# Patient Record
Sex: Male | Born: 1945 | Race: Black or African American | Hispanic: No | Marital: Married | Smoking: Never smoker
Health system: Southern US, Community
[De-identification: ages and names within clinical notes are randomized; demographics above are authoritative.]

## PROBLEM LIST (undated history)

## (undated) DIAGNOSIS — I10 Essential (primary) hypertension: Secondary | ICD-10-CM

## (undated) DIAGNOSIS — E785 Hyperlipidemia, unspecified: Secondary | ICD-10-CM

## (undated) DIAGNOSIS — K635 Polyp of colon: Secondary | ICD-10-CM

## (undated) DIAGNOSIS — D649 Anemia, unspecified: Secondary | ICD-10-CM

## (undated) DIAGNOSIS — K259 Gastric ulcer, unspecified as acute or chronic, without hemorrhage or perforation: Secondary | ICD-10-CM

## (undated) DIAGNOSIS — H269 Unspecified cataract: Secondary | ICD-10-CM

## (undated) DIAGNOSIS — Z5189 Encounter for other specified aftercare: Secondary | ICD-10-CM

## (undated) DIAGNOSIS — Z5111 Encounter for antineoplastic chemotherapy: Secondary | ICD-10-CM

## (undated) DIAGNOSIS — M5126 Other intervertebral disc displacement, lumbar region: Secondary | ICD-10-CM

## (undated) DIAGNOSIS — E119 Type 2 diabetes mellitus without complications: Secondary | ICD-10-CM

## (undated) DIAGNOSIS — M5136 Other intervertebral disc degeneration, lumbar region: Secondary | ICD-10-CM

## (undated) DIAGNOSIS — M51369 Other intervertebral disc degeneration, lumbar region without mention of lumbar back pain or lower extremity pain: Secondary | ICD-10-CM

## (undated) HISTORY — DX: Anemia, unspecified: D64.9

## (undated) HISTORY — DX: Other intervertebral disc displacement, lumbar region: M51.26

## (undated) HISTORY — PX: LIMBAL STEM CELL TRANSPLANT: SHX1969

## (undated) HISTORY — DX: Gastric ulcer, unspecified as acute or chronic, without hemorrhage or perforation: K25.9

## (undated) HISTORY — DX: Polyp of colon: K63.5

## (undated) HISTORY — DX: Encounter for other specified aftercare: Z51.89

## (undated) HISTORY — DX: Other intervertebral disc degeneration, lumbar region without mention of lumbar back pain or lower extremity pain: M51.369

## (undated) HISTORY — DX: Other intervertebral disc degeneration, lumbar region: M51.36

## (undated) HISTORY — DX: Hyperlipidemia, unspecified: E78.5

## (undated) HISTORY — DX: Encounter for antineoplastic chemotherapy: Z51.11

## (undated) HISTORY — DX: Unspecified cataract: H26.9

## (undated) HISTORY — DX: Essential (primary) hypertension: I10

## (undated) HISTORY — PX: CATARACT EXTRACTION W/ INTRAOCULAR LENS IMPLANT: SHX1309

## (undated) HISTORY — DX: Type 2 diabetes mellitus without complications: E11.9

---

## 2008-03-30 ENCOUNTER — Emergency Department (HOSPITAL_BASED_OUTPATIENT_CLINIC_OR_DEPARTMENT_OTHER): Admission: EM | Admit: 2008-03-30 | Discharge: 2008-03-30 | Payer: Self-pay | Admitting: Emergency Medicine

## 2009-01-20 HISTORY — PX: HUMERUS FRACTURE SURGERY: SHX670

## 2010-01-12 ENCOUNTER — Observation Stay (HOSPITAL_COMMUNITY)
Admission: EM | Admit: 2010-01-12 | Discharge: 2010-01-14 | Payer: Self-pay | Source: Home / Self Care | Attending: Internal Medicine | Admitting: Internal Medicine

## 2010-01-15 ENCOUNTER — Ambulatory Visit (HOSPITAL_BASED_OUTPATIENT_CLINIC_OR_DEPARTMENT_OTHER): Payer: Federal, State, Local not specified - PPO | Admitting: Internal Medicine

## 2010-01-17 ENCOUNTER — Inpatient Hospital Stay (HOSPITAL_COMMUNITY)
Admission: RE | Admit: 2010-01-17 | Discharge: 2010-01-19 | Payer: Self-pay | Source: Home / Self Care | Attending: Orthopedic Surgery | Admitting: Orthopedic Surgery

## 2010-01-18 ENCOUNTER — Encounter (INDEPENDENT_AMBULATORY_CARE_PROVIDER_SITE_OTHER): Payer: Self-pay | Admitting: Orthopedic Surgery

## 2010-01-20 DIAGNOSIS — K635 Polyp of colon: Secondary | ICD-10-CM

## 2010-01-20 HISTORY — DX: Polyp of colon: K63.5

## 2010-01-20 HISTORY — PX: LIMBAL STEM CELL TRANSPLANT: SHX1969

## 2010-01-22 LAB — CBC WITH DIFFERENTIAL/PLATELET
Basophils Absolute: 0 10*3/uL (ref 0.0–0.1)
EOS%: 1 % (ref 0.0–7.0)
Eosinophils Absolute: 0.1 10*3/uL (ref 0.0–0.5)
LYMPH%: 29.1 % (ref 14.0–49.0)
MCHC: 31.8 g/dL — ABNORMAL LOW (ref 32.0–36.0)
MCV: 76.6 fL — ABNORMAL LOW (ref 79.3–98.0)
MONO#: 0.6 10*3/uL (ref 0.1–0.9)
MONO%: 8.5 % (ref 0.0–14.0)
NEUT%: 61.3 % (ref 39.0–75.0)
Platelets: 216 10*3/uL (ref 140–400)

## 2010-01-23 LAB — COMPREHENSIVE METABOLIC PANEL
ALT: 18 U/L (ref 0–53)
AST: 20 U/L (ref 0–37)
Albumin: 2.9 g/dL — ABNORMAL LOW (ref 3.5–5.2)
Alkaline Phosphatase: 61 U/L (ref 39–117)
BUN: 14 mg/dL (ref 6–23)
CO2: 29 mEq/L (ref 19–32)
Calcium: 9.6 mg/dL (ref 8.4–10.5)
Chloride: 97 mEq/L (ref 96–112)
Creatinine, Ser: 1.06 mg/dL (ref 0.40–1.50)
Glucose, Bld: 168 mg/dL — ABNORMAL HIGH (ref 70–99)
Potassium: 4 mEq/L (ref 3.5–5.3)
Sodium: 138 mEq/L (ref 135–145)
Total Bilirubin: 0.7 mg/dL (ref 0.3–1.2)
Total Protein: 7.9 g/dL (ref 6.0–8.3)

## 2010-01-23 LAB — KAPPA/LAMBDA LIGHT CHAINS
Kappa free light chain: <0.66 mg/dL (ref 0.33–1.94)
Lambda Free Lght Chn: 27.8 mg/dL — ABNORMAL HIGH (ref 0.57–2.63)

## 2010-01-23 LAB — LACTATE DEHYDROGENASE: LDH: 151 U/L (ref 94–250)

## 2010-01-23 LAB — IGG, IGA, IGM
IgA: 3580 mg/dL — ABNORMAL HIGH (ref 68–378)
IgG (Immunoglobin G), Serum: 396 mg/dL — ABNORMAL LOW (ref 694–1618)
IgM, Serum: <4 mg/dL — ABNORMAL LOW (ref 60–263)

## 2010-01-23 LAB — BETA 2 MICROGLOBULIN, SERUM: Beta-2 Microglobulin: 5.23 mg/L — ABNORMAL HIGH (ref 1.01–1.73)

## 2010-01-25 ENCOUNTER — Other Ambulatory Visit
Admission: RE | Admit: 2010-01-25 | Discharge: 2010-01-25 | Payer: Self-pay | Source: Home / Self Care | Attending: Internal Medicine | Admitting: Internal Medicine

## 2010-01-25 LAB — CBC
HCT: 25 % — ABNORMAL LOW (ref 39.0–52.0)
Hemoglobin: 8 g/dL — ABNORMAL LOW (ref 13.0–17.0)
MCH: 25.2 pg — ABNORMAL LOW (ref 26.0–34.0)
MCHC: 32 g/dL (ref 30.0–36.0)
MCV: 78.9 fL (ref 78.0–100.0)
Platelets: 237 10*3/uL (ref 150–400)
RBC: 3.17 MIL/uL — ABNORMAL LOW (ref 4.22–5.81)
RDW: 17.9 % — ABNORMAL HIGH (ref 11.5–15.5)
WBC: 7.5 10*3/uL (ref 4.0–10.5)

## 2010-01-25 LAB — DIFFERENTIAL
Basophils Absolute: 0 10*3/uL (ref 0.0–0.1)
Basophils Relative: 0 % (ref 0–1)
Eosinophils Absolute: 0.1 10*3/uL (ref 0.0–0.7)
Eosinophils Relative: 1 % (ref 0–5)
Lymphocytes Relative: 34 % (ref 12–46)
Lymphs Abs: 2.5 10*3/uL (ref 0.7–4.0)
Monocytes Absolute: 0.8 10*3/uL (ref 0.1–1.0)
Monocytes Relative: 10 % (ref 3–12)
Neutro Abs: 4.1 10*3/uL (ref 1.7–7.7)
Neutrophils Relative %: 54 % (ref 43–77)

## 2010-01-29 LAB — UIFE/LIGHT CHAINS/TP QN, 24-HR UR
Albumin, U: DETECTED
Alpha 1, Urine: DETECTED — AB
Alpha 2, Urine: DETECTED — AB
Beta, Urine: DETECTED — AB
Free Kappa Lt Chains,Ur: 2.24 mg/dL — ABNORMAL HIGH (ref 0.04–1.51)
Free Kappa/Lambda Ratio: 0.02 ratio — ABNORMAL LOW (ref 0.46–4.00)
Free Lambda Excretion/Day: 1711.8 mg/d
Free Lambda Lt Chains,Ur: 95.1 mg/dL — ABNORMAL HIGH (ref 0.08–1.01)
Free Lt Chn Excr Rate: 40.32 mg/d
Gamma Globulin, Urine: DETECTED — AB
Time: 24 hours
Total Protein, Urine-Ur/day: 1762 mg/d — ABNORMAL HIGH (ref 10–140)
Total Protein, Urine: 97.9 mg/dL
Volume, Urine: 1800 mL

## 2010-01-31 LAB — CBC WITH DIFFERENTIAL/PLATELET
BASO%: 0.3 % (ref 0.0–2.0)
Basophils Absolute: 0 10*3/uL (ref 0.0–0.1)
EOS%: 0.3 % (ref 0.0–7.0)
Eosinophils Absolute: 0 10*3/uL (ref 0.0–0.5)
HCT: 26.9 % — ABNORMAL LOW (ref 38.4–49.9)
HGB: 8.5 g/dL — ABNORMAL LOW (ref 13.0–17.1)
LYMPH%: 25.4 % (ref 14.0–49.0)
MCH: 24.6 pg — ABNORMAL LOW (ref 27.2–33.4)
MCHC: 31.7 g/dL — ABNORMAL LOW (ref 32.0–36.0)
MCV: 77.8 fL — ABNORMAL LOW (ref 79.3–98.0)
MONO#: 0.4 10*3/uL (ref 0.1–0.9)
MONO%: 7.4 % (ref 0.0–14.0)
NEUT#: 4 10*3/uL (ref 1.5–6.5)
NEUT%: 66.6 % (ref 39.0–75.0)
Platelets: 305 10*3/uL (ref 140–400)
RBC: 3.46 10*6/uL — ABNORMAL LOW (ref 4.20–5.82)
RDW: 18.5 % — ABNORMAL HIGH (ref 11.0–14.6)
WBC: 6 10*3/uL (ref 4.0–10.3)
lymph#: 1.5 10*3/uL (ref 0.9–3.3)

## 2010-01-31 LAB — COMPREHENSIVE METABOLIC PANEL
ALT: 11 U/L (ref 0–53)
AST: 17 U/L (ref 0–37)
Albumin: 3.4 g/dL — ABNORMAL LOW (ref 3.5–5.2)
Alkaline Phosphatase: 82 U/L (ref 39–117)
BUN: 14 mg/dL (ref 6–23)
CO2: 29 mEq/L (ref 19–32)
Calcium: 12.9 mg/dL — ABNORMAL HIGH (ref 8.4–10.5)
Chloride: 99 mEq/L (ref 96–112)
Creatinine, Ser: 1.12 mg/dL (ref 0.40–1.50)
Glucose, Bld: 134 mg/dL — ABNORMAL HIGH (ref 70–99)
Potassium: 4.3 mEq/L (ref 3.5–5.3)
Sodium: 141 mEq/L (ref 135–145)
Total Bilirubin: 1 mg/dL (ref 0.3–1.2)
Total Protein: 9.5 g/dL — ABNORMAL HIGH (ref 6.0–8.3)

## 2010-01-31 LAB — LACTATE DEHYDROGENASE: LDH: 159 U/L (ref 94–250)

## 2010-02-04 ENCOUNTER — Encounter
Admission: AD | Admit: 2010-02-04 | Discharge: 2010-02-04 | Payer: Self-pay | Source: Home / Self Care | Attending: Dentistry | Admitting: Dentistry

## 2010-02-08 LAB — CBC WITH DIFFERENTIAL/PLATELET
BASO%: 0 % (ref 0.0–2.0)
Basophils Absolute: 0 10*3/uL (ref 0.0–0.1)
EOS%: 0.4 % (ref 0.0–7.0)
Eosinophils Absolute: 0 10*3/uL (ref 0.0–0.5)
HCT: 25.7 % — ABNORMAL LOW (ref 38.4–49.9)
HGB: 8.2 g/dL — ABNORMAL LOW (ref 13.0–17.1)
LYMPH%: 33.1 % (ref 14.0–49.0)
MCH: 24.2 pg — ABNORMAL LOW (ref 27.2–33.4)
MCHC: 31.9 g/dL — ABNORMAL LOW (ref 32.0–36.0)
MCV: 75.8 fL — ABNORMAL LOW (ref 79.3–98.0)
MONO#: 0.4 10*3/uL (ref 0.1–0.9)
MONO%: 7.3 % (ref 0.0–14.0)
NEUT#: 3.3 10*3/uL (ref 1.5–6.5)
NEUT%: 59.2 % (ref 39.0–75.0)
Platelets: 196 10*3/uL (ref 140–400)
RBC: 3.39 10*6/uL — ABNORMAL LOW (ref 4.20–5.82)
RDW: 18.1 % — ABNORMAL HIGH (ref 11.0–14.6)
WBC: 5.6 10*3/uL (ref 4.0–10.3)
lymph#: 1.9 10*3/uL (ref 0.9–3.3)

## 2010-02-15 NOTE — Op Note (Signed)
NAME:  Eddie Thomas, Eddie Thomas NO.:  [id number]  MEDICAL RECORD NO.:  [id number]          PATIENT TYPE:  INP  LOCATION:  5008                         FACILITY:  [facility]  PHYSICIAN:  Vania Rea. Alba Perillo, M.D.  DATE OF BIRTH:  [birth year]  DATE OF PROCEDURE:  01/17/2010 DATE OF DISCHARGE:                              OPERATIVE REPORT   PREOPERATIVE DIAGNOSIS:  Pathologic right proximal humerus fracture.  POSTOPERATIVE DIAGNOSIS:  Pathologic right proximal humerus fracture.  PROCEDURES: 1. Open reduction and internal fixation of pathological right proximal     humerus fracture. 2. Curettage and cement filling of bone defect, right proximal     humerus.  SURGEON:  Vania Rea. Raymonde Hamblin, MD  ASSISTANT:  Lucita Lora. Shuford, PA-C  ANESTHESIA:  General endotracheal as well as an interscalene block.  ESTIMATED BLOOD LOSS:  500 mL.  DRAINS:  Hemovac x1.  HISTORY:  Mr. Eddie Thomas is a 65 year old gentleman who sustained a pathologic fracture to the right proximal humerus this past Saturday. His injury occurred after trivial trauma to the shoulder and radiographs showed a large lytic lesion involving the proximal humeral metaphysis. Subsequent skeletal survey showed involvement of all long bones and changes consistent with multiple myeloma.  He is tentatively scheduled for evaluation and initial treatment with the Oncology Service early next week.  He is brought to the operating room at this time for planned surgical stabilization of his proximal humeral pathologic fracture.  Preoperatively, I counseled Mr. Eddie Thomas and his family on treatment options as well as risks versus benefits thereof.  Possible surgical complications were reviewed including potential for bleeding, infection, neurovascular injury, malunion, nonunion, failure of fixation, and possible need for additional surgery.  He understands and accepts and agrees with our planned procedure.  PROCEDURE IN DETAIL:  After  undergoing routine preop evaluation, the patient received prophylactic antibiotics.  An interscalene block was established in the holding area by the Anesthesia Department.  Placed supine on the operating table and underwent smooth induction of general endotracheal anesthesia.  The right shoulder girdle and right upper extremity was then sterilely prepped and draped in the standard fashion. Time-out was called.  An anterior and anterolateral approach to the shoulder and upper arm respectively was made beginning with an incision at the coracoid process and extending distally, total length approximately 20 cm.  Dissection was carried through skin and subcu tissue and electrocautery was used for hemostasis.  The deltopectoral interval was developed proximally with the cephalic vein retracted laterally with the deltoid.  Dissection was then carried distally.  The interval between the deltoid and pectoralis major developed distally and then onto the anterior aspect of the humerus where the biceps was retracted medially and the brachialis was identified and split in its midline along the anterior margin of the humeral shaft.  Subperiosteal dissection was then used to expose the humeral shaft for an appropriate distance distal to the pathologic fracture site.  The distal deltoid insertion was reflected posterolaterally.  The tumor was readily identified in the proximal humeral metaphyseal region with associated pathologic fracture.  Once we had complete exposure of the anterior and anterolateral aspects of  humerus both proximal and distal to the tumor and fracture, we obtained appropriate alignment and then selected the appropriate length Zimmer proximal humeral plate.  We confirmed this had appropriate length to span the bony defect and obtain appropriate purchase both proximally and distally.  We performed some initial transfixion with a single screw proximally and distally, confirmed overall  good alignment, then completed fixation with multiple locking screws into the humeral head proximally and then a combination of locking and regular lag screws distally obtaining a total of 12 cortices distally and 5 locking screws proximally into the humeral head.  We then created a bony window into the tumor anteriorly and this allowed [place] to threat out the tumor mass leaving a thin shell of residual bone.  Cement was then mixed and introduced into the bony void and hand packed it with fluoroscopic images used to confirm that the cement was maintained within the bony shell.  We then transfixed an additional 6 screws into the cement before it hardened and then obtained additional fixation with this technique.  Final fluoroscopic images showed the cement to be properly contained within the bony void of the tumor and all hardware was in good position and alignment.  The wound was then copiously irrigated.  Hemostasis was obtained.  A Hemovac drain was brought out distally and laterally.  The deltopectoral interval was then reapproximated with 0 Vicryl as was the distal portion of the incision. A 2-0 Vicryl was used for the subcu layer and intracuticular 3-0 Monocryl for the skin followed by Steri-Strips.  Dry dressing was applied over the right upper arm.  The right arm was placed in a sling. The patient was then awakened, extubated, and taken to recovery room in stable condition.     Vania Rea. Janica Eldred, M.D.     KMS/MEDQ  D:  01/17/2010  T:  01/18/2010  Job:  [id number]  Electronically Signed by Francena Hanly M.D. on 02/13/2010 07:43:48 AM

## 2010-02-20 ENCOUNTER — Ambulatory Visit (HOSPITAL_BASED_OUTPATIENT_CLINIC_OR_DEPARTMENT_OTHER): Payer: Federal, State, Local not specified - PPO | Admitting: Internal Medicine

## 2010-02-20 DIAGNOSIS — M949 Disorder of cartilage, unspecified: Secondary | ICD-10-CM

## 2010-02-20 DIAGNOSIS — C9000 Multiple myeloma not having achieved remission: Secondary | ICD-10-CM

## 2010-02-20 DIAGNOSIS — M899 Disorder of bone, unspecified: Secondary | ICD-10-CM

## 2010-02-20 DIAGNOSIS — D649 Anemia, unspecified: Secondary | ICD-10-CM

## 2010-02-20 LAB — CBC WITH DIFFERENTIAL/PLATELET
BASO%: 0 % (ref 0.0–2.0)
EOS%: 0.9 % (ref 0.0–7.0)
MCH: 24.5 pg — ABNORMAL LOW (ref 27.2–33.4)
MCHC: 32.6 g/dL (ref 32.0–36.0)
MCV: 75 fL — ABNORMAL LOW (ref 79.3–98.0)
NEUT#: 5.3 10*3/uL (ref 1.5–6.5)
RBC: 3.28 10*6/uL — ABNORMAL LOW (ref 4.20–5.82)
RDW: 19.4 % — ABNORMAL HIGH (ref 11.0–14.6)

## 2010-02-20 LAB — COMPREHENSIVE METABOLIC PANEL
Albumin: 3.7 g/dL (ref 3.5–5.2)
CO2: 24 mEq/L (ref 19–32)
Calcium: 7.8 mg/dL — ABNORMAL LOW (ref 8.4–10.5)
Chloride: 103 mEq/L (ref 96–112)
Glucose, Bld: 115 mg/dL — ABNORMAL HIGH (ref 70–99)
Potassium: 3.9 mEq/L (ref 3.5–5.3)
Sodium: 139 mEq/L (ref 135–145)

## 2010-03-06 ENCOUNTER — Other Ambulatory Visit: Payer: Self-pay | Admitting: Internal Medicine

## 2010-03-06 ENCOUNTER — Encounter (HOSPITAL_BASED_OUTPATIENT_CLINIC_OR_DEPARTMENT_OTHER): Payer: Federal, State, Local not specified - PPO | Admitting: Internal Medicine

## 2010-03-06 DIAGNOSIS — D649 Anemia, unspecified: Secondary | ICD-10-CM

## 2010-03-06 DIAGNOSIS — C9000 Multiple myeloma not having achieved remission: Secondary | ICD-10-CM

## 2010-03-06 DIAGNOSIS — M949 Disorder of cartilage, unspecified: Secondary | ICD-10-CM

## 2010-03-06 LAB — CBC WITH DIFFERENTIAL/PLATELET
Basophils Absolute: 0 10*3/uL (ref 0.0–0.1)
Eosinophils Absolute: 0.1 10*3/uL (ref 0.0–0.5)
HGB: 8.1 g/dL — ABNORMAL LOW (ref 13.0–17.1)
LYMPH%: 27.6 % (ref 14.0–49.0)
MCV: 72.8 fL — ABNORMAL LOW (ref 79.3–98.0)
MONO#: 0.6 10*3/uL (ref 0.1–0.9)
MONO%: 11.1 % (ref 0.0–14.0)
NEUT#: 3.4 10*3/uL (ref 1.5–6.5)
Platelets: 498 10*3/uL — ABNORMAL HIGH (ref 140–400)
RDW: 19.1 % — ABNORMAL HIGH (ref 11.0–14.6)
WBC: 5.6 10*3/uL (ref 4.0–10.3)

## 2010-03-06 LAB — COMPREHENSIVE METABOLIC PANEL
ALT: 13 U/L (ref 0–53)
BUN: 5 mg/dL — ABNORMAL LOW (ref 6–23)
CO2: 28 mEq/L (ref 19–32)
Creatinine, Ser: 0.7 mg/dL (ref 0.40–1.50)
Potassium: 3.8 mEq/L (ref 3.5–5.3)
Sodium: 142 mEq/L (ref 135–145)
Total Protein: 6.3 g/dL (ref 6.0–8.3)

## 2010-03-14 ENCOUNTER — Encounter (HOSPITAL_BASED_OUTPATIENT_CLINIC_OR_DEPARTMENT_OTHER): Payer: Federal, State, Local not specified - PPO | Admitting: Internal Medicine

## 2010-03-14 ENCOUNTER — Other Ambulatory Visit: Payer: Self-pay | Admitting: Internal Medicine

## 2010-03-14 DIAGNOSIS — C9000 Multiple myeloma not having achieved remission: Secondary | ICD-10-CM

## 2010-03-14 DIAGNOSIS — M949 Disorder of cartilage, unspecified: Secondary | ICD-10-CM

## 2010-03-14 DIAGNOSIS — M899 Disorder of bone, unspecified: Secondary | ICD-10-CM

## 2010-03-14 LAB — BASIC METABOLIC PANEL
BUN: 7 mg/dL (ref 6–23)
Calcium: 8.5 mg/dL (ref 8.4–10.5)
Chloride: 102 mEq/L (ref 96–112)
Creatinine, Ser: 0.78 mg/dL (ref 0.40–1.50)
Potassium: 4.2 mEq/L (ref 3.5–5.3)

## 2010-03-25 NOTE — Discharge Summary (Signed)
  NAME:  Eddie Thomas, Eddie Thomas NO.:  [id number]  MEDICAL RECORD NO.:  [id number]          PATIENT TYPE:  INP  LOCATION:  5008                         FACILITY:  [facility]  PHYSICIAN:  Vania Rea. Malina Geers, M.D.  DATE OF BIRTH:  11/30/1945  DATE OF ADMISSION:  01/17/2010 DATE OF DISCHARGE:  01/19/2010                              DISCHARGE SUMMARY   ADMISSION DIAGNOSES: 1. Pathologic fracture of right humerus. 2. Hypertension. 3. Non-insulin-dependant diabetes mellitus. 4. Hypercholesteremia.  DISCHARGE DIAGNOSES: 1. Status post open reduction and internal fixation of pathological     right proximal humerus fracture. 2. Probable multiple myeloma.  OPERATIONS:  Open reduction and internal fixation of right pathologic humerus fracture.  BRIEF HISTORY:  Eddie Thomas is a 64-year gentleman who sustained a pathologic fracture of right proximal humerus with minimal trauma. Radiograph showed lytic lesion of the right proximal humerus as well as subsequent skeletal survey shows involvement of all long bones consistent with multiple myeloma.  He was therefore scheduled for operative fixation of his right pathologic fracture as well as postoperative oncology referral.  HOSPITAL COURSE:  The patient was admitted and underwent the above-named procedure and tolerated this well.  All appropriate IV antibiotics and analgesia were utilized.  The patient did extremely well postoperatively, had initial postoperative pain as expected requiring additional stay in the hospital.  However, he remained neurovascularly intact.  By second day postoperatively, he was ambulating well.  He was tolerating oral pain meds and incision was clean and dry.  At this time, he was stable for discharge to home.  CONDITION ON DISCHARGE:  Stable, improved.  DISCHARGE MEDICATIONS AND PLANS:  The patient is being discharged to home.  He wears a sling at all times.  Follow up in our office in 2 weeks, call for  time.  Prescriptions for medications per med reconciliation are in the chart.  May allow arm to dangle.  Call for any other additional needs.  Resume other home meds and home diet.     Tracy A. Shuford, P.A.-C.   ______________________________ Vania Rea. Linzie Boursiquot, M.D.    TAS/MEDQ  D:  02/13/2010  T:  02/13/2010  Job:  [id number]  Electronically Signed by Ralene Bathe P.A.-C. on 03/06/2010 08:26:16 AM Electronically Signed by Francena Hanly M.D. on 03/25/2010 05:02:55 PM

## 2010-03-27 ENCOUNTER — Encounter (HOSPITAL_BASED_OUTPATIENT_CLINIC_OR_DEPARTMENT_OTHER): Payer: Federal, State, Local not specified - PPO | Admitting: Internal Medicine

## 2010-03-27 ENCOUNTER — Other Ambulatory Visit: Payer: Self-pay | Admitting: Internal Medicine

## 2010-03-27 DIAGNOSIS — C9000 Multiple myeloma not having achieved remission: Secondary | ICD-10-CM

## 2010-03-27 DIAGNOSIS — M899 Disorder of bone, unspecified: Secondary | ICD-10-CM

## 2010-03-27 LAB — CBC WITH DIFFERENTIAL/PLATELET
BASO%: 0.6 % (ref 0.0–2.0)
Basophils Absolute: 0 10*3/uL (ref 0.0–0.1)
EOS%: 5 % (ref 0.0–7.0)
Eosinophils Absolute: 0.4 10*3/uL (ref 0.0–0.5)
HGB: 9.7 g/dL — ABNORMAL LOW (ref 13.0–17.1)
MCHC: 32 g/dL (ref 32.0–36.0)
MCV: 73.1 fL — ABNORMAL LOW (ref 79.3–98.0)
MONO#: 0.9 10*3/uL (ref 0.1–0.9)
MONO%: 11.4 % (ref 0.0–14.0)
RDW: 19.1 % — ABNORMAL HIGH (ref 11.0–14.6)
lymph#: 1.2 10*3/uL (ref 0.9–3.3)

## 2010-03-28 LAB — COMPREHENSIVE METABOLIC PANEL
ALT: 12 U/L (ref 0–53)
AST: 11 U/L (ref 0–37)
Albumin: 4.2 g/dL (ref 3.5–5.2)
Alkaline Phosphatase: 138 U/L — ABNORMAL HIGH (ref 39–117)
Total Bilirubin: 1.8 mg/dL — ABNORMAL HIGH (ref 0.3–1.2)
Total Protein: 6.5 g/dL (ref 6.0–8.3)

## 2010-03-28 LAB — KAPPA/LAMBDA LIGHT CHAINS
Kappa free light chain: 0.61 mg/dL (ref 0.33–1.94)
Kappa:Lambda Ratio: 0.25 — ABNORMAL LOW (ref 0.26–1.65)
Lambda Free Lght Chn: 2.4 mg/dL (ref 0.57–2.63)

## 2010-03-28 LAB — IGG, IGA, IGM: IgG (Immunoglobin G), Serum: 635 mg/dL — ABNORMAL LOW (ref 694–1618)

## 2010-03-28 LAB — BETA 2 MICROGLOBULIN, SERUM: Beta-2 Microglobulin: 1.47 mg/L (ref 1.01–1.73)

## 2010-04-01 LAB — CROSSMATCH
ABO/RH(D): AB POS
ABO/RH(D): AB POS
Unit division: 0
Unit division: 0
Unit division: 0
Unit division: 0

## 2010-04-01 LAB — COMPREHENSIVE METABOLIC PANEL
ALT: 19 U/L (ref 0–53)
ALT: 23 U/L (ref 0–53)
AST: 31 U/L (ref 0–37)
AST: 28 U/L (ref 0–37)
Alkaline Phosphatase: 57 U/L (ref 39–117)
CO2: 28 mEq/L (ref 19–32)
Calcium: 8.8 mg/dL (ref 8.4–10.5)
Calcium: 9.9 mg/dL (ref 8.4–10.5)
Chloride: 102 mEq/L (ref 96–112)
Chloride: 99 mEq/L (ref 96–112)
Creatinine, Ser: 1 mg/dL (ref 0.4–1.5)
GFR calc Af Amer: >60 mL/min (ref >60)
GFR calc Af Amer: >60 mL/min (ref >60)
GFR calc non Af Amer: >60 mL/min (ref >60)
GFR calc non Af Amer: >60 mL/min (ref >60)
Glucose, Bld: 154 mg/dL — ABNORMAL HIGH (ref 70–99)
Glucose, Bld: 130 mg/dL — ABNORMAL HIGH (ref 70–99)
Sodium: 139 mEq/L (ref 135–145)
Total Bilirubin: 1.3 mg/dL — ABNORMAL HIGH (ref 0.3–1.2)

## 2010-04-01 LAB — GLUCOSE, CAPILLARY
Glucose-Capillary: 124 mg/dL — ABNORMAL HIGH (ref 70–99)
Glucose-Capillary: 133 mg/dL — ABNORMAL HIGH (ref 70–99)
Glucose-Capillary: 136 mg/dL — ABNORMAL HIGH (ref 70–99)
Glucose-Capillary: 175 mg/dL — ABNORMAL HIGH (ref 70–99)
Glucose-Capillary: 122 mg/dL — ABNORMAL HIGH (ref 70–99)
Glucose-Capillary: 140 mg/dL — ABNORMAL HIGH (ref 70–99)
Glucose-Capillary: 162 mg/dL — ABNORMAL HIGH (ref 70–99)
Glucose-Capillary: 98 mg/dL (ref 70–99)

## 2010-04-01 LAB — URINALYSIS, ROUTINE W REFLEX MICROSCOPIC
Bilirubin Urine: NEGATIVE
Glucose, UA: NEGATIVE mg/dL
Ketones, ur: NEGATIVE mg/dL
Leukocytes, UA: NEGATIVE
Protein, ur: 30 mg/dL — AB
pH: 6 (ref 5.0–8.0)

## 2010-04-01 LAB — CBC
HCT: 30.7 % — ABNORMAL LOW (ref 39.0–52.0)
HCT: 26.6 % — ABNORMAL LOW (ref 39.0–52.0)
HCT: 28.1 % — ABNORMAL LOW (ref 39.0–52.0)
HCT: 31.9 % — ABNORMAL LOW (ref 39.0–52.0)
Hemoglobin: 9.9 g/dL — ABNORMAL LOW (ref 13.0–17.0)
Hemoglobin: 10.5 g/dL — ABNORMAL LOW (ref 13.0–17.0)
Hemoglobin: 8.7 g/dL — ABNORMAL LOW (ref 13.0–17.0)
MCH: 23.1 pg — ABNORMAL LOW (ref 26.0–34.0)
MCH: 23.4 pg — ABNORMAL LOW (ref 26.0–34.0)
MCH: 24.3 pg — ABNORMAL LOW (ref 26.0–34.0)
MCH: 24.3 pg — ABNORMAL LOW (ref 26.0–34.0)
MCHC: 32.2 g/dL (ref 30.0–36.0)
MCHC: 32.4 g/dL (ref 30.0–36.0)
MCHC: 32.7 g/dL (ref 30.0–36.0)
MCHC: 32.9 g/dL (ref 30.0–36.0)
MCV: 75.2 fL — ABNORMAL LOW (ref 78.0–100.0)
MCV: 75.6 fL — ABNORMAL LOW (ref 78.0–100.0)
Platelets: 166 10*3/uL (ref 150–400)
Platelets: 182 10*3/uL (ref 150–400)
Platelets: 149 10*3/uL — ABNORMAL LOW (ref 150–400)
RBC: 2.99 MIL/uL — ABNORMAL LOW (ref 4.22–5.81)
RBC: 3.42 MIL/uL — ABNORMAL LOW (ref 4.22–5.81)
RBC: 4.32 MIL/uL (ref 4.22–5.81)
RDW: 15.4 % (ref 11.5–15.5)
RDW: 16.4 % — ABNORMAL HIGH (ref 11.5–15.5)
RDW: 17.2 % — ABNORMAL HIGH (ref 11.5–15.5)
WBC: 6.2 10*3/uL (ref 4.0–10.5)
WBC: 7.7 10*3/uL (ref 4.0–10.5)
WBC: 5.2 10*3/uL (ref 4.0–10.5)
WBC: 7.6 10*3/uL (ref 4.0–10.5)

## 2010-04-01 LAB — DIFFERENTIAL
Basophils Absolute: 0 10*3/uL (ref 0.0–0.1)
Eosinophils Absolute: 0 10*3/uL (ref 0.0–0.7)
Eosinophils Relative: 0 % (ref 0–5)
Lymphs Abs: 1.8 10*3/uL (ref 0.7–4.0)
Monocytes Absolute: 0.4 10*3/uL (ref 0.1–1.0)
Monocytes Relative: 5 % (ref 3–12)
Neutro Abs: 5.5 10*3/uL (ref 1.7–7.7)

## 2010-04-01 LAB — MULTIPLE MYELOMA PANEL, SERUM
Albumin ELP: 36 % — ABNORMAL LOW (ref 55.8–66.1)
Alpha-1-Globulin: 3.1 % (ref 2.9–4.9)
Alpha-2-Globulin: 8.1 % (ref 7.1–11.8)
Beta 2: 40 % — ABNORMAL HIGH (ref 3.2–6.5)
Beta Globulin: 4.3 % — ABNORMAL LOW (ref 4.7–7.2)
Gamma Globulin: 8.5 % — ABNORMAL LOW (ref 11.1–18.8)
IgA: 3680 mg/dL — ABNORMAL HIGH (ref 68–378)
IgG (Immunoglobin G), Serum: 415 mg/dL — ABNORMAL LOW (ref 694–1618)
IgM, Serum: 11 mg/dL — ABNORMAL LOW (ref 60–263)
M-Spike, %: 3.07 g/dL
Total Protein: 7.8 g/dL (ref 6.0–8.3)

## 2010-04-01 LAB — PROTIME-INR: Prothrombin Time: 14.1 seconds (ref 11.6–15.2)

## 2010-04-01 LAB — BASIC METABOLIC PANEL
BUN: 10 mg/dL (ref 6–23)
BUN: 9 mg/dL (ref 6–23)
CO2: 27 mEq/L (ref 19–32)
Calcium: 9 mg/dL (ref 8.4–10.5)
Calcium: 8.8 mg/dL (ref 8.4–10.5)
Creatinine, Ser: 0.93 mg/dL (ref 0.4–1.5)
Creatinine, Ser: 0.9 mg/dL (ref 0.4–1.5)
GFR calc Af Amer: >60 mL/min (ref >60)
GFR calc non Af Amer: >60 mL/min (ref >60)
Glucose, Bld: 152 mg/dL — ABNORMAL HIGH (ref 70–99)
Potassium: 4.4 mEq/L (ref 3.5–5.1)

## 2010-04-01 LAB — SURGICAL PCR SCREEN
MRSA, PCR: NEGATIVE
Staphylococcus aureus: NEGATIVE

## 2010-04-01 LAB — POCT I-STAT 4, (NA,K, GLUC, HGB,HCT)
HCT: 24 % — ABNORMAL LOW (ref 39.0–52.0)
Hemoglobin: 8.2 g/dL — ABNORMAL LOW (ref 13.0–17.0)
Sodium: 139 mEq/L (ref 135–145)

## 2010-04-01 LAB — URINE MICROSCOPIC-ADD ON

## 2010-04-01 LAB — ABO/RH: ABO/RH(D): AB POS

## 2010-04-01 LAB — APTT: aPTT: 29 seconds (ref 24–37)

## 2010-04-03 ENCOUNTER — Encounter (HOSPITAL_BASED_OUTPATIENT_CLINIC_OR_DEPARTMENT_OTHER): Payer: Federal, State, Local not specified - PPO | Admitting: Internal Medicine

## 2010-04-03 DIAGNOSIS — M899 Disorder of bone, unspecified: Secondary | ICD-10-CM

## 2010-04-03 DIAGNOSIS — C9000 Multiple myeloma not having achieved remission: Secondary | ICD-10-CM

## 2010-04-11 ENCOUNTER — Other Ambulatory Visit: Payer: Self-pay | Admitting: Internal Medicine

## 2010-04-11 ENCOUNTER — Encounter (HOSPITAL_BASED_OUTPATIENT_CLINIC_OR_DEPARTMENT_OTHER): Payer: Federal, State, Local not specified - PPO | Admitting: Internal Medicine

## 2010-04-11 DIAGNOSIS — C9000 Multiple myeloma not having achieved remission: Secondary | ICD-10-CM

## 2010-04-11 LAB — BASIC METABOLIC PANEL
Chloride: 103 mEq/L (ref 96–112)
Potassium: 3.6 mEq/L (ref 3.5–5.3)
Sodium: 138 mEq/L (ref 135–145)

## 2010-05-01 ENCOUNTER — Other Ambulatory Visit: Payer: Self-pay | Admitting: Internal Medicine

## 2010-05-01 ENCOUNTER — Encounter (HOSPITAL_BASED_OUTPATIENT_CLINIC_OR_DEPARTMENT_OTHER): Payer: Federal, State, Local not specified - PPO | Admitting: Internal Medicine

## 2010-05-01 DIAGNOSIS — R197 Diarrhea, unspecified: Secondary | ICD-10-CM

## 2010-05-01 DIAGNOSIS — C9000 Multiple myeloma not having achieved remission: Secondary | ICD-10-CM

## 2010-05-01 DIAGNOSIS — M899 Disorder of bone, unspecified: Secondary | ICD-10-CM

## 2010-05-01 LAB — CBC WITH DIFFERENTIAL/PLATELET
Basophils Absolute: 0 10*3/uL (ref 0.0–0.1)
Eosinophils Absolute: 0.2 10*3/uL (ref 0.0–0.5)
HCT: 30.1 % — ABNORMAL LOW (ref 38.4–49.9)
LYMPH%: 29.7 % (ref 14.0–49.0)
MCV: 69.8 fL — ABNORMAL LOW (ref 79.3–98.0)
MONO#: 1 10*3/uL — ABNORMAL HIGH (ref 0.1–0.9)
NEUT#: 3.2 10*3/uL (ref 1.5–6.5)
NEUT%: 50.6 % (ref 39.0–75.0)
Platelets: 217 10*3/uL (ref 140–400)
WBC: 6.2 10*3/uL (ref 4.0–10.3)

## 2010-05-01 LAB — COMPREHENSIVE METABOLIC PANEL
ALT: 14 U/L (ref 0–53)
BUN: 15 mg/dL (ref 6–23)
CO2: 26 mEq/L (ref 19–32)
Calcium: 9.4 mg/dL (ref 8.4–10.5)
Chloride: 105 mEq/L (ref 96–112)
Creatinine, Ser: 0.73 mg/dL (ref 0.40–1.50)
Glucose, Bld: 140 mg/dL — ABNORMAL HIGH (ref 70–99)

## 2010-05-01 LAB — LACTATE DEHYDROGENASE: LDH: 93 U/L — ABNORMAL LOW (ref 94–250)

## 2010-05-09 ENCOUNTER — Other Ambulatory Visit: Payer: Self-pay | Admitting: Internal Medicine

## 2010-05-09 ENCOUNTER — Encounter (HOSPITAL_BASED_OUTPATIENT_CLINIC_OR_DEPARTMENT_OTHER): Payer: Federal, State, Local not specified - PPO | Admitting: Internal Medicine

## 2010-05-09 DIAGNOSIS — C9000 Multiple myeloma not having achieved remission: Secondary | ICD-10-CM

## 2010-05-09 LAB — BASIC METABOLIC PANEL
Chloride: 103 mEq/L (ref 96–112)
Creatinine, Ser: 0.7 mg/dL (ref 0.40–1.50)
Potassium: 3.7 mEq/L (ref 3.5–5.3)
Sodium: 141 mEq/L (ref 135–145)

## 2010-05-22 ENCOUNTER — Other Ambulatory Visit: Payer: Self-pay | Admitting: Internal Medicine

## 2010-05-22 ENCOUNTER — Encounter (HOSPITAL_BASED_OUTPATIENT_CLINIC_OR_DEPARTMENT_OTHER): Payer: Federal, State, Local not specified - PPO | Admitting: Internal Medicine

## 2010-05-22 DIAGNOSIS — C9000 Multiple myeloma not having achieved remission: Secondary | ICD-10-CM

## 2010-05-22 DIAGNOSIS — M949 Disorder of cartilage, unspecified: Secondary | ICD-10-CM

## 2010-05-22 LAB — CBC WITH DIFFERENTIAL/PLATELET
Basophils Absolute: 0 10*3/uL (ref 0.0–0.1)
Eosinophils Absolute: 0.4 10*3/uL (ref 0.0–0.5)
HGB: 9.6 g/dL — ABNORMAL LOW (ref 13.0–17.1)
MCV: 71.8 fL — ABNORMAL LOW (ref 79.3–98.0)
MONO%: 15.8 % — ABNORMAL HIGH (ref 0.0–14.0)
NEUT#: 3.9 10*3/uL (ref 1.5–6.5)
RDW: 18.8 % — ABNORMAL HIGH (ref 11.0–14.6)

## 2010-05-23 LAB — COMPREHENSIVE METABOLIC PANEL
Albumin: 4.1 g/dL (ref 3.5–5.2)
BUN: 14 mg/dL (ref 6–23)
Calcium: 9 mg/dL (ref 8.4–10.5)
Chloride: 103 mEq/L (ref 96–112)
Glucose, Bld: 127 mg/dL — ABNORMAL HIGH (ref 70–99)
Potassium: 3.5 mEq/L (ref 3.5–5.3)

## 2010-05-23 LAB — IGG, IGA, IGM
IgA: 274 mg/dL (ref 68–378)
IgG (Immunoglobin G), Serum: 453 mg/dL — ABNORMAL LOW (ref 700–1600)

## 2010-05-23 LAB — LACTATE DEHYDROGENASE: LDH: 86 U/L — ABNORMAL LOW (ref 94–250)

## 2010-05-29 ENCOUNTER — Encounter (HOSPITAL_BASED_OUTPATIENT_CLINIC_OR_DEPARTMENT_OTHER): Payer: Federal, State, Local not specified - PPO | Admitting: Internal Medicine

## 2010-05-29 DIAGNOSIS — M899 Disorder of bone, unspecified: Secondary | ICD-10-CM

## 2010-05-29 DIAGNOSIS — R197 Diarrhea, unspecified: Secondary | ICD-10-CM

## 2010-05-29 DIAGNOSIS — C9000 Multiple myeloma not having achieved remission: Secondary | ICD-10-CM

## 2010-06-06 ENCOUNTER — Other Ambulatory Visit: Payer: Self-pay | Admitting: Internal Medicine

## 2010-06-06 ENCOUNTER — Encounter (HOSPITAL_BASED_OUTPATIENT_CLINIC_OR_DEPARTMENT_OTHER): Payer: Federal, State, Local not specified - PPO | Admitting: Internal Medicine

## 2010-06-06 DIAGNOSIS — C9000 Multiple myeloma not having achieved remission: Secondary | ICD-10-CM

## 2010-06-06 LAB — BASIC METABOLIC PANEL
BUN: 10 mg/dL (ref 6–23)
Chloride: 105 mEq/L (ref 96–112)
Glucose, Bld: 96 mg/dL (ref 70–99)
Potassium: 3.8 mEq/L (ref 3.5–5.3)

## 2010-06-14 ENCOUNTER — Ambulatory Visit (HOSPITAL_COMMUNITY): Payer: Federal, State, Local not specified - PPO | Attending: Internal Medicine

## 2010-06-14 ENCOUNTER — Other Ambulatory Visit: Payer: Self-pay | Admitting: Internal Medicine

## 2010-06-14 DIAGNOSIS — C9000 Multiple myeloma not having achieved remission: Secondary | ICD-10-CM

## 2010-06-14 LAB — GLUCOSE, CAPILLARY: Glucose-Capillary: 111 mg/dL — ABNORMAL HIGH (ref 70–99)

## 2010-06-14 LAB — BONE MARROW EXAM: Bone Marrow Exam: 366

## 2010-07-04 ENCOUNTER — Encounter (HOSPITAL_BASED_OUTPATIENT_CLINIC_OR_DEPARTMENT_OTHER): Payer: Federal, State, Local not specified - PPO | Admitting: Internal Medicine

## 2010-07-04 ENCOUNTER — Other Ambulatory Visit: Payer: Self-pay | Admitting: Internal Medicine

## 2010-07-04 DIAGNOSIS — M949 Disorder of cartilage, unspecified: Secondary | ICD-10-CM

## 2010-07-04 DIAGNOSIS — C9000 Multiple myeloma not having achieved remission: Secondary | ICD-10-CM

## 2010-07-04 DIAGNOSIS — R197 Diarrhea, unspecified: Secondary | ICD-10-CM

## 2010-07-04 LAB — COMPREHENSIVE METABOLIC PANEL
BUN: 9 mg/dL (ref 6–23)
CO2: 26 mEq/L (ref 19–32)
Creatinine, Ser: 0.71 mg/dL (ref 0.50–1.35)
Glucose, Bld: 123 mg/dL — ABNORMAL HIGH (ref 70–99)
Total Bilirubin: 2.1 mg/dL — ABNORMAL HIGH (ref 0.3–1.2)

## 2010-07-04 LAB — CBC WITH DIFFERENTIAL/PLATELET
Basophils Absolute: 0.1 10*3/uL (ref 0.0–0.1)
EOS%: 4.4 % (ref 0.0–7.0)
Eosinophils Absolute: 0.3 10*3/uL (ref 0.0–0.5)
HGB: 9.6 g/dL — ABNORMAL LOW (ref 13.0–17.1)
MCH: 22.3 pg — ABNORMAL LOW (ref 27.2–33.4)
MONO%: 23.5 % — ABNORMAL HIGH (ref 0.0–14.0)
NEUT#: 2.6 10*3/uL (ref 1.5–6.5)
RBC: 4.31 10*6/uL (ref 4.20–5.82)
RDW: 18.5 % — ABNORMAL HIGH (ref 11.0–14.6)
lymph#: 1.7 10*3/uL (ref 0.9–3.3)
nRBC: 0 % (ref 0–0)

## 2010-07-04 LAB — LACTATE DEHYDROGENASE: LDH: 163 U/L (ref 94–250)

## 2010-07-29 ENCOUNTER — Other Ambulatory Visit: Payer: Self-pay | Admitting: Internal Medicine

## 2010-07-29 DIAGNOSIS — Z8579 Personal history of other malignant neoplasms of lymphoid, hematopoietic and related tissues: Secondary | ICD-10-CM

## 2010-07-30 ENCOUNTER — Ambulatory Visit (HOSPITAL_COMMUNITY)
Admission: RE | Admit: 2010-07-30 | Discharge: 2010-07-30 | Disposition: A | Discharge status: Home / Self Care | Payer: Federal, State, Local not specified - PPO | Source: Ambulatory Visit | Attending: Internal Medicine | Admitting: Internal Medicine

## 2010-07-30 ENCOUNTER — Other Ambulatory Visit: Payer: Self-pay | Admitting: Internal Medicine

## 2010-07-30 ENCOUNTER — Encounter (HOSPITAL_COMMUNITY)
Admission: RE | Admit: 2010-07-30 | Discharge: 2010-07-30 | Disposition: A | Discharge status: Home / Self Care | Payer: Federal, State, Local not specified - PPO | Source: Ambulatory Visit | Attending: Internal Medicine | Admitting: Internal Medicine

## 2010-07-30 DIAGNOSIS — C9000 Multiple myeloma not having achieved remission: Secondary | ICD-10-CM | POA: Insufficient documentation

## 2010-07-30 DIAGNOSIS — Z7982 Long term (current) use of aspirin: Secondary | ICD-10-CM | POA: Insufficient documentation

## 2010-07-30 DIAGNOSIS — Z8579 Personal history of other malignant neoplasms of lymphoid, hematopoietic and related tissues: Secondary | ICD-10-CM

## 2010-07-30 DIAGNOSIS — I10 Essential (primary) hypertension: Secondary | ICD-10-CM | POA: Insufficient documentation

## 2010-07-30 DIAGNOSIS — E119 Type 2 diabetes mellitus without complications: Secondary | ICD-10-CM | POA: Insufficient documentation

## 2010-07-30 DIAGNOSIS — Z01811 Encounter for preprocedural respiratory examination: Secondary | ICD-10-CM | POA: Insufficient documentation

## 2010-07-30 DIAGNOSIS — Z79899 Other long term (current) drug therapy: Secondary | ICD-10-CM | POA: Insufficient documentation

## 2010-07-30 MED ORDER — SODIUM PERTECHNETATE TC 99M INJECTION
26.9000 | Freq: Once | INTRAVENOUS | Status: AC | PRN
  Administered 2010-07-30: 26.9 via INTRAVENOUS

## 2010-07-30 MED ORDER — TECHNETIUM TC 99M-LABELED RED BLOOD CELLS IV KIT
26.9000 | PACK | Freq: Once | INTRAVENOUS | Status: AC | PRN
  Administered 2010-07-30: 26.9 via INTRAVENOUS

## 2010-08-01 ENCOUNTER — Other Ambulatory Visit: Payer: Self-pay | Admitting: Internal Medicine

## 2010-08-01 ENCOUNTER — Encounter (HOSPITAL_BASED_OUTPATIENT_CLINIC_OR_DEPARTMENT_OTHER): Payer: Federal, State, Local not specified - PPO | Admitting: Internal Medicine

## 2010-08-01 DIAGNOSIS — C9000 Multiple myeloma not having achieved remission: Secondary | ICD-10-CM

## 2010-08-01 DIAGNOSIS — R197 Diarrhea, unspecified: Secondary | ICD-10-CM

## 2010-08-01 DIAGNOSIS — M949 Disorder of cartilage, unspecified: Secondary | ICD-10-CM

## 2010-08-01 LAB — CBC WITH DIFFERENTIAL/PLATELET
Eosinophils Absolute: 0.1 10*3/uL (ref 0.0–0.5)
HCT: 30.1 % — ABNORMAL LOW (ref 38.4–49.9)
HGB: 9.6 g/dL — ABNORMAL LOW (ref 13.0–17.1)
LYMPH%: 29 % (ref 14.0–49.0)
MONO#: 0.9 10*3/uL (ref 0.1–0.9)
NEUT#: 2.6 10*3/uL (ref 1.5–6.5)
NEUT%: 51 % (ref 39.0–75.0)
Platelets: 217 10*3/uL (ref 140–400)
WBC: 5.1 10*3/uL (ref 4.0–10.3)

## 2010-08-01 LAB — COMPREHENSIVE METABOLIC PANEL
CO2: 28 mEq/L (ref 19–32)
Glucose, Bld: 111 mg/dL — ABNORMAL HIGH (ref 70–99)
Sodium: 141 mEq/L (ref 135–145)
Total Bilirubin: 1.9 mg/dL — ABNORMAL HIGH (ref 0.3–1.2)
Total Protein: 6 g/dL (ref 6.0–8.3)

## 2010-08-01 LAB — LACTATE DEHYDROGENASE: LDH: 112 U/L (ref 94–250)

## 2010-08-28 ENCOUNTER — Emergency Department (INDEPENDENT_AMBULATORY_CARE_PROVIDER_SITE_OTHER): Payer: Medicare Other

## 2010-08-28 ENCOUNTER — Other Ambulatory Visit: Payer: Self-pay

## 2010-08-28 ENCOUNTER — Encounter: Payer: Self-pay | Admitting: *Deleted

## 2010-08-28 ENCOUNTER — Emergency Department (HOSPITAL_BASED_OUTPATIENT_CLINIC_OR_DEPARTMENT_OTHER)
Admission: EM | Admit: 2010-08-28 | Discharge: 2010-08-28 | Disposition: A | Discharge status: Home / Self Care | Payer: Medicare Other | Attending: Emergency Medicine | Admitting: Emergency Medicine

## 2010-08-28 DIAGNOSIS — R079 Chest pain, unspecified: Secondary | ICD-10-CM

## 2010-08-28 DIAGNOSIS — R42 Dizziness and giddiness: Secondary | ICD-10-CM

## 2010-08-28 DIAGNOSIS — R55 Syncope and collapse: Secondary | ICD-10-CM | POA: Insufficient documentation

## 2010-08-28 DIAGNOSIS — Z87898 Personal history of other specified conditions: Secondary | ICD-10-CM | POA: Insufficient documentation

## 2010-08-28 DIAGNOSIS — E119 Type 2 diabetes mellitus without complications: Secondary | ICD-10-CM | POA: Insufficient documentation

## 2010-08-28 LAB — BASIC METABOLIC PANEL
CO2: 26 mEq/L (ref 19–32)
Calcium: 8.7 mg/dL (ref 8.4–10.5)
GFR calc Af Amer: >60 mL/min (ref >60)
GFR calc non Af Amer: >60 mL/min (ref >60)
Sodium: 142 mEq/L (ref 135–145)

## 2010-08-28 LAB — GLUCOSE, CAPILLARY: Glucose-Capillary: 111 mg/dL — ABNORMAL HIGH (ref 70–99)

## 2010-08-28 LAB — TROPONIN I: Troponin I: <0.3 ng/mL (ref <0.30)

## 2010-08-28 NOTE — ED Provider Notes (Signed)
History     CSN: [id number] Arrival date & time: 08/28/2010 11:02 AM  Chief Complaint  Patient presents with  . Chest Pain   Patient is a 65 y.o. male presenting with syncope. The history is provided by the patient and the spouse. No language interpreter was used.  Loss of Consciousness This is a new problem. The current episode started 1 to 2 hours ago. The problem has not changed since onset.Pertinent negatives include no chest pain, no abdominal pain, no headaches and no shortness of breath. The symptoms are aggravated by nothing. The symptoms are relieved by lying down. He has tried nothing for the symptoms. The treatment provided significant relief.   Patient is a 65 year old male who presents today after having a presyncopal event. Patient has a right tunneled subclavian catheter in place for multiple myeloma therapies. Patient is pending a stem cell transplant. Today when his wife was flushing his port the patient suddenly became lightheaded and diaphoretic. Patient had not eaten anything today. By the time he arrived here patient had no symptoms. He denied any associated chest pain. He is no history of pulmonary emboli or DVT. Patient has no history of coronary artery disease. Patient has been feeling well. Patient has never had this occur when his wife is performed the same procedure in the past. He has had no episodes of vasovagal responses in the past. He denies any fevers and is hemodynamically stable on arrival. Past Medical History  Diagnosis Date  . Multiple myeloma   . Diabetes mellitus     History reviewed. No pertinent past surgical history.  History reviewed. No pertinent family history.  History  Substance Use Topics  . Smoking status: Never Smoker   . Smokeless tobacco: Not on file  . Alcohol Use: No      Review of Systems  Constitutional: Negative.   HENT: Negative.   Eyes: Negative.   Respiratory: Negative.  Negative for shortness of breath.     Cardiovascular: Positive for syncope. Negative for chest pain.  Gastrointestinal: Negative.  Negative for abdominal pain.  Genitourinary: Negative.   Musculoskeletal: Negative.   Skin: Negative.   Neurological: Positive for light-headedness. Negative for headaches.       Presyncope  Hematological: Negative.   Psychiatric/Behavioral: Negative.   All other systems reviewed and are negative.    Physical Exam  BP 113/59  Pulse 61  Temp(Src) 98.4 F (36.9 C) (Oral)  Resp 18  Ht 6\' 1"  (1.854 m)  Wt 155 lb (70.308 kg)  BMI 20.45 kg/m2  SpO2 100%  Physical Exam  Constitutional: He is oriented to person, place, and time. He appears well-developed. He appears cachectic. No distress.  HENT:  Head: Normocephalic and atraumatic.  Eyes: Conjunctivae and EOM are normal. Pupils are equal, round, and reactive to light.  Neck: Normal range of motion.  Cardiovascular: Normal rate and regular rhythm.  Exam reveals no gallop and no friction rub.   No murmur heard. Pulmonary/Chest: Effort normal and breath sounds normal. No respiratory distress. He has no wheezes. He has no rales. He exhibits no tenderness.       R tunneled SCV line is well maintained with no erythema surrounding it.  Abdominal: Soft. Bowel sounds are normal. He exhibits no distension. There is no tenderness. There is no rebound and no guarding.  Musculoskeletal: Normal range of motion. He exhibits no edema and no tenderness.  Neurological: He is alert and oriented to person, place, and time. No cranial nerve deficit. Coordination  normal.  Skin: Skin is warm. No rash noted. No erythema.  Psychiatric: He has a normal mood and affect.    Date: 08/28/2010  Rate: 64   Rhythm: normal sinus rhythm and premature atrial contractions (PAC)  QRS Axis: normal  Intervals: normal  ST/T Wave abnormalities: normal  Conduction Disutrbances:none  Narrative Interpretation:   Old EKG Reviewed: increased sinus arrythmia  ED Course   Procedures  MDM Patient was examined and was completely asymptomatic and hemodynamically stable on presentation. CBG was within normal limits. Patient did have a workup given his history of presyncope. Medical history was consistent with a vasovagal episode. Patient had CT head as well as chest x-ray that were negative. His labs were reviewed and though he did have neutropenia as well as anemia and thrombocytopenia these are known abnormalities for this patient. Patient's hemoglobin was 9.1. EKG was also unremarkable. Given the patient did not have any chest pain or shortness of breath associated with this and that he nothing has had low suspicion for pulmonary embolus. I discussed the findings with the patient as well as the likelihood of a vasovagal event. They were comfortable plan for discharge home. Patient will follow up with his primary care physician and return if he has any other changes.  Assessment: 65 year old male with history of multiple myeloma who presents today after a presyncopal event that occurred following flushing of his central catheter likely secondary to of vasovagal response.  Plan: As per above.      Cyndra Numbers, MD 08/28/10 1719

## 2010-08-28 NOTE — ED Notes (Signed)
Pt taken to room 10 from registration in w/c by rt, placed on neutropenic pcx, ekg performed at bedside by rt.  Pt reports to this rn that he had one episode of mid sternal chest " pressure " immediately after flushing his portacath, pressure "is still there but feels much better, almost back to normal..."  Pt states he also became diaphoretic and dizzy at that time, none now.

## 2010-08-29 ENCOUNTER — Telehealth (HOSPITAL_BASED_OUTPATIENT_CLINIC_OR_DEPARTMENT_OTHER): Payer: Self-pay

## 2010-08-29 LAB — CBC
MCV: 69.2 fL — ABNORMAL LOW (ref 78.0–100.0)
Platelets: 94 10*3/uL — ABNORMAL LOW (ref 150–400)
RBC: 3.99 MIL/uL — ABNORMAL LOW (ref 4.22–5.81)
WBC: 39.7 10*3/uL — ABNORMAL HIGH (ref 4.0–10.5)

## 2010-08-29 NOTE — ED Notes (Addendum)
After RN contacted patient, he called his oncologist in [facility] and he, in turn, called Dr Dierdre Highman back and told him that a WBC of 39,000 would be right where it needed to be following stem cell implant/ pt does not need to come back in and will follow up with PCP as scheduled

## 2010-11-06 ENCOUNTER — Encounter (HOSPITAL_BASED_OUTPATIENT_CLINIC_OR_DEPARTMENT_OTHER): Payer: Federal, State, Local not specified - PPO | Admitting: Internal Medicine

## 2010-11-06 ENCOUNTER — Other Ambulatory Visit: Payer: Self-pay | Admitting: Internal Medicine

## 2010-11-06 DIAGNOSIS — C9000 Multiple myeloma not having achieved remission: Secondary | ICD-10-CM

## 2010-11-06 DIAGNOSIS — D649 Anemia, unspecified: Secondary | ICD-10-CM

## 2010-11-06 LAB — CBC WITH DIFFERENTIAL/PLATELET
Eosinophils Absolute: 0.1 10*3/uL (ref 0.0–0.5)
MONO#: 0.9 10*3/uL (ref 0.1–0.9)
NEUT#: 2.2 10*3/uL (ref 1.5–6.5)
RBC: 4.63 10*6/uL (ref 4.20–5.82)
RDW: 20.7 % — ABNORMAL HIGH (ref 11.0–14.6)
WBC: 5.1 10*3/uL (ref 4.0–10.3)
lymph#: 1.9 10*3/uL (ref 0.9–3.3)

## 2010-11-06 LAB — COMPREHENSIVE METABOLIC PANEL
ALT: 9 U/L (ref 0–53)
Albumin: 3.9 g/dL (ref 3.5–5.2)
CO2: 27 mEq/L (ref 19–32)
Glucose, Bld: 102 mg/dL — ABNORMAL HIGH (ref 70–99)
Potassium: 4 mEq/L (ref 3.5–5.3)
Sodium: 141 mEq/L (ref 135–145)
Total Protein: 5.7 g/dL — ABNORMAL LOW (ref 6.0–8.3)

## 2010-11-06 LAB — LACTATE DEHYDROGENASE: LDH: 168 U/L (ref 94–250)

## 2010-11-30 ENCOUNTER — Encounter: Payer: Self-pay | Admitting: *Deleted

## 2010-12-02 ENCOUNTER — Other Ambulatory Visit: Payer: Self-pay | Admitting: Internal Medicine

## 2010-12-02 DIAGNOSIS — C9000 Multiple myeloma not having achieved remission: Secondary | ICD-10-CM

## 2010-12-04 ENCOUNTER — Ambulatory Visit: Payer: Medicare Other

## 2010-12-04 ENCOUNTER — Other Ambulatory Visit: Payer: Medicare Other | Admitting: Lab

## 2010-12-04 ENCOUNTER — Other Ambulatory Visit: Payer: Self-pay | Admitting: Internal Medicine

## 2010-12-04 ENCOUNTER — Ambulatory Visit (HOSPITAL_BASED_OUTPATIENT_CLINIC_OR_DEPARTMENT_OTHER): Payer: Medicare Other

## 2010-12-04 DIAGNOSIS — C9000 Multiple myeloma not having achieved remission: Secondary | ICD-10-CM

## 2010-12-04 LAB — BASIC METABOLIC PANEL
Calcium: 8.7 mg/dL (ref 8.4–10.5)
Glucose, Bld: 132 mg/dL — ABNORMAL HIGH (ref 70–99)
Potassium: 4.2 mEq/L (ref 3.5–5.3)
Sodium: 138 mEq/L (ref 135–145)

## 2010-12-04 MED ORDER — SODIUM CHLORIDE 0.9 % IV SOLN
Freq: Once | INTRAVENOUS | Status: AC
  Administered 2010-12-04: 10:00:00 via INTRAVENOUS

## 2010-12-04 MED ORDER — ZOLEDRONIC ACID 4 MG/100ML IV SOLN
4.0000 mg | Freq: Once | INTRAVENOUS | Status: AC
  Administered 2010-12-04: 4 mg via INTRAVENOUS
  Filled 2010-12-04: qty 100

## 2010-12-13 ENCOUNTER — Telehealth: Payer: Self-pay | Admitting: Internal Medicine

## 2010-12-13 NOTE — Telephone Encounter (Signed)
Moved 11/28 appt to 11/27 @ 9 am due to MM overbooked. S/w pt today re change w/new d/t for 11/27 @ 9 am.

## 2010-12-17 ENCOUNTER — Other Ambulatory Visit: Payer: Self-pay | Admitting: Internal Medicine

## 2010-12-17 ENCOUNTER — Ambulatory Visit (HOSPITAL_BASED_OUTPATIENT_CLINIC_OR_DEPARTMENT_OTHER): Payer: Medicare Other | Admitting: Internal Medicine

## 2010-12-17 ENCOUNTER — Other Ambulatory Visit (HOSPITAL_BASED_OUTPATIENT_CLINIC_OR_DEPARTMENT_OTHER): Payer: Medicare Other | Admitting: Lab

## 2010-12-17 ENCOUNTER — Telehealth: Payer: Self-pay | Admitting: Internal Medicine

## 2010-12-17 DIAGNOSIS — M25519 Pain in unspecified shoulder: Secondary | ICD-10-CM

## 2010-12-17 DIAGNOSIS — C9000 Multiple myeloma not having achieved remission: Secondary | ICD-10-CM

## 2010-12-17 LAB — COMPREHENSIVE METABOLIC PANEL
CO2: 25 mEq/L (ref 19–32)
Calcium: 9.3 mg/dL (ref 8.4–10.5)
Chloride: 104 mEq/L (ref 96–112)
Creatinine, Ser: 0.89 mg/dL (ref 0.50–1.35)
Glucose, Bld: 112 mg/dL — ABNORMAL HIGH (ref 70–99)
Total Bilirubin: 1 mg/dL (ref 0.3–1.2)
Total Protein: 6.2 g/dL (ref 6.0–8.3)

## 2010-12-17 LAB — CBC WITH DIFFERENTIAL/PLATELET
Basophils Absolute: 0.1 10*3/uL (ref 0.0–0.1)
Eosinophils Absolute: 0.1 10*3/uL (ref 0.0–0.5)
HCT: 33.9 % — ABNORMAL LOW (ref 38.4–49.9)
HGB: 10.8 g/dL — ABNORMAL LOW (ref 13.0–17.1)
LYMPH%: 35.6 % (ref 14.0–49.0)
MONO#: 0.4 10*3/uL (ref 0.1–0.9)
NEUT#: 2.5 10*3/uL (ref 1.5–6.5)
NEUT%: 51.1 % (ref 39.0–75.0)
Platelets: 202 10*3/uL (ref 140–400)
WBC: 4.8 10*3/uL (ref 4.0–10.3)
lymph#: 1.7 10*3/uL (ref 0.9–3.3)

## 2010-12-17 LAB — LACTATE DEHYDROGENASE: LDH: 139 U/L (ref 94–250)

## 2010-12-17 NOTE — Telephone Encounter (Signed)
gve the pt his dec 2012 appt calendar °

## 2010-12-17 NOTE — Progress Notes (Signed)
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  PRIOR THERAPY: :   1. Status post 6 cycles of systemic chemotherapy with Revlimid and Decadron, last dose was given 07/21/2010 with very good response. 2. 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 Lance Bosch.  CURRENT THERAPY: Observation.  INTERVAL HISTORY: Eddie Thomas. 64 y.o. male returns to the clinic today for followup visit. The patient is doing fine today he has no specific complaints except for pain on the left shoulder area. He has x-ray performed at [facility] but he doesn't know the results of this x-ray. He denied having any fever or chills, no nausea or vomiting, no chest pain or shortness breath. He is still on treatment with Valtrex for HSV/VZV prophylaxis. He is currently off Bactrim secondary to presence of 2+ schistocytes on his peripheral blood smear. He is here today for repeat blood work and evaluation. He is scheduled for his monthly Zometa next month.  MEDICAL HISTORY: Past Medical History  Diagnosis Date  . Multiple myeloma   . Diabetes mellitus     ALLERGIES:   has no known allergies.  MEDICATIONS:  Current Outpatient Prescriptions  Medication Sig Dispense Refill  . ezetimibe (ZETIA) 10 MG tablet Take 10 mg by mouth daily.        [name] lisinopril (PRINIVIL,ZESTRIL) 10 MG tablet Take 10 mg by mouth daily.        . metFORMIN (GLUCOPHAGE) 500 MG tablet Take 500 mg by mouth 2 (two) times daily with a meal.        . simvastatin (ZOCOR) 10 MG tablet Take 10 mg by mouth at bedtime.          REVIEW OF SYSTEMS:  A comprehensive review of systems was negative.   PHYSICAL EXAMINATION: General appearance: alert, cooperative and no distress Head: Normocephalic, without obvious abnormality, atraumatic Neck:  no adenopathy Lymph nodes: Cervical, supraclavicular, and axillary nodes normal. Resp: clear to auscultation bilaterally Cardio: regular rate and rhythm, S1, S2 normal, no murmur, click, rub or gallop GI: soft, non-tender; bowel sounds normal; no masses,  no organomegaly Extremities: extremities normal, atraumatic, no cyanosis or edema Neurologic: Alert and oriented X 3, normal strength and tone. Normal symmetric reflexes. Normal coordination and gait  ECOG PERFORMANCE STATUS: 1 - Symptomatic but completely ambulatory  Blood pressure 128/73, pulse 64, temperature 97.9 F (36.6 C), temperature source Oral, height 6\' 1"  (1.854 m), weight 159 lb 9.6 oz (72.394 kg).  LABORATORY DATA: Lab Results  Component Value Date   WBC 4.8 12/17/2010   HGB 10.8* 12/17/2010   HCT 33.9* 12/17/2010   MCV 74.7* 12/17/2010   PLT 202 12/17/2010      Chemistry      Component Value Date/Time   NA 138 12/04/2010 0939   NA 138 12/04/2010 0939   K 4.2 12/04/2010 0939   K 4.2 12/04/2010 0939   CL 103 12/04/2010 0939   CL 103 12/04/2010 0939   CO2 27 12/04/2010 0939   CO2 27 12/04/2010 0939   BUN 9 12/04/2010 0939   BUN 9 12/04/2010 0939   CREATININE 0.76 12/04/2010 0939   CREATININE 0.76 12/04/2010 0939      Component Value Date/Time   CALCIUM 8.7 12/04/2010 0939   CALCIUM 8.7 12/04/2010 0939   ALKPHOS 50 11/06/2010 0850  AST 14 11/06/2010 0850   ALT 9 11/06/2010 0850   BILITOT 1.1 11/06/2010 0850     ASSESSMENT: This is a very pleasant 65 years old [place] American man with multiple myeloma status post treatment with Revlimid and Decadron followed by peripheral blood autologous stem cell transplant. The patient is doing fine and he is currently on observation. He is scheduled to see Dr. Lance Bosch for evaluation next month. I discussed with the patient and consideration of maintenance treatment with Revlimid and the expected adverse effect including secondary malignancy. He would like to wait until  you talked to Dr. Lance Bosch and make a decision regarding maintenance therapy  PLAN: I would see him back for followup visit in one month with repeat CBC CMET and LDH and addition we'll continue on his monthly Zometa as scheduled.  All questions were answered. The patient knows to call the clinic with any problems, questions or concerns. We can certainly see the patient much sooner if necessary.

## 2010-12-18 ENCOUNTER — Other Ambulatory Visit: Payer: Medicare Other | Admitting: Lab

## 2010-12-18 ENCOUNTER — Ambulatory Visit: Payer: Medicare Other | Admitting: Internal Medicine

## 2011-01-01 ENCOUNTER — Ambulatory Visit (HOSPITAL_BASED_OUTPATIENT_CLINIC_OR_DEPARTMENT_OTHER): Payer: Medicare Other

## 2011-01-01 ENCOUNTER — Telehealth: Payer: Self-pay | Admitting: Internal Medicine

## 2011-01-01 ENCOUNTER — Other Ambulatory Visit: Payer: Medicare Other | Admitting: Lab

## 2011-01-01 DIAGNOSIS — C9000 Multiple myeloma not having achieved remission: Secondary | ICD-10-CM

## 2011-01-01 MED ORDER — SODIUM CHLORIDE 0.9 % IV SOLN
Freq: Once | INTRAVENOUS | Status: AC
  Administered 2011-01-01: 20 mL/h via INTRAVENOUS

## 2011-01-01 MED ORDER — ZOLEDRONIC ACID 4 MG/100ML IV SOLN
4.0000 mg | Freq: Once | INTRAVENOUS | Status: AC
  Administered 2011-01-01: 4 mg via INTRAVENOUS
  Filled 2011-01-01: qty 100

## 2011-01-01 NOTE — Telephone Encounter (Signed)
No

## 2011-01-01 NOTE — Telephone Encounter (Addendum)
bmet hemolyzed today . Is due for labs 12/26 and zometa 1/9. Do we need to bring him back in to redraw  BMET?

## 2011-01-02 NOTE — Telephone Encounter (Signed)
PEr Dr. Donnald Garre Pt does not need to have bmet redrawn-it hemolyzed yesterday

## 2011-01-04 ENCOUNTER — Emergency Department (HOSPITAL_BASED_OUTPATIENT_CLINIC_OR_DEPARTMENT_OTHER)
Admission: EM | Admit: 2011-01-04 | Discharge: 2011-01-04 | Disposition: A | Discharge status: Home / Self Care | Payer: Medicare Other | Attending: Emergency Medicine | Admitting: Emergency Medicine

## 2011-01-04 ENCOUNTER — Emergency Department (INDEPENDENT_AMBULATORY_CARE_PROVIDER_SITE_OTHER): Payer: Medicare Other

## 2011-01-04 ENCOUNTER — Encounter (HOSPITAL_BASED_OUTPATIENT_CLINIC_OR_DEPARTMENT_OTHER): Payer: Self-pay | Admitting: Emergency Medicine

## 2011-01-04 DIAGNOSIS — E119 Type 2 diabetes mellitus without complications: Secondary | ICD-10-CM | POA: Insufficient documentation

## 2011-01-04 DIAGNOSIS — R059 Cough, unspecified: Secondary | ICD-10-CM

## 2011-01-04 DIAGNOSIS — R05 Cough: Secondary | ICD-10-CM

## 2011-01-04 DIAGNOSIS — C9000 Multiple myeloma not having achieved remission: Secondary | ICD-10-CM | POA: Insufficient documentation

## 2011-01-04 DIAGNOSIS — E7800 Pure hypercholesterolemia, unspecified: Secondary | ICD-10-CM | POA: Insufficient documentation

## 2011-01-04 DIAGNOSIS — R509 Fever, unspecified: Secondary | ICD-10-CM

## 2011-01-04 LAB — BASIC METABOLIC PANEL
CO2: 25 mEq/L (ref 19–32)
Calcium: 8.9 mg/dL (ref 8.4–10.5)
Chloride: 103 mEq/L (ref 96–112)
Creatinine, Ser: 1 mg/dL (ref 0.50–1.35)
Glucose, Bld: 126 mg/dL — ABNORMAL HIGH (ref 70–99)
Sodium: 137 mEq/L (ref 135–145)

## 2011-01-04 LAB — DIFFERENTIAL
Eosinophils Relative: 0 % (ref 0–5)
Lymphocytes Relative: 21 % (ref 12–46)
Lymphs Abs: 1.1 10*3/uL (ref 0.7–4.0)
Monocytes Absolute: 1.2 10*3/uL — ABNORMAL HIGH (ref 0.1–1.0)

## 2011-01-04 LAB — CBC
HCT: 32.5 % — ABNORMAL LOW (ref 39.0–52.0)
MCV: 71.4 fL — ABNORMAL LOW (ref 78.0–100.0)
RBC: 4.55 MIL/uL (ref 4.22–5.81)
WBC: 5.3 10*3/uL (ref 4.0–10.5)

## 2011-01-04 MED ORDER — AZITHROMYCIN 250 MG PO TABS: ORAL_TABLET | ORAL | Status: AC

## 2011-01-04 NOTE — ED Notes (Signed)
Pt c/o fever, chills cough since yesterday; stem cell transplant 9/12; was referred here for "cultures and an XR"; denies pain.

## 2011-01-04 NOTE — ED Provider Notes (Signed)
History     CSN: [id number] Arrival date & time: 01/04/2011 11:26 AM   First MD Initiated Contact with Patient 01/04/11 1236      Chief Complaint  Patient presents with  . Fever    (Consider location/radiation/quality/duration/timing/severity/associated sxs/prior treatment) HPI This 65 year old male has a history of multiple myeloma status post chemotherapy in the past with an autologous stem cell transplant in September of this year who has done quite well now presents with 2 days of cough with fever without nasal congestion or pain. He has no chest pain no body aches no shortness of breath no headache no stiff neck no rash no confusion no abdominal pain no vomiting no diarrhea no difficulty urinating and no other concerns. He was told by his cancer clinic to come to the emergency room for cultures and chest x-ray. Past Medical History  Diagnosis Date  . Multiple myeloma   . Diabetes mellitus   . High cholesterol     Past Surgical History  Procedure Date  . Limbal stem cell transplant     No family history on file.  History  Substance Use Topics  . Smoking status: Never Smoker   . Smokeless tobacco: Not on file  . Alcohol Use: No      Review of Systems  Constitutional: Positive for fever.  Respiratory: Positive for cough.     Allergies  Review of patient's allergies indicates no known allergies.  Home Medications   Current Outpatient Rx  Name Route Sig Dispense Refill  . VALACYCLOVIR HCL 500 MG PO TABS Oral Take 500 mg by mouth daily.      . AZITHROMYCIN 250 MG PO TABS  2 po day one, then 1 daily x 4 days 5 tablet 0  . EZETIMIBE 10 MG PO TABS Oral Take 10 mg by mouth daily.      [name] LISINOPRIL 10 MG PO TABS Oral Take 10 mg by mouth daily.      [name] METFORMIN HCL 500 MG PO TABS Oral Take 500 mg by mouth daily with breakfast.     . SIMVASTATIN 10 MG PO TABS Oral Take 10 mg by mouth at bedtime.        BP 128/70  Pulse 100  Temp(Src) 100 F (37.8 C) (Oral)   Resp 18  Ht 6\' 1"  (1.854 m)  Wt 160 lb (72.576 kg)  BMI 21.11 kg/m2  SpO2 100%  Physical Exam  Nursing note and vitals reviewed. Constitutional:       Awake, alert, nontoxic appearance.  HENT:  Head: Atraumatic.  Mouth/Throat: Oropharynx is clear and moist.  Eyes: Right eye exhibits no discharge. Left eye exhibits no discharge.  Neck: Neck supple.  Cardiovascular: Normal rate and regular rhythm.   No murmur heard. Pulmonary/Chest: Effort normal and breath sounds normal. No respiratory distress. He has no wheezes. He has no rales. He exhibits no tenderness.  Abdominal: Soft. Bowel sounds are normal. There is no tenderness. There is no rebound.  Musculoskeletal: He exhibits no edema and no tenderness.       Baseline ROM, no obvious new focal weakness.  Neurological:       Mental status and motor strength appears baseline for patient and situation.  Skin: No rash noted.  Psychiatric: He has a normal mood and affect.    ED Course  Procedures (including critical care time) Discussed with oncology on call who recommends start the patient on Zithromax and he appears nontoxic in the ED and stable for outpatient followup  with blood cultures pnd.  Patient informed of clinical course, understand medical decision-making process, and agree with plan. Labs Reviewed  CBC - Abnormal; Notable for the following:    Hemoglobin 10.5 (*)    HCT 32.5 (*)    MCV 71.4 (*)    MCH 23.1 (*)    RDW 15.9 (*)    Platelets 148 (*)    All other components within normal limits  DIFFERENTIAL - Abnormal; Notable for the following:    Monocytes Relative 22 (*)    Monocytes Absolute 1.2 (*)    All other components within normal limits  BASIC METABOLIC PANEL - Abnormal; Notable for the following:    Glucose, Bld 126 (*)    GFR calc non Af Amer 77 (*)    GFR calc Af Amer 89 (*)    All other components within normal limits  CULTURE, BLOOD (ROUTINE X 2)  CULTURE, BLOOD (ROUTINE X 2)   Dg Chest 2  View  01/04/2011  *RADIOLOGY REPORT*  Clinical Data: Fever and cough.  CHEST - 2 VIEW  Comparison: PA and lateral chest 08/28/2010.Osseous survey 07/30/2010.  Findings: Dialysis catheter has been removed.  Lungs are clear. Heart size is normal.  No pneumothorax or effusion.  There is partial visualization of fracture fixation of the right humerus. Endosteal scalloping in the left humerus is unchanged. Subacute lower left rib fractures are unchanged.  IMPRESSION: No acute finding.  Original Report Authenticated By: Bernadene Bell. D'ALESSIO, M.D.     1. Fever   2. Cough       MDM          Hurman Horn, MD 01/04/11 2216

## 2011-01-10 LAB — CULTURE, BLOOD (ROUTINE X 2)
Culture: NO GROWTH
Culture: NO GROWTH

## 2011-01-15 ENCOUNTER — Other Ambulatory Visit (HOSPITAL_BASED_OUTPATIENT_CLINIC_OR_DEPARTMENT_OTHER): Payer: Medicare Other | Admitting: Lab

## 2011-01-15 ENCOUNTER — Telehealth: Payer: Self-pay | Admitting: Internal Medicine

## 2011-01-15 ENCOUNTER — Other Ambulatory Visit: Payer: Self-pay | Admitting: Internal Medicine

## 2011-01-15 ENCOUNTER — Ambulatory Visit (HOSPITAL_BASED_OUTPATIENT_CLINIC_OR_DEPARTMENT_OTHER): Payer: Medicare Other | Admitting: Internal Medicine

## 2011-01-15 DIAGNOSIS — C9000 Multiple myeloma not having achieved remission: Secondary | ICD-10-CM

## 2011-01-15 LAB — CBC WITH DIFFERENTIAL/PLATELET
Basophils Absolute: 0 10*3/uL (ref 0.0–0.1)
Eosinophils Absolute: 0 10*3/uL (ref 0.0–0.5)
HGB: 11 g/dL — ABNORMAL LOW (ref 13.0–17.1)
MONO#: 0.5 10*3/uL (ref 0.1–0.9)
NEUT#: 2 10*3/uL (ref 1.5–6.5)
RBC: 4.78 10*6/uL (ref 4.20–5.82)
RDW: 15.3 % — ABNORMAL HIGH (ref 11.0–14.6)
WBC: 3.6 10*3/uL — ABNORMAL LOW (ref 4.0–10.3)
lymph#: 1.1 10*3/uL (ref 0.9–3.3)
nRBC: 0 % (ref 0–0)

## 2011-01-15 LAB — COMPREHENSIVE METABOLIC PANEL
AST: 16 U/L (ref 0–37)
BUN: 11 mg/dL (ref 6–23)
Calcium: 9.2 mg/dL (ref 8.4–10.5)
Chloride: 103 mEq/L (ref 96–112)
Creatinine, Ser: 0.82 mg/dL (ref 0.50–1.35)
Glucose, Bld: 134 mg/dL — ABNORMAL HIGH (ref 70–99)

## 2011-01-15 LAB — LACTATE DEHYDROGENASE: LDH: 153 U/L (ref 94–250)

## 2011-01-15 MED ORDER — LENALIDOMIDE 10 MG PO CAPS: 10.0000 mg | ORAL_CAPSULE | Freq: Every day | ORAL | Status: DC

## 2011-01-15 NOTE — Telephone Encounter (Signed)
gve the pt his jan 2013 appt calendar °

## 2011-01-15 NOTE — Progress Notes (Signed)
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   PRIOR THERAPY: :  1. Status post 6 cycles of systemic chemotherapy with Revlimid and Decadron, last dose was given 07/21/2010 with very good response. 2. 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 under the care of Dr. Lance Bosch. 3.  CURRENT THERAPY: Zometa 4 mg IV every 4 weeks.   INTERVAL HISTORY: Eddie Thomas. 65 y.o. male returns to the clinic today for followup visit. He is feeling fine he denied having any specific complaints. He was seen recently by Dr. Lance Bosch at [facility]. Bone marrow biopsy was performed at that time and according to the patient it showed only 3% plasma cells. The patient denied having any chest pain or shortness of breath, nausea or vomiting, no weight loss or night sweats. He is tolerating his treatment with monthly Zometa fairly well.  MEDICAL HISTORY: Past Medical History  Diagnosis Date  . Multiple myeloma   . Diabetes mellitus   . High cholesterol     ALLERGIES:   has no known allergies.  MEDICATIONS:  Current Outpatient Prescriptions  Medication Sig Dispense Refill  . ezetimibe (ZETIA) 10 MG tablet Take 10 mg by mouth daily.        [name] lisinopril (PRINIVIL,ZESTRIL) 10 MG tablet Take 10 mg by mouth daily.        . metFORMIN (GLUCOPHAGE) 500 MG tablet Take 500 mg by mouth daily with breakfast.       . simvastatin (ZOCOR) 10 MG tablet Take 10 mg by mouth at bedtime.        . valACYclovir (VALTREX) 500 MG tablet Take 500 mg by mouth daily.          SURGICAL HISTORY:  Past Surgical History  Procedure Date  . Limbal stem cell transplant     REVIEW OF SYSTEMS:  A comprehensive review of systems was negative.   PHYSICAL EXAMINATION: General appearance: alert, cooperative and no distress Head: Normocephalic,  without obvious abnormality, atraumatic Neck: no adenopathy Lymph nodes: Cervical, supraclavicular, and axillary nodes normal. Resp: clear to auscultation bilaterally Cardio: regular rate and rhythm, S1, S2 normal, no murmur, click, rub or gallop GI: soft, non-tender; bowel sounds normal; no masses,  no organomegaly Extremities: extremities normal, atraumatic, no cyanosis or edema Neurologic: Alert and oriented X 3, normal strength and tone. Normal symmetric reflexes. Normal coordination and gait  ECOG PERFORMANCE STATUS: 0 - Asymptomatic  Blood pressure 108/64, pulse 56, temperature 97.1 F (36.2 C), temperature source Oral, height 6\' 1"  (1.854 m), weight 157 lb 6.4 oz (71.396 kg).  LABORATORY DATA: Lab Results  Component Value Date   WBC 5.3 01/04/2011   HGB 10.5* 01/04/2011   HCT 32.5* 01/04/2011   MCV 71.4* 01/04/2011   PLT 148* 01/04/2011      Chemistry      Component Value Date/Time   NA 137 01/04/2011 1215   K 4.1 01/04/2011 1215   CL 103 01/04/2011 1215   CO2 25 01/04/2011 1215   BUN 11 01/04/2011 1215   CREATININE 1.00 01/04/2011 1215      Component Value Date/Time   CALCIUM 8.9 01/04/2011 1215   ALKPHOS 51 12/17/2010 0847   AST 14 12/17/2010 0847   ALT 16 12/17/2010 0847   BILITOT 1.0 12/17/2010 0847       RADIOGRAPHIC STUDIES: Dg  Chest 2 View  01/04/2011  *RADIOLOGY REPORT*  Clinical Data: Fever and cough.  CHEST - 2 VIEW  Comparison: PA and lateral chest 08/28/2010.Osseous survey 07/30/2010.  Findings: Dialysis catheter has been removed.  Lungs are clear. Heart size is normal.  No pneumothorax or effusion.  There is partial visualization of fracture fixation of the right humerus. Endosteal scalloping in the left humerus is unchanged. Subacute lower left rib fractures are unchanged.  IMPRESSION: No acute finding.  Original Report Authenticated By: Bernadene Bell. D'ALESSIO, M.D.    ASSESSMENT: This is a very pleasant 65 years old African American male with  multiple myeloma status post treatment with Revlimid followed by peripheral blood autologous stem cell transplant. He is feeling fine and he has no evidence for disease progression based on a recent bone marrow biopsy performed at at [facility].  PLAN: I recommended for the patient to consider maintenance chemotherapy with Revlimid 10 mg by mouth daily. I give him prophylactic dose of Coumadin 2 mg by mouth daily. He would continue his monthly Zometa as scheduled. The patient come back for followup visit in one month's for reevaluation.   All questions were answered. The patient knows to call the clinic with any problems, questions or concerns. We can certainly see the patient much sooner if necessary.  I spent 20 minutes counseling the patient face to face. The total time spent in the appointment was 40 minutes.

## 2011-01-15 NOTE — Telephone Encounter (Signed)
Pt notified that he must take phone survey to activate medication release . RX to Dr. Donnald Garre for signature

## 2011-01-16 ENCOUNTER — Other Ambulatory Visit: Payer: Medicare Other | Admitting: Lab

## 2011-01-16 ENCOUNTER — Telehealth: Payer: Self-pay | Admitting: Internal Medicine

## 2011-01-16 ENCOUNTER — Ambulatory Visit: Payer: Medicare Other | Admitting: Internal Medicine

## 2011-01-16 NOTE — Telephone Encounter (Signed)
Rx for revlimid faxed to [facility] # [id number]

## 2011-01-16 NOTE — Telephone Encounter (Signed)
Returned call  ,but unable to leave message .Prescription faxed earlier

## 2011-01-28 ENCOUNTER — Other Ambulatory Visit: Payer: Self-pay | Admitting: Internal Medicine

## 2011-01-29 ENCOUNTER — Ambulatory Visit (HOSPITAL_BASED_OUTPATIENT_CLINIC_OR_DEPARTMENT_OTHER): Payer: Medicare Other

## 2011-01-29 DIAGNOSIS — C9000 Multiple myeloma not having achieved remission: Secondary | ICD-10-CM

## 2011-01-29 LAB — BASIC METABOLIC PANEL
CO2: 26 mEq/L (ref 19–32)
Glucose, Bld: 175 mg/dL — ABNORMAL HIGH (ref 70–99)
Potassium: 3.9 mEq/L (ref 3.5–5.3)
Sodium: 138 mEq/L (ref 135–145)

## 2011-01-29 MED ORDER — SODIUM CHLORIDE 0.9 % IV SOLN
Freq: Once | INTRAVENOUS | Status: AC
  Administered 2011-01-29: 12:00:00 via INTRAVENOUS

## 2011-01-29 MED ORDER — ZOLEDRONIC ACID 4 MG/100ML IV SOLN
4.0000 mg | Freq: Once | INTRAVENOUS | Status: AC
  Administered 2011-01-29: 4 mg via INTRAVENOUS
  Filled 2011-01-29: qty 100

## 2011-02-11 ENCOUNTER — Other Ambulatory Visit: Payer: Self-pay | Admitting: *Deleted

## 2011-02-11 ENCOUNTER — Other Ambulatory Visit: Payer: Self-pay | Admitting: Internal Medicine

## 2011-02-11 MED ORDER — LENALIDOMIDE 10 MG PO CAPS: 10.0000 mg | ORAL_CAPSULE | Freq: Every day | ORAL | Status: DC

## 2011-02-11 NOTE — Telephone Encounter (Signed)
rx sent to Dr Donnald Garre for signature then i will fax to [facility]

## 2011-02-11 NOTE — Telephone Encounter (Signed)
THIS REQUEST WAS GIVEN TO DR.MOHAMED'S NURSE, DIANE BELL,RN. 

## 2011-02-12 ENCOUNTER — Telehealth: Payer: Self-pay | Admitting: Internal Medicine

## 2011-02-12 ENCOUNTER — Encounter: Payer: Self-pay | Admitting: Internal Medicine

## 2011-02-12 ENCOUNTER — Other Ambulatory Visit: Payer: Self-pay | Admitting: Internal Medicine

## 2011-02-12 ENCOUNTER — Other Ambulatory Visit (HOSPITAL_BASED_OUTPATIENT_CLINIC_OR_DEPARTMENT_OTHER): Payer: Medicare Other | Admitting: Lab

## 2011-02-12 ENCOUNTER — Ambulatory Visit (HOSPITAL_BASED_OUTPATIENT_CLINIC_OR_DEPARTMENT_OTHER): Payer: Medicare Other | Admitting: Physician Assistant

## 2011-02-12 ENCOUNTER — Encounter: Payer: Self-pay | Admitting: Physician Assistant

## 2011-02-12 DIAGNOSIS — C9000 Multiple myeloma not having achieved remission: Secondary | ICD-10-CM

## 2011-02-12 DIAGNOSIS — M79609 Pain in unspecified limb: Secondary | ICD-10-CM

## 2011-02-12 LAB — CBC WITH DIFFERENTIAL/PLATELET
BASO%: 0.4 % (ref 0.0–2.0)
Basophils Absolute: 0 10*3/uL (ref 0.0–0.1)
EOS%: 9.1 % — ABNORMAL HIGH (ref 0.0–7.0)
HCT: 33 % — ABNORMAL LOW (ref 38.4–49.9)
HGB: 10.8 g/dL — ABNORMAL LOW (ref 13.0–17.1)
LYMPH%: 16.6 % (ref 14.0–49.0)
MCH: 24.2 pg — ABNORMAL LOW (ref 27.2–33.4)
MCHC: 32.8 g/dL (ref 32.0–36.0)
MONO#: 0.5 10*3/uL (ref 0.1–0.9)
NEUT%: 60.6 % (ref 39.0–75.0)
Platelets: 218 10*3/uL (ref 140–400)
lymph#: 0.7 10*3/uL — ABNORMAL LOW (ref 0.9–3.3)

## 2011-02-12 LAB — COMPREHENSIVE METABOLIC PANEL
Albumin: 4.4 g/dL (ref 3.5–5.2)
BUN: 7 mg/dL (ref 6–23)
CO2: 27 mEq/L (ref 19–32)
Chloride: 101 mEq/L (ref 96–112)
Creatinine, Ser: 0.83 mg/dL (ref 0.50–1.35)
Total Bilirubin: 1 mg/dL (ref 0.3–1.2)
Total Protein: 6.1 g/dL (ref 6.0–8.3)

## 2011-02-12 NOTE — Progress Notes (Signed)
[facility] OFFICE PROGRESS NOTE  [place], [place], [place] [address] 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   PRIOR THERAPY: :  1. Status post 6 cycles of systemic chemotherapy with Revlimid and Decadron, last dose was given 07/21/2010 with very good response. 2. 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 Lance Bosch     CURRENT THERAPY:  1. Zometa 4 mg IV every 4 weeks. 2. maintenance Revlimid at 10 mg by mouth daily 3. prophylactic dose Coumadin at 2 mg by mouth daily   INTERVAL HISTORY: Kristine Garbe. 66 y.o. male returns to the clinic today for followup visit. He is tolerating his treatment with monthly Zometa fairly well. He is also tolerating the maintenance dose Revlimid and prophylactic dose Coumadin without difficulty. He's had no problems with bleeding or bruising. He started the Revlimid and Coumadin on 01/18/2011. 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 He also notices more discomfort if he bears weight on his arm. He is concerned that he may be developing similar problems as he did in his right arm which required orthopedic surgical intervention. This was performed by Dr. Francena Hanly of [facility] He notes that his fingertips and toes feel cold.  MEDICAL HISTORY: Past Medical History  Diagnosis Date  . Multiple myeloma   . Diabetes mellitus   . High cholesterol     ALLERGIES:   has no known allergies.  MEDICATIONS:  Current Outpatient Prescriptions  Medication Sig Dispense Refill  . ezetimibe (ZETIA) 10 MG tablet Take 10 mg by mouth daily.        [name] lenalidomide (REVLIMID) 10 MG capsule Take 1 capsule (10 mg total) by mouth daily.  28 capsule  0  . lisinopril (PRINIVIL,ZESTRIL) 10 MG tablet Take 10 mg by mouth daily.         . metFORMIN (GLUCOPHAGE) 500 MG tablet Take 500 mg by mouth daily with breakfast.       . simvastatin (ZOCOR) 10 MG tablet Take 10 mg by mouth at bedtime.        . valACYclovir (VALTREX) 500 MG tablet Take 500 mg by mouth daily.        [name] warfarin (COUMADIN) 2 MG tablet Take 2 mg by mouth daily.          SURGICAL HISTORY:  Past Surgical History  Procedure Date  . Limbal stem cell transplant     REVIEW OF SYSTEMS:  A comprehensive review of systems was negative except for: Musculoskeletal: positive for bone pain Neurological: positive for Coolness. to his fingertips and toes   PHYSICAL EXAMINATION: General appearance: alert, cooperative and no distress Head: Normocephalic, without obvious abnormality, atraumatic Neck: no adenopathy Lymph nodes: Cervical, supraclavicular, and axillary nodes normal. Resp: clear to auscultation bilaterally Cardio: regular rate and rhythm, S1, S2 normal, no murmur, click, rub or gallop GI: soft, non-tender; bowel sounds normal; no masses,  no organomegaly Extremities: extremities normal, atraumatic, no cyanosis or edema and No point tenderness in the biceps triceps or to the humerus Neurologic: Alert and oriented X 3, normal strength and tone. Normal symmetric reflexes. Normal coordination and gait  ECOG PERFORMANCE STATUS: 0 - Asymptomatic  Blood pressure 125/67, pulse 64, temperature 97.8 F (36.6 C), temperature source Oral, height 6\' 1"  (1.854 m),  weight 162 lb 11.2 oz (73.8 kg).  LABORATORY DATA: Lab Results  Component Value Date   WBC 4.1 02/12/2011   HGB 10.8* 02/12/2011   HCT 33.0* 02/12/2011   MCV 73.9* 02/12/2011   PLT 218 02/12/2011      Chemistry      Component Value Date/Time   NA 138 01/29/2011 1111   K 3.9 01/29/2011 1111   CL 100 01/29/2011 1111   CO2 26 01/29/2011 1111   BUN 10 01/29/2011 1111   CREATININE 0.83 01/29/2011 1111      Component Value Date/Time   CALCIUM 9.6 01/29/2011 1111   ALKPHOS 48 01/15/2011 1031   AST 16 01/15/2011  1031   ALT 16 01/15/2011 1031   BILITOT 1.2 01/15/2011 1031       RADIOGRAPHIC STUDIES: Dg Chest 2 View  01/04/2011  *RADIOLOGY REPORT*  Clinical Data: Fever and cough.  CHEST - 2 VIEW  Comparison: PA and lateral chest 08/28/2010.Osseous survey 07/30/2010.  Findings: Dialysis catheter has been removed.  Lungs are clear. Heart size is normal.  No pneumothorax or effusion.  There is partial visualization of fracture fixation of the right humerus. Endosteal scalloping in the left humerus is unchanged. Subacute lower left rib fractures are unchanged.  IMPRESSION: No acute finding.  Original Report Authenticated By: Bernadene Bell. D'ALESSIO, M.D.    ASSESSMENT: This is a very pleasant 66 years old African American male with multiple myeloma status post treatment with Revlimid followed by peripheral blood autologous stem cell transplant. He is feeling fine and he has no evidence for disease progression based on a recent bone marrow biopsy performed at at [facility]. The patient was discussed with Dr. Arbutus Ped. He will continue on his maintenance dose Revlimid 10 mg by mouth daily and prophylactic dose Coumadin at 2 mg by mouth daily. He'll continue on his monthly Zometa at 4 mg IV. He'll return in one month with a repeat CBC differential C. met and LDH. Regarding his left arm pain the were for him back to Dr. supple a [facility] orthopedics for further evaluation and management. He may also need evaluation and intervention by radiation oncology.  Conni Slipper, PA-C   All questions were answered. The patient knows to call the clinic with any problems, questions or concerns. We can certainly see the patient much sooner if necessary.  I spent 20 minutes counseling the patient face to face. The total time spent in the appointment was 40 minutes.

## 2011-02-12 NOTE — Telephone Encounter (Signed)
[facility]

## 2011-02-12 NOTE — Telephone Encounter (Signed)
appt made and printed for 2/20 and pt to see [facility] Dr Rennis Chris Pa on 1/29 at 1:15/1:45,aware  aom

## 2011-02-12 NOTE — Telephone Encounter (Signed)
F[facility]ed signed rx [facility]. Pt notified to take survey-he voices understanding

## 2011-02-26 ENCOUNTER — Ambulatory Visit (HOSPITAL_BASED_OUTPATIENT_CLINIC_OR_DEPARTMENT_OTHER): Payer: Medicare Other

## 2011-02-26 DIAGNOSIS — C9000 Multiple myeloma not having achieved remission: Secondary | ICD-10-CM

## 2011-02-26 MED ORDER — SODIUM CHLORIDE 0.9 % IV SOLN
Freq: Once | INTRAVENOUS | Status: AC
  Administered 2011-02-26: 12:00:00 via INTRAVENOUS

## 2011-02-26 MED ORDER — ZOLEDRONIC ACID 4 MG/100ML IV SOLN
4.0000 mg | Freq: Once | INTRAVENOUS | Status: AC
  Administered 2011-02-26: 4 mg via INTRAVENOUS
  Filled 2011-02-26: qty 100

## 2011-03-10 ENCOUNTER — Encounter: Payer: Self-pay | Admitting: *Deleted

## 2011-03-10 ENCOUNTER — Other Ambulatory Visit: Payer: Self-pay | Admitting: *Deleted

## 2011-03-10 NOTE — Progress Notes (Signed)
Received call from [employer] stating that pt has answered "No" in the pt survey when asked if he is aware that drug can cause birth defects and now his account has been restricted.  Called pt and he stated he was aware of defects but he just accidentally answered it incorrectly on the phone survey.  Called [employer] to clairfy that pt is aware and they lifted the restriction on the rx.  SLJ

## 2011-03-10 NOTE — Telephone Encounter (Signed)
THIS REQUEST WAS GIVEN TO DR.MOHAMED'S NURSE, STEPHANIE JOHNSON,RN. 

## 2011-03-11 MED ORDER — LENALIDOMIDE 10 MG PO CAPS: 10.0000 mg | ORAL_CAPSULE | Freq: Every day | ORAL | Status: DC

## 2011-03-11 NOTE — Telephone Encounter (Signed)
Addended by: Arvilla Meres on: 03/11/2011 12:53 PM   Modules accepted: Orders

## 2011-03-12 ENCOUNTER — Encounter: Payer: Self-pay | Admitting: Physician Assistant

## 2011-03-12 ENCOUNTER — Other Ambulatory Visit: Payer: Self-pay | Admitting: *Deleted

## 2011-03-12 ENCOUNTER — Telehealth: Payer: Self-pay | Admitting: Internal Medicine

## 2011-03-12 ENCOUNTER — Ambulatory Visit (HOSPITAL_BASED_OUTPATIENT_CLINIC_OR_DEPARTMENT_OTHER): Payer: Medicare Other | Admitting: Physician Assistant

## 2011-03-12 ENCOUNTER — Other Ambulatory Visit: Payer: Self-pay | Admitting: Physician Assistant

## 2011-03-12 ENCOUNTER — Other Ambulatory Visit: Payer: Medicare Other | Admitting: Lab

## 2011-03-12 DIAGNOSIS — C9000 Multiple myeloma not having achieved remission: Secondary | ICD-10-CM

## 2011-03-12 LAB — CBC WITH DIFFERENTIAL/PLATELET
BASO%: 0.6 % (ref 0.0–2.0)
EOS%: 12 % — ABNORMAL HIGH (ref 0.0–7.0)
LYMPH%: 27 % (ref 14.0–49.0)
MCH: 23.9 pg — ABNORMAL LOW (ref 27.2–33.4)
MCHC: 32.4 g/dL (ref 32.0–36.0)
MCV: 73.7 fL — ABNORMAL LOW (ref 79.3–98.0)
MONO%: 12.9 % (ref 0.0–14.0)
Platelets: 191 10*3/uL (ref 140–400)
RBC: 4.64 10*6/uL (ref 4.20–5.82)
WBC: 4.2 10*3/uL (ref 4.0–10.3)

## 2011-03-12 LAB — COMPREHENSIVE METABOLIC PANEL
ALT: 21 U/L (ref 0–53)
Alkaline Phosphatase: 46 U/L (ref 39–117)
Sodium: 139 mEq/L (ref 135–145)
Total Bilirubin: 1.7 mg/dL — ABNORMAL HIGH (ref 0.3–1.2)
Total Protein: 6 g/dL (ref 6.0–8.3)

## 2011-03-12 MED ORDER — LENALIDOMIDE 15 MG PO CAPS: 15.0000 mg | ORAL_CAPSULE | Freq: Every day | ORAL | Status: DC

## 2011-03-12 NOTE — Progress Notes (Signed)
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   PRIOR THERAPY: :  1. Status post 6 cycles of systemic chemotherapy with Revlimid and Decadron, last dose was given 07/21/2010 with very good response. 2. 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 Lance Bosch     CURRENT THERAPY:  1. Zometa 4 mg IV every 4 weeks. 2. maintenance Revlimid at 10 mg by mouth daily 3. prophylactic dose Coumadin at 2 mg by mouth daily   INTERVAL HISTORY: Eddie Thomas. 66 y.o. male returns to the clinic today for followup visit. He is tolerating his treatment with monthly Zometa fairly well. He is also tolerating the maintenance dose Revlimid and prophylactic dose Coumadin without difficulty. He's had no problems with bleeding or bruising. He started the Revlimid and Coumadin on 01/18/2011. He continues to complain of left arm discomfort, more of a soreness in the shoulder, bicep/tricep area. It has improved over the past several days with Tylenol. He was recently evaluated by Dr. Francena Hanly of [facility] for this problem. He had several X-rays done with no evidence for fracture. He is to follow up in 6 months.  MEDICAL HISTORY: Past Medical History  Diagnosis Date  . Multiple myeloma   . Diabetes mellitus   . High cholesterol     ALLERGIES:   has no known allergies.  MEDICATIONS:  Current Outpatient Prescriptions  Medication Sig Dispense Refill  . ezetimibe (ZETIA) 10 MG tablet Take 10 mg by mouth daily.        [name] lenalidomide (REVLIMID) 10 MG capsule Take 1 capsule (10 mg total) by mouth daily. ADULT MALE/PT.'S SURVEY COMPLETED 03/10/11/AUTHORIZATION #[id number]  28 capsule  0  . lisinopril (PRINIVIL,ZESTRIL) 10 MG tablet Take 10 mg by mouth daily.        . metFORMIN (GLUCOPHAGE) 500 MG  tablet Take 500 mg by mouth daily with breakfast.       . simvastatin (ZOCOR) 10 MG tablet Take 10 mg by mouth at bedtime.        . valACYclovir (VALTREX) 500 MG tablet Take 500 mg by mouth daily.        [name] warfarin (COUMADIN) 2 MG tablet Take 2 mg by mouth daily.          SURGICAL HISTORY:  Past Surgical History  Procedure Date  . Limbal stem cell transplant     REVIEW OF SYSTEMS:  A comprehensive review of systems was negative except for: Musculoskeletal: positive for arthralgias and bone pain   PHYSICAL EXAMINATION: General appearance: alert, cooperative and no distress Head: Normocephalic, without obvious abnormality, atraumatic Neck: no adenopathy Lymph nodes: Cervical, supraclavicular, and axillary nodes normal. Resp: clear to auscultation bilaterally Cardio: regular rate and rhythm, S1, S2 normal, no murmur, click, rub or gallop GI: soft, non-tender; bowel sounds normal; no masses,  no organomegaly Extremities: extremities normal, atraumatic, no cyanosis or edema and No point tenderness in the biceps triceps or to the humerus Neurologic: Alert and oriented X 3, normal strength and tone. Normal symmetric reflexes. Normal coordination and gait  ECOG PERFORMANCE STATUS: 0 - Asymptomatic  Blood pressure 105/61, pulse 53, temperature 97.3 F (36.3 C), temperature source Oral, height 6\' 1"  (1.854 m), weight 164 lb 11.2 oz (74.707 kg).  LABORATORY DATA: Lab Results  Component Value Date   WBC  4.2 03/12/2011   HGB 11.1* 03/12/2011   HCT 34.2* 03/12/2011   MCV 73.7* 03/12/2011   PLT 191 03/12/2011      Chemistry      Component Value Date/Time   NA 137 02/12/2011 1203   K 4.3 02/12/2011 1203   CL 101 02/12/2011 1203   CO2 27 02/12/2011 1203   BUN 7 02/12/2011 1203   CREATININE 0.83 02/12/2011 1203      Component Value Date/Time   CALCIUM 9.4 02/12/2011 1203   ALKPHOS 60 02/12/2011 1203   AST 14 02/12/2011 1203   ALT 12 02/12/2011 1203   BILITOT 1.0 02/12/2011 1203        RADIOGRAPHIC STUDIES: Dg Chest 2 View  01/04/2011  *RADIOLOGY REPORT*  Clinical Data: Fever and cough.  CHEST - 2 VIEW  Comparison: PA and lateral chest 08/28/2010.Osseous survey 07/30/2010.  Findings: Dialysis catheter has been removed.  Lungs are clear. Heart size is normal.  No pneumothorax or effusion.  There is partial visualization of fracture fixation of the right humerus. Endosteal scalloping in the left humerus is unchanged. Subacute lower left rib fractures are unchanged.  IMPRESSION: No acute finding.  Original Report Authenticated By: Bernadene Bell. D'ALESSIO, M.D.    ASSESSMENT: This is a very pleasant 66 years old African American male with multiple myeloma status post treatment with Revlimid followed by peripheral blood autologous stem cell transplant. He is feeling fine and he has no evidence for disease progression based on a recent bone marrow biopsy performed at at [facility]. The patient was discussed with Dr. Arbutus Ped. We will increase his maintenance dose Revlimid to 15 mg by mouth daily and continue prophylactic dose Coumadin at 2 mg by mouth daily. He'll continue on his monthly Zometa at 4 mg IV. He'll return in one month with a repeat CBC differential C. met and LDH. He may also need evaluation and intervention by radiation oncology if his left arm pain persists.  Laural Benes, Rockford Leinen E, PA-C   All questions were answered. The patient knows to call the clinic with any problems, questions or concerns. We can certainly see the patient much sooner if necessary.

## 2011-03-12 NOTE — Telephone Encounter (Signed)
gv pt appt schedule for march/april. Per AJ added zometa for 3/6 and 4/3.

## 2011-03-12 NOTE — Telephone Encounter (Signed)
[employer] notified of dose change, updated records and the authorization number remains the same for this refill.  [facility] notified and awaiting new prescription to be faxed to [phone].

## 2011-03-26 ENCOUNTER — Ambulatory Visit (HOSPITAL_BASED_OUTPATIENT_CLINIC_OR_DEPARTMENT_OTHER): Payer: Medicare Other

## 2011-03-26 DIAGNOSIS — C9000 Multiple myeloma not having achieved remission: Secondary | ICD-10-CM

## 2011-03-26 MED ORDER — ZOLEDRONIC ACID 4 MG/100ML IV SOLN
4.0000 mg | Freq: Once | INTRAVENOUS | Status: AC
  Administered 2011-03-26: 4 mg via INTRAVENOUS
  Filled 2011-03-26: qty 100

## 2011-04-07 ENCOUNTER — Other Ambulatory Visit: Payer: Self-pay | Admitting: *Deleted

## 2011-04-07 DIAGNOSIS — C9000 Multiple myeloma not having achieved remission: Secondary | ICD-10-CM

## 2011-04-07 NOTE — Telephone Encounter (Signed)
THIS REQUEST FOR REVLIMID WAS GIVEN TO DR.MOHAMED'S NURSE, STEPHANIE JOHNSON,RN.

## 2011-04-08 MED ORDER — LENALIDOMIDE 15 MG PO CAPS: 15.0000 mg | ORAL_CAPSULE | Freq: Every day | ORAL | Status: DC

## 2011-04-08 NOTE — Telephone Encounter (Signed)
PRESCRIPTION REFILL REQUEST COMPLETED

## 2011-04-08 NOTE — Telephone Encounter (Signed)
Addended by: Arvilla Meres on: 04/08/2011 11:05 AM   Modules accepted: Orders

## 2011-04-09 ENCOUNTER — Telehealth: Payer: Self-pay | Admitting: Internal Medicine

## 2011-04-09 ENCOUNTER — Encounter: Payer: Self-pay | Admitting: Physician Assistant

## 2011-04-09 ENCOUNTER — Other Ambulatory Visit (HOSPITAL_BASED_OUTPATIENT_CLINIC_OR_DEPARTMENT_OTHER): Payer: Medicare Other | Admitting: Lab

## 2011-04-09 ENCOUNTER — Ambulatory Visit (HOSPITAL_BASED_OUTPATIENT_CLINIC_OR_DEPARTMENT_OTHER): Payer: Medicare Other | Admitting: Physician Assistant

## 2011-04-09 DIAGNOSIS — C9000 Multiple myeloma not having achieved remission: Secondary | ICD-10-CM

## 2011-04-09 LAB — CBC WITH DIFFERENTIAL/PLATELET
BASO%: 0.6 % (ref 0.0–2.0)
Basophils Absolute: 0 10*3/uL (ref 0.0–0.1)
HCT: 32.8 % — ABNORMAL LOW (ref 38.4–49.9)
HGB: 10.4 g/dL — ABNORMAL LOW (ref 13.0–17.1)
LYMPH%: 25.8 % (ref 14.0–49.0)
MCH: 23.6 pg — ABNORMAL LOW (ref 27.2–33.4)
MCHC: 31.7 g/dL — ABNORMAL LOW (ref 32.0–36.0)
MONO#: 0.4 10*3/uL (ref 0.1–0.9)
NEUT%: 56.7 % (ref 39.0–75.0)
Platelets: 189 10*3/uL (ref 140–400)
WBC: 4.1 10*3/uL (ref 4.0–10.3)

## 2011-04-09 LAB — COMPREHENSIVE METABOLIC PANEL
ALT: 19 U/L (ref 0–53)
BUN: 8 mg/dL (ref 6–23)
CO2: 22 mEq/L (ref 19–32)
Calcium: 9.3 mg/dL (ref 8.4–10.5)
Creatinine, Ser: 0.82 mg/dL (ref 0.50–1.35)
Total Bilirubin: 1.9 mg/dL — ABNORMAL HIGH (ref 0.3–1.2)

## 2011-04-09 LAB — LACTATE DEHYDROGENASE: LDH: 110 U/L (ref 94–250)

## 2011-04-09 NOTE — Telephone Encounter (Signed)
gv pt appts for april2013

## 2011-04-11 NOTE — Progress Notes (Signed)
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   PRIOR THERAPY: :  1. Status post 6 cycles of systemic chemotherapy with Revlimid and Decadron, last dose was given 07/21/2010 with very good response. 2. 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 Lance Bosch.  3. maintenance Revlimid at 10 mg by mouth daily status post 2 months. Therapy began 01/18/2011.  CURRENT THERAPY:  1. Zometa 4 mg IV every 4 weeks. 2. maintenance Revlimid at 15 mg by mouth daily  3. prophylactic dose Coumadin at 2 mg by mouth daily   INTERVAL HISTORY: Eddie Thomas. 66 y.o. male returns to the clinic today for followup visit. He is tolerating his treatment with monthly Zometa fairly well. His maintenance dose of Revlimid was increased from 10 mg daily to 15 mg daily last month He is  tolerating the increase in the maintenance dose Revlimid and prophylactic dose Coumadin without difficulty. He's had no problems with bleeding or bruising. He has noted some dry skin particularly on his chest since beginning the increased dose of Revlimid. He also reports that his bowels are looser but he is not having any frank diarrhea. He occasionally notices some bright red blood that he has a history of hemorrhoids. His primary care physician would like you to have a barium enema to evaluate for polyps since he is not come off the Coumadin in preparation for colonoscopy.  MEDICAL HISTORY: Past Medical History  Diagnosis Date  . Multiple myeloma   . Diabetes mellitus   . High cholesterol     ALLERGIES:   has no known allergies.  MEDICATIONS:  Current Outpatient Prescriptions  Medication Sig Dispense Refill  . ezetimibe (ZETIA) 10 MG tablet Take 10 mg by mouth daily.        [name] lenalidomide (REVLIMID) 15 MG capsule Take  1 capsule (15 mg total) by mouth daily. Do not break, chew or open capsules.  May cause dizziness.  Auth # [id number]  28 capsule  0  . lisinopril (PRINIVIL,ZESTRIL) 10 MG tablet Take 10 mg by mouth daily.        . metFORMIN (GLUCOPHAGE) 500 MG tablet Take 500 mg by mouth daily with breakfast.       . simvastatin (ZOCOR) 10 MG tablet Take 10 mg by mouth at bedtime.        . valACYclovir (VALTREX) 500 MG tablet Take 500 mg by mouth daily.        [name] warfarin (COUMADIN) 2 MG tablet Take 2 mg by mouth daily.          SURGICAL HISTORY:  Past Surgical History  Procedure Date  . Limbal stem cell transplant     REVIEW OF SYSTEMS:  Pertinent items are noted in HPI.   PHYSICAL EXAMINATION: General appearance: alert, cooperative and no distress Head: Normocephalic, without obvious abnormality, atraumatic Neck: no adenopathy Lymph nodes: Cervical, supraclavicular, and axillary nodes normal. Resp: clear to auscultation bilaterally Cardio: regular rate and rhythm, S1, S2 normal, no murmur, click, rub or gallop GI: soft, non-tender; bowel sounds normal; no masses,  no organomegaly Extremities: extremities normal, atraumatic, no cyanosis or edema and No point tenderness in the biceps triceps or to the humerus Neurologic: Alert and oriented X 3, normal strength and tone. Normal symmetric reflexes. Normal coordination and gait  ECOG PERFORMANCE  STATUS: 0 - Asymptomatic  Blood pressure 110/63, pulse 49, temperature 98.5 F (36.9 C), temperature source Oral, height 6\' 1"  (1.854 m), weight 167 lb 6.4 oz (75.932 kg).  LABORATORY DATA: Lab Results  Component Value Date   WBC 4.1 04/09/2011   HGB 10.4* 04/09/2011   HCT 32.8* 04/09/2011   MCV 74.3* 04/09/2011   PLT 189 04/09/2011      Chemistry      Component Value Date/Time   NA 138 04/09/2011 1114   K 3.7 04/09/2011 1114   CL 104 04/09/2011 1114   CO2 22 04/09/2011 1114   BUN 8 04/09/2011 1114   CREATININE 0.82 04/09/2011 1114      Component Value  Date/Time   CALCIUM 9.3 04/09/2011 1114   ALKPHOS 45 04/09/2011 1114   AST 15 04/09/2011 1114   ALT 19 04/09/2011 1114   BILITOT 1.9* 04/09/2011 1114       RADIOGRAPHIC STUDIES: Dg Chest 2 View  01/04/2011  *RADIOLOGY REPORT*  Clinical Data: Fever and cough.  CHEST - 2 VIEW  Comparison: PA and lateral chest 08/28/2010.Osseous survey 07/30/2010.  Findings: Dialysis catheter has been removed.  Lungs are clear. Heart size is normal.  No pneumothorax or effusion.  There is partial visualization of fracture fixation of the right humerus. Endosteal scalloping in the left humerus is unchanged. Subacute lower left rib fractures are unchanged.  IMPRESSION: No acute finding.  Original Report Authenticated By: Bernadene Bell. D'ALESSIO, M.D.    ASSESSMENT: This is a very pleasant 66 years old African American male with multiple myeloma status post treatment with Revlimid followed by peripheral blood autologous stem cell transplant. He is feeling fine and he has no evidence for disease progression based on a recent bone marrow biopsy performed at at [facility]. The patient was discussed with Dr. Arbutus Ped. He will continue his maintenance Revlimid at 15 mg by mouth daily and continue prophylactic dose Coumadin at 2 mg by mouth daily. He'll continue on his monthly Zometa at 4 mg IV. He'll return in one month with a repeat CBC differential C. met and LDH as well as a quantitative immunoglobin beta 2 microglobulin and serum light chains to reevaluate his disease.[name]  Laural Benes, Angelino Rumery E, PA-C   All questions were answered. The patient knows to call the clinic with any problems, questions or concerns. We can certainly see the patient much sooner if necessary.

## 2011-04-23 ENCOUNTER — Ambulatory Visit (HOSPITAL_BASED_OUTPATIENT_CLINIC_OR_DEPARTMENT_OTHER): Payer: Medicare Other

## 2011-04-23 DIAGNOSIS — C9000 Multiple myeloma not having achieved remission: Secondary | ICD-10-CM

## 2011-04-23 MED ORDER — ZOLEDRONIC ACID 4 MG/100ML IV SOLN
4.0000 mg | Freq: Once | INTRAVENOUS | Status: AC
  Administered 2011-04-23: 4 mg via INTRAVENOUS
  Filled 2011-04-23: qty 100

## 2011-04-29 ENCOUNTER — Other Ambulatory Visit (HOSPITAL_BASED_OUTPATIENT_CLINIC_OR_DEPARTMENT_OTHER): Payer: Medicare Other | Admitting: Lab

## 2011-04-29 DIAGNOSIS — C9000 Multiple myeloma not having achieved remission: Secondary | ICD-10-CM

## 2011-04-29 LAB — CBC WITH DIFFERENTIAL/PLATELET
Basophils Absolute: 0 10*3/uL (ref 0.0–0.1)
EOS%: 8 % — ABNORMAL HIGH (ref 0.0–7.0)
HCT: 33.8 % — ABNORMAL LOW (ref 38.4–49.9)
HGB: 10.7 g/dL — ABNORMAL LOW (ref 13.0–17.1)
MCH: 23.5 pg — ABNORMAL LOW (ref 27.2–33.4)
MCV: 74 fL — ABNORMAL LOW (ref 79.3–98.0)
MONO%: 16.6 % — ABNORMAL HIGH (ref 0.0–14.0)
NEUT%: 40.9 % (ref 39.0–75.0)

## 2011-05-02 LAB — SPEP & IFE WITH QIG
Albumin ELP: 63.2 % (ref 55.8–66.1)
Alpha-1-Globulin: 5.3 % — ABNORMAL HIGH (ref 2.9–4.9)
Alpha-2-Globulin: 10.6 % (ref 7.1–11.8)
Beta Globulin: 5.2 % (ref 4.7–7.2)
IgG (Immunoglobin G), Serum: 855 mg/dL (ref 650–1600)
M-Spike, %: 0.25 g/dL
Total Protein, Serum Electrophoresis: 6 g/dL (ref 6.0–8.3)

## 2011-05-02 LAB — COMPREHENSIVE METABOLIC PANEL
Albumin: 4.4 g/dL (ref 3.5–5.2)
Alkaline Phosphatase: 47 U/L (ref 39–117)
BUN: 8 mg/dL (ref 6–23)
Glucose, Bld: 126 mg/dL — ABNORMAL HIGH (ref 70–99)
Potassium: 3.7 mEq/L (ref 3.5–5.3)
Total Bilirubin: 2.1 mg/dL — ABNORMAL HIGH (ref 0.3–1.2)

## 2011-05-02 LAB — BETA 2 MICROGLOBULIN, SERUM: Beta-2 Microglobulin: 1.69 mg/L (ref 1.01–1.73)

## 2011-05-02 LAB — LACTATE DEHYDROGENASE: LDH: 115 U/L (ref 94–250)

## 2011-05-02 LAB — KAPPA/LAMBDA LIGHT CHAINS: Kappa free light chain: 1.13 mg/dL (ref 0.33–1.94)

## 2011-05-05 ENCOUNTER — Other Ambulatory Visit: Payer: Self-pay | Admitting: *Deleted

## 2011-05-05 NOTE — Telephone Encounter (Signed)
THIS REQUEST FOR REVLIMID WAS GIVEN TO DR.MOHAMED'S NURSE, DIANE BELL,RN.

## 2011-05-06 ENCOUNTER — Ambulatory Visit (HOSPITAL_BASED_OUTPATIENT_CLINIC_OR_DEPARTMENT_OTHER): Payer: Medicare Other | Admitting: Internal Medicine

## 2011-05-06 ENCOUNTER — Telehealth: Payer: Self-pay | Admitting: Medical Oncology

## 2011-05-06 ENCOUNTER — Telehealth: Payer: Self-pay | Admitting: Internal Medicine

## 2011-05-06 DIAGNOSIS — C9000 Multiple myeloma not having achieved remission: Secondary | ICD-10-CM

## 2011-05-06 NOTE — Progress Notes (Signed)
[facility] Telephone:([phone]   [phone]  OFFICE PROGRESS NOTE  [place], [place], [place] [address] 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   PRIOR THERAPY: :  1. Status post 6 cycles of systemic chemotherapy with Revlimid and Decadron, last dose was given 07/21/2010 with very good response. 2. 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 Lance Bosch.  3. maintenance Revlimid at 10 mg by mouth daily status post 2 months. Therapy began 01/18/2011.  CURRENT THERAPY:  1. Zometa 4 mg IV every 4 weeks.  2. maintenance Revlimid at 15 mg by mouth daily  3. prophylactic dose Coumadin at 2 mg by mouth daily   INTERVAL HISTORY: Eddie Thomas. 66 y.o. male returns to the clinic today for routine followup visit. The patient is tolerating his treatment with Revlimid fairly well. He denied having any significant weight loss or night sweats. No peripheral neuropathy. He has no nausea or vomiting, no constipation. He has no chest pain or shortness of breath. The patient has repeat CBC, ondansetron Ativan and, LDH and myeloma panel performed recently and he is here today for evaluation and discussion of his lab results.  MEDICAL HISTORY: Past Medical History  Diagnosis Date  . Multiple myeloma   . Diabetes mellitus   . High cholesterol     ALLERGIES:   has no known allergies.  MEDICATIONS:  Current Outpatient Prescriptions  Medication Sig Dispense Refill  . ezetimibe (ZETIA) 10 MG tablet Take 10 mg by mouth daily.        [name] lenalidomide (REVLIMID) 15 MG capsule Take 1 capsule (15 mg total) by mouth daily. Do not break, chew or open capsules.  May cause dizziness.  Auth # [id number]  28 capsule  0  . lisinopril (PRINIVIL,ZESTRIL) 10 MG tablet Take 10 mg by mouth daily.        . metFORMIN (GLUCOPHAGE) 500 MG tablet Take 500 mg by  mouth daily with breakfast.       . simvastatin (ZOCOR) 10 MG tablet Take 10 mg by mouth at bedtime.        . valACYclovir (VALTREX) 500 MG tablet Take 500 mg by mouth daily.        [name] warfarin (COUMADIN) 2 MG tablet Take 2 mg by mouth daily.          SURGICAL HISTORY:  Past Surgical History  Procedure Date  . Limbal stem cell transplant     REVIEW OF SYSTEMS:  A comprehensive review of systems was negative.   PHYSICAL EXAMINATION: General appearance: alert, cooperative and no distress Neck: no adenopathy Lymph nodes: Cervical, supraclavicular, and axillary nodes normal. Resp: clear to auscultation bilaterally Cardio: regular rate and rhythm, S1, S2 normal, no murmur, click, rub or gallop GI: soft, non-tender; bowel sounds normal; no masses,  no organomegaly Extremities: extremities normal, atraumatic, no cyanosis or edema Neurologic: Alert and oriented X 3, normal strength and tone. Normal symmetric reflexes. Normal coordination and gait  ECOG PERFORMANCE STATUS: 0 - Asymptomatic  Blood pressure 117/63, pulse 48, temperature 97.7 F (36.5 C), temperature source Oral, height 6\' 1"  (1.854 m), weight 166 lb 3.2 oz (75.388 kg).  LABORATORY DATA: Lab Results  Component Value Date   WBC 2.9* 04/29/2011   HGB 10.7* 04/29/2011   HCT 33.8* 04/29/2011   MCV 74.0* 04/29/2011   PLT 171 04/29/2011  Chemistry      Component Value Date/Time   NA 138 04/29/2011 1058   K 3.7 04/29/2011 1058   CL 104 04/29/2011 1058   CO2 26 04/29/2011 1058   BUN 8 04/29/2011 1058   CREATININE 0.77 04/29/2011 1058      Component Value Date/Time   CALCIUM 8.6 04/29/2011 1058   ALKPHOS 47 04/29/2011 1058   AST 15 04/29/2011 1058   ALT 18 04/29/2011 1058   BILITOT 2.1* 04/29/2011 1058       Lab STUDIES: Beta-2 microglobulin 1.69, Free kappa light chain 1.13, free lambda light chain 1.72 with a kappa/lambda ratio of 0.66   ASSESSMENT: [name] with multiple myeloma  currently on maintenance chemotherapy with Revlimid at 15 mg by mouth daily. The patient is tolerating his treatment fairly well with no significant complaints except for mild . He has no evidence for disease progression on the recent blood work.   PLAN: I discussed the lab result with the patient today. I recommended for him continuous treatment with maintenance Revlimid. I would check a CBC in 2 weeks to monitor his neutropenia. The patient would come back for followup visit in one month for reevaluation with repeat blood work. He was advised to call me immediately if he has any concerning symptoms in the interval.  All questions were answered. The patient knows to call the clinic with any problems, questions or concerns. We can certainly see the patient much sooner if necessary.  I spent 20 minutes counseling the patient face to face. The total time spent in the appointment was 30 minutes.

## 2011-05-06 NOTE — Telephone Encounter (Signed)
Gave pt calendar until August 2013 for lab,md and Zometa, lab on 5/18 will be used for 06/18/11 Zometa per Diane Dr. Arbutus Ped nurse.

## 2011-05-06 NOTE — Telephone Encounter (Signed)
Faxed revlimid refill to [facility]

## 2011-05-20 ENCOUNTER — Other Ambulatory Visit (HOSPITAL_BASED_OUTPATIENT_CLINIC_OR_DEPARTMENT_OTHER): Payer: Medicare Other | Admitting: Lab

## 2011-05-20 DIAGNOSIS — C9000 Multiple myeloma not having achieved remission: Secondary | ICD-10-CM

## 2011-05-20 LAB — BASIC METABOLIC PANEL
BUN: 10 mg/dL (ref 6–23)
Calcium: 9.3 mg/dL (ref 8.4–10.5)
Creatinine, Ser: 0.86 mg/dL (ref 0.50–1.35)
Glucose, Bld: 205 mg/dL — ABNORMAL HIGH (ref 70–99)
Potassium: 3.8 mEq/L (ref 3.5–5.3)

## 2011-05-20 LAB — CBC WITH DIFFERENTIAL/PLATELET
BASO%: 1.1 % (ref 0.0–2.0)
Basophils Absolute: 0 10*3/uL (ref 0.0–0.1)
EOS%: 5.4 % (ref 0.0–7.0)
HCT: 35.7 % — ABNORMAL LOW (ref 38.4–49.9)
HGB: 11.3 g/dL — ABNORMAL LOW (ref 13.0–17.1)
LYMPH%: 31.6 % (ref 14.0–49.0)
MCH: 23.5 pg — ABNORMAL LOW (ref 27.2–33.4)
MCHC: 31.6 g/dL — ABNORMAL LOW (ref 32.0–36.0)
MCV: 74.5 fL — ABNORMAL LOW (ref 79.3–98.0)
MONO%: 13.6 % (ref 0.0–14.0)
NEUT%: 48.3 % (ref 39.0–75.0)
Platelets: 141 10*3/uL (ref 140–400)

## 2011-05-21 ENCOUNTER — Ambulatory Visit (HOSPITAL_BASED_OUTPATIENT_CLINIC_OR_DEPARTMENT_OTHER): Payer: Medicare Other

## 2011-05-21 DIAGNOSIS — C9000 Multiple myeloma not having achieved remission: Secondary | ICD-10-CM

## 2011-05-21 MED ORDER — ZOLEDRONIC ACID 4 MG/5ML IV CONC
4.0000 mg | Freq: Once | INTRAVENOUS | Status: AC
  Administered 2011-05-21: 4 mg via INTRAVENOUS
  Filled 2011-05-21: qty 5

## 2011-05-21 MED ORDER — SODIUM CHLORIDE 0.9 % IV SOLN
INTRAVENOUS | Status: DC
  Administered 2011-05-21: 14:00:00 via INTRAVENOUS

## 2011-05-21 NOTE — Patient Instructions (Signed)
[facility] Discharge Instructions for Patients Receiving Zometa  Today you received the following chemotherapy agents Zometa  To help prevent nausea and vomiting after your treatment, we encourage you to take your nausea medication Dr. Arbutus Ped.  If you develop nausea and vomiting that is not controlled by your nausea medication, call the clinic. If it is after clinic hours your family physician or the after hours number for the clinic or go to the Emergency Department.   BELOW ARE SYMPTOMS THAT SHOULD BE REPORTED IMMEDIATELY:  *FEVER GREATER THAN 100.5 F  *CHILLS WITH OR WITHOUT FEVER  NAUSEA AND VOMITING THAT IS NOT CONTROLLED WITH YOUR NAUSEA MEDICATION  *UNUSUAL SHORTNESS OF BREATH  *UNUSUAL BRUISING OR BLEEDING  TENDERNESS IN MOUTH AND THROAT WITH OR WITHOUT PRESENCE OF ULCERS  *URINARY PROBLEMS  *BOWEL PROBLEMS  UNUSUAL RASH Items with * indicate a potential emergency and should be followed up as soon as possible.   Feel free to call the clinic you have any questions or concerns. The clinic phone number is [phone].   I have been informed and understand all the instructions given to me. I know to contact the clinic, my physician, or go to the Emergency Department if any problems should occur. I do not have any questions at this time, but understand that I may call the clinic during office hours   should I have any questions or need assistance in obtaining follow up care.    __________________________________________  _____________  __________ Signature of Patient or Authorized Representative            Date                   Time    __________________________________________ Nurse's Signature

## 2011-05-27 ENCOUNTER — Other Ambulatory Visit: Payer: Self-pay | Admitting: *Deleted

## 2011-05-27 ENCOUNTER — Telehealth: Payer: Self-pay | Admitting: Medical Oncology

## 2011-05-27 NOTE — Telephone Encounter (Signed)
THIS REQUEST WAS GIVEN TO DR.MOHAMED'S NURSE, DIANE BELL,RN. 

## 2011-05-27 NOTE — Telephone Encounter (Signed)
received  refill for revlimid. Too early to get authorization number l . Given to Dr Donnald Garre for signature.

## 2011-05-29 ENCOUNTER — Other Ambulatory Visit: Payer: Self-pay | Admitting: Medical Oncology

## 2011-05-29 DIAGNOSIS — C9000 Multiple myeloma not having achieved remission: Secondary | ICD-10-CM

## 2011-05-29 MED ORDER — LENALIDOMIDE 15 MG PO CAPS: 15.0000 mg | ORAL_CAPSULE | Freq: Every day | ORAL | Status: DC

## 2011-05-29 NOTE — Telephone Encounter (Signed)
Called pt to take survey for revlimid -he voices understanding

## 2011-05-29 NOTE — Telephone Encounter (Signed)
Faxed revlimid refill to pharmacy

## 2011-06-05 ENCOUNTER — Telehealth: Payer: Self-pay | Admitting: *Deleted

## 2011-06-05 ENCOUNTER — Ambulatory Visit (HOSPITAL_BASED_OUTPATIENT_CLINIC_OR_DEPARTMENT_OTHER): Payer: Medicare Other | Admitting: Physician Assistant

## 2011-06-05 ENCOUNTER — Other Ambulatory Visit (HOSPITAL_BASED_OUTPATIENT_CLINIC_OR_DEPARTMENT_OTHER): Payer: Medicare Other | Admitting: Lab

## 2011-06-05 DIAGNOSIS — C9000 Multiple myeloma not having achieved remission: Secondary | ICD-10-CM

## 2011-06-05 LAB — CBC WITH DIFFERENTIAL/PLATELET
BASO%: 1 % (ref 0.0–2.0)
Basophils Absolute: 0 10*3/uL (ref 0.0–0.1)
EOS%: 4.2 % (ref 0.0–7.0)
HGB: 11 g/dL — ABNORMAL LOW (ref 13.0–17.1)
MCH: 24 pg — ABNORMAL LOW (ref 27.2–33.4)
MCHC: 31.7 g/dL — ABNORMAL LOW (ref 32.0–36.0)
MONO#: 0.4 10*3/uL (ref 0.1–0.9)
RDW: 16.9 % — ABNORMAL HIGH (ref 11.0–14.6)
WBC: 3.2 10*3/uL — ABNORMAL LOW (ref 4.0–10.3)
lymph#: 0.9 10*3/uL (ref 0.9–3.3)

## 2011-06-05 LAB — COMPREHENSIVE METABOLIC PANEL
AST: 14 U/L (ref 0–37)
Albumin: 4.1 g/dL (ref 3.5–5.2)
Alkaline Phosphatase: 39 U/L (ref 39–117)
Potassium: 3.6 mEq/L (ref 3.5–5.3)
Sodium: 135 mEq/L (ref 135–145)
Total Protein: 6 g/dL (ref 6.0–8.3)

## 2011-06-05 NOTE — Telephone Encounter (Signed)
gave patient appointment for 07-03-2011 starting at 10:15am printed out calendar and gave to the patient

## 2011-06-05 NOTE — Telephone Encounter (Signed)
gave patient appointment for 07-03-2011 starting at 10:15am printed out calendar and gave to the patient 

## 2011-06-08 NOTE — Progress Notes (Signed)
[facility] Telephone:(336) [phone]   Fax:(336) [phone]  OFFICE PROGRESS NOTE  [place], [place], [place] [address] 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   PRIOR THERAPY: :  1. Status post 6 cycles of systemic chemotherapy with Revlimid and Decadron, last dose was given 07/21/2010 with very good response. 2. 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 Lance Bosch.  3. maintenance Revlimid at 10 mg by mouth daily status post 2 months. Therapy began 01/18/2011.  CURRENT THERAPY:  1. Zometa 4 mg IV every 4 weeks.  2. maintenance Revlimid at 15 mg by mouth daily  3. prophylactic dose Coumadin at 2 mg by mouth daily   INTERVAL HISTORY: Eddie Thomas. 67 y.o. male returns to the clinic today for routine followup visit. The patient continues to tolerate his treatment with Revlimid fairly well. He denied having any significant weight loss or night sweats. No peripheral neuropathy. He has no nausea or vomiting, no constipation. He has no chest pain or shortness of breath.He does have occasional diarrhea, well managed with Imodium.He does report that he is losing his taste for food. He notes a discoloration/hyperpigmentation to the palms of his hands.   MEDICAL HISTORY: Past Medical History  Diagnosis Date  . Multiple myeloma   . Diabetes mellitus   . High cholesterol     ALLERGIES:   has no known allergies.  MEDICATIONS:  Current Outpatient Prescriptions  Medication Sig Dispense Refill  . ezetimibe (ZETIA) 10 MG tablet Take 10 mg by mouth daily.        [name] lenalidomide (REVLIMID) 15 MG capsule Take 1 capsule (15 mg total) by mouth daily. Do not break, chew or open capsules.  May cause dizziness.  Auth # [id number]  28 capsule  0  . lisinopril (PRINIVIL,ZESTRIL) 10 MG tablet Take 10 mg by mouth daily.        . metFORMIN (GLUCOPHAGE) 500  MG tablet Take 500 mg by mouth daily with breakfast.       . simvastatin (ZOCOR) 10 MG tablet Take 10 mg by mouth at bedtime.        . valACYclovir (VALTREX) 500 MG tablet Take 500 mg by mouth daily.        . Vitamin D, Ergocalciferol, (DRISDOL) 50000 UNITS CAPS       . warfarin (COUMADIN) 2 MG tablet Take 2 mg by mouth daily.          SURGICAL HISTORY:  Past Surgical History  Procedure Date  . Limbal stem cell transplant     REVIEW OF SYSTEMS:  Pertinent items are noted in HPI.   PHYSICAL EXAMINATION: General appearance: alert, cooperative and no distress Neck: no adenopathy Lymph nodes: Cervical, supraclavicular, and axillary nodes normal. Resp: clear to auscultation bilaterally Cardio: regular rate and rhythm, S1, S2 normal, no murmur, click, rub or gallop GI: soft, non-tender; bowel sounds normal; no masses,  no organomegaly Extremities: extremities normal, atraumatic, no cyanosis or edema Neurologic: Alert and oriented X 3, normal strength and tone. Normal symmetric reflexes. Normal coordination and gait Skin: palms of hands are mottled with hyperpigmentation Mouth: negative for thrush  ECOG PERFORMANCE STATUS: 0 - Asymptomatic  Blood pressure 110/58, pulse 53, temperature 97.6 F (36.4 C), temperature source Oral, height 6\' 1"  (1.854 m), weight 169 lb (76.658 kg).  LABORATORY DATA: Lab Results  Component Value  Date   WBC 3.2* 06/05/2011   HGB 11.0* 06/05/2011   HCT 34.7* 06/05/2011   MCV 75.7* 06/05/2011   PLT 153 06/05/2011      Chemistry      Component Value Date/Time   NA 135 06/05/2011 1115   K 3.6 06/05/2011 1115   CL 100 06/05/2011 1115   CO2 29 06/05/2011 1115   BUN 9 06/05/2011 1115   CREATININE 0.85 06/05/2011 1115      Component Value Date/Time   CALCIUM 8.8 06/05/2011 1115   ALKPHOS 39 06/05/2011 1115   AST 14 06/05/2011 1115   ALT 14 06/05/2011 1115   BILITOT 2.3* 06/05/2011 1115       Lab STUDIES: Beta-2 microglobulin 1.69, Free kappa light chain 1.13,  free lambda light chain 1.72 with a kappa/lambda ratio of 0.66   ASSESSMENT/PLAN: [name] with multiple myeloma currently on maintenance chemotherapy with Revlimid at 15 mg by mouth daily. The patient is tolerating his treatment fairly well. The patient was discussed with Dr. Arbutus Ped. He will continue on his maintenance chemotherapy with Revlimid and prophylactic dose Coumadin. He had no evidence for disease progression on the most  recent blood work. He will return in one month with a repeat CBC Differential, CMET, and LDH. The hyperpigmentation is likely secondary to the Revlimid. We will continue to monitor this on subsequent visits.  Tiana Loft E', PA-C  All questions were answered. The patient knows to call the clinic with any problems, questions or concerns. We can certainly see the patient much sooner if necessary.  I spent 20 minutes counseling the patient face to face. The total time spent in the appointment was 30 minutes.

## 2011-06-18 ENCOUNTER — Ambulatory Visit (HOSPITAL_BASED_OUTPATIENT_CLINIC_OR_DEPARTMENT_OTHER): Payer: Medicare Other

## 2011-06-18 DIAGNOSIS — C9000 Multiple myeloma not having achieved remission: Secondary | ICD-10-CM

## 2011-06-18 MED ORDER — ZOLEDRONIC ACID 4 MG/100ML IV SOLN
4.0000 mg | Freq: Once | INTRAVENOUS | Status: AC
  Administered 2011-06-18: 4 mg via INTRAVENOUS
  Filled 2011-06-18: qty 100

## 2011-06-18 NOTE — Patient Instructions (Signed)
[facility] Discharge Instructions  Today you received the following : Zometa  BELOW ARE SYMPTOMS THAT SHOULD BE REPORTED IMMEDIATELY:  *FEVER GREATER THAN 100.5 F  *CHILLS WITH OR WITHOUT FEVER  NAUSEA AND VOMITING THAT IS NOT CONTROLLED WITH YOUR NAUSEA MEDICATION  *UNUSUAL SHORTNESS OF BREATH  *UNUSUAL BRUISING OR BLEEDING  TENDERNESS IN MOUTH AND THROAT WITH OR WITHOUT PRESENCE OF ULCERS  *URINARY PROBLEMS  *BOWEL PROBLEMS  UNUSUAL RASH Items with * indicate a potential emergency and should be followed up as soon as possible.   Feel free to call the clinic you have any questions or concerns. The clinic phone number is [phone].   I have been informed and understand all the instructions given to me. I know to contact the clinic, my physician, or go to the Emergency Department if any problems should occur. I do not have any questions at this time, but understand that I may call the clinic during office hours   should I have any questions or need assistance in obtaining follow up care.    __________________________________________  _____________  __________ Signature of Patient or Authorized Representative            Date                   Time    __________________________________________ Nurse's Signature

## 2011-06-25 ENCOUNTER — Other Ambulatory Visit: Payer: Self-pay | Admitting: *Deleted

## 2011-06-25 DIAGNOSIS — C9000 Multiple myeloma not having achieved remission: Secondary | ICD-10-CM

## 2011-06-25 MED ORDER — LENALIDOMIDE 15 MG PO CAPS: 15.0000 mg | ORAL_CAPSULE | Freq: Every day | ORAL | Status: DC

## 2011-06-25 NOTE — Telephone Encounter (Signed)
Called PATIENT AND INSTRUCTED HIM TO TAKE PATIENT SURVEY FOR THIS REFILL TO BE VALID.

## 2011-07-02 ENCOUNTER — Other Ambulatory Visit: Payer: Self-pay | Admitting: Internal Medicine

## 2011-07-03 ENCOUNTER — Telehealth: Payer: Self-pay | Admitting: Internal Medicine

## 2011-07-03 ENCOUNTER — Other Ambulatory Visit (HOSPITAL_BASED_OUTPATIENT_CLINIC_OR_DEPARTMENT_OTHER): Payer: Medicare Other | Admitting: Lab

## 2011-07-03 ENCOUNTER — Ambulatory Visit (HOSPITAL_BASED_OUTPATIENT_CLINIC_OR_DEPARTMENT_OTHER): Payer: Medicare Other | Admitting: Physician Assistant

## 2011-07-03 ENCOUNTER — Encounter: Payer: Self-pay | Admitting: Physician Assistant

## 2011-07-03 VITALS — BP 99/59 | HR 53 | Temp 97.3°F | Ht 73.0 in | Wt 169.0 lb

## 2011-07-03 DIAGNOSIS — C9000 Multiple myeloma not having achieved remission: Secondary | ICD-10-CM

## 2011-07-03 DIAGNOSIS — L819 Disorder of pigmentation, unspecified: Secondary | ICD-10-CM

## 2011-07-03 DIAGNOSIS — I10 Essential (primary) hypertension: Secondary | ICD-10-CM

## 2011-07-03 DIAGNOSIS — E119 Type 2 diabetes mellitus without complications: Secondary | ICD-10-CM

## 2011-07-03 DIAGNOSIS — E785 Hyperlipidemia, unspecified: Secondary | ICD-10-CM

## 2011-07-03 HISTORY — DX: Hyperlipidemia, unspecified: E78.5

## 2011-07-03 HISTORY — DX: Essential (primary) hypertension: I10

## 2011-07-03 HISTORY — DX: Type 2 diabetes mellitus without complications: E11.9

## 2011-07-03 LAB — CBC WITH DIFFERENTIAL/PLATELET
BASO%: 0.4 % (ref 0.0–2.0)
LYMPH%: 29.8 % (ref 14.0–49.0)
MCH: 23.8 pg — ABNORMAL LOW (ref 27.2–33.4)
MCHC: 31.4 g/dL — ABNORMAL LOW (ref 32.0–36.0)
MCV: 75.7 fL — ABNORMAL LOW (ref 79.3–98.0)
MONO%: 12.8 % (ref 0.0–14.0)
Platelets: 150 10*3/uL (ref 140–400)
RBC: 4.32 10*6/uL (ref 4.20–5.82)

## 2011-07-03 LAB — COMPREHENSIVE METABOLIC PANEL
ALT: 17 U/L (ref 0–53)
AST: 15 U/L (ref 0–37)
Albumin: 4.1 g/dL (ref 3.5–5.2)
Alkaline Phosphatase: 39 U/L (ref 39–117)
Potassium: 4 mEq/L (ref 3.5–5.3)
Sodium: 138 mEq/L (ref 135–145)
Total Bilirubin: 2.4 mg/dL — ABNORMAL HIGH (ref 0.3–1.2)
Total Protein: 5.9 g/dL — ABNORMAL LOW (ref 6.0–8.3)

## 2011-07-03 NOTE — Telephone Encounter (Signed)
appts made and printed for pt aom °

## 2011-07-03 NOTE — Progress Notes (Signed)
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   PRIOR THERAPY: :  1. Status post 6 cycles of systemic chemotherapy with Revlimid and Decadron, last dose was given 07/21/2010 with very good response. 2. 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 Lance Bosch.  3. maintenance Revlimid at 10 mg by mouth daily status post 2 months. Therapy began 01/18/2011.  CURRENT THERAPY:  1. Zometa 4 mg IV every 4 weeks.  2. maintenance Revlimid at 15 mg by mouth daily  3. prophylactic dose Coumadin at 2 mg by mouth daily   INTERVAL HISTORY: Eddie Thomas. 66 y.o. male returns to the clinic today for routine followup visit. The patient continues to tolerate his treatment with Revlimid fairly well. He does report some occasional episodes of diarrhea but states that it is manageable. He has not noticed any blood. He continues to notice some discoloration/hyperpigmentation to the palms of his hands and to a lesser extent to the finger nail beds. He reports that he has an upcoming colonoscopy and as instructed we'll discontinue his Coumadin for 5 days prior to the study. He denied having any significant weight loss or night sweats. No peripheral neuropathy. He has no nausea or vomiting, no constipation. He has no chest pain or shortness of breath.  MEDICAL HISTORY: Past Medical History  Diagnosis Date  . Multiple myeloma   . Diabetes mellitus   . High cholesterol   . Diabetes mellitus 07/03/2011  . Hypertension 07/03/2011  . Hyperlipidemia 07/03/2011    ALLERGIES:   has no known allergies.  MEDICATIONS:  Current Outpatient Prescriptions  Medication Sig Dispense Refill  . ezetimibe (ZETIA) 10 MG tablet Take 10 mg by mouth daily.        . Lancets  (ACCU-CHEK MULTICLIX) lancets       . lenalidomide (REVLIMID) 15 MG capsule Take 1 capsule (15 mg total) by mouth daily. Do not break, chew or open capsules.  May cause dizziness.  Auth # [id number]  28 capsule  0  . lisinopril (PRINIVIL,ZESTRIL) 10 MG tablet Take 10 mg by mouth daily.        . metFORMIN (GLUCOPHAGE) 500 MG tablet Take 500 mg by mouth daily with breakfast.       . simvastatin (ZOCOR) 10 MG tablet Take 10 mg by mouth at bedtime.        . valACYclovir (VALTREX) 500 MG tablet Take 500 mg by mouth daily.        . Vitamin D, Ergocalciferol, (DRISDOL) 50000 UNITS CAPS       . warfarin (COUMADIN) 2 MG tablet Take 2 mg by mouth daily.          SURGICAL HISTORY:  Past Surgical History  Procedure Date  . Limbal stem cell transplant     REVIEW OF SYSTEMS:  Pertinent items are noted in HPI.   PHYSICAL EXAMINATION: General appearance: alert, cooperative and no distress Neck: no adenopathy Lymph nodes: Cervical, supraclavicular, and axillary nodes normal. Resp: clear to auscultation bilaterally Cardio: regular rate and rhythm, S1, S2 normal, no murmur, click, rub or gallop GI: soft, non-tender; bowel sounds normal; no masses,  no organomegaly Extremities: extremities normal, atraumatic, no cyanosis or edema Neurologic: Alert and oriented X 3, normal strength and tone.  Normal symmetric reflexes. Normal coordination and gait Skin: palms of hands are mottled with hyperpigmentation, some hyperpigmentation of the nail beds also present Mouth: negative for thrush  ECOG PERFORMANCE STATUS: 0 - Asymptomatic  Blood pressure 99/59, pulse 53, temperature 97.3 F (36.3 C), temperature source Oral, height 6\' 1"  (1.854 m), weight 169 lb (76.658 kg).  LABORATORY DATA: Lab Results  Component Value Date   WBC 3.0* 07/03/2011   HGB 10.3* 07/03/2011   HCT 32.7* 07/03/2011   MCV 75.7* 07/03/2011   PLT 150 07/03/2011      Chemistry      Component Value Date/Time   NA 135 06/05/2011 1115   K 3.6  06/05/2011 1115   CL 100 06/05/2011 1115   CO2 29 06/05/2011 1115   BUN 9 06/05/2011 1115   CREATININE 0.85 06/05/2011 1115      Component Value Date/Time   CALCIUM 8.8 06/05/2011 1115   ALKPHOS 39 06/05/2011 1115   AST 14 06/05/2011 1115   ALT 14 06/05/2011 1115   BILITOT 2.3* 06/05/2011 1115       Lab STUDIES: Beta-2 microglobulin 1.69, Free kappa light chain 1.13, free lambda light chain 1.72 with a kappa/lambda ratio of 0.66   ASSESSMENT/PLAN: [name] with multiple myeloma currently on maintenance chemotherapy with Revlimid at 15 mg by mouth daily. The patient is tolerating his treatment fairly well. The patient was discussed with Dr. Truett Perna in Dr. Asa Lente absence. He will continue on his maintenance chemotherapy with Revlimid and prophylactic dose Coumadin. He'll followup with Dr. Gwenyth Bouillon in one month with a repeat CBC differential C. met and LDH as well as a quantitative immunoglobin, beta 2 microglobulin, and serum light chains to reevaluate his disease. He will continue to receive his Zometa on a monthly basis.  The hyperpigmentation is likely secondary to the Revlimid. We will continue to monitor this on subsequent visits. He is to proceed with colonoscopy as scheduled, discontinuing the Coumadin for 5 days prior to the study.  Tiana Loft E', PA-C  All questions were answered. The patient knows to call the clinic with any problems, questions or concerns. We can certainly see the patient much sooner if necessary.  I spent 20 minutes counseling the patient face to face. The total time spent in the appointment was 30 minutes.

## 2011-07-12 ENCOUNTER — Other Ambulatory Visit: Payer: Self-pay | Admitting: Internal Medicine

## 2011-07-15 ENCOUNTER — Telehealth: Payer: Self-pay | Admitting: Medical Oncology

## 2011-07-15 DIAGNOSIS — Z299 Encounter for prophylactic measures, unspecified: Secondary | ICD-10-CM

## 2011-07-15 MED ORDER — WARFARIN SODIUM 2 MG PO TABS: 2.0000 mg | ORAL_TABLET | Freq: Every day | ORAL | Status: DC

## 2011-07-15 NOTE — Telephone Encounter (Signed)
Called to pharmacy and pt. 

## 2011-07-16 ENCOUNTER — Other Ambulatory Visit: Payer: Medicare Other | Admitting: Lab

## 2011-07-16 ENCOUNTER — Other Ambulatory Visit: Payer: Self-pay | Admitting: Physician Assistant

## 2011-07-16 ENCOUNTER — Ambulatory Visit (HOSPITAL_BASED_OUTPATIENT_CLINIC_OR_DEPARTMENT_OTHER): Payer: Medicare Other

## 2011-07-16 VITALS — BP 119/67 | HR 52 | Temp 98.2°F

## 2011-07-16 DIAGNOSIS — C9000 Multiple myeloma not having achieved remission: Secondary | ICD-10-CM

## 2011-07-16 LAB — BASIC METABOLIC PANEL
Calcium: 9.5 mg/dL (ref 8.4–10.5)
Sodium: 139 mEq/L (ref 135–145)

## 2011-07-16 MED ORDER — SODIUM CHLORIDE 0.9 % IV SOLN
INTRAVENOUS | Status: DC
  Administered 2011-07-16: 14:00:00 via INTRAVENOUS

## 2011-07-16 MED ORDER — ZOLEDRONIC ACID 4 MG/100ML IV SOLN
4.0000 mg | Freq: Once | INTRAVENOUS | Status: AC
  Administered 2011-07-16: 4 mg via INTRAVENOUS
  Filled 2011-07-16: qty 100

## 2011-07-16 NOTE — Patient Instructions (Signed)
Zometa (Zoledronic Acid) What is this medicine? ZOLEDRONIC ACID (ZOE le dron ik AS id) lowers the amount of calcium loss from bone. It is used to treat too much calcium in your blood from cancer. It is also used to prevent complications of cancer that has spread to the bone. This medicine may be used for other purposes; ask your health care provider or pharmacist if you have questions.  What should I tell my health care provider before I take this medicine? They need to know if you have any of these conditions: -aspirin-sensitive asthma -dental disease -kidney disease -an unusual or allergic reaction to zoledronic acid, other medicines, foods, dyes, or preservatives -pregnant or trying to get pregnant -breast-feeding  How should I use this medicine? This medicine is for infusion into a vein. It is given by a health care professional in a hospital or clinic setting. Talk to your pediatrician regarding the use of this medicine in children. Special care may be needed. Overdosage: If you think you have taken too much of this medicine contact a poison control center or emergency room at once. NOTE: This medicine is only for you. Do not share this medicine with others. What if I miss a dose? It is important not to miss your dose. Call your doctor or health care professional if you are unable to keep an appointment.  What may interact with this medicine? -certain antibiotics given by injection -NSAIDs, medicines for pain and inflammation, like ibuprofen or naproxen -some diuretics like bumetanide, furosemide -teriparatide -thalidomide This list may not describe all possible interactions. Give your health care provider a list of all the medicines, herbs, non-prescription drugs, or dietary supplements you use. Also tell them if you smoke, drink alcohol, or use illegal drugs. Some items may interact with your medicine.  What should I watch for while using this medicine? Visit your doctor or  health care professional for regular checkups. It may be some time before you see the benefit from this medicine. Do not stop taking your medicine unless your doctor tells you to. Your doctor may order blood tests or other tests to see how you are doing. Women should inform their doctor if they wish to become pregnant or think they might be pregnant. There is a potential for serious side effects to an unborn child. Talk to your health care professional or pharmacist for more information. You should make sure that you get enough calcium and vitamin D while you are taking this medicine. Discuss the foods you eat and the vitamins you take with your health care professional. Some people who take this medicine have severe bone, joint, and/or muscle pain. This medicine may also increase your risk for a broken thigh bone. Tell your doctor right away if you have pain in your upper leg or groin. Tell your doctor if you have any pain that does not go away or that gets worse.  What side effects may I notice from receiving this medicine? Side effects that you should report to your doctor or health care professional as soon as possible: -allergic reactions like skin rash, itching or hives, swelling of the face, lips, or tongue -anxiety, confusion, or depression -breathing problems -changes in vision -feeling faint or lightheaded, falls -jaw burning, cramping, pain -muscle cramps, stiffness, or weakness -trouble passing urine or change in the amount of urine  Side effects that usually do not require medical attention (report to your doctor or health care professional if they continue or are bothersome): -bone,   joint, or muscle pain -fever -hair loss -irritation at site where injected -loss of appetite -nausea, vomiting -stomach upset -tired This list may not describe all possible side effects. Call your doctor for medical advice about side effects. You may report side effects to FDA at  1-800-FDA-1088. Where should I keep my medicine? This drug is given in a hospital or clinic and will not be stored at home. NOTE: This sheet is a summary. It may not cover all possible information. If you have questions about this medicine, talk to your doctor, pharmacist, or health care provider.  2012, [employer]vier/Gold Standard. (07/05/2010 9:06:58 AM) 

## 2011-07-17 ENCOUNTER — Telehealth: Payer: Self-pay | Admitting: Medical Oncology

## 2011-07-17 NOTE — Telephone Encounter (Signed)
I called [facility] -pt is due for refill and they will fax rx to our office . I called pt about this.

## 2011-07-17 NOTE — Telephone Encounter (Signed)
Pt notifed that we will send in new rx for revlimid

## 2011-07-21 ENCOUNTER — Other Ambulatory Visit: Payer: Medicare Other | Admitting: Lab

## 2011-07-21 DIAGNOSIS — C9000 Multiple myeloma not having achieved remission: Secondary | ICD-10-CM

## 2011-07-21 LAB — CBC WITH DIFFERENTIAL/PLATELET
BASO%: 0.7 % (ref 0.0–2.0)
Basophils Absolute: 0 10*3/uL (ref 0.0–0.1)
HCT: 33.8 % — ABNORMAL LOW (ref 38.4–49.9)
HGB: 10.9 g/dL — ABNORMAL LOW (ref 13.0–17.1)
MCHC: 32.1 g/dL (ref 32.0–36.0)
MONO#: 0.5 10*3/uL (ref 0.1–0.9)
NEUT%: 54.1 % (ref 39.0–75.0)
RDW: 15.9 % — ABNORMAL HIGH (ref 11.0–14.6)
WBC: 3.7 10*3/uL — ABNORMAL LOW (ref 4.0–10.3)
lymph#: 1 10*3/uL (ref 0.9–3.3)

## 2011-07-22 ENCOUNTER — Other Ambulatory Visit: Payer: Medicare Other | Admitting: Lab

## 2011-07-28 LAB — SPEP & IFE WITH QIG
Albumin ELP: 62.6 % (ref 55.8–66.1)
Alpha-1-Globulin: 5.3 % — ABNORMAL HIGH (ref 2.9–4.9)
Beta 2: 6.5 % (ref 3.2–6.5)
IgA: 283 mg/dL (ref 68–379)
IgM, Serum: 11 mg/dL — ABNORMAL LOW (ref 41–251)
Total Protein, Serum Electrophoresis: 6.3 g/dL (ref 6.0–8.3)

## 2011-07-28 LAB — COMPREHENSIVE METABOLIC PANEL
ALT: 18 U/L (ref 0–53)
Albumin: 4.3 g/dL (ref 3.5–5.2)
CO2: 26 mEq/L (ref 19–32)
Calcium: 9.1 mg/dL (ref 8.4–10.5)
Chloride: 101 mEq/L (ref 96–112)
Creatinine, Ser: 0.81 mg/dL (ref 0.50–1.35)
Potassium: 3.9 mEq/L (ref 3.5–5.3)

## 2011-07-28 LAB — KAPPA/LAMBDA LIGHT CHAINS: Lambda Free Lght Chn: 3.44 mg/dL — ABNORMAL HIGH (ref 0.57–2.63)

## 2011-07-28 LAB — LACTATE DEHYDROGENASE: LDH: 100 U/L (ref 94–250)

## 2011-07-31 ENCOUNTER — Telehealth: Payer: Self-pay | Admitting: Internal Medicine

## 2011-07-31 ENCOUNTER — Ambulatory Visit (HOSPITAL_BASED_OUTPATIENT_CLINIC_OR_DEPARTMENT_OTHER): Payer: Medicare Other | Admitting: Internal Medicine

## 2011-07-31 VITALS — BP 128/59 | HR 53 | Temp 98.0°F | Ht 73.0 in | Wt 167.6 lb

## 2011-07-31 DIAGNOSIS — C9000 Multiple myeloma not having achieved remission: Secondary | ICD-10-CM

## 2011-07-31 NOTE — Telephone Encounter (Signed)
gve the pt his aug 2013 appt calendar °

## 2011-07-31 NOTE — Progress Notes (Signed)
[facility] Telephone:([phone]   Fax:(336) [phone]  OFFICE PROGRESS NOTE  Eddie Lean, PA [address] 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   PRIOR THERAPY: :  1. Status post 6 cycles of systemic chemotherapy with Revlimid and Decadron, last dose was given 07/21/2010 with very good response. 2. 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 Lance Bosch.  3. maintenance Revlimid at 10 mg by mouth daily status post 2 months. Therapy began 01/18/2011.  CURRENT THERAPY:  1. Zometa 4 mg IV every 4 weeks.  2. maintenance Revlimid at 15 mg by mouth daily  3. prophylactic dose Coumadin at 2 mg by mouth daily   INTERVAL HISTORY: Eddie Thomas. 66 y.o. male returns to the clinic today for routine followup visit. The patient is doing fine and tolerating his maintenance treatment with Revlimid and Zometa fairly well. He denied having any significant weight loss or night sweats. He has no significant bony aches or pains. He has no chest pain or shortness of breath. The patient has repeat CBC, comprehensive metabolic panel, LDH and myeloma panel performed recently and he is here today for evaluation and discussion of his lab results.  MEDICAL HISTORY: Past Medical History  Diagnosis Date  . Multiple myeloma   . Diabetes mellitus   . High cholesterol   . Diabetes mellitus 07/03/2011  . Hypertension 07/03/2011  . Hyperlipidemia 07/03/2011    ALLERGIES:   has no known allergies.  MEDICATIONS:  Current Outpatient Prescriptions  Medication Sig Dispense Refill  . ezetimibe (ZETIA) 10 MG tablet Take 10 mg by mouth daily.        . Lancets (ACCU-CHEK MULTICLIX) lancets       . lenalidomide (REVLIMID) 15 MG capsule Take 1 capsule (15 mg total) by mouth daily. Do not break, chew or open capsules.  May cause dizziness.  Auth # [id number]  28  capsule  0  . lisinopril (PRINIVIL,ZESTRIL) 10 MG tablet Take 10 mg by mouth daily.        . metFORMIN (GLUCOPHAGE) 500 MG tablet Take 500 mg by mouth daily with breakfast.       . simvastatin (ZOCOR) 10 MG tablet Take 10 mg by mouth at bedtime.        . valACYclovir (VALTREX) 500 MG tablet Take 500 mg by mouth daily.        [name] warfarin (COUMADIN) 2 MG tablet Take 1 tablet (2 mg total) by mouth daily.  30 tablet  3    SURGICAL HISTORY:  Past Surgical History  Procedure Date  . Limbal stem cell transplant     REVIEW OF SYSTEMS:  A comprehensive review of systems was negative.   PHYSICAL EXAMINATION: General appearance: alert, cooperative and no distress Head: Normocephalic, without obvious abnormality, atraumatic Neck: no adenopathy Lymph nodes: Cervical, supraclavicular, and axillary nodes normal. Resp: clear to auscultation bilaterally Cardio: regular rate and rhythm, S1, S2 normal, no murmur, click, rub or gallop GI: soft, non-tender; bowel sounds normal; no masses,  no organomegaly Extremities: extremities normal, atraumatic, no cyanosis or edema Neurologic: Alert and oriented X 3, normal strength and tone. Normal symmetric reflexes. Normal coordination and gait  ECOG PERFORMANCE STATUS: 0 - Asymptomatic  Blood pressure 128/59, pulse 53, temperature 98 F (36.7 C), temperature source Oral, height 6\' 1"  (1.854 m), weight 167 lb 9.6 oz (76.023 kg).  LABORATORY DATA: Lab Results  Component Value Date   WBC 3.7* 07/21/2011   HGB 10.9* 07/21/2011   HCT 33.8* 07/21/2011   MCV 76.0* 07/21/2011   PLT 164 07/21/2011      Chemistry      Component Value Date/Time   NA 140 07/21/2011 0954   K 3.9 07/21/2011 0954   CL 101 07/21/2011 0954   CO2 26 07/21/2011 0954   BUN 9 07/21/2011 0954   CREATININE 0.81 07/21/2011 0954      Component Value Date/Time   CALCIUM 9.1 07/21/2011 0954   ALKPHOS 43 07/21/2011 0954   AST 14 07/21/2011 0954   ALT 18 07/21/2011 0954   BILITOT 1.9* 07/21/2011 0954        RADIOGRAPHIC STUDIES: No results found.  ASSESSMENT: [name] with history of multiple myeloma currently on maintenance Revlimid as well as Zometa for bone disease. The patient is doing fine and his blood work showed no significant evidence for disease progression except for mildly increased free lambda light chain.  PLAN: I discussed the lab result with the patient. I recommended for him to continue on the same treatment for now. I will continue to monitor his blood work closely. He would come back for followup visit in one month's for reevaluation. He was advised to call me immediately if he has any concerning symptoms in the interval.  All questions were answered. The patient knows to call the clinic with any problems, questions or concerns. We can certainly see the patient much sooner if necessary.  I spent 15 minutes counseling the patient face to face. The total time spent in the appointment was 25 minutes.

## 2011-08-13 ENCOUNTER — Ambulatory Visit (HOSPITAL_BASED_OUTPATIENT_CLINIC_OR_DEPARTMENT_OTHER): Payer: Medicare Other

## 2011-08-13 ENCOUNTER — Telehealth: Payer: Self-pay | Admitting: Medical Oncology

## 2011-08-13 ENCOUNTER — Other Ambulatory Visit: Payer: Medicare Other

## 2011-08-13 DIAGNOSIS — C9000 Multiple myeloma not having achieved remission: Secondary | ICD-10-CM

## 2011-08-13 MED ORDER — ZOLEDRONIC ACID 4 MG/100ML IV SOLN
4.0000 mg | Freq: Once | INTRAVENOUS | Status: AC
  Administered 2011-08-13: 4 mg via INTRAVENOUS
  Filled 2011-08-13: qty 100

## 2011-08-13 NOTE — Telephone Encounter (Addendum)
cmet specimen hemolyzed.  Next lab in august

## 2011-08-13 NOTE — Patient Instructions (Signed)
Pt will call with any problems 

## 2011-08-19 ENCOUNTER — Other Ambulatory Visit: Payer: Self-pay | Admitting: *Deleted

## 2011-08-19 DIAGNOSIS — C9000 Multiple myeloma not having achieved remission: Secondary | ICD-10-CM

## 2011-08-19 MED ORDER — LENALIDOMIDE 15 MG PO CAPS: 15.0000 mg | ORAL_CAPSULE | Freq: Every day | ORAL | Status: DC

## 2011-08-19 NOTE — Addendum Note (Signed)
Addended by: Arvilla Meres on: 08/19/2011 05:10 PM   Modules accepted: Orders

## 2011-08-19 NOTE — Telephone Encounter (Signed)
THIS REFILL REQUEST FOR REVLIMID WAS PLACED ON DR.MOHAMED'S DESK. 

## 2011-08-20 ENCOUNTER — Telehealth: Payer: Self-pay | Admitting: Medical Oncology

## 2011-08-20 NOTE — Telephone Encounter (Signed)
HE will do his revlimid survey tomorrow

## 2011-09-02 ENCOUNTER — Other Ambulatory Visit (HOSPITAL_BASED_OUTPATIENT_CLINIC_OR_DEPARTMENT_OTHER): Payer: Medicare Other | Admitting: Lab

## 2011-09-02 ENCOUNTER — Ambulatory Visit (HOSPITAL_BASED_OUTPATIENT_CLINIC_OR_DEPARTMENT_OTHER): Payer: Medicare Other | Admitting: Physician Assistant

## 2011-09-02 ENCOUNTER — Telehealth: Payer: Self-pay | Admitting: Internal Medicine

## 2011-09-02 ENCOUNTER — Encounter: Payer: Self-pay | Admitting: Physician Assistant

## 2011-09-02 ENCOUNTER — Telehealth: Payer: Self-pay | Admitting: *Deleted

## 2011-09-02 VITALS — BP 100/59 | HR 55 | Temp 97.7°F | Resp 20 | Ht 73.0 in | Wt 170.8 lb

## 2011-09-02 DIAGNOSIS — C9000 Multiple myeloma not having achieved remission: Secondary | ICD-10-CM

## 2011-09-02 LAB — COMPREHENSIVE METABOLIC PANEL
ALT: 31 U/L (ref 0–53)
AST: 21 U/L (ref 0–37)
Albumin: 3.9 g/dL (ref 3.5–5.2)
Alkaline Phosphatase: 43 U/L (ref 39–117)
BUN: 7 mg/dL (ref 6–23)
CO2: 28 mEq/L (ref 19–32)
Calcium: 8.7 mg/dL (ref 8.4–10.5)
Chloride: 104 mEq/L (ref 96–112)
Creatinine, Ser: 0.93 mg/dL (ref 0.50–1.35)
Glucose, Bld: 141 mg/dL — ABNORMAL HIGH (ref 70–99)
Potassium: 3.7 mEq/L (ref 3.5–5.3)
Sodium: 139 mEq/L (ref 135–145)
Total Bilirubin: 2.3 mg/dL — ABNORMAL HIGH (ref 0.3–1.2)
Total Protein: 6.1 g/dL (ref 6.0–8.3)

## 2011-09-02 LAB — CBC WITH DIFFERENTIAL/PLATELET
BASO%: 1.2 % (ref 0.0–2.0)
Basophils Absolute: 0 10*3/uL (ref 0.0–0.1)
EOS%: 5.6 % (ref 0.0–7.0)
HCT: 32.9 % — ABNORMAL LOW (ref 38.4–49.9)
HGB: 10.3 g/dL — ABNORMAL LOW (ref 13.0–17.1)
LYMPH%: 28.6 % (ref 14.0–49.0)
MCH: 24.3 pg — ABNORMAL LOW (ref 27.2–33.4)
MCHC: 31.4 g/dL — ABNORMAL LOW (ref 32.0–36.0)
MONO#: 0.5 10*3/uL (ref 0.1–0.9)
NEUT%: 46.7 % (ref 39.0–75.0)
Platelets: 153 10*3/uL (ref 140–400)
lymph#: 0.8 10*3/uL — ABNORMAL LOW (ref 0.9–3.3)

## 2011-09-02 LAB — LACTATE DEHYDROGENASE: LDH: 105 U/L (ref 94–250)

## 2011-09-02 NOTE — Telephone Encounter (Signed)
appts made and printed for pt ,pt aware that i will call with zometa appt for 9/18

## 2011-09-02 NOTE — Progress Notes (Signed)
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   PRIOR THERAPY: :  1. Status post 6 cycles of systemic chemotherapy with Revlimid and Decadron, last dose was given 07/21/2010 with very good response. 2. 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 Lance Bosch.  3. maintenance Revlimid at 10 mg by mouth daily status post 2 months. Therapy began 01/18/2011.  CURRENT THERAPY:  1. Zometa 4 mg IV every 4 weeks.  2. maintenance Revlimid at 15 mg by mouth daily  3. prophylactic dose Coumadin at 2 mg by mouth daily   INTERVAL HISTORY: Eddie Thomas. 66 y.o. male returns to the clinic today for routine followup visit. The patient is doing fine and tolerating his maintenance treatment with Revlimid and Zometa fairly well. He denied having any significant weight loss or night sweats. He complains of right mid thigh "burning and stinging" with some achiness. He's had some similar symptoms in the left mid thigh but these seem to have resolved. The symptoms have been present intermittently over the past 3 weeks.  He denied chest pain or shortness of breath. He reports that he is to followup with Dr. Artis Flock on 09/30/2011 for his one-year followup from his peripheral blood autologous stem cell transplant. He also states that he is due to see his primary care physician on 09/05/2011.   MEDICAL HISTORY: Past Medical History  Diagnosis Date  . Multiple myeloma   . Diabetes mellitus   . High cholesterol   . Diabetes mellitus 07/03/2011  . Hypertension 07/03/2011  . Hyperlipidemia 07/03/2011    ALLERGIES:   has no known allergies.  MEDICATIONS:  Current Outpatient Prescriptions  Medication Sig Dispense Refill  . ezetimibe (ZETIA) 10 MG  tablet Take 10 mg by mouth daily.        . Lancets (ACCU-CHEK MULTICLIX) lancets       . lenalidomide (REVLIMID) 15 MG capsule Take 1 capsule (15 mg total) by mouth daily. Do not break, chew or open capsules.  May cause dizziness.  28 capsule  0  . lisinopril (PRINIVIL,ZESTRIL) 10 MG tablet Take 10 mg by mouth daily.        . metFORMIN (GLUCOPHAGE) 500 MG tablet Take 500 mg by mouth daily with breakfast.       . simvastatin (ZOCOR) 10 MG tablet Take 10 mg by mouth at bedtime.        . valACYclovir (VALTREX) 500 MG tablet Take 500 mg by mouth daily.        [name] warfarin (COUMADIN) 2 MG tablet Take 1 tablet (2 mg total) by mouth daily.  30 tablet  3    SURGICAL HISTORY:  Past Surgical History  Procedure Date  . Limbal stem cell transplant     REVIEW OF SYSTEMS:  A comprehensive review of systems was negative except for: Musculoskeletal: positive for Burning and stinging-type pain/eighth effecting the mid-thighs, right greater than the left.   PHYSICAL EXAMINATION: General appearance: alert, cooperative and no distress Head: Normocephalic, without obvious abnormality, atraumatic Neck: no adenopathy Lymph nodes: Cervical, supraclavicular, and axillary nodes normal. Resp: clear to auscultation bilaterally Cardio: regular rate and rhythm, S1, S2 normal, no murmur, click, rub or gallop GI: soft, non-tender; bowel sounds normal; no masses,  no  organomegaly Extremities: extremities normal, atraumatic, no cyanosis or edema Neurologic: Alert and oriented X 3, normal strength and tone. Normal symmetric reflexes. Normal coordination and gait  ECOG PERFORMANCE STATUS: 0 - Asymptomatic  Blood pressure 100/59, pulse 55, temperature 97.7 F (36.5 C), temperature source Oral, resp. rate 20, height 6\' 1"  (1.854 m), weight 170 lb 12.8 oz (77.474 kg).  LABORATORY DATA: Lab Results  Component Value Date   WBC 2.8* 09/02/2011   HGB 10.3* 09/02/2011   HCT 32.9* 09/02/2011   MCV 77.5* 09/02/2011   PLT 153  09/02/2011      Chemistry      Component Value Date/Time   NA 140 07/21/2011 0954   K 3.9 07/21/2011 0954   CL 101 07/21/2011 0954   CO2 26 07/21/2011 0954   BUN 9 07/21/2011 0954   CREATININE 0.81 07/21/2011 0954      Component Value Date/Time   CALCIUM 9.1 07/21/2011 0954   ALKPHOS 43 07/21/2011 0954   AST 14 07/21/2011 0954   ALT 18 07/21/2011 0954   BILITOT 1.9* 07/21/2011 0954       RADIOGRAPHIC STUDIES: No results found.  ASSESSMENT/PLAN: [name] with history of multiple myeloma currently on maintenance Revlimid as well as Zometa for bone disease. The patient is doing fine and his blood work showed no significant evidence for disease progression except for mildly increased free lambda light chain. The patient was discussed with Dr. Arbutus Ped. Should his back pain persist we will arrange to have plain x-rays taken of these area to look for myeloma bone changes. He is to followup with Dr. Artis Flock as scheduled as well as his primary care physician as scheduled. We will see him in one month for followup appointment with a repeat CBC differential, C. met, and LDH. He'll continue with his monthly Zometa infusions.  Eddie Thomas, Eddie Provencal E, PA-C   All questions were answered. The patient knows to call the clinic with any problems, questions or concerns. We can certainly see the patient much sooner if necessary.  I spent 20 minutes counseling the patient face to face. The total time spent in the appointment was 30 minutes.

## 2011-09-02 NOTE — Telephone Encounter (Signed)
Per POF I have scheduled appts. JMW  

## 2011-09-09 ENCOUNTER — Other Ambulatory Visit: Payer: Self-pay | Admitting: Medical Oncology

## 2011-09-09 DIAGNOSIS — C9000 Multiple myeloma not having achieved remission: Secondary | ICD-10-CM

## 2011-09-10 ENCOUNTER — Other Ambulatory Visit: Payer: Medicare Other | Admitting: Lab

## 2011-09-10 ENCOUNTER — Ambulatory Visit (HOSPITAL_BASED_OUTPATIENT_CLINIC_OR_DEPARTMENT_OTHER): Payer: Medicare Other

## 2011-09-10 VITALS — BP 116/63 | HR 49 | Temp 98.4°F | Resp 20

## 2011-09-10 DIAGNOSIS — C9000 Multiple myeloma not having achieved remission: Secondary | ICD-10-CM

## 2011-09-10 LAB — COMPREHENSIVE METABOLIC PANEL
ALT: 35 U/L (ref 0–53)
AST: 22 U/L (ref 0–37)
Creatinine, Ser: 0.81 mg/dL (ref 0.50–1.35)
Total Bilirubin: 1.8 mg/dL — ABNORMAL HIGH (ref 0.3–1.2)

## 2011-09-10 MED ORDER — SODIUM CHLORIDE 0.9 % IV SOLN
INTRAVENOUS | Status: AC
  Administered 2011-09-10: 14:00:00 via INTRAVENOUS

## 2011-09-10 MED ORDER — ZOLEDRONIC ACID 4 MG/100ML IV SOLN
4.0000 mg | Freq: Once | INTRAVENOUS | Status: AC
  Administered 2011-09-10: 4 mg via INTRAVENOUS
  Filled 2011-09-10: qty 100

## 2011-09-10 NOTE — Patient Instructions (Addendum)
Patient discharged home without complaints.

## 2011-09-15 ENCOUNTER — Other Ambulatory Visit: Payer: Self-pay | Admitting: *Deleted

## 2011-09-15 NOTE — Telephone Encounter (Signed)
THIS REFILL REQUEST FOR REVLIMID WAS GIVEN TO DR.MOHAMED'S NURSE, STEPHANIE JOHNSON,RN. 

## 2011-09-16 ENCOUNTER — Telehealth: Payer: Self-pay | Admitting: Medical Oncology

## 2011-09-16 DIAGNOSIS — C9000 Multiple myeloma not having achieved remission: Secondary | ICD-10-CM

## 2011-09-16 MED ORDER — LENALIDOMIDE 15 MG PO CAPS: 15.0000 mg | ORAL_CAPSULE | Freq: Every day | ORAL | Status: DC

## 2011-09-16 NOTE — Telephone Encounter (Signed)
revlimid refill faxed to [facility]

## 2011-10-02 ENCOUNTER — Other Ambulatory Visit: Payer: Medicare Other | Admitting: Lab

## 2011-10-02 ENCOUNTER — Telehealth: Payer: Self-pay | Admitting: *Deleted

## 2011-10-02 ENCOUNTER — Telehealth: Payer: Self-pay | Admitting: Internal Medicine

## 2011-10-02 ENCOUNTER — Ambulatory Visit (HOSPITAL_BASED_OUTPATIENT_CLINIC_OR_DEPARTMENT_OTHER): Payer: Medicare Other | Admitting: Physician Assistant

## 2011-10-02 ENCOUNTER — Encounter: Payer: Self-pay | Admitting: Physician Assistant

## 2011-10-02 VITALS — BP 138/66 | HR 54 | Temp 97.2°F | Resp 20 | Ht 73.0 in | Wt 173.1 lb

## 2011-10-02 DIAGNOSIS — C9000 Multiple myeloma not having achieved remission: Secondary | ICD-10-CM

## 2011-10-02 NOTE — Telephone Encounter (Signed)
Per staff message and POF I have scheduled appt.  JMW  

## 2011-10-02 NOTE — Telephone Encounter (Signed)
Gave pt appt calendar for October lab and ML, emailed [facility] regarding Zometa

## 2011-10-02 NOTE — Patient Instructions (Addendum)
Continue Your Revlimid and low dose Coumadin Continue monthly Zometa infusions Follow up in one month with repeat labs

## 2011-10-06 ENCOUNTER — Other Ambulatory Visit: Payer: Self-pay | Admitting: *Deleted

## 2011-10-06 ENCOUNTER — Other Ambulatory Visit: Payer: Self-pay | Admitting: Medical Oncology

## 2011-10-06 NOTE — Telephone Encounter (Signed)
Received refill request  but per [facility] it is too early to fill revlimid

## 2011-10-06 NOTE — Telephone Encounter (Signed)
THIS REFILL REQUEST FOR REVLIMID WAS PLACED ON DR.MOHAMED'S DESK. 

## 2011-10-08 ENCOUNTER — Ambulatory Visit (HOSPITAL_BASED_OUTPATIENT_CLINIC_OR_DEPARTMENT_OTHER): Payer: Medicare Other

## 2011-10-08 VITALS — BP 109/67 | HR 53 | Temp 98.0°F | Resp 18

## 2011-10-08 DIAGNOSIS — C9000 Multiple myeloma not having achieved remission: Secondary | ICD-10-CM

## 2011-10-08 MED ORDER — ZOLEDRONIC ACID 4 MG/5ML IV CONC
4.0000 mg | Freq: Once | INTRAVENOUS | Status: AC
  Administered 2011-10-08: 4 mg via INTRAVENOUS
  Filled 2011-10-08: qty 5

## 2011-10-09 NOTE — Progress Notes (Signed)
[facility] [phone]   Fax:([phone]  OFFICE PROGRESS NOTE  Eddie Lean, PA 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   PRIOR THERAPY: :  1. Status post 6 cycles of systemic chemotherapy with Revlimid and Decadron, last dose was given 07/21/2010 with very good response. 2. 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 Lance Bosch.  3. maintenance Revlimid at 10 mg by mouth daily status post 2 months. Therapy began 01/18/2011.  CURRENT THERAPY:  1. Zometa 4 mg IV every 4 weeks.  2. maintenance Revlimid at 15 mg by mouth daily  3. prophylactic dose Coumadin at 2 mg by mouth daily   INTERVAL HISTORY: Eddie Thomas. 66 y.o. male returns to the clinic today for routine followup visit. The patient is doing fine and tolerating his maintenance treatment with Revlimid and Zometa fairly well. He denied having any significant weight loss or night sweats. He was recently seen [facility] and taken off his prophylactic Valtrex. Was also given his immunizations with a schedule of when his next immunizations would be due which will be in November 2013 and again in January 2014. If possible Eddie Thomas would like to get his immunizations here at the [facility]. He had laboratory data drawn at [facility] his CBC differential and C. met were all within normal range and his protein studies are pending. He has some residual left shoulder discomfort but it is tolerable for now.   MEDICAL HISTORY: Past Medical History  Diagnosis Date  . Multiple myeloma   . Diabetes mellitus   . High cholesterol   . Diabetes mellitus 07/03/2011  . Hypertension 07/03/2011  . Hyperlipidemia 07/03/2011    ALLERGIES:   has no known allergies.  MEDICATIONS:  Current Outpatient Prescriptions  Medication Sig Dispense Refill  . ezetimibe  (ZETIA) 10 MG tablet Take 10 mg by mouth daily.        . Lancets (ACCU-CHEK MULTICLIX) lancets       . lenalidomide (REVLIMID) 15 MG capsule Take 1 capsule (15 mg total) by mouth daily. Do not break, chew or open capsules.  May cause dizziness.  28 capsule  0  . lisinopril (PRINIVIL,ZESTRIL) 10 MG tablet Take 10 mg by mouth daily.        . metFORMIN (GLUCOPHAGE) 500 MG tablet Take 500 mg by mouth daily with breakfast.       . simvastatin (ZOCOR) 10 MG tablet Take 10 mg by mouth at bedtime.        [name] warfarin (COUMADIN) 2 MG tablet Take 1 tablet (2 mg total) by mouth daily.  30 tablet  3   No current facility-administered medications for this visit.   Facility-Administered Medications Ordered in Other Visits  Medication Dose Route Frequency Provider Last Rate Last Dose  . zolendronic acid (ZOMETA) 4 mg in sodium chloride 0.9 % 100 mL IVPB  4 mg Intravenous Once Conni Slipper, PA   4 mg at 10/08/11 1418    SURGICAL HISTORY:  Past Surgical History  Procedure Date  . Limbal stem cell transplant     REVIEW OF SYSTEMS:  A comprehensive review of systems was negative except for: Musculoskeletal: positive for Left shoulder pain   PHYSICAL EXAMINATION: General appearance: alert, cooperative and no distress Head: Normocephalic, without obvious abnormality, atraumatic Neck: no adenopathy Lymph nodes: Cervical, supraclavicular,  and axillary nodes normal. Resp: clear to auscultation bilaterally Cardio: regular rate and rhythm, S1, S2 normal, no murmur, click, rub or gallop GI: soft, non-tender; bowel sounds normal; no masses,  no organomegaly Extremities: extremities normal, atraumatic, no cyanosis or edema Neurologic: Alert and oriented X 3, normal strength and tone. Normal symmetric reflexes. Normal coordination and gait  ECOG PERFORMANCE STATUS: 0 - Asymptomatic  Blood pressure 138/66, pulse 54, temperature 97.2 F (36.2 C), resp. rate 20, height 6\' 1"  (1.854 m), weight 173 lb 1.6 oz  (78.518 kg).  LABORATORY DATA: Lab Results  Component Value Date   WBC 2.8* 09/02/2011   HGB 10.3* 09/02/2011   HCT 32.9* 09/02/2011   MCV 77.5* 09/02/2011   PLT 153 09/02/2011      Chemistry      Component Value Date/Time   NA 137 09/10/2011 1232   K 3.4* 09/10/2011 1232   CL 102 09/10/2011 1232   CO2 26 09/10/2011 1232   BUN 7 09/10/2011 1232   CREATININE 0.81 09/10/2011 1232      Component Value Date/Time   CALCIUM 9.0 09/10/2011 1232   ALKPHOS 46 09/10/2011 1232   AST 22 09/10/2011 1232   ALT 35 09/10/2011 1232   BILITOT 1.8* 09/10/2011 1232       RADIOGRAPHIC STUDIES: No results found.  ASSESSMENT/PLAN: [name] with history of multiple myeloma currently on maintenance Revlimid as well as Zometa for bone disease. The patient is doing fine and his last blood work from our office showed no significant evidence for disease progression except for mildly increased free lambda light chain. The protein studies drawn at [facility] or still pending. The CBC differential and C. met results from [facility] have been scanned into our system. The patient was discussed with Dr. Arbutus Ped. Should his left shoulder pain become more persistent or significant we will refer him to radiation oncology for further evaluation and management. I have spoken with our pharmacist here at cancer Center we will arrange to give Eddie Thomas his upcoming immunizations here at [facility].  We will see him in one month for followup appointment with a repeat CBC differential, C. met, and LDH. He'll continue with his monthly Zometa infusions.  Eddie Thomas, Eddie Sevillano E, PA-C   All questions were answered. The patient knows to call the clinic with any problems, questions or concerns. We can certainly see the patient much sooner if necessary.  I spent 20 minutes counseling the patient face to face. The total time spent in the appointment was 30 minutes.

## 2011-10-13 ENCOUNTER — Telehealth: Payer: Self-pay | Admitting: Internal Medicine

## 2011-10-13 NOTE — Telephone Encounter (Signed)
Talked to patient gave him appt for October 2013

## 2011-10-13 NOTE — Telephone Encounter (Signed)
Pt wants to know if he is supposed to have Zometa less than 1 month, referred question to nurse, left message on VM

## 2011-10-14 ENCOUNTER — Other Ambulatory Visit: Payer: Self-pay | Admitting: Medical Oncology

## 2011-10-14 DIAGNOSIS — C9000 Multiple myeloma not having achieved remission: Secondary | ICD-10-CM

## 2011-10-14 MED ORDER — LENALIDOMIDE 15 MG PO CAPS: 15.0000 mg | ORAL_CAPSULE | Freq: Every day | ORAL | Status: DC

## 2011-10-14 NOTE — Telephone Encounter (Signed)
rx faxed to [facility].

## 2011-10-16 ENCOUNTER — Telehealth: Payer: Self-pay | Admitting: *Deleted

## 2011-10-16 NOTE — Telephone Encounter (Signed)
Per staff message and POF I have scheduled appts.  JMW  

## 2011-10-17 ENCOUNTER — Other Ambulatory Visit: Payer: Self-pay | Admitting: Physician Assistant

## 2011-10-22 ENCOUNTER — Telehealth: Payer: Self-pay | Admitting: Internal Medicine

## 2011-10-22 NOTE — Telephone Encounter (Signed)
Per AJ appts correct as scheduled. S/w pt today confirming next appts for 10/10 and 10/17.

## 2011-10-30 ENCOUNTER — Other Ambulatory Visit (HOSPITAL_BASED_OUTPATIENT_CLINIC_OR_DEPARTMENT_OTHER): Payer: Medicare Other | Admitting: Lab

## 2011-10-30 ENCOUNTER — Telehealth: Payer: Self-pay | Admitting: *Deleted

## 2011-10-30 ENCOUNTER — Ambulatory Visit (HOSPITAL_BASED_OUTPATIENT_CLINIC_OR_DEPARTMENT_OTHER): Payer: Medicare Other

## 2011-10-30 ENCOUNTER — Encounter: Payer: Self-pay | Admitting: Physician Assistant

## 2011-10-30 ENCOUNTER — Ambulatory Visit: Payer: Medicare Other

## 2011-10-30 ENCOUNTER — Telehealth: Payer: Self-pay | Admitting: Internal Medicine

## 2011-10-30 ENCOUNTER — Ambulatory Visit (HOSPITAL_BASED_OUTPATIENT_CLINIC_OR_DEPARTMENT_OTHER): Payer: Medicare Other | Admitting: Physician Assistant

## 2011-10-30 ENCOUNTER — Ambulatory Visit (HOSPITAL_BASED_OUTPATIENT_CLINIC_OR_DEPARTMENT_OTHER): Payer: Medicare Other | Admitting: Lab

## 2011-10-30 VITALS — BP 126/58 | HR 54 | Temp 98.0°F | Resp 20 | Ht 73.0 in | Wt 170.8 lb

## 2011-10-30 DIAGNOSIS — C9000 Multiple myeloma not having achieved remission: Secondary | ICD-10-CM

## 2011-10-30 DIAGNOSIS — Z23 Encounter for immunization: Secondary | ICD-10-CM

## 2011-10-30 LAB — CBC WITH DIFFERENTIAL/PLATELET
EOS%: 5.6 % (ref 0.0–7.0)
Eosinophils Absolute: 0.2 10*3/uL (ref 0.0–0.5)
LYMPH%: 29.3 % (ref 14.0–49.0)
MCH: 24 pg — ABNORMAL LOW (ref 27.2–33.4)
MCV: 76.2 fL — ABNORMAL LOW (ref 79.3–98.0)
MONO%: 10.7 % (ref 0.0–14.0)
NEUT#: 1.8 10*3/uL (ref 1.5–6.5)
Platelets: 166 10*3/uL (ref 140–400)
RBC: 4.37 10*6/uL (ref 4.20–5.82)
RDW: 14.8 % — ABNORMAL HIGH (ref 11.0–14.6)
nRBC: 0 % (ref 0–0)

## 2011-10-30 LAB — LACTATE DEHYDROGENASE (CC13): LDH: 149 U/L (ref 125–220)

## 2011-10-30 LAB — COMPREHENSIVE METABOLIC PANEL (CC13)
ALT: 23 U/L (ref 0–55)
Alkaline Phosphatase: 59 U/L (ref 40–150)
CO2: 24 mEq/L (ref 22–29)
Creatinine: 0.9 mg/dL (ref 0.7–1.3)
Glucose: 193 mg/dl — ABNORMAL HIGH (ref 70–99)
Total Bilirubin: 2.1 mg/dL — ABNORMAL HIGH (ref 0.20–1.20)

## 2011-10-30 MED ORDER — INFLUENZA VIRUS VACC SPLIT PF IM SUSP
0.5000 mL | Freq: Once | INTRAMUSCULAR | Status: AC
  Administered 2011-10-30: 0.5 mL via INTRAMUSCULAR
  Filled 2011-10-30: qty 0.5

## 2011-10-30 NOTE — Telephone Encounter (Signed)
Per staff message and POF I have scheduled appts.  JMW  

## 2011-10-30 NOTE — Patient Instructions (Addendum)
We will repeat your protein studies today Continue on your maintenance Revlimid Follow up with Dr. Arbutus Ped in 2 weeks to discuss the results of your protein studies

## 2011-10-30 NOTE — Telephone Encounter (Signed)
appts made and printed for pt aom °

## 2011-10-31 ENCOUNTER — Telehealth: Payer: Self-pay | Admitting: *Deleted

## 2011-10-31 ENCOUNTER — Telehealth: Payer: Self-pay | Admitting: Internal Medicine

## 2011-10-31 NOTE — Telephone Encounter (Signed)
Per Tiana Loft, Alda Lea (assistant to Dr Lance Bosch) was contacted to have most recent protein studies faxed over from [facility].  Ph# [phone].  Left msg requesting studies.  SLJ

## 2011-10-31 NOTE — Telephone Encounter (Signed)
called pt with 10/21 lab and md    aom

## 2011-11-03 ENCOUNTER — Encounter: Payer: Self-pay | Admitting: Specialist

## 2011-11-03 ENCOUNTER — Telehealth: Payer: Self-pay | Admitting: *Deleted

## 2011-11-03 NOTE — Progress Notes (Signed)
I spoke briefly on the phone with Eddie Thomas, who is a regular attendee at the [facility].  He had expressed concern when we last met regarding up-coming test results.  He explained to me that he does not yet have all the results but appreciated my checking on him.

## 2011-11-03 NOTE — Telephone Encounter (Signed)
labwork from [facility] reviewed by Tiana Loft, PA-C.  Will use to compare to pr studies done on 10/30/11.  SLJ

## 2011-11-04 LAB — SPEP & IFE WITH QIG
Albumin ELP: 56.5 % (ref 55.8–66.1)
Beta Globulin: 5.5 % (ref 4.7–7.2)
IgA: 915 mg/dL — ABNORMAL HIGH (ref 68–379)
Total Protein, Serum Electrophoresis: 6.7 g/dL (ref 6.0–8.3)

## 2011-11-04 LAB — KAPPA/LAMBDA LIGHT CHAINS: Kappa free light chain: 0.83 mg/dL (ref 0.33–1.94)

## 2011-11-06 ENCOUNTER — Ambulatory Visit (HOSPITAL_BASED_OUTPATIENT_CLINIC_OR_DEPARTMENT_OTHER): Payer: Medicare Other

## 2011-11-06 VITALS — BP 123/53 | HR 51 | Temp 97.7°F

## 2011-11-06 DIAGNOSIS — C9000 Multiple myeloma not having achieved remission: Secondary | ICD-10-CM

## 2011-11-06 MED ORDER — SODIUM CHLORIDE 0.9 % IJ SOLN
10.0000 mL | INTRAMUSCULAR | Status: DC | PRN
  Filled 2011-11-06: qty 10

## 2011-11-06 MED ORDER — ZOLEDRONIC ACID 4 MG/5ML IV CONC
4.0000 mg | Freq: Once | INTRAVENOUS | Status: AC
  Administered 2011-11-06: 4 mg via INTRAVENOUS
  Filled 2011-11-06: qty 5

## 2011-11-06 NOTE — Progress Notes (Signed)
[facility] Telephone:([phone]) [phone]   Fax:([phone]  OFFICE PROGRESS NOTE  [name], PA [address] 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   PRIOR THERAPY: :  1. Status post 6 cycles of systemic chemotherapy with Revlimid and Decadron, last dose was given 07/21/2010 with very good response. 2. 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 Lance Bosch.  3. maintenance Revlimid at 10 mg by mouth daily status post 2 months. Therapy began 01/18/2011.  CURRENT THERAPY:  1. Zometa 4 mg IV every 4 weeks.  2. maintenance Revlimid at 15 mg by mouth daily  3. prophylactic dose Coumadin at 2 mg by mouth daily   INTERVAL HISTORY: Eddie Thomas. 66 y.o. male returns to the clinic today for routine followup visit. The patient is doing fine and tolerating his maintenance treatment with Revlimid and Zometa fairly well. He denied having any significant weight loss or night sweats.He states that he was told by Dr. Lance Bosch that his protein studies were elevated. We do not have a copy of those results available for review. He had a "knot" develop on his right bicep area but it resolved. He voiced no other complaints. He will be due for his next set of immunizations in November.    MEDICAL HISTORY: Past Medical History  Diagnosis Date  . Multiple myeloma(203.0)   . Diabetes mellitus   . High cholesterol   . Diabetes mellitus 07/03/2011  . Hypertension 07/03/2011  . Hyperlipidemia 07/03/2011    ALLERGIES:   has no known allergies.  MEDICATIONS:  Current Outpatient Prescriptions  Medication Sig Dispense Refill  . ezetimibe (ZETIA) 10 MG tablet Take 10 mg by mouth daily.        . Lancets (ACCU-CHEK MULTICLIX) lancets       . lenalidomide (REVLIMID) 15 MG capsule Take 1 capsule (15 mg total) by mouth daily. Do not break, chew or open capsules.   May cause dizziness.  28 capsule  0  . lisinopril (PRINIVIL,ZESTRIL) 10 MG tablet Take 10 mg by mouth daily.        . metFORMIN (GLUCOPHAGE) 500 MG tablet Take 500 mg by mouth daily with breakfast.       . simvastatin (ZOCOR) 10 MG tablet Take 10 mg by mouth at bedtime.        [name] warfarin (COUMADIN) 2 MG tablet Take 1 tablet (2 mg total) by mouth daily.  30 tablet  3   No current facility-administered medications for this visit.   Facility-Administered Medications Ordered in Other Visits  Medication Dose Route Frequency Provider Last Rate Last Dose  . sodium chloride 0.9 % injection 10 mL  10 mL Intracatheter PRN Si Gaul, MD      . zolendronic acid (ZOMETA) 4 mg in sodium chloride 0.9 % 100 mL IVPB  4 mg Intravenous Once Conni Slipper, PA   4 mg at 11/06/11 1023    SURGICAL HISTORY:  Past Surgical History  Procedure Date  . Limbal stem cell transplant     REVIEW OF SYSTEMS:  Pertinent items are noted in HPI.   PHYSICAL EXAMINATION: General appearance: alert, cooperative and no distress Head: Normocephalic, without obvious abnormality, atraumatic Neck: no adenopathy Lymph nodes: Cervical, supraclavicular, and axillary nodes normal. Resp: clear to auscultation bilaterally Cardio: regular rate and rhythm, S1, S2 normal, no murmur, click, rub or gallop  GI: soft, non-tender; bowel sounds normal; no masses,  no organomegaly Extremities: extremities normal, atraumatic, no cyanosis or edema Neurologic: Alert and oriented X 3, normal strength and tone. Normal symmetric reflexes. Normal coordination and gait  ECOG PERFORMANCE STATUS: 0 - Asymptomatic  Blood pressure 126/58, pulse 54, temperature 98 F (36.7 C), temperature source Oral, resp. rate 20, height 6\' 1"  (1.854 m), weight 170 lb 12.8 oz (77.474 kg).  LABORATORY DATA: Lab Results  Component Value Date   WBC 3.4* 10/30/2011   HGB 10.5* 10/30/2011   HCT 33.3* 10/30/2011   MCV 76.2* 10/30/2011   PLT 166 10/30/2011       Chemistry      Component Value Date/Time   NA 139 10/30/2011 1142   NA 137 09/10/2011 1232   K 3.7 10/30/2011 1142   K 3.4* 09/10/2011 1232   CL 104 10/30/2011 1142   CL 102 09/10/2011 1232   CO2 24 10/30/2011 1142   CO2 26 09/10/2011 1232   BUN 7.0 10/30/2011 1142   BUN 7 09/10/2011 1232   CREATININE 0.9 10/30/2011 1142   CREATININE 0.81 09/10/2011 1232      Component Value Date/Time   CALCIUM 9.1 10/30/2011 1142   CALCIUM 9.0 09/10/2011 1232   ALKPHOS 59 10/30/2011 1142   ALKPHOS 46 09/10/2011 1232   AST 17 10/30/2011 1142   AST 22 09/10/2011 1232   ALT 23 10/30/2011 1142   ALT 35 09/10/2011 1232   BILITOT 2.10* 10/30/2011 1142   BILITOT 1.8* 09/10/2011 1232       RADIOGRAPHIC STUDIES: No results found.  ASSESSMENT/PLAN: [name] with history of multiple myeloma currently on maintenance Revlimid as well as Zometa for bone disease. The patient is doing fine and his last blood work from our office showed no significant evidence for disease progression except for mildly increased free lambda light chain. The protein studies drawn at [facility] are still pending. The patient was discussed with Dr. Arbutus Ped. We will repeat his protein studies today and obtain the protein study results from [facility] and compare the two. He will follow up with Dr. Arbutus Ped in 2 weeks to discuss the results of the protein studies and discuss his treatment options. He'll continue with his monthly Zometa infusions.  Laural Benes, Ynez Eugenio E, PA-C   All questions were answered. The patient knows to call the clinic with any problems, questions or concerns. We can certainly see the patient much sooner if necessary.  I spent 20 minutes counseling the patient face to face. The total time spent in the appointment was 30 minutes.

## 2011-11-10 ENCOUNTER — Other Ambulatory Visit: Payer: Medicare Other

## 2011-11-10 ENCOUNTER — Telehealth: Payer: Self-pay | Admitting: *Deleted

## 2011-11-10 ENCOUNTER — Other Ambulatory Visit: Payer: Self-pay | Admitting: *Deleted

## 2011-11-10 ENCOUNTER — Ambulatory Visit (HOSPITAL_BASED_OUTPATIENT_CLINIC_OR_DEPARTMENT_OTHER): Payer: Medicare Other | Admitting: Internal Medicine

## 2011-11-10 ENCOUNTER — Telehealth: Payer: Self-pay | Admitting: Internal Medicine

## 2011-11-10 VITALS — BP 130/73 | HR 50 | Temp 97.1°F | Ht 73.0 in | Wt 171.6 lb

## 2011-11-10 DIAGNOSIS — C9000 Multiple myeloma not having achieved remission: Secondary | ICD-10-CM

## 2011-11-10 LAB — CBC WITH DIFFERENTIAL/PLATELET
Basophils Absolute: 0 10*3/uL (ref 0.0–0.1)
Eosinophils Absolute: 0.2 10*3/uL (ref 0.0–0.5)
HCT: 33 % — ABNORMAL LOW (ref 38.4–49.9)
LYMPH%: 20.5 % (ref 14.0–49.0)
MCV: 76.5 fL — ABNORMAL LOW (ref 79.3–98.0)
MONO#: 0.5 10*3/uL (ref 0.1–0.9)
MONO%: 11.8 % (ref 0.0–14.0)
NEUT#: 2.4 10*3/uL (ref 1.5–6.5)
NEUT%: 61.8 % (ref 39.0–75.0)
Platelets: 153 10*3/uL (ref 140–400)
WBC: 3.8 10*3/uL — ABNORMAL LOW (ref 4.0–10.3)

## 2011-11-10 LAB — COMPREHENSIVE METABOLIC PANEL (CC13)
Alkaline Phosphatase: 54 U/L (ref 40–150)
BUN: 7 mg/dL (ref 7.0–26.0)
CO2: 24 mEq/L (ref 22–29)
Creatinine: 1 mg/dL (ref 0.7–1.3)
Glucose: 190 mg/dl — ABNORMAL HIGH (ref 70–99)
Total Bilirubin: 2 mg/dL — ABNORMAL HIGH (ref 0.20–1.20)
Total Protein: 6.9 g/dL (ref 6.4–8.3)

## 2011-11-10 LAB — LACTATE DEHYDROGENASE (CC13): LDH: 175 U/L (ref 125–220)

## 2011-11-10 MED ORDER — DEXAMETHASONE 4 MG PO TABS: ORAL_TABLET | ORAL | Status: DC

## 2011-11-10 NOTE — Telephone Encounter (Signed)
Per staff phone call and POF I have scheduled appts. JMW  

## 2011-11-10 NOTE — Progress Notes (Signed)
Called and d/c'd pt's revlimid rx's through [facility].

## 2011-11-10 NOTE — Patient Instructions (Signed)
Your myeloma panel showed evidence for disease progression. We will discontinue treatment with Revlimid and Coumadin. We discussed other treatment options including treatment with Velcade, Doxil and Decadron, first cycle next week.

## 2011-11-10 NOTE — Progress Notes (Signed)
[facility] Telephone:(336) [phone]   Fax:(336) [phone]  OFFICE PROGRESS NOTE  Eddie Lean, PA [address] 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   PRIOR THERAPY: :  1. Status post 6 cycles of systemic chemotherapy with Revlimid and Decadron, last dose was given 07/21/2010 with very good response. 2. 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 Lance Bosch.  3. maintenance Revlimid at 10 mg by mouth daily status post 2 months. Therapy began 01/18/2011.  CURRENT THERAPY:  1. Zometa 4 mg IV every 4 weeks.  2. maintenance Revlimid at 15 mg by mouth daily, discontinued today secondary to disease progression.  3. prophylactic dose Coumadin at 2 mg by mouth daily    INTERVAL HISTORY: Eddie Thomas. 66 y.o. male returns to the clinic today for routine followup visit. The patient is feeling fine today with no specific complaints. He denied having any significant weight loss or night sweats. He has no bleeding issues. No palpable lymphadenopathy. He denied having any significant chest pain, shortness breath, cough or hemoptysis. The patient is tolerating his treatment with maintenance Revlimid fairly well with no significant adverse effects. He has repeat CBC, comprehensive metabolic panel, LDH and myeloma panel performed recently and he is here for evaluation and discussion of his lab results.  MEDICAL HISTORY: Past Medical History  Diagnosis Date  . Multiple myeloma(203.0)   . Diabetes mellitus   . High cholesterol   . Diabetes mellitus 07/03/2011  . Hypertension 07/03/2011  . Hyperlipidemia 07/03/2011    ALLERGIES:   has no known allergies.  MEDICATIONS:  Current Outpatient Prescriptions  Medication Sig Dispense Refill  . ezetimibe (ZETIA) 10 MG tablet Take 10 mg by mouth daily.        . Lancets (ACCU-CHEK MULTICLIX) lancets        . lenalidomide (REVLIMID) 15 MG capsule Take 1 capsule (15 mg total) by mouth daily. Do not break, chew or open capsules.  May cause dizziness.  28 capsule  0  . lisinopril (PRINIVIL,ZESTRIL) 10 MG tablet Take 10 mg by mouth daily.        . metFORMIN (GLUCOPHAGE) 500 MG tablet Take 500 mg by mouth daily with breakfast.       . simvastatin (ZOCOR) 10 MG tablet Take 10 mg by mouth at bedtime.        [name] warfarin (COUMADIN) 2 MG tablet Take 1 tablet (2 mg total) by mouth daily.  30 tablet  3    SURGICAL HISTORY:  Past Surgical History  Procedure Date  . Limbal stem cell transplant     REVIEW OF SYSTEMS:  A comprehensive review of systems was negative.   PHYSICAL EXAMINATION: General appearance: alert, cooperative and no distress Head: Normocephalic, without obvious abnormality, atraumatic Neck: no adenopathy Lymph nodes: Cervical, supraclavicular, and axillary nodes normal. Resp: clear to auscultation bilaterally Cardio: regular rate and rhythm, S1, S2 normal, no murmur, click, rub or gallop GI: soft, non-tender; bowel sounds normal; no masses,  no organomegaly Extremities: extremities normal, atraumatic, no cyanosis or edema Neurologic: Alert and oriented X 3, normal strength and tone. Normal symmetric reflexes. Normal coordination and gait  ECOG PERFORMANCE STATUS: 0 - Asymptomatic  Blood pressure 130/73, pulse 50, temperature 97.1 F (36.2 C), temperature source Oral, height 6\' 1"  (1.854 m), weight 171 lb 9.6 oz (77.837 kg).  LABORATORY DATA: Lab Results  Component Value Date   WBC 3.8* 11/10/2011   HGB 10.5* 11/10/2011   HCT 33.0* 11/10/2011   MCV 76.5* 11/10/2011   PLT 153 11/10/2011      Chemistry      Component Value Date/Time   NA 139 10/30/2011 1142   NA 137 09/10/2011 1232   K 3.7 10/30/2011 1142   K 3.4* 09/10/2011 1232   CL 104 10/30/2011 1142   CL 102 09/10/2011 1232   CO2 24 10/30/2011 1142   CO2 26 09/10/2011 1232   BUN 7.0 10/30/2011 1142   BUN 7  09/10/2011 1232   CREATININE 0.9 10/30/2011 1142   CREATININE 0.81 09/10/2011 1232      Component Value Date/Time   CALCIUM 9.1 10/30/2011 1142   CALCIUM 9.0 09/10/2011 1232   ALKPHOS 59 10/30/2011 1142   ALKPHOS 46 09/10/2011 1232   AST 17 10/30/2011 1142   AST 22 09/10/2011 1232   ALT 23 10/30/2011 1142   ALT 35 09/10/2011 1232   BILITOT 2.10* 10/30/2011 1142   BILITOT 1.8* 09/10/2011 1232       RADIOGRAPHIC STUDIES: No results found.  ASSESSMENT: This is a very pleasant 66 years old African American male with history of multiple myeloma, IgA subtype diagnosed in December of 2011 status post systemic chemotherapy with Revlimid and Decadron followed by peripheral blood autologous stem cell transplant and has been on maintenance Revlimid since December of 2012. Unfortunately the patient has evidence for disease progression with worsening myeloma panel.  PLAN: I discussed the lab result with the patient today. I recommended for him to discontinue the treatment with maintenance Revlimid at this point. I would consider the patient for treatment with Velcade 1.3 mg/M2 on days 1, 4, 8 and 11, Doxil 30 mg/M2 on day 4 and Decadron 40 mg on a weekly basis, every 3 weeks. I discussed with the patient adverse effect of this treatment including but not limited to nausea, vomiting, fatigue, peripheral neuropathy as well as cardiac dysfunction from Doxil. I will arrange for the patient to have 2-D echo performed this week before starting the first cycle of his treatment. He would like to proceed with treatment as planned. He is expected to start the first cycle of this treatment next week. He would come back for followup visit in 4 weeks with the start of cycle #2. The patient was advised to call immediately if he has any concerning symptoms in the interval.  All questions were answered. The patient knows to call the clinic with any problems, questions or concerns. We can certainly see the patient much  sooner if necessary.  I spent 15 minutes counseling the patient face to face. The total time spent in the appointment was 25 minutes.

## 2011-11-10 NOTE — Telephone Encounter (Signed)
APPTS MADE AND PRINTED FOR PT PT AWARE THAT I WILL CALL WITH ECHO APPT,PRINTED FOR LINDA TO PRECERT      AOM

## 2011-11-11 ENCOUNTER — Other Ambulatory Visit: Payer: Self-pay

## 2011-11-11 ENCOUNTER — Telehealth: Payer: Self-pay | Admitting: Internal Medicine

## 2011-11-11 MED ORDER — DEXAMETHASONE 4 MG PO TABS: 4.0000 mg | ORAL_TABLET | ORAL | Status: DC

## 2011-11-11 NOTE — Telephone Encounter (Signed)
called pt with echo appt    aom

## 2011-11-13 ENCOUNTER — Telehealth: Payer: Self-pay | Admitting: Medical Oncology

## 2011-11-13 NOTE — Telephone Encounter (Signed)
Pt notified that rx was called to a pharmacist

## 2011-11-14 ENCOUNTER — Ambulatory Visit (HOSPITAL_COMMUNITY)
Admission: RE | Admit: 2011-11-14 | Discharge: 2011-11-14 | Disposition: A | Discharge status: Home / Self Care | Payer: Medicare Other | Source: Ambulatory Visit | Attending: Internal Medicine | Admitting: Internal Medicine

## 2011-11-14 DIAGNOSIS — E119 Type 2 diabetes mellitus without complications: Secondary | ICD-10-CM | POA: Insufficient documentation

## 2011-11-14 DIAGNOSIS — I059 Rheumatic mitral valve disease, unspecified: Secondary | ICD-10-CM

## 2011-11-14 DIAGNOSIS — Z01818 Encounter for other preprocedural examination: Secondary | ICD-10-CM | POA: Insufficient documentation

## 2011-11-14 DIAGNOSIS — C9000 Multiple myeloma not having achieved remission: Secondary | ICD-10-CM | POA: Insufficient documentation

## 2011-11-14 DIAGNOSIS — I10 Essential (primary) hypertension: Secondary | ICD-10-CM | POA: Insufficient documentation

## 2011-11-17 ENCOUNTER — Other Ambulatory Visit: Payer: Self-pay | Admitting: Medical Oncology

## 2011-11-17 ENCOUNTER — Other Ambulatory Visit (HOSPITAL_BASED_OUTPATIENT_CLINIC_OR_DEPARTMENT_OTHER): Payer: Medicare Other | Admitting: Lab

## 2011-11-17 ENCOUNTER — Ambulatory Visit (HOSPITAL_BASED_OUTPATIENT_CLINIC_OR_DEPARTMENT_OTHER): Payer: Medicare Other

## 2011-11-17 VITALS — BP 136/76 | HR 48 | Temp 97.9°F | Resp 17

## 2011-11-17 DIAGNOSIS — C9000 Multiple myeloma not having achieved remission: Secondary | ICD-10-CM

## 2011-11-17 DIAGNOSIS — T451X5A Adverse effect of antineoplastic and immunosuppressive drugs, initial encounter: Secondary | ICD-10-CM

## 2011-11-17 DIAGNOSIS — Z5112 Encounter for antineoplastic immunotherapy: Secondary | ICD-10-CM

## 2011-11-17 DIAGNOSIS — R112 Nausea with vomiting, unspecified: Secondary | ICD-10-CM

## 2011-11-17 LAB — COMPREHENSIVE METABOLIC PANEL (CC13)
ALT: 19 U/L (ref 0–55)
AST: 22 U/L (ref 5–34)
CO2: 21 mEq/L — ABNORMAL LOW (ref 22–29)
Sodium: 138 mEq/L (ref 136–145)
Total Bilirubin: 1.7 mg/dL — ABNORMAL HIGH (ref 0.20–1.20)
Total Protein: 6.9 g/dL (ref 6.4–8.3)

## 2011-11-17 LAB — CBC WITH DIFFERENTIAL/PLATELET
BASO%: 2.3 % — ABNORMAL HIGH (ref 0.0–2.0)
EOS%: 2.5 % (ref 0.0–7.0)
LYMPH%: 27.5 % (ref 14.0–49.0)
MCH: 24.9 pg — ABNORMAL LOW (ref 27.2–33.4)
MCHC: 32.4 g/dL (ref 32.0–36.0)
MONO#: 0.4 10*3/uL (ref 0.1–0.9)
RBC: 3.94 10*6/uL — ABNORMAL LOW (ref 4.20–5.82)
WBC: 3.4 10*3/uL — ABNORMAL LOW (ref 4.0–10.3)
lymph#: 0.9 10*3/uL (ref 0.9–3.3)

## 2011-11-17 LAB — LACTATE DEHYDROGENASE (CC13): LDH: 227 U/L — ABNORMAL HIGH (ref 125–220)

## 2011-11-17 MED ORDER — VALACYCLOVIR HCL 500 MG PO TABS: 500.0000 mg | ORAL_TABLET | Freq: Every day | ORAL | Status: DC

## 2011-11-17 MED ORDER — SODIUM CHLORIDE 0.9 % IV SOLN
Freq: Once | INTRAVENOUS | Status: AC
  Administered 2011-11-17: 09:00:00 via INTRAVENOUS

## 2011-11-17 MED ORDER — PROCHLORPERAZINE MALEATE 10 MG PO TABS: 10.0000 mg | ORAL_TABLET | Freq: Four times a day (QID) | ORAL | Status: DC | PRN

## 2011-11-17 MED ORDER — BORTEZOMIB CHEMO IV INJECTION 3.5 MG
0.7000 mg/m2 | Freq: Once | INTRAMUSCULAR | Status: AC
  Administered 2011-11-17: 1.4 mg via INTRAVENOUS
  Filled 2011-11-17: qty 1.4

## 2011-11-17 MED ORDER — ONDANSETRON 8 MG/50ML IVPB (CHCC)
8.0000 mg | Freq: Once | INTRAVENOUS | Status: AC
  Administered 2011-11-17: 8 mg via INTRAVENOUS

## 2011-11-17 NOTE — Patient Instructions (Signed)
[facility] Discharge Instructions for Patients Receiving Chemotherapy  Today you received the following chemotherapy agent Velcade.  To help prevent nausea and vomiting after your treatment, we encourage you to take your nausea medication  Begin taking your nausea medication as often as prescribed for by Dr. Arbutus Ped. .   If you develop nausea and vomiting that is not controlled by your nausea medication, call the clinic. If it is after clinic hours your family physician or the after hours number for the clinic or go to the Emergency Department.   BELOW ARE SYMPTOMS THAT SHOULD BE REPORTED IMMEDIATELY:  *FEVER GREATER THAN 100.5 F  *CHILLS WITH OR WITHOUT FEVER  NAUSEA AND VOMITING THAT IS NOT CONTROLLED WITH YOUR NAUSEA MEDICATION  *UNUSUAL SHORTNESS OF BREATH  *UNUSUAL BRUISING OR BLEEDING  TENDERNESS IN MOUTH AND THROAT WITH OR WITHOUT PRESENCE OF ULCERS  *URINARY PROBLEMS  *BOWEL PROBLEMS  UNUSUAL RASH Items with * indicate a potential emergency and should be followed up as soon as possible.  One of the nurses will contact you 24 hours after your treatment. Please let the nurse know about any problems that you may have experienced. Feel free to call the clinic you have any questions or concerns. The clinic phone number is [phone].   I have been informed and understand all the instructions given to me. I know to contact the clinic, my physician, or go to the Emergency Department if any problems should occur. I do not have any questions at this time, but understand that I may call the clinic during office hours   should I have any questions or need assistance in obtaining follow up care.    __________________________________________  _____________  __________ Signature of Patient or Authorized Representative            Date                   Time    __________________________________________ Nurse's Signature   BORTEZOMIB (bor TEZ oh mib) is a  chemotherapy drug. It slows the growth of cancer cells. This medicine is used to treat multiple myeloma, lymphoma, and other cancers. This medicine may be used for other purposes; ask your health care provider or pharmacist if you have questions. What should I tell my health care provider before I take this medicine? They need to know if you have any of these conditions: -heart disease -irregular heartbeat -liver disease -low blood counts, like low white blood cells, platelets, or hemoglobin -peripheral neuropathy -taking medicine for blood pressure -an unusual or allergic reaction to bortezomib, mannitol, boron, other medicines, foods, dyes, or preservatives -pregnant or trying to get pregnant -breast-feeding How should I use this medicine? This medicine is for injection into a vein or for injection under the skin. It is given by a health care professional in a hospital or clinic setting. Talk to your pediatrician regarding the use of this medicine in children. Special care may be needed. Overdosage: If you think you have taken too much of this medicine contact a poison control center or emergency room at once. NOTE: This medicine is only for you. Do not share this medicine with others. What if I miss a dose? It is important not to miss your dose. Call your doctor or health care professional if you are unable to keep an appointment. What may interact with this medicine? -medicines for diabetes -medicines to increase blood counts like filgrastim, pegfilgrastim, sargramostim -zalcitabine Talk to your doctor or health  care professional before taking any of these medicines: -acetaminophen -aspirin -ibuprofen -ketoprofen -naproxen This list may not describe all possible interactions. Give your health care provider a list of all the medicines, herbs, non-prescription drugs, or dietary supplements you use. Also tell them if you smoke, drink alcohol, or use illegal drugs. Some items may  interact with your medicine. What should I watch for while using this medicine? Visit your doctor for checks on your progress. This drug may make you feel generally unwell. This is not uncommon, as chemotherapy can affect healthy cells as well as cancer cells. Report any side effects. Continue your course of treatment even though you feel ill unless your doctor tells you to stop. You may get drowsy or dizzy. Do not drive, use machinery, or do anything that needs mental alertness until you know how this medicine affects you. Do not stand or sit up quickly, especially if you are an older patient. This reduces the risk of dizzy or fainting spells. In some cases, you may be given additional medicines to help with side effects. Follow all directions for their use. Call your doctor or health care professional for advice if you get a fever, chills or sore throat, or other symptoms of a cold or flu. Do not treat yourself. This drug decreases your body's ability to fight infections. Try to avoid being around people who are sick. This medicine may increase your risk to bruise or bleed. Call your doctor or health care professional if you notice any unusual bleeding. Be careful brushing and flossing your teeth or using a toothpick because you may get an infection or bleed more easily. If you have any dental work done, tell your dentist you are receiving this medicine. Avoid taking products that contain aspirin, acetaminophen, ibuprofen, naproxen, or ketoprofen unless instructed by your doctor. These medicines may hide a fever. Do not become pregnant while taking this medicine. Women should inform their doctor if they wish to become pregnant or think they might be pregnant. There is a potential for serious side effects to an unborn child. Talk to your health care professional or pharmacist for more information. Do not breast-feed an infant while taking this medicine. You may have vomiting or diarrhea while taking this  medicine. Drink water or other fluids as directed. What side effects may I notice from receiving this medicine? Side effects that you should report to your doctor or health care professional as soon as possible: -allergic reactions like skin rash, itching or hives, swelling of the face, lips, or tongue -breathing problems -changes in hearing -changes in vision -fast, irregular heartbeat -feeling faint or lightheaded, falls -pain, tingling, numbness in the hands or feet -seizures -swelling of the ankles, feet, hands -unusual bleeding or bruising -unusually weak or tired -vomiting Side effects that usually do not require medical attention (report to your doctor or health care professional if they continue or are bothersome): -changes in emotions or moods -constipation -diarrhea -loss of appetite -headache -irritation at site where injected -nausea This list may not describe all possible side effects. Call your doctor for medical advice about side effects. You may report side effects to FDA at 1-800-FDA-1088. Where should I keep my medicine? This drug is given in a hospital or clinic and will not be stored at home. NOTE: This sheet is a summary. It may not cover all possible information. If you have questions about this medicine, talk to your doctor, pharmacist, or health care provider.  2012, [employer] Standard. (02/13/2010 11:42:36  AM)

## 2011-11-20 ENCOUNTER — Ambulatory Visit (HOSPITAL_BASED_OUTPATIENT_CLINIC_OR_DEPARTMENT_OTHER): Payer: Medicare Other

## 2011-11-20 VITALS — BP 131/71 | HR 52 | Temp 98.6°F | Resp 17

## 2011-11-20 DIAGNOSIS — Z5111 Encounter for antineoplastic chemotherapy: Secondary | ICD-10-CM

## 2011-11-20 DIAGNOSIS — Z5112 Encounter for antineoplastic immunotherapy: Secondary | ICD-10-CM

## 2011-11-20 DIAGNOSIS — C9000 Multiple myeloma not having achieved remission: Secondary | ICD-10-CM

## 2011-11-20 MED ORDER — ONDANSETRON 16 MG/50ML IVPB (CHCC)
16.0000 mg | Freq: Once | INTRAVENOUS | Status: AC
  Administered 2011-11-20: 16 mg via INTRAVENOUS

## 2011-11-20 MED ORDER — BORTEZOMIB CHEMO IV INJECTION 3.5 MG
0.7000 mg/m2 | Freq: Once | INTRAMUSCULAR | Status: AC
  Administered 2011-11-20: 1.4 mg via INTRAVENOUS
  Filled 2011-11-20: qty 1.4

## 2011-11-20 MED ORDER — DOXORUBICIN HCL LIPOSOMAL CHEMO INJECTION 2 MG/ML
15.0000 mg/m2 | Freq: Once | INTRAVENOUS | Status: AC
  Administered 2011-11-20: 30 mg via INTRAVENOUS
  Filled 2011-11-20: qty 15

## 2011-11-20 MED ORDER — SODIUM CHLORIDE 0.9 % IV SOLN
Freq: Once | INTRAVENOUS | Status: AC
  Administered 2011-11-20: 09:00:00 via INTRAVENOUS

## 2011-11-20 NOTE — Patient Instructions (Signed)
[facility] Discharge Instructions for Patients Receiving Chemotherapy  Today you received the following chemotherapy agents Velcade and Doxorubicin.  To help prevent nausea and vomiting after your treatment, we encourage you to take your nausea medication. Begin taking your nausea medication as often as prescribed for by Dr. Arbutus Ped.    If you develop nausea and vomiting that is not controlled by your nausea medication, call the clinic. If it is after clinic hours your family physician or the after hours number for the clinic or go to the Emergency Department.   BELOW ARE SYMPTOMS THAT SHOULD BE REPORTED IMMEDIATELY:  *FEVER GREATER THAN 100.5 F  *CHILLS WITH OR WITHOUT FEVER  NAUSEA AND VOMITING THAT IS NOT CONTROLLED WITH YOUR NAUSEA MEDICATION  *UNUSUAL SHORTNESS OF BREATH  *UNUSUAL BRUISING OR BLEEDING  TENDERNESS IN MOUTH AND THROAT WITH OR WITHOUT PRESENCE OF ULCERS  *URINARY PROBLEMS  *BOWEL PROBLEMS  UNUSUAL RASH Items with * indicate a potential emergency and should be followed up as soon as possible.  One of the nurses will contact you 24 hours after your treatment. Please let the nurse know about any problems that you may have experienced. Feel free to call the clinic you have any questions or concerns. The clinic phone number is [phone].   I have been informed and understand all the instructions given to me. I know to contact the clinic, my physician, or go to the Emergency Department if any problems should occur. I do not have any questions at this time, but understand that I may call the clinic during office hours   should I have any questions or need assistance in obtaining follow up care.    __________________________________________  _____________  __________ Signature of Patient or Authorized Representative            Date                   Time    __________________________________________ Nurse's Signature   DOXORUBICIN (dox oh  ROO bi sin) is a chemotherapy drug. It is used to treat many kinds of cancer like Hodgkin's disease, leukemia, non-Hodgkin's lymphoma, neuroblastoma, sarcoma, and Wilms' tumor. It is also used to treat bladder cancer, breast cancer, lung cancer, ovarian cancer, stomach cancer, and thyroid cancer. This medicine may be used for other purposes; ask your health care provider or pharmacist if you have questions. What should I tell my health care provider before I take this medicine? They need to know if you have any of these conditions: -blood disorders -heart disease, recent heart attack -infection (especially a virus infection such as chickenpox, cold sores, or herpes) -irregular heartbeat -liver disease -recent or ongoing radiation therapy -an unusual or allergic reaction to doxorubicin, other chemotherapy agents, other medicines, foods, dyes, or preservatives -pregnant or trying to get pregnant -breast-feeding How should I use this medicine? This drug is given as an infusion into a vein. It is administered in a hospital or clinic by a specially trained health care professional. If you have pain, swelling, burning or any unusual feeling around the site of your injection, tell your health care professional right away. Talk to your pediatrician regarding the use of this medicine in children. Special care may be needed. Overdosage: If you think you have taken too much of this medicine contact a poison control center or emergency room at once. NOTE: This medicine is only for you. Do not share this medicine with others. What if I miss a dose? It  is important not to miss your dose. Call your doctor or health care professional if you are unable to keep an appointment. What may interact with this medicine? Do not take this medicine with any of the following medications: -cisapride -droperidol -halofantrine -pimozide -zidovudine This medicine may also interact with the following  medications: -chloroquine -chlorpromazine -clarithromycin -cyclophosphamide -cyclosporine -erythromycin -medicines for depression, anxiety, or psychotic disturbances -medicines for irregular heart beat like amiodarone, bepridil, dofetilide, encainide, flecainide, propafenone, quinidine -medicines for seizures like ethotoin, fosphenytoin, phenytoin -medicines for nausea, vomiting like dolasetron, ondansetron, palonosetron -medicines to increase blood counts like filgrastim, pegfilgrastim, sargramostim -methadone -methotrexate -pentamidine -progesterone -vaccines -verapamil Talk to your doctor or health care professional before taking any of these medicines: -acetaminophen -aspirin -ibuprofen -ketoprofen -naproxen This list may not describe all possible interactions. Give your health care provider a list of all the medicines, herbs, non-prescription drugs, or dietary supplements you use. Also tell them if you smoke, drink alcohol, or use illegal drugs. Some items may interact with your medicine. What should I watch for while using this medicine? Your condition will be monitored carefully while you are receiving this medicine. You will need important blood work done while you are taking this medicine. This drug may make you feel generally unwell. This is not uncommon, as chemotherapy can affect healthy cells as well as cancer cells. Report any side effects. Continue your course of treatment even though you feel ill unless your doctor tells you to stop. Your urine may turn red for a few days after your dose. This is not blood. If your urine is dark or brown, call your doctor. In some cases, you may be given additional medicines to help with side effects. Follow all directions for their use. Call your doctor or health care professional for advice if you get a fever, chills or sore throat, or other symptoms of a cold or flu. Do not treat yourself. This drug decreases your body's ability to  fight infections. Try to avoid being around people who are sick. This medicine may increase your risk to bruise or bleed. Call your doctor or health care professional if you notice any unusual bleeding. Be careful brushing and flossing your teeth or using a toothpick because you may get an infection or bleed more easily. If you have any dental work done, tell your dentist you are receiving this medicine. Avoid taking products that contain aspirin, acetaminophen, ibuprofen, naproxen, or ketoprofen unless instructed by your doctor. These medicines may hide a fever. Men and women of childbearing age should use effective birth control methods while using taking this medicine. Do not become pregnant while taking this medicine. There is a potential for serious side effects to an unborn child. Talk to your health care professional or pharmacist for more information. Do not breast-feed an infant while taking this medicine. Do not let others touch your urine or other body fluids for 5 days after each treatment with this medicine. Caregivers should wear latex gloves to avoid touching body fluids during this time. What side effects may I notice from receiving this medicine? Side effects that you should report to your doctor or health care professional as soon as possible: -allergic reactions like skin rash, itching or hives, swelling of the face, lips, or tongue -low blood counts - this medicine may decrease the number of white blood cells, red blood cells and platelets. You may be at increased risk for infections and bleeding. -signs of infection - fever or chills, cough, sore throat,  pain or difficulty passing urine -signs of decreased platelets or bleeding - bruising, pinpoint red spots on the skin, black, tarry stools, blood in the urine -signs of decreased red blood cells - unusually weak or tired, fainting spells, lightheadedness -breathing problems -chest pain -fast, irregular heartbeat -mouth  sores -nausea, vomiting -pain, swelling, redness at site where injected -pain, tingling, numbness in the hands or feet -swelling of ankles, feet, or hands -unusual bleeding or bruising Side effects that usually do not require medical attention (report to your doctor or health care professional if they continue or are bothersome): -diarrhea -facial flushing -hair loss -loss of appetite -missed menstrual periods -nail discoloration or damage -red or watery eyes -red colored urine -stomach upset This list may not describe all possible side effects. Call your doctor for medical advice about side effects. You may report side effects to FDA at 1-800-FDA-1088. Where should I keep my medicine? This drug is given in a hospital or clinic and will not be stored at home. NOTE: This sheet is a summary. It may not cover all possible information. If you have questions about this medicine, talk to your doctor, pharmacist, or health care provider.  2012, [employer] Standard. (04/27/2007 5:07:32 PM)

## 2011-11-21 ENCOUNTER — Other Ambulatory Visit: Payer: Self-pay

## 2011-11-21 ENCOUNTER — Emergency Department (HOSPITAL_COMMUNITY): Payer: Medicare Other

## 2011-11-21 ENCOUNTER — Emergency Department (HOSPITAL_COMMUNITY)
Admission: EM | Admit: 2011-11-21 | Discharge: 2011-11-21 | Disposition: A | Discharge status: Home / Self Care | Payer: Medicare Other | Attending: Emergency Medicine | Admitting: Emergency Medicine

## 2011-11-21 ENCOUNTER — Encounter (HOSPITAL_COMMUNITY): Payer: Self-pay | Admitting: *Deleted

## 2011-11-21 DIAGNOSIS — R61 Generalized hyperhidrosis: Secondary | ICD-10-CM | POA: Insufficient documentation

## 2011-11-21 DIAGNOSIS — E7800 Pure hypercholesterolemia, unspecified: Secondary | ICD-10-CM | POA: Insufficient documentation

## 2011-11-21 DIAGNOSIS — R011 Cardiac murmur, unspecified: Secondary | ICD-10-CM | POA: Insufficient documentation

## 2011-11-21 DIAGNOSIS — C9000 Multiple myeloma not having achieved remission: Secondary | ICD-10-CM | POA: Insufficient documentation

## 2011-11-21 DIAGNOSIS — Z79899 Other long term (current) drug therapy: Secondary | ICD-10-CM | POA: Insufficient documentation

## 2011-11-21 DIAGNOSIS — R066 Hiccough: Secondary | ICD-10-CM | POA: Insufficient documentation

## 2011-11-21 DIAGNOSIS — R110 Nausea: Secondary | ICD-10-CM | POA: Insufficient documentation

## 2011-11-21 DIAGNOSIS — I10 Essential (primary) hypertension: Secondary | ICD-10-CM | POA: Insufficient documentation

## 2011-11-21 DIAGNOSIS — Z9484 Stem cells transplant status: Secondary | ICD-10-CM | POA: Insufficient documentation

## 2011-11-21 DIAGNOSIS — E119 Type 2 diabetes mellitus without complications: Secondary | ICD-10-CM | POA: Insufficient documentation

## 2011-11-21 DIAGNOSIS — E785 Hyperlipidemia, unspecified: Secondary | ICD-10-CM | POA: Insufficient documentation

## 2011-11-21 LAB — CBC WITH DIFFERENTIAL/PLATELET
Basophils Absolute: 0 10*3/uL (ref 0.0–0.1)
Eosinophils Absolute: 0 10*3/uL (ref 0.0–0.7)
Lymphs Abs: 1.1 10*3/uL (ref 0.7–4.0)
MCH: 24 pg — ABNORMAL LOW (ref 26.0–34.0)
Neutrophils Relative %: 63 % (ref 43–77)
Platelets: 138 10*3/uL — ABNORMAL LOW (ref 150–400)
RBC: 4.08 MIL/uL — ABNORMAL LOW (ref 4.22–5.81)
RDW: 15.2 % (ref 11.5–15.5)
WBC: 5.5 10*3/uL (ref 4.0–10.5)

## 2011-11-21 LAB — BASIC METABOLIC PANEL
Calcium: 9.3 mg/dL (ref 8.4–10.5)
GFR calc non Af Amer: 87 mL/min — ABNORMAL LOW (ref >90)
Glucose, Bld: 183 mg/dL — ABNORMAL HIGH (ref 70–99)
Potassium: 3.6 mEq/L (ref 3.5–5.1)

## 2011-11-21 LAB — OCCULT BLOOD GASTRIC / DUODENUM (SPECIMEN CUP): pH, Gastric: 2

## 2011-11-21 LAB — APTT: aPTT: 28 seconds (ref 24–37)

## 2011-11-21 LAB — PROTIME-INR: Prothrombin Time: 13.7 seconds (ref 11.6–15.2)

## 2011-11-21 MED ORDER — CHLORPROMAZINE HCL 10 MG PO TABS: 10.0000 mg | ORAL_TABLET | Freq: Two times a day (BID) | ORAL | Status: DC | PRN

## 2011-11-21 MED ORDER — SODIUM CHLORIDE 0.9 % IV SOLN
Freq: Once | INTRAVENOUS | Status: AC
  Administered 2011-11-21: 02:00:00 via INTRAVENOUS

## 2011-11-21 MED ORDER — ASPIRIN 81 MG PO CHEW
324.0000 mg | CHEWABLE_TABLET | Freq: Once | ORAL | Status: DC
  Filled 2011-11-21: qty 4

## 2011-11-21 MED ORDER — CHLORPROMAZINE HCL 25 MG PO TABS
25.0000 mg | ORAL_TABLET | Freq: Once | ORAL | Status: AC
  Administered 2011-11-21: 25 mg via ORAL
  Filled 2011-11-21: qty 1

## 2011-11-21 MED ORDER — ONDANSETRON HCL 4 MG/2ML IJ SOLN
4.0000 mg | Freq: Once | INTRAMUSCULAR | Status: AC
  Administered 2011-11-21: 4 mg via INTRAVENOUS
  Filled 2011-11-21: qty 2

## 2011-11-21 NOTE — ED Provider Notes (Signed)
History     CSN: [id number]  Arrival date & time 11/21/11  0048   First MD Initiated Contact with Patient 11/21/11 0058      Chief Complaint  Patient presents with  . Chest Pain  . Hiccups    (Consider location/radiation/quality/duration/timing/severity/associated sxs/prior treatment) HPI Comments: 66 year old male with a history of multiple myeloma, diabetes, hypercholesterolemia and hypertension who presents with a complaint of hiccups and chest pain. He states that the symptoms started several days ago, his chest pain began today, it is a pressure or a burning sensation in his upper chest and seems to be worse with the hiccups. He denies associated shortness of breath or coughing but has been diaphoretic on and off all day and has had some nausea this evening. 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 He has no known history of obstructive coronary disease.  Patient is a 66 y.o. male presenting with chest pain. The history is provided by the patient and a relative.  Chest Pain     Past Medical History  Diagnosis Date  . Multiple myeloma(203.0)   . Diabetes mellitus   . High cholesterol   . Diabetes mellitus 07/03/2011  . Hypertension 07/03/2011  . Hyperlipidemia 07/03/2011    Past Surgical History  Procedure Date  . Limbal stem cell transplant     No family history on file.  History  Substance Use Topics  . Smoking status: Never Smoker   . Smokeless tobacco: Not on file  . Alcohol Use: No      Review of Systems  Cardiovascular: Positive for chest pain.  All other systems reviewed and are negative.    Allergies  Review of patient's allergies indicates no known allergies.  Home Medications   Current Outpatient Rx  Name Route Sig Dispense Refill  . BORTEZOMIB CHEMO IV INJECTION 3.5 MG Intravenous Inject 1.4 mg into the vein once.    [name] DEXAMETHASONE 4 MG PO TABS Oral Take 1 tablet (4 mg total) by mouth once a  week. Take 10 tablets (40mg ) once weekly before chemotherapy, as directed. 40 tablet 0  . DOXORUBICIN HCL 50 MG IV SOLR Intravenous Inject 30 mg into the vein once.    [name] EZETIMIBE 10 MG PO TABS Oral Take 10 mg by mouth daily.      [name] LISINOPRIL 10 MG PO TABS Oral Take 10 mg by mouth daily.      [name] METFORMIN HCL 500 MG PO TABS Oral Take 500 mg by mouth daily with breakfast.     . PROCHLORPERAZINE MALEATE 10 MG PO TABS Oral Take 1 tablet (10 mg total) by mouth every 6 (six) hours as needed. 30 tablet 0  . SIMVASTATIN 10 MG PO TABS Oral Take 10 mg by mouth at bedtime.      [name] VALACYCLOVIR HCL 500 MG PO TABS Oral Take 1 tablet (500 mg total) by mouth daily. 30 tablet 3  . CHLORPROMAZINE HCL 10 MG PO TABS Oral Take 1 tablet (10 mg total) by mouth 2 (two) times daily as needed (hiccoughs). 20 tablet 0    BP 136/59  Pulse 73  Temp 98.7 F (37.1 C)  Resp 17  SpO2 99%  Physical Exam  Nursing note and vitals reviewed. Constitutional: He appears well-developed and well-nourished. No distress.  HENT:  Head: Normocephalic and atraumatic.  Mouth/Throat: Oropharynx is clear and moist. No oropharyngeal exudate.  Eyes: Conjunctivae normal and EOM are normal. Pupils are  equal, round, and reactive to light. Right eye exhibits no discharge. Left eye exhibits no discharge. No scleral icterus.  Neck: Normal range of motion. Neck supple. No JVD present. No thyromegaly present.  Cardiovascular: Normal rate, regular rhythm and intact distal pulses.  Exam reveals no gallop and no friction rub.   Murmur (soft systolic murmur) heard. Pulmonary/Chest: Effort normal and breath sounds normal. No respiratory distress. He has no wheezes. He has no rales.       Frequent hiccups, normal lung sounds  Abdominal: Soft. Bowel sounds are normal. He exhibits no distension and no mass. There is no tenderness.  Musculoskeletal: Normal range of motion. He exhibits no edema and no tenderness.  Lymphadenopathy:    He has no  cervical adenopathy.  Neurological: He is alert. Coordination normal.  Skin: Skin is warm. No rash noted. No erythema.       Diaphoretic  Psychiatric: He has a normal mood and affect. His behavior is normal.    ED Course  Procedures (including critical care time)  Labs Reviewed  BASIC METABOLIC PANEL - Abnormal; Notable for the following:    Sodium 134 (*)     Glucose, Bld 183 (*)     GFR calc non Af Amer 87 (*)     All other components within normal limits  CBC WITH DIFFERENTIAL - Abnormal; Notable for the following:    RBC 4.08 (*)     Hemoglobin 9.8 (*)     HCT 30.1 (*)     MCV 73.8 (*)     MCH 24.0 (*)     Platelets 138 (*)     Monocytes Relative 16 (*)     All other components within normal limits  OCCULT BLOOD GASTRIC / DUODENUM - Abnormal; Notable for the following:    Occult Blood, Gastric POSITIVE (*)     All other components within normal limits  APTT  PROTIME-INR  TROPONIN I  TROPONIN I   Dg Chest Port 1 View  11/21/2011  *RADIOLOGY REPORT*  Clinical Data: Chest pain.  Hiccups for 3 days.  PORTABLE CHEST - 1 VIEW  Comparison: PA and lateral chest 01/04/2011.  Findings: Lungs are clear.  Heart size is normal.  No pneumothorax or pleural fluid.  IMPRESSION: No acute disease.   Original Report Authenticated By: Holley Dexter, M.D.      1. Hiccoughs       MDM  The patient is diaphoretic, he has atypical chest pain associated with hiccups, would consider cardiac source would also consider chemotherapy related as he has had this in the past. He does have multiple risk factors for coronary syndrome.  ED ECG REPORT  I personally interpreted this EKG   Date: 11/21/2011   Rate: 58  Rhythm: sinus bradycardia  QRS Axis: normal  Intervals: normal  ST/T Wave abnormalities: normal  Conduction Disutrbances:none  Narrative Interpretation:   Old EKG Reviewed: Compared with 08/28/2010, no significant changes  I have reevaluated the patient, 2:30 AM, patient is  feeling much better, has ongoing hiccups and states that the chest pain is only brief and when he hiccups. There has been no more diaphoresis and has no shortness of breath. Thus far the laboratory data suggests that there is no renal dysfunction, normal troponin, anemia is a hemoglobin of 9.8 which is consistent with prior values. Chest x-ray shows no signs of acute injury or dysfunction such as no infiltrates or mediastinal abnormalities or pneumothorax. We'll obtain a second troponin, repeat EKG and anticipate  discharge. Again the patient has had intermittent hiccups in the past mostly associated with chemotherapy for his multiple myeloma. He has been given Thorazine and Zofran with minimal improvement of the hiccups.    ED ECG REPORT  I personally interpreted this EKG   Date: 11/21/2011   Rate: 71  Rhythm: normal sinus rhythm  QRS Axis: normal  Intervals: normal  ST/T Wave abnormalities: normal  Conduction Disutrbances:none  Narrative Interpretation:   Old EKG Reviewed: unchanged   Pt stable, ECG repeat and trop normal, unlikely ACS, stable for d/c, home with thorazine.   Vida Roller, MD 11/21/11 [id number]

## 2011-11-21 NOTE — ED Notes (Signed)
Pt with episode of n/v md informed.

## 2011-11-21 NOTE — ED Notes (Signed)
Pt states has had hiccups on and off since Tuesday; associated with chest pain; feels like pressure is getting worse in his chest today

## 2011-11-24 ENCOUNTER — Emergency Department (HOSPITAL_COMMUNITY)
Admission: EM | Admit: 2011-11-24 | Discharge: 2011-11-24 | Disposition: A | Discharge status: Home / Self Care | Payer: Medicare Other | Attending: Emergency Medicine | Admitting: Emergency Medicine

## 2011-11-24 ENCOUNTER — Other Ambulatory Visit (HOSPITAL_BASED_OUTPATIENT_CLINIC_OR_DEPARTMENT_OTHER): Payer: Medicare Other | Admitting: Lab

## 2011-11-24 ENCOUNTER — Other Ambulatory Visit: Payer: Self-pay | Admitting: Oncology

## 2011-11-24 ENCOUNTER — Encounter (HOSPITAL_COMMUNITY): Payer: Self-pay | Admitting: *Deleted

## 2011-11-24 ENCOUNTER — Ambulatory Visit (HOSPITAL_BASED_OUTPATIENT_CLINIC_OR_DEPARTMENT_OTHER): Payer: Medicare Other

## 2011-11-24 VITALS — BP 135/76 | HR 96 | Temp 98.0°F

## 2011-11-24 DIAGNOSIS — E7800 Pure hypercholesterolemia, unspecified: Secondary | ICD-10-CM | POA: Insufficient documentation

## 2011-11-24 DIAGNOSIS — C9000 Multiple myeloma not having achieved remission: Secondary | ICD-10-CM | POA: Insufficient documentation

## 2011-11-24 DIAGNOSIS — R112 Nausea with vomiting, unspecified: Secondary | ICD-10-CM

## 2011-11-24 DIAGNOSIS — E785 Hyperlipidemia, unspecified: Secondary | ICD-10-CM | POA: Insufficient documentation

## 2011-11-24 DIAGNOSIS — Z79899 Other long term (current) drug therapy: Secondary | ICD-10-CM | POA: Insufficient documentation

## 2011-11-24 DIAGNOSIS — E119 Type 2 diabetes mellitus without complications: Secondary | ICD-10-CM | POA: Insufficient documentation

## 2011-11-24 DIAGNOSIS — I10 Essential (primary) hypertension: Secondary | ICD-10-CM | POA: Insufficient documentation

## 2011-11-24 DIAGNOSIS — R066 Hiccough: Secondary | ICD-10-CM

## 2011-11-24 LAB — CBC WITH DIFFERENTIAL/PLATELET
Basophils Absolute: 0 10*3/uL (ref 0.0–0.1)
Eosinophils Absolute: 0 10*3/uL (ref 0.0–0.5)
HCT: 32.1 % — ABNORMAL LOW (ref 38.4–49.9)
HGB: 10.2 g/dL — ABNORMAL LOW (ref 13.0–17.1)
LYMPH%: 24.8 % (ref 14.0–49.0)
MCV: 75.4 fL — ABNORMAL LOW (ref 79.3–98.0)
MONO#: 0.7 10*3/uL (ref 0.1–0.9)
MONO%: 17.2 % — ABNORMAL HIGH (ref 0.0–14.0)
NEUT#: 2.2 10*3/uL (ref 1.5–6.5)
NEUT%: 58 % (ref 39.0–75.0)
Platelets: 200 10*3/uL (ref 140–400)
RBC: 4.26 10*6/uL (ref 4.20–5.82)

## 2011-11-24 LAB — POCT I-STAT, CHEM 8
Calcium, Ion: 1.13 mmol/L (ref 1.13–1.30)
Chloride: 103 mEq/L (ref 96–112)
Hemoglobin: 11.6 g/dL — ABNORMAL LOW (ref 13.0–17.0)

## 2011-11-24 LAB — COMPREHENSIVE METABOLIC PANEL (CC13)
BUN: 36 mg/dL — ABNORMAL HIGH (ref 7.0–26.0)
CO2: 29 mEq/L (ref 22–29)
Calcium: 9.8 mg/dL (ref 8.4–10.4)
Chloride: 102 mEq/L (ref 98–107)
Creatinine: 1.1 mg/dL (ref 0.7–1.3)
Glucose: 243 mg/dl — ABNORMAL HIGH (ref 70–99)
Total Bilirubin: 1.98 mg/dL — ABNORMAL HIGH (ref 0.20–1.20)

## 2011-11-24 LAB — POCT I-STAT TROPONIN I

## 2011-11-24 LAB — LACTATE DEHYDROGENASE (CC13): LDH: 188 U/L (ref 125–220)

## 2011-11-24 MED ORDER — SODIUM CHLORIDE 0.9 % IV SOLN
Freq: Once | INTRAVENOUS | Status: AC
  Administered 2011-11-24: 09:00:00 via INTRAVENOUS

## 2011-11-24 MED ORDER — DIPHENHYDRAMINE HCL 50 MG/ML IJ SOLN
12.5000 mg | Freq: Once | INTRAMUSCULAR | Status: AC
  Administered 2011-11-24: 12.5 mg via INTRAVENOUS
  Filled 2011-11-24: qty 1

## 2011-11-24 MED ORDER — BORTEZOMIB CHEMO IV INJECTION 3.5 MG
0.7000 mg/m2 | Freq: Once | INTRAMUSCULAR | Status: DC
  Filled 2011-11-24: qty 1.4

## 2011-11-24 MED ORDER — SODIUM CHLORIDE 0.9 % IV SOLN
25.0000 mg | Freq: Once | INTRAVENOUS | Status: AC
  Administered 2011-11-24: 25 mg via INTRAVENOUS
  Filled 2011-11-24: qty 1

## 2011-11-24 MED ORDER — ONDANSETRON 16 MG/50ML IVPB (CHCC)
16.0000 mg | Freq: Once | INTRAVENOUS | Status: AC
  Administered 2011-11-24: 16 mg via INTRAVENOUS

## 2011-11-24 MED ORDER — ONDANSETRON 8 MG/50ML IVPB (CHCC): 8.0000 mg | Freq: Once | INTRAVENOUS | Status: DC

## 2011-11-24 NOTE — ED Notes (Signed)
Pt received in rm 4, pt is from cancer center where he was supposed to get his chemo treatment, however he was unable to get it due to N/V and chest pain. Pt received Zofran IV at cancer center, without relief, pt had 3-4 episodes of emesis during triage. Pt is unable to lay in the bed for [place] to perform an ekg.

## 2011-11-24 NOTE — Patient Instructions (Addendum)
[facility] Discharge Instructions for Patients Receiving Chemotherapy  Today you received the following chemotherapy agents Velcade To help prevent nausea and vomiting after your treatment, we encourage you to take your nausea medication as prescribed.  If you develop nausea and vomiting that is not controlled by your nausea medication, call the clinic. If it is after clinic hours your family physician or the after hours number for the clinic or go to the Emergency Department.   BELOW ARE SYMPTOMS THAT SHOULD BE REPORTED IMMEDIATELY:  *FEVER GREATER THAN 100.5 F  *CHILLS WITH OR WITHOUT FEVER  NAUSEA AND VOMITING THAT IS NOT CONTROLLED WITH YOUR NAUSEA MEDICATION  *UNUSUAL SHORTNESS OF BREATH  *UNUSUAL BRUISING OR BLEEDING  TENDERNESS IN MOUTH AND THROAT WITH OR WITHOUT PRESENCE OF ULCERS  *URINARY PROBLEMS  *BOWEL PROBLEMS  UNUSUAL RASH Items with * indicate a potential emergency and should be followed up as soon as possible.  One of the nurses will contact you 24 hours after your treatment. Please let the nurse know about any problems that you may have experienced. Feel free to call the clinic you have any questions or concerns. The clinic phone number is [phone].   I have been informed and understand all the instructions given to me. I know to contact the clinic, my physician, or go to the Emergency Department if any problems should occur. I do not have any questions at this time, but understand that I may call the clinic during office hours   should I have any questions or need assistance in obtaining follow up care.    __________________________________________  _____________  __________ Signature of Patient or Authorized Representative            Date                   Time    __________________________________________ Nurse's Signature     Dehydration, Adult Dehydration is when you lose more fluids from the body than you take in. Vital  organs like the kidneys, brain, and heart cannot function without a proper amount of fluids and salt. Any loss of fluids from the body can cause dehydration.  CAUSES   Vomiting.  Diarrhea.  Excessive sweating.  Excessive urine output.  Fever. SYMPTOMS  Mild dehydration  Thirst.  Dry lips.  Slightly dry mouth. Moderate dehydration  Very dry mouth.  Sunken eyes.  Skin does not bounce back quickly when lightly pinched and released.  Dark urine and decreased urine production.  Decreased tear production.  Headache. Severe dehydration  Very dry mouth.  Extreme thirst.  Rapid, weak pulse (more than 100 beats per minute at rest).  Cold hands and feet.  Not able to sweat in spite of heat and temperature.  Rapid breathing.  Blue lips.  Confusion and lethargy.  Difficulty being awakened.  Minimal urine production.  No tears. DIAGNOSIS  Your caregiver will diagnose dehydration based on your symptoms and your exam. Blood and urine tests will help confirm the diagnosis. The diagnostic evaluation should also identify the cause of dehydration. TREATMENT  Treatment of mild or moderate dehydration can often be done at home by increasing the amount of fluids that you drink. It is best to drink small amounts of fluid more often. Drinking too much at one time can make vomiting worse. Refer to the home care instructions below. Severe dehydration needs to be treated at the hospital where you will probably be given intravenous (IV) fluids that contain water and  electrolytes. HOME CARE INSTRUCTIONS   Ask your caregiver about specific rehydration instructions.  Drink enough fluids to keep your urine clear or pale yellow.  Drink small amounts frequently if you have nausea and vomiting.  Eat as you normally do.  Avoid:  Foods or drinks high in sugar.  Carbonated drinks.  Juice.  Extremely hot or cold fluids.  Drinks with caffeine.  Fatty, greasy  foods.  Alcohol.  Tobacco.  Overeating.  Gelatin desserts.  Wash your hands well to avoid spreading bacteria and viruses.  Only take over-the-counter or prescription medicines for pain, discomfort, or fever as directed by your caregiver.  Ask your caregiver if you should continue all prescribed and over-the-counter medicines.  Keep all follow-up appointments with your caregiver. SEEK MEDICAL CARE IF:  You have abdominal pain and it increases or stays in one area (localizes).  You have a rash, stiff neck, or severe headache.  You are irritable, sleepy, or difficult to awaken.  You are weak, dizzy, or extremely thirsty. SEEK IMMEDIATE MEDICAL CARE IF:   You are unable to keep fluids down or you get worse despite treatment.  You have frequent episodes of vomiting or diarrhea.  You have blood or green matter (bile) in your vomit.  You have blood in your stool or your stool looks black and tarry.  You have not urinated in 6 to 8 hours, or you have only urinated a small amount of very dark urine.  You have a fever.  You faint. MAKE SURE YOU:   Understand these instructions.  Will watch your condition.  Will get help right away if you are not doing well or get worse. Document Released: 01/06/2005 Document Revised: 03/31/2011 Document Reviewed: 08/26/2010 [facility] Patient Information 2013 [place], [place].

## 2011-11-24 NOTE — Progress Notes (Signed)
Patient states he feels "lousy". Patient states he had one episode of vomiting without nausea this morning when drinking water. Patient states he had 6 to 7 episodes of vomiting yesterday without vomiting. Patient states he has not ate solid food in 3 to 4 days. Patient also states he has had hiccoughs since October 31 when he was assessed in the ED. A prescription for Thorazine was given to him. Patient states he was unable to have prescription filled due to pharmacies not carrying the prescription. Patient states pharmacy told him he should be able to obtain the Thorazine prescription either today or tomorrow.  Dr. Arbutus Ped notified, order for normal saline and Zofran 16 mg IV given and carried out.   Ok to treat with IV velcade today, patient should finish IV fluids per Dr.Mohamed.  1055 Patient complains of heaviness in his chest. Blood pressure 145/67, pulse 96, oxygen saturation 100% on room air. Dr. Arbutus Ped notified.  Patient transferred to ED via wheelchair, patient placed in room # 4 in ED, report given to [place], Charity fundraiser.

## 2011-11-24 NOTE — ED Provider Notes (Addendum)
History     CSN: [id number]  Arrival date & time 11/24/11  1114   First MD Initiated Contact with Patient 11/24/11 1131      Chief Complaint  Patient presents with  . Chest Pain  . Hiccups     HPI Pt was at the cancer center where he started having chest pain and heaviness. Pt was in ED recently with c/o constant hiccups, was given a prescription for it which was not filled, pt sts pharmacy didn't have it. Pt is c/o nausea, vomiting that just started as well as hiccups and chest pain at this time.  Past Medical History  Diagnosis Date  . Multiple myeloma(203.0)   . Diabetes mellitus   . High cholesterol   . Diabetes mellitus 07/03/2011  . Hypertension 07/03/2011  . Hyperlipidemia 07/03/2011    Past Surgical History  Procedure Date  . Limbal stem cell transplant     History reviewed. No pertinent family history.  History  Substance Use Topics  . Smoking status: Never Smoker   . Smokeless tobacco: Not on file  . Alcohol Use: No      Review of Systems  All other systems reviewed and are negative.    Allergies  Review of patient's allergies indicates no known allergies.  Home Medications   Current Outpatient Rx  Name  Route  Sig  Dispense  Refill  . BORTEZOMIB CHEMO IV INJECTION 3.5 MG   Intravenous   Inject 1.4 mg into the vein once.         [name] DEXAMETHASONE 4 MG PO TABS   Oral   Take 1 tablet (4 mg total) by mouth once a week. Take 10 tablets (40mg ) once weekly before chemotherapy, as directed.   40 tablet   0   . EZETIMIBE 10 MG PO TABS   Oral   Take 10 mg by mouth daily.           [name] LISINOPRIL 10 MG PO TABS   Oral   Take 10 mg by mouth daily.           [name] METFORMIN HCL 500 MG PO TABS   Oral   Take 500 mg by mouth daily with breakfast.          . PROCHLORPERAZINE MALEATE 10 MG PO TABS   Oral   Take 1 tablet (10 mg total) by mouth every 6 (six) hours as needed.   30 tablet   0   . SIMVASTATIN 10 MG PO TABS   Oral   Take 10 mg by  mouth at bedtime.           [name] VALACYCLOVIR HCL 500 MG PO TABS   Oral   Take 1 tablet (500 mg total) by mouth daily.   30 tablet   3   . CHLORPROMAZINE HCL 10 MG PO TABS   Oral   Take 10 mg by mouth 2 (two) times daily as needed.         [name] DOXORUBICIN HCL 50 MG IV SOLR   Intravenous   Inject 30 mg into the vein once.           BP 113/69  Pulse 99  Temp 98.2 F (36.8 C) (Oral)  Resp 16  SpO2 98%  Physical Exam  Nursing note and vitals reviewed. Constitutional: He is oriented to person, place, and time. He appears well-developed and well-nourished. No distress (actively hicoughing).       Actively having hiccups  HENT:  Head: Normocephalic and atraumatic.  Eyes: Pupils are equal, round, and reactive to light.  Neck: Normal range of motion.  Cardiovascular: Normal rate and intact distal pulses.   Pulmonary/Chest: No respiratory distress.  Abdominal: Normal appearance. He exhibits no distension.  Musculoskeletal: Normal range of motion.  Neurological: He is alert and oriented to person, place, and time. No cranial nerve deficit.  Skin: Skin is warm and dry. No rash noted.  Psychiatric: He has a normal mood and affect. His behavior is normal.    ED Course  Procedures (including critical care time)  Medications  chlorproMAZINE (THORAZINE) 10 MG tablet (not administered)  chlorproMAZINE (THORAZINE) 25 mg in sodium chloride 0.9 % 25 mL IVPB (25 mg Intravenous Given 11/24/11 1205)  diphenhydrAMINE (BENADRYL) injection 12.5 mg (12.5 mg Intravenous Given 11/24/11 1154)    Labs Reviewed  POCT I-STAT, CHEM 8 - Abnormal; Notable for the following:    BUN 30 (*)     Glucose, Bld 213 (*)     Hemoglobin 11.6 (*)     HCT 34.0 (*)     All other components within normal limits  POCT I-STAT TROPONIN I   No results found.   1. Hiccoughs       MDM  After treatment in the ED the patient feels back to baseline and wants to go home.         Nelia Shi,  MD 11/24/11 2053  Nelia Shi, MD 11/24/11 [phone]

## 2011-11-24 NOTE — ED Notes (Signed)
Pt was at the cancer center where he started having chest pain and heaviness. Pt was in ED recently with c/o constant hiccups, was given a prescription for it which was not filled, pt sts pharmacy didn't have it. Pt is c/o nausea, vomiting that just started as well as hiccups and chest pain at this time.

## 2011-11-24 NOTE — Addendum Note (Signed)
Addended by: Neita Goodnight on: 11/24/2011 01:47 PM   Modules accepted: Orders

## 2011-11-25 ENCOUNTER — Telehealth: Payer: Self-pay | Admitting: *Deleted

## 2011-11-25 ENCOUNTER — Other Ambulatory Visit: Payer: Self-pay | Admitting: Certified Registered Nurse Anesthetist

## 2011-11-25 NOTE — Telephone Encounter (Signed)
Pt called wanting to know whether he needed to do anything different with his schedule since he missed his velcade dose on Monday (11/4).  Per Dr Donnald Garre, proceed with tx plan as scheduled, will return 11/7.  Pt was released from the ED with no cardiac issues, hiccups have not stopped.  Per Dr Donnald Garre pt is to get thorazine rx and take for 2-3 days, if he does not see any change he is to call so we can try another rx.  SLJ

## 2011-11-26 ENCOUNTER — Telehealth: Payer: Self-pay | Admitting: *Deleted

## 2011-11-26 DIAGNOSIS — C9000 Multiple myeloma not having achieved remission: Secondary | ICD-10-CM

## 2011-11-26 MED ORDER — ONDANSETRON HCL 8 MG PO TABS: 8.0000 mg | ORAL_TABLET | Freq: Three times a day (TID) | ORAL | Status: DC | PRN

## 2011-11-26 NOTE — Telephone Encounter (Signed)
Pt is having problems with vomiting and is unable to keep down any liquids or food.  Per Dr Donnald Garre, zofran called into the pharmacy.  Pt is to call if he does not get relief from the zofran.  SLJ

## 2011-11-27 ENCOUNTER — Ambulatory Visit: Payer: Medicare Other | Admitting: Nutrition

## 2011-11-27 ENCOUNTER — Ambulatory Visit (HOSPITAL_BASED_OUTPATIENT_CLINIC_OR_DEPARTMENT_OTHER): Payer: Medicare Other

## 2011-11-27 ENCOUNTER — Ambulatory Visit: Payer: Medicare Other

## 2011-11-27 ENCOUNTER — Other Ambulatory Visit: Payer: Self-pay | Admitting: Physician Assistant

## 2011-11-27 VITALS — BP 144/66 | HR 73 | Temp 98.1°F

## 2011-11-27 DIAGNOSIS — Z5112 Encounter for antineoplastic immunotherapy: Secondary | ICD-10-CM

## 2011-11-27 DIAGNOSIS — C9000 Multiple myeloma not having achieved remission: Secondary | ICD-10-CM

## 2011-11-27 MED ORDER — BORTEZOMIB CHEMO SQ INJECTION 3.5 MG (2.5MG/ML)
0.7000 mg/m2 | Freq: Once | INTRAMUSCULAR | Status: AC
  Administered 2011-11-27: 1.5 mg via SUBCUTANEOUS
  Filled 2011-11-27: qty 1.5

## 2011-11-27 MED ORDER — ONDANSETRON HCL 8 MG PO TABS
8.0000 mg | ORAL_TABLET | Freq: Once | ORAL | Status: AC
  Administered 2011-11-27: 8 mg via ORAL

## 2011-11-27 NOTE — Progress Notes (Signed)
Eddie Loft PA talked with patient re: sx of hiccups and nausea.

## 2011-11-27 NOTE — Progress Notes (Signed)
Received a call from nursing requesting I meet with patient today in the chemotherapy area. This patient is a 66 year old male patient of Dr. Shirline Frees diagnosed with multiple myeloma.  Past medical history includes diabetes, hypertension, and hyperlipidemia.  Medications include Thorazine, Decadron, Zetia, Glucophage, Zofran, Compazine, and Zocor.  Labs include glucose of 243 and BUN 36.  Height: 6 feet 1 inch. Weight 171.6 pounds October 21. Usual body weight: 170 pounds. BMI 22.64.  Patient reports ongoing issues with hiccups and nausea and vomiting. He has made several trips to the emergency room. He is requesting information on eating with nausea to minimize further weight loss. He reports he has lost about 10 pounds in the last week. He also reports poor appetite. He denies other nutritional side effects.  Nutrition diagnosis: Food and nutrition related knowledge deficit related to diagnosis of multiple myeloma and associated treatments as evidenced by no prior need for nutrition related information.  Intervention: I educated patient on the importance of consuming bland, cold foods every hour to 2 hours daily while awake. I've encouraged patient to consume protein foods first. I've given specific examples of meals or snacks patient could add to his diet. I've encouraged patient to consume Ensure Plus or boost plus twice a day to 3 times a day for additional calories and protein. I provided recipes for patient. I've answered all his questions. I've given him fact sheets and coupons to take with him.  Monitoring, evaluation, and goals: Patient will tolerate oral diet to minimize further weight loss.  Next visit: Thursday, November 14, during chemotherapy.

## 2011-11-27 NOTE — Patient Instructions (Signed)
[facility] Discharge Instructions for Patients Receiving Chemotherapy  Today you received the following chemotherapy agents velcade  To help prevent nausea and vomiting after your treatment, we encourage you to take your nausea medication zofran and compazine Begin taking it tonight and take it as often as prescribed    If you develop nausea and vomiting that is not controlled by your nausea medication, call the clinic. If it is after clinic hours your family physician or the after hours number for the clinic or go to the Emergency Department.   BELOW ARE SYMPTOMS THAT SHOULD BE REPORTED IMMEDIATELY:  *FEVER GREATER THAN 100.5 F  *CHILLS WITH OR WITHOUT FEVER  NAUSEA AND VOMITING THAT IS NOT CONTROLLED WITH YOUR NAUSEA MEDICATION  *UNUSUAL SHORTNESS OF BREATH  *UNUSUAL BRUISING OR BLEEDING  TENDERNESS IN MOUTH AND THROAT WITH OR WITHOUT PRESENCE OF ULCERS  *URINARY PROBLEMS  *BOWEL PROBLEMS  UNUSUAL RASH Items with * indicate a potential emergency and should be followed up as soon as possible.  . Feel free to call the clinic you have any questions or concerns. The clinic phone number is [phone].   I have been informed and understand all the instructions given to me. I know to contact the clinic, my physician, or go to the Emergency Department if any problems should occur. I do not have any questions at this time, but understand that I may call the clinic during office hours   should I have any questions or need assistance in obtaining follow up care.    __________________________________________  _____________  __________ Signature of Patient or Authorized Representative            Date                   Time    __________________________________________ Nurse's Signature

## 2011-11-28 ENCOUNTER — Telehealth: Payer: Self-pay | Admitting: *Deleted

## 2011-11-28 DIAGNOSIS — K219 Gastro-esophageal reflux disease without esophagitis: Secondary | ICD-10-CM

## 2011-11-28 MED ORDER — PANTOPRAZOLE SODIUM 40 MG PO TBEC: 40.0000 mg | DELAYED_RELEASE_TABLET | Freq: Every day | ORAL | Status: DC

## 2011-11-28 NOTE — Telephone Encounter (Signed)
Pt called stating that after he eats he has a burning sensation and has some chest discomfort.  Pt was evaluated for chest pains on 11/4 and all tests came back negative.  Per Tiana Loft, PA-C, okay to give pt protonix.  Pt verbalized understanding.  Also discussed port placement with pt.  Educated pt on the port a cath and he said he would like to think about it before making the decision.  SLJ

## 2011-12-04 ENCOUNTER — Ambulatory Visit: Payer: Medicare Other | Admitting: Nutrition

## 2011-12-04 ENCOUNTER — Ambulatory Visit (HOSPITAL_BASED_OUTPATIENT_CLINIC_OR_DEPARTMENT_OTHER): Payer: Medicare Other

## 2011-12-04 VITALS — BP 127/66 | HR 53 | Temp 97.7°F | Resp 18

## 2011-12-04 DIAGNOSIS — C9000 Multiple myeloma not having achieved remission: Secondary | ICD-10-CM

## 2011-12-04 MED ORDER — ZOLEDRONIC ACID 4 MG/5ML IV CONC
4.0000 mg | Freq: Once | INTRAVENOUS | Status: AC
  Administered 2011-12-04: 4 mg via INTRAVENOUS
  Filled 2011-12-04: qty 5

## 2011-12-04 NOTE — Patient Instructions (Signed)
[facility] Discharge Instructions for Patients Receiving Chemotherapy  Today you received the following chemotherapy agents Zometa To help prevent nausea and vomiting after your treatment, we encourage you to take your nausea medication as prescribed. If you develop nausea and vomiting that is not controlled by your nausea medication, call the clinic. If it is after clinic hours your family physician or the after hours number for the clinic or go to the Emergency Department.   BELOW ARE SYMPTOMS THAT SHOULD BE REPORTED IMMEDIATELY:  *FEVER GREATER THAN 100.5 F  *CHILLS WITH OR WITHOUT FEVER  NAUSEA AND VOMITING THAT IS NOT CONTROLLED WITH YOUR NAUSEA MEDICATION  *UNUSUAL SHORTNESS OF BREATH  *UNUSUAL BRUISING OR BLEEDING  TENDERNESS IN MOUTH AND THROAT WITH OR WITHOUT PRESENCE OF ULCERS  *URINARY PROBLEMS  *BOWEL PROBLEMS  UNUSUAL RASH Items with * indicate a potential emergency and should be followed up as soon as possible.  One of the nurses will contact you 24 hours after your treatment. Please let the nurse know about any problems that you may have experienced. Feel free to call the clinic you have any questions or concerns. The clinic phone number is [phone].   I have been informed and understand all the instructions given to me. I know to contact the clinic, my physician, or go to the Emergency Department if any problems should occur. I do not have any questions at this time, but understand that I may call the clinic during office hours   should I have any questions or need assistance in obtaining follow up care.    __________________________________________  _____________  __________ Signature of Patient or Authorized Representative            Date                   Time    __________________________________________ Nurse's Signature    

## 2011-12-04 NOTE — Progress Notes (Signed)
Patient reports that he is eating better. He has improved nausea and vomiting. At this point his hiccups have also improved. There is no new weight documented in computer. Last weight was 171.6 pounds October 21. Patient has no nutrition questions today.  Nutrition diagnosis: Food and nutrition related knowledge deficit improved.  Intervention: I enforced the importance of continuing frequent high-protein foods throughout the day. I have educated again on strategies for eating if nauseated. Patient was encouraged to continue Ensure Plus or boost +3 times daily to promote weight gain. He declines additional coupons today.  Monitoring, evaluation, goals: Patient has had increased oral intake however am not able to evaluate weight.  Next visit: Thursday, November 21 during chemotherapy.

## 2011-12-08 ENCOUNTER — Telehealth: Payer: Self-pay | Admitting: Internal Medicine

## 2011-12-08 ENCOUNTER — Telehealth: Payer: Self-pay | Admitting: *Deleted

## 2011-12-08 ENCOUNTER — Ambulatory Visit (HOSPITAL_BASED_OUTPATIENT_CLINIC_OR_DEPARTMENT_OTHER): Payer: Self-pay | Admitting: Physician Assistant

## 2011-12-08 ENCOUNTER — Ambulatory Visit: Payer: Medicare Other | Admitting: Lab

## 2011-12-08 ENCOUNTER — Other Ambulatory Visit (HOSPITAL_BASED_OUTPATIENT_CLINIC_OR_DEPARTMENT_OTHER): Payer: Medicare Other | Admitting: Lab

## 2011-12-08 ENCOUNTER — Ambulatory Visit (HOSPITAL_BASED_OUTPATIENT_CLINIC_OR_DEPARTMENT_OTHER): Payer: Medicare Other

## 2011-12-08 VITALS — BP 130/76 | HR 76 | Temp 97.6°F | Resp 18 | Ht 73.0 in | Wt 160.2 lb

## 2011-12-08 DIAGNOSIS — C9000 Multiple myeloma not having achieved remission: Secondary | ICD-10-CM

## 2011-12-08 DIAGNOSIS — Z5112 Encounter for antineoplastic immunotherapy: Secondary | ICD-10-CM

## 2011-12-08 DIAGNOSIS — Z23 Encounter for immunization: Secondary | ICD-10-CM

## 2011-12-08 DIAGNOSIS — M899 Disorder of bone, unspecified: Secondary | ICD-10-CM

## 2011-12-08 LAB — CBC WITH DIFFERENTIAL/PLATELET
Basophils Absolute: 0 10*3/uL (ref 0.0–0.1)
HCT: 28.8 % — ABNORMAL LOW (ref 38.4–49.9)
HGB: 9 g/dL — ABNORMAL LOW (ref 13.0–17.1)
MONO#: 0.3 10*3/uL (ref 0.1–0.9)
NEUT%: 80.4 % — ABNORMAL HIGH (ref 39.0–75.0)
WBC: 4.4 10*3/uL (ref 4.0–10.3)
lymph#: 0.5 10*3/uL — ABNORMAL LOW (ref 0.9–3.3)

## 2011-12-08 LAB — COMPREHENSIVE METABOLIC PANEL (CC13)
BUN: 8 mg/dL (ref 7.0–26.0)
CO2: 28 mEq/L (ref 22–29)
Calcium: 9.4 mg/dL (ref 8.4–10.4)
Chloride: 100 mEq/L (ref 98–107)
Creatinine: 0.9 mg/dL (ref 0.7–1.3)
Glucose: 256 mg/dl — ABNORMAL HIGH (ref 70–99)

## 2011-12-08 LAB — LACTATE DEHYDROGENASE (CC13): LDH: 230 U/L (ref 125–245)

## 2011-12-08 MED ORDER — BORTEZOMIB CHEMO SQ INJECTION 3.5 MG (2.5MG/ML)
0.7000 mg/m2 | Freq: Once | INTRAMUSCULAR | Status: AC
  Administered 2011-12-08: 1.5 mg via SUBCUTANEOUS
  Filled 2011-12-08: qty 1.5

## 2011-12-08 MED ORDER — HAEMOPHILUS B POLYSAC CONJ VAC IM SOLR: 0.5000 mL | Freq: Once | INTRAMUSCULAR | Status: DC

## 2011-12-08 MED ORDER — PNEUMOCOCCAL 13-VAL CONJ VACC IM SUSP
0.5000 mL | Freq: Once | INTRAMUSCULAR | Status: AC
  Administered 2011-12-08: 0.5 mL via INTRAMUSCULAR
  Filled 2011-12-08: qty 0.5

## 2011-12-08 MED ORDER — DTAP-HEPATITIS B RECOMB-IPV IM SUSP
0.5000 mL | Freq: Once | INTRAMUSCULAR | Status: AC
  Administered 2011-12-08: 0.5 mL via INTRAMUSCULAR
  Filled 2011-12-08: qty 0.5

## 2011-12-08 MED ORDER — ONDANSETRON HCL 8 MG PO TABS
8.0000 mg | ORAL_TABLET | Freq: Once | ORAL | Status: AC
  Administered 2011-12-08: 8 mg via ORAL

## 2011-12-08 MED ORDER — HAEMOPHILUS B POLYSAC CONJ VAC IM SOLR
0.5000 mL | Freq: Once | INTRAMUSCULAR | Status: AC
  Administered 2011-12-08: 0.5 mL via INTRAMUSCULAR
  Filled 2011-12-08: qty 0.5

## 2011-12-08 NOTE — Patient Instructions (Addendum)
[facility] Discharge Instructions for Patients Receiving Chemotherapy  Today you received the following chemotherapy agents Velcade To help prevent nausea and vomiting after your treatment, we encourage you to take your nausea medication as per dr. Arbutus Ped  If you develop nausea and vomiting that is not controlled by your nausea medication, call the clinic. If it is after clinic hours your family physician or the after hours number for the clinic or go to the Emergency Department.   BELOW ARE SYMPTOMS THAT SHOULD BE REPORTED IMMEDIATELY:  *FEVER GREATER THAN 100.5 F  *CHILLS WITH OR WITHOUT FEVER  NAUSEA AND VOMITING THAT IS NOT CONTROLLED WITH YOUR NAUSEA MEDICATION  *UNUSUAL SHORTNESS OF BREATH  *UNUSUAL BRUISING OR BLEEDING  TENDERNESS IN MOUTH AND THROAT WITH OR WITHOUT PRESENCE OF ULCERS  *URINARY PROBLEMS  *BOWEL PROBLEMS  UNUSUAL RASH Items with * indicate a potential emergency and should be followed up as soon as possible.   Feel free to call the clinic you have any questions or concerns. The clinic phone number is [phone].   I have been informed and understand all the instructions given to me. I know to contact the clinic, my physician, or go to the Emergency Department if any problems should occur. I do not have any questions at this time, but understand that I may call the clinic during office hours   should I have any questions or need assistance in obtaining follow up care.    __________________________________________  _____________  __________ Signature of Patient or Authorized Representative            Date                   Time    __________________________________________ Nurse's Signature

## 2011-12-08 NOTE — Telephone Encounter (Signed)
appts made and printed for pt aom °

## 2011-12-08 NOTE — Telephone Encounter (Signed)
Per staff message and POF I have schedueld appts.  JMW  

## 2011-12-08 NOTE — Patient Instructions (Addendum)
Continue with labs and chemotherapy as scheduled Followup in 3 weeks 

## 2011-12-09 ENCOUNTER — Other Ambulatory Visit: Payer: Self-pay | Admitting: Certified Registered Nurse Anesthetist

## 2011-12-10 ENCOUNTER — Other Ambulatory Visit: Payer: Self-pay | Admitting: Internal Medicine

## 2011-12-11 ENCOUNTER — Other Ambulatory Visit: Payer: Self-pay | Admitting: Internal Medicine

## 2011-12-11 ENCOUNTER — Encounter: Payer: Medicare Other | Admitting: Nutrition

## 2011-12-11 ENCOUNTER — Ambulatory Visit (HOSPITAL_BASED_OUTPATIENT_CLINIC_OR_DEPARTMENT_OTHER): Payer: Medicare Other

## 2011-12-11 VITALS — BP 116/70 | HR 75 | Temp 98.4°F | Resp 18

## 2011-12-11 DIAGNOSIS — Z5111 Encounter for antineoplastic chemotherapy: Secondary | ICD-10-CM

## 2011-12-11 DIAGNOSIS — C9000 Multiple myeloma not having achieved remission: Secondary | ICD-10-CM

## 2011-12-11 DIAGNOSIS — Z5112 Encounter for antineoplastic immunotherapy: Secondary | ICD-10-CM

## 2011-12-11 MED ORDER — DOXORUBICIN HCL LIPOSOMAL CHEMO INJECTION 2 MG/ML
30.0000 mg/m2 | Freq: Once | INTRAVENOUS | Status: AC
  Administered 2011-12-11: 60 mg via INTRAVENOUS
  Filled 2011-12-11: qty 30

## 2011-12-11 MED ORDER — BORTEZOMIB CHEMO SQ INJECTION 3.5 MG (2.5MG/ML)
0.7000 mg/m2 | Freq: Once | INTRAMUSCULAR | Status: AC
  Administered 2011-12-11: 1.5 mg via SUBCUTANEOUS
  Filled 2011-12-11: qty 1.5

## 2011-12-11 MED ORDER — ONDANSETRON 16 MG/50ML IVPB (CHCC)
16.0000 mg | Freq: Once | INTRAVENOUS | Status: AC
  Administered 2011-12-11: 16 mg via INTRAVENOUS

## 2011-12-11 MED ORDER — ONDANSETRON HCL 8 MG PO TABS: 8.0000 mg | ORAL_TABLET | Freq: Once | ORAL | Status: DC

## 2011-12-11 MED ORDER — SODIUM CHLORIDE 0.9 % IV SOLN
Freq: Once | INTRAVENOUS | Status: AC
  Administered 2011-12-11: 08:00:00 via INTRAVENOUS

## 2011-12-11 NOTE — Patient Instructions (Signed)
[facility] Discharge Instructions for Patients Receiving Chemotherapy  Today you received the following chemotherapy agents: Velcade and Doxil. To help prevent nausea and vomiting after your treatment, we encourage you to take your nausea medication, Zofran. Begin taking it early morning 12/12/11 and take it every eight hours for the next 72 hours. Take Compazine every six hours as needed for nausea. May take without regard to last Zofran dose. Compazine may make you drowsy.   If you develop nausea and vomiting that is not controlled by your nausea medication, call the clinic. If it is after clinic hours your family physician or the after hours number for the clinic or go to the Emergency Department.   BELOW ARE SYMPTOMS THAT SHOULD BE REPORTED IMMEDIATELY:  *FEVER GREATER THAN 100.5 F  *CHILLS WITH OR WITHOUT FEVER  NAUSEA AND VOMITING THAT IS NOT CONTROLLED WITH YOUR NAUSEA MEDICATION  *UNUSUAL SHORTNESS OF BREATH  *UNUSUAL BRUISING OR BLEEDING  TENDERNESS IN MOUTH AND THROAT WITH OR WITHOUT PRESENCE OF ULCERS  *URINARY PROBLEMS  *BOWEL PROBLEMS  UNUSUAL RASH Items with * indicate a potential emergency and should be followed up as soon as possible.  Please let the nurse know about any problems that you may have experienced. Feel free to call the clinic you have any questions or concerns. The clinic phone number is [phone].   I have been informed and understand all the instructions given to me. I know to contact the clinic, my physician, or go to the Emergency Department if any problems should occur. I do not have any questions at this time, but understand that I may call the clinic during office hours   should I have any questions or need assistance in obtaining follow up care.    __________________________________________  _____________  __________ Signature of Patient or Authorized Representative            Date                    Time    __________________________________________ Nurse's Signature

## 2011-12-11 NOTE — Telephone Encounter (Signed)
Duplicate--Called and verified receipt of Rx on 11/20

## 2011-12-14 NOTE — Progress Notes (Signed)
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   PRIOR THERAPY: :  1. Status post 6 cycles of systemic chemotherapy with Revlimid and Decadron, last dose was given 07/21/2010 with very good response. 2. 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 Lance Bosch.  3. maintenance Revlimid at 10 mg by mouth daily status post 2 months. Therapy began 01/18/2011. 4. maintenance Revlimid at 15 mg by mouth daily with prophylactic dose Coumadin at 2 mg by mouth daily  CURRENT THERAPY:  1.  Zometa 4 mg IV every 4 weeks.  2. Systemic chemotherapy with Velcade at 1.3 mg per meter squared given on days 1, 4, 8 and 11 and Doxil at 30 mg per meter square given on day 4 and Decadron 40 mg by mouth on weekly basis given every 3 weeks. Status post 1 cycle     INTERVAL HISTORY: Eddie Thomas 66 y.o. male returns to the clinic today for routine followup visit. He reports 2 emergency room visits for chest pain one on 11/21/2011 and the second one on 11/24/2011. Cardiac reasons for the chest pain 4 well. The exception of the chest pain and significant loss of energy and decreased appetite and nausea and vomiting particularly when he gets the Doxil he is otherwise tolerating the systemic chemotherapy with Velcade Doxil and Decadron. He is having increasing difficulty with peripheral venous access and is still thinking about getting a Port-A-Cath. He denied having any significant weight loss or night sweats. He has no bleeding issues. No palpable lymphadenopathy. He denied having any further episodes of chest pain, shortness breath, cough or hemoptysis.   MEDICAL HISTORY: Past Medical History  Diagnosis Date  . Multiple myeloma(203.0)   . Diabetes mellitus   .  High cholesterol   . Diabetes mellitus 07/03/2011  . Hypertension 07/03/2011  . Hyperlipidemia 07/03/2011    ALLERGIES:   has no known allergies.  MEDICATIONS:  Current Outpatient Prescriptions  Medication Sig Dispense Refill  . bortezomib IV (VELCADE) 3.5 MG injection Inject 1.4 mg into the vein once.      . chlorproMAZINE (THORAZINE) 10 MG tablet Take 10 mg by mouth 2 (two) times daily as needed.      [name] dexamethasone (DECADRON) 4 MG tablet TAKE 10 TABLETS BY MOUTH ONCE WEEKLY BEFORE CHEMO  40 tablet  0  . DOXOrubicin (ADRIAMYCIN) 50 MG chemo injection Inject 30 mg into the vein once.      . ezetimibe (ZETIA) 10 MG tablet Take 10 mg by mouth daily.        . haemophilus B polysaccharide conjugate vaccine (ACTHIB) injection Inject 0.5 mLs into the muscle once.  1 each  0  . lisinopril (PRINIVIL,ZESTRIL) 10 MG tablet Take 10 mg by mouth daily.        . metFORMIN (GLUCOPHAGE) 500 MG tablet Take 500 mg by mouth daily with breakfast.       . ondansetron (ZOFRAN) 8 MG tablet Take 1 tablet (8 mg total) by mouth every 8 (eight) hours as needed for nausea.  30 tablet  0  . pantoprazole (PROTONIX) 40 MG tablet Take 1 tablet (40 mg total) by mouth daily. Take 30 minutes to 1 hour before 1st meal of the day.  30 tablet  1  .  prochlorperazine (COMPAZINE) 10 MG tablet Take 1 tablet (10 mg total) by mouth every 6 (six) hours as needed.  30 tablet  0  . simvastatin (ZOCOR) 10 MG tablet Take 10 mg by mouth at bedtime.        . valACYclovir (VALTREX) 500 MG tablet Take 1 tablet (500 mg total) by mouth daily.  30 tablet  3    SURGICAL HISTORY:  Past Surgical History  Procedure Date  . Limbal stem cell transplant     REVIEW OF SYSTEMS:  A comprehensive review of systems was negative except for: Constitutional: positive for anorexia Gastrointestinal: positive for nausea and vomiting Musculoskeletal: positive for Chest pain   PHYSICAL EXAMINATION: General appearance: alert, cooperative and no  distress Head: Normocephalic, without obvious abnormality, atraumatic Neck: no adenopathy Lymph nodes: Cervical, supraclavicular, and axillary nodes normal. Resp: clear to auscultation bilaterally Cardio: regular rate and rhythm, S1, S2 normal, no murmur, click, rub or gallop GI: soft, non-tender; bowel sounds normal; no masses,  no organomegaly Extremities: extremities normal, atraumatic, no cyanosis or edema Neurologic: Alert and oriented X 3, normal strength and tone. Normal symmetric reflexes. Normal coordination and gait  ECOG PERFORMANCE STATUS: 0 - Asymptomatic  Blood pressure 130/76, pulse 76, temperature 97.6 F (36.4 C), temperature source Oral, resp. rate 18, height 6\' 1"  (1.854 m), weight 160 lb 3.2 oz (72.666 kg).  LABORATORY DATA: Lab Results  Component Value Date   WBC 4.4 12/08/2011   HGB 9.0* 12/08/2011   HCT 28.8* 12/08/2011   MCV 75.0* 12/08/2011   PLT 162 12/08/2011      Chemistry      Component Value Date/Time   NA 135* 12/08/2011 1121   NA 142 11/24/2011 1159   K 4.1 12/08/2011 1121   K 4.0 11/24/2011 1159   CL 100 12/08/2011 1121   CL 103 11/24/2011 1159   CO2 28 12/08/2011 1121   CO2 24 11/21/2011 0135   BUN 8.0 12/08/2011 1121   BUN 30* 11/24/2011 1159   CREATININE 0.9 12/08/2011 1121   CREATININE 1.10 11/24/2011 1159      Component Value Date/Time   CALCIUM 9.4 12/08/2011 1121   CALCIUM 9.3 11/21/2011 0135   ALKPHOS 56 12/08/2011 1121   ALKPHOS 46 09/10/2011 1232   AST 19 12/08/2011 1121   AST 22 09/10/2011 1232   ALT 21 12/08/2011 1121   ALT 35 09/10/2011 1232   BILITOT 0.93 12/08/2011 1121   BILITOT 1.8* 09/10/2011 1232       RADIOGRAPHIC STUDIES: No results found.  ASSESSMENT/PLAN: This is a very pleasant 66 years old African American male with history of multiple myeloma, IgA subtype diagnosed in December of 2011 status post systemic chemotherapy with Revlimid and Decadron followed by peripheral blood autologous stem cell transplant and  has been on maintenance Revlimid since December of 2012. Unfortunately the patient has evidence for disease progression with worsening myeloma panel. He is now being treated with systemic chemotherapy in the form of Velcade 1.3 mg per meter square given on days 1, 4, 8 and 11, Doxil 30 mg per meter square given on day 4 and Decadron 40 mg by mouth on weekly basis given every 3 weeks, status post 1 cycle. Patient was discussed with Dr. Arbutus Ped. He will proceed with cycle #2 of his systemic chemotherapy with Velcade Doxil and Decadron as scheduled. He may need to be scheduled for Port-A-Cath insertion soon. He has had 14 months of monthly Zometa and we will continue the Zometa on a  monthly basis for a total of 2 years before changing the frequency. He'll follow up in 3 weeks with repeat CBC differential and C. met and LDH prior to his next cycle of chemotherapy.  Laural Benes, Druscilla Petsch E, PA-C   All questions were answered. The patient knows to call the clinic with any problems, questions or concerns. We can certainly see the patient much sooner if necessary.  I spent 15 minutes counseling the patient face to face. The total time spent in the appointment was 25 minutes.

## 2011-12-15 ENCOUNTER — Ambulatory Visit (HOSPITAL_BASED_OUTPATIENT_CLINIC_OR_DEPARTMENT_OTHER): Payer: Medicare Other

## 2011-12-15 ENCOUNTER — Telehealth: Payer: Self-pay | Admitting: Medical Oncology

## 2011-12-15 ENCOUNTER — Other Ambulatory Visit (HOSPITAL_BASED_OUTPATIENT_CLINIC_OR_DEPARTMENT_OTHER): Payer: Medicare Other | Admitting: Lab

## 2011-12-15 VITALS — BP 125/66 | HR 63 | Temp 98.3°F

## 2011-12-15 DIAGNOSIS — C9000 Multiple myeloma not having achieved remission: Secondary | ICD-10-CM

## 2011-12-15 DIAGNOSIS — Z5112 Encounter for antineoplastic immunotherapy: Secondary | ICD-10-CM

## 2011-12-15 LAB — CBC WITH DIFFERENTIAL/PLATELET
Basophils Absolute: 0 10*3/uL (ref 0.0–0.1)
EOS%: 3.5 % (ref 0.0–7.0)
Eosinophils Absolute: 0.1 10*3/uL (ref 0.0–0.5)
HGB: 9.5 g/dL — ABNORMAL LOW (ref 13.0–17.1)
MCH: 24.3 pg — ABNORMAL LOW (ref 27.2–33.4)
NEUT#: 2.6 10*3/uL (ref 1.5–6.5)
RDW: 16.4 % — ABNORMAL HIGH (ref 11.0–14.6)
lymph#: 0.6 10*3/uL — ABNORMAL LOW (ref 0.9–3.3)

## 2011-12-15 LAB — COMPREHENSIVE METABOLIC PANEL (CC13)
AST: 16 U/L (ref 5–34)
Albumin: 2.9 g/dL — ABNORMAL LOW (ref 3.5–5.0)
BUN: 8 mg/dL (ref 7.0–26.0)
Calcium: 9.5 mg/dL (ref 8.4–10.4)
Chloride: 100 mEq/L (ref 98–107)
Potassium: 4 mEq/L (ref 3.5–5.1)
Total Protein: 7.5 g/dL (ref 6.4–8.3)

## 2011-12-15 LAB — LACTATE DEHYDROGENASE (CC13): LDH: 173 U/L (ref 125–245)

## 2011-12-15 MED ORDER — BORTEZOMIB CHEMO SQ INJECTION 3.5 MG (2.5MG/ML)
1.3000 mg/m2 | Freq: Once | INTRAMUSCULAR | Status: AC
  Administered 2011-12-15: 2.5 mg via SUBCUTANEOUS
  Filled 2011-12-15: qty 2.5

## 2011-12-15 MED ORDER — ONDANSETRON HCL 8 MG PO TABS
8.0000 mg | ORAL_TABLET | Freq: Once | ORAL | Status: AC
  Administered 2011-12-15: 8 mg via ORAL

## 2011-12-15 NOTE — Patient Instructions (Addendum)
[facility] Discharge Instructions for Patients Receiving Chemotherapy  Today you received the following chemotherapy agents velcade  To help prevent nausea and vomiting after your treatment, we encourage you to take your nausea medication as perscribed Begin taking it as needed and take it as often as prescribed for the next 72 hours hours.   If you develop nausea and vomiting that is not controlled by your nausea medication, call the clinic. If it is after clinic hours your family physician or the after hours number for the clinic or go to the Emergency Department.   BELOW ARE SYMPTOMS THAT SHOULD BE REPORTED IMMEDIATELY:  *FEVER GREATER THAN 100.5 F  *CHILLS WITH OR WITHOUT FEVER  NAUSEA AND VOMITING THAT IS NOT CONTROLLED WITH YOUR NAUSEA MEDICATION  *UNUSUAL SHORTNESS OF BREATH  *UNUSUAL BRUISING OR BLEEDING  TENDERNESS IN MOUTH AND THROAT WITH OR WITHOUT PRESENCE OF ULCERS  *URINARY PROBLEMS  *BOWEL PROBLEMS  UNUSUAL RASH Items with * indicate a potential emergency and should be followed up as soon as possible.  One of the nurses will contact you 24 hours after your treatment. Please let the nurse know about any problems that you may have experienced. Feel free to call the clinic you have any questions or concerns. The clinic phone number is [phone].   I have been informed and understand all the instructions given to me. I know to contact the clinic, my physician, or go to the Emergency Department if any problems should occur. I do not have any questions at this time, but understand that I may call the clinic during office hours   should I have any questions or need assistance in obtaining follow up care.    __________________________________________  _____________  __________ Signature of Patient or Authorized Representative            Date                   Time    __________________________________________ Nurse's Signature

## 2011-12-15 NOTE — Telephone Encounter (Signed)
Called in decadron rx

## 2011-12-16 ENCOUNTER — Other Ambulatory Visit: Payer: Self-pay | Admitting: Certified Registered Nurse Anesthetist

## 2011-12-19 ENCOUNTER — Ambulatory Visit (HOSPITAL_BASED_OUTPATIENT_CLINIC_OR_DEPARTMENT_OTHER): Payer: Medicare Other

## 2011-12-19 VITALS — BP 109/66 | HR 67 | Temp 97.9°F

## 2011-12-19 DIAGNOSIS — Z5112 Encounter for antineoplastic immunotherapy: Secondary | ICD-10-CM

## 2011-12-19 DIAGNOSIS — C9000 Multiple myeloma not having achieved remission: Secondary | ICD-10-CM

## 2011-12-19 MED ORDER — ONDANSETRON HCL 8 MG PO TABS: 8.0000 mg | ORAL_TABLET | Freq: Once | ORAL | Status: DC

## 2011-12-19 MED ORDER — SODIUM CHLORIDE 0.9 % IV SOLN: Freq: Once | INTRAVENOUS | Status: DC

## 2011-12-19 MED ORDER — ONDANSETRON 8 MG/50ML IVPB (CHCC): 8.0000 mg | Freq: Once | INTRAVENOUS | Status: DC

## 2011-12-19 MED ORDER — BORTEZOMIB CHEMO SQ INJECTION 3.5 MG (2.5MG/ML)
1.3000 mg/m2 | Freq: Once | INTRAMUSCULAR | Status: AC
  Administered 2011-12-19: 2.5 mg via SUBCUTANEOUS
  Filled 2011-12-19: qty 2.5

## 2011-12-19 NOTE — Patient Instructions (Signed)
[facility] Discharge Instructions for Patients Receiving Chemotherapy  Today you received the following chemotherapy agents: velcade  To help prevent nausea and vomiting after your treatment, we encourage you to take your nausea medication.  Take it as often as prescribed.     If you develop nausea and vomiting that is not controlled by your nausea medication, call the clinic. If it is after clinic hours your family physician or the after hours number for the clinic or go to the Emergency Department.   BELOW ARE SYMPTOMS THAT SHOULD BE REPORTED IMMEDIATELY:  *FEVER GREATER THAN 100.5 F  *CHILLS WITH OR WITHOUT FEVER  NAUSEA AND VOMITING THAT IS NOT CONTROLLED WITH YOUR NAUSEA MEDICATION  *UNUSUAL SHORTNESS OF BREATH  *UNUSUAL BRUISING OR BLEEDING  TENDERNESS IN MOUTH AND THROAT WITH OR WITHOUT PRESENCE OF ULCERS  *URINARY PROBLEMS  *BOWEL PROBLEMS  UNUSUAL RASH Items with * indicate a potential emergency and should be followed up as soon as possible.  Feel free to call the clinic you have any questions or concerns. The clinic phone number is [phone].   I have been informed and understand all the instructions given to me. I know to contact the clinic, my physician, or go to the Emergency Department if any problems should occur. I do not have any questions at this time, but understand that I may call the clinic during office hours   should I have any questions or need assistance in obtaining follow up care.    __________________________________________  _____________  __________ Signature of Patient or Authorized Representative            Date                   Time    __________________________________________ Nurse's Signature    

## 2011-12-25 ENCOUNTER — Ambulatory Visit: Payer: Medicare Other

## 2011-12-25 NOTE — Progress Notes (Signed)
Pt aware today's appt is extra appt.  This is his off week for Velcade and is not due for Zometa yet.  Confirmed with Kathlee Nations, RN.  Spoke with pt, he is aware to come back for labs, appt with Tiana Loft, PA and Velcade next week-dhp, rn

## 2011-12-26 ENCOUNTER — Encounter: Payer: Self-pay | Admitting: *Deleted

## 2011-12-29 ENCOUNTER — Telehealth: Payer: Self-pay | Admitting: *Deleted

## 2011-12-29 ENCOUNTER — Ambulatory Visit (HOSPITAL_BASED_OUTPATIENT_CLINIC_OR_DEPARTMENT_OTHER): Payer: Medicare Other | Admitting: Physician Assistant

## 2011-12-29 ENCOUNTER — Ambulatory Visit: Payer: Medicare Other | Admitting: Nutrition

## 2011-12-29 ENCOUNTER — Encounter: Payer: Self-pay | Admitting: Physician Assistant

## 2011-12-29 ENCOUNTER — Ambulatory Visit (HOSPITAL_BASED_OUTPATIENT_CLINIC_OR_DEPARTMENT_OTHER): Payer: Medicare Other

## 2011-12-29 ENCOUNTER — Other Ambulatory Visit (HOSPITAL_BASED_OUTPATIENT_CLINIC_OR_DEPARTMENT_OTHER): Payer: Medicare Other

## 2011-12-29 ENCOUNTER — Telehealth: Payer: Self-pay | Admitting: Internal Medicine

## 2011-12-29 VITALS — BP 135/80 | HR 76 | Temp 97.0°F | Resp 20 | Ht 73.0 in | Wt 160.7 lb

## 2011-12-29 DIAGNOSIS — Z5112 Encounter for antineoplastic immunotherapy: Secondary | ICD-10-CM

## 2011-12-29 DIAGNOSIS — C9000 Multiple myeloma not having achieved remission: Secondary | ICD-10-CM

## 2011-12-29 LAB — CBC WITH DIFFERENTIAL/PLATELET
Basophils Absolute: 0 10*3/uL (ref 0.0–0.1)
HCT: 28.6 % — ABNORMAL LOW (ref 38.4–49.9)
HGB: 9.3 g/dL — ABNORMAL LOW (ref 13.0–17.1)
LYMPH%: 23.8 % (ref 14.0–49.0)
MONO#: 0.4 10*3/uL (ref 0.1–0.9)
NEUT%: 61.6 % (ref 39.0–75.0)
Platelets: 133 10*3/uL — ABNORMAL LOW (ref 140–400)
WBC: 3 10*3/uL — ABNORMAL LOW (ref 4.0–10.3)
lymph#: 0.7 10*3/uL — ABNORMAL LOW (ref 0.9–3.3)

## 2011-12-29 LAB — COMPREHENSIVE METABOLIC PANEL (CC13)
Alkaline Phosphatase: 89 U/L (ref 40–150)
CO2: 27 mEq/L (ref 22–29)
Creatinine: 0.8 mg/dL (ref 0.7–1.3)
Glucose: 149 mg/dl — ABNORMAL HIGH (ref 70–99)
Sodium: 140 mEq/L (ref 136–145)
Total Bilirubin: 1.29 mg/dL — ABNORMAL HIGH (ref 0.20–1.20)
Total Protein: 6 g/dL — ABNORMAL LOW (ref 6.4–8.3)

## 2011-12-29 MED ORDER — ONDANSETRON HCL 8 MG PO TABS
8.0000 mg | ORAL_TABLET | Freq: Once | ORAL | Status: AC
  Administered 2011-12-29: 8 mg via ORAL

## 2011-12-29 MED ORDER — BORTEZOMIB CHEMO SQ INJECTION 3.5 MG (2.5MG/ML)
1.3000 mg/m2 | Freq: Once | INTRAMUSCULAR | Status: AC
  Administered 2011-12-29: 2.5 mg via SUBCUTANEOUS
  Filled 2011-12-29: qty 2.5

## 2011-12-29 NOTE — Progress Notes (Signed)
Patient reports he feels well. His weight is stable and was documented as 160.7 pounds, which is stable from 160.2 pounds November 18. He denies nausea and vomiting. His hiccups have improved. He reports he eats much better the week after he receives treatment.  Nutrition diagnosis: Food and nutrition related knowledge deficit has improved.  Intervention: Patient was encouraged to continue smaller more frequent meals with increased calories and protein every time he eats. I provided samples of chocolate oral nutrition supplements along with coupons for patient to take with him. I recommended he may add these supplements right after treatment to increase his intake. Patient verbalizes understanding.  Monitoring, evaluation, goals: Patient's oral intake has promoted weight maintenance. He will work to increase oral intake the week treatment.   Next visit: Thursday, December 19, during treatment.

## 2011-12-29 NOTE — Progress Notes (Signed)
[facility] Telephone:(336) [phone]   Fax:(336) [phone]  OFFICE PROGRESS NOTE  [name], PA [address] 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   PRIOR THERAPY: :  1. Status post 6 cycles of systemic chemotherapy with Revlimid and Decadron, last dose was given 07/21/2010 with very good response. 2. 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 Lance Bosch.  3. maintenance Revlimid at 10 mg by mouth daily status post 2 months. Therapy began 01/18/2011. 4. maintenance Revlimid at 15 mg by mouth daily with prophylactic dose Coumadin at 2 mg by mouth daily  CURRENT THERAPY:  1.  Zometa 4 mg IV every 4 weeks.  2. Systemic chemotherapy with Velcade at 1.3 mg per meter squared given on days 1, 4, 8 and 11 and Doxil at 30 mg per meter square given on day 4 and Decadron 40 mg by mouth on weekly basis given every 3 weeks. Status post 2 cycles     INTERVAL HISTORY: Draken Farrior Eggert 66 y.o. male returns to the clinic today for routine followup visit. Overall is tolerating this chemotherapy relatively well. He does complain of decreased appetite. He's lost approximately 11 pounds since the latter part of October. Reports a little numbness in the bottom of his feet but states it is not causing any difficulty with walking. He denies any falls. He states he is not sleeping very well. He also reports some nausea if he lives on his back. The Compazine controls her nausea very well. He voiced no other specific complaints. Continues to have some difficulty with peripheral venous access. Currently his Velcade has been changed from IV to the subcutaneous route of leaving only the Doxil to be given intravenously. He will continue to give getting a Port-A-Cath some thought. He denied having any  night sweats. He has no bleeding issues. No palpable lymphadenopathy.  He denied having any further episodes of chest pain, shortness breath, cough or hemoptysis.   MEDICAL HISTORY: Past Medical History  Diagnosis Date  . Multiple myeloma(203.0)   . Diabetes mellitus   . High cholesterol   . Diabetes mellitus 07/03/2011  . Hypertension 07/03/2011  . Hyperlipidemia 07/03/2011    ALLERGIES:   has no known allergies.  MEDICATIONS:  Current Outpatient Prescriptions  Medication Sig Dispense Refill  . bortezomib IV (VELCADE) 3.5 MG injection Inject 1.4 mg into the vein once.      . chlorproMAZINE (THORAZINE) 10 MG tablet Take 10 mg by mouth 2 (two) times daily as needed.      [name] dexamethasone (DECADRON) 4 MG tablet TAKE 10 TABLETS BY MOUTH ONCE WEEKLY BEFORE CHEMO  40 tablet  0  . DOXOrubicin (ADRIAMYCIN) 50 MG chemo injection Inject 30 mg into the vein once.      . ezetimibe (ZETIA) 10 MG tablet Take 10 mg by mouth daily.        . haemophilus B polysaccharide conjugate vaccine (ACTHIB) injection Inject 0.5 mLs into the muscle once.  1 each  0  . lisinopril (PRINIVIL,ZESTRIL) 10 MG tablet Take 10 mg by mouth daily.        . metFORMIN (GLUCOPHAGE) 500 MG tablet Take 500 mg by mouth daily with breakfast.       . ondansetron (ZOFRAN) 8 MG tablet Take 1 tablet (8 mg total) by mouth every 8 (eight) hours as needed for nausea.  30  tablet  0  . pantoprazole (PROTONIX) 40 MG tablet Take 1 tablet (40 mg total) by mouth daily. Take 30 minutes to 1 hour before 1st meal of the day.  30 tablet  1  . prochlorperazine (COMPAZINE) 10 MG tablet Take 1 tablet (10 mg total) by mouth every 6 (six) hours as needed.  30 tablet  0  . simvastatin (ZOCOR) 10 MG tablet Take 10 mg by mouth at bedtime.        . valACYclovir (VALTREX) 500 MG tablet Take 1 tablet (500 mg total) by mouth daily.  30 tablet  3    SURGICAL HISTORY:  Past Surgical History  Procedure Date  . Limbal stem cell transplant     REVIEW OF SYSTEMS:  A comprehensive review of systems was negative except for:  Constitutional: positive for anorexia Gastrointestinal: positive for nausea   PHYSICAL EXAMINATION: General appearance: alert, cooperative and no distress Head: Normocephalic, without obvious abnormality, atraumatic Neck: no adenopathy Lymph nodes: Cervical, supraclavicular, and axillary nodes normal. Resp: clear to auscultation bilaterally Cardio: regular rate and rhythm, S1, S2 normal, no murmur, click, rub or gallop GI: soft, non-tender; bowel sounds normal; no masses,  no organomegaly Extremities: extremities normal, atraumatic, no cyanosis or edema Neurologic: Alert and oriented X 3, normal strength and tone. Normal symmetric reflexes. Normal coordination and gait  ECOG PERFORMANCE STATUS: 0 - Asymptomatic  Blood pressure 135/80, pulse 76, temperature 97 F (36.1 C), temperature source Oral, resp. rate 20, height 6\' 1"  (1.854 m), weight 160 lb 11.2 oz (72.893 kg).  LABORATORY DATA: Lab Results  Component Value Date   WBC 3.0* 12/29/2011   HGB 9.3* 12/29/2011   HCT 28.6* 12/29/2011   MCV 76.6* 12/29/2011   PLT 133* 12/29/2011      Chemistry      Component Value Date/Time   NA 139 12/15/2011 0818   NA 142 11/24/2011 1159   K 4.0 12/15/2011 0818   K 4.0 11/24/2011 1159   CL 100 12/15/2011 0818   CL 103 11/24/2011 1159   CO2 28 12/15/2011 0818   CO2 24 11/21/2011 0135   BUN 8.0 12/15/2011 0818   BUN 30* 11/24/2011 1159   CREATININE 1.0 12/15/2011 0818   CREATININE 1.10 11/24/2011 1159      Component Value Date/Time   CALCIUM 9.5 12/15/2011 0818   CALCIUM 9.3 11/21/2011 0135   ALKPHOS 52 12/15/2011 0818   ALKPHOS 46 09/10/2011 1232   AST 16 12/15/2011 0818   AST 22 09/10/2011 1232   ALT 12 12/15/2011 0818   ALT 35 09/10/2011 1232   BILITOT 0.89 12/15/2011 0818   BILITOT 1.8* 09/10/2011 1232       RADIOGRAPHIC STUDIES: No results found.  ASSESSMENT/PLAN: This is a very pleasant 66 years old African American male with history of multiple myeloma, IgA subtype diagnosed in  December of 2011 status post systemic chemotherapy with Revlimid and Decadron followed by peripheral blood autologous stem cell transplant and has been on maintenance Revlimid since December of 2012. Unfortunately the patient has evidence for disease progression with worsening myeloma panel. He is now being treated with systemic chemotherapy in the form of Velcade 1.3 mg per meter square given on days 1, 4, 8 and 11, Doxil 30 mg per meter square given on day 4 and Decadron 40 mg by mouth on weekly basis given every 3 weeks, status post 2 cycles. Patient was discussed with Dr. Arbutus Ped. The Velcade is now being given subcutaneously. He'll proceed with cycle #3  of his chemotherapy with Velcade and Doxil and Decadron as scheduled. He'll followup with Dr. Arbutus Ped in 3 weeks with a repeat CBC differential, C. met, LDH, quantitative immunoglobulin, beta 2 microglobulin and serum light chains to reevaluate his disease.   Laural Benes, Marvetta Vohs E, PA-C   All questions were answered. The patient knows to call the clinic with any problems, questions or concerns. We can certainly see the patient much sooner if necessary.  I spent 20 minutes counseling the patient face to face. The total time spent in the appointment was 30 minutes.

## 2011-12-29 NOTE — Telephone Encounter (Signed)
gv and printed appt schedule for pt for Dec....emailed michelle to add chemo...the patient aware °

## 2011-12-29 NOTE — Telephone Encounter (Signed)
Per staff message and POF I have scheduled appts.  JMW  

## 2011-12-29 NOTE — Patient Instructions (Signed)
Patient aware of next appointment; discharged to see Nutritionist, with no complaints.

## 2011-12-29 NOTE — Patient Instructions (Addendum)
Follow up with Dr. Arbutus Ped in 3 weeks with repeat protein studies to re-evaluate your disease

## 2011-12-31 ENCOUNTER — Emergency Department (HOSPITAL_COMMUNITY)
Admission: EM | Admit: 2011-12-31 | Discharge: 2011-12-31 | Disposition: A | Discharge status: Home / Self Care | Payer: Medicare Other | Attending: Emergency Medicine | Admitting: Emergency Medicine

## 2011-12-31 ENCOUNTER — Encounter (HOSPITAL_COMMUNITY): Payer: Self-pay | Admitting: Emergency Medicine

## 2011-12-31 DIAGNOSIS — Z79899 Other long term (current) drug therapy: Secondary | ICD-10-CM | POA: Insufficient documentation

## 2011-12-31 DIAGNOSIS — R112 Nausea with vomiting, unspecified: Secondary | ICD-10-CM

## 2011-12-31 DIAGNOSIS — R066 Hiccough: Secondary | ICD-10-CM

## 2011-12-31 DIAGNOSIS — I10 Essential (primary) hypertension: Secondary | ICD-10-CM | POA: Insufficient documentation

## 2011-12-31 DIAGNOSIS — E860 Dehydration: Secondary | ICD-10-CM

## 2011-12-31 DIAGNOSIS — E785 Hyperlipidemia, unspecified: Secondary | ICD-10-CM | POA: Insufficient documentation

## 2011-12-31 DIAGNOSIS — E119 Type 2 diabetes mellitus without complications: Secondary | ICD-10-CM | POA: Insufficient documentation

## 2011-12-31 DIAGNOSIS — C9000 Multiple myeloma not having achieved remission: Secondary | ICD-10-CM | POA: Insufficient documentation

## 2011-12-31 DIAGNOSIS — E876 Hypokalemia: Secondary | ICD-10-CM | POA: Insufficient documentation

## 2011-12-31 LAB — COMPREHENSIVE METABOLIC PANEL
ALT: 16 U/L (ref 0–53)
AST: 16 U/L (ref 0–37)
Albumin: 3.7 g/dL (ref 3.5–5.2)
Alkaline Phosphatase: 102 U/L (ref 39–117)
CO2: 26 mEq/L (ref 19–32)
Chloride: 95 mEq/L — ABNORMAL LOW (ref 96–112)
Creatinine, Ser: 0.87 mg/dL (ref 0.50–1.35)
Potassium: 2.8 mEq/L — ABNORMAL LOW (ref 3.5–5.1)
Sodium: 136 mEq/L (ref 135–145)
Total Bilirubin: 1.2 mg/dL (ref 0.3–1.2)

## 2011-12-31 LAB — URINALYSIS, MICROSCOPIC ONLY
Glucose, UA: 100 mg/dL — AB
Hgb urine dipstick: NEGATIVE
Ketones, ur: 15 mg/dL — AB
Leukocytes, UA: NEGATIVE
pH: 8.5 — ABNORMAL HIGH (ref 5.0–8.0)

## 2011-12-31 LAB — CBC WITH DIFFERENTIAL/PLATELET
Basophils Absolute: 0 10*3/uL (ref 0.0–0.1)
Lymphocytes Relative: 14 % (ref 12–46)
Lymphs Abs: 0.8 10*3/uL (ref 0.7–4.0)
Neutro Abs: 4.2 10*3/uL (ref 1.7–7.7)
Platelets: 192 10*3/uL (ref 150–400)
RBC: 4.29 MIL/uL (ref 4.22–5.81)
RDW: 16 % — ABNORMAL HIGH (ref 11.5–15.5)
WBC: 6 10*3/uL (ref 4.0–10.5)

## 2011-12-31 MED ORDER — LORAZEPAM 1 MG PO TABS: 1.0000 mg | ORAL_TABLET | Freq: Three times a day (TID) | ORAL | Status: DC | PRN

## 2011-12-31 MED ORDER — PROMETHAZINE HCL 25 MG RE SUPP: 25.0000 mg | Freq: Four times a day (QID) | RECTAL | Status: DC | PRN

## 2011-12-31 MED ORDER — METOCLOPRAMIDE HCL 5 MG/ML IJ SOLN
10.0000 mg | Freq: Once | INTRAMUSCULAR | Status: AC
  Administered 2011-12-31: 10 mg via INTRAVENOUS
  Filled 2011-12-31 (×2): qty 2

## 2011-12-31 MED ORDER — CHLORPROMAZINE HCL 25 MG PO TABS
25.0000 mg | ORAL_TABLET | Freq: Once | ORAL | Status: AC
  Administered 2011-12-31: 25 mg via ORAL
  Filled 2011-12-31: qty 1

## 2011-12-31 MED ORDER — POTASSIUM CHLORIDE 10 MEQ/100ML IV SOLN
10.0000 meq | INTRAVENOUS | Status: AC
  Administered 2011-12-31 (×2): 10 meq via INTRAVENOUS
  Filled 2011-12-31 (×2): qty 100

## 2011-12-31 MED ORDER — SODIUM CHLORIDE 0.9 % IV SOLN
1000.0000 mL | Freq: Once | INTRAVENOUS | Status: AC
  Administered 2011-12-31: 1000 mL via INTRAVENOUS

## 2011-12-31 MED ORDER — SODIUM CHLORIDE 0.9 % IV BOLUS (SEPSIS)
1000.0000 mL | Freq: Once | INTRAVENOUS | Status: AC
  Administered 2011-12-31: 1000 mL via INTRAVENOUS

## 2011-12-31 MED ORDER — POTASSIUM CHLORIDE CRYS ER 20 MEQ PO TBCR: 40.0000 meq | EXTENDED_RELEASE_TABLET | Freq: Once | ORAL | Status: DC

## 2011-12-31 MED ORDER — ONDANSETRON HCL 4 MG/2ML IJ SOLN
4.0000 mg | Freq: Once | INTRAMUSCULAR | Status: AC
  Administered 2011-12-31: 4 mg via INTRAVENOUS
  Filled 2011-12-31: qty 2

## 2011-12-31 MED ORDER — SODIUM CHLORIDE 0.9 % IV SOLN: 1000.0000 mL | INTRAVENOUS | Status: DC

## 2011-12-31 MED ORDER — DIPHENHYDRAMINE HCL 50 MG/ML IJ SOLN
25.0000 mg | Freq: Once | INTRAMUSCULAR | Status: AC
  Administered 2011-12-31: 25 mg via INTRAVENOUS
  Filled 2011-12-31: qty 1

## 2011-12-31 MED ORDER — LORAZEPAM 2 MG/ML IJ SOLN
1.0000 mg | Freq: Once | INTRAMUSCULAR | Status: AC
  Administered 2011-12-31: 1 mg via INTRAVENOUS
  Filled 2011-12-31: qty 1

## 2011-12-31 MED ORDER — SODIUM CHLORIDE 0.9 % IV SOLN
25.0000 mg | Freq: Once | INTRAVENOUS | Status: AC
  Administered 2011-12-31: 25 mg via INTRAVENOUS
  Filled 2011-12-31: qty 1

## 2011-12-31 NOTE — ED Notes (Signed)
Knapp MD states to hold PO K+ until nausea/vomiting subsides

## 2011-12-31 NOTE — ED Provider Notes (Signed)
History     CSN: [id number]  Arrival date & time 12/31/11  1327   First MD Initiated Contact with Patient 12/31/11 1427      Chief Complaint  Patient presents with  . nausea and vomiting    . Hiccups  . Abdominal Pain    (Consider location/radiation/quality/duration/timing/severity/associated sxs/prior treatment) HPI  Patient reports he has had multiple myeloma for a few years. He relates he was recently started on a new chemotherapy regimen and got his fourth episode of Velcade 2 days ago. He reports about 10-12 hours later he started having intractable hiccuping. He states this is the third time he has had hiccups after getting this medication. However this time he also started having vomiting. He states yesterday he vomited at least once or twice every hour and today he's had about 4-5 episodes of vomiting. He states he also has some nausea and vomiting that is not related to the hiccuping. He denies abdominal pain or diarrhea. He states when he vomits he gets a burning in his chest but not in his throat. He denies chest pain or shortness of breath. He reports his been trying to drink boost or any type of fluids however he vomited shortly afterwards.  PCP Dr. Harriet Pho at [facility] and [facility] Oncologist Dr. Shirline Frees  Past Medical History  Diagnosis Date  . Multiple myeloma(203.0)   . Diabetes mellitus   . High cholesterol   . Diabetes mellitus 07/03/2011  . Hypertension 07/03/2011  . Hyperlipidemia 07/03/2011    Past Surgical History  Procedure Date  . Limbal stem cell transplant     No family history on file.  History  Substance Use Topics  . Smoking status: Never Smoker   . Smokeless tobacco: Not on file  . Alcohol Use: No   Retired from [employer] and [employer] Lives at home Lives with spouse  Review of Systems  All other systems reviewed and are negative.    Allergies  Review of patient's allergies indicates no known allergies.  Home  Medications   Current Outpatient Rx  Name  Route  Sig  Dispense  Refill  . BORTEZOMIB CHEMO IV INJECTION 3.5 MG   Intravenous   Inject 1.4 mg into the vein once.         . CHLORPROMAZINE HCL 10 MG PO TABS   Oral   Take 10 mg by mouth 2 (two) times daily as needed.         [name] DEXAMETHASONE 4 MG PO TABS      TAKE 10 TABLETS BY MOUTH ONCE WEEKLY BEFORE CHEMO   40 tablet   0   . DOXORUBICIN HCL 50 MG IV SOLR   Intravenous   Inject 30 mg into the vein once.         [name] EZETIMIBE 10 MG PO TABS   Oral   Take 10 mg by mouth daily.           [name] HAEMOPHILUS B POLYSAC CONJ VAC IM SOLR   Intramuscular   Inject 0.5 mLs into the muscle once.   1 each   0     Give 0.5 ml IM once   . LISINOPRIL 10 MG PO TABS   Oral   Take 10 mg by mouth daily.           [name] METFORMIN HCL 500 MG PO TABS   Oral   Take 500 mg by mouth daily with breakfast.          .  ONDANSETRON HCL 8 MG PO TABS   Oral   Take 1 tablet (8 mg total) by mouth every 8 (eight) hours as needed for nausea.   30 tablet   0   . PANTOPRAZOLE SODIUM 40 MG PO TBEC   Oral   Take 1 tablet (40 mg total) by mouth daily. Take 30 minutes to 1 hour before 1st meal of the day.   30 tablet   1   . PROCHLORPERAZINE MALEATE 10 MG PO TABS   Oral   Take 1 tablet (10 mg total) by mouth every 6 (six) hours as needed.   30 tablet   0   . SIMVASTATIN 10 MG PO TABS   Oral   Take 10 mg by mouth at bedtime.           [name] VALACYCLOVIR HCL 500 MG PO TABS   Oral   Take 1 tablet (500 mg total) by mouth daily.   30 tablet   3     BP 144/76  Pulse 97  Temp 98.2 F (36.8 C) (Oral)  Resp 18  SpO2 100%  Vital signs normal    Physical Exam  Nursing note and vitals reviewed. Constitutional: He is oriented to person, place, and time.  Non-toxic appearance. He does not appear ill. No distress.       Tall thin male who standing at the bedside. Hiccuping frequently  HENT:  Head: Normocephalic and atraumatic.  Right Ear:  External ear normal.  Left Ear: External ear normal.  Nose: Nose normal. No mucosal edema or rhinorrhea.  Mouth/Throat: Mucous membranes are normal. No dental abscesses or uvula swelling.       Dry tongue  Eyes: Conjunctivae normal and EOM are normal. Pupils are equal, round, and reactive to light.  Neck: Normal range of motion and full passive range of motion without pain. Neck supple.  Cardiovascular: Normal rate, regular rhythm and normal heart sounds.  Exam reveals no gallop and no friction rub.   No murmur heard. Pulmonary/Chest: Effort normal and breath sounds normal. No respiratory distress. He has no wheezes. He has no rhonchi. He has no rales. He exhibits no tenderness and no crepitus.  Abdominal: Soft. Normal appearance and bowel sounds are normal. He exhibits no distension. There is no tenderness. There is no rebound and no guarding.  Musculoskeletal: Normal range of motion. He exhibits no edema and no tenderness.       Moves all extremities well.   Neurological: He is alert and oriented to person, place, and time. He has normal strength. No cranial nerve deficit.  Skin: Skin is warm, dry and intact. No rash noted. No erythema. No pallor.  Psychiatric: His speech is normal and behavior is normal. His mood appears not anxious.        Flat affect     ED Course  Procedures (including critical care time)   Medications  0.9 %  sodium chloride infusion (0 mL Intravenous Stopped 12/31/11 1858)    Followed by  0.9 %  sodium chloride infusion (not administered)  potassium chloride SA (K-DUR,KLOR-CON) CR tablet 40 mEq (not administered)  potassium chloride 10 mEq in 100 mL IVPB (0 mEq Intravenous Stopped 12/31/11 2013)  promethazine (PHENERGAN) 25 MG suppository (not administered)  LORazepam (ATIVAN) 1 MG tablet (not administered)  0.9 %  sodium chloride infusion (0 mL Intravenous Stopped 12/31/11 1626)  ondansetron (ZOFRAN) injection 4 mg (4 mg Intravenous Given 12/31/11 1418)   metoCLOPramide (REGLAN) injection 10 mg (10  mg Intravenous Given 12/31/11 1630)  diphenhydrAMINE (BENADRYL) injection 25 mg (25 mg Intravenous Given 12/31/11 1627)  chlorproMAZINE (THORAZINE) 25 mg in sodium chloride 0.9 % 25 mL IVPB (25 mg Intravenous Given 12/31/11 1628)  sodium chloride 0.9 % bolus 1,000 mL (1000 mL Intravenous New Bag/Given 12/31/11 1858)  sodium chloride 0.9 % bolus 1,000 mL (0 mL Intravenous Stopped 12/31/11 2032)  chlorproMAZINE (THORAZINE) tablet 25 mg (25 mg Oral Given 12/31/11 1932)  LORazepam (ATIVAN) injection 1 mg (1 mg Intravenous Given 12/31/11 1804)   16:10 discussed his potassium was low, will start replacement. Pt sitting with head over trash can b/o nausea.   17:10 I walked into the room and pt was hiccupping and still feels nauseated. Hasn't had any more urine output.  More IV fluids ordered.    20:00 Pt has been sleeping, has finished his KCL 10 meq runs x 3. His hiccups are gone, is getting his 3rd liter of NS, no urine output yet.   21:00 sleeping, no hiccuping. Getting his 3rd liter of NS.   Results for orders placed during the hospital encounter of 12/31/11  CBC WITH DIFFERENTIAL      Component Value Range   WBC 6.0  4.0 - 10.5 K/uL   RBC 4.29  4.22 - 5.81 MIL/uL   Hemoglobin 10.3 (*) 13.0 - 17.0 g/dL   HCT 16.1 (*) 09.6 - 04.5 %   MCV 72.5 (*) 78.0 - 100.0 fL   MCH 24.0 (*) 26.0 - 34.0 pg   MCHC 33.1  30.0 - 36.0 g/dL   RDW 40.9 (*) 81.1 - 91.4 %   Platelets 192  150 - 400 K/uL   Neutrophils Relative 70  43 - 77 %   Neutro Abs 4.2  1.7 - 7.7 K/uL   Lymphocytes Relative 14  12 - 46 %   Lymphs Abs 0.8  0.7 - 4.0 K/uL   Monocytes Relative 16 (*) 3 - 12 %   Monocytes Absolute 1.0  0.1 - 1.0 K/uL   Eosinophils Relative 0  0 - 5 %   Eosinophils Absolute 0.0  0.0 - 0.7 K/uL   Basophils Relative 0  0 - 1 %   Basophils Absolute 0.0  0.0 - 0.1 K/uL   Smear Review PLATELET COUNT CONFIRMED BY SMEAR    COMPREHENSIVE METABOLIC PANEL       Component Value Range   Sodium 136  135 - 145 mEq/L   Potassium 2.8 (*) 3.5 - 5.1 mEq/L   Chloride 95 (*) 96 - 112 mEq/L   CO2 26  19 - 32 mEq/L   Glucose, Bld 208 (*) 70 - 99 mg/dL   BUN 12  6 - 23 mg/dL   Creatinine, Ser 7.82  0.50 - 1.35 mg/dL   Calcium 9.3  8.4 - 95.6 mg/dL   Total Protein 6.9  6.0 - 8.3 g/dL   Albumin 3.7  3.5 - 5.2 g/dL   AST 16  0 - 37 U/L   ALT 16  0 - 53 U/L   Alkaline Phosphatase 102  39 - 117 U/L   Total Bilirubin 1.2  0.3 - 1.2 mg/dL   GFR calc non Af Amer 88 (*) >90 mL/min   GFR calc Af Amer >90  >90 mL/min  LIPASE, BLOOD      Component Value Range   Lipase 38  11 - 59 U/L  URINALYSIS, MICROSCOPIC ONLY      Component Value Range   Color, Urine YELLOW  YELLOW  APPearance CLEAR  CLEAR   Specific Gravity, Urine 1.021  1.005 - 1.030   pH 8.5 (*) 5.0 - 8.0   Glucose, UA 100 (*) NEGATIVE mg/dL   Hgb urine dipstick NEGATIVE  NEGATIVE   Bilirubin Urine NEGATIVE  NEGATIVE   Ketones, ur 15 (*) NEGATIVE mg/dL   Protein, ur 30 (*) NEGATIVE mg/dL   Urobilinogen, UA 0.2  0.0 - 1.0 mg/dL   Nitrite NEGATIVE  NEGATIVE   Leukocytes, UA NEGATIVE  NEGATIVE   Bacteria, UA FEW (*) RARE   Squamous Epithelial / LPF FEW (*) RARE   Urine-Other MUCOUS PRESENT     Laboratory interpretation all normal except hypokalemia, hyperglycemia, minimally his low chloride, anemia   1. Intractable hiccups   2. Nausea and vomiting   3. Dehydration   4. Hypokalemia     New Prescriptions   LORAZEPAM (ATIVAN) 1 MG TABLET    Take 1 tablet (1 mg total) by mouth every 8 (eight) hours as needed (hiccups).   PROMETHAZINE (PHENERGAN) 25 MG SUPPOSITORY    Place 1 suppository (25 mg total) rectally every 6 (six) hours as needed for nausea (or hiccups).    Plan discharge   Devoria Albe, MD, FACEP   MDM          Ward Givens, MD 12/31/11 2111

## 2011-12-31 NOTE — ED Notes (Addendum)
Pt presenting to ed with c/o nausea and vomiting. Pt denies abdominal pain at this time. Pt states he has hiccups since Tuesday morning. Pt states he had an infusion on Monday and he noticed symptoms 10-12 hours after infusion. Pt states he was told to present to ed today by his doctor. Pt denies chest pain and abdominal pain at this time. Pt states mild abdominal pain with vomiting

## 2011-12-31 NOTE — ED Notes (Signed)
Gave patient a urinal but he stated that he could not use the restroom at this time.

## 2011-12-31 NOTE — ED Notes (Signed)
Patient has hiccups

## 2012-01-01 ENCOUNTER — Other Ambulatory Visit (HOSPITAL_BASED_OUTPATIENT_CLINIC_OR_DEPARTMENT_OTHER): Payer: Medicare Other | Admitting: Lab

## 2012-01-01 ENCOUNTER — Other Ambulatory Visit: Payer: Self-pay | Admitting: Medical Oncology

## 2012-01-01 ENCOUNTER — Ambulatory Visit (HOSPITAL_BASED_OUTPATIENT_CLINIC_OR_DEPARTMENT_OTHER): Payer: Medicare Other

## 2012-01-01 VITALS — BP 122/68 | HR 89 | Temp 98.4°F | Resp 18

## 2012-01-01 DIAGNOSIS — C9000 Multiple myeloma not having achieved remission: Secondary | ICD-10-CM

## 2012-01-01 DIAGNOSIS — I878 Other specified disorders of veins: Secondary | ICD-10-CM

## 2012-01-01 DIAGNOSIS — Z5112 Encounter for antineoplastic immunotherapy: Secondary | ICD-10-CM

## 2012-01-01 DIAGNOSIS — Z5111 Encounter for antineoplastic chemotherapy: Secondary | ICD-10-CM

## 2012-01-01 LAB — COMPREHENSIVE METABOLIC PANEL (CC13)
ALT: 12 U/L (ref 0–55)
AST: 12 U/L (ref 5–34)
Albumin: 2.8 g/dL — ABNORMAL LOW (ref 3.5–5.0)
Calcium: 7.6 mg/dL — ABNORMAL LOW (ref 8.4–10.4)
Chloride: 113 mEq/L — ABNORMAL HIGH (ref 98–107)
Potassium: 3.4 mEq/L — ABNORMAL LOW (ref 3.5–5.1)

## 2012-01-01 LAB — CBC WITH DIFFERENTIAL/PLATELET
BASO%: 0.2 % (ref 0.0–2.0)
HCT: 26.3 % — ABNORMAL LOW (ref 38.4–49.9)
MCHC: 31.9 g/dL — ABNORMAL LOW (ref 32.0–36.0)
MONO#: 0.8 10*3/uL (ref 0.1–0.9)
NEUT#: 2.6 10*3/uL (ref 1.5–6.5)
NEUT%: 64.1 % (ref 39.0–75.0)
RBC: 3.52 10*6/uL — ABNORMAL LOW (ref 4.20–5.82)
WBC: 4.1 10*3/uL (ref 4.0–10.3)
lymph#: 0.7 10*3/uL — ABNORMAL LOW (ref 0.9–3.3)
nRBC: 0 % (ref 0–0)

## 2012-01-01 MED ORDER — SODIUM CHLORIDE 0.9 % IV SOLN
Freq: Once | INTRAVENOUS | Status: AC
  Administered 2012-01-01: 09:00:00 via INTRAVENOUS

## 2012-01-01 MED ORDER — ONDANSETRON 16 MG/50ML IVPB (CHCC)
16.0000 mg | Freq: Once | INTRAVENOUS | Status: AC
  Administered 2012-01-01: 16 mg via INTRAVENOUS

## 2012-01-01 MED ORDER — DOXORUBICIN HCL LIPOSOMAL CHEMO INJECTION 2 MG/ML
30.0000 mg/m2 | Freq: Once | INTRAVENOUS | Status: AC
  Administered 2012-01-01: 60 mg via INTRAVENOUS
  Filled 2012-01-01: qty 30

## 2012-01-01 MED ORDER — BORTEZOMIB CHEMO SQ INJECTION 3.5 MG (2.5MG/ML)
1.3000 mg/m2 | Freq: Once | INTRAMUSCULAR | Status: AC
  Administered 2012-01-01: 2.5 mg via SUBCUTANEOUS
  Filled 2012-01-01: qty 2.5

## 2012-01-01 MED ORDER — LIDOCAINE-PRILOCAINE 2.5-2.5 % EX CREA: TOPICAL_CREAM | CUTANEOUS | Status: DC | PRN

## 2012-01-01 NOTE — Patient Instructions (Addendum)
[facility] Discharge Instructions for Patients Receiving Chemotherapy  Today you received the following chemotherapy agents Velcade/Doxil To help prevent nausea and vomiting after your treatment, we encourage you to take your nausea medication as directed.   If you develop nausea and vomiting that is not controlled by your nausea medication, call the clinic. If it is after clinic hours your family physician or the after hours number for the clinic or go to the Emergency Department.   BELOW ARE SYMPTOMS THAT SHOULD BE REPORTED IMMEDIATELY:  *FEVER GREATER THAN 100.5 F  *CHILLS WITH OR WITHOUT FEVER  NAUSEA AND VOMITING THAT IS NOT CONTROLLED WITH YOUR NAUSEA MEDICATION  *UNUSUAL SHORTNESS OF BREATH  *UNUSUAL BRUISING OR BLEEDING  TENDERNESS IN MOUTH AND THROAT WITH OR WITHOUT PRESENCE OF ULCERS  *URINARY PROBLEMS  *BOWEL PROBLEMS  UNUSUAL RASH Items with * indicate a potential emergency and should be followed up as soon as possible.  One of the nurses will contact you 24 hours after your treatment. Please let the nurse know about any problems that you may have experienced. Feel free to call the clinic you have any questions or concerns. The clinic phone number is [phone].   I have been informed and understand all the instructions given to me. I know to contact the clinic, my physician, or go to the Emergency Department if any problems should occur. I do not have any questions at this time, but understand that I may call the clinic during office hours   should I have any questions or need assistance in obtaining follow up care.    __________________________________________  _____________  __________ Signature of Patient or Authorized Representative            Date                   Time    __________________________________________ Nurse's Signature

## 2012-01-01 NOTE — Progress Notes (Signed)
Pt requests port -order sent to Dr Mohamed-Called in rx for emla cream

## 2012-01-01 NOTE — Progress Notes (Signed)
Pt to receive zometa at 12/16 treatment

## 2012-01-01 NOTE — Telephone Encounter (Signed)
error 

## 2012-01-05 ENCOUNTER — Other Ambulatory Visit: Payer: Medicare Other | Admitting: Lab

## 2012-01-05 ENCOUNTER — Ambulatory Visit (HOSPITAL_COMMUNITY)
Admission: RE | Admit: 2012-01-05 | Discharge: 2012-01-05 | Disposition: A | Discharge status: Home / Self Care | Payer: Medicare Other | Source: Ambulatory Visit | Attending: Family | Admitting: Family

## 2012-01-05 ENCOUNTER — Telehealth: Payer: Self-pay | Admitting: Internal Medicine

## 2012-01-05 ENCOUNTER — Telehealth: Payer: Self-pay | Admitting: Family

## 2012-01-05 ENCOUNTER — Ambulatory Visit (HOSPITAL_BASED_OUTPATIENT_CLINIC_OR_DEPARTMENT_OTHER): Payer: Medicare Other | Admitting: Family

## 2012-01-05 ENCOUNTER — Encounter: Payer: Self-pay | Admitting: Family

## 2012-01-05 ENCOUNTER — Other Ambulatory Visit: Payer: Self-pay | Admitting: *Deleted

## 2012-01-05 ENCOUNTER — Other Ambulatory Visit: Payer: Self-pay | Admitting: Family

## 2012-01-05 ENCOUNTER — Ambulatory Visit (HOSPITAL_BASED_OUTPATIENT_CLINIC_OR_DEPARTMENT_OTHER): Payer: Medicare Other

## 2012-01-05 VITALS — BP 155/87 | HR 78 | Temp 97.5°F | Resp 18 | Wt 158.0 lb

## 2012-01-05 DIAGNOSIS — C9000 Multiple myeloma not having achieved remission: Secondary | ICD-10-CM

## 2012-01-05 DIAGNOSIS — E876 Hypokalemia: Secondary | ICD-10-CM

## 2012-01-05 DIAGNOSIS — K7689 Other specified diseases of liver: Secondary | ICD-10-CM | POA: Insufficient documentation

## 2012-01-05 LAB — COMPREHENSIVE METABOLIC PANEL (CC13)
Alkaline Phosphatase: 92 U/L (ref 40–150)
Glucose: 161 mg/dl — ABNORMAL HIGH (ref 70–99)
Sodium: 141 mEq/L (ref 136–145)
Total Bilirubin: 1.49 mg/dL — ABNORMAL HIGH (ref 0.20–1.20)
Total Protein: 5.8 g/dL — ABNORMAL LOW (ref 6.4–8.3)

## 2012-01-05 LAB — CBC WITH DIFFERENTIAL/PLATELET
EOS%: 0.4 % (ref 0.0–7.0)
MCH: 24 pg — ABNORMAL LOW (ref 27.2–33.4)
MCV: 75.3 fL — ABNORMAL LOW (ref 79.3–98.0)
MONO%: 20.4 % — ABNORMAL HIGH (ref 0.0–14.0)
RBC: 3.92 10*6/uL — ABNORMAL LOW (ref 4.20–5.82)
RDW: 16.1 % — ABNORMAL HIGH (ref 11.0–14.6)
nRBC: 0 % (ref 0–0)

## 2012-01-05 LAB — TECHNOLOGIST REVIEW

## 2012-01-05 LAB — LACTATE DEHYDROGENASE (CC13): LDH: 229 U/L (ref 125–245)

## 2012-01-05 MED ORDER — CHLORPROMAZINE HCL 25 MG PO TABS: 25.0000 mg | ORAL_TABLET | Freq: Three times a day (TID) | ORAL | Status: DC | PRN

## 2012-01-05 MED ORDER — BORTEZOMIB CHEMO SQ INJECTION 3.5 MG (2.5MG/ML)
1.0000 mg/m2 | Freq: Once | INTRAMUSCULAR | Status: DC
  Filled 2012-01-05: qty 0.8

## 2012-01-05 MED ORDER — ONDANSETRON HCL 8 MG PO TABS
8.0000 mg | ORAL_TABLET | Freq: Once | ORAL | Status: AC
  Administered 2012-01-05: 8 mg via ORAL

## 2012-01-05 MED ORDER — SODIUM CHLORIDE 0.9 % IV SOLN
Freq: Once | INTRAVENOUS | Status: AC
  Administered 2012-01-05: 09:00:00 via INTRAVENOUS

## 2012-01-05 MED ORDER — HEPARIN SOD (PORK) LOCK FLUSH 100 UNIT/ML IV SOLN
500.0000 [IU] | Freq: Once | INTRAVENOUS | Status: DC | PRN
  Filled 2012-01-05: qty 5

## 2012-01-05 MED ORDER — POTASSIUM CHLORIDE CRYS ER 20 MEQ PO TBCR: 20.0000 meq | EXTENDED_RELEASE_TABLET | Freq: Every day | ORAL | Status: DC

## 2012-01-05 MED ORDER — ALTEPLASE 2 MG IJ SOLR
2.0000 mg | Freq: Once | INTRAMUSCULAR | Status: DC | PRN
  Filled 2012-01-05: qty 2

## 2012-01-05 MED ORDER — ZOLEDRONIC ACID 4 MG/100ML IV SOLN
4.0000 mg | Freq: Once | INTRAVENOUS | Status: AC
  Administered 2012-01-05: 4 mg via INTRAVENOUS
  Filled 2012-01-05: qty 100

## 2012-01-05 MED ORDER — ZOLEDRONIC ACID 4 MG/5ML IV CONC: 4.0000 mg | Freq: Once | INTRAVENOUS | Status: DC

## 2012-01-05 MED ORDER — HEPARIN SOD (PORK) LOCK FLUSH 100 UNIT/ML IV SOLN
250.0000 [IU] | Freq: Once | INTRAVENOUS | Status: DC | PRN
  Filled 2012-01-05: qty 5

## 2012-01-05 MED ORDER — SODIUM CHLORIDE 0.9 % IJ SOLN
3.0000 mL | Freq: Once | INTRAMUSCULAR | Status: DC | PRN
  Filled 2012-01-05: qty 10

## 2012-01-05 MED ORDER — SODIUM CHLORIDE 0.9 % IJ SOLN
10.0000 mL | INTRAMUSCULAR | Status: DC | PRN
  Filled 2012-01-05: qty 10

## 2012-01-05 MED ORDER — DEXAMETHASONE 4 MG PO TABS: 40.0000 mg | ORAL_TABLET | ORAL | Status: DC

## 2012-01-05 MED ORDER — BORTEZOMIB CHEMO SQ INJECTION 3.5 MG (2.5MG/ML): 1.3000 mg/m2 | Freq: Once | INTRAMUSCULAR | Status: DC

## 2012-01-05 NOTE — Progress Notes (Signed)
Patient ID: LEGACY LACIVITA, male   DOB: [birth year], 66 y.o.   MRN: [id number] CSN: [id number]  Cc: [name], PA-C   Identifying Statement: Eddie Thomas is a 66 y.o. African-American male with a history of multiple myeloma, IgA subtype who presents with acute nausea, vomiting and intractable hiccups.   Prior Therapy: 1. Status post 6 cycles of systemic chemotherapy with Revlimid and Decadron, last dose was given 07/21/2010 with very good response. 2. 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 Lance Bosch.  3. maintenance Revlimid at 10 mg by mouth daily status post 2 months. Therapy began 01/18/2011. 4. maintenance Revlimid at 15 mg by mouth daily with prophylactic dose Coumadin at 2 mg by mouth daily  Current Therapy:  1. Zometa 4 mg IV every 4 weeks.  2. Systemic chemotherapy with Velcade at 1.3 mg per meter squared given on days 1, 4, 8 and 11 and Doxil at 30 mg per meter square given on day 4 and Decadron 40 mg by mouth on weekly basis given every 3 weeks. Status post 2 cycles  Interval History: Eddie Thomas 66 year-old African-American male comes to the clinic today because he was complaining of nausea/vomiting/hiccups for the past week.  Eddie Thomas states that he presented to the ER on 12/31/2011 complaining of the same.  He was given prescriptions from the ER for Lorazepam and Phenergan suppositories, but he has not used the suppositories.  He presents to the clinic today for a scheduled Zometa infusion and Velcade injection.  Upon his arrival to the infusion room, the patient complains of N/V (without diarrhea) and intractable hiccups x 1 week.  Eddie Thomas states that he has had one episode of emesis today and his episodes of emesis have slowed considerably.  It was decided by Tiana Loft, PA-C to hold Velcade treatment today, but the patient did receive the Zometa IV infusion. Eddie Thomas states that  he gets the hiccups when he takes Decadron, and he took a dose of Decadron today.  He also states that eating exacerbates his symptoms.  Eddie Thomas has lost another 2 pounds since his last office visit and states that he has not been able to eat with his symptoms.  He denies any other symptomatology and states that the numbness in his feet has improved.     Medications: Current Outpatient Prescriptions  Medication Sig Dispense Refill  . bortezomib IV (VELCADE) 3.5 MG injection Inject 1.4 mg into the vein once.      . chlorproMAZINE (THORAZINE) 25 MG tablet Take 1 tablet (25 mg total) by mouth 3 (three) times daily as needed.  45 tablet  1  . dexamethasone (DECADRON) 4 MG tablet TAKE 10 TABLETS BY MOUTH ONCE WEEKLY BEFORE CHEMO  40 tablet  0  . DOXOrubicin (ADRIAMYCIN) 50 MG chemo injection Inject 30 mg into the vein once.      . ezetimibe (ZETIA) 10 MG tablet Take 10 mg by mouth daily.        . haemophilus B polysaccharide conjugate vaccine (ACTHIB) injection Inject 0.5 mLs into the muscle once.  1 each  0  . lidocaine-prilocaine (EMLA) cream Apply topically as needed.  30 g  0  . lisinopril (PRINIVIL,ZESTRIL) 10 MG tablet Take 10 mg by mouth daily.        [name] LORazepam (ATIVAN) 1 MG tablet Take 1 tablet (1 mg total) by mouth every 8 (eight) hours as  needed (hiccups).  10 tablet  0  . metFORMIN (GLUCOPHAGE) 500 MG tablet Take 500 mg by mouth daily with breakfast.       . ondansetron (ZOFRAN) 8 MG tablet Take 1 tablet (8 mg total) by mouth every 8 (eight) hours as needed for nausea.  30 tablet  0  . pantoprazole (PROTONIX) 40 MG tablet Take 1 tablet (40 mg total) by mouth daily. Take 30 minutes to 1 hour before 1st meal of the day.  30 tablet  1  . prochlorperazine (COMPAZINE) 10 MG tablet Take 1 tablet (10 mg total) by mouth every 6 (six) hours as needed.  30 tablet  0  . promethazine (PHENERGAN) 25 MG suppository Place 1 suppository (25 mg total) rectally every 6 (six) hours as needed for nausea  (or hiccups).  10 each  0  . simvastatin (ZOCOR) 10 MG tablet Take 10 mg by mouth at bedtime.        . valACYclovir (VALTREX) 500 MG tablet Take 1 tablet (500 mg total) by mouth daily.  30 tablet  3  . potassium chloride SA (K-DUR,KLOR-CON) 20 MEQ tablet Take 1 tablet (20 mEq total) by mouth daily.  7 tablet  0    Allergies: No Known Allergies  Past Medical History: Past Medical History  Diagnosis Date  . Multiple myeloma(203.0)   . Diabetes mellitus   . High cholesterol   . Diabetes mellitus 07/03/2011  . Hypertension 07/03/2011  . Hyperlipidemia 07/03/2011     Family History: Family History  Problem Relation Age of Onset  . Diabetes Mother   . Hyperlipidemia Mother   . Cancer Mother   . Diabetes Sister     Social History: History  Substance Use Topics  . Smoking status: Never Smoker   . Smokeless tobacco: Never Used  . Alcohol Use: No    Review of Systems: 10 point review of systems was completed and is negative except as noted above.   Physical Exam: Blood pressure 155/87, pulse 78, temperature 97.72F and (36.1C), temperature source oral, respiration rate 18, height 6 '1" (1.854 m), weight 158 lb (71.668 kg)  General appearance: Alert, cooperative, thin frame, mild distress Head: Normocephalic, without obvious abnormality, atraumatic Eyes: Conjunctivae/corneas clear, PERRLA, EOMI Nose: Nares, septum and mucosa are normal, no drainage or sinus tenderness Neck: No adenopathy, supple, symmetrical, trachea midline, thyroid not enlarged, no tenderness Resp: Clear to auscultation bilaterally Cardio: Regular rate and rhythm, S1, S2 normal, no murmur, click, rub or gallop GI: Soft, non-tender, hypoactive bowel sounds, no organomegaly, hiccups Extremities: Extremities normal, atraumatic, no cyanosis or edema Lymph nodes: Cervical, supraclavicular, and axillary nodes normal Neurologic: Grossly normal   Laboratory Data: Results for orders placed in visit on 01/01/12  (from the past 48 hour(s))  CBC WITH DIFFERENTIAL     Status: Abnormal   Collection Time   01/05/12  8:29 AM      Component Value Range Comment   WBC 2.8 (*) 4.0 - 10.3 10e3/uL    NEUT# 1.7  1.5 - 6.5 10e3/uL    HGB 9.4 (*) 13.0 - 17.1 g/dL    HCT 11.9 (*) 14.7 - 49.9 %    Platelets 100 Large & giant platelets (*) 140 - 400 10e3/uL    MCV 75.3 (*) 79.3 - 98.0 fL    MCH 24.0 (*) 27.2 - 33.4 pg    MCHC 31.9 (*) 32.0 - 36.0 g/dL    RBC 8.29 (*) 5.62 - 5.82 10e6/uL    RDW 16.1 (*)  11.0 - 14.6 %    lymph# 0.6 (*) 0.9 - 3.3 10e3/uL    MONO# 0.6  0.1 - 0.9 10e3/uL    Eosinophils Absolute 0.0  0.0 - 0.5 10e3/uL    Basophils Absolute 0.0  0.0 - 0.1 10e3/uL    NEUT% 58.1  39.0 - 75.0 %    LYMPH% 21.1  14.0 - 49.0 %    MONO% 20.4 (*) 0.0 - 14.0 %    EOS% 0.4  0.0 - 7.0 %    BASO% 0.0  0.0 - 2.0 %    nRBC 0  0 - 0 %   TECHNOLOGIST REVIEW     Status: Normal   Collection Time   01/05/12  8:29 AM      Component Value Range Comment   Technologist Review mod ovalos, teardrops and rbc fragments     COMPREHENSIVE METABOLIC PANEL (CC13)     Status: Abnormal   Collection Time   01/05/12  8:31 AM      Component Value Range Comment   Sodium 141  136 - 145 mEq/L    Potassium 3.2 (*) 3.5 - 5.1 mEq/L    Chloride 101  98 - 107 mEq/L    CO2 32 (*) 22 - 29 mEq/L    Glucose 161 (*) 70 - 99 mg/dl    BUN 7.0  7.0 - 16.1 mg/dL    Creatinine 0.8  0.7 - 1.3 mg/dL    Total Bilirubin 0.96 (*) 0.20 - 1.20 mg/dL    Alkaline Phosphatase 92  40 - 150 U/L    AST 17  5 - 34 U/L    ALT 19  0 - 55 U/L    Total Protein 5.8 (*) 6.4 - 8.3 g/dL    Albumin 3.1 (*) 3.5 - 5.0 g/dL    Calcium 8.9  8.4 - 04.5 mg/dL   LACTATE DEHYDROGENASE (CC13)     Status: Normal   Collection Time   01/05/12  8:31 AM      Component Value Range Comment   LDH 229  125 - 245 U/L 11/27/11 - NOTE new reference range.   Imaging Studies: US Abdomen Complete 01/05/2012  *RADIOLOGY REPORT*  Clinical Data:  Elevated bilirubin.  Height of  the leukemia. Multiple myeloma.  COMPLETE ABDOMINAL ULTRASOUND  Comparison:  None available.  Findings:  Gallbladder:  Minimal sludge is present within the gallbladder. There are no stones.  There is no wall thickening.  No sonographic Murphy's sign is reported.  Common bile duct:  Normal in caliber. No biliary ductal dilation. The maximal diameter is 4.1 mm, within normal limits.  Liver:  Multiple small cysts are present.  The largest measures 8 mm.  A hyperechoic lesion within the left lower lobe measures 9 mm, compatible with a hemangioma.  IVC:  Appears normal.  Pancreas:  No focal abnormality seen.  Spleen:  Normal size and echotexture without focal parenchymal abnormality.  The maximal length is 5.8 cm.  Right Kidney:  No hydronephrosis.  Well-preserved cortex.  Normal size and parenchymal echotexture without focal abnormalities. The maximal length is 11.3 cm, within normal limits.  Left Kidney:  No hydronephrosis.  Well-preserved cortex.  Normal size and parenchymal echotexture without focal abnormalities. The maximal length is 11.9 cm, within normal limits.  Abdominal aorta:  No aneurysm identified.  IMPRESSION:  1.  Tiny cysts within the liver. 2.  Minimal gallbladder sludge but no evidence for cholecystitis. 3.  Probable hemangioma within the left lobe of the  liver.   Original Report Authenticated By: Marin Roberts, M.D.     Impression/Plan: Eddie Thomas is a pleasant 66 year old African-American male with history of multiple myeloma, IgA subtype diagnosed in December of 2011 status post systemic chemotherapy with Revlimid and Decadron followed by peripheral blood autologous stem cell transplant and has been on maintenance Revlimid since December of 2012.   The patient has evidence for disease progression with a worsening myeloma panel. He is now being treated with systemic chemotherapy (21 day cycle) in the form of subcutaneous Velcade 1.3 mg per meter square given on days 1, 4, 8 and 11, Doxil  30 mg per meter square given on day 4 and Decadron 40 mg by mouth on weekly basis (Mondays), given every 3 weeks.  Currently he is in cycle 3, day 8 and Velcade was held today.  He also receives Zometa every 4 weeks.  The care of Eddie Thomas was discussed with Dr. Arbutus Ped today.  He was scheduled for an abdominal US today in light of his elevated bilirubin level of 1.49 today (please refer to results listed above).  Thorazine dose was increased from 10 mg PO BID, PRN to 25 mg PO TID, PRN hiccups.  The patient was also given a prescription for potassium 20 mEq PO daily x 7 days for resulting hypokalemia from emesis episodes.    Eddie Thomas is scheduled to receive a Port-A-Cath on 01/09/2012.  He is scheduled to follow up with Dr. Arbutus Ped on 01/19/2012 with  Repeat a CBC differential, CMET, LDH, quantitative immunoglobulin, beta 2 microglobulin and serum light chains to be drawn in advance of the office visit to reevaluate his disease.   Eddie Thomas is encouraged to contact us in the interim with any questions or concerns   Larina Bras, NP-C 01/05/2012, 2:32 PM

## 2012-01-05 NOTE — Progress Notes (Signed)
Quick Note:  Call patient with the result and order K Dur 20 meq po qd X 7 ______ 

## 2012-01-05 NOTE — Telephone Encounter (Signed)
gv pt appt schedule for December 2013 and January 2014. Per pt he does not come in for lbs on his off chemo wk.

## 2012-01-05 NOTE — Patient Instructions (Addendum)
Please contact us at [phone] if you have any questions or concerns.  Please continue to consume a healthy diet Use phenergan suppositories as directed

## 2012-01-05 NOTE — Progress Notes (Signed)
velcade held today and patient to see PA jackie today.

## 2012-01-05 NOTE — Telephone Encounter (Signed)
Patient called in to say that he forgot to mention that he is out of Decadron and needs a refill - Electronic prescription was sent to the patient's pharmacy  (Decadron 40 mg PO weekly (4 mg tabs)) #40, no refills.

## 2012-01-06 ENCOUNTER — Other Ambulatory Visit: Payer: Self-pay | Admitting: Radiology

## 2012-01-07 ENCOUNTER — Telehealth: Payer: Self-pay | Admitting: *Deleted

## 2012-01-07 NOTE — Telephone Encounter (Signed)
Message copied by Caren Griffins on Wed Jan 07, 2012 10:36 AM ------      Message from: Si Gaul      Created: Mon Jan 05, 2012  7:00 PM       Call patient with the result and order K Dur 20 meq po qd X 7

## 2012-01-07 NOTE — Telephone Encounter (Signed)
Per dictation from Norina Buzzard, pt was given rx for Kdur x 7 days at office visit on 12/16.  SLJ

## 2012-01-08 ENCOUNTER — Ambulatory Visit: Payer: Medicare Other

## 2012-01-08 ENCOUNTER — Ambulatory Visit: Payer: Medicare Other | Admitting: Nutrition

## 2012-01-08 ENCOUNTER — Other Ambulatory Visit (HOSPITAL_BASED_OUTPATIENT_CLINIC_OR_DEPARTMENT_OTHER): Payer: Medicare Other

## 2012-01-08 ENCOUNTER — Other Ambulatory Visit: Payer: Self-pay

## 2012-01-08 ENCOUNTER — Other Ambulatory Visit: Payer: Self-pay | Admitting: Radiology

## 2012-01-08 DIAGNOSIS — C9000 Multiple myeloma not having achieved remission: Secondary | ICD-10-CM

## 2012-01-08 DIAGNOSIS — E876 Hypokalemia: Secondary | ICD-10-CM

## 2012-01-08 LAB — CBC WITH DIFFERENTIAL/PLATELET
BASO%: 0.1 % (ref 0.0–2.0)
HCT: 30.3 % — ABNORMAL LOW (ref 38.4–49.9)
MCHC: 32.7 g/dL (ref 32.0–36.0)
MONO#: 0.5 10*3/uL (ref 0.1–0.9)
NEUT%: 73.8 % (ref 39.0–75.0)
RBC: 3.97 10*6/uL — ABNORMAL LOW (ref 4.20–5.82)
WBC: 4 10*3/uL (ref 4.0–10.3)
lymph#: 0.5 10*3/uL — ABNORMAL LOW (ref 0.9–3.3)

## 2012-01-08 LAB — COMPREHENSIVE METABOLIC PANEL (CC13)
ALT: 14 U/L (ref 0–55)
CO2: 31 mEq/L — ABNORMAL HIGH (ref 22–29)
Calcium: 8.4 mg/dL (ref 8.4–10.4)
Chloride: 104 mEq/L (ref 98–107)
Creatinine: 0.8 mg/dL (ref 0.7–1.3)
Glucose: 160 mg/dl — ABNORMAL HIGH (ref 70–99)

## 2012-01-08 LAB — LACTATE DEHYDROGENASE (CC13): LDH: 206 U/L (ref 125–245)

## 2012-01-08 MED ORDER — CHLORPROMAZINE HCL 25 MG PO TABS: 25.0000 mg | ORAL_TABLET | Freq: Three times a day (TID) | ORAL | Status: DC | PRN

## 2012-01-08 NOTE — Progress Notes (Signed)
0945- Pt in for treatment today, states he has had nausea, despite taking all of his anti-emetics prescribed, and has vomited approx 3-4 times since yesterday.  Pt states he has not been able to keep down liquids.  Pt states he did have some bright, red bleeding one time when vomiting, but no other bleeding.  Pt states he has still had his hiccups, and his pharmacy informed him that they did not have the 25 mg thorazine prescribed on 12/16, but he did take 2 of his 10 mg thorazine tablets.  Pt also states he noticed a small swelling on his L shin last night, but it seems to have gone down since then.  This area is not painful, per pt, and no redness noted.  Informed Dr. Arbutus Ped of all these findings, and he states for pt to hold treatment today, do not take any more steroids, observe area on leg for now since swelling decreased, and call office if he is unable to get thorazine prescription.  Pt is drinking ginger ale here now and able to keep this down.  Informed pt of MD orders, and he verbalizes understanding and knows to call office if any further problems.

## 2012-01-08 NOTE — Telephone Encounter (Signed)
Misty Stanley, pharmacist, checked with [facility] outpt pharmacy, and they do have 25 mg thorazine in stock.  Per Dr. Arbutus Ped, verbal order received and verified, ok to call in prescription there.  Pt verbalizes understanding.

## 2012-01-08 NOTE — Progress Notes (Signed)
I spoke briefly with patient today in chemotherapy. He has been having hiccups all week preventing him from eating well or sleeping well. His weight was documented as 158 pounds on December 16 but he states it is probably less than that today. His Dr. has adjusted his medications with the hope of improving/resolving hiccups.  Nutrition diagnosis: Food and nutrition related knowledge deficit improved.  Intervention: I have encouraged patient to continue to consume Ensure Plus as tolerated as this seems to be easier to drink than eating food itself. Patient has plenty of oral nutrition supplements available to him.  Monitoring, evaluation, goals: Patient has been unable to consume adequate intake this week. Hopefully, his intake will increase once his hiccups have resolved.  Next visit: Monday, December 30.

## 2012-01-09 ENCOUNTER — Ambulatory Visit (HOSPITAL_COMMUNITY)
Admission: RE | Admit: 2012-01-09 | Discharge: 2012-01-09 | Disposition: A | Discharge status: Home / Self Care | Payer: Medicare Other | Source: Ambulatory Visit | Attending: Internal Medicine | Admitting: Internal Medicine

## 2012-01-09 VITALS — BP 146/85 | HR 96 | Temp 99.6°F | Resp 21

## 2012-01-09 DIAGNOSIS — E119 Type 2 diabetes mellitus without complications: Secondary | ICD-10-CM | POA: Insufficient documentation

## 2012-01-09 DIAGNOSIS — E785 Hyperlipidemia, unspecified: Secondary | ICD-10-CM | POA: Insufficient documentation

## 2012-01-09 DIAGNOSIS — I878 Other specified disorders of veins: Secondary | ICD-10-CM

## 2012-01-09 DIAGNOSIS — C9000 Multiple myeloma not having achieved remission: Secondary | ICD-10-CM | POA: Insufficient documentation

## 2012-01-09 DIAGNOSIS — I10 Essential (primary) hypertension: Secondary | ICD-10-CM | POA: Insufficient documentation

## 2012-01-09 DIAGNOSIS — Z79899 Other long term (current) drug therapy: Secondary | ICD-10-CM | POA: Insufficient documentation

## 2012-01-09 MED ORDER — CEFAZOLIN SODIUM 1-5 GM-% IV SOLN
1.0000 g | Freq: Once | INTRAVENOUS | Status: DC
  Filled 2012-01-09: qty 50

## 2012-01-09 MED ORDER — SODIUM CHLORIDE 0.9 % IV SOLN: INTRAVENOUS | Status: DC

## 2012-01-09 MED ORDER — MIDAZOLAM HCL 2 MG/2ML IJ SOLN
INTRAMUSCULAR | Status: AC
  Filled 2012-01-09: qty 6

## 2012-01-09 MED ORDER — FENTANYL CITRATE 0.05 MG/ML IJ SOLN
INTRAMUSCULAR | Status: AC
  Filled 2012-01-09: qty 6

## 2012-01-09 NOTE — H&P (Signed)
Eddie Thomas is an 66 y.o. male.   Chief Complaint: "I'm here for a port a cath" HPI: Patient with history of progressive multiple myeloma presents today for port a cath placement for chemotherapy.  Past Medical History  Diagnosis Date  . Multiple myeloma(203.0)   . Diabetes mellitus   . High cholesterol   . Diabetes mellitus 07/03/2011  . Hypertension 07/03/2011  . Hyperlipidemia 07/03/2011    Past Surgical History  Procedure Date  . Limbal stem cell transplant   . Humerus fracture surgery     Family History  Problem Relation Age of Onset  . Diabetes Mother   . Hyperlipidemia Mother   . Cancer Mother   . Diabetes Sister    Social History:  reports that he has never smoked. He has never used smokeless tobacco. He reports that he does not drink alcohol or use illicit drugs.  Allergies: No Known Allergies  Current outpatient prescriptions:bortezomib IV (VELCADE) 3.5 MG injection, Inject 1.4 mg into the vein once., Disp: , Rfl: ;  chlorproMAZINE (THORAZINE) 25 MG tablet, Take 1 tablet (25 mg total) by mouth 3 (three) times daily as needed., Disp: 45 tablet, Rfl: 1;  dexamethasone (DECADRON) 4 MG tablet, Take 10 tablets (40 mg total) by mouth once a week., Disp: 40 tablet, Rfl: 0 DOXOrubicin (ADRIAMYCIN) 50 MG chemo injection, Inject 30 mg into the vein once., Disp: , Rfl: ;  ezetimibe (ZETIA) 10 MG tablet, Take 10 mg by mouth daily.  , Disp: , Rfl: ;  haemophilus B polysaccharide conjugate vaccine (ACTHIB) injection, Inject 0.5 mLs into the muscle once., Disp: 1 each, Rfl: 0;  lidocaine-prilocaine (EMLA) cream, Apply topically as needed., Disp: 30 g, Rfl: 0 lisinopril (PRINIVIL,ZESTRIL) 10 MG tablet, Take 10 mg by mouth daily.  , Disp: , Rfl: ;  LORazepam (ATIVAN) 1 MG tablet, Take 1 tablet (1 mg total) by mouth every 8 (eight) hours as needed (hiccups)., Disp: 10 tablet, Rfl: 0;  metFORMIN (GLUCOPHAGE) 500 MG tablet, Take 500 mg by mouth daily with breakfast. , Disp: , Rfl:   ondansetron (ZOFRAN) 8 MG tablet, Take 1 tablet (8 mg total) by mouth every 8 (eight) hours as needed for nausea., Disp: 30 tablet, Rfl: 0;  pantoprazole (PROTONIX) 40 MG tablet, Take 1 tablet (40 mg total) by mouth daily. Take 30 minutes to 1 hour before 1st meal of the day., Disp: 30 tablet, Rfl: 1;  potassium chloride SA (K-DUR,KLOR-CON) 20 MEQ tablet, Take 1 tablet (20 mEq total) by mouth daily., Disp: 7 tablet, Rfl: 0 prochlorperazine (COMPAZINE) 10 MG tablet, Take 1 tablet (10 mg total) by mouth every 6 (six) hours as needed., Disp: 30 tablet, Rfl: 0;  promethazine (PHENERGAN) 25 MG suppository, Place 1 suppository (25 mg total) rectally every 6 (six) hours as needed for nausea (or hiccups)., Disp: 10 each, Rfl: 0;  simvastatin (ZOCOR) 10 MG tablet, Take 10 mg by mouth at bedtime.  , Disp: , Rfl:  valACYclovir (VALTREX) 500 MG tablet, Take 1 tablet (500 mg total) by mouth daily., Disp: 30 tablet, Rfl: 3 Current facility-administered medications:0.9 %  sodium chloride infusion, , Intravenous, Continuous, Brayton El, PA;  ceFAZolin (ANCEF) IVPB 1 g/50 mL premix, 1 g, Intravenous, Once, [employer], PA;  fentaNYL (SUBLIMAZE) 0.05 MG/ML injection, , , , ;  midazolam (VERSED) 2 MG/2ML injection, , , ,    Results for orders placed during the hospital encounter of 01/09/12 (from the past 48 hour(s))  GLUCOSE, CAPILLARY     Status:  Abnormal   Collection Time   01/09/12 12:09 PM      Component Value Range Comment   Glucose-Capillary 174 (*) 70 - 99 mg/dL    Results for orders placed during the hospital encounter of 01/09/12  GLUCOSE, CAPILLARY      Component Value Range   Glucose-Capillary 174 (*) 70 - 99 mg/dL   29/56/2130   WBC 4.0   HGB 9.9  PLTS  157K  K 3.3 Review of Systems  Constitutional: Negative for chills.       Mild temp elevation  Respiratory: Negative for cough and shortness of breath.   Cardiovascular:       Occ chest pressure from repeated hiccups  Gastrointestinal:  Positive for nausea and vomiting.  Musculoskeletal: Negative for back pain.  Neurological: Negative for headaches.    Blood pressure 146/85, pulse 96, temperature 99.6 F (37.6 C), temperature source Oral, resp. rate 21, SpO2 100.00%. Physical Exam  Constitutional: He is oriented to person, place, and time.       Thin BM with persistent hiccups  Respiratory: Effort normal and breath sounds normal.  GI: Soft. Bowel sounds are normal. There is no tenderness.  Musculoskeletal: Normal range of motion. He exhibits no edema.  Neurological: He is alert and oriented to person, place, and time.     Assessment/Plan Pt with progressive multiple myeloma. Plan is for port a cath placement today for chemotherapy. Details/risks of procedure d/w pt/wife with their understanding and consent.  ALLRED,D KEVIN 01/09/2012, 1:18 PM

## 2012-01-12 ENCOUNTER — Other Ambulatory Visit: Payer: Self-pay | Admitting: Internal Medicine

## 2012-01-12 LAB — SPEP & IFE WITH QIG
Albumin ELP: 57 % (ref 55.8–66.1)
Alpha-1-Globulin: 8.2 % — ABNORMAL HIGH (ref 2.9–4.9)
Beta 2: 11.4 % — ABNORMAL HIGH (ref 3.2–6.5)
Beta Globulin: 5.4 % (ref 4.7–7.2)

## 2012-01-12 LAB — KAPPA/LAMBDA LIGHT CHAINS
Kappa free light chain: 0.03 mg/dL — ABNORMAL LOW (ref 0.33–1.94)
Lambda Free Lght Chn: 1.99 mg/dL (ref 0.57–2.63)

## 2012-01-13 ENCOUNTER — Telehealth: Payer: Self-pay | Admitting: Medical Oncology

## 2012-01-13 DIAGNOSIS — C9000 Multiple myeloma not having achieved remission: Secondary | ICD-10-CM

## 2012-01-13 DIAGNOSIS — E876 Hypokalemia: Secondary | ICD-10-CM

## 2012-01-13 MED ORDER — POTASSIUM CHLORIDE CRYS ER 20 MEQ PO TBCR: 20.0000 meq | EXTENDED_RELEASE_TABLET | Freq: Every day | ORAL | Status: DC

## 2012-01-13 NOTE — Telephone Encounter (Signed)
Message copied by Charma Igo on Tue Jan 13, 2012  1:37 PM ------      Message from: Conni Slipper      Created: Tue Jan 13, 2012  1:26 PM       Abnormal results, please call in following prescription and notify patient   KCL 20 meq by mouth daily for 7 days

## 2012-01-13 NOTE — Telephone Encounter (Signed)
Called to [facility] and to wife who said she will tell pt to call me,

## 2012-01-13 NOTE — Telephone Encounter (Signed)
Pt notified. I called [facility] to refill zofran and kdur

## 2012-01-14 ENCOUNTER — Encounter (HOSPITAL_BASED_OUTPATIENT_CLINIC_OR_DEPARTMENT_OTHER): Payer: Self-pay | Admitting: *Deleted

## 2012-01-14 ENCOUNTER — Emergency Department (HOSPITAL_BASED_OUTPATIENT_CLINIC_OR_DEPARTMENT_OTHER)
Admission: EM | Admit: 2012-01-14 | Discharge: 2012-01-14 | Disposition: A | Discharge status: Home / Self Care | Payer: Medicare Other | Attending: Emergency Medicine | Admitting: Emergency Medicine

## 2012-01-14 DIAGNOSIS — I10 Essential (primary) hypertension: Secondary | ICD-10-CM | POA: Insufficient documentation

## 2012-01-14 DIAGNOSIS — E119 Type 2 diabetes mellitus without complications: Secondary | ICD-10-CM | POA: Insufficient documentation

## 2012-01-14 DIAGNOSIS — R42 Dizziness and giddiness: Secondary | ICD-10-CM | POA: Insufficient documentation

## 2012-01-14 DIAGNOSIS — R197 Diarrhea, unspecified: Secondary | ICD-10-CM | POA: Insufficient documentation

## 2012-01-14 DIAGNOSIS — E876 Hypokalemia: Secondary | ICD-10-CM | POA: Insufficient documentation

## 2012-01-14 DIAGNOSIS — E785 Hyperlipidemia, unspecified: Secondary | ICD-10-CM | POA: Insufficient documentation

## 2012-01-14 DIAGNOSIS — R5381 Other malaise: Secondary | ICD-10-CM | POA: Insufficient documentation

## 2012-01-14 DIAGNOSIS — R112 Nausea with vomiting, unspecified: Secondary | ICD-10-CM | POA: Insufficient documentation

## 2012-01-14 DIAGNOSIS — C9000 Multiple myeloma not having achieved remission: Secondary | ICD-10-CM | POA: Insufficient documentation

## 2012-01-14 DIAGNOSIS — R634 Abnormal weight loss: Secondary | ICD-10-CM | POA: Insufficient documentation

## 2012-01-14 DIAGNOSIS — R5383 Other fatigue: Secondary | ICD-10-CM | POA: Insufficient documentation

## 2012-01-14 DIAGNOSIS — Z79899 Other long term (current) drug therapy: Secondary | ICD-10-CM | POA: Insufficient documentation

## 2012-01-14 LAB — CBC WITH DIFFERENTIAL/PLATELET
Basophils Relative: 0 % (ref 0–1)
Eosinophils Absolute: 0 10*3/uL (ref 0.0–0.7)
Eosinophils Relative: 0 % (ref 0–5)
MCH: 24.6 pg — ABNORMAL LOW (ref 26.0–34.0)
MCHC: 34 g/dL (ref 30.0–36.0)
MCV: 72.3 fL — ABNORMAL LOW (ref 78.0–100.0)
Monocytes Relative: 10 % (ref 3–12)
Neutrophils Relative %: 79 % — ABNORMAL HIGH (ref 43–77)
Platelets: 241 10*3/uL (ref 150–400)

## 2012-01-14 LAB — COMPREHENSIVE METABOLIC PANEL
Albumin: 3.6 g/dL (ref 3.5–5.2)
Alkaline Phosphatase: 78 U/L (ref 39–117)
BUN: 7 mg/dL (ref 6–23)
Calcium: 8.9 mg/dL (ref 8.4–10.5)
GFR calc Af Amer: >90 mL/min (ref >90)
Potassium: 2.8 mEq/L — ABNORMAL LOW (ref 3.5–5.1)
Total Protein: 6.4 g/dL (ref 6.0–8.3)

## 2012-01-14 MED ORDER — SODIUM CHLORIDE 0.9 % IV BOLUS (SEPSIS)
1000.0000 mL | Freq: Once | INTRAVENOUS | Status: AC
  Administered 2012-01-14: 1000 mL via INTRAVENOUS

## 2012-01-14 MED ORDER — ONDANSETRON HCL 4 MG/2ML IJ SOLN: 4.0000 mg | Freq: Once | INTRAMUSCULAR | Status: DC

## 2012-01-14 MED ORDER — ONDANSETRON HCL 4 MG/2ML IJ SOLN
4.0000 mg | Freq: Once | INTRAMUSCULAR | Status: AC
  Administered 2012-01-14: 4 mg via INTRAVENOUS
  Filled 2012-01-14: qty 2

## 2012-01-14 MED ORDER — POTASSIUM CHLORIDE CRYS ER 20 MEQ PO TBCR
40.0000 meq | EXTENDED_RELEASE_TABLET | Freq: Once | ORAL | Status: AC
  Administered 2012-01-14: 40 meq via ORAL
  Filled 2012-01-14: qty 2

## 2012-01-14 MED ORDER — PROMETHAZINE HCL 25 MG/ML IJ SOLN
25.0000 mg | Freq: Once | INTRAMUSCULAR | Status: AC
  Administered 2012-01-14: 25 mg via INTRAVENOUS
  Filled 2012-01-14: qty 1

## 2012-01-14 MED ORDER — POTASSIUM CHLORIDE 10 MEQ/100ML IV SOLN
10.0000 meq | Freq: Once | INTRAVENOUS | Status: AC
Start: 2012-01-14 — End: 2012-01-14
  Administered 2012-01-14: 10 meq via INTRAVENOUS
  Filled 2012-01-14: qty 100

## 2012-01-14 MED ORDER — POTASSIUM CHLORIDE 10 MEQ/100ML IV SOLN
10.0000 meq | Freq: Once | INTRAVENOUS | Status: AC
  Administered 2012-01-14: 10 meq via INTRAVENOUS
  Filled 2012-01-14: qty 100

## 2012-01-14 MED ORDER — POTASSIUM CHLORIDE CRYS ER 20 MEQ PO TBCR: 20.0000 meq | EXTENDED_RELEASE_TABLET | Freq: Every day | ORAL | Status: DC

## 2012-01-14 MED ORDER — PANTOPRAZOLE SODIUM 40 MG IV SOLR
40.0000 mg | Freq: Once | INTRAVENOUS | Status: AC
  Administered 2012-01-14: 40 mg via INTRAVENOUS
  Filled 2012-01-14: qty 40

## 2012-01-14 NOTE — ED Notes (Signed)
Pt c/o N/V/D and unable to sleep for a couple of weeks but sts it became worse today.

## 2012-01-14 NOTE — ED Provider Notes (Signed)
History     CSN: [id number]  Arrival date & time 01/14/12  0451   First MD Initiated Contact with Patient 01/14/12 [phone]      Chief Complaint  Patient presents with  . Nausea and Vomiting     (Consider location/radiation/quality/duration/timing/severity/associated sxs/prior treatment) HPI This is a 66 year old male with multiple myeloma. He has had nausea and vomiting and hiccups for the past 2 weeks after his last round of Velcade and doxorubicin. His symptoms are moderate to severe, have persisted and he has not been able to eat or drink very much. He feels weak and has been lightheaded. He denies pain. He did start having some diarrhea yesterday. He has been tried on multiple medications for his nausea and his hiccups without relief. He states he has lost about 20 pounds in the past several weeks.  Past Medical History  Diagnosis Date  . Multiple myeloma(203.0)   . Diabetes mellitus   . High cholesterol   . Diabetes mellitus 07/03/2011  . Hypertension 07/03/2011  . Hyperlipidemia 07/03/2011    Past Surgical History  Procedure Date  . Limbal stem cell transplant   . Humerus fracture surgery     Family History  Problem Relation Age of Onset  . Diabetes Mother   . Hyperlipidemia Mother   . Cancer Mother   . Diabetes Sister     History  Substance Use Topics  . Smoking status: Never Smoker   . Smokeless tobacco: Never Used  . Alcohol Use: No      Review of Systems  All other systems reviewed and are negative.    Allergies  Review of patient's allergies indicates no known allergies.  Home Medications   Current Outpatient Rx  Name  Route  Sig  Dispense  Refill  . BORTEZOMIB CHEMO IV INJECTION 3.5 MG   Intravenous   Inject 1.4 mg into the vein once.         . CHLORPROMAZINE HCL 25 MG PO TABS   Oral   Take 1 tablet (25 mg total) by mouth 3 (three) times daily as needed.   45 tablet   1   . DEXAMETHASONE 4 MG PO TABS   Oral   Take 10 tablets (40 mg  total) by mouth once a week.   40 tablet   0   . DOXORUBICIN HCL 50 MG IV SOLR   Intravenous   Inject 30 mg into the vein once.         [name] EZETIMIBE 10 MG PO TABS   Oral   Take 10 mg by mouth daily.           [name] HAEMOPHILUS B POLYSAC CONJ VAC IM SOLR   Intramuscular   Inject 0.5 mLs into the muscle once.   1 each   0     Give 0.5 ml IM once   . LIDOCAINE-PRILOCAINE 2.5-2.5 % EX CREA   Topical   Apply topically as needed.   30 g   0   . LISINOPRIL 10 MG PO TABS   Oral   Take 10 mg by mouth daily.           [name] LORAZEPAM 1 MG PO TABS   Oral   Take 1 tablet (1 mg total) by mouth every 8 (eight) hours as needed (hiccups).   10 tablet   0   . METFORMIN HCL 500 MG PO TABS   Oral   Take 500 mg by mouth daily with breakfast.          .  ONDANSETRON HCL 8 MG PO TABS      TAKE ONE TABLET BY MOUTH EVERY 8 HOURS AS NEEDED FOR NAUSEA   30 tablet   0   . PANTOPRAZOLE SODIUM 40 MG PO TBEC   Oral   Take 1 tablet (40 mg total) by mouth daily. Take 30 minutes to 1 hour before 1st meal of the day.   30 tablet   1   . POTASSIUM CHLORIDE CRYS ER 20 MEQ PO TBCR   Oral   Take 1 tablet (20 mEq total) by mouth daily.   7 tablet   0     Take one tablet daily for 7 days.   [name] PROCHLORPERAZINE MALEATE 10 MG PO TABS   Oral   Take 1 tablet (10 mg total) by mouth every 6 (six) hours as needed.   30 tablet   0   . PROMETHAZINE HCL 25 MG RE SUPP   Rectal   Place 1 suppository (25 mg total) rectally every 6 (six) hours as needed for nausea (or hiccups).   10 each   0   . SIMVASTATIN 10 MG PO TABS   Oral   Take 10 mg by mouth at bedtime.           [name] VALACYCLOVIR HCL 500 MG PO TABS   Oral   Take 1 tablet (500 mg total) by mouth daily.   30 tablet   3     BP 116/84  Pulse 95  Temp 98.2 F (36.8 C) (Oral)  Resp 18  SpO2 100%  Physical Exam General: Well-developed, thin male in no acute distress; appearance consistent with age of record HENT: normocephalic,  atraumatic Eyes: pupils equal round and reactive to light; extraocular muscles intact; arcus senilis bilaterally Neck: supple Heart: regular rate and rhythm Lungs: clear to auscultation bilaterally; constant hiccuping Abdomen: soft; nondistended; nontender; no masses or hepatosplenomegaly; bowel sounds present Extremities: No deformity; full range of motion; pulses normal Neurologic: Awake, alert and oriented; motor function intact in all extremities and symmetric; no facial droop Skin: Warm and dry Psychiatric: Normal mood and affect    ED Course  Procedures (including critical care time)    MDM   Nursing notes and vitals signs, including pulse oximetry, reviewed.  Summary of this visit's results, reviewed by myself:  Labs:  Results for orders placed during the hospital encounter of 01/14/12 (from the past 24 hour(s))  CBC WITH DIFFERENTIAL     Status: Abnormal   Collection Time   01/14/12  5:13 AM      Component Value Range   WBC 5.3  4.0 - 10.5 K/uL   RBC 4.15 (*) 4.22 - 5.81 MIL/uL   Hemoglobin 10.2 (*) 13.0 - 17.0 g/dL   HCT 11.9 (*) 14.7 - 82.9 %   MCV 72.3 (*) 78.0 - 100.0 fL   MCH 24.6 (*) 26.0 - 34.0 pg   MCHC 34.0  30.0 - 36.0 g/dL   RDW 56.2 (*) 13.0 - 86.5 %   Platelets 241  150 - 400 K/uL   Neutrophils Relative 79 (*) 43 - 77 %   Neutro Abs 4.2  1.7 - 7.7 K/uL   Lymphocytes Relative 11 (*) 12 - 46 %   Lymphs Abs 0.6 (*) 0.7 - 4.0 K/uL   Monocytes Relative 10  3 - 12 %   Monocytes Absolute 0.5  0.1 - 1.0 K/uL   Eosinophils Relative 0  0 - 5 %   Eosinophils Absolute 0.0  0.0 - 0.7 K/uL   Basophils Relative 0  0 - 1 %   Basophils Absolute 0.0  0.0 - 0.1 K/uL  COMPREHENSIVE METABOLIC PANEL     Status: Abnormal   Collection Time   01/14/12  5:13 AM      Component Value Range   Sodium 139  135 - 145 mEq/L   Potassium 2.8 (*) 3.5 - 5.1 mEq/L   Chloride 98  96 - 112 mEq/L   CO2 26  19 - 32 mEq/L   Glucose, Bld 174 (*) 70 - 99 mg/dL   BUN 7  6 - 23 mg/dL    Creatinine, Ser 1.61  0.50 - 1.35 mg/dL   Calcium 8.9  8.4 - 09.6 mg/dL   Total Protein 6.4  6.0 - 8.3 g/dL   Albumin 3.6  3.5 - 5.2 g/dL   AST 16  0 - 37 U/L   ALT 10  0 - 53 U/L   Alkaline Phosphatase 78  39 - 117 U/L   Total Bilirubin 1.0  0.3 - 1.2 mg/dL   GFR calc non Af Amer >90  >90 mL/min   GFR calc Af Amer >90  >90 mL/min   6:45 AM Nausea and hiccups relieved after Phenergan 25 mg IV and Zofran 4 mg IV. We've given the patient potassium supplementation by mouth and IV. We will give him a total of 2 L of normal saline IV.  7:09 AM Patient able swallow K-Dur 20 mEq tablets without emesis. We'll plan to discharge him home after his second liter and a second run of IV potassium chloride. The patient's hypokalemia is a known problem for which he has been treated in the past.        Hanley Seamen, MD 01/14/12 [phone]

## 2012-01-16 ENCOUNTER — Encounter (HOSPITAL_COMMUNITY): Payer: Self-pay | Admitting: Emergency Medicine

## 2012-01-16 ENCOUNTER — Telehealth: Payer: Self-pay | Admitting: *Deleted

## 2012-01-16 ENCOUNTER — Emergency Department (HOSPITAL_COMMUNITY)
Admission: EM | Admit: 2012-01-16 | Discharge: 2012-01-16 | Disposition: A | Discharge status: Home / Self Care | Payer: Medicare Other | Attending: Emergency Medicine | Admitting: Emergency Medicine

## 2012-01-16 DIAGNOSIS — C9000 Multiple myeloma not having achieved remission: Secondary | ICD-10-CM | POA: Insufficient documentation

## 2012-01-16 DIAGNOSIS — R112 Nausea with vomiting, unspecified: Secondary | ICD-10-CM | POA: Insufficient documentation

## 2012-01-16 DIAGNOSIS — R066 Hiccough: Secondary | ICD-10-CM | POA: Insufficient documentation

## 2012-01-16 DIAGNOSIS — E119 Type 2 diabetes mellitus without complications: Secondary | ICD-10-CM | POA: Insufficient documentation

## 2012-01-16 DIAGNOSIS — I10 Essential (primary) hypertension: Secondary | ICD-10-CM | POA: Insufficient documentation

## 2012-01-16 DIAGNOSIS — E7800 Pure hypercholesterolemia, unspecified: Secondary | ICD-10-CM | POA: Insufficient documentation

## 2012-01-16 DIAGNOSIS — Z79899 Other long term (current) drug therapy: Secondary | ICD-10-CM | POA: Insufficient documentation

## 2012-01-16 LAB — COMPREHENSIVE METABOLIC PANEL
Albumin: 3.7 g/dL (ref 3.5–5.2)
Alkaline Phosphatase: 81 U/L (ref 39–117)
BUN: 5 mg/dL — ABNORMAL LOW (ref 6–23)
CO2: 25 mEq/L (ref 19–32)
Chloride: 101 mEq/L (ref 96–112)
Creatinine, Ser: 0.84 mg/dL (ref 0.50–1.35)
GFR calc non Af Amer: 89 mL/min — ABNORMAL LOW (ref >90)
Glucose, Bld: 120 mg/dL — ABNORMAL HIGH (ref 70–99)
Potassium: 3.7 mEq/L (ref 3.5–5.1)
Total Bilirubin: 1.2 mg/dL (ref 0.3–1.2)

## 2012-01-16 LAB — URINALYSIS, ROUTINE W REFLEX MICROSCOPIC
Glucose, UA: NEGATIVE mg/dL
Leukocytes, UA: NEGATIVE
Protein, ur: NEGATIVE mg/dL
Urobilinogen, UA: 1 mg/dL (ref 0.0–1.0)

## 2012-01-16 LAB — CBC WITH DIFFERENTIAL/PLATELET
Basophils Relative: 0 % (ref 0–1)
Eosinophils Relative: 1 % (ref 0–5)
HCT: 32.3 % — ABNORMAL LOW (ref 39.0–52.0)
Hemoglobin: 10.4 g/dL — ABNORMAL LOW (ref 13.0–17.0)
Lymphocytes Relative: 21 % (ref 12–46)
Lymphs Abs: 1.1 10*3/uL (ref 0.7–4.0)
MCV: 74.1 fL — ABNORMAL LOW (ref 78.0–100.0)
Monocytes Relative: 10 % (ref 3–12)
Neutro Abs: 3.3 10*3/uL (ref 1.7–7.7)
RBC: 4.36 MIL/uL (ref 4.22–5.81)
RDW: 16.1 % — ABNORMAL HIGH (ref 11.5–15.5)
WBC: 5 10*3/uL (ref 4.0–10.5)

## 2012-01-16 MED ORDER — DIPHENHYDRAMINE HCL 50 MG/ML IJ SOLN
25.0000 mg | Freq: Once | INTRAMUSCULAR | Status: AC
  Administered 2012-01-16: 25 mg via INTRAVENOUS

## 2012-01-16 MED ORDER — METOCLOPRAMIDE HCL 5 MG/ML IJ SOLN
10.0000 mg | Freq: Once | INTRAMUSCULAR | Status: AC
  Administered 2012-01-16: 10 mg via INTRAVENOUS
  Filled 2012-01-16: qty 2

## 2012-01-16 MED ORDER — SODIUM CHLORIDE 0.9 % IV SOLN
25.0000 mg | Freq: Once | INTRAVENOUS | Status: AC
  Administered 2012-01-16: 25 mg via INTRAVENOUS
  Filled 2012-01-16: qty 1

## 2012-01-16 MED ORDER — SODIUM CHLORIDE 0.9 % IV BOLUS (SEPSIS)
1000.0000 mL | Freq: Once | INTRAVENOUS | Status: AC
  Administered 2012-01-16: 1000 mL via INTRAVENOUS

## 2012-01-16 MED ORDER — DIPHENHYDRAMINE HCL 50 MG/ML IJ SOLN
INTRAMUSCULAR | Status: AC
  Filled 2012-01-16: qty 1

## 2012-01-16 NOTE — ED Provider Notes (Signed)
History     CSN: [id number]  Arrival date & time 01/16/12  1644   First MD Initiated Contact with Patient 01/16/12 1848      Chief Complaint  Patient presents with  . Nausea  . Emesis  . Hiccups    HPI Patient with multiple myeloma comes in with one month history of pickups associated with nausea vomiting and hypokalemia.  Has had numerous visits the emergency department and has been under numerous therapies with no improvement.  Patient is unable to sleep has difficulty holding food down.  Feels weak and has lost weight. Past Medical History  Diagnosis Date  . Multiple myeloma(203.0)   . Diabetes mellitus   . High cholesterol   . Diabetes mellitus 07/03/2011  . Hypertension 07/03/2011  . Hyperlipidemia 07/03/2011    Past Surgical History  Procedure Date  . Limbal stem cell transplant   . Humerus fracture surgery     Family History  Problem Relation Age of Onset  . Diabetes Mother   . Hyperlipidemia Mother   . Cancer Mother   . Diabetes Sister     History  Substance Use Topics  . Smoking status: Never Smoker   . Smokeless tobacco: Never Used  . Alcohol Use: No      Review of Systems All other systems reviewed and are negative Allergies  Review of patient's allergies indicates no known allergies.  Home Medications   Current Outpatient Rx  Name  Route  Sig  Dispense  Refill  . BORTEZOMIB CHEMO IV INJECTION 3.5 MG   Intravenous   Inject 1.4 mg into the vein once.         . CHLORPROMAZINE HCL 25 MG PO TABS   Oral   Take 25 mg by mouth 3 (three) times daily as needed. Hiccups         . DOXORUBICIN HCL 50 MG IV SOLR   Intravenous   Inject 30 mg into the vein once.         [name] EZETIMIBE 10 MG PO TABS   Oral   Take 10 mg by mouth daily.           [name] HAEMOPHILUS B POLYSAC CONJ VAC IM SOLR   Intramuscular   Inject 0.5 mLs into the muscle once.   1 each   0     Give 0.5 ml IM once   . LISINOPRIL 10 MG PO TABS   Oral   Take 10 mg by mouth  daily.           [name] LORAZEPAM 1 MG PO TABS   Oral   Take 1 mg by mouth every 8 (eight) hours as needed. Hiccups         . METFORMIN HCL 500 MG PO TABS   Oral   Take 500 mg by mouth daily with breakfast.          . ONDANSETRON HCL 8 MG PO TABS   Oral   Take 8 mg by mouth every 8 (eight) hours as needed. Nausea         . PANTOPRAZOLE SODIUM 40 MG PO TBEC   Oral   Take 1 tablet (40 mg total) by mouth daily. Take 30 minutes to 1 hour before 1st meal of the day.   30 tablet   1   . POTASSIUM CHLORIDE CRYS ER 20 MEQ PO TBCR   Oral   Take 20 mEq by mouth daily.         [name]  PROCHLORPERAZINE MALEATE 10 MG PO TABS   Oral   Take 10 mg by mouth every 6 (six) hours as needed. Nausea         . PROMETHAZINE HCL 25 MG RE SUPP   Rectal   Place 25 mg rectally every 6 (six) hours as needed.         [name] SIMVASTATIN 10 MG PO TABS   Oral   Take 10 mg by mouth at bedtime.           [name] VALACYCLOVIR HCL 500 MG PO TABS   Oral   Take 1 tablet (500 mg total) by mouth daily.   30 tablet   3     BP 133/81  Pulse 108  Temp 99 F (37.2 C) (Oral)  Resp 16  Ht 6\' 1"  (1.854 m)  Wt 139 lb (63.05 kg)  BMI 18.34 kg/m2  SpO2 100%  Physical Exam  Nursing note and vitals reviewed. Constitutional: He is oriented to person, place, and time. He appears well-developed and well-nourished. He appears distressed (Active hiccups).  HENT:  Head: Normocephalic and atraumatic.  Mouth/Throat: Mucous membranes are dry.  Eyes: Pupils are equal, round, and reactive to light.  Neck: Normal range of motion.  Cardiovascular: Normal rate and intact distal pulses.   Pulmonary/Chest: No respiratory distress.  Abdominal: Normal appearance. He exhibits no distension. There is no tenderness. There is no rebound.  Musculoskeletal: Normal range of motion.  Neurological: He is alert and oriented to person, place, and time. No cranial nerve deficit.  Skin: Skin is warm and dry. No rash noted.  Psychiatric:  He has a normal mood and affect. His behavior is normal.    ED Course  Procedures (including critical care time)  Labs Reviewed  CBC WITH DIFFERENTIAL - Abnormal; Notable for the following:    Hemoglobin 10.4 (*)     HCT 32.3 (*)     MCV 74.1 (*)     MCH 23.9 (*)     RDW 16.1 (*)     All other components within normal limits  COMPREHENSIVE METABOLIC PANEL - Abnormal; Notable for the following:    Glucose, Bld 120 (*)     BUN 5 (*)     GFR calc non Af Amer 89 (*)     All other components within normal limits  URINALYSIS, ROUTINE W REFLEX MICROSCOPIC - Abnormal; Notable for the following:    pH 8.5 (*)     Ketones, ur TRACE (*)     All other components within normal limits  LAB REPORT - SCANNED   No results found.  Medications  LORazepam (ATIVAN) 1 MG tablet (not administered)  lidocaine-prilocaine (EMLA) cream (not administered)  prochlorperazine (COMPAZINE) 10 MG tablet (not administered)  promethazine (PHENERGAN) 25 MG suppository (not administered)  chlorproMAZINE (THORAZINE) 25 MG tablet (not administered)  potassium chloride SA (K-DUR,KLOR-CON) 20 MEQ tablet (not administered)  ondansetron (ZOFRAN) 8 MG tablet (not administered)  sodium chloride 0.9 % bolus 1,000 mL (0 mL Intravenous Stopped 01/16/12 2017)  metoCLOPramide (REGLAN) injection 10 mg (10 mg Intravenous Given 01/16/12 1901)  chlorproMAZINE (THORAZINE) 25 mg in sodium chloride 0.9 % 25 mL IVPB (25 mg Intravenous Given 01/16/12 2030)  diphenhydrAMINE (BENADRYL) injection 25 mg (25 mg Intravenous Given 01/16/12 2102)     1. Hiccups       MDM  Patient seen and evaluated by Hospitalist for admission.         Nelia Shi, MD 01/18/12 [phone]

## 2012-01-16 NOTE — ED Notes (Signed)
MD at bedside. 

## 2012-01-16 NOTE — Telephone Encounter (Signed)
Pt called stating that he has no relief with his nausea/vomiting/ hiccups are still uncontrolled.  Pt was in the ED for these symptoms on 12/25 with no relief from the symptoms.  Per Tiana Loft, pt needs an urgent referral to GI MD and pt needs to go to be evaluated in the ED.  Called and spoke with charge RN in ED regarding pt's status.  Pt verbalized understanding.  SLJ

## 2012-01-16 NOTE — ED Notes (Signed)
Pt is a multiple myeloma pt c/o NV, hiccups x 1 month.  C/o chest pressure.

## 2012-01-16 NOTE — ED Notes (Signed)
Admitting MD at bedside.

## 2012-01-16 NOTE — Consult Note (Signed)
[facility] History and Physical  Eddie Thomas ZOX:[id number] DOB: 1945-04-16    PCP:   [name], PA   Chief Complaint: persistent hiccups. Requesting EDP:   Dr Nelva Nay. Reason for consult:  The need for inpatient treatment of persistent hiccups.  HPI: Eddie Thomas is an 66 y.o. 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, and retired Pharmacist, hospital, presents to the ER because of persistent hiccups.  He was given Thorazine, but he admitted that he has not been taking them, as they have not been fixing his problem.  Evaluation in the ER showed unremarkable serology with normal renal fx tests and normal K.  He was given IVF and IV Thorazine, and I was asked to see if he needs to be admitted for further tx.  Family originally was more desirous to have him admitted because of the persistent hiccups.    Rewiew of Systems:  Constitutional: Negative for malaise, fever and chills. No significant weight loss or weight gain Eyes: Negative for eye pain, redness and discharge, diplopia, visual changes, or flashes of light. ENMT: Negative for ear pain, hoarseness, nasal congestion, sinus pressure and sore throat. No headaches; tinnitus, drooling, or problem swallowing. Cardiovascular: Negative for chest pain, palpitations, diaphoresis, dyspnea and peripheral edema. ; No orthopnea, PND Respiratory: Negative for cough, hemoptysis, wheezing and stridor. No pleuritic chestpain. Gastrointestinal: Negative for nausea, vomiting, diarrhea, constipation, abdominal pain, melena, blood in stool, hematemesis, jaundice and rectal bleeding.    Genitourinary: Negative for frequency, dysuria, incontinence,flank pain and hematuria; Musculoskeletal: Negative for back pain and neck pain. Negative for swelling and trauma.;  Skin: . Negative for pruritus, rash, abrasions, bruising and skin lesion.; ulcerations Neuro: Negative for  headache, lightheadedness and neck stiffness. Negative for weakness, altered level of consciousness , altered mental status, extremity weakness, burning feet, involuntary movement, seizure and syncope.  Psych: negative for anxiety, depression, insomnia, tearfulness, panic attacks, hallucinations, paranoia, suicidal or homicidal ideation    Past Medical History  Diagnosis Date  . Multiple myeloma(203.0)   . Diabetes mellitus   . High cholesterol   . Diabetes mellitus 07/03/2011  . Hypertension 07/03/2011  . Hyperlipidemia 07/03/2011    Past Surgical History  Procedure Date  . Limbal stem cell transplant   . Humerus fracture surgery     Medications:  HOME MEDS: Prior to Admission medications   Medication Sig Start Date End Date Taking? Authorizing Provider  bortezomib IV (VELCADE) 3.5 MG injection Inject 1.4 mg into the vein once.   Yes Historical Provider, MD  chlorproMAZINE (THORAZINE) 25 MG tablet Take 25 mg by mouth 3 (three) times daily as needed. Hiccups 01/08/12  Yes Si Gaul, MD  DOXOrubicin (ADRIAMYCIN) 50 MG chemo injection Inject 30 mg into the vein once.   Yes Historical Provider, MD  ezetimibe (ZETIA) 10 MG tablet Take 10 mg by mouth daily.     Yes Historical Provider, MD  haemophilus B polysaccharide conjugate vaccine (ACTHIB) injection Inject 0.5 mLs into the muscle once. 12/08/11  Yes Conni Slipper, PA  lisinopril (PRINIVIL,ZESTRIL) 10 MG tablet Take 10 mg by mouth daily.     Yes Historical Provider, MD  LORazepam (ATIVAN) 1 MG tablet Take 1 mg by mouth every 8 (eight) hours as needed. Hiccups 12/31/11  Yes Ward Givens, MD  metFORMIN (GLUCOPHAGE) 500 MG tablet Take 500 mg by mouth daily with breakfast.    Yes Historical Provider, MD  ondansetron (ZOFRAN) 8 MG  tablet Take 8 mg by mouth every 8 (eight) hours as needed. Nausea   Yes Historical Provider, MD  pantoprazole (PROTONIX) 40 MG tablet Take 1 tablet (40 mg total) by mouth daily. Take 30 minutes to 1 hour  before 1st meal of the day. 11/28/11  Yes Conni Slipper, PA  potassium chloride SA (K-DUR,KLOR-CON) 20 MEQ tablet Take 20 mEq by mouth daily. 01/14/12  Yes John L Molpus, MD  prochlorperazine (COMPAZINE) 10 MG tablet Take 10 mg by mouth every 6 (six) hours as needed. Nausea 11/17/11  Yes Si Gaul, MD  promethazine (PHENERGAN) 25 MG suppository Place 25 mg rectally every 6 (six) hours as needed. 12/31/11  Yes Ward Givens, MD  simvastatin (ZOCOR) 10 MG tablet Take 10 mg by mouth at bedtime.     Yes Historical Provider, MD  valACYclovir (VALTREX) 500 MG tablet Take 1 tablet (500 mg total) by mouth daily. 11/17/11  Yes Si Gaul, MD     Allergies:  No Known Allergies  Social History:   reports that he has never smoked. He has never used smokeless tobacco. He reports that he does not drink alcohol or use illicit drugs.  Family History: Family History  Problem Relation Age of Onset  . Diabetes Mother   . Hyperlipidemia Mother   . Cancer Mother   . Diabetes Sister      Physical Exam: Filed Vitals:   01/16/12 1650 01/16/12 1926  BP: 159/90 154/83  Pulse: 96 88  Temp: 99 F (37.2 C)   TempSrc: Oral   Resp: 24 16  Height: 6\' 1"  (1.854 m)   Weight: 63.05 kg (139 lb)   SpO2: 100% 100%   Blood pressure 154/83, pulse 88, temperature 99 F (37.2 C), temperature source Oral, resp. rate 16, height 6\' 1"  (1.854 m), weight 63.05 kg (139 lb), SpO2 100.00%.  GEN:  Pleasant  patient lying in the stretcher in no acute distress; cooperative with exam. PSYCH:  alert and oriented x4; does not appear anxious or depressed; affect is appropriate. HEENT: Mucous membranes pink and anicteric; PERRLA; EOM intact; no cervical lymphadenopathy nor thyromegaly or carotid bruit; no JVD; There were no stridor. Neck is very supple. Breasts:: Not examined CHEST WALL: No tenderness CHEST: Normal respiration, clear to auscultation bilaterally.  HEART: Regular rate and rhythm.  There are no murmur,  rub, or gallops.   BACK: No kyphosis or scoliosis; no CVA tenderness ABDOMEN: soft and non-tender; no masses, no organomegaly, normal abdominal bowel sounds; no pannus; no intertriginous candida. There is no rebound and no distention. Rectal Exam: Not done EXTREMITIES: No bone or joint deformity; age-appropriate arthropathy of the hands and knees; no edema; no ulcerations.  There is no calf tenderness. Genitalia: not examined PULSES: 2+ and symmetric SKIN: Normal hydration no rash or ulceration CNS: Cranial nerves 2-12 grossly intact no focal lateralizing neurologic deficit.  Speech is fluent; uvula elevated with phonation, facial symmetry and tongue midline. DTR are normal bilaterally, cerebella exam is intact, barbinski is negative and strengths are equaled bilaterally.  No sensory loss.  He has one hiccup in the past 30 minutes I saw him.   Labs on Admission:  Basic Metabolic Panel:  Lab 01/16/12 1610 01/14/12 0513  NA 138 139  K 3.7 2.8*  CL 101 98  CO2 25 26  GLUCOSE 120* 174*  BUN 5* 7  CREATININE 0.84 0.80  CALCIUM 9.8 8.9  MG -- --  PHOS -- --   Liver Function Tests:  Lab  01/16/12 1855 01/14/12 0513  AST 21 16  ALT 12 10  ALKPHOS 81 78  BILITOT 1.2 1.0  PROT 6.7 6.4  ALBUMIN 3.7 3.6   No results found for this basename: LIPASE:5,AMYLASE:5 in the last 168 hours No results found for this basename: AMMONIA:5 in the last 168 hours CBC:  Lab 01/16/12 1855 01/14/12 0513  WBC 5.0 5.3  NEUTROABS 3.3 4.2  HGB 10.4* 10.2*  HCT 32.3* 30.0*  MCV 74.1* 72.3*  PLT 214 241   Cardiac Enzymes: No results found for this basename: CKTOTAL:5,CKMB:5,CKMBINDEX:5,TROPONINI:5 in the last 168 hours  CBG: No results found for this basename: GLUCAP:5 in the last 168 hours   Radiological Exams on Admission: No results found.   Assessment/Plan  PLAN:  Unfortunately, medication at this time is the only recommendation I have for him.  He is not significantly dehydrated, and he  can receive some IVF in the ER.  I recommend to give him some iVF, and have him see his PCP, but I stress that he should take his Thorazine is he has persistent hiccups.  Part of this recommendation is that if he require more drastic treatment of his hiccup, such as vagotomy, it is important to know that less invasive treatment had been implemented.  It is unlikely that I can fix his hiccup with an overnight stay.  I spoke with Dr Radford Pax, the patient, and the patient daughter and we agreed.  I did encourage him to drink adequately to avoid dehydration.  Thank you for asking me to consult on this nice patient.  Other plans as per orders.  Code Status: FULL Unk Lightning, MD. [facility] Pager [phone] 7pm to 7am.  01/16/2012, 11:12 PM

## 2012-01-19 ENCOUNTER — Ambulatory Visit (HOSPITAL_BASED_OUTPATIENT_CLINIC_OR_DEPARTMENT_OTHER): Payer: Medicare Other

## 2012-01-19 ENCOUNTER — Other Ambulatory Visit (HOSPITAL_BASED_OUTPATIENT_CLINIC_OR_DEPARTMENT_OTHER): Payer: Medicare Other

## 2012-01-19 ENCOUNTER — Telehealth: Payer: Self-pay | Admitting: Internal Medicine

## 2012-01-19 ENCOUNTER — Ambulatory Visit (HOSPITAL_BASED_OUTPATIENT_CLINIC_OR_DEPARTMENT_OTHER): Payer: Medicare Other | Admitting: Internal Medicine

## 2012-01-19 ENCOUNTER — Encounter: Payer: Self-pay | Admitting: Internal Medicine

## 2012-01-19 ENCOUNTER — Other Ambulatory Visit: Payer: Self-pay | Admitting: *Deleted

## 2012-01-19 ENCOUNTER — Ambulatory Visit: Payer: Medicare Other | Admitting: Nutrition

## 2012-01-19 VITALS — BP 143/76 | HR 90 | Temp 98.3°F | Resp 20 | Ht 73.0 in | Wt 148.0 lb

## 2012-01-19 DIAGNOSIS — Z5112 Encounter for antineoplastic immunotherapy: Secondary | ICD-10-CM

## 2012-01-19 DIAGNOSIS — G609 Hereditary and idiopathic neuropathy, unspecified: Secondary | ICD-10-CM

## 2012-01-19 DIAGNOSIS — R066 Hiccough: Secondary | ICD-10-CM

## 2012-01-19 DIAGNOSIS — C9000 Multiple myeloma not having achieved remission: Secondary | ICD-10-CM

## 2012-01-19 DIAGNOSIS — R1314 Dysphagia, pharyngoesophageal phase: Secondary | ICD-10-CM

## 2012-01-19 LAB — COMPREHENSIVE METABOLIC PANEL (CC13)
AST: 14 U/L (ref 5–34)
Albumin: 3.3 g/dL — ABNORMAL LOW (ref 3.5–5.0)
Alkaline Phosphatase: 68 U/L (ref 40–150)
BUN: 7 mg/dL (ref 7.0–26.0)
Glucose: 138 mg/dl — ABNORMAL HIGH (ref 70–99)
Potassium: 3.9 mEq/L (ref 3.5–5.1)
Sodium: 141 mEq/L (ref 136–145)
Total Bilirubin: 1.06 mg/dL (ref 0.20–1.20)
Total Protein: 5.6 g/dL — ABNORMAL LOW (ref 6.4–8.3)

## 2012-01-19 LAB — CBC WITH DIFFERENTIAL/PLATELET
EOS%: 1.8 % (ref 0.0–7.0)
LYMPH%: 18.8 % (ref 14.0–49.0)
MCH: 25.1 pg — ABNORMAL LOW (ref 27.2–33.4)
MCV: 76.5 fL — ABNORMAL LOW (ref 79.3–98.0)
MONO%: 17.6 % — ABNORMAL HIGH (ref 0.0–14.0)
Platelets: 183 10*3/uL (ref 140–400)
RBC: 3.72 10*6/uL — ABNORMAL LOW (ref 4.20–5.82)
RDW: 16.9 % — ABNORMAL HIGH (ref 11.0–14.6)

## 2012-01-19 MED ORDER — ONDANSETRON HCL 8 MG PO TABS
8.0000 mg | ORAL_TABLET | Freq: Once | ORAL | Status: AC
  Administered 2012-01-19: 8 mg via ORAL

## 2012-01-19 MED ORDER — SODIUM CHLORIDE 0.9 % IV SOLN: Freq: Once | INTRAVENOUS | Status: DC

## 2012-01-19 MED ORDER — BORTEZOMIB CHEMO SQ INJECTION 3.5 MG (2.5MG/ML)
1.0000 mg/m2 | Freq: Once | INTRAMUSCULAR | Status: AC
  Administered 2012-01-19: 2 mg via SUBCUTANEOUS
  Filled 2012-01-19: qty 2

## 2012-01-19 NOTE — Progress Notes (Signed)
Patient reports slight improvement with hiccups and vomiting. He was able to sleep a little bit over the weekend. His weight has declined to 148 pounds on December 30 from 158 pounds December 16. Patient verbalizing concern that hiccups will recur with treatment.  Nutrition diagnosis: Food and nutrition related knowledge deficit improved.  New nutrition diagnosis: Inadequate oral intake related to hiccups and vomiting as evidenced by 10 pound weight loss in 2 weeks.  Intervention: I educated patient to continue small amounts of higher calorie, higher protein foods throughout the day as tolerated. He was encouraged to consume Ensure Plus as an oral nutrition supplement.  Monitoring, evaluation, goals: Patient has been unable to increase oral intake and has had weight loss since last visit. He will work to increase oral intake this week.  Next visit: Thursday, January 9 during chemotherapy.

## 2012-01-19 NOTE — Patient Instructions (Signed)
You have significant improvement in your disease based on the protein study performed recently. We'll continue with the chemotherapy as scheduled.

## 2012-01-19 NOTE — Patient Instructions (Signed)
Patient aware of next appointment; discharged home with no complaints. 

## 2012-01-19 NOTE — Progress Notes (Signed)
[facility] [phone]   [phone]  OFFICE PROGRESS NOTE  Eddie Lean, PA [address] 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   PRIOR THERAPY: :  1. Status post 6 cycles of systemic chemotherapy with Revlimid and Decadron, last dose was given 07/21/2010 with very good response. 2. 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 Lance Bosch.  3. maintenance Revlimid at 10 mg by mouth daily status post 2 months. Therapy began 01/18/2011. 4. maintenance Revlimid at 15 mg by mouth daily with prophylactic dose Coumadin at 2 mg by mouth daily  CURRENT THERAPY:  1. Zometa 4 mg IV every 4 weeks.  2. Systemic chemotherapy with Velcade at 1.3 mg per meter squared given on days 1, 4, 8 and 11 and Doxil at 30 mg per meter square given on day 4 and Decadron 40 mg by mouth on weekly basis given every 3 weeks. Status post 2 cycles  INTERVAL HISTORY: Eddie Thomas 66 y.o. male returns to the clinic today for followup visit. The patient has been complaining recently of headache of and treated with Thorazine. He did not have any head up over the last 2 days. He continues also to complain of peripheral neuropathy especially in the feet. He has some dysphagia and the proximal esophagus and lost around 10 pounds since his last visit. Otherwise she is feeling fine with no specific complaints. He tolerated the last cycle of his chemotherapy with Velcade Doxil and Decadron fairly well. The patient had repeat CBC, comprehensive metabolic panel, LDH and myeloma panel performed recently and he is here for evaluation and discussion of his lab results.  MEDICAL HISTORY: Past Medical History  Diagnosis Date  . Multiple myeloma(203.0)   . Diabetes mellitus   . High cholesterol   . Diabetes mellitus 07/03/2011  . Hypertension 07/03/2011  .  Hyperlipidemia 07/03/2011    ALLERGIES:   has no known allergies.  MEDICATIONS:  Current Outpatient Prescriptions  Medication Sig Dispense Refill  . bortezomib IV (VELCADE) 3.5 MG injection Inject 1.4 mg into the vein once.      . chlorproMAZINE (THORAZINE) 25 MG tablet Take 25 mg by mouth 3 (three) times daily as needed. Hiccups      . DOXOrubicin (ADRIAMYCIN) 50 MG chemo injection Inject 30 mg into the vein once.      . ezetimibe (ZETIA) 10 MG tablet Take 10 mg by mouth daily.        . haemophilus B polysaccharide conjugate vaccine (ACTHIB) injection Inject 0.5 mLs into the muscle once.  1 each  0  . lisinopril (PRINIVIL,ZESTRIL) 10 MG tablet Take 10 mg by mouth daily.        [name] LORazepam (ATIVAN) 1 MG tablet Take 1 mg by mouth every 8 (eight) hours as needed. Hiccups      . metFORMIN (GLUCOPHAGE) 500 MG tablet Take 500 mg by mouth daily with breakfast.       . ondansetron (ZOFRAN) 8 MG tablet Take 8 mg by mouth every 8 (eight) hours as needed. Nausea      . pantoprazole (PROTONIX) 40 MG tablet Take 1 tablet (40 mg total) by mouth daily. Take 30 minutes to 1 hour before 1st meal of the day.  30 tablet  1  . potassium chloride SA (K-DUR,KLOR-CON) 20 MEQ tablet Take 20 mEq by mouth daily.      [name]  prochlorperazine (COMPAZINE) 10 MG tablet Take 10 mg by mouth every 6 (six) hours as needed. Nausea      . promethazine (PHENERGAN) 25 MG suppository Place 25 mg rectally every 6 (six) hours as needed.      . simvastatin (ZOCOR) 10 MG tablet Take 10 mg by mouth at bedtime.        . valACYclovir (VALTREX) 500 MG tablet Take 1 tablet (500 mg total) by mouth daily.  30 tablet  3    SURGICAL HISTORY:  Past Surgical History  Procedure Date  . Limbal stem cell transplant   . Humerus fracture surgery     REVIEW OF SYSTEMS:  A comprehensive review of systems was negative except for: Constitutional: positive for fatigue and weight loss Gastrointestinal: positive for dysphagia Neurological: positive for  paresthesia   PHYSICAL EXAMINATION: General appearance: alert, cooperative and no distress Head: Normocephalic, without obvious abnormality, atraumatic Neck: no adenopathy Lymph nodes: Cervical, supraclavicular, and axillary nodes normal. Resp: clear to auscultation bilaterally Cardio: regular rate and rhythm, S1, S2 normal, no murmur, click, rub or gallop GI: soft, non-tender; bowel sounds normal; no masses,  no organomegaly Extremities: extremities normal, atraumatic, no cyanosis or edema Neurologic: Alert and oriented X 3, normal strength and tone. Normal symmetric reflexes. Normal coordination and gait  ECOG PERFORMANCE STATUS: 1 - Symptomatic but completely ambulatory  There were no vitals taken for this visit.  LABORATORY DATA: Lab Results  Component Value Date   WBC 2.9* 01/19/2012   HGB 9.3* 01/19/2012   HCT 28.4* 01/19/2012   MCV 76.5* 01/19/2012   PLT 183 01/19/2012      Chemistry      Component Value Date/Time   NA 138 01/16/2012 1855   NA 141 01/08/2012 0914   K 3.7 01/16/2012 1855   K 3.3* 01/08/2012 0914   CL 101 01/16/2012 1855   CL 104 01/08/2012 0914   CO2 25 01/16/2012 1855   CO2 31* 01/08/2012 0914   BUN 5* 01/16/2012 1855   BUN 8.0 01/08/2012 0914   CREATININE 0.84 01/16/2012 1855   CREATININE 0.8 01/08/2012 0914      Component Value Date/Time   CALCIUM 9.8 01/16/2012 1855   CALCIUM 8.4 01/08/2012 0914   ALKPHOS 81 01/16/2012 1855   ALKPHOS 84 01/08/2012 0914   AST 21 01/16/2012 1855   AST 14 01/08/2012 0914   ALT 12 01/16/2012 1855   ALT 14 01/08/2012 0914   BILITOT 1.2 01/16/2012 1855   BILITOT 1.71* 01/08/2012 0914     Other lab data: Beta-2 microglobulin 2.12, free kappa light chain 0.03, free lambda light chain 1.99, kappa/lambda ratio 0.02, IgG 280, IgA 570, IgM less than 5.  RADIOGRAPHIC STUDIES: US Abdomen Complete  01/05/2012  *RADIOLOGY REPORT*  Clinical Data:  Elevated bilirubin.  Height of the leukemia. Multiple myeloma.   COMPLETE ABDOMINAL ULTRASOUND  Comparison:  None available.  Findings:  Gallbladder:  Minimal sludge is present within the gallbladder. There are no stones.  There is no wall thickening.  No sonographic Murphy's sign is reported.  Common bile duct:  Normal in caliber. No biliary ductal dilation. The maximal diameter is 4.1 mm, within normal limits.  Liver:  Multiple small cysts are present.  The largest measures 8 mm.  A hyperechoic lesion within the left lower lobe measures 9 mm, compatible with a hemangioma.  IVC:  Appears normal.  Pancreas:  No focal abnormality seen.  Spleen:  Normal size and echotexture without focal parenchymal abnormality.  The  maximal length is 5.8 cm.  Right Kidney:  No hydronephrosis.  Well-preserved cortex.  Normal size and parenchymal echotexture without focal abnormalities. The maximal length is 11.3 cm, within normal limits.  Left Kidney:  No hydronephrosis.  Well-preserved cortex.  Normal size and parenchymal echotexture without focal abnormalities. The maximal length is 11.9 cm, within normal limits.  Abdominal aorta:  No aneurysm identified.  IMPRESSION:  1.  Tiny cysts within the liver. 2.  Minimal gallbladder sludge but no evidence for cholecystitis. 3.  Probable hemangioma within the left lobe of the liver.   Original Report Authenticated By: Marin Roberts, M.D.     ASSESSMENT: This is a very pleasant 66 years old African American male with history of multiple myeloma currently on treatment with Velcade, Doxil and Decadron status post 3 cycles. He has significant improvement in his disease. The patient continues to complain of peripheral neuropathy as well as dysphagia and hiccup.   PLAN: I discussed the lab result with the patient today. I recommended for him to continue on the same regimen of chemotherapy but I would reduce the dose of Decadron to 20 mg by mouth on a weekly basis. I would also reduce the dose of Velcade 1 mg/M2 on days 1, 4, 8 and 11 every 3 weeks.    For the proximal dysphagia, I ordered CT of the neck to rule out any external pressure.  For the hiccup, the patient will continue on Thorazine 25 mg by mouth every 8 hours as needed, and if no improvement I may consider the patient for treatment with baclofen. He would come back for followup visit in 3 weeks with the next cycle of his chemotherapy.  All questions were answered. The patient knows to call the clinic with any problems, questions or concerns. We can certainly see the patient much sooner if necessary.  I spent 15 minutes counseling the patient face to face. The total time spent in the appointment was 25 minutes.

## 2012-01-19 NOTE — Telephone Encounter (Signed)
Pt given appt schedule for January. Referral for GI cx'd per desk nurse not needed - pt aware. Pt aware central will call re ct.

## 2012-01-22 ENCOUNTER — Ambulatory Visit (HOSPITAL_BASED_OUTPATIENT_CLINIC_OR_DEPARTMENT_OTHER): Payer: Medicare Other

## 2012-01-22 ENCOUNTER — Other Ambulatory Visit (HOSPITAL_BASED_OUTPATIENT_CLINIC_OR_DEPARTMENT_OTHER): Payer: Medicare Other | Admitting: Lab

## 2012-01-22 ENCOUNTER — Ambulatory Visit (HOSPITAL_COMMUNITY)
Admission: RE | Admit: 2012-01-22 | Discharge: 2012-01-22 | Disposition: A | Discharge status: Home / Self Care | Payer: Medicare Other | Source: Ambulatory Visit | Attending: Internal Medicine | Admitting: Internal Medicine

## 2012-01-22 VITALS — BP 138/79 | HR 80 | Temp 98.2°F | Resp 16

## 2012-01-22 DIAGNOSIS — R634 Abnormal weight loss: Secondary | ICD-10-CM | POA: Insufficient documentation

## 2012-01-22 DIAGNOSIS — Z5112 Encounter for antineoplastic immunotherapy: Secondary | ICD-10-CM

## 2012-01-22 DIAGNOSIS — R1310 Dysphagia, unspecified: Secondary | ICD-10-CM | POA: Insufficient documentation

## 2012-01-22 DIAGNOSIS — C9000 Multiple myeloma not having achieved remission: Secondary | ICD-10-CM | POA: Insufficient documentation

## 2012-01-22 DIAGNOSIS — Z5111 Encounter for antineoplastic chemotherapy: Secondary | ICD-10-CM

## 2012-01-22 DIAGNOSIS — R1314 Dysphagia, pharyngoesophageal phase: Secondary | ICD-10-CM | POA: Insufficient documentation

## 2012-01-22 DIAGNOSIS — Z9221 Personal history of antineoplastic chemotherapy: Secondary | ICD-10-CM | POA: Insufficient documentation

## 2012-01-22 DIAGNOSIS — R066 Hiccough: Secondary | ICD-10-CM | POA: Insufficient documentation

## 2012-01-22 LAB — COMPREHENSIVE METABOLIC PANEL (CC13)
AST: 15 U/L (ref 5–34)
Albumin: 3.3 g/dL — ABNORMAL LOW (ref 3.5–5.0)
Alkaline Phosphatase: 73 U/L (ref 40–150)
BUN: 6 mg/dL — ABNORMAL LOW (ref 7.0–26.0)
Creatinine: 0.8 mg/dL (ref 0.7–1.3)
Glucose: 174 mg/dl — ABNORMAL HIGH (ref 70–99)
Potassium: 4.5 mEq/L (ref 3.5–5.1)
Total Bilirubin: 0.86 mg/dL (ref 0.20–1.20)

## 2012-01-22 LAB — CBC WITH DIFFERENTIAL/PLATELET
BASO%: 0.2 % (ref 0.0–2.0)
EOS%: 0.2 % (ref 0.0–7.0)
HCT: 29 % — ABNORMAL LOW (ref 38.4–49.9)
MCH: 23.8 pg — ABNORMAL LOW (ref 27.2–33.4)
MCHC: 31.7 g/dL — ABNORMAL LOW (ref 32.0–36.0)
NEUT%: 85.1 % — ABNORMAL HIGH (ref 39.0–75.0)
RBC: 3.87 10*6/uL — ABNORMAL LOW (ref 4.20–5.82)
RDW: 16.5 % — ABNORMAL HIGH (ref 11.0–14.6)
WBC: 4.8 10*3/uL (ref 4.0–10.3)
lymph#: 0.4 10*3/uL — ABNORMAL LOW (ref 0.9–3.3)
nRBC: 0 % (ref 0–0)

## 2012-01-22 MED ORDER — DOXORUBICIN HCL LIPOSOMAL CHEMO INJECTION 2 MG/ML
30.0000 mg/m2 | Freq: Once | INTRAVENOUS | Status: AC
  Administered 2012-01-22: 60 mg via INTRAVENOUS
  Filled 2012-01-22: qty 30

## 2012-01-22 MED ORDER — BORTEZOMIB CHEMO SQ INJECTION 3.5 MG (2.5MG/ML)
1.0000 mg/m2 | Freq: Once | INTRAMUSCULAR | Status: AC
  Administered 2012-01-22: 2 mg via SUBCUTANEOUS
  Filled 2012-01-22: qty 2

## 2012-01-22 MED ORDER — IOHEXOL 300 MG/ML  SOLN
100.0000 mL | Freq: Once | INTRAMUSCULAR | Status: AC | PRN
  Administered 2012-01-22: 100 mL via INTRAVENOUS

## 2012-01-22 MED ORDER — ONDANSETRON 16 MG/50ML IVPB (CHCC)
16.0000 mg | Freq: Once | INTRAVENOUS | Status: AC
  Administered 2012-01-22: 16 mg via INTRAVENOUS

## 2012-01-22 MED ORDER — SODIUM CHLORIDE 0.9 % IV SOLN
Freq: Once | INTRAVENOUS | Status: AC
  Administered 2012-01-22: 13:00:00 via INTRAVENOUS

## 2012-01-22 NOTE — Patient Instructions (Addendum)
[facility] Discharge Instructions for Patients Receiving Chemotherapy  Today you received the following chemotherapy agents Velcade and Doxil.  To help prevent nausea and vomiting after your treatment, we encourage you to take your nausea medication. If you develop nausea and vomiting that is not controlled by your nausea medication, call the clinic. If it is after clinic hours your family physician or the after hours number for the clinic or go to the Emergency Department.   BELOW ARE SYMPTOMS THAT SHOULD BE REPORTED IMMEDIATELY:  *FEVER GREATER THAN 100.5 F  *CHILLS WITH OR WITHOUT FEVER  NAUSEA AND VOMITING THAT IS NOT CONTROLLED WITH YOUR NAUSEA MEDICATION  *UNUSUAL SHORTNESS OF BREATH  *UNUSUAL BRUISING OR BLEEDING  TENDERNESS IN MOUTH AND THROAT WITH OR WITHOUT PRESENCE OF ULCERS  *URINARY PROBLEMS  *BOWEL PROBLEMS  UNUSUAL RASH Items with * indicate a potential emergency and should be followed up as soon as possible.  One of the nurses will contact you 24 hours after your treatment. Please let the nurse know about any problems that you may have experienced. Feel free to call the clinic you have any questions or concerns. The clinic phone number is [phone].   I have been informed and understand all the instructions given to me. I know to contact the clinic, my physician, or go to the Emergency Department if any problems should occur. I do not have any questions at this time, but understand that I may call the clinic during office hours   should I have any questions or need assistance in obtaining follow up care.    __________________________________________  _____________  __________ Signature of Patient or Authorized Representative            Date                   Time    __________________________________________ Nurse's Signature

## 2012-01-26 ENCOUNTER — Other Ambulatory Visit: Payer: Self-pay | Admitting: Medical Oncology

## 2012-01-26 ENCOUNTER — Other Ambulatory Visit (HOSPITAL_BASED_OUTPATIENT_CLINIC_OR_DEPARTMENT_OTHER): Payer: Medicare Other | Admitting: Lab

## 2012-01-26 ENCOUNTER — Ambulatory Visit (HOSPITAL_BASED_OUTPATIENT_CLINIC_OR_DEPARTMENT_OTHER): Payer: Medicare Other

## 2012-01-26 VITALS — BP 115/72 | HR 96 | Temp 98.2°F | Resp 20

## 2012-01-26 DIAGNOSIS — Z5112 Encounter for antineoplastic immunotherapy: Secondary | ICD-10-CM

## 2012-01-26 DIAGNOSIS — C9000 Multiple myeloma not having achieved remission: Secondary | ICD-10-CM

## 2012-01-26 LAB — COMPREHENSIVE METABOLIC PANEL (CC13)
ALT: 7 U/L (ref 0–55)
AST: 13 U/L (ref 5–34)
CO2: 24 mEq/L (ref 22–29)
Sodium: 139 mEq/L (ref 136–145)
Total Bilirubin: 1.17 mg/dL (ref 0.20–1.20)
Total Protein: 6 g/dL — ABNORMAL LOW (ref 6.4–8.3)

## 2012-01-26 LAB — CBC WITH DIFFERENTIAL/PLATELET
Basophils Absolute: 0 10*3/uL (ref 0.0–0.1)
Eosinophils Absolute: 0 10*3/uL (ref 0.0–0.5)
HGB: 9.6 g/dL — ABNORMAL LOW (ref 13.0–17.1)
LYMPH%: 14.4 % (ref 14.0–49.0)
MCV: 75.9 fL — ABNORMAL LOW (ref 79.3–98.0)
MONO#: 0.7 10*3/uL (ref 0.1–0.9)
NEUT#: 2.3 10*3/uL (ref 1.5–6.5)
Platelets: 176 10*3/uL (ref 140–400)
RBC: 3.86 10*6/uL — ABNORMAL LOW (ref 4.20–5.82)
WBC: 3.6 10*3/uL — ABNORMAL LOW (ref 4.0–10.3)

## 2012-01-26 LAB — LACTATE DEHYDROGENASE (CC13): LDH: 190 U/L (ref 125–245)

## 2012-01-26 MED ORDER — BORTEZOMIB CHEMO SQ INJECTION 3.5 MG (2.5MG/ML)
1.0000 mg/m2 | Freq: Once | INTRAMUSCULAR | Status: AC
  Administered 2012-01-26: 2 mg via SUBCUTANEOUS
  Filled 2012-01-26: qty 2

## 2012-01-26 MED ORDER — ONDANSETRON HCL 8 MG PO TABS
8.0000 mg | ORAL_TABLET | Freq: Once | ORAL | Status: AC
  Administered 2012-01-26: 8 mg via ORAL

## 2012-01-26 NOTE — Patient Instructions (Addendum)
[facility] Discharge Instructions for Patients Receiving Chemotherapy  Today you received the following chemotherapy agents :  Velcade Subcutaneous.  To help prevent nausea and vomiting after your treatment, we encourage you to take your nausea medication as instructed by your physician.    If you develop nausea and vomiting that is not controlled by your nausea medication, call the clinic. If it is after clinic hours your family physician or the after hours number for the clinic or go to the Emergency Department.   BELOW ARE SYMPTOMS THAT SHOULD BE REPORTED IMMEDIATELY:  *FEVER GREATER THAN 100.5 F  *CHILLS WITH OR WITHOUT FEVER  NAUSEA AND VOMITING THAT IS NOT CONTROLLED WITH YOUR NAUSEA MEDICATION  *UNUSUAL SHORTNESS OF BREATH  *UNUSUAL BRUISING OR BLEEDING  TENDERNESS IN MOUTH AND THROAT WITH OR WITHOUT PRESENCE OF ULCERS  *URINARY PROBLEMS  *BOWEL PROBLEMS  UNUSUAL RASH Items with * indicate a potential emergency and should be followed up as soon as possible.  One of the nurses will contact you 24 hours after your treatment. Please let the nurse know about any problems that you may have experienced. Feel free to call the clinic you have any questions or concerns. The clinic phone number is [phone].   I have been informed and understand all the instructions given to me. I know to contact the clinic, my physician, or go to the Emergency Department if any problems should occur. I do not have any questions at this time, but understand that I may call the clinic during office hours   should I have any questions or need assistance in obtaining follow up care.    __________________________________________  _____________  __________ Signature of Patient or Authorized Representative            Date                   Time    __________________________________________ Nurse's Signature

## 2012-01-26 NOTE — Progress Notes (Signed)
Pt brought a copy of blood and bone  Arrow transplant vaccination orders from [facility] . He wants to receive them on 01/29/12. Orders sent to Dr Arbutus Ped to enter

## 2012-01-29 ENCOUNTER — Other Ambulatory Visit (HOSPITAL_BASED_OUTPATIENT_CLINIC_OR_DEPARTMENT_OTHER): Payer: Medicare Other | Admitting: Lab

## 2012-01-29 ENCOUNTER — Ambulatory Visit (HOSPITAL_BASED_OUTPATIENT_CLINIC_OR_DEPARTMENT_OTHER): Payer: Medicare Other

## 2012-01-29 ENCOUNTER — Other Ambulatory Visit: Payer: Self-pay | Admitting: Medical Oncology

## 2012-01-29 ENCOUNTER — Ambulatory Visit: Payer: Medicare Other | Admitting: Nutrition

## 2012-01-29 VITALS — BP 121/75 | HR 101 | Temp 97.6°F | Resp 17

## 2012-01-29 DIAGNOSIS — Z23 Encounter for immunization: Secondary | ICD-10-CM

## 2012-01-29 DIAGNOSIS — Z5112 Encounter for antineoplastic immunotherapy: Secondary | ICD-10-CM

## 2012-01-29 DIAGNOSIS — I10 Essential (primary) hypertension: Secondary | ICD-10-CM

## 2012-01-29 DIAGNOSIS — E785 Hyperlipidemia, unspecified: Secondary | ICD-10-CM

## 2012-01-29 DIAGNOSIS — C9000 Multiple myeloma not having achieved remission: Secondary | ICD-10-CM

## 2012-01-29 LAB — COMPREHENSIVE METABOLIC PANEL (CC13)
ALT: 9 U/L (ref 0–55)
AST: 13 U/L (ref 5–34)
Alkaline Phosphatase: 62 U/L (ref 40–150)
Glucose: 159 mg/dl — ABNORMAL HIGH (ref 70–99)
Potassium: 4.1 mEq/L (ref 3.5–5.1)
Sodium: 136 mEq/L (ref 136–145)
Total Bilirubin: 0.98 mg/dL (ref 0.20–1.20)
Total Protein: 6 g/dL — ABNORMAL LOW (ref 6.4–8.3)

## 2012-01-29 LAB — CBC WITH DIFFERENTIAL/PLATELET
BASO%: 0.2 % (ref 0.0–2.0)
EOS%: 0.8 % (ref 0.0–7.0)
LYMPH%: 14 % (ref 14.0–49.0)
MCH: 25.2 pg — ABNORMAL LOW (ref 27.2–33.4)
MCHC: 33.1 g/dL (ref 32.0–36.0)
MCV: 76 fL — ABNORMAL LOW (ref 79.3–98.0)
MONO#: 0.6 10*3/uL (ref 0.1–0.9)
MONO%: 15.7 % — ABNORMAL HIGH (ref 0.0–14.0)
NEUT%: 69.3 % (ref 39.0–75.0)
Platelets: 141 10*3/uL (ref 140–400)
RBC: 3.77 10*6/uL — ABNORMAL LOW (ref 4.20–5.82)
WBC: 3.5 10*3/uL — ABNORMAL LOW (ref 4.0–10.3)

## 2012-01-29 MED ORDER — HAEMOPHILUS B POLYSAC CONJ VAC IM SOLR
0.5000 mL | Freq: Once | INTRAMUSCULAR | Status: AC
  Administered 2012-01-29: 0.5 mL via INTRAMUSCULAR
  Filled 2012-01-29: qty 0.5

## 2012-01-29 MED ORDER — PNEUMOCOCCAL 13-VAL CONJ VACC IM SUSP
0.5000 mL | Freq: Once | INTRAMUSCULAR | Status: AC
  Administered 2012-01-29: 0.5 mL via INTRAMUSCULAR
  Filled 2012-01-29: qty 0.5

## 2012-01-29 MED ORDER — BORTEZOMIB CHEMO SQ INJECTION 3.5 MG (2.5MG/ML)
1.0000 mg/m2 | Freq: Once | INTRAMUSCULAR | Status: AC
  Administered 2012-01-29: 2 mg via SUBCUTANEOUS
  Filled 2012-01-29: qty 2

## 2012-01-29 MED ORDER — ONDANSETRON HCL 8 MG PO TABS
8.0000 mg | ORAL_TABLET | Freq: Once | ORAL | Status: AC
  Administered 2012-01-29: 8 mg via ORAL

## 2012-01-29 MED ORDER — DTAP-HEPATITIS B RECOMB-IPV IM SUSP
0.5000 mL | Freq: Once | INTRAMUSCULAR | Status: AC
  Administered 2012-01-29: 0.5 mL via INTRAMUSCULAR
  Filled 2012-01-29: qty 0.5

## 2012-01-29 NOTE — Progress Notes (Signed)
Per nursing staff, patient is doing fine. He did not have any hiccups today. There is no new weight documented. Patient had no nutrition concerns. I will followup with patient on Thursday, January 23. He has my contact information if he has questions or concerns prior to this appointment.

## 2012-02-02 ENCOUNTER — Telehealth: Payer: Self-pay | Admitting: *Deleted

## 2012-02-02 DIAGNOSIS — R066 Hiccough: Secondary | ICD-10-CM

## 2012-02-02 DIAGNOSIS — R1110 Vomiting, unspecified: Secondary | ICD-10-CM

## 2012-02-02 MED ORDER — BACLOFEN 10 MG PO TABS: 10.0000 mg | ORAL_TABLET | Freq: Three times a day (TID) | ORAL | Status: DC

## 2012-02-02 NOTE — Telephone Encounter (Signed)
Pt called stating that his hiccups have returned and he is vomiting when he tried to eat.  Per Dr Donnald Garre, will call in baclofen 10mg  TID and continue taking compazine and zofran.  If he does not find any relief he is to call back.  Per Dr Donnald Garre, will also make GI referral.  Pt verbalized understanding.  SLJ

## 2012-02-04 ENCOUNTER — Emergency Department (HOSPITAL_BASED_OUTPATIENT_CLINIC_OR_DEPARTMENT_OTHER)
Admission: EM | Admit: 2012-02-04 | Discharge: 2012-02-05 | Disposition: A | Discharge status: Home / Self Care | Payer: Medicare Other | Attending: Emergency Medicine | Admitting: Emergency Medicine

## 2012-02-04 ENCOUNTER — Encounter (HOSPITAL_BASED_OUTPATIENT_CLINIC_OR_DEPARTMENT_OTHER): Payer: Self-pay | Admitting: *Deleted

## 2012-02-04 DIAGNOSIS — I10 Essential (primary) hypertension: Secondary | ICD-10-CM | POA: Insufficient documentation

## 2012-02-04 DIAGNOSIS — R630 Anorexia: Secondary | ICD-10-CM | POA: Insufficient documentation

## 2012-02-04 DIAGNOSIS — K209 Esophagitis, unspecified without bleeding: Secondary | ICD-10-CM | POA: Insufficient documentation

## 2012-02-04 DIAGNOSIS — E7800 Pure hypercholesterolemia, unspecified: Secondary | ICD-10-CM | POA: Insufficient documentation

## 2012-02-04 DIAGNOSIS — C9000 Multiple myeloma not having achieved remission: Secondary | ICD-10-CM | POA: Insufficient documentation

## 2012-02-04 DIAGNOSIS — R61 Generalized hyperhidrosis: Secondary | ICD-10-CM | POA: Insufficient documentation

## 2012-02-04 DIAGNOSIS — R112 Nausea with vomiting, unspecified: Secondary | ICD-10-CM | POA: Insufficient documentation

## 2012-02-04 DIAGNOSIS — Z79899 Other long term (current) drug therapy: Secondary | ICD-10-CM | POA: Insufficient documentation

## 2012-02-04 DIAGNOSIS — R42 Dizziness and giddiness: Secondary | ICD-10-CM | POA: Insufficient documentation

## 2012-02-04 DIAGNOSIS — E785 Hyperlipidemia, unspecified: Secondary | ICD-10-CM | POA: Insufficient documentation

## 2012-02-04 DIAGNOSIS — E119 Type 2 diabetes mellitus without complications: Secondary | ICD-10-CM | POA: Insufficient documentation

## 2012-02-04 DIAGNOSIS — Z9221 Personal history of antineoplastic chemotherapy: Secondary | ICD-10-CM | POA: Insufficient documentation

## 2012-02-04 DIAGNOSIS — R066 Hiccough: Secondary | ICD-10-CM | POA: Insufficient documentation

## 2012-02-04 LAB — COMPREHENSIVE METABOLIC PANEL
Alkaline Phosphatase: 71 U/L (ref 39–117)
BUN: 28 mg/dL — ABNORMAL HIGH (ref 6–23)
CO2: 34 mEq/L — ABNORMAL HIGH (ref 19–32)
Chloride: 91 mEq/L — ABNORMAL LOW (ref 96–112)
GFR calc Af Amer: 79 mL/min — ABNORMAL LOW (ref >90)
Glucose, Bld: 231 mg/dL — ABNORMAL HIGH (ref 70–99)
Potassium: 3.8 mEq/L (ref 3.5–5.1)
Total Bilirubin: 1.3 mg/dL — ABNORMAL HIGH (ref 0.3–1.2)

## 2012-02-04 MED ORDER — ONDANSETRON HCL 4 MG/2ML IJ SOLN
INTRAMUSCULAR | Status: AC
  Administered 2012-02-04: 4 mg via INTRAVENOUS
  Filled 2012-02-04: qty 2

## 2012-02-04 MED ORDER — GI COCKTAIL ~~LOC~~
ORAL | Status: AC
  Administered 2012-02-05: 30 mL via ORAL
  Filled 2012-02-04: qty 30

## 2012-02-04 MED ORDER — PANTOPRAZOLE SODIUM 40 MG IV SOLR
40.0000 mg | Freq: Once | INTRAVENOUS | Status: AC
  Administered 2012-02-04: 40 mg via INTRAVENOUS
  Filled 2012-02-04: qty 40

## 2012-02-04 MED ORDER — ONDANSETRON HCL 4 MG/2ML IJ SOLN
4.0000 mg | Freq: Once | INTRAMUSCULAR | Status: AC
  Administered 2012-02-04: 4 mg via INTRAVENOUS

## 2012-02-04 MED ORDER — GI COCKTAIL ~~LOC~~
30.0000 mL | Freq: Once | ORAL | Status: AC
  Administered 2012-02-05: 30 mL via ORAL

## 2012-02-04 MED ORDER — SODIUM CHLORIDE 0.9 % IV BOLUS (SEPSIS)
1000.0000 mL | Freq: Once | INTRAVENOUS | Status: AC
  Administered 2012-02-04: 1000 mL via INTRAVENOUS

## 2012-02-04 NOTE — ED Notes (Signed)
Pt c/o vomiting x 2 weeks

## 2012-02-04 NOTE — ED Provider Notes (Addendum)
History     CSN: [id number]  Arrival date & time 02/04/12  2310   First MD Initiated Contact with Patient 02/04/12 2340      Chief Complaint  Patient presents with  . Burning in Esophagus     (Consider location/radiation/quality/duration/timing/severity/associated sxs/prior treatment) HPI This is a 68 year old male with multiple myeloma undergoing chemotherapy. He undergoes 2 weeks of chemotherapy with a one week respite. As a result of his chemotherapy he has almost chronic nausea and vomiting and hiccups. He has been treated with multiple medications for these symptoms. He has been seen in the ED several times for this. He is here this evening specifically because he developed burning in his esophagus, worse when supine. The symptoms were moderate to severe. He also felt lightheaded and sweaty earlier. He states he has not been eating or drinking adequately and believes he may be dehydrated. He has been taking his home medications without adequate relief. He is being scheduled to see a gastroenterologist but has not yet been able to make an appointment.  Past Medical History  Diagnosis Date  . Multiple myeloma(203.0)   . Diabetes mellitus   . High cholesterol   . Diabetes mellitus 07/03/2011  . Hypertension 07/03/2011  . Hyperlipidemia 07/03/2011    Past Surgical History  Procedure Date  . Limbal stem cell transplant   . Humerus fracture surgery     Family History  Problem Relation Age of Onset  . Diabetes Mother   . Hyperlipidemia Mother   . Cancer Mother   . Diabetes Sister     History  Substance Use Topics  . Smoking status: Never Smoker   . Smokeless tobacco: Never Used  . Alcohol Use: No      Review of Systems  All other systems reviewed and are negative.    Allergies  Review of patient's allergies indicates no known allergies.  Home Medications   Current Outpatient Rx  Name  Route  Sig  Dispense  Refill  . BACLOFEN 10 MG PO TABS   Oral   Take 1  tablet (10 mg total) by mouth 3 (three) times daily.   30 each   0   . BORTEZOMIB CHEMO IV INJECTION 3.5 MG   Intravenous   Inject 1.4 mg into the vein once.         . CHLORPROMAZINE HCL 25 MG PO TABS   Oral   Take 25 mg by mouth 3 (three) times daily as needed. Hiccups         . DOXORUBICIN HCL 50 MG IV SOLR   Intravenous   Inject 30 mg into the vein once.         [name] EZETIMIBE 10 MG PO TABS   Oral   Take 10 mg by mouth daily.           [name] HAEMOPHILUS B POLYSAC CONJ VAC IM SOLR   Intramuscular   Inject 0.5 mLs into the muscle once.   1 each   0     Give 0.5 ml IM once   . LISINOPRIL 10 MG PO TABS   Oral   Take 10 mg by mouth daily.           [name] LORAZEPAM 1 MG PO TABS   Oral   Take 1 mg by mouth every 8 (eight) hours as needed. Hiccups         . METFORMIN HCL 500 MG PO TABS   Oral   Take 500 mg by  mouth daily with breakfast.          . ONDANSETRON HCL 8 MG PO TABS   Oral   Take 8 mg by mouth every 8 (eight) hours as needed. Nausea         . PANTOPRAZOLE SODIUM 40 MG PO TBEC   Oral   Take 1 tablet (40 mg total) by mouth daily. Take 30 minutes to 1 hour before 1st meal of the day.   30 tablet   1   . POTASSIUM CHLORIDE CRYS ER 20 MEQ PO TBCR   Oral   Take 20 mEq by mouth daily.         [name] PROCHLORPERAZINE MALEATE 10 MG PO TABS   Oral   Take 10 mg by mouth every 6 (six) hours as needed. Nausea         . PROMETHAZINE HCL 25 MG RE SUPP   Rectal   Place 25 mg rectally every 6 (six) hours as needed.         [name] SIMVASTATIN 10 MG PO TABS   Oral   Take 10 mg by mouth at bedtime.           [name] VALACYCLOVIR HCL 500 MG PO TABS   Oral   Take 1 tablet (500 mg total) by mouth daily.   30 tablet   3     BP 110/77  Pulse 108  Temp 98.1 F (36.7 C) (Oral)  Resp 18  Ht 6\' 1"  (1.854 m)  Wt 130 lb (58.968 kg)  BMI 17.15 kg/m2  SpO2 100%  Physical Exam General: Well-developed, mildly cachectic male in no acute distress; appearance  consistent with age of record HENT: normocephalic, atraumatic; mucous membranes dry Eyes: pupils equal round and reactive to light; extraocular muscles intact; arcus senilis bilaterally Neck: supple Heart: regular rate and rhythm Lungs: clear to auscultation bilaterally Abdomen: soft; nondistended; nontender; no masses or hepatosplenomegaly; bowel sounds present Extremities: No deformity; full range of motion; pulses normal; no edema Neurologic: Awake, alert and oriented; motor function intact in all extremities and symmetric; no facial droop Skin: Warm and dry Psychiatric: Flat affect    ED Course  Procedures (including critical care time)     MDM   Nursing notes and vitals signs, including pulse oximetry, reviewed.  Summary of this visit's results, reviewed by myself:  Labs:  Results for orders placed during the hospital encounter of 02/04/12 (from the past 24 hour(s))  COMPREHENSIVE METABOLIC PANEL     Status: Abnormal   Collection Time   02/04/12 11:31 PM      Component Value Range   Sodium 137  135 - 145 mEq/L   Potassium 3.8  3.5 - 5.1 mEq/L   Chloride 91 (*) 96 - 112 mEq/L   CO2 34 (*) 19 - 32 mEq/L   Glucose, Bld 231 (*) 70 - 99 mg/dL   BUN 28 (*) 6 - 23 mg/dL   Creatinine, Ser 7.82  0.50 - 1.35 mg/dL   Calcium 9.5  8.4 - 95.6 mg/dL   Total Protein 6.7  6.0 - 8.3 g/dL   Albumin 3.9  3.5 - 5.2 g/dL   AST 18  0 - 37 U/L   ALT 16  0 - 53 U/L   Alkaline Phosphatase 71  39 - 117 U/L   Total Bilirubin 1.3 (*) 0.3 - 1.2 mg/dL   GFR calc non Af Amer 68 (*) >90 mL/min   GFR calc Af Amer 79 (*) >90 mL/min  CBC WITH DIFFERENTIAL     Status: Abnormal   Collection Time   02/04/12 11:31 PM      Component Value Range   WBC 7.3  4.0 - 10.5 K/uL   RBC 4.20 (*) 4.22 - 5.81 MIL/uL   Hemoglobin 10.3 (*) 13.0 - 17.0 g/dL   HCT 16.1 (*) 09.6 - 04.5 %   MCV 72.6 (*) 78.0 - 100.0 fL   MCH 24.5 (*) 26.0 - 34.0 pg   MCHC 33.8  30.0 - 36.0 g/dL   RDW 40.9 (*) 81.1 - 91.4 %    Platelets 247  150 - 400 K/uL   Neutrophils Relative 84 (*) 43 - 77 %   Lymphocytes Relative 6 (*) 12 - 46 %   Monocytes Relative 10  3 - 12 %   Eosinophils Relative 0  0 - 5 %   Basophils Relative 0  0 - 1 %   Neutro Abs 6.2  1.7 - 7.7 K/uL   Lymphs Abs 0.4 (*) 0.7 - 4.0 K/uL   Monocytes Absolute 0.7  0.1 - 1.0 K/uL   Eosinophils Absolute 0.0  0.0 - 0.7 K/uL   Basophils Absolute 0.0  0.0 - 0.1 K/uL   RBC Morphology TARGET CELLS     WBC Morphology WHITE COUNT CONFIRMED ON SMEAR     Smear Review PLATELET COUNT CONFIRMED BY SMEAR     12:23 AM Significant improvement in nausea and esophageal discomfort after GI cocktail and IV Zofran. Patient being given second liter normal saline bolus at this time.  12:56 AM Handwritten prescription for: 2% viscous lidocaine and Maalox 1:1; Sig 30 mL by mouth as needed for heartburn; Dispense 500 mL one refill.         Hanley Seamen, MD 02/05/12 7829  Hanley Seamen, MD 02/05/12 [phone]

## 2012-02-05 LAB — CBC WITH DIFFERENTIAL/PLATELET
Basophils Absolute: 0 10*3/uL (ref 0.0–0.1)
Lymphocytes Relative: 6 % — ABNORMAL LOW (ref 12–46)
Lymphs Abs: 0.4 10*3/uL — ABNORMAL LOW (ref 0.7–4.0)
MCV: 72.6 fL — ABNORMAL LOW (ref 78.0–100.0)
Monocytes Relative: 10 % (ref 3–12)
Platelets: 247 10*3/uL (ref 150–400)
RDW: 15.7 % — ABNORMAL HIGH (ref 11.5–15.5)
WBC: 7.3 10*3/uL (ref 4.0–10.5)

## 2012-02-05 MED ORDER — SODIUM CHLORIDE 0.9 % IV BOLUS (SEPSIS)
1000.0000 mL | Freq: Once | INTRAVENOUS | Status: AC
  Administered 2012-02-05: 1000 mL via INTRAVENOUS

## 2012-02-06 ENCOUNTER — Telehealth: Payer: Self-pay | Admitting: Internal Medicine

## 2012-02-06 NOTE — Telephone Encounter (Signed)
Called pt and he is aware of his dr stark appt on 1/20 and aware that we moved his other appts     anne

## 2012-02-09 ENCOUNTER — Ambulatory Visit: Payer: Medicare Other | Admitting: Physician Assistant

## 2012-02-09 ENCOUNTER — Telehealth: Payer: Self-pay | Admitting: Internal Medicine

## 2012-02-09 ENCOUNTER — Ambulatory Visit (HOSPITAL_BASED_OUTPATIENT_CLINIC_OR_DEPARTMENT_OTHER): Payer: Medicare Other | Admitting: Internal Medicine

## 2012-02-09 ENCOUNTER — Other Ambulatory Visit (HOSPITAL_BASED_OUTPATIENT_CLINIC_OR_DEPARTMENT_OTHER): Payer: Medicare Other | Admitting: Lab

## 2012-02-09 ENCOUNTER — Other Ambulatory Visit: Payer: Medicare Other

## 2012-02-09 ENCOUNTER — Ambulatory Visit: Payer: Medicare Other

## 2012-02-09 ENCOUNTER — Encounter: Payer: Self-pay | Admitting: Gastroenterology

## 2012-02-09 ENCOUNTER — Encounter: Payer: Self-pay | Admitting: Internal Medicine

## 2012-02-09 ENCOUNTER — Ambulatory Visit (INDEPENDENT_AMBULATORY_CARE_PROVIDER_SITE_OTHER): Payer: Medicare Other | Admitting: Gastroenterology

## 2012-02-09 ENCOUNTER — Telehealth: Payer: Self-pay | Admitting: *Deleted

## 2012-02-09 VITALS — BP 120/70 | HR 92 | Temp 97.6°F | Resp 20 | Ht 70.5 in | Wt 136.3 lb

## 2012-02-09 VITALS — BP 100/64 | HR 88 | Ht 70.5 in | Wt 139.0 lb

## 2012-02-09 DIAGNOSIS — R112 Nausea with vomiting, unspecified: Secondary | ICD-10-CM

## 2012-02-09 DIAGNOSIS — C9000 Multiple myeloma not having achieved remission: Secondary | ICD-10-CM

## 2012-02-09 DIAGNOSIS — R066 Hiccough: Secondary | ICD-10-CM

## 2012-02-09 DIAGNOSIS — R630 Anorexia: Secondary | ICD-10-CM

## 2012-02-09 LAB — COMPREHENSIVE METABOLIC PANEL (CC13)
ALT: 13 U/L (ref 0–55)
AST: 15 U/L (ref 5–34)
Albumin: 3.2 g/dL — ABNORMAL LOW (ref 3.5–5.0)
Alkaline Phosphatase: 66 U/L (ref 40–150)
BUN: 12 mg/dL (ref 7.0–26.0)
CO2: 32 meq/L — ABNORMAL HIGH (ref 22–29)
Calcium: 8.9 mg/dL (ref 8.4–10.4)
Chloride: 97 meq/L — ABNORMAL LOW (ref 98–107)
Creatinine: 0.8 mg/dL (ref 0.7–1.3)
Glucose: 174 mg/dL — ABNORMAL HIGH (ref 70–99)
Potassium: 3.2 meq/L — ABNORMAL LOW (ref 3.5–5.1)
Sodium: 138 meq/L (ref 136–145)
Total Bilirubin: 1.3 mg/dL — ABNORMAL HIGH (ref 0.20–1.20)
Total Protein: 5.8 g/dL — ABNORMAL LOW (ref 6.4–8.3)

## 2012-02-09 NOTE — Telephone Encounter (Signed)
gv and pritned pt appt schedule for Feb....emailed michelle to add tx...the patient wanted to wait until chemo to schedule nut...the patient aware

## 2012-02-09 NOTE — Progress Notes (Signed)
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  PRIOR THERAPY: :  1. Status post 6 cycles of systemic chemotherapy with Revlimid and Decadron, last dose was given 07/21/2010 with very good response. 2. 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 Lance Bosch.  3. maintenance Revlimid at 10 mg by mouth daily status post 2 months. Therapy began 01/18/2011. 4. maintenance Revlimid at 15 mg by mouth daily with prophylactic dose Coumadin at 2 mg by mouth daily CURRENT THERAPY:  1. Zometa 4 mg IV every 4 weeks.  2. Systemic chemotherapy with Velcade at 1.3 mg per meter squared given on days 1, 4, 8 and 11 and Doxil at 30 mg per meter square given on day 4 and Decadron 40 mg by mouth on weekly basis given every 3 weeks. Status post 4 cycles  INTERVAL HISTORY: Eddie Thomas 67 y.o. male returns to the clinic today for followup visit accompanied his wife. The patient continues to complain of nausea and vomiting which is better than the last few days in addition to hiccuping he is currently on baclofen 10 mg by mouth 3 times a day in addition to Thorazine 25 mg by mouth twice a day. He was referred to Dr. Russella Dar for evaluation of his persistent nausea and vomiting as well as headache. He scheduled for upper endoscopy tomorrow. The patient lost several pounds recently secondary to his nausea and vomiting. He denied having any significant chest pain or shortness breath. He denied having any fever or chills. He was supposed to start cycle #5 of his chemotherapy today.  MEDICAL HISTORY: Past Medical History  Diagnosis Date  . Multiple myeloma(203.0)   . Diabetes mellitus 07/03/2011  . Hypertension 07/03/2011  . Hyperlipidemia 07/03/2011  . Colon polyps       ALLERGIES:   has no known allergies.  MEDICATIONS:  Current Outpatient Prescriptions  Medication Sig Dispense Refill  . AMBULATORY NON FORMULARY MEDICATION GI Cocktail Lidocaine and Maalox Take by mouth Every day      . baclofen (LIORESAL) 10 MG tablet Take 1 tablet (10 mg total) by mouth 3 (three) times daily.  30 each  0  . bortezomib IV (VELCADE) 3.5 MG injection Inject 1.4 mg into the vein once.      . chlorproMAZINE (THORAZINE) 25 MG tablet Take 25 mg by mouth 3 (three) times daily as needed. Hiccups      . dexamethasone (DECADRON) 4 MG tablet       . DOXOrubicin (ADRIAMYCIN) 50 MG chemo injection Inject 30 mg into the vein once.      . ezetimibe (ZETIA) 10 MG tablet Take 10 mg by mouth daily.        . haemophilus B polysaccharide conjugate vaccine (ACTHIB) injection Inject 0.5 mLs into the muscle once.  1 each  0  . lidocaine-prilocaine (EMLA) cream       . lisinopril (PRINIVIL,ZESTRIL) 10 MG tablet Take 10 mg by mouth daily.        [name] LORazepam (ATIVAN) 1 MG tablet Take 1 mg by mouth every 8 (eight) hours as needed. Hiccups      . metFORMIN (GLUCOPHAGE) 500 MG tablet Take 500 mg by mouth daily with breakfast.       . ondansetron (ZOFRAN) 8  MG tablet Take 8 mg by mouth every 8 (eight) hours as needed. Nausea      . pantoprazole (PROTONIX) 40 MG tablet Take 1 tablet (40 mg total) by mouth daily. Take 30 minutes to 1 hour before 1st meal of the day.  30 tablet  1  . potassium chloride SA (K-DUR,KLOR-CON) 20 MEQ tablet Take 20 mEq by mouth daily.      . prochlorperazine (COMPAZINE) 10 MG tablet Take 10 mg by mouth every 6 (six) hours as needed. Nausea      . promethazine (PHENERGAN) 25 MG suppository Place 25 mg rectally every 6 (six) hours as needed.      . simvastatin (ZOCOR) 10 MG tablet Take 10 mg by mouth at bedtime.        . valACYclovir (VALTREX) 500 MG tablet Take 1 tablet (500 mg total) by mouth daily.  30 tablet  3    SURGICAL HISTORY:  Past Surgical History  Procedure  Date  . Limbal stem cell transplant   . Humerus fracture surgery     right    REVIEW OF SYSTEMS:  A comprehensive review of systems was negative except for: Constitutional: positive for fatigue and weight loss Gastrointestinal: positive for nausea, vomiting and Hiccup.   PHYSICAL EXAMINATION: General appearance: alert, cooperative, fatigued and no distress Head: Normocephalic, without obvious abnormality, atraumatic Neck: no adenopathy Lymph nodes: Cervical, supraclavicular, and axillary nodes normal. Resp: clear to auscultation bilaterally Cardio: regular rate and rhythm, S1, S2 normal, no murmur, click, rub or gallop GI: soft, non-tender; bowel sounds normal; no masses,  no organomegaly Extremities: extremities normal, atraumatic, no cyanosis or edema  ECOG PERFORMANCE STATUS: 1 - Symptomatic but completely ambulatory  Blood pressure 120/70, pulse 92, temperature 97.6 F (36.4 C), temperature source Oral, resp. rate 20, height 5' 10.5" (1.791 m), weight 136 lb 4.8 oz (61.825 kg).  LABORATORY DATA: Lab Results  Component Value Date   WBC 7.3 02/04/2012   HGB 10.3* 02/04/2012   HCT 30.5* 02/04/2012   MCV 72.6* 02/04/2012   PLT 247 02/04/2012      Chemistry      Component Value Date/Time   NA 138 02/09/2012 1205   NA 137 02/04/2012 2331   K 3.2* 02/09/2012 1205   K 3.8 02/04/2012 2331   CL 97* 02/09/2012 1205   CL 91* 02/04/2012 2331   CO2 32* 02/09/2012 1205   CO2 34* 02/04/2012 2331   BUN 12.0 02/09/2012 1205   BUN 28* 02/04/2012 2331   CREATININE 0.8 02/09/2012 1205   CREATININE 1.10 02/04/2012 2331      Component Value Date/Time   CALCIUM 8.9 02/09/2012 1205   CALCIUM 9.5 02/04/2012 2331   ALKPHOS 66 02/09/2012 1205   ALKPHOS 71 02/04/2012 2331   AST 15 02/09/2012 1205   AST 18 02/04/2012 2331   ALT 13 02/09/2012 1205   ALT 16 02/04/2012 2331   BILITOT 1.30* 02/09/2012 1205   BILITOT 1.3* 02/04/2012 2331       RADIOGRAPHIC STUDIES: Ct Soft Tissue Neck W Contrast  01/22/2012   *RADIOLOGY REPORT*  Clinical Data: 68 year old male with multiple myeloma diagnosed in 2011.  Chemotherapy and stem cell transplant complete.  Hiccups. Dysphagia Weight loss.  CT NECK WITH CONTRAST  Technique:  Multidetector CT imaging of the neck was performed with intravenous contrast.  Contrast: OMNIPAQUE IOHEXOL 300 MG/ML  SOLN  Comparison: Head CT without contrast 08/12.  Findings: Previous ORIF right humerus visible on scout view. Negative lung apices.  Grossly normal mediastinum; partially visible soft tissue area in the right paratracheal space on series 4 image 32 is unclear significance but may be related to the azygos arch.  Thyroid, visualized upper thoracic esophagus, cervical esophagus, larynx, nasopharynx, parapharyngeal spaces, sublingual space, retropharyngeal space, submandibular glands and parotid glands are within normal limits.  There is mild asymmetry of the oropharynx and hypopharynx with no discrete pharyngeal mass.  No cervical lymphadenopathy.  Visualized major vascular structures are patent with little atherosclerosis identified. Visualized orbit soft tissues are within normal limits.  Negative visualized brain parenchyma.  Heterogeneous bone mineralization, especially in the spine. Numerous mostly small circumscribed lucent lesions throughout the visible spine and thorax compatible with myelomatous involvement. Among the largest or lesions in the C3 and C7 vertebral bodies. No pathologic fracture or frank destructive lesion identified. Visualized paranasal sinuses and mastoids are clear.  IMPRESSION: 1.  Neck soft tissues are within normal limits. 2.  Diffuse osseous myelomatous involvement. 3.  Presumed partial imaging of the azygos arch in the right paratracheal space.  Dedicated chest CT would confirm.   Original Report Authenticated By: Erskine Speed, M.D.     ASSESSMENT: This is a very pleasant 67 years old [place] American male with recurrent multiple myeloma currently on  systemic chemotherapy with Velcade, Doxil and Decadron. The patient has enough time tolerating his treatment especially with the recurrent nausea and vomiting as well as hiccups.  PLAN: I recommended for the patient to hold the chemotherapy for now. He would have upper endoscopy tomorrow under the care of Dr. Russella Dar. I would see the patient in for followup visit in 2 weeks after repeating myeloma panel. If there is no clear gastrointestinal etiology for his persistent nausea vomiting as well as the hiccups, I would consider changing his chemotherapy regimen. The patient agreed to the current plan.  All questions were answered. The patient knows to call the clinic with any problems, questions or concerns. We can certainly see the patient much sooner if necessary.

## 2012-02-09 NOTE — Patient Instructions (Signed)
You have been scheduled for an endoscopy with propofol. Please follow written instructions given to you at your visit today. If you use inhalers (even only as needed) or a CPAP machine, please bring them with you on the day of your procedure. 

## 2012-02-09 NOTE — Patient Instructions (Signed)
Because of the persistent nausea, vomiting and hiccups, I would hold her chemotherapy for now. Upper endoscopy tomorrow as scheduled by Dr. Russella Dar. Followup in 2 weeks with repeat myeloma panel.

## 2012-02-09 NOTE — Progress Notes (Addendum)
History of Present Illness: This is a 67 year old male with multiple myeloma followed by Dr. Arbutus Ped. He is accompanied by his wife. He is currently maintained on Velcade and Adriamycin and he states both were started in October 2013. For about the past 6-8 weeks he has had problems with nausea, anorexia, weight loss, intermittent vomiting and persistent hiccups. He's been treated with Ativan, Phenergan, Compazine, Thorazine, baclofen, ondansetron and pantoprazole without significant improvement in symptoms except for improvement of vomiting. He has been treated by Dr. Rodena Piety in [facility] and states he underwent colonoscopy last year that showed one polyp. Denies abdominal pain, constipation, diarrhea, change in stool caliber, melena, hematochezia, dysphagia, chest pain.  Review of Systems: Pertinent positive and negative review of systems were noted in the above HPI section. All other review of systems were otherwise negative.  Current Medications, Allergies, Past Medical History, Past Surgical History, Family History and Social History were reviewed in [employer] record.  Physical Exam: General: Well developed, thin, ill-appearing, frequently hiccuping  Head: Normocephalic and atraumatic Eyes:  sclerae anicteric, EOMI Ears: Normal auditory acuity Mouth: No deformity or lesions Neck: Supple, no masses or thyromegaly Lungs: Clear throughout to auscultation Heart: Regular rate and rhythm; no murmurs, rubs or bruits Abdomen: Soft, non tender and non distended. No masses, hepatosplenomegaly or hernias noted. Normal Bowel sounds Musculoskeletal: Symmetrical with no gross deformities  Skin: No lesions on visible extremities Pulses:  Normal pulses noted Extremities: No clubbing, cyanosis, edema or deformities noted Neurological: Alert oriented x 4, grossly nonfocal Cervical Nodes:  No significant cervical adenopathy Inguinal Nodes: No significant inguinal  adenopathy Psychological:  Alert and cooperative. Depressed affect  Assessment and Recommendations:  1. Hiccups, nausea, vomiting, anorexia and weight loss. I suspect these are all side effects from his chemotherapy or possibly from his underlying illness. Consider modifying his current chemotherapy regimen. Rule out esophagitis and other UGI tract disorders that may be exacerbating his symptoms. Continue current medications.  2. Multiple myeloma. Currently treated with Velcade and Adriamycin.   3. History of colon polyps. Will attempt to obtain records from Dr. Noe Gens.

## 2012-02-09 NOTE — Telephone Encounter (Signed)
Per staff message and POF I have scheduled appts.  JMW  

## 2012-02-10 ENCOUNTER — Encounter: Payer: Self-pay | Admitting: Gastroenterology

## 2012-02-10 ENCOUNTER — Ambulatory Visit (AMBULATORY_SURGERY_CENTER): Payer: Medicare Other | Admitting: Gastroenterology

## 2012-02-10 VITALS — BP 147/77 | HR 73 | Temp 97.2°F | Resp 53 | Ht 73.0 in | Wt 130.0 lb

## 2012-02-10 DIAGNOSIS — K209 Esophagitis, unspecified without bleeding: Secondary | ICD-10-CM

## 2012-02-10 DIAGNOSIS — R634 Abnormal weight loss: Secondary | ICD-10-CM

## 2012-02-10 DIAGNOSIS — R066 Hiccough: Secondary | ICD-10-CM

## 2012-02-10 DIAGNOSIS — K259 Gastric ulcer, unspecified as acute or chronic, without hemorrhage or perforation: Secondary | ICD-10-CM

## 2012-02-10 DIAGNOSIS — R112 Nausea with vomiting, unspecified: Secondary | ICD-10-CM

## 2012-02-10 DIAGNOSIS — R630 Anorexia: Secondary | ICD-10-CM

## 2012-02-10 MED ORDER — SODIUM CHLORIDE 0.9 % IV SOLN: 500.0000 mL | INTRAVENOUS | Status: DC

## 2012-02-10 MED ORDER — PANTOPRAZOLE SODIUM 40 MG PO TBEC: 40.0000 mg | DELAYED_RELEASE_TABLET | Freq: Two times a day (BID) | ORAL | Status: DC

## 2012-02-10 MED ORDER — SUCRALFATE 1 GM/10ML PO SUSP: ORAL | Status: DC

## 2012-02-10 NOTE — Patient Instructions (Addendum)
Discharge instructions given with verbal understanding. Handout on esophagitis given. Resume previous medications. YOU HAD AN ENDOSCOPIC PROCEDURE TODAY AT THE [facility]: Refer to the procedure report that was given to you for any specific questions about what was found during the examination.  If the procedure report does not answer your questions, please call your gastroenterologist to clarify.  If you requested that your care partner not be given the details of your procedure findings, then the procedure report has been included in a sealed envelope for you to review at your convenience later.  YOU SHOULD EXPECT: Some feelings of bloating in the abdomen. Passage of more gas than usual.  Walking can help get rid of the air that was put into your GI tract during the procedure and reduce the bloating. If you had a lower endoscopy (such as a colonoscopy or flexible sigmoidoscopy) you may notice spotting of blood in your stool or on the toilet paper. If you underwent a bowel prep for your procedure, then you may not have a normal bowel movement for a few days.  DIET: Your first meal following the procedure should be a light meal and then it is ok to progress to your normal diet.  A half-sandwich or bowl of soup is an example of a good first meal.  Heavy or fried foods are harder to digest and may make you feel nauseous or bloated.  Likewise meals heavy in dairy and vegetables can cause extra gas to form and this can also increase the bloating.  Drink plenty of fluids but you should avoid alcoholic beverages for 24 hours.  ACTIVITY: Your care partner should take you home directly after the procedure.  You should plan to take it easy, moving slowly for the rest of the day.  You can resume normal activity the day after the procedure however you should NOT DRIVE or use heavy machinery for 24 hours (because of the sedation medicines used during the test).    SYMPTOMS TO REPORT IMMEDIATELY: A  gastroenterologist can be reached at any hour.  During normal business hours, 8:30 AM to 5:00 PM Monday through Friday, call [phone].  After hours and on weekends, please call the GI answering service at [phone] who will take a message and have the physician on call contact you.   Following upper endoscopy (EGD)  Vomiting of blood or coffee ground material  New chest pain or pain under the shoulder blades  Painful or persistently difficult swallowing  New shortness of breath  Fever of 100F or higher  Black, tarry-looking stools  FOLLOW UP: If any biopsies were taken you will be contacted by phone or by letter within the next 1-3 weeks.  Call your gastroenterologist if you have not heard about the biopsies in 3 weeks.  Our staff will call the home number listed on your records the next business day following your procedure to check on you and address any questions or concerns that you may have at that time regarding the information given to you following your procedure. This is a courtesy call and so if there is no answer at the home number and we have not heard from you through the emergency physician on call, we will assume that you have returned to your regular daily activities without incident.  SIGNATURES/CONFIDENTIALITY: You and/or your care partner have signed paperwork which will be entered into your electronic medical record.  These signatures attest to the fact that that the information above on  your After Visit Summary has been reviewed and is understood.  Full responsibility of the confidentiality of this discharge information lies with you and/or your care-partner.

## 2012-02-10 NOTE — Progress Notes (Signed)
Patient did not experience any of the following events: a burn prior to discharge; a fall within the facility; wrong site/side/patient/procedure/implant event; or a hospital transfer or hospital admission upon discharge from the facility. (G8907) Patient did not have preoperative order for IV antibiotic SSI prophylaxis. (G8918)  

## 2012-02-10 NOTE — Op Note (Signed)
[facility] [address] [place], [address]   ENDOSCOPY PROCEDURE REPORT  PATIENT: Eddie Thomas, Eddie Thomas  MR#: [id number] BIRTHDATE: 07-09-1945 , 66  yrs. old GENDER: Male ENDOSCOPIST: Meryl Dare, MD, Clementeen Graham REFERRED BY:  Si Gaul, M.D. PROCEDURE DATE:  02/10/2012 PROCEDURE:  EGD w/ biopsy ASA CLASS:     Class III INDICATIONS:  Hiccups, anorexia, nausea, vomiting, weight loss. MEDICATIONS: MAC sedation, administered by CRNA and propofol (Diprivan) 50mg  IV TOPICAL ANESTHETIC: Cetacaine Spray DESCRIPTION OF PROCEDURE: After the risks benefits and alternatives of the procedure were thoroughly explained, informed consent was obtained.  The LB GIF-H180 [id number] endoscope was introduced through the mouth and advanced to the second portion of the duodenum without limitations.  The instrument was slowly withdrawn as the mucosa was fully examined.  ESOPHAGUS: Abnormal mucosa was found in the lower third of the esophagus and middle third of the esophagus.  The mucosa was friable, erythematous and had superficial ulcers. The distal esophagus was narrowed. Multiple biopsies were performed.   The mucosa of the upper esophagus appeared normal. STOMACH: The mucosa and folds of the stomach appeared normal. DUODENUM: The duodenal mucosa showed no abnormalities in the bulb and second portion of the duodenum.  Retroflexed views revealed a small hiatal hernia.  The scope was then withdrawn from the patient and the procedure completed.  COMPLICATIONS: There were no complications.  ENDOSCOPIC IMPRESSION: 1.   Esophagitis, severe; multiple biopsies 2.   Small hiatal hernia  RECOMMENDATIONS: 1.  Anti-reflux regimen 2.  Await pathology results 3.  PPI BID: pantoprazole 40 mg po bid, #60, 11 refills 4.  Carafate suspension 1 g tid, 1 month supply, 2 refills   eSigned:  Meryl Dare, MD, [facility] 02/10/2012 1:50 PM   cc:  Renaldo Reel, PA

## 2012-02-10 NOTE — Progress Notes (Signed)
Propofol given over incremental dosagesLidocaine-40mg IV prior to Propofol Induction 

## 2012-02-10 NOTE — Progress Notes (Signed)
Called to room to assist during endoscopic procedure.  Patient ID and intended procedure confirmed with present staff. Received instructions for my participation in the procedure from the performing physician.  

## 2012-02-10 NOTE — Progress Notes (Signed)
Lidocaine-40mg IV prior to Propofol Induction  

## 2012-02-11 ENCOUNTER — Telehealth: Payer: Self-pay

## 2012-02-11 NOTE — Telephone Encounter (Signed)
  Follow up Call-  Call back number 02/10/2012  Post procedure Call Back phone  # [phone]  Permission to leave phone message Yes     Patient questions:  Do you have a fever, pain , or abdominal swelling? no Pain Score  0 *  Have you tolerated food without any problems? yes  Have you been able to return to your normal activities? yes  Do you have any questions about your discharge instructions: Diet   no Medications  no Follow up visit  no  Do you have questions or concerns about your Care? no  Actions: * If pain score is 4 or above: No action needed, pain <4.

## 2012-02-12 ENCOUNTER — Encounter: Payer: Medicare Other | Admitting: Nutrition

## 2012-02-12 ENCOUNTER — Ambulatory Visit: Payer: Medicare Other

## 2012-02-12 ENCOUNTER — Other Ambulatory Visit: Payer: Medicare Other | Admitting: Lab

## 2012-02-16 ENCOUNTER — Ambulatory Visit: Payer: Medicare Other

## 2012-02-16 ENCOUNTER — Other Ambulatory Visit: Payer: Medicare Other | Admitting: Lab

## 2012-02-18 ENCOUNTER — Encounter: Payer: Self-pay | Admitting: Gastroenterology

## 2012-02-19 ENCOUNTER — Ambulatory Visit: Payer: Medicare Other

## 2012-02-19 ENCOUNTER — Other Ambulatory Visit: Payer: Medicare Other | Admitting: Lab

## 2012-02-23 ENCOUNTER — Encounter: Payer: Self-pay | Admitting: Internal Medicine

## 2012-02-23 ENCOUNTER — Ambulatory Visit (HOSPITAL_BASED_OUTPATIENT_CLINIC_OR_DEPARTMENT_OTHER): Payer: Medicare Other | Admitting: Internal Medicine

## 2012-02-23 ENCOUNTER — Other Ambulatory Visit (HOSPITAL_BASED_OUTPATIENT_CLINIC_OR_DEPARTMENT_OTHER): Payer: Medicare Other | Admitting: Lab

## 2012-02-23 ENCOUNTER — Ambulatory Visit: Payer: Medicare Other

## 2012-02-23 VITALS — BP 102/64 | HR 84 | Temp 97.7°F | Resp 20 | Ht 73.0 in | Wt 136.8 lb

## 2012-02-23 DIAGNOSIS — R112 Nausea with vomiting, unspecified: Secondary | ICD-10-CM

## 2012-02-23 DIAGNOSIS — C9000 Multiple myeloma not having achieved remission: Secondary | ICD-10-CM

## 2012-02-23 DIAGNOSIS — R066 Hiccough: Secondary | ICD-10-CM

## 2012-02-23 LAB — COMPREHENSIVE METABOLIC PANEL (CC13)
ALT: 9 U/L (ref 0–55)
BUN: 4.2 mg/dL — ABNORMAL LOW (ref 7.0–26.0)
CO2: 29 mEq/L (ref 22–29)
Calcium: 8.4 mg/dL (ref 8.4–10.4)
Creatinine: 0.8 mg/dL (ref 0.7–1.3)
Glucose: 120 mg/dl — ABNORMAL HIGH (ref 70–99)
Total Bilirubin: 1.34 mg/dL — ABNORMAL HIGH (ref 0.20–1.20)

## 2012-02-23 LAB — CBC WITH DIFFERENTIAL/PLATELET
BASO%: 0.9 % (ref 0.0–2.0)
Basophils Absolute: 0 10*3/uL (ref 0.0–0.1)
HCT: 27.1 % — ABNORMAL LOW (ref 38.4–49.9)
HGB: 8.8 g/dL — ABNORMAL LOW (ref 13.0–17.1)
LYMPH%: 19.5 % (ref 14.0–49.0)
MCH: 24 pg — ABNORMAL LOW (ref 27.2–33.4)
MCHC: 32.4 g/dL (ref 32.0–36.0)
MONO#: 0.4 10*3/uL (ref 0.1–0.9)
NEUT%: 58.4 % (ref 39.0–75.0)
Platelets: 161 10*3/uL (ref 140–400)
WBC: 2.4 10*3/uL — ABNORMAL LOW (ref 4.0–10.3)

## 2012-02-23 LAB — LACTATE DEHYDROGENASE (CC13): LDH: 253 U/L — ABNORMAL HIGH (ref 125–245)

## 2012-02-23 NOTE — Patient Instructions (Signed)
Your hiccups and nausea and vomiting are better. I will be waiting for the results of the myeloma panel to decide the next step in your treatment.

## 2012-02-23 NOTE — Progress Notes (Signed)
Per Dr Donnald Garre, K 2.7, pt needs to take daily x 7 days.  Pt stated he is already has K at home and will take daily x 7 days.

## 2012-02-23 NOTE — Progress Notes (Signed)
Quick Note:  Call patient with the result and order K Dur 40 meq po qd X 10 days. ______ 

## 2012-02-23 NOTE — Progress Notes (Signed)
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   PRIOR THERAPY: :  1. Status post 6 cycles of systemic chemotherapy with Revlimid and Decadron, last dose was given 07/21/2010 with very good response. 2. 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 Lance Bosch.  3. maintenance Revlimid at 10 mg by mouth daily status post 2 months. Therapy began 01/18/2011. 4. maintenance Revlimid at 15 mg by mouth daily with prophylactic dose Coumadin at 2 mg by mouth daily  CURRENT THERAPY:  1. Zometa 4 mg IV every 4 weeks.  2. Systemic chemotherapy with Velcade at 1.3 mg per meter squared given on days 1, 4, 8 and 11 and Doxil at 30 mg per meter square given on day 4 and Decadron 40 mg by mouth on weekly basis given every 3 weeks. Status post 4 cycles  INTERVAL HISTORY: Eddie Thomas 66 y.o. male returns to the clinic today for routine followup visit. The patient underwent upper endoscopy recently by Dr. Russella Dar and it showed severe esophagitis. He was started on Carafate. He is feeling a little bit better today with less nausea and vomiting as well as hiccups. The patient denied having any more weight loss. He has no chest pain, shortness breath, cough or hemoptysis. He had a myeloma panel performed earlier today.  MEDICAL HISTORY: Past Medical History  Diagnosis Date  . Multiple myeloma(203.0)   . Diabetes mellitus 07/03/2011  . Hypertension 07/03/2011  . Hyperlipidemia 07/03/2011  . Colon polyps 2012    ALLERGIES:   has no known allergies.  MEDICATIONS:  Current Outpatient Prescriptions  Medication Sig Dispense Refill  . AMBULATORY NON FORMULARY MEDICATION GI Cocktail Lidocaine and Maalox Take by mouth Every day      . baclofen (LIORESAL) 10  MG tablet Take 1 tablet (10 mg total) by mouth 3 (three) times daily.  30 each  0  . bortezomib IV (VELCADE) 3.5 MG injection Inject 1.4 mg into the vein once.      . chlorproMAZINE (THORAZINE) 25 MG tablet Take 25 mg by mouth 3 (three) times daily as needed. Hiccups      . dexamethasone (DECADRON) 4 MG tablet       . DOXOrubicin (ADRIAMYCIN) 50 MG chemo injection Inject 30 mg into the vein once.      . ezetimibe (ZETIA) 10 MG tablet Take 10 mg by mouth daily.        . haemophilus B polysaccharide conjugate vaccine (ACTHIB) injection Inject 0.5 mLs into the muscle once.  1 each  0  . lidocaine-prilocaine (EMLA) cream       . lisinopril (PRINIVIL,ZESTRIL) 10 MG tablet Take 10 mg by mouth daily.        [name] LORazepam (ATIVAN) 1 MG tablet Take 1 mg by mouth every 8 (eight) hours as needed. Hiccups      . metFORMIN (GLUCOPHAGE) 500 MG tablet Take 500 mg by mouth daily with breakfast.       . ondansetron (ZOFRAN) 8 MG tablet Take 8 mg by mouth every 8 (eight) hours as needed. Nausea      . pantoprazole (PROTONIX) 40 MG tablet Take 1 tablet (40 mg total) by mouth daily. Take 30 minutes to 1 hour before 1st meal of the day.  30  tablet  1  . pantoprazole (PROTONIX) 40 MG tablet Take 1 tablet (40 mg total) by mouth 2 (two) times daily.  60 tablet  11  . potassium chloride SA (K-DUR,KLOR-CON) 20 MEQ tablet Take 20 mEq by mouth daily.      . prochlorperazine (COMPAZINE) 10 MG tablet Take 10 mg by mouth every 6 (six) hours as needed. Nausea      . promethazine (PHENERGAN) 25 MG suppository Place 25 mg rectally every 6 (six) hours as needed.      . simvastatin (ZOCOR) 10 MG tablet Take 10 mg by mouth at bedtime.        . sucralfate (CARAFATE) 1 GM/10ML suspension carafate 1g tid, 1 month supply, 2 refills  420 mL  2  . valACYclovir (VALTREX) 500 MG tablet Take 1 tablet (500 mg total) by mouth daily.  30 tablet  3    SURGICAL HISTORY:  Past Surgical History  Procedure Date  . Limbal stem cell transplant   .  Humerus fracture surgery     right    REVIEW OF SYSTEMS:  A comprehensive review of systems was negative.   PHYSICAL EXAMINATION: General appearance: alert, cooperative and no distress Head: Normocephalic, without obvious abnormality, atraumatic Neck: no adenopathy Lymph nodes: Cervical, supraclavicular, and axillary nodes normal. Resp: clear to auscultation bilaterally Cardio: regular rate and rhythm, S1, S2 normal, no murmur, click, rub or gallop GI: soft, non-tender; bowel sounds normal; no masses,  no organomegaly Extremities: extremities normal, atraumatic, no cyanosis or edema  ECOG PERFORMANCE STATUS: 1 - Symptomatic but completely ambulatory  Blood pressure 102/64, pulse 84, temperature 97.7 F (36.5 C), temperature source Oral, resp. rate 20, height 6\' 1"  (1.854 m), weight 136 lb 12.8 oz (62.052 kg).  LABORATORY DATA: Lab Results  Component Value Date   WBC 7.3 02/04/2012   HGB 10.3* 02/04/2012   HCT 30.5* 02/04/2012   MCV 72.6* 02/04/2012   PLT 247 02/04/2012      Chemistry      Component Value Date/Time   NA 138 02/09/2012 1205   NA 137 02/04/2012 2331   K 3.2* 02/09/2012 1205   K 3.8 02/04/2012 2331   CL 97* 02/09/2012 1205   CL 91* 02/04/2012 2331   CO2 32* 02/09/2012 1205   CO2 34* 02/04/2012 2331   BUN 12.0 02/09/2012 1205   BUN 28* 02/04/2012 2331   CREATININE 0.8 02/09/2012 1205   CREATININE 1.10 02/04/2012 2331      Component Value Date/Time   CALCIUM 8.9 02/09/2012 1205   CALCIUM 9.5 02/04/2012 2331   ALKPHOS 66 02/09/2012 1205   ALKPHOS 71 02/04/2012 2331   AST 15 02/09/2012 1205   AST 18 02/04/2012 2331   ALT 13 02/09/2012 1205   ALT 16 02/04/2012 2331   BILITOT 1.30* 02/09/2012 1205   BILITOT 1.3* 02/04/2012 2331       RADIOGRAPHIC STUDIES: No results found.  ASSESSMENT: This is a very pleasant 67 years old African American male with recurrent multiple myeloma most recently treated with Velcade, Doxil and Decadron but the patient had significant adverse  effects with intractable nausea and vomiting as well as hiccups. His treatment is currently on hold.   PLAN: I will order myeloma panel today to evaluate his disease and the response to the previous chemotherapy. Depending on the results of the melanoma pending I may keep the patient on observation or consider him for treatment with single agent Velcade, Revlimid or Pomalyst. I will call the patient with  results and treatment plan in the next few days.  All questions were answered. The patient knows to call the clinic with any problems, questions or concerns. We can certainly see the patient much sooner if necessary.

## 2012-02-24 LAB — KAPPA/LAMBDA LIGHT CHAINS
Kappa free light chain: 0.03 mg/dL — ABNORMAL LOW (ref 0.33–1.94)
Kappa:Lambda Ratio: 0.01 — ABNORMAL LOW (ref 0.26–1.65)
Lambda Free Lght Chn: 2.32 mg/dL (ref 0.57–2.63)

## 2012-02-24 LAB — BETA 2 MICROGLOBULIN, SERUM: Beta-2 Microglobulin: 1.6 mg/L (ref 1.01–1.73)

## 2012-02-24 NOTE — Progress Notes (Signed)
Quick Note:  Call patient with the result and we can see him back in 2 months with repeat CBC, CMET, LD and Myeloma panel ______

## 2012-02-25 ENCOUNTER — Telehealth: Payer: Self-pay | Admitting: *Deleted

## 2012-02-25 DIAGNOSIS — E876 Hypokalemia: Secondary | ICD-10-CM

## 2012-02-25 DIAGNOSIS — C9000 Multiple myeloma not having achieved remission: Secondary | ICD-10-CM

## 2012-02-25 MED ORDER — POTASSIUM CHLORIDE CRYS ER 20 MEQ PO TBCR: 20.0000 meq | EXTENDED_RELEASE_TABLET | ORAL | Status: DC

## 2012-02-25 NOTE — Telephone Encounter (Signed)
Message copied by Caren Griffins on Wed Feb 25, 2012 11:07 AM ------      Message from: Si Gaul      Created: Tue Feb 24, 2012  6:01 PM       Call patient with the result and we can see him back in 2 months with repeat CBC, CMET, LD and Myeloma panel

## 2012-02-25 NOTE — Telephone Encounter (Signed)
Pt is aware to come back in in 2 months with a myeloma panel and other lab work.  He also is aware to double up on his potassium supplements x 10days.    Pt will also still come in for zometa monthly.  Onc tx schedule filled out to make these appts along with a f/u in 2 months.  SLJ

## 2012-02-26 ENCOUNTER — Telehealth: Payer: Self-pay | Admitting: Internal Medicine

## 2012-02-26 ENCOUNTER — Telehealth: Payer: Self-pay | Admitting: *Deleted

## 2012-02-26 ENCOUNTER — Ambulatory Visit (HOSPITAL_BASED_OUTPATIENT_CLINIC_OR_DEPARTMENT_OTHER): Payer: Medicare Other

## 2012-02-26 ENCOUNTER — Ambulatory Visit: Payer: Medicare Other | Admitting: Nutrition

## 2012-02-26 ENCOUNTER — Telehealth: Payer: Self-pay | Admitting: Medical Oncology

## 2012-02-26 VITALS — BP 134/77 | HR 82 | Temp 97.4°F

## 2012-02-26 DIAGNOSIS — C9000 Multiple myeloma not having achieved remission: Secondary | ICD-10-CM

## 2012-02-26 MED ORDER — SODIUM CHLORIDE 0.9 % IV SOLN
Freq: Once | INTRAVENOUS | Status: AC
  Administered 2012-02-26: 10:00:00 via INTRAVENOUS

## 2012-02-26 MED ORDER — ZOLEDRONIC ACID 4 MG/100ML IV SOLN
4.0000 mg | Freq: Once | INTRAVENOUS | Status: AC
  Administered 2012-02-26: 4 mg via INTRAVENOUS
  Filled 2012-02-26: qty 100

## 2012-02-26 MED ORDER — ZOLEDRONIC ACID 4 MG/5ML IV CONC: 4.0000 mg | Freq: Once | INTRAVENOUS | Status: DC

## 2012-02-26 NOTE — Telephone Encounter (Signed)
Per call from Darl Pikes in radiation I have scheduled appt for Friday. JMW

## 2012-02-26 NOTE — Patient Instructions (Signed)
Call MD for questions or concerns 

## 2012-02-26 NOTE — Telephone Encounter (Signed)
Per staff message and POF I have scheduled appts.  JMW  

## 2012-02-26 NOTE — Telephone Encounter (Signed)
Per Adrena I called pt with Myeloma results and Judeth Cornfield had already called pt.

## 2012-02-26 NOTE — Telephone Encounter (Signed)
gv and printed app schedule for pt for March and April...emailed michelle to add t

## 2012-02-26 NOTE — Progress Notes (Signed)
Spoke with patient during chemotherapy. Patient reports improved esophagitis. He reports constipation. He has had severe weight loss of 20% of his original body weight in the last 4 months primarily due to esophagitis, hiccups, and vomiting. He is tolerating Ensure Plus. He states he is trying to eat more soft foods now.  Nutrition diagnosis: Food and nutrition related knowledge deficit and inadequate oral intake both continue.  Patient meets criteria for severe malnutrition in the context of chronic illness secondary to greater than 7-1/2% weight loss in 3 months and less than 75% of estimated energy requirements for one month. In addition, patient has depletion of muscle and fat on physical exam.  Intervention: I educated patient to increase oral intake throughout the day choosing higher calorie, higher protein foods. I have encouraged and recommended Ensure Plus 3 times a day between meals. I provided additional coupons for him. I've also educated him on strategies for improving constipation and provided a fact sheet. Teach back method used.  Monitoring, evaluation, goals: Patient will tolerate increased oral intake to minimize any further weight loss. He will tolerate Ensure Plus 3 times a day.  Next visit: Patient will contact me with questions or concerns.

## 2012-03-01 ENCOUNTER — Other Ambulatory Visit: Payer: Medicare Other | Admitting: Lab

## 2012-03-01 ENCOUNTER — Ambulatory Visit: Payer: Medicare Other

## 2012-03-01 ENCOUNTER — Ambulatory Visit (INDEPENDENT_AMBULATORY_CARE_PROVIDER_SITE_OTHER): Payer: Medicare Other | Admitting: Gastroenterology

## 2012-03-01 ENCOUNTER — Encounter: Payer: Self-pay | Admitting: Gastroenterology

## 2012-03-01 VITALS — BP 114/68 | HR 92 | Ht 70.5 in | Wt 143.4 lb

## 2012-03-01 DIAGNOSIS — K2100 Gastro-esophageal reflux disease with esophagitis, without bleeding: Secondary | ICD-10-CM

## 2012-03-01 DIAGNOSIS — R1319 Other dysphagia: Secondary | ICD-10-CM

## 2012-03-01 NOTE — Patient Instructions (Signed)
Continue Protonix one tablet by mouth twice daily.   Change your Carafate to as needed.  Patient advised to avoid spicy, acidic, citrus, chocolate, mints, fruit and fruit juices.  Limit the intake of caffeine, alcohol and Soda.  Don't exercise too soon after eating.  Don't lie down within 3-4 hours of eating.  Elevate the head of your bed.  Thank you for choosing me and [facility].  Venita Lick. Pleas Koch., MD., Clementeen Graham  cc: [name], MD

## 2012-03-01 NOTE — Progress Notes (Signed)
History of Present Illness: This is a 67 year old male who returns for followup of severe esophagitis with pickups. His hiccups abated and he has discontinued using Ativan and baclofen. His nausea and vomiting has abated. His appetite is very good. He notes occasional sensation of something stuck in the back of his throat and swallowing and at other times. He denies difficulty swallowing solid foods.  Current Medications, Allergies, Past Medical History, Past Surgical History, Family History and Social History were reviewed in [employer] record.  Physical Exam: General: Well developed , well nourished, no acute distress Head: Normocephalic and atraumatic Eyes:  sclerae anicteric, EOMI Ears: Normal auditory acuity Mouth: No deformity or lesions Lungs: Clear throughout to auscultation Heart: Regular rate and rhythm; no murmurs, rubs or bruits Abdomen: Soft, non tender and non distended. No masses, hepatosplenomegaly or hernias noted. Normal Bowel sounds Musculoskeletal: Symmetrical with no gross deformities  Pulses:  Normal pulses noted Extremities: No clubbing, cyanosis, edema or deformities noted Neurological: Alert oriented x 4, grossly nonfocal Psychological:  Alert and cooperative. Normal mood and affect  Assessment and Recommendations:  1. GERD with severe esophagitis. Globus sensation persists. Hiccups have resolved and he has discontinued Ativan and baclofen. Nausea and vomiting have resolved. His appetite has returned. Change Carafate to 3 times a day when necessary. Continue all standard antireflux measures and pantoprazole 40 mg twice a day long-term. Return office visit 3 months.  2. Multiple myeloma.

## 2012-03-04 ENCOUNTER — Ambulatory Visit: Payer: Medicare Other

## 2012-03-15 ENCOUNTER — Other Ambulatory Visit: Payer: Medicare Other | Admitting: Lab

## 2012-03-25 ENCOUNTER — Ambulatory Visit: Payer: Medicare Other | Admitting: Nutrition

## 2012-03-25 ENCOUNTER — Other Ambulatory Visit (HOSPITAL_BASED_OUTPATIENT_CLINIC_OR_DEPARTMENT_OTHER): Payer: Medicare Other | Admitting: Lab

## 2012-03-25 ENCOUNTER — Ambulatory Visit (HOSPITAL_BASED_OUTPATIENT_CLINIC_OR_DEPARTMENT_OTHER): Payer: Medicare Other

## 2012-03-25 VITALS — BP 141/88 | HR 60 | Temp 97.0°F | Resp 20

## 2012-03-25 DIAGNOSIS — C9000 Multiple myeloma not having achieved remission: Secondary | ICD-10-CM

## 2012-03-25 LAB — COMPREHENSIVE METABOLIC PANEL (CC13)
ALT: 10 U/L (ref 0–55)
Albumin: 3.7 g/dL (ref 3.5–5.0)
Alkaline Phosphatase: 63 U/L (ref 40–150)
CO2: 27 mEq/L (ref 22–29)
Glucose: 119 mg/dl — ABNORMAL HIGH (ref 70–99)
Potassium: 3.5 mEq/L (ref 3.5–5.1)
Sodium: 142 mEq/L (ref 136–145)
Total Protein: 6.5 g/dL (ref 6.4–8.3)

## 2012-03-25 LAB — CBC WITH DIFFERENTIAL/PLATELET
Basophils Absolute: 0 10*3/uL (ref 0.0–0.1)
EOS%: 2 % (ref 0.0–7.0)
Eosinophils Absolute: 0.1 10*3/uL (ref 0.0–0.5)
HGB: 9.5 g/dL — ABNORMAL LOW (ref 13.0–17.1)
NEUT#: 3.2 10*3/uL (ref 1.5–6.5)
RBC: 4.04 10*6/uL — ABNORMAL LOW (ref 4.20–5.82)
RDW: 15.5 % — ABNORMAL HIGH (ref 11.0–14.6)
lymph#: 0.9 10*3/uL (ref 0.9–3.3)
nRBC: 0 % (ref 0–0)

## 2012-03-25 MED ORDER — ZOLEDRONIC ACID 4 MG/100ML IV SOLN
4.0000 mg | Freq: Once | INTRAVENOUS | Status: AC
  Administered 2012-03-25: 4 mg via INTRAVENOUS
  Filled 2012-03-25: qty 100

## 2012-03-25 MED ORDER — ZOLEDRONIC ACID 4 MG/5ML IV CONC: 4.0000 mg | Freq: Once | INTRAVENOUS | Status: DC

## 2012-03-25 MED ORDER — SODIUM CHLORIDE 0.9 % IV SOLN
Freq: Once | INTRAVENOUS | Status: AC
  Administered 2012-03-25: 10:00:00 via INTRAVENOUS

## 2012-03-25 NOTE — Patient Instructions (Addendum)
Zoledronic Acid injection (Hypercalcemia, Oncology) What is this medicine? ZOLEDRONIC ACID (ZOE le dron ik AS id) lowers the amount of calcium loss from bone. It is used to treat too much calcium in your blood from cancer. It is also used to prevent complications of cancer that has spread to the bone. This medicine may be used for other purposes; ask your health care provider or pharmacist if you have questions. What should I tell my health care provider before I take this medicine? They need to know if you have any of these conditions: -aspirin-sensitive asthma -dental disease -kidney disease -an unusual or allergic reaction to zoledronic acid, other medicines, foods, dyes, or preservatives -pregnant or trying to get pregnant -breast-feeding How should I use this medicine? This medicine is for infusion into a vein. It is given by a health care professional in a hospital or clinic setting. Talk to your pediatrician regarding the use of this medicine in children. Special care may be needed. Overdosage: If you think you have taken too much of this medicine contact a poison control center or emergency room at once. NOTE: This medicine is only for you. Do not share this medicine with others. What if I miss a dose? It is important not to miss your dose. Call your doctor or health care professional if you are unable to keep an appointment. What may interact with this medicine? -certain antibiotics given by injection -NSAIDs, medicines for pain and inflammation, like ibuprofen or naproxen -some diuretics like bumetanide, furosemide -teriparatide -thalidomide This list may not describe all possible interactions. Give your health care provider a list of all the medicines, herbs, non-prescription drugs, or dietary supplements you use. Also tell them if you smoke, drink alcohol, or use illegal drugs. Some items may interact with your medicine. What should I watch for while using this medicine? Visit  your doctor or health care professional for regular checkups. It may be some time before you see the benefit from this medicine. Do not stop taking your medicine unless your doctor tells you to. Your doctor may order blood tests or other tests to see how you are doing. Women should inform their doctor if they wish to become pregnant or think they might be pregnant. There is a potential for serious side effects to an unborn child. Talk to your health care professional or pharmacist for more information. You should make sure that you get enough calcium and vitamin D while you are taking this medicine. Discuss the foods you eat and the vitamins you take with your health care professional. Some people who take this medicine have severe bone, joint, and/or muscle pain. This medicine may also increase your risk for a broken thigh bone. Tell your doctor right away if you have pain in your upper leg or groin. Tell your doctor if you have any pain that does not go away or that gets worse. What side effects may I notice from receiving this medicine? Side effects that you should report to your doctor or health care professional as soon as possible: -allergic reactions like skin rash, itching or hives, swelling of the face, lips, or tongue -anxiety, confusion, or depression -breathing problems -changes in vision -feeling faint or lightheaded, falls -jaw burning, cramping, pain -muscle cramps, stiffness, or weakness -trouble passing urine or change in the amount of urine Side effects that usually do not require medical attention (report to your doctor or health care professional if they continue or are bothersome): -bone, joint, or muscle pain -  fever -hair loss -irritation at site where injected -loss of appetite -nausea, vomiting -stomach upset -tired This list may not describe all possible side effects. Call your doctor for medical advice about side effects. You may report side effects to FDA at  1-800-FDA-1088. Where should I keep my medicine? This drug is given in a hospital or clinic and will not be stored at home. NOTE: This sheet is a summary. It may not cover all possible information. If you have questions about this medicine, talk to your doctor, pharmacist, or health care provider.  2013, [employer]. (07/05/2010 9:06:58 AM)  

## 2012-03-25 NOTE — Progress Notes (Signed)
Spoke briefly with patient today in the chemotherapy room. Patient reports that his appetite is good. His nausea and vomiting and hiccups have improved. His weight has increased to 143.3 pounds documented February 10. He has reduced his intake of Ensure Plus to one time a day. He is eating more regular foods.  Nutrition diagnosis: Food and nutrition related knowledge deficit and inadequate oral intake have improved.  Intervention: Patient was educated to continue Ensure Plus oral nutrition supplement as needed to promote weight gain/maintenance. Patient was educated on strategies for easier swallowing. Teach back method used.  Monitoring, evaluation, goals: Patient will tolerate continued oral intake to minimize further weight loss.  Next visit: Thursday, April 3, during chemotherapy.

## 2012-04-15 ENCOUNTER — Other Ambulatory Visit: Payer: Self-pay | Admitting: *Deleted

## 2012-04-15 ENCOUNTER — Other Ambulatory Visit (HOSPITAL_BASED_OUTPATIENT_CLINIC_OR_DEPARTMENT_OTHER): Payer: Medicare Other

## 2012-04-15 DIAGNOSIS — C9000 Multiple myeloma not having achieved remission: Secondary | ICD-10-CM

## 2012-04-15 LAB — CBC WITH DIFFERENTIAL/PLATELET
Basophils Absolute: 0 10*3/uL (ref 0.0–0.1)
Eosinophils Absolute: 0.1 10*3/uL (ref 0.0–0.5)
HGB: 9.9 g/dL — ABNORMAL LOW (ref 13.0–17.1)
NEUT#: 2.5 10*3/uL (ref 1.5–6.5)
RDW: 15.6 % — ABNORMAL HIGH (ref 11.0–14.6)
lymph#: 0.8 10*3/uL — ABNORMAL LOW (ref 0.9–3.3)

## 2012-04-15 LAB — COMPREHENSIVE METABOLIC PANEL (CC13)
AST: 19 U/L (ref 5–34)
Albumin: 3.7 g/dL (ref 3.5–5.0)
BUN: 6.9 mg/dL — ABNORMAL LOW (ref 7.0–26.0)
CO2: 27 mEq/L (ref 22–29)
Calcium: 9.1 mg/dL (ref 8.4–10.4)
Chloride: 104 mEq/L (ref 98–107)
Glucose: 142 mg/dl — ABNORMAL HIGH (ref 70–99)
Potassium: 3.6 mEq/L (ref 3.5–5.1)

## 2012-04-19 LAB — KAPPA/LAMBDA LIGHT CHAINS
Kappa:Lambda Ratio: 0.01 — ABNORMAL LOW (ref 0.26–1.65)
Lambda Free Lght Chn: 4.1 mg/dL — ABNORMAL HIGH (ref 0.57–2.63)

## 2012-04-19 LAB — SPEP & IFE WITH QIG
Albumin ELP: 64.3 % (ref 55.8–66.1)
Alpha-1-Globulin: 4.8 % (ref 2.9–4.9)
Beta 2: 8.2 % — ABNORMAL HIGH (ref 3.2–6.5)
IgM, Serum: 8 mg/dL — ABNORMAL LOW (ref 41–251)

## 2012-04-22 ENCOUNTER — Telehealth: Payer: Self-pay | Admitting: Internal Medicine

## 2012-04-22 ENCOUNTER — Ambulatory Visit (HOSPITAL_BASED_OUTPATIENT_CLINIC_OR_DEPARTMENT_OTHER): Payer: Medicare Other

## 2012-04-22 ENCOUNTER — Ambulatory Visit: Payer: Medicare Other | Admitting: Nutrition

## 2012-04-22 ENCOUNTER — Ambulatory Visit (HOSPITAL_BASED_OUTPATIENT_CLINIC_OR_DEPARTMENT_OTHER): Payer: Medicare Other | Admitting: Internal Medicine

## 2012-04-22 ENCOUNTER — Encounter: Payer: Self-pay | Admitting: Internal Medicine

## 2012-04-22 VITALS — BP 138/77 | HR 75 | Temp 97.6°F | Resp 20 | Ht 70.5 in | Wt 155.3 lb

## 2012-04-22 DIAGNOSIS — C9000 Multiple myeloma not having achieved remission: Secondary | ICD-10-CM

## 2012-04-22 DIAGNOSIS — G609 Hereditary and idiopathic neuropathy, unspecified: Secondary | ICD-10-CM

## 2012-04-22 MED ORDER — ZOLEDRONIC ACID 4 MG/100ML IV SOLN
4.0000 mg | Freq: Once | INTRAVENOUS | Status: AC
  Administered 2012-04-22: 4 mg via INTRAVENOUS
  Filled 2012-04-22: qty 100

## 2012-04-22 NOTE — Progress Notes (Signed)
[facility] [phone]   [phone]  OFFICE PROGRESS NOTE  [name] [address] 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   PRIOR THERAPY: :  1. Status post 6 cycles of systemic chemotherapy with Revlimid and Decadron, last dose was given 07/21/2010 with very good response. 2. 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 Lance Bosch.  3. maintenance Revlimid at 10 mg by mouth daily status post 2 months. Therapy began 01/18/2011. 4. maintenance Revlimid at 15 mg by mouth daily with prophylactic dose Coumadin at 2 mg by mouth daily. 5. Systemic chemotherapy with Velcade at 1.3 mg per meter squared given on days 1, 4, 8 and 11 and Doxil at 30 mg per meter square given on day 4 and Decadron 40 mg by mouth on weekly basis given every 3 weeks. Status post 4 cycles  CURRENT THERAPY:  Zometa 4 mg IV every 4 weeks.    INTERVAL HISTORY: Eddie Thomas 67 y.o. male returns to the clinic today for routine follow up visit. The patient is feeling fine today with no specific complaints except for mild peripheral neuropathy especially in the feet. He denied having any significant fatigue or weakness. He denied having any chest pain, shortness breath, cough or hemoptysis. He denied having any weight loss or night sweats. The patient had repeat myeloma panel and he is here today for evaluation and discussion of his lab results.  MEDICAL HISTORY: Past Medical History  Diagnosis Date  . Multiple myeloma(203.0)   . Diabetes mellitus 07/03/2011  . Hypertension 07/03/2011  . Hyperlipidemia 07/03/2011  . Colon polyps 2012  . Gastric ulcer     ALLERGIES:  has No Known Allergies.  MEDICATIONS:  Current Outpatient Prescriptions  Medication Sig Dispense Refill  . AMBULATORY NON FORMULARY MEDICATION GI Cocktail Lidocaine and  Maalox Take by mouth Every day      . ezetimibe (ZETIA) 10 MG tablet Take 10 mg by mouth daily.        [name] lisinopril (PRINIVIL,ZESTRIL) 10 MG tablet Take 10 mg by mouth daily.        . metFORMIN (GLUCOPHAGE) 500 MG tablet Take 500 mg by mouth daily with breakfast.       . pantoprazole (PROTONIX) 40 MG tablet Take 1 tablet (40 mg total) by mouth 2 (two) times daily.  60 tablet  11  . simvastatin (ZOCOR) 10 MG tablet Take 10 mg by mouth at bedtime.        . sucralfate (CARAFATE) 1 GM/10ML suspension Take 1 g by mouth 4 (four) times daily.      . haemophilus B polysaccharide conjugate vaccine (ACTHIB) injection Inject 0.5 mLs into the muscle once.  1 each  0  . valACYclovir (VALTREX) 500 MG tablet Take 1 tablet (500 mg total) by mouth daily.  30 tablet  3   No current facility-administered medications for this visit.    SURGICAL HISTORY:  Past Surgical History  Procedure Laterality Date  . Limbal stem cell transplant    . Humerus fracture surgery      right    REVIEW OF SYSTEMS:  A comprehensive review of systems was negative except for: Neurological: positive for paresthesia   PHYSICAL EXAMINATION: General appearance: alert, cooperative and no distress Head: Normocephalic, without obvious abnormality, atraumatic Neck: no adenopathy Resp: clear to auscultation bilaterally Cardio: regular rate  and rhythm, S1, S2 normal, no murmur, click, rub or gallop GI: soft, non-tender; bowel sounds normal; no masses,  no organomegaly Extremities: extremities normal, atraumatic, no cyanosis or edema  ECOG PERFORMANCE STATUS: 1 - Symptomatic but completely ambulatory  Blood pressure 138/77, pulse 75, temperature 97.6 F (36.4 C), temperature source Oral, resp. rate 20, height 5' 10.5" (1.791 m), weight 155 lb 4.8 oz (70.444 kg).  LABORATORY DATA: Lab Results  Component Value Date   WBC 3.8* 04/15/2012   HGB 9.9* 04/15/2012   HCT 30.8* 04/15/2012   MCV 75.1* 04/15/2012   PLT 154 04/15/2012       Chemistry      Component Value Date/Time   NA 140 04/15/2012 0849   NA 137 02/04/2012 2331   K 3.6 04/15/2012 0849   K 3.8 02/04/2012 2331   CL 104 04/15/2012 0849   CL 91* 02/04/2012 2331   CO2 27 04/15/2012 0849   CO2 34* 02/04/2012 2331   BUN 6.9* 04/15/2012 0849   BUN 28* 02/04/2012 2331   CREATININE 0.8 04/15/2012 0849   CREATININE 1.10 02/04/2012 2331      Component Value Date/Time   CALCIUM 9.1 04/15/2012 0849   CALCIUM 9.5 02/04/2012 2331   ALKPHOS 52 04/15/2012 0849   ALKPHOS 71 02/04/2012 2331   AST 19 04/15/2012 0849   AST 18 02/04/2012 2331   ALT 18 04/15/2012 0849   ALT 16 02/04/2012 2331   BILITOT 1.15 04/15/2012 0849   BILITOT 1.3* 02/04/2012 2331       RADIOGRAPHIC STUDIES: No results found.  ASSESSMENT: this is a very pleasant 67 years old [place] American male with history of multiple myeloma status post systemic chemotherapy with Revlimid and Decadron as well as peripheral blood autologous stem cell transplant followed by maintenance Revlimid and then treatment with Velcade secondary to disease recurrence. He has some intolerance issue to Velcade and currently on observation. He has no significant evidence for disease progression on his recent myeloma panel but increase in the free lambda light chain.   PLAN: I discussed the lab result with the patient today. I recommended for him to continue on observation with repeat myeloma panel in 2 months. If the patient has evidence for further disease progression on the upcoming lab, I would consider him for treatment with either Pomalyst or Kyprolis. He was advised to call immediately if he has any concerning symptoms in the interval.   All questions were answered. The patient knows to call the clinic with any problems, questions or concerns. We can certainly see the patient much sooner if necessary.

## 2012-04-22 NOTE — Patient Instructions (Addendum)
Zoledronic Acid injection (Hypercalcemia, Oncology) What is this medicine? ZOLEDRONIC ACID (ZOE le dron ik AS id) lowers the amount of calcium loss from bone. It is used to treat too much calcium in your blood from cancer. It is also used to prevent complications of cancer that has spread to the bone. This medicine may be used for other purposes; ask your health care provider or pharmacist if you have questions. What should I tell my health care provider before I take this medicine? They need to know if you have any of these conditions: -aspirin-sensitive asthma -dental disease -kidney disease -an unusual or allergic reaction to zoledronic acid, other medicines, foods, dyes, or preservatives -pregnant or trying to get pregnant -breast-feeding How should I use this medicine? This medicine is for infusion into a vein. It is given by a health care professional in a hospital or clinic setting. Talk to your pediatrician regarding the use of this medicine in children. Special care may be needed. Overdosage: If you think you have taken too much of this medicine contact a poison control center or emergency room at once. NOTE: This medicine is only for you. Do not share this medicine with others. What if I miss a dose? It is important not to miss your dose. Call your doctor or health care professional if you are unable to keep an appointment. What may interact with this medicine? -certain antibiotics given by injection -NSAIDs, medicines for pain and inflammation, like ibuprofen or naproxen -some diuretics like bumetanide, furosemide -teriparatide -thalidomide This list may not describe all possible interactions. Give your health care provider a list of all the medicines, herbs, non-prescription drugs, or dietary supplements you use. Also tell them if you smoke, drink alcohol, or use illegal drugs. Some items may interact with your medicine. What should I watch for while using this medicine? Visit  your doctor or health care professional for regular checkups. It may be some time before you see the benefit from this medicine. Do not stop taking your medicine unless your doctor tells you to. Your doctor may order blood tests or other tests to see how you are doing. Women should inform their doctor if they wish to become pregnant or think they might be pregnant. There is a potential for serious side effects to an unborn child. Talk to your health care professional or pharmacist for more information. You should make sure that you get enough calcium and vitamin D while you are taking this medicine. Discuss the foods you eat and the vitamins you take with your health care professional. Some people who take this medicine have severe bone, joint, and/or muscle pain. This medicine may also increase your risk for a broken thigh bone. Tell your doctor right away if you have pain in your upper leg or groin. Tell your doctor if you have any pain that does not go away or that gets worse. What side effects may I notice from receiving this medicine? Side effects that you should report to your doctor or health care professional as soon as possible: -allergic reactions like skin rash, itching or hives, swelling of the face, lips, or tongue -anxiety, confusion, or depression -breathing problems -changes in vision -feeling faint or lightheaded, falls -jaw burning, cramping, pain -muscle cramps, stiffness, or weakness -trouble passing urine or change in the amount of urine Side effects that usually do not require medical attention (report to your doctor or health care professional if they continue or are bothersome): -bone, joint, or muscle pain -  fever -hair loss -irritation at site where injected -loss of appetite -nausea, vomiting -stomach upset -tired This list may not describe all possible side effects. Call your doctor for medical advice about side effects. You may report side effects to FDA at  1-800-FDA-1088. Where should I keep my medicine? This drug is given in a hospital or clinic and will not be stored at home. NOTE: This sheet is a summary. It may not cover all possible information. If you have questions about this medicine, talk to your doctor, pharmacist, or health care provider.  2013, [employer]. (07/05/2010 9:06:58 AM)  

## 2012-04-22 NOTE — Telephone Encounter (Signed)
Gave pt appt for may and June 2014 ,lab , chemo and MD, Dr.Mohamed will be seeing pt 2 months from today per MD

## 2012-04-22 NOTE — Progress Notes (Signed)
Brief followup with patient today in the chemotherapy room. Patient denies nutrition side effects. Nausea and vomiting and hiccups have resolved. His weight has increased to 155.3 pounds from 143.3 pounds February 10.  Diagnosis of food and nutrition related knowledge deficit and inadequate oral intake have resolved.  I have encouraged patient to contact me if he develops further problems or difficulties eating. Patient is agreeable. No followup scheduled at this time.

## 2012-04-24 NOTE — Patient Instructions (Signed)
There is mild increase in the free lambda light chain. Follow up visit in 2 months with repeat myeloma panel

## 2012-05-20 ENCOUNTER — Ambulatory Visit: Payer: Medicare Other | Admitting: Internal Medicine

## 2012-05-20 ENCOUNTER — Ambulatory Visit (HOSPITAL_BASED_OUTPATIENT_CLINIC_OR_DEPARTMENT_OTHER): Payer: Medicare Other

## 2012-05-20 ENCOUNTER — Other Ambulatory Visit (HOSPITAL_BASED_OUTPATIENT_CLINIC_OR_DEPARTMENT_OTHER): Payer: Medicare Other | Admitting: Lab

## 2012-05-20 VITALS — BP 147/74 | HR 54 | Temp 97.8°F

## 2012-05-20 DIAGNOSIS — C9000 Multiple myeloma not having achieved remission: Secondary | ICD-10-CM

## 2012-05-20 LAB — CBC WITH DIFFERENTIAL/PLATELET
BASO%: 0.4 % (ref 0.0–2.0)
EOS%: 2.4 % (ref 0.0–7.0)
HCT: 34.4 % — ABNORMAL LOW (ref 38.4–49.9)
MCH: 23.3 pg — ABNORMAL LOW (ref 27.2–33.4)
MCHC: 31.4 g/dL — ABNORMAL LOW (ref 32.0–36.0)
MONO#: 0.7 10*3/uL (ref 0.1–0.9)
NEUT%: 57.3 % (ref 39.0–75.0)
RBC: 4.64 10*6/uL (ref 4.20–5.82)
RDW: 14 % (ref 11.0–14.6)
WBC: 5.3 10*3/uL (ref 4.0–10.3)
lymph#: 1.4 10*3/uL (ref 0.9–3.3)

## 2012-05-20 LAB — COMPREHENSIVE METABOLIC PANEL (CC13)
ALT: 53 U/L (ref 0–55)
AST: 40 U/L — ABNORMAL HIGH (ref 5–34)
Albumin: 3.8 g/dL (ref 3.5–5.0)
CO2: 29 mEq/L (ref 22–29)
Calcium: 9.1 mg/dL (ref 8.4–10.4)
Chloride: 103 mEq/L (ref 98–107)
Potassium: 4.1 mEq/L (ref 3.5–5.1)
Sodium: 138 mEq/L (ref 136–145)
Total Protein: 7 g/dL (ref 6.4–8.3)

## 2012-05-20 MED ORDER — ZOLEDRONIC ACID 4 MG/100ML IV SOLN
4.0000 mg | Freq: Once | INTRAVENOUS | Status: AC
  Administered 2012-05-20: 4 mg via INTRAVENOUS
  Filled 2012-05-20: qty 100

## 2012-05-20 NOTE — Patient Instructions (Addendum)
Zoledronic Acid injection (Hypercalcemia, Oncology) What is this medicine? ZOLEDRONIC ACID (ZOE le dron ik AS id) lowers the amount of calcium loss from bone. It is used to treat too much calcium in your blood from cancer. It is also used to prevent complications of cancer that has spread to the bone. This medicine may be used for other purposes; ask your health care provider or pharmacist if you have questions. What should I tell my health care provider before I take this medicine? They need to know if you have any of these conditions: -aspirin-sensitive asthma -dental disease -kidney disease -an unusual or allergic reaction to zoledronic acid, other medicines, foods, dyes, or preservatives -pregnant or trying to get pregnant -breast-feeding How should I use this medicine? This medicine is for infusion into a vein. It is given by a health care professional in a hospital or clinic setting. Talk to your pediatrician regarding the use of this medicine in children. Special care may be needed. Overdosage: If you think you have taken too much of this medicine contact a poison control center or emergency room at once. NOTE: This medicine is only for you. Do not share this medicine with others. What if I miss a dose? It is important not to miss your dose. Call your doctor or health care professional if you are unable to keep an appointment. What may interact with this medicine? -certain antibiotics given by injection -NSAIDs, medicines for pain and inflammation, like ibuprofen or naproxen -some diuretics like bumetanide, furosemide -teriparatide -thalidomide This list may not describe all possible interactions. Give your health care provider a list of all the medicines, herbs, non-prescription drugs, or dietary supplements you use. Also tell them if you smoke, drink alcohol, or use illegal drugs. Some items may interact with your medicine. What should I watch for while using this medicine? Visit  your doctor or health care professional for regular checkups. It may be some time before you see the benefit from this medicine. Do not stop taking your medicine unless your doctor tells you to. Your doctor may order blood tests or other tests to see how you are doing. Women should inform their doctor if they wish to become pregnant or think they might be pregnant. There is a potential for serious side effects to an unborn child. Talk to your health care professional or pharmacist for more information. You should make sure that you get enough calcium and vitamin D while you are taking this medicine. Discuss the foods you eat and the vitamins you take with your health care professional. Some people who take this medicine have severe bone, joint, and/or muscle pain. This medicine may also increase your risk for a broken thigh bone. Tell your doctor right away if you have pain in your upper leg or groin. Tell your doctor if you have any pain that does not go away or that gets worse. What side effects may I notice from receiving this medicine? Side effects that you should report to your doctor or health care professional as soon as possible: -allergic reactions like skin rash, itching or hives, swelling of the face, lips, or tongue -anxiety, confusion, or depression -breathing problems -changes in vision -feeling faint or lightheaded, falls -jaw burning, cramping, pain -muscle cramps, stiffness, or weakness -trouble passing urine or change in the amount of urine Side effects that usually do not require medical attention (report to your doctor or health care professional if they continue or are bothersome): -bone, joint, or muscle pain -  fever -hair loss -irritation at site where injected -loss of appetite -nausea, vomiting -stomach upset -tired This list may not describe all possible side effects. Call your doctor for medical advice about side effects. You may report side effects to FDA at  1-800-FDA-1088. Where should I keep my medicine? This drug is given in a hospital or clinic and will not be stored at home. NOTE: This sheet is a summary. It may not cover all possible information. If you have questions about this medicine, talk to your doctor, pharmacist, or health care provider.  2012, [employer] Standard. (07/05/2010 9:06:58 AM) 

## 2012-05-25 ENCOUNTER — Telehealth: Payer: Self-pay | Admitting: *Deleted

## 2012-05-25 NOTE — Telephone Encounter (Signed)
Progress note from [facility] given to Dr Donnald Garre to review.  SLJ

## 2012-06-10 ENCOUNTER — Other Ambulatory Visit (HOSPITAL_BASED_OUTPATIENT_CLINIC_OR_DEPARTMENT_OTHER): Payer: Medicare Other

## 2012-06-10 DIAGNOSIS — C9000 Multiple myeloma not having achieved remission: Secondary | ICD-10-CM

## 2012-06-10 LAB — CBC WITH DIFFERENTIAL/PLATELET
BASO%: 0.3 % (ref 0.0–2.0)
EOS%: 1.4 % (ref 0.0–7.0)
HCT: 32.7 % — ABNORMAL LOW (ref 38.4–49.9)
MCH: 22.8 pg — ABNORMAL LOW (ref 27.2–33.4)
MCHC: 31.8 g/dL — ABNORMAL LOW (ref 32.0–36.0)
MONO#: 0.4 10*3/uL (ref 0.1–0.9)
NEUT%: 66.4 % (ref 39.0–75.0)
RBC: 4.55 10*6/uL (ref 4.20–5.82)
WBC: 4.2 10*3/uL (ref 4.0–10.3)
lymph#: 1 10*3/uL (ref 0.9–3.3)

## 2012-06-10 LAB — COMPREHENSIVE METABOLIC PANEL (CC13)
ALT: 23 U/L (ref 0–55)
AST: 24 U/L (ref 5–34)
CO2: 22 mEq/L (ref 22–29)
Chloride: 103 mEq/L (ref 98–107)
Creatinine: 1 mg/dL (ref 0.7–1.3)
Sodium: 139 mEq/L (ref 136–145)
Total Bilirubin: 0.7 mg/dL (ref 0.20–1.20)
Total Protein: 7 g/dL (ref 6.4–8.3)

## 2012-06-10 LAB — IGG, IGA, IGM
IgA: 1300 mg/dL — ABNORMAL HIGH (ref 68–379)
IgG (Immunoglobin G), Serum: 355 mg/dL — ABNORMAL LOW (ref 650–1600)

## 2012-06-10 LAB — LACTATE DEHYDROGENASE (CC13): LDH: 193 U/L (ref 125–245)

## 2012-06-11 LAB — KAPPA/LAMBDA LIGHT CHAINS: Kappa free light chain: 0.03 mg/dL — ABNORMAL LOW (ref 0.33–1.94)

## 2012-06-11 LAB — BETA 2 MICROGLOBULIN, SERUM: Beta-2 Microglobulin: 1.98 mg/L — ABNORMAL HIGH (ref 1.01–1.73)

## 2012-06-16 ENCOUNTER — Other Ambulatory Visit: Payer: Medicare Other

## 2012-06-17 ENCOUNTER — Encounter: Payer: Self-pay | Admitting: Internal Medicine

## 2012-06-17 ENCOUNTER — Other Ambulatory Visit: Payer: Medicare Other | Admitting: Lab

## 2012-06-17 ENCOUNTER — Ambulatory Visit (HOSPITAL_BASED_OUTPATIENT_CLINIC_OR_DEPARTMENT_OTHER): Payer: Medicare Other

## 2012-06-17 ENCOUNTER — Ambulatory Visit (HOSPITAL_BASED_OUTPATIENT_CLINIC_OR_DEPARTMENT_OTHER): Payer: Medicare Other | Admitting: Internal Medicine

## 2012-06-17 ENCOUNTER — Telehealth: Payer: Self-pay | Admitting: Internal Medicine

## 2012-06-17 VITALS — BP 151/79 | HR 57 | Temp 96.7°F | Resp 18 | Ht 70.5 in | Wt 169.8 lb

## 2012-06-17 DIAGNOSIS — I10 Essential (primary) hypertension: Secondary | ICD-10-CM

## 2012-06-17 DIAGNOSIS — C9000 Multiple myeloma not having achieved remission: Secondary | ICD-10-CM

## 2012-06-17 DIAGNOSIS — E119 Type 2 diabetes mellitus without complications: Secondary | ICD-10-CM

## 2012-06-17 MED ORDER — DEXAMETHASONE 4 MG PO TABS: ORAL_TABLET | ORAL | Status: DC

## 2012-06-17 MED ORDER — ZOLEDRONIC ACID 4 MG/100ML IV SOLN
4.0000 mg | Freq: Once | INTRAVENOUS | Status: AC
  Administered 2012-06-17: 4 mg via INTRAVENOUS
  Filled 2012-06-17: qty 100

## 2012-06-17 MED ORDER — VALACYCLOVIR HCL 500 MG PO TABS: 500.0000 mg | ORAL_TABLET | Freq: Every day | ORAL | Status: DC

## 2012-06-17 NOTE — Telephone Encounter (Signed)
gv and printed appt sched and avs for pt....MW added tx....pt ok anda ware °

## 2012-06-17 NOTE — Progress Notes (Signed)
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   PRIOR THERAPY: :  1. Status post 6 cycles of systemic chemotherapy with Revlimid and Decadron, last dose was given 07/21/2010 with very good response. 2. 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 Lance Bosch.  3. maintenance Revlimid at 10 mg by mouth daily status post 2 months. Therapy began 01/18/2011. 4. maintenance Revlimid at 15 mg by mouth daily with prophylactic dose Coumadin at 2 mg by mouth daily. 5. Systemic chemotherapy with Velcade at 1.3 mg per meter squared given on days 1, 4, 8 and 11 and Doxil at 30 mg per meter square given on day 4 and Decadron 40 mg by mouth on weekly basis given every 3 weeks. Status post 4 cycles.  CURRENT THERAPY:  1) Velcade 1.3 mg/M. 2 subcutaneous daily on a weekly basis with Decadron 20 mg by mouth on a weekly basis. First cycle expected on 06/21/2012 2) Zometa 4 mg IV every 4 weeks.    INTERVAL HISTORY: Eddie Thomas 67 y.o. male returns to the clinic today for followup visit. The patient is feeling fine today with no specific complaints. He denied having any significant fatigue weakness. He denied having any weight loss or night sweats. He has no chest pain, shortness of breath, cough or hemoptysis. He had repeat myeloma panel performed recently and he is here for evaluation and discussion of his lab results and recommendation regarding treatment of his condition.  MEDICAL HISTORY: Past Medical History  Diagnosis Date  . Multiple myeloma(203.0)   . Diabetes mellitus 07/03/2011  . Hypertension 07/03/2011  . Hyperlipidemia 07/03/2011  . Colon polyps 2012  . Gastric ulcer     ALLERGIES:  has No Known Allergies.  MEDICATIONS:  Current  Outpatient Prescriptions  Medication Sig Dispense Refill  . AMBULATORY NON FORMULARY MEDICATION GI Cocktail Lidocaine and Maalox Take by mouth Every day      . ezetimibe (ZETIA) 10 MG tablet Take 10 mg by mouth daily.        [name] lisinopril (PRINIVIL,ZESTRIL) 10 MG tablet Take 10 mg by mouth daily.        . metFORMIN (GLUCOPHAGE) 500 MG tablet Take 500 mg by mouth daily with breakfast.       . pantoprazole (PROTONIX) 40 MG tablet Take 1 tablet (40 mg total) by mouth 2 (two) times daily.  60 tablet  11  . simvastatin (ZOCOR) 10 MG tablet Take 10 mg by mouth at bedtime.        . sucralfate (CARAFATE) 1 GM/10ML suspension Take 1 g by mouth 4 (four) times daily.      . valACYclovir (VALTREX) 500 MG tablet Take 1 tablet (500 mg total) by mouth daily.  30 tablet  3  . haemophilus B polysaccharide conjugate vaccine (ACTHIB) injection Inject 0.5 mLs into the muscle once.  1 each  0   No current facility-administered medications for this visit.    SURGICAL HISTORY:  Past Surgical History  Procedure Laterality Date  . Limbal stem cell transplant    . Humerus fracture surgery      right    REVIEW OF SYSTEMS:  A comprehensive review of systems was negative.   PHYSICAL EXAMINATION: General appearance: alert, cooperative and no distress Head:  Normocephalic, without obvious abnormality, atraumatic Neck: no adenopathy Lymph nodes: Cervical, supraclavicular, and axillary nodes normal. Resp: clear to auscultation bilaterally Back: symmetric, no curvature. ROM normal. No CVA tenderness. Cardio: regular rate and rhythm, S1, S2 normal, no murmur, click, rub or gallop GI: soft, non-tender; bowel sounds normal; no masses,  no organomegaly Extremities: extremities normal, atraumatic, no cyanosis or edema Neurologic: Alert and oriented X 3, normal strength and tone. Normal symmetric reflexes. Normal coordination and gait  ECOG PERFORMANCE STATUS: 1 - Symptomatic but completely ambulatory  Blood pressure  151/79, pulse 57, temperature 96.7 F (35.9 C), temperature source Oral, resp. rate 18, height 5' 10.5" (1.791 m), weight 169 lb 12.8 oz (77.021 kg).  LABORATORY DATA: Lab Results  Component Value Date   WBC 4.2 06/10/2012   HGB 10.4* 06/10/2012   HCT 32.7* 06/10/2012   MCV 71.9* 06/10/2012   PLT 139* 06/10/2012      Chemistry      Component Value Date/Time   NA 139 06/10/2012 1246   NA 137 02/04/2012 2331   K 3.7 06/10/2012 1246   K 3.8 02/04/2012 2331   CL 103 06/10/2012 1246   CL 91* 02/04/2012 2331   CO2 22 06/10/2012 1246   CO2 34* 02/04/2012 2331   BUN 11.1 06/10/2012 1246   BUN 28* 02/04/2012 2331   CREATININE 1.0 06/10/2012 1246   CREATININE 1.10 02/04/2012 2331      Component Value Date/Time   CALCIUM 9.2 06/10/2012 1246   CALCIUM 9.5 02/04/2012 2331   ALKPHOS 54 06/10/2012 1246   ALKPHOS 71 02/04/2012 2331   AST 24 06/10/2012 1246   AST 18 02/04/2012 2331   ALT 23 06/10/2012 1246   ALT 16 02/04/2012 2331   BILITOT 0.70 06/10/2012 1246   BILITOT 1.3* 02/04/2012 2331       RADIOGRAPHIC STUDIES: No results found.  ASSESSMENT: This is a very pleasant 67 years old [place] American male with history of multiple myeloma status post systemic chemotherapy with Revlimid and Decadron followed by peripheral blood autologous stem cell transplant followed by maintenance Revlimid as well as treatment with Velcade and Doxil as well as Decadron. Unfortunately the patient has some evidence for disease progression especially with the increase in the free lambda light chain.   PLAN: I discussed the lab result with the patient. He was given the option of continuous observation versus resuming systemic chemotherapy. The patient would like to proceed with treatment. I recommended for him the treatment in the form of Velcade 1.3 mg/M. 2 subcutaneous weekly basis in addition to Decadron 20 mg by mouth on a weekly basis. I would not be able to use higher dose of Decadron because of his significant hiccups and  vomiting in the past with the higher dose. If she has any of these complaints, I may discontinue the treatment with Decadron. He is expected to start the first cycle of this treatment on 06/21/2012. He would come back for followup visit in 3 weeks for evaluation and management any adverse effect of his treatment  All questions were answered. The patient knows to call the clinic with any problems, questions or concerns. We can certainly see the patient much sooner if necessary.  I spent 15 minutes counseling the patient face to face. The total time spent in the appointment was 25 minutes.

## 2012-06-17 NOTE — Patient Instructions (Signed)
We discussed treatment options including subcutaneous Velcade and Decadron. Followup visit in 3 weeks

## 2012-06-17 NOTE — Patient Instructions (Addendum)
Zoledronic Acid injection (Hypercalcemia, Oncology) What is this medicine? ZOLEDRONIC ACID (ZOE le dron ik AS id) lowers the amount of calcium loss from bone. It is used to treat too much calcium in your blood from cancer. It is also used to prevent complications of cancer that has spread to the bone. This medicine may be used for other purposes; ask your health care provider or pharmacist if you have questions. What should I tell my health care provider before I take this medicine? They need to know if you have any of these conditions: -aspirin-sensitive asthma -dental disease -kidney disease -an unusual or allergic reaction to zoledronic acid, other medicines, foods, dyes, or preservatives -pregnant or trying to get pregnant -breast-feeding How should I use this medicine? This medicine is for infusion into a vein. It is given by a health care professional in a hospital or clinic setting. Talk to your pediatrician regarding the use of this medicine in children. Special care may be needed. Overdosage: If you think you have taken too much of this medicine contact a poison control center or emergency room at once. NOTE: This medicine is only for you. Do not share this medicine with others. What if I miss a dose? It is important not to miss your dose. Call your doctor or health care professional if you are unable to keep an appointment. What may interact with this medicine? -certain antibiotics given by injection -NSAIDs, medicines for pain and inflammation, like ibuprofen or naproxen -some diuretics like bumetanide, furosemide -teriparatide -thalidomide This list may not describe all possible interactions. Give your health care provider a list of all the medicines, herbs, non-prescription drugs, or dietary supplements you use. Also tell them if you smoke, drink alcohol, or use illegal drugs. Some items may interact with your medicine. What should I watch for while using this medicine? Visit  your doctor or health care professional for regular checkups. It may be some time before you see the benefit from this medicine. Do not stop taking your medicine unless your doctor tells you to. Your doctor may order blood tests or other tests to see how you are doing. Women should inform their doctor if they wish to become pregnant or think they might be pregnant. There is a potential for serious side effects to an unborn child. Talk to your health care professional or pharmacist for more information. You should make sure that you get enough calcium and vitamin D while you are taking this medicine. Discuss the foods you eat and the vitamins you take with your health care professional. Some people who take this medicine have severe bone, joint, and/or muscle pain. This medicine may also increase your risk for a broken thigh bone. Tell your doctor right away if you have pain in your upper leg or groin. Tell your doctor if you have any pain that does not go away or that gets worse. What side effects may I notice from receiving this medicine? Side effects that you should report to your doctor or health care professional as soon as possible: -allergic reactions like skin rash, itching or hives, swelling of the face, lips, or tongue -anxiety, confusion, or depression -breathing problems -changes in vision -feeling faint or lightheaded, falls -jaw burning, cramping, pain -muscle cramps, stiffness, or weakness -trouble passing urine or change in the amount of urine Side effects that usually do not require medical attention (report to your doctor or health care professional if they continue or are bothersome): -bone, joint, or muscle pain -  fever -hair loss -irritation at site where injected -loss of appetite -nausea, vomiting -stomach upset -tired This list may not describe all possible side effects. Call your doctor for medical advice about side effects. You may report side effects to FDA at  1-800-FDA-1088. Where should I keep my medicine? This drug is given in a hospital or clinic and will not be stored at home. NOTE: This sheet is a summary. It may not cover all possible information. If you have questions about this medicine, talk to your doctor, pharmacist, or health care provider.  2012, [employer] Standard. (07/05/2010 9:06:58 AM) 

## 2012-06-18 ENCOUNTER — Encounter: Payer: Self-pay | Admitting: Gastroenterology

## 2012-06-21 ENCOUNTER — Ambulatory Visit (HOSPITAL_BASED_OUTPATIENT_CLINIC_OR_DEPARTMENT_OTHER): Payer: Medicare Other

## 2012-06-21 ENCOUNTER — Other Ambulatory Visit (HOSPITAL_BASED_OUTPATIENT_CLINIC_OR_DEPARTMENT_OTHER): Payer: Medicare Other | Admitting: Lab

## 2012-06-21 VITALS — BP 137/75 | HR 55 | Temp 97.6°F | Resp 20

## 2012-06-21 DIAGNOSIS — Z5112 Encounter for antineoplastic immunotherapy: Secondary | ICD-10-CM

## 2012-06-21 DIAGNOSIS — C9000 Multiple myeloma not having achieved remission: Secondary | ICD-10-CM

## 2012-06-21 LAB — CBC WITH DIFFERENTIAL/PLATELET
BASO%: 0.3 % (ref 0.0–2.0)
EOS%: 0.9 % (ref 0.0–7.0)
HCT: 33.1 % — ABNORMAL LOW (ref 38.4–49.9)
LYMPH%: 20.3 % (ref 14.0–49.0)
MCH: 23 pg — ABNORMAL LOW (ref 27.2–33.4)
MCHC: 32 g/dL (ref 32.0–36.0)
MONO#: 0.3 10*3/uL (ref 0.1–0.9)
MONO%: 6.2 % (ref 0.0–14.0)
NEUT%: 72.3 % (ref 39.0–75.0)
Platelets: 122 10*3/uL — ABNORMAL LOW (ref 140–400)
RBC: 4.59 10*6/uL (ref 4.20–5.82)
WBC: 4.8 10*3/uL (ref 4.0–10.3)

## 2012-06-21 LAB — COMPREHENSIVE METABOLIC PANEL (CC13)
ALT: 30 U/L (ref 0–55)
AST: 30 U/L (ref 5–34)
Albumin: 3.5 g/dL (ref 3.5–5.0)
Alkaline Phosphatase: 49 U/L (ref 40–150)
Potassium: 4 mEq/L (ref 3.5–5.1)
Sodium: 139 mEq/L (ref 136–145)
Total Bilirubin: 0.94 mg/dL (ref 0.20–1.20)
Total Protein: 7.4 g/dL (ref 6.4–8.3)

## 2012-06-21 MED ORDER — BORTEZOMIB CHEMO SQ INJECTION 3.5 MG (2.5MG/ML)
1.3000 mg/m2 | Freq: Once | INTRAMUSCULAR | Status: AC
  Administered 2012-06-21: 2.5 mg via SUBCUTANEOUS
  Filled 2012-06-21: qty 2.5

## 2012-06-21 MED ORDER — ONDANSETRON HCL 8 MG PO TABS
8.0000 mg | ORAL_TABLET | Freq: Once | ORAL | Status: AC
  Administered 2012-06-21: 8 mg via ORAL

## 2012-06-21 NOTE — Patient Instructions (Signed)
Patient aware of next appointment; discharged home with no complaints. 

## 2012-06-22 ENCOUNTER — Ambulatory Visit: Payer: Medicare Other | Admitting: Internal Medicine

## 2012-06-28 ENCOUNTER — Ambulatory Visit (HOSPITAL_BASED_OUTPATIENT_CLINIC_OR_DEPARTMENT_OTHER): Payer: Medicare Other

## 2012-06-28 ENCOUNTER — Other Ambulatory Visit (HOSPITAL_BASED_OUTPATIENT_CLINIC_OR_DEPARTMENT_OTHER): Payer: Medicare Other | Admitting: Lab

## 2012-06-28 VITALS — BP 127/70 | HR 74 | Temp 98.2°F

## 2012-06-28 DIAGNOSIS — C9000 Multiple myeloma not having achieved remission: Secondary | ICD-10-CM

## 2012-06-28 DIAGNOSIS — Z5112 Encounter for antineoplastic immunotherapy: Secondary | ICD-10-CM

## 2012-06-28 LAB — CBC WITH DIFFERENTIAL/PLATELET
Basophils Absolute: 0 10*3/uL (ref 0.0–0.1)
EOS%: 1.1 % (ref 0.0–7.0)
Eosinophils Absolute: 0 10*3/uL (ref 0.0–0.5)
LYMPH%: 19.7 % (ref 14.0–49.0)
MCH: 23.2 pg — ABNORMAL LOW (ref 27.2–33.4)
MCV: 70 fL — ABNORMAL LOW (ref 79.3–98.0)
MONO%: 13.1 % (ref 0.0–14.0)
Platelets: 126 10*3/uL — ABNORMAL LOW (ref 140–400)
RBC: 4.61 10*6/uL (ref 4.20–5.82)
RDW: 14.7 % — ABNORMAL HIGH (ref 11.0–14.6)

## 2012-06-28 LAB — COMPREHENSIVE METABOLIC PANEL (CC13)
ALT: 28 U/L (ref 0–55)
Alkaline Phosphatase: 54 U/L (ref 40–150)
BUN: 7.2 mg/dL (ref 7.0–26.0)
Calcium: 8.9 mg/dL (ref 8.4–10.4)
Glucose: 142 mg/dl — ABNORMAL HIGH (ref 70–99)
Potassium: 3.6 mEq/L (ref 3.5–5.1)
Sodium: 139 mEq/L (ref 136–145)
Total Bilirubin: 0.78 mg/dL (ref 0.20–1.20)
Total Protein: 7.4 g/dL (ref 6.4–8.3)

## 2012-06-28 MED ORDER — BORTEZOMIB CHEMO SQ INJECTION 3.5 MG (2.5MG/ML)
1.3000 mg/m2 | Freq: Once | INTRAMUSCULAR | Status: AC
  Administered 2012-06-28: 2.5 mg via SUBCUTANEOUS
  Filled 2012-06-28: qty 2.5

## 2012-06-28 MED ORDER — ONDANSETRON HCL 8 MG PO TABS
8.0000 mg | ORAL_TABLET | Freq: Once | ORAL | Status: AC
  Administered 2012-06-28: 8 mg via ORAL

## 2012-06-28 NOTE — Patient Instructions (Addendum)
[facility] Discharge Instructions for Patients Receiving Chemotherapy  Today you received the following chemotherapy agents:  Velcade  To help prevent nausea and vomiting after your treatment, we encourage you to take your nausea medication as ordered per MD.   If you develop nausea and vomiting that is not controlled by your nausea medication, call the clinic.   BELOW ARE SYMPTOMS THAT SHOULD BE REPORTED IMMEDIATELY:  *FEVER GREATER THAN 100.5 F  *CHILLS WITH OR WITHOUT FEVER  NAUSEA AND VOMITING THAT IS NOT CONTROLLED WITH YOUR NAUSEA MEDICATION  *UNUSUAL SHORTNESS OF BREATH  *UNUSUAL BRUISING OR BLEEDING  TENDERNESS IN MOUTH AND THROAT WITH OR WITHOUT PRESENCE OF ULCERS  *URINARY PROBLEMS  *BOWEL PROBLEMS  UNUSUAL RASH Items with * indicate a potential emergency and should be followed up as soon as possible.  Feel free to call the clinic you have any questions or concerns. The clinic phone number is [phone].    

## 2012-06-30 ENCOUNTER — Other Ambulatory Visit: Payer: Medicare Other

## 2012-07-05 ENCOUNTER — Encounter: Payer: Self-pay | Admitting: Internal Medicine

## 2012-07-05 ENCOUNTER — Telehealth: Payer: Self-pay | Admitting: Internal Medicine

## 2012-07-05 ENCOUNTER — Ambulatory Visit (HOSPITAL_BASED_OUTPATIENT_CLINIC_OR_DEPARTMENT_OTHER): Payer: Medicare Other

## 2012-07-05 ENCOUNTER — Ambulatory Visit (HOSPITAL_BASED_OUTPATIENT_CLINIC_OR_DEPARTMENT_OTHER): Payer: Medicare Other | Admitting: Internal Medicine

## 2012-07-05 ENCOUNTER — Telehealth: Payer: Self-pay | Admitting: *Deleted

## 2012-07-05 ENCOUNTER — Other Ambulatory Visit (HOSPITAL_BASED_OUTPATIENT_CLINIC_OR_DEPARTMENT_OTHER): Payer: Medicare Other | Admitting: Lab

## 2012-07-05 VITALS — BP 133/77 | HR 98 | Temp 98.8°F | Resp 18 | Ht 70.5 in | Wt 169.2 lb

## 2012-07-05 DIAGNOSIS — Z5112 Encounter for antineoplastic immunotherapy: Secondary | ICD-10-CM

## 2012-07-05 DIAGNOSIS — C9000 Multiple myeloma not having achieved remission: Secondary | ICD-10-CM

## 2012-07-05 LAB — COMPREHENSIVE METABOLIC PANEL (CC13)
AST: 23 U/L (ref 5–34)
Albumin: 3.2 g/dL — ABNORMAL LOW (ref 3.5–5.0)
BUN: 12 mg/dL (ref 7.0–26.0)
Calcium: 9.2 mg/dL (ref 8.4–10.4)
Chloride: 103 mEq/L (ref 98–107)
Glucose: 235 mg/dl — ABNORMAL HIGH (ref 70–99)
Potassium: 4 mEq/L (ref 3.5–5.1)

## 2012-07-05 LAB — CBC WITH DIFFERENTIAL/PLATELET
Basophils Absolute: 0 10*3/uL (ref 0.0–0.1)
Eosinophils Absolute: 0 10*3/uL (ref 0.0–0.5)
HGB: 10.2 g/dL — ABNORMAL LOW (ref 13.0–17.1)
NEUT#: 6.7 10*3/uL — ABNORMAL HIGH (ref 1.5–6.5)
RDW: 14.5 % (ref 11.0–14.6)
WBC: 7.6 10*3/uL (ref 4.0–10.3)
lymph#: 0.7 10*3/uL — ABNORMAL LOW (ref 0.9–3.3)

## 2012-07-05 MED ORDER — ONDANSETRON HCL 8 MG PO TABS
8.0000 mg | ORAL_TABLET | Freq: Once | ORAL | Status: AC
  Administered 2012-07-05: 8 mg via ORAL

## 2012-07-05 MED ORDER — BORTEZOMIB CHEMO SQ INJECTION 3.5 MG (2.5MG/ML)
1.3000 mg/m2 | Freq: Once | INTRAMUSCULAR | Status: AC
  Administered 2012-07-05: 2.5 mg via SUBCUTANEOUS
  Filled 2012-07-05: qty 2.5

## 2012-07-05 NOTE — Telephone Encounter (Signed)
gv and printeda ppt sched and avs for pt....emailed MW to add tx...pt ok and aware   °

## 2012-07-05 NOTE — Progress Notes (Signed)
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   PRIOR THERAPY: :  1. Status post 6 cycles of systemic chemotherapy with Revlimid and Decadron, last dose was given 07/21/2010 with very good response. 2. 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 Lance Bosch.  3. maintenance Revlimid at 10 mg by mouth daily status post 2 months. Therapy began 01/18/2011. 4. maintenance Revlimid at 15 mg by mouth daily with prophylactic dose Coumadin at 2 mg by mouth daily. 5. Systemic chemotherapy with Velcade at 1.3 mg per meter squared given on days 1, 4, 8 and 11 and Doxil at 30 mg per meter square given on day 4 and Decadron 40 mg by mouth on weekly basis given every 3 weeks. Status post 4 cycles.  CURRENT THERAPY:  1) Velcade 1.3 mg/M. 2 subcutaneous daily on a weekly basis with Decadron 20 mg by mouth on a weekly basis. First cycle expected on 06/21/2012  2) Zometa 4 mg IV every 4 weeks.    INTERVAL HISTORY: Eddie Thomas 67 y.o. male returns to the clinic today for followup visit. The patient related the first dose of his chemotherapy with Velcade and Decadron fairly well with no significant adverse effects. He denied having any fever or chills. He has no nausea or vomiting. He denied having any significant peripheral neuropathy. The patient has no chest pain, shortness of breath, cough or hemoptysis.  MEDICAL HISTORY: Past Medical History  Diagnosis Date  . Multiple myeloma   . Diabetes mellitus 07/03/2011  . Hypertension 07/03/2011  . Hyperlipidemia 07/03/2011  . Colon polyps 2012  . Gastric ulcer     ALLERGIES:  has No Known Allergies.  MEDICATIONS:  Current Outpatient Prescriptions  Medication Sig Dispense Refill  . AMBULATORY NON  FORMULARY MEDICATION GI Cocktail Lidocaine and Maalox Take by mouth Every day      . dexamethasone (DECADRON) 4 MG tablet 20 Milligram by mouth every week start with the first dose of chemotherapy  80 tablet  1  . ezetimibe (ZETIA) 10 MG tablet Take 10 mg by mouth daily.        [name] lisinopril (PRINIVIL,ZESTRIL) 10 MG tablet Take 10 mg by mouth daily.        . metFORMIN (GLUCOPHAGE) 500 MG tablet Take 500 mg by mouth daily with breakfast.       . pantoprazole (PROTONIX) 40 MG tablet Take 1 tablet (40 mg total) by mouth 2 (two) times daily.  60 tablet  11  . simvastatin (ZOCOR) 10 MG tablet Take 10 mg by mouth at bedtime.        . sucralfate (CARAFATE) 1 GM/10ML suspension Take 1 g by mouth 4 (four) times daily.      . valACYclovir (VALTREX) 500 MG tablet Take 1 tablet (500 mg total) by mouth daily.  30 tablet  3  . haemophilus B polysaccharide conjugate vaccine (ACTHIB) injection Inject 0.5 mLs into the muscle once.  1 each  0   No current facility-administered medications for this visit.    SURGICAL HISTORY:  Past Surgical History  Procedure Laterality Date  . Limbal stem cell transplant    . Humerus fracture surgery      right    REVIEW OF SYSTEMS:  A comprehensive review of  systems was negative.   PHYSICAL EXAMINATION: General appearance: alert, cooperative and no distress Head: Normocephalic, without obvious abnormality, atraumatic Neck: no adenopathy Lymph nodes: Cervical, supraclavicular, and axillary nodes normal. Resp: clear to auscultation bilaterally Cardio: regular rate and rhythm, S1, S2 normal, no murmur, click, rub or gallop GI: soft, non-tender; bowel sounds normal; no masses,  no organomegaly Extremities: extremities normal, atraumatic, no cyanosis or edema  ECOG PERFORMANCE STATUS: 1 - Symptomatic but completely ambulatory  Blood pressure 133/77, pulse 98, temperature 98.8 F (37.1 C), temperature source Oral, resp. rate 18, height 5' 10.5" (1.791 m), weight 169 lb  3.2 oz (76.749 kg).  LABORATORY DATA: Lab Results  Component Value Date   WBC 7.6 07/05/2012   HGB 10.2* 07/05/2012   HCT 31.0* 07/05/2012   MCV 70.7* 07/05/2012   PLT 128* 07/05/2012      Chemistry      Component Value Date/Time   NA 139 06/28/2012 0909   NA 137 02/04/2012 2331   K 3.6 06/28/2012 0909   K 3.8 02/04/2012 2331   CL 103 06/28/2012 0909   CL 91* 02/04/2012 2331   CO2 26 06/28/2012 0909   CO2 34* 02/04/2012 2331   BUN 7.2 06/28/2012 0909   BUN 28* 02/04/2012 2331   CREATININE 0.9 06/28/2012 0909   CREATININE 1.10 02/04/2012 2331      Component Value Date/Time   CALCIUM 8.9 06/28/2012 0909   CALCIUM 9.5 02/04/2012 2331   ALKPHOS 54 06/28/2012 0909   ALKPHOS 71 02/04/2012 2331   AST 30 06/28/2012 0909   AST 18 02/04/2012 2331   ALT 28 06/28/2012 0909   ALT 16 02/04/2012 2331   BILITOT 0.78 06/28/2012 0909   BILITOT 1.3* 02/04/2012 2331       RADIOGRAPHIC STUDIES: No results found.  ASSESSMENT AND PLAN: This is a very pleasant 67 years old [place] American male with multiple myeloma currently on treatment with Velcade and Decadron status post 2 doses. The patient is tolerating his treatment fairly well with no significant adverse effects.  I recommended for him to continue treatment with the same regimen.  I would see him back for followup visit in 2 weeks for reevaluation. He was advised to call immediately if he has any concerning symptoms in the interval.  All questions were answered. The patient knows to call the clinic with any problems, questions or concerns. We can certainly see the patient much sooner if necessary.

## 2012-07-05 NOTE — Patient Instructions (Addendum)
Continue chemotherapy with Velcade and Decadron as scheduled. Followup visit in 2 weeks.

## 2012-07-05 NOTE — Telephone Encounter (Signed)
misake 

## 2012-07-05 NOTE — Patient Instructions (Addendum)
[facility] Discharge Instructions for Patients Receiving Chemotherapy  Today you received the following chemotherapy agents: Velcade  To help prevent nausea and vomiting after your treatment, we encourage you to take your nausea medication as directed by your MD.   If you develop nausea and vomiting that is not controlled by your nausea medication, call the clinic.   BELOW ARE SYMPTOMS THAT SHOULD BE REPORTED IMMEDIATELY:  *FEVER GREATER THAN 100.5 F  *CHILLS WITH OR WITHOUT FEVER  NAUSEA AND VOMITING THAT IS NOT CONTROLLED WITH YOUR NAUSEA MEDICATION  *UNUSUAL SHORTNESS OF BREATH  *UNUSUAL BRUISING OR BLEEDING  TENDERNESS IN MOUTH AND THROAT WITH OR WITHOUT PRESENCE OF ULCERS  *URINARY PROBLEMS  *BOWEL PROBLEMS  UNUSUAL RASH Items with * indicate a potential emergency and should be followed up as soon as possible.  Feel free to call the clinic you have any questions or concerns. The clinic phone number is [phone].    

## 2012-07-12 ENCOUNTER — Ambulatory Visit (INDEPENDENT_AMBULATORY_CARE_PROVIDER_SITE_OTHER): Payer: Medicare Other | Admitting: Gastroenterology

## 2012-07-12 ENCOUNTER — Other Ambulatory Visit: Payer: Self-pay | Admitting: *Deleted

## 2012-07-12 ENCOUNTER — Ambulatory Visit (HOSPITAL_BASED_OUTPATIENT_CLINIC_OR_DEPARTMENT_OTHER): Payer: Medicare Other

## 2012-07-12 ENCOUNTER — Other Ambulatory Visit (HOSPITAL_BASED_OUTPATIENT_CLINIC_OR_DEPARTMENT_OTHER): Payer: Medicare Other | Admitting: Lab

## 2012-07-12 ENCOUNTER — Encounter: Payer: Self-pay | Admitting: Gastroenterology

## 2012-07-12 VITALS — BP 133/72 | HR 84 | Temp 97.9°F

## 2012-07-12 VITALS — BP 118/60 | HR 100 | Ht 72.0 in | Wt 169.5 lb

## 2012-07-12 DIAGNOSIS — C9000 Multiple myeloma not having achieved remission: Secondary | ICD-10-CM

## 2012-07-12 DIAGNOSIS — Z5112 Encounter for antineoplastic immunotherapy: Secondary | ICD-10-CM

## 2012-07-12 DIAGNOSIS — R1319 Other dysphagia: Secondary | ICD-10-CM

## 2012-07-12 LAB — CBC WITH DIFFERENTIAL/PLATELET
Basophils Absolute: 0 10*3/uL (ref 0.0–0.1)
EOS%: 1 % (ref 0.0–7.0)
HGB: 9.7 g/dL — ABNORMAL LOW (ref 13.0–17.1)
MCH: 22.9 pg — ABNORMAL LOW (ref 27.2–33.4)
NEUT#: 5.3 10*3/uL (ref 1.5–6.5)
RBC: 4.23 10*6/uL (ref 4.20–5.82)
RDW: 14.9 % — ABNORMAL HIGH (ref 11.0–14.6)
lymph#: 0.8 10*3/uL — ABNORMAL LOW (ref 0.9–3.3)

## 2012-07-12 LAB — COMPREHENSIVE METABOLIC PANEL (CC13)
ALT: 13 U/L (ref 0–55)
AST: 26 U/L (ref 5–34)
Albumin: 3 g/dL — ABNORMAL LOW (ref 3.5–5.0)
Alkaline Phosphatase: 53 U/L (ref 40–150)
BUN: 9.7 mg/dL (ref 7.0–26.0)
Potassium: 3.9 mEq/L (ref 3.5–5.1)
Sodium: 140 mEq/L (ref 136–145)
Total Protein: 7.7 g/dL (ref 6.4–8.3)

## 2012-07-12 MED ORDER — ZOLEDRONIC ACID 4 MG/5ML IV CONC: 4.0000 mg | Freq: Once | INTRAVENOUS | Status: DC

## 2012-07-12 MED ORDER — ONDANSETRON HCL 8 MG PO TABS
8.0000 mg | ORAL_TABLET | Freq: Once | ORAL | Status: AC
  Administered 2012-07-12: 8 mg via ORAL

## 2012-07-12 MED ORDER — BORTEZOMIB CHEMO SQ INJECTION 3.5 MG (2.5MG/ML)
1.3000 mg/m2 | Freq: Once | INTRAMUSCULAR | Status: AC
Start: 2012-07-12 — End: 2012-07-12
  Administered 2012-07-12: 2.5 mg via SUBCUTANEOUS
  Filled 2012-07-12: qty 2.5

## 2012-07-12 MED ORDER — ZOLEDRONIC ACID 4 MG/100ML IV SOLN
4.0000 mg | Freq: Once | INTRAVENOUS | Status: AC
  Administered 2012-07-12: 4 mg via INTRAVENOUS
  Filled 2012-07-12: qty 100

## 2012-07-12 NOTE — Patient Instructions (Addendum)
Stay on pantoprazole twice daily.   Call back if your symptoms of dysphagia do not resolve.   Thank you for choosing me and [facility].  Venita Lick. Pleas Koch., MD., Clementeen Graham  cc: [name], MD

## 2012-07-12 NOTE — Progress Notes (Signed)
History of Present Illness: This is a 67 year old male undergoing chemotherapy for multiple myeloma. He had severe esophagitis earlier this year with nausea, vomiting and hiccups. All of his her symptoms have resolved except for occasional solid food dysphagia.   Current Medications, Allergies, Past Medical History, Past Surgical History, Family History and Social History were reviewed in [employer] record.  Physical Exam: General: Well developed , well nourished, no acute distress Head: Normocephalic and atraumatic Eyes:  sclerae anicteric, EOMI Ears: Normal auditory acuity Mouth: No deformity or lesions Lungs: Clear throughout to auscultation Heart: Regular rate and rhythm; no murmurs, rubs or bruits Abdomen: Soft, non tender and non distended. No masses, hepatosplenomegaly or hernias noted. Normal Bowel sounds Musculoskeletal: Symmetrical with no gross deformities  Pulses:  Normal pulses noted Extremities: No clubbing, cyanosis, edema or deformities noted Neurological: Alert oriented x 4, grossly nonfocal Psychological:  Alert and cooperative. Normal mood and affect  Assessment and Recommendations:  1. Mild solid food dysphagia a history of severe esophagitis. Continue pantoprazole 40 mg twice daily. I offered to proceed with upper endoscopy with possible dilation to further evaluate that with his ongoing chemotherapy he would like to hold off on scheduling for now. He will call if his dysphagia does not improve.

## 2012-07-12 NOTE — Patient Instructions (Addendum)
[facility] Center Discharge Instructions for Patients Receiving Chemotherapy  Today you received the following chemotherapy agents Velcade/Zometa  To help prevent nausea and vomiting after your treatment, we encourage you to take your nausea medication as prescribed.    If you develop nausea and vomiting that is not controlled by your nausea medication, call the clinic.   BELOW ARE SYMPTOMS THAT SHOULD BE REPORTED IMMEDIATELY:  *FEVER GREATER THAN 100.5 F  *CHILLS WITH OR WITHOUT FEVER  NAUSEA AND VOMITING THAT IS NOT CONTROLLED WITH YOUR NAUSEA MEDICATION  *UNUSUAL SHORTNESS OF BREATH  *UNUSUAL BRUISING OR BLEEDING  TENDERNESS IN MOUTH AND THROAT WITH OR WITHOUT PRESENCE OF ULCERS  *URINARY PROBLEMS  *BOWEL PROBLEMS  UNUSUAL RASH Items with * indicate a potential emergency and should be followed up as soon as possible.  Feel free to call the clinic you have any questions or concerns. The clinic phone number is [phone].    

## 2012-07-15 ENCOUNTER — Other Ambulatory Visit: Payer: Medicare Other | Admitting: Lab

## 2012-07-15 ENCOUNTER — Ambulatory Visit: Payer: Medicare Other

## 2012-07-16 ENCOUNTER — Telehealth: Payer: Self-pay | Admitting: Medical Oncology

## 2012-07-16 DIAGNOSIS — C9000 Multiple myeloma not having achieved remission: Secondary | ICD-10-CM

## 2012-07-16 MED ORDER — AZITHROMYCIN 250 MG PO TABS: ORAL_TABLET | ORAL | Status: DC

## 2012-07-16 NOTE — Telephone Encounter (Signed)
Called to pt and [facility].

## 2012-07-19 ENCOUNTER — Other Ambulatory Visit (HOSPITAL_BASED_OUTPATIENT_CLINIC_OR_DEPARTMENT_OTHER): Payer: Medicare Other | Admitting: Lab

## 2012-07-19 ENCOUNTER — Ambulatory Visit: Payer: Medicare Other | Admitting: Internal Medicine

## 2012-07-19 ENCOUNTER — Encounter: Payer: Self-pay | Admitting: Internal Medicine

## 2012-07-19 ENCOUNTER — Ambulatory Visit (HOSPITAL_BASED_OUTPATIENT_CLINIC_OR_DEPARTMENT_OTHER): Payer: Medicare Other | Admitting: Internal Medicine

## 2012-07-19 ENCOUNTER — Other Ambulatory Visit: Payer: Medicare Other | Admitting: Lab

## 2012-07-19 ENCOUNTER — Ambulatory Visit (HOSPITAL_BASED_OUTPATIENT_CLINIC_OR_DEPARTMENT_OTHER): Payer: Medicare Other

## 2012-07-19 DIAGNOSIS — R799 Abnormal finding of blood chemistry, unspecified: Secondary | ICD-10-CM

## 2012-07-19 DIAGNOSIS — C9000 Multiple myeloma not having achieved remission: Secondary | ICD-10-CM

## 2012-07-19 DIAGNOSIS — Z5112 Encounter for antineoplastic immunotherapy: Secondary | ICD-10-CM

## 2012-07-19 LAB — CBC WITH DIFFERENTIAL/PLATELET
Basophils Absolute: 0 10*3/uL (ref 0.0–0.1)
EOS%: 0.5 % (ref 0.0–7.0)
HGB: 9.6 g/dL — ABNORMAL LOW (ref 13.0–17.1)
MCH: 23.1 pg — ABNORMAL LOW (ref 27.2–33.4)
MCV: 69.9 fL — ABNORMAL LOW (ref 79.3–98.0)
MONO%: 7.7 % (ref 0.0–14.0)
NEUT%: 77.2 % — ABNORMAL HIGH (ref 39.0–75.0)
RDW: 15 % — ABNORMAL HIGH (ref 11.0–14.6)

## 2012-07-19 LAB — COMPREHENSIVE METABOLIC PANEL (CC13)
ALT: 13 U/L (ref 0–55)
Albumin: 3.1 g/dL — ABNORMAL LOW (ref 3.5–5.0)
CO2: 27 mEq/L (ref 22–29)
Chloride: 103 mEq/L (ref 98–109)
Glucose: 134 mg/dl (ref 70–140)
Potassium: 4.3 mEq/L (ref 3.5–5.1)
Sodium: 140 mEq/L (ref 136–145)
Total Bilirubin: 0.95 mg/dL (ref 0.20–1.20)
Total Protein: 8.6 g/dL — ABNORMAL HIGH (ref 6.4–8.3)

## 2012-07-19 MED ORDER — BORTEZOMIB CHEMO SQ INJECTION 3.5 MG (2.5MG/ML)
1.3000 mg/m2 | Freq: Once | INTRAMUSCULAR | Status: AC
  Administered 2012-07-19: 2.5 mg via SUBCUTANEOUS
  Filled 2012-07-19: qty 2.5

## 2012-07-19 MED ORDER — ONDANSETRON HCL 8 MG PO TABS
8.0000 mg | ORAL_TABLET | Freq: Once | ORAL | Status: AC
  Administered 2012-07-19: 8 mg via ORAL

## 2012-07-19 NOTE — Patient Instructions (Addendum)
Continue weekly Velcade as scheduled.  Followup visit in 2 weeks with repeat myeloma panel.

## 2012-07-19 NOTE — Progress Notes (Signed)
[facility] [phone]   Fax:([phone]  OFFICE PROGRESS NOTE  Eddie Thomas [address] 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   PRIOR THERAPY: :  1. Status post 6 cycles of systemic chemotherapy with Revlimid and Decadron, last dose was given 07/21/2010 with very good response. 2. 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 Lance Bosch.  3. maintenance Revlimid at 10 mg by mouth daily status post 2 months. Therapy began 01/18/2011. 4. maintenance Revlimid at 15 mg by mouth daily with prophylactic dose Coumadin at 2 mg by mouth daily. 5. Systemic chemotherapy with Velcade at 1.3 mg per meter squared given on days 1, 4, 8 and 11 and Doxil at 30 mg per meter square given on day 4 and Decadron 40 mg by mouth on weekly basis given every 3 weeks. Status post 4 cycles. 6. Zometa 4 mg IV every 4 weeks.   CURRENT THERAPY:  1) Velcade 1.3 mg/M2 subcutaneous daily on a weekly basis with Decadron 20 mg by mouth on a weekly basis. First cycle expected on 06/21/2012. S/P 4 cycles.  INTERVAL HISTORY: Eddie Thomas 67 y.o. male returns to the clinic today for followup visit. The patient has no complaints today. He denied having any significant weight loss or night sweats. He denied having any peripheral neuropathy. He has no nausea or vomiting. No fever or chills. He is tolerating his treatment with subcutaneous Velcade and Decadron fairly well with no significant adverse effects.  MEDICAL HISTORY: Past Medical History  Diagnosis Date  . Multiple myeloma   . Diabetes mellitus 07/03/2011  . Hypertension 07/03/2011  . Hyperlipidemia 07/03/2011  . Colon polyps 2012  . Gastric ulcer     ALLERGIES:  has No Known Allergies.  MEDICATIONS:  Current Outpatient Prescriptions  Medication Sig Dispense Refill  . aspirin 81 MG tablet  Take 81 mg by mouth daily.      [name] azithromycin (ZITHROMAX) 250 MG tablet Take as directed per package instructions  6 each  0  . dexamethasone (DECADRON) 4 MG tablet 20 Milligram by mouth every week start with the first dose of chemotherapy  80 tablet  1  . ezetimibe (ZETIA) 10 MG tablet Take 10 mg by mouth daily.        . haemophilus B polysaccharide conjugate vaccine (ACTHIB) injection Inject 0.5 mLs into the muscle once.  1 each  0  . lisinopril (PRINIVIL,ZESTRIL) 10 MG tablet Take 10 mg by mouth daily.        . metFORMIN (GLUCOPHAGE) 500 MG tablet Take 500 mg by mouth daily with breakfast.       . pantoprazole (PROTONIX) 40 MG tablet Take 1 tablet (40 mg total) by mouth 2 (two) times daily.  60 tablet  11  . simvastatin (ZOCOR) 10 MG tablet Take 10 mg by mouth at bedtime.        . valACYclovir (VALTREX) 500 MG tablet Take 1 tablet (500 mg total) by mouth daily.  30 tablet  3   No current facility-administered medications for this visit.    SURGICAL HISTORY:  Past Surgical History  Procedure Laterality Date  . Limbal stem cell transplant    . Humerus fracture surgery      right    REVIEW OF SYSTEMS:  A comprehensive review of systems was negative.   PHYSICAL EXAMINATION: General appearance:  alert, cooperative and no distress Head: Normocephalic, without obvious abnormality, atraumatic Neck: no adenopathy Lymph nodes: Cervical, supraclavicular, and axillary nodes normal. Resp: clear to auscultation bilaterally Cardio: regular rate and rhythm, S1, S2 normal, no murmur, click, rub or gallop GI: soft, non-tender; bowel sounds normal; no masses,  no organomegaly Extremities: extremities normal, atraumatic, no cyanosis or edema  ECOG PERFORMANCE STATUS: 0 - Asymptomatic  There were no vitals taken for this visit.  LABORATORY DATA: Lab Results  Component Value Date   WBC 5.8 07/19/2012   HGB 9.6* 07/19/2012   HCT 29.0* 07/19/2012   MCV 69.9* 07/19/2012   PLT 118* 07/19/2012       Chemistry      Component Value Date/Time   NA 140 07/19/2012 1145   NA 137 02/04/2012 2331   K 4.3 07/19/2012 1145   K 3.8 02/04/2012 2331   CL 103 07/12/2012 0907   CL 91* 02/04/2012 2331   CO2 27 07/19/2012 1145   CO2 34* 02/04/2012 2331   BUN 17.8 07/19/2012 1145   BUN 28* 02/04/2012 2331   CREATININE 1.9* 07/19/2012 1145   CREATININE 1.10 02/04/2012 2331      Component Value Date/Time   CALCIUM 10.0 07/19/2012 1145   CALCIUM 9.5 02/04/2012 2331   ALKPHOS 55 07/19/2012 1145   ALKPHOS 71 02/04/2012 2331   AST 28 07/19/2012 1145   AST 18 02/04/2012 2331   ALT 13 07/19/2012 1145   ALT 16 02/04/2012 2331   BILITOT 0.95 07/19/2012 1145   BILITOT 1.3* 02/04/2012 2331       RADIOGRAPHIC STUDIES: No results found.  ASSESSMENT AND PLAN: This is a very pleasant 67 years old [place] American male with history of multiple myeloma currently on treatment with Velcade and Decadron and tolerating his treatment fairly well.  He has sudden increase in his serum creatinine today of unclear etiology.  I discussed the lab result with the patient. I will continue to monitor his serum creatinine closely with repeat blood work on weekly basis. I also recommend for the patient to have repeat myeloma panel in 2 weeks for evaluation of his disease. He would come back for followup visit at that time. He was advised to call immediately if he has any concerning symptoms in the interval. All questions were answered. The patient knows to call the clinic with any problems, questions or concerns. We can certainly see the patient much sooner if necessary.

## 2012-07-19 NOTE — Patient Instructions (Signed)
[facility] Discharge Instructions for Patients Receiving Chemotherapy  Today you received the following chemotherapy agents: Velcade  To help prevent nausea and vomiting after your treatment, we encourage you to take your nausea medication as directed by your MD.   If you develop nausea and vomiting that is not controlled by your nausea medication, call the clinic.   BELOW ARE SYMPTOMS THAT SHOULD BE REPORTED IMMEDIATELY:  *FEVER GREATER THAN 100.5 F  *CHILLS WITH OR WITHOUT FEVER  NAUSEA AND VOMITING THAT IS NOT CONTROLLED WITH YOUR NAUSEA MEDICATION  *UNUSUAL SHORTNESS OF BREATH  *UNUSUAL BRUISING OR BLEEDING  TENDERNESS IN MOUTH AND THROAT WITH OR WITHOUT PRESENCE OF ULCERS  *URINARY PROBLEMS  *BOWEL PROBLEMS  UNUSUAL RASH Items with * indicate a potential emergency and should be followed up as soon as possible.  Feel free to call the clinic you have any questions or concerns. The clinic phone number is [phone].    

## 2012-07-26 ENCOUNTER — Other Ambulatory Visit (HOSPITAL_BASED_OUTPATIENT_CLINIC_OR_DEPARTMENT_OTHER): Payer: Medicare Other | Admitting: Lab

## 2012-07-26 ENCOUNTER — Ambulatory Visit (HOSPITAL_BASED_OUTPATIENT_CLINIC_OR_DEPARTMENT_OTHER): Payer: Medicare Other

## 2012-07-26 VITALS — BP 127/63 | HR 62 | Temp 98.1°F

## 2012-07-26 DIAGNOSIS — C9000 Multiple myeloma not having achieved remission: Secondary | ICD-10-CM

## 2012-07-26 DIAGNOSIS — Z5112 Encounter for antineoplastic immunotherapy: Secondary | ICD-10-CM

## 2012-07-26 LAB — CBC WITH DIFFERENTIAL/PLATELET
BASO%: 0.4 % (ref 0.0–2.0)
Eosinophils Absolute: 0 10*3/uL (ref 0.0–0.5)
LYMPH%: 13.6 % — ABNORMAL LOW (ref 14.0–49.0)
MCHC: 32.2 g/dL (ref 32.0–36.0)
MONO#: 0.3 10*3/uL (ref 0.1–0.9)
MONO%: 5.2 % (ref 0.0–14.0)
NEUT#: 5.2 10*3/uL (ref 1.5–6.5)
Platelets: 110 10*3/uL — ABNORMAL LOW (ref 140–400)
RBC: 3.96 10*6/uL — ABNORMAL LOW (ref 4.20–5.82)
RDW: 15.1 % — ABNORMAL HIGH (ref 11.0–14.6)
WBC: 6.4 10*3/uL (ref 4.0–10.3)

## 2012-07-26 LAB — COMPREHENSIVE METABOLIC PANEL (CC13)
ALT: 11 U/L (ref 0–55)
AST: 28 U/L (ref 5–34)
Albumin: 3 g/dL — ABNORMAL LOW (ref 3.5–5.0)
CO2: 28 mEq/L (ref 22–29)
Calcium: 10.1 mg/dL (ref 8.4–10.4)
Chloride: 103 mEq/L (ref 98–109)
Potassium: 5.1 mEq/L (ref 3.5–5.1)

## 2012-07-26 MED ORDER — BORTEZOMIB CHEMO SQ INJECTION 3.5 MG (2.5MG/ML)
1.3000 mg/m2 | Freq: Once | INTRAMUSCULAR | Status: AC
  Administered 2012-07-26: 2.5 mg via SUBCUTANEOUS
  Filled 2012-07-26: qty 2.5

## 2012-07-26 MED ORDER — ONDANSETRON HCL 8 MG PO TABS
8.0000 mg | ORAL_TABLET | Freq: Once | ORAL | Status: AC
  Administered 2012-07-26: 8 mg via ORAL

## 2012-07-26 NOTE — Patient Instructions (Signed)
[facility] Discharge Instructions for Patients Receiving Chemotherapy  Today you received the following chemotherapy agents velcade  To help prevent nausea and vomiting after your treatment, we encourage you to take your nausea medication as needed   If you develop nausea and vomiting that is not controlled by your nausea medication, call the clinic.   BELOW ARE SYMPTOMS THAT SHOULD BE REPORTED IMMEDIATELY:  *FEVER GREATER THAN 100.5 F  *CHILLS WITH OR WITHOUT FEVER  NAUSEA AND VOMITING THAT IS NOT CONTROLLED WITH YOUR NAUSEA MEDICATION  *UNUSUAL SHORTNESS OF BREATH  *UNUSUAL BRUISING OR BLEEDING  TENDERNESS IN MOUTH AND THROAT WITH OR WITHOUT PRESENCE OF ULCERS  *URINARY PROBLEMS  *BOWEL PROBLEMS  UNUSUAL RASH Items with * indicate a potential emergency and should be followed up as soon as possible.  Feel free to call the clinic you have any questions or concerns. The clinic phone number is [phone].    

## 2012-08-02 ENCOUNTER — Ambulatory Visit (HOSPITAL_BASED_OUTPATIENT_CLINIC_OR_DEPARTMENT_OTHER): Payer: Medicare Other

## 2012-08-02 ENCOUNTER — Other Ambulatory Visit (HOSPITAL_BASED_OUTPATIENT_CLINIC_OR_DEPARTMENT_OTHER): Payer: Medicare Other

## 2012-08-02 VITALS — BP 142/69 | HR 98 | Temp 98.4°F | Resp 18

## 2012-08-02 DIAGNOSIS — Z5112 Encounter for antineoplastic immunotherapy: Secondary | ICD-10-CM

## 2012-08-02 DIAGNOSIS — C9000 Multiple myeloma not having achieved remission: Secondary | ICD-10-CM

## 2012-08-02 LAB — CBC WITH DIFFERENTIAL/PLATELET
BASO%: 0.5 % (ref 0.0–2.0)
EOS%: 0.5 % (ref 0.0–7.0)
MCH: 23.3 pg — ABNORMAL LOW (ref 27.2–33.4)
MCHC: 32.7 g/dL (ref 32.0–36.0)
NEUT%: 77.5 % — ABNORMAL HIGH (ref 39.0–75.0)
RBC: 3.92 10*6/uL — ABNORMAL LOW (ref 4.20–5.82)
RDW: 15.3 % — ABNORMAL HIGH (ref 11.0–14.6)
lymph#: 1.1 10*3/uL (ref 0.9–3.3)

## 2012-08-02 LAB — COMPREHENSIVE METABOLIC PANEL (CC13)
ALT: 10 U/L (ref 0–55)
AST: 30 U/L (ref 5–34)
Calcium: 10.1 mg/dL (ref 8.4–10.4)
Chloride: 102 mEq/L (ref 98–109)
Creatinine: 1.9 mg/dL — ABNORMAL HIGH (ref 0.7–1.3)
Potassium: 3.9 mEq/L (ref 3.5–5.1)
Sodium: 140 mEq/L (ref 136–145)

## 2012-08-02 MED ORDER — ONDANSETRON HCL 8 MG PO TABS
8.0000 mg | ORAL_TABLET | Freq: Once | ORAL | Status: AC
  Administered 2012-08-02: 8 mg via ORAL

## 2012-08-02 MED ORDER — BORTEZOMIB CHEMO SQ INJECTION 3.5 MG (2.5MG/ML)
1.3000 mg/m2 | Freq: Once | INTRAMUSCULAR | Status: AC
  Administered 2012-08-02: 2.5 mg via SUBCUTANEOUS
  Filled 2012-08-02: qty 2.5

## 2012-08-02 NOTE — Patient Instructions (Addendum)
[facility] Discharge Instructions for Patients Receiving Chemotherapy  Today you received the following chemotherapy agents velcade  To help prevent nausea and vomiting after your treatment, we encourage you to take your nausea medication as needed   If you develop nausea and vomiting that is not controlled by your nausea medication, call the clinic.   BELOW ARE SYMPTOMS THAT SHOULD BE REPORTED IMMEDIATELY:  *FEVER GREATER THAN 100.5 F  *CHILLS WITH OR WITHOUT FEVER  NAUSEA AND VOMITING THAT IS NOT CONTROLLED WITH YOUR NAUSEA MEDICATION  *UNUSUAL SHORTNESS OF BREATH  *UNUSUAL BRUISING OR BLEEDING  TENDERNESS IN MOUTH AND THROAT WITH OR WITHOUT PRESENCE OF ULCERS  *URINARY PROBLEMS  *BOWEL PROBLEMS  UNUSUAL RASH Items with * indicate a potential emergency and should be followed up as soon as possible.  Feel free to call the clinic you have any questions or concerns. The clinic phone number is [phone].    

## 2012-08-03 LAB — IGG, IGA, IGM
IgA: 3910 mg/dL — ABNORMAL HIGH (ref 68–379)
IgM, Serum: 4 mg/dL — ABNORMAL LOW (ref 41–251)

## 2012-08-03 LAB — KAPPA/LAMBDA LIGHT CHAINS: Lambda Free Lght Chn: 47.9 mg/dL — ABNORMAL HIGH (ref 0.57–2.63)

## 2012-08-05 ENCOUNTER — Encounter: Payer: Self-pay | Admitting: Internal Medicine

## 2012-08-05 ENCOUNTER — Telehealth: Payer: Self-pay | Admitting: *Deleted

## 2012-08-05 ENCOUNTER — Telehealth: Payer: Self-pay | Admitting: Internal Medicine

## 2012-08-05 ENCOUNTER — Ambulatory Visit (HOSPITAL_BASED_OUTPATIENT_CLINIC_OR_DEPARTMENT_OTHER): Payer: Medicare Other | Admitting: Internal Medicine

## 2012-08-05 VITALS — BP 140/72 | HR 92 | Temp 99.1°F | Resp 18 | Ht 72.0 in | Wt 167.7 lb

## 2012-08-05 DIAGNOSIS — C9000 Multiple myeloma not having achieved remission: Secondary | ICD-10-CM

## 2012-08-05 DIAGNOSIS — N289 Disorder of kidney and ureter, unspecified: Secondary | ICD-10-CM

## 2012-08-05 NOTE — Telephone Encounter (Signed)
Per staff phone call and POF I have schedueld appts.  JMW  

## 2012-08-05 NOTE — Telephone Encounter (Signed)
Gave pt appt for lab , MD and chemo for July and August 2015

## 2012-08-06 ENCOUNTER — Other Ambulatory Visit: Payer: Self-pay | Admitting: Pharmacist

## 2012-08-06 DIAGNOSIS — C9000 Multiple myeloma not having achieved remission: Secondary | ICD-10-CM

## 2012-08-07 NOTE — Patient Instructions (Signed)
Myeloma panel showed evidence for disease progression. We discussed treatment options and I recommended for him treatment with Carfilzomib, Cytoxan and Decadron. Followup visit in 2 weeks.

## 2012-08-07 NOTE — Progress Notes (Signed)
[facility] [phone]   [phone]  OFFICE PROGRESS NOTE  [name] [address] 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   PRIOR THERAPY: :  1. Status post 6 cycles of systemic chemotherapy with Revlimid and Decadron, last dose was given 07/21/2010 with very good response. 2. 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 Lance Bosch.  3. maintenance Revlimid at 10 mg by mouth daily status post 2 months. Therapy began 01/18/2011. 4. maintenance Revlimid at 15 mg by mouth daily with prophylactic dose Coumadin at 2 mg by mouth daily. 5. Systemic chemotherapy with Velcade at 1.3 mg per meter squared given on days 1, 4, 8 and 11 and Doxil at 30 mg per meter square given on day 4 and Decadron 40 mg by mouth on weekly basis given every 3 weeks. Status post 4 cycles. 6. Zometa 4 mg IV every 4 weeks.  7. Velcade 1.3 mg/M2 subcutaneous daily on a weekly basis with Decadron 20 mg by mouth on a weekly basis. First cycle expected on 06/21/2012. S/P 4 cycles.  CURRENT THERAPY:  1) systemic chemotherapy with Carfilzomib, cyclophosphamide and Decadron. First cycle expected on 08/09/2012.    INTERVAL HISTORY: Eddie Thomas 67 y.o. male returns to the clinic today for followup visit. The patient is feeling fine today with no specific complaints. He denied having any significant chest pain, shortness of breath, cough or hemoptysis. He has no nausea or vomiting, no fever or chills. His main concern today is about the worsening of his renal function. He denied having any significant weight loss or night sweats. The patient had repeat myeloma panel performed recently and he is here for evaluation and discussion of his lab results and recommendation regarding treatment of his condition.  MEDICAL HISTORY: Past Medical History    Diagnosis Date  . Multiple myeloma   . Diabetes mellitus 07/03/2011  . Hypertension 07/03/2011  . Hyperlipidemia 07/03/2011  . Colon polyps 2012  . Gastric ulcer     ALLERGIES:  has No Known Allergies.  MEDICATIONS:  Current Outpatient Prescriptions  Medication Sig Dispense Refill  . aspirin 81 MG tablet Take 81 mg by mouth daily.      [name] dexamethasone (DECADRON) 4 MG tablet 20 Milligram by mouth every week start with the first dose of chemotherapy  80 tablet  1  . ezetimibe (ZETIA) 10 MG tablet Take 10 mg by mouth daily.        . haemophilus B polysaccharide conjugate vaccine (ACTHIB) injection Inject 0.5 mLs into the muscle once.  1 each  0  . lisinopril (PRINIVIL,ZESTRIL) 10 MG tablet Take 10 mg by mouth daily.        . metFORMIN (GLUCOPHAGE) 500 MG tablet Take 500 mg by mouth daily with breakfast.       . pantoprazole (PROTONIX) 40 MG tablet Take 1 tablet (40 mg total) by mouth 2 (two) times daily.  60 tablet  11  . simvastatin (ZOCOR) 10 MG tablet Take 10 mg by mouth at bedtime.        . valACYclovir (VALTREX) 500 MG tablet Take 1 tablet (500 mg total) by mouth daily.  30 tablet  3   No current facility-administered medications for this visit.    SURGICAL HISTORY:  Past Surgical History  Procedure Laterality Date  . Limbal stem cell transplant    .  Humerus fracture surgery      right    REVIEW OF SYSTEMS:  A comprehensive review of systems was negative.   PHYSICAL EXAMINATION: General appearance: alert, cooperative and no distress Head: Normocephalic, without obvious abnormality, atraumatic Neck: no adenopathy Lymph nodes: Cervical, supraclavicular, and axillary nodes normal. Resp: clear to auscultation bilaterally Back: symmetric, no curvature. ROM normal. No CVA tenderness. Cardio: regular rate and rhythm, S1, S2 normal, no murmur, click, rub or gallop GI: soft, non-tender; bowel sounds normal; no masses,  no organomegaly Extremities: extremities normal, atraumatic,  no cyanosis or edema Neurologic: Alert and oriented X 3, normal strength and tone. Normal symmetric reflexes. Normal coordination and gait  ECOG PERFORMANCE STATUS: 1 - Symptomatic but completely ambulatory  Blood pressure 140/72, pulse 92, temperature 99.1 F (37.3 C), temperature source Oral, resp. rate 18, height 6' (1.829 m), weight 167 lb 11.2 oz (76.068 kg).  LABORATORY DATA: Lab Results  Component Value Date   WBC 7.0 08/02/2012   HGB 9.1* 08/02/2012   HCT 27.9* 08/02/2012   MCV 71.2* 08/02/2012   PLT 110* 08/02/2012      Chemistry      Component Value Date/Time   NA 140 08/02/2012 1040   NA 137 02/04/2012 2331   K 3.9 08/02/2012 1040   K 3.8 02/04/2012 2331   CL 103 07/12/2012 0907   CL 91* 02/04/2012 2331   CO2 23 08/02/2012 1040   CO2 34* 02/04/2012 2331   BUN 19.8 08/02/2012 1040   BUN 28* 02/04/2012 2331   CREATININE 1.9* 08/02/2012 1040   CREATININE 1.10 02/04/2012 2331      Component Value Date/Time   CALCIUM 10.1 08/02/2012 1040   CALCIUM 9.5 02/04/2012 2331   ALKPHOS 58 08/02/2012 1040   ALKPHOS 71 02/04/2012 2331   AST 30 08/02/2012 1040   AST 18 02/04/2012 2331   ALT 10 08/02/2012 1040   ALT 16 02/04/2012 2331   BILITOT 0.76 08/02/2012 1040   BILITOT 1.3* 02/04/2012 2331       RADIOGRAPHIC STUDIES: No results found.  ASSESSMENT AND PLAN: This is a very pleasant 67 years old African American male with history of multiple myeloma, IgA subtype most recently treated with Velcade and Decadron but unfortunately continues to have evidence for disease progression on his recent myeloma panel. I have a lengthy discussion with the patient today about his current disease status and treatment options. I recommended for the patient to discontinue his treatment with Velcade. I would consider him for treatment with Carfilzomib, Cytoxan and Decadron starting to next week. I discussed with the patient adverse effects of this treatment and he was getting handout about Carfilzomib today. He  was advised to the potential adverse effect of the treatment including cardiac and liver dysfunction in addition to myelosuppression and pulmonary complications including pulmonary hypertension. The patient would like to proceed with treatment as planned He would come back for followup visit in 2 weeks for evaluation and management any adverse effect of his chemotherapy. He was advised to call immediately if he has any concerning symptoms in the interval. His renal dysfunction could be secondary to his disease progression and I will continue to monitor it closely.  All questions were answered. The patient knows to call the clinic with any problems, questions or concerns. We can certainly see the patient much sooner if necessary.  I spent 15 minutes counseling the patient face to face. The total time spent in the appointment was 25 minutes.

## 2012-08-09 ENCOUNTER — Ambulatory Visit (HOSPITAL_BASED_OUTPATIENT_CLINIC_OR_DEPARTMENT_OTHER): Payer: Medicare Other

## 2012-08-09 ENCOUNTER — Ambulatory Visit: Payer: Medicare Other

## 2012-08-09 ENCOUNTER — Other Ambulatory Visit: Payer: Self-pay | Admitting: Internal Medicine

## 2012-08-09 ENCOUNTER — Other Ambulatory Visit (HOSPITAL_BASED_OUTPATIENT_CLINIC_OR_DEPARTMENT_OTHER): Payer: Medicare Other | Admitting: Lab

## 2012-08-09 VITALS — BP 145/76 | HR 85 | Temp 97.9°F

## 2012-08-09 DIAGNOSIS — C9000 Multiple myeloma not having achieved remission: Secondary | ICD-10-CM

## 2012-08-09 DIAGNOSIS — Z5112 Encounter for antineoplastic immunotherapy: Secondary | ICD-10-CM

## 2012-08-09 LAB — COMPREHENSIVE METABOLIC PANEL (CC13)
ALT: 11 U/L (ref 0–55)
CO2: 24 mEq/L (ref 22–29)
Calcium: 9.7 mg/dL (ref 8.4–10.4)
Chloride: 105 mEq/L (ref 98–109)
Glucose: 135 mg/dl (ref 70–140)
Sodium: 142 mEq/L (ref 136–145)
Total Bilirubin: 0.86 mg/dL (ref 0.20–1.20)
Total Protein: 9.5 g/dL — ABNORMAL HIGH (ref 6.4–8.3)

## 2012-08-09 LAB — CBC WITH DIFFERENTIAL/PLATELET
BASO%: 0.4 % (ref 0.0–2.0)
Eosinophils Absolute: 0 10*3/uL (ref 0.0–0.5)
HCT: 25.1 % — ABNORMAL LOW (ref 38.4–49.9)
LYMPH%: 19.1 % (ref 14.0–49.0)
MCHC: 31.9 g/dL — ABNORMAL LOW (ref 32.0–36.0)
MONO#: 0.2 10*3/uL (ref 0.1–0.9)
NEUT#: 3.8 10*3/uL (ref 1.5–6.5)
NEUT%: 75.3 % — ABNORMAL HIGH (ref 39.0–75.0)
Platelets: 88 10*3/uL — ABNORMAL LOW (ref 140–400)
WBC: 5 10*3/uL (ref 4.0–10.3)
lymph#: 1 10*3/uL (ref 0.9–3.3)

## 2012-08-09 MED ORDER — DEXAMETHASONE SODIUM PHOSPHATE 10 MG/ML IJ SOLN
10.0000 mg | Freq: Once | INTRAMUSCULAR | Status: AC
  Administered 2012-08-09: 10 mg via INTRAVENOUS

## 2012-08-09 MED ORDER — DEXTROSE 5 % IV SOLN
20.0000 mg/m2 | Freq: Once | INTRAVENOUS | Status: AC
  Administered 2012-08-09: 40 mg via INTRAVENOUS
  Filled 2012-08-09: qty 20

## 2012-08-09 MED ORDER — SODIUM CHLORIDE 0.9 % IV SOLN
Freq: Once | INTRAVENOUS | Status: AC
  Administered 2012-08-09: 13:00:00 via INTRAVENOUS

## 2012-08-09 MED ORDER — ONDANSETRON 8 MG/50ML IVPB (CHCC)
8.0000 mg | Freq: Once | INTRAVENOUS | Status: AC
  Administered 2012-08-09: 8 mg via INTRAVENOUS

## 2012-08-09 MED ORDER — SODIUM CHLORIDE 0.9 % IV SOLN
Freq: Once | INTRAVENOUS | Status: AC
  Administered 2012-08-09: 12:00:00 via INTRAVENOUS

## 2012-08-09 MED ORDER — DEXTROSE 5 % IV SOLN: 27.0000 mg/m2 | Freq: Once | INTRAVENOUS | Status: DC

## 2012-08-09 MED ORDER — ZOLEDRONIC ACID 4 MG/5ML IV CONC: 4.0000 mg | Freq: Once | INTRAVENOUS | Status: DC

## 2012-08-09 MED ORDER — SODIUM CHLORIDE 0.9 % IV SOLN
300.0000 mg/m2 | Freq: Once | INTRAVENOUS | Status: AC
  Administered 2012-08-09: 600 mg via INTRAVENOUS
  Filled 2012-08-09: qty 30

## 2012-08-09 NOTE — Progress Notes (Signed)
Per Dr Donnald Garre, okay to tx with plts 88  Per pharmacy, zometa will be held today due to sr cr.  Pt is aware.  SLJ

## 2012-08-09 NOTE — Patient Instructions (Signed)
[facility] Discharge Instructions for Patients Receiving Chemotherapy  Today you received the following chemotherapy agents kyprolis, cytoxan  To help prevent nausea and vomiting after your treatment, we encourage you to take your nausea medication as needed   If you develop nausea and vomiting that is not controlled by your nausea medication, call the clinic.   BELOW ARE SYMPTOMS THAT SHOULD BE REPORTED IMMEDIATELY:  *FEVER GREATER THAN 100.5 F  *CHILLS WITH OR WITHOUT FEVER  NAUSEA AND VOMITING THAT IS NOT CONTROLLED WITH YOUR NAUSEA MEDICATION  *UNUSUAL SHORTNESS OF BREATH  *UNUSUAL BRUISING OR BLEEDING  TENDERNESS IN MOUTH AND THROAT WITH OR WITHOUT PRESENCE OF ULCERS  *URINARY PROBLEMS  *BOWEL PROBLEMS  UNUSUAL RASH Items with * indicate a potential emergency and should be followed up as soon as possible.  Feel free to call the clinic you have any questions or concerns. The clinic phone number is [phone].    

## 2012-08-10 ENCOUNTER — Ambulatory Visit (HOSPITAL_BASED_OUTPATIENT_CLINIC_OR_DEPARTMENT_OTHER): Payer: Medicare Other

## 2012-08-10 VITALS — BP 119/63 | HR 97 | Temp 97.0°F

## 2012-08-10 DIAGNOSIS — Z5112 Encounter for antineoplastic immunotherapy: Secondary | ICD-10-CM

## 2012-08-10 DIAGNOSIS — C9000 Multiple myeloma not having achieved remission: Secondary | ICD-10-CM

## 2012-08-10 MED ORDER — DEXTROSE 5 % IV SOLN
20.0000 mg/m2 | Freq: Once | INTRAVENOUS | Status: AC
  Administered 2012-08-10: 40 mg via INTRAVENOUS
  Filled 2012-08-10: qty 20

## 2012-08-10 MED ORDER — DEXAMETHASONE SODIUM PHOSPHATE 10 MG/ML IJ SOLN
10.0000 mg | Freq: Once | INTRAMUSCULAR | Status: AC
  Administered 2012-08-10: 10 mg via INTRAVENOUS

## 2012-08-10 MED ORDER — SODIUM CHLORIDE 0.9 % IV SOLN
Freq: Once | INTRAVENOUS | Status: AC
  Administered 2012-08-10: 15:00:00 via INTRAVENOUS

## 2012-08-10 MED ORDER — ONDANSETRON 8 MG/50ML IVPB (CHCC)
8.0000 mg | Freq: Once | INTRAVENOUS | Status: AC
  Administered 2012-08-10: 8 mg via INTRAVENOUS

## 2012-08-10 NOTE — Patient Instructions (Addendum)
[facility] Discharge Instructions for Patients Receiving Chemotherapy  Today you received the following chemotherapy agents Kyprolis.  To help prevent nausea and vomiting after your treatment, we encourage you to take your nausea medication.   If you develop nausea and vomiting that is not controlled by your nausea medication, call the clinic.   BELOW ARE SYMPTOMS THAT SHOULD BE REPORTED IMMEDIATELY:  *FEVER GREATER THAN 100.5 F  *CHILLS WITH OR WITHOUT FEVER  NAUSEA AND VOMITING THAT IS NOT CONTROLLED WITH YOUR NAUSEA MEDICATION  *UNUSUAL SHORTNESS OF BREATH  *UNUSUAL BRUISING OR BLEEDING  TENDERNESS IN MOUTH AND THROAT WITH OR WITHOUT PRESENCE OF ULCERS  *URINARY PROBLEMS  *BOWEL PROBLEMS  UNUSUAL RASH Items with * indicate a potential emergency and should be followed up as soon as possible.  Feel free to call the clinic you have any questions or concerns. The clinic phone number is [phone].    

## 2012-08-11 ENCOUNTER — Telehealth: Payer: Self-pay | Admitting: *Deleted

## 2012-08-11 NOTE — Telephone Encounter (Signed)
*  See Doc flowsheet Confirmed with patient that he does have compazine on hand from prior treatment for nausea and it is still in date.

## 2012-08-12 ENCOUNTER — Other Ambulatory Visit: Payer: Medicare Other | Admitting: Lab

## 2012-08-12 ENCOUNTER — Telehealth: Payer: Self-pay | Admitting: Medical Oncology

## 2012-08-12 DIAGNOSIS — C9000 Multiple myeloma not having achieved remission: Secondary | ICD-10-CM

## 2012-08-12 MED ORDER — AZITHROMYCIN 250 MG PO TABS: ORAL_TABLET | ORAL | Status: DC

## 2012-08-12 NOTE — Telephone Encounter (Signed)
PT REPORTS FEVER OF 101.1 F, NONPRODUCTIVE COUGH AND INCREASED SOB. PER DR MOHAMED AI CALLED IN [facility] AND PT NOTIFIED.

## 2012-08-16 ENCOUNTER — Ambulatory Visit (HOSPITAL_BASED_OUTPATIENT_CLINIC_OR_DEPARTMENT_OTHER): Payer: Medicare Other

## 2012-08-16 ENCOUNTER — Encounter (HOSPITAL_COMMUNITY)
Admission: RE | Admit: 2012-08-16 | Discharge: 2012-08-16 | Disposition: A | Discharge status: Home / Self Care | Payer: Medicare Other | Source: Ambulatory Visit | Attending: Internal Medicine | Admitting: Internal Medicine

## 2012-08-16 ENCOUNTER — Ambulatory Visit: Payer: Medicare Other

## 2012-08-16 ENCOUNTER — Other Ambulatory Visit: Payer: Self-pay | Admitting: Internal Medicine

## 2012-08-16 ENCOUNTER — Other Ambulatory Visit (HOSPITAL_BASED_OUTPATIENT_CLINIC_OR_DEPARTMENT_OTHER): Payer: Medicare Other | Admitting: Lab

## 2012-08-16 ENCOUNTER — Other Ambulatory Visit: Payer: Self-pay | Admitting: Medical Oncology

## 2012-08-16 ENCOUNTER — Encounter: Payer: Self-pay | Admitting: Specialist

## 2012-08-16 VITALS — BP 155/81 | HR 71 | Temp 97.9°F | Resp 20

## 2012-08-16 DIAGNOSIS — Z5111 Encounter for antineoplastic chemotherapy: Secondary | ICD-10-CM

## 2012-08-16 DIAGNOSIS — D649 Anemia, unspecified: Secondary | ICD-10-CM

## 2012-08-16 DIAGNOSIS — Z5112 Encounter for antineoplastic immunotherapy: Secondary | ICD-10-CM

## 2012-08-16 DIAGNOSIS — C9000 Multiple myeloma not having achieved remission: Secondary | ICD-10-CM

## 2012-08-16 LAB — COMPREHENSIVE METABOLIC PANEL (CC13)
Albumin: 2.6 g/dL — ABNORMAL LOW (ref 3.5–5.0)
BUN: 15.2 mg/dL (ref 7.0–26.0)
Calcium: 8.9 mg/dL (ref 8.4–10.4)
Chloride: 109 mEq/L (ref 98–109)
Glucose: 137 mg/dl (ref 70–140)
Potassium: 3.6 mEq/L (ref 3.5–5.1)

## 2012-08-16 LAB — CBC WITH DIFFERENTIAL/PLATELET
Basophils Absolute: 0 10*3/uL (ref 0.0–0.1)
EOS%: 0.3 % (ref 0.0–7.0)
HCT: 23.1 % — ABNORMAL LOW (ref 38.4–49.9)
HGB: 7.3 g/dL — ABNORMAL LOW (ref 13.0–17.1)
MCH: 22.6 pg — ABNORMAL LOW (ref 27.2–33.4)
MONO#: 0.2 10*3/uL (ref 0.1–0.9)
NEUT%: 76.2 % — ABNORMAL HIGH (ref 39.0–75.0)
lymph#: 0.6 10*3/uL — ABNORMAL LOW (ref 0.9–3.3)

## 2012-08-16 MED ORDER — SODIUM CHLORIDE 0.9 % IV SOLN: Freq: Once | INTRAVENOUS | Status: DC

## 2012-08-16 MED ORDER — ONDANSETRON 8 MG/50ML IVPB (CHCC)
8.0000 mg | Freq: Once | INTRAVENOUS | Status: AC
  Administered 2012-08-16: 8 mg via INTRAVENOUS

## 2012-08-16 MED ORDER — DEXAMETHASONE SODIUM PHOSPHATE 10 MG/ML IJ SOLN
10.0000 mg | Freq: Once | INTRAMUSCULAR | Status: AC
  Administered 2012-08-16: 10 mg via INTRAVENOUS

## 2012-08-16 MED ORDER — ACETAMINOPHEN 325 MG PO TABS
650.0000 mg | ORAL_TABLET | Freq: Once | ORAL | Status: AC
  Administered 2012-08-16: 650 mg via ORAL

## 2012-08-16 MED ORDER — CYCLOPHOSPHAMIDE CHEMO INJECTION 1 GM
250.0000 mg/m2 | Freq: Once | INTRAMUSCULAR | Status: AC
  Administered 2012-08-16: 500 mg via INTRAVENOUS
  Filled 2012-08-16: qty 25

## 2012-08-16 MED ORDER — DEXTROSE 5 % IV SOLN
20.0000 mg/m2 | Freq: Once | INTRAVENOUS | Status: AC
  Administered 2012-08-16: 40 mg via INTRAVENOUS
  Filled 2012-08-16: qty 20

## 2012-08-16 MED ORDER — SODIUM CHLORIDE 0.9 % IV SOLN
250.0000 mL | Freq: Once | INTRAVENOUS | Status: AC
  Administered 2012-08-16: 250 mL via INTRAVENOUS

## 2012-08-16 MED ORDER — DIPHENHYDRAMINE HCL 50 MG/ML IJ SOLN
25.0000 mg | Freq: Once | INTRAMUSCULAR | Status: AC
  Administered 2012-08-16: 25 mg via INTRAVENOUS

## 2012-08-16 NOTE — Patient Instructions (Addendum)
[facility] Discharge Instructions for Patients Receiving Chemotherapy  Today you received the following chemotherapy agents Cytoxan/Kyprolis.  To help prevent nausea and vomiting after your treatment, we encourage you to take your nausea medication as prescribed.   If you develop nausea and vomiting that is not controlled by your nausea medication, call the clinic.   BELOW ARE SYMPTOMS THAT SHOULD BE REPORTED IMMEDIATELY:  *FEVER GREATER THAN 100.5 F  *CHILLS WITH OR WITHOUT FEVER  NAUSEA AND VOMITING THAT IS NOT CONTROLLED WITH YOUR NAUSEA MEDICATION  *UNUSUAL SHORTNESS OF BREATH  *UNUSUAL BRUISING OR BLEEDING  TENDERNESS IN MOUTH AND THROAT WITH OR WITHOUT PRESENCE OF ULCERS  *URINARY PROBLEMS  *BOWEL PROBLEMS  UNUSUAL RASH Items with * indicate a potential emergency and should be followed up as soon as possible.  Feel free to call the clinic you have any questions or concerns. The clinic phone number is [phone].   Blood Transfusion Information WHAT IS A BLOOD TRANSFUSION? A transfusion is the replacement of blood or some of its parts. Blood is made up of multiple cells which provide different functions.  Red blood cells carry oxygen and are used for blood loss replacement.  White blood cells fight against infection.  Platelets control bleeding.  Plasma helps clot blood.  Other blood products are available for specialized needs, such as hemophilia or other clotting disorders. BEFORE THE TRANSFUSION  Who gives blood for transfusions?   You may be able to donate blood to be used at a later date on yourself (autologous donation).  Relatives can be asked to donate blood. This is generally not any safer than if you have received blood from a stranger. The same precautions are taken to ensure safety when a relative's blood is donated.  Healthy volunteers who are fully evaluated to make sure their blood is safe. This is blood bank  blood. Transfusion therapy is the safest it has ever been in the practice of medicine. Before blood is taken from a donor, a complete history is taken to make sure that person has no history of diseases nor engages in risky social behavior (examples are intravenous drug use or sexual activity with multiple partners). The donor's travel history is screened to minimize risk of transmitting infections, such as malaria. The donated blood is tested for signs of infectious diseases, such as HIV and hepatitis. The blood is then tested to be sure it is compatible with you in order to minimize the chance of a transfusion reaction. If you or a relative donates blood, this is often done in anticipation of surgery and is not appropriate for emergency situations. It takes many days to process the donated blood. RISKS AND COMPLICATIONS Although transfusion therapy is very safe and saves many lives, the main dangers of transfusion include:   Getting an infectious disease.  Developing a transfusion reaction. This is an allergic reaction to something in the blood you were given. Every precaution is taken to prevent this. The decision to have a blood transfusion has been considered carefully by your caregiver before blood is given. Blood is not given unless the benefits outweigh the risks. AFTER THE TRANSFUSION  Right after receiving a blood transfusion, you will usually feel much better and more energetic. This is especially true if your red blood cells have gotten low (anemic). The transfusion raises the level of the red blood cells which carry oxygen, and this usually causes an energy increase.  The nurse administering the transfusion will monitor you carefully  for complications. HOME CARE INSTRUCTIONS  No special instructions are needed after a transfusion. You may find your energy is better. Speak with your caregiver about any limitations on activity for underlying diseases you may have. SEEK MEDICAL CARE IF:    Your condition is not improving after your transfusion.  You develop redness or irritation at the intravenous (IV) site. SEEK IMMEDIATE MEDICAL CARE IF:  Any of the following symptoms occur over the next 12 hours:  Shaking chills.  You have a temperature by mouth above 102 F (38.9 C), not controlled by medicine.  Chest, back, or muscle pain.  People around you feel you are not acting correctly or are confused.  Shortness of breath or difficulty breathing.  Dizziness and fainting.  You get a rash or develop hives.  You have a decrease in urine output.  Your urine turns a dark color or changes to pink, red, or brown. Any of the following symptoms occur over the next 10 days:  You have a temperature by mouth above 102 F (38.9 C), not controlled by medicine.  Shortness of breath.  Weakness after normal activity.  The white part of the eye turns yellow (jaundice).  You have a decrease in the amount of urine or are urinating less often.  Your urine turns a dark color or changes to pink, red, or brown. Document Released: 01/04/2000 Document Revised: 03/31/2011 Document Reviewed: 08/23/2007 [facility] Patient Information 2014 [place], [place].

## 2012-08-17 ENCOUNTER — Ambulatory Visit (HOSPITAL_BASED_OUTPATIENT_CLINIC_OR_DEPARTMENT_OTHER): Payer: Medicare Other

## 2012-08-17 ENCOUNTER — Other Ambulatory Visit: Payer: Self-pay | Admitting: Internal Medicine

## 2012-08-17 VITALS — BP 140/68 | HR 75 | Temp 97.5°F | Resp 18

## 2012-08-17 DIAGNOSIS — C9000 Multiple myeloma not having achieved remission: Secondary | ICD-10-CM

## 2012-08-17 DIAGNOSIS — Z5112 Encounter for antineoplastic immunotherapy: Secondary | ICD-10-CM

## 2012-08-17 LAB — TYPE AND SCREEN
ABO/RH(D): AB POS
Unit division: 0

## 2012-08-17 MED ORDER — DEXTROSE 5 % IV SOLN
20.0000 mg/m2 | Freq: Once | INTRAVENOUS | Status: AC
  Administered 2012-08-17: 40 mg via INTRAVENOUS
  Filled 2012-08-17: qty 20

## 2012-08-17 MED ORDER — ONDANSETRON 8 MG/50ML IVPB (CHCC)
8.0000 mg | Freq: Once | INTRAVENOUS | Status: AC
  Administered 2012-08-17: 8 mg via INTRAVENOUS

## 2012-08-17 MED ORDER — DEXAMETHASONE SODIUM PHOSPHATE 10 MG/ML IJ SOLN
10.0000 mg | Freq: Once | INTRAMUSCULAR | Status: AC
  Administered 2012-08-17: 10 mg via INTRAVENOUS

## 2012-08-17 MED ORDER — SODIUM CHLORIDE 0.9 % IV SOLN
Freq: Once | INTRAVENOUS | Status: AC
  Administered 2012-08-17: 10:00:00 via INTRAVENOUS

## 2012-08-17 NOTE — Patient Instructions (Signed)
[facility] Discharge Instructions for Patients Receiving Chemotherapy  Today you received the following chemotherapy agents :  Kyprolis.  To help prevent nausea and vomiting after your treatment, we encourage you to take your nausea medication as instructed by your physician.   If you develop nausea and vomiting that is not controlled by your nausea medication, call the clinic.   BELOW ARE SYMPTOMS THAT SHOULD BE REPORTED IMMEDIATELY:  *FEVER GREATER THAN 100.5 F  *CHILLS WITH OR WITHOUT FEVER  NAUSEA AND VOMITING THAT IS NOT CONTROLLED WITH YOUR NAUSEA MEDICATION  *UNUSUAL SHORTNESS OF BREATH  *UNUSUAL BRUISING OR BLEEDING  TENDERNESS IN MOUTH AND THROAT WITH OR WITHOUT PRESENCE OF ULCERS  *URINARY PROBLEMS  *BOWEL PROBLEMS  UNUSUAL RASH Items with * indicate a potential emergency and should be followed up as soon as possible.  Feel free to call the clinic you have any questions or concerns. The clinic phone number is [phone].    

## 2012-08-23 ENCOUNTER — Telehealth: Payer: Self-pay | Admitting: Internal Medicine

## 2012-08-23 ENCOUNTER — Ambulatory Visit (HOSPITAL_BASED_OUTPATIENT_CLINIC_OR_DEPARTMENT_OTHER): Payer: Medicare Other

## 2012-08-23 ENCOUNTER — Other Ambulatory Visit (HOSPITAL_BASED_OUTPATIENT_CLINIC_OR_DEPARTMENT_OTHER): Payer: Medicare Other | Admitting: Lab

## 2012-08-23 ENCOUNTER — Encounter: Payer: Self-pay | Admitting: Internal Medicine

## 2012-08-23 ENCOUNTER — Ambulatory Visit (HOSPITAL_BASED_OUTPATIENT_CLINIC_OR_DEPARTMENT_OTHER): Payer: Medicare Other | Admitting: Internal Medicine

## 2012-08-23 VITALS — BP 146/82 | HR 61 | Temp 97.9°F | Resp 18 | Ht 72.0 in | Wt 165.3 lb

## 2012-08-23 DIAGNOSIS — C9000 Multiple myeloma not having achieved remission: Secondary | ICD-10-CM

## 2012-08-23 DIAGNOSIS — Z5111 Encounter for antineoplastic chemotherapy: Secondary | ICD-10-CM

## 2012-08-23 DIAGNOSIS — Z5112 Encounter for antineoplastic immunotherapy: Secondary | ICD-10-CM

## 2012-08-23 LAB — CBC WITH DIFFERENTIAL/PLATELET
BASO%: 0 % (ref 0.0–2.0)
Basophils Absolute: 0 10*3/uL (ref 0.0–0.1)
HCT: 28.8 % — ABNORMAL LOW (ref 38.4–49.9)
HGB: 9.3 g/dL — ABNORMAL LOW (ref 13.0–17.1)
LYMPH%: 20 % (ref 14.0–49.0)
MCH: 23.7 pg — ABNORMAL LOW (ref 27.2–33.4)
MCHC: 32.3 g/dL (ref 32.0–36.0)
MONO#: 0.2 10*3/uL (ref 0.1–0.9)
NEUT%: 71.2 % (ref 39.0–75.0)
Platelets: 50 10*3/uL — ABNORMAL LOW (ref 140–400)
WBC: 2.5 10*3/uL — ABNORMAL LOW (ref 4.0–10.3)

## 2012-08-23 LAB — COMPREHENSIVE METABOLIC PANEL (CC13)
AST: 13 U/L (ref 5–34)
Albumin: 2.9 g/dL — ABNORMAL LOW (ref 3.5–5.0)
BUN: 13.3 mg/dL (ref 7.0–26.0)
CO2: 26 mEq/L (ref 22–29)
Calcium: 8.8 mg/dL (ref 8.4–10.4)
Chloride: 103 mEq/L (ref 98–109)
Creatinine: 1.2 mg/dL (ref 0.7–1.3)
Glucose: 215 mg/dl — ABNORMAL HIGH (ref 70–140)
Potassium: 3.5 mEq/L (ref 3.5–5.1)

## 2012-08-23 MED ORDER — SODIUM CHLORIDE 0.9 % IV SOLN
250.0000 mg/m2 | Freq: Once | INTRAVENOUS | Status: AC
  Administered 2012-08-23: 500 mg via INTRAVENOUS
  Filled 2012-08-23: qty 25

## 2012-08-23 MED ORDER — SODIUM CHLORIDE 0.9 % IV SOLN
Freq: Once | INTRAVENOUS | Status: AC
  Administered 2012-08-23: 14:00:00 via INTRAVENOUS

## 2012-08-23 MED ORDER — DEXTROSE 5 % IV SOLN
20.0000 mg/m2 | Freq: Once | INTRAVENOUS | Status: AC
  Administered 2012-08-23: 40 mg via INTRAVENOUS
  Filled 2012-08-23: qty 20

## 2012-08-23 MED ORDER — DEXAMETHASONE SODIUM PHOSPHATE 10 MG/ML IJ SOLN
10.0000 mg | Freq: Once | INTRAMUSCULAR | Status: AC
  Administered 2012-08-23: 10 mg via INTRAVENOUS

## 2012-08-23 MED ORDER — ONDANSETRON 8 MG/50ML IVPB (CHCC)
8.0000 mg | Freq: Once | INTRAVENOUS | Status: AC
  Administered 2012-08-23: 8 mg via INTRAVENOUS

## 2012-08-23 MED ORDER — SODIUM CHLORIDE 0.9 % IV SOLN
Freq: Once | INTRAVENOUS | Status: AC
  Administered 2012-08-23: 11:00:00 via INTRAVENOUS

## 2012-08-23 NOTE — Patient Instructions (Signed)
Continue treatment with Carfilzomib, Decadron and Cytoxan. Followup visit in 2 weeks

## 2012-08-23 NOTE — Progress Notes (Signed)
Ok to treat per MD with PLT of 50

## 2012-08-23 NOTE — Patient Instructions (Signed)
[facility] Discharge Instructions for Patients Receiving Chemotherapy  Today you received the following chemotherapy agents Cytoxan/Kryprolis To help prevent nausea and vomiting after your treatment, we encourage you to take your nausea medication as directed   If you develop nausea and vomiting that is not controlled by your nausea medication, call the clinic.   BELOW ARE SYMPTOMS THAT SHOULD BE REPORTED IMMEDIATELY:  *FEVER GREATER THAN 100.5 F  *CHILLS WITH OR WITHOUT FEVER  NAUSEA AND VOMITING THAT IS NOT CONTROLLED WITH YOUR NAUSEA MEDICATION  *UNUSUAL SHORTNESS OF BREATH  *UNUSUAL BRUISING OR BLEEDING  TENDERNESS IN MOUTH AND THROAT WITH OR WITHOUT PRESENCE OF ULCERS  *URINARY PROBLEMS  *BOWEL PROBLEMS  UNUSUAL RASH Items with * indicate a potential emergency and should be followed up as soon as possible.  Feel free to call the clinic you have any questions or concerns. The clinic phone number is [phone].    

## 2012-08-23 NOTE — Telephone Encounter (Signed)
gv pt appt schedule for August and September.  °

## 2012-08-23 NOTE — Progress Notes (Signed)
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   PRIOR THERAPY: :  1. Status post 6 cycles of systemic chemotherapy with Revlimid and Decadron, last dose was given 07/21/2010 with very good response. 2. 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 Lance Bosch.  3. maintenance Revlimid at 10 mg by mouth daily status post 2 months. Therapy began 01/18/2011. 4. maintenance Revlimid at 15 mg by mouth daily with prophylactic dose Coumadin at 2 mg by mouth daily. 5. Systemic chemotherapy with Velcade at 1.3 mg per meter squared given on days 1, 4, 8 and 11 and Doxil at 30 mg per meter square given on day 4 and Decadron 40 mg by mouth on weekly basis given every 3 weeks. Status post 4 cycles. 6. Zometa 4 mg IV every 4 weeks.  7. Velcade 1.3 mg/M2 subcutaneous daily on a weekly basis with Decadron 20 mg by mouth on a weekly basis. First cycle expected on 06/21/2012. S/P 4 cycles.  CURRENT THERAPY:  1) Systemic chemotherapy with Carfilzomib, cyclophosphamide and Decadron. First cycle started on 08/09/2012.    INTERVAL HISTORY: Eddie Thomas 67 y.o. male returns to the clinic today for followup visit. The patient is feeling fine today with no specific complaints. He is tolerating his current treatment with Carfilzomib, cyclophosphamide and Decadron fairly well. He denied having any significant nausea or vomiting. He has no fever or chills. The patient denied having any significant weight loss or night sweats. He has no bleeding issues. He denied having any significant chest pain, shortness of breath, cough or hemoptysis.   MEDICAL HISTORY: Past Medical History  Diagnosis Date  . Multiple myeloma   . Diabetes mellitus 07/03/2011  .  Hypertension 07/03/2011  . Hyperlipidemia 07/03/2011  . Colon polyps 2012  . Gastric ulcer     ALLERGIES:  has No Known Allergies.  MEDICATIONS:  Current Outpatient Prescriptions  Medication Sig Dispense Refill  . aspirin 81 MG tablet Take 81 mg by mouth daily.      [name] azithromycin (ZITHROMAX) 250 MG tablet TAKE PER PACKAGE DIRECTIONS.  6 each  0  . dexamethasone (DECADRON) 4 MG tablet 20 Milligram by mouth every week start with the first dose of chemotherapy  80 tablet  1  . ezetimibe (ZETIA) 10 MG tablet Take 10 mg by mouth daily.        . haemophilus B polysaccharide conjugate vaccine (ACTHIB) injection Inject 0.5 mLs into the muscle once.  1 each  0  . lisinopril (PRINIVIL,ZESTRIL) 10 MG tablet Take 5 mg by mouth daily.       . pantoprazole (PROTONIX) 40 MG tablet Take 1 tablet (40 mg total) by mouth 2 (two) times daily.  60 tablet  11  . pioglitazone (ACTOS) 15 MG tablet Take 15 mg by mouth daily.      . prochlorperazine (COMPAZINE) 10 MG tablet Take 1 tablet (10 mg total) by mouth every 6 (six) hours as needed.  30 tablet  0  . simvastatin (ZOCOR) 10 MG tablet Take 10 mg by mouth at bedtime.        . valACYclovir (VALTREX) 500 MG tablet Take 1 tablet (500 mg total) by mouth daily.  30 tablet  3   No current facility-administered medications for this  visit.    SURGICAL HISTORY:  Past Surgical History  Procedure Laterality Date  . Limbal stem cell transplant    . Humerus fracture surgery      right    REVIEW OF SYSTEMS:  A comprehensive review of systems was negative.   PHYSICAL EXAMINATION: General appearance: alert, cooperative and no distress Head: Normocephalic, without obvious abnormality, atraumatic Neck: no adenopathy Lymph nodes: Cervical, supraclavicular, and axillary nodes normal. Resp: clear to auscultation bilaterally Cardio: regular rate and rhythm, S1, S2 normal, no murmur, click, rub or gallop GI: soft, non-tender; bowel sounds normal; no masses,  no  organomegaly Extremities: extremities normal, atraumatic, no cyanosis or edema  ECOG PERFORMANCE STATUS: 1 - Symptomatic but completely ambulatory  Blood pressure 146/82, pulse 61, temperature 97.9 F (36.6 C), temperature source Oral, resp. rate 18, height 6' (1.829 m), weight 165 lb 4.8 oz (74.98 kg).  LABORATORY DATA: Lab Results  Component Value Date   WBC 2.5* 08/23/2012   HGB 9.3* 08/23/2012   HCT 28.8* 08/23/2012   MCV 73.3* 08/23/2012   PLT 50* 08/23/2012      Chemistry      Component Value Date/Time   NA 144 08/16/2012 0810   NA 137 02/04/2012 2331   K 3.6 08/16/2012 0810   K 3.8 02/04/2012 2331   CL 103 07/12/2012 0907   CL 91* 02/04/2012 2331   CO2 21* 08/16/2012 0810   CO2 34* 02/04/2012 2331   BUN 15.2 08/16/2012 0810   BUN 28* 02/04/2012 2331   CREATININE 1.5* 08/16/2012 0810   CREATININE 1.10 02/04/2012 2331      Component Value Date/Time   CALCIUM 8.9 08/16/2012 0810   CALCIUM 9.5 02/04/2012 2331   ALKPHOS 49 08/16/2012 0810   ALKPHOS 71 02/04/2012 2331   AST 25 08/16/2012 0810   AST 18 02/04/2012 2331   ALT 16 08/16/2012 0810   ALT 16 02/04/2012 2331   BILITOT 1.42* 08/16/2012 0810   BILITOT 1.3* 02/04/2012 2331       RADIOGRAPHIC STUDIES: No results found.  ASSESSMENT AND PLAN: This is a very pleasant 67 years old [place] American male with history of multiple myeloma currently undergoing systemic chemotherapy with Carfilzomib, Cytoxan and Decadron. He is tolerating his treatment fairly well but continues to have persistent thrombocytopenia, which could be secondary to his progressive multiple myeloma versus treatment. I recommended for the patient to continue his current treatment with day 15 and 16 of this cycle. He would come back for followup visit in 2 weeks with the start of cycle #2. He was advised to call immediately if she has any concerning symptoms in the interval.  The patient voices understanding of current disease status and treatment options and is in  agreement with the current care plan.  All questions were answered. The patient knows to call the clinic with any problems, questions or concerns. We can certainly see the patient much sooner if necessary.

## 2012-08-23 NOTE — Progress Notes (Signed)
Most recent office notes faxed to [name], PA per pt request.  SLJ

## 2012-08-24 ENCOUNTER — Ambulatory Visit (HOSPITAL_BASED_OUTPATIENT_CLINIC_OR_DEPARTMENT_OTHER): Payer: Medicare Other

## 2012-08-24 VITALS — BP 144/81 | HR 63 | Temp 98.0°F | Resp 20

## 2012-08-24 DIAGNOSIS — C9000 Multiple myeloma not having achieved remission: Secondary | ICD-10-CM

## 2012-08-24 DIAGNOSIS — Z5112 Encounter for antineoplastic immunotherapy: Secondary | ICD-10-CM

## 2012-08-24 MED ORDER — ONDANSETRON 8 MG/50ML IVPB (CHCC)
8.0000 mg | Freq: Once | INTRAVENOUS | Status: AC
  Administered 2012-08-24: 8 mg via INTRAVENOUS

## 2012-08-24 MED ORDER — DEXAMETHASONE SODIUM PHOSPHATE 10 MG/ML IJ SOLN
10.0000 mg | Freq: Once | INTRAMUSCULAR | Status: AC
  Administered 2012-08-24: 10 mg via INTRAVENOUS

## 2012-08-24 MED ORDER — DEXTROSE 5 % IV SOLN
20.0000 mg/m2 | Freq: Once | INTRAVENOUS | Status: AC
  Administered 2012-08-24: 40 mg via INTRAVENOUS
  Filled 2012-08-24: qty 20

## 2012-08-24 MED ORDER — SODIUM CHLORIDE 0.9 % IV SOLN
Freq: Once | INTRAVENOUS | Status: AC
  Administered 2012-08-24: 09:00:00 via INTRAVENOUS

## 2012-08-24 NOTE — Patient Instructions (Signed)
[facility] Discharge Instructions for Patients Receiving Chemotherapy  Today you received the following chemotherapy agents :  Kyprolis.  To help prevent nausea and vomiting after your treatment, we encourage you to take your nausea medication as instructed by your physician.   If you develop nausea and vomiting that is not controlled by your nausea medication, call the clinic.   BELOW ARE SYMPTOMS THAT SHOULD BE REPORTED IMMEDIATELY:  *FEVER GREATER THAN 100.5 F  *CHILLS WITH OR WITHOUT FEVER  NAUSEA AND VOMITING THAT IS NOT CONTROLLED WITH YOUR NAUSEA MEDICATION  *UNUSUAL SHORTNESS OF BREATH  *UNUSUAL BRUISING OR BLEEDING  TENDERNESS IN MOUTH AND THROAT WITH OR WITHOUT PRESENCE OF ULCERS  *URINARY PROBLEMS  *BOWEL PROBLEMS  UNUSUAL RASH Items with * indicate a potential emergency and should be followed up as soon as possible.  Feel free to call the clinic you have any questions or concerns. The clinic phone number is [phone].    

## 2012-08-25 ENCOUNTER — Other Ambulatory Visit: Payer: Self-pay

## 2012-08-30 ENCOUNTER — Other Ambulatory Visit (HOSPITAL_BASED_OUTPATIENT_CLINIC_OR_DEPARTMENT_OTHER): Payer: Medicare Other | Admitting: Lab

## 2012-08-30 DIAGNOSIS — C9000 Multiple myeloma not having achieved remission: Secondary | ICD-10-CM

## 2012-08-30 LAB — COMPREHENSIVE METABOLIC PANEL (CC13)
AST: 12 U/L (ref 5–34)
Albumin: 3 g/dL — ABNORMAL LOW (ref 3.5–5.0)
BUN: 11.1 mg/dL (ref 7.0–26.0)
CO2: 27 mEq/L (ref 22–29)
Calcium: 8.9 mg/dL (ref 8.4–10.4)
Chloride: 105 mEq/L (ref 98–109)
Creatinine: 1.1 mg/dL (ref 0.7–1.3)
Potassium: 3.7 mEq/L (ref 3.5–5.1)

## 2012-08-30 LAB — CBC WITH DIFFERENTIAL/PLATELET
Basophils Absolute: 0 10*3/uL (ref 0.0–0.1)
EOS%: 0.5 % (ref 0.0–7.0)
Eosinophils Absolute: 0 10*3/uL (ref 0.0–0.5)
HCT: 26.4 % — ABNORMAL LOW (ref 38.4–49.9)
HGB: 8.7 g/dL — ABNORMAL LOW (ref 13.0–17.1)
MONO#: 0.3 10*3/uL (ref 0.1–0.9)
NEUT#: 1.5 10*3/uL (ref 1.5–6.5)
NEUT%: 61.7 % (ref 39.0–75.0)
RDW: 19.1 % — ABNORMAL HIGH (ref 11.0–14.6)
WBC: 2.5 10*3/uL — ABNORMAL LOW (ref 4.0–10.3)
lymph#: 0.6 10*3/uL — ABNORMAL LOW (ref 0.9–3.3)

## 2012-09-06 ENCOUNTER — Ambulatory Visit (HOSPITAL_BASED_OUTPATIENT_CLINIC_OR_DEPARTMENT_OTHER): Payer: Medicare Other | Admitting: Internal Medicine

## 2012-09-06 ENCOUNTER — Other Ambulatory Visit (HOSPITAL_BASED_OUTPATIENT_CLINIC_OR_DEPARTMENT_OTHER): Payer: Medicare Other | Admitting: Lab

## 2012-09-06 ENCOUNTER — Ambulatory Visit (HOSPITAL_BASED_OUTPATIENT_CLINIC_OR_DEPARTMENT_OTHER): Payer: Medicare Other

## 2012-09-06 ENCOUNTER — Other Ambulatory Visit: Payer: Medicare Other | Admitting: Lab

## 2012-09-06 ENCOUNTER — Telehealth: Payer: Self-pay | Admitting: *Deleted

## 2012-09-06 ENCOUNTER — Encounter: Payer: Self-pay | Admitting: Internal Medicine

## 2012-09-06 ENCOUNTER — Telehealth: Payer: Self-pay | Admitting: Internal Medicine

## 2012-09-06 VITALS — BP 143/73 | HR 65 | Temp 98.3°F | Resp 20 | Ht 72.0 in | Wt 164.7 lb

## 2012-09-06 DIAGNOSIS — C9000 Multiple myeloma not having achieved remission: Secondary | ICD-10-CM

## 2012-09-06 DIAGNOSIS — Z5111 Encounter for antineoplastic chemotherapy: Secondary | ICD-10-CM

## 2012-09-06 DIAGNOSIS — Z5112 Encounter for antineoplastic immunotherapy: Secondary | ICD-10-CM

## 2012-09-06 LAB — COMPREHENSIVE METABOLIC PANEL (CC13)
AST: 14 U/L (ref 5–34)
Albumin: 3.6 g/dL (ref 3.5–5.0)
Alkaline Phosphatase: 78 U/L (ref 40–150)
Calcium: 9.5 mg/dL (ref 8.4–10.4)
Chloride: 104 mEq/L (ref 98–109)
Potassium: 4.8 mEq/L (ref 3.5–5.1)
Sodium: 139 mEq/L (ref 136–145)
Total Protein: 7.5 g/dL (ref 6.4–8.3)

## 2012-09-06 LAB — CBC WITH DIFFERENTIAL/PLATELET
BASO%: 0.3 % (ref 0.0–2.0)
Basophils Absolute: 0 10*3/uL (ref 0.0–0.1)
EOS%: 0.6 % (ref 0.0–7.0)
HCT: 29.6 % — ABNORMAL LOW (ref 38.4–49.9)
HGB: 9.5 g/dL — ABNORMAL LOW (ref 13.0–17.1)
MCH: 24.4 pg — ABNORMAL LOW (ref 27.2–33.4)
MCHC: 32.1 g/dL (ref 32.0–36.0)
MONO#: 0.5 10*3/uL (ref 0.1–0.9)
NEUT%: 55 % (ref 39.0–75.0)
RDW: 18.5 % — ABNORMAL HIGH (ref 11.0–14.6)
WBC: 3.3 10*3/uL — ABNORMAL LOW (ref 4.0–10.3)
lymph#: 1 10*3/uL (ref 0.9–3.3)

## 2012-09-06 MED ORDER — SODIUM CHLORIDE 0.9 % IV SOLN
Freq: Once | INTRAVENOUS | Status: AC
  Administered 2012-09-06: 12:00:00 via INTRAVENOUS

## 2012-09-06 MED ORDER — DEXTROSE 5 % IV SOLN
20.0000 mg/m2 | Freq: Once | INTRAVENOUS | Status: AC
  Administered 2012-09-06: 40 mg via INTRAVENOUS
  Filled 2012-09-06: qty 20

## 2012-09-06 MED ORDER — SODIUM CHLORIDE 0.9 % IV SOLN
300.0000 mg/m2 | Freq: Once | INTRAVENOUS | Status: AC
  Administered 2012-09-06: 600 mg via INTRAVENOUS
  Filled 2012-09-06: qty 30

## 2012-09-06 MED ORDER — ZOLEDRONIC ACID 4 MG/5ML IV CONC: 4.0000 mg | Freq: Once | INTRAVENOUS | Status: DC

## 2012-09-06 MED ORDER — ZOLEDRONIC ACID 4 MG/100ML IV SOLN
4.0000 mg | Freq: Once | INTRAVENOUS | Status: AC
  Administered 2012-09-06: 4 mg via INTRAVENOUS
  Filled 2012-09-06: qty 100

## 2012-09-06 MED ORDER — ONDANSETRON 8 MG/50ML IVPB (CHCC)
8.0000 mg | Freq: Once | INTRAVENOUS | Status: AC
  Administered 2012-09-06: 8 mg via INTRAVENOUS

## 2012-09-06 MED ORDER — DEXAMETHASONE SODIUM PHOSPHATE 10 MG/ML IJ SOLN
10.0000 mg | Freq: Once | INTRAMUSCULAR | Status: AC
  Administered 2012-09-06: 10 mg via INTRAVENOUS

## 2012-09-06 MED ORDER — SODIUM CHLORIDE 0.9 % IV SOLN
Freq: Once | INTRAVENOUS | Status: AC
  Administered 2012-09-06: 11:00:00 via INTRAVENOUS

## 2012-09-06 NOTE — Telephone Encounter (Signed)
Per staff message and POF I have scheduled appts.  No openings on 9/2 notified the scheduler to please advise new apptJMW

## 2012-09-06 NOTE — Telephone Encounter (Signed)
gv and printed appt sched and avs for pt....emeialed MW to add tx

## 2012-09-06 NOTE — Progress Notes (Signed)
Per Pharmacy and per Dr. Arbutus Ped, OK to treat with Zometa today due to creatinine 1.1 Clayborn Heron, RN

## 2012-09-06 NOTE — Progress Notes (Signed)
[facility] Telephone:([phone]) [phone]   [phone]  OFFICE PROGRESS NOTE  Eddie Thomas [address] 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   PRIOR THERAPY: :  1. Status post 6 cycles of systemic chemotherapy with Revlimid and Decadron, last dose was given 07/21/2010 with very good response. 2. 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 Lance Bosch.  3. maintenance Revlimid at 10 mg by mouth daily status post 2 months. Therapy began 01/18/2011. 4. maintenance Revlimid at 15 mg by mouth daily with prophylactic dose Coumadin at 2 mg by mouth daily. 5. Systemic chemotherapy with Velcade at 1.3 mg per meter squared given on days 1, 4, 8 and 11 and Doxil at 30 mg per meter square given on day 4 and Decadron 40 mg by mouth on weekly basis given every 3 weeks. Status post 4 cycles. 6. Zometa 4 mg IV every 4 weeks.  7. Velcade 1.3 mg/M2 subcutaneous daily on a weekly basis with Decadron 20 mg by mouth on a weekly basis. First cycle expected on 06/21/2012. S/P 4 cycles.  CURRENT THERAPY:  1) Systemic chemotherapy with Carfilzomib, cyclophosphamide and Decadron, status post 1 cycle. First cycle started on 08/09/2012.   INTERVAL HISTORY: Eddie Thomas 67 y.o. male returns to the clinic today for followup visit. The patient is feeling fine today with no specific complaints. He tolerated the last cycle of his treatment with Carfilzomib, cyclophosphamide and Decadron fairly well. He denied having any significant fever or chills. He denied having any chest pain, shortness breath, cough or hemoptysis. He has no nausea or vomiting. The patient denied having any significant peripheral neuropathy. He is here today to start cycle #2 of his chemotherapy.   MEDICAL HISTORY: Past Medical History  Diagnosis Date  . Multiple myeloma   . Diabetes  mellitus 07/03/2011  . Hypertension 07/03/2011  . Hyperlipidemia 07/03/2011  . Colon polyps 2012  . Gastric ulcer     ALLERGIES:  has No Known Allergies.  MEDICATIONS:  Current Outpatient Prescriptions  Medication Sig Dispense Refill  . aspirin 81 MG tablet Take 81 mg by mouth daily.      [name] dexamethasone (DECADRON) 4 MG tablet 20 Milligram by mouth every week start with the first dose of chemotherapy  80 tablet  1  . ezetimibe (ZETIA) 10 MG tablet Take 10 mg by mouth daily.        . haemophilus B polysaccharide conjugate vaccine (ACTHIB) injection Inject 0.5 mLs into the muscle once.  1 each  0  . lisinopril (PRINIVIL,ZESTRIL) 10 MG tablet Take 5 mg by mouth daily.       . pantoprazole (PROTONIX) 40 MG tablet Take 1 tablet (40 mg total) by mouth 2 (two) times daily.  60 tablet  11  . pioglitazone (ACTOS) 15 MG tablet Take 15 mg by mouth daily.      . prochlorperazine (COMPAZINE) 10 MG tablet Take 1 tablet (10 mg total) by mouth every 6 (six) hours as needed.  30 tablet  0  . simvastatin (ZOCOR) 10 MG tablet Take 10 mg by mouth at bedtime.        . valACYclovir (VALTREX) 500 MG tablet Take 1 tablet (500 mg total) by mouth daily.  30 tablet  3   No current facility-administered medications for this visit.    SURGICAL HISTORY:  Past Surgical History  Procedure Laterality Date  . Limbal stem cell transplant    . Humerus fracture surgery      right    REVIEW OF SYSTEMS:  A comprehensive review of systems was negative except for: Constitutional: positive for fatigue   PHYSICAL EXAMINATION: General appearance: alert, cooperative and no distress Head: Normocephalic, without obvious abnormality, atraumatic Neck: no adenopathy Lymph nodes: Cervical, supraclavicular, and axillary nodes normal. Resp: clear to auscultation bilaterally Cardio: regular rate and rhythm, S1, S2 normal, no murmur, click, rub or gallop GI: soft, non-tender; bowel sounds normal; no masses,  no  organomegaly Extremities: extremities normal, atraumatic, no cyanosis or edema  ECOG PERFORMANCE STATUS: 1 - Symptomatic but completely ambulatory  Blood pressure 143/73, pulse 65, temperature 98.3 F (36.8 C), temperature source Oral, resp. rate 20, height 6' (1.829 m), weight 164 lb 11.2 oz (74.707 kg).  LABORATORY DATA: Lab Results  Component Value Date   WBC 3.3* 09/06/2012   HGB 9.5* 09/06/2012   HCT 29.6* 09/06/2012   MCV 76.1* 09/06/2012   PLT 199 09/06/2012      Chemistry      Component Value Date/Time   NA 143 08/30/2012 0952   NA 137 02/04/2012 2331   K 3.7 08/30/2012 0952   K 3.8 02/04/2012 2331   CL 103 07/12/2012 0907   CL 91* 02/04/2012 2331   CO2 27 08/30/2012 0952   CO2 34* 02/04/2012 2331   BUN 11.1 08/30/2012 0952   BUN 28* 02/04/2012 2331   CREATININE 1.1 08/30/2012 0952   CREATININE 1.10 02/04/2012 2331      Component Value Date/Time   CALCIUM 8.9 08/30/2012 0952   CALCIUM 9.5 02/04/2012 2331   ALKPHOS 73 08/30/2012 0952   ALKPHOS 71 02/04/2012 2331   AST 12 08/30/2012 0952   AST 18 02/04/2012 2331   ALT 10 08/30/2012 0952   ALT 16 02/04/2012 2331   BILITOT 1.33* 08/30/2012 0952   BILITOT 1.3* 02/04/2012 2331       RADIOGRAPHIC STUDIES: No results found.  ASSESSMENT AND PLAN: This is a very pleasant 67 years old [place] American male with multiple myeloma currently undergoing systemic chemotherapy with Carfilzomib, cyclophosphamide and Decadron status post 1 cycle. The patient related the first cycle fairly well with no significant adverse effects. I recommended for him to proceed with cycle #2 today as scheduled. He would come back for followup visit in 2 weeks for evaluation and management any adverse effect of his chemotherapy. The patient was advised to call immediately she has any concerning symptoms in the interval. The patient voices understanding of current disease status and treatment options and is in agreement with the current care plan.  All questions  were answered. The patient knows to call the clinic with any problems, questions or concerns. We can certainly see the patient much sooner if necessary.

## 2012-09-06 NOTE — Patient Instructions (Addendum)
[facility] Discharge Instructions for Patients Receiving Chemotherapy  Today you received the following chemotherapy agents: Cytoxan, Kyprolis, Zometa  To help prevent nausea and vomiting after your treatment, we encourage you to take your nausea medication as instructed by your physician.   If you develop nausea and vomiting that is not controlled by your nausea medication, call the clinic.   BELOW ARE SYMPTOMS THAT SHOULD BE REPORTED IMMEDIATELY:  *FEVER GREATER THAN 100.5 F  *CHILLS WITH OR WITHOUT FEVER  NAUSEA AND VOMITING THAT IS NOT CONTROLLED WITH YOUR NAUSEA MEDICATION  *UNUSUAL SHORTNESS OF BREATH  *UNUSUAL BRUISING OR BLEEDING  TENDERNESS IN MOUTH AND THROAT WITH OR WITHOUT PRESENCE OF ULCERS  *URINARY PROBLEMS  *BOWEL PROBLEMS  UNUSUAL RASH Items with * indicate a potential emergency and should be followed up as soon as possible.  Feel free to call the clinic you have any questions or concerns. The clinic phone number is [phone].

## 2012-09-07 ENCOUNTER — Other Ambulatory Visit: Payer: Self-pay | Admitting: *Deleted

## 2012-09-07 ENCOUNTER — Telehealth: Payer: Self-pay | Admitting: Internal Medicine

## 2012-09-07 ENCOUNTER — Ambulatory Visit (HOSPITAL_BASED_OUTPATIENT_CLINIC_OR_DEPARTMENT_OTHER): Payer: Medicare Other

## 2012-09-07 ENCOUNTER — Telehealth: Payer: Self-pay | Admitting: *Deleted

## 2012-09-07 VITALS — BP 148/79 | HR 67 | Temp 98.0°F

## 2012-09-07 DIAGNOSIS — Z5112 Encounter for antineoplastic immunotherapy: Secondary | ICD-10-CM

## 2012-09-07 DIAGNOSIS — C9000 Multiple myeloma not having achieved remission: Secondary | ICD-10-CM

## 2012-09-07 MED ORDER — ONDANSETRON 8 MG/50ML IVPB (CHCC)
8.0000 mg | Freq: Once | INTRAVENOUS | Status: AC
  Administered 2012-09-07: 8 mg via INTRAVENOUS

## 2012-09-07 MED ORDER — SODIUM CHLORIDE 0.9 % IV SOLN
Freq: Once | INTRAVENOUS | Status: AC
  Administered 2012-09-07: 09:00:00 via INTRAVENOUS

## 2012-09-07 MED ORDER — DEXTROSE 5 % IV SOLN
20.0000 mg/m2 | Freq: Once | INTRAVENOUS | Status: AC
  Administered 2012-09-07: 40 mg via INTRAVENOUS
  Filled 2012-09-07: qty 20

## 2012-09-07 MED ORDER — DEXAMETHASONE SODIUM PHOSPHATE 10 MG/ML IJ SOLN
10.0000 mg | Freq: Once | INTRAMUSCULAR | Status: AC
  Administered 2012-09-07: 10 mg via INTRAVENOUS

## 2012-09-07 NOTE — Patient Instructions (Addendum)
[facility] Discharge Instructions for Patients Receiving Chemotherapy  Today you received the following chemotherapy agent: Carfilzomib  To help prevent nausea and vomiting after your treatment, we encourage you to take your nausea medication : compazine 10 mg every 6 hours as needed   If you develop nausea and vomiting that is not controlled by your nausea medication, call the clinic.   BELOW ARE SYMPTOMS THAT SHOULD BE REPORTED IMMEDIATELY:  *FEVER GREATER THAN 100.5 F  *CHILLS WITH OR WITHOUT FEVER  NAUSEA AND VOMITING THAT IS NOT CONTROLLED WITH YOUR NAUSEA MEDICATION  *UNUSUAL SHORTNESS OF BREATH  *UNUSUAL BRUISING OR BLEEDING  TENDERNESS IN MOUTH AND THROAT WITH OR WITHOUT PRESENCE OF ULCERS  *URINARY PROBLEMS  *BOWEL PROBLEMS  UNUSUAL RASH Items with * indicate a potential emergency and should be followed up as soon as possible.  Feel free to call the clinic you have any questions or concerns. The clinic phone number is [phone].  It has been a pleasure to serve you today.

## 2012-09-07 NOTE — Progress Notes (Signed)
Confirmed infusion time of 30 minutes with pharmacist of carfilzomib.

## 2012-09-07 NOTE — Telephone Encounter (Signed)
Per staff message I have scheduled apapt

## 2012-09-07 NOTE — Telephone Encounter (Signed)
s.w. pt and advise don 9.2.14 appt move to 9.3.14...ok and aware

## 2012-09-13 ENCOUNTER — Other Ambulatory Visit (HOSPITAL_BASED_OUTPATIENT_CLINIC_OR_DEPARTMENT_OTHER): Payer: Medicare Other | Admitting: Lab

## 2012-09-13 ENCOUNTER — Ambulatory Visit (HOSPITAL_BASED_OUTPATIENT_CLINIC_OR_DEPARTMENT_OTHER): Payer: Medicare Other

## 2012-09-13 VITALS — BP 136/71 | HR 63 | Temp 98.1°F

## 2012-09-13 DIAGNOSIS — C9000 Multiple myeloma not having achieved remission: Secondary | ICD-10-CM

## 2012-09-13 DIAGNOSIS — Z5112 Encounter for antineoplastic immunotherapy: Secondary | ICD-10-CM

## 2012-09-13 LAB — CBC WITH DIFFERENTIAL/PLATELET
BASO%: 0.2 % (ref 0.0–2.0)
Basophils Absolute: 0 10*3/uL (ref 0.0–0.1)
HCT: 30 % — ABNORMAL LOW (ref 38.4–49.9)
HGB: 9.8 g/dL — ABNORMAL LOW (ref 13.0–17.1)
MCHC: 32.7 g/dL (ref 32.0–36.0)
MONO#: 0.4 10*3/uL (ref 0.1–0.9)
NEUT#: 3.3 10*3/uL (ref 1.5–6.5)
NEUT%: 71.8 % (ref 39.0–75.0)
WBC: 4.5 10*3/uL (ref 4.0–10.3)
lymph#: 0.9 10*3/uL (ref 0.9–3.3)

## 2012-09-13 LAB — COMPREHENSIVE METABOLIC PANEL (CC13)
AST: 13 U/L (ref 5–34)
Albumin: 3.5 g/dL (ref 3.5–5.0)
BUN: 9.8 mg/dL (ref 7.0–26.0)
Calcium: 9.4 mg/dL (ref 8.4–10.4)
Chloride: 103 mEq/L (ref 98–109)
Potassium: 4.2 mEq/L (ref 3.5–5.1)

## 2012-09-13 MED ORDER — DEXTROSE 5 % IV SOLN
20.0000 mg/m2 | Freq: Once | INTRAVENOUS | Status: AC
  Administered 2012-09-13: 40 mg via INTRAVENOUS
  Filled 2012-09-13: qty 20

## 2012-09-13 MED ORDER — SODIUM CHLORIDE 0.9 % IV SOLN: Freq: Once | INTRAVENOUS | Status: DC

## 2012-09-13 MED ORDER — DEXAMETHASONE SODIUM PHOSPHATE 10 MG/ML IJ SOLN
10.0000 mg | Freq: Once | INTRAMUSCULAR | Status: AC
  Administered 2012-09-13: 10 mg via INTRAVENOUS

## 2012-09-13 MED ORDER — SODIUM CHLORIDE 0.9 % IV SOLN
Freq: Once | INTRAVENOUS | Status: AC
  Administered 2012-09-13: 10:00:00 via INTRAVENOUS

## 2012-09-13 MED ORDER — SODIUM CHLORIDE 0.9 % IV SOLN
300.0000 mg/m2 | Freq: Once | INTRAVENOUS | Status: AC
  Administered 2012-09-13: 600 mg via INTRAVENOUS
  Filled 2012-09-13: qty 30

## 2012-09-13 MED ORDER — ONDANSETRON 8 MG/50ML IVPB (CHCC)
8.0000 mg | Freq: Once | INTRAVENOUS | Status: AC
  Administered 2012-09-13: 8 mg via INTRAVENOUS

## 2012-09-13 NOTE — Patient Instructions (Signed)
[facility] Discharge Instructions for Patients Receiving Chemotherapy  Today you received the following chemotherapy agents Cytoxan/Kryprolis To help prevent nausea and vomiting after your treatment, we encourage you to take your nausea medication as directed   If you develop nausea and vomiting that is not controlled by your nausea medication, call the clinic.   BELOW ARE SYMPTOMS THAT SHOULD BE REPORTED IMMEDIATELY:  *FEVER GREATER THAN 100.5 F  *CHILLS WITH OR WITHOUT FEVER  NAUSEA AND VOMITING THAT IS NOT CONTROLLED WITH YOUR NAUSEA MEDICATION  *UNUSUAL SHORTNESS OF BREATH  *UNUSUAL BRUISING OR BLEEDING  TENDERNESS IN MOUTH AND THROAT WITH OR WITHOUT PRESENCE OF ULCERS  *URINARY PROBLEMS  *BOWEL PROBLEMS  UNUSUAL RASH Items with * indicate a potential emergency and should be followed up as soon as possible.  Feel free to call the clinic you have any questions or concerns. The clinic phone number is [phone].

## 2012-09-14 ENCOUNTER — Ambulatory Visit (HOSPITAL_BASED_OUTPATIENT_CLINIC_OR_DEPARTMENT_OTHER): Payer: Medicare Other

## 2012-09-14 ENCOUNTER — Encounter: Payer: Self-pay | Admitting: Specialist

## 2012-09-14 VITALS — BP 147/71 | HR 79 | Temp 97.2°F | Resp 19

## 2012-09-14 DIAGNOSIS — Z5112 Encounter for antineoplastic immunotherapy: Secondary | ICD-10-CM

## 2012-09-14 DIAGNOSIS — C9000 Multiple myeloma not having achieved remission: Secondary | ICD-10-CM

## 2012-09-14 MED ORDER — DEXAMETHASONE SODIUM PHOSPHATE 10 MG/ML IJ SOLN
10.0000 mg | Freq: Once | INTRAMUSCULAR | Status: AC
  Administered 2012-09-14: 10 mg via INTRAVENOUS

## 2012-09-14 MED ORDER — ONDANSETRON 8 MG/50ML IVPB (CHCC)
8.0000 mg | Freq: Once | INTRAVENOUS | Status: AC
  Administered 2012-09-14: 8 mg via INTRAVENOUS

## 2012-09-14 MED ORDER — SODIUM CHLORIDE 0.9 % IV SOLN: Freq: Once | INTRAVENOUS | Status: DC

## 2012-09-14 MED ORDER — SODIUM CHLORIDE 0.9 % IV SOLN
Freq: Once | INTRAVENOUS | Status: AC
  Administered 2012-09-14: 14:00:00 via INTRAVENOUS

## 2012-09-14 MED ORDER — DEXTROSE 5 % IV SOLN
20.0000 mg/m2 | Freq: Once | INTRAVENOUS | Status: AC
  Administered 2012-09-14: 40 mg via INTRAVENOUS
  Filled 2012-09-14: qty 20

## 2012-09-14 NOTE — Patient Instructions (Addendum)
[facility] Discharge Instructions for Patients Receiving Chemotherapy  Today you received the following chemotherapy agent Kyprolis.  To help prevent nausea and vomiting after your treatment, we encourage you to take your nausea medication.   If you develop nausea and vomiting that is not controlled by your nausea medication, call the clinic.   BELOW ARE SYMPTOMS THAT SHOULD BE REPORTED IMMEDIATELY:  *FEVER GREATER THAN 100.5 F  *CHILLS WITH OR WITHOUT FEVER  NAUSEA AND VOMITING THAT IS NOT CONTROLLED WITH YOUR NAUSEA MEDICATION  *UNUSUAL SHORTNESS OF BREATH  *UNUSUAL BRUISING OR BLEEDING  TENDERNESS IN MOUTH AND THROAT WITH OR WITHOUT PRESENCE OF ULCERS  *URINARY PROBLEMS  *BOWEL PROBLEMS  UNUSUAL RASH Items with * indicate a potential emergency and should be followed up as soon as possible.  Feel free to call the clinic you have any questions or concerns. The clinic phone number is [phone].    

## 2012-09-14 NOTE — Progress Notes (Signed)
Spoke briefly with [name] this morning in the [facility] lobby.  He was here for treatment.  I encouraged him to consider participating in a new program offering here, Finding Your New Normal: Living with Cancer, which focuses on the concerns of individuals with relatively stable but incurable disease.  The program will begin in October of this year.

## 2012-09-21 ENCOUNTER — Other Ambulatory Visit: Payer: Medicare Other | Admitting: Lab

## 2012-09-21 ENCOUNTER — Ambulatory Visit: Payer: Medicare Other | Admitting: Physician Assistant

## 2012-09-22 ENCOUNTER — Ambulatory Visit (HOSPITAL_BASED_OUTPATIENT_CLINIC_OR_DEPARTMENT_OTHER): Payer: Medicare Other | Admitting: Physician Assistant

## 2012-09-22 ENCOUNTER — Other Ambulatory Visit (HOSPITAL_BASED_OUTPATIENT_CLINIC_OR_DEPARTMENT_OTHER): Payer: Medicare Other | Admitting: Lab

## 2012-09-22 ENCOUNTER — Ambulatory Visit (HOSPITAL_BASED_OUTPATIENT_CLINIC_OR_DEPARTMENT_OTHER): Payer: Medicare Other

## 2012-09-22 VITALS — BP 141/77 | HR 98 | Temp 98.0°F | Resp 18 | Ht 73.0 in | Wt 169.8 lb

## 2012-09-22 DIAGNOSIS — C9000 Multiple myeloma not having achieved remission: Secondary | ICD-10-CM

## 2012-09-22 DIAGNOSIS — Z5112 Encounter for antineoplastic immunotherapy: Secondary | ICD-10-CM

## 2012-09-22 DIAGNOSIS — Z5111 Encounter for antineoplastic chemotherapy: Secondary | ICD-10-CM

## 2012-09-22 LAB — COMPREHENSIVE METABOLIC PANEL (CC13)
ALT: 11 U/L (ref 0–55)
AST: 13 U/L (ref 5–34)
Albumin: 3.8 g/dL (ref 3.5–5.0)
CO2: 22 mEq/L (ref 22–29)
Calcium: 9.8 mg/dL (ref 8.4–10.4)
Chloride: 102 mEq/L (ref 98–109)
Potassium: 4.8 mEq/L (ref 3.5–5.1)
Total Protein: 7.2 g/dL (ref 6.4–8.3)

## 2012-09-22 LAB — CBC WITH DIFFERENTIAL/PLATELET
BASO%: 0.3 % (ref 0.0–2.0)
Basophils Absolute: 0 10*3/uL (ref 0.0–0.1)
EOS%: 0 % (ref 0.0–7.0)
HCT: 28.7 % — ABNORMAL LOW (ref 38.4–49.9)
HGB: 9.3 g/dL — ABNORMAL LOW (ref 13.0–17.1)
MCH: 25.1 pg — ABNORMAL LOW (ref 27.2–33.4)
MCHC: 32.3 g/dL (ref 32.0–36.0)
MONO#: 0.1 10*3/uL (ref 0.1–0.9)
RDW: 18.9 % — ABNORMAL HIGH (ref 11.0–14.6)
WBC: 6.8 10*3/uL (ref 4.0–10.3)
lymph#: 0.2 10*3/uL — ABNORMAL LOW (ref 0.9–3.3)

## 2012-09-22 MED ORDER — SODIUM CHLORIDE 0.9 % IV SOLN
300.0000 mg/m2 | Freq: Once | INTRAVENOUS | Status: AC
  Administered 2012-09-22: 600 mg via INTRAVENOUS
  Filled 2012-09-22: qty 30

## 2012-09-22 MED ORDER — DEXTROSE 5 % IV SOLN
20.0000 mg/m2 | Freq: Once | INTRAVENOUS | Status: AC
  Administered 2012-09-22: 40 mg via INTRAVENOUS
  Filled 2012-09-22: qty 20

## 2012-09-22 MED ORDER — ONDANSETRON 8 MG/50ML IVPB (CHCC)
8.0000 mg | Freq: Once | INTRAVENOUS | Status: AC
  Administered 2012-09-22: 8 mg via INTRAVENOUS

## 2012-09-22 MED ORDER — DEXAMETHASONE SODIUM PHOSPHATE 10 MG/ML IJ SOLN
10.0000 mg | Freq: Once | INTRAMUSCULAR | Status: AC
  Administered 2012-09-22: 10 mg via INTRAVENOUS

## 2012-09-22 MED ORDER — SODIUM CHLORIDE 0.9 % IV SOLN
Freq: Once | INTRAVENOUS | Status: AC
  Administered 2012-09-22: 14:00:00 via INTRAVENOUS

## 2012-09-22 NOTE — Patient Instructions (Addendum)
[facility] Discharge Instructions for Patients Receiving Chemotherapy  Today you received the following chemotherapy agents Cytoxan and Kyprolis.  To help prevent nausea and vomiting after your treatment, we encourage you to take your nausea medication as prescribed.   If you develop nausea and vomiting that is not controlled by your nausea medication, call the clinic.   BELOW ARE SYMPTOMS THAT SHOULD BE REPORTED IMMEDIATELY:  *FEVER GREATER THAN 100.5 F  *CHILLS WITH OR WITHOUT FEVER  NAUSEA AND VOMITING THAT IS NOT CONTROLLED WITH YOUR NAUSEA MEDICATION  *UNUSUAL SHORTNESS OF BREATH  *UNUSUAL BRUISING OR BLEEDING  TENDERNESS IN MOUTH AND THROAT WITH OR WITHOUT PRESENCE OF ULCERS  *URINARY PROBLEMS  *BOWEL PROBLEMS  UNUSUAL RASH Items with * indicate a potential emergency and should be followed up as soon as possible.  Feel free to call the clinic you have any questions or concerns. The clinic phone number is [phone].    

## 2012-09-22 NOTE — Progress Notes (Addendum)
[facility] Telephone:([phone]) [phone]   [phone]  SHARED VISIT PROGRESS NOTE  Eddie Thomas [address] 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   PRIOR THERAPY: :  1. Status post 6 cycles of systemic chemotherapy with Revlimid and Decadron, last dose was given 07/21/2010 with very good response. 2. 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 Lance Bosch.  3. maintenance Revlimid at 10 mg by mouth daily status post 2 months. Therapy began 01/18/2011. 4. maintenance Revlimid at 15 mg by mouth daily with prophylactic dose Coumadin at 2 mg by mouth daily. 5. Systemic chemotherapy with Velcade at 1.3 mg per meter squared given on days 1, 4, 8 and 11 and Doxil at 30 mg per meter square given on day 4 and Decadron 40 mg by mouth on weekly basis given every 3 weeks. Status post 4 cycles. 6. Zometa 4 mg IV every 4 weeks.  7. Velcade 1.3 mg/M2 subcutaneous daily on a weekly basis with Decadron 20 mg by mouth on a weekly basis. First cycle expected on 06/21/2012. S/P 4 cycles.  CURRENT THERAPY:  1) Systemic chemotherapy with Carfilzomib, cyclophosphamide and Decadron, status post 1 cycle. First cycle started on 08/09/2012. He is status post days 1,2, 8 and 9 of cycle 2  INTERVAL HISTORY: Eddie Thomas 67 y.o. male returns to the clinic today for followup visit. The patient is feeling fine today with no specific complaints except for difficulty staying asleep. This is been an ongoing issue. He also reports some occasional numbness in his toes. He is able to walk 2 miles without any difficulty and has had no problems with near falls or falls. He reports that he is to be followed up at [facility] for his yearly visit on 09/24/2012. They're likely to check protein studies at this visit and he will ensure that we get a copy of all lab work that  is done at this visit as well as the office note. He is currently receiving Zometa every 2 months, last given 09/06/2012. He tolerated the last cycle of his treatment with Carfilzomib, cyclophosphamide and Decadron fairly well. He denied having any significant fever or chills. He denied having any chest pain, shortness breath, cough or hemoptysis. He has no nausea or vomiting. The patient denied having any significant peripheral neuropathy. He is here today to continue cycle #2 of his chemotherapy.   MEDICAL HISTORY: Past Medical History  Diagnosis Date  . Multiple myeloma   . Diabetes mellitus 07/03/2011  . Hypertension 07/03/2011  . Hyperlipidemia 07/03/2011  . Colon polyps 2012  . Gastric ulcer     ALLERGIES:  has No Known Allergies.  MEDICATIONS:  Current Outpatient Prescriptions  Medication Sig Dispense Refill  . aspirin 81 MG tablet Take 81 mg by mouth daily.      [name] dexamethasone (DECADRON) 4 MG tablet 20 Milligram by mouth every week start with the first dose of chemotherapy  80 tablet  1  . ezetimibe (ZETIA) 10 MG tablet Take 10 mg by mouth daily.        . haemophilus B polysaccharide conjugate vaccine (ACTHIB) injection Inject 0.5 mLs into the muscle once.  1 each  0  . lisinopril (PRINIVIL,ZESTRIL) 10 MG tablet Take 5 mg by mouth daily.       . pantoprazole (PROTONIX) 40 MG tablet Take 1 tablet (40  mg total) by mouth 2 (two) times daily.  60 tablet  11  . pioglitazone (ACTOS) 15 MG tablet Take 15 mg by mouth daily.      . prochlorperazine (COMPAZINE) 10 MG tablet Take 1 tablet (10 mg total) by mouth every 6 (six) hours as needed.  30 tablet  0  . simvastatin (ZOCOR) 10 MG tablet Take 10 mg by mouth at bedtime.        . valACYclovir (VALTREX) 500 MG tablet Take 1 tablet (500 mg total) by mouth daily.  30 tablet  3   No current facility-administered medications for this visit.    SURGICAL HISTORY:  Past Surgical History  Procedure Laterality Date  . Limbal stem cell transplant     . Humerus fracture surgery      right    REVIEW OF SYSTEMS:  A comprehensive review of systems was negative except for: Constitutional: positive for fatigue   PHYSICAL EXAMINATION: General appearance: alert, cooperative and no distress Head: Normocephalic, without obvious abnormality, atraumatic Neck: no adenopathy Lymph nodes: Cervical, supraclavicular, and axillary nodes normal. Resp: clear to auscultation bilaterally Cardio: regular rate and rhythm, S1, S2 normal, no murmur, click, rub or gallop GI: soft, non-tender; bowel sounds normal; no masses,  no organomegaly Extremities: extremities normal, atraumatic, no cyanosis or edema  ECOG PERFORMANCE STATUS: 1 - Symptomatic but completely ambulatory  Blood pressure 141/77, pulse 98, temperature 98 F (36.7 C), temperature source Oral, resp. rate 18, height 6\' 1"  (1.854 m), weight 169 lb 12.8 oz (77.021 kg).  LABORATORY DATA: Lab Results  Component Value Date   WBC 6.8 09/22/2012   HGB 9.3* 09/22/2012   HCT 28.7* 09/22/2012   MCV 77.5* 09/22/2012   PLT 158 09/22/2012      Chemistry      Component Value Date/Time   NA 138 09/22/2012 1148   NA 137 02/04/2012 2331   K 4.8 09/22/2012 1148   K 3.8 02/04/2012 2331   CL 103 07/12/2012 0907   CL 91* 02/04/2012 2331   CO2 22 09/22/2012 1148   CO2 34* 02/04/2012 2331   BUN 14.1 09/22/2012 1148   BUN 28* 02/04/2012 2331   CREATININE 1.1 09/22/2012 1148   CREATININE 1.10 02/04/2012 2331      Component Value Date/Time   CALCIUM 9.8 09/22/2012 1148   CALCIUM 9.5 02/04/2012 2331   ALKPHOS 64 09/22/2012 1148   ALKPHOS 71 02/04/2012 2331   AST 13 09/22/2012 1148   AST 18 02/04/2012 2331   ALT 11 09/22/2012 1148   ALT 16 02/04/2012 2331   BILITOT 0.98 09/22/2012 1148   BILITOT 1.3* 02/04/2012 2331       RADIOGRAPHIC STUDIES: No results found.  ASSESSMENT AND PLAN: This is a very pleasant 67 years old [place] American male with multiple myeloma currently undergoing systemic chemotherapy with Carfilzomib,  cyclophosphamide and Decadron status post 1 cycle, as well as two thirds of cycle #2. The patient related the first cycle fairly well with no significant adverse effects. The patient was discussed with him also seen by Dr. Arbutus Ped. He will proceed to days #15 and 16 of cycle #2 as scheduled. He'll return in 2 weeks at the start of cycle #3. Patient may try Benadryl at bedtime to help with sleep.  Tiana Loft E, PA_C   The patient was advised to call immediately she has any concerning symptoms in the interval. The patient voices understanding of current disease status and treatment options and is in agreement with the  current care plan.  All questions were answered. The patient knows to call the clinic with any problems, questions or concerns. We can certainly see the patient much sooner if necessary.  ADDENDUM: Hematology/Oncology Attending: I have face to face encounter with the patient. I recommended his care plan. The patient has multiple myeloma and currently on treatment with Carfilzomib, cyclophosphamide and Decadron status post 1 cycle and he is currently undergoing cycle #2 and tolerating his treatment fairly well. He is scheduled to see Dr. Vicente Serene at [facility] next week with repeat myeloma panel. I would see him back for followup visit in 2 weeks for evaluation and discussion of his lab results and recommendation regarding further treatment of his condition. He was advised to call immediately if he has any concerning symptoms in the interval. Lajuana Matte., MD  09/23/2012

## 2012-09-23 ENCOUNTER — Encounter: Payer: Self-pay | Admitting: Physician Assistant

## 2012-09-23 ENCOUNTER — Telehealth: Payer: Self-pay | Admitting: *Deleted

## 2012-09-23 ENCOUNTER — Telehealth: Payer: Self-pay | Admitting: Internal Medicine

## 2012-09-23 ENCOUNTER — Ambulatory Visit (HOSPITAL_BASED_OUTPATIENT_CLINIC_OR_DEPARTMENT_OTHER): Payer: Medicare Other

## 2012-09-23 VITALS — BP 141/72 | HR 58 | Temp 97.1°F

## 2012-09-23 DIAGNOSIS — Z5112 Encounter for antineoplastic immunotherapy: Secondary | ICD-10-CM

## 2012-09-23 DIAGNOSIS — C9000 Multiple myeloma not having achieved remission: Secondary | ICD-10-CM

## 2012-09-23 DIAGNOSIS — T8600 Unspecified complication of bone marrow transplant: Secondary | ICD-10-CM | POA: Insufficient documentation

## 2012-09-23 MED ORDER — DEXAMETHASONE SODIUM PHOSPHATE 10 MG/ML IJ SOLN
10.0000 mg | Freq: Once | INTRAMUSCULAR | Status: AC
  Administered 2012-09-23: 10 mg via INTRAVENOUS

## 2012-09-23 MED ORDER — SODIUM CHLORIDE 0.9 % IV SOLN
Freq: Once | INTRAVENOUS | Status: AC
  Administered 2012-09-23: 10:00:00 via INTRAVENOUS

## 2012-09-23 MED ORDER — ONDANSETRON 8 MG/50ML IVPB (CHCC)
8.0000 mg | Freq: Once | INTRAVENOUS | Status: AC
  Administered 2012-09-23: 8 mg via INTRAVENOUS

## 2012-09-23 MED ORDER — DEXTROSE 5 % IV SOLN
20.0000 mg/m2 | Freq: Once | INTRAVENOUS | Status: AC
  Administered 2012-09-23: 40 mg via INTRAVENOUS
  Filled 2012-09-23: qty 20

## 2012-09-23 NOTE — Telephone Encounter (Signed)
s.w. pt and advised that his sched was ready...he wants to pick up at todays visit

## 2012-09-23 NOTE — Patient Instructions (Addendum)
[facility] Discharge Instructions for Patients Receiving Chemotherapy  Today you received the following chemotherapy agents kyprolis.  To help prevent nausea and vomiting after your treatment, we encourage you to take your nausea medication compazine.   If you develop nausea and vomiting that is not controlled by your nausea medication, call the clinic.   BELOW ARE SYMPTOMS THAT SHOULD BE REPORTED IMMEDIATELY:  *FEVER GREATER THAN 100.5 F  *CHILLS WITH OR WITHOUT FEVER  NAUSEA AND VOMITING THAT IS NOT CONTROLLED WITH YOUR NAUSEA MEDICATION  *UNUSUAL SHORTNESS OF BREATH  *UNUSUAL BRUISING OR BLEEDING  TENDERNESS IN MOUTH AND THROAT WITH OR WITHOUT PRESENCE OF ULCERS  *URINARY PROBLEMS  *BOWEL PROBLEMS  UNUSUAL RASH Items with * indicate a potential emergency and should be followed up as soon as possible.  Feel free to call the clinic you have any questions or concerns. The clinic phone number is [phone].    

## 2012-09-23 NOTE — Patient Instructions (Signed)
Continue with labs and chemotherapy as scheduled Followup in 2 weeks prior to the start of your next scheduled cycle of chemotherapy

## 2012-09-23 NOTE — Telephone Encounter (Signed)
Per staff message and POF I have scheduled appts.  JMW  

## 2012-09-27 ENCOUNTER — Other Ambulatory Visit: Payer: Medicare Other

## 2012-09-29 ENCOUNTER — Other Ambulatory Visit (HOSPITAL_BASED_OUTPATIENT_CLINIC_OR_DEPARTMENT_OTHER): Payer: Medicare Other

## 2012-09-29 DIAGNOSIS — C9000 Multiple myeloma not having achieved remission: Secondary | ICD-10-CM

## 2012-09-29 LAB — COMPREHENSIVE METABOLIC PANEL (CC13)
ALT: 10 U/L (ref 0–55)
AST: 10 U/L (ref 5–34)
Albumin: 3.4 g/dL — ABNORMAL LOW (ref 3.5–5.0)
Alkaline Phosphatase: 49 U/L (ref 40–150)
Potassium: 3.5 mEq/L (ref 3.5–5.1)
Sodium: 139 mEq/L (ref 136–145)
Total Protein: 5.9 g/dL — ABNORMAL LOW (ref 6.4–8.3)

## 2012-09-29 LAB — CBC WITH DIFFERENTIAL/PLATELET
Basophils Absolute: 0 10*3/uL (ref 0.0–0.1)
Eosinophils Absolute: 0 10*3/uL (ref 0.0–0.5)
HGB: 8.2 g/dL — ABNORMAL LOW (ref 13.0–17.1)
NEUT#: 4.3 10*3/uL (ref 1.5–6.5)
RDW: 18.5 % — ABNORMAL HIGH (ref 11.0–14.6)
lymph#: 0.5 10*3/uL — ABNORMAL LOW (ref 0.9–3.3)

## 2012-10-06 ENCOUNTER — Ambulatory Visit: Payer: Medicare Other

## 2012-10-07 ENCOUNTER — Ambulatory Visit (HOSPITAL_BASED_OUTPATIENT_CLINIC_OR_DEPARTMENT_OTHER): Payer: Medicare Other

## 2012-10-07 ENCOUNTER — Telehealth: Payer: Self-pay | Admitting: Internal Medicine

## 2012-10-07 ENCOUNTER — Other Ambulatory Visit (HOSPITAL_BASED_OUTPATIENT_CLINIC_OR_DEPARTMENT_OTHER): Payer: Medicare Other | Admitting: Lab

## 2012-10-07 ENCOUNTER — Ambulatory Visit: Payer: Medicare Other

## 2012-10-07 ENCOUNTER — Ambulatory Visit (HOSPITAL_BASED_OUTPATIENT_CLINIC_OR_DEPARTMENT_OTHER): Payer: Medicare Other | Admitting: Physician Assistant

## 2012-10-07 VITALS — BP 150/87 | HR 58 | Temp 97.0°F | Resp 19 | Ht 73.0 in | Wt 171.1 lb

## 2012-10-07 DIAGNOSIS — C9000 Multiple myeloma not having achieved remission: Secondary | ICD-10-CM

## 2012-10-07 DIAGNOSIS — Z5111 Encounter for antineoplastic chemotherapy: Secondary | ICD-10-CM

## 2012-10-07 DIAGNOSIS — Z5112 Encounter for antineoplastic immunotherapy: Secondary | ICD-10-CM

## 2012-10-07 LAB — CBC WITH DIFFERENTIAL/PLATELET
EOS%: 1 % (ref 0.0–7.0)
MCH: 25.1 pg — ABNORMAL LOW (ref 27.2–33.4)
MCV: 77.9 fL — ABNORMAL LOW (ref 79.3–98.0)
MONO%: 11.3 % (ref 0.0–14.0)
RBC: 3.94 10*6/uL — ABNORMAL LOW (ref 4.20–5.82)
RDW: 17 % — ABNORMAL HIGH (ref 11.0–14.6)
nRBC: 0 % (ref 0–0)

## 2012-10-07 LAB — COMPREHENSIVE METABOLIC PANEL (CC13)
ALT: 18 U/L (ref 0–55)
Alkaline Phosphatase: 51 U/L (ref 40–150)
Creatinine: 0.9 mg/dL (ref 0.7–1.3)
Glucose: 160 mg/dl — ABNORMAL HIGH (ref 70–140)
Sodium: 140 mEq/L (ref 136–145)
Total Bilirubin: 1.41 mg/dL — ABNORMAL HIGH (ref 0.20–1.20)
Total Protein: 7.2 g/dL (ref 6.4–8.3)

## 2012-10-07 MED ORDER — ZOLEDRONIC ACID 4 MG/100ML IV SOLN
4.0000 mg | Freq: Once | INTRAVENOUS | Status: AC
  Administered 2012-10-07: 4 mg via INTRAVENOUS
  Filled 2012-10-07: qty 100

## 2012-10-07 MED ORDER — DEXAMETHASONE SODIUM PHOSPHATE 10 MG/ML IJ SOLN
10.0000 mg | Freq: Once | INTRAMUSCULAR | Status: AC
  Administered 2012-10-07: 10 mg via INTRAVENOUS

## 2012-10-07 MED ORDER — SODIUM CHLORIDE 0.9 % IV SOLN
Freq: Once | INTRAVENOUS | Status: AC
  Administered 2012-10-07: 12:00:00 via INTRAVENOUS

## 2012-10-07 MED ORDER — ONDANSETRON 8 MG/50ML IVPB (CHCC)
8.0000 mg | Freq: Once | INTRAVENOUS | Status: AC
  Administered 2012-10-07: 8 mg via INTRAVENOUS

## 2012-10-07 MED ORDER — DEXTROSE 5 % IV SOLN
20.0000 mg/m2 | Freq: Once | INTRAVENOUS | Status: AC
  Administered 2012-10-07: 40 mg via INTRAVENOUS
  Filled 2012-10-07: qty 20

## 2012-10-07 MED ORDER — SODIUM CHLORIDE 0.9 % IV SOLN
300.0000 mg/m2 | Freq: Once | INTRAVENOUS | Status: AC
  Administered 2012-10-07: 600 mg via INTRAVENOUS
  Filled 2012-10-07: qty 30

## 2012-10-07 MED ORDER — ONDANSETRON 8 MG/NS 50 ML IVPB
INTRAVENOUS | Status: AC
  Filled 2012-10-07: qty 8

## 2012-10-07 MED ORDER — SODIUM CHLORIDE 0.9 % IV SOLN
Freq: Once | INTRAVENOUS | Status: AC
  Administered 2012-10-07: 13:00:00 via INTRAVENOUS

## 2012-10-07 MED ORDER — ZOLEDRONIC ACID 4 MG/5ML IV CONC: 4.0000 mg | Freq: Once | INTRAVENOUS | Status: DC

## 2012-10-07 MED ORDER — DEXAMETHASONE SODIUM PHOSPHATE 10 MG/ML IJ SOLN
INTRAMUSCULAR | Status: AC
  Filled 2012-10-07: qty 1

## 2012-10-07 NOTE — Progress Notes (Signed)
Discharged at 1500, ambulatory, alone, in no distress to home.

## 2012-10-07 NOTE — Patient Instructions (Signed)
[facility] Discharge Instructions for Patients Receiving Chemotherapy  Today you received the following chemotherapy agents Cytoxan, Krypolis and Zometa.  To help prevent nausea and vomiting after your treatment, we encourage you to take your nausea medication Other Compazine 10  mg tablets   If you develop nausea and vomiting that is not controlled by your nausea medication, call the clinic.   BELOW ARE SYMPTOMS THAT SHOULD BE REPORTED IMMEDIATELY:  *FEVER GREATER THAN 100.5 F  *CHILLS WITH OR WITHOUT FEVER  NAUSEA AND VOMITING THAT IS NOT CONTROLLED WITH YOUR NAUSEA MEDICATION  *UNUSUAL SHORTNESS OF BREATH  *UNUSUAL BRUISING OR BLEEDING  TENDERNESS IN MOUTH AND THROAT WITH OR WITHOUT PRESENCE OF ULCERS  *URINARY PROBLEMS  *BOWEL PROBLEMS  UNUSUAL RASH Items with * indicate a potential emergency and should be followed up as soon as possible.  Feel free to call the clinic you have any questions or concerns. The clinic phone number is [phone].

## 2012-10-07 NOTE — Telephone Encounter (Signed)
gv pt appt schedule for September thru November.  °

## 2012-10-08 ENCOUNTER — Ambulatory Visit (HOSPITAL_BASED_OUTPATIENT_CLINIC_OR_DEPARTMENT_OTHER): Payer: Medicare Other

## 2012-10-08 ENCOUNTER — Encounter: Payer: Self-pay | Admitting: Physician Assistant

## 2012-10-08 VITALS — BP 149/75 | HR 62 | Temp 98.2°F

## 2012-10-08 DIAGNOSIS — C9000 Multiple myeloma not having achieved remission: Secondary | ICD-10-CM

## 2012-10-08 DIAGNOSIS — Z5112 Encounter for antineoplastic immunotherapy: Secondary | ICD-10-CM

## 2012-10-08 DIAGNOSIS — Z23 Encounter for immunization: Secondary | ICD-10-CM

## 2012-10-08 MED ORDER — DEXAMETHASONE SODIUM PHOSPHATE 10 MG/ML IJ SOLN
INTRAMUSCULAR | Status: AC
  Filled 2012-10-08: qty 1

## 2012-10-08 MED ORDER — ONDANSETRON 8 MG/50ML IVPB (CHCC)
8.0000 mg | Freq: Once | INTRAVENOUS | Status: AC
  Administered 2012-10-08: 8 mg via INTRAVENOUS

## 2012-10-08 MED ORDER — SODIUM CHLORIDE 0.9 % IV SOLN
Freq: Once | INTRAVENOUS | Status: AC
  Administered 2012-10-08: 09:00:00 via INTRAVENOUS

## 2012-10-08 MED ORDER — INFLUENZA VAC SPLIT QUAD 0.5 ML IM SUSP
0.5000 mL | Freq: Once | INTRAMUSCULAR | Status: AC
  Administered 2012-10-08: 0.5 mL via INTRAMUSCULAR
  Filled 2012-10-08: qty 0.5

## 2012-10-08 MED ORDER — DEXAMETHASONE SODIUM PHOSPHATE 10 MG/ML IJ SOLN
10.0000 mg | Freq: Once | INTRAMUSCULAR | Status: AC
  Administered 2012-10-08: 10 mg via INTRAVENOUS

## 2012-10-08 MED ORDER — ONDANSETRON 8 MG/NS 50 ML IVPB
INTRAVENOUS | Status: AC
  Filled 2012-10-08: qty 8

## 2012-10-08 MED ORDER — DEXTROSE 5 % IV SOLN
20.0000 mg/m2 | Freq: Once | INTRAVENOUS | Status: AC
  Administered 2012-10-08: 40 mg via INTRAVENOUS
  Filled 2012-10-08: qty 20

## 2012-10-08 NOTE — Progress Notes (Addendum)
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   PRIOR THERAPY: :  1. Status post 6 cycles of systemic chemotherapy with Revlimid and Decadron, last dose was given 07/21/2010 with very good response. 2. 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 Lance Bosch.  3. maintenance Revlimid at 10 mg by mouth daily status post 2 months. Therapy began 01/18/2011. 4. maintenance Revlimid at 15 mg by mouth daily with prophylactic dose Coumadin at 2 mg by mouth daily. 5. Systemic chemotherapy with Velcade at 1.3 mg per meter squared given on days 1, 4, 8 and 11 and Doxil at 30 mg per meter square given on day 4 and Decadron 40 mg by mouth on weekly basis given every 3 weeks. Status post 4 cycles. 6. Zometa 4 mg IV every 4 weeks.  7. Velcade 1.3 mg/M2 subcutaneous daily on a weekly basis with Decadron 20 mg by mouth on a weekly basis. First cycle expected on 06/21/2012. S/P 4 cycles.  CURRENT THERAPY:  1) Systemic chemotherapy with Carfilzomib, cyclophosphamide and Decadron, status post 1 cycle. First cycle started on 08/09/2012. He is status post 2 cycles  INTERVAL HISTORY: Eddie Thomas 67 y.o. male returns to the clinic today for followup visit. He was recently seen by Dr. Lance Bosch at [facility] for his 2 year followup. He received his two-year MMR and pneumococcal vaccines. Dr. Lance Bosch indicated that the potential up into the donor transplant if his current therapy with Carfilzomib, cyclophosphamide and Decadron was was not working. The patient is feeling fine today with no specific complaints except for some low back pain and soreness. He rates it as a 4-5 on a 0-10 scale. He states that he was feeling quite good and did some yard work and may  have overdone it. He voiced no other specific complaints today.  He tolerated the last cycle of his treatment with Carfilzomib, cyclophosphamide and Decadron fairly well. He denied having any significant fever or chills. He denied having any chest pain, shortness breath, cough or hemoptysis. He has no nausea or vomiting. The patient denied having any significant peripheral neuropathy. He is here today to start cycle #3 of his chemotherapy.   MEDICAL HISTORY: Past Medical History  Diagnosis Date  . Multiple myeloma   . Diabetes mellitus 07/03/2011  . Hypertension 07/03/2011  . Hyperlipidemia 07/03/2011  . Colon polyps 2012  . Gastric ulcer     ALLERGIES:  has No Known Allergies.  MEDICATIONS:  Current Outpatient Prescriptions  Medication Sig Dispense Refill  . ACCU-CHEK AVIVA PLUS test strip       . aspirin 81 MG tablet Take 81 mg by mouth daily.      [name] dexamethasone (DECADRON) 4 MG tablet 20 Milligram by mouth every week start with the first dose of chemotherapy  80 tablet  1  . ezetimibe (ZETIA) 10 MG tablet Take 10 mg by mouth daily.        . haemophilus B polysaccharide conjugate vaccine (ACTHIB) injection Inject 0.5 mLs into the muscle once.  1 each  0  . lisinopril (PRINIVIL,ZESTRIL) 10 MG tablet Take 5 mg by mouth daily.       . pantoprazole (PROTONIX) 40 MG tablet Take 1 tablet (40 mg total) by mouth 2 (  two) times daily.  60 tablet  11  . pioglitazone (ACTOS) 15 MG tablet Take 15 mg by mouth daily.      . prochlorperazine (COMPAZINE) 10 MG tablet Take 1 tablet (10 mg total) by mouth every 6 (six) hours as needed.  30 tablet  0  . simvastatin (ZOCOR) 10 MG tablet Take 10 mg by mouth at bedtime.        . valACYclovir (VALTREX) 500 MG tablet Take 1 tablet (500 mg total) by mouth daily.  30 tablet  3   No current facility-administered medications for this visit.   Facility-Administered Medications Ordered in Other Visits  Medication Dose Route Frequency Provider Last Rate Last Dose   . carfilzomib (KYPROLIS) 40 mg in dextrose 5 % 50 mL chemo infusion  20 mg/m2 (Treatment Plan Actual) Intravenous Once Si Gaul, MD      . dexamethasone (DECADRON) injection 10 mg  10 mg Intravenous Once Si Gaul, MD      . influenza vac split quadrivalent PF (FLUARIX) injection 0.5 mL  0.5 mL Intramuscular Once Si Gaul, MD      . ondansetron (ZOFRAN) IVPB 8 mg  8 mg Intravenous Once Si Gaul, MD        SURGICAL HISTORY:  Past Surgical History  Procedure Laterality Date  . Limbal stem cell transplant    . Humerus fracture surgery      right    REVIEW OF SYSTEMS:  A comprehensive review of systems was negative except for: Constitutional: positive for fatigue   PHYSICAL EXAMINATION: General appearance: alert, cooperative and no distress Head: Normocephalic, without obvious abnormality, atraumatic Neck: no adenopathy Lymph nodes: Cervical, supraclavicular, and axillary nodes normal. Resp: clear to auscultation bilaterally Cardio: regular rate and rhythm, S1, S2 normal, no murmur, click, rub or gallop GI: soft, non-tender; bowel sounds normal; no masses,  no organomegaly Extremities: extremities normal, atraumatic, no cyanosis or edema  ECOG PERFORMANCE STATUS: 1 - Symptomatic but completely ambulatory  Blood pressure 150/87, pulse 58, temperature 97 F (36.1 C), temperature source Oral, resp. rate 19, height 6\' 1"  (1.854 m), weight 171 lb 1.6 oz (77.61 kg).  LABORATORY DATA: Lab Results  Component Value Date   WBC 4.0 10/07/2012   HGB 9.9* 10/07/2012   HCT 30.7* 10/07/2012   MCV 77.9* 10/07/2012   PLT 159 10/07/2012      Chemistry      Component Value Date/Time   NA 140 10/07/2012 0944   NA 137 02/04/2012 2331   K 4.4 10/07/2012 0944   K 3.8 02/04/2012 2331   CL 103 07/12/2012 0907   CL 91* 02/04/2012 2331   CO2 25 10/07/2012 0944   CO2 34* 02/04/2012 2331   BUN 12.3 10/07/2012 0944   BUN 28* 02/04/2012 2331   CREATININE 0.9 10/07/2012 0944    CREATININE 1.10 02/04/2012 2331      Component Value Date/Time   CALCIUM 9.6 10/07/2012 0944   CALCIUM 9.5 02/04/2012 2331   ALKPHOS 51 10/07/2012 0944   ALKPHOS 71 02/04/2012 2331   AST 20 10/07/2012 0944   AST 18 02/04/2012 2331   ALT 18 10/07/2012 0944   ALT 16 02/04/2012 2331   BILITOT 1.41* 10/07/2012 0944   BILITOT 1.3* 02/04/2012 2331       RADIOGRAPHIC STUDIES: No results found.  ASSESSMENT AND PLAN: This is a very pleasant 67 years old [place] American male with multiple myeloma currently undergoing systemic chemotherapy with Carfilzomib, cyclophosphamide and Decadron status post 2 cycles. The patient  is tolerating this treatment fairly well with no significant adverse effects. The patient was discussed with him also seen by Dr. Arbutus Ped. Review of his laboratory studies from [facility] and revealed his IgA had decreased to 651 where previously it was 3910. He will proceed with cycle #3 of systemic chemotherapy with Carfilzomib, cyclophosphamide and Decadron.he'll continue with Zometa given every 2 months. He'll followup in 2 weeks for another symptom management visit.   Tiana Loft E, PA_C   The patient was advised to call immediately she has any concerning symptoms in the interval. The patient voices understanding of current disease status and treatment options and is in agreement with the current care plan.  All questions were answered. The patient knows to call the clinic with any problems, questions or concerns. We can certainly see the patient much sooner if necessary.  ADDENDUM:  Hematology/Oncology Attending:  I have a face to face encounter with the patient during his visit. I recommended his care plan. The patient has recurrent multiple myeloma and he is currently on systemic chemotherapy with Carfilzomib, Cytoxan and Decadron status post 2 cycles. He was seen recently at [facility] for the stem cell transplant follow up visit. Myeloma panel performed during his visit showed  significant improvement in his disease on the current chemotherapy regimen with significant decline on the IgA. I discussed the lab result with the patient. I recommended for him to continue her treatment with the same chemotherapy regimen, Carfilzomib, Cytoxan and Decadron. I will repeat his myeloma panel after cycle #4. The patient will receive flu vaccine today during his chemotherapy. He would come back for follow up visit in 2 weeks. Lajuana Matte., MD 10/10/2012

## 2012-10-08 NOTE — Patient Instructions (Addendum)
Continue with labs and chemotherapy as scheduled Follow up in 2 weeks You will now be getting the Zometa once every 2 months

## 2012-10-08 NOTE — Patient Instructions (Addendum)
[facility] Discharge Instructions for Patients Receiving Chemotherapy  Today you received the following chemotherapy agents: Kyprolis  To help prevent nausea and vomiting after your treatment, we encourage you to take your nausea medication: as directed.   If you develop nausea and vomiting that is not controlled by your nausea medication, call the clinic.   BELOW ARE SYMPTOMS THAT SHOULD BE REPORTED IMMEDIATELY:  *FEVER GREATER THAN 100.5 F  *CHILLS WITH OR WITHOUT FEVER  NAUSEA AND VOMITING THAT IS NOT CONTROLLED WITH YOUR NAUSEA MEDICATION  *UNUSUAL SHORTNESS OF BREATH  *UNUSUAL BRUISING OR BLEEDING  TENDERNESS IN MOUTH AND THROAT WITH OR WITHOUT PRESENCE OF ULCERS  *URINARY PROBLEMS  *BOWEL PROBLEMS  UNUSUAL RASH Items with * indicate a potential emergency and should be followed up as soon as possible.  Feel free to call the clinic you have any questions or concerns. The clinic phone number is [phone].    

## 2012-10-11 ENCOUNTER — Other Ambulatory Visit: Payer: Self-pay | Admitting: Certified Registered Nurse Anesthetist

## 2012-10-13 ENCOUNTER — Other Ambulatory Visit (HOSPITAL_BASED_OUTPATIENT_CLINIC_OR_DEPARTMENT_OTHER): Payer: Medicare Other

## 2012-10-13 ENCOUNTER — Ambulatory Visit (HOSPITAL_BASED_OUTPATIENT_CLINIC_OR_DEPARTMENT_OTHER): Payer: Medicare Other

## 2012-10-13 VITALS — BP 152/77 | HR 60 | Temp 97.4°F | Resp 20 | Ht 73.0 in

## 2012-10-13 DIAGNOSIS — Z5112 Encounter for antineoplastic immunotherapy: Secondary | ICD-10-CM

## 2012-10-13 DIAGNOSIS — C9000 Multiple myeloma not having achieved remission: Secondary | ICD-10-CM

## 2012-10-13 DIAGNOSIS — Z5111 Encounter for antineoplastic chemotherapy: Secondary | ICD-10-CM

## 2012-10-13 LAB — COMPREHENSIVE METABOLIC PANEL (CC13)
AST: 12 U/L (ref 5–34)
Albumin: 3.5 g/dL (ref 3.5–5.0)
Alkaline Phosphatase: 52 U/L (ref 40–150)
BUN: 10.8 mg/dL (ref 7.0–26.0)
Creatinine: 0.9 mg/dL (ref 0.7–1.3)
Potassium: 3.7 mEq/L (ref 3.5–5.1)
Total Bilirubin: 1.25 mg/dL — ABNORMAL HIGH (ref 0.20–1.20)

## 2012-10-13 LAB — CBC WITH DIFFERENTIAL/PLATELET
BASO%: 0 % (ref 0.0–2.0)
Basophils Absolute: 0 10*3/uL (ref 0.0–0.1)
EOS%: 0.4 % (ref 0.0–7.0)
Eosinophils Absolute: 0 10*3/uL (ref 0.0–0.5)
HGB: 10 g/dL — ABNORMAL LOW (ref 13.0–17.1)
LYMPH%: 12.4 % — ABNORMAL LOW (ref 14.0–49.0)
MCV: 78.4 fL — ABNORMAL LOW (ref 79.3–98.0)
MONO#: 0.5 10*3/uL (ref 0.1–0.9)
MONO%: 8.9 % (ref 0.0–14.0)
NEUT#: 4.1 10*3/uL (ref 1.5–6.5)
Platelets: 118 10*3/uL — ABNORMAL LOW (ref 140–400)
RBC: 3.99 10*6/uL — ABNORMAL LOW (ref 4.20–5.82)
RDW: 15.9 % — ABNORMAL HIGH (ref 11.0–14.6)
WBC: 5.2 10*3/uL (ref 4.0–10.3)

## 2012-10-13 MED ORDER — ONDANSETRON 8 MG/NS 50 ML IVPB
INTRAVENOUS | Status: AC
  Filled 2012-10-13: qty 8

## 2012-10-13 MED ORDER — DEXAMETHASONE 4 MG PO TABS
ORAL_TABLET | ORAL | Status: AC
  Filled 2012-10-13: qty 10

## 2012-10-13 MED ORDER — DEXAMETHASONE 4 MG PO TABS
40.0000 mg | ORAL_TABLET | Freq: Once | ORAL | Status: AC
  Administered 2012-10-13: 40 mg via ORAL

## 2012-10-13 MED ORDER — DEXTROSE 5 % IV SOLN
20.0000 mg/m2 | Freq: Once | INTRAVENOUS | Status: AC
  Administered 2012-10-13: 40 mg via INTRAVENOUS
  Filled 2012-10-13: qty 20

## 2012-10-13 MED ORDER — ONDANSETRON 8 MG/50ML IVPB (CHCC)
8.0000 mg | Freq: Once | INTRAVENOUS | Status: AC
  Administered 2012-10-13: 8 mg via INTRAVENOUS

## 2012-10-13 MED ORDER — SODIUM CHLORIDE 0.9 % IV SOLN
Freq: Once | INTRAVENOUS | Status: AC
  Administered 2012-10-13: 15:00:00 via INTRAVENOUS

## 2012-10-13 MED ORDER — SODIUM CHLORIDE 0.9 % IV SOLN
300.0000 mg/m2 | Freq: Once | INTRAVENOUS | Status: AC
  Administered 2012-10-13: 600 mg via INTRAVENOUS
  Filled 2012-10-13: qty 30

## 2012-10-13 MED ORDER — DEXAMETHASONE SODIUM PHOSPHATE 10 MG/ML IJ SOLN
10.0000 mg | Freq: Once | INTRAMUSCULAR | Status: AC
  Administered 2012-10-13: 10 mg via INTRAVENOUS

## 2012-10-13 MED ORDER — DEXAMETHASONE SODIUM PHOSPHATE 10 MG/ML IJ SOLN
INTRAMUSCULAR | Status: AC
  Filled 2012-10-13: qty 1

## 2012-10-13 NOTE — Patient Instructions (Addendum)
[facility] Discharge Instructions for Patients Receiving Chemotherapy  Today you received the following chemotherapy agents: Kyprolis, Cytoxan  To help prevent nausea and vomiting after your treatment, we encourage you to take your nausea medication as prescribed.   If you develop nausea and vomiting that is not controlled by your nausea medication, call the clinic.   BELOW ARE SYMPTOMS THAT SHOULD BE REPORTED IMMEDIATELY:  *FEVER GREATER THAN 100.5 F  *CHILLS WITH OR WITHOUT FEVER  NAUSEA AND VOMITING THAT IS NOT CONTROLLED WITH YOUR NAUSEA MEDICATION  *UNUSUAL SHORTNESS OF BREATH  *UNUSUAL BRUISING OR BLEEDING  TENDERNESS IN MOUTH AND THROAT WITH OR WITHOUT PRESENCE OF ULCERS  *URINARY PROBLEMS  *BOWEL PROBLEMS  UNUSUAL RASH Items with * indicate a potential emergency and should be followed up as soon as possible.  Feel free to call the clinic you have any questions or concerns. The clinic phone number is [phone].    

## 2012-10-14 ENCOUNTER — Ambulatory Visit (HOSPITAL_BASED_OUTPATIENT_CLINIC_OR_DEPARTMENT_OTHER): Payer: Medicare Other

## 2012-10-14 VITALS — BP 133/74 | HR 69 | Temp 98.5°F | Resp 18

## 2012-10-14 DIAGNOSIS — Z5112 Encounter for antineoplastic immunotherapy: Secondary | ICD-10-CM

## 2012-10-14 DIAGNOSIS — C9000 Multiple myeloma not having achieved remission: Secondary | ICD-10-CM

## 2012-10-14 MED ORDER — DEXTROSE 5 % IV SOLN
20.0000 mg/m2 | Freq: Once | INTRAVENOUS | Status: AC
  Administered 2012-10-14: 40 mg via INTRAVENOUS
  Filled 2012-10-14: qty 20

## 2012-10-14 MED ORDER — SODIUM CHLORIDE 0.9 % IV SOLN
Freq: Once | INTRAVENOUS | Status: AC
  Administered 2012-10-14: 15:00:00 via INTRAVENOUS

## 2012-10-14 MED ORDER — ONDANSETRON 8 MG/NS 50 ML IVPB
INTRAVENOUS | Status: AC
  Filled 2012-10-14: qty 8

## 2012-10-14 MED ORDER — DEXAMETHASONE SODIUM PHOSPHATE 10 MG/ML IJ SOLN
INTRAMUSCULAR | Status: AC
  Filled 2012-10-14: qty 1

## 2012-10-14 MED ORDER — DEXAMETHASONE SODIUM PHOSPHATE 10 MG/ML IJ SOLN
10.0000 mg | Freq: Once | INTRAMUSCULAR | Status: AC
  Administered 2012-10-14: 10 mg via INTRAVENOUS

## 2012-10-14 MED ORDER — ONDANSETRON 8 MG/50ML IVPB (CHCC)
8.0000 mg | Freq: Once | INTRAVENOUS | Status: AC
  Administered 2012-10-14: 8 mg via INTRAVENOUS

## 2012-10-14 NOTE — Progress Notes (Signed)
Discharged in no distress.  Considering port-a-cath insertion and will discuss this with providers at next f/u.

## 2012-10-14 NOTE — Patient Instructions (Signed)
Carfilzomib injection What is this medicine? CARFILZOMIB is a chemotherapy drug that works by slowing or stopping cancer cell growth. This medicine is used to treat multiple myeloma. This medicine may be used for other purposes; ask your health care provider or pharmacist if you have questions. What should I tell my health care provider before I take this medicine? They need to know if you have any of these conditions: -heart disease -irregular heartbeat -liver disease -lung or breathing disease -an unusual or allergic reaction to carfilzomib, or other medicines, foods, dyes, or preservatives -pregnant or trying to get pregnant -breast-feeding How should I use this medicine? This medicine is for injection or infusion into a vein. It is given by a health care professional in a hospital or clinic setting. Talk to your pediatrician regarding the use of this medicine in children. Special care may be needed. Overdosage: If you think you've taken too much of this medicine contact a poison control center or emergency room at once. Overdosage: If you think you have taken too much of this medicine contact a poison control center or emergency room at once. NOTE: This medicine is only for you. Do not share this medicine with others. What if I miss a dose? It is important not to miss your dose. Call your doctor or health care professional if you are unable to keep an appointment. What may interact with this medicine? Interactions are not expected. Give your health care provider a list of all the medicines, herbs, non-prescription drugs, or dietary supplements you use. Also tell them if you smoke, drink alcohol, or use illegal drugs. Some items may interact with your medicine. This list may not describe all possible interactions. Give your health care provider a list of all the medicines, herbs, non-prescription drugs, or dietary supplements you use. Also tell them if you smoke, drink alcohol, or use  illegal drugs. Some items may interact with your medicine. What should I watch for while using this medicine? Your condition will be monitored carefully while you are receiving this medicine. Report any side effects. Continue your course of treatment even though you feel ill unless your doctor tells you to stop. Call your doctor or health care professional for advice if you get a fever, chills or sore throat, or other symptoms of a cold or flu. Do not treat yourself. Try to avoid being around people who are sick. Do not become pregnant while taking this medicine. Women should inform their doctor if they wish to become pregnant or think they might be pregnant. There is a potential for serious side effects to an unborn child. Talk to your health care professional or pharmacist for more information. Do not breast-feed an infant while taking this medicine. Check with your doctor or health care professional if you get an attack of severe diarrhea, nausea and vomiting, or if you sweat a lot. The loss of too much body fluid can make it dangerous for you to take this medicine. You may get dizzy. Do not drive, use machinery, or do anything that needs mental alertness until you know how this medicine affects you. Do not stand or sit up quickly, especially if you are an older patient. This reduces the risk of dizzy or fainting spells. What side effects may I notice from receiving this medicine? Side effects that you should report to your doctor or health care professional as soon as possible: -allergic reactions like skin rash, itching or hives, swelling of the face, lips, or tongue -breathing   problems -chest pain or palpitationschest tightness -cough -dark urine -dizziness -feeling faint or lightheaded -fever or chills -general ill feeling or flu-like symptoms -light-colored stools -palpitations -right upper belly pain -swelling of the legs or ankles -unusual bleeding or bruising -unusually weak or  tired -yellowing of the eyes or skin  Side effects that usually do not require medical attention (Report these to your doctor or health care professional if they continue or are bothersome.): -diarrhea -headache -nausea, vomiting -tiredness This list may not describe all possible side effects. Call your doctor for medical advice about side effects. You may report side effects to FDA at 1-800-FDA-1088. Where should I keep my medicine? This drug is given in a hospital or clinic and will not be stored at home. NOTE: This sheet is a summary. It may not cover all possible information. If you have questions about this medicine, talk to your doctor, pharmacist, or health care provider.  2013, [employer]. (12/26/2010 4:16:30 PM)  

## 2012-10-20 ENCOUNTER — Telehealth: Payer: Self-pay | Admitting: Internal Medicine

## 2012-10-20 ENCOUNTER — Ambulatory Visit (HOSPITAL_BASED_OUTPATIENT_CLINIC_OR_DEPARTMENT_OTHER): Payer: Medicare Other | Admitting: Internal Medicine

## 2012-10-20 ENCOUNTER — Other Ambulatory Visit (HOSPITAL_BASED_OUTPATIENT_CLINIC_OR_DEPARTMENT_OTHER): Payer: Medicare Other | Admitting: Lab

## 2012-10-20 ENCOUNTER — Encounter: Payer: Self-pay | Admitting: Internal Medicine

## 2012-10-20 ENCOUNTER — Ambulatory Visit: Payer: Medicare Other

## 2012-10-20 ENCOUNTER — Other Ambulatory Visit: Payer: Medicare Other | Admitting: Lab

## 2012-10-20 ENCOUNTER — Ambulatory Visit (HOSPITAL_BASED_OUTPATIENT_CLINIC_OR_DEPARTMENT_OTHER): Payer: Medicare Other

## 2012-10-20 VITALS — BP 154/76 | HR 71 | Temp 97.9°F | Resp 18 | Ht 73.0 in | Wt 174.1 lb

## 2012-10-20 DIAGNOSIS — Z5111 Encounter for antineoplastic chemotherapy: Secondary | ICD-10-CM

## 2012-10-20 DIAGNOSIS — C9000 Multiple myeloma not having achieved remission: Secondary | ICD-10-CM

## 2012-10-20 DIAGNOSIS — G609 Hereditary and idiopathic neuropathy, unspecified: Secondary | ICD-10-CM

## 2012-10-20 DIAGNOSIS — Z5112 Encounter for antineoplastic immunotherapy: Secondary | ICD-10-CM

## 2012-10-20 DIAGNOSIS — D6481 Anemia due to antineoplastic chemotherapy: Secondary | ICD-10-CM

## 2012-10-20 LAB — CBC WITH DIFFERENTIAL/PLATELET
Eosinophils Absolute: 0 10*3/uL (ref 0.0–0.5)
HCT: 27.8 % — ABNORMAL LOW (ref 38.4–49.9)
LYMPH%: 13.6 % — ABNORMAL LOW (ref 14.0–49.0)
MCV: 78.3 fL — ABNORMAL LOW (ref 79.3–98.0)
MONO#: 0.3 10*3/uL (ref 0.1–0.9)
MONO%: 7.9 % (ref 0.0–14.0)
NEUT#: 3 10*3/uL (ref 1.5–6.5)
NEUT%: 77.7 % — ABNORMAL HIGH (ref 39.0–75.0)
Platelets: 125 10*3/uL — ABNORMAL LOW (ref 140–400)
WBC: 3.8 10*3/uL — ABNORMAL LOW (ref 4.0–10.3)
nRBC: 0 % (ref 0–0)

## 2012-10-20 LAB — COMPREHENSIVE METABOLIC PANEL (CC13)
ALT: 9 U/L (ref 0–55)
BUN: 9.7 mg/dL (ref 7.0–26.0)
CO2: 23 mEq/L (ref 22–29)
Calcium: 8.8 mg/dL (ref 8.4–10.4)
Chloride: 104 mEq/L (ref 98–109)
Creatinine: 0.9 mg/dL (ref 0.7–1.3)
Glucose: 377 mg/dl — ABNORMAL HIGH (ref 70–140)
Potassium: 4.9 mEq/L (ref 3.5–5.1)
Total Bilirubin: 1.12 mg/dL (ref 0.20–1.20)

## 2012-10-20 MED ORDER — ONDANSETRON 8 MG/50ML IVPB (CHCC)
8.0000 mg | Freq: Once | INTRAVENOUS | Status: AC
  Administered 2012-10-20: 8 mg via INTRAVENOUS

## 2012-10-20 MED ORDER — DEXAMETHASONE SODIUM PHOSPHATE 10 MG/ML IJ SOLN
INTRAMUSCULAR | Status: AC
  Filled 2012-10-20: qty 1

## 2012-10-20 MED ORDER — SODIUM CHLORIDE 0.9 % IV SOLN
300.0000 mg/m2 | Freq: Once | INTRAVENOUS | Status: AC
  Administered 2012-10-20: 600 mg via INTRAVENOUS
  Filled 2012-10-20: qty 30

## 2012-10-20 MED ORDER — DEXAMETHASONE SODIUM PHOSPHATE 10 MG/ML IJ SOLN
10.0000 mg | Freq: Once | INTRAMUSCULAR | Status: AC
  Administered 2012-10-20: 10 mg via INTRAVENOUS

## 2012-10-20 MED ORDER — ONDANSETRON 8 MG/NS 50 ML IVPB
INTRAVENOUS | Status: AC
  Filled 2012-10-20: qty 8

## 2012-10-20 MED ORDER — DEXTROSE 5 % IV SOLN
20.0000 mg/m2 | Freq: Once | INTRAVENOUS | Status: AC
  Administered 2012-10-20: 40 mg via INTRAVENOUS
  Filled 2012-10-20: qty 20

## 2012-10-20 MED ORDER — SODIUM CHLORIDE 0.9 % IV SOLN: Freq: Once | INTRAVENOUS | Status: DC

## 2012-10-20 MED ORDER — SODIUM CHLORIDE 0.9 % IV SOLN
Freq: Once | INTRAVENOUS | Status: AC
  Administered 2012-10-20: 12:00:00 via INTRAVENOUS

## 2012-10-20 NOTE — Telephone Encounter (Signed)
gv and printed appt sched and avs for pt for OCT.....emailed MW to adjust tx. °

## 2012-10-20 NOTE — Patient Instructions (Addendum)
[facility] Discharge Instructions for Patients Receiving Chemotherapy  Today you received the following chemotherapy agents Kyprolis and Cytoxan.  To help prevent nausea and vomiting after your treatment, we encourage you to take your nausea medication.   If you develop nausea and vomiting that is not controlled by your nausea medication, call the clinic.   BELOW ARE SYMPTOMS THAT SHOULD BE REPORTED IMMEDIATELY:  *FEVER GREATER THAN 100.5 F  *CHILLS WITH OR WITHOUT FEVER  NAUSEA AND VOMITING THAT IS NOT CONTROLLED WITH YOUR NAUSEA MEDICATION  *UNUSUAL SHORTNESS OF BREATH  *UNUSUAL BRUISING OR BLEEDING  TENDERNESS IN MOUTH AND THROAT WITH OR WITHOUT PRESENCE OF ULCERS  *URINARY PROBLEMS  *BOWEL PROBLEMS  UNUSUAL RASH Items with * indicate a potential emergency and should be followed up as soon as possible.  Feel free to call the clinic you have any questions or concerns. The clinic phone number is [phone].    

## 2012-10-20 NOTE — Patient Instructions (Signed)
Continue chemotherapy today as scheduled.  Follow up visit in 2 weeks. 

## 2012-10-20 NOTE — Progress Notes (Signed)
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   PRIOR THERAPY: :  1. Status post 6 cycles of systemic chemotherapy with Revlimid and Decadron, last dose was given 07/21/2010 with very good response. 2. 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 Lance Bosch.  3. maintenance Revlimid at 10 mg by mouth daily status post 2 months. Therapy began 01/18/2011. 4. maintenance Revlimid at 15 mg by mouth daily with prophylactic dose Coumadin at 2 mg by mouth daily. 5. Systemic chemotherapy with Velcade at 1.3 mg per meter squared given on days 1, 4, 8 and 11 and Doxil at 30 mg per meter square given on day 4 and Decadron 40 mg by mouth on weekly basis given every 3 weeks. Status post 4 cycles. 6. Zometa 4 mg IV every 4 weeks.  7. Velcade 1.3 mg/M2 subcutaneous daily on a weekly basis with Decadron 20 mg by mouth on a weekly basis. First cycle expected on 06/21/2012. S/P 4 cycles.  CURRENT THERAPY:  1) Systemic chemotherapy with Carfilzomib, cyclophosphamide and Decadron, status post 1 cycle. First cycle started on 08/09/2012. He is status post 2 cycles and currently undergoing cycle #3   INTERVAL HISTORY: Eddie Thomas 67 y.o. male returns to the clinic today for followup visit. The patient is feeling fine with no specific complaints. He denied having any significant weight loss or night sweats. He has no fever or chills. He has no nausea or vomiting. The patient denied having any significant chest pain, shortness of breath, cough or hemoptysis. 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  MEDICAL HISTORY: Past Medical History    Diagnosis Date  . Multiple myeloma   . Diabetes mellitus 07/03/2011  . Hypertension 07/03/2011  . Hyperlipidemia 07/03/2011  . Colon polyps 2012  . Gastric ulcer     ALLERGIES:  has No Known Allergies.  MEDICATIONS:  Current Outpatient Prescriptions  Medication Sig Dispense Refill  . ACCU-CHEK AVIVA PLUS test strip       . aspirin 81 MG tablet Take 81 mg by mouth daily.      [name] dexamethasone (DECADRON) 4 MG tablet 20 Milligram by mouth every week start with the first dose of chemotherapy  80 tablet  1  . ezetimibe (ZETIA) 10 MG tablet Take 10 mg by mouth daily.        [name] lisinopril (PRINIVIL,ZESTRIL) 10 MG tablet Take 5 mg by mouth daily.       . pantoprazole (PROTONIX) 40 MG tablet Take 1 tablet (40 mg total) by mouth 2 (two) times daily.  60 tablet  11  . pioglitazone (ACTOS) 15 MG tablet Take 15 mg by mouth daily.      . simvastatin (ZOCOR) 10 MG tablet Take 10 mg by mouth at bedtime.        . valACYclovir (VALTREX) 500 MG tablet Take 1 tablet (500 mg total) by mouth daily.  30 tablet  3  . haemophilus B polysaccharide conjugate vaccine (ACTHIB) injection Inject 0.5 mLs into the muscle once.  1 each  0  . prochlorperazine (COMPAZINE) 10 MG tablet Take 1 tablet (10 mg total) by mouth every 6 (six) hours as needed.  30 tablet  0   No current facility-administered medications for this visit.    SURGICAL HISTORY:  Past Surgical History  Procedure Laterality Date  . Limbal stem cell transplant    . Humerus fracture surgery      right    REVIEW OF SYSTEMS:  A comprehensive review of systems was negative except for: Neurological: positive for paresthesia   PHYSICAL EXAMINATION: General appearance: alert, cooperative and no distress Head: Normocephalic, without obvious abnormality, atraumatic Neck: no adenopathy, no JVD, supple, symmetrical, trachea midline and thyroid not enlarged, symmetric, no tenderness/mass/nodules Lymph nodes: Cervical, supraclavicular, and axillary nodes  normal. Resp: clear to auscultation bilaterally Back: symmetric, no curvature. ROM normal. No CVA tenderness. Cardio: regular rate and rhythm, S1, S2 normal, no murmur, click, rub or gallop GI: soft, non-tender; bowel sounds normal; no masses,  no organomegaly Extremities: extremities normal, atraumatic, no cyanosis or edema  ECOG PERFORMANCE STATUS: 1 - Symptomatic but completely ambulatory  Blood pressure 154/76, pulse 71, temperature 97.9 F (36.6 C), temperature source Oral, resp. rate 18, height 6\' 1"  (1.854 m), weight 174 lb 1.6 oz (78.971 kg), SpO2 100.00%.  LABORATORY DATA: Lab Results  Component Value Date   WBC 3.8* 10/20/2012   HGB 8.9* 10/20/2012   HCT 27.8* 10/20/2012   MCV 78.3* 10/20/2012   PLT 125* 10/20/2012      Chemistry      Component Value Date/Time   NA 139 10/13/2012 1311   NA 137 02/04/2012 2331   K 3.7 10/13/2012 1311   K 3.8 02/04/2012 2331   CL 103 07/12/2012 0907   CL 91* 02/04/2012 2331   CO2 26 10/13/2012 1311   CO2 34* 02/04/2012 2331   BUN 10.8 10/13/2012 1311   BUN 28* 02/04/2012 2331   CREATININE 0.9 10/13/2012 1311   CREATININE 1.10 02/04/2012 2331      Component Value Date/Time   CALCIUM 9.4 10/13/2012 1311   CALCIUM 9.5 02/04/2012 2331   ALKPHOS 52 10/13/2012 1311   ALKPHOS 71 02/04/2012 2331   AST 12 10/13/2012 1311   AST 18 02/04/2012 2331   ALT 10 10/13/2012 1311   ALT 16 02/04/2012 2331   BILITOT 1.25* 10/13/2012 1311   BILITOT 1.3* 02/04/2012 2331       RADIOGRAPHIC STUDIES: No results found.  ASSESSMENT AND PLAN: This is a very pleasant 67 years old [place] American male with history of multiple myeloma currently undergoing systemic chemotherapy with Carfilzomib, cyclophosphamide and Decadron status post 2 cycles and he is currently undergoing cycle #3 and tolerating his treatment fairly well. The patient will have a week off next week. He would come back for followup visit in 2 weeks with the start of cycle #4.  He was advised to call  immediately if he has any concerning symptoms in the interval.  For the chemotherapy-induced anemia, I will continue to monitor the patient closely and consider him for PRBCs transfusion if his hemoglobin less than 8.0 g/dL.  The patient voices understanding of current disease status and treatment options and is in agreement with the current care plan.  All questions were answered. The patient knows to call the clinic with any problems, questions or concerns. We can certainly see the patient much sooner if necessary.

## 2012-10-21 ENCOUNTER — Other Ambulatory Visit: Payer: Self-pay | Admitting: *Deleted

## 2012-10-21 ENCOUNTER — Ambulatory Visit (HOSPITAL_BASED_OUTPATIENT_CLINIC_OR_DEPARTMENT_OTHER): Payer: Medicare Other

## 2012-10-21 VITALS — BP 140/75 | HR 81 | Temp 97.3°F

## 2012-10-21 DIAGNOSIS — C9002 Multiple myeloma in relapse: Secondary | ICD-10-CM

## 2012-10-21 DIAGNOSIS — C9000 Multiple myeloma not having achieved remission: Secondary | ICD-10-CM

## 2012-10-21 DIAGNOSIS — Z5112 Encounter for antineoplastic immunotherapy: Secondary | ICD-10-CM

## 2012-10-21 MED ORDER — SODIUM CHLORIDE 0.9 % IV SOLN
Freq: Once | INTRAVENOUS | Status: AC
  Administered 2012-10-21: 13:00:00 via INTRAVENOUS

## 2012-10-21 MED ORDER — DEXAMETHASONE SODIUM PHOSPHATE 10 MG/ML IJ SOLN
INTRAMUSCULAR | Status: AC
  Filled 2012-10-21: qty 1

## 2012-10-21 MED ORDER — CARFILZOMIB CHEMO INJECTION 60 MG
20.0000 mg/m2 | Freq: Once | INTRAVENOUS | Status: AC
  Administered 2012-10-21: 40 mg via INTRAVENOUS
  Filled 2012-10-21: qty 20

## 2012-10-21 MED ORDER — SODIUM CHLORIDE 0.9 % IV SOLN
Freq: Once | INTRAVENOUS | Status: AC
  Administered 2012-10-21: 14:00:00 via INTRAVENOUS

## 2012-10-21 MED ORDER — ONDANSETRON 8 MG/NS 50 ML IVPB
INTRAVENOUS | Status: AC
  Filled 2012-10-21: qty 8

## 2012-10-21 MED ORDER — ONDANSETRON 8 MG/50ML IVPB (CHCC)
8.0000 mg | Freq: Once | INTRAVENOUS | Status: AC
  Administered 2012-10-21: 8 mg via INTRAVENOUS

## 2012-10-21 MED ORDER — DEXAMETHASONE SODIUM PHOSPHATE 10 MG/ML IJ SOLN
10.0000 mg | Freq: Once | INTRAMUSCULAR | Status: AC
  Administered 2012-10-21: 10 mg via INTRAVENOUS

## 2012-10-21 NOTE — Patient Instructions (Addendum)
[facility] Discharge Instructions for Patients Receiving Chemotherapy  Today you received the following chemotherapy agents Kyprolis To help prevent nausea and vomiting after your treatment, we encourage you to take your nausea medication as prescribed.  If you develop nausea and vomiting that is not controlled by your nausea medication, call the clinic.   BELOW ARE SYMPTOMS THAT SHOULD BE REPORTED IMMEDIATELY:  *FEVER GREATER THAN 100.5 F  *CHILLS WITH OR WITHOUT FEVER  NAUSEA AND VOMITING THAT IS NOT CONTROLLED WITH YOUR NAUSEA MEDICATION  *UNUSUAL SHORTNESS OF BREATH  *UNUSUAL BRUISING OR BLEEDING  TENDERNESS IN MOUTH AND THROAT WITH OR WITHOUT PRESENCE OF ULCERS  *URINARY PROBLEMS  *BOWEL PROBLEMS  UNUSUAL RASH Items with * indicate a potential emergency and should be followed up as soon as possible.  Feel free to call the clinic you have any questions or concerns. The clinic phone number is [phone].    

## 2012-10-27 ENCOUNTER — Other Ambulatory Visit (HOSPITAL_BASED_OUTPATIENT_CLINIC_OR_DEPARTMENT_OTHER): Payer: Medicare Other

## 2012-10-27 DIAGNOSIS — C9000 Multiple myeloma not having achieved remission: Secondary | ICD-10-CM

## 2012-10-27 LAB — COMPREHENSIVE METABOLIC PANEL (CC13)
Albumin: 3.5 g/dL (ref 3.5–5.0)
Alkaline Phosphatase: 47 U/L (ref 40–150)
Anion Gap: 8 mEq/L (ref 3–11)
BUN: 11.9 mg/dL (ref 7.0–26.0)
Creatinine: 1 mg/dL (ref 0.7–1.3)
Glucose: 280 mg/dl — ABNORMAL HIGH (ref 70–140)
Potassium: 4.3 mEq/L (ref 3.5–5.1)
Total Bilirubin: 1.25 mg/dL — ABNORMAL HIGH (ref 0.20–1.20)

## 2012-10-27 LAB — CBC WITH DIFFERENTIAL/PLATELET
Basophils Absolute: 0 10*3/uL (ref 0.0–0.1)
Eosinophils Absolute: 0 10*3/uL (ref 0.0–0.5)
HCT: 28.5 % — ABNORMAL LOW (ref 38.4–49.9)
HGB: 9.1 g/dL — ABNORMAL LOW (ref 13.0–17.1)
LYMPH%: 7.1 % — ABNORMAL LOW (ref 14.0–49.0)
MCHC: 31.9 g/dL — ABNORMAL LOW (ref 32.0–36.0)
MCV: 79.9 fL (ref 79.3–98.0)
MONO%: 1.9 % (ref 0.0–14.0)
NEUT#: 3.8 10*3/uL (ref 1.5–6.5)
Platelets: 150 10*3/uL (ref 140–400)

## 2012-11-03 ENCOUNTER — Ambulatory Visit (HOSPITAL_BASED_OUTPATIENT_CLINIC_OR_DEPARTMENT_OTHER): Payer: Medicare Other

## 2012-11-03 ENCOUNTER — Other Ambulatory Visit (HOSPITAL_BASED_OUTPATIENT_CLINIC_OR_DEPARTMENT_OTHER): Payer: Medicare Other | Admitting: Lab

## 2012-11-03 ENCOUNTER — Encounter: Payer: Self-pay | Admitting: Internal Medicine

## 2012-11-03 ENCOUNTER — Telehealth: Payer: Self-pay | Admitting: Internal Medicine

## 2012-11-03 ENCOUNTER — Other Ambulatory Visit: Payer: Medicare Other | Admitting: Lab

## 2012-11-03 ENCOUNTER — Encounter: Payer: Self-pay | Admitting: Physician Assistant

## 2012-11-03 ENCOUNTER — Ambulatory Visit (HOSPITAL_BASED_OUTPATIENT_CLINIC_OR_DEPARTMENT_OTHER): Payer: Medicare Other | Admitting: Physician Assistant

## 2012-11-03 VITALS — BP 133/77 | HR 62 | Temp 97.9°F | Resp 20 | Ht 73.0 in | Wt 171.3 lb

## 2012-11-03 DIAGNOSIS — E119 Type 2 diabetes mellitus without complications: Secondary | ICD-10-CM

## 2012-11-03 DIAGNOSIS — I10 Essential (primary) hypertension: Secondary | ICD-10-CM

## 2012-11-03 DIAGNOSIS — C9002 Multiple myeloma in relapse: Secondary | ICD-10-CM

## 2012-11-03 DIAGNOSIS — C9000 Multiple myeloma not having achieved remission: Secondary | ICD-10-CM

## 2012-11-03 DIAGNOSIS — Z5112 Encounter for antineoplastic immunotherapy: Secondary | ICD-10-CM

## 2012-11-03 LAB — COMPREHENSIVE METABOLIC PANEL (CC13)
ALT: 10 U/L (ref 0–55)
AST: 13 U/L (ref 5–34)
Albumin: 3.7 g/dL (ref 3.5–5.0)
Anion Gap: 8 mEq/L (ref 3–11)
BUN: 11.4 mg/dL (ref 7.0–26.0)
CO2: 26 mEq/L (ref 22–29)
Calcium: 9.5 mg/dL (ref 8.4–10.4)
Chloride: 103 mEq/L (ref 98–109)
Potassium: 4.3 mEq/L (ref 3.5–5.1)
Total Bilirubin: 1.42 mg/dL — ABNORMAL HIGH (ref 0.20–1.20)

## 2012-11-03 LAB — CBC WITH DIFFERENTIAL/PLATELET
BASO%: 0 % (ref 0.0–2.0)
Eosinophils Absolute: 0 10*3/uL (ref 0.0–0.5)
HGB: 9.7 g/dL — ABNORMAL LOW (ref 13.0–17.1)
LYMPH%: 23 % (ref 14.0–49.0)
MCV: 78.5 fL — ABNORMAL LOW (ref 79.3–98.0)
MONO#: 0.3 10*3/uL (ref 0.1–0.9)
MONO%: 8.8 % (ref 0.0–14.0)
NEUT#: 2.2 10*3/uL (ref 1.5–6.5)
NEUT%: 67.3 % (ref 39.0–75.0)
Platelets: 129 10*3/uL — ABNORMAL LOW (ref 140–400)
RBC: 3.9 10*6/uL — ABNORMAL LOW (ref 4.20–5.82)
WBC: 3.3 10*3/uL — ABNORMAL LOW (ref 4.0–10.3)
lymph#: 0.8 10*3/uL — ABNORMAL LOW (ref 0.9–3.3)
nRBC: 0 % (ref 0–0)

## 2012-11-03 MED ORDER — SODIUM CHLORIDE 0.9 % IV SOLN
Freq: Once | INTRAVENOUS | Status: AC
  Administered 2012-11-03: 14:00:00 via INTRAVENOUS

## 2012-11-03 MED ORDER — ONDANSETRON 8 MG/50ML IVPB (CHCC)
8.0000 mg | Freq: Once | INTRAVENOUS | Status: AC
  Administered 2012-11-03: 8 mg via INTRAVENOUS

## 2012-11-03 MED ORDER — TEMAZEPAM 15 MG PO CAPS: 15.0000 mg | ORAL_CAPSULE | Freq: Every evening | ORAL | Status: DC | PRN

## 2012-11-03 MED ORDER — VALACYCLOVIR HCL 500 MG PO TABS: 500.0000 mg | ORAL_TABLET | Freq: Every day | ORAL | Status: DC

## 2012-11-03 MED ORDER — CYCLOPHOSPHAMIDE CHEMO INJECTION 1 GM
300.0000 mg/m2 | Freq: Once | INTRAMUSCULAR | Status: AC
  Administered 2012-11-03: 600 mg via INTRAVENOUS
  Filled 2012-11-03: qty 30

## 2012-11-03 MED ORDER — DEXTROSE 5 % IV SOLN
20.0000 mg/m2 | Freq: Once | INTRAVENOUS | Status: AC
  Administered 2012-11-03: 40 mg via INTRAVENOUS
  Filled 2012-11-03: qty 20

## 2012-11-03 MED ORDER — DEXAMETHASONE SODIUM PHOSPHATE 10 MG/ML IJ SOLN
INTRAMUSCULAR | Status: AC
  Filled 2012-11-03: qty 1

## 2012-11-03 MED ORDER — DEXAMETHASONE SODIUM PHOSPHATE 10 MG/ML IJ SOLN
10.0000 mg | Freq: Once | INTRAMUSCULAR | Status: AC
  Administered 2012-11-03: 10 mg via INTRAVENOUS

## 2012-11-03 MED ORDER — ONDANSETRON 8 MG/NS 50 ML IVPB
INTRAVENOUS | Status: AC
  Filled 2012-11-03: qty 8

## 2012-11-03 MED ORDER — SODIUM CHLORIDE 0.9 % IV SOLN
Freq: Once | INTRAVENOUS | Status: AC
  Administered 2012-11-03: 13:00:00 via INTRAVENOUS

## 2012-11-03 NOTE — Telephone Encounter (Signed)
gv and printed appt sched and avs for pt for OCT. °

## 2012-11-03 NOTE — Patient Instructions (Signed)
[facility] Discharge Instructions for Patients Receiving Chemotherapy  Today you received the following chemotherapy agents kyprolis, cytoxan  To help prevent nausea and vomiting after your treatment, we encourage you to take your nausea medication as needed   If you develop nausea and vomiting that is not controlled by your nausea medication, call the clinic.   BELOW ARE SYMPTOMS THAT SHOULD BE REPORTED IMMEDIATELY:  *FEVER GREATER THAN 100.5 F  *CHILLS WITH OR WITHOUT FEVER  NAUSEA AND VOMITING THAT IS NOT CONTROLLED WITH YOUR NAUSEA MEDICATION  *UNUSUAL SHORTNESS OF BREATH  *UNUSUAL BRUISING OR BLEEDING  TENDERNESS IN MOUTH AND THROAT WITH OR WITHOUT PRESENCE OF ULCERS  *URINARY PROBLEMS  *BOWEL PROBLEMS  UNUSUAL RASH Items with * indicate a potential emergency and should be followed up as soon as possible.  Feel free to call the clinic you have any questions or concerns. The clinic phone number is [phone].    

## 2012-11-04 ENCOUNTER — Ambulatory Visit (HOSPITAL_BASED_OUTPATIENT_CLINIC_OR_DEPARTMENT_OTHER): Payer: Medicare Other

## 2012-11-04 VITALS — BP 149/80 | HR 91 | Temp 98.1°F | Resp 16

## 2012-11-04 DIAGNOSIS — C9000 Multiple myeloma not having achieved remission: Secondary | ICD-10-CM

## 2012-11-04 DIAGNOSIS — Z5112 Encounter for antineoplastic immunotherapy: Secondary | ICD-10-CM

## 2012-11-04 MED ORDER — ONDANSETRON 8 MG/50ML IVPB (CHCC)
8.0000 mg | Freq: Once | INTRAVENOUS | Status: AC
  Administered 2012-11-04: 8 mg via INTRAVENOUS

## 2012-11-04 MED ORDER — DEXAMETHASONE SODIUM PHOSPHATE 10 MG/ML IJ SOLN
10.0000 mg | Freq: Once | INTRAMUSCULAR | Status: AC
  Administered 2012-11-04: 10 mg via INTRAVENOUS

## 2012-11-04 MED ORDER — ONDANSETRON 8 MG/NS 50 ML IVPB
INTRAVENOUS | Status: AC
  Filled 2012-11-04: qty 8

## 2012-11-04 MED ORDER — SODIUM CHLORIDE 0.9 % IV SOLN: Freq: Once | INTRAVENOUS | Status: DC

## 2012-11-04 MED ORDER — DEXAMETHASONE SODIUM PHOSPHATE 10 MG/ML IJ SOLN
INTRAMUSCULAR | Status: AC
  Filled 2012-11-04: qty 1

## 2012-11-04 MED ORDER — DEXTROSE 5 % IV SOLN
20.0000 mg/m2 | Freq: Once | INTRAVENOUS | Status: AC
  Administered 2012-11-04: 40 mg via INTRAVENOUS
  Filled 2012-11-04: qty 20

## 2012-11-04 MED ORDER — SODIUM CHLORIDE 0.9 % IV SOLN
Freq: Once | INTRAVENOUS | Status: AC
  Administered 2012-11-04: 14:00:00 via INTRAVENOUS

## 2012-11-04 NOTE — Progress Notes (Addendum)
[facility] [phone]   [phone]  OFFICE PROGRESS NOTE  [name] [address] 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   PRIOR THERAPY: :  1. Status post 6 cycles of systemic chemotherapy with Revlimid and Decadron, last dose was given 07/21/2010 with very good response. 2. 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 Lance Bosch.  3. maintenance Revlimid at 10 mg by mouth daily status post 2 months. Therapy began 01/18/2011. 4. maintenance Revlimid at 15 mg by mouth daily with prophylactic dose Coumadin at 2 mg by mouth daily. 5. Systemic chemotherapy with Velcade at 1.3 mg per meter squared given on days 1, 4, 8 and 11 and Doxil at 30 mg per meter square given on day 4 and Decadron 40 mg by mouth on weekly basis given every 3 weeks. Status post 4 cycles. 6. Zometa 4 mg IV every 4 weeks.  7. Velcade 1.3 mg/M2 subcutaneous daily on a weekly basis with Decadron 20 mg by mouth on a weekly basis. First cycle expected on 06/21/2012. S/P 4 cycles.  CURRENT THERAPY:  1) Systemic chemotherapy with Carfilzomib, cyclophosphamide and Decadron ( 20 mg by mouth once weekly), status post 1 cycle. First cycle started on 08/09/2012. He is status post 3 cycles  2)  Zometa 4 mg Iv given every 2 months   INTERVAL HISTORY: Eddie Thomas 67 y.o. male returns to the clinic today for followup visit. Overall is tolerating his chemotherapy relatively well with the exception of difficulty sleeping. He is obtaining no relief with over-the-counter products including Tylenol PM and Benadryl. He notes some mild tingling in his feet but overall states that even the symptoms have improved. He'll be due for repeat protein studies after completion of cycle 4 of his chemotherapy. He denied having any significant weight loss or night sweats. He  has no fever or chills. He has no nausea or vomiting. The patient denied having any significant chest pain, shortness of breath, cough or hemoptysis. 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 As stated above this has improved slightly.  MEDICAL HISTORY: Past Medical History  Diagnosis Date  . Multiple myeloma   . Diabetes mellitus 07/03/2011  . Hypertension 07/03/2011  . Hyperlipidemia 07/03/2011  . Colon polyps 2012  . Gastric ulcer     ALLERGIES:  has No Known Allergies.  MEDICATIONS:  Current Outpatient Prescriptions  Medication Sig Dispense Refill  . ACCU-CHEK AVIVA PLUS test strip       . aspirin 81 MG tablet Take 81 mg by mouth daily.      [name] dexamethasone (DECADRON) 4 MG tablet 20 Milligram by mouth every week start with the first dose of chemotherapy  80 tablet  1  . ezetimibe (ZETIA) 10 MG tablet Take 10 mg by mouth daily.        . haemophilus B polysaccharide conjugate vaccine (ACTHIB) injection Inject 0.5 mLs into the muscle once.  1 each  0  . lisinopril (PRINIVIL,ZESTRIL) 10 MG tablet Take 5 mg by mouth daily.       . pantoprazole (PROTONIX) 40 MG tablet Take 1 tablet (40 mg total) by mouth 2 (two) times daily.  60 tablet  11  . pioglitazone (ACTOS) 15 MG tablet Take 15 mg by mouth daily.      [name]  prochlorperazine (COMPAZINE) 10 MG tablet Take 1 tablet (10 mg total) by mouth every 6 (six) hours as needed.  30 tablet  0  . simvastatin (ZOCOR) 10 MG tablet Take 10 mg by mouth at bedtime.        . temazepam (RESTORIL) 15 MG capsule Take 1 capsule (15 mg total) by mouth at bedtime as needed for sleep.  30 capsule  0  . valACYclovir (VALTREX) 500 MG tablet Take 1 tablet (500 mg total) by mouth daily.  30 tablet  3   No current facility-administered medications for this visit.   Facility-Administered Medications Ordered in Other Visits  Medication Dose Route Frequency  Provider Last Rate Last Dose  . 0.9 %  sodium chloride infusion   Intravenous Once Conni Slipper, PA-C        SURGICAL HISTORY:  Past Surgical History  Procedure Laterality Date  . Limbal stem cell transplant    . Humerus fracture surgery      right    REVIEW OF SYSTEMS:  A comprehensive review of systems was negative except for: Constitutional: positive for Insomnia Neurological: positive for paresthesia   PHYSICAL EXAMINATION: General appearance: alert, cooperative and no distress Head: Normocephalic, without obvious abnormality, atraumatic Neck: no adenopathy, no JVD, supple, symmetrical, trachea midline and thyroid not enlarged, symmetric, no tenderness/mass/nodules Lymph nodes: Cervical, supraclavicular, and axillary nodes normal. Resp: clear to auscultation bilaterally Back: symmetric, no curvature. ROM normal. No CVA tenderness. Cardio: regular rate and rhythm, S1, S2 normal, no murmur, click, rub or gallop GI: soft, non-tender; bowel sounds normal; no masses,  no organomegaly Extremities: extremities normal, atraumatic, no cyanosis or edema  ECOG PERFORMANCE STATUS: 1 - Symptomatic but completely ambulatory  Blood pressure 133/77, pulse 62, temperature 97.9 F (36.6 C), temperature source Oral, resp. rate 20, height 6\' 1"  (1.854 m), weight 171 lb 4.8 oz (77.701 kg).  LABORATORY DATA: Lab Results  Component Value Date   WBC 3.3* 11/03/2012   HGB 9.7* 11/03/2012   HCT 30.6* 11/03/2012   MCV 78.5* 11/03/2012   PLT 129* 11/03/2012      Chemistry      Component Value Date/Time   NA 137 11/03/2012 1059   NA 137 02/04/2012 2331   K 4.3 11/03/2012 1059   K 3.8 02/04/2012 2331   CL 103 07/12/2012 0907   CL 91* 02/04/2012 2331   CO2 26 11/03/2012 1059   CO2 34* 02/04/2012 2331   BUN 11.4 11/03/2012 1059   BUN 28* 02/04/2012 2331   CREATININE 0.9 11/03/2012 1059   CREATININE 1.10 02/04/2012 2331      Component Value Date/Time   CALCIUM 9.5 11/03/2012 1059   CALCIUM  9.5 02/04/2012 2331   ALKPHOS 47 11/03/2012 1059   ALKPHOS 71 02/04/2012 2331   AST 13 11/03/2012 1059   AST 18 02/04/2012 2331   ALT 10 11/03/2012 1059   ALT 16 02/04/2012 2331   BILITOT 1.42* 11/03/2012 1059   BILITOT 1.3* 02/04/2012 2331       RADIOGRAPHIC STUDIES: No results found.  ASSESSMENT AND PLAN: This is a very pleasant 67 years old [place] American male with history of multiple myeloma currently undergoing systemic chemotherapy with Carfilzomib, cyclophosphamide and Decadron status post 3 cycles. Patient was discussed with also seen by Dr. Arbutus Ped. He will proceed with cycle #4 of his chemotherapy as scheduled today. For his insomnia we will start him on Restoril 15 mg by mouth at bedtime assay for insomnia total of 30 with no refill.  We'll plan for him to proceed with cycle #4 and will plan to order protein studies on November 5 with him the following up with Dr. Arbutus Ped on November 12 2 discuss the results of the repeat protein studies. He will continue to receive Zometa every 2 months last given in September 2014. A  Yael Coppess E, PA-C  He was advised to call immediately if he has any concerning symptoms in the interval.  For the chemotherapy-induced anemia, I will continue to monitor the patient closely and consider him for PRBCs transfusion if his hemoglobin less than 8.0 g/dL.  The patient voices understanding of current disease status and treatment options and is in agreement with the current care plan.  All questions were answered. The patient knows to call the clinic with any problems, questions or concerns. We can certainly see the patient much sooner if necessary.  ADDENDUM: Hematology/Oncology Attending: I had a face to face encounter with the patient. I recommended his care plan. The patient is currently undergoing systemic chemotherapy with Carfilzomib, Cytoxan and Decadron and tolerating it fairly well. We will complete cycle #4 as scheduled and the patient  would come back for follow up visit in 2 weeks for reevaluation and management any adverse effect of his treatment. She will continue on Zometa injection every 8 weeks. The patient was advised to call immediately if he has any concerning symptoms in the interval. Lajuana Matte., MD 11/06/2012

## 2012-11-04 NOTE — Patient Instructions (Signed)
[facility] Discharge Instructions for Patients Receiving Chemotherapy  Today you received the following chemotherapy agents Kyprolis To help prevent nausea and vomiting after your treatment, we encourage you to take your nausea medication as prescribed.  If you develop nausea and vomiting that is not controlled by your nausea medication, call the clinic.   BELOW ARE SYMPTOMS THAT SHOULD BE REPORTED IMMEDIATELY:  *FEVER GREATER THAN 100.5 F  *CHILLS WITH OR WITHOUT FEVER  NAUSEA AND VOMITING THAT IS NOT CONTROLLED WITH YOUR NAUSEA MEDICATION  *UNUSUAL SHORTNESS OF BREATH  *UNUSUAL BRUISING OR BLEEDING  TENDERNESS IN MOUTH AND THROAT WITH OR WITHOUT PRESENCE OF ULCERS  *URINARY PROBLEMS  *BOWEL PROBLEMS  UNUSUAL RASH Items with * indicate a potential emergency and should be followed up as soon as possible.  Feel free to call the clinic you have any questions or concerns. The clinic phone number is [phone].    

## 2012-11-04 NOTE — Patient Instructions (Signed)
Continue with lab and chemotherapy appointment as scheduled Take Restoril 15 mg by mouth at bedtime as needed for insomnia Followup in 2 weeks for another symptom management visit

## 2012-11-10 ENCOUNTER — Ambulatory Visit (HOSPITAL_BASED_OUTPATIENT_CLINIC_OR_DEPARTMENT_OTHER): Payer: Medicare Other

## 2012-11-10 ENCOUNTER — Other Ambulatory Visit (HOSPITAL_BASED_OUTPATIENT_CLINIC_OR_DEPARTMENT_OTHER): Payer: Medicare Other | Admitting: Lab

## 2012-11-10 VITALS — BP 132/78 | HR 65 | Temp 97.4°F | Resp 19 | Ht 73.0 in

## 2012-11-10 DIAGNOSIS — C9002 Multiple myeloma in relapse: Secondary | ICD-10-CM

## 2012-11-10 DIAGNOSIS — Z5112 Encounter for antineoplastic immunotherapy: Secondary | ICD-10-CM

## 2012-11-10 DIAGNOSIS — C9000 Multiple myeloma not having achieved remission: Secondary | ICD-10-CM

## 2012-11-10 LAB — CBC WITH DIFFERENTIAL/PLATELET
Basophils Absolute: 0 10*3/uL (ref 0.0–0.1)
EOS%: 0 % (ref 0.0–7.0)
Eosinophils Absolute: 0 10*3/uL (ref 0.0–0.5)
HCT: 30.4 % — ABNORMAL LOW (ref 38.4–49.9)
HGB: 9.9 g/dL — ABNORMAL LOW (ref 13.0–17.1)
MCH: 25.5 pg — ABNORMAL LOW (ref 27.2–33.4)
MONO#: 0.2 10*3/uL (ref 0.1–0.9)
MONO%: 2.5 % (ref 0.0–14.0)
NEUT#: 6.3 10*3/uL (ref 1.5–6.5)
NEUT%: 91.5 % — ABNORMAL HIGH (ref 39.0–75.0)
Platelets: 126 10*3/uL — ABNORMAL LOW (ref 140–400)
RDW: 14.6 % (ref 11.0–14.6)
WBC: 6.8 10*3/uL (ref 4.0–10.3)
lymph#: 0.4 10*3/uL — ABNORMAL LOW (ref 0.9–3.3)

## 2012-11-10 LAB — COMPREHENSIVE METABOLIC PANEL (CC13)
ALT: 13 U/L (ref 0–55)
Albumin: 3.5 g/dL (ref 3.5–5.0)
Alkaline Phosphatase: 47 U/L (ref 40–150)
Anion Gap: 9 mEq/L (ref 3–11)
CO2: 19 mEq/L — ABNORMAL LOW (ref 22–29)
Calcium: 9.4 mg/dL (ref 8.4–10.4)
Chloride: 107 mEq/L (ref 98–109)
Creatinine: 0.9 mg/dL (ref 0.7–1.3)
Potassium: 5.5 mEq/L — ABNORMAL HIGH (ref 3.5–5.1)
Total Protein: 6.8 g/dL (ref 6.4–8.3)

## 2012-11-10 MED ORDER — DEXAMETHASONE SODIUM PHOSPHATE 10 MG/ML IJ SOLN
10.0000 mg | Freq: Once | INTRAMUSCULAR | Status: AC
  Administered 2012-11-10: 10 mg via INTRAVENOUS

## 2012-11-10 MED ORDER — SODIUM CHLORIDE 0.9 % IV SOLN
300.0000 mg/m2 | Freq: Once | INTRAVENOUS | Status: AC
  Administered 2012-11-10: 600 mg via INTRAVENOUS
  Filled 2012-11-10: qty 30

## 2012-11-10 MED ORDER — SODIUM CHLORIDE 0.9 % IV SOLN
Freq: Once | INTRAVENOUS | Status: AC
  Administered 2012-11-10: 14:00:00 via INTRAVENOUS

## 2012-11-10 MED ORDER — DEXTROSE 5 % IV SOLN
20.0000 mg/m2 | Freq: Once | INTRAVENOUS | Status: AC
  Administered 2012-11-10: 40 mg via INTRAVENOUS
  Filled 2012-11-10: qty 20

## 2012-11-10 MED ORDER — ONDANSETRON 8 MG/NS 50 ML IVPB
INTRAVENOUS | Status: AC
  Filled 2012-11-10: qty 8

## 2012-11-10 MED ORDER — DEXAMETHASONE SODIUM PHOSPHATE 10 MG/ML IJ SOLN
INTRAMUSCULAR | Status: AC
  Filled 2012-11-10: qty 1

## 2012-11-10 MED ORDER — ONDANSETRON 8 MG/50ML IVPB (CHCC)
8.0000 mg | Freq: Once | INTRAVENOUS | Status: AC
  Administered 2012-11-10: 8 mg via INTRAVENOUS

## 2012-11-10 MED ORDER — SODIUM CHLORIDE 0.9 % IV SOLN
Freq: Once | INTRAVENOUS | Status: AC
  Administered 2012-11-10: 16:00:00 via INTRAVENOUS

## 2012-11-10 NOTE — Patient Instructions (Signed)
[facility] Discharge Instructions for Patients Receiving Chemotherapy  Today you received the following chemotherapy agents Cytoxan and Kyprolis.  To help prevent nausea and vomiting after your treatment, we encourage you to take your nausea medication.   If you develop nausea and vomiting that is not controlled by your nausea medication, call the clinic.   BELOW ARE SYMPTOMS THAT SHOULD BE REPORTED IMMEDIATELY:  *FEVER GREATER THAN 100.5 F  *CHILLS WITH OR WITHOUT FEVER  NAUSEA AND VOMITING THAT IS NOT CONTROLLED WITH YOUR NAUSEA MEDICATION  *UNUSUAL SHORTNESS OF BREATH  *UNUSUAL BRUISING OR BLEEDING  TENDERNESS IN MOUTH AND THROAT WITH OR WITHOUT PRESENCE OF ULCERS  *URINARY PROBLEMS  *BOWEL PROBLEMS  UNUSUAL RASH Items with * indicate a potential emergency and should be followed up as soon as possible.  Feel free to call the clinic you have any questions or concerns. The clinic phone number is [phone].    

## 2012-11-10 NOTE — Progress Notes (Signed)
Quick Note:  Call patient with the result and repeat B-Met in few days. ______

## 2012-11-11 ENCOUNTER — Telehealth: Payer: Self-pay | Admitting: *Deleted

## 2012-11-11 ENCOUNTER — Ambulatory Visit (HOSPITAL_BASED_OUTPATIENT_CLINIC_OR_DEPARTMENT_OTHER): Payer: Medicare Other | Admitting: Lab

## 2012-11-11 ENCOUNTER — Ambulatory Visit (HOSPITAL_BASED_OUTPATIENT_CLINIC_OR_DEPARTMENT_OTHER): Payer: Medicare Other

## 2012-11-11 VITALS — BP 137/74 | HR 72 | Temp 97.2°F | Resp 20

## 2012-11-11 DIAGNOSIS — C9002 Multiple myeloma in relapse: Secondary | ICD-10-CM

## 2012-11-11 DIAGNOSIS — C9000 Multiple myeloma not having achieved remission: Secondary | ICD-10-CM

## 2012-11-11 DIAGNOSIS — E875 Hyperkalemia: Secondary | ICD-10-CM

## 2012-11-11 DIAGNOSIS — Z5112 Encounter for antineoplastic immunotherapy: Secondary | ICD-10-CM

## 2012-11-11 LAB — COMPREHENSIVE METABOLIC PANEL (CC13)
ALT: 11 U/L (ref 0–55)
AST: 14 U/L (ref 5–34)
Alkaline Phosphatase: 52 U/L (ref 40–150)
Anion Gap: 13 mEq/L — ABNORMAL HIGH (ref 3–11)
Calcium: 9.6 mg/dL (ref 8.4–10.4)
Chloride: 106 mEq/L (ref 98–109)
Creatinine: 1.1 mg/dL (ref 0.7–1.3)
Total Bilirubin: 1.64 mg/dL — ABNORMAL HIGH (ref 0.20–1.20)
Total Protein: 6.8 g/dL (ref 6.4–8.3)

## 2012-11-11 LAB — CBC WITH DIFFERENTIAL/PLATELET
BASO%: 0.1 % (ref 0.0–2.0)
EOS%: 0 % (ref 0.0–7.0)
Eosinophils Absolute: 0 10*3/uL (ref 0.0–0.5)
HGB: 9.7 g/dL — ABNORMAL LOW (ref 13.0–17.1)
LYMPH%: 6.5 % — ABNORMAL LOW (ref 14.0–49.0)
MCH: 25.3 pg — ABNORMAL LOW (ref 27.2–33.4)
MCHC: 32.4 g/dL (ref 32.0–36.0)
MCV: 78.1 fL — ABNORMAL LOW (ref 79.3–98.0)
MONO%: 8.3 % (ref 0.0–14.0)
NEUT#: 9.6 10*3/uL — ABNORMAL HIGH (ref 1.5–6.5)
Platelets: 115 10*3/uL — ABNORMAL LOW (ref 140–400)
RBC: 3.83 10*6/uL — ABNORMAL LOW (ref 4.20–5.82)
RDW: 14.7 % — ABNORMAL HIGH (ref 11.0–14.6)
lymph#: 0.7 10*3/uL — ABNORMAL LOW (ref 0.9–3.3)
nRBC: 0 % (ref 0–0)

## 2012-11-11 MED ORDER — DEXAMETHASONE SODIUM PHOSPHATE 10 MG/ML IJ SOLN
INTRAMUSCULAR | Status: AC
  Filled 2012-11-11: qty 1

## 2012-11-11 MED ORDER — ONDANSETRON 8 MG/NS 50 ML IVPB
INTRAVENOUS | Status: AC
  Filled 2012-11-11: qty 8

## 2012-11-11 MED ORDER — DEXTROSE 5 % IV SOLN
20.0000 mg/m2 | Freq: Once | INTRAVENOUS | Status: AC
  Administered 2012-11-11: 40 mg via INTRAVENOUS
  Filled 2012-11-11: qty 20

## 2012-11-11 MED ORDER — ONDANSETRON 8 MG/50ML IVPB (CHCC)
8.0000 mg | Freq: Once | INTRAVENOUS | Status: AC
  Administered 2012-11-11: 8 mg via INTRAVENOUS

## 2012-11-11 MED ORDER — DEXAMETHASONE SODIUM PHOSPHATE 10 MG/ML IJ SOLN
10.0000 mg | Freq: Once | INTRAMUSCULAR | Status: AC
  Administered 2012-11-11: 10 mg via INTRAVENOUS

## 2012-11-11 MED ORDER — SODIUM CHLORIDE 0.9 % IV SOLN
Freq: Once | INTRAVENOUS | Status: AC
  Administered 2012-11-11: 14:00:00 via INTRAVENOUS

## 2012-11-11 NOTE — Telephone Encounter (Signed)
Message copied by Caren Griffins on Thu Nov 11, 2012  9:07 AM ------      Message from: Si Gaul      Created: Wed Nov 10, 2012  5:40 PM       Call patient with the result and repeat B-Met in few days. ------

## 2012-11-11 NOTE — Telephone Encounter (Signed)
Pt verbalized understanding regarding lab appt today.  SLJ

## 2012-11-11 NOTE — Patient Instructions (Signed)
Carfilzomib injection What is this medicine? CARFILZOMIB is a chemotherapy drug that works by slowing or stopping cancer cell growth. This medicine is used to treat multiple myeloma. This medicine may be used for other purposes; ask your health care provider or pharmacist if you have questions. What should I tell my health care provider before I take this medicine? They need to know if you have any of these conditions: -heart disease -irregular heartbeat -liver disease -lung or breathing disease -an unusual or allergic reaction to carfilzomib, or other medicines, foods, dyes, or preservatives -pregnant or trying to get pregnant -breast-feeding How should I use this medicine? This medicine is for injection or infusion into a vein. It is given by a health care professional in a hospital or clinic setting. Talk to your pediatrician regarding the use of this medicine in children. Special care may be needed. Overdosage: If you think you've taken too much of this medicine contact a poison control center or emergency room at once. Overdosage: If you think you have taken too much of this medicine contact a poison control center or emergency room at once. NOTE: This medicine is only for you. Do not share this medicine with others. What if I miss a dose? It is important not to miss your dose. Call your doctor or health care professional if you are unable to keep an appointment. What may interact with this medicine? Interactions are not expected. Give your health care provider a list of all the medicines, herbs, non-prescription drugs, or dietary supplements you use. Also tell them if you smoke, drink alcohol, or use illegal drugs. Some items may interact with your medicine. This list may not describe all possible interactions. Give your health care provider a list of all the medicines, herbs, non-prescription drugs, or dietary supplements you use. Also tell them if you smoke, drink alcohol, or use  illegal drugs. Some items may interact with your medicine. What should I watch for while using this medicine? Your condition will be monitored carefully while you are receiving this medicine. Report any side effects. Continue your course of treatment even though you feel ill unless your doctor tells you to stop. Call your doctor or health care professional for advice if you get a fever, chills or sore throat, or other symptoms of a cold or flu. Do not treat yourself. Try to avoid being around people who are sick. Do not become pregnant while taking this medicine. Women should inform their doctor if they wish to become pregnant or think they might be pregnant. There is a potential for serious side effects to an unborn child. Talk to your health care professional or pharmacist for more information. Do not breast-feed an infant while taking this medicine. Check with your doctor or health care professional if you get an attack of severe diarrhea, nausea and vomiting, or if you sweat a lot. The loss of too much body fluid can make it dangerous for you to take this medicine. You may get dizzy. Do not drive, use machinery, or do anything that needs mental alertness until you know how this medicine affects you. Do not stand or sit up quickly, especially if you are an older patient. This reduces the risk of dizzy or fainting spells. What side effects may I notice from receiving this medicine? Side effects that you should report to your doctor or health care professional as soon as possible: -allergic reactions like skin rash, itching or hives, swelling of the face, lips, or tongue -breathing   problems -chest pain or palpitationschest tightness -cough -dark urine -dizziness -feeling faint or lightheaded -fever or chills -general ill feeling or flu-like symptoms -light-colored stools -palpitations -right upper belly pain -swelling of the legs or ankles -unusual bleeding or bruising -unusually weak or  tired -yellowing of the eyes or skin  Side effects that usually do not require medical attention (Report these to your doctor or health care professional if they continue or are bothersome.): -diarrhea -headache -nausea, vomiting -tiredness This list may not describe all possible side effects. Call your doctor for medical advice about side effects. You may report side effects to FDA at 1-800-FDA-1088. Where should I keep my medicine? This drug is given in a hospital or clinic and will not be stored at home. NOTE: This sheet is a summary. It may not cover all possible information. If you have questions about this medicine, talk to your doctor, pharmacist, or health care provider.  2013, [employer]. (12/26/2010 4:16:30 PM)  

## 2012-11-16 ENCOUNTER — Telehealth: Payer: Self-pay | Admitting: *Deleted

## 2012-11-16 ENCOUNTER — Telehealth: Payer: Self-pay | Admitting: Internal Medicine

## 2012-11-16 ENCOUNTER — Other Ambulatory Visit (HOSPITAL_BASED_OUTPATIENT_CLINIC_OR_DEPARTMENT_OTHER): Payer: Medicare Other

## 2012-11-16 ENCOUNTER — Ambulatory Visit (HOSPITAL_BASED_OUTPATIENT_CLINIC_OR_DEPARTMENT_OTHER): Payer: Medicare Other | Admitting: Physician Assistant

## 2012-11-16 VITALS — BP 139/69 | HR 99 | Temp 97.6°F | Resp 18 | Ht 73.0 in | Wt 174.8 lb

## 2012-11-16 DIAGNOSIS — C9000 Multiple myeloma not having achieved remission: Secondary | ICD-10-CM

## 2012-11-16 DIAGNOSIS — G609 Hereditary and idiopathic neuropathy, unspecified: Secondary | ICD-10-CM

## 2012-11-16 DIAGNOSIS — G4700 Insomnia, unspecified: Secondary | ICD-10-CM

## 2012-11-16 DIAGNOSIS — D6481 Anemia due to antineoplastic chemotherapy: Secondary | ICD-10-CM

## 2012-11-16 DIAGNOSIS — C9002 Multiple myeloma in relapse: Secondary | ICD-10-CM

## 2012-11-16 LAB — CBC WITH DIFFERENTIAL/PLATELET
BASO%: 0.2 % (ref 0.0–2.0)
Basophils Absolute: 0 10*3/uL (ref 0.0–0.1)
EOS%: 1.2 % (ref 0.0–7.0)
HCT: 28.4 % — ABNORMAL LOW (ref 38.4–49.9)
HGB: 9.2 g/dL — ABNORMAL LOW (ref 13.0–17.1)
LYMPH%: 16.4 % (ref 14.0–49.0)
MCH: 25.5 pg — ABNORMAL LOW (ref 27.2–33.4)
MCV: 78.5 fL — ABNORMAL LOW (ref 79.3–98.0)
MONO%: 10.1 % (ref 0.0–14.0)
NEUT%: 72.1 % (ref 39.0–75.0)
Platelets: 116 10*3/uL — ABNORMAL LOW (ref 140–400)
RDW: 14.3 % (ref 11.0–14.6)
lymph#: 0.6 10*3/uL — ABNORMAL LOW (ref 0.9–3.3)

## 2012-11-16 LAB — COMPREHENSIVE METABOLIC PANEL (CC13)
Albumin: 3.1 g/dL — ABNORMAL LOW (ref 3.5–5.0)
Anion Gap: 8 mEq/L (ref 3–11)
CO2: 26 mEq/L (ref 22–29)
Creatinine: 0.9 mg/dL (ref 0.7–1.3)
Glucose: 136 mg/dl (ref 70–140)
Total Bilirubin: 1.31 mg/dL — ABNORMAL HIGH (ref 0.20–1.20)

## 2012-11-16 NOTE — Telephone Encounter (Signed)
Per staff message and POF I have scheduled appts.  JMW  

## 2012-11-16 NOTE — Telephone Encounter (Signed)
Gave pt appt for labs, md and chemo for November 2014

## 2012-11-17 ENCOUNTER — Ambulatory Visit (HOSPITAL_BASED_OUTPATIENT_CLINIC_OR_DEPARTMENT_OTHER): Payer: Medicare Other

## 2012-11-17 ENCOUNTER — Other Ambulatory Visit: Payer: Medicare Other | Admitting: Lab

## 2012-11-17 VITALS — BP 147/76 | HR 61 | Temp 98.3°F | Resp 20

## 2012-11-17 DIAGNOSIS — C9002 Multiple myeloma in relapse: Secondary | ICD-10-CM

## 2012-11-17 DIAGNOSIS — C9000 Multiple myeloma not having achieved remission: Secondary | ICD-10-CM

## 2012-11-17 DIAGNOSIS — Z5111 Encounter for antineoplastic chemotherapy: Secondary | ICD-10-CM

## 2012-11-17 DIAGNOSIS — Z5112 Encounter for antineoplastic immunotherapy: Secondary | ICD-10-CM

## 2012-11-17 MED ORDER — ONDANSETRON 8 MG/NS 50 ML IVPB
INTRAVENOUS | Status: AC
  Filled 2012-11-17: qty 8

## 2012-11-17 MED ORDER — DEXAMETHASONE SODIUM PHOSPHATE 10 MG/ML IJ SOLN
INTRAMUSCULAR | Status: AC
  Filled 2012-11-17: qty 1

## 2012-11-17 MED ORDER — SODIUM CHLORIDE 0.9 % IV SOLN
Freq: Once | INTRAVENOUS | Status: AC
  Administered 2012-11-17: 15:00:00 via INTRAVENOUS

## 2012-11-17 MED ORDER — SODIUM CHLORIDE 0.9 % IV SOLN
300.0000 mg/m2 | Freq: Once | INTRAVENOUS | Status: AC
  Administered 2012-11-17: 600 mg via INTRAVENOUS
  Filled 2012-11-17: qty 30

## 2012-11-17 MED ORDER — DEXAMETHASONE SODIUM PHOSPHATE 10 MG/ML IJ SOLN
10.0000 mg | Freq: Once | INTRAMUSCULAR | Status: AC
  Administered 2012-11-17: 10 mg via INTRAVENOUS

## 2012-11-17 MED ORDER — ONDANSETRON 8 MG/50ML IVPB (CHCC)
8.0000 mg | Freq: Once | INTRAVENOUS | Status: AC
  Administered 2012-11-17: 8 mg via INTRAVENOUS

## 2012-11-17 MED ORDER — DEXTROSE 5 % IV SOLN
20.0000 mg/m2 | Freq: Once | INTRAVENOUS | Status: AC
  Administered 2012-11-17: 40 mg via INTRAVENOUS
  Filled 2012-11-17: qty 20

## 2012-11-17 NOTE — Patient Instructions (Signed)
[facility] Discharge Instructions for Patients Receiving Chemotherapy  Today you received the following chemotherapy agents: Cytoxan, Kyprolis.  To help prevent nausea and vomiting after your treatment, we encourage you to take your nausea medication as prescribed.   If you develop nausea and vomiting that is not controlled by your nausea medication, call the clinic.   BELOW ARE SYMPTOMS THAT SHOULD BE REPORTED IMMEDIATELY:  *FEVER GREATER THAN 100.5 F  *CHILLS WITH OR WITHOUT FEVER  NAUSEA AND VOMITING THAT IS NOT CONTROLLED WITH YOUR NAUSEA MEDICATION  *UNUSUAL SHORTNESS OF BREATH  *UNUSUAL BRUISING OR BLEEDING  TENDERNESS IN MOUTH AND THROAT WITH OR WITHOUT PRESENCE OF ULCERS  *URINARY PROBLEMS  *BOWEL PROBLEMS  UNUSUAL RASH Items with * indicate a potential emergency and should be followed up as soon as possible.  Feel free to call the clinic you have any questions or concerns. The clinic phone number is [phone].    

## 2012-11-17 NOTE — Progress Notes (Addendum)
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   PRIOR THERAPY: :  1. Status post 6 cycles of systemic chemotherapy with Revlimid and Decadron, last dose was given 07/21/2010 with very good response. 2. 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 Lance Bosch.  3. maintenance Revlimid at 10 mg by mouth daily status post 2 months. Therapy began 01/18/2011. 4. maintenance Revlimid at 15 mg by mouth daily with prophylactic dose Coumadin at 2 mg by mouth daily. 5. Systemic chemotherapy with Velcade at 1.3 mg per meter squared given on days 1, 4, 8 and 11 and Doxil at 30 mg per meter square given on day 4 and Decadron 40 mg by mouth on weekly basis given every 3 weeks. Status post 4 cycles. 6. Zometa 4 mg IV every 4 weeks.  7. Velcade 1.3 mg/M2 subcutaneous daily on a weekly basis with Decadron 20 mg by mouth on a weekly basis. First cycle expected on 06/21/2012. S/P 4 cycles.  CURRENT THERAPY:  1) Systemic chemotherapy with Carfilzomib, cyclophosphamide and Decadron ( 20 mg by mouth once weekly), status post 1 cycle. First cycle started on 08/09/2012. He is status post 3 cycles  2)  Zometa 4 mg Iv given every 2 months   INTERVAL HISTORY: Eddie Thomas 67 y.o. male returns to the clinic today for followup visit. Overall is tolerating his chemotherapy relatively well. He reports that he is sleeping a little better since starting the Restoril.  He'll be due for repeat protein studies after completion of cycle 4 of his chemotherapy. He denied having any significant weight loss or night sweats. He has no fever or chills. He has no nausea or vomiting. The patient denied having any significant chest pain, shortness of breath, cough or hemoptysis. 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 These symptoms are stable.   MEDICAL HISTORY: Past Medical History  Diagnosis Date  . Multiple myeloma   . Diabetes mellitus 07/03/2011  . Hypertension 07/03/2011  . Hyperlipidemia 07/03/2011  . Colon polyps 2012  . Gastric ulcer     ALLERGIES:  has No Known Allergies.  MEDICATIONS:  Current Outpatient Prescriptions  Medication Sig Dispense Refill  . ACCU-CHEK AVIVA PLUS test strip       . aspirin 81 MG tablet Take 81 mg by mouth daily.      [name] dexamethasone (DECADRON) 4 MG tablet 20 Milligram by mouth every week start with the first dose of chemotherapy  80 tablet  1  . ezetimibe (ZETIA) 10 MG tablet Take 10 mg by mouth daily.        [name] lisinopril (PRINIVIL,ZESTRIL) 10 MG tablet Take 5 mg by mouth daily.       . pantoprazole (PROTONIX) 40 MG tablet Take 1 tablet (40 mg total) by mouth 2 (two) times daily.  60 tablet  11  . pioglitazone (ACTOS) 15 MG tablet Take 15 mg by mouth daily.      . prochlorperazine (COMPAZINE) 10 MG tablet Take 1 tablet (10 mg total) by mouth every 6 (six) hours as needed.  30 tablet  0  . simvastatin (ZOCOR) 10 MG tablet Take 10 mg by mouth at bedtime.        [name]  temazepam (RESTORIL) 15 MG capsule Take 1 capsule (15 mg total) by mouth at bedtime as needed for sleep.  30 capsule  0  . valACYclovir (VALTREX) 500 MG tablet Take 1 tablet (500 mg total) by mouth daily.  30 tablet  3  . haemophilus B polysaccharide conjugate vaccine (ACTHIB) injection Inject 0.5 mLs into the muscle once.  1 each  0   No current facility-administered medications for this visit.    SURGICAL HISTORY:  Past Surgical History  Procedure Laterality Date  . Limbal stem cell transplant    . Humerus fracture surgery      right    REVIEW OF SYSTEMS:  A comprehensive review of systems was negative except for: Constitutional: positive for  Insomnia Neurological: positive for paresthesia   PHYSICAL EXAMINATION: General appearance: alert, cooperative and no distress Head: Normocephalic, without obvious abnormality, atraumatic Neck: no adenopathy, no JVD, supple, symmetrical, trachea midline and thyroid not enlarged, symmetric, no tenderness/mass/nodules Lymph nodes: Cervical, supraclavicular, and axillary nodes normal. Resp: clear to auscultation bilaterally Back: symmetric, no curvature. ROM normal. No CVA tenderness. Cardio: regular rate and rhythm, S1, S2 normal, no murmur, click, rub or gallop GI: soft, non-tender; bowel sounds normal; no masses,  no organomegaly Extremities: extremities normal, atraumatic, no cyanosis or edema  ECOG PERFORMANCE STATUS: 1 - Symptomatic but completely ambulatory  Blood pressure 139/69, pulse 99, temperature 97.6 F (36.4 C), temperature source Oral, resp. rate 18, height 6\' 1"  (1.854 m), weight 174 lb 12.8 oz (79.289 kg).  LABORATORY DATA: Lab Results  Component Value Date   WBC 3.4* 11/16/2012   HGB 9.2* 11/16/2012   HCT 28.4* 11/16/2012   MCV 78.5* 11/16/2012   PLT 116* 11/16/2012      Chemistry      Component Value Date/Time   NA 142 11/16/2012 1009   NA 137 02/04/2012 2331   K 3.6 11/16/2012 1009   K 3.8 02/04/2012 2331   CL 103 07/12/2012 0907   CL 91* 02/04/2012 2331   CO2 26 11/16/2012 1009   CO2 34* 02/04/2012 2331   BUN 10.5 11/16/2012 1009   BUN 28* 02/04/2012 2331   CREATININE 0.9 11/16/2012 1009   CREATININE 1.10 02/04/2012 2331      Component Value Date/Time   CALCIUM 8.7 11/16/2012 1009   CALCIUM 9.5 02/04/2012 2331   ALKPHOS 38* 11/16/2012 1009   ALKPHOS 71 02/04/2012 2331   AST 12 11/16/2012 1009   AST 18 02/04/2012 2331   ALT 11 11/16/2012 1009   ALT 16 02/04/2012 2331   BILITOT 1.31* 11/16/2012 1009   BILITOT 1.3* 02/04/2012 2331       RADIOGRAPHIC STUDIES: No results found.  ASSESSMENT AND PLAN: This is a very pleasant 67 years old [place] American  male with history of multiple myeloma currently undergoing systemic chemotherapy with Carfilzomib, cyclophosphamide and Decadron status post 3 cycles. Patient was discussed with also seen by Dr. Arbutus Ped. He will proceed with days 15 and 16 of cycle #4 of his chemotherapy as scheduled. For his insomnia he will continue on Restoril 15 mg by mouth at bedtime as needed for insomnia.  Wewill plan to order protein studies on November 5 with him the following up with Dr. Arbutus Ped on November 30, 2012 to discuss the results of the repeat protein studies. He will continue to receive Zometa every 2 months last given in September 2014 and will be due for his next dose in November 2014.  Eddie Thomas, Eddie Nabers E, PA-C  He was advised  to call immediately if he has any concerning symptoms in the interval.  For the chemotherapy-induced anemia, I will continue to monitor the patient closely and consider him for PRBCs transfusion if his hemoglobin less than 8.0 g/dL.  The patient voices understanding of current disease status and treatment options and is in agreement with the current care plan.  All questions were answered. The patient knows to call the clinic with any problems, questions or concerns. We can certainly see the patient much sooner if necessary.  ADDENDUM: Hematology/Oncology Attending:  I had a face to face encounter with the patient. I recommended his care plan. This is a very pleasant 67 years old [place] American male with history of multiple myeloma currently undergoing systemic therapy with Carfilzomib, Cytoxan and Decadron status post 3 cycles and he is currently undergoing cycle #4. He is rating his treatment fairly well with no significant adverse effects. I will arrange for the patient to have repeat myeloma panel in one week and he would come back for follow up visit in 2 weeks for evaluation and discussion of his lab results and further recommendation regarding treatment of his condition. He was  advised to call immediately if he has any concerning symptoms in the interval. Lajuana Matte., MD 11/21/2012

## 2012-11-17 NOTE — Patient Instructions (Signed)
Continue taking Restoril 15 mg at bed time as needed for insomnia Keep your lab appointment as scheduled Follow up with Dr. Arbutus Ped in 2 weeks

## 2012-11-18 ENCOUNTER — Ambulatory Visit (HOSPITAL_BASED_OUTPATIENT_CLINIC_OR_DEPARTMENT_OTHER): Payer: Medicare Other

## 2012-11-18 VITALS — BP 132/77 | HR 81 | Temp 97.5°F | Resp 20

## 2012-11-18 DIAGNOSIS — Z5112 Encounter for antineoplastic immunotherapy: Secondary | ICD-10-CM

## 2012-11-18 DIAGNOSIS — C9002 Multiple myeloma in relapse: Secondary | ICD-10-CM

## 2012-11-18 DIAGNOSIS — C9000 Multiple myeloma not having achieved remission: Secondary | ICD-10-CM

## 2012-11-18 MED ORDER — SODIUM CHLORIDE 0.9 % IV SOLN
Freq: Once | INTRAVENOUS | Status: AC
  Administered 2012-11-18: 15:00:00 via INTRAVENOUS

## 2012-11-18 MED ORDER — ONDANSETRON 8 MG/NS 50 ML IVPB
INTRAVENOUS | Status: AC
  Filled 2012-11-18: qty 8

## 2012-11-18 MED ORDER — DEXAMETHASONE SODIUM PHOSPHATE 10 MG/ML IJ SOLN
INTRAMUSCULAR | Status: AC
  Filled 2012-11-18: qty 1

## 2012-11-18 MED ORDER — DEXTROSE 5 % IV SOLN
20.0000 mg/m2 | Freq: Once | INTRAVENOUS | Status: AC
  Administered 2012-11-18: 40 mg via INTRAVENOUS
  Filled 2012-11-18: qty 20

## 2012-11-18 MED ORDER — SODIUM CHLORIDE 0.9 % IV SOLN: Freq: Once | INTRAVENOUS | Status: DC

## 2012-11-18 MED ORDER — DEXAMETHASONE SODIUM PHOSPHATE 10 MG/ML IJ SOLN
10.0000 mg | Freq: Once | INTRAMUSCULAR | Status: AC
  Administered 2012-11-18: 10 mg via INTRAVENOUS

## 2012-11-18 MED ORDER — ONDANSETRON 8 MG/50ML IVPB (CHCC)
8.0000 mg | Freq: Once | INTRAVENOUS | Status: AC
  Administered 2012-11-18: 8 mg via INTRAVENOUS

## 2012-11-18 NOTE — Patient Instructions (Signed)
Carfilzomib injection What is this medicine? CARFILZOMIB is a chemotherapy drug that works by slowing or stopping cancer cell growth. This medicine is used to treat multiple myeloma. This medicine may be used for other purposes; ask your health care provider or pharmacist if you have questions. What should I tell my health care provider before I take this medicine? They need to know if you have any of these conditions: -heart disease -irregular heartbeat -liver disease -lung or breathing disease -an unusual or allergic reaction to carfilzomib, or other medicines, foods, dyes, or preservatives -pregnant or trying to get pregnant -breast-feeding How should I use this medicine? This medicine is for injection or infusion into a vein. It is given by a health care professional in a hospital or clinic setting. Talk to your pediatrician regarding the use of this medicine in children. Special care may be needed. Overdosage: If you think you've taken too much of this medicine contact a poison control center or emergency room at once. Overdosage: If you think you have taken too much of this medicine contact a poison control center or emergency room at once. NOTE: This medicine is only for you. Do not share this medicine with others. What if I miss a dose? It is important not to miss your dose. Call your doctor or health care professional if you are unable to keep an appointment. What may interact with this medicine? Interactions are not expected. Give your health care provider a list of all the medicines, herbs, non-prescription drugs, or dietary supplements you use. Also tell them if you smoke, drink alcohol, or use illegal drugs. Some items may interact with your medicine. This list may not describe all possible interactions. Give your health care provider a list of all the medicines, herbs, non-prescription drugs, or dietary supplements you use. Also tell them if you smoke, drink alcohol, or use  illegal drugs. Some items may interact with your medicine. What should I watch for while using this medicine? Your condition will be monitored carefully while you are receiving this medicine. Report any side effects. Continue your course of treatment even though you feel ill unless your doctor tells you to stop. Call your doctor or health care professional for advice if you get a fever, chills or sore throat, or other symptoms of a cold or flu. Do not treat yourself. Try to avoid being around people who are sick. Do not become pregnant while taking this medicine. Women should inform their doctor if they wish to become pregnant or think they might be pregnant. There is a potential for serious side effects to an unborn child. Talk to your health care professional or pharmacist for more information. Do not breast-feed an infant while taking this medicine. Check with your doctor or health care professional if you get an attack of severe diarrhea, nausea and vomiting, or if you sweat a lot. The loss of too much body fluid can make it dangerous for you to take this medicine. You may get dizzy. Do not drive, use machinery, or do anything that needs mental alertness until you know how this medicine affects you. Do not stand or sit up quickly, especially if you are an older patient. This reduces the risk of dizzy or fainting spells. What side effects may I notice from receiving this medicine? Side effects that you should report to your doctor or health care professional as soon as possible: -allergic reactions like skin rash, itching or hives, swelling of the face, lips, or tongue -breathing   problems -chest pain or palpitationschest tightness -cough -dark urine -dizziness -feeling faint or lightheaded -fever or chills -general ill feeling or flu-like symptoms -light-colored stools -palpitations -right upper belly pain -swelling of the legs or ankles -unusual bleeding or bruising -unusually weak or  tired -yellowing of the eyes or skin  Side effects that usually do not require medical attention (Report these to your doctor or health care professional if they continue or are bothersome.): -diarrhea -headache -nausea, vomiting -tiredness This list may not describe all possible side effects. Call your doctor for medical advice about side effects. You may report side effects to FDA at 1-800-FDA-1088. Where should I keep my medicine? This drug is given in a hospital or clinic and will not be stored at home. NOTE: This sheet is a summary. It may not cover all possible information. If you have questions about this medicine, talk to your doctor, pharmacist, or health care provider.  2013, [employer]. (12/26/2010 4:16:30 PM)  

## 2012-11-24 ENCOUNTER — Other Ambulatory Visit (HOSPITAL_BASED_OUTPATIENT_CLINIC_OR_DEPARTMENT_OTHER): Payer: Medicare Other | Admitting: Lab

## 2012-11-24 DIAGNOSIS — C9000 Multiple myeloma not having achieved remission: Secondary | ICD-10-CM

## 2012-11-24 DIAGNOSIS — C9002 Multiple myeloma in relapse: Secondary | ICD-10-CM

## 2012-11-24 LAB — COMPREHENSIVE METABOLIC PANEL (CC13)
AST: 13 U/L (ref 5–34)
Albumin: 3.4 g/dL — ABNORMAL LOW (ref 3.5–5.0)
Alkaline Phosphatase: 41 U/L (ref 40–150)
Anion Gap: 9 mEq/L (ref 3–11)
BUN: 8.8 mg/dL (ref 7.0–26.0)
Chloride: 106 mEq/L (ref 98–109)
Creatinine: 0.9 mg/dL (ref 0.7–1.3)
Glucose: 198 mg/dl — ABNORMAL HIGH (ref 70–140)

## 2012-11-24 LAB — CBC WITH DIFFERENTIAL/PLATELET
Basophils Absolute: 0 10*3/uL (ref 0.0–0.1)
EOS%: 0.4 % (ref 0.0–7.0)
Eosinophils Absolute: 0 10*3/uL (ref 0.0–0.5)
HCT: 29 % — ABNORMAL LOW (ref 38.4–49.9)
HGB: 9.2 g/dL — ABNORMAL LOW (ref 13.0–17.1)
LYMPH%: 6.7 % — ABNORMAL LOW (ref 14.0–49.0)
MCH: 25.3 pg — ABNORMAL LOW (ref 27.2–33.4)
MCV: 79.5 fL (ref 79.3–98.0)
MONO#: 0.2 10*3/uL (ref 0.1–0.9)
MONO%: 4.3 % (ref 0.0–14.0)
NEUT#: 3.8 10*3/uL (ref 1.5–6.5)
NEUT%: 88.4 % — ABNORMAL HIGH (ref 39.0–75.0)
Platelets: 132 10*3/uL — ABNORMAL LOW (ref 140–400)
RBC: 3.64 10*6/uL — ABNORMAL LOW (ref 4.20–5.82)

## 2012-11-26 LAB — KAPPA/LAMBDA LIGHT CHAINS
Kappa free light chain: <0.03 mg/dL — ABNORMAL LOW (ref 0.33–1.94)
Lambda Free Lght Chn: 3.62 mg/dL — ABNORMAL HIGH (ref 0.57–2.63)

## 2012-11-26 LAB — IGG, IGA, IGM
IgA: 489 mg/dL — ABNORMAL HIGH (ref 68–379)
IgG (Immunoglobin G), Serum: 214 mg/dL — ABNORMAL LOW (ref 650–1600)
IgM, Serum: 10 mg/dL — ABNORMAL LOW (ref 41–251)

## 2012-11-30 ENCOUNTER — Telehealth: Payer: Self-pay | Admitting: Internal Medicine

## 2012-11-30 ENCOUNTER — Ambulatory Visit (HOSPITAL_BASED_OUTPATIENT_CLINIC_OR_DEPARTMENT_OTHER): Payer: Medicare Other | Admitting: Physician Assistant

## 2012-11-30 ENCOUNTER — Encounter: Payer: Self-pay | Admitting: Physician Assistant

## 2012-11-30 ENCOUNTER — Other Ambulatory Visit (HOSPITAL_BASED_OUTPATIENT_CLINIC_OR_DEPARTMENT_OTHER): Payer: Medicare Other

## 2012-11-30 VITALS — BP 155/82 | HR 59 | Temp 97.0°F | Resp 18 | Ht 73.0 in | Wt 176.3 lb

## 2012-11-30 DIAGNOSIS — C9000 Multiple myeloma not having achieved remission: Secondary | ICD-10-CM

## 2012-11-30 LAB — CBC WITH DIFFERENTIAL/PLATELET
BASO%: 0.5 % (ref 0.0–2.0)
Eosinophils Absolute: 0 10*3/uL (ref 0.0–0.5)
HCT: 31.2 % — ABNORMAL LOW (ref 38.4–49.9)
LYMPH%: 20.8 % (ref 14.0–49.0)
MCV: 80.4 fL (ref 79.3–98.0)
MONO#: 0.4 10*3/uL (ref 0.1–0.9)
MONO%: 13.5 % (ref 0.0–14.0)
NEUT#: 2 10*3/uL (ref 1.5–6.5)
NEUT%: 64.1 % (ref 39.0–75.0)
Platelets: 141 10*3/uL (ref 140–400)
RBC: 3.89 10*6/uL — ABNORMAL LOW (ref 4.20–5.82)
WBC: 3.1 10*3/uL — ABNORMAL LOW (ref 4.0–10.3)

## 2012-11-30 LAB — COMPREHENSIVE METABOLIC PANEL (CC13)
ALT: 12 U/L (ref 0–55)
Alkaline Phosphatase: 41 U/L (ref 40–150)
Anion Gap: 9 mEq/L (ref 3–11)
BUN: 11 mg/dL (ref 7.0–26.0)
CO2: 23 mEq/L (ref 22–29)
Creatinine: 0.9 mg/dL (ref 0.7–1.3)
Glucose: 120 mg/dl (ref 70–140)
Total Bilirubin: 1.01 mg/dL (ref 0.20–1.20)

## 2012-11-30 NOTE — Patient Instructions (Signed)
Follow-up in 2 weeks

## 2012-11-30 NOTE — Telephone Encounter (Signed)
gv and printed appt sched and avs for pt for NOV...sed added tx. °

## 2012-11-30 NOTE — Progress Notes (Addendum)
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   PRIOR THERAPY: :  1. Status post 6 cycles of systemic chemotherapy with Revlimid and Decadron, last dose was given 07/21/2010 with very good response. 2. 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 Lance Bosch.  3. maintenance Revlimid at 10 mg by mouth daily status post 2 months. Therapy began 01/18/2011. 4. maintenance Revlimid at 15 mg by mouth daily with prophylactic dose Coumadin at 2 mg by mouth daily. 5. Systemic chemotherapy with Velcade at 1.3 mg per meter squared given on days 1, 4, 8 and 11 and Doxil at 30 mg per meter square given on day 4 and Decadron 40 mg by mouth on weekly basis given every 3 weeks. Status post 4 cycles. 6. Zometa 4 mg IV every 4 weeks.  7. Velcade 1.3 mg/M2 subcutaneous daily on a weekly basis with Decadron 20 mg by mouth on a weekly basis. First cycle expected on 06/21/2012. S/P 4 cycles.  CURRENT THERAPY:  1) Systemic chemotherapy with Carfilzomib, cyclophosphamide and Decadron ( 20 mg by mouth once weekly), status post 1 cycle. First cycle started on 08/09/2012. He is status post 4 cycles  2)  Zometa 4 mg Iv given every 2 months   INTERVAL HISTORY: Eddie Thomas 67 y.o. male returns to the clinic today for followup visit. Overall is tolerating his chemotherapy relatively well. He continues to report that he is sleeping a little better since starting the Restoril. He continues to have some numbness and tingling in his feet but states that these symptoms are stable. He recently had a repeat test myeloma panel performed and presents to discuss the results of those studies prior to proceeding with cycle 5 of his systemic chemotherapy. He denied having  any significant weight loss or night sweats. He has no fever or chills. He has no nausea or vomiting. The patient denied having any significant chest pain, shortness of breath, cough or hemoptysis. 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    MEDICAL HISTORY: Past Medical History  Diagnosis Date  . Multiple myeloma   . Diabetes mellitus 07/03/2011  . Hypertension 07/03/2011  . Hyperlipidemia 07/03/2011  . Colon polyps 2012  . Gastric ulcer     ALLERGIES:  has No Known Allergies.  MEDICATIONS:  Current Outpatient Prescriptions  Medication Sig Dispense Refill  . ACCU-CHEK AVIVA PLUS test strip       . aspirin 81 MG tablet Take 81 mg by mouth daily.      [name] dexamethasone (DECADRON) 4 MG tablet 20 Milligram by mouth every week start with the first dose of chemotherapy  80 tablet  1  . ezetimibe (ZETIA) 10 MG tablet Take 10 mg by mouth daily.        . haemophilus B polysaccharide conjugate vaccine (ACTHIB) injection Inject 0.5 mLs into the muscle once.  1 each  0  . lisinopril (PRINIVIL,ZESTRIL) 10 MG tablet Take 5 mg by mouth daily.       . pantoprazole (PROTONIX) 40 MG tablet Take 1 tablet (40 mg total) by mouth 2 (two) times daily.  60 tablet  11  . pioglitazone (ACTOS) 15 MG tablet Take 15  mg by mouth daily.      . simvastatin (ZOCOR) 10 MG tablet Take 10 mg by mouth at bedtime.        . temazepam (RESTORIL) 15 MG capsule Take 1 capsule (15 mg total) by mouth at bedtime as needed for sleep.  30 capsule  0  . valACYclovir (VALTREX) 500 MG tablet Take 1 tablet (500 mg total) by mouth daily.  30 tablet  3  . prochlorperazine (COMPAZINE) 10 MG tablet Take 1 tablet (10 mg total) by mouth every 6 (six) hours as needed.  30 tablet  0   No current facility-administered medications for this visit.    SURGICAL HISTORY:  Past Surgical History  Procedure Laterality Date  . Limbal stem  cell transplant    . Humerus fracture surgery      right    REVIEW OF SYSTEMS:  A comprehensive review of systems was negative except for: Constitutional: positive for Insomnia Neurological: positive for paresthesia   PHYSICAL EXAMINATION: General appearance: alert, cooperative and no distress Head: Normocephalic, without obvious abnormality, atraumatic Neck: no adenopathy, no JVD, supple, symmetrical, trachea midline and thyroid not enlarged, symmetric, no tenderness/mass/nodules Lymph nodes: Cervical, supraclavicular, and axillary nodes normal. Resp: clear to auscultation bilaterally Back: symmetric, no curvature. ROM normal. No CVA tenderness. Cardio: regular rate and rhythm, S1, S2 normal, no murmur, click, rub or gallop GI: soft, non-tender; bowel sounds normal; no masses,  no organomegaly Extremities: extremities normal, atraumatic, no cyanosis or edema  ECOG PERFORMANCE STATUS: 1 - Symptomatic but completely ambulatory  Blood pressure 155/82, pulse 59, temperature 97 F (36.1 C), temperature source Oral, resp. rate 18, height 6\' 1"  (1.854 m), weight 176 lb 4.8 oz (79.969 kg).  LABORATORY DATA: Lab Results  Component Value Date   WBC 3.1* 11/30/2012   HGB 9.7* 11/30/2012   HCT 31.2* 11/30/2012   MCV 80.4 11/30/2012   PLT 141 11/30/2012      Chemistry      Component Value Date/Time   NA 141 11/30/2012 0910   NA 137 02/04/2012 2331   K 4.0 11/30/2012 0910   K 3.8 02/04/2012 2331   CL 103 07/12/2012 0907   CL 91* 02/04/2012 2331   CO2 23 11/30/2012 0910   CO2 34* 02/04/2012 2331   BUN 11.0 11/30/2012 0910   BUN 28* 02/04/2012 2331   CREATININE 0.9 11/30/2012 0910   CREATININE 1.10 02/04/2012 2331      Component Value Date/Time   CALCIUM 9.2 11/30/2012 0910   CALCIUM 9.5 02/04/2012 2331   ALKPHOS 41 11/30/2012 0910   ALKPHOS 71 02/04/2012 2331   AST 13 11/30/2012 0910   AST 18 02/04/2012 2331   ALT 12 11/30/2012 0910   ALT 16 02/04/2012 2331   BILITOT 1.01 11/30/2012  0910   BILITOT 1.3* 02/04/2012 2331     IgG 214, IgA 489, IgM 10, beta 2 marker globin 1.75, kappa free light chain less than 0.03, lambda free light chain 3.6 to, kappa lambda ratio was calculated   RADIOGRAPHIC STUDIES: No results found.  ASSESSMENT AND PLAN: This is a very pleasant 67 years old [place] American male with history of multiple myeloma currently undergoing systemic chemotherapy with Carfilzomib, cyclophosphamide and Decadron status post 4 cycles. Patient was discussed with also seen by Dr. Arbutus Ped. The patient's myeloma panel was reviewed and revealed significant improvement and has IgA level, previously 3910 now 489. His remaining parameters are approaching normal range. Overall the patient is tolerating his current chemotherapy relatively well  he will proceed with cycle #5 as scheduled. He'll followup in 2 weeks for a symptom management visit. He will continue to receive Zometa every 2 months last given in September 2014 and will be due for his next dose in November 2014.  Laural Benes, Shakeda Pearse E, PA-C  He was advised to call immediately if he has any concerning symptoms in the interval.  For the chemotherapy-induced anemia, I will continue to monitor the patient closely and consider him for PRBCs transfusion if his hemoglobin less than 8.0 g/dL.  The patient voices understanding of current disease status and treatment options and is in agreement with the current care plan.  All questions were answered. The patient knows to call the clinic with any problems, questions or concerns. We can certainly see the patient much sooner if necessary.  ADDENDUM: Hematology/Oncology Attending: I had a face to face encounter with the patient.. I recommended his care plan.this is a very pleasant 67 years old [place] American male with recurrent multiple myeloma who is currently undergoing systemic chemotherapy with Carfilzomib, Cytoxan and Decadron status post 4 cycles with significant improvement  in his disease. I discussed the myeloma panel with the patient today and recommended for him to continue the same treatment with Carfilzomib but will increase the dose to 27 mg/M2 on days 1, 2, 8, 9, 15 and 16 every 4 weeks in addition to his current dose of Cytoxan and Decadron. The patient would come back for follow up visit in 2 weeks for reevaluation and management any adverse effect of his treatment. For the bone disease he will continue on Zometa every 2 months. Patient was advised to call immediately if he has any concerning symptoms in the interval. Lajuana Matte., MD 12/01/2012

## 2012-12-01 ENCOUNTER — Ambulatory Visit (HOSPITAL_BASED_OUTPATIENT_CLINIC_OR_DEPARTMENT_OTHER): Payer: Medicare Other

## 2012-12-01 VITALS — BP 134/63 | HR 56 | Temp 98.3°F | Resp 18

## 2012-12-01 DIAGNOSIS — C9002 Multiple myeloma in relapse: Secondary | ICD-10-CM

## 2012-12-01 DIAGNOSIS — Z5112 Encounter for antineoplastic immunotherapy: Secondary | ICD-10-CM

## 2012-12-01 DIAGNOSIS — C9000 Multiple myeloma not having achieved remission: Secondary | ICD-10-CM

## 2012-12-01 MED ORDER — ONDANSETRON 8 MG/50ML IVPB (CHCC)
8.0000 mg | Freq: Once | INTRAVENOUS | Status: AC
  Administered 2012-12-01: 8 mg via INTRAVENOUS

## 2012-12-01 MED ORDER — DEXAMETHASONE SODIUM PHOSPHATE 10 MG/ML IJ SOLN
INTRAMUSCULAR | Status: AC
  Filled 2012-12-01: qty 1

## 2012-12-01 MED ORDER — SODIUM CHLORIDE 0.9 % IV SOLN
Freq: Once | INTRAVENOUS | Status: AC
  Administered 2012-12-01: 09:00:00 via INTRAVENOUS

## 2012-12-01 MED ORDER — DEXTROSE 5 % IV SOLN
27.0000 mg/m2 | Freq: Once | INTRAVENOUS | Status: AC
  Administered 2012-12-01: 54 mg via INTRAVENOUS
  Filled 2012-12-01: qty 27

## 2012-12-01 MED ORDER — ONDANSETRON 8 MG/NS 50 ML IVPB
INTRAVENOUS | Status: AC
  Filled 2012-12-01: qty 8

## 2012-12-01 MED ORDER — SODIUM CHLORIDE 0.9 % IV SOLN
Freq: Once | INTRAVENOUS | Status: AC
  Administered 2012-12-01: 11:00:00 via INTRAVENOUS

## 2012-12-01 MED ORDER — SODIUM CHLORIDE 0.9 % IV SOLN
300.0000 mg/m2 | Freq: Once | INTRAVENOUS | Status: AC
  Administered 2012-12-01: 600 mg via INTRAVENOUS
  Filled 2012-12-01: qty 30

## 2012-12-01 MED ORDER — DEXAMETHASONE SODIUM PHOSPHATE 10 MG/ML IJ SOLN
10.0000 mg | Freq: Once | INTRAMUSCULAR | Status: AC
  Administered 2012-12-01: 10 mg via INTRAVENOUS

## 2012-12-01 NOTE — Patient Instructions (Signed)
[facility] Discharge Instructions for Patients Receiving Chemotherapy  Today you received the following chemotherapy agents Kyprolis and Cytoxan.  To help prevent nausea and vomiting after your treatment, we encourage you to take your nausea medication.   If you develop nausea and vomiting that is not controlled by your nausea medication, call the clinic.   BELOW ARE SYMPTOMS THAT SHOULD BE REPORTED IMMEDIATELY:  *FEVER GREATER THAN 100.5 F  *CHILLS WITH OR WITHOUT FEVER  NAUSEA AND VOMITING THAT IS NOT CONTROLLED WITH YOUR NAUSEA MEDICATION  *UNUSUAL SHORTNESS OF BREATH  *UNUSUAL BRUISING OR BLEEDING  TENDERNESS IN MOUTH AND THROAT WITH OR WITHOUT PRESENCE OF ULCERS  *URINARY PROBLEMS  *BOWEL PROBLEMS  UNUSUAL RASH Items with * indicate a potential emergency and should be followed up as soon as possible.  Feel free to call the clinic you have any questions or concerns. The clinic phone number is [phone].    

## 2012-12-02 ENCOUNTER — Ambulatory Visit (HOSPITAL_BASED_OUTPATIENT_CLINIC_OR_DEPARTMENT_OTHER): Payer: Medicare Other

## 2012-12-02 VITALS — BP 138/75 | HR 60 | Temp 97.6°F | Resp 18

## 2012-12-02 DIAGNOSIS — C9000 Multiple myeloma not having achieved remission: Secondary | ICD-10-CM

## 2012-12-02 DIAGNOSIS — Z5112 Encounter for antineoplastic immunotherapy: Secondary | ICD-10-CM

## 2012-12-02 MED ORDER — DEXAMETHASONE SODIUM PHOSPHATE 10 MG/ML IJ SOLN
INTRAMUSCULAR | Status: AC
  Filled 2012-12-02: qty 1

## 2012-12-02 MED ORDER — DEXTROSE 5 % IV SOLN
27.0000 mg/m2 | Freq: Once | INTRAVENOUS | Status: AC
  Administered 2012-12-02: 54 mg via INTRAVENOUS
  Filled 2012-12-02: qty 27

## 2012-12-02 MED ORDER — SODIUM CHLORIDE 0.9 % IV SOLN
Freq: Once | INTRAVENOUS | Status: AC
  Administered 2012-12-02: 10:00:00 via INTRAVENOUS

## 2012-12-02 MED ORDER — ONDANSETRON 8 MG/50ML IVPB (CHCC)
8.0000 mg | Freq: Once | INTRAVENOUS | Status: AC
  Administered 2012-12-02: 8 mg via INTRAVENOUS

## 2012-12-02 MED ORDER — ONDANSETRON 8 MG/NS 50 ML IVPB
INTRAVENOUS | Status: AC
  Filled 2012-12-02: qty 8

## 2012-12-02 MED ORDER — DEXAMETHASONE SODIUM PHOSPHATE 10 MG/ML IJ SOLN
10.0000 mg | Freq: Once | INTRAMUSCULAR | Status: AC
  Administered 2012-12-02: 10 mg via INTRAVENOUS

## 2012-12-02 NOTE — Patient Instructions (Signed)
[facility] Discharge Instructions for Patients Receiving Chemotherapy  Today you received the following chemotherapy agents Kyprolis To help prevent nausea and vomiting after your treatment, we encourage you to take your nausea medication as prescribed.  If you develop nausea and vomiting that is not controlled by your nausea medication, call the clinic.   BELOW ARE SYMPTOMS THAT SHOULD BE REPORTED IMMEDIATELY:  *FEVER GREATER THAN 100.5 F  *CHILLS WITH OR WITHOUT FEVER  NAUSEA AND VOMITING THAT IS NOT CONTROLLED WITH YOUR NAUSEA MEDICATION  *UNUSUAL SHORTNESS OF BREATH  *UNUSUAL BRUISING OR BLEEDING  TENDERNESS IN MOUTH AND THROAT WITH OR WITHOUT PRESENCE OF ULCERS  *URINARY PROBLEMS  *BOWEL PROBLEMS  UNUSUAL RASH Items with * indicate a potential emergency and should be followed up as soon as possible.  Feel free to call the clinic you have any questions or concerns. The clinic phone number is [phone].    

## 2012-12-08 ENCOUNTER — Ambulatory Visit (HOSPITAL_BASED_OUTPATIENT_CLINIC_OR_DEPARTMENT_OTHER): Payer: Medicare Other

## 2012-12-08 ENCOUNTER — Other Ambulatory Visit (HOSPITAL_BASED_OUTPATIENT_CLINIC_OR_DEPARTMENT_OTHER): Payer: Medicare Other | Admitting: Lab

## 2012-12-08 VITALS — BP 154/85 | HR 60 | Temp 97.7°F | Resp 20

## 2012-12-08 DIAGNOSIS — C9000 Multiple myeloma not having achieved remission: Secondary | ICD-10-CM

## 2012-12-08 DIAGNOSIS — Z5112 Encounter for antineoplastic immunotherapy: Secondary | ICD-10-CM

## 2012-12-08 LAB — CBC WITH DIFFERENTIAL/PLATELET
BASO%: 0.2 % (ref 0.0–2.0)
Basophils Absolute: 0 10*3/uL (ref 0.0–0.1)
EOS%: 0.8 % (ref 0.0–7.0)
Eosinophils Absolute: 0.1 10*3/uL (ref 0.0–0.5)
HCT: 31.6 % — ABNORMAL LOW (ref 38.4–49.9)
HGB: 10 g/dL — ABNORMAL LOW (ref 13.0–17.1)
MCH: 24.6 pg — ABNORMAL LOW (ref 27.2–33.4)
MCV: 77.8 fL — ABNORMAL LOW (ref 79.3–98.0)
MONO#: 0.5 10*3/uL (ref 0.1–0.9)
NEUT#: 5.1 10*3/uL (ref 1.5–6.5)
NEUT%: 79.3 % — ABNORMAL HIGH (ref 39.0–75.0)
RDW: 14 % (ref 11.0–14.6)
lymph#: 0.8 10*3/uL — ABNORMAL LOW (ref 0.9–3.3)

## 2012-12-08 LAB — COMPREHENSIVE METABOLIC PANEL (CC13)
Albumin: 3.7 g/dL (ref 3.5–5.0)
Anion Gap: 10 mEq/L (ref 3–11)
BUN: 9.8 mg/dL (ref 7.0–26.0)
CO2: 23 mEq/L (ref 22–29)
Calcium: 9.3 mg/dL (ref 8.4–10.4)
Chloride: 106 mEq/L (ref 98–109)
Glucose: 128 mg/dl (ref 70–140)
Potassium: 3.9 mEq/L (ref 3.5–5.1)
Sodium: 140 mEq/L (ref 136–145)
Total Protein: 6.3 g/dL — ABNORMAL LOW (ref 6.4–8.3)

## 2012-12-08 MED ORDER — ONDANSETRON 8 MG/NS 50 ML IVPB
INTRAVENOUS | Status: AC
  Filled 2012-12-08: qty 8

## 2012-12-08 MED ORDER — SODIUM CHLORIDE 0.9 % IV SOLN
Freq: Once | INTRAVENOUS | Status: AC
  Administered 2012-12-08: 10:00:00 via INTRAVENOUS

## 2012-12-08 MED ORDER — CARFILZOMIB CHEMO INJECTION 60 MG
27.0000 mg/m2 | Freq: Once | INTRAVENOUS | Status: AC
  Administered 2012-12-08: 54 mg via INTRAVENOUS
  Filled 2012-12-08: qty 27

## 2012-12-08 MED ORDER — DEXAMETHASONE SODIUM PHOSPHATE 10 MG/ML IJ SOLN
10.0000 mg | Freq: Once | INTRAMUSCULAR | Status: AC
  Administered 2012-12-08: 10 mg via INTRAVENOUS

## 2012-12-08 MED ORDER — SODIUM CHLORIDE 0.9 % IV SOLN
Freq: Once | INTRAVENOUS | Status: AC
  Administered 2012-12-08: 09:00:00 via INTRAVENOUS

## 2012-12-08 MED ORDER — ONDANSETRON 8 MG/50ML IVPB (CHCC)
8.0000 mg | Freq: Once | INTRAVENOUS | Status: AC
  Administered 2012-12-08: 8 mg via INTRAVENOUS

## 2012-12-08 MED ORDER — SODIUM CHLORIDE 0.9 % IV SOLN
300.0000 mg/m2 | Freq: Once | INTRAVENOUS | Status: AC
  Administered 2012-12-08: 600 mg via INTRAVENOUS
  Filled 2012-12-08: qty 30

## 2012-12-08 MED ORDER — DEXAMETHASONE SODIUM PHOSPHATE 10 MG/ML IJ SOLN
INTRAMUSCULAR | Status: AC
  Filled 2012-12-08: qty 1

## 2012-12-08 NOTE — Progress Notes (Signed)
Chaplain made follow-up visit with patient. Patient and chaplain discussed the conclusion of [facility], the support group for those living with cancer on a long-term basis. Pt expressed how much he enjoyed the group and the other members. Pt said that everything is going well with his treatment. Chp and pt chatted about Thanksgiving plans. Pt thanked chaplain for visit.

## 2012-12-08 NOTE — Patient Instructions (Signed)
[facility] Discharge Instructions for Patients Receiving Chemotherapy  Today you received the following chemotherapy agents :  Kyprolis, Cytoxan.  To help prevent nausea and vomiting after your treatment, we encourage you to take your nausea medication as instructed by your physician.   If you develop nausea and vomiting that is not controlled by your nausea medication, call the clinic.   BELOW ARE SYMPTOMS THAT SHOULD BE REPORTED IMMEDIATELY:  *FEVER GREATER THAN 100.5 F  *CHILLS WITH OR WITHOUT FEVER  NAUSEA AND VOMITING THAT IS NOT CONTROLLED WITH YOUR NAUSEA MEDICATION  *UNUSUAL SHORTNESS OF BREATH  *UNUSUAL BRUISING OR BLEEDING  TENDERNESS IN MOUTH AND THROAT WITH OR WITHOUT PRESENCE OF ULCERS  *URINARY PROBLEMS  *BOWEL PROBLEMS  UNUSUAL RASH Items with * indicate a potential emergency and should be followed up as soon as possible.  Feel free to call the clinic you have any questions or concerns. The clinic phone number is [phone].    

## 2012-12-08 NOTE — Progress Notes (Signed)
Per Dr. Arbutus Ped, ok to treat pt today with platelet count 92.  Explanations given to pt.

## 2012-12-09 ENCOUNTER — Ambulatory Visit (HOSPITAL_BASED_OUTPATIENT_CLINIC_OR_DEPARTMENT_OTHER): Payer: Medicare Other

## 2012-12-09 VITALS — BP 142/76 | HR 58 | Temp 97.9°F | Resp 17

## 2012-12-09 DIAGNOSIS — Z5112 Encounter for antineoplastic immunotherapy: Secondary | ICD-10-CM

## 2012-12-09 DIAGNOSIS — C9000 Multiple myeloma not having achieved remission: Secondary | ICD-10-CM

## 2012-12-09 DIAGNOSIS — C9002 Multiple myeloma in relapse: Secondary | ICD-10-CM

## 2012-12-09 MED ORDER — SODIUM CHLORIDE 0.9 % IV SOLN
Freq: Once | INTRAVENOUS | Status: AC
  Administered 2012-12-09: 10:00:00 via INTRAVENOUS

## 2012-12-09 MED ORDER — ONDANSETRON 8 MG/50ML IVPB (CHCC)
8.0000 mg | Freq: Once | INTRAVENOUS | Status: AC
  Administered 2012-12-09: 8 mg via INTRAVENOUS

## 2012-12-09 MED ORDER — DEXAMETHASONE SODIUM PHOSPHATE 10 MG/ML IJ SOLN
INTRAMUSCULAR | Status: AC
  Filled 2012-12-09: qty 1

## 2012-12-09 MED ORDER — HEPARIN SOD (PORK) LOCK FLUSH 100 UNIT/ML IV SOLN
500.0000 [IU] | Freq: Once | INTRAVENOUS | Status: DC | PRN
  Filled 2012-12-09: qty 5

## 2012-12-09 MED ORDER — ONDANSETRON 8 MG/NS 50 ML IVPB
INTRAVENOUS | Status: AC
  Filled 2012-12-09: qty 8

## 2012-12-09 MED ORDER — DEXTROSE 5 % IV SOLN
27.0000 mg/m2 | Freq: Once | INTRAVENOUS | Status: AC
  Administered 2012-12-09: 54 mg via INTRAVENOUS
  Filled 2012-12-09: qty 27

## 2012-12-09 MED ORDER — SODIUM CHLORIDE 0.9 % IJ SOLN
10.0000 mL | INTRAMUSCULAR | Status: DC | PRN
  Filled 2012-12-09: qty 10

## 2012-12-09 MED ORDER — ZOLEDRONIC ACID 4 MG/100ML IV SOLN
4.0000 mg | Freq: Once | INTRAVENOUS | Status: AC
  Administered 2012-12-09: 4 mg via INTRAVENOUS
  Filled 2012-12-09: qty 100

## 2012-12-09 MED ORDER — DEXAMETHASONE SODIUM PHOSPHATE 10 MG/ML IJ SOLN
10.0000 mg | Freq: Once | INTRAMUSCULAR | Status: AC
  Administered 2012-12-09: 10 mg via INTRAVENOUS

## 2012-12-09 MED ORDER — SODIUM CHLORIDE 0.9 % IJ SOLN
3.0000 mL | Freq: Once | INTRAMUSCULAR | Status: DC | PRN
  Filled 2012-12-09: qty 10

## 2012-12-09 MED ORDER — ALTEPLASE 2 MG IJ SOLR
2.0000 mg | Freq: Once | INTRAMUSCULAR | Status: DC | PRN
  Filled 2012-12-09: qty 2

## 2012-12-09 MED ORDER — HEPARIN SOD (PORK) LOCK FLUSH 100 UNIT/ML IV SOLN
250.0000 [IU] | Freq: Once | INTRAVENOUS | Status: DC | PRN
  Filled 2012-12-09: qty 5

## 2012-12-09 NOTE — Patient Instructions (Signed)
[facility] Discharge Instructions for Patients Receiving Chemotherapy  Today you received the following chemotherapy agents Kyprolis and Zometa.  To help prevent nausea and vomiting after your treatment, we encourage you to take your nausea medication.   If you develop nausea and vomiting that is not controlled by your nausea medication, call the clinic.   BELOW ARE SYMPTOMS THAT SHOULD BE REPORTED IMMEDIATELY:  *FEVER GREATER THAN 100.5 F  *CHILLS WITH OR WITHOUT FEVER  NAUSEA AND VOMITING THAT IS NOT CONTROLLED WITH YOUR NAUSEA MEDICATION  *UNUSUAL SHORTNESS OF BREATH  *UNUSUAL BRUISING OR BLEEDING  TENDERNESS IN MOUTH AND THROAT WITH OR WITHOUT PRESENCE OF ULCERS  *URINARY PROBLEMS  *BOWEL PROBLEMS  UNUSUAL RASH Items with * indicate a potential emergency and should be followed up as soon as possible.  Feel free to call the clinic you have any questions or concerns. The clinic phone number is [phone].    

## 2012-12-14 ENCOUNTER — Ambulatory Visit (HOSPITAL_BASED_OUTPATIENT_CLINIC_OR_DEPARTMENT_OTHER): Payer: Medicare Other | Admitting: Internal Medicine

## 2012-12-14 ENCOUNTER — Telehealth: Payer: Self-pay | Admitting: Internal Medicine

## 2012-12-14 ENCOUNTER — Encounter: Payer: Self-pay | Admitting: Internal Medicine

## 2012-12-14 ENCOUNTER — Other Ambulatory Visit (HOSPITAL_BASED_OUTPATIENT_CLINIC_OR_DEPARTMENT_OTHER): Payer: Medicare Other | Admitting: Lab

## 2012-12-14 ENCOUNTER — Ambulatory Visit (HOSPITAL_BASED_OUTPATIENT_CLINIC_OR_DEPARTMENT_OTHER): Payer: Medicare Other

## 2012-12-14 VITALS — BP 156/74 | HR 64 | Temp 98.8°F | Resp 18 | Ht 73.0 in | Wt 176.8 lb

## 2012-12-14 DIAGNOSIS — C9000 Multiple myeloma not having achieved remission: Secondary | ICD-10-CM

## 2012-12-14 DIAGNOSIS — I10 Essential (primary) hypertension: Secondary | ICD-10-CM

## 2012-12-14 DIAGNOSIS — Z5112 Encounter for antineoplastic immunotherapy: Secondary | ICD-10-CM

## 2012-12-14 DIAGNOSIS — Z5111 Encounter for antineoplastic chemotherapy: Secondary | ICD-10-CM

## 2012-12-14 DIAGNOSIS — C9002 Multiple myeloma in relapse: Secondary | ICD-10-CM

## 2012-12-14 DIAGNOSIS — E119 Type 2 diabetes mellitus without complications: Secondary | ICD-10-CM

## 2012-12-14 LAB — CBC WITH DIFFERENTIAL/PLATELET
BASO%: 0.1 % (ref 0.0–2.0)
Basophils Absolute: 0 10*3/uL (ref 0.0–0.1)
EOS%: 0.6 % (ref 0.0–7.0)
Eosinophils Absolute: 0 10*3/uL (ref 0.0–0.5)
HGB: 9.3 g/dL — ABNORMAL LOW (ref 13.0–17.1)
MCH: 24.6 pg — ABNORMAL LOW (ref 27.2–33.4)
MCHC: 32 g/dL (ref 32.0–36.0)
MCV: 77 fL — ABNORMAL LOW (ref 79.3–98.0)
MONO%: 4.6 % (ref 0.0–14.0)
RBC: 3.78 10*6/uL — ABNORMAL LOW (ref 4.20–5.82)
RDW: 13.9 % (ref 11.0–14.6)
lymph#: 0.5 10*3/uL — ABNORMAL LOW (ref 0.9–3.3)
nRBC: 0 % (ref 0–0)

## 2012-12-14 LAB — COMPREHENSIVE METABOLIC PANEL (CC13)
ALT: 12 U/L (ref 0–55)
AST: 13 U/L (ref 5–34)
Alkaline Phosphatase: 51 U/L (ref 40–150)
CO2: 23 mEq/L (ref 22–29)
Calcium: 9.1 mg/dL (ref 8.4–10.4)
Glucose: 157 mg/dl — ABNORMAL HIGH (ref 70–140)
Sodium: 139 mEq/L (ref 136–145)
Total Bilirubin: 1.54 mg/dL — ABNORMAL HIGH (ref 0.20–1.20)
Total Protein: 6.6 g/dL (ref 6.4–8.3)

## 2012-12-14 LAB — TECHNOLOGIST REVIEW

## 2012-12-14 MED ORDER — SODIUM CHLORIDE 0.9 % IV SOLN
Freq: Once | INTRAVENOUS | Status: AC
  Administered 2012-12-14: 12:00:00 via INTRAVENOUS

## 2012-12-14 MED ORDER — DEXAMETHASONE SODIUM PHOSPHATE 10 MG/ML IJ SOLN
INTRAMUSCULAR | Status: AC
Start: 2012-12-14 — End: 2012-12-14
  Filled 2012-12-14: qty 1

## 2012-12-14 MED ORDER — DEXAMETHASONE SODIUM PHOSPHATE 10 MG/ML IJ SOLN
10.0000 mg | Freq: Once | INTRAMUSCULAR | Status: AC
  Administered 2012-12-14: 10 mg via INTRAVENOUS

## 2012-12-14 MED ORDER — SODIUM CHLORIDE 0.9 % IV SOLN
Freq: Once | INTRAVENOUS | Status: AC
  Administered 2012-12-14: 11:00:00 via INTRAVENOUS

## 2012-12-14 MED ORDER — SODIUM CHLORIDE 0.9 % IV SOLN
300.0000 mg/m2 | Freq: Once | INTRAVENOUS | Status: AC
  Administered 2012-12-14: 600 mg via INTRAVENOUS
  Filled 2012-12-14: qty 30

## 2012-12-14 MED ORDER — ONDANSETRON 8 MG/NS 50 ML IVPB
INTRAVENOUS | Status: AC
  Filled 2012-12-14: qty 8

## 2012-12-14 MED ORDER — ONDANSETRON 8 MG/50ML IVPB (CHCC)
8.0000 mg | Freq: Once | INTRAVENOUS | Status: AC
  Administered 2012-12-14: 8 mg via INTRAVENOUS

## 2012-12-14 MED ORDER — DEXTROSE 5 % IV SOLN
27.0000 mg/m2 | Freq: Once | INTRAVENOUS | Status: AC
  Administered 2012-12-14: 54 mg via INTRAVENOUS
  Filled 2012-12-14: qty 27

## 2012-12-14 NOTE — Patient Instructions (Signed)
Continue chemotherapy treatment today as scheduled. Followup visit in 2 weeks with the start of cycle #6

## 2012-12-14 NOTE — Patient Instructions (Signed)
[facility] Discharge Instructions for Patients Receiving Chemotherapy  Today you received the following chemotherapy agents :  Kyprolis, Cytoxan.  To help prevent nausea and vomiting after your treatment, we encourage you to take your nausea medication as instructed by your physician.   If you develop nausea and vomiting that is not controlled by your nausea medication, call the clinic.   BELOW ARE SYMPTOMS THAT SHOULD BE REPORTED IMMEDIATELY:  *FEVER GREATER THAN 100.5 F  *CHILLS WITH OR WITHOUT FEVER  NAUSEA AND VOMITING THAT IS NOT CONTROLLED WITH YOUR NAUSEA MEDICATION  *UNUSUAL SHORTNESS OF BREATH  *UNUSUAL BRUISING OR BLEEDING  TENDERNESS IN MOUTH AND THROAT WITH OR WITHOUT PRESENCE OF ULCERS  *URINARY PROBLEMS  *BOWEL PROBLEMS  UNUSUAL RASH Items with * indicate a potential emergency and should be followed up as soon as possible.  Feel free to call the clinic you have any questions or concerns. The clinic phone number is [phone].

## 2012-12-14 NOTE — Telephone Encounter (Signed)
gv and printed appt sched and avs for pt for NOV and DEC....sed added tx. °

## 2012-12-14 NOTE — Progress Notes (Signed)
[facility] Telephone:([phone]   Fax:(336) [phone]  OFFICE PROGRESS NOTE  [name] [address] 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   PRIOR THERAPY: :  1. Status post 6 cycles of systemic chemotherapy with Revlimid and Decadron, last dose was given 07/21/2010 with very good response. 2. 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 Lance Bosch.  3. maintenance Revlimid at 10 mg by mouth daily status post 2 months. Therapy began 01/18/2011. 4. maintenance Revlimid at 15 mg by mouth daily with prophylactic dose Coumadin at 2 mg by mouth daily. 5. Systemic chemotherapy with Velcade at 1.3 mg per meter squared given on days 1, 4, 8 and 11 and Doxil at 30 mg per meter square given on day 4 and Decadron 40 mg by mouth on weekly basis given every 3 weeks. Status post 4 cycles. 6. Zometa 4 mg IV every 4 weeks.  7. Velcade 1.3 mg/M2 subcutaneous daily on a weekly basis with Decadron 20 mg by mouth on a weekly basis. First cycle expected on 06/21/2012. S/P 4 cycles.  CURRENT THERAPY:  1) Systemic chemotherapy with Carfilzomib, cyclophosphamide and Decadron, status post 1 cycle. First cycle started on 08/09/2012. He is status post 4 cycles and currently undergoing cycle #5   INTERVAL HISTORY: Eddie Thomas 67 y.o. male returns to the clinic today for followup visit. The patient is feeling fine with no specific complaints. He denied having any significant weight loss or night sweats. He has no fever or chills. He has no nausea or vomiting. The patient denied having any significant chest pain, shortness of breath, cough or hemoptysis. 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 He is here today to start day 15 of the first cycle  of his chemotherapy.  MEDICAL HISTORY: Past Medical History  Diagnosis Date  . Multiple myeloma   . Diabetes mellitus 07/03/2011  . Hypertension 07/03/2011  . Hyperlipidemia 07/03/2011  . Colon polyps 2012  . Gastric ulcer     ALLERGIES:  has No Known Allergies.  MEDICATIONS:  Current Outpatient Prescriptions  Medication Sig Dispense Refill  . ACCU-CHEK AVIVA PLUS test strip       . aspirin 81 MG tablet Take 81 mg by mouth daily.      [name] dexamethasone (DECADRON) 4 MG tablet 20 Milligram by mouth every week start with the first dose of chemotherapy  80 tablet  1  . ezetimibe (ZETIA) 10 MG tablet Take 10 mg by mouth daily.        . haemophilus B polysaccharide conjugate vaccine (ACTHIB) injection Inject 0.5 mLs into the muscle once.  1 each  0  . lisinopril (PRINIVIL,ZESTRIL) 10 MG tablet Take 5 mg by mouth daily.       . pantoprazole (PROTONIX) 40 MG tablet Take 1 tablet (40 mg total) by mouth 2 (two) times daily.  60 tablet  11  . pioglitazone (ACTOS) 15 MG tablet Take 15 mg by mouth daily.      . prochlorperazine (COMPAZINE) 10 MG tablet Take 1 tablet (10 mg total) by mouth every 6 (six) hours as needed.  30 tablet  0  . simvastatin (ZOCOR) 10 MG tablet Take 10 mg by mouth at bedtime.        . temazepam (RESTORIL) 15 MG  capsule Take 1 capsule (15 mg total) by mouth at bedtime as needed for sleep.  30 capsule  0  . valACYclovir (VALTREX) 500 MG tablet Take 1 tablet (500 mg total) by mouth daily.  30 tablet  3   No current facility-administered medications for this visit.    SURGICAL HISTORY:  Past Surgical History  Procedure Laterality Date  . Limbal stem cell transplant    . Humerus fracture surgery      right    REVIEW OF SYSTEMS:  A comprehensive review of systems was negative except for: Neurological: positive for paresthesia   PHYSICAL EXAMINATION: General appearance: alert, cooperative and no distress Head: Normocephalic, without obvious abnormality, atraumatic Neck:  no adenopathy, no JVD, supple, symmetrical, trachea midline and thyroid not enlarged, symmetric, no tenderness/mass/nodules Lymph nodes: Cervical, supraclavicular, and axillary nodes normal. Resp: clear to auscultation bilaterally Back: symmetric, no curvature. ROM normal. No CVA tenderness. Cardio: regular rate and rhythm, S1, S2 normal, no murmur, click, rub or gallop GI: soft, non-tender; bowel sounds normal; no masses,  no organomegaly Extremities: extremities normal, atraumatic, no cyanosis or edema  ECOG PERFORMANCE STATUS: 1 - Symptomatic but completely ambulatory  Blood pressure 156/74, pulse 64, temperature 98.8 F (37.1 C), temperature source Oral, resp. rate 18, height 6\' 1"  (1.854 m), weight 176 lb 12.8 oz (80.196 kg).  LABORATORY DATA: Lab Results  Component Value Date   WBC 6.8 12/14/2012   HGB 9.3* 12/14/2012   HCT 29.1* 12/14/2012   MCV 77.0* 12/14/2012   PLT 114* 12/14/2012      Chemistry      Component Value Date/Time   NA 140 12/08/2012 0804   NA 137 02/04/2012 2331   K 3.9 12/08/2012 0804   K 3.8 02/04/2012 2331   CL 103 07/12/2012 0907   CL 91* 02/04/2012 2331   CO2 23 12/08/2012 0804   CO2 34* 02/04/2012 2331   BUN 9.8 12/08/2012 0804   BUN 28* 02/04/2012 2331   CREATININE 0.9 12/08/2012 0804   CREATININE 1.10 02/04/2012 2331      Component Value Date/Time   CALCIUM 9.3 12/08/2012 0804   CALCIUM 9.5 02/04/2012 2331   ALKPHOS 43 12/08/2012 0804   ALKPHOS 71 02/04/2012 2331   AST 15 12/08/2012 0804   AST 18 02/04/2012 2331   ALT 12 12/08/2012 0804   ALT 16 02/04/2012 2331   BILITOT 1.26* 12/08/2012 0804   BILITOT 1.3* 02/04/2012 2331       RADIOGRAPHIC STUDIES: No results found.  ASSESSMENT AND PLAN: This is a very pleasant 67 years old [place] American male with history of multiple myeloma currently undergoing systemic chemotherapy with Carfilzomib, cyclophosphamide and Decadron status post 4 cycles and he is currently undergoing cycle #5 and  tolerating his treatment fairly well. The patient will have a week off next week. He would come back for followup visit in 2 weeks with the start of cycle #4.  He was advised to call immediately if he has any concerning symptoms in the interval.  The patient voices understanding of current disease status and treatment options and is in agreement with the current care plan.  All questions were answered. The patient knows to call the clinic with any problems, questions or concerns. We can certainly see the patient much sooner if necessary.

## 2012-12-15 ENCOUNTER — Ambulatory Visit (HOSPITAL_BASED_OUTPATIENT_CLINIC_OR_DEPARTMENT_OTHER): Payer: Medicare Other

## 2012-12-15 ENCOUNTER — Other Ambulatory Visit: Payer: Medicare Other

## 2012-12-15 VITALS — BP 149/75 | HR 79 | Temp 97.5°F | Resp 18

## 2012-12-15 DIAGNOSIS — Z5112 Encounter for antineoplastic immunotherapy: Secondary | ICD-10-CM

## 2012-12-15 DIAGNOSIS — C9000 Multiple myeloma not having achieved remission: Secondary | ICD-10-CM

## 2012-12-15 MED ORDER — ONDANSETRON 8 MG/NS 50 ML IVPB
INTRAVENOUS | Status: AC
  Filled 2012-12-15: qty 8

## 2012-12-15 MED ORDER — SODIUM CHLORIDE 0.9 % IV SOLN
Freq: Once | INTRAVENOUS | Status: AC
  Administered 2012-12-15: 11:00:00 via INTRAVENOUS

## 2012-12-15 MED ORDER — DEXAMETHASONE SODIUM PHOSPHATE 10 MG/ML IJ SOLN
10.0000 mg | Freq: Once | INTRAMUSCULAR | Status: AC
  Administered 2012-12-15: 10 mg via INTRAVENOUS

## 2012-12-15 MED ORDER — DEXAMETHASONE SODIUM PHOSPHATE 10 MG/ML IJ SOLN
INTRAMUSCULAR | Status: AC
  Filled 2012-12-15: qty 1

## 2012-12-15 MED ORDER — SODIUM CHLORIDE 0.9 % IV SOLN: Freq: Once | INTRAVENOUS | Status: DC

## 2012-12-15 MED ORDER — DEXTROSE 5 % IV SOLN
27.0000 mg/m2 | Freq: Once | INTRAVENOUS | Status: AC
  Administered 2012-12-15: 54 mg via INTRAVENOUS
  Filled 2012-12-15: qty 27

## 2012-12-15 MED ORDER — ONDANSETRON 8 MG/50ML IVPB (CHCC)
8.0000 mg | Freq: Once | INTRAVENOUS | Status: AC
  Administered 2012-12-15: 8 mg via INTRAVENOUS

## 2012-12-15 NOTE — Patient Instructions (Signed)
[facility] Discharge Instructions for Patients Receiving Chemotherapy  Today you received the following chemotherapy agents Kyprolis.  To help prevent nausea and vomiting after your treatment, we encourage you to take your nausea medication.   If you develop nausea and vomiting that is not controlled by your nausea medication, call the clinic.   BELOW ARE SYMPTOMS THAT SHOULD BE REPORTED IMMEDIATELY:  *FEVER GREATER THAN 100.5 F  *CHILLS WITH OR WITHOUT FEVER  NAUSEA AND VOMITING THAT IS NOT CONTROLLED WITH YOUR NAUSEA MEDICATION  *UNUSUAL SHORTNESS OF BREATH  *UNUSUAL BRUISING OR BLEEDING  TENDERNESS IN MOUTH AND THROAT WITH OR WITHOUT PRESENCE OF ULCERS  *URINARY PROBLEMS  *BOWEL PROBLEMS  UNUSUAL RASH Items with * indicate a potential emergency and should be followed up as soon as possible.  Feel free to call the clinic you have any questions or concerns. The clinic phone number is [phone].    

## 2012-12-17 ENCOUNTER — Ambulatory Visit: Payer: Medicare Other

## 2012-12-22 ENCOUNTER — Other Ambulatory Visit: Payer: Self-pay | Admitting: Medical Oncology

## 2012-12-22 ENCOUNTER — Other Ambulatory Visit (HOSPITAL_BASED_OUTPATIENT_CLINIC_OR_DEPARTMENT_OTHER): Payer: Medicare Other

## 2012-12-22 DIAGNOSIS — C9000 Multiple myeloma not having achieved remission: Secondary | ICD-10-CM

## 2012-12-22 LAB — COMPREHENSIVE METABOLIC PANEL (CC13)
ALT: 14 U/L (ref 0–55)
AST: 15 U/L (ref 5–34)
Anion Gap: 9 mEq/L (ref 3–11)
CO2: 24 mEq/L (ref 22–29)
Calcium: 9.2 mg/dL (ref 8.4–10.4)
Chloride: 105 mEq/L (ref 98–109)
Creatinine: 1 mg/dL (ref 0.7–1.3)
Potassium: 4.1 mEq/L (ref 3.5–5.1)
Sodium: 138 mEq/L (ref 136–145)
Total Protein: 6.2 g/dL — ABNORMAL LOW (ref 6.4–8.3)

## 2012-12-28 ENCOUNTER — Telehealth: Payer: Self-pay | Admitting: *Deleted

## 2012-12-28 ENCOUNTER — Ambulatory Visit (HOSPITAL_BASED_OUTPATIENT_CLINIC_OR_DEPARTMENT_OTHER): Payer: Medicare Other

## 2012-12-28 ENCOUNTER — Telehealth: Payer: Self-pay | Admitting: Internal Medicine

## 2012-12-28 ENCOUNTER — Encounter: Payer: Self-pay | Admitting: Physician Assistant

## 2012-12-28 ENCOUNTER — Other Ambulatory Visit (HOSPITAL_BASED_OUTPATIENT_CLINIC_OR_DEPARTMENT_OTHER): Payer: Medicare Other | Admitting: Lab

## 2012-12-28 ENCOUNTER — Ambulatory Visit (HOSPITAL_BASED_OUTPATIENT_CLINIC_OR_DEPARTMENT_OTHER): Payer: Medicare Other | Admitting: Physician Assistant

## 2012-12-28 VITALS — BP 134/73 | HR 62 | Temp 97.5°F | Resp 18 | Ht 73.0 in | Wt 178.0 lb

## 2012-12-28 DIAGNOSIS — C9000 Multiple myeloma not having achieved remission: Secondary | ICD-10-CM

## 2012-12-28 DIAGNOSIS — Z5112 Encounter for antineoplastic immunotherapy: Secondary | ICD-10-CM

## 2012-12-28 DIAGNOSIS — C9002 Multiple myeloma in relapse: Secondary | ICD-10-CM

## 2012-12-28 DIAGNOSIS — Z5111 Encounter for antineoplastic chemotherapy: Secondary | ICD-10-CM

## 2012-12-28 LAB — CBC WITH DIFFERENTIAL/PLATELET
BASO%: 0.2 % (ref 0.0–2.0)
Basophils Absolute: 0 10*3/uL (ref 0.0–0.1)
EOS%: 1.2 % (ref 0.0–7.0)
Eosinophils Absolute: 0.1 10*3/uL (ref 0.0–0.5)
HGB: 9.6 g/dL — ABNORMAL LOW (ref 13.0–17.1)
MCH: 24.5 pg — ABNORMAL LOW (ref 27.2–33.4)
MCHC: 31.7 g/dL — ABNORMAL LOW (ref 32.0–36.0)
MCV: 77.3 fL — ABNORMAL LOW (ref 79.3–98.0)
MONO%: 10.1 % (ref 0.0–14.0)
NEUT%: 75.3 % — ABNORMAL HIGH (ref 39.0–75.0)
Platelets: 198 10*3/uL (ref 140–400)
RDW: 14.5 % (ref 11.0–14.6)

## 2012-12-28 MED ORDER — SODIUM CHLORIDE 0.9 % IV SOLN
Freq: Once | INTRAVENOUS | Status: AC
  Administered 2012-12-28: 12:00:00 via INTRAVENOUS

## 2012-12-28 MED ORDER — CARFILZOMIB CHEMO INJECTION 60 MG
27.0000 mg/m2 | Freq: Once | INTRAVENOUS | Status: AC
  Administered 2012-12-28: 54 mg via INTRAVENOUS
  Filled 2012-12-28: qty 27

## 2012-12-28 MED ORDER — ONDANSETRON 8 MG/NS 50 ML IVPB
INTRAVENOUS | Status: AC
  Filled 2012-12-28: qty 8

## 2012-12-28 MED ORDER — DEXAMETHASONE 4 MG PO TABS: ORAL_TABLET | ORAL | Status: DC

## 2012-12-28 MED ORDER — SODIUM CHLORIDE 0.9 % IV SOLN
300.0000 mg/m2 | Freq: Once | INTRAVENOUS | Status: AC
  Administered 2012-12-28: 600 mg via INTRAVENOUS
  Filled 2012-12-28: qty 30

## 2012-12-28 MED ORDER — ONDANSETRON 8 MG/50ML IVPB (CHCC)
8.0000 mg | Freq: Once | INTRAVENOUS | Status: AC
  Administered 2012-12-28: 8 mg via INTRAVENOUS

## 2012-12-28 MED ORDER — DEXAMETHASONE SODIUM PHOSPHATE 10 MG/ML IJ SOLN
INTRAMUSCULAR | Status: AC
  Filled 2012-12-28: qty 1

## 2012-12-28 MED ORDER — DEXAMETHASONE SODIUM PHOSPHATE 10 MG/ML IJ SOLN
10.0000 mg | Freq: Once | INTRAMUSCULAR | Status: AC
  Administered 2012-12-28: 10 mg via INTRAVENOUS

## 2012-12-28 NOTE — Telephone Encounter (Signed)
Per staff message and POF I have scheduled appts.  JMW  

## 2012-12-28 NOTE — Patient Instructions (Signed)
[facility] Discharge Instructions for Patients Receiving Chemotherapy  Today you received the following chemotherapy agents: Kyprolis and Cytoxan   To help prevent nausea and vomiting after your treatment, we encourage you to take your nausea medication as prescribed.    If you develop nausea and vomiting that is not controlled by your nausea medication, call the clinic.   BELOW ARE SYMPTOMS THAT SHOULD BE REPORTED IMMEDIATELY:  *FEVER GREATER THAN 100.5 F  *CHILLS WITH OR WITHOUT FEVER  NAUSEA AND VOMITING THAT IS NOT CONTROLLED WITH YOUR NAUSEA MEDICATION  *UNUSUAL SHORTNESS OF BREATH  *UNUSUAL BRUISING OR BLEEDING  TENDERNESS IN MOUTH AND THROAT WITH OR WITHOUT PRESENCE OF ULCERS  *URINARY PROBLEMS  *BOWEL PROBLEMS  UNUSUAL RASH Items with * indicate a potential emergency and should be followed up as soon as possible.  Feel free to call the clinic you have any questions or concerns. The clinic phone number is [phone].    

## 2012-12-28 NOTE — Progress Notes (Addendum)
[facility] [phone]   [phone]  SHARED VISIT PROGRESS NOTE  Eddie Thomas [address] 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   PRIOR THERAPY: :  1. Status post 6 cycles of systemic chemotherapy with Revlimid and Decadron, last dose was given 07/21/2010 with very good response. 2. 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 Lance Bosch.  3. maintenance Revlimid at 10 mg by mouth daily status post 2 months. Therapy began 01/18/2011. 4. maintenance Revlimid at 15 mg by mouth daily with prophylactic dose Coumadin at 2 mg by mouth daily. 5. Systemic chemotherapy with Velcade at 1.3 mg per meter squared given on days 1, 4, 8 and 11 and Doxil at 30 mg per meter square given on day 4 and Decadron 40 mg by mouth on weekly basis given every 3 weeks. Status post 4 cycles. 6. Zometa 4 mg IV every 4 weeks.  7. Velcade 1.3 mg/M2 subcutaneous daily on a weekly basis with Decadron 20 mg by mouth on a weekly basis. First cycle expected on 06/21/2012. S/P 4 cycles.  CURRENT THERAPY:  1) Systemic chemotherapy with Carfilzomib, cyclophosphamide and Decadron, status post 1 cycle. First cycle started on 08/09/2012. He is status post 5 cycles    INTERVAL HISTORY: Eddie Thomas 67 y.o. male returns to the clinic today for followup visit. The patient is feeling fine with no specific complaints except some continued difficulty sleeping despite Restoril 15 mg. He denied having any significant weight loss or night sweats. He has no fever or chills. He has no nausea or vomiting. The patient denied having any significant chest pain, shortness of breath, cough or hemoptysis. 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 He is here  today to start day 1 of cycle #6 of his chemotherapy.  MEDICAL HISTORY: Past Medical History  Diagnosis Date  . Multiple myeloma   . Diabetes mellitus 07/03/2011  . Hypertension 07/03/2011  . Hyperlipidemia 07/03/2011  . Colon polyps 2012  . Gastric ulcer     ALLERGIES:  has No Known Allergies.  MEDICATIONS:  Current Outpatient Prescriptions  Medication Sig Dispense Refill  . ACCU-CHEK AVIVA PLUS test strip       . aspirin 81 MG tablet Take 81 mg by mouth daily.      [name] dexamethasone (DECADRON) 4 MG tablet 20 Milligram by mouth every week start with the first dose of chemotherapy  80 tablet  2  . ezetimibe (ZETIA) 10 MG tablet Take 10 mg by mouth daily.        [name] lisinopril (PRINIVIL,ZESTRIL) 10 MG tablet Take 5 mg by mouth daily.       . pantoprazole (PROTONIX) 40 MG tablet Take 1 tablet (40 mg total) by mouth 2 (two) times daily.  60 tablet  11  . pioglitazone (ACTOS) 15 MG tablet Take 15 mg by mouth daily.      . simvastatin (ZOCOR) 10 MG tablet Take 10 mg by mouth at bedtime.        . valACYclovir (VALTREX) 500 MG tablet Take 1 tablet (500 mg total) by mouth daily.  30 tablet  3  . haemophilus B polysaccharide conjugate vaccine (ACTHIB) injection Inject 0.5 mLs into the muscle once.  1 each  0  . prochlorperazine (COMPAZINE) 10 MG  tablet Take 1 tablet (10 mg total) by mouth every 6 (six) hours as needed.  30 tablet  0  . temazepam (RESTORIL) 15 MG capsule Take 1 capsule (15 mg total) by mouth at bedtime as needed for sleep.  30 capsule  0   No current facility-administered medications for this visit.    SURGICAL HISTORY:  Past Surgical History  Procedure Laterality Date  . Limbal stem cell transplant    . Humerus fracture surgery      right    REVIEW OF SYSTEMS:  A comprehensive review of systems was negative except for: Neurological: positive for paresthesia   PHYSICAL EXAMINATION: General appearance: alert, cooperative and no distress Head: Normocephalic, without obvious  abnormality, atraumatic Neck: no adenopathy, no JVD, supple, symmetrical, trachea midline and thyroid not enlarged, symmetric, no tenderness/mass/nodules Lymph nodes: Cervical, supraclavicular, and axillary nodes normal. Resp: clear to auscultation bilaterally Back: symmetric, no curvature. ROM normal. No CVA tenderness. Cardio: regular rate and rhythm, S1, S2 normal, no murmur, click, rub or gallop GI: soft, non-tender; bowel sounds normal; no masses,  no organomegaly Extremities: extremities normal, atraumatic, no cyanosis or edema  ECOG PERFORMANCE STATUS: 1 - Symptomatic but completely ambulatory  Blood pressure 134/73, pulse 62, temperature 97.5 F (36.4 C), temperature source Oral, resp. rate 18, height 6\' 1"  (1.854 m), weight 178 lb (80.74 kg), SpO2 100.00%.  LABORATORY DATA: Lab Results  Component Value Date   WBC 4.2 12/28/2012   HGB 9.6* 12/28/2012   HCT 30.3* 12/28/2012   MCV 77.3* 12/28/2012   PLT 198 12/28/2012      Chemistry      Component Value Date/Time   NA 138 12/22/2012 1022   NA 137 02/04/2012 2331   K 4.1 12/22/2012 1022   K 3.8 02/04/2012 2331   CL 103 07/12/2012 0907   CL 91* 02/04/2012 2331   CO2 24 12/22/2012 1022   CO2 34* 02/04/2012 2331   BUN 10.7 12/22/2012 1022   BUN 28* 02/04/2012 2331   CREATININE 1.0 12/22/2012 1022   CREATININE 1.10 02/04/2012 2331      Component Value Date/Time   CALCIUM 9.2 12/22/2012 1022   CALCIUM 9.5 02/04/2012 2331   ALKPHOS 51 12/22/2012 1022   ALKPHOS 71 02/04/2012 2331   AST 15 12/22/2012 1022   AST 18 02/04/2012 2331   ALT 14 12/22/2012 1022   ALT 16 02/04/2012 2331   BILITOT 1.03 12/22/2012 1022   BILITOT 1.3* 02/04/2012 2331       RADIOGRAPHIC STUDIES: No results found.  ASSESSMENT AND PLAN: This is a very pleasant 67 years old [place] American male with history of multiple myeloma currently undergoing systemic chemotherapy with Carfilzomib, cyclophosphamide and Decadron status post 5 cycles and tolerating his treatment  fairly well. Patient was discussed with him also seen by Dr. Arbutus Ped. He will proceed with cycle #6 of his chemotherapy today as scheduled. For insomnia patient is to increase his Restoril to 30 mg by mouth at bedtime. He will followup with Dr. Arbutus Ped in 4 weeks with repeat myeloma panel.   Laural Benes, Delmas Faucett E, PA-C    He was advised to call immediately if he has any concerning symptoms in the interval.  The patient voices understanding of current disease status and treatment options and is in agreement with the current care plan.  All questions were answered. The patient knows to call the clinic with any problems, questions or concerns. We can certainly see the patient much sooner if necessary.  ADDENDUM: Hematology/Oncology  Attending: I had a face to face encounter with the patient. I recommended his care plan. This is a very pleasant 67 years old [place] American male with recurrent multiple myeloma currently undergoing systemic chemotherapy with Carfilzomib, Cytoxan and Decadron status post 5 cycles. We'll proceed with cycle #6 today as scheduled. He would come back for follow up visit in 4 weeks for reevaluation after repeating myeloma panel. He was advised to call immediately if he has any concerning symptoms in the interval. Lajuana Matte., MD 12/28/2012

## 2012-12-28 NOTE — Patient Instructions (Signed)
Increase your Restoril to two15 milligram tablets at bedtime until your current supplies exhausted for total of 30 milligrams Follow with Dr. Arbutus Ped in one month with repeat protein studies to reevaluate her disease

## 2012-12-28 NOTE — Telephone Encounter (Signed)
Gave pt appt for MD, emailed Marcelino Duster regarding chemo, labs will be scheduled later for January appt

## 2012-12-29 ENCOUNTER — Telehealth: Payer: Self-pay | Admitting: Internal Medicine

## 2012-12-29 ENCOUNTER — Ambulatory Visit (HOSPITAL_BASED_OUTPATIENT_CLINIC_OR_DEPARTMENT_OTHER): Payer: Medicare Other

## 2012-12-29 VITALS — BP 132/63 | HR 63 | Temp 97.6°F | Resp 20

## 2012-12-29 DIAGNOSIS — C9002 Multiple myeloma in relapse: Secondary | ICD-10-CM

## 2012-12-29 DIAGNOSIS — Z5112 Encounter for antineoplastic immunotherapy: Secondary | ICD-10-CM

## 2012-12-29 DIAGNOSIS — C9000 Multiple myeloma not having achieved remission: Secondary | ICD-10-CM

## 2012-12-29 MED ORDER — SODIUM CHLORIDE 0.9 % IV SOLN
Freq: Once | INTRAVENOUS | Status: AC
  Administered 2012-12-29: 15:00:00 via INTRAVENOUS

## 2012-12-29 MED ORDER — DEXAMETHASONE SODIUM PHOSPHATE 10 MG/ML IJ SOLN
INTRAMUSCULAR | Status: AC
  Filled 2012-12-29: qty 1

## 2012-12-29 MED ORDER — ONDANSETRON 8 MG/NS 50 ML IVPB
INTRAVENOUS | Status: AC
  Filled 2012-12-29: qty 8

## 2012-12-29 MED ORDER — SODIUM CHLORIDE 0.9 % IV SOLN
Freq: Once | INTRAVENOUS | Status: AC
  Administered 2012-12-29: 14:00:00 via INTRAVENOUS

## 2012-12-29 MED ORDER — DEXAMETHASONE SODIUM PHOSPHATE 10 MG/ML IJ SOLN
10.0000 mg | Freq: Once | INTRAMUSCULAR | Status: AC
  Administered 2012-12-29: 10 mg via INTRAVENOUS

## 2012-12-29 MED ORDER — DEXTROSE 5 % IV SOLN
27.0000 mg/m2 | Freq: Once | INTRAVENOUS | Status: AC
  Administered 2012-12-29: 54 mg via INTRAVENOUS
  Filled 2012-12-29: qty 27

## 2012-12-29 MED ORDER — ONDANSETRON 8 MG/50ML IVPB (CHCC)
8.0000 mg | Freq: Once | INTRAVENOUS | Status: AC
  Administered 2012-12-29: 8 mg via INTRAVENOUS

## 2012-12-29 NOTE — Telephone Encounter (Signed)
Called pt appt for lab ,chemo and MD for December and Janaury 2015

## 2012-12-29 NOTE — Patient Instructions (Signed)
[facility] Discharge Instructions for Patients Receiving Chemotherapy  Today you received the following chemotherapy agents kyprolis  To help prevent nausea and vomiting after your treatment, we encourage you to take your nausea medication as needed   If you develop nausea and vomiting that is not controlled by your nausea medication, call the clinic.   BELOW ARE SYMPTOMS THAT SHOULD BE REPORTED IMMEDIATELY:  *FEVER GREATER THAN 100.5 F  *CHILLS WITH OR WITHOUT FEVER  NAUSEA AND VOMITING THAT IS NOT CONTROLLED WITH YOUR NAUSEA MEDICATION  *UNUSUAL SHORTNESS OF BREATH  *UNUSUAL BRUISING OR BLEEDING  TENDERNESS IN MOUTH AND THROAT WITH OR WITHOUT PRESENCE OF ULCERS  *URINARY PROBLEMS  *BOWEL PROBLEMS  UNUSUAL RASH Items with * indicate a potential emergency and should be followed up as soon as possible.  Feel free to call the clinic you have any questions or concerns. The clinic phone number is [phone].    

## 2013-01-04 ENCOUNTER — Ambulatory Visit (HOSPITAL_BASED_OUTPATIENT_CLINIC_OR_DEPARTMENT_OTHER): Payer: Medicare Other

## 2013-01-04 ENCOUNTER — Other Ambulatory Visit: Payer: Self-pay | Admitting: *Deleted

## 2013-01-04 ENCOUNTER — Other Ambulatory Visit (HOSPITAL_BASED_OUTPATIENT_CLINIC_OR_DEPARTMENT_OTHER): Payer: Medicare Other

## 2013-01-04 VITALS — BP 122/62 | HR 60 | Temp 97.9°F | Resp 20

## 2013-01-04 DIAGNOSIS — C9002 Multiple myeloma in relapse: Secondary | ICD-10-CM

## 2013-01-04 DIAGNOSIS — Z5112 Encounter for antineoplastic immunotherapy: Secondary | ICD-10-CM

## 2013-01-04 DIAGNOSIS — C9000 Multiple myeloma not having achieved remission: Secondary | ICD-10-CM

## 2013-01-04 LAB — COMPREHENSIVE METABOLIC PANEL (CC13)
AST: 21 U/L (ref 5–34)
Albumin: 3.5 g/dL (ref 3.5–5.0)
Anion Gap: 9 mEq/L (ref 3–11)
BUN: 11.6 mg/dL (ref 7.0–26.0)
CO2: 25 mEq/L (ref 22–29)
Calcium: 8.8 mg/dL (ref 8.4–10.4)
Chloride: 105 mEq/L (ref 98–109)
Glucose: 142 mg/dl — ABNORMAL HIGH (ref 70–140)
Potassium: 4.2 mEq/L (ref 3.5–5.1)
Total Bilirubin: 1.27 mg/dL — ABNORMAL HIGH (ref 0.20–1.20)

## 2013-01-04 LAB — CBC WITH DIFFERENTIAL/PLATELET
BASO%: 0 % (ref 0.0–2.0)
Basophils Absolute: 0 10*3/uL (ref 0.0–0.1)
Eosinophils Absolute: 0.1 10*3/uL (ref 0.0–0.5)
HCT: 30 % — ABNORMAL LOW (ref 38.4–49.9)
HGB: 9.5 g/dL — ABNORMAL LOW (ref 13.0–17.1)
LYMPH%: 8.5 % — ABNORMAL LOW (ref 14.0–49.0)
MCH: 24.5 pg — ABNORMAL LOW (ref 27.2–33.4)
MCV: 77.5 fL — ABNORMAL LOW (ref 79.3–98.0)
MONO#: 0.6 10*3/uL (ref 0.1–0.9)
MONO%: 11.1 % (ref 0.0–14.0)
NEUT%: 79.3 % — ABNORMAL HIGH (ref 39.0–75.0)
Platelets: 101 10*3/uL — ABNORMAL LOW (ref 140–400)
RBC: 3.87 10*6/uL — ABNORMAL LOW (ref 4.20–5.82)
WBC: 5.5 10*3/uL (ref 4.0–10.3)
lymph#: 0.5 10*3/uL — ABNORMAL LOW (ref 0.9–3.3)
nRBC: 0 % (ref 0–0)

## 2013-01-04 MED ORDER — ONDANSETRON 8 MG/NS 50 ML IVPB
INTRAVENOUS | Status: AC
  Filled 2013-01-04: qty 8

## 2013-01-04 MED ORDER — DEXTROSE 5 % IV SOLN
27.0000 mg/m2 | Freq: Once | INTRAVENOUS | Status: AC
  Administered 2013-01-04: 54 mg via INTRAVENOUS
  Filled 2013-01-04: qty 27

## 2013-01-04 MED ORDER — SODIUM CHLORIDE 0.9 % IV SOLN
300.0000 mg/m2 | Freq: Once | INTRAVENOUS | Status: AC
  Administered 2013-01-04: 600 mg via INTRAVENOUS
  Filled 2013-01-04: qty 30

## 2013-01-04 MED ORDER — SODIUM CHLORIDE 0.9 % IV SOLN
Freq: Once | INTRAVENOUS | Status: AC
  Administered 2013-01-04: 11:00:00 via INTRAVENOUS

## 2013-01-04 MED ORDER — DEXAMETHASONE SODIUM PHOSPHATE 10 MG/ML IJ SOLN
INTRAMUSCULAR | Status: AC
  Filled 2013-01-04: qty 1

## 2013-01-04 MED ORDER — ONDANSETRON 8 MG/50ML IVPB (CHCC)
8.0000 mg | Freq: Once | INTRAVENOUS | Status: AC
  Administered 2013-01-04: 8 mg via INTRAVENOUS

## 2013-01-04 MED ORDER — DEXAMETHASONE SODIUM PHOSPHATE 10 MG/ML IJ SOLN
10.0000 mg | Freq: Once | INTRAMUSCULAR | Status: AC
  Administered 2013-01-04: 10 mg via INTRAVENOUS

## 2013-01-04 NOTE — Patient Instructions (Signed)
[facility] Discharge Instructions for Patients Receiving Chemotherapy  Today you received the following chemotherapy agents Cytoxan and Kyprolis.  To help prevent nausea and vomiting after your treatment, we encourage you to take your nausea medication as prescribed.   If you develop nausea and vomiting that is not controlled by your nausea medication, call the clinic.   BELOW ARE SYMPTOMS THAT SHOULD BE REPORTED IMMEDIATELY:  *FEVER GREATER THAN 100.5 F  *CHILLS WITH OR WITHOUT FEVER  NAUSEA AND VOMITING THAT IS NOT CONTROLLED WITH YOUR NAUSEA MEDICATION  *UNUSUAL SHORTNESS OF BREATH  *UNUSUAL BRUISING OR BLEEDING  TENDERNESS IN MOUTH AND THROAT WITH OR WITHOUT PRESENCE OF ULCERS  *URINARY PROBLEMS  *BOWEL PROBLEMS  UNUSUAL RASH Items with * indicate a potential emergency and should be followed up as soon as possible.  Feel free to call the clinic you have any questions or concerns. The clinic phone number is [phone].    

## 2013-01-05 ENCOUNTER — Ambulatory Visit (HOSPITAL_BASED_OUTPATIENT_CLINIC_OR_DEPARTMENT_OTHER): Payer: Medicare Other

## 2013-01-05 VITALS — BP 167/77 | HR 60 | Temp 97.9°F | Resp 20

## 2013-01-05 DIAGNOSIS — C9000 Multiple myeloma not having achieved remission: Secondary | ICD-10-CM

## 2013-01-05 DIAGNOSIS — Z5112 Encounter for antineoplastic immunotherapy: Secondary | ICD-10-CM

## 2013-01-05 MED ORDER — DEXAMETHASONE SODIUM PHOSPHATE 10 MG/ML IJ SOLN
INTRAMUSCULAR | Status: AC
  Filled 2013-01-05: qty 1

## 2013-01-05 MED ORDER — ONDANSETRON 8 MG/50ML IVPB (CHCC)
8.0000 mg | Freq: Once | INTRAVENOUS | Status: AC
  Administered 2013-01-05: 8 mg via INTRAVENOUS

## 2013-01-05 MED ORDER — DEXTROSE 5 % IV SOLN
27.0000 mg/m2 | Freq: Once | INTRAVENOUS | Status: AC
  Administered 2013-01-05: 54 mg via INTRAVENOUS
  Filled 2013-01-05: qty 27

## 2013-01-05 MED ORDER — ONDANSETRON 8 MG/NS 50 ML IVPB
INTRAVENOUS | Status: AC
  Filled 2013-01-05: qty 8

## 2013-01-05 MED ORDER — SODIUM CHLORIDE 0.9 % IV SOLN
Freq: Once | INTRAVENOUS | Status: AC
  Administered 2013-01-05: 16:00:00 via INTRAVENOUS

## 2013-01-05 MED ORDER — DEXAMETHASONE SODIUM PHOSPHATE 10 MG/ML IJ SOLN
10.0000 mg | Freq: Once | INTRAMUSCULAR | Status: AC
  Administered 2013-01-05: 10 mg via INTRAVENOUS

## 2013-01-05 MED ORDER — SODIUM CHLORIDE 0.9 % IV SOLN
Freq: Once | INTRAVENOUS | Status: AC
  Administered 2013-01-05: 15:00:00 via INTRAVENOUS

## 2013-01-05 NOTE — Patient Instructions (Signed)
[facility] Discharge Instructions for Patients Receiving Chemotherapy  Today you received the following chemotherapy agents :  Kyprolis.  To help prevent nausea and vomiting after your treatment, we encourage you to take your nausea medication as instructed by your physician.   If you develop nausea and vomiting that is not controlled by your nausea medication, call the clinic.   BELOW ARE SYMPTOMS THAT SHOULD BE REPORTED IMMEDIATELY:  *FEVER GREATER THAN 100.5 F  *CHILLS WITH OR WITHOUT FEVER  NAUSEA AND VOMITING THAT IS NOT CONTROLLED WITH YOUR NAUSEA MEDICATION  *UNUSUAL SHORTNESS OF BREATH  *UNUSUAL BRUISING OR BLEEDING  TENDERNESS IN MOUTH AND THROAT WITH OR WITHOUT PRESENCE OF ULCERS  *URINARY PROBLEMS  *BOWEL PROBLEMS  UNUSUAL RASH Items with * indicate a potential emergency and should be followed up as soon as possible.  Feel free to call the clinic you have any questions or concerns. The clinic phone number is [phone].    

## 2013-01-10 ENCOUNTER — Other Ambulatory Visit: Payer: Self-pay | Admitting: *Deleted

## 2013-01-10 DIAGNOSIS — C9000 Multiple myeloma not having achieved remission: Secondary | ICD-10-CM

## 2013-01-11 ENCOUNTER — Ambulatory Visit (HOSPITAL_BASED_OUTPATIENT_CLINIC_OR_DEPARTMENT_OTHER): Payer: Medicare Other

## 2013-01-11 ENCOUNTER — Other Ambulatory Visit (HOSPITAL_BASED_OUTPATIENT_CLINIC_OR_DEPARTMENT_OTHER): Payer: Medicare Other

## 2013-01-11 VITALS — BP 137/74 | HR 61 | Temp 97.8°F

## 2013-01-11 DIAGNOSIS — C9002 Multiple myeloma in relapse: Secondary | ICD-10-CM

## 2013-01-11 DIAGNOSIS — Z5111 Encounter for antineoplastic chemotherapy: Secondary | ICD-10-CM

## 2013-01-11 DIAGNOSIS — Z5112 Encounter for antineoplastic immunotherapy: Secondary | ICD-10-CM

## 2013-01-11 DIAGNOSIS — C9000 Multiple myeloma not having achieved remission: Secondary | ICD-10-CM

## 2013-01-11 LAB — CBC WITH DIFFERENTIAL/PLATELET
Basophils Absolute: 0 10*3/uL (ref 0.0–0.1)
EOS%: 1.9 % (ref 0.0–7.0)
HGB: 9.5 g/dL — ABNORMAL LOW (ref 13.0–17.1)
MCH: 24.6 pg — ABNORMAL LOW (ref 27.2–33.4)
MCV: 76.4 fL — ABNORMAL LOW (ref 79.3–98.0)
MONO#: 0.4 10*3/uL (ref 0.1–0.9)
MONO%: 9.3 % (ref 0.0–14.0)
NEUT#: 3 10*3/uL (ref 1.5–6.5)
RBC: 3.86 10*6/uL — ABNORMAL LOW (ref 4.20–5.82)
RDW: 14.4 % (ref 11.0–14.6)
WBC: 4.2 10*3/uL (ref 4.0–10.3)
lymph#: 0.7 10*3/uL — ABNORMAL LOW (ref 0.9–3.3)
nRBC: 0 % (ref 0–0)

## 2013-01-11 LAB — COMPREHENSIVE METABOLIC PANEL (CC13)
AST: 14 U/L (ref 5–34)
Albumin: 3.2 g/dL — ABNORMAL LOW (ref 3.5–5.0)
Alkaline Phosphatase: 46 U/L (ref 40–150)
Glucose: 321 mg/dl — ABNORMAL HIGH (ref 70–140)
Potassium: 4.5 mEq/L (ref 3.5–5.1)
Sodium: 136 mEq/L (ref 136–145)
Total Protein: 5.7 g/dL — ABNORMAL LOW (ref 6.4–8.3)

## 2013-01-11 MED ORDER — DEXAMETHASONE SODIUM PHOSPHATE 10 MG/ML IJ SOLN
10.0000 mg | Freq: Once | INTRAMUSCULAR | Status: AC
  Administered 2013-01-11: 10 mg via INTRAVENOUS

## 2013-01-11 MED ORDER — SODIUM CHLORIDE 0.9 % IV SOLN
Freq: Once | INTRAVENOUS | Status: AC
  Administered 2013-01-11: 11:00:00 via INTRAVENOUS

## 2013-01-11 MED ORDER — DEXTROSE 5 % IV SOLN
27.0000 mg/m2 | Freq: Once | INTRAVENOUS | Status: AC
  Administered 2013-01-11: 54 mg via INTRAVENOUS
  Filled 2013-01-11: qty 27

## 2013-01-11 MED ORDER — ONDANSETRON 8 MG/50ML IVPB (CHCC)
8.0000 mg | Freq: Once | INTRAVENOUS | Status: AC
  Administered 2013-01-11: 8 mg via INTRAVENOUS

## 2013-01-11 MED ORDER — ONDANSETRON 8 MG/NS 50 ML IVPB
INTRAVENOUS | Status: AC
  Filled 2013-01-11: qty 8

## 2013-01-11 MED ORDER — SODIUM CHLORIDE 0.9 % IV SOLN
300.0000 mg/m2 | Freq: Once | INTRAVENOUS | Status: AC
  Administered 2013-01-11: 600 mg via INTRAVENOUS
  Filled 2013-01-11: qty 30

## 2013-01-11 MED ORDER — DEXAMETHASONE SODIUM PHOSPHATE 10 MG/ML IJ SOLN
INTRAMUSCULAR | Status: AC
  Filled 2013-01-11: qty 1

## 2013-01-11 MED ORDER — SODIUM CHLORIDE 0.9 % IV SOLN: Freq: Once | INTRAVENOUS | Status: DC

## 2013-01-11 NOTE — Patient Instructions (Signed)
[facility] Discharge Instructions for Patients Receiving Chemotherapy  Today you received the following chemotherapy agents Kyprolis/Cytoxan.  To help prevent nausea and vomiting after your treatment, we encourage you to take your nausea medication as prescribed.   If you develop nausea and vomiting that is not controlled by your nausea medication, call the clinic.   BELOW ARE SYMPTOMS THAT SHOULD BE REPORTED IMMEDIATELY:  *FEVER GREATER THAN 100.5 F  *CHILLS WITH OR WITHOUT FEVER  NAUSEA AND VOMITING THAT IS NOT CONTROLLED WITH YOUR NAUSEA MEDICATION  *UNUSUAL SHORTNESS OF BREATH  *UNUSUAL BRUISING OR BLEEDING  TENDERNESS IN MOUTH AND THROAT WITH OR WITHOUT PRESENCE OF ULCERS  *URINARY PROBLEMS  *BOWEL PROBLEMS  UNUSUAL RASH Items with * indicate a potential emergency and should be followed up as soon as possible.  Feel free to call the clinic you have any questions or concerns. The clinic phone number is [phone].    

## 2013-01-12 ENCOUNTER — Ambulatory Visit (HOSPITAL_BASED_OUTPATIENT_CLINIC_OR_DEPARTMENT_OTHER): Payer: Medicare Other

## 2013-01-12 VITALS — BP 120/71 | HR 74 | Temp 98.5°F | Resp 18

## 2013-01-12 DIAGNOSIS — C9000 Multiple myeloma not having achieved remission: Secondary | ICD-10-CM

## 2013-01-12 DIAGNOSIS — Z5112 Encounter for antineoplastic immunotherapy: Secondary | ICD-10-CM

## 2013-01-12 DIAGNOSIS — C9002 Multiple myeloma in relapse: Secondary | ICD-10-CM

## 2013-01-12 MED ORDER — DEXAMETHASONE SODIUM PHOSPHATE 10 MG/ML IJ SOLN
INTRAMUSCULAR | Status: AC
  Filled 2013-01-12: qty 1

## 2013-01-12 MED ORDER — DEXTROSE 5 % IV SOLN
27.0000 mg/m2 | Freq: Once | INTRAVENOUS | Status: AC
  Administered 2013-01-12: 54 mg via INTRAVENOUS
  Filled 2013-01-12: qty 27

## 2013-01-12 MED ORDER — ONDANSETRON 8 MG/50ML IVPB (CHCC)
8.0000 mg | Freq: Once | INTRAVENOUS | Status: AC
  Administered 2013-01-12: 8 mg via INTRAVENOUS

## 2013-01-12 MED ORDER — SODIUM CHLORIDE 0.9 % IV SOLN
Freq: Once | INTRAVENOUS | Status: AC
  Administered 2013-01-12: 11:00:00 via INTRAVENOUS

## 2013-01-12 MED ORDER — ONDANSETRON 8 MG/NS 50 ML IVPB
INTRAVENOUS | Status: AC
  Filled 2013-01-12: qty 8

## 2013-01-12 MED ORDER — DEXAMETHASONE SODIUM PHOSPHATE 10 MG/ML IJ SOLN
10.0000 mg | Freq: Once | INTRAMUSCULAR | Status: AC
  Administered 2013-01-12: 10 mg via INTRAVENOUS

## 2013-01-12 NOTE — Patient Instructions (Signed)
[facility] Discharge Instructions for Patients Receiving Chemotherapy  Today you received the following chemotherapy agents Kyprolis To help prevent nausea and vomiting after your treatment, we encourage you to take your nausea medication as prescribed.  If you develop nausea and vomiting that is not controlled by your nausea medication, call the clinic.   BELOW ARE SYMPTOMS THAT SHOULD BE REPORTED IMMEDIATELY:  *FEVER GREATER THAN 100.5 F  *CHILLS WITH OR WITHOUT FEVER  NAUSEA AND VOMITING THAT IS NOT CONTROLLED WITH YOUR NAUSEA MEDICATION  *UNUSUAL SHORTNESS OF BREATH  *UNUSUAL BRUISING OR BLEEDING  TENDERNESS IN MOUTH AND THROAT WITH OR WITHOUT PRESENCE OF ULCERS  *URINARY PROBLEMS  *BOWEL PROBLEMS  UNUSUAL RASH Items with * indicate a potential emergency and should be followed up as soon as possible.  Feel free to call the clinic you have any questions or concerns. The clinic phone number is [phone].    

## 2013-01-18 ENCOUNTER — Other Ambulatory Visit (HOSPITAL_BASED_OUTPATIENT_CLINIC_OR_DEPARTMENT_OTHER): Payer: Medicare Other

## 2013-01-18 DIAGNOSIS — C9002 Multiple myeloma in relapse: Secondary | ICD-10-CM

## 2013-01-18 DIAGNOSIS — C9000 Multiple myeloma not having achieved remission: Secondary | ICD-10-CM

## 2013-01-18 LAB — CBC WITH DIFFERENTIAL/PLATELET
BASO%: 0.1 % (ref 0.0–2.0)
Basophils Absolute: 0 10*3/uL (ref 0.0–0.1)
HCT: 31.7 % — ABNORMAL LOW (ref 38.4–49.9)
HGB: 10.1 g/dL — ABNORMAL LOW (ref 13.0–17.1)
LYMPH%: 6.9 % — ABNORMAL LOW (ref 14.0–49.0)
MCH: 25.1 pg — ABNORMAL LOW (ref 27.2–33.4)
MCHC: 32 g/dL (ref 32.0–36.0)
MCV: 78.6 fL — ABNORMAL LOW (ref 79.3–98.0)
MONO#: 0.1 10*3/uL (ref 0.1–0.9)
NEUT%: 92.1 % — ABNORMAL HIGH (ref 39.0–75.0)
Platelets: 153 10*3/uL (ref 140–400)
WBC: 6.3 10*3/uL (ref 4.0–10.3)
lymph#: 0.4 10*3/uL — ABNORMAL LOW (ref 0.9–3.3)

## 2013-01-18 LAB — COMPREHENSIVE METABOLIC PANEL (CC13)
ALT: 16 U/L (ref 0–55)
Anion Gap: 13 mEq/L — ABNORMAL HIGH (ref 3–11)
BUN: 13.9 mg/dL (ref 7.0–26.0)
CO2: 20 mEq/L — ABNORMAL LOW (ref 22–29)
Calcium: 9.2 mg/dL (ref 8.4–10.4)
Chloride: 105 mEq/L (ref 98–109)
Creatinine: 1 mg/dL (ref 0.7–1.3)
Glucose: 302 mg/dl — ABNORMAL HIGH (ref 70–140)
Sodium: 137 mEq/L (ref 136–145)
Total Bilirubin: 1.5 mg/dL — ABNORMAL HIGH (ref 0.20–1.20)

## 2013-01-18 LAB — LACTATE DEHYDROGENASE (CC13): LDH: 215 U/L (ref 125–245)

## 2013-01-19 LAB — KAPPA/LAMBDA LIGHT CHAINS
Kappa free light chain: 0.03 mg/dL — ABNORMAL LOW (ref 0.33–1.94)
Kappa:Lambda Ratio: 0.01 — ABNORMAL LOW (ref 0.26–1.65)

## 2013-01-19 LAB — BETA 2 MICROGLOBULIN, SERUM: Beta-2 Microglobulin: 1.46 mg/L (ref 1.01–1.73)

## 2013-01-19 LAB — IGG, IGA, IGM
IgA: 444 mg/dL — ABNORMAL HIGH (ref 68–379)
IgG (Immunoglobin G), Serum: 226 mg/dL — ABNORMAL LOW (ref 650–1600)
IgM, Serum: 5 mg/dL — ABNORMAL LOW (ref 41–251)

## 2013-01-20 DIAGNOSIS — Z5189 Encounter for other specified aftercare: Secondary | ICD-10-CM

## 2013-01-20 HISTORY — DX: Encounter for other specified aftercare: Z51.89

## 2013-01-25 ENCOUNTER — Other Ambulatory Visit (HOSPITAL_BASED_OUTPATIENT_CLINIC_OR_DEPARTMENT_OTHER): Payer: Medicare Other | Admitting: Internal Medicine

## 2013-01-25 ENCOUNTER — Ambulatory Visit (HOSPITAL_BASED_OUTPATIENT_CLINIC_OR_DEPARTMENT_OTHER): Payer: Medicare Other | Admitting: Internal Medicine

## 2013-01-25 ENCOUNTER — Ambulatory Visit (HOSPITAL_BASED_OUTPATIENT_CLINIC_OR_DEPARTMENT_OTHER): Payer: Medicare Other

## 2013-01-25 ENCOUNTER — Other Ambulatory Visit: Payer: Self-pay | Admitting: Internal Medicine

## 2013-01-25 ENCOUNTER — Other Ambulatory Visit (HOSPITAL_BASED_OUTPATIENT_CLINIC_OR_DEPARTMENT_OTHER): Payer: Medicare Other

## 2013-01-25 ENCOUNTER — Encounter: Payer: Self-pay | Admitting: Internal Medicine

## 2013-01-25 ENCOUNTER — Telehealth: Payer: Self-pay | Admitting: Internal Medicine

## 2013-01-25 VITALS — BP 147/72 | HR 60 | Temp 97.6°F | Resp 18 | Ht 73.0 in | Wt 177.5 lb

## 2013-01-25 DIAGNOSIS — C9002 Multiple myeloma in relapse: Secondary | ICD-10-CM

## 2013-01-25 DIAGNOSIS — Z9484 Stem cells transplant status: Secondary | ICD-10-CM

## 2013-01-25 DIAGNOSIS — C9000 Multiple myeloma not having achieved remission: Secondary | ICD-10-CM

## 2013-01-25 DIAGNOSIS — Z5111 Encounter for antineoplastic chemotherapy: Secondary | ICD-10-CM

## 2013-01-25 DIAGNOSIS — Z5112 Encounter for antineoplastic immunotherapy: Secondary | ICD-10-CM

## 2013-01-25 LAB — COMPREHENSIVE METABOLIC PANEL (CC13)
ALT: 17 U/L (ref 0–55)
ANION GAP: 9 meq/L (ref 3–11)
AST: 14 U/L (ref 5–34)
Albumin: 4.1 g/dL (ref 3.5–5.0)
Alkaline Phosphatase: 46 U/L (ref 40–150)
BUN: 11.7 mg/dL (ref 7.0–26.0)
CO2: 26 meq/L (ref 22–29)
CREATININE: 0.9 mg/dL (ref 0.7–1.3)
Calcium: 9.5 mg/dL (ref 8.4–10.4)
Chloride: 104 mEq/L (ref 98–109)
GLUCOSE: 154 mg/dL — AB (ref 70–140)
Potassium: 4.1 mEq/L (ref 3.5–5.1)
Sodium: 139 mEq/L (ref 136–145)
Total Bilirubin: 1.6 mg/dL — ABNORMAL HIGH (ref 0.20–1.20)
Total Protein: 6.9 g/dL (ref 6.4–8.3)

## 2013-01-25 LAB — CBC WITH DIFFERENTIAL/PLATELET
BASO%: 0.2 % (ref 0.0–2.0)
BASOS ABS: 0 10*3/uL (ref 0.0–0.1)
EOS ABS: 0.1 10*3/uL (ref 0.0–0.5)
EOS%: 0.8 % (ref 0.0–7.0)
HCT: 30.9 % — ABNORMAL LOW (ref 38.4–49.9)
HEMOGLOBIN: 9.9 g/dL — AB (ref 13.0–17.1)
LYMPH%: 14.3 % (ref 14.0–49.0)
MCH: 24.9 pg — ABNORMAL LOW (ref 27.2–33.4)
MCHC: 32 g/dL (ref 32.0–36.0)
MCV: 77.6 fL — AB (ref 79.3–98.0)
MONO#: 0.5 10*3/uL (ref 0.1–0.9)
MONO%: 8.9 % (ref 0.0–14.0)
NEUT#: 4.5 10*3/uL (ref 1.5–6.5)
NEUT%: 75.8 % — AB (ref 39.0–75.0)
PLATELETS: 161 10*3/uL (ref 140–400)
RBC: 3.98 10*6/uL — ABNORMAL LOW (ref 4.20–5.82)
RDW: 15 % — ABNORMAL HIGH (ref 11.0–14.6)
WBC: 6 10*3/uL (ref 4.0–10.3)
lymph#: 0.9 10*3/uL (ref 0.9–3.3)
nRBC: 0 % (ref 0–0)

## 2013-01-25 MED ORDER — ONDANSETRON 8 MG/NS 50 ML IVPB
INTRAVENOUS | Status: AC
  Filled 2013-01-25: qty 8

## 2013-01-25 MED ORDER — SODIUM CHLORIDE 0.9 % IV SOLN
Freq: Once | INTRAVENOUS | Status: AC
  Administered 2013-01-25: 11:00:00 via INTRAVENOUS

## 2013-01-25 MED ORDER — DEXTROSE 5 % IV SOLN
27.0000 mg/m2 | Freq: Once | INTRAVENOUS | Status: AC
  Administered 2013-01-25: 54 mg via INTRAVENOUS
  Filled 2013-01-25: qty 27

## 2013-01-25 MED ORDER — DEXAMETHASONE SODIUM PHOSPHATE 10 MG/ML IJ SOLN
10.0000 mg | Freq: Once | INTRAMUSCULAR | Status: AC
  Administered 2013-01-25: 10 mg via INTRAVENOUS

## 2013-01-25 MED ORDER — DEXAMETHASONE SODIUM PHOSPHATE 10 MG/ML IJ SOLN
INTRAMUSCULAR | Status: AC
  Filled 2013-01-25: qty 1

## 2013-01-25 MED ORDER — CYCLOPHOSPHAMIDE CHEMO INJECTION 1 GM
300.0000 mg/m2 | Freq: Once | INTRAMUSCULAR | Status: AC
  Administered 2013-01-25: 600 mg via INTRAVENOUS
  Filled 2013-01-25: qty 30

## 2013-01-25 MED ORDER — ONDANSETRON 8 MG/50ML IVPB (CHCC)
8.0000 mg | Freq: Once | INTRAVENOUS | Status: AC
  Administered 2013-01-25: 8 mg via INTRAVENOUS

## 2013-01-25 MED ORDER — SODIUM CHLORIDE 0.9 % IV SOLN
Freq: Once | INTRAVENOUS | Status: AC
  Administered 2013-01-25: 10:00:00 via INTRAVENOUS

## 2013-01-25 NOTE — Telephone Encounter (Signed)
appts made per 1/6 POF AVS and CAL given shh °

## 2013-01-25 NOTE — Patient Instructions (Signed)
[facility] Discharge Instructions for Patients Receiving Chemotherapy  Today you received the following chemotherapy agents Cytoxan, Kyprolis.  To help prevent nausea and vomiting after your treatment, we encourage you to take your nausea medication as prescribed.    If you develop nausea and vomiting that is not controlled by your nausea medication, call the clinic.   BELOW ARE SYMPTOMS THAT SHOULD BE REPORTED IMMEDIATELY:  *FEVER GREATER THAN 100.5 F  *CHILLS WITH OR WITHOUT FEVER  NAUSEA AND VOMITING THAT IS NOT CONTROLLED WITH YOUR NAUSEA MEDICATION  *UNUSUAL SHORTNESS OF BREATH  *UNUSUAL BRUISING OR BLEEDING  TENDERNESS IN MOUTH AND THROAT WITH OR WITHOUT PRESENCE OF ULCERS  *URINARY PROBLEMS  *BOWEL PROBLEMS  UNUSUAL RASH Items with * indicate a potential emergency and should be followed up as soon as possible.  Feel free to call the clinic should you have any questions or concerns. The clinic phone number is [phone].  It was my pleasure to take care of you today!

## 2013-01-25 NOTE — Patient Instructions (Signed)
Followup visit in 2 weeks. 

## 2013-01-25 NOTE — Progress Notes (Signed)
[place] [phone]   [phone]  OFFICE PROGRESS NOTE  [name] [address] 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   PRIOR THERAPY: :  1. Status post 6 cycles of systemic chemotherapy with Revlimid and Decadron, last dose was given 07/21/2010 with very good response. 2. 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 [name].  3. maintenance Revlimid at 10 mg by mouth daily status post 2 months. Therapy began 01/18/2011. 4. maintenance Revlimid at 15 mg by mouth daily with prophylactic dose Coumadin at 2 mg by mouth daily. 5. Systemic chemotherapy with Velcade at 1.3 mg per meter squared given on days 1, 4, 8 and 11 and Doxil at 30 mg per meter square given on day 4 and Decadron 40 mg by mouth on weekly basis given every 3 weeks. Status post 4 cycles. 6. Zometa 4 mg IV every 4 weeks.  7. Velcade 1.3 mg/M2 subcutaneous daily on a weekly basis with Decadron 20 mg by mouth on a weekly basis. First cycle expected on 06/21/2012. S/P 4 cycles.  CURRENT THERAPY:  1) Systemic chemotherapy with Carfilzomib, cyclophosphamide and Decadron, status post 1 cycle. First cycle started on 08/09/2012. He is status post 6 cycles.   INTERVAL HISTORY: Leeandre Nordling Corigliano 68 y.o. male returns to the clinic today for followup visit. The patient is feeling fine with no specific complaints. He denied having any significant weight loss or night sweats. He has no fever or chills. He has no nausea or vomiting. The patient denied having any significant chest pain, shortness of breath, cough or hemoptysis. 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 He had repeat myeloma panel performed recently and he is here today for evaluation  and discussion of his lab results.  MEDICAL HISTORY: Past Medical History  Diagnosis Date  . Multiple myeloma   . Diabetes mellitus 07/03/2011  . Hypertension 07/03/2011  . Hyperlipidemia 07/03/2011  . Colon polyps 2012  . Gastric ulcer     ALLERGIES:  has No Known Allergies.  MEDICATIONS:  Current Outpatient Prescriptions  Medication Sig Dispense Refill  . ACCU-CHEK AVIVA PLUS test strip       . aspirin 81 MG tablet Take 81 mg by mouth daily.      [name] dexamethasone (DECADRON) 4 MG tablet 20 Milligram by mouth every week start with the first dose of chemotherapy  80 tablet  2  . ezetimibe (ZETIA) 10 MG tablet Take 10 mg by mouth daily.        . haemophilus B polysaccharide conjugate vaccine (ACTHIB) injection Inject 0.5 mLs into the muscle once.  1 each  0  . lisinopril (PRINIVIL,ZESTRIL) 10 MG tablet Take 5 mg by mouth daily.       . pantoprazole (PROTONIX) 40 MG tablet Take 1 tablet (40 mg total) by mouth 2 (two) times daily.  60 tablet  11  . pioglitazone (ACTOS) 15 MG tablet Take 15 mg by mouth daily.      . prochlorperazine (COMPAZINE) 10 MG tablet Take 1 tablet (10 mg total) by mouth every 6 (six) hours as needed.  30 tablet  0  . simvastatin (ZOCOR) 10 MG tablet Take 10 mg by mouth at bedtime.        . temazepam (RESTORIL) 15 MG  capsule Take 1 capsule (15 mg total) by mouth at bedtime as needed for sleep.  30 capsule  0  . valACYclovir (VALTREX) 500 MG tablet Take 1 tablet (500 mg total) by mouth daily.  30 tablet  3   No current facility-administered medications for this visit.    SURGICAL HISTORY:  Past Surgical History  Procedure Laterality Date  . Limbal stem cell transplant    . Humerus fracture surgery      right    REVIEW OF SYSTEMS:  A comprehensive review of systems was negative except for: Neurological: positive for paresthesia   PHYSICAL EXAMINATION: General appearance: alert, cooperative and no distress Head: Normocephalic, without obvious abnormality,  atraumatic Neck: no adenopathy, no JVD, supple, symmetrical, trachea midline and thyroid not enlarged, symmetric, no tenderness/mass/nodules Lymph nodes: Cervical, supraclavicular, and axillary nodes normal. Resp: clear to auscultation bilaterally Back: symmetric, no curvature. ROM normal. No CVA tenderness. Cardio: regular rate and rhythm, S1, S2 normal, no murmur, click, rub or gallop GI: soft, non-tender; bowel sounds normal; no masses,  no organomegaly Extremities: extremities normal, atraumatic, no cyanosis or edema  ECOG PERFORMANCE STATUS: 1 - Symptomatic but completely ambulatory  Blood pressure 147/72, pulse 60, temperature 97.6 F (36.4 C), temperature source Oral, resp. rate 18, height 6' 1"  (1.854 m), weight 177 lb 8 oz (80.513 kg), SpO2 100.00%.  LABORATORY DATA: Lab Results  Component Value Date   WBC 6.0 01/25/2013   HGB 9.9* 01/25/2013   HCT 30.9* 01/25/2013   MCV 77.6* 01/25/2013   PLT 161 01/25/2013      Chemistry      Component Value Date/Time   NA 137 01/18/2013 1049   NA 137 02/04/2012 2331   K 4.7 01/18/2013 1049   K 3.8 02/04/2012 2331   CL 103 07/12/2012 0907   CL 91* 02/04/2012 2331   CO2 20* 01/18/2013 1049   CO2 34* 02/04/2012 2331   BUN 13.9 01/18/2013 1049   BUN 28* 02/04/2012 2331   CREATININE 1.0 01/18/2013 1049   CREATININE 1.10 02/04/2012 2331      Component Value Date/Time   CALCIUM 9.2 01/18/2013 1049   CALCIUM 9.5 02/04/2012 2331   ALKPHOS 46 01/18/2013 1049   ALKPHOS 71 02/04/2012 2331   AST 12 01/18/2013 1049   AST 18 02/04/2012 2331   ALT 16 01/18/2013 1049   ALT 16 02/04/2012 2331   BILITOT 1.50* 01/18/2013 1049   BILITOT 1.3* 02/04/2012 2331     Other lab results: IgG 226, IgA 144 and IgM 5, beta-2 microglobulin 1.46, free kappa light chain 0.03, free lambda light chain 2.65 with a kappa/lambda ratio of 0.01.  RADIOGRAPHIC STUDIES: No results found.  ASSESSMENT AND PLAN: This is a very pleasant 68 years old [place] American male with  history of multiple myeloma currently undergoing systemic chemotherapy with Carfilzomib, cyclophosphamide and Decadron status post 6 cycles. The patient is tolerating his treatment fairly well with no significant adverse effects.  His recent myeloma panel showed further improvement in his disease. I discussed the lab result with the patient today. I gave him the option of taking a break of chemotherapy and observation versus proceeding with further treatment with the same regimen. The patient mentions that he has no issues with the chemotherapy and he would like to continue with the treatment. He'll start cycle #7 today.  He would come back for followup visit in 2 weeks for reevaluation. He was advised to call immediately if he has any concerning symptoms in the  interval.  The patient voices understanding of current disease status and treatment options and is in agreement with the current care plan.  All questions were answered. The patient knows to call the clinic with any problems, questions or concerns. We can certainly see the patient much sooner if necessary. I spent 15 minutes of face-to-face counseling with the patient today. Total time visit was 25 minutes.

## 2013-01-26 ENCOUNTER — Ambulatory Visit (HOSPITAL_BASED_OUTPATIENT_CLINIC_OR_DEPARTMENT_OTHER): Payer: Medicare Other

## 2013-01-26 VITALS — BP 137/70 | HR 72 | Temp 98.2°F | Resp 20

## 2013-01-26 DIAGNOSIS — C9002 Multiple myeloma in relapse: Secondary | ICD-10-CM

## 2013-01-26 DIAGNOSIS — C9000 Multiple myeloma not having achieved remission: Secondary | ICD-10-CM

## 2013-01-26 DIAGNOSIS — Z5112 Encounter for antineoplastic immunotherapy: Secondary | ICD-10-CM

## 2013-01-26 MED ORDER — SODIUM CHLORIDE 0.9 % IV SOLN
Freq: Once | INTRAVENOUS | Status: AC
  Administered 2013-01-26: 10:00:00 via INTRAVENOUS

## 2013-01-26 MED ORDER — DEXAMETHASONE SODIUM PHOSPHATE 10 MG/ML IJ SOLN
10.0000 mg | Freq: Once | INTRAMUSCULAR | Status: AC
  Administered 2013-01-26: 10 mg via INTRAVENOUS

## 2013-01-26 MED ORDER — DEXTROSE 5 % IV SOLN
27.0000 mg/m2 | Freq: Once | INTRAVENOUS | Status: AC
  Administered 2013-01-26: 54 mg via INTRAVENOUS
  Filled 2013-01-26: qty 27

## 2013-01-26 MED ORDER — DEXAMETHASONE SODIUM PHOSPHATE 10 MG/ML IJ SOLN
INTRAMUSCULAR | Status: AC
  Filled 2013-01-26: qty 1

## 2013-01-26 MED ORDER — ONDANSETRON 8 MG/50ML IVPB (CHCC)
8.0000 mg | Freq: Once | INTRAVENOUS | Status: AC
  Administered 2013-01-26: 8 mg via INTRAVENOUS

## 2013-01-26 MED ORDER — ONDANSETRON 8 MG/NS 50 ML IVPB
INTRAVENOUS | Status: AC
  Filled 2013-01-26: qty 8

## 2013-02-01 ENCOUNTER — Other Ambulatory Visit (HOSPITAL_BASED_OUTPATIENT_CLINIC_OR_DEPARTMENT_OTHER): Payer: Medicare Other

## 2013-02-01 ENCOUNTER — Ambulatory Visit (HOSPITAL_BASED_OUTPATIENT_CLINIC_OR_DEPARTMENT_OTHER): Payer: Medicare Other

## 2013-02-01 VITALS — BP 133/60 | HR 68 | Temp 98.0°F

## 2013-02-01 DIAGNOSIS — C9002 Multiple myeloma in relapse: Secondary | ICD-10-CM

## 2013-02-01 DIAGNOSIS — Z5111 Encounter for antineoplastic chemotherapy: Secondary | ICD-10-CM

## 2013-02-01 DIAGNOSIS — C9000 Multiple myeloma not having achieved remission: Secondary | ICD-10-CM

## 2013-02-01 DIAGNOSIS — Z5112 Encounter for antineoplastic immunotherapy: Secondary | ICD-10-CM

## 2013-02-01 LAB — CBC WITH DIFFERENTIAL/PLATELET
BASO%: 0.2 % (ref 0.0–2.0)
Basophils Absolute: 0 10*3/uL (ref 0.0–0.1)
EOS%: 1.4 % (ref 0.0–7.0)
Eosinophils Absolute: 0.1 10*3/uL (ref 0.0–0.5)
HCT: 30.7 % — ABNORMAL LOW (ref 38.4–49.9)
HGB: 9.6 g/dL — ABNORMAL LOW (ref 13.0–17.1)
LYMPH%: 13.5 % — ABNORMAL LOW (ref 14.0–49.0)
MCH: 24.5 pg — ABNORMAL LOW (ref 27.2–33.4)
MCHC: 31.3 g/dL — ABNORMAL LOW (ref 32.0–36.0)
MCV: 78.3 fL — ABNORMAL LOW (ref 79.3–98.0)
MONO#: 0.3 10*3/uL (ref 0.1–0.9)
MONO%: 5.6 % (ref 0.0–14.0)
NEUT#: 4 10*3/uL (ref 1.5–6.5)
NEUT%: 79.3 % — ABNORMAL HIGH (ref 39.0–75.0)
Platelets: 98 10*3/uL — ABNORMAL LOW (ref 140–400)
RBC: 3.92 10*6/uL — ABNORMAL LOW (ref 4.20–5.82)
RDW: 14.6 % (ref 11.0–14.6)
WBC: 5 10*3/uL (ref 4.0–10.3)
lymph#: 0.7 10*3/uL — ABNORMAL LOW (ref 0.9–3.3)
nRBC: 0 % (ref 0–0)

## 2013-02-01 LAB — COMPREHENSIVE METABOLIC PANEL (CC13)
ALK PHOS: 49 U/L (ref 40–150)
ALT: 10 U/L (ref 0–55)
AST: 11 U/L (ref 5–34)
Albumin: 3.5 g/dL (ref 3.5–5.0)
Anion Gap: 11 mEq/L (ref 3–11)
BILIRUBIN TOTAL: 1.42 mg/dL — AB (ref 0.20–1.20)
BUN: 12.1 mg/dL (ref 7.0–26.0)
CO2: 22 mEq/L (ref 22–29)
CREATININE: 1 mg/dL (ref 0.7–1.3)
Calcium: 8.9 mg/dL (ref 8.4–10.4)
Chloride: 105 mEq/L (ref 98–109)
GLUCOSE: 259 mg/dL — AB (ref 70–140)
Potassium: 4.4 mEq/L (ref 3.5–5.1)
Sodium: 138 mEq/L (ref 136–145)
Total Protein: 5.8 g/dL — ABNORMAL LOW (ref 6.4–8.3)

## 2013-02-01 MED ORDER — SODIUM CHLORIDE 0.9 % IV SOLN
Freq: Once | INTRAVENOUS | Status: AC
  Administered 2013-02-01: 10:00:00 via INTRAVENOUS

## 2013-02-01 MED ORDER — ONDANSETRON 8 MG/NS 50 ML IVPB
INTRAVENOUS | Status: AC
  Filled 2013-02-01: qty 8

## 2013-02-01 MED ORDER — DEXAMETHASONE SODIUM PHOSPHATE 10 MG/ML IJ SOLN
10.0000 mg | Freq: Once | INTRAMUSCULAR | Status: AC
  Administered 2013-02-01: 10 mg via INTRAVENOUS

## 2013-02-01 MED ORDER — SODIUM CHLORIDE 0.9 % IV SOLN
300.0000 mg/m2 | Freq: Once | INTRAVENOUS | Status: AC
  Administered 2013-02-01: 600 mg via INTRAVENOUS
  Filled 2013-02-01: qty 30

## 2013-02-01 MED ORDER — ONDANSETRON 8 MG/50ML IVPB (CHCC)
8.0000 mg | Freq: Once | INTRAVENOUS | Status: AC
  Administered 2013-02-01: 8 mg via INTRAVENOUS

## 2013-02-01 MED ORDER — DEXAMETHASONE SODIUM PHOSPHATE 10 MG/ML IJ SOLN
INTRAMUSCULAR | Status: AC
  Filled 2013-02-01: qty 1

## 2013-02-01 MED ORDER — DEXTROSE 5 % IV SOLN
27.0000 mg/m2 | Freq: Once | INTRAVENOUS | Status: AC
  Administered 2013-02-01: 54 mg via INTRAVENOUS
  Filled 2013-02-01: qty 27

## 2013-02-01 NOTE — Progress Notes (Unsigned)
OK to treat with chemotherapy per Dr. Julien Nordmann with PLT 98.

## 2013-02-01 NOTE — Patient Instructions (Signed)
[facility] Discharge Instructions for Patients Receiving Chemotherapy  Today you received the following chemotherapy agents Kyprolis/Cytoxan.  To help prevent nausea and vomiting after your treatment, we encourage you to take your nausea medication as prescribed.   If you develop nausea and vomiting that is not controlled by your nausea medication, call the clinic.   BELOW ARE SYMPTOMS THAT SHOULD BE REPORTED IMMEDIATELY:  *FEVER GREATER THAN 100.5 F  *CHILLS WITH OR WITHOUT FEVER  NAUSEA AND VOMITING THAT IS NOT CONTROLLED WITH YOUR NAUSEA MEDICATION  *UNUSUAL SHORTNESS OF BREATH  *UNUSUAL BRUISING OR BLEEDING  TENDERNESS IN MOUTH AND THROAT WITH OR WITHOUT PRESENCE OF ULCERS  *URINARY PROBLEMS  *BOWEL PROBLEMS  UNUSUAL RASH Items with * indicate a potential emergency and should be followed up as soon as possible.  Feel free to call the clinic you have any questions or concerns. The clinic phone number is [phone].    

## 2013-02-02 ENCOUNTER — Encounter: Payer: Self-pay | Admitting: *Deleted

## 2013-02-02 ENCOUNTER — Ambulatory Visit (HOSPITAL_BASED_OUTPATIENT_CLINIC_OR_DEPARTMENT_OTHER): Payer: Medicare Other

## 2013-02-02 VITALS — BP 141/89 | HR 69 | Temp 97.2°F

## 2013-02-02 DIAGNOSIS — C9000 Multiple myeloma not having achieved remission: Secondary | ICD-10-CM

## 2013-02-02 DIAGNOSIS — C9002 Multiple myeloma in relapse: Secondary | ICD-10-CM

## 2013-02-02 DIAGNOSIS — Z5112 Encounter for antineoplastic immunotherapy: Secondary | ICD-10-CM

## 2013-02-02 MED ORDER — DEXTROSE 5 % IV SOLN
27.0000 mg/m2 | Freq: Once | INTRAVENOUS | Status: AC
  Administered 2013-02-02: 54 mg via INTRAVENOUS
  Filled 2013-02-02: qty 27

## 2013-02-02 MED ORDER — SODIUM CHLORIDE 0.9 % IV SOLN
Freq: Once | INTRAVENOUS | Status: AC
  Administered 2013-02-02: 09:00:00 via INTRAVENOUS

## 2013-02-02 MED ORDER — ALTEPLASE 2 MG IJ SOLR
2.0000 mg | Freq: Once | INTRAMUSCULAR | Status: DC | PRN
  Filled 2013-02-02: qty 2

## 2013-02-02 MED ORDER — DEXAMETHASONE SODIUM PHOSPHATE 10 MG/ML IJ SOLN
INTRAMUSCULAR | Status: AC
  Filled 2013-02-02: qty 1

## 2013-02-02 MED ORDER — DEXAMETHASONE SODIUM PHOSPHATE 10 MG/ML IJ SOLN
10.0000 mg | Freq: Once | INTRAMUSCULAR | Status: AC
  Administered 2013-02-02: 10 mg via INTRAVENOUS

## 2013-02-02 MED ORDER — ZOLEDRONIC ACID 4 MG/100ML IV SOLN
4.0000 mg | Freq: Once | INTRAVENOUS | Status: AC
  Administered 2013-02-02: 4 mg via INTRAVENOUS
  Filled 2013-02-02: qty 100

## 2013-02-02 MED ORDER — ONDANSETRON 8 MG/NS 50 ML IVPB
INTRAVENOUS | Status: AC
  Filled 2013-02-02: qty 8

## 2013-02-02 MED ORDER — ONDANSETRON 8 MG/50ML IVPB (CHCC)
8.0000 mg | Freq: Once | INTRAVENOUS | Status: AC
  Administered 2013-02-02: 8 mg via INTRAVENOUS

## 2013-02-02 NOTE — Progress Notes (Signed)
Chaplain made follow-up visit with pt. He stated he was doing well and just trying to get his numbers down to the level he wants before he takes a break from chemo. Pt seems to be doing well otherwise.

## 2013-02-02 NOTE — Patient Instructions (Signed)
[facility] Discharge Instructions for Patients Receiving Chemotherapy  Today you received the following chemotherapy agents Kyprolis.  To help prevent nausea and vomiting after your treatment, we encourage you to take your nausea medication as prescribed.   If you develop nausea and vomiting that is not controlled by your nausea medication, call the clinic.   BELOW ARE SYMPTOMS THAT SHOULD BE REPORTED IMMEDIATELY:  *FEVER GREATER THAN 100.5 F  *CHILLS WITH OR WITHOUT FEVER  NAUSEA AND VOMITING THAT IS NOT CONTROLLED WITH YOUR NAUSEA MEDICATION  *UNUSUAL SHORTNESS OF BREATH  *UNUSUAL BRUISING OR BLEEDING  TENDERNESS IN MOUTH AND THROAT WITH OR WITHOUT PRESENCE OF ULCERS  *URINARY PROBLEMS  *BOWEL PROBLEMS  UNUSUAL RASH Items with * indicate a potential emergency and should be followed up as soon as possible.  Feel free to call the clinic you have any questions or concerns. The clinic phone number is [phone].   Zoledronic Acid injection (Hypercalcemia, Oncology) What is this medicine? ZOLEDRONIC ACID (ZOE le dron ik AS id) lowers the amount of calcium loss from bone. It is used to treat too much calcium in your blood from cancer. It is also used to prevent complications of cancer that has spread to the bone. This medicine may be used for other purposes; ask your health care provider or pharmacist if you have questions. COMMON BRAND NAME(S): Zometa What should I tell my health care provider before I take this medicine? They need to know if you have any of these conditions: -aspirin-sensitive asthma -cancer, especially if you are receiving medicines used to treat cancer -dental disease or wear dentures -infection -kidney disease -receiving corticosteroids like dexamethasone or prednisone -an unusual or allergic reaction to zoledronic acid, other medicines, foods, dyes, or preservatives -pregnant or trying to get pregnant -breast-feeding How should I  use this medicine? This medicine is for infusion into a vein. It is given by a health care professional in a hospital or clinic setting. Talk to your pediatrician regarding the use of this medicine in children. Special care may be needed. Overdosage: If you think you have taken too much of this medicine contact a poison control center or emergency room at once. NOTE: This medicine is only for you. Do not share this medicine with others. What if I miss a dose? It is important not to miss your dose. Call your doctor or health care professional if you are unable to keep an appointment. What may interact with this medicine? -certain antibiotics given by injection -NSAIDs, medicines for pain and inflammation, like ibuprofen or naproxen -some diuretics like bumetanide, furosemide -teriparatide -thalidomide This list may not describe all possible interactions. Give your health care provider a list of all the medicines, herbs, non-prescription drugs, or dietary supplements you use. Also tell them if you smoke, drink alcohol, or use illegal drugs. Some items may interact with your medicine. What should I watch for while using this medicine? Visit your doctor or health care professional for regular checkups. It may be some time before you see the benefit from this medicine. Do not stop taking your medicine unless your doctor tells you to. Your doctor may order blood tests or other tests to see how you are doing. Women should inform their doctor if they wish to become pregnant or think they might be pregnant. There is a potential for serious side effects to an unborn child. Talk to your health care professional or pharmacist for more information. You should make sure that you get   enough calcium and vitamin D while you are taking this medicine. Discuss the foods you eat and the vitamins you take with your health care professional. Some people who take this medicine have severe bone, joint, and/or muscle pain.  This medicine may also increase your risk for jaw problems or a broken thigh bone. Tell your doctor right away if you have severe pain in your jaw, bones, joints, or muscles. Tell your doctor if you have any pain that does not go away or that gets worse. Tell your dentist and dental surgeon that you are taking this medicine. You should not have major dental surgery while on this medicine. See your dentist to have a dental exam and fix any dental problems before starting this medicine. Take good care of your teeth while on this medicine. Make sure you see your dentist for regular follow-up appointments. What side effects may I notice from receiving this medicine? Side effects that you should report to your doctor or health care professional as soon as possible: -allergic reactions like skin rash, itching or hives, swelling of the face, lips, or tongue -anxiety, confusion, or depression -breathing problems -changes in vision -eye pain -feeling faint or lightheaded, falls -jaw pain, especially after dental work -mouth sores -muscle cramps, stiffness, or weakness -trouble passing urine or change in the amount of urine Side effects that usually do not require medical attention (report to your doctor or health care professional if they continue or are bothersome): -bone, joint, or muscle pain -constipation -diarrhea -fever -hair loss -irritation at site where injected -loss of appetite -nausea, vomiting -stomach upset -trouble sleeping -trouble swallowing -weak or tired This list may not describe all possible side effects. Call your doctor for medical advice about side effects. You may report side effects to FDA at 1-800-FDA-1088. Where should I keep my medicine? This drug is given in a hospital or clinic and will not be stored at home. NOTE: This sheet is a summary. It may not cover all possible information. If you have questions about this medicine, talk to your doctor, pharmacist, or  health care provider.  2014, [employer]vier/Gold Standard. (2012-06-17 13:03:13)

## 2013-02-08 ENCOUNTER — Ambulatory Visit (HOSPITAL_BASED_OUTPATIENT_CLINIC_OR_DEPARTMENT_OTHER): Payer: Medicare Other

## 2013-02-08 ENCOUNTER — Telehealth: Payer: Self-pay | Admitting: Internal Medicine

## 2013-02-08 ENCOUNTER — Telehealth: Payer: Self-pay | Admitting: *Deleted

## 2013-02-08 ENCOUNTER — Encounter: Payer: Self-pay | Admitting: Physician Assistant

## 2013-02-08 ENCOUNTER — Ambulatory Visit (HOSPITAL_BASED_OUTPATIENT_CLINIC_OR_DEPARTMENT_OTHER): Payer: Medicare Other | Admitting: Physician Assistant

## 2013-02-08 ENCOUNTER — Other Ambulatory Visit: Payer: Medicare Other

## 2013-02-08 VITALS — BP 168/72 | HR 60 | Temp 97.7°F | Resp 19 | Ht 73.0 in | Wt 182.4 lb

## 2013-02-08 DIAGNOSIS — C9000 Multiple myeloma not having achieved remission: Secondary | ICD-10-CM

## 2013-02-08 DIAGNOSIS — Z5112 Encounter for antineoplastic immunotherapy: Secondary | ICD-10-CM

## 2013-02-08 DIAGNOSIS — Z5111 Encounter for antineoplastic chemotherapy: Secondary | ICD-10-CM

## 2013-02-08 DIAGNOSIS — C9002 Multiple myeloma in relapse: Secondary | ICD-10-CM

## 2013-02-08 LAB — COMPREHENSIVE METABOLIC PANEL (CC13)
ALT: 12 U/L (ref 0–55)
AST: 11 U/L (ref 5–34)
Albumin: 3.8 g/dL (ref 3.5–5.0)
Alkaline Phosphatase: 43 U/L (ref 40–150)
Anion Gap: 8 mEq/L (ref 3–11)
BILIRUBIN TOTAL: 1.59 mg/dL — AB (ref 0.20–1.20)
BUN: 8.6 mg/dL (ref 7.0–26.0)
CO2: 27 mEq/L (ref 22–29)
Calcium: 9 mg/dL (ref 8.4–10.4)
Chloride: 104 mEq/L (ref 98–109)
Creatinine: 0.8 mg/dL (ref 0.7–1.3)
Glucose: 187 mg/dl — ABNORMAL HIGH (ref 70–140)
POTASSIUM: 3.9 meq/L (ref 3.5–5.1)
SODIUM: 138 meq/L (ref 136–145)
TOTAL PROTEIN: 6.2 g/dL — AB (ref 6.4–8.3)

## 2013-02-08 LAB — CBC WITH DIFFERENTIAL/PLATELET
BASO%: 0 % (ref 0.0–2.0)
Basophils Absolute: 0 10*3/uL (ref 0.0–0.1)
EOS%: 1.2 % (ref 0.0–7.0)
Eosinophils Absolute: 0.1 10*3/uL (ref 0.0–0.5)
HCT: 28.1 % — ABNORMAL LOW (ref 38.4–49.9)
HGB: 8.9 g/dL — ABNORMAL LOW (ref 13.0–17.1)
LYMPH#: 0.7 10*3/uL — AB (ref 0.9–3.3)
LYMPH%: 15.9 % (ref 14.0–49.0)
MCH: 24.5 pg — AB (ref 27.2–33.4)
MCHC: 31.7 g/dL — AB (ref 32.0–36.0)
MCV: 77.4 fL — AB (ref 79.3–98.0)
MONO#: 0.3 10*3/uL (ref 0.1–0.9)
MONO%: 7.7 % (ref 0.0–14.0)
NEUT#: 3.2 10*3/uL (ref 1.5–6.5)
NEUT%: 75.2 % — ABNORMAL HIGH (ref 39.0–75.0)
NRBC: 0 % (ref 0–0)
Platelets: 95 10*3/uL — ABNORMAL LOW (ref 140–400)
RBC: 3.63 10*6/uL — AB (ref 4.20–5.82)
RDW: 14.7 % — AB (ref 11.0–14.6)
WBC: 4.3 10*3/uL (ref 4.0–10.3)

## 2013-02-08 LAB — TECHNOLOGIST REVIEW

## 2013-02-08 MED ORDER — ONDANSETRON 8 MG/NS 50 ML IVPB
INTRAVENOUS | Status: AC
  Filled 2013-02-08: qty 8

## 2013-02-08 MED ORDER — DEXTROSE 5 % IV SOLN
27.0000 mg/m2 | Freq: Once | INTRAVENOUS | Status: AC
  Administered 2013-02-08: 54 mg via INTRAVENOUS
  Filled 2013-02-08: qty 27

## 2013-02-08 MED ORDER — DEXAMETHASONE SODIUM PHOSPHATE 10 MG/ML IJ SOLN
INTRAMUSCULAR | Status: AC
  Filled 2013-02-08: qty 1

## 2013-02-08 MED ORDER — SODIUM CHLORIDE 0.9 % IV SOLN
Freq: Once | INTRAVENOUS | Status: AC
  Administered 2013-02-08: 11:00:00 via INTRAVENOUS

## 2013-02-08 MED ORDER — ONDANSETRON 8 MG/50ML IVPB (CHCC)
8.0000 mg | Freq: Once | INTRAVENOUS | Status: AC
  Administered 2013-02-08: 8 mg via INTRAVENOUS

## 2013-02-08 MED ORDER — DEXAMETHASONE SODIUM PHOSPHATE 10 MG/ML IJ SOLN
10.0000 mg | Freq: Once | INTRAMUSCULAR | Status: AC
  Administered 2013-02-08: 10 mg via INTRAVENOUS

## 2013-02-08 MED ORDER — SODIUM CHLORIDE 0.9 % IV SOLN
300.0000 mg/m2 | Freq: Once | INTRAVENOUS | Status: AC
  Administered 2013-02-08: 600 mg via INTRAVENOUS
  Filled 2013-02-08: qty 30

## 2013-02-08 NOTE — Patient Instructions (Signed)
Follow up in 2 weeks Soak your heel in Epson salts for symptomatic relief

## 2013-02-08 NOTE — Telephone Encounter (Signed)
Per staff message and POF I have scheduled appts.  JMW  

## 2013-02-08 NOTE — Progress Notes (Signed)
OK to treat with platelets 95 per Dr. Julien Nordmann.

## 2013-02-08 NOTE — Patient Instructions (Signed)
[facility] Discharge Instructions for Patients Receiving Chemotherapy  Today you received the following chemotherapy agents: Cytoxan and Kyprolis  To help prevent nausea and vomiting after your treatment, we encourage you to take your nausea medication as needed.   If you develop nausea and vomiting that is not controlled by your nausea medication, call the clinic.   BELOW ARE SYMPTOMS THAT SHOULD BE REPORTED IMMEDIATELY:  *FEVER GREATER THAN 100.5 F  *CHILLS WITH OR WITHOUT FEVER  NAUSEA AND VOMITING THAT IS NOT CONTROLLED WITH YOUR NAUSEA MEDICATION  *UNUSUAL SHORTNESS OF BREATH  *UNUSUAL BRUISING OR BLEEDING  TENDERNESS IN MOUTH AND THROAT WITH OR WITHOUT PRESENCE OF ULCERS  *URINARY PROBLEMS  *BOWEL PROBLEMS  UNUSUAL RASH Items with * indicate a potential emergency and should be followed up as soon as possible.  Feel free to call the clinic you have any questions or concerns. The clinic phone number is [phone].

## 2013-02-08 NOTE — Progress Notes (Addendum)
[facility] [phone]   [phone]  OFFICE PROGRESS NOTE  [name] [address] 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   PRIOR THERAPY: :  1. Status post 6 cycles of systemic chemotherapy with Revlimid and Decadron, last dose was given 07/21/2010 with very good response. 2. 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 [name].  3. maintenance Revlimid at 10 mg by mouth daily status post 2 months. Therapy began 01/18/2011. 4. maintenance Revlimid at 15 mg by mouth daily with prophylactic dose Coumadin at 2 mg by mouth daily. 5. Systemic chemotherapy with Velcade at 1.3 mg per meter squared given on days 1, 4, 8 and 11 and Doxil at 30 mg per meter square given on day 4 and Decadron 40 mg by mouth on weekly basis given every 3 weeks. Status post 4 cycles. 6. Zometa 4 mg IV every 4 weeks.  7. Velcade 1.3 mg/M2 subcutaneous daily on a weekly basis with Decadron 20 mg by mouth on a weekly basis. First cycle expected on 06/21/2012. S/P 4 cycles.  CURRENT THERAPY:  1) Systemic chemotherapy with Carfilzomib, cyclophosphamide and Decadron, status post 1 cycle. First cycle started on 08/09/2012. He is status post 6 cycles as well as days 1,2,8,and 9 of cycle #7.   INTERVAL HISTORY: Eddie Thomas 68 y.o. male returns to the clinic today for followup visit. The patient is feeling fine with no specific complaints except for left heel pain. He states that he was walking an average of 20 miles per week but had to stop recently due to left heel pain. He denies any specific trauma. He denied having any significant weight loss or night sweats. He has no fever or chills. He has no nausea or vomiting. The patient denied having any significant chest pain, shortness of breath, cough or hemoptysis. 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   MEDICAL HISTORY: Past Medical History  Diagnosis Date  . Multiple myeloma   . Diabetes mellitus 07/03/2011  . Hypertension 07/03/2011  . Hyperlipidemia 07/03/2011  . Colon polyps 2012  . Gastric ulcer     ALLERGIES:  has No Known Allergies.  MEDICATIONS:  Current Outpatient Prescriptions  Medication Sig Dispense Refill  . ACCU-CHEK AVIVA PLUS test strip       . aspirin 81 MG tablet Take 81 mg by mouth daily.      [name] dexamethasone (DECADRON) 4 MG tablet 20 Milligram by mouth every week start with the first dose of chemotherapy  80 tablet  2  . ezetimibe (ZETIA) 10 MG tablet Take 10 mg by mouth daily.        . haemophilus B polysaccharide conjugate vaccine (ACTHIB) injection Inject 0.5 mLs into the muscle once.  1 each  0  . lisinopril (PRINIVIL,ZESTRIL) 10 MG tablet Take 5 mg by mouth daily.       . pantoprazole (PROTONIX) 40 MG tablet Take 1 tablet (40 mg total) by mouth 2 (two) times daily.  60 tablet  11  . pioglitazone (ACTOS) 15 MG tablet Take 15 mg by mouth daily.      . prochlorperazine (COMPAZINE) 10 MG tablet Take 1 tablet (10 mg total) by mouth every 6 (six) hours as needed.  30 tablet  0  . simvastatin (  ZOCOR) 10 MG tablet Take 10 mg by mouth at bedtime.        . temazepam (RESTORIL) 15 MG capsule Take 1 capsule (15 mg total) by mouth at bedtime as needed for sleep.  30 capsule  0  . valACYclovir (VALTREX) 500 MG tablet Take 1 tablet (500 mg total) by mouth daily.  30 tablet  3   No current facility-administered medications for this visit.    SURGICAL HISTORY:  Past Surgical History  Procedure Laterality Date  . Limbal stem cell transplant    . Humerus fracture surgery      right    REVIEW OF SYSTEMS:  A comprehensive review of systems was negative except for: Neurological: positive for paresthesia   PHYSICAL EXAMINATION: General appearance:  alert, cooperative and no distress Head: Normocephalic, without obvious abnormality, atraumatic Neck: no adenopathy, no JVD, supple, symmetrical, trachea midline and thyroid not enlarged, symmetric, no tenderness/mass/nodules Lymph nodes: Cervical, supraclavicular, and axillary nodes normal. Resp: clear to auscultation bilaterally Back: symmetric, no curvature. ROM normal. No CVA tenderness. Cardio: regular rate and rhythm, S1, S2 normal, no murmur, click, rub or gallop GI: soft, non-tender; bowel sounds normal; no masses,  no organomegaly Extremities: extremities normal, atraumatic, no cyanosis or edema, point tenderness left calcaneus extending towards the mid foot with extension. No warmth or erythema noted  ECOG PERFORMANCE STATUS: 1 - Symptomatic but completely ambulatory  Blood pressure 168/72, pulse 60, temperature 97.7 F (36.5 C), temperature source Oral, resp. rate 19, height 6' 1"  (1.854 m), weight 182 lb 6.4 oz (82.736 kg).  LABORATORY DATA: Lab Results  Component Value Date   WBC 4.3 02/08/2013   HGB 8.9* 02/08/2013   HCT 28.1* 02/08/2013   MCV 77.4* 02/08/2013   PLT 95 Large platelets present* 02/08/2013      Chemistry      Component Value Date/Time   NA 138 02/01/2013 0939   NA 137 02/04/2012 2331   K 4.4 02/01/2013 0939   K 3.8 02/04/2012 2331   CL 103 07/12/2012 0907   CL 91* 02/04/2012 2331   CO2 22 02/01/2013 0939   CO2 34* 02/04/2012 2331   BUN 12.1 02/01/2013 0939   BUN 28* 02/04/2012 2331   CREATININE 1.0 02/01/2013 0939   CREATININE 1.10 02/04/2012 2331      Component Value Date/Time   CALCIUM 8.9 02/01/2013 0939   CALCIUM 9.5 02/04/2012 2331   ALKPHOS 49 02/01/2013 0939   ALKPHOS 71 02/04/2012 2331   AST 11 02/01/2013 0939   AST 18 02/04/2012 2331   ALT 10 02/01/2013 0939   ALT 16 02/04/2012 2331   BILITOT 1.42* 02/01/2013 0939   BILITOT 1.3* 02/04/2012 2331     Other lab results: IgG 226, IgA 144 and IgM 5, beta-2 microglobulin 1.46, free kappa light chain 0.03,  free lambda light chain 2.65 with a kappa/lambda ratio of 0.01.  RADIOGRAPHIC STUDIES: No results found.  ASSESSMENT AND PLAN: This is a very pleasant 68 years old [place] American male with history of multiple myeloma currently undergoing systemic chemotherapy with Carfilzomib, cyclophosphamide and Decadron status post 6 cycles as well as days 1, 2, 8 and 9 of cycle #7. The patient is tolerating his treatment fairly well with no significant adverse effects.  His recent myeloma panel showed further improvement in his disease. Patient was discussed with also seen by Dr. Julien Nordmann. He will proceed with day #15 of cycle #7 as scheduled. He'll return in 2 weeks at the start of cycle #8  and going forward we will be seen on a monthly basis at the start of subsequent cycles. He will monitor his left heel pain and will soaks in Epsom salts for symptomatic relief. Should his pain continue we will obtain plain films for further evaluation.  He was advised to call immediately if he has any concerning symptoms in the interval.  The patient voices understanding of current disease status and treatment options and is in agreement with the current care plan.  All questions were answered. The patient knows to call the clinic with any problems, questions or concerns. We can certainly see the patient much sooner if necessary.  Carlton Adam PA-C  ADDENDUM: Hematology/Oncology Attending: I had the face to face encounter with the patient. I recommended his care plan. This is a very pleasant 68 years old [place] American male with recurrent multiple myeloma currently on treatment with Carfilzomib, Cytoxan and dexamethasone status post 6 cycles. He is rating his treatment fairly well with no significant adverse effects. I recommended for the patient to continue with remaining portion of cycle #7 today as scheduled. He would come back for follow up visit in 2 weeks with the start of cycle #8. He was advised to call  immediately if he has any concerning symptoms in the interval.  Disclaimer: This note was dictated with voice recognition software. Similar sounding words can inadvertently be transcribed and may not be corrected upon review.  Eilleen Kempf., MD 02/08/2013

## 2013-02-08 NOTE — Telephone Encounter (Signed)
Gave pt appt for lab and MD for january and february 2015, emailed Sharyn Lull regrding chemo

## 2013-02-09 ENCOUNTER — Telehealth: Payer: Self-pay | Admitting: Internal Medicine

## 2013-02-09 ENCOUNTER — Ambulatory Visit (HOSPITAL_BASED_OUTPATIENT_CLINIC_OR_DEPARTMENT_OTHER): Payer: Medicare Other

## 2013-02-09 VITALS — BP 135/75 | HR 66 | Temp 98.1°F | Resp 20

## 2013-02-09 DIAGNOSIS — C9000 Multiple myeloma not having achieved remission: Secondary | ICD-10-CM

## 2013-02-09 DIAGNOSIS — C9002 Multiple myeloma in relapse: Secondary | ICD-10-CM

## 2013-02-09 DIAGNOSIS — Z5112 Encounter for antineoplastic immunotherapy: Secondary | ICD-10-CM

## 2013-02-09 MED ORDER — DEXAMETHASONE SODIUM PHOSPHATE 10 MG/ML IJ SOLN
10.0000 mg | Freq: Once | INTRAMUSCULAR | Status: AC
  Administered 2013-02-09: 10 mg via INTRAVENOUS

## 2013-02-09 MED ORDER — DEXAMETHASONE SODIUM PHOSPHATE 10 MG/ML IJ SOLN
INTRAMUSCULAR | Status: AC
  Filled 2013-02-09: qty 1

## 2013-02-09 MED ORDER — DEXTROSE 5 % IV SOLN
27.0000 mg/m2 | Freq: Once | INTRAVENOUS | Status: AC
  Administered 2013-02-09: 54 mg via INTRAVENOUS
  Filled 2013-02-09: qty 27

## 2013-02-09 MED ORDER — ONDANSETRON 8 MG/50ML IVPB (CHCC)
8.0000 mg | Freq: Once | INTRAVENOUS | Status: AC
  Administered 2013-02-09: 8 mg via INTRAVENOUS

## 2013-02-09 MED ORDER — SODIUM CHLORIDE 0.9 % IV SOLN
Freq: Once | INTRAVENOUS | Status: AC
  Administered 2013-02-09: 10:00:00 via INTRAVENOUS

## 2013-02-09 MED ORDER — ONDANSETRON 8 MG/NS 50 ML IVPB
INTRAVENOUS | Status: AC
  Filled 2013-02-09: qty 8

## 2013-02-09 NOTE — Telephone Encounter (Signed)
Talked to pt and he already has appt calendar  for february 2015

## 2013-02-09 NOTE — Patient Instructions (Signed)
[facility] Discharge Instructions for Patients Receiving Chemotherapy  Today you received the following chemotherapy agents :  Kyprolis.  To help prevent nausea and vomiting after your treatment, we encourage you to take your nausea medication as  Instructed by your physician.   If you develop nausea and vomiting that is not controlled by your nausea medication, call the clinic.   BELOW ARE SYMPTOMS THAT SHOULD BE REPORTED IMMEDIATELY:  *FEVER GREATER THAN 100.5 F  *CHILLS WITH OR WITHOUT FEVER  NAUSEA AND VOMITING THAT IS NOT CONTROLLED WITH YOUR NAUSEA MEDICATION  *UNUSUAL SHORTNESS OF BREATH  *UNUSUAL BRUISING OR BLEEDING  TENDERNESS IN MOUTH AND THROAT WITH OR WITHOUT PRESENCE OF ULCERS  *URINARY PROBLEMS  *BOWEL PROBLEMS  UNUSUAL RASH Items with * indicate a potential emergency and should be followed up as soon as possible.  Feel free to call the clinic you have any questions or concerns. The clinic phone number is [phone].

## 2013-02-15 ENCOUNTER — Other Ambulatory Visit (HOSPITAL_BASED_OUTPATIENT_CLINIC_OR_DEPARTMENT_OTHER): Payer: Medicare Other

## 2013-02-15 DIAGNOSIS — C9002 Multiple myeloma in relapse: Secondary | ICD-10-CM

## 2013-02-15 LAB — COMPREHENSIVE METABOLIC PANEL (CC13)
ALT: 15 U/L (ref 0–55)
ANION GAP: 8 meq/L (ref 3–11)
AST: 13 U/L (ref 5–34)
Albumin: 3.6 g/dL (ref 3.5–5.0)
Alkaline Phosphatase: 40 U/L (ref 40–150)
BILIRUBIN TOTAL: 1.46 mg/dL — AB (ref 0.20–1.20)
BUN: 8.5 mg/dL (ref 7.0–26.0)
CALCIUM: 9.2 mg/dL (ref 8.4–10.4)
CHLORIDE: 106 meq/L (ref 98–109)
CO2: 26 meq/L (ref 22–29)
CREATININE: 0.8 mg/dL (ref 0.7–1.3)
GLUCOSE: 131 mg/dL (ref 70–140)
Potassium: 4.4 mEq/L (ref 3.5–5.1)
Sodium: 140 mEq/L (ref 136–145)
Total Protein: 5.9 g/dL — ABNORMAL LOW (ref 6.4–8.3)

## 2013-02-15 LAB — CBC WITH DIFFERENTIAL/PLATELET
BASO%: 0.2 % (ref 0.0–2.0)
BASOS ABS: 0 10*3/uL (ref 0.0–0.1)
EOS ABS: 0 10*3/uL (ref 0.0–0.5)
EOS%: 1.2 % (ref 0.0–7.0)
HCT: 30.2 % — ABNORMAL LOW (ref 38.4–49.9)
HEMOGLOBIN: 9.6 g/dL — AB (ref 13.0–17.1)
LYMPH#: 0.5 10*3/uL — AB (ref 0.9–3.3)
LYMPH%: 14.1 % (ref 14.0–49.0)
MCH: 25.2 pg — ABNORMAL LOW (ref 27.2–33.4)
MCHC: 31.7 g/dL — AB (ref 32.0–36.0)
MCV: 79.6 fL (ref 79.3–98.0)
MONO#: 0.5 10*3/uL (ref 0.1–0.9)
MONO%: 13.7 % (ref 0.0–14.0)
NEUT%: 70.8 % (ref 39.0–75.0)
NEUTROS ABS: 2.5 10*3/uL (ref 1.5–6.5)
PLATELETS: 139 10*3/uL — AB (ref 140–400)
RBC: 3.8 10*6/uL — ABNORMAL LOW (ref 4.20–5.82)
RDW: 15.2 % — ABNORMAL HIGH (ref 11.0–14.6)
WBC: 3.6 10*3/uL — AB (ref 4.0–10.3)

## 2013-02-22 ENCOUNTER — Ambulatory Visit (HOSPITAL_BASED_OUTPATIENT_CLINIC_OR_DEPARTMENT_OTHER): Payer: Medicare Other

## 2013-02-22 ENCOUNTER — Telehealth: Payer: Self-pay | Admitting: *Deleted

## 2013-02-22 ENCOUNTER — Encounter: Payer: Self-pay | Admitting: Internal Medicine

## 2013-02-22 ENCOUNTER — Telehealth: Payer: Self-pay | Admitting: Internal Medicine

## 2013-02-22 ENCOUNTER — Ambulatory Visit (HOSPITAL_BASED_OUTPATIENT_CLINIC_OR_DEPARTMENT_OTHER): Payer: Medicare Other | Admitting: Internal Medicine

## 2013-02-22 ENCOUNTER — Other Ambulatory Visit (HOSPITAL_BASED_OUTPATIENT_CLINIC_OR_DEPARTMENT_OTHER): Payer: Medicare Other

## 2013-02-22 VITALS — BP 155/80 | HR 61 | Temp 96.9°F | Resp 20 | Ht 73.0 in | Wt 181.0 lb

## 2013-02-22 DIAGNOSIS — C9002 Multiple myeloma in relapse: Secondary | ICD-10-CM

## 2013-02-22 DIAGNOSIS — C9000 Multiple myeloma not having achieved remission: Secondary | ICD-10-CM

## 2013-02-22 DIAGNOSIS — Z5112 Encounter for antineoplastic immunotherapy: Secondary | ICD-10-CM

## 2013-02-22 DIAGNOSIS — M542 Cervicalgia: Secondary | ICD-10-CM

## 2013-02-22 DIAGNOSIS — M25519 Pain in unspecified shoulder: Secondary | ICD-10-CM

## 2013-02-22 DIAGNOSIS — Z5111 Encounter for antineoplastic chemotherapy: Secondary | ICD-10-CM

## 2013-02-22 LAB — COMPREHENSIVE METABOLIC PANEL (CC13)
ALBUMIN: 4 g/dL (ref 3.5–5.0)
ALK PHOS: 45 U/L (ref 40–150)
ALT: 14 U/L (ref 0–55)
AST: 13 U/L (ref 5–34)
Anion Gap: 10 mEq/L (ref 3–11)
BUN: 10.2 mg/dL (ref 7.0–26.0)
CO2: 22 mEq/L (ref 22–29)
Calcium: 9.3 mg/dL (ref 8.4–10.4)
Chloride: 105 mEq/L (ref 98–109)
Creatinine: 0.8 mg/dL (ref 0.7–1.3)
Glucose: 154 mg/dl — ABNORMAL HIGH (ref 70–140)
POTASSIUM: 4 meq/L (ref 3.5–5.1)
SODIUM: 137 meq/L (ref 136–145)
Total Bilirubin: 1.47 mg/dL — ABNORMAL HIGH (ref 0.20–1.20)
Total Protein: 6.3 g/dL — ABNORMAL LOW (ref 6.4–8.3)

## 2013-02-22 LAB — CBC WITH DIFFERENTIAL/PLATELET
BASO%: 0.3 % (ref 0.0–2.0)
BASOS ABS: 0 10*3/uL (ref 0.0–0.1)
EOS%: 1.2 % (ref 0.0–7.0)
Eosinophils Absolute: 0 10*3/uL (ref 0.0–0.5)
HCT: 32 % — ABNORMAL LOW (ref 38.4–49.9)
HEMOGLOBIN: 10 g/dL — AB (ref 13.0–17.1)
LYMPH#: 0.7 10*3/uL — AB (ref 0.9–3.3)
LYMPH%: 20.2 % (ref 14.0–49.0)
MCH: 24.6 pg — ABNORMAL LOW (ref 27.2–33.4)
MCHC: 31.3 g/dL — AB (ref 32.0–36.0)
MCV: 78.6 fL — ABNORMAL LOW (ref 79.3–98.0)
MONO#: 0.4 10*3/uL (ref 0.1–0.9)
MONO%: 11.4 % (ref 0.0–14.0)
NEUT#: 2.3 10*3/uL (ref 1.5–6.5)
NEUT%: 66.9 % (ref 39.0–75.0)
Platelets: 137 10*3/uL — ABNORMAL LOW (ref 140–400)
RBC: 4.07 10*6/uL — ABNORMAL LOW (ref 4.20–5.82)
RDW: 15.2 % — ABNORMAL HIGH (ref 11.0–14.6)
WBC: 3.4 10*3/uL — AB (ref 4.0–10.3)
nRBC: 0 % (ref 0–0)

## 2013-02-22 MED ORDER — DEXAMETHASONE SODIUM PHOSPHATE 10 MG/ML IJ SOLN
10.0000 mg | Freq: Once | INTRAMUSCULAR | Status: AC
  Administered 2013-02-22: 10 mg via INTRAVENOUS

## 2013-02-22 MED ORDER — DEXAMETHASONE SODIUM PHOSPHATE 10 MG/ML IJ SOLN
INTRAMUSCULAR | Status: AC
  Filled 2013-02-22: qty 1

## 2013-02-22 MED ORDER — SODIUM CHLORIDE 0.9 % IV SOLN
300.0000 mg/m2 | Freq: Once | INTRAVENOUS | Status: AC
  Administered 2013-02-22: 600 mg via INTRAVENOUS
  Filled 2013-02-22: qty 30

## 2013-02-22 MED ORDER — DEXTROSE 5 % IV SOLN
27.0000 mg/m2 | Freq: Once | INTRAVENOUS | Status: AC
  Administered 2013-02-22: 54 mg via INTRAVENOUS
  Filled 2013-02-22: qty 27

## 2013-02-22 MED ORDER — ONDANSETRON 8 MG/50ML IVPB (CHCC)
8.0000 mg | Freq: Once | INTRAVENOUS | Status: AC
  Administered 2013-02-22: 8 mg via INTRAVENOUS

## 2013-02-22 MED ORDER — SODIUM CHLORIDE 0.9 % IV SOLN
Freq: Once | INTRAVENOUS | Status: AC
  Administered 2013-02-22: 12:00:00 via INTRAVENOUS

## 2013-02-22 MED ORDER — SODIUM CHLORIDE 0.9 % IV SOLN
Freq: Once | INTRAVENOUS | Status: AC
  Administered 2013-02-22: 10:00:00 via INTRAVENOUS

## 2013-02-22 MED ORDER — ONDANSETRON 8 MG/NS 50 ML IVPB
INTRAVENOUS | Status: AC
  Filled 2013-02-22: qty 8

## 2013-02-22 NOTE — Progress Notes (Signed)
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   PRIOR THERAPY: :  1. Status post 6 cycles of systemic chemotherapy with Revlimid and Decadron, last dose was given 07/21/2010 with very good response. 2. 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 [name].  3. maintenance Revlimid at 10 mg by mouth daily status post 2 months. Therapy began 01/18/2011. 4. maintenance Revlimid at 15 mg by mouth daily with prophylactic dose Coumadin at 2 mg by mouth daily. 5. Systemic chemotherapy with Velcade at 1.3 mg per meter squared given on days 1, 4, 8 and 11 and Doxil at 30 mg per meter square given on day 4 and Decadron 40 mg by mouth on weekly basis given every 3 weeks. Status post 4 cycles. 6. Zometa 4 mg IV every 4 weeks.  7. Velcade 1.3 mg/M2 subcutaneous daily on a weekly basis with Decadron 20 mg by mouth on a weekly basis. First cycle expected on 06/21/2012. S/P 4 cycles.  CURRENT THERAPY:  1) Systemic chemotherapy with Carfilzomib, cyclophosphamide and Decadron, status post 1 cycle. First cycle started on 08/09/2012. He is status post 7 cycles.   INTERVAL HISTORY: Eddie Thomas 68 y.o. male returns to the clinic today for followup visit. The patient is feeling fine with no specific complaints left neck and shoulder pain. He takes Tylenol with some improvement. He denied having any significant weight loss or night sweats. He has no fever or chills. He has no nausea or vomiting. The patient denied having any significant chest pain, shortness of breath, cough or hemoptysis. 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 He is here today  to start cycle #8.  MEDICAL HISTORY: Past Medical History  Diagnosis Date  . Multiple myeloma   . Diabetes mellitus 07/03/2011  . Hypertension 07/03/2011  . Hyperlipidemia 07/03/2011  . Colon polyps 2012  . Gastric ulcer     ALLERGIES:  has No Known Allergies.  MEDICATIONS:  Current Outpatient Prescriptions  Medication Sig Dispense Refill  . ACCU-CHEK AVIVA PLUS test strip       . aspirin 81 MG tablet Take 81 mg by mouth daily.      [name] dexamethasone (DECADRON) 4 MG tablet 20 Milligram by mouth every week start with the first dose of chemotherapy  80 tablet  2  . ezetimibe (ZETIA) 10 MG tablet Take 10 mg by mouth daily.        . haemophilus B polysaccharide conjugate vaccine (ACTHIB) injection Inject 0.5 mLs into the muscle once.  1 each  0  . lisinopril (PRINIVIL,ZESTRIL) 10 MG tablet Take 5 mg by mouth daily.       . pantoprazole (PROTONIX) 40 MG tablet Take 1 tablet (40 mg total) by mouth 2 (two) times daily.  60 tablet  11  . pioglitazone (ACTOS) 15 MG tablet Take 15 mg by mouth daily.      . prochlorperazine (COMPAZINE) 10 MG tablet Take 1 tablet (10 mg total) by mouth every 6 (six) hours as needed.  30 tablet  0  . simvastatin (ZOCOR) 10 MG tablet Take 10 mg by mouth at bedtime.        . temazepam (RESTORIL) 15 MG capsule  Take 1 capsule (15 mg total) by mouth at bedtime as needed for sleep.  30 capsule  0  . valACYclovir (VALTREX) 500 MG tablet Take 1 tablet (500 mg total) by mouth daily.  30 tablet  3   No current facility-administered medications for this visit.    SURGICAL HISTORY:  Past Surgical History  Procedure Laterality Date  . Limbal stem cell transplant    . Humerus fracture surgery      right    REVIEW OF SYSTEMS:  A comprehensive review of systems was negative except for: Musculoskeletal: positive for neck pain   PHYSICAL EXAMINATION: General appearance: alert, cooperative and no distress Head: Normocephalic, without obvious abnormality, atraumatic Neck: no  adenopathy, no JVD, supple, symmetrical, trachea midline and thyroid not enlarged, symmetric, no tenderness/mass/nodules Lymph nodes: Cervical, supraclavicular, and axillary nodes normal. Resp: clear to auscultation bilaterally Back: symmetric, no curvature. ROM normal. No CVA tenderness. Cardio: regular rate and rhythm, S1, S2 normal, no murmur, click, rub or gallop GI: soft, non-tender; bowel sounds normal; no masses,  no organomegaly Extremities: extremities normal, atraumatic, no cyanosis or edema  ECOG PERFORMANCE STATUS: 1 - Symptomatic but completely ambulatory  Blood pressure 155/80, pulse 61, temperature 96.9 F (36.1 C), temperature source Oral, resp. rate 20, height _0  (1.854 m), weight 181 lb (82.101 kg), SpO2 100.00%.  LABORATORY DATA: Lab Results  Component Value Date   WBC 3.4* 02/22/2013   HGB 10.0* 02/22/2013   HCT 32.0* 02/22/2013   MCV 78.6* 02/22/2013   PLT 137* 02/22/2013      Chemistry      Component Value Date/Time   NA 140 02/15/2013 0931   NA 137 02/04/2012 2331   K 4.4 02/15/2013 0931   K 3.8 02/04/2012 2331   CL 103 07/12/2012 0907   CL 91* 02/04/2012 2331   CO2 26 02/15/2013 0931   CO2 34* 02/04/2012 2331   BUN 8.5 02/15/2013 0931   BUN 28* 02/04/2012 2331   CREATININE 0.8 02/15/2013 0931   CREATININE 1.10 02/04/2012 2331      Component Value Date/Time   CALCIUM 9.2 02/15/2013 0931   CALCIUM 9.5 02/04/2012 2331   ALKPHOS 40 02/15/2013 0931   ALKPHOS 71 02/04/2012 2331   AST 13 02/15/2013 0931   AST 18 02/04/2012 2331   ALT 15 02/15/2013 0931   ALT 16 02/04/2012 2331   BILITOT 1.46* 02/15/2013 0931   BILITOT 1.3* 02/04/2012 2331       RADIOGRAPHIC STUDIES: No results found.  ASSESSMENT AND PLAN: This is a very pleasant 68 years old [place] American male with history of multiple myeloma currently undergoing systemic chemotherapy with Carfilzomib, cyclophosphamide and Decadron status post 6 cycles. The patient is tolerating his treatment fairly well with no  significant adverse effects.  He has mild intermittent left neck and shoulder pain started few weeks ago. He currently takes Tylenol for pain management. I advised the patient to call if his condition gets worse. I would consider imaging of that area to rule out any lytic lesions causing his pain. The patient mentions that he has no issues with the chemotherapy and he would like to continue with the treatment. He'll start cycle #8 today.  He would come back for followup visit in 4 weeks for reevaluation after repeating myeloma panel for reevaluation of his disease.[name] He was advised to call immediately if he has any concerning symptoms in the interval.  The patient voices understanding of current disease status and treatment options and is  in agreement with the current care plan.  All questions were answered. The patient knows to call the clinic with any problems, questions or concerns. We can certainly see the patient much sooner if necessary.  Disclaimer: This note was dictated with voice recognition software. Similar sounding words can inadvertently be transcribed and may not be corrected upon review.

## 2013-02-22 NOTE — Telephone Encounter (Signed)
, °

## 2013-02-22 NOTE — Telephone Encounter (Signed)
Per staff phone call and POF I have schedueld appts.  JMW  

## 2013-02-22 NOTE — Patient Instructions (Signed)
Followup visit in 4 weeks with repeat myeloma panel one-week before the next visit.

## 2013-02-22 NOTE — Patient Instructions (Signed)
[facility] Discharge Instructions for Patients Receiving Chemotherapy  Today you received the following chemotherapy agents Cytoxan and Kyprolis.  To help prevent nausea and vomiting after your treatment, we encourage you to take your nausea medication.   If you develop nausea and vomiting that is not controlled by your nausea medication, call the clinic.   BELOW ARE SYMPTOMS THAT SHOULD BE REPORTED IMMEDIATELY:  *FEVER GREATER THAN 100.5 F  *CHILLS WITH OR WITHOUT FEVER  NAUSEA AND VOMITING THAT IS NOT CONTROLLED WITH YOUR NAUSEA MEDICATION  *UNUSUAL SHORTNESS OF BREATH  *UNUSUAL BRUISING OR BLEEDING  TENDERNESS IN MOUTH AND THROAT WITH OR WITHOUT PRESENCE OF ULCERS  *URINARY PROBLEMS  *BOWEL PROBLEMS  UNUSUAL RASH Items with * indicate a potential emergency and should be followed up as soon as possible.  Feel free to call the clinic you have any questions or concerns. The clinic phone number is [phone].

## 2013-02-23 ENCOUNTER — Ambulatory Visit (HOSPITAL_BASED_OUTPATIENT_CLINIC_OR_DEPARTMENT_OTHER): Payer: Medicare Other

## 2013-02-23 VITALS — BP 136/81 | HR 71 | Temp 97.4°F | Resp 18

## 2013-02-23 DIAGNOSIS — C9000 Multiple myeloma not having achieved remission: Secondary | ICD-10-CM

## 2013-02-23 DIAGNOSIS — C9002 Multiple myeloma in relapse: Secondary | ICD-10-CM

## 2013-02-23 DIAGNOSIS — Z5112 Encounter for antineoplastic immunotherapy: Secondary | ICD-10-CM

## 2013-02-23 MED ORDER — SODIUM CHLORIDE 0.9 % IV SOLN
Freq: Once | INTRAVENOUS | Status: AC
  Administered 2013-02-23: 10:00:00 via INTRAVENOUS

## 2013-02-23 MED ORDER — DEXTROSE 5 % IV SOLN
27.0000 mg/m2 | Freq: Once | INTRAVENOUS | Status: AC
  Administered 2013-02-23: 54 mg via INTRAVENOUS
  Filled 2013-02-23: qty 27

## 2013-02-23 MED ORDER — ONDANSETRON 8 MG/NS 50 ML IVPB
INTRAVENOUS | Status: AC
Start: 2013-02-23 — End: 2013-02-23
  Filled 2013-02-23: qty 8

## 2013-02-23 MED ORDER — DEXAMETHASONE SODIUM PHOSPHATE 10 MG/ML IJ SOLN
10.0000 mg | Freq: Once | INTRAMUSCULAR | Status: AC
  Administered 2013-02-23: 10 mg via INTRAVENOUS

## 2013-02-23 MED ORDER — DEXAMETHASONE SODIUM PHOSPHATE 10 MG/ML IJ SOLN
INTRAMUSCULAR | Status: AC
Start: 2013-02-23 — End: 2013-02-23
  Filled 2013-02-23: qty 1

## 2013-02-23 MED ORDER — SODIUM CHLORIDE 0.9 % IV SOLN
Freq: Once | INTRAVENOUS | Status: AC
  Administered 2013-02-23: 11:00:00 via INTRAVENOUS

## 2013-02-23 MED ORDER — ONDANSETRON 8 MG/50ML IVPB (CHCC)
8.0000 mg | Freq: Once | INTRAVENOUS | Status: AC
  Administered 2013-02-23: 8 mg via INTRAVENOUS

## 2013-02-23 NOTE — Patient Instructions (Signed)
[facility] for [facility] Receiving Chemotherapy  Today you received the following chemotherapy agents Kyprolis.  To help prevent nausea and vomiting after your treatment, we encourage you to take your nausea medication.   If you develop nausea and vomiting that is not controlled by your nausea medication, call the clinic.   BELOW ARE SYMPTOMS THAT SHOULD BE REPORTED IMMEDIATELY:  *FEVER GREATER THAN 100.5 F  *CHILLS WITH OR WITHOUT FEVER  NAUSEA AND VOMITING THAT IS NOT CONTROLLED WITH YOUR NAUSEA MEDICATION  *UNUSUAL SHORTNESS OF BREATH  *UNUSUAL BRUISING OR BLEEDING  TENDERNESS IN MOUTH AND THROAT WITH OR WITHOUT PRESENCE OF ULCERS  *URINARY PROBLEMS  *BOWEL PROBLEMS  UNUSUAL RASH Items with * indicate a potential emergency and should be followed up as soon as possible.  Feel free to call the clinic you have any questions or concerns. The clinic phone number is [phone].    

## 2013-03-01 ENCOUNTER — Ambulatory Visit (HOSPITAL_BASED_OUTPATIENT_CLINIC_OR_DEPARTMENT_OTHER): Payer: Medicare Other

## 2013-03-01 ENCOUNTER — Other Ambulatory Visit (HOSPITAL_BASED_OUTPATIENT_CLINIC_OR_DEPARTMENT_OTHER): Payer: Medicare Other

## 2013-03-01 VITALS — BP 141/74 | HR 58 | Temp 97.6°F | Resp 18

## 2013-03-01 DIAGNOSIS — C9002 Multiple myeloma in relapse: Secondary | ICD-10-CM

## 2013-03-01 DIAGNOSIS — C9000 Multiple myeloma not having achieved remission: Secondary | ICD-10-CM

## 2013-03-01 DIAGNOSIS — Z5112 Encounter for antineoplastic immunotherapy: Secondary | ICD-10-CM

## 2013-03-01 LAB — CBC WITH DIFFERENTIAL/PLATELET
BASO%: 0.3 % (ref 0.0–2.0)
Basophils Absolute: 0 10*3/uL (ref 0.0–0.1)
EOS%: 1.3 % (ref 0.0–7.0)
Eosinophils Absolute: 0.1 10*3/uL (ref 0.0–0.5)
HCT: 29.7 % — ABNORMAL LOW (ref 38.4–49.9)
HGB: 9.5 g/dL — ABNORMAL LOW (ref 13.0–17.1)
LYMPH#: 0.7 10*3/uL — AB (ref 0.9–3.3)
LYMPH%: 17.8 % (ref 14.0–49.0)
MCH: 24.8 pg — AB (ref 27.2–33.4)
MCHC: 32 g/dL (ref 32.0–36.0)
MCV: 77.5 fL — ABNORMAL LOW (ref 79.3–98.0)
MONO#: 0.4 10*3/uL (ref 0.1–0.9)
MONO%: 9.3 % (ref 0.0–14.0)
NEUT#: 2.8 10*3/uL (ref 1.5–6.5)
NEUT%: 71.3 % (ref 39.0–75.0)
NRBC: 0 % (ref 0–0)
Platelets: 94 10*3/uL — ABNORMAL LOW (ref 140–400)
RBC: 3.83 10*6/uL — AB (ref 4.20–5.82)
RDW: 14.8 % — AB (ref 11.0–14.6)
WBC: 3.9 10*3/uL — ABNORMAL LOW (ref 4.0–10.3)

## 2013-03-01 LAB — COMPREHENSIVE METABOLIC PANEL (CC13)
ALT: 15 U/L (ref 0–55)
ANION GAP: 9 meq/L (ref 3–11)
AST: 17 U/L (ref 5–34)
Albumin: 3.6 g/dL (ref 3.5–5.0)
Alkaline Phosphatase: 37 U/L — ABNORMAL LOW (ref 40–150)
BILIRUBIN TOTAL: 1.56 mg/dL — AB (ref 0.20–1.20)
BUN: 9.4 mg/dL (ref 7.0–26.0)
CO2: 23 mEq/L (ref 22–29)
Calcium: 9.1 mg/dL (ref 8.4–10.4)
Chloride: 108 mEq/L (ref 98–109)
Creatinine: 0.8 mg/dL (ref 0.7–1.3)
GLUCOSE: 143 mg/dL — AB (ref 70–140)
POTASSIUM: 4.1 meq/L (ref 3.5–5.1)
SODIUM: 140 meq/L (ref 136–145)
TOTAL PROTEIN: 5.7 g/dL — AB (ref 6.4–8.3)

## 2013-03-01 MED ORDER — SODIUM CHLORIDE 0.9 % IV SOLN
300.0000 mg/m2 | Freq: Once | INTRAVENOUS | Status: AC
  Administered 2013-03-01: 600 mg via INTRAVENOUS
  Filled 2013-03-01: qty 30

## 2013-03-01 MED ORDER — ONDANSETRON 8 MG/NS 50 ML IVPB
INTRAVENOUS | Status: AC
  Filled 2013-03-01: qty 8

## 2013-03-01 MED ORDER — ONDANSETRON 8 MG/50ML IVPB (CHCC)
8.0000 mg | Freq: Once | INTRAVENOUS | Status: AC
  Administered 2013-03-01: 8 mg via INTRAVENOUS

## 2013-03-01 MED ORDER — DEXAMETHASONE SODIUM PHOSPHATE 10 MG/ML IJ SOLN
10.0000 mg | Freq: Once | INTRAMUSCULAR | Status: AC
  Administered 2013-03-01: 10 mg via INTRAVENOUS

## 2013-03-01 MED ORDER — SODIUM CHLORIDE 0.9 % IV SOLN
Freq: Once | INTRAVENOUS | Status: AC
  Administered 2013-03-01: 11:00:00 via INTRAVENOUS

## 2013-03-01 MED ORDER — DEXTROSE 5 % IV SOLN
27.0000 mg/m2 | Freq: Once | INTRAVENOUS | Status: AC
  Administered 2013-03-01: 54 mg via INTRAVENOUS
  Filled 2013-03-01: qty 27

## 2013-03-01 MED ORDER — DEXAMETHASONE SODIUM PHOSPHATE 10 MG/ML IJ SOLN
INTRAMUSCULAR | Status: AC
  Filled 2013-03-01: qty 1

## 2013-03-01 MED ORDER — SODIUM CHLORIDE 0.9 % IV SOLN
Freq: Once | INTRAVENOUS | Status: AC
  Administered 2013-03-01: 10:00:00 via INTRAVENOUS

## 2013-03-01 NOTE — Patient Instructions (Signed)
[facility] Discharge Instructions for Patients Receiving Chemotherapy  Today you received the following chemotherapy agents Kyprolis and Cytoxan.  To help prevent nausea and vomiting after your treatment, we encourage you to take your nausea medication.   If you develop nausea and vomiting that is not controlled by your nausea medication, call the clinic.   BELOW ARE SYMPTOMS THAT SHOULD BE REPORTED IMMEDIATELY:  *FEVER GREATER THAN 100.5 F  *CHILLS WITH OR WITHOUT FEVER  NAUSEA AND VOMITING THAT IS NOT CONTROLLED WITH YOUR NAUSEA MEDICATION  *UNUSUAL SHORTNESS OF BREATH  *UNUSUAL BRUISING OR BLEEDING  TENDERNESS IN MOUTH AND THROAT WITH OR WITHOUT PRESENCE OF ULCERS  *URINARY PROBLEMS  *BOWEL PROBLEMS  UNUSUAL RASH Items with * indicate a potential emergency and should be followed up as soon as possible.  Feel free to call the clinic you have any questions or concerns. The clinic phone number is [phone].    

## 2013-03-02 ENCOUNTER — Other Ambulatory Visit: Payer: Self-pay | Admitting: Gastroenterology

## 2013-03-02 ENCOUNTER — Ambulatory Visit (HOSPITAL_BASED_OUTPATIENT_CLINIC_OR_DEPARTMENT_OTHER): Payer: Medicare Other

## 2013-03-02 VITALS — BP 164/84 | HR 86 | Temp 97.8°F | Resp 18

## 2013-03-02 DIAGNOSIS — Z5112 Encounter for antineoplastic immunotherapy: Secondary | ICD-10-CM

## 2013-03-02 DIAGNOSIS — C9000 Multiple myeloma not having achieved remission: Secondary | ICD-10-CM

## 2013-03-02 DIAGNOSIS — C9002 Multiple myeloma in relapse: Secondary | ICD-10-CM

## 2013-03-02 MED ORDER — SODIUM CHLORIDE 0.9 % IV SOLN
Freq: Once | INTRAVENOUS | Status: AC
  Administered 2013-03-02: 12:00:00 via INTRAVENOUS

## 2013-03-02 MED ORDER — ONDANSETRON 8 MG/NS 50 ML IVPB
INTRAVENOUS | Status: AC
Start: 2013-03-02 — End: 2013-03-02
  Filled 2013-03-02: qty 8

## 2013-03-02 MED ORDER — DEXTROSE 5 % IV SOLN
27.0000 mg/m2 | Freq: Once | INTRAVENOUS | Status: AC
  Administered 2013-03-02: 54 mg via INTRAVENOUS
  Filled 2013-03-02: qty 27

## 2013-03-02 MED ORDER — SODIUM CHLORIDE 0.9 % IV SOLN
Freq: Once | INTRAVENOUS | Status: AC
  Administered 2013-03-02: 10:00:00 via INTRAVENOUS

## 2013-03-02 MED ORDER — DEXAMETHASONE SODIUM PHOSPHATE 10 MG/ML IJ SOLN
INTRAMUSCULAR | Status: AC
  Filled 2013-03-02: qty 1

## 2013-03-02 MED ORDER — DEXAMETHASONE SODIUM PHOSPHATE 10 MG/ML IJ SOLN
10.0000 mg | Freq: Once | INTRAMUSCULAR | Status: AC
  Administered 2013-03-02: 10 mg via INTRAVENOUS

## 2013-03-02 MED ORDER — ONDANSETRON 8 MG/50ML IVPB (CHCC)
8.0000 mg | Freq: Once | INTRAVENOUS | Status: AC
  Administered 2013-03-02: 8 mg via INTRAVENOUS

## 2013-03-02 NOTE — Patient Instructions (Signed)
[facility] Discharge Instructions for Patients Receiving Chemotherapy  Today you received the following chemotherapy agents Kyprolis.  To help prevent nausea and vomiting after your treatment, we encourage you to take your nausea medication.   If you develop nausea and vomiting that is not controlled by your nausea medication, call the clinic.   BELOW ARE SYMPTOMS THAT SHOULD BE REPORTED IMMEDIATELY:  *FEVER GREATER THAN 100.5 F  *CHILLS WITH OR WITHOUT FEVER  NAUSEA AND VOMITING THAT IS NOT CONTROLLED WITH YOUR NAUSEA MEDICATION  *UNUSUAL SHORTNESS OF BREATH  *UNUSUAL BRUISING OR BLEEDING  TENDERNESS IN MOUTH AND THROAT WITH OR WITHOUT PRESENCE OF ULCERS  *URINARY PROBLEMS  *BOWEL PROBLEMS  UNUSUAL RASH Items with * indicate a potential emergency and should be followed up as soon as possible.  Feel free to call the clinic you have any questions or concerns. The clinic phone number is [phone].    

## 2013-03-08 ENCOUNTER — Other Ambulatory Visit: Payer: Self-pay | Admitting: Medical Oncology

## 2013-03-08 ENCOUNTER — Other Ambulatory Visit (HOSPITAL_BASED_OUTPATIENT_CLINIC_OR_DEPARTMENT_OTHER): Payer: Medicare Other

## 2013-03-08 ENCOUNTER — Ambulatory Visit (HOSPITAL_BASED_OUTPATIENT_CLINIC_OR_DEPARTMENT_OTHER): Payer: Medicare Other

## 2013-03-08 VITALS — BP 147/91 | HR 69 | Temp 98.0°F | Resp 18

## 2013-03-08 DIAGNOSIS — Z5112 Encounter for antineoplastic immunotherapy: Secondary | ICD-10-CM

## 2013-03-08 DIAGNOSIS — C9000 Multiple myeloma not having achieved remission: Secondary | ICD-10-CM

## 2013-03-08 DIAGNOSIS — C9002 Multiple myeloma in relapse: Secondary | ICD-10-CM

## 2013-03-08 DIAGNOSIS — Z5111 Encounter for antineoplastic chemotherapy: Secondary | ICD-10-CM

## 2013-03-08 LAB — CBC WITH DIFFERENTIAL/PLATELET
BASO%: 0 % (ref 0.0–2.0)
BASOS ABS: 0 10*3/uL (ref 0.0–0.1)
EOS%: 1.2 % (ref 0.0–7.0)
Eosinophils Absolute: 0.1 10*3/uL (ref 0.0–0.5)
HEMATOCRIT: 32 % — AB (ref 38.4–49.9)
HEMOGLOBIN: 10 g/dL — AB (ref 13.0–17.1)
LYMPH#: 0.7 10*3/uL — AB (ref 0.9–3.3)
LYMPH%: 16.1 % (ref 14.0–49.0)
MCH: 24.5 pg — ABNORMAL LOW (ref 27.2–33.4)
MCHC: 31.3 g/dL — ABNORMAL LOW (ref 32.0–36.0)
MCV: 78.4 fL — ABNORMAL LOW (ref 79.3–98.0)
MONO#: 0.3 10*3/uL (ref 0.1–0.9)
MONO%: 7.7 % (ref 0.0–14.0)
NEUT%: 75 % (ref 39.0–75.0)
NEUTROS ABS: 3.2 10*3/uL (ref 1.5–6.5)
Platelets: 105 10*3/uL — ABNORMAL LOW (ref 140–400)
RBC: 4.08 10*6/uL — ABNORMAL LOW (ref 4.20–5.82)
RDW: 14.7 % — ABNORMAL HIGH (ref 11.0–14.6)
WBC: 4.3 10*3/uL (ref 4.0–10.3)
nRBC: 0 % (ref 0–0)

## 2013-03-08 LAB — COMPREHENSIVE METABOLIC PANEL
ALBUMIN: 4.2 g/dL (ref 3.5–5.2)
ALT: 14 U/L (ref 0–53)
AST: 13 U/L (ref 0–37)
Alkaline Phosphatase: 35 U/L — ABNORMAL LOW (ref 39–117)
BUN: 10 mg/dL (ref 6–23)
CALCIUM: 8.6 mg/dL (ref 8.4–10.5)
CHLORIDE: 103 meq/L (ref 96–112)
CO2: 26 meq/L (ref 19–32)
Creatinine, Ser: 0.81 mg/dL (ref 0.50–1.35)
GLUCOSE: 153 mg/dL — AB (ref 70–99)
Potassium: 4.1 mEq/L (ref 3.5–5.3)
Sodium: 137 mEq/L (ref 135–145)
Total Bilirubin: 1.2 mg/dL (ref 0.2–1.2)
Total Protein: 6 g/dL (ref 6.0–8.3)

## 2013-03-08 MED ORDER — ONDANSETRON 8 MG/NS 50 ML IVPB
INTRAVENOUS | Status: AC
  Filled 2013-03-08: qty 8

## 2013-03-08 MED ORDER — DEXTROSE 5 % IV SOLN
27.0000 mg/m2 | Freq: Once | INTRAVENOUS | Status: AC
  Administered 2013-03-08: 54 mg via INTRAVENOUS
  Filled 2013-03-08: qty 27

## 2013-03-08 MED ORDER — SODIUM CHLORIDE 0.9 % IV SOLN
300.0000 mg/m2 | Freq: Once | INTRAVENOUS | Status: AC
  Administered 2013-03-08: 600 mg via INTRAVENOUS
  Filled 2013-03-08: qty 30

## 2013-03-08 MED ORDER — DEXAMETHASONE SODIUM PHOSPHATE 10 MG/ML IJ SOLN
10.0000 mg | Freq: Once | INTRAMUSCULAR | Status: AC
  Administered 2013-03-08: 10 mg via INTRAVENOUS

## 2013-03-08 MED ORDER — ONDANSETRON 8 MG/50ML IVPB (CHCC)
8.0000 mg | Freq: Once | INTRAVENOUS | Status: AC
  Administered 2013-03-08: 8 mg via INTRAVENOUS

## 2013-03-08 MED ORDER — DEXAMETHASONE SODIUM PHOSPHATE 10 MG/ML IJ SOLN
INTRAMUSCULAR | Status: AC
  Filled 2013-03-08: qty 1

## 2013-03-08 MED ORDER — SODIUM CHLORIDE 0.9 % IV SOLN
Freq: Once | INTRAVENOUS | Status: AC
  Administered 2013-03-08: 10:00:00 via INTRAVENOUS

## 2013-03-08 MED ORDER — SODIUM CHLORIDE 0.9 % IV SOLN: Freq: Once | INTRAVENOUS | Status: DC

## 2013-03-08 NOTE — Patient Instructions (Signed)
[facility] Discharge Instructions for Patients Receiving Chemotherapy  Today you received the following chemotherapy agents  KYPROLIS, CYTOXAN To help prevent nausea and vomiting after your treatment, we encourage you to take your nausea medication as prescribed.   If you develop nausea and vomiting that is not controlled by your nausea medication, call the clinic.   BELOW ARE SYMPTOMS THAT SHOULD BE REPORTED IMMEDIATELY:  *FEVER GREATER THAN 100.5 F  *CHILLS WITH OR WITHOUT FEVER  NAUSEA AND VOMITING THAT IS NOT CONTROLLED WITH YOUR NAUSEA MEDICATION  *UNUSUAL SHORTNESS OF BREATH  *UNUSUAL BRUISING OR BLEEDING  TENDERNESS IN MOUTH AND THROAT WITH OR WITHOUT PRESENCE OF ULCERS  *URINARY PROBLEMS  *BOWEL PROBLEMS  UNUSUAL RASH Items with * indicate a potential emergency and should be followed up as soon as possible.  Feel free to call the clinic you have any questions or concerns. The clinic phone number is [phone].

## 2013-03-09 ENCOUNTER — Other Ambulatory Visit: Payer: Self-pay | Admitting: *Deleted

## 2013-03-09 ENCOUNTER — Ambulatory Visit (HOSPITAL_BASED_OUTPATIENT_CLINIC_OR_DEPARTMENT_OTHER): Payer: Medicare Other

## 2013-03-09 VITALS — BP 142/84 | HR 71 | Temp 97.6°F | Resp 18

## 2013-03-09 DIAGNOSIS — C9000 Multiple myeloma not having achieved remission: Secondary | ICD-10-CM

## 2013-03-09 DIAGNOSIS — Z5112 Encounter for antineoplastic immunotherapy: Secondary | ICD-10-CM

## 2013-03-09 MED ORDER — DEXTROSE 5 % IV SOLN
27.0000 mg/m2 | Freq: Once | INTRAVENOUS | Status: AC
  Administered 2013-03-09: 54 mg via INTRAVENOUS
  Filled 2013-03-09: qty 27

## 2013-03-09 MED ORDER — DEXAMETHASONE SODIUM PHOSPHATE 10 MG/ML IJ SOLN
INTRAMUSCULAR | Status: AC
  Filled 2013-03-09: qty 1

## 2013-03-09 MED ORDER — ONDANSETRON 8 MG/NS 50 ML IVPB
INTRAVENOUS | Status: AC
  Filled 2013-03-09: qty 8

## 2013-03-09 MED ORDER — DEXAMETHASONE SODIUM PHOSPHATE 10 MG/ML IJ SOLN
10.0000 mg | Freq: Once | INTRAMUSCULAR | Status: AC
  Administered 2013-03-09: 10 mg via INTRAVENOUS

## 2013-03-09 MED ORDER — SODIUM CHLORIDE 0.9 % IV SOLN: Freq: Once | INTRAVENOUS | Status: DC

## 2013-03-09 MED ORDER — VALACYCLOVIR HCL 500 MG PO TABS: 500.0000 mg | ORAL_TABLET | Freq: Every day | ORAL | Status: DC

## 2013-03-09 MED ORDER — ONDANSETRON 8 MG/50ML IVPB (CHCC)
8.0000 mg | Freq: Once | INTRAVENOUS | Status: AC
  Administered 2013-03-09: 8 mg via INTRAVENOUS

## 2013-03-09 MED ORDER — SODIUM CHLORIDE 0.9 % IV SOLN
Freq: Once | INTRAVENOUS | Status: AC
  Administered 2013-03-09: 09:00:00 via INTRAVENOUS

## 2013-03-09 NOTE — Patient Instructions (Signed)
[facility] Discharge Instructions for Patients Receiving Chemotherapy  Today you received the following chemotherapy agents Kyprolis  To help prevent nausea and vomiting after your treatment, we encourage you to take your nausea medication as directed/prescribed   If you develop nausea and vomiting that is not controlled by your nausea medication, call the clinic.   BELOW ARE SYMPTOMS THAT SHOULD BE REPORTED IMMEDIATELY:  *FEVER GREATER THAN 100.5 F  *CHILLS WITH OR WITHOUT FEVER  NAUSEA AND VOMITING THAT IS NOT CONTROLLED WITH YOUR NAUSEA MEDICATION  *UNUSUAL SHORTNESS OF BREATH  *UNUSUAL BRUISING OR BLEEDING  TENDERNESS IN MOUTH AND THROAT WITH OR WITHOUT PRESENCE OF ULCERS  *URINARY PROBLEMS  *BOWEL PROBLEMS  UNUSUAL RASH Items with * indicate a potential emergency and should be followed up as soon as possible.  Feel free to call the clinic you have any questions or concerns. The clinic phone number is [phone].    

## 2013-03-15 ENCOUNTER — Other Ambulatory Visit (HOSPITAL_BASED_OUTPATIENT_CLINIC_OR_DEPARTMENT_OTHER): Payer: Medicare Other

## 2013-03-15 DIAGNOSIS — C9000 Multiple myeloma not having achieved remission: Secondary | ICD-10-CM

## 2013-03-15 LAB — CBC WITH DIFFERENTIAL/PLATELET
BASO%: 0.1 % (ref 0.0–2.0)
BASOS ABS: 0 10*3/uL (ref 0.0–0.1)
EOS ABS: 0 10*3/uL (ref 0.0–0.5)
EOS%: 0.6 % (ref 0.0–7.0)
HCT: 31.3 % — ABNORMAL LOW (ref 38.4–49.9)
HEMOGLOBIN: 9.8 g/dL — AB (ref 13.0–17.1)
LYMPH%: 9 % — ABNORMAL LOW (ref 14.0–49.0)
MCH: 25.1 pg — AB (ref 27.2–33.4)
MCHC: 31.3 g/dL — ABNORMAL LOW (ref 32.0–36.0)
MCV: 80.3 fL (ref 79.3–98.0)
MONO#: 0.5 10*3/uL (ref 0.1–0.9)
MONO%: 10.8 % (ref 0.0–14.0)
NEUT#: 3.7 10*3/uL (ref 1.5–6.5)
NEUT%: 79.5 % — ABNORMAL HIGH (ref 39.0–75.0)
Platelets: 128 10*3/uL — ABNORMAL LOW (ref 140–400)
RBC: 3.9 10*6/uL — AB (ref 4.20–5.82)
RDW: 15.2 % — AB (ref 11.0–14.6)
WBC: 4.7 10*3/uL (ref 4.0–10.3)
lymph#: 0.4 10*3/uL — ABNORMAL LOW (ref 0.9–3.3)

## 2013-03-15 LAB — COMPREHENSIVE METABOLIC PANEL (CC13)
ALT: 13 U/L (ref 0–55)
AST: 11 U/L (ref 5–34)
Albumin: 3.6 g/dL (ref 3.5–5.0)
Alkaline Phosphatase: 38 U/L — ABNORMAL LOW (ref 40–150)
Anion Gap: 8 mEq/L (ref 3–11)
BILIRUBIN TOTAL: 1.39 mg/dL — AB (ref 0.20–1.20)
BUN: 9 mg/dL (ref 7.0–26.0)
CO2: 26 mEq/L (ref 22–29)
Calcium: 9.1 mg/dL (ref 8.4–10.4)
Chloride: 107 mEq/L (ref 98–109)
Creatinine: 0.8 mg/dL (ref 0.7–1.3)
GLUCOSE: 165 mg/dL — AB (ref 70–140)
POTASSIUM: 3.8 meq/L (ref 3.5–5.1)
Sodium: 141 mEq/L (ref 136–145)
Total Protein: 5.7 g/dL — ABNORMAL LOW (ref 6.4–8.3)

## 2013-03-15 LAB — LACTATE DEHYDROGENASE (CC13): LDH: 185 U/L (ref 125–245)

## 2013-03-16 LAB — KAPPA/LAMBDA LIGHT CHAINS
KAPPA FREE LGHT CHN: 0.03 mg/dL — AB (ref 0.33–1.94)
Kappa:Lambda Ratio: 0.01 — ABNORMAL LOW (ref 0.26–1.65)
Lambda Free Lght Chn: 2.17 mg/dL (ref 0.57–2.63)

## 2013-03-16 LAB — IGG, IGA, IGM
IGA: 345 mg/dL (ref 68–379)
IgG (Immunoglobin G), Serum: 200 mg/dL — ABNORMAL LOW (ref 650–1600)

## 2013-03-16 LAB — BETA 2 MICROGLOBULIN, SERUM: Beta-2 Microglobulin: 1.4 mg/L (ref 1.01–1.73)

## 2013-03-22 ENCOUNTER — Other Ambulatory Visit: Payer: Self-pay | Admitting: Medical Oncology

## 2013-03-22 ENCOUNTER — Ambulatory Visit: Payer: Medicare Other

## 2013-03-22 ENCOUNTER — Encounter: Payer: Self-pay | Admitting: Internal Medicine

## 2013-03-22 ENCOUNTER — Telehealth: Payer: Self-pay | Admitting: Internal Medicine

## 2013-03-22 ENCOUNTER — Other Ambulatory Visit (HOSPITAL_BASED_OUTPATIENT_CLINIC_OR_DEPARTMENT_OTHER): Payer: Medicare Other

## 2013-03-22 ENCOUNTER — Ambulatory Visit (HOSPITAL_BASED_OUTPATIENT_CLINIC_OR_DEPARTMENT_OTHER): Payer: Medicare Other | Admitting: Internal Medicine

## 2013-03-22 VITALS — BP 146/71 | HR 71 | Temp 97.9°F | Resp 19 | Ht 73.0 in | Wt 181.0 lb

## 2013-03-22 DIAGNOSIS — C9000 Multiple myeloma not having achieved remission: Secondary | ICD-10-CM

## 2013-03-22 LAB — CBC WITH DIFFERENTIAL/PLATELET
BASO%: 0.3 % (ref 0.0–2.0)
Basophils Absolute: 0 10*3/uL (ref 0.0–0.1)
EOS ABS: 0 10*3/uL (ref 0.0–0.5)
EOS%: 0.9 % (ref 0.0–7.0)
HCT: 31.3 % — ABNORMAL LOW (ref 38.4–49.9)
HEMOGLOBIN: 9.9 g/dL — AB (ref 13.0–17.1)
LYMPH#: 0.5 10*3/uL — AB (ref 0.9–3.3)
LYMPH%: 15.7 % (ref 14.0–49.0)
MCH: 24.6 pg — ABNORMAL LOW (ref 27.2–33.4)
MCHC: 31.6 g/dL — ABNORMAL LOW (ref 32.0–36.0)
MCV: 77.9 fL — ABNORMAL LOW (ref 79.3–98.0)
MONO#: 0.4 10*3/uL (ref 0.1–0.9)
MONO%: 11 % (ref 0.0–14.0)
NEUT#: 2.4 10*3/uL (ref 1.5–6.5)
NEUT%: 72.1 % (ref 39.0–75.0)
Platelets: 130 10*3/uL — ABNORMAL LOW (ref 140–400)
RBC: 4.02 10*6/uL — ABNORMAL LOW (ref 4.20–5.82)
RDW: 14.8 % — AB (ref 11.0–14.6)
WBC: 3.4 10*3/uL — ABNORMAL LOW (ref 4.0–10.3)
nRBC: 0 % (ref 0–0)

## 2013-03-22 NOTE — Progress Notes (Signed)
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   PRIOR THERAPY: :  1. Status post 6 cycles of systemic chemotherapy with Revlimid and Decadron, last dose was given 07/21/2010 with very good response. 2. 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 [name].  3. maintenance Revlimid at 10 mg by mouth daily status post 2 months. Therapy began 01/18/2011. 4. maintenance Revlimid at 15 mg by mouth daily with prophylactic dose Coumadin at 2 mg by mouth daily. 5. Systemic chemotherapy with Velcade at 1.3 mg per meter squared given on days 1, 4, 8 and 11 and Doxil at 30 mg per meter square given on day 4 and Decadron 40 mg by mouth on weekly basis given every 3 weeks. Status post 4 cycles. 6. Zometa 4 mg IV every 4 weeks.  7. Velcade 1.3 mg/M2 subcutaneous daily on a weekly basis with Decadron 20 mg by mouth on a weekly basis. First cycle expected on 06/21/2012. S/P 4 cycles. 8. Systemic chemotherapy with Carfilzomib, cyclophosphamide and Decadron, status post 1 cycle. First cycle started on 08/09/2012. He is status post 8 cycles.  CURRENT THERAPY: Observation.    INTERVAL HISTORY: Eddie Thomas 68 y.o. male returns to the clinic today for followup visit. The patient is feeling fine with no specific complaints. He tolerated cycle #8 of his chemotherapy fairly well with no significant adverse effects. He denied having any significant weight loss or night sweats. He has no fever or chills. He has no nausea or vomiting. The patient denied having any significant chest pain, shortness of breath, cough or hemoptysis. He has repeat myeloma panel and he is here today for evaluation and discussion of his lab results and treatment options.  MEDICAL  HISTORY: Past Medical History  Diagnosis Date  . Multiple myeloma   . Diabetes mellitus 07/03/2011  . Hypertension 07/03/2011  . Hyperlipidemia 07/03/2011  . Colon polyps 2012  . Gastric ulcer     ALLERGIES:  has No Known Allergies.  MEDICATIONS:  Current Outpatient Prescriptions  Medication Sig Dispense Refill  . ACCU-CHEK AVIVA PLUS test strip       . aspirin 81 MG tablet Take 81 mg by mouth daily.      [name] dexamethasone (DECADRON) 4 MG tablet 20 Milligram by mouth every week start with the first dose of chemotherapy  80 tablet  2  . ezetimibe (ZETIA) 10 MG tablet Take 10 mg by mouth daily.        . haemophilus B polysaccharide conjugate vaccine (ACTHIB) injection Inject 0.5 mLs into the muscle once.  1 each  0  . lisinopril (PRINIVIL,ZESTRIL) 10 MG tablet Take 5 mg by mouth daily.       . pantoprazole (PROTONIX) 40 MG tablet TAKE ONE TABLET BY MOUTH TWICE DAILY  60 tablet  3  . pioglitazone (ACTOS) 15 MG tablet Take 15 mg by mouth daily.      . simvastatin (ZOCOR) 10 MG tablet Take 10 mg by mouth at bedtime.        . temazepam (RESTORIL) 15 MG capsule Take 1 capsule (15 mg total) by mouth at bedtime as needed for sleep.  30 capsule  0  . valACYclovir (VALTREX) 500 MG tablet Take 1 tablet (500 mg total) by mouth  daily.  30 tablet  3  . prochlorperazine (COMPAZINE) 10 MG tablet Take 1 tablet (10 mg total) by mouth every 6 (six) hours as needed.  30 tablet  0  . VIAGRA 50 MG tablet Take 50 mg by mouth as needed.       No current facility-administered medications for this visit.    SURGICAL HISTORY:  Past Surgical History  Procedure Laterality Date  . Limbal stem cell transplant    . Humerus fracture surgery      right    REVIEW OF SYSTEMS:  Constitutional: negative Eyes: negative Ears, nose, mouth, throat, and face: negative Respiratory: negative Cardiovascular: negative Gastrointestinal: negative Genitourinary:negative Integument/breast: negative Hematologic/lymphatic:  negative Musculoskeletal:negative Neurological: negative Behavioral/Psych: negative Endocrine: negative Allergic/Immunologic: negative   PHYSICAL EXAMINATION: General appearance: alert, cooperative and no distress Head: Normocephalic, without obvious abnormality, atraumatic Neck: no adenopathy, no JVD, supple, symmetrical, trachea midline and thyroid not enlarged, symmetric, no tenderness/mass/nodules Lymph nodes: Cervical, supraclavicular, and axillary nodes normal. Resp: clear to auscultation bilaterally Back: symmetric, no curvature. ROM normal. No CVA tenderness. Cardio: regular rate and rhythm, S1, S2 normal, no murmur, click, rub or gallop GI: soft, non-tender; bowel sounds normal; no masses,  no organomegaly Extremities: extremities normal, atraumatic, no cyanosis or edema Neurologic: Alert and oriented X 3, normal strength and tone. Normal symmetric reflexes. Normal coordination and gait  ECOG PERFORMANCE STATUS: 1 - Symptomatic but completely ambulatory  Blood pressure 146/71, pulse 71, temperature 97.9 F (36.6 C), temperature source Oral, resp. rate 19, height _0  (1.854 m), weight 181 lb (82.101 kg), SpO2 100.00%.  LABORATORY DATA: Lab Results  Component Value Date   WBC 3.4* 03/22/2013   HGB 9.9* 03/22/2013   HCT 31.3* 03/22/2013   MCV 77.9* 03/22/2013   PLT 130* 03/22/2013      Chemistry      Component Value Date/Time   NA 141 03/15/2013 0920   NA 137 03/08/2013 0912   K 3.8 03/15/2013 0920   K 4.1 03/08/2013 0912   CL 103 03/08/2013 0912   CL 103 07/12/2012 0907   CO2 26 03/15/2013 0920   CO2 26 03/08/2013 0912   BUN 9.0 03/15/2013 0920   BUN 10 03/08/2013 0912   CREATININE 0.8 03/15/2013 0920   CREATININE 0.81 03/08/2013 0912      Component Value Date/Time   CALCIUM 9.1 03/15/2013 0920   CALCIUM 8.6 03/08/2013 0912   ALKPHOS 38* 03/15/2013 0920   ALKPHOS 35* 03/08/2013 0912   AST 11 03/15/2013 0920   AST 13 03/08/2013 0912   ALT 13 03/15/2013 0920   ALT 14 03/08/2013 0912    BILITOT 1.39* 03/15/2013 0920   BILITOT 1.2 03/08/2013 0912     Other lab studies: Beta-2 microglobulin 1.4, free kappa light chain 0.03, free lambda light chain 2.17 with a kappa/lambda ratio of 0.01. IgG 200, IgA 345 and IgM less than 5.  RADIOGRAPHIC STUDIES: No results found.  ASSESSMENT AND PLAN: This is a very pleasant 68 years old [place] American male with history of multiple myeloma currently undergoing systemic chemotherapy with Carfilzomib, cyclophosphamide and Decadron status post 8 cycles. The patient is tolerating his treatment fairly well with no significant adverse effects.  His myeloma panel showed significant improvement in his disease was almost normalization of his protein study. I discussed the lab result with the patient and give him a copy of his report. I recommended for the patient to continue on observation for now with repeat myeloma panel in 2 months.  I gave him also the other option of continuous treatment until disease progression but the patient preferred to take a break. He'll continue his treatment with Zometa as scheduled. He was advised to call immediately if he has any concerning symptoms in the interval.  The patient voices understanding of current disease status and treatment options and is in agreement with the current care plan.  All questions were answered. The patient knows to call the clinic with any problems, questions or concerns. We can certainly see the patient much sooner if necessary.  Disclaimer: This note was dictated with voice recognition software. Similar sounding words can inadvertently be transcribed and may not be corrected upon review.

## 2013-03-22 NOTE — Telephone Encounter (Signed)
Gave pt appt for MAy 2015 lab and MD all chemo cancelled per MD

## 2013-03-23 ENCOUNTER — Ambulatory Visit: Payer: Medicare Other

## 2013-03-29 ENCOUNTER — Ambulatory Visit: Payer: Medicare Other

## 2013-03-29 ENCOUNTER — Other Ambulatory Visit: Payer: Medicare Other

## 2013-03-30 ENCOUNTER — Ambulatory Visit: Payer: Medicare Other

## 2013-04-05 ENCOUNTER — Ambulatory Visit: Payer: Medicare Other

## 2013-04-05 ENCOUNTER — Other Ambulatory Visit: Payer: Medicare Other

## 2013-04-06 ENCOUNTER — Ambulatory Visit: Payer: Medicare Other

## 2013-05-23 ENCOUNTER — Other Ambulatory Visit (HOSPITAL_BASED_OUTPATIENT_CLINIC_OR_DEPARTMENT_OTHER): Payer: Medicare Other

## 2013-05-23 ENCOUNTER — Telehealth: Payer: Self-pay | Admitting: Internal Medicine

## 2013-05-23 ENCOUNTER — Telehealth: Payer: Self-pay | Admitting: *Deleted

## 2013-05-23 ENCOUNTER — Encounter: Payer: Self-pay | Admitting: Internal Medicine

## 2013-05-23 ENCOUNTER — Ambulatory Visit (HOSPITAL_BASED_OUTPATIENT_CLINIC_OR_DEPARTMENT_OTHER): Payer: Medicare Other | Admitting: Internal Medicine

## 2013-05-23 VITALS — BP 121/69 | HR 64 | Temp 98.8°F | Resp 18 | Ht 73.0 in | Wt 188.6 lb

## 2013-05-23 DIAGNOSIS — C9000 Multiple myeloma not having achieved remission: Secondary | ICD-10-CM

## 2013-05-23 DIAGNOSIS — M25519 Pain in unspecified shoulder: Secondary | ICD-10-CM

## 2013-05-23 LAB — CBC WITH DIFFERENTIAL/PLATELET
BASO%: 0.4 % (ref 0.0–2.0)
Basophils Absolute: 0 10*3/uL (ref 0.0–0.1)
EOS%: 0.9 % (ref 0.0–7.0)
Eosinophils Absolute: 0 10*3/uL (ref 0.0–0.5)
HCT: 30.6 % — ABNORMAL LOW (ref 38.4–49.9)
HGB: 9.6 g/dL — ABNORMAL LOW (ref 13.0–17.1)
LYMPH%: 24.4 % (ref 14.0–49.0)
MCH: 24.3 pg — ABNORMAL LOW (ref 27.2–33.4)
MCHC: 31.4 g/dL — AB (ref 32.0–36.0)
MCV: 77.2 fL — AB (ref 79.3–98.0)
MONO#: 0.3 10*3/uL (ref 0.1–0.9)
MONO%: 9.8 % (ref 0.0–14.0)
NEUT#: 2 10*3/uL (ref 1.5–6.5)
NEUT%: 64.5 % (ref 39.0–75.0)
Platelets: 136 10*3/uL — ABNORMAL LOW (ref 140–400)
RBC: 3.97 10*6/uL — AB (ref 4.20–5.82)
RDW: 14.3 % (ref 11.0–14.6)
WBC: 3.2 10*3/uL — AB (ref 4.0–10.3)
lymph#: 0.8 10*3/uL — ABNORMAL LOW (ref 0.9–3.3)

## 2013-05-23 LAB — COMPREHENSIVE METABOLIC PANEL (CC13)
ALK PHOS: 34 U/L — AB (ref 40–150)
ALT: 10 U/L (ref 0–55)
AST: 22 U/L (ref 5–34)
Albumin: 3.3 g/dL — ABNORMAL LOW (ref 3.5–5.0)
Anion Gap: 10 mEq/L (ref 3–11)
BUN: 10.6 mg/dL (ref 7.0–26.0)
CO2: 24 mEq/L (ref 22–29)
Calcium: 9.4 mg/dL (ref 8.4–10.4)
Chloride: 106 mEq/L (ref 98–109)
Creatinine: 1.1 mg/dL (ref 0.7–1.3)
Glucose: 161 mg/dl — ABNORMAL HIGH (ref 70–140)
Potassium: 4.1 mEq/L (ref 3.5–5.1)
SODIUM: 140 meq/L (ref 136–145)
TOTAL PROTEIN: 7.1 g/dL (ref 6.4–8.3)
Total Bilirubin: 1.01 mg/dL (ref 0.20–1.20)

## 2013-05-23 LAB — LACTATE DEHYDROGENASE (CC13): LDH: 198 U/L (ref 125–245)

## 2013-05-23 NOTE — Telephone Encounter (Signed)
Per staff message and POF I have scheduled appts.  JMW  

## 2013-05-23 NOTE — Telephone Encounter (Signed)
gve the pt his July 2015 appt calendar. Sent michelle a staff message to add the chemo appt for zometa.

## 2013-05-23 NOTE — Progress Notes (Signed)
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   PRIOR THERAPY: :  1. Status post 6 cycles of systemic chemotherapy with Revlimid and Decadron, last dose was given 07/21/2010 with very good response. 2. 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 Ok Thomas.  3. maintenance Revlimid at 10 mg by mouth daily status post 2 months. Therapy began 01/18/2011. 4. maintenance Revlimid at 15 mg by mouth daily with prophylactic dose Coumadin at 2 mg by mouth daily. 5. Systemic chemotherapy with Velcade at 1.3 mg per meter squared given on days 1, 4, 8 and 11 and Doxil at 30 mg per meter square given on day 4 and Decadron 40 mg by mouth on weekly basis given every 3 weeks. Status post 4 cycles. 6. Zometa 4 mg IV every 4 weeks.  7. Velcade 1.3 mg/M2 subcutaneous daily on a weekly basis with Decadron 20 mg by mouth on a weekly basis. First cycle expected on 06/21/2012. S/P 4 cycles. 8. Systemic chemotherapy with Carfilzomib, cyclophosphamide and Decadron, status post 1 cycle. First cycle started on 08/09/2012. He is status post 8 cycles.  CURRENT THERAPY: Zometa every 8 weeks. Next dose 05/27/2013.   INTERVAL HISTORY: Eddie Thomas 68 y.o. male returns to the clinic today for followup visit. The patient is feeling fine with no specific complaints. The patient has been on observation for the last 2 months with no significant complaints except for mild aching pain on the left shoulder. He was seen recently by Dr. supple who assured him that may be arthritis. He denied having any significant weight loss or night sweats. He has no fever or chills. He has no nausea or vomiting. The patient denied having any significant chest pain, shortness of breath,  cough or hemoptysis. He has repeat myeloma panel and he is here today for evaluation and discussion of his lab results and treatment options.  MEDICAL HISTORY: Past Medical History  Diagnosis Date  . Multiple myeloma   . Diabetes mellitus 07/03/2011  . Hypertension 07/03/2011  . Hyperlipidemia 07/03/2011  . Colon polyps 2012  . Gastric ulcer     ALLERGIES:  has No Known Allergies.  MEDICATIONS:  Current Outpatient Prescriptions  Medication Sig Dispense Refill  . aspirin 81 MG tablet Take 81 mg by mouth daily.      [name] ezetimibe (ZETIA) 10 MG tablet Take 10 mg by mouth daily.        . haemophilus B polysaccharide conjugate vaccine (ACTHIB) injection Inject 0.5 mLs into the muscle once.  1 each  0  . lisinopril (PRINIVIL,ZESTRIL) 10 MG tablet Take 5 mg by mouth daily.       . pantoprazole (PROTONIX) 40 MG tablet TAKE ONE TABLET BY MOUTH TWICE DAILY  60 tablet  3  . pioglitazone (ACTOS) 15 MG tablet Take 15 mg by mouth daily.      . simvastatin (ZOCOR) 10 MG tablet Take 10 mg by mouth at bedtime.        . temazepam (RESTORIL) 15 MG capsule Take 1 capsule (15 mg total) by mouth at bedtime as needed for sleep.  30 capsule  0  . VIAGRA 50 MG tablet Take 50 mg by mouth as needed.      [name] dexamethasone (DECADRON) 4 MG  tablet 20 Milligram by mouth every week start with the first dose of chemotherapy  80 tablet  2  . prochlorperazine (COMPAZINE) 10 MG tablet Take 1 tablet (10 mg total) by mouth every 6 (six) hours as needed.  30 tablet  0  . valACYclovir (VALTREX) 500 MG tablet Take 1 tablet (500 mg total) by mouth daily.  30 tablet  3   No current facility-administered medications for this visit.    SURGICAL HISTORY:  Past Surgical History  Procedure Laterality Date  . Limbal stem cell transplant    . Humerus fracture surgery      right    REVIEW OF SYSTEMS:  Constitutional: negative Eyes: negative Ears, nose, mouth, throat, and face: negative Respiratory: negative Cardiovascular:  negative Gastrointestinal: negative Genitourinary:negative Integument/breast: negative Hematologic/lymphatic: negative Musculoskeletal:negative Neurological: negative Behavioral/Psych: negative Endocrine: negative Allergic/Immunologic: negative   PHYSICAL EXAMINATION: General appearance: alert, cooperative and no distress Head: Normocephalic, without obvious abnormality, atraumatic Neck: no adenopathy, no JVD, supple, symmetrical, trachea midline and thyroid not enlarged, symmetric, no tenderness/mass/nodules Lymph nodes: Cervical, supraclavicular, and axillary nodes normal. Resp: clear to auscultation bilaterally Back: symmetric, no curvature. ROM normal. No CVA tenderness. Cardio: regular rate and rhythm, S1, S2 normal, no murmur, click, rub or gallop GI: soft, non-tender; bowel sounds normal; no masses,  no organomegaly Extremities: extremities normal, atraumatic, no cyanosis or edema Neurologic: Alert and oriented X 3, normal strength and tone. Normal symmetric reflexes. Normal coordination and gait  ECOG PERFORMANCE STATUS: 1 - Symptomatic but completely ambulatory  Blood pressure 121/69, pulse 64, temperature 98.8 F (37.1 C), temperature source Oral, resp. rate 18, height _0  (1.854 m), weight 188 lb 9.6 oz (85.548 kg), SpO2 100.00%.  LABORATORY DATA: Lab Results  Component Value Date   WBC 3.2* 05/23/2013   HGB 9.6* 05/23/2013   HCT 30.6* 05/23/2013   MCV 77.2* 05/23/2013   PLT 136* 05/23/2013      Chemistry      Component Value Date/Time   NA 141 03/15/2013 0920   NA 137 03/08/2013 0912   K 3.8 03/15/2013 0920   K 4.1 03/08/2013 0912   CL 103 03/08/2013 0912   CL 103 07/12/2012 0907   CO2 26 03/15/2013 0920   CO2 26 03/08/2013 0912   BUN 9.0 03/15/2013 0920   BUN 10 03/08/2013 0912   CREATININE 0.8 03/15/2013 0920   CREATININE 0.81 03/08/2013 0912      Component Value Date/Time   CALCIUM 9.1 03/15/2013 0920   CALCIUM 8.6 03/08/2013 0912   ALKPHOS 38* 03/15/2013 0920    ALKPHOS 35* 03/08/2013 0912   AST 11 03/15/2013 0920   AST 13 03/08/2013 0912   ALT 13 03/15/2013 0920   ALT 14 03/08/2013 0912   BILITOT 1.39* 03/15/2013 0920   BILITOT 1.2 03/08/2013 0912      RADIOGRAPHIC STUDIES: No results found.  ASSESSMENT AND PLAN: This is a very pleasant 68 years old [place] American male with history of multiple myeloma currently undergoing systemic chemotherapy with Carfilzomib, cyclophosphamide and Decadron status post 8 cycles.  The patient has been observation for the last 2 months.  He has myeloma panel performed earlier today but unfortunately the lab work is still pending. He'll continue on observation for now unless his myeloma panel showed evidence for disease progression. I recommended for the patient to continue on observation for now with repeat myeloma panel in 2 months. He'll continue on Zometa every 2 months. He was advised to call immediately if he has  any concerning symptoms in the interval.  The patient voices understanding of current disease status and treatment options and is in agreement with the current care plan.  All questions were answered. The patient knows to call the clinic with any problems, questions or concerns. We can certainly see the patient much sooner if necessary.  Disclaimer: This note was dictated with voice recognition software. Similar sounding words can inadvertently be transcribed and may not be corrected upon review.

## 2013-05-23 NOTE — Telephone Encounter (Signed)
Pt is aware of the d/t of his zometa appt on 05/27/2013

## 2013-05-24 ENCOUNTER — Other Ambulatory Visit: Payer: Self-pay | Admitting: Internal Medicine

## 2013-05-24 DIAGNOSIS — C9002 Multiple myeloma in relapse: Secondary | ICD-10-CM

## 2013-05-25 LAB — IGG, IGA, IGM
IGA: 1820 mg/dL — AB (ref 68–379)
IgG (Immunoglobin G), Serum: 167 mg/dL — ABNORMAL LOW (ref 650–1600)

## 2013-05-25 LAB — BETA 2 MICROGLOBULIN, SERUM: Beta-2 Microglobulin: 4.19 mg/L — ABNORMAL HIGH (ref <=2.51)

## 2013-05-25 LAB — KAPPA/LAMBDA LIGHT CHAINS
KAPPA LAMBDA RATIO: 0 — AB (ref 0.26–1.65)
Kappa free light chain: 0.03 mg/dL — ABNORMAL LOW (ref 0.33–1.94)
LAMBDA FREE LGHT CHN: 12.5 mg/dL — AB (ref 0.57–2.63)

## 2013-05-26 ENCOUNTER — Telehealth: Payer: Self-pay | Admitting: Internal Medicine

## 2013-05-26 NOTE — Telephone Encounter (Signed)
gv adnprinted appt scehd and avs forpt for May....sed added tx.emailed Aj if ok to see in tx

## 2013-05-27 ENCOUNTER — Ambulatory Visit: Payer: Medicare Other

## 2013-05-31 ENCOUNTER — Other Ambulatory Visit: Payer: Self-pay | Admitting: *Deleted

## 2013-05-31 ENCOUNTER — Encounter (HOSPITAL_COMMUNITY): Payer: Self-pay | Admitting: Pharmacy Technician

## 2013-05-31 ENCOUNTER — Other Ambulatory Visit: Payer: Self-pay | Admitting: Internal Medicine

## 2013-05-31 ENCOUNTER — Ambulatory Visit (HOSPITAL_BASED_OUTPATIENT_CLINIC_OR_DEPARTMENT_OTHER): Payer: Medicare Other

## 2013-05-31 ENCOUNTER — Other Ambulatory Visit (HOSPITAL_BASED_OUTPATIENT_CLINIC_OR_DEPARTMENT_OTHER): Payer: Medicare Other

## 2013-05-31 ENCOUNTER — Other Ambulatory Visit: Payer: Self-pay | Admitting: Radiology

## 2013-05-31 DIAGNOSIS — Z5112 Encounter for antineoplastic immunotherapy: Secondary | ICD-10-CM

## 2013-05-31 DIAGNOSIS — C9000 Multiple myeloma not having achieved remission: Secondary | ICD-10-CM

## 2013-05-31 DIAGNOSIS — C9002 Multiple myeloma in relapse: Secondary | ICD-10-CM

## 2013-05-31 DIAGNOSIS — I878 Other specified disorders of veins: Secondary | ICD-10-CM

## 2013-05-31 LAB — COMPREHENSIVE METABOLIC PANEL (CC13)
ALBUMIN: 3.3 g/dL — AB (ref 3.5–5.0)
ALT: 17 U/L (ref 0–55)
AST: 27 U/L (ref 5–34)
Alkaline Phosphatase: 35 U/L — ABNORMAL LOW (ref 40–150)
Anion Gap: 13 mEq/L — ABNORMAL HIGH (ref 3–11)
BUN: 11.2 mg/dL (ref 7.0–26.0)
CALCIUM: 9.4 mg/dL (ref 8.4–10.4)
CHLORIDE: 105 meq/L (ref 98–109)
CO2: 21 mEq/L — ABNORMAL LOW (ref 22–29)
Creatinine: 1.2 mg/dL (ref 0.7–1.3)
GLUCOSE: 201 mg/dL — AB (ref 70–140)
Potassium: 3.9 mEq/L (ref 3.5–5.1)
SODIUM: 138 meq/L (ref 136–145)
Total Bilirubin: 0.94 mg/dL (ref 0.20–1.20)
Total Protein: 7.4 g/dL (ref 6.4–8.3)

## 2013-05-31 LAB — CBC WITH DIFFERENTIAL/PLATELET
BASO%: 0.3 % (ref 0.0–2.0)
BASOS ABS: 0 10*3/uL (ref 0.0–0.1)
EOS%: 0.6 % (ref 0.0–7.0)
Eosinophils Absolute: 0 10*3/uL (ref 0.0–0.5)
HCT: 29.6 % — ABNORMAL LOW (ref 38.4–49.9)
HGB: 9.4 g/dL — ABNORMAL LOW (ref 13.0–17.1)
LYMPH#: 0.6 10*3/uL — AB (ref 0.9–3.3)
LYMPH%: 18.1 % (ref 14.0–49.0)
MCH: 24.2 pg — ABNORMAL LOW (ref 27.2–33.4)
MCHC: 31.8 g/dL — ABNORMAL LOW (ref 32.0–36.0)
MCV: 76.1 fL — ABNORMAL LOW (ref 79.3–98.0)
MONO#: 0.2 10*3/uL (ref 0.1–0.9)
MONO%: 5.1 % (ref 0.0–14.0)
NEUT#: 2.5 10*3/uL (ref 1.5–6.5)
NEUT%: 75.9 % — AB (ref 39.0–75.0)
Platelets: 115 10*3/uL — ABNORMAL LOW (ref 140–400)
RBC: 3.89 10*6/uL — ABNORMAL LOW (ref 4.20–5.82)
RDW: 14.4 % (ref 11.0–14.6)
WBC: 3.3 10*3/uL — ABNORMAL LOW (ref 4.0–10.3)
nRBC: 0 % (ref 0–0)

## 2013-05-31 MED ORDER — SODIUM CHLORIDE 0.9 % IV SOLN
300.0000 mg/m2 | Freq: Once | INTRAVENOUS | Status: AC
  Administered 2013-05-31: 600 mg via INTRAVENOUS
  Filled 2013-05-31: qty 30

## 2013-05-31 MED ORDER — DEXAMETHASONE SODIUM PHOSPHATE 10 MG/ML IJ SOLN
10.0000 mg | Freq: Once | INTRAMUSCULAR | Status: AC
  Administered 2013-05-31: 10 mg via INTRAVENOUS

## 2013-05-31 MED ORDER — SODIUM CHLORIDE 0.9 % IV SOLN
Freq: Once | INTRAVENOUS | Status: AC
  Administered 2013-05-31: 09:00:00 via INTRAVENOUS

## 2013-05-31 MED ORDER — ONDANSETRON 8 MG/50ML IVPB (CHCC)
8.0000 mg | Freq: Once | INTRAVENOUS | Status: AC
  Administered 2013-05-31: 8 mg via INTRAVENOUS

## 2013-05-31 MED ORDER — ZOLEDRONIC ACID 4 MG/100ML IV SOLN
4.0000 mg | Freq: Once | INTRAVENOUS | Status: AC
  Administered 2013-05-31: 4 mg via INTRAVENOUS
  Filled 2013-05-31: qty 100

## 2013-05-31 MED ORDER — DEXAMETHASONE SODIUM PHOSPHATE 10 MG/ML IJ SOLN
INTRAMUSCULAR | Status: AC
  Filled 2013-05-31: qty 1

## 2013-05-31 MED ORDER — ONDANSETRON 8 MG/NS 50 ML IVPB
INTRAVENOUS | Status: AC
  Filled 2013-05-31: qty 8

## 2013-05-31 MED ORDER — DEXTROSE 5 % IV SOLN
27.0000 mg/m2 | Freq: Once | INTRAVENOUS | Status: AC
  Administered 2013-05-31: 54 mg via INTRAVENOUS
  Filled 2013-05-31: qty 27

## 2013-05-31 NOTE — Patient Instructions (Signed)
[place] [facility] Discharge Instructions for Patients Receiving Chemotherapy  Today you received the following chemotherapy agents KYPROLIS,CYTOXAN To help prevent nausea and vomiting after your treatment, we encourage you to take your nausea medication as prescribed.   If you develop nausea and vomiting that is not controlled by your nausea medication, call the clinic.   BELOW ARE SYMPTOMS THAT SHOULD BE REPORTED IMMEDIATELY:  *FEVER GREATER THAN 100.5 F  *CHILLS WITH OR WITHOUT FEVER  NAUSEA AND VOMITING THAT IS NOT CONTROLLED WITH YOUR NAUSEA MEDICATION  *UNUSUAL SHORTNESS OF BREATH  *UNUSUAL BRUISING OR BLEEDING  TENDERNESS IN MOUTH AND THROAT WITH OR WITHOUT PRESENCE OF ULCERS  *URINARY PROBLEMS  *BOWEL PROBLEMS  UNUSUAL RASH Items with * indicate a potential emergency and should be followed up as soon as possible.  Feel free to call the clinic you have any questions or concerns. The clinic phone number is [phone].

## 2013-06-01 ENCOUNTER — Ambulatory Visit (HOSPITAL_BASED_OUTPATIENT_CLINIC_OR_DEPARTMENT_OTHER): Payer: Medicare Other

## 2013-06-01 VITALS — BP 133/68 | HR 83 | Temp 98.3°F | Resp 18

## 2013-06-01 DIAGNOSIS — C9000 Multiple myeloma not having achieved remission: Secondary | ICD-10-CM

## 2013-06-01 DIAGNOSIS — Z5112 Encounter for antineoplastic immunotherapy: Secondary | ICD-10-CM

## 2013-06-01 DIAGNOSIS — C9002 Multiple myeloma in relapse: Secondary | ICD-10-CM

## 2013-06-01 MED ORDER — DEXAMETHASONE SODIUM PHOSPHATE 10 MG/ML IJ SOLN
INTRAMUSCULAR | Status: AC
  Filled 2013-06-01: qty 1

## 2013-06-01 MED ORDER — ONDANSETRON 8 MG/NS 50 ML IVPB
INTRAVENOUS | Status: AC
  Filled 2013-06-01: qty 8

## 2013-06-01 MED ORDER — DEXAMETHASONE SODIUM PHOSPHATE 10 MG/ML IJ SOLN
10.0000 mg | Freq: Once | INTRAMUSCULAR | Status: AC
  Administered 2013-06-01: 10 mg via INTRAVENOUS

## 2013-06-01 MED ORDER — ONDANSETRON 8 MG/50ML IVPB (CHCC)
8.0000 mg | Freq: Once | INTRAVENOUS | Status: AC
  Administered 2013-06-01: 8 mg via INTRAVENOUS

## 2013-06-01 MED ORDER — SODIUM CHLORIDE 0.9 % IV SOLN: Freq: Once | INTRAVENOUS | Status: DC

## 2013-06-01 MED ORDER — DEXTROSE 5 % IV SOLN
27.0000 mg/m2 | Freq: Once | INTRAVENOUS | Status: AC
  Administered 2013-06-01: 54 mg via INTRAVENOUS
  Filled 2013-06-01: qty 27

## 2013-06-01 MED ORDER — SODIUM CHLORIDE 0.9 % IV SOLN
Freq: Once | INTRAVENOUS | Status: AC
  Administered 2013-06-01: 15:00:00 via INTRAVENOUS

## 2013-06-01 NOTE — Patient Instructions (Signed)
[facility] Discharge Instructions for Patients Receiving Chemotherapy  Today you received the following chemotherapy agents Kyprolis To help prevent nausea and vomiting after your treatment, we encourage you to take your nausea medication as prescribed.  If you develop nausea and vomiting that is not controlled by your nausea medication, call the clinic.   BELOW ARE SYMPTOMS THAT SHOULD BE REPORTED IMMEDIATELY:  *FEVER GREATER THAN 100.5 F  *CHILLS WITH OR WITHOUT FEVER  NAUSEA AND VOMITING THAT IS NOT CONTROLLED WITH YOUR NAUSEA MEDICATION  *UNUSUAL SHORTNESS OF BREATH  *UNUSUAL BRUISING OR BLEEDING  TENDERNESS IN MOUTH AND THROAT WITH OR WITHOUT PRESENCE OF ULCERS  *URINARY PROBLEMS  *BOWEL PROBLEMS  UNUSUAL RASH Items with * indicate a potential emergency and should be followed up as soon as possible.  Feel free to call the clinic you have any questions or concerns. The clinic phone number is [phone].    

## 2013-06-02 ENCOUNTER — Other Ambulatory Visit: Payer: Self-pay | Admitting: Radiology

## 2013-06-03 ENCOUNTER — Encounter (HOSPITAL_COMMUNITY): Payer: Self-pay

## 2013-06-03 ENCOUNTER — Other Ambulatory Visit: Payer: Self-pay | Admitting: Medical Oncology

## 2013-06-03 ENCOUNTER — Other Ambulatory Visit: Payer: Self-pay | Admitting: Internal Medicine

## 2013-06-03 ENCOUNTER — Ambulatory Visit (HOSPITAL_COMMUNITY)
Admission: RE | Admit: 2013-06-03 | Discharge: 2013-06-03 | Disposition: A | Discharge status: Home / Self Care | Payer: Medicare Other | Source: Ambulatory Visit | Attending: Internal Medicine | Admitting: Internal Medicine

## 2013-06-03 DIAGNOSIS — E119 Type 2 diabetes mellitus without complications: Secondary | ICD-10-CM | POA: Insufficient documentation

## 2013-06-03 DIAGNOSIS — I10 Essential (primary) hypertension: Secondary | ICD-10-CM | POA: Diagnosis not present

## 2013-06-03 DIAGNOSIS — C9000 Multiple myeloma not having achieved remission: Secondary | ICD-10-CM | POA: Diagnosis present

## 2013-06-03 DIAGNOSIS — E785 Hyperlipidemia, unspecified: Secondary | ICD-10-CM | POA: Diagnosis not present

## 2013-06-03 DIAGNOSIS — Z860100 Personal history of colon polyps, unspecified: Secondary | ICD-10-CM | POA: Insufficient documentation

## 2013-06-03 DIAGNOSIS — I878 Other specified disorders of veins: Secondary | ICD-10-CM

## 2013-06-03 LAB — CBC WITH DIFFERENTIAL/PLATELET
Basophils Absolute: 0 10*3/uL (ref 0.0–0.1)
Basophils Relative: 0 % (ref 0–1)
EOS ABS: 0 10*3/uL (ref 0.0–0.7)
Eosinophils Relative: 0 % (ref 0–5)
HEMATOCRIT: 26.6 % — AB (ref 39.0–52.0)
Hemoglobin: 8.7 g/dL — ABNORMAL LOW (ref 13.0–17.0)
LYMPHS ABS: 0.6 10*3/uL — AB (ref 0.7–4.0)
Lymphocytes Relative: 27 % (ref 12–46)
MCH: 24.2 pg — AB (ref 26.0–34.0)
MCHC: 32.7 g/dL (ref 30.0–36.0)
MCV: 74.1 fL — ABNORMAL LOW (ref 78.0–100.0)
MONO ABS: 0.5 10*3/uL (ref 0.1–1.0)
Monocytes Relative: 22 % — ABNORMAL HIGH (ref 3–12)
Neutro Abs: 1.1 10*3/uL — ABNORMAL LOW (ref 1.7–7.7)
Neutrophils Relative %: 51 % (ref 43–77)
PLATELETS: 78 10*3/uL — AB (ref 150–400)
RBC: 3.59 MIL/uL — ABNORMAL LOW (ref 4.22–5.81)
RDW: 14.1 % (ref 11.5–15.5)
WBC: 2.2 10*3/uL — ABNORMAL LOW (ref 4.0–10.5)

## 2013-06-03 LAB — BASIC METABOLIC PANEL
BUN: 17 mg/dL (ref 6–23)
CALCIUM: 8.8 mg/dL (ref 8.4–10.5)
CO2: 26 meq/L (ref 19–32)
CREATININE: 1.28 mg/dL (ref 0.50–1.35)
Chloride: 100 mEq/L (ref 96–112)
GFR calc Af Amer: 65 mL/min — ABNORMAL LOW (ref >90)
GFR calc non Af Amer: 56 mL/min — ABNORMAL LOW (ref >90)
GLUCOSE: 93 mg/dL (ref 70–99)
Potassium: 3.7 mEq/L (ref 3.7–5.3)
Sodium: 139 mEq/L (ref 137–147)

## 2013-06-03 LAB — CBC
HEMATOCRIT: 27.2 % — AB (ref 39.0–52.0)
Hemoglobin: 8.7 g/dL — ABNORMAL LOW (ref 13.0–17.0)
MCH: 24.1 pg — ABNORMAL LOW (ref 26.0–34.0)
MCHC: 32 g/dL (ref 30.0–36.0)
MCV: 75.3 fL — AB (ref 78.0–100.0)
PLATELETS: 82 10*3/uL — AB (ref 150–400)
RBC: 3.61 MIL/uL — ABNORMAL LOW (ref 4.22–5.81)
RDW: 14 % (ref 11.5–15.5)
WBC: 2.3 10*3/uL — ABNORMAL LOW (ref 4.0–10.5)

## 2013-06-03 LAB — APTT: aPTT: 28 seconds (ref 24–37)

## 2013-06-03 LAB — PROTIME-INR
INR: 1.06 (ref 0.00–1.49)
Prothrombin Time: 13.6 seconds (ref 11.6–15.2)

## 2013-06-03 MED ORDER — LIDOCAINE HCL 1 % IJ SOLN
INTRAMUSCULAR | Status: AC
  Filled 2013-06-03: qty 20

## 2013-06-03 MED ORDER — MIDAZOLAM HCL 2 MG/2ML IJ SOLN
INTRAMUSCULAR | Status: AC
  Filled 2013-06-03: qty 4

## 2013-06-03 MED ORDER — SODIUM CHLORIDE 0.9 % IV SOLN
Freq: Once | INTRAVENOUS | Status: AC
  Administered 2013-06-03: 500 mL via INTRAVENOUS

## 2013-06-03 MED ORDER — FENTANYL CITRATE 0.05 MG/ML IJ SOLN
INTRAMUSCULAR | Status: AC | PRN
  Administered 2013-06-03: 100 ug via INTRAVENOUS

## 2013-06-03 MED ORDER — MIDAZOLAM HCL 2 MG/2ML IJ SOLN
INTRAMUSCULAR | Status: AC | PRN
  Administered 2013-06-03: 2 mg via INTRAVENOUS

## 2013-06-03 MED ORDER — FENTANYL CITRATE 0.05 MG/ML IJ SOLN
INTRAMUSCULAR | Status: AC
  Filled 2013-06-03: qty 4

## 2013-06-03 MED ORDER — HEPARIN SOD (PORK) LOCK FLUSH 100 UNIT/ML IV SOLN
INTRAVENOUS | Status: AC
  Filled 2013-06-03: qty 5

## 2013-06-03 MED ORDER — LIDOCAINE-EPINEPHRINE (PF) 2 %-1:200000 IJ SOLN
INTRAMUSCULAR | Status: AC
  Filled 2013-06-03: qty 20

## 2013-06-03 MED ORDER — HEPARIN SOD (PORK) LOCK FLUSH 100 UNIT/ML IV SOLN
500.0000 [IU] | Freq: Once | INTRAVENOUS | Status: AC
  Administered 2013-06-03: 500 [IU] via INTRAVENOUS

## 2013-06-03 MED ORDER — CEFAZOLIN SODIUM-DEXTROSE 2-3 GM-% IV SOLR
2.0000 g | Freq: Once | INTRAVENOUS | Status: AC
  Administered 2013-06-03: 2 g via INTRAVENOUS
  Filled 2013-06-03: qty 50

## 2013-06-03 NOTE — H&P (Signed)
Eddie Thomas is an 68 y.o. male.   Chief Complaint: pt diagnosed with Multiple Myeloma 12/2009 Follows with Dr Julien Nordmann New progression per recent marker panel Now scheduled for [facility] a cath placement for chemo therapy  HPI: MM; DM; HLD; HTN  Past Medical History  Diagnosis Date  . Multiple myeloma   . Diabetes mellitus 07/03/2011  . Hypertension 07/03/2011  . Hyperlipidemia 07/03/2011  . Colon polyps 2012  . Gastric ulcer     Past Surgical History  Procedure Laterality Date  . Limbal stem cell transplant    . Humerus fracture surgery      right  . 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 Diabetes Mother   . Hyperlipidemia Mother   . Diabetes Sister   . Diabetes Brother   . Colon cancer Maternal Uncle    Social History:  reports that he has never smoked. He has never used smokeless tobacco. He reports that he does not drink alcohol or use illicit drugs.  Allergies: No Known Allergies   (Not in a hospital admission)  No results found for this or any previous visit (from the past 48 hour(s)). No results found.  Review of Systems  Constitutional: Negative for fever and weight loss.  Respiratory: Negative for shortness of breath.   Cardiovascular: Negative for chest pain.  Gastrointestinal: Negative for nausea, vomiting and abdominal pain.  Musculoskeletal: Positive for back pain and neck pain.  Neurological: Negative for dizziness and weakness.  Psychiatric/Behavioral: Negative for substance abuse.    Blood pressure 127/69, pulse 68, temperature 99.1 F (37.3 C), temperature source Oral, resp. rate 18, height 6' 1"  (1.854 m), weight 83.915 kg (185 lb), SpO2 97.00%. Physical Exam  Constitutional: He is oriented to person, place, and time. He appears well-developed and well-nourished.  Cardiovascular: Normal rate, regular rhythm and normal heart sounds.   No murmur heard. Respiratory: Effort  normal and breath sounds normal. No respiratory distress.  GI: Soft. Bowel sounds are normal. There is no rebound.  Musculoskeletal: Normal range of motion.  Neurological: He is alert and oriented to person, place, and time.  Skin: Skin is warm and dry.  Psychiatric: He has a normal mood and affect. His behavior is normal. Judgment and thought content normal.     Assessment/Plan MM Progression per recent labs Now scheduled for [facility] placement for chemo to begin Tuesday Pt aware of procedure benefits and risks and agreeable to proceed Consent signed and in chart  Lavonia Drafts 06/03/2013, 10:42 AM

## 2013-06-03 NOTE — Procedures (Signed)
Placement of right jugular portacath.  Tip at SVC/RA junction.  No immediate complication.

## 2013-06-03 NOTE — Discharge Instructions (Signed)

## 2013-06-03 NOTE — Telephone Encounter (Signed)
emla cream rx done

## 2013-06-06 LAB — GLUCOSE, CAPILLARY: GLUCOSE-CAPILLARY: 91 mg/dL (ref 70–99)

## 2013-06-07 ENCOUNTER — Other Ambulatory Visit (HOSPITAL_BASED_OUTPATIENT_CLINIC_OR_DEPARTMENT_OTHER): Payer: Medicare Other

## 2013-06-07 ENCOUNTER — Ambulatory Visit (HOSPITAL_BASED_OUTPATIENT_CLINIC_OR_DEPARTMENT_OTHER): Payer: Medicare Other

## 2013-06-07 VITALS — BP 149/67 | HR 60 | Temp 97.0°F | Resp 18

## 2013-06-07 DIAGNOSIS — C9000 Multiple myeloma not having achieved remission: Secondary | ICD-10-CM

## 2013-06-07 DIAGNOSIS — C9002 Multiple myeloma in relapse: Secondary | ICD-10-CM

## 2013-06-07 DIAGNOSIS — Z5112 Encounter for antineoplastic immunotherapy: Secondary | ICD-10-CM

## 2013-06-07 LAB — CBC WITH DIFFERENTIAL/PLATELET
BASO%: 0.7 % (ref 0.0–2.0)
BASOS ABS: 0 10*3/uL (ref 0.0–0.1)
EOS ABS: 0 10*3/uL (ref 0.0–0.5)
EOS%: 0.5 % (ref 0.0–7.0)
HEMATOCRIT: 29.5 % — AB (ref 38.4–49.9)
HEMOGLOBIN: 9.3 g/dL — AB (ref 13.0–17.1)
LYMPH%: 15.1 % (ref 14.0–49.0)
MCH: 24.1 pg — ABNORMAL LOW (ref 27.2–33.4)
MCHC: 31.6 g/dL — ABNORMAL LOW (ref 32.0–36.0)
MCV: 76.4 fL — AB (ref 79.3–98.0)
MONO#: 0.2 10*3/uL (ref 0.1–0.9)
MONO%: 4.6 % (ref 0.0–14.0)
NEUT%: 79.1 % — AB (ref 39.0–75.0)
NEUTROS ABS: 3 10*3/uL (ref 1.5–6.5)
PLATELETS: 89 10*3/uL — AB (ref 140–400)
RBC: 3.87 10*6/uL — ABNORMAL LOW (ref 4.20–5.82)
RDW: 14 % (ref 11.0–14.6)
WBC: 3.8 10*3/uL — ABNORMAL LOW (ref 4.0–10.3)
lymph#: 0.6 10*3/uL — ABNORMAL LOW (ref 0.9–3.3)

## 2013-06-07 LAB — COMPREHENSIVE METABOLIC PANEL (CC13)
ALT: 13 U/L (ref 0–55)
AST: 17 U/L (ref 5–34)
Albumin: 3.2 g/dL — ABNORMAL LOW (ref 3.5–5.0)
Alkaline Phosphatase: 36 U/L — ABNORMAL LOW (ref 40–150)
Anion Gap: 11 mEq/L (ref 3–11)
BILIRUBIN TOTAL: 0.84 mg/dL (ref 0.20–1.20)
BUN: 10.7 mg/dL (ref 7.0–26.0)
CO2: 23 mEq/L (ref 22–29)
Calcium: 9 mg/dL (ref 8.4–10.4)
Chloride: 107 mEq/L (ref 98–109)
Creatinine: 1 mg/dL (ref 0.7–1.3)
GLUCOSE: 114 mg/dL (ref 70–140)
Potassium: 4.3 mEq/L (ref 3.5–5.1)
Sodium: 140 mEq/L (ref 136–145)
Total Protein: 7 g/dL (ref 6.4–8.3)

## 2013-06-07 MED ORDER — SODIUM CHLORIDE 0.9 % IV SOLN
Freq: Once | INTRAVENOUS | Status: AC
  Administered 2013-06-07: 10:00:00 via INTRAVENOUS

## 2013-06-07 MED ORDER — DEXAMETHASONE SODIUM PHOSPHATE 10 MG/ML IJ SOLN
10.0000 mg | Freq: Once | INTRAMUSCULAR | Status: AC
Start: 2013-06-07 — End: 2013-06-07
  Administered 2013-06-07: 10 mg via INTRAVENOUS

## 2013-06-07 MED ORDER — ONDANSETRON 8 MG/50ML IVPB (CHCC)
8.0000 mg | Freq: Once | INTRAVENOUS | Status: AC
  Administered 2013-06-07: 8 mg via INTRAVENOUS

## 2013-06-07 MED ORDER — SODIUM CHLORIDE 0.9 % IJ SOLN
10.0000 mL | INTRAMUSCULAR | Status: DC | PRN
  Administered 2013-06-07: 10 mL
  Filled 2013-06-07: qty 10

## 2013-06-07 MED ORDER — DEXAMETHASONE SODIUM PHOSPHATE 10 MG/ML IJ SOLN
INTRAMUSCULAR | Status: AC
  Filled 2013-06-07: qty 1

## 2013-06-07 MED ORDER — CARFILZOMIB CHEMO INJECTION 60 MG
27.0000 mg/m2 | Freq: Once | INTRAVENOUS | Status: AC
  Administered 2013-06-07: 54 mg via INTRAVENOUS
  Filled 2013-06-07: qty 27

## 2013-06-07 MED ORDER — SODIUM CHLORIDE 0.9 % IV SOLN
300.0000 mg/m2 | Freq: Once | INTRAVENOUS | Status: AC
  Administered 2013-06-07: 600 mg via INTRAVENOUS
  Filled 2013-06-07: qty 30

## 2013-06-07 MED ORDER — HEPARIN SOD (PORK) LOCK FLUSH 100 UNIT/ML IV SOLN
500.0000 [IU] | Freq: Once | INTRAVENOUS | Status: AC | PRN
  Administered 2013-06-07: 500 [IU]
  Filled 2013-06-07: qty 5

## 2013-06-07 MED ORDER — ONDANSETRON 8 MG/NS 50 ML IVPB
INTRAVENOUS | Status: AC
  Filled 2013-06-07: qty 8

## 2013-06-07 NOTE — Patient Instructions (Signed)
[facility] Discharge Instructions for Patients Receiving Chemotherapy  Today you received the following chemotherapy agents: Kyprolis, Cytoxan  To help prevent nausea and vomiting after your treatment, we encourage you to take your nausea medication as prescribed.   If you develop nausea and vomiting that is not controlled by your nausea medication, call the clinic.   BELOW ARE SYMPTOMS THAT SHOULD BE REPORTED IMMEDIATELY:  *FEVER GREATER THAN 100.5 F  *CHILLS WITH OR WITHOUT FEVER  NAUSEA AND VOMITING THAT IS NOT CONTROLLED WITH YOUR NAUSEA MEDICATION  *UNUSUAL SHORTNESS OF BREATH  *UNUSUAL BRUISING OR BLEEDING  TENDERNESS IN MOUTH AND THROAT WITH OR WITHOUT PRESENCE OF ULCERS  *URINARY PROBLEMS  *BOWEL PROBLEMS  UNUSUAL RASH Items with * indicate a potential emergency and should be followed up as soon as possible.  Feel free to call the clinic you have any questions or concerns. The clinic phone number is [phone].    

## 2013-06-07 NOTE — Progress Notes (Signed)
Okay to treat today per Dr. Julien Nordmann with platelet count 89. Cindi Carbon, RN

## 2013-06-08 ENCOUNTER — Ambulatory Visit (HOSPITAL_BASED_OUTPATIENT_CLINIC_OR_DEPARTMENT_OTHER): Payer: Medicare Other

## 2013-06-08 VITALS — BP 131/58 | HR 61 | Temp 97.2°F | Resp 18

## 2013-06-08 DIAGNOSIS — C9002 Multiple myeloma in relapse: Secondary | ICD-10-CM

## 2013-06-08 DIAGNOSIS — Z5112 Encounter for antineoplastic immunotherapy: Secondary | ICD-10-CM

## 2013-06-08 DIAGNOSIS — C9000 Multiple myeloma not having achieved remission: Secondary | ICD-10-CM

## 2013-06-08 MED ORDER — HEPARIN SOD (PORK) LOCK FLUSH 100 UNIT/ML IV SOLN
500.0000 [IU] | Freq: Once | INTRAVENOUS | Status: AC | PRN
  Administered 2013-06-08: 500 [IU]
  Filled 2013-06-08: qty 5

## 2013-06-08 MED ORDER — SODIUM CHLORIDE 0.9 % IJ SOLN
10.0000 mL | INTRAMUSCULAR | Status: DC | PRN
  Administered 2013-06-08: 10 mL
  Filled 2013-06-08: qty 10

## 2013-06-08 MED ORDER — ONDANSETRON 8 MG/50ML IVPB (CHCC)
8.0000 mg | Freq: Once | INTRAVENOUS | Status: AC
  Administered 2013-06-08: 8 mg via INTRAVENOUS

## 2013-06-08 MED ORDER — DEXAMETHASONE SODIUM PHOSPHATE 10 MG/ML IJ SOLN
INTRAMUSCULAR | Status: AC
  Filled 2013-06-08: qty 1

## 2013-06-08 MED ORDER — ONDANSETRON 8 MG/NS 50 ML IVPB
INTRAVENOUS | Status: AC
Start: 2013-06-08 — End: 2013-06-08
  Filled 2013-06-08: qty 8

## 2013-06-08 MED ORDER — DEXAMETHASONE SODIUM PHOSPHATE 10 MG/ML IJ SOLN
10.0000 mg | Freq: Once | INTRAMUSCULAR | Status: AC
  Administered 2013-06-08: 10 mg via INTRAVENOUS

## 2013-06-08 MED ORDER — CARFILZOMIB CHEMO INJECTION 60 MG
27.0000 mg/m2 | Freq: Once | INTRAVENOUS | Status: AC
  Administered 2013-06-08: 54 mg via INTRAVENOUS
  Filled 2013-06-08: qty 27

## 2013-06-08 MED ORDER — SODIUM CHLORIDE 0.9 % IV SOLN
Freq: Once | INTRAVENOUS | Status: AC
  Administered 2013-06-08: 14:00:00 via INTRAVENOUS

## 2013-06-08 NOTE — Patient Instructions (Signed)
[facility] Discharge Instructions for Patients Receiving Chemotherapy  Today you received the following chemotherapy agents Kyprolis.  To help prevent nausea and vomiting after your treatment, we encourage you to take your nausea medication.   If you develop nausea and vomiting that is not controlled by your nausea medication, call the clinic.   BELOW ARE SYMPTOMS THAT SHOULD BE REPORTED IMMEDIATELY:  *FEVER GREATER THAN 100.5 F  *CHILLS WITH OR WITHOUT FEVER  NAUSEA AND VOMITING THAT IS NOT CONTROLLED WITH YOUR NAUSEA MEDICATION  *UNUSUAL SHORTNESS OF BREATH  *UNUSUAL BRUISING OR BLEEDING  TENDERNESS IN MOUTH AND THROAT WITH OR WITHOUT PRESENCE OF ULCERS  *URINARY PROBLEMS  *BOWEL PROBLEMS  UNUSUAL RASH Items with * indicate a potential emergency and should be followed up as soon as possible.  Feel free to call the clinic you have any questions or concerns. The clinic phone number is [phone].    

## 2013-06-14 ENCOUNTER — Telehealth: Payer: Self-pay | Admitting: Internal Medicine

## 2013-06-14 ENCOUNTER — Ambulatory Visit (HOSPITAL_BASED_OUTPATIENT_CLINIC_OR_DEPARTMENT_OTHER): Payer: Medicare Other

## 2013-06-14 ENCOUNTER — Ambulatory Visit (HOSPITAL_BASED_OUTPATIENT_CLINIC_OR_DEPARTMENT_OTHER): Payer: Medicare Other | Admitting: Physician Assistant

## 2013-06-14 ENCOUNTER — Other Ambulatory Visit (HOSPITAL_BASED_OUTPATIENT_CLINIC_OR_DEPARTMENT_OTHER): Payer: Medicare Other

## 2013-06-14 ENCOUNTER — Encounter: Payer: Self-pay | Admitting: Physician Assistant

## 2013-06-14 ENCOUNTER — Other Ambulatory Visit: Payer: Medicare Other

## 2013-06-14 VITALS — BP 152/76 | HR 75 | Temp 97.0°F

## 2013-06-14 VITALS — Ht 73.0 in | Wt 193.3 lb

## 2013-06-14 DIAGNOSIS — C9002 Multiple myeloma in relapse: Secondary | ICD-10-CM

## 2013-06-14 DIAGNOSIS — Z5112 Encounter for antineoplastic immunotherapy: Secondary | ICD-10-CM

## 2013-06-14 DIAGNOSIS — Z5111 Encounter for antineoplastic chemotherapy: Secondary | ICD-10-CM

## 2013-06-14 DIAGNOSIS — C9000 Multiple myeloma not having achieved remission: Secondary | ICD-10-CM

## 2013-06-14 LAB — CBC WITH DIFFERENTIAL/PLATELET
BASO%: 0.4 % (ref 0.0–2.0)
Basophils Absolute: 0 10*3/uL (ref 0.0–0.1)
EOS ABS: 0 10*3/uL (ref 0.0–0.5)
EOS%: 0.6 % (ref 0.0–7.0)
HCT: 28.8 % — ABNORMAL LOW (ref 38.4–49.9)
HGB: 9 g/dL — ABNORMAL LOW (ref 13.0–17.1)
LYMPH%: 14.1 % (ref 14.0–49.0)
MCH: 23.9 pg — ABNORMAL LOW (ref 27.2–33.4)
MCHC: 31.2 g/dL — AB (ref 32.0–36.0)
MCV: 76.4 fL — AB (ref 79.3–98.0)
MONO#: 0.2 10*3/uL (ref 0.1–0.9)
MONO%: 6.2 % (ref 0.0–14.0)
NEUT#: 2.9 10*3/uL (ref 1.5–6.5)
NEUT%: 78.7 % — ABNORMAL HIGH (ref 39.0–75.0)
PLATELETS: 103 10*3/uL — AB (ref 140–400)
RBC: 3.76 10*6/uL — ABNORMAL LOW (ref 4.20–5.82)
RDW: 14.3 % (ref 11.0–14.6)
WBC: 3.6 10*3/uL — ABNORMAL LOW (ref 4.0–10.3)
lymph#: 0.5 10*3/uL — ABNORMAL LOW (ref 0.9–3.3)

## 2013-06-14 LAB — COMPREHENSIVE METABOLIC PANEL (CC13)
ALT: 12 U/L (ref 0–55)
AST: 14 U/L (ref 5–34)
Albumin: 3.1 g/dL — ABNORMAL LOW (ref 3.5–5.0)
Alkaline Phosphatase: 34 U/L — ABNORMAL LOW (ref 40–150)
Anion Gap: 11 mEq/L (ref 3–11)
BUN: 11 mg/dL (ref 7.0–26.0)
CO2: 23 mEq/L (ref 22–29)
Calcium: 8.9 mg/dL (ref 8.4–10.4)
Chloride: 107 mEq/L (ref 98–109)
Creatinine: 1 mg/dL (ref 0.7–1.3)
GLUCOSE: 134 mg/dL (ref 70–140)
Potassium: 3.9 mEq/L (ref 3.5–5.1)
SODIUM: 141 meq/L (ref 136–145)
TOTAL PROTEIN: 6.5 g/dL (ref 6.4–8.3)
Total Bilirubin: 1.06 mg/dL (ref 0.20–1.20)

## 2013-06-14 MED ORDER — ONDANSETRON 8 MG/50ML IVPB (CHCC)
8.0000 mg | Freq: Once | INTRAVENOUS | Status: AC
  Administered 2013-06-14: 8 mg via INTRAVENOUS

## 2013-06-14 MED ORDER — HEPARIN SOD (PORK) LOCK FLUSH 100 UNIT/ML IV SOLN
500.0000 [IU] | Freq: Once | INTRAVENOUS | Status: AC | PRN
  Administered 2013-06-14: 500 [IU]
  Filled 2013-06-14: qty 5

## 2013-06-14 MED ORDER — SODIUM CHLORIDE 0.9 % IV SOLN
Freq: Once | INTRAVENOUS | Status: AC
  Administered 2013-06-14: 09:00:00 via INTRAVENOUS

## 2013-06-14 MED ORDER — SODIUM CHLORIDE 0.9 % IV SOLN: Freq: Once | INTRAVENOUS | Status: DC

## 2013-06-14 MED ORDER — SODIUM CHLORIDE 0.9 % IJ SOLN
10.0000 mL | INTRAMUSCULAR | Status: DC | PRN
  Administered 2013-06-14: 10 mL
  Filled 2013-06-14: qty 10

## 2013-06-14 MED ORDER — ONDANSETRON 8 MG/NS 50 ML IVPB
INTRAVENOUS | Status: AC
  Filled 2013-06-14: qty 8

## 2013-06-14 MED ORDER — SODIUM CHLORIDE 0.9 % IV SOLN
300.0000 mg/m2 | Freq: Once | INTRAVENOUS | Status: AC
  Administered 2013-06-14: 600 mg via INTRAVENOUS
  Filled 2013-06-14: qty 30

## 2013-06-14 MED ORDER — DEXAMETHASONE SODIUM PHOSPHATE 10 MG/ML IJ SOLN
INTRAMUSCULAR | Status: AC
  Filled 2013-06-14: qty 1

## 2013-06-14 MED ORDER — DEXAMETHASONE SODIUM PHOSPHATE 10 MG/ML IJ SOLN
10.0000 mg | Freq: Once | INTRAMUSCULAR | Status: AC
  Administered 2013-06-14: 10 mg via INTRAVENOUS

## 2013-06-14 MED ORDER — DEXTROSE 5 % IV SOLN
27.0000 mg/m2 | Freq: Once | INTRAVENOUS | Status: AC
  Administered 2013-06-14: 54 mg via INTRAVENOUS
  Filled 2013-06-14: qty 27

## 2013-06-14 NOTE — Patient Instructions (Signed)
[facility] Discharge Instructions for Patients Receiving Chemotherapy  Today you received the following chemotherapy agents kyprolis/cytoxan.   To help prevent nausea and vomiting after your treatment, we encourage you to take your nausea medication as directed.    If you develop nausea and vomiting that is not controlled by your nausea medication, call the clinic.   BELOW ARE SYMPTOMS THAT SHOULD BE REPORTED IMMEDIATELY:  *FEVER GREATER THAN 100.5 F  *CHILLS WITH OR WITHOUT FEVER  NAUSEA AND VOMITING THAT IS NOT CONTROLLED WITH YOUR NAUSEA MEDICATION  *UNUSUAL SHORTNESS OF BREATH  *UNUSUAL BRUISING OR BLEEDING  TENDERNESS IN MOUTH AND THROAT WITH OR WITHOUT PRESENCE OF ULCERS  *URINARY PROBLEMS  *BOWEL PROBLEMS  UNUSUAL RASH Items with * indicate a potential emergency and should be followed up as soon as possible.  Feel free to call the clinic you have any questions or concerns. The clinic phone number is [phone].  

## 2013-06-14 NOTE — Progress Notes (Addendum)
[facility] [phone]   [phone]  SHARED VISIT PROGRESS NOTE  Eddie Thomas [address] 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   PRIOR THERAPY: :  1. Status post 6 cycles of systemic chemotherapy with Revlimid and Decadron, last dose was given 07/21/2010 with very good response. 2. 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 [name].  3. maintenance Revlimid at 10 mg by mouth daily status post 2 months. Therapy began 01/18/2011. 4. maintenance Revlimid at 15 mg by mouth daily with prophylactic dose Coumadin at 2 mg by mouth daily. 5. Systemic chemotherapy with Velcade at 1.3 mg per meter squared given on days 1, 4, 8 and 11 and Doxil at 30 mg per meter square given on day 4 and Decadron 40 mg by mouth on weekly basis given every 3 weeks. Status post 4 cycles. 6. Zometa 4 mg IV every 4 weeks.  7. Velcade 1.3 mg/M2 subcutaneous daily on a weekly basis with Decadron 20 mg by mouth on a weekly basis. First cycle expected on 06/21/2012. S/P 4 cycles. 8. Systemic chemotherapy with Carfilzomib, cyclophosphamide and Decadron.  First cycle started on 08/09/2012. He is status post 8 cycles.  CURRENT THERAPY:  1. Systemic chemotherapy with Carfilzomib, cyclophosphamide and Decadron. Status post 8 cycles, as well as days 1, 2, 8 and 9 of cycle #9. 2. Zometa every 8 weeks. Next dose 07/21/2013.   INTERVAL HISTORY: Eddie Thomas 68 y.o. male returns to the clinic today for followup visit. The patient is feeling fine with no specific complaints. His most recent myeloma panel showed evidence for disease progression and he was restarted on his systemic chemotherapy withSystemic chemotherapy with Carfilzomib, cyclophosphamide and Decadron. He has completed days 1, 2, 8 and 9 of cycle #9. He presents today to proceed with  day #15 of cycle #9. He is tolerating this chemotherapy without difficulty. He voices no specific complaints today, except for occasional intermittent left nares bleeding. He states that it is a very brief in nature it tends to occur about every couple of weeks. He feels that it may be related to the pollen and seasonal allergy symptoms. He does report some occasional constipation but he manages this well with over-the-counter preparations. He denied having any significant weight loss or night sweats. He has no fever or chills. He has no nausea or vomiting. The patient denied having any significant chest pain, shortness of breath, cough or hemoptysis.   MEDICAL HISTORY: Past Medical History  Diagnosis Date  . Multiple myeloma   . Diabetes mellitus 07/03/2011  . Hypertension 07/03/2011  . Hyperlipidemia 07/03/2011  . Colon polyps 2012  . Gastric ulcer     ALLERGIES:  has No Known Allergies.  MEDICATIONS:  Current Outpatient Prescriptions  Medication Sig Dispense Refill  . aspirin 81 MG tablet Take 81 mg by mouth daily.      [name] dexamethasone (DECADRON) 4 MG tablet Take 20 mg by mouth as directed. TAKES 5 TABLETS ON FIRST DAY OF CHEMO      . ezetimibe (ZETIA) 10 MG tablet Take 10 mg by mouth every evening.       . lidocaine-prilocaine (EMLA) cream APPLY TO AFFECTED AREA AS DIRECTED  30 g  0  . lisinopril (PRINIVIL,ZESTRIL) 10 MG tablet Take 5 mg by mouth every evening.       [name]  pantoprazole (PROTONIX) 40 MG tablet Take 40 mg by mouth 2 (two) times daily.      . pioglitazone (ACTOS) 15 MG tablet Take 15 mg by mouth daily.      . simvastatin (ZOCOR) 10 MG tablet Take 10 mg by mouth at bedtime.        . temazepam (RESTORIL) 15 MG capsule Take 15 mg by mouth at bedtime as needed for sleep.      . valACYclovir (VALTREX) 500 MG tablet Take 500 mg by mouth daily.      [name] VIAGRA 50 MG tablet Take 50 mg by mouth daily as needed for erectile dysfunction.       . prochlorperazine (COMPAZINE) 10 MG tablet  Take 10 mg by mouth every 6 (six) hours as needed for nausea or vomiting.       No current facility-administered medications for this visit.    SURGICAL HISTORY:  Past Surgical History  Procedure Laterality Date  . Limbal stem cell transplant    . Humerus fracture surgery      right  . 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    REVIEW OF SYSTEMS:  Constitutional: negative Eyes: negative Ears, nose, mouth, throat, and face: positive for epistaxis and involving left nares, intermittent and brief Respiratory: negative Cardiovascular: negative Gastrointestinal: negative Genitourinary:negative Integument/breast: negative Hematologic/lymphatic: negative Musculoskeletal:negative Neurological: negative Behavioral/Psych: negative Endocrine: negative Allergic/Immunologic: negative   PHYSICAL EXAMINATION: General appearance: alert, cooperative and no distress Head: Normocephalic, without obvious abnormality, atraumatic Neck: no adenopathy, no JVD, supple, symmetrical, trachea midline and thyroid not enlarged, symmetric, no tenderness/mass/nodules Lymph nodes: Cervical, supraclavicular, and axillary nodes normal. Resp: clear to auscultation bilaterally Back: symmetric, no curvature. ROM normal. No CVA tenderness. Cardio: regular rate and rhythm, S1, S2 normal, no murmur, click, rub or gallop GI: soft, non-tender; bowel sounds normal; no masses,  no organomegaly Extremities: extremities normal, atraumatic, no cyanosis or edema Neurologic: Alert and oriented X 3, normal strength and tone. Normal symmetric reflexes. Normal coordination and gait  ECOG PERFORMANCE STATUS: 1 - Symptomatic but completely ambulatory  Height 6' 1"  (1.854 m), weight 193 lb 4.8 oz (87.68 kg).  LABORATORY DATA: Lab Results  Component Value Date   WBC 3.6* 06/14/2013   HGB 9.0* 06/14/2013   HCT 28.8* 06/14/2013   MCV 76.4* 06/14/2013   PLT 103* 06/14/2013      Chemistry      Component  Value Date/Time   NA 141 06/14/2013 0810   NA 139 06/03/2013 1002   K 3.9 06/14/2013 0810   K 3.7 06/03/2013 1002   CL 100 06/03/2013 1002   CL 103 07/12/2012 0907   CO2 23 06/14/2013 0810   CO2 26 06/03/2013 1002   BUN 11.0 06/14/2013 0810   BUN 17 06/03/2013 1002   CREATININE 1.0 06/14/2013 0810   CREATININE 1.28 06/03/2013 1002      Component Value Date/Time   CALCIUM 8.9 06/14/2013 0810   CALCIUM 8.8 06/03/2013 1002   ALKPHOS 34* 06/14/2013 0810   ALKPHOS 35* 03/08/2013 0912   AST 14 06/14/2013 0810   AST 13 03/08/2013 0912   ALT 12 06/14/2013 0810   ALT 14 03/08/2013 0912   BILITOT 1.06 06/14/2013 0810   BILITOT 1.2 03/08/2013 0912      RADIOGRAPHIC STUDIES: No results found.  ASSESSMENT AND PLAN: This is a very pleasant 68 years old [place] American male with history of multiple myeloma currently undergoing systemic chemotherapy with Carfilzomib, cyclophosphamide and  Decadron status post 8 cycles.  The patient had been observation for the last 2 months. Unfortunately his most recent myeloma panel showed evidence for disease progression. He has resumed systemic chemotherapy withCarfilzomib, cyclophosphamide and Decadron. He has completed days 1, 2, 8 and 9 of cycle #9. Patient was discussed with an also seen by Dr. Julien Nordmann. He will proceed with days #15 and 16 of cycle #9 as scheduled. He will followup in 2 weeks with the start of cycle #10 for another symptom management visit. He will be next due for his every 2 month Zometa on 07/21/2013.  He was advised to call immediately if he has any concerning symptoms in the interval.  The patient voices understanding of current disease status and treatment options and is in agreement with the current care plan.  All questions were answered. The patient knows to call the clinic with any problems, questions or concerns. We can certainly see the patient much sooner if necessary.  Disclaimer: This note was dictated with voice recognition software. Similar  sounding words can inadvertently be transcribed and may not be corrected upon review.  Carlton Adam, PA-C  ADDENDUM: Hematology/Oncology Attending: I had a face to face encounter with the patient today. I recommended her care plan. This is a very pleasant 68 years old [place] American male with relapsed multiple myeloma. His recent blood work showed evidence for disease progression and I recommended for the patient to resume his treatment with Carfilzomib, Cytoxan and Decadron.. he is currently undergoing cycle #9 and tolerating his treatment fairly well. I recommended for the patient to continue his treatment today as scheduled. He would come back for followup visit in 3 weeks with the start of cycle #10. He'll also continue on Zometa every 2 months for bone disease. He was advised to call immediately if he has any concerning symptoms in the interval.  Disclaimer: This note was dictated with voice recognition software. Similar sounding words can inadvertently be transcribed and may not be corrected upon review. Curt Bears, MD 06/14/2013

## 2013-06-14 NOTE — Patient Instructions (Signed)
Continue labs and chemotherapy as scheduled  Follow up in 2 weeks 

## 2013-06-14 NOTE — Telephone Encounter (Signed)
gv adnprinted appt scehda dna vs for pt for May adn June....sed added tx.

## 2013-06-15 ENCOUNTER — Ambulatory Visit (HOSPITAL_BASED_OUTPATIENT_CLINIC_OR_DEPARTMENT_OTHER): Payer: Medicare Other

## 2013-06-15 VITALS — BP 126/70 | HR 73 | Temp 97.9°F | Resp 18

## 2013-06-15 DIAGNOSIS — C9002 Multiple myeloma in relapse: Secondary | ICD-10-CM

## 2013-06-15 DIAGNOSIS — Z5112 Encounter for antineoplastic immunotherapy: Secondary | ICD-10-CM

## 2013-06-15 DIAGNOSIS — C9000 Multiple myeloma not having achieved remission: Secondary | ICD-10-CM

## 2013-06-15 MED ORDER — DEXAMETHASONE SODIUM PHOSPHATE 10 MG/ML IJ SOLN
10.0000 mg | Freq: Once | INTRAMUSCULAR | Status: AC
  Administered 2013-06-15: 10 mg via INTRAVENOUS

## 2013-06-15 MED ORDER — SODIUM CHLORIDE 0.9 % IJ SOLN
10.0000 mL | INTRAMUSCULAR | Status: DC | PRN
  Administered 2013-06-15: 10 mL
  Filled 2013-06-15: qty 10

## 2013-06-15 MED ORDER — DEXTROSE 5 % IV SOLN
27.0000 mg/m2 | Freq: Once | INTRAVENOUS | Status: AC
  Administered 2013-06-15: 54 mg via INTRAVENOUS
  Filled 2013-06-15: qty 27

## 2013-06-15 MED ORDER — SODIUM CHLORIDE 0.9 % IV SOLN
Freq: Once | INTRAVENOUS | Status: AC
  Administered 2013-06-15: 14:00:00 via INTRAVENOUS

## 2013-06-15 MED ORDER — HEPARIN SOD (PORK) LOCK FLUSH 100 UNIT/ML IV SOLN
500.0000 [IU] | Freq: Once | INTRAVENOUS | Status: AC | PRN
  Administered 2013-06-15: 500 [IU]
  Filled 2013-06-15: qty 5

## 2013-06-15 MED ORDER — ONDANSETRON 8 MG/50ML IVPB (CHCC)
8.0000 mg | Freq: Once | INTRAVENOUS | Status: AC
  Administered 2013-06-15: 8 mg via INTRAVENOUS

## 2013-06-15 NOTE — Patient Instructions (Signed)
[facility] Discharge Instructions for Patients Receiving Chemotherapy  Today you received the following chemotherapy agents Kyprolis To help prevent nausea and vomiting after your treatment, we encourage you to take your nausea medication as prescribed.  If you develop nausea and vomiting that is not controlled by your nausea medication, call the clinic.   BELOW ARE SYMPTOMS THAT SHOULD BE REPORTED IMMEDIATELY:  *FEVER GREATER THAN 100.5 F  *CHILLS WITH OR WITHOUT FEVER  NAUSEA AND VOMITING THAT IS NOT CONTROLLED WITH YOUR NAUSEA MEDICATION  *UNUSUAL SHORTNESS OF BREATH  *UNUSUAL BRUISING OR BLEEDING  TENDERNESS IN MOUTH AND THROAT WITH OR WITHOUT PRESENCE OF ULCERS  *URINARY PROBLEMS  *BOWEL PROBLEMS  UNUSUAL RASH Items with * indicate a potential emergency and should be followed up as soon as possible.  Feel free to call the clinic you have any questions or concerns. The clinic phone number is [phone].    

## 2013-06-21 ENCOUNTER — Other Ambulatory Visit (HOSPITAL_BASED_OUTPATIENT_CLINIC_OR_DEPARTMENT_OTHER): Payer: Medicare Other

## 2013-06-21 DIAGNOSIS — C9002 Multiple myeloma in relapse: Secondary | ICD-10-CM

## 2013-06-21 LAB — COMPREHENSIVE METABOLIC PANEL (CC13)
ALBUMIN: 3.4 g/dL — AB (ref 3.5–5.0)
ALK PHOS: 39 U/L — AB (ref 40–150)
ALT: 10 U/L (ref 0–55)
AST: 13 U/L (ref 5–34)
Anion Gap: 13 mEq/L — ABNORMAL HIGH (ref 3–11)
BUN: 10.9 mg/dL (ref 7.0–26.0)
CO2: 21 mEq/L — ABNORMAL LOW (ref 22–29)
Calcium: 8.9 mg/dL (ref 8.4–10.4)
Chloride: 106 mEq/L (ref 98–109)
Creatinine: 1 mg/dL (ref 0.7–1.3)
Glucose: 111 mg/dl (ref 70–140)
POTASSIUM: 4 meq/L (ref 3.5–5.1)
SODIUM: 140 meq/L (ref 136–145)
TOTAL PROTEIN: 6.6 g/dL (ref 6.4–8.3)
Total Bilirubin: 1.32 mg/dL — ABNORMAL HIGH (ref 0.20–1.20)

## 2013-06-21 LAB — CBC WITH DIFFERENTIAL/PLATELET
BASO%: 0.3 % (ref 0.0–2.0)
Basophils Absolute: 0 10*3/uL (ref 0.0–0.1)
EOS%: 0.5 % (ref 0.0–7.0)
Eosinophils Absolute: 0 10*3/uL (ref 0.0–0.5)
HCT: 30.9 % — ABNORMAL LOW (ref 38.4–49.9)
HGB: 9.6 g/dL — ABNORMAL LOW (ref 13.0–17.1)
LYMPH#: 0.6 10*3/uL — AB (ref 0.9–3.3)
LYMPH%: 19.3 % (ref 14.0–49.0)
MCH: 23.8 pg — AB (ref 27.2–33.4)
MCHC: 31.2 g/dL — ABNORMAL LOW (ref 32.0–36.0)
MCV: 76.1 fL — ABNORMAL LOW (ref 79.3–98.0)
MONO#: 0.3 10*3/uL (ref 0.1–0.9)
MONO%: 9.3 % (ref 0.0–14.0)
NEUT%: 70.6 % (ref 39.0–75.0)
NEUTROS ABS: 2.1 10*3/uL (ref 1.5–6.5)
Platelets: 115 10*3/uL — ABNORMAL LOW (ref 140–400)
RBC: 4.06 10*6/uL — AB (ref 4.20–5.82)
RDW: 14.7 % — AB (ref 11.0–14.6)
WBC: 3 10*3/uL — ABNORMAL LOW (ref 4.0–10.3)

## 2013-06-28 ENCOUNTER — Encounter: Payer: Self-pay | Admitting: Physician Assistant

## 2013-06-28 ENCOUNTER — Ambulatory Visit (HOSPITAL_BASED_OUTPATIENT_CLINIC_OR_DEPARTMENT_OTHER): Payer: Medicare Other | Admitting: Physician Assistant

## 2013-06-28 ENCOUNTER — Ambulatory Visit (HOSPITAL_BASED_OUTPATIENT_CLINIC_OR_DEPARTMENT_OTHER): Payer: Medicare Other

## 2013-06-28 ENCOUNTER — Telehealth: Payer: Self-pay | Admitting: *Deleted

## 2013-06-28 ENCOUNTER — Telehealth: Payer: Self-pay | Admitting: Internal Medicine

## 2013-06-28 ENCOUNTER — Other Ambulatory Visit (HOSPITAL_BASED_OUTPATIENT_CLINIC_OR_DEPARTMENT_OTHER): Payer: Medicare Other

## 2013-06-28 VITALS — BP 137/87 | HR 68 | Temp 97.8°F | Resp 19 | Ht 73.0 in | Wt 187.9 lb

## 2013-06-28 DIAGNOSIS — C9000 Multiple myeloma not having achieved remission: Secondary | ICD-10-CM

## 2013-06-28 DIAGNOSIS — Z5111 Encounter for antineoplastic chemotherapy: Secondary | ICD-10-CM

## 2013-06-28 DIAGNOSIS — C9002 Multiple myeloma in relapse: Secondary | ICD-10-CM

## 2013-06-28 DIAGNOSIS — Z5112 Encounter for antineoplastic immunotherapy: Secondary | ICD-10-CM

## 2013-06-28 LAB — COMPREHENSIVE METABOLIC PANEL (CC13)
ALK PHOS: 41 U/L (ref 40–150)
ALT: 13 U/L (ref 0–55)
AST: 16 U/L (ref 5–34)
Albumin: 3.5 g/dL (ref 3.5–5.0)
Anion Gap: 8 mEq/L (ref 3–11)
BILIRUBIN TOTAL: 0.93 mg/dL (ref 0.20–1.20)
BUN: 14 mg/dL (ref 7.0–26.0)
CO2: 25 mEq/L (ref 22–29)
Calcium: 9.1 mg/dL (ref 8.4–10.4)
Chloride: 106 mEq/L (ref 98–109)
Creatinine: 1.1 mg/dL (ref 0.7–1.3)
Glucose: 140 mg/dl (ref 70–140)
Potassium: 4.8 mEq/L (ref 3.5–5.1)
SODIUM: 139 meq/L (ref 136–145)
TOTAL PROTEIN: 6.9 g/dL (ref 6.4–8.3)

## 2013-06-28 LAB — CBC WITH DIFFERENTIAL/PLATELET
BASO%: 0.7 % (ref 0.0–2.0)
Basophils Absolute: 0 10*3/uL (ref 0.0–0.1)
EOS ABS: 0 10*3/uL (ref 0.0–0.5)
EOS%: 0.2 % (ref 0.0–7.0)
HCT: 32 % — ABNORMAL LOW (ref 38.4–49.9)
HGB: 10 g/dL — ABNORMAL LOW (ref 13.0–17.1)
LYMPH%: 11.3 % — AB (ref 14.0–49.0)
MCH: 23.9 pg — AB (ref 27.2–33.4)
MCHC: 31.3 g/dL — AB (ref 32.0–36.0)
MCV: 76.5 fL — ABNORMAL LOW (ref 79.3–98.0)
MONO#: 0.2 10*3/uL (ref 0.1–0.9)
MONO%: 3.6 % (ref 0.0–14.0)
NEUT#: 3.8 10*3/uL (ref 1.5–6.5)
NEUT%: 84.2 % — ABNORMAL HIGH (ref 39.0–75.0)
PLATELETS: 175 10*3/uL (ref 140–400)
RBC: 4.19 10*6/uL — ABNORMAL LOW (ref 4.20–5.82)
RDW: 15.6 % — ABNORMAL HIGH (ref 11.0–14.6)
WBC: 4.6 10*3/uL (ref 4.0–10.3)
lymph#: 0.5 10*3/uL — ABNORMAL LOW (ref 0.9–3.3)

## 2013-06-28 MED ORDER — SODIUM CHLORIDE 0.9 % IJ SOLN
10.0000 mL | INTRAMUSCULAR | Status: DC | PRN
  Administered 2013-06-28: 10 mL
  Filled 2013-06-28: qty 10

## 2013-06-28 MED ORDER — SODIUM CHLORIDE 0.9 % IV SOLN
Freq: Once | INTRAVENOUS | Status: AC
  Administered 2013-06-28: 12:00:00 via INTRAVENOUS

## 2013-06-28 MED ORDER — DEXAMETHASONE SODIUM PHOSPHATE 10 MG/ML IJ SOLN
INTRAMUSCULAR | Status: AC
  Filled 2013-06-28: qty 1

## 2013-06-28 MED ORDER — DEXTROSE 5 % IV SOLN
27.0000 mg/m2 | Freq: Once | INTRAVENOUS | Status: AC
  Administered 2013-06-28: 54 mg via INTRAVENOUS
  Filled 2013-06-28: qty 27

## 2013-06-28 MED ORDER — ONDANSETRON 8 MG/NS 50 ML IVPB
INTRAVENOUS | Status: AC
  Filled 2013-06-28: qty 8

## 2013-06-28 MED ORDER — SODIUM CHLORIDE 0.9 % IV SOLN
300.0000 mg/m2 | Freq: Once | INTRAVENOUS | Status: AC
  Administered 2013-06-28: 600 mg via INTRAVENOUS
  Filled 2013-06-28: qty 30

## 2013-06-28 MED ORDER — DEXAMETHASONE SODIUM PHOSPHATE 10 MG/ML IJ SOLN
10.0000 mg | Freq: Once | INTRAMUSCULAR | Status: AC
  Administered 2013-06-28: 10 mg via INTRAVENOUS

## 2013-06-28 MED ORDER — ONDANSETRON 8 MG/50ML IVPB (CHCC)
8.0000 mg | Freq: Once | INTRAVENOUS | Status: AC
  Administered 2013-06-28: 8 mg via INTRAVENOUS

## 2013-06-28 MED ORDER — HEPARIN SOD (PORK) LOCK FLUSH 100 UNIT/ML IV SOLN
500.0000 [IU] | Freq: Once | INTRAVENOUS | Status: AC | PRN
  Administered 2013-06-28: 500 [IU]
  Filled 2013-06-28: qty 5

## 2013-06-28 NOTE — Progress Notes (Signed)
Patient is requesting that PAC be left accessed for tomorrow's treatment.  PAC accessed with antimicrobial disc in place.

## 2013-06-28 NOTE — Progress Notes (Signed)
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   PRIOR THERAPY: :  1. Status post 6 cycles of systemic chemotherapy with Revlimid and Decadron, last dose was given 07/21/2010 with very good response. 2. 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 Ok Thomas.  3. maintenance Revlimid at 10 mg by mouth daily status post 2 months. Therapy began 01/18/2011. 4. maintenance Revlimid at 15 mg by mouth daily with prophylactic dose Coumadin at 2 mg by mouth daily. 5. Systemic chemotherapy with Velcade at 1.3 mg per meter squared given on days 1, 4, 8 and 11 and Doxil at 30 mg per meter square given on day 4 and Decadron 40 mg by mouth on weekly basis given every 3 weeks. Status post 4 cycles. 6. Zometa 4 mg IV every 4 weeks.  7. Velcade 1.3 mg/M2 subcutaneous daily on a weekly basis with Decadron 20 mg by mouth on a weekly basis. First cycle expected on 06/21/2012. S/P 4 cycles. 8. Systemic chemotherapy with Carfilzomib, cyclophosphamide and Decadron.  First cycle started on 08/09/2012. He is status post 8 cycles.  CURRENT THERAPY:  1. Systemic chemotherapy with Carfilzomib, cyclophosphamide and Decadron. Status post 9 cycles.  2. Zometa every 8 weeks. Next dose 07/21/2013.   INTERVAL HISTORY: Eddie Thomas 68 y.o. male returns to the clinic today for followup visit. The patient is feeling fine with no specific complaints. His most recent myeloma panel showed evidence for disease progression and he was restarted on his systemic chemotherapy withSystemic chemotherapy with Carfilzomib, cyclophosphamide and Decadron. He presents to proceed with day 1 of cycle #10. He is tolerating this chemotherapy without difficulty. He voices no specific  complaints today, except for occasional constipation. He manages this well with over-the-counter preparations. He denied having any significant weight loss or night sweats. He has no fever or chills. He has no nausea or vomiting. The patient denied having any significant chest pain, shortness of breath, cough or hemoptysis.   MEDICAL HISTORY: Past Medical History  Diagnosis Date  . Multiple myeloma   . Diabetes mellitus 07/03/2011  . Hypertension 07/03/2011  . Hyperlipidemia 07/03/2011  . Colon polyps 2012  . Gastric ulcer     ALLERGIES:  has No Known Allergies.  MEDICATIONS:  Current Outpatient Prescriptions  Medication Sig Dispense Refill  . aspirin 81 MG tablet Take 81 mg by mouth daily.      [name] dexamethasone (DECADRON) 4 MG tablet Take 20 mg by mouth as directed. TAKES 5 TABLETS ON FIRST DAY OF CHEMO      . ezetimibe (ZETIA) 10 MG tablet Take 10 mg by mouth every evening.       . lidocaine-prilocaine (EMLA) cream APPLY TO AFFECTED AREA AS DIRECTED  30 g  0  . lisinopril (PRINIVIL,ZESTRIL) 10 MG tablet Take 5 mg by mouth every evening.       . pantoprazole (PROTONIX) 40 MG tablet Take 40 mg by mouth 2 (two) times daily.      . pioglitazone (ACTOS) 15 MG tablet Take 15 mg by mouth daily.      . prochlorperazine (COMPAZINE) 10 MG tablet Take 10 mg by mouth every 6 (six) hours as needed for nausea or vomiting.      . simvastatin (ZOCOR) 10 MG  tablet Take 10 mg by mouth at bedtime.        . valACYclovir (VALTREX) 500 MG tablet Take 500 mg by mouth daily.      [name] VIAGRA 50 MG tablet Take 50 mg by mouth daily as needed for erectile dysfunction.       . temazepam (RESTORIL) 15 MG capsule Take 15 mg by mouth at bedtime as needed for sleep.       No current facility-administered medications for this visit.    SURGICAL HISTORY:  Past Surgical History  Procedure Laterality Date  . Limbal stem cell transplant    . Humerus fracture surgery      right  . Limbal stem cell transplant  2012     for multiple myeloma    REVIEW OF SYSTEMS:  Constitutional: negative Eyes: negative Ears, nose, mouth, throat, and face: negative Respiratory: negative Cardiovascular: negative Gastrointestinal: positive for constipation Genitourinary:negative Integument/breast: negative Hematologic/lymphatic: negative Musculoskeletal:negative Neurological: negative Behavioral/Psych: negative Endocrine: negative Allergic/Immunologic: negative   PHYSICAL EXAMINATION: General appearance: alert, cooperative and no distress Head: Normocephalic, without obvious abnormality, atraumatic Neck: no adenopathy, no JVD, supple, symmetrical, trachea midline and thyroid not enlarged, symmetric, no tenderness/mass/nodules Lymph nodes: Cervical, supraclavicular, and axillary nodes normal. Resp: clear to auscultation bilaterally Back: symmetric, no curvature. ROM normal. No CVA tenderness. Cardio: regular rate and rhythm, S1, S2 normal, no murmur, click, rub or gallop GI: soft, non-tender; bowel sounds normal; no masses,  no organomegaly Extremities: extremities normal, atraumatic, no cyanosis or edema Neurologic: Alert and oriented X 3, normal strength and tone. Normal symmetric reflexes. Normal coordination and gait  ECOG PERFORMANCE STATUS: 1 - Symptomatic but completely ambulatory  Blood pressure 137/87, pulse 68, temperature 97.8 F (36.6 C), temperature source Oral, resp. rate 19, height 6' 1"  (1.854 m), weight 187 lb 14.4 oz (85.231 kg), SpO2 100.00%.  LABORATORY DATA: Lab Results  Component Value Date   WBC 4.6 06/28/2013   HGB 10.0* 06/28/2013   HCT 32.0* 06/28/2013   MCV 76.5* 06/28/2013   PLT 175 06/28/2013      Chemistry      Component Value Date/Time   NA 139 06/28/2013 1011   NA 139 06/03/2013 1002   K 4.8 06/28/2013 1011   K 3.7 06/03/2013 1002   CL 100 06/03/2013 1002   CL 103 07/12/2012 0907   CO2 25 06/28/2013 1011   CO2 26 06/03/2013 1002   BUN 14.0 06/28/2013 1011   BUN 17 06/03/2013 1002    CREATININE 1.1 06/28/2013 1011   CREATININE 1.28 06/03/2013 1002      Component Value Date/Time   CALCIUM 9.1 06/28/2013 1011   CALCIUM 8.8 06/03/2013 1002   ALKPHOS 41 06/28/2013 1011   ALKPHOS 35* 03/08/2013 0912   AST 16 06/28/2013 1011   AST 13 03/08/2013 0912   ALT 13 06/28/2013 1011   ALT 14 03/08/2013 0912   BILITOT 0.93 06/28/2013 1011   BILITOT 1.2 03/08/2013 0912      RADIOGRAPHIC STUDIES: No results found.  ASSESSMENT AND PLAN: This is a very pleasant 68 years old [place] American male with history of multiple myeloma currently undergoing systemic chemotherapy with Carfilzomib, cyclophosphamide and Decadron status post 8 cycles.  The patient had been observation for the last 2 months. Unfortunately his most recent myeloma panel showed evidence for disease progression. He has resumed systemic chemotherapy withCarfilzomib, cyclophosphamide and Decadron. He has completed cycle #9. Patient was discussed with an also seen by Dr. Julien Nordmann. He will proceed with days  1 and 2 of cycle #10 as scheduled. He'll continue with his labs and chemotherapy as scheduled and return in 4 weeks with the start of cycle #11. After he completes 3 cycles we will do another set of protein studies/myeloma panel to reevaluate his disease. He will be next due for his every 2 month Zometa on 07/21/2013.  He was advised to call immediately if he has any concerning symptoms in the interval.  The patient voices understanding of current disease status and treatment options and is in agreement with the current care plan.  All questions were answered. The patient knows to call the clinic with any problems, questions or concerns. We can certainly see the patient much sooner if necessary.  Disclaimer: This note was dictated with voice recognition software. Similar sounding words can inadvertently be transcribed and may not be corrected upon review.  Carlton Adam PA-C  ADDENDUM: Hematology/Oncology Attending:  I had a face  to face encounter with the patient. I recommended his care plan. This is a very pleasant 68 years old [place] American male with relapsed multiple myeloma. He is currently undergoing systemic chemotherapy with Cardizem, Cytoxan and Decadron status post 9 cycles and he is currently undergoing cycle #10. He is tolerating his treatment fairly well with no significant adverse effects. He has no nausea or vomiting, no fever or chills. I recommended for the patient to proceed with his treatment today as scheduled and he would come back for followup visit in 4 weeks with the next cycle of his treatment. He will continue on treatment with Zometa every 2 months as scheduled. He was advised to call immediately if he has any concerning symptoms in the interval.  Disclaimer: This note was dictated with voice recognition software. Similar sounding words can inadvertently be transcribed and may not be corrected upon review. Eilleen Kempf., MD 07/02/2013

## 2013-06-28 NOTE — Telephone Encounter (Signed)
Gave pt appt for lab,md and chemo for June and July 2015

## 2013-06-28 NOTE — Telephone Encounter (Signed)
Per staff phone call and POF I have schedueld appts.  JMW  

## 2013-06-28 NOTE — Patient Instructions (Signed)
[facility] Discharge Instructions for Patients Receiving Chemotherapy You can use "Press and Seal" saran wrap with your  emla cream. Today you received the following chemotherapy agents kyprolis and cytoxan.  To help prevent nausea and vomiting after your treatment, we encourage you to take your nausea medication zofran.   If you develop nausea and vomiting that is not controlled by your nausea medication, call the clinic.   BELOW ARE SYMPTOMS THAT SHOULD BE REPORTED IMMEDIATELY:  *FEVER GREATER THAN 100.5 F  *CHILLS WITH OR WITHOUT FEVER  NAUSEA AND VOMITING THAT IS NOT CONTROLLED WITH YOUR NAUSEA MEDICATION  *UNUSUAL SHORTNESS OF BREATH  *UNUSUAL BRUISING OR BLEEDING  TENDERNESS IN MOUTH AND THROAT WITH OR WITHOUT PRESENCE OF ULCERS  *URINARY PROBLEMS  *BOWEL PROBLEMS  UNUSUAL RASH Items with * indicate a potential emergency and should be followed up as soon as possible.  Feel free to call the clinic you have any questions or concerns. The clinic phone number is [phone].

## 2013-06-29 ENCOUNTER — Ambulatory Visit (HOSPITAL_BASED_OUTPATIENT_CLINIC_OR_DEPARTMENT_OTHER): Payer: Medicare Other

## 2013-06-29 DIAGNOSIS — C9002 Multiple myeloma in relapse: Secondary | ICD-10-CM

## 2013-06-29 DIAGNOSIS — C9000 Multiple myeloma not having achieved remission: Secondary | ICD-10-CM

## 2013-06-29 DIAGNOSIS — Z5112 Encounter for antineoplastic immunotherapy: Secondary | ICD-10-CM

## 2013-06-29 MED ORDER — DEXAMETHASONE SODIUM PHOSPHATE 10 MG/ML IJ SOLN
10.0000 mg | Freq: Once | INTRAMUSCULAR | Status: AC
  Administered 2013-06-29: 10 mg via INTRAVENOUS

## 2013-06-29 MED ORDER — ONDANSETRON 8 MG/NS 50 ML IVPB
INTRAVENOUS | Status: AC
  Filled 2013-06-29: qty 8

## 2013-06-29 MED ORDER — ONDANSETRON 8 MG/50ML IVPB (CHCC)
8.0000 mg | Freq: Once | INTRAVENOUS | Status: AC
  Administered 2013-06-29: 8 mg via INTRAVENOUS

## 2013-06-29 MED ORDER — SODIUM CHLORIDE 0.9 % IV SOLN
Freq: Once | INTRAVENOUS | Status: AC
  Administered 2013-06-29: 14:00:00 via INTRAVENOUS

## 2013-06-29 MED ORDER — SODIUM CHLORIDE 0.9 % IJ SOLN
10.0000 mL | INTRAMUSCULAR | Status: DC | PRN
  Administered 2013-06-29: 10 mL
  Filled 2013-06-29: qty 10

## 2013-06-29 MED ORDER — HEPARIN SOD (PORK) LOCK FLUSH 100 UNIT/ML IV SOLN
500.0000 [IU] | Freq: Once | INTRAVENOUS | Status: AC | PRN
  Administered 2013-06-29: 500 [IU]
  Filled 2013-06-29: qty 5

## 2013-06-29 MED ORDER — DEXTROSE 5 % IV SOLN
27.0000 mg/m2 | Freq: Once | INTRAVENOUS | Status: AC
  Administered 2013-06-29: 54 mg via INTRAVENOUS
  Filled 2013-06-29: qty 27

## 2013-06-29 MED ORDER — DEXAMETHASONE SODIUM PHOSPHATE 10 MG/ML IJ SOLN
INTRAMUSCULAR | Status: AC
  Filled 2013-06-29: qty 1

## 2013-06-29 NOTE — Patient Instructions (Signed)
[facility] Discharge Instructions for Patients Receiving Chemotherapy  Today you received the following chemotherapy agents: Kyprolis  To help prevent nausea and vomiting after your treatment, we encourage you to take your nausea medication: as directed.   If you develop nausea and vomiting that is not controlled by your nausea medication, call the clinic.   BELOW ARE SYMPTOMS THAT SHOULD BE REPORTED IMMEDIATELY:  *FEVER GREATER THAN 100.5 F  *CHILLS WITH OR WITHOUT FEVER  NAUSEA AND VOMITING THAT IS NOT CONTROLLED WITH YOUR NAUSEA MEDICATION  *UNUSUAL SHORTNESS OF BREATH  *UNUSUAL BRUISING OR BLEEDING  TENDERNESS IN MOUTH AND THROAT WITH OR WITHOUT PRESENCE OF ULCERS  *URINARY PROBLEMS  *BOWEL PROBLEMS  UNUSUAL RASH Items with * indicate a potential emergency and should be followed up as soon as possible.  Feel free to call the clinic you have any questions or concerns. The clinic phone number is [phone].    

## 2013-06-30 NOTE — Patient Instructions (Signed)
Continue labs and chemotherapy as scheduled Followup in one month prior to the start of your next scheduled cycle of chemotherapy

## 2013-07-05 ENCOUNTER — Ambulatory Visit (HOSPITAL_BASED_OUTPATIENT_CLINIC_OR_DEPARTMENT_OTHER): Payer: Medicare Other

## 2013-07-05 ENCOUNTER — Other Ambulatory Visit (HOSPITAL_BASED_OUTPATIENT_CLINIC_OR_DEPARTMENT_OTHER): Payer: Medicare Other

## 2013-07-05 VITALS — BP 134/66 | HR 59 | Temp 97.1°F | Resp 18

## 2013-07-05 DIAGNOSIS — C9002 Multiple myeloma in relapse: Secondary | ICD-10-CM

## 2013-07-05 DIAGNOSIS — C9000 Multiple myeloma not having achieved remission: Secondary | ICD-10-CM

## 2013-07-05 DIAGNOSIS — Z5111 Encounter for antineoplastic chemotherapy: Secondary | ICD-10-CM

## 2013-07-05 DIAGNOSIS — Z5112 Encounter for antineoplastic immunotherapy: Secondary | ICD-10-CM

## 2013-07-05 LAB — CBC WITH DIFFERENTIAL/PLATELET
BASO%: 0.5 % (ref 0.0–2.0)
BASOS ABS: 0 10*3/uL (ref 0.0–0.1)
EOS%: 0.6 % (ref 0.0–7.0)
Eosinophils Absolute: 0 10*3/uL (ref 0.0–0.5)
HEMATOCRIT: 32.1 % — AB (ref 38.4–49.9)
HEMOGLOBIN: 10.1 g/dL — AB (ref 13.0–17.1)
LYMPH%: 15.3 % (ref 14.0–49.0)
MCH: 24 pg — ABNORMAL LOW (ref 27.2–33.4)
MCHC: 31.4 g/dL — ABNORMAL LOW (ref 32.0–36.0)
MCV: 76.4 fL — AB (ref 79.3–98.0)
MONO#: 0.2 10*3/uL (ref 0.1–0.9)
MONO%: 6.1 % (ref 0.0–14.0)
NEUT%: 77.5 % — AB (ref 39.0–75.0)
NEUTROS ABS: 3 10*3/uL (ref 1.5–6.5)
Platelets: 120 10*3/uL — ABNORMAL LOW (ref 140–400)
RBC: 4.2 10*6/uL (ref 4.20–5.82)
RDW: 15.7 % — ABNORMAL HIGH (ref 11.0–14.6)
WBC: 3.8 10*3/uL — AB (ref 4.0–10.3)
lymph#: 0.6 10*3/uL — ABNORMAL LOW (ref 0.9–3.3)

## 2013-07-05 LAB — COMPREHENSIVE METABOLIC PANEL (CC13)
ALK PHOS: 39 U/L — AB (ref 40–150)
ALT: 13 U/L (ref 0–55)
ANION GAP: 9 meq/L (ref 3–11)
AST: 14 U/L (ref 5–34)
Albumin: 3.5 g/dL (ref 3.5–5.0)
BILIRUBIN TOTAL: 1.17 mg/dL (ref 0.20–1.20)
BUN: 13.8 mg/dL (ref 7.0–26.0)
CO2: 24 meq/L (ref 22–29)
Calcium: 8.8 mg/dL (ref 8.4–10.4)
Chloride: 107 mEq/L (ref 98–109)
Creatinine: 1.1 mg/dL (ref 0.7–1.3)
GLUCOSE: 122 mg/dL (ref 70–140)
Potassium: 4.2 mEq/L (ref 3.5–5.1)
SODIUM: 140 meq/L (ref 136–145)
Total Protein: 6.5 g/dL (ref 6.4–8.3)

## 2013-07-05 MED ORDER — SODIUM CHLORIDE 0.9 % IV SOLN
Freq: Once | INTRAVENOUS | Status: AC
  Administered 2013-07-05: 10:00:00 via INTRAVENOUS

## 2013-07-05 MED ORDER — SODIUM CHLORIDE 0.9 % IV SOLN
300.0000 mg/m2 | Freq: Once | INTRAVENOUS | Status: AC
  Administered 2013-07-05: 600 mg via INTRAVENOUS
  Filled 2013-07-05: qty 30

## 2013-07-05 MED ORDER — SODIUM CHLORIDE 0.9 % IV SOLN: Freq: Once | INTRAVENOUS | Status: DC

## 2013-07-05 MED ORDER — ONDANSETRON 8 MG/NS 50 ML IVPB
INTRAVENOUS | Status: AC
  Filled 2013-07-05: qty 8

## 2013-07-05 MED ORDER — SODIUM CHLORIDE 0.9 % IJ SOLN
10.0000 mL | INTRAMUSCULAR | Status: DC | PRN
Start: 2013-07-05 — End: 2013-07-05
  Administered 2013-07-05: 10 mL
  Filled 2013-07-05: qty 10

## 2013-07-05 MED ORDER — HEPARIN SOD (PORK) LOCK FLUSH 100 UNIT/ML IV SOLN
500.0000 [IU] | Freq: Once | INTRAVENOUS | Status: AC | PRN
  Administered 2013-07-05: 500 [IU]
  Filled 2013-07-05: qty 5

## 2013-07-05 MED ORDER — ONDANSETRON 8 MG/50ML IVPB (CHCC)
8.0000 mg | Freq: Once | INTRAVENOUS | Status: AC
  Administered 2013-07-05: 8 mg via INTRAVENOUS

## 2013-07-05 MED ORDER — DEXAMETHASONE SODIUM PHOSPHATE 10 MG/ML IJ SOLN
INTRAMUSCULAR | Status: AC
  Filled 2013-07-05: qty 1

## 2013-07-05 MED ORDER — DEXTROSE 5 % IV SOLN
27.0000 mg/m2 | Freq: Once | INTRAVENOUS | Status: AC
  Administered 2013-07-05: 54 mg via INTRAVENOUS
  Filled 2013-07-05: qty 27

## 2013-07-05 MED ORDER — DEXAMETHASONE SODIUM PHOSPHATE 10 MG/ML IJ SOLN
10.0000 mg | Freq: Once | INTRAMUSCULAR | Status: AC
  Administered 2013-07-05: 10 mg via INTRAVENOUS

## 2013-07-06 ENCOUNTER — Ambulatory Visit (HOSPITAL_BASED_OUTPATIENT_CLINIC_OR_DEPARTMENT_OTHER): Payer: Medicare Other

## 2013-07-06 VITALS — BP 121/64 | HR 85 | Temp 97.9°F | Resp 18

## 2013-07-06 DIAGNOSIS — C9002 Multiple myeloma in relapse: Secondary | ICD-10-CM

## 2013-07-06 DIAGNOSIS — C9000 Multiple myeloma not having achieved remission: Secondary | ICD-10-CM

## 2013-07-06 DIAGNOSIS — Z5112 Encounter for antineoplastic immunotherapy: Secondary | ICD-10-CM

## 2013-07-06 MED ORDER — ONDANSETRON 8 MG/50ML IVPB (CHCC)
8.0000 mg | Freq: Once | INTRAVENOUS | Status: AC
  Administered 2013-07-06: 8 mg via INTRAVENOUS

## 2013-07-06 MED ORDER — SODIUM CHLORIDE 0.9 % IV SOLN
Freq: Once | INTRAVENOUS | Status: AC
  Administered 2013-07-06: 14:00:00 via INTRAVENOUS

## 2013-07-06 MED ORDER — SODIUM CHLORIDE 0.9 % IJ SOLN
10.0000 mL | INTRAMUSCULAR | Status: DC | PRN
  Administered 2013-07-06: 10 mL
  Filled 2013-07-06: qty 10

## 2013-07-06 MED ORDER — DEXAMETHASONE SODIUM PHOSPHATE 10 MG/ML IJ SOLN
10.0000 mg | Freq: Once | INTRAMUSCULAR | Status: AC
  Administered 2013-07-06: 10 mg via INTRAVENOUS

## 2013-07-06 MED ORDER — DEXTROSE 5 % IV SOLN
27.0000 mg/m2 | Freq: Once | INTRAVENOUS | Status: AC
  Administered 2013-07-06: 54 mg via INTRAVENOUS
  Filled 2013-07-06: qty 27

## 2013-07-06 MED ORDER — ONDANSETRON 8 MG/NS 50 ML IVPB
INTRAVENOUS | Status: AC
  Filled 2013-07-06: qty 8

## 2013-07-06 MED ORDER — HEPARIN SOD (PORK) LOCK FLUSH 100 UNIT/ML IV SOLN
500.0000 [IU] | Freq: Once | INTRAVENOUS | Status: AC | PRN
  Administered 2013-07-06: 500 [IU]
  Filled 2013-07-06: qty 5

## 2013-07-06 MED ORDER — DEXAMETHASONE SODIUM PHOSPHATE 10 MG/ML IJ SOLN
INTRAMUSCULAR | Status: AC
  Filled 2013-07-06: qty 1

## 2013-07-06 NOTE — Patient Instructions (Signed)
[facility] Discharge Instructions for Patients Receiving Chemotherapy  Today you received the following chemotherapy agents Kyprolis To help prevent nausea and vomiting after your treatment, we encourage you to take your nausea medication as prescribed.  If you develop nausea and vomiting that is not controlled by your nausea medication, call the clinic.   BELOW ARE SYMPTOMS THAT SHOULD BE REPORTED IMMEDIATELY:  *FEVER GREATER THAN 100.5 F  *CHILLS WITH OR WITHOUT FEVER  NAUSEA AND VOMITING THAT IS NOT CONTROLLED WITH YOUR NAUSEA MEDICATION  *UNUSUAL SHORTNESS OF BREATH  *UNUSUAL BRUISING OR BLEEDING  TENDERNESS IN MOUTH AND THROAT WITH OR WITHOUT PRESENCE OF ULCERS  *URINARY PROBLEMS  *BOWEL PROBLEMS  UNUSUAL RASH Items with * indicate a potential emergency and should be followed up as soon as possible.  Feel free to call the clinic you have any questions or concerns. The clinic phone number is [phone].    

## 2013-07-07 ENCOUNTER — Other Ambulatory Visit: Payer: Self-pay | Admitting: Gastroenterology

## 2013-07-12 ENCOUNTER — Ambulatory Visit (HOSPITAL_BASED_OUTPATIENT_CLINIC_OR_DEPARTMENT_OTHER): Payer: Medicare Other

## 2013-07-12 ENCOUNTER — Other Ambulatory Visit (HOSPITAL_BASED_OUTPATIENT_CLINIC_OR_DEPARTMENT_OTHER): Payer: Medicare Other

## 2013-07-12 VITALS — BP 143/73 | HR 61 | Temp 97.9°F | Resp 18

## 2013-07-12 DIAGNOSIS — C9002 Multiple myeloma in relapse: Secondary | ICD-10-CM

## 2013-07-12 DIAGNOSIS — Z5112 Encounter for antineoplastic immunotherapy: Secondary | ICD-10-CM

## 2013-07-12 DIAGNOSIS — C9000 Multiple myeloma not having achieved remission: Secondary | ICD-10-CM

## 2013-07-12 DIAGNOSIS — Z5111 Encounter for antineoplastic chemotherapy: Secondary | ICD-10-CM

## 2013-07-12 LAB — COMPREHENSIVE METABOLIC PANEL (CC13)
ALBUMIN: 3.4 g/dL — AB (ref 3.5–5.0)
ALT: 11 U/L (ref 0–55)
AST: 13 U/L (ref 5–34)
Alkaline Phosphatase: 40 U/L (ref 40–150)
Anion Gap: 7 mEq/L (ref 3–11)
BUN: 10.1 mg/dL (ref 7.0–26.0)
CHLORIDE: 107 meq/L (ref 98–109)
CO2: 25 mEq/L (ref 22–29)
Calcium: 8.9 mg/dL (ref 8.4–10.4)
Creatinine: 1 mg/dL (ref 0.7–1.3)
GLUCOSE: 119 mg/dL (ref 70–140)
POTASSIUM: 3.9 meq/L (ref 3.5–5.1)
SODIUM: 140 meq/L (ref 136–145)
TOTAL PROTEIN: 6.3 g/dL — AB (ref 6.4–8.3)
Total Bilirubin: 1.13 mg/dL (ref 0.20–1.20)

## 2013-07-12 LAB — CBC WITH DIFFERENTIAL/PLATELET
BASO%: 0.2 % (ref 0.0–2.0)
Basophils Absolute: 0 10*3/uL (ref 0.0–0.1)
EOS ABS: 0 10*3/uL (ref 0.0–0.5)
EOS%: 0.6 % (ref 0.0–7.0)
HCT: 30.8 % — ABNORMAL LOW (ref 38.4–49.9)
HEMOGLOBIN: 9.6 g/dL — AB (ref 13.0–17.1)
LYMPH#: 0.5 10*3/uL — AB (ref 0.9–3.3)
LYMPH%: 10.7 % — ABNORMAL LOW (ref 14.0–49.0)
MCH: 23.6 pg — ABNORMAL LOW (ref 27.2–33.4)
MCHC: 31.1 g/dL — ABNORMAL LOW (ref 32.0–36.0)
MCV: 75.7 fL — AB (ref 79.3–98.0)
MONO#: 0.3 10*3/uL (ref 0.1–0.9)
MONO%: 5.4 % (ref 0.0–14.0)
NEUT%: 83.1 % — ABNORMAL HIGH (ref 39.0–75.0)
NEUTROS ABS: 4.2 10*3/uL (ref 1.5–6.5)
Platelets: 113 10*3/uL — ABNORMAL LOW (ref 140–400)
RBC: 4.07 10*6/uL — ABNORMAL LOW (ref 4.20–5.82)
RDW: 15.8 % — AB (ref 11.0–14.6)
WBC: 5.1 10*3/uL (ref 4.0–10.3)

## 2013-07-12 MED ORDER — DEXAMETHASONE SODIUM PHOSPHATE 10 MG/ML IJ SOLN
10.0000 mg | Freq: Once | INTRAMUSCULAR | Status: AC
  Administered 2013-07-12: 10 mg via INTRAVENOUS

## 2013-07-12 MED ORDER — ONDANSETRON 8 MG/NS 50 ML IVPB
INTRAVENOUS | Status: AC
  Filled 2013-07-12: qty 8

## 2013-07-12 MED ORDER — SODIUM CHLORIDE 0.9 % IV SOLN
Freq: Once | INTRAVENOUS | Status: AC
  Administered 2013-07-12: 10:00:00 via INTRAVENOUS

## 2013-07-12 MED ORDER — HEPARIN SOD (PORK) LOCK FLUSH 100 UNIT/ML IV SOLN
500.0000 [IU] | Freq: Once | INTRAVENOUS | Status: AC | PRN
  Administered 2013-07-12: 500 [IU]
  Filled 2013-07-12: qty 5

## 2013-07-12 MED ORDER — SODIUM CHLORIDE 0.9 % IJ SOLN
10.0000 mL | INTRAMUSCULAR | Status: DC | PRN
  Administered 2013-07-12: 10 mL
  Filled 2013-07-12: qty 10

## 2013-07-12 MED ORDER — ONDANSETRON 8 MG/50ML IVPB (CHCC)
8.0000 mg | Freq: Once | INTRAVENOUS | Status: AC
Start: 2013-07-12 — End: 2013-07-12
  Administered 2013-07-12: 8 mg via INTRAVENOUS

## 2013-07-12 MED ORDER — DEXTROSE 5 % IV SOLN
27.0000 mg/m2 | Freq: Once | INTRAVENOUS | Status: AC
  Administered 2013-07-12: 54 mg via INTRAVENOUS
  Filled 2013-07-12: qty 27

## 2013-07-12 MED ORDER — SODIUM CHLORIDE 0.9 % IV SOLN
300.0000 mg/m2 | Freq: Once | INTRAVENOUS | Status: AC
  Administered 2013-07-12: 600 mg via INTRAVENOUS
  Filled 2013-07-12: qty 30

## 2013-07-12 MED ORDER — DEXAMETHASONE SODIUM PHOSPHATE 10 MG/ML IJ SOLN
INTRAMUSCULAR | Status: AC
  Filled 2013-07-12: qty 1

## 2013-07-12 NOTE — Patient Instructions (Signed)
[facility] Discharge Instructions for Patients Receiving Chemotherapy  Today you received the following chemotherapy agents: Kyprolis, Cytoxan. To help prevent nausea and vomiting after your treatment, we encourage you to take your nausea medication.   If you develop nausea and vomiting that is not controlled by your nausea medication, call the clinic.   BELOW ARE SYMPTOMS THAT SHOULD BE REPORTED IMMEDIATELY:  *FEVER GREATER THAN 100.5 F  *CHILLS WITH OR WITHOUT FEVER  NAUSEA AND VOMITING THAT IS NOT CONTROLLED WITH YOUR NAUSEA MEDICATION  *UNUSUAL SHORTNESS OF BREATH  *UNUSUAL BRUISING OR BLEEDING  TENDERNESS IN MOUTH AND THROAT WITH OR WITHOUT PRESENCE OF ULCERS  *URINARY PROBLEMS  *BOWEL PROBLEMS  UNUSUAL RASH Items with * indicate a potential emergency and should be followed up as soon as possible.  Feel free to call the clinic you have any questions or concerns. The clinic phone number is [phone].

## 2013-07-13 ENCOUNTER — Ambulatory Visit (HOSPITAL_BASED_OUTPATIENT_CLINIC_OR_DEPARTMENT_OTHER): Payer: Medicare Other

## 2013-07-13 VITALS — BP 138/71 | HR 64 | Temp 97.4°F | Resp 18

## 2013-07-13 DIAGNOSIS — C9002 Multiple myeloma in relapse: Secondary | ICD-10-CM

## 2013-07-13 DIAGNOSIS — Z5112 Encounter for antineoplastic immunotherapy: Secondary | ICD-10-CM

## 2013-07-13 DIAGNOSIS — C9000 Multiple myeloma not having achieved remission: Secondary | ICD-10-CM

## 2013-07-13 MED ORDER — DEXAMETHASONE SODIUM PHOSPHATE 10 MG/ML IJ SOLN
10.0000 mg | Freq: Once | INTRAMUSCULAR | Status: AC
  Administered 2013-07-13: 10 mg via INTRAVENOUS

## 2013-07-13 MED ORDER — SODIUM CHLORIDE 0.9 % IV SOLN: Freq: Once | INTRAVENOUS | Status: DC

## 2013-07-13 MED ORDER — ONDANSETRON 8 MG/50ML IVPB (CHCC)
8.0000 mg | Freq: Once | INTRAVENOUS | Status: AC
  Administered 2013-07-13: 8 mg via INTRAVENOUS

## 2013-07-13 MED ORDER — SODIUM CHLORIDE 0.9 % IV SOLN
Freq: Once | INTRAVENOUS | Status: AC
  Administered 2013-07-13: 14:00:00 via INTRAVENOUS

## 2013-07-13 MED ORDER — ONDANSETRON 8 MG/NS 50 ML IVPB
INTRAVENOUS | Status: AC
  Filled 2013-07-13: qty 8

## 2013-07-13 MED ORDER — HEPARIN SOD (PORK) LOCK FLUSH 100 UNIT/ML IV SOLN
500.0000 [IU] | Freq: Once | INTRAVENOUS | Status: AC | PRN
  Administered 2013-07-13: 500 [IU]
  Filled 2013-07-13: qty 5

## 2013-07-13 MED ORDER — SODIUM CHLORIDE 0.9 % IJ SOLN
10.0000 mL | INTRAMUSCULAR | Status: DC | PRN
  Administered 2013-07-13: 10 mL
  Filled 2013-07-13: qty 10

## 2013-07-13 MED ORDER — DEXTROSE 5 % IV SOLN
27.0000 mg/m2 | Freq: Once | INTRAVENOUS | Status: AC
  Administered 2013-07-13: 54 mg via INTRAVENOUS
  Filled 2013-07-13: qty 27

## 2013-07-13 NOTE — Patient Instructions (Signed)
[facility] Discharge Instructions for Patients Receiving Chemotherapy  Today you received the following chemotherapy agents: kyprolis  To help prevent nausea and vomiting after your treatment, we encourage you to take your nausea medication.  Take it as often as prescribed.     If you develop nausea and vomiting that is not controlled by your nausea medication, call the clinic. If it is after clinic hours your family physician or the after hours number for the clinic or go to the Emergency Department.   BELOW ARE SYMPTOMS THAT SHOULD BE REPORTED IMMEDIATELY:  *FEVER GREATER THAN 100.5 F  *CHILLS WITH OR WITHOUT FEVER  NAUSEA AND VOMITING THAT IS NOT CONTROLLED WITH YOUR NAUSEA MEDICATION  *UNUSUAL SHORTNESS OF BREATH  *UNUSUAL BRUISING OR BLEEDING  TENDERNESS IN MOUTH AND THROAT WITH OR WITHOUT PRESENCE OF ULCERS  *URINARY PROBLEMS  *BOWEL PROBLEMS  UNUSUAL RASH Items with * indicate a potential emergency and should be followed up as soon as possible.  Feel free to call the clinic you have any questions or concerns. The clinic phone number is [phone].   I have been informed and understand all the instructions given to me. I know to contact the clinic, my physician, or go to the Emergency Department if any problems should occur. I do not have any questions at this time, but understand that I may call the clinic during office hours   should I have any questions or need assistance in obtaining follow up care.    __________________________________________  _____________  __________ Signature of Patient or Authorized Representative            Date                   Time    __________________________________________ Nurse's Signature    

## 2013-07-19 ENCOUNTER — Other Ambulatory Visit: Payer: Medicare Other

## 2013-07-21 ENCOUNTER — Ambulatory Visit (HOSPITAL_BASED_OUTPATIENT_CLINIC_OR_DEPARTMENT_OTHER): Payer: Medicare Other

## 2013-07-21 ENCOUNTER — Other Ambulatory Visit (HOSPITAL_BASED_OUTPATIENT_CLINIC_OR_DEPARTMENT_OTHER): Payer: Medicare Other

## 2013-07-21 VITALS — BP 150/80 | HR 64 | Temp 97.5°F | Resp 16

## 2013-07-21 DIAGNOSIS — C9000 Multiple myeloma not having achieved remission: Secondary | ICD-10-CM

## 2013-07-21 DIAGNOSIS — C9002 Multiple myeloma in relapse: Secondary | ICD-10-CM

## 2013-07-21 LAB — CBC WITH DIFFERENTIAL/PLATELET
BASO%: 0.4 % (ref 0.0–2.0)
BASOS ABS: 0 10*3/uL (ref 0.0–0.1)
EOS ABS: 0 10*3/uL (ref 0.0–0.5)
EOS%: 0.8 % (ref 0.0–7.0)
HCT: 30.5 % — ABNORMAL LOW (ref 38.4–49.9)
HEMOGLOBIN: 9.8 g/dL — AB (ref 13.0–17.1)
LYMPH#: 0.7 10*3/uL — AB (ref 0.9–3.3)
LYMPH%: 26.4 % (ref 14.0–49.0)
MCH: 23.8 pg — ABNORMAL LOW (ref 27.2–33.4)
MCHC: 32.1 g/dL (ref 32.0–36.0)
MCV: 74.2 fL — AB (ref 79.3–98.0)
MONO#: 0.4 10*3/uL (ref 0.1–0.9)
MONO%: 16.1 % — ABNORMAL HIGH (ref 0.0–14.0)
NEUT%: 56.3 % (ref 39.0–75.0)
NEUTROS ABS: 1.4 10*3/uL — AB (ref 1.5–6.5)
Platelets: 108 10*3/uL — ABNORMAL LOW (ref 140–400)
RBC: 4.11 10*6/uL — ABNORMAL LOW (ref 4.20–5.82)
RDW: 16.4 % — ABNORMAL HIGH (ref 11.0–14.6)
WBC: 2.5 10*3/uL — ABNORMAL LOW (ref 4.0–10.3)
nRBC: 0 % (ref 0–0)

## 2013-07-21 LAB — COMPREHENSIVE METABOLIC PANEL (CC13)
ALBUMIN: 3.5 g/dL (ref 3.5–5.0)
ALT: 14 U/L (ref 0–55)
AST: 14 U/L (ref 5–34)
Alkaline Phosphatase: 37 U/L — ABNORMAL LOW (ref 40–150)
Anion Gap: 8 mEq/L (ref 3–11)
BUN: 11.3 mg/dL (ref 7.0–26.0)
CHLORIDE: 106 meq/L (ref 98–109)
CO2: 25 mEq/L (ref 22–29)
Calcium: 8.9 mg/dL (ref 8.4–10.4)
Creatinine: 1 mg/dL (ref 0.7–1.3)
GLUCOSE: 125 mg/dL (ref 70–140)
POTASSIUM: 4 meq/L (ref 3.5–5.1)
Sodium: 139 mEq/L (ref 136–145)
TOTAL PROTEIN: 6.4 g/dL (ref 6.4–8.3)
Total Bilirubin: 1.2 mg/dL (ref 0.20–1.20)

## 2013-07-21 LAB — LACTATE DEHYDROGENASE (CC13): LDH: 171 U/L (ref 125–245)

## 2013-07-21 MED ORDER — HEPARIN SOD (PORK) LOCK FLUSH 100 UNIT/ML IV SOLN
500.0000 [IU] | Freq: Once | INTRAVENOUS | Status: AC | PRN
  Administered 2013-07-21: 500 [IU]
  Filled 2013-07-21: qty 5

## 2013-07-21 MED ORDER — SODIUM CHLORIDE 0.9 % IJ SOLN
10.0000 mL | INTRAMUSCULAR | Status: DC | PRN
  Administered 2013-07-21: 10 mL
  Filled 2013-07-21: qty 10

## 2013-07-21 MED ORDER — OXYCODONE-ACETAMINOPHEN 5-325 MG PO TABS
1.0000 | ORAL_TABLET | Freq: Once | ORAL | Status: AC
  Administered 2013-07-21: 1 via ORAL

## 2013-07-21 MED ORDER — OXYCODONE-ACETAMINOPHEN 5-325 MG PO TABS
ORAL_TABLET | ORAL | Status: AC
Start: 2013-07-21 — End: 2013-07-21
  Filled 2013-07-21: qty 1

## 2013-07-21 MED ORDER — ZOLEDRONIC ACID 4 MG/100ML IV SOLN
4.0000 mg | Freq: Once | INTRAVENOUS | Status: AC
  Administered 2013-07-21: 4 mg via INTRAVENOUS
  Filled 2013-07-21: qty 100

## 2013-07-21 MED ORDER — OXYCODONE-ACETAMINOPHEN 5-325 MG PO TABS: 1.0000 | ORAL_TABLET | Freq: Four times a day (QID) | ORAL | Status: DC | PRN

## 2013-07-21 MED ORDER — SODIUM CHLORIDE 0.9 % IV SOLN
Freq: Once | INTRAVENOUS | Status: AC
  Administered 2013-07-21: 09:00:00 via INTRAVENOUS

## 2013-07-21 NOTE — Patient Instructions (Signed)

## 2013-07-21 NOTE — Progress Notes (Signed)
Pt reports new lower back pain radiating to right leg limiting weight bearing on that leg. Pt has been trying tylenol and heat at home without complete relief. Dr. Julien Nordmann notified, verbal order obtained for percocet 5/325 with rx for home use. Patient informed and instructed to notify clinic if pain is not relieved, patient informed we may need to image back if pain persists. Pt verbalizes understanding.

## 2013-07-25 ENCOUNTER — Ambulatory Visit: Payer: Medicare Other | Admitting: Internal Medicine

## 2013-07-25 LAB — KAPPA/LAMBDA LIGHT CHAINS
Kappa free light chain: 0.03 mg/dL — ABNORMAL LOW (ref 0.33–1.94)
Kappa:Lambda Ratio: 0 — ABNORMAL LOW (ref 0.26–1.65)
Lambda Free Lght Chn: 8.1 mg/dL — ABNORMAL HIGH (ref 0.57–2.63)

## 2013-07-25 LAB — IGG, IGA, IGM
IgA: 1130 mg/dL — ABNORMAL HIGH (ref 68–379)
IgG (Immunoglobin G), Serum: 179 mg/dL — ABNORMAL LOW (ref 650–1600)

## 2013-07-25 LAB — BETA 2 MICROGLOBULIN, SERUM: Beta-2 Microglobulin: 2.89 mg/L — ABNORMAL HIGH (ref <=2.51)

## 2013-07-26 ENCOUNTER — Telehealth: Payer: Self-pay | Admitting: Internal Medicine

## 2013-07-26 ENCOUNTER — Telehealth: Payer: Self-pay | Admitting: *Deleted

## 2013-07-26 ENCOUNTER — Ambulatory Visit (HOSPITAL_BASED_OUTPATIENT_CLINIC_OR_DEPARTMENT_OTHER): Payer: Medicare Other | Admitting: Internal Medicine

## 2013-07-26 ENCOUNTER — Encounter: Payer: Self-pay | Admitting: Internal Medicine

## 2013-07-26 ENCOUNTER — Ambulatory Visit (HOSPITAL_BASED_OUTPATIENT_CLINIC_OR_DEPARTMENT_OTHER): Payer: Medicare Other

## 2013-07-26 ENCOUNTER — Other Ambulatory Visit (HOSPITAL_BASED_OUTPATIENT_CLINIC_OR_DEPARTMENT_OTHER): Payer: Medicare Other

## 2013-07-26 VITALS — BP 143/72 | HR 63 | Temp 96.9°F | Resp 18 | Ht 73.0 in | Wt 186.2 lb

## 2013-07-26 DIAGNOSIS — Z5112 Encounter for antineoplastic immunotherapy: Secondary | ICD-10-CM

## 2013-07-26 DIAGNOSIS — C9000 Multiple myeloma not having achieved remission: Secondary | ICD-10-CM

## 2013-07-26 DIAGNOSIS — Z9484 Stem cells transplant status: Secondary | ICD-10-CM

## 2013-07-26 DIAGNOSIS — Z5111 Encounter for antineoplastic chemotherapy: Secondary | ICD-10-CM

## 2013-07-26 DIAGNOSIS — C9002 Multiple myeloma in relapse: Secondary | ICD-10-CM

## 2013-07-26 LAB — CBC WITH DIFFERENTIAL/PLATELET
BASO%: 0.7 % (ref 0.0–2.0)
Basophils Absolute: 0 10*3/uL (ref 0.0–0.1)
EOS ABS: 0 10*3/uL (ref 0.0–0.5)
EOS%: 0.9 % (ref 0.0–7.0)
HCT: 31.9 % — ABNORMAL LOW (ref 38.4–49.9)
HEMOGLOBIN: 10 g/dL — AB (ref 13.0–17.1)
LYMPH%: 20 % (ref 14.0–49.0)
MCH: 23.9 pg — ABNORMAL LOW (ref 27.2–33.4)
MCHC: 31.2 g/dL — ABNORMAL LOW (ref 32.0–36.0)
MCV: 76.6 fL — AB (ref 79.3–98.0)
MONO#: 0.4 10*3/uL (ref 0.1–0.9)
MONO%: 12.4 % (ref 0.0–14.0)
NEUT#: 2.3 10*3/uL (ref 1.5–6.5)
NEUT%: 66 % (ref 39.0–75.0)
Platelets: 154 10*3/uL (ref 140–400)
RBC: 4.16 10*6/uL — AB (ref 4.20–5.82)
RDW: 17.3 % — ABNORMAL HIGH (ref 11.0–14.6)
WBC: 3.5 10*3/uL — ABNORMAL LOW (ref 4.0–10.3)
lymph#: 0.7 10*3/uL — ABNORMAL LOW (ref 0.9–3.3)

## 2013-07-26 LAB — COMPREHENSIVE METABOLIC PANEL (CC13)
ALK PHOS: 37 U/L — AB (ref 40–150)
ALT: 14 U/L (ref 0–55)
AST: 16 U/L (ref 5–34)
Albumin: 3.5 g/dL (ref 3.5–5.0)
Anion Gap: 11 mEq/L (ref 3–11)
BILIRUBIN TOTAL: 1.31 mg/dL — AB (ref 0.20–1.20)
BUN: 12.6 mg/dL (ref 7.0–26.0)
CO2: 22 mEq/L (ref 22–29)
Calcium: 9.3 mg/dL (ref 8.4–10.4)
Chloride: 105 mEq/L (ref 98–109)
Creatinine: 1.1 mg/dL (ref 0.7–1.3)
GLUCOSE: 150 mg/dL — AB (ref 70–140)
Potassium: 4.3 mEq/L (ref 3.5–5.1)
SODIUM: 138 meq/L (ref 136–145)
Total Protein: 6.7 g/dL (ref 6.4–8.3)

## 2013-07-26 MED ORDER — HEPARIN SOD (PORK) LOCK FLUSH 100 UNIT/ML IV SOLN
500.0000 [IU] | Freq: Once | INTRAVENOUS | Status: AC | PRN
  Administered 2013-07-26: 500 [IU]
  Filled 2013-07-26: qty 5

## 2013-07-26 MED ORDER — SODIUM CHLORIDE 0.9 % IV SOLN
Freq: Once | INTRAVENOUS | Status: AC
  Administered 2013-07-26: 10:00:00 via INTRAVENOUS

## 2013-07-26 MED ORDER — DEXAMETHASONE SODIUM PHOSPHATE 10 MG/ML IJ SOLN
INTRAMUSCULAR | Status: AC
  Filled 2013-07-26: qty 1

## 2013-07-26 MED ORDER — SODIUM CHLORIDE 0.9 % IJ SOLN
10.0000 mL | INTRAMUSCULAR | Status: DC | PRN
  Administered 2013-07-26: 10 mL
  Filled 2013-07-26: qty 10

## 2013-07-26 MED ORDER — SODIUM CHLORIDE 0.9 % IV SOLN
300.0000 mg/m2 | Freq: Once | INTRAVENOUS | Status: AC
  Administered 2013-07-26: 600 mg via INTRAVENOUS
  Filled 2013-07-26: qty 30

## 2013-07-26 MED ORDER — ONDANSETRON 8 MG/50ML IVPB (CHCC)
8.0000 mg | Freq: Once | INTRAVENOUS | Status: AC
Start: 2013-07-26 — End: 2013-07-26
  Administered 2013-07-26: 8 mg via INTRAVENOUS

## 2013-07-26 MED ORDER — ONDANSETRON 8 MG/NS 50 ML IVPB
INTRAVENOUS | Status: AC
  Filled 2013-07-26: qty 8

## 2013-07-26 MED ORDER — DEXTROSE 5 % IV SOLN
27.0000 mg/m2 | Freq: Once | INTRAVENOUS | Status: AC
  Administered 2013-07-26: 54 mg via INTRAVENOUS
  Filled 2013-07-26: qty 27

## 2013-07-26 MED ORDER — DEXAMETHASONE SODIUM PHOSPHATE 10 MG/ML IJ SOLN
10.0000 mg | Freq: Once | INTRAMUSCULAR | Status: AC
  Administered 2013-07-26: 10 mg via INTRAVENOUS

## 2013-07-26 NOTE — Telephone Encounter (Signed)
Per staff message and POF I have scheduled appts. Advised scheduler of appts. JMW  

## 2013-07-26 NOTE — Telephone Encounter (Signed)
Called pt and left message regarding chemo for tomorrow and advised pt to get appt calendar for July and August

## 2013-07-26 NOTE — Progress Notes (Signed)
Eddie Thomas [phone]) [phone]   Fax:(336) [phone]  OFFICE PROGRESS NOTE  [name] [address] 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   PRIOR THERAPY: :  1. Status post 6 cycles of systemic chemotherapy with Revlimid and Decadron, last dose was given 07/21/2010 with very good response. 2. 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 [name].  3. maintenance Revlimid at 10 mg by mouth daily status post 2 months. Therapy began 01/18/2011. 4. maintenance Revlimid at 15 mg by mouth daily with prophylactic dose Coumadin at 2 mg by mouth daily. 5. Systemic chemotherapy with Velcade at 1.3 mg per meter squared given on days 1, 4, 8 and 11 and Doxil at 30 mg per meter square given on day 4 and Decadron 40 mg by mouth on weekly basis given every 3 weeks. Status post 4 cycles. 6. Zometa 4 mg IV every 4 weeks.  7. Velcade 1.3 mg/M2 subcutaneous daily on a weekly basis with Decadron 20 mg by mouth on a weekly basis. First cycle expected on 06/21/2012. S/P 4 cycles.  CURRENT THERAPY:  1) Systemic chemotherapy with Carfilzomib, cyclophosphamide and Decadron. First cycle started on 08/09/2012. He is status post 10 cycles. 2) Zometa every 8 weeks. Next dose 05/27/2013.   INTERVAL HISTORY: Eddie Thomas 68 y.o. male returns to the clinic today for followup visit. The patient is feeling fine with no specific complaints. He resumed his systemic chemotherapy again with Carfilzomib, cyclophosphamide and Decadron. He is rating his treatment fairly well with no significant adverse effects. The patient recently had low back pain and was started on treatment with Percocet with improvement in his symptoms. He denied having any significant weight loss or night sweats. He has no fever or chills. He has no nausea or vomiting. The patient denied  having any significant chest pain, shortness of breath, cough or hemoptysis. He has repeat myeloma panel and he is here today for evaluation and discussion of his lab results and treatment options.  MEDICAL HISTORY: Past Medical History  Diagnosis Date  . Multiple myeloma   . Diabetes mellitus 07/03/2011  . Hypertension 07/03/2011  . Hyperlipidemia 07/03/2011  . Colon polyps 2012  . Gastric ulcer     ALLERGIES:  has No Known Allergies.  MEDICATIONS:  Current Outpatient Prescriptions  Medication Sig Dispense Refill  . aspirin 81 MG tablet Take 81 mg by mouth daily.      [name] dexamethasone (DECADRON) 4 MG tablet Take 20 mg by mouth as directed. TAKES 5 TABLETS ON FIRST DAY OF CHEMO      . ezetimibe (ZETIA) 10 MG tablet Take 10 mg by mouth every evening.       . lidocaine-prilocaine (EMLA) cream APPLY TO AFFECTED AREA AS DIRECTED  30 g  0  . lisinopril (PRINIVIL,ZESTRIL) 10 MG tablet Take 5 mg by mouth every evening.       [name] oxyCODONE-acetaminophen (PERCOCET/ROXICET) 5-325 MG per tablet Take 1 tablet by mouth every 6 (six) hours as needed for severe pain.  30 tablet  0  . pantoprazole (PROTONIX) 40 MG tablet Take 40 mg by mouth 2 (two) times daily.      . pantoprazole (PROTONIX) 40 MG tablet TAKE ONE TABLET BY MOUTH TWICE DAILY  60 tablet  0  . pioglitazone (ACTOS) 15 MG tablet Take 15 mg by mouth daily.      [name]  prochlorperazine (COMPAZINE) 10 MG tablet Take 10 mg by mouth every 6 (six) hours as needed for nausea or vomiting.      . simvastatin (ZOCOR) 10 MG tablet Take 10 mg by mouth at bedtime.        . temazepam (RESTORIL) 15 MG capsule Take 15 mg by mouth at bedtime as needed for sleep.      . valACYclovir (VALTREX) 500 MG tablet Take 500 mg by mouth daily.      [name] VIAGRA 50 MG tablet Take 50 mg by mouth daily as needed for erectile dysfunction.        No current facility-administered medications for this visit.    SURGICAL HISTORY:  Past Surgical History  Procedure Laterality Date  .  Limbal stem cell transplant    . Humerus fracture surgery      right  . 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    REVIEW OF SYSTEMS:  Constitutional: negative Eyes: negative Ears, nose, mouth, throat, and face: negative Respiratory: negative Cardiovascular: negative Gastrointestinal: negative Genitourinary:negative Integument/breast: negative Hematologic/lymphatic: negative Musculoskeletal:negative Neurological: negative Behavioral/Psych: negative Endocrine: negative Allergic/Immunologic: negative   PHYSICAL EXAMINATION: General appearance: alert, cooperative and no distress Head: Normocephalic, without obvious abnormality, atraumatic Neck: no adenopathy, no JVD, supple, symmetrical, trachea midline and thyroid not enlarged, symmetric, no tenderness/mass/nodules Lymph nodes: Cervical, supraclavicular, and axillary nodes normal. Resp: clear to auscultation bilaterally Back: symmetric, no curvature. ROM normal. No CVA tenderness. Cardio: regular rate and rhythm, S1, S2 normal, no murmur, click, rub or gallop GI: soft, non-tender; bowel sounds normal; no masses,  no organomegaly Extremities: extremities normal, atraumatic, no cyanosis or edema Neurologic: Alert and oriented X 3, normal strength and tone. Normal symmetric reflexes. Normal coordination and gait  ECOG PERFORMANCE STATUS: 1 - Symptomatic but completely ambulatory  Blood pressure 143/72, pulse 63, temperature 96.9 F (36.1 C), temperature source Oral, resp. rate 18, height 6' 1"  (1.854 m), weight 186 lb 3.2 oz (84.46 kg).  LABORATORY DATA: Lab Results  Component Value Date   WBC 3.5* 07/26/2013   HGB 10.0* 07/26/2013   HCT 31.9* 07/26/2013   MCV 76.6* 07/26/2013   PLT 154 07/26/2013      Chemistry      Component Value Date/Time   NA 139 07/21/2013 0838   NA 139 06/03/2013 1002   K 4.0 07/21/2013 0838   K 3.7 06/03/2013 1002   CL 100 06/03/2013 1002   CL 103 07/12/2012 0907   CO2 25 07/21/2013 0838    CO2 26 06/03/2013 1002   BUN 11.3 07/21/2013 0838   BUN 17 06/03/2013 1002   CREATININE 1.0 07/21/2013 0838   CREATININE 1.28 06/03/2013 1002      Component Value Date/Time   CALCIUM 8.9 07/21/2013 0838   CALCIUM 8.8 06/03/2013 1002   ALKPHOS 37* 07/21/2013 0838   ALKPHOS 35* 03/08/2013 0912   AST 14 07/21/2013 0838   AST 13 03/08/2013 0912   ALT 14 07/21/2013 0838   ALT 14 03/08/2013 0912   BILITOT 1.20 07/21/2013 0838   BILITOT 1.2 03/08/2013 0912     Other lab results: Beta-2 microglobulin 2.89, free kappa light chain 0.03, free lambda light chain 8.10, with a kappa/lambda ratio 0.00. IgG 179, IgA 1130 and IgM less than 5.  RADIOGRAPHIC STUDIES: No results found.  ASSESSMENT AND PLAN: This is a very pleasant 68 years old [place] American male with history of multiple myeloma currently undergoing systemic chemotherapy with Carfilzomib, cyclophosphamide and  Decadron status post 10 cycles.  His recent myeloma panel showed improvement in his disease after resuming the systemic chemotherapy with Carfilzomib, Cytoxan and Decadron. I recommended for him to continue his current treatment. Here to start cycle #11 today. He would come back for followup visit in 2 months for reevaluation. He'll continue on Zometa every 2 months. He was advised to call immediately if he has any concerning symptoms in the interval.  The patient voices understanding of current disease status and treatment options and is in agreement with the current care plan. All questions were answered. The patient knows to call the clinic with any problems, questions or concerns. We can certainly see the patient much sooner if necessary.  Disclaimer: This note was dictated with voice recognition software. Similar sounding words can inadvertently be transcribed and may not be corrected upon review.

## 2013-07-26 NOTE — Patient Instructions (Signed)
[facility] Discharge Instructions for Patients Receiving Chemotherapy  Today you received the following chemotherapy agents :  Cytoxan, Kyprolis.  To help prevent nausea and vomiting after your treatment, we encourage you to take your nausea medication as instructed by your physician.   If you develop nausea and vomiting that is not controlled by your nausea medication, call the clinic.   BELOW ARE SYMPTOMS THAT SHOULD BE REPORTED IMMEDIATELY:  *FEVER GREATER THAN 100.5 F  *CHILLS WITH OR WITHOUT FEVER  NAUSEA AND VOMITING THAT IS NOT CONTROLLED WITH YOUR NAUSEA MEDICATION  *UNUSUAL SHORTNESS OF BREATH  *UNUSUAL BRUISING OR BLEEDING  TENDERNESS IN MOUTH AND THROAT WITH OR WITHOUT PRESENCE OF ULCERS  *URINARY PROBLEMS  *BOWEL PROBLEMS  UNUSUAL RASH Items with * indicate a potential emergency and should be followed up as soon as possible.  Feel free to call the clinic you have any questions or concerns. The clinic phone number is [phone].    

## 2013-07-26 NOTE — Telephone Encounter (Signed)
Pt will come by and get appt calendar with chemo appt later today after chemo

## 2013-07-27 ENCOUNTER — Ambulatory Visit (HOSPITAL_BASED_OUTPATIENT_CLINIC_OR_DEPARTMENT_OTHER): Payer: Medicare Other

## 2013-07-27 VITALS — BP 127/65 | HR 62 | Temp 97.9°F | Resp 19

## 2013-07-27 DIAGNOSIS — C9000 Multiple myeloma not having achieved remission: Secondary | ICD-10-CM

## 2013-07-27 DIAGNOSIS — Z5112 Encounter for antineoplastic immunotherapy: Secondary | ICD-10-CM

## 2013-07-27 MED ORDER — HEPARIN SOD (PORK) LOCK FLUSH 100 UNIT/ML IV SOLN
500.0000 [IU] | Freq: Once | INTRAVENOUS | Status: AC | PRN
  Administered 2013-07-27: 500 [IU]
  Filled 2013-07-27: qty 5

## 2013-07-27 MED ORDER — CARFILZOMIB CHEMO INJECTION 60 MG
27.0000 mg/m2 | Freq: Once | INTRAVENOUS | Status: AC
  Administered 2013-07-27: 54 mg via INTRAVENOUS
  Filled 2013-07-27: qty 27

## 2013-07-27 MED ORDER — DEXAMETHASONE SODIUM PHOSPHATE 10 MG/ML IJ SOLN
10.0000 mg | Freq: Once | INTRAMUSCULAR | Status: AC
  Administered 2013-07-27: 10 mg via INTRAVENOUS

## 2013-07-27 MED ORDER — SODIUM CHLORIDE 0.9 % IJ SOLN
10.0000 mL | INTRAMUSCULAR | Status: DC | PRN
  Administered 2013-07-27: 10 mL
  Filled 2013-07-27: qty 10

## 2013-07-27 MED ORDER — SODIUM CHLORIDE 0.9 % IV SOLN
Freq: Once | INTRAVENOUS | Status: AC
  Administered 2013-07-27: 14:00:00 via INTRAVENOUS

## 2013-07-27 MED ORDER — ONDANSETRON 8 MG/NS 50 ML IVPB
INTRAVENOUS | Status: AC
  Filled 2013-07-27: qty 8

## 2013-07-27 MED ORDER — ONDANSETRON 8 MG/50ML IVPB (CHCC)
8.0000 mg | Freq: Once | INTRAVENOUS | Status: AC
  Administered 2013-07-27: 8 mg via INTRAVENOUS

## 2013-07-27 MED ORDER — DEXAMETHASONE SODIUM PHOSPHATE 10 MG/ML IJ SOLN
INTRAMUSCULAR | Status: AC
  Filled 2013-07-27: qty 1

## 2013-07-27 NOTE — Patient Instructions (Signed)
[facility] Discharge Instructions for Patients Receiving Chemotherapy  Today you received the following chemotherapy agents Kyprolis To help prevent nausea and vomiting after your treatment, we encourage you to take your nausea medication as prescribed.  If you develop nausea and vomiting that is not controlled by your nausea medication, call the clinic.   BELOW ARE SYMPTOMS THAT SHOULD BE REPORTED IMMEDIATELY:  *FEVER GREATER THAN 100.5 F  *CHILLS WITH OR WITHOUT FEVER  NAUSEA AND VOMITING THAT IS NOT CONTROLLED WITH YOUR NAUSEA MEDICATION  *UNUSUAL SHORTNESS OF BREATH  *UNUSUAL BRUISING OR BLEEDING  TENDERNESS IN MOUTH AND THROAT WITH OR WITHOUT PRESENCE OF ULCERS  *URINARY PROBLEMS  *BOWEL PROBLEMS  UNUSUAL RASH Items with * indicate a potential emergency and should be followed up as soon as possible.  Feel free to call the clinic you have any questions or concerns. The clinic phone number is [phone].    

## 2013-07-28 ENCOUNTER — Ambulatory Visit: Payer: Medicare Other | Admitting: Internal Medicine

## 2013-08-02 ENCOUNTER — Other Ambulatory Visit (HOSPITAL_BASED_OUTPATIENT_CLINIC_OR_DEPARTMENT_OTHER): Payer: Medicare Other

## 2013-08-02 ENCOUNTER — Ambulatory Visit (HOSPITAL_BASED_OUTPATIENT_CLINIC_OR_DEPARTMENT_OTHER): Payer: Medicare Other

## 2013-08-02 VITALS — BP 159/81 | HR 61 | Temp 97.0°F | Resp 16

## 2013-08-02 DIAGNOSIS — C9000 Multiple myeloma not having achieved remission: Secondary | ICD-10-CM

## 2013-08-02 DIAGNOSIS — C9002 Multiple myeloma in relapse: Secondary | ICD-10-CM

## 2013-08-02 DIAGNOSIS — Z5112 Encounter for antineoplastic immunotherapy: Secondary | ICD-10-CM

## 2013-08-02 DIAGNOSIS — Z5111 Encounter for antineoplastic chemotherapy: Secondary | ICD-10-CM

## 2013-08-02 LAB — CBC WITH DIFFERENTIAL/PLATELET
BASO%: 0 % (ref 0.0–2.0)
Basophils Absolute: 0 10*3/uL (ref 0.0–0.1)
EOS%: 0.9 % (ref 0.0–7.0)
Eosinophils Absolute: 0 10*3/uL (ref 0.0–0.5)
HCT: 29.5 % — ABNORMAL LOW (ref 38.4–49.9)
HGB: 9.5 g/dL — ABNORMAL LOW (ref 13.0–17.1)
LYMPH%: 19.7 % (ref 14.0–49.0)
MCH: 24 pg — ABNORMAL LOW (ref 27.2–33.4)
MCHC: 32.2 g/dL (ref 32.0–36.0)
MCV: 74.5 fL — ABNORMAL LOW (ref 79.3–98.0)
MONO#: 0.4 10*3/uL (ref 0.1–0.9)
MONO%: 11.6 % (ref 0.0–14.0)
NEUT#: 2.3 10*3/uL (ref 1.5–6.5)
NEUT%: 67.8 % (ref 39.0–75.0)
PLATELETS: 95 10*3/uL — AB (ref 140–400)
RBC: 3.96 10*6/uL — AB (ref 4.20–5.82)
RDW: 15.9 % — AB (ref 11.0–14.6)
WBC: 3.5 10*3/uL — ABNORMAL LOW (ref 4.0–10.3)
lymph#: 0.7 10*3/uL — ABNORMAL LOW (ref 0.9–3.3)
nRBC: 0 % (ref 0–0)

## 2013-08-02 LAB — COMPREHENSIVE METABOLIC PANEL (CC13)
ALT: 10 U/L (ref 0–55)
AST: 14 U/L (ref 5–34)
Albumin: 3.3 g/dL — ABNORMAL LOW (ref 3.5–5.0)
Alkaline Phosphatase: 37 U/L — ABNORMAL LOW (ref 40–150)
Anion Gap: 7 mEq/L (ref 3–11)
BILIRUBIN TOTAL: 1.6 mg/dL — AB (ref 0.20–1.20)
BUN: 11.3 mg/dL (ref 7.0–26.0)
CHLORIDE: 106 meq/L (ref 98–109)
CO2: 25 meq/L (ref 22–29)
Calcium: 8.8 mg/dL (ref 8.4–10.4)
Creatinine: 1 mg/dL (ref 0.7–1.3)
Glucose: 159 mg/dl — ABNORMAL HIGH (ref 70–140)
Potassium: 4.1 mEq/L (ref 3.5–5.1)
SODIUM: 138 meq/L (ref 136–145)
TOTAL PROTEIN: 6.3 g/dL — AB (ref 6.4–8.3)

## 2013-08-02 MED ORDER — SODIUM CHLORIDE 0.9 % IV SOLN
300.0000 mg/m2 | Freq: Once | INTRAVENOUS | Status: AC
  Administered 2013-08-02: 600 mg via INTRAVENOUS
  Filled 2013-08-02: qty 30

## 2013-08-02 MED ORDER — DEXAMETHASONE SODIUM PHOSPHATE 10 MG/ML IJ SOLN
10.0000 mg | Freq: Once | INTRAMUSCULAR | Status: AC
  Administered 2013-08-02: 10 mg via INTRAVENOUS

## 2013-08-02 MED ORDER — SODIUM CHLORIDE 0.9 % IV SOLN
Freq: Once | INTRAVENOUS | Status: AC
  Administered 2013-08-02: 10:00:00 via INTRAVENOUS

## 2013-08-02 MED ORDER — HEPARIN SOD (PORK) LOCK FLUSH 100 UNIT/ML IV SOLN
500.0000 [IU] | Freq: Once | INTRAVENOUS | Status: AC | PRN
  Administered 2013-08-02: 500 [IU]
  Filled 2013-08-02: qty 5

## 2013-08-02 MED ORDER — DEXTROSE 5 % IV SOLN
27.0000 mg/m2 | Freq: Once | INTRAVENOUS | Status: AC
  Administered 2013-08-02: 54 mg via INTRAVENOUS
  Filled 2013-08-02: qty 27

## 2013-08-02 MED ORDER — ONDANSETRON 8 MG/50ML IVPB (CHCC)
8.0000 mg | Freq: Once | INTRAVENOUS | Status: AC
Start: 2013-08-02 — End: 2013-08-02
  Administered 2013-08-02: 8 mg via INTRAVENOUS

## 2013-08-02 MED ORDER — DEXAMETHASONE SODIUM PHOSPHATE 10 MG/ML IJ SOLN
INTRAMUSCULAR | Status: AC
  Filled 2013-08-02: qty 1

## 2013-08-02 MED ORDER — ONDANSETRON 8 MG/NS 50 ML IVPB
INTRAVENOUS | Status: AC
  Filled 2013-08-02: qty 8

## 2013-08-02 MED ORDER — SODIUM CHLORIDE 0.9 % IJ SOLN
10.0000 mL | INTRAMUSCULAR | Status: DC | PRN
  Administered 2013-08-02: 10 mL
  Filled 2013-08-02: qty 10

## 2013-08-02 MED ORDER — DEXAMETHASONE 4 MG PO TABS
ORAL_TABLET | ORAL | Status: AC
  Filled 2013-08-02: qty 10

## 2013-08-02 MED ORDER — DEXAMETHASONE 4 MG PO TABS
40.0000 mg | ORAL_TABLET | Freq: Once | ORAL | Status: AC
  Administered 2013-08-02: 40 mg via ORAL

## 2013-08-02 MED ORDER — SODIUM CHLORIDE 0.9 % IV SOLN
Freq: Once | INTRAVENOUS | Status: AC
  Administered 2013-08-02: 11:00:00 via INTRAVENOUS

## 2013-08-02 NOTE — Progress Notes (Signed)
Per Dr Julien Nordmann it is okay to treat pt today with chemotherapy and today's CBC/diff results.

## 2013-08-02 NOTE — Progress Notes (Signed)
OK to treat with platelet count of 95 per Dr Julien Nordmann

## 2013-08-02 NOTE — Patient Instructions (Signed)
[facility] Discharge Instructions for Patients Receiving Chemotherapy  Today you received the following chemotherapy agents kyprolis, cytoxan  To help prevent nausea and vomiting after your treatment, we encourage you to take your nausea medication as needed   If you develop nausea and vomiting that is not controlled by your nausea medication, call the clinic.   BELOW ARE SYMPTOMS THAT SHOULD BE REPORTED IMMEDIATELY:  *FEVER GREATER THAN 100.5 F  *CHILLS WITH OR WITHOUT FEVER  NAUSEA AND VOMITING THAT IS NOT CONTROLLED WITH YOUR NAUSEA MEDICATION  *UNUSUAL SHORTNESS OF BREATH  *UNUSUAL BRUISING OR BLEEDING  TENDERNESS IN MOUTH AND THROAT WITH OR WITHOUT PRESENCE OF ULCERS  *URINARY PROBLEMS  *BOWEL PROBLEMS  UNUSUAL RASH Items with * indicate a potential emergency and should be followed up as soon as possible.  Feel free to call the clinic you have any questions or concerns. The clinic phone number is [phone].    

## 2013-08-03 ENCOUNTER — Ambulatory Visit (HOSPITAL_BASED_OUTPATIENT_CLINIC_OR_DEPARTMENT_OTHER): Payer: Medicare Other

## 2013-08-03 DIAGNOSIS — Z5112 Encounter for antineoplastic immunotherapy: Secondary | ICD-10-CM

## 2013-08-03 DIAGNOSIS — C9002 Multiple myeloma in relapse: Secondary | ICD-10-CM

## 2013-08-03 DIAGNOSIS — C9000 Multiple myeloma not having achieved remission: Secondary | ICD-10-CM

## 2013-08-03 MED ORDER — ONDANSETRON 8 MG/NS 50 ML IVPB
INTRAVENOUS | Status: AC
  Filled 2013-08-03: qty 8

## 2013-08-03 MED ORDER — ONDANSETRON 8 MG/50ML IVPB (CHCC)
8.0000 mg | Freq: Once | INTRAVENOUS | Status: AC
Start: 2013-08-03 — End: 2013-08-03
  Administered 2013-08-03: 8 mg via INTRAVENOUS

## 2013-08-03 MED ORDER — DEXTROSE 5 % IV SOLN
27.0000 mg/m2 | Freq: Once | INTRAVENOUS | Status: AC
  Administered 2013-08-03: 54 mg via INTRAVENOUS
  Filled 2013-08-03: qty 27

## 2013-08-03 MED ORDER — SODIUM CHLORIDE 0.9 % IJ SOLN
10.0000 mL | INTRAMUSCULAR | Status: DC | PRN
  Administered 2013-08-03: 10 mL
  Filled 2013-08-03: qty 10

## 2013-08-03 MED ORDER — SODIUM CHLORIDE 0.9 % IV SOLN
Freq: Once | INTRAVENOUS | Status: AC
  Administered 2013-08-03: 15:00:00 via INTRAVENOUS

## 2013-08-03 MED ORDER — DEXAMETHASONE SODIUM PHOSPHATE 10 MG/ML IJ SOLN
INTRAMUSCULAR | Status: AC
  Filled 2013-08-03: qty 1

## 2013-08-03 MED ORDER — HEPARIN SOD (PORK) LOCK FLUSH 100 UNIT/ML IV SOLN
500.0000 [IU] | Freq: Once | INTRAVENOUS | Status: AC | PRN
  Administered 2013-08-03: 500 [IU]
  Filled 2013-08-03: qty 5

## 2013-08-03 MED ORDER — DEXAMETHASONE SODIUM PHOSPHATE 10 MG/ML IJ SOLN
10.0000 mg | Freq: Once | INTRAMUSCULAR | Status: AC
  Administered 2013-08-03: 10 mg via INTRAVENOUS

## 2013-08-09 ENCOUNTER — Ambulatory Visit (HOSPITAL_BASED_OUTPATIENT_CLINIC_OR_DEPARTMENT_OTHER): Payer: Medicare Other

## 2013-08-09 ENCOUNTER — Other Ambulatory Visit (HOSPITAL_BASED_OUTPATIENT_CLINIC_OR_DEPARTMENT_OTHER): Payer: Medicare Other

## 2013-08-09 VITALS — BP 147/71 | HR 70 | Temp 98.4°F | Resp 18

## 2013-08-09 DIAGNOSIS — Z5111 Encounter for antineoplastic chemotherapy: Secondary | ICD-10-CM

## 2013-08-09 DIAGNOSIS — Z5112 Encounter for antineoplastic immunotherapy: Secondary | ICD-10-CM

## 2013-08-09 DIAGNOSIS — C9002 Multiple myeloma in relapse: Secondary | ICD-10-CM

## 2013-08-09 DIAGNOSIS — C9000 Multiple myeloma not having achieved remission: Secondary | ICD-10-CM

## 2013-08-09 LAB — COMPREHENSIVE METABOLIC PANEL (CC13)
ALT: 11 U/L (ref 0–55)
AST: 13 U/L (ref 5–34)
Albumin: 3.3 g/dL — ABNORMAL LOW (ref 3.5–5.0)
Alkaline Phosphatase: 37 U/L — ABNORMAL LOW (ref 40–150)
Anion Gap: 9 mEq/L (ref 3–11)
BUN: 11.2 mg/dL (ref 7.0–26.0)
CO2: 24 meq/L (ref 22–29)
CREATININE: 1 mg/dL (ref 0.7–1.3)
Calcium: 8.7 mg/dL (ref 8.4–10.4)
Chloride: 107 mEq/L (ref 98–109)
GLUCOSE: 159 mg/dL — AB (ref 70–140)
Potassium: 3.8 mEq/L (ref 3.5–5.1)
Sodium: 139 mEq/L (ref 136–145)
TOTAL PROTEIN: 6.1 g/dL — AB (ref 6.4–8.3)
Total Bilirubin: 1.36 mg/dL — ABNORMAL HIGH (ref 0.20–1.20)

## 2013-08-09 LAB — CBC WITH DIFFERENTIAL/PLATELET
BASO%: 0.5 % (ref 0.0–2.0)
Basophils Absolute: 0 10*3/uL (ref 0.0–0.1)
EOS ABS: 0 10*3/uL (ref 0.0–0.5)
EOS%: 0.5 % (ref 0.0–7.0)
HEMATOCRIT: 29.2 % — AB (ref 38.4–49.9)
HGB: 9.2 g/dL — ABNORMAL LOW (ref 13.0–17.1)
LYMPH%: 11.6 % — ABNORMAL LOW (ref 14.0–49.0)
MCH: 24.1 pg — ABNORMAL LOW (ref 27.2–33.4)
MCHC: 31.6 g/dL — ABNORMAL LOW (ref 32.0–36.0)
MCV: 76 fL — ABNORMAL LOW (ref 79.3–98.0)
MONO#: 0.3 10*3/uL (ref 0.1–0.9)
MONO%: 6.4 % (ref 0.0–14.0)
NEUT%: 81 % — AB (ref 39.0–75.0)
NEUTROS ABS: 3.6 10*3/uL (ref 1.5–6.5)
PLATELETS: 128 10*3/uL — AB (ref 140–400)
RBC: 3.84 10*6/uL — ABNORMAL LOW (ref 4.20–5.82)
RDW: 15.8 % — ABNORMAL HIGH (ref 11.0–14.6)
WBC: 4.4 10*3/uL (ref 4.0–10.3)
lymph#: 0.5 10*3/uL — ABNORMAL LOW (ref 0.9–3.3)

## 2013-08-09 MED ORDER — SODIUM CHLORIDE 0.9 % IV SOLN
Freq: Once | INTRAVENOUS | Status: AC
  Administered 2013-08-09: 10:00:00 via INTRAVENOUS

## 2013-08-09 MED ORDER — DEXAMETHASONE SODIUM PHOSPHATE 10 MG/ML IJ SOLN
10.0000 mg | Freq: Once | INTRAMUSCULAR | Status: AC
  Administered 2013-08-09: 10 mg via INTRAVENOUS

## 2013-08-09 MED ORDER — HEPARIN SOD (PORK) LOCK FLUSH 100 UNIT/ML IV SOLN
500.0000 [IU] | Freq: Once | INTRAVENOUS | Status: DC | PRN
  Filled 2013-08-09: qty 5

## 2013-08-09 MED ORDER — CYCLOPHOSPHAMIDE CHEMO INJECTION 1 GM
300.0000 mg/m2 | Freq: Once | INTRAMUSCULAR | Status: AC
  Administered 2013-08-09: 600 mg via INTRAVENOUS
  Filled 2013-08-09: qty 30

## 2013-08-09 MED ORDER — ONDANSETRON 8 MG/NS 50 ML IVPB
INTRAVENOUS | Status: AC
  Filled 2013-08-09: qty 8

## 2013-08-09 MED ORDER — DEXAMETHASONE SODIUM PHOSPHATE 10 MG/ML IJ SOLN
INTRAMUSCULAR | Status: AC
  Filled 2013-08-09: qty 1

## 2013-08-09 MED ORDER — SODIUM CHLORIDE 0.9 % IJ SOLN
10.0000 mL | INTRAMUSCULAR | Status: DC | PRN
  Filled 2013-08-09: qty 10

## 2013-08-09 MED ORDER — DEXTROSE 5 % IV SOLN
27.0000 mg/m2 | Freq: Once | INTRAVENOUS | Status: AC
  Administered 2013-08-09: 54 mg via INTRAVENOUS
  Filled 2013-08-09: qty 27

## 2013-08-09 MED ORDER — ONDANSETRON 8 MG/50ML IVPB (CHCC)
8.0000 mg | Freq: Once | INTRAVENOUS | Status: AC
  Administered 2013-08-09: 8 mg via INTRAVENOUS

## 2013-08-09 NOTE — Patient Instructions (Signed)
[facility] Discharge Instructions for Patients Receiving Chemotherapy  Today you received the following chemotherapy agents Cytoxan and Kyprolis.  To help prevent nausea and vomiting after your treatment, we encourage you to take your nausea medication as prescribed.   If you develop nausea and vomiting that is not controlled by your nausea medication, call the clinic.   BELOW ARE SYMPTOMS THAT SHOULD BE REPORTED IMMEDIATELY:  *FEVER GREATER THAN 100.5 F  *CHILLS WITH OR WITHOUT FEVER  NAUSEA AND VOMITING THAT IS NOT CONTROLLED WITH YOUR NAUSEA MEDICATION  *UNUSUAL SHORTNESS OF BREATH  *UNUSUAL BRUISING OR BLEEDING  TENDERNESS IN MOUTH AND THROAT WITH OR WITHOUT PRESENCE OF ULCERS  *URINARY PROBLEMS  *BOWEL PROBLEMS  UNUSUAL RASH Items with * indicate a potential emergency and should be followed up as soon as possible.  Feel free to call the clinic you have any questions or concerns. The clinic phone number is [phone].    

## 2013-08-09 NOTE — Progress Notes (Signed)
Pt requested portacath be left accessed. Pt left accessed for consecutive days treatment per charge RN Tanya.

## 2013-08-10 ENCOUNTER — Ambulatory Visit (HOSPITAL_BASED_OUTPATIENT_CLINIC_OR_DEPARTMENT_OTHER): Payer: Medicare Other

## 2013-08-10 VITALS — BP 147/71 | HR 68 | Temp 97.6°F | Resp 17

## 2013-08-10 DIAGNOSIS — C9002 Multiple myeloma in relapse: Secondary | ICD-10-CM

## 2013-08-10 DIAGNOSIS — C9000 Multiple myeloma not having achieved remission: Secondary | ICD-10-CM

## 2013-08-10 DIAGNOSIS — Z5112 Encounter for antineoplastic immunotherapy: Secondary | ICD-10-CM

## 2013-08-10 MED ORDER — ONDANSETRON 8 MG/50ML IVPB (CHCC)
8.0000 mg | Freq: Once | INTRAVENOUS | Status: AC
  Administered 2013-08-10: 8 mg via INTRAVENOUS

## 2013-08-10 MED ORDER — DEXAMETHASONE SODIUM PHOSPHATE 10 MG/ML IJ SOLN
10.0000 mg | Freq: Once | INTRAMUSCULAR | Status: AC
  Administered 2013-08-10: 10 mg via INTRAVENOUS

## 2013-08-10 MED ORDER — SODIUM CHLORIDE 0.9 % IV SOLN
Freq: Once | INTRAVENOUS | Status: AC
  Administered 2013-08-10: 15:00:00 via INTRAVENOUS

## 2013-08-10 MED ORDER — CARFILZOMIB CHEMO INJECTION 60 MG
27.0000 mg/m2 | Freq: Once | INTRAVENOUS | Status: AC
  Administered 2013-08-10: 54 mg via INTRAVENOUS
  Filled 2013-08-10: qty 27

## 2013-08-10 MED ORDER — DEXAMETHASONE SODIUM PHOSPHATE 10 MG/ML IJ SOLN
INTRAMUSCULAR | Status: AC
  Filled 2013-08-10: qty 1

## 2013-08-10 MED ORDER — HEPARIN SOD (PORK) LOCK FLUSH 100 UNIT/ML IV SOLN
500.0000 [IU] | Freq: Once | INTRAVENOUS | Status: AC | PRN
Start: 2013-08-10 — End: 2013-08-10
  Administered 2013-08-10: 500 [IU]
  Filled 2013-08-10: qty 5

## 2013-08-10 MED ORDER — SODIUM CHLORIDE 0.9 % IJ SOLN
10.0000 mL | INTRAMUSCULAR | Status: DC | PRN
  Administered 2013-08-10: 10 mL
  Filled 2013-08-10: qty 10

## 2013-08-10 MED ORDER — ONDANSETRON 8 MG/NS 50 ML IVPB
INTRAVENOUS | Status: AC
  Filled 2013-08-10: qty 8

## 2013-08-10 NOTE — Patient Instructions (Signed)
[facility] Discharge Instructions for Patients Receiving Chemotherapy  Today you received the following chemotherapy agents: Kyprolis  To help prevent nausea and vomiting after your treatment, we encourage you to take your nausea medication as prescribed by your physician.   If you develop nausea and vomiting that is not controlled by your nausea medication, call the clinic.   BELOW ARE SYMPTOMS THAT SHOULD BE REPORTED IMMEDIATELY:  *FEVER GREATER THAN 100.5 F  *CHILLS WITH OR WITHOUT FEVER  NAUSEA AND VOMITING THAT IS NOT CONTROLLED WITH YOUR NAUSEA MEDICATION  *UNUSUAL SHORTNESS OF BREATH  *UNUSUAL BRUISING OR BLEEDING  TENDERNESS IN MOUTH AND THROAT WITH OR WITHOUT PRESENCE OF ULCERS  *URINARY PROBLEMS  *BOWEL PROBLEMS  UNUSUAL RASH Items with * indicate a potential emergency and should be followed up as soon as possible.  Feel free to call the clinic you have any questions or concerns. The clinic phone number is [phone].    

## 2013-08-16 ENCOUNTER — Other Ambulatory Visit (HOSPITAL_BASED_OUTPATIENT_CLINIC_OR_DEPARTMENT_OTHER): Payer: Medicare Other

## 2013-08-16 DIAGNOSIS — C9002 Multiple myeloma in relapse: Secondary | ICD-10-CM

## 2013-08-16 LAB — CBC WITH DIFFERENTIAL/PLATELET
BASO%: 0.1 % (ref 0.0–2.0)
BASOS ABS: 0 10*3/uL (ref 0.0–0.1)
EOS%: 0.7 % (ref 0.0–7.0)
Eosinophils Absolute: 0 10*3/uL (ref 0.0–0.5)
HEMATOCRIT: 27.7 % — AB (ref 38.4–49.9)
HEMOGLOBIN: 8.7 g/dL — AB (ref 13.0–17.1)
LYMPH%: 9.3 % — ABNORMAL LOW (ref 14.0–49.0)
MCH: 24.2 pg — ABNORMAL LOW (ref 27.2–33.4)
MCHC: 31.4 g/dL — ABNORMAL LOW (ref 32.0–36.0)
MCV: 76.9 fL — ABNORMAL LOW (ref 79.3–98.0)
MONO#: 0.5 10*3/uL (ref 0.1–0.9)
MONO%: 8.4 % (ref 0.0–14.0)
NEUT#: 4.5 10*3/uL (ref 1.5–6.5)
NEUT%: 81.5 % — AB (ref 39.0–75.0)
Platelets: 123 10*3/uL — ABNORMAL LOW (ref 140–400)
RBC: 3.6 10*6/uL — ABNORMAL LOW (ref 4.20–5.82)
RDW: 15.8 % — ABNORMAL HIGH (ref 11.0–14.6)
WBC: 5.5 10*3/uL (ref 4.0–10.3)
lymph#: 0.5 10*3/uL — ABNORMAL LOW (ref 0.9–3.3)

## 2013-08-16 LAB — COMPREHENSIVE METABOLIC PANEL (CC13)
ALBUMIN: 3.1 g/dL — AB (ref 3.5–5.0)
ALT: 12 U/L (ref 0–55)
AST: 14 U/L (ref 5–34)
Alkaline Phosphatase: 40 U/L (ref 40–150)
Anion Gap: 6 mEq/L (ref 3–11)
BUN: 6.4 mg/dL — ABNORMAL LOW (ref 7.0–26.0)
CO2: 26 mEq/L (ref 22–29)
Calcium: 8.8 mg/dL (ref 8.4–10.4)
Chloride: 105 mEq/L (ref 98–109)
Creatinine: 0.9 mg/dL (ref 0.7–1.3)
GLUCOSE: 137 mg/dL (ref 70–140)
POTASSIUM: 4.2 meq/L (ref 3.5–5.1)
Sodium: 137 mEq/L (ref 136–145)
Total Bilirubin: 1.66 mg/dL — ABNORMAL HIGH (ref 0.20–1.20)
Total Protein: 6 g/dL — ABNORMAL LOW (ref 6.4–8.3)

## 2013-08-23 ENCOUNTER — Other Ambulatory Visit (HOSPITAL_BASED_OUTPATIENT_CLINIC_OR_DEPARTMENT_OTHER): Payer: Medicare Other

## 2013-08-23 ENCOUNTER — Ambulatory Visit (HOSPITAL_BASED_OUTPATIENT_CLINIC_OR_DEPARTMENT_OTHER): Payer: Medicare Other | Admitting: Internal Medicine

## 2013-08-23 ENCOUNTER — Encounter: Payer: Self-pay | Admitting: Internal Medicine

## 2013-08-23 ENCOUNTER — Ambulatory Visit (HOSPITAL_BASED_OUTPATIENT_CLINIC_OR_DEPARTMENT_OTHER): Payer: Medicare Other

## 2013-08-23 ENCOUNTER — Telehealth: Payer: Self-pay | Admitting: Internal Medicine

## 2013-08-23 VITALS — BP 128/59 | HR 68 | Temp 98.2°F | Resp 18 | Ht 73.0 in | Wt 187.8 lb

## 2013-08-23 DIAGNOSIS — C9000 Multiple myeloma not having achieved remission: Secondary | ICD-10-CM

## 2013-08-23 DIAGNOSIS — Z5112 Encounter for antineoplastic immunotherapy: Secondary | ICD-10-CM

## 2013-08-23 DIAGNOSIS — D696 Thrombocytopenia, unspecified: Secondary | ICD-10-CM

## 2013-08-23 DIAGNOSIS — C9002 Multiple myeloma in relapse: Secondary | ICD-10-CM

## 2013-08-23 LAB — CBC WITH DIFFERENTIAL/PLATELET
BASO%: 0.6 % (ref 0.0–2.0)
BASOS ABS: 0 10*3/uL (ref 0.0–0.1)
EOS ABS: 0 10*3/uL (ref 0.0–0.5)
EOS%: 1 % (ref 0.0–7.0)
HCT: 28.6 % — ABNORMAL LOW (ref 38.4–49.9)
HEMOGLOBIN: 8.9 g/dL — AB (ref 13.0–17.1)
LYMPH%: 22.2 % (ref 14.0–49.0)
MCH: 24.1 pg — ABNORMAL LOW (ref 27.2–33.4)
MCHC: 31.3 g/dL — ABNORMAL LOW (ref 32.0–36.0)
MCV: 77.2 fL — ABNORMAL LOW (ref 79.3–98.0)
MONO#: 0.4 10*3/uL (ref 0.1–0.9)
MONO%: 12.2 % (ref 0.0–14.0)
NEUT%: 64 % (ref 39.0–75.0)
NEUTROS ABS: 1.9 10*3/uL (ref 1.5–6.5)
PLATELETS: 170 10*3/uL (ref 140–400)
RBC: 3.7 10*6/uL — AB (ref 4.20–5.82)
RDW: 16.1 % — AB (ref 11.0–14.6)
WBC: 2.9 10*3/uL — AB (ref 4.0–10.3)
lymph#: 0.7 10*3/uL — ABNORMAL LOW (ref 0.9–3.3)

## 2013-08-23 LAB — COMPREHENSIVE METABOLIC PANEL (CC13)
ALBUMIN: 3.2 g/dL — AB (ref 3.5–5.0)
ALT: 8 U/L (ref 0–55)
ANION GAP: 10 meq/L (ref 3–11)
AST: 14 U/L (ref 5–34)
Alkaline Phosphatase: 37 U/L — ABNORMAL LOW (ref 40–150)
BUN: 10.6 mg/dL (ref 7.0–26.0)
CO2: 23 meq/L (ref 22–29)
Calcium: 9 mg/dL (ref 8.4–10.4)
Chloride: 107 mEq/L (ref 98–109)
Creatinine: 1 mg/dL (ref 0.7–1.3)
GLUCOSE: 115 mg/dL (ref 70–140)
POTASSIUM: 3.8 meq/L (ref 3.5–5.1)
Sodium: 140 mEq/L (ref 136–145)
Total Bilirubin: 1.07 mg/dL (ref 0.20–1.20)
Total Protein: 6.4 g/dL (ref 6.4–8.3)

## 2013-08-23 MED ORDER — ONDANSETRON 8 MG/NS 50 ML IVPB
INTRAVENOUS | Status: AC
  Filled 2013-08-23: qty 8

## 2013-08-23 MED ORDER — SODIUM CHLORIDE 0.9 % IV SOLN
Freq: Once | INTRAVENOUS | Status: AC
  Administered 2013-08-23: 10:00:00 via INTRAVENOUS

## 2013-08-23 MED ORDER — HEPARIN SOD (PORK) LOCK FLUSH 100 UNIT/ML IV SOLN
500.0000 [IU] | Freq: Once | INTRAVENOUS | Status: AC | PRN
  Administered 2013-08-23: 500 [IU]
  Filled 2013-08-23: qty 5

## 2013-08-23 MED ORDER — DEXAMETHASONE SODIUM PHOSPHATE 10 MG/ML IJ SOLN
10.0000 mg | Freq: Once | INTRAMUSCULAR | Status: AC
  Administered 2013-08-23: 10 mg via INTRAVENOUS

## 2013-08-23 MED ORDER — ONDANSETRON 8 MG/50ML IVPB (CHCC)
8.0000 mg | Freq: Once | INTRAVENOUS | Status: AC
  Administered 2013-08-23: 8 mg via INTRAVENOUS

## 2013-08-23 MED ORDER — SODIUM CHLORIDE 0.9 % IJ SOLN
10.0000 mL | INTRAMUSCULAR | Status: DC | PRN
  Administered 2013-08-23: 10 mL
  Filled 2013-08-23: qty 10

## 2013-08-23 MED ORDER — DEXAMETHASONE SODIUM PHOSPHATE 10 MG/ML IJ SOLN
INTRAMUSCULAR | Status: AC
  Filled 2013-08-23: qty 1

## 2013-08-23 MED ORDER — DEXTROSE 5 % IV SOLN
27.0000 mg/m2 | Freq: Once | INTRAVENOUS | Status: AC
  Administered 2013-08-23: 54 mg via INTRAVENOUS
  Filled 2013-08-23: qty 27

## 2013-08-23 MED ORDER — SODIUM CHLORIDE 0.9 % IV SOLN
300.0000 mg/m2 | Freq: Once | INTRAVENOUS | Status: AC
  Administered 2013-08-23: 600 mg via INTRAVENOUS
  Filled 2013-08-23: qty 30

## 2013-08-23 NOTE — Progress Notes (Signed)
[place] Telephone:([phone]) [phone]   [phone]  OFFICE PROGRESS NOTE  Eddie Thomas [address] 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   PRIOR THERAPY: :  1. Status post 6 cycles of systemic chemotherapy with Revlimid and Decadron, last dose was given 07/21/2010 with very good response. 2. 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 Ok Thomas.  3. maintenance Revlimid at 10 mg by mouth daily status post 2 months. Therapy began 01/18/2011. 4. maintenance Revlimid at 15 mg by mouth daily with prophylactic dose Coumadin at 2 mg by mouth daily. 5. Systemic chemotherapy with Velcade at 1.3 mg per meter squared given on days 1, 4, 8 and 11 and Doxil at 30 mg per meter square given on day 4 and Decadron 40 mg by mouth on weekly basis given every 3 weeks. Status post 4 cycles. 6. Zometa 4 mg IV every 4 weeks.  7. Velcade 1.3 mg/M2 subcutaneous daily on a weekly basis with Decadron 20 mg by mouth on a weekly basis. First cycle expected on 06/21/2012. S/P 4 cycles.  CURRENT THERAPY:  1) Systemic chemotherapy with Carfilzomib, cyclophosphamide and Decadron. First cycle started on 08/09/2012. He is status post 11 cycles. 2) Zometa every 8 weeks. Next dose 05/27/2013.   INTERVAL HISTORY: Eddie Thomas 68 y.o. male returns to the clinic today for followup visit. The patient is feeling fine with no specific complaints except for occasional low back pain with radiation to the right leg. He also has few episodes of nosebleed especially in the morning that resolve spontaneously. He resumed his systemic chemotherapy again with Carfilzomib, cyclophosphamide and Decadron. He is tolerating his treatment fairly well with no significant adverse effects. He denied having any significant weight loss or night sweats. He has no fever or chills. He  has no nausea or vomiting. The patient denied having any significant chest pain, shortness of breath, cough or hemoptysis.   MEDICAL HISTORY: Past Medical History  Diagnosis Date  . Multiple myeloma   . Diabetes mellitus 07/03/2011  . Hypertension 07/03/2011  . Hyperlipidemia 07/03/2011  . Colon polyps 2012  . Gastric ulcer     ALLERGIES:  has No Known Allergies.  MEDICATIONS:  Current Outpatient Prescriptions  Medication Sig Dispense Refill  . lisinopril (PRINIVIL,ZESTRIL) 10 MG tablet Take 5 mg by mouth every evening.       [name] oxyCODONE-acetaminophen (PERCOCET/ROXICET) 5-325 MG per tablet Take 1 tablet by mouth every 6 (six) hours as needed for severe pain.  30 tablet  0  . pantoprazole (PROTONIX) 40 MG tablet Take 40 mg by mouth 2 (two) times daily.      . pantoprazole (PROTONIX) 40 MG tablet TAKE ONE TABLET BY MOUTH TWICE DAILY  60 tablet  0  . pioglitazone (ACTOS) 15 MG tablet Take 15 mg by mouth daily.      . prochlorperazine (COMPAZINE) 10 MG tablet Take 10 mg by mouth every 6 (six) hours as needed for nausea or vomiting.      . simvastatin (ZOCOR) 10 MG tablet Take 10 mg by mouth at bedtime.        . temazepam (RESTORIL) 15 MG capsule Take 15 mg by mouth at bedtime as needed for sleep.      . valACYclovir (VALTREX) 500 MG tablet Take 500 mg by mouth daily.      [name] VIAGRA  50 MG tablet Take 50 mg by mouth daily as needed for erectile dysfunction.        No current facility-administered medications for this visit.    SURGICAL HISTORY:  Past Surgical History  Procedure Laterality Date  . Limbal stem cell transplant    . Humerus fracture surgery      right  . 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    REVIEW OF SYSTEMS:  Constitutional: negative Eyes: negative Ears, nose, mouth, throat, and face: negative Respiratory: negative Cardiovascular: negative Gastrointestinal: negative Genitourinary:negative Integument/breast: negative Hematologic/lymphatic:  negative Musculoskeletal:negative Neurological: negative Behavioral/Psych: negative Endocrine: negative Allergic/Immunologic: negative   PHYSICAL EXAMINATION: General appearance: alert, cooperative and no distress Head: Normocephalic, without obvious abnormality, atraumatic Neck: no adenopathy, no JVD, supple, symmetrical, trachea midline and thyroid not enlarged, symmetric, no tenderness/mass/nodules Lymph nodes: Cervical, supraclavicular, and axillary nodes normal. Resp: clear to auscultation bilaterally Back: symmetric, no curvature. ROM normal. No CVA tenderness. Cardio: regular rate and rhythm, S1, S2 normal, no murmur, click, rub or gallop GI: soft, non-tender; bowel sounds normal; no masses,  no organomegaly Extremities: extremities normal, atraumatic, no cyanosis or edema Neurologic: Alert and oriented X 3, normal strength and tone. Normal symmetric reflexes. Normal coordination and gait  ECOG PERFORMANCE STATUS: 1 - Symptomatic but completely ambulatory  Blood pressure 128/59, pulse 68, temperature 98.2 F (36.8 C), temperature source Oral, resp. rate 18, height $RemoveBe'6\' 1"'LAknVLKUl$  (1.854 m), weight 187 lb 12.8 oz (85.186 kg), SpO2 100.00%.  LABORATORY DATA: Lab Results  Component Value Date   WBC 2.9* 08/23/2013   HGB 8.9* 08/23/2013   HCT 28.6* 08/23/2013   MCV 77.2* 08/23/2013   PLT 170 08/23/2013      Chemistry      Component Value Date/Time   NA 140 08/23/2013 0820   NA 139 06/03/2013 1002   K 3.8 08/23/2013 0820   K 3.7 06/03/2013 1002   CL 100 06/03/2013 1002   CL 103 07/12/2012 0907   CO2 23 08/23/2013 0820   CO2 26 06/03/2013 1002   BUN 10.6 08/23/2013 0820   BUN 17 06/03/2013 1002   CREATININE 1.0 08/23/2013 0820   CREATININE 1.28 06/03/2013 1002      Component Value Date/Time   CALCIUM 9.0 08/23/2013 0820   CALCIUM 8.8 06/03/2013 1002   ALKPHOS 37* 08/23/2013 0820   ALKPHOS 35* 03/08/2013 0912   AST 14 08/23/2013 0820   AST 13 03/08/2013 0912   ALT 8 08/23/2013 0820   ALT 14 03/08/2013 0912     BILITOT 1.07 08/23/2013 0820   BILITOT 1.2 03/08/2013 0912     Other lab results:   RADIOGRAPHIC STUDIES: No results found.  ASSESSMENT AND PLAN: This is a very pleasant 68 years old [place] American male with history of multiple myeloma currently undergoing systemic chemotherapy with Carfilzomib, cyclophosphamide and Decadron status post 11 cycles.  I recommended for him to continue his current treatment. Here to start cycle #12 today. He would come back for followup visit in 1 month for reevaluation. He'll continue on Zometa every 2 months. He was advised to call immediately if he has any concerning symptoms in the interval.  The patient voices understanding of current disease status and treatment options and is in agreement with the current care plan. All questions were answered. The patient knows to call the clinic with any problems, questions or concerns. We can certainly see the patient much sooner if necessary.  Disclaimer: This note was dictated  with voice recognition software. Similar sounding words can inadvertently be transcribed and may not be corrected upon review.

## 2013-08-23 NOTE — Patient Instructions (Signed)
[facility] Discharge Instructions for Patients Receiving Chemotherapy  Today you received the following chemotherapy agents: Cytoxan, Kyprolis  To help prevent nausea and vomiting after your treatment, we encourage you to take your nausea medication as prescribed by your physician.    If you develop nausea and vomiting that is not controlled by your nausea medication, call the clinic.   BELOW ARE SYMPTOMS THAT SHOULD BE REPORTED IMMEDIATELY:  *FEVER GREATER THAN 100.5 F  *CHILLS WITH OR WITHOUT FEVER  NAUSEA AND VOMITING THAT IS NOT CONTROLLED WITH YOUR NAUSEA MEDICATION  *UNUSUAL SHORTNESS OF BREATH  *UNUSUAL BRUISING OR BLEEDING  TENDERNESS IN MOUTH AND THROAT WITH OR WITHOUT PRESENCE OF ULCERS  *URINARY PROBLEMS  *BOWEL PROBLEMS  UNUSUAL RASH Items with * indicate a potential emergency and should be followed up as soon as possible.  Feel free to call the clinic you have any questions or concerns. The clinic phone number is [phone].    

## 2013-08-23 NOTE — Telephone Encounter (Signed)
gv and printed appt sched and avs for pt for Aug and SEpt.....sed added tx. °

## 2013-08-24 ENCOUNTER — Ambulatory Visit (HOSPITAL_BASED_OUTPATIENT_CLINIC_OR_DEPARTMENT_OTHER): Payer: Medicare Other

## 2013-08-24 VITALS — BP 122/67 | HR 80 | Temp 97.0°F | Resp 18

## 2013-08-24 DIAGNOSIS — C9000 Multiple myeloma not having achieved remission: Secondary | ICD-10-CM

## 2013-08-24 DIAGNOSIS — Z5112 Encounter for antineoplastic immunotherapy: Secondary | ICD-10-CM

## 2013-08-24 MED ORDER — HEPARIN SOD (PORK) LOCK FLUSH 100 UNIT/ML IV SOLN
500.0000 [IU] | Freq: Once | INTRAVENOUS | Status: AC | PRN
  Administered 2013-08-24: 500 [IU]
  Filled 2013-08-24: qty 5

## 2013-08-24 MED ORDER — ONDANSETRON 8 MG/NS 50 ML IVPB
INTRAVENOUS | Status: AC
  Filled 2013-08-24: qty 8

## 2013-08-24 MED ORDER — DEXTROSE 5 % IV SOLN
27.0000 mg/m2 | Freq: Once | INTRAVENOUS | Status: AC
  Administered 2013-08-24: 54 mg via INTRAVENOUS
  Filled 2013-08-24: qty 27

## 2013-08-24 MED ORDER — SODIUM CHLORIDE 0.9 % IV SOLN
Freq: Once | INTRAVENOUS | Status: AC
  Administered 2013-08-24: 12:00:00 via INTRAVENOUS

## 2013-08-24 MED ORDER — SODIUM CHLORIDE 0.9 % IJ SOLN
10.0000 mL | INTRAMUSCULAR | Status: DC | PRN
  Administered 2013-08-24: 10 mL
  Filled 2013-08-24: qty 10

## 2013-08-24 MED ORDER — DEXAMETHASONE SODIUM PHOSPHATE 10 MG/ML IJ SOLN
10.0000 mg | Freq: Once | INTRAMUSCULAR | Status: AC
  Administered 2013-08-24: 10 mg via INTRAVENOUS

## 2013-08-24 MED ORDER — ONDANSETRON 8 MG/50ML IVPB (CHCC)
8.0000 mg | Freq: Once | INTRAVENOUS | Status: AC
  Administered 2013-08-24: 8 mg via INTRAVENOUS

## 2013-08-24 MED ORDER — DEXAMETHASONE SODIUM PHOSPHATE 10 MG/ML IJ SOLN
INTRAMUSCULAR | Status: AC
  Filled 2013-08-24: qty 1

## 2013-08-26 ENCOUNTER — Other Ambulatory Visit: Payer: Self-pay | Admitting: Gastroenterology

## 2013-08-30 ENCOUNTER — Other Ambulatory Visit (HOSPITAL_BASED_OUTPATIENT_CLINIC_OR_DEPARTMENT_OTHER): Payer: Medicare Other

## 2013-08-30 ENCOUNTER — Ambulatory Visit (HOSPITAL_BASED_OUTPATIENT_CLINIC_OR_DEPARTMENT_OTHER): Payer: Medicare Other

## 2013-08-30 ENCOUNTER — Telehealth: Payer: Self-pay | Admitting: Gastroenterology

## 2013-08-30 VITALS — BP 145/85 | HR 62 | Temp 98.2°F | Resp 18

## 2013-08-30 DIAGNOSIS — Z5111 Encounter for antineoplastic chemotherapy: Secondary | ICD-10-CM

## 2013-08-30 DIAGNOSIS — Z5112 Encounter for antineoplastic immunotherapy: Secondary | ICD-10-CM

## 2013-08-30 DIAGNOSIS — C9002 Multiple myeloma in relapse: Secondary | ICD-10-CM

## 2013-08-30 DIAGNOSIS — C9000 Multiple myeloma not having achieved remission: Secondary | ICD-10-CM

## 2013-08-30 LAB — COMPREHENSIVE METABOLIC PANEL
ALBUMIN: 3.3 g/dL — AB (ref 3.5–5.2)
ALK PHOS: 39 U/L (ref 39–117)
ALT: 9 U/L (ref 0–53)
AST: 13 U/L (ref 0–37)
BUN: 11 mg/dL (ref 6–23)
CO2: 26 meq/L (ref 19–32)
Calcium: 9.2 mg/dL (ref 8.4–10.5)
Chloride: 101 mEq/L (ref 96–112)
Creatinine, Ser: 1.03 mg/dL (ref 0.50–1.35)
GLUCOSE: 154 mg/dL — AB (ref 70–99)
Potassium: 4.1 mEq/L (ref 3.5–5.3)
SODIUM: 139 meq/L (ref 135–145)
TOTAL PROTEIN: 6.2 g/dL (ref 6.0–8.3)
Total Bilirubin: 1.3 mg/dL — ABNORMAL HIGH (ref 0.2–1.2)

## 2013-08-30 LAB — CBC WITH DIFFERENTIAL/PLATELET
BASO%: 0.4 % (ref 0.0–2.0)
BASOS ABS: 0 10*3/uL (ref 0.0–0.1)
EOS ABS: 0 10*3/uL (ref 0.0–0.5)
EOS%: 0.9 % (ref 0.0–7.0)
HCT: 27.9 % — ABNORMAL LOW (ref 38.4–49.9)
HEMOGLOBIN: 8.7 g/dL — AB (ref 13.0–17.1)
LYMPH%: 10.7 % — AB (ref 14.0–49.0)
MCH: 24.1 pg — ABNORMAL LOW (ref 27.2–33.4)
MCHC: 31.3 g/dL — ABNORMAL LOW (ref 32.0–36.0)
MCV: 77 fL — AB (ref 79.3–98.0)
MONO#: 0.2 10*3/uL (ref 0.1–0.9)
MONO%: 5.9 % (ref 0.0–14.0)
NEUT%: 82.1 % — ABNORMAL HIGH (ref 39.0–75.0)
NEUTROS ABS: 3 10*3/uL (ref 1.5–6.5)
PLATELETS: 114 10*3/uL — AB (ref 140–400)
RBC: 3.63 10*6/uL — ABNORMAL LOW (ref 4.20–5.82)
RDW: 15.8 % — ABNORMAL HIGH (ref 11.0–14.6)
WBC: 3.6 10*3/uL — AB (ref 4.0–10.3)
lymph#: 0.4 10*3/uL — ABNORMAL LOW (ref 0.9–3.3)

## 2013-08-30 MED ORDER — SODIUM CHLORIDE 0.9 % IV SOLN
300.0000 mg/m2 | Freq: Once | INTRAVENOUS | Status: AC
  Administered 2013-08-30: 600 mg via INTRAVENOUS
  Filled 2013-08-30: qty 30

## 2013-08-30 MED ORDER — HEPARIN SOD (PORK) LOCK FLUSH 100 UNIT/ML IV SOLN
500.0000 [IU] | Freq: Once | INTRAVENOUS | Status: AC | PRN
  Administered 2013-08-30: 500 [IU]
  Filled 2013-08-30: qty 5

## 2013-08-30 MED ORDER — PANTOPRAZOLE SODIUM 40 MG PO TBEC: 40.0000 mg | DELAYED_RELEASE_TABLET | Freq: Two times a day (BID) | ORAL | Status: DC

## 2013-08-30 MED ORDER — SODIUM CHLORIDE 0.9 % IV SOLN: Freq: Once | INTRAVENOUS | Status: DC

## 2013-08-30 MED ORDER — ONDANSETRON 8 MG/NS 50 ML IVPB
INTRAVENOUS | Status: AC
  Filled 2013-08-30: qty 8

## 2013-08-30 MED ORDER — DEXTROSE 5 % IV SOLN
27.0000 mg/m2 | Freq: Once | INTRAVENOUS | Status: AC
  Administered 2013-08-30: 54 mg via INTRAVENOUS
  Filled 2013-08-30: qty 27

## 2013-08-30 MED ORDER — SODIUM CHLORIDE 0.9 % IJ SOLN
10.0000 mL | INTRAMUSCULAR | Status: DC | PRN
  Administered 2013-08-30: 10 mL
  Filled 2013-08-30: qty 10

## 2013-08-30 MED ORDER — DEXAMETHASONE SODIUM PHOSPHATE 10 MG/ML IJ SOLN
INTRAMUSCULAR | Status: AC
  Filled 2013-08-30: qty 1

## 2013-08-30 MED ORDER — ONDANSETRON 8 MG/50ML IVPB (CHCC)
8.0000 mg | Freq: Once | INTRAVENOUS | Status: AC
  Administered 2013-08-30: 8 mg via INTRAVENOUS

## 2013-08-30 MED ORDER — DEXAMETHASONE SODIUM PHOSPHATE 10 MG/ML IJ SOLN
10.0000 mg | Freq: Once | INTRAMUSCULAR | Status: AC
  Administered 2013-08-30: 10 mg via INTRAVENOUS

## 2013-08-30 MED ORDER — SODIUM CHLORIDE 0.9 % IV SOLN
Freq: Once | INTRAVENOUS | Status: AC
  Administered 2013-08-30: 11:00:00 via INTRAVENOUS

## 2013-08-30 NOTE — Patient Instructions (Signed)
[facility] Discharge Instructions for Patients Receiving Chemotherapy  Today you received the following chemotherapy agents: Cytoxan, kyprolis  To help prevent nausea and vomiting after your treatment, we encourage you to take your nausea medication as prescribed.    If you develop nausea and vomiting that is not controlled by your nausea medication, call the clinic.   BELOW ARE SYMPTOMS THAT SHOULD BE REPORTED IMMEDIATELY:  *FEVER GREATER THAN 100.5 F  *CHILLS WITH OR WITHOUT FEVER  NAUSEA AND VOMITING THAT IS NOT CONTROLLED WITH YOUR NAUSEA MEDICATION  *UNUSUAL SHORTNESS OF BREATH  *UNUSUAL BRUISING OR BLEEDING  TENDERNESS IN MOUTH AND THROAT WITH OR WITHOUT PRESENCE OF ULCERS  *URINARY PROBLEMS  *BOWEL PROBLEMS  UNUSUAL RASH Items with * indicate a potential emergency and should be followed up as soon as possible.  Feel free to call the clinic you have any questions or concerns. The clinic phone number is [phone].

## 2013-08-30 NOTE — Telephone Encounter (Signed)
Sent one refill to patient's pharmacy until scheduled appt.

## 2013-08-31 ENCOUNTER — Ambulatory Visit (HOSPITAL_BASED_OUTPATIENT_CLINIC_OR_DEPARTMENT_OTHER): Payer: Medicare Other

## 2013-08-31 VITALS — BP 129/67 | HR 82 | Temp 98.2°F | Resp 18

## 2013-08-31 DIAGNOSIS — C9000 Multiple myeloma not having achieved remission: Secondary | ICD-10-CM

## 2013-08-31 DIAGNOSIS — Z5112 Encounter for antineoplastic immunotherapy: Secondary | ICD-10-CM

## 2013-08-31 MED ORDER — CARFILZOMIB CHEMO INJECTION 60 MG
27.0000 mg/m2 | Freq: Once | INTRAVENOUS | Status: AC
  Administered 2013-08-31: 54 mg via INTRAVENOUS
  Filled 2013-08-31: qty 27

## 2013-08-31 MED ORDER — SODIUM CHLORIDE 0.9 % IV SOLN
Freq: Once | INTRAVENOUS | Status: AC
  Administered 2013-08-31: 13:00:00 via INTRAVENOUS

## 2013-08-31 MED ORDER — HEPARIN SOD (PORK) LOCK FLUSH 100 UNIT/ML IV SOLN
500.0000 [IU] | Freq: Once | INTRAVENOUS | Status: AC | PRN
  Administered 2013-08-31: 500 [IU]
  Filled 2013-08-31: qty 5

## 2013-08-31 MED ORDER — ONDANSETRON 8 MG/50ML IVPB (CHCC)
8.0000 mg | Freq: Once | INTRAVENOUS | Status: AC
  Administered 2013-08-31: 8 mg via INTRAVENOUS

## 2013-08-31 MED ORDER — DEXAMETHASONE SODIUM PHOSPHATE 10 MG/ML IJ SOLN
INTRAMUSCULAR | Status: AC
  Filled 2013-08-31: qty 1

## 2013-08-31 MED ORDER — ONDANSETRON 8 MG/NS 50 ML IVPB
INTRAVENOUS | Status: AC
  Filled 2013-08-31: qty 8

## 2013-08-31 MED ORDER — DEXAMETHASONE SODIUM PHOSPHATE 10 MG/ML IJ SOLN
10.0000 mg | Freq: Once | INTRAMUSCULAR | Status: AC
  Administered 2013-08-31: 10 mg via INTRAVENOUS

## 2013-08-31 MED ORDER — SODIUM CHLORIDE 0.9 % IJ SOLN
10.0000 mL | INTRAMUSCULAR | Status: DC | PRN
  Administered 2013-08-31: 10 mL
  Filled 2013-08-31: qty 10

## 2013-08-31 NOTE — Patient Instructions (Signed)
[facility] Discharge Instructions for Patients Receiving Chemotherapy  Today you received the following chemotherapy agents kyprolis  To help prevent nausea and vomiting after your treatment, we encourage you to take your nausea medication as directed   If you develop nausea and vomiting that is not controlled by your nausea medication, call the clinic.   BELOW ARE SYMPTOMS THAT SHOULD BE REPORTED IMMEDIATELY:  *FEVER GREATER THAN 100.5 F  *CHILLS WITH OR WITHOUT FEVER  NAUSEA AND VOMITING THAT IS NOT CONTROLLED WITH YOUR NAUSEA MEDICATION  *UNUSUAL SHORTNESS OF BREATH  *UNUSUAL BRUISING OR BLEEDING  TENDERNESS IN MOUTH AND THROAT WITH OR WITHOUT PRESENCE OF ULCERS  *URINARY PROBLEMS  *BOWEL PROBLEMS  UNUSUAL RASH Items with * indicate a potential emergency and should be followed up as soon as possible.  Feel free to call the clinic you have any questions or concerns. The clinic phone number is [phone].  

## 2013-09-06 ENCOUNTER — Ambulatory Visit (HOSPITAL_BASED_OUTPATIENT_CLINIC_OR_DEPARTMENT_OTHER): Payer: Medicare Other

## 2013-09-06 ENCOUNTER — Other Ambulatory Visit (HOSPITAL_BASED_OUTPATIENT_CLINIC_OR_DEPARTMENT_OTHER): Payer: Medicare Other

## 2013-09-06 VITALS — BP 154/81 | HR 64 | Temp 98.5°F | Resp 16

## 2013-09-06 DIAGNOSIS — C9002 Multiple myeloma in relapse: Secondary | ICD-10-CM

## 2013-09-06 DIAGNOSIS — Z5111 Encounter for antineoplastic chemotherapy: Secondary | ICD-10-CM

## 2013-09-06 DIAGNOSIS — C9000 Multiple myeloma not having achieved remission: Secondary | ICD-10-CM

## 2013-09-06 DIAGNOSIS — Z5112 Encounter for antineoplastic immunotherapy: Secondary | ICD-10-CM

## 2013-09-06 LAB — COMPREHENSIVE METABOLIC PANEL (CC13)
ALBUMIN: 3.4 g/dL — AB (ref 3.5–5.0)
ALT: 8 U/L (ref 0–55)
AST: 13 U/L (ref 5–34)
Alkaline Phosphatase: 36 U/L — ABNORMAL LOW (ref 40–150)
Anion Gap: 7 mEq/L (ref 3–11)
BUN: 11.1 mg/dL (ref 7.0–26.0)
CO2: 26 mEq/L (ref 22–29)
Calcium: 9.1 mg/dL (ref 8.4–10.4)
Chloride: 106 mEq/L (ref 98–109)
Creatinine: 1 mg/dL (ref 0.7–1.3)
Glucose: 131 mg/dl (ref 70–140)
POTASSIUM: 4.5 meq/L (ref 3.5–5.1)
SODIUM: 139 meq/L (ref 136–145)
TOTAL PROTEIN: 6.3 g/dL — AB (ref 6.4–8.3)
Total Bilirubin: 1.41 mg/dL — ABNORMAL HIGH (ref 0.20–1.20)

## 2013-09-06 LAB — CBC WITH DIFFERENTIAL/PLATELET
BASO%: 0.5 % (ref 0.0–2.0)
Basophils Absolute: 0 10*3/uL (ref 0.0–0.1)
EOS ABS: 0 10*3/uL (ref 0.0–0.5)
EOS%: 0.6 % (ref 0.0–7.0)
HCT: 28.8 % — ABNORMAL LOW (ref 38.4–49.9)
HGB: 9 g/dL — ABNORMAL LOW (ref 13.0–17.1)
LYMPH%: 16.4 % (ref 14.0–49.0)
MCH: 24.2 pg — AB (ref 27.2–33.4)
MCHC: 31.2 g/dL — ABNORMAL LOW (ref 32.0–36.0)
MCV: 77.6 fL — AB (ref 79.3–98.0)
MONO#: 0.3 10*3/uL (ref 0.1–0.9)
MONO%: 8.9 % (ref 0.0–14.0)
NEUT#: 2.8 10*3/uL (ref 1.5–6.5)
NEUT%: 73.6 % (ref 39.0–75.0)
Platelets: 133 10*3/uL — ABNORMAL LOW (ref 140–400)
RBC: 3.71 10*6/uL — ABNORMAL LOW (ref 4.20–5.82)
RDW: 15.6 % — AB (ref 11.0–14.6)
WBC: 3.8 10*3/uL — ABNORMAL LOW (ref 4.0–10.3)
lymph#: 0.6 10*3/uL — ABNORMAL LOW (ref 0.9–3.3)

## 2013-09-06 MED ORDER — SODIUM CHLORIDE 0.9 % IJ SOLN
10.0000 mL | INTRAMUSCULAR | Status: DC | PRN
  Administered 2013-09-06: 10 mL
  Filled 2013-09-06: qty 10

## 2013-09-06 MED ORDER — DEXAMETHASONE SODIUM PHOSPHATE 10 MG/ML IJ SOLN
INTRAMUSCULAR | Status: AC
  Filled 2013-09-06: qty 1

## 2013-09-06 MED ORDER — DEXAMETHASONE SODIUM PHOSPHATE 10 MG/ML IJ SOLN
10.0000 mg | Freq: Once | INTRAMUSCULAR | Status: AC
  Administered 2013-09-06: 10 mg via INTRAVENOUS

## 2013-09-06 MED ORDER — CYCLOPHOSPHAMIDE CHEMO INJECTION 1 GM
300.0000 mg/m2 | Freq: Once | INTRAMUSCULAR | Status: AC
  Administered 2013-09-06: 600 mg via INTRAVENOUS
  Filled 2013-09-06: qty 30

## 2013-09-06 MED ORDER — ONDANSETRON 8 MG/50ML IVPB (CHCC)
8.0000 mg | Freq: Once | INTRAVENOUS | Status: AC
  Administered 2013-09-06: 8 mg via INTRAVENOUS

## 2013-09-06 MED ORDER — SODIUM CHLORIDE 0.9 % IV SOLN
Freq: Once | INTRAVENOUS | Status: AC
  Administered 2013-09-06: 11:00:00 via INTRAVENOUS

## 2013-09-06 MED ORDER — SODIUM CHLORIDE 0.9 % IV SOLN
Freq: Once | INTRAVENOUS | Status: AC
  Administered 2013-09-06: 10:00:00 via INTRAVENOUS

## 2013-09-06 MED ORDER — ONDANSETRON 8 MG/NS 50 ML IVPB
INTRAVENOUS | Status: AC
  Filled 2013-09-06: qty 8

## 2013-09-06 MED ORDER — CARFILZOMIB CHEMO INJECTION 60 MG
27.0000 mg/m2 | Freq: Once | INTRAVENOUS | Status: AC
  Administered 2013-09-06: 54 mg via INTRAVENOUS
  Filled 2013-09-06: qty 27

## 2013-09-06 MED ORDER — HEPARIN SOD (PORK) LOCK FLUSH 100 UNIT/ML IV SOLN
500.0000 [IU] | Freq: Once | INTRAVENOUS | Status: AC | PRN
  Administered 2013-09-06: 500 [IU]
  Filled 2013-09-06: qty 5

## 2013-09-06 NOTE — Patient Instructions (Signed)
[facility] for [facility] Receiving Chemotherapy  Today you received the following chemotherapy agents Kyprolis/Cytoxan.   To help prevent nausea and vomiting after your treatment, we encourage you to take your nausea medication as directed.    If you develop nausea and vomiting that is not controlled by your nausea medication, call the clinic.   BELOW ARE SYMPTOMS THAT SHOULD BE REPORTED IMMEDIATELY:  *FEVER GREATER THAN 100.5 F  *CHILLS WITH OR WITHOUT FEVER  NAUSEA AND VOMITING THAT IS NOT CONTROLLED WITH YOUR NAUSEA MEDICATION  *UNUSUAL SHORTNESS OF BREATH  *UNUSUAL BRUISING OR BLEEDING  TENDERNESS IN MOUTH AND THROAT WITH OR WITHOUT PRESENCE OF ULCERS  *URINARY PROBLEMS  *BOWEL PROBLEMS  UNUSUAL RASH Items with * indicate a potential emergency and should be followed up as soon as possible.  Feel free to call the clinic you have any questions or concerns. The clinic phone number is [phone].    

## 2013-09-07 ENCOUNTER — Ambulatory Visit (HOSPITAL_BASED_OUTPATIENT_CLINIC_OR_DEPARTMENT_OTHER): Payer: Medicare Other

## 2013-09-07 VITALS — BP 143/81 | HR 88 | Temp 97.8°F | Resp 20

## 2013-09-07 DIAGNOSIS — Z5112 Encounter for antineoplastic immunotherapy: Secondary | ICD-10-CM

## 2013-09-07 DIAGNOSIS — C9002 Multiple myeloma in relapse: Secondary | ICD-10-CM

## 2013-09-07 DIAGNOSIS — C9000 Multiple myeloma not having achieved remission: Secondary | ICD-10-CM

## 2013-09-07 MED ORDER — SODIUM CHLORIDE 0.9 % IV SOLN
Freq: Once | INTRAVENOUS | Status: AC
  Administered 2013-09-07: 14:00:00 via INTRAVENOUS

## 2013-09-07 MED ORDER — DEXAMETHASONE SODIUM PHOSPHATE 10 MG/ML IJ SOLN
10.0000 mg | Freq: Once | INTRAMUSCULAR | Status: AC
  Administered 2013-09-07: 10 mg via INTRAVENOUS

## 2013-09-07 MED ORDER — DEXAMETHASONE SODIUM PHOSPHATE 10 MG/ML IJ SOLN
INTRAMUSCULAR | Status: AC
  Filled 2013-09-07: qty 1

## 2013-09-07 MED ORDER — SODIUM CHLORIDE 0.9 % IJ SOLN
10.0000 mL | INTRAMUSCULAR | Status: DC | PRN
  Administered 2013-09-07: 10 mL
  Filled 2013-09-07: qty 10

## 2013-09-07 MED ORDER — ONDANSETRON 8 MG/NS 50 ML IVPB
INTRAVENOUS | Status: AC
  Filled 2013-09-07: qty 8

## 2013-09-07 MED ORDER — DEXTROSE 5 % IV SOLN
27.0000 mg/m2 | Freq: Once | INTRAVENOUS | Status: AC
  Administered 2013-09-07: 54 mg via INTRAVENOUS
  Filled 2013-09-07: qty 27

## 2013-09-07 MED ORDER — HEPARIN SOD (PORK) LOCK FLUSH 100 UNIT/ML IV SOLN
500.0000 [IU] | Freq: Once | INTRAVENOUS | Status: AC | PRN
  Administered 2013-09-07: 500 [IU]
  Filled 2013-09-07: qty 5

## 2013-09-07 MED ORDER — ONDANSETRON 8 MG/50ML IVPB (CHCC)
8.0000 mg | Freq: Once | INTRAVENOUS | Status: AC
  Administered 2013-09-07: 8 mg via INTRAVENOUS

## 2013-09-13 ENCOUNTER — Other Ambulatory Visit: Payer: Medicare Other

## 2013-09-13 ENCOUNTER — Other Ambulatory Visit: Payer: Self-pay | Admitting: Physician Assistant

## 2013-09-13 DIAGNOSIS — C9000 Multiple myeloma not having achieved remission: Secondary | ICD-10-CM

## 2013-09-14 ENCOUNTER — Other Ambulatory Visit (HOSPITAL_BASED_OUTPATIENT_CLINIC_OR_DEPARTMENT_OTHER): Payer: Medicare Other

## 2013-09-14 DIAGNOSIS — C9002 Multiple myeloma in relapse: Secondary | ICD-10-CM

## 2013-09-14 LAB — COMPREHENSIVE METABOLIC PANEL (CC13)
ALBUMIN: 3.3 g/dL — AB (ref 3.5–5.0)
ALT: 11 U/L (ref 0–55)
AST: 15 U/L (ref 5–34)
Alkaline Phosphatase: 38 U/L — ABNORMAL LOW (ref 40–150)
Anion Gap: 8 mEq/L (ref 3–11)
BUN: 9.9 mg/dL (ref 7.0–26.0)
CHLORIDE: 106 meq/L (ref 98–109)
CO2: 24 mEq/L (ref 22–29)
Calcium: 9.2 mg/dL (ref 8.4–10.4)
Creatinine: 0.9 mg/dL (ref 0.7–1.3)
Glucose: 144 mg/dl — ABNORMAL HIGH (ref 70–140)
POTASSIUM: 3.9 meq/L (ref 3.5–5.1)
SODIUM: 138 meq/L (ref 136–145)
TOTAL PROTEIN: 6.1 g/dL — AB (ref 6.4–8.3)
Total Bilirubin: 1.26 mg/dL — ABNORMAL HIGH (ref 0.20–1.20)

## 2013-09-14 LAB — CBC WITH DIFFERENTIAL/PLATELET
BASO%: 0.4 % (ref 0.0–2.0)
BASOS ABS: 0 10*3/uL (ref 0.0–0.1)
EOS%: 1 % (ref 0.0–7.0)
Eosinophils Absolute: 0 10*3/uL (ref 0.0–0.5)
HCT: 26.9 % — ABNORMAL LOW (ref 38.4–49.9)
HEMOGLOBIN: 8.5 g/dL — AB (ref 13.0–17.1)
LYMPH#: 0.5 10*3/uL — AB (ref 0.9–3.3)
LYMPH%: 14.8 % (ref 14.0–49.0)
MCH: 24.8 pg — ABNORMAL LOW (ref 27.2–33.4)
MCHC: 31.6 g/dL — ABNORMAL LOW (ref 32.0–36.0)
MCV: 78.5 fL — AB (ref 79.3–98.0)
MONO#: 0.4 10*3/uL (ref 0.1–0.9)
MONO%: 12.2 % (ref 0.0–14.0)
NEUT#: 2.2 10*3/uL (ref 1.5–6.5)
NEUT%: 71.6 % (ref 39.0–75.0)
Platelets: 140 10*3/uL (ref 140–400)
RBC: 3.42 10*6/uL — AB (ref 4.20–5.82)
RDW: 16.2 % — AB (ref 11.0–14.6)
WBC: 3.1 10*3/uL — ABNORMAL LOW (ref 4.0–10.3)

## 2013-09-20 ENCOUNTER — Other Ambulatory Visit (HOSPITAL_BASED_OUTPATIENT_CLINIC_OR_DEPARTMENT_OTHER): Payer: Medicare Other

## 2013-09-20 ENCOUNTER — Ambulatory Visit (HOSPITAL_BASED_OUTPATIENT_CLINIC_OR_DEPARTMENT_OTHER): Payer: Medicare Other | Admitting: Internal Medicine

## 2013-09-20 ENCOUNTER — Telehealth: Payer: Self-pay | Admitting: Internal Medicine

## 2013-09-20 ENCOUNTER — Ambulatory Visit (HOSPITAL_BASED_OUTPATIENT_CLINIC_OR_DEPARTMENT_OTHER): Payer: Medicare Other

## 2013-09-20 ENCOUNTER — Encounter: Payer: Self-pay | Admitting: Internal Medicine

## 2013-09-20 VITALS — BP 145/76 | HR 68 | Temp 97.9°F | Resp 18 | Ht 73.0 in | Wt 186.3 lb

## 2013-09-20 DIAGNOSIS — M549 Dorsalgia, unspecified: Secondary | ICD-10-CM

## 2013-09-20 DIAGNOSIS — C9000 Multiple myeloma not having achieved remission: Secondary | ICD-10-CM

## 2013-09-20 DIAGNOSIS — C9002 Multiple myeloma in relapse: Secondary | ICD-10-CM

## 2013-09-20 DIAGNOSIS — Z5112 Encounter for antineoplastic immunotherapy: Secondary | ICD-10-CM

## 2013-09-20 DIAGNOSIS — Z5111 Encounter for antineoplastic chemotherapy: Secondary | ICD-10-CM

## 2013-09-20 LAB — CBC WITH DIFFERENTIAL/PLATELET
BASO%: 0.4 % (ref 0.0–2.0)
Basophils Absolute: 0 10*3/uL (ref 0.0–0.1)
EOS ABS: 0 10*3/uL (ref 0.0–0.5)
EOS%: 1.2 % (ref 0.0–7.0)
HCT: 29.1 % — ABNORMAL LOW (ref 38.4–49.9)
HGB: 9.1 g/dL — ABNORMAL LOW (ref 13.0–17.1)
LYMPH%: 23.5 % (ref 14.0–49.0)
MCH: 24.3 pg — ABNORMAL LOW (ref 27.2–33.4)
MCHC: 31.3 g/dL — ABNORMAL LOW (ref 32.0–36.0)
MCV: 77.8 fL — AB (ref 79.3–98.0)
MONO#: 0.2 10*3/uL (ref 0.1–0.9)
MONO%: 9.2 % (ref 0.0–14.0)
NEUT#: 1.7 10*3/uL (ref 1.5–6.5)
NEUT%: 65.7 % (ref 39.0–75.0)
NRBC: 0 % (ref 0–0)
Platelets: 128 10*3/uL — ABNORMAL LOW (ref 140–400)
RBC: 3.74 10*6/uL — AB (ref 4.20–5.82)
RDW: 15.9 % — ABNORMAL HIGH (ref 11.0–14.6)
WBC: 2.6 10*3/uL — ABNORMAL LOW (ref 4.0–10.3)
lymph#: 0.6 10*3/uL — ABNORMAL LOW (ref 0.9–3.3)

## 2013-09-20 LAB — COMPREHENSIVE METABOLIC PANEL (CC13)
ALT: 16 U/L (ref 0–55)
ANION GAP: 9 meq/L (ref 3–11)
AST: 18 U/L (ref 5–34)
Albumin: 3.4 g/dL — ABNORMAL LOW (ref 3.5–5.0)
Alkaline Phosphatase: 35 U/L — ABNORMAL LOW (ref 40–150)
BILIRUBIN TOTAL: 1.3 mg/dL — AB (ref 0.20–1.20)
BUN: 9.8 mg/dL (ref 7.0–26.0)
CO2: 25 meq/L (ref 22–29)
CREATININE: 1 mg/dL (ref 0.7–1.3)
Calcium: 8.8 mg/dL (ref 8.4–10.4)
Chloride: 106 mEq/L (ref 98–109)
Glucose: 151 mg/dl — ABNORMAL HIGH (ref 70–140)
Potassium: 3.9 mEq/L (ref 3.5–5.1)
SODIUM: 139 meq/L (ref 136–145)
TOTAL PROTEIN: 6.6 g/dL (ref 6.4–8.3)

## 2013-09-20 LAB — TECHNOLOGIST REVIEW

## 2013-09-20 MED ORDER — DEXAMETHASONE SODIUM PHOSPHATE 10 MG/ML IJ SOLN
10.0000 mg | Freq: Once | INTRAMUSCULAR | Status: AC
  Administered 2013-09-20: 10 mg via INTRAVENOUS

## 2013-09-20 MED ORDER — SODIUM CHLORIDE 0.9 % IV SOLN
300.0000 mg/m2 | Freq: Once | INTRAVENOUS | Status: AC
  Administered 2013-09-20: 600 mg via INTRAVENOUS
  Filled 2013-09-20: qty 30

## 2013-09-20 MED ORDER — SODIUM CHLORIDE 0.9 % IJ SOLN
10.0000 mL | INTRAMUSCULAR | Status: DC | PRN
  Administered 2013-09-20: 10 mL
  Filled 2013-09-20: qty 10

## 2013-09-20 MED ORDER — HEPARIN SOD (PORK) LOCK FLUSH 100 UNIT/ML IV SOLN
500.0000 [IU] | Freq: Once | INTRAVENOUS | Status: AC | PRN
  Administered 2013-09-20: 500 [IU]
  Filled 2013-09-20: qty 5

## 2013-09-20 MED ORDER — SODIUM CHLORIDE 0.9 % IV SOLN
Freq: Once | INTRAVENOUS | Status: AC
  Administered 2013-09-20: 10:00:00 via INTRAVENOUS

## 2013-09-20 MED ORDER — ZOLEDRONIC ACID 4 MG/5ML IV CONC
4.0000 mg | Freq: Once | INTRAVENOUS | Status: AC
  Administered 2013-09-20: 4 mg via INTRAVENOUS
  Filled 2013-09-20: qty 5

## 2013-09-20 MED ORDER — DEXAMETHASONE SODIUM PHOSPHATE 10 MG/ML IJ SOLN
INTRAMUSCULAR | Status: AC
  Filled 2013-09-20: qty 1

## 2013-09-20 MED ORDER — DEXTROSE 5 % IV SOLN
27.0000 mg/m2 | Freq: Once | INTRAVENOUS | Status: AC
  Administered 2013-09-20: 54 mg via INTRAVENOUS
  Filled 2013-09-20: qty 27

## 2013-09-20 MED ORDER — ONDANSETRON 8 MG/50ML IVPB (CHCC)
8.0000 mg | Freq: Once | INTRAVENOUS | Status: AC
  Administered 2013-09-20: 8 mg via INTRAVENOUS

## 2013-09-20 MED ORDER — ONDANSETRON 8 MG/NS 50 ML IVPB
INTRAVENOUS | Status: AC
  Filled 2013-09-20: qty 8

## 2013-09-20 NOTE — Progress Notes (Signed)
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   PRIOR THERAPY: :  1. Status post 6 cycles of systemic chemotherapy with Revlimid and Decadron, last dose was given 07/21/2010 with very good response. 2. 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 [name].  3. maintenance Revlimid at 10 mg by mouth daily status post 2 months. Therapy began 01/18/2011. 4. maintenance Revlimid at 15 mg by mouth daily with prophylactic dose Coumadin at 2 mg by mouth daily. 5. Systemic chemotherapy with Velcade at 1.3 mg per meter squared given on days 1, 4, 8 and 11 and Doxil at 30 mg per meter square given on day 4 and Decadron 40 mg by mouth on weekly basis given every 3 weeks. Status post 4 cycles. 6. Zometa 4 mg IV every 4 weeks.  7. Velcade 1.3 mg/M2 subcutaneous daily on a weekly basis with Decadron 20 mg by mouth on a weekly basis. First cycle expected on 06/21/2012. S/P 4 cycles.  CURRENT THERAPY:  1) Systemic chemotherapy with Carfilzomib, cyclophosphamide and Decadron. First cycle started on 08/09/2012. He is status post 12 cycles. 2) Zometa every 8 weeks. Next dose 05/27/2013.   INTERVAL HISTORY: Eddie Thomas 68 y.o. male returns to the clinic today for followup visit. He is currently on treatment with Carfilzomib, Cytoxan and dexamethasone status post 12 cycles and tolerating his treatment fairly well. The patient is feeling fine with no specific complaints except for occasional low back pain with radiation to the right leg. He is scheduled to see Dr. [name] at [facility] in a few weeks for evaluation of his condition and followup on his previous transplant. He is expected to have myeloma panel performed during his visit in [place].  He denied having any significant weight loss or night sweats. He has no fever or chills. He has no nausea or vomiting. The patient denied having any significant chest pain, shortness of breath, cough or hemoptysis.   MEDICAL HISTORY: Past Medical History  Diagnosis Date  . Multiple myeloma   . Diabetes mellitus 07/03/2011  . Hypertension 07/03/2011  . Hyperlipidemia 07/03/2011  . Colon polyps 2012  . Gastric ulcer     ALLERGIES:  has No Known Allergies.  MEDICATIONS:  Current Outpatient Prescriptions  Medication Sig Dispense Refill  . lisinopril (PRINIVIL,ZESTRIL) 10 MG tablet Take 5 mg by mouth every evening.       [name] oxyCODONE-acetaminophen (PERCOCET/ROXICET) 5-325 MG per tablet Take 1 tablet by mouth every 6 (six) hours as needed for severe pain.  30 tablet  0  . pantoprazole (PROTONIX) 40 MG tablet Take 1 tablet (40 mg total) by mouth 2 (two) times daily.  60 tablet  0  . pioglitazone (ACTOS) 15 MG tablet Take 15 mg by mouth daily.      . prochlorperazine (COMPAZINE) 10 MG tablet Take 10 mg by mouth every 6 (six) hours as needed for nausea or vomiting.      . simvastatin (ZOCOR) 10 MG tablet Take 10 mg by mouth at bedtime.        . temazepam (RESTORIL) 15 MG capsule Take 15 mg by mouth at bedtime as needed for sleep.      . valACYclovir (VALTREX) 500 MG tablet TAKE ONE TABLET BY MOUTH  ONCE DAILY  30 tablet  1  . VIAGRA 50 MG tablet Take 50 mg by mouth daily as needed for erectile dysfunction.        No current facility-administered medications for this visit.    SURGICAL HISTORY:  Past Surgical History  Procedure Laterality Date  . Limbal stem cell transplant    . Humerus fracture surgery      right  . 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    REVIEW OF SYSTEMS:  Constitutional: negative Eyes: negative Ears, nose, mouth, throat, and face: negative Respiratory: negative Cardiovascular: negative Gastrointestinal:  negative Genitourinary:negative Integument/breast: negative Hematologic/lymphatic: negative Musculoskeletal:negative Neurological: negative Behavioral/Psych: negative Endocrine: negative Allergic/Immunologic: negative   PHYSICAL EXAMINATION: General appearance: alert, cooperative and no distress Head: Normocephalic, without obvious abnormality, atraumatic Neck: no adenopathy, no JVD, supple, symmetrical, trachea midline and thyroid not enlarged, symmetric, no tenderness/mass/nodules Lymph nodes: Cervical, supraclavicular, and axillary nodes normal. Resp: clear to auscultation bilaterally Back: symmetric, no curvature. ROM normal. No CVA tenderness. Cardio: regular rate and rhythm, S1, S2 normal, no murmur, click, rub or gallop GI: soft, non-tender; bowel sounds normal; no masses,  no organomegaly Extremities: extremities normal, atraumatic, no cyanosis or edema Neurologic: Alert and oriented X 3, normal strength and tone. Normal symmetric reflexes. Normal coordination and gait  ECOG PERFORMANCE STATUS: 1 - Symptomatic but completely ambulatory  Blood pressure 145/76, pulse 68, temperature 97.9 F (36.6 C), temperature source Oral, resp. rate 18, height 6' 1"  (1.854 m), weight 186 lb 4.8 oz (84.505 kg).  LABORATORY DATA: Lab Results  Component Value Date   WBC 2.6* 09/20/2013   HGB 9.1* 09/20/2013   HCT 29.1* 09/20/2013   MCV 77.8* 09/20/2013   PLT 128* 09/20/2013      Chemistry      Component Value Date/Time   NA 139 09/20/2013 0807   NA 139 08/30/2013 0935   K 3.9 09/20/2013 0807   K 4.1 08/30/2013 0935   CL 101 08/30/2013 0935   CL 103 07/12/2012 0907   CO2 25 09/20/2013 0807   CO2 26 08/30/2013 0935   BUN 9.8 09/20/2013 0807   BUN 11 08/30/2013 0935   CREATININE 1.0 09/20/2013 0807   CREATININE 1.03 08/30/2013 0935      Component Value Date/Time   CALCIUM 8.8 09/20/2013 0807   CALCIUM 9.2 08/30/2013 0935   ALKPHOS 35* 09/20/2013 0807   ALKPHOS 39 08/30/2013 0935   AST 18 09/20/2013 0807    AST 13 08/30/2013 0935   ALT 16 09/20/2013 0807   ALT 9 08/30/2013 0935   BILITOT 1.30* 09/20/2013 0807   BILITOT 1.3* 08/30/2013 0935     Other lab results:   RADIOGRAPHIC STUDIES: No results found.  ASSESSMENT AND PLAN: This is a very pleasant 68 years old [place] American male with history of multiple myeloma currently undergoing systemic chemotherapy with Carfilzomib, cyclophosphamide and Decadron status post 12 cycles.  I recommended for him to continue his current treatment. He would have repeat myeloma panel performed [facility] during his visit in few weeks. Here to start cycle #13 today. He would come back for followup visit in 1 month for reevaluation. He'll continue on Zometa every 2 months. He was advised to call immediately if he has any concerning symptoms in the interval.  The patient voices understanding of current disease status and treatment options and is in agreement with the current care plan. All questions were answered. The patient knows to call the clinic  with any problems, questions or concerns. We can certainly see the patient much sooner if necessary.  Disclaimer: This note was dictated with voice recognition software. Similar sounding words can inadvertently be transcribed and may not be corrected upon review.

## 2013-09-20 NOTE — Telephone Encounter (Signed)
gv and printed appt schedand avs for pt for Sept °

## 2013-09-20 NOTE — Patient Instructions (Signed)
[facility] Discharge Instructions for Patients Receiving Chemotherapy  Today you received the following chemotherapy agents Kyprolis and Cytoxan.   To help prevent nausea and vomiting after your treatment, we encourage you to take your nausea medication.   If you develop nausea and vomiting that is not controlled by your nausea medication, call the clinic.   BELOW ARE SYMPTOMS THAT SHOULD BE REPORTED IMMEDIATELY:  *FEVER GREATER THAN 100.5 F  *CHILLS WITH OR WITHOUT FEVER  NAUSEA AND VOMITING THAT IS NOT CONTROLLED WITH YOUR NAUSEA MEDICATION  *UNUSUAL SHORTNESS OF BREATH  *UNUSUAL BRUISING OR BLEEDING  TENDERNESS IN MOUTH AND THROAT WITH OR WITHOUT PRESENCE OF ULCERS  *URINARY PROBLEMS  *BOWEL PROBLEMS  UNUSUAL RASH Items with * indicate a potential emergency and should be followed up as soon as possible.  Feel free to call the clinic you have any questions or concerns. The clinic phone number is [phone].     Zoledronic Acid injection (Hypercalcemia, Oncology) What is this medicine? ZOLEDRONIC ACID (ZOE le dron ik AS id) lowers the amount of calcium loss from bone. It is used to treat too much calcium in your blood from cancer. It is also used to prevent complications of cancer that has spread to the bone. This medicine may be used for other purposes; ask your health care provider or pharmacist if you have questions. COMMON BRAND NAME(S): Zometa What should I tell my health care provider before I take this medicine? They need to know if you have any of these conditions: -aspirin-sensitive asthma -cancer, especially if you are receiving medicines used to treat cancer -dental disease or wear dentures -infection -kidney disease -receiving corticosteroids like dexamethasone or prednisone -an unusual or allergic reaction to zoledronic acid, other medicines, foods, dyes, or preservatives -pregnant or trying to get pregnant -breast-feeding How should  I use this medicine? This medicine is for infusion into a vein. It is given by a health care professional in a hospital or clinic setting. Talk to your pediatrician regarding the use of this medicine in children. Special care may be needed. Overdosage: If you think you have taken too much of this medicine contact a poison control center or emergency room at once. NOTE: This medicine is only for you. Do not share this medicine with others. What if I miss a dose? It is important not to miss your dose. Call your doctor or health care professional if you are unable to keep an appointment. What may interact with this medicine? -certain antibiotics given by injection -NSAIDs, medicines for pain and inflammation, like ibuprofen or naproxen -some diuretics like bumetanide, furosemide -teriparatide -thalidomide This list may not describe all possible interactions. Give your health care provider a list of all the medicines, herbs, non-prescription drugs, or dietary supplements you use. Also tell them if you smoke, drink alcohol, or use illegal drugs. Some items may interact with your medicine. What should I watch for while using this medicine? Visit your doctor or health care professional for regular checkups. It may be some time before you see the benefit from this medicine. Do not stop taking your medicine unless your doctor tells you to. Your doctor may order blood tests or other tests to see how you are doing. Women should inform their doctor if they wish to become pregnant or think they might be pregnant. There is a potential for serious side effects to an unborn child. Talk to your health care professional or pharmacist for more information. You should make sure  that you get enough calcium and vitamin D while you are taking this medicine. Discuss the foods you eat and the vitamins you take with your health care professional. Some people who take this medicine have severe bone, joint, and/or muscle  pain. This medicine may also increase your risk for jaw problems or a broken thigh bone. Tell your doctor right away if you have severe pain in your jaw, bones, joints, or muscles. Tell your doctor if you have any pain that does not go away or that gets worse. Tell your dentist and dental surgeon that you are taking this medicine. You should not have major dental surgery while on this medicine. See your dentist to have a dental exam and fix any dental problems before starting this medicine. Take good care of your teeth while on this medicine. Make sure you see your dentist for regular follow-up appointments. What side effects may I notice from receiving this medicine? Side effects that you should report to your doctor or health care professional as soon as possible: -allergic reactions like skin rash, itching or hives, swelling of the face, lips, or tongue -anxiety, confusion, or depression -breathing problems -changes in vision -eye pain -feeling faint or lightheaded, falls -jaw pain, especially after dental work -mouth sores -muscle cramps, stiffness, or weakness -trouble passing urine or change in the amount of urine Side effects that usually do not require medical attention (report to your doctor or health care professional if they continue or are bothersome): -bone, joint, or muscle pain -constipation -diarrhea -fever -hair loss -irritation at site where injected -loss of appetite -nausea, vomiting -stomach upset -trouble sleeping -trouble swallowing -weak or tired This list may not describe all possible side effects. Call your doctor for medical advice about side effects. You may report side effects to FDA at 1-800-FDA-1088. Where should I keep my medicine? This drug is given in a hospital or clinic and will not be stored at home. NOTE: This sheet is a summary. It may not cover all possible information. If you have questions about this medicine, talk to your doctor, pharmacist, or  health care provider.  2015, [employer]. (2012-06-17 13:03:13)

## 2013-09-21 ENCOUNTER — Ambulatory Visit (HOSPITAL_BASED_OUTPATIENT_CLINIC_OR_DEPARTMENT_OTHER): Payer: Medicare Other

## 2013-09-21 ENCOUNTER — Other Ambulatory Visit: Payer: Self-pay | Admitting: Internal Medicine

## 2013-09-21 VITALS — BP 144/76 | HR 74 | Temp 97.9°F

## 2013-09-21 DIAGNOSIS — Z23 Encounter for immunization: Secondary | ICD-10-CM

## 2013-09-21 DIAGNOSIS — C9000 Multiple myeloma not having achieved remission: Secondary | ICD-10-CM

## 2013-09-21 DIAGNOSIS — Z5112 Encounter for antineoplastic immunotherapy: Secondary | ICD-10-CM

## 2013-09-21 MED ORDER — SODIUM CHLORIDE 0.9 % IV SOLN
Freq: Once | INTRAVENOUS | Status: AC
  Administered 2013-09-21: 09:00:00 via INTRAVENOUS

## 2013-09-21 MED ORDER — ONDANSETRON 8 MG/50ML IVPB (CHCC)
8.0000 mg | Freq: Once | INTRAVENOUS | Status: AC
  Administered 2013-09-21: 8 mg via INTRAVENOUS

## 2013-09-21 MED ORDER — DEXAMETHASONE SODIUM PHOSPHATE 10 MG/ML IJ SOLN
INTRAMUSCULAR | Status: AC
  Filled 2013-09-21: qty 1

## 2013-09-21 MED ORDER — ONDANSETRON 8 MG/NS 50 ML IVPB
INTRAVENOUS | Status: AC
  Filled 2013-09-21: qty 8

## 2013-09-21 MED ORDER — SODIUM CHLORIDE 0.9 % IJ SOLN
10.0000 mL | INTRAMUSCULAR | Status: DC | PRN
  Administered 2013-09-21: 10 mL
  Filled 2013-09-21: qty 10

## 2013-09-21 MED ORDER — HEPARIN SOD (PORK) LOCK FLUSH 100 UNIT/ML IV SOLN
500.0000 [IU] | Freq: Once | INTRAVENOUS | Status: AC | PRN
  Administered 2013-09-21: 500 [IU]
  Filled 2013-09-21: qty 5

## 2013-09-21 MED ORDER — DEXTROSE 5 % IV SOLN
27.0000 mg/m2 | Freq: Once | INTRAVENOUS | Status: AC
  Administered 2013-09-21: 54 mg via INTRAVENOUS
  Filled 2013-09-21: qty 27

## 2013-09-21 MED ORDER — INFLUENZA VAC SPLIT QUAD 0.5 ML IM SUSY
0.5000 mL | PREFILLED_SYRINGE | Freq: Once | INTRAMUSCULAR | Status: AC
  Administered 2013-09-21: 0.5 mL via INTRAMUSCULAR
  Filled 2013-09-21: qty 0.5

## 2013-09-21 MED ORDER — DEXAMETHASONE SODIUM PHOSPHATE 10 MG/ML IJ SOLN
10.0000 mg | Freq: Once | INTRAMUSCULAR | Status: AC
  Administered 2013-09-21: 10 mg via INTRAVENOUS

## 2013-09-21 NOTE — Patient Instructions (Signed)
[facility] Discharge Instructions for Patients Receiving Chemotherapy  Today you received the following chemotherapy agents kyprolis  To help prevent nausea and vomiting after your treatment, we encourage you to take your nausea medication as needed   If you develop nausea and vomiting that is not controlled by your nausea medication, call the clinic.   BELOW ARE SYMPTOMS THAT SHOULD BE REPORTED IMMEDIATELY:  *FEVER GREATER THAN 100.5 F  *CHILLS WITH OR WITHOUT FEVER  NAUSEA AND VOMITING THAT IS NOT CONTROLLED WITH YOUR NAUSEA MEDICATION  *UNUSUAL SHORTNESS OF BREATH  *UNUSUAL BRUISING OR BLEEDING  TENDERNESS IN MOUTH AND THROAT WITH OR WITHOUT PRESENCE OF ULCERS  *URINARY PROBLEMS  *BOWEL PROBLEMS  UNUSUAL RASH Items with * indicate a potential emergency and should be followed up as soon as possible.  Feel free to call the clinic you have any questions or concerns. The clinic phone number is [phone].    

## 2013-09-22 ENCOUNTER — Encounter: Payer: Self-pay | Admitting: Gastroenterology

## 2013-09-22 ENCOUNTER — Ambulatory Visit (INDEPENDENT_AMBULATORY_CARE_PROVIDER_SITE_OTHER): Payer: Medicare Other | Admitting: Gastroenterology

## 2013-09-22 VITALS — BP 136/60 | HR 76 | Ht 71.0 in | Wt 184.0 lb

## 2013-09-22 DIAGNOSIS — K2100 Gastro-esophageal reflux disease with esophagitis, without bleeding: Secondary | ICD-10-CM

## 2013-09-22 DIAGNOSIS — R1319 Other dysphagia: Secondary | ICD-10-CM

## 2013-09-22 MED ORDER — PANTOPRAZOLE SODIUM 40 MG PO TBEC: 40.0000 mg | DELAYED_RELEASE_TABLET | Freq: Two times a day (BID) | ORAL | Status: DC

## 2013-09-22 NOTE — Patient Instructions (Signed)
We have sent the following medications to your pharmacy for you to pick up at your convenience: Protonix.  Thank you for choosing me and [facility].  Pricilla Riffle. Dagoberto Ligas., MD., Marval Regal

## 2013-09-22 NOTE — Progress Notes (Signed)
    History of Present Illness: This is a 68 year old male with a history of GERD and esophagitis diagnosed at endoscopy in January 2014. He is undergoing treatment for multiple myeloma. His symptoms were under excellent control on pantoprazole twice daily. He discontinued pantoprazole and within 1 to 2 weeks he developed reflux symptoms and dysphagia. Since resuming pantoprazole his symptoms have resolved.  Current Medications, Allergies, Past Medical History, Past Surgical History, Family History and Social History were reviewed in [employer] record.  Physical Exam: General: Well developed , well nourished, no acute distress Head: Normocephalic and atraumatic Eyes:  sclerae anicteric, EOMI Ears: Normal auditory acuity Mouth: No deformity or lesions Lungs: Clear throughout to auscultation Heart: Regular rate and rhythm; no murmurs, rubs or bruits Abdomen: Soft, non tender and non distended. No masses, hepatosplenomegaly or hernias noted. Normal Bowel sounds Musculoskeletal: Symmetrical with no gross deformities  Pulses:  Normal pulses noted Extremities: No clubbing, cyanosis, edema or deformities noted Neurological: Alert oriented x 4, grossly nonfocal Psychological:  Alert and cooperative. Normal mood and affect  Assessment and Recommendations:  1. GERD and esophagitis. Continue standard antireflux measures and pantoprazole 40 mg twice a day. Followup in one year.  2. Colorectal cancer screening, average risk. He underwent colonoscopy by Dr. Ferdinand Lango in June 2013 with a hyperplastic polyp found however the prep was felt to be inadequate. Repeat screening colonoscopy is recommended in June 2016.

## 2013-09-27 ENCOUNTER — Ambulatory Visit (HOSPITAL_BASED_OUTPATIENT_CLINIC_OR_DEPARTMENT_OTHER): Payer: Medicare Other

## 2013-09-27 ENCOUNTER — Other Ambulatory Visit (HOSPITAL_BASED_OUTPATIENT_CLINIC_OR_DEPARTMENT_OTHER): Payer: Medicare Other

## 2013-09-27 VITALS — BP 148/81 | HR 70 | Temp 97.8°F | Resp 19

## 2013-09-27 DIAGNOSIS — C9002 Multiple myeloma in relapse: Secondary | ICD-10-CM

## 2013-09-27 DIAGNOSIS — C9000 Multiple myeloma not having achieved remission: Secondary | ICD-10-CM

## 2013-09-27 DIAGNOSIS — Z5111 Encounter for antineoplastic chemotherapy: Secondary | ICD-10-CM

## 2013-09-27 DIAGNOSIS — Z5112 Encounter for antineoplastic immunotherapy: Secondary | ICD-10-CM

## 2013-09-27 LAB — CBC WITH DIFFERENTIAL/PLATELET
BASO%: 0 % (ref 0.0–2.0)
Basophils Absolute: 0 10*3/uL (ref 0.0–0.1)
EOS ABS: 0 10*3/uL (ref 0.0–0.5)
EOS%: 1.2 % (ref 0.0–7.0)
HCT: 29 % — ABNORMAL LOW (ref 38.4–49.9)
HGB: 9.1 g/dL — ABNORMAL LOW (ref 13.0–17.1)
LYMPH%: 15.1 % (ref 14.0–49.0)
MCH: 24.6 pg — AB (ref 27.2–33.4)
MCHC: 31.4 g/dL — ABNORMAL LOW (ref 32.0–36.0)
MCV: 78.4 fL — ABNORMAL LOW (ref 79.3–98.0)
MONO#: 0.3 10*3/uL (ref 0.1–0.9)
MONO%: 7.6 % (ref 0.0–14.0)
NEUT%: 76.1 % — AB (ref 39.0–75.0)
NEUTROS ABS: 2.5 10*3/uL (ref 1.5–6.5)
PLATELETS: 86 10*3/uL — AB (ref 140–400)
RBC: 3.7 10*6/uL — ABNORMAL LOW (ref 4.20–5.82)
RDW: 15.2 % — ABNORMAL HIGH (ref 11.0–14.6)
WBC: 3.3 10*3/uL — ABNORMAL LOW (ref 4.0–10.3)
lymph#: 0.5 10*3/uL — ABNORMAL LOW (ref 0.9–3.3)
nRBC: 0 % (ref 0–0)

## 2013-09-27 LAB — COMPREHENSIVE METABOLIC PANEL (CC13)
ALT: 10 U/L (ref 0–55)
AST: 14 U/L (ref 5–34)
Albumin: 3.4 g/dL — ABNORMAL LOW (ref 3.5–5.0)
Alkaline Phosphatase: 41 U/L (ref 40–150)
Anion Gap: 10 mEq/L (ref 3–11)
BUN: 7.2 mg/dL (ref 7.0–26.0)
CO2: 24 meq/L (ref 22–29)
Calcium: 8.7 mg/dL (ref 8.4–10.4)
Chloride: 105 mEq/L (ref 98–109)
Creatinine: 1.1 mg/dL (ref 0.7–1.3)
GLUCOSE: 183 mg/dL — AB (ref 70–140)
Potassium: 3.7 mEq/L (ref 3.5–5.1)
Sodium: 138 mEq/L (ref 136–145)
TOTAL PROTEIN: 6.5 g/dL (ref 6.4–8.3)
Total Bilirubin: 1.29 mg/dL — ABNORMAL HIGH (ref 0.20–1.20)

## 2013-09-27 MED ORDER — SODIUM CHLORIDE 0.9 % IV SOLN
Freq: Once | INTRAVENOUS | Status: AC
  Administered 2013-09-27: 09:00:00 via INTRAVENOUS

## 2013-09-27 MED ORDER — CARFILZOMIB CHEMO INJECTION 60 MG
27.0000 mg/m2 | Freq: Once | INTRAVENOUS | Status: AC
  Administered 2013-09-27: 54 mg via INTRAVENOUS
  Filled 2013-09-27: qty 27

## 2013-09-27 MED ORDER — HEPARIN SOD (PORK) LOCK FLUSH 100 UNIT/ML IV SOLN
500.0000 [IU] | Freq: Once | INTRAVENOUS | Status: AC | PRN
  Administered 2013-09-27: 500 [IU]
  Filled 2013-09-27: qty 5

## 2013-09-27 MED ORDER — SODIUM CHLORIDE 0.9 % IV SOLN
300.0000 mg/m2 | Freq: Once | INTRAVENOUS | Status: AC
  Administered 2013-09-27: 600 mg via INTRAVENOUS
  Filled 2013-09-27: qty 30

## 2013-09-27 MED ORDER — ONDANSETRON 8 MG/50ML IVPB (CHCC)
8.0000 mg | Freq: Once | INTRAVENOUS | Status: AC
  Administered 2013-09-27: 8 mg via INTRAVENOUS

## 2013-09-27 MED ORDER — DEXAMETHASONE SODIUM PHOSPHATE 10 MG/ML IJ SOLN
INTRAMUSCULAR | Status: AC
  Filled 2013-09-27: qty 1

## 2013-09-27 MED ORDER — DEXAMETHASONE SODIUM PHOSPHATE 10 MG/ML IJ SOLN
10.0000 mg | Freq: Once | INTRAMUSCULAR | Status: AC
  Administered 2013-09-27: 10 mg via INTRAVENOUS

## 2013-09-27 MED ORDER — SODIUM CHLORIDE 0.9 % IJ SOLN
10.0000 mL | INTRAMUSCULAR | Status: DC | PRN
  Administered 2013-09-27: 10 mL
  Filled 2013-09-27: qty 10

## 2013-09-27 MED ORDER — ONDANSETRON 8 MG/NS 50 ML IVPB
INTRAVENOUS | Status: AC
  Filled 2013-09-27: qty 8

## 2013-09-27 NOTE — Patient Instructions (Signed)
[place] Discharge Instructions for Patients Receiving Chemotherapy  Today you received the following chemotherapy agents: Kyprolis, Cytoxan  To help prevent nausea and vomiting after your treatment, we encourage you to take your nausea medication as prescribed by your physician.    If you develop nausea and vomiting that is not controlled by your nausea medication, call the clinic.   BELOW ARE SYMPTOMS THAT SHOULD BE REPORTED IMMEDIATELY:  *FEVER GREATER THAN 100.5 F  *CHILLS WITH OR WITHOUT FEVER  NAUSEA AND VOMITING THAT IS NOT CONTROLLED WITH YOUR NAUSEA MEDICATION  *UNUSUAL SHORTNESS OF BREATH  *UNUSUAL BRUISING OR BLEEDING  TENDERNESS IN MOUTH AND THROAT WITH OR WITHOUT PRESENCE OF ULCERS  *URINARY PROBLEMS  *BOWEL PROBLEMS  UNUSUAL RASH Items with * indicate a potential emergency and should be followed up as soon as possible.  Feel free to call the clinic you have any questions or concerns. The clinic phone number is [phone].

## 2013-09-27 NOTE — Progress Notes (Signed)
Per Dr. Julien Nordmann, okay to tx with platelets-86.

## 2013-09-28 ENCOUNTER — Ambulatory Visit (HOSPITAL_BASED_OUTPATIENT_CLINIC_OR_DEPARTMENT_OTHER): Payer: Medicare Other

## 2013-09-28 VITALS — BP 141/70 | HR 66 | Temp 97.9°F | Resp 16

## 2013-09-28 DIAGNOSIS — Z5112 Encounter for antineoplastic immunotherapy: Secondary | ICD-10-CM

## 2013-09-28 DIAGNOSIS — C9000 Multiple myeloma not having achieved remission: Secondary | ICD-10-CM

## 2013-09-28 MED ORDER — ONDANSETRON 8 MG/50ML IVPB (CHCC)
8.0000 mg | Freq: Once | INTRAVENOUS | Status: AC
  Administered 2013-09-28: 8 mg via INTRAVENOUS

## 2013-09-28 MED ORDER — HEPARIN SOD (PORK) LOCK FLUSH 100 UNIT/ML IV SOLN
500.0000 [IU] | Freq: Once | INTRAVENOUS | Status: AC | PRN
  Administered 2013-09-28: 500 [IU]
  Filled 2013-09-28: qty 5

## 2013-09-28 MED ORDER — DEXAMETHASONE SODIUM PHOSPHATE 10 MG/ML IJ SOLN
10.0000 mg | Freq: Once | INTRAMUSCULAR | Status: AC
  Administered 2013-09-28: 10 mg via INTRAVENOUS

## 2013-09-28 MED ORDER — SODIUM CHLORIDE 0.9 % IJ SOLN
10.0000 mL | INTRAMUSCULAR | Status: DC | PRN
  Administered 2013-09-28: 10 mL
  Filled 2013-09-28: qty 10

## 2013-09-28 MED ORDER — SODIUM CHLORIDE 0.9 % IV SOLN
Freq: Once | INTRAVENOUS | Status: AC
  Administered 2013-09-28: 09:00:00 via INTRAVENOUS

## 2013-09-28 MED ORDER — SODIUM CHLORIDE 0.9 % IV SOLN
Freq: Once | INTRAVENOUS | Status: AC
  Administered 2013-09-28: 08:00:00 via INTRAVENOUS

## 2013-09-28 MED ORDER — DEXAMETHASONE SODIUM PHOSPHATE 10 MG/ML IJ SOLN
INTRAMUSCULAR | Status: AC
  Filled 2013-09-28: qty 1

## 2013-09-28 MED ORDER — ONDANSETRON 8 MG/NS 50 ML IVPB
INTRAVENOUS | Status: AC
  Filled 2013-09-28: qty 8

## 2013-09-28 MED ORDER — DEXTROSE 5 % IV SOLN
27.0000 mg/m2 | Freq: Once | INTRAVENOUS | Status: AC
  Administered 2013-09-28: 54 mg via INTRAVENOUS
  Filled 2013-09-28: qty 27

## 2013-09-28 NOTE — Patient Instructions (Signed)
[place] Discharge Instructions for Patients Receiving Chemotherapy  Today you received the following chemotherapy agents Kyprolis  To help prevent nausea and vomiting after your treatment, we encourage you to take your nausea medication as needed   If you develop nausea and vomiting that is not controlled by your nausea medication, call the clinic.   BELOW ARE SYMPTOMS THAT SHOULD BE REPORTED IMMEDIATELY:  *FEVER GREATER THAN 100.5 F  *CHILLS WITH OR WITHOUT FEVER  NAUSEA AND VOMITING THAT IS NOT CONTROLLED WITH YOUR NAUSEA MEDICATION  *UNUSUAL SHORTNESS OF BREATH  *UNUSUAL BRUISING OR BLEEDING  TENDERNESS IN MOUTH AND THROAT WITH OR WITHOUT PRESENCE OF ULCERS  *URINARY PROBLEMS  *BOWEL PROBLEMS  UNUSUAL RASH Items with * indicate a potential emergency and should be followed up as soon as possible.  Feel free to call the clinic you have any questions or concerns. The clinic phone number is [phone].

## 2013-10-03 ENCOUNTER — Encounter: Payer: Self-pay | Admitting: *Deleted

## 2013-10-03 NOTE — Progress Notes (Signed)
[facility] Healthcare Advance Directives Clinical Social Work  Clinical Social Work met with patient during Multiple Myeloma support group to review and complete healthcare advance directives. Patient completed healthcare power of attorney and healthcare living will.    Clinical Social Worker notarized documents and made copies for patient/family. Clinical Social Worker will send documents to medical records to be scanned into patient's chart. Clinical Social Worker encouraged patient/family to contact with any additional questions or concerns.  Lauren Mullis, MSW, LCSW Clinical Social Worker [facility] [phone]      

## 2013-10-04 ENCOUNTER — Other Ambulatory Visit (HOSPITAL_BASED_OUTPATIENT_CLINIC_OR_DEPARTMENT_OTHER): Payer: Medicare Other

## 2013-10-04 ENCOUNTER — Ambulatory Visit (HOSPITAL_BASED_OUTPATIENT_CLINIC_OR_DEPARTMENT_OTHER): Payer: Medicare Other

## 2013-10-04 VITALS — BP 169/85 | HR 61 | Temp 97.7°F | Resp 16

## 2013-10-04 DIAGNOSIS — C9002 Multiple myeloma in relapse: Secondary | ICD-10-CM

## 2013-10-04 DIAGNOSIS — Z5111 Encounter for antineoplastic chemotherapy: Secondary | ICD-10-CM

## 2013-10-04 DIAGNOSIS — C9000 Multiple myeloma not having achieved remission: Secondary | ICD-10-CM

## 2013-10-04 DIAGNOSIS — Z5112 Encounter for antineoplastic immunotherapy: Secondary | ICD-10-CM

## 2013-10-04 LAB — COMPREHENSIVE METABOLIC PANEL (CC13)
ALT: 9 U/L (ref 0–55)
AST: 14 U/L (ref 5–34)
Albumin: 3.2 g/dL — ABNORMAL LOW (ref 3.5–5.0)
Alkaline Phosphatase: 39 U/L — ABNORMAL LOW (ref 40–150)
Anion Gap: 8 mEq/L (ref 3–11)
BILIRUBIN TOTAL: 1.09 mg/dL (ref 0.20–1.20)
BUN: 8 mg/dL (ref 7.0–26.0)
CHLORIDE: 104 meq/L (ref 98–109)
CO2: 25 mEq/L (ref 22–29)
CREATININE: 1 mg/dL (ref 0.7–1.3)
Calcium: 8.6 mg/dL (ref 8.4–10.4)
Glucose: 204 mg/dl — ABNORMAL HIGH (ref 70–140)
Potassium: 4.1 mEq/L (ref 3.5–5.1)
SODIUM: 137 meq/L (ref 136–145)
Total Protein: 6.3 g/dL — ABNORMAL LOW (ref 6.4–8.3)

## 2013-10-04 LAB — CBC WITH DIFFERENTIAL/PLATELET
BASO%: 0.4 % (ref 0.0–2.0)
Basophils Absolute: 0 10*3/uL (ref 0.0–0.1)
EOS%: 1.2 % (ref 0.0–7.0)
Eosinophils Absolute: 0 10*3/uL (ref 0.0–0.5)
HCT: 28.8 % — ABNORMAL LOW (ref 38.4–49.9)
HGB: 8.9 g/dL — ABNORMAL LOW (ref 13.0–17.1)
LYMPH#: 0.5 10*3/uL — AB (ref 0.9–3.3)
LYMPH%: 12.9 % — AB (ref 14.0–49.0)
MCH: 24.3 pg — AB (ref 27.2–33.4)
MCHC: 30.7 g/dL — AB (ref 32.0–36.0)
MCV: 79.1 fL — ABNORMAL LOW (ref 79.3–98.0)
MONO#: 0.3 10*3/uL (ref 0.1–0.9)
MONO%: 9.1 % (ref 0.0–14.0)
NEUT#: 2.7 10*3/uL (ref 1.5–6.5)
NEUT%: 76.4 % — ABNORMAL HIGH (ref 39.0–75.0)
Platelets: 118 10*3/uL — ABNORMAL LOW (ref 140–400)
RBC: 3.64 10*6/uL — AB (ref 4.20–5.82)
RDW: 15.5 % — ABNORMAL HIGH (ref 11.0–14.6)
WBC: 3.5 10*3/uL — AB (ref 4.0–10.3)

## 2013-10-04 MED ORDER — SODIUM CHLORIDE 0.9 % IJ SOLN
10.0000 mL | INTRAMUSCULAR | Status: DC | PRN
  Administered 2013-10-04: 10 mL
  Filled 2013-10-04: qty 10

## 2013-10-04 MED ORDER — DEXTROSE 5 % IV SOLN
27.0000 mg/m2 | Freq: Once | INTRAVENOUS | Status: AC
  Administered 2013-10-04: 54 mg via INTRAVENOUS
  Filled 2013-10-04: qty 27

## 2013-10-04 MED ORDER — ONDANSETRON 8 MG/50ML IVPB (CHCC)
8.0000 mg | Freq: Once | INTRAVENOUS | Status: AC
  Administered 2013-10-04: 8 mg via INTRAVENOUS

## 2013-10-04 MED ORDER — ONDANSETRON 8 MG/NS 50 ML IVPB
INTRAVENOUS | Status: AC
  Filled 2013-10-04: qty 8

## 2013-10-04 MED ORDER — CYCLOPHOSPHAMIDE CHEMO INJECTION 1 GM
300.0000 mg/m2 | Freq: Once | INTRAMUSCULAR | Status: AC
  Administered 2013-10-04: 600 mg via INTRAVENOUS
  Filled 2013-10-04: qty 30

## 2013-10-04 MED ORDER — HEPARIN SOD (PORK) LOCK FLUSH 100 UNIT/ML IV SOLN
500.0000 [IU] | Freq: Once | INTRAVENOUS | Status: AC | PRN
  Administered 2013-10-04: 500 [IU]
  Filled 2013-10-04: qty 5

## 2013-10-04 MED ORDER — DEXAMETHASONE SODIUM PHOSPHATE 10 MG/ML IJ SOLN
INTRAMUSCULAR | Status: AC
  Filled 2013-10-04: qty 1

## 2013-10-04 MED ORDER — SODIUM CHLORIDE 0.9 % IV SOLN
Freq: Once | INTRAVENOUS | Status: AC
  Administered 2013-10-04: 10:00:00 via INTRAVENOUS

## 2013-10-04 MED ORDER — DEXAMETHASONE SODIUM PHOSPHATE 10 MG/ML IJ SOLN
10.0000 mg | Freq: Once | INTRAMUSCULAR | Status: AC
  Administered 2013-10-04: 10 mg via INTRAVENOUS

## 2013-10-04 NOTE — Patient Instructions (Signed)
[facility] Discharge Instructions for Patients Receiving Chemotherapy  Today you received the following chemotherapy agents:  Kyprolis/Cytoxan  To help prevent nausea and vomiting after your treatment, we encourage you to take your nausea medication. If you develop nausea and vomiting that is not controlled by your nausea medication, call the clinic.   BELOW ARE SYMPTOMS THAT SHOULD BE REPORTED IMMEDIATELY:  *FEVER GREATER THAN 100.5 F  *CHILLS WITH OR WITHOUT FEVER  NAUSEA AND VOMITING THAT IS NOT CONTROLLED WITH YOUR NAUSEA MEDICATION  *UNUSUAL SHORTNESS OF BREATH  *UNUSUAL BRUISING OR BLEEDING  TENDERNESS IN MOUTH AND THROAT WITH OR WITHOUT PRESENCE OF ULCERS  *URINARY PROBLEMS  *BOWEL PROBLEMS  UNUSUAL RASH Items with * indicate a potential emergency and should be followed up as soon as possible.  Feel free to call the clinic you have any questions or concerns. The clinic phone number is [phone].

## 2013-10-05 ENCOUNTER — Ambulatory Visit (HOSPITAL_BASED_OUTPATIENT_CLINIC_OR_DEPARTMENT_OTHER): Payer: Medicare Other

## 2013-10-05 VITALS — BP 145/80 | HR 72 | Temp 97.9°F | Resp 18

## 2013-10-05 DIAGNOSIS — C9000 Multiple myeloma not having achieved remission: Secondary | ICD-10-CM

## 2013-10-05 DIAGNOSIS — Z5112 Encounter for antineoplastic immunotherapy: Secondary | ICD-10-CM

## 2013-10-05 MED ORDER — DEXAMETHASONE SODIUM PHOSPHATE 10 MG/ML IJ SOLN
INTRAMUSCULAR | Status: AC
  Filled 2013-10-05: qty 1

## 2013-10-05 MED ORDER — DEXTROSE 5 % IV SOLN
27.0000 mg/m2 | Freq: Once | INTRAVENOUS | Status: AC
  Administered 2013-10-05: 54 mg via INTRAVENOUS
  Filled 2013-10-05: qty 27

## 2013-10-05 MED ORDER — SODIUM CHLORIDE 0.9 % IV SOLN
Freq: Once | INTRAVENOUS | Status: AC
  Administered 2013-10-05: 08:00:00 via INTRAVENOUS

## 2013-10-05 MED ORDER — DEXAMETHASONE SODIUM PHOSPHATE 10 MG/ML IJ SOLN
10.0000 mg | Freq: Once | INTRAMUSCULAR | Status: AC
  Administered 2013-10-05: 10 mg via INTRAVENOUS

## 2013-10-05 MED ORDER — HEPARIN SOD (PORK) LOCK FLUSH 100 UNIT/ML IV SOLN
500.0000 [IU] | Freq: Once | INTRAVENOUS | Status: AC | PRN
  Administered 2013-10-05: 500 [IU]
  Filled 2013-10-05: qty 5

## 2013-10-05 MED ORDER — ONDANSETRON 8 MG/50ML IVPB (CHCC)
8.0000 mg | Freq: Once | INTRAVENOUS | Status: AC
  Administered 2013-10-05: 8 mg via INTRAVENOUS

## 2013-10-05 MED ORDER — ONDANSETRON 8 MG/NS 50 ML IVPB
INTRAVENOUS | Status: AC
  Filled 2013-10-05: qty 8

## 2013-10-05 MED ORDER — SODIUM CHLORIDE 0.9 % IJ SOLN
10.0000 mL | INTRAMUSCULAR | Status: DC | PRN
  Administered 2013-10-05: 10 mL
  Filled 2013-10-05: qty 10

## 2013-10-05 NOTE — Patient Instructions (Signed)
[facility] Discharge Instructions for Patients Receiving Chemotherapy  Today you received the following chemotherapy agents: Kyprolis  To help prevent nausea and vomiting after your treatment, we encourage you to take your nausea medication as prescribed by your physician.   If you develop nausea and vomiting that is not controlled by your nausea medication, call the clinic.   BELOW ARE SYMPTOMS THAT SHOULD BE REPORTED IMMEDIATELY:  *FEVER GREATER THAN 100.5 F  *CHILLS WITH OR WITHOUT FEVER  NAUSEA AND VOMITING THAT IS NOT CONTROLLED WITH YOUR NAUSEA MEDICATION  *UNUSUAL SHORTNESS OF BREATH  *UNUSUAL BRUISING OR BLEEDING  TENDERNESS IN MOUTH AND THROAT WITH OR WITHOUT PRESENCE OF ULCERS  *URINARY PROBLEMS  *BOWEL PROBLEMS  UNUSUAL RASH Items with * indicate a potential emergency and should be followed up as soon as possible.  Feel free to call the clinic you have any questions or concerns. The clinic phone number is [phone].    

## 2013-10-11 ENCOUNTER — Other Ambulatory Visit (HOSPITAL_BASED_OUTPATIENT_CLINIC_OR_DEPARTMENT_OTHER): Payer: Medicare Other

## 2013-10-11 DIAGNOSIS — C9002 Multiple myeloma in relapse: Secondary | ICD-10-CM

## 2013-10-11 DIAGNOSIS — C9000 Multiple myeloma not having achieved remission: Secondary | ICD-10-CM

## 2013-10-11 LAB — COMPREHENSIVE METABOLIC PANEL (CC13)
ALBUMIN: 3.3 g/dL — AB (ref 3.5–5.0)
ALT: 6 U/L (ref 0–55)
AST: 14 U/L (ref 5–34)
Alkaline Phosphatase: 44 U/L (ref 40–150)
Anion Gap: 10 mEq/L (ref 3–11)
BUN: 9.7 mg/dL (ref 7.0–26.0)
CALCIUM: 9.4 mg/dL (ref 8.4–10.4)
CHLORIDE: 102 meq/L (ref 98–109)
CO2: 27 mEq/L (ref 22–29)
Creatinine: 1.1 mg/dL (ref 0.7–1.3)
Glucose: 200 mg/dl — ABNORMAL HIGH (ref 70–140)
POTASSIUM: 3.6 meq/L (ref 3.5–5.1)
SODIUM: 139 meq/L (ref 136–145)
TOTAL PROTEIN: 6.7 g/dL (ref 6.4–8.3)
Total Bilirubin: 1.32 mg/dL — ABNORMAL HIGH (ref 0.20–1.20)

## 2013-10-11 LAB — CBC WITH DIFFERENTIAL/PLATELET
BASO%: 0.7 % (ref 0.0–2.0)
BASOS ABS: 0 10*3/uL (ref 0.0–0.1)
EOS%: 0.7 % (ref 0.0–7.0)
Eosinophils Absolute: 0 10*3/uL (ref 0.0–0.5)
HCT: 28.9 % — ABNORMAL LOW (ref 38.4–49.9)
HGB: 9.1 g/dL — ABNORMAL LOW (ref 13.0–17.1)
LYMPH#: 0.5 10*3/uL — AB (ref 0.9–3.3)
LYMPH%: 17.9 % (ref 14.0–49.0)
MCH: 24.4 pg — AB (ref 27.2–33.4)
MCHC: 31.3 g/dL — AB (ref 32.0–36.0)
MCV: 77.8 fL — ABNORMAL LOW (ref 79.3–98.0)
MONO#: 0.3 10*3/uL (ref 0.1–0.9)
MONO%: 13 % (ref 0.0–14.0)
NEUT#: 1.8 10*3/uL (ref 1.5–6.5)
NEUT%: 67.7 % (ref 39.0–75.0)
Platelets: 163 10*3/uL (ref 140–400)
RBC: 3.72 10*6/uL — AB (ref 4.20–5.82)
RDW: 15.5 % — AB (ref 11.0–14.6)
WBC: 2.6 10*3/uL — ABNORMAL LOW (ref 4.0–10.3)

## 2013-10-18 ENCOUNTER — Encounter: Payer: Self-pay | Admitting: Nurse Practitioner

## 2013-10-18 ENCOUNTER — Ambulatory Visit: Payer: Medicare Other

## 2013-10-18 ENCOUNTER — Ambulatory Visit: Payer: Medicare Other | Admitting: Nurse Practitioner

## 2013-10-18 ENCOUNTER — Other Ambulatory Visit (HOSPITAL_BASED_OUTPATIENT_CLINIC_OR_DEPARTMENT_OTHER): Payer: Medicare Other

## 2013-10-18 ENCOUNTER — Telehealth: Payer: Self-pay | Admitting: Internal Medicine

## 2013-10-18 ENCOUNTER — Ambulatory Visit (HOSPITAL_BASED_OUTPATIENT_CLINIC_OR_DEPARTMENT_OTHER): Payer: Medicare Other | Admitting: Nurse Practitioner

## 2013-10-18 ENCOUNTER — Ambulatory Visit (HOSPITAL_BASED_OUTPATIENT_CLINIC_OR_DEPARTMENT_OTHER): Payer: Medicare Other

## 2013-10-18 ENCOUNTER — Other Ambulatory Visit: Payer: Medicare Other

## 2013-10-18 VITALS — BP 125/68 | HR 78 | Temp 98.5°F | Resp 18 | Ht 71.0 in | Wt 180.0 lb

## 2013-10-18 DIAGNOSIS — C9000 Multiple myeloma not having achieved remission: Secondary | ICD-10-CM

## 2013-10-18 DIAGNOSIS — C9002 Multiple myeloma in relapse: Secondary | ICD-10-CM

## 2013-10-18 DIAGNOSIS — Z5112 Encounter for antineoplastic immunotherapy: Secondary | ICD-10-CM

## 2013-10-18 DIAGNOSIS — J069 Acute upper respiratory infection, unspecified: Secondary | ICD-10-CM

## 2013-10-18 DIAGNOSIS — Z5111 Encounter for antineoplastic chemotherapy: Secondary | ICD-10-CM

## 2013-10-18 LAB — COMPREHENSIVE METABOLIC PANEL (CC13)
ALK PHOS: 45 U/L (ref 40–150)
ALT: 7 U/L (ref 0–55)
AST: 13 U/L (ref 5–34)
Albumin: 3.3 g/dL — ABNORMAL LOW (ref 3.5–5.0)
Anion Gap: 8 mEq/L (ref 3–11)
BUN: 10.1 mg/dL (ref 7.0–26.0)
CO2: 26 mEq/L (ref 22–29)
Calcium: 9.5 mg/dL (ref 8.4–10.4)
Chloride: 105 mEq/L (ref 98–109)
Creatinine: 1 mg/dL (ref 0.7–1.3)
Glucose: 181 mg/dl — ABNORMAL HIGH (ref 70–140)
Potassium: 4 mEq/L (ref 3.5–5.1)
SODIUM: 139 meq/L (ref 136–145)
Total Bilirubin: 1.01 mg/dL (ref 0.20–1.20)
Total Protein: 6.9 g/dL (ref 6.4–8.3)

## 2013-10-18 LAB — CBC WITH DIFFERENTIAL/PLATELET
BASO%: 0.3 % (ref 0.0–2.0)
BASOS ABS: 0 10*3/uL (ref 0.0–0.1)
EOS%: 1.3 % (ref 0.0–7.0)
Eosinophils Absolute: 0 10*3/uL (ref 0.0–0.5)
HCT: 29.9 % — ABNORMAL LOW (ref 38.4–49.9)
HGB: 9.4 g/dL — ABNORMAL LOW (ref 13.0–17.1)
LYMPH%: 18.9 % (ref 14.0–49.0)
MCH: 24.4 pg — AB (ref 27.2–33.4)
MCHC: 31.4 g/dL — ABNORMAL LOW (ref 32.0–36.0)
MCV: 77.5 fL — AB (ref 79.3–98.0)
MONO#: 0.3 10*3/uL (ref 0.1–0.9)
MONO%: 10.6 % (ref 0.0–14.0)
NEUT#: 2.1 10*3/uL (ref 1.5–6.5)
NEUT%: 68.9 % (ref 39.0–75.0)
Platelets: 185 10*3/uL (ref 140–400)
RBC: 3.86 10*6/uL — AB (ref 4.20–5.82)
RDW: 15.3 % — AB (ref 11.0–14.6)
WBC: 3 10*3/uL — ABNORMAL LOW (ref 4.0–10.3)
lymph#: 0.6 10*3/uL — ABNORMAL LOW (ref 0.9–3.3)

## 2013-10-18 MED ORDER — DEXAMETHASONE SODIUM PHOSPHATE 10 MG/ML IJ SOLN
INTRAMUSCULAR | Status: AC
  Filled 2013-10-18: qty 1

## 2013-10-18 MED ORDER — HEPARIN SOD (PORK) LOCK FLUSH 100 UNIT/ML IV SOLN
500.0000 [IU] | Freq: Once | INTRAVENOUS | Status: AC | PRN
  Administered 2013-10-18: 500 [IU]
  Filled 2013-10-18: qty 5

## 2013-10-18 MED ORDER — DEXAMETHASONE SODIUM PHOSPHATE 10 MG/ML IJ SOLN
10.0000 mg | Freq: Once | INTRAMUSCULAR | Status: AC
  Administered 2013-10-18: 10 mg via INTRAVENOUS

## 2013-10-18 MED ORDER — SODIUM CHLORIDE 0.9 % IV SOLN
300.0000 mg/m2 | Freq: Once | INTRAVENOUS | Status: AC
  Administered 2013-10-18: 600 mg via INTRAVENOUS
  Filled 2013-10-18: qty 30

## 2013-10-18 MED ORDER — ONDANSETRON 8 MG/NS 50 ML IVPB
INTRAVENOUS | Status: AC
  Filled 2013-10-18: qty 8

## 2013-10-18 MED ORDER — SODIUM CHLORIDE 0.9 % IJ SOLN
10.0000 mL | INTRAMUSCULAR | Status: DC | PRN
  Administered 2013-10-18: 10 mL
  Filled 2013-10-18: qty 10

## 2013-10-18 MED ORDER — CARFILZOMIB CHEMO INJECTION 60 MG
27.0000 mg/m2 | Freq: Once | INTRAVENOUS | Status: AC
  Administered 2013-10-18: 54 mg via INTRAVENOUS
  Filled 2013-10-18: qty 27

## 2013-10-18 MED ORDER — ONDANSETRON 8 MG/50ML IVPB (CHCC)
8.0000 mg | Freq: Once | INTRAVENOUS | Status: AC
  Administered 2013-10-18: 8 mg via INTRAVENOUS

## 2013-10-18 MED ORDER — SODIUM CHLORIDE 0.9 % IV SOLN
Freq: Once | INTRAVENOUS | Status: AC
  Administered 2013-10-18: 12:00:00 via INTRAVENOUS

## 2013-10-18 NOTE — Assessment & Plan Note (Addendum)
Patient appears to be tolerating his chemotherapy fairly well.  Reviewed all of patient's lab results with him today.  His hemoglobin ranged stable at 9.4 today.  Patient will proceed today with cycle 14 of his carfilzomib/cyclophosphamide chemotherapy regimen.  His chemotherapy regimen is scheduled on days 1 and 2, days 8 and 9, and days 15 and 16 of an every 28 day cycle.  Patient will continue with weekly labs.  Patient will also need to be scheduled for labs/followup visit prior to initiating his next cycle of chemotherapy on 11/15/2013.  We will plan to draw repeat myeloma markers at the completion of cycle 16 of his chemotherapy.  Also, patient continues to receive Zometa on an every two-month basis.  He last received a Zometa on 09/26/2013.  He'll be due his next Zometa infusion on 11/26/2013.

## 2013-10-18 NOTE — Progress Notes (Signed)
[place]   Chief Complaint  Patient presents with  . Follow-up    HPI: Eddie Thomas 68 y.o. male diagnosed with multiple myeloma.  Currently undergoing core feels mouth/cyclophosphamide/dexamethasone chemotherapy regimen.  Also receiving Zometa therapy as well.  Patient presents today for followup prior to initiating cycle 14 of his chemotherapy regimen.  He states he's been doing fairly well recently.  He denies any specific issues with either fatigue or appetite.  He denies any recent fever or chills.  He does state he has had some mild URI symptoms of for the past few days; specifically with some mild nasal congestion and some sinus drainage.  He denies any nausea, vomiting, or diarrhea.   HPI  CURRENT THERAPY: Upcoming Treatment Dates - MYELOMA SALVAGE Cyclophosphamide / Carfilzomib / Dexamethasone (CCd) q28d Days with orders from any treatment category:  10/19/2013      SCHEDULING COMMUNICATION      ondansetron (ZOFRAN) IVPB 8 mg      dexamethasone (DECADRON) injection 10 mg      carfilzomib (KYPROLIS) 54 mg in dextrose 5 % 50 mL chemo infusion      sodium chloride 0.9 % injection 10 mL      heparin lock flush 100 unit/mL      heparin lock flush 100 unit/mL      alteplase (CATHFLO ACTIVASE) injection 2 mg      sodium chloride 0.9 % injection 3 mL      diphenhydrAMINE (BENADRYL) injection 25 mg      famotidine (PEPCID) IVPB 20 mg      0.9 %  sodium chloride infusion      methylPREDNISolone sodium succinate (SOLU-MEDROL) 125 mg/2 mL injection 125 mg      EPINEPHrine (ADRENALIN) 0.1 MG/ML injection 0.25 mg      EPINEPHrine (ADRENALIN) 0.1 MG/ML injection 0.25 mg      EPINEPHrine (ADRENALIN) injection 0.5 mg      EPINEPHrine (ADRENALIN) injection 0.5 mg      diphenhydrAMINE (BENADRYL) injection 50 mg      albuterol (PROVENTIL) (2.5 MG/3ML) 0.083% nebulizer solution 2.5 mg      0.9 %  sodium chloride infusion      0.9 %  sodium chloride  infusion 10/25/2013      SCHEDULING COMMUNICATION      ondansetron (ZOFRAN) IVPB 8 mg      dexamethasone (DECADRON) injection 10 mg      cyclophosphamide (CYTOXAN) 600 mg in sodium chloride 0.9 % 250 mL chemo infusion      carfilzomib (KYPROLIS) 54 mg in dextrose 5 % 50 mL chemo infusion      sodium chloride 0.9 % injection 10 mL      heparin lock flush 100 unit/mL      heparin lock flush 100 unit/mL      alteplase (CATHFLO ACTIVASE) injection 2 mg      sodium chloride 0.9 % injection 3 mL      diphenhydrAMINE (BENADRYL) injection 25 mg      famotidine (PEPCID) IVPB 20 mg      0.9 %  sodium chloride infusion      methylPREDNISolone sodium succinate (SOLU-MEDROL) 125 mg/2 mL injection 125 mg      EPINEPHrine (ADRENALIN) 0.1 MG/ML injection 0.25 mg      EPINEPHrine (ADRENALIN) 0.1 MG/ML injection 0.25 mg      EPINEPHrine (ADRENALIN) injection 0.5 mg      EPINEPHrine (ADRENALIN) injection 0.5 mg  diphenhydrAMINE (BENADRYL) injection 50 mg      albuterol (PROVENTIL) (2.5 MG/3ML) 0.083% nebulizer solution 2.5 mg      0.9 %  sodium chloride infusion      0.9 %  sodium chloride infusion      TREATMENT CONDITIONS      [facility]URSING COMMUNICATION 10/26/2013      SCHEDULING COMMUNICATION      ondansetron (ZOFRAN) IVPB 8 mg      dexamethasone (DECADRON) injection 10 mg      carfilzomib (KYPROLIS) 54 mg in dextrose 5 % 50 mL chemo infusion      sodium chloride 0.9 % injection 10 mL      heparin lock flush 100 unit/mL      heparin lock flush 100 unit/mL      alteplase (CATHFLO ACTIVASE) injection 2 mg      sodium chloride 0.9 % injection 3 mL      diphenhydrAMINE (BENADRYL) injection 25 mg      famotidine (PEPCID) IVPB 20 mg      0.9 %  sodium chloride infusion      methylPREDNISolone sodium succinate (SOLU-MEDROL) 125 mg/2 mL injection 125 mg      EPINEPHrine (ADRENALIN) 0.1 MG/ML injection 0.25 mg      EPINEPHrine (ADRENALIN) 0.1 MG/ML injection 0.25 mg      EPINEPHrine (ADRENALIN)  injection 0.5 mg      EPINEPHrine (ADRENALIN) injection 0.5 mg      diphenhydrAMINE (BENADRYL) injection 50 mg      albuterol (PROVENTIL) (2.5 MG/3ML) 0.083% nebulizer solution 2.5 mg      0.9 %  sodium chloride infusion      0.9 %  sodium chloride infusion    ROS  Past Medical History  Diagnosis Date  . Multiple myeloma   . Diabetes mellitus 07/03/2011  . Hypertension 07/03/2011  . Hyperlipidemia 07/03/2011  . Colon polyps 2012  . Gastric ulcer     Past Surgical History  Procedure Laterality Date  . Limbal stem cell transplant    . Humerus fracture surgery      right  . 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    has Multiple myeloma; Diabetes mellitus; Hypertension; Hyperlipidemia; Dysphagia, pharyngoesophageal; and URI (upper respiratory infection) on his problem list.     has No Known Allergies.    Medication List       This list is accurate as of: 10/18/13  6:30 PM.  Always use your most recent med list.               lidocaine-prilocaine cream  Commonly known as:  EMLA  APPLY CREAM TOPICALLY TO AFFECTED AREA AS DIRECTED     lisinopril 10 MG tablet  Commonly known as:  PRINIVIL,ZESTRIL  Take 5 mg by mouth every evening.     oxyCODONE-acetaminophen 5-325 MG per tablet  Commonly known as:  PERCOCET/ROXICET  Take 1 tablet by mouth every 6 (six) hours as needed for severe pain.     pantoprazole 40 MG tablet  Commonly known as:  PROTONIX  Take 1 tablet (40 mg total) by mouth 2 (two) times daily.     pioglitazone 15 MG tablet  Commonly known as:  ACTOS  Take 15 mg by mouth daily.     prochlorperazine 10 MG tablet  Commonly known as:  COMPAZINE  Take 10 mg by mouth every 6 (six) hours as needed for nausea or vomiting.     simvastatin 10  MG tablet  Commonly known as:  ZOCOR  Take 10 mg by mouth at bedtime.     temazepam 15 MG capsule  Commonly known as:  RESTORIL  Take 15 mg by mouth at bedtime as needed for sleep.     valACYclovir  500 MG tablet  Commonly known as:  VALTREX  TAKE ONE TABLET BY MOUTH ONCE DAILY     VIAGRA 50 MG tablet  Generic drug:  sildenafil  Take 50 mg by mouth daily as needed for erectile dysfunction.     Vitamin D-3 5000 UNITS Tabs  Take 1 tablet by mouth daily.         PHYSICAL EXAMINATION  Blood pressure 125/68, pulse 78, temperature 98.5 F (36.9 C), temperature source Oral, resp. rate 18, height 5' 11" (1.803 m), weight 180 lb (81.647 kg), SpO2 100.00%.  Physical Exam  Nursing note and vitals reviewed. Constitutional: He is oriented to person, place, and time and well-developed, well-nourished, and in no distress. No distress.  HENT:  Head: Normocephalic and atraumatic.  Right Ear: External ear normal.  Left Ear: External ear normal.  Mouth/Throat: Oropharynx is clear and moist. No oropharyngeal exudate.  Eyes: Conjunctivae and EOM are normal. Pupils are equal, round, and reactive to light. No scleral icterus.  Neck: Normal range of motion. Neck supple. No JVD present. No tracheal deviation present. No thyromegaly present.  Cardiovascular: Normal rate, regular rhythm, normal heart sounds and intact distal pulses.  Exam reveals no friction rub.   No murmur heard. Pulmonary/Chest: Effort normal and breath sounds normal. No respiratory distress.  Abdominal: Soft. Bowel sounds are normal. He exhibits no distension. There is no tenderness. There is no rebound.  Musculoskeletal: Normal range of motion. He exhibits no edema and no tenderness.  Lymphadenopathy:    He has no cervical adenopathy.  Neurological: He is alert and oriented to person, place, and time. Gait normal.  Skin: Skin is warm and dry. No rash noted. No erythema.  Psychiatric: Affect normal.    LABORATORY DATA:. CBC  Lab Results  Component Value Date   WBC 3.0* 10/18/2013   RBC 3.86* 10/18/2013   HGB 9.4* 10/18/2013   HCT 29.9* 10/18/2013   PLT 185 10/18/2013   MCV 77.5* 10/18/2013   MCH 24.4* 10/18/2013   MCHC  31.4* 10/18/2013   RDW 15.3* 10/18/2013   LYMPHSABS 0.6* 10/18/2013   MONOABS 0.3 10/18/2013   EOSABS 0.0 10/18/2013   BASOSABS 0.0 10/18/2013     CMET  Lab Results  Component Value Date   NA 139 10/18/2013   K 4.0 10/18/2013   CL 101 08/30/2013   CO2 26 10/18/2013   GLUCOSE 181* 10/18/2013   BUN 10.1 10/18/2013   CREATININE 1.0 10/18/2013   CALCIUM 9.5 10/18/2013   PROT 6.9 10/18/2013   ALBUMIN 3.3* 10/18/2013   AST 13 10/18/2013   ALT 7 10/18/2013   ALKPHOS 45 10/18/2013   BILITOT 1.01 10/18/2013   GFRNONAA 56* 06/03/2013   GFRAA 65* 06/03/2013   ASSESSMENT/PLAN:    Multiple myeloma  Assessment & Plan Patient appears to be tolerating his chemotherapy fairly well.  Reviewed all of patient's lab results with him today.  His hemoglobin ranged stable at 9.4 today.  Patient will proceed today with cycle 14 of his car feels mouth/cyclophosphamide chemotherapy regimen.  His chemotherapy regimen is scheduled on days 1 and 2, days 8 and 9, and days 15 and 16 Ativan every 28 day cycle.  Patient will continue with weekly labs.  Patient will also need to be scheduled for labs/followup visit prior to initiating his next cycle of chemotherapy on 11/15/2013.  We will plan to draw repeat myeloma markers at the completion of cycle 16 of his chemotherapy.  Also, patient continues to receive Zometa on an every two-month basis.  He last received a Zometa on 09/26/2013.  He'll be due his next Zometa infusion on 11/26/2013.   URI (upper respiratory infection)  Assessment & Plan Patient does have some very mild nasal congestion and sinus drainage of; but is afebrile on exam today.  Advised patient to try some over-the-counter cold medicine on an as-needed basis.   Patient stated understanding of all instructions; and was in agreement with this plan of care. The patient knows to call the clinic with any problems, questions or concerns.   This was a shared visit with Dr. Julien Nordmann today.  Total time spent with  patient was 25 minutes;  with greater than 75 percent of that time spent in face to face counseling regarding his symptoms, and coordination of care and follow up.  Disclaimer: This note was dictated with voice recognition software. Similar sounding words can inadvertently be transcribed and may not be corrected upon review.   Drue Second, NP 10/18/2013   ADDENDUM: Hematology/Oncology Attending: I had a face to face encounter with the patient. I recommended his care plan. This is a very pleasant 68 years old [place] American male with relapsed multiple myeloma currently undergoing systemic chemotherapy with Carfilzomib, Cytoxan and dexamethasone status post 13 cycles. He is tolerating his treatment fairly well with no significant adverse effects. The patient was seen recently at [facility] and repeat myeloma panel showed no evidence for disease progression compared to his blood work here and [facility]. I discussed the lab result with the patient today. I recommended for him to continue his current treatment with the same regimen. He would come back for followup visit in one month for evaluation before starting cycle #15. He was advised to call immediately if he has any concerning symptoms in the interval.  Disclaimer: This note was dictated with voice recognition software. Similar sounding words can inadvertently be transcribed and may be missed upon review. Eilleen Kempf., MD 10/19/2013

## 2013-10-18 NOTE — Telephone Encounter (Signed)
[facility] APPT SCHEDULE FOR SEPT THRU NOV.

## 2013-10-18 NOTE — Assessment & Plan Note (Signed)
Patient does have some very mild nasal congestion and sinus drainage of; but is afebrile on exam today.  Advised patient to try some over-the-counter cold medicine on an as-needed basis.

## 2013-10-18 NOTE — Patient Instructions (Signed)
[facility] Discharge Instructions for Patients Receiving Chemotherapy  Today you received the following chemotherapy agents  Cytoxan, Kyprolis  To help prevent nausea and vomiting after your treatment, we encourage you to take your nausea medication  Zofran   If you develop nausea and vomiting that is not controlled by your nausea medication, call the clinic.   BELOW ARE SYMPTOMS THAT SHOULD BE REPORTED IMMEDIATELY:  *FEVER GREATER THAN 100.5 F  *CHILLS WITH OR WITHOUT FEVER  NAUSEA AND VOMITING THAT IS NOT CONTROLLED WITH YOUR NAUSEA MEDICATION  *UNUSUAL SHORTNESS OF BREATH  *UNUSUAL BRUISING OR BLEEDING  TENDERNESS IN MOUTH AND THROAT WITH OR WITHOUT PRESENCE OF ULCERS  *URINARY PROBLEMS  *BOWEL PROBLEMS  UNUSUAL RASH Items with * indicate a potential emergency and should be followed up as soon as possible.  Feel free to call the clinic you have any questions or concerns. The clinic phone number is [phone].

## 2013-10-19 ENCOUNTER — Ambulatory Visit (HOSPITAL_BASED_OUTPATIENT_CLINIC_OR_DEPARTMENT_OTHER): Payer: Medicare Other

## 2013-10-19 VITALS — BP 118/70 | HR 72 | Temp 98.8°F | Resp 16

## 2013-10-19 DIAGNOSIS — Z5112 Encounter for antineoplastic immunotherapy: Secondary | ICD-10-CM

## 2013-10-19 DIAGNOSIS — C9000 Multiple myeloma not having achieved remission: Secondary | ICD-10-CM

## 2013-10-19 MED ORDER — SODIUM CHLORIDE 0.9 % IV SOLN
Freq: Once | INTRAVENOUS | Status: AC
  Administered 2013-10-19: 09:00:00 via INTRAVENOUS

## 2013-10-19 MED ORDER — SODIUM CHLORIDE 0.9 % IJ SOLN
10.0000 mL | INTRAMUSCULAR | Status: DC | PRN
  Administered 2013-10-19: 10 mL
  Filled 2013-10-19: qty 10

## 2013-10-19 MED ORDER — DEXAMETHASONE SODIUM PHOSPHATE 10 MG/ML IJ SOLN
10.0000 mg | Freq: Once | INTRAMUSCULAR | Status: AC
  Administered 2013-10-19: 10 mg via INTRAVENOUS

## 2013-10-19 MED ORDER — ONDANSETRON 8 MG/50ML IVPB (CHCC)
8.0000 mg | Freq: Once | INTRAVENOUS | Status: AC
  Administered 2013-10-19: 8 mg via INTRAVENOUS

## 2013-10-19 MED ORDER — DEXTROSE 5 % IV SOLN
27.0000 mg/m2 | Freq: Once | INTRAVENOUS | Status: AC
  Administered 2013-10-19: 54 mg via INTRAVENOUS
  Filled 2013-10-19: qty 27

## 2013-10-19 MED ORDER — HEPARIN SOD (PORK) LOCK FLUSH 100 UNIT/ML IV SOLN
500.0000 [IU] | Freq: Once | INTRAVENOUS | Status: AC | PRN
  Administered 2013-10-19: 500 [IU]
  Filled 2013-10-19: qty 5

## 2013-10-19 MED ORDER — DEXAMETHASONE SODIUM PHOSPHATE 10 MG/ML IJ SOLN
INTRAMUSCULAR | Status: AC
  Filled 2013-10-19: qty 1

## 2013-10-19 MED ORDER — ONDANSETRON 8 MG/NS 50 ML IVPB
INTRAVENOUS | Status: AC
Start: 2013-10-19 — End: 2013-10-19
  Filled 2013-10-19: qty 8

## 2013-10-19 NOTE — Patient Instructions (Addendum)
[facility] Discharge Instructions for Patients Receiving Chemotherapy  Today you received the following chemotherapy agents Kyprolis To help prevent nausea and vomiting after your treatment, we encourage you to take your nausea medication as prescribed.  If you develop nausea and vomiting that is not controlled by your nausea medication, call the clinic.   BELOW ARE SYMPTOMS THAT SHOULD BE REPORTED IMMEDIATELY:  *FEVER GREATER THAN 100.5 F  *CHILLS WITH OR WITHOUT FEVER  NAUSEA AND VOMITING THAT IS NOT CONTROLLED WITH YOUR NAUSEA MEDICATION  *UNUSUAL SHORTNESS OF BREATH  *UNUSUAL BRUISING OR BLEEDING  TENDERNESS IN MOUTH AND THROAT WITH OR WITHOUT PRESENCE OF ULCERS  *URINARY PROBLEMS  *BOWEL PROBLEMS  UNUSUAL RASH Items with * indicate a potential emergency and should be followed up as soon as possible.  Feel free to call the clinic you have any questions or concerns. The clinic phone number is [phone].    

## 2013-10-25 ENCOUNTER — Other Ambulatory Visit (HOSPITAL_BASED_OUTPATIENT_CLINIC_OR_DEPARTMENT_OTHER): Payer: Medicare Other

## 2013-10-25 ENCOUNTER — Ambulatory Visit (HOSPITAL_BASED_OUTPATIENT_CLINIC_OR_DEPARTMENT_OTHER): Payer: Medicare Other

## 2013-10-25 VITALS — BP 134/77 | HR 69 | Temp 98.2°F | Resp 12

## 2013-10-25 DIAGNOSIS — C9000 Multiple myeloma not having achieved remission: Secondary | ICD-10-CM

## 2013-10-25 DIAGNOSIS — Z5112 Encounter for antineoplastic immunotherapy: Secondary | ICD-10-CM

## 2013-10-25 DIAGNOSIS — Z5111 Encounter for antineoplastic chemotherapy: Secondary | ICD-10-CM

## 2013-10-25 LAB — CBC WITH DIFFERENTIAL/PLATELET
BASO%: 0.4 % (ref 0.0–2.0)
Basophils Absolute: 0 10*3/uL (ref 0.0–0.1)
EOS%: 0.7 % (ref 0.0–7.0)
Eosinophils Absolute: 0 10*3/uL (ref 0.0–0.5)
HEMATOCRIT: 28.3 % — AB (ref 38.4–49.9)
HGB: 8.8 g/dL — ABNORMAL LOW (ref 13.0–17.1)
LYMPH#: 0.4 10*3/uL — AB (ref 0.9–3.3)
LYMPH%: 14.4 % (ref 14.0–49.0)
MCH: 24.3 pg — AB (ref 27.2–33.4)
MCHC: 31.1 g/dL — AB (ref 32.0–36.0)
MCV: 78.1 fL — ABNORMAL LOW (ref 79.3–98.0)
MONO#: 0.3 10*3/uL (ref 0.1–0.9)
MONO%: 9.8 % (ref 0.0–14.0)
NEUT#: 2.3 10*3/uL (ref 1.5–6.5)
NEUT%: 74.7 % (ref 39.0–75.0)
Platelets: 120 10*3/uL — ABNORMAL LOW (ref 140–400)
RBC: 3.63 10*6/uL — AB (ref 4.20–5.82)
RDW: 14.8 % — AB (ref 11.0–14.6)
WBC: 3.1 10*3/uL — AB (ref 4.0–10.3)

## 2013-10-25 LAB — COMPREHENSIVE METABOLIC PANEL (CC13)
ALBUMIN: 3.2 g/dL — AB (ref 3.5–5.0)
ALT: 9 U/L (ref 0–55)
ANION GAP: 7 meq/L (ref 3–11)
AST: 11 U/L (ref 5–34)
Alkaline Phosphatase: 40 U/L (ref 40–150)
BUN: 8.7 mg/dL (ref 7.0–26.0)
CALCIUM: 9.2 mg/dL (ref 8.4–10.4)
CHLORIDE: 105 meq/L (ref 98–109)
CO2: 27 meq/L (ref 22–29)
Creatinine: 0.9 mg/dL (ref 0.7–1.3)
Glucose: 198 mg/dl — ABNORMAL HIGH (ref 70–140)
POTASSIUM: 3.8 meq/L (ref 3.5–5.1)
SODIUM: 138 meq/L (ref 136–145)
TOTAL PROTEIN: 6.6 g/dL (ref 6.4–8.3)
Total Bilirubin: 1.16 mg/dL (ref 0.20–1.20)

## 2013-10-25 LAB — LACTATE DEHYDROGENASE (CC13): LDH: 182 U/L (ref 125–245)

## 2013-10-25 MED ORDER — SODIUM CHLORIDE 0.9 % IV SOLN: Freq: Once | INTRAVENOUS | Status: DC

## 2013-10-25 MED ORDER — SODIUM CHLORIDE 0.9 % IV SOLN
300.0000 mg/m2 | Freq: Once | INTRAVENOUS | Status: AC
  Administered 2013-10-25: 600 mg via INTRAVENOUS
  Filled 2013-10-25: qty 30

## 2013-10-25 MED ORDER — DEXTROSE 5 % IV SOLN
27.0000 mg/m2 | Freq: Once | INTRAVENOUS | Status: AC
  Administered 2013-10-25: 54 mg via INTRAVENOUS
  Filled 2013-10-25: qty 27

## 2013-10-25 MED ORDER — HEPARIN SOD (PORK) LOCK FLUSH 100 UNIT/ML IV SOLN
500.0000 [IU] | Freq: Once | INTRAVENOUS | Status: DC | PRN
  Filled 2013-10-25: qty 5

## 2013-10-25 MED ORDER — ONDANSETRON 8 MG/50ML IVPB (CHCC)
8.0000 mg | Freq: Once | INTRAVENOUS | Status: AC
  Administered 2013-10-25: 8 mg via INTRAVENOUS

## 2013-10-25 MED ORDER — DEXAMETHASONE SODIUM PHOSPHATE 10 MG/ML IJ SOLN
10.0000 mg | Freq: Once | INTRAMUSCULAR | Status: AC
  Administered 2013-10-25: 10 mg via INTRAVENOUS

## 2013-10-25 MED ORDER — SODIUM CHLORIDE 0.9 % IJ SOLN
10.0000 mL | INTRAMUSCULAR | Status: DC | PRN
  Administered 2013-10-25: 10 mL
  Filled 2013-10-25: qty 10

## 2013-10-25 MED ORDER — SODIUM CHLORIDE 0.9 % IV SOLN
Freq: Once | INTRAVENOUS | Status: AC
  Administered 2013-10-25: 12:00:00 via INTRAVENOUS

## 2013-10-25 MED ORDER — DEXAMETHASONE SODIUM PHOSPHATE 10 MG/ML IJ SOLN
INTRAMUSCULAR | Status: AC
  Filled 2013-10-25: qty 1

## 2013-10-25 MED ORDER — ONDANSETRON 8 MG/NS 50 ML IVPB
INTRAVENOUS | Status: AC
  Filled 2013-10-25: qty 8

## 2013-10-25 NOTE — Patient Instructions (Signed)
[facility] Discharge Instructions for Patients Receiving Chemotherapy  Today you received the following chemotherapy agents:  Cytoxan & Kyprolis  To help prevent nausea and vomiting after your treatment, we encourage you to take your nausea medication.   If you develop nausea and vomiting that is not controlled by your nausea medication, call the clinic.   BELOW ARE SYMPTOMS THAT SHOULD BE REPORTED IMMEDIATELY:  *FEVER GREATER THAN 100.5 F  *CHILLS WITH OR WITHOUT FEVER  NAUSEA AND VOMITING THAT IS NOT CONTROLLED WITH YOUR NAUSEA MEDICATION  *UNUSUAL SHORTNESS OF BREATH  *UNUSUAL BRUISING OR BLEEDING  TENDERNESS IN MOUTH AND THROAT WITH OR WITHOUT PRESENCE OF ULCERS  *URINARY PROBLEMS  *BOWEL PROBLEMS  UNUSUAL RASH Items with * indicate a potential emergency and should be followed up as soon as possible.  Feel free to call the clinic you have any questions or concerns. The clinic phone number is [phone].    

## 2013-10-26 ENCOUNTER — Ambulatory Visit (HOSPITAL_BASED_OUTPATIENT_CLINIC_OR_DEPARTMENT_OTHER): Payer: Medicare Other

## 2013-10-26 VITALS — BP 123/64 | HR 72 | Temp 97.9°F | Resp 18

## 2013-10-26 DIAGNOSIS — C9000 Multiple myeloma not having achieved remission: Secondary | ICD-10-CM

## 2013-10-26 DIAGNOSIS — Z5112 Encounter for antineoplastic immunotherapy: Secondary | ICD-10-CM

## 2013-10-26 MED ORDER — SODIUM CHLORIDE 0.9 % IV SOLN: Freq: Once | INTRAVENOUS | Status: AC

## 2013-10-26 MED ORDER — HEPARIN SOD (PORK) LOCK FLUSH 100 UNIT/ML IV SOLN
500.0000 [IU] | Freq: Once | INTRAVENOUS | Status: AC | PRN
  Administered 2013-10-26: 500 [IU]
  Filled 2013-10-26: qty 5

## 2013-10-26 MED ORDER — ONDANSETRON 8 MG/50ML IVPB (CHCC)
8.0000 mg | Freq: Once | INTRAVENOUS | Status: AC
  Administered 2013-10-26: 8 mg via INTRAVENOUS

## 2013-10-26 MED ORDER — ONDANSETRON 8 MG/NS 50 ML IVPB
INTRAVENOUS | Status: AC
  Filled 2013-10-26: qty 8

## 2013-10-26 MED ORDER — DEXAMETHASONE SODIUM PHOSPHATE 10 MG/ML IJ SOLN
10.0000 mg | Freq: Once | INTRAMUSCULAR | Status: AC
  Administered 2013-10-26: 10 mg via INTRAVENOUS

## 2013-10-26 MED ORDER — SODIUM CHLORIDE 0.9 % IJ SOLN
10.0000 mL | INTRAMUSCULAR | Status: DC | PRN
Start: 2013-10-26 — End: 2013-10-26
  Administered 2013-10-26: 10 mL
  Filled 2013-10-26: qty 10

## 2013-10-26 MED ORDER — DEXTROSE 5 % IV SOLN
27.0000 mg/m2 | Freq: Once | INTRAVENOUS | Status: AC
  Administered 2013-10-26: 54 mg via INTRAVENOUS
  Filled 2013-10-26: qty 27

## 2013-10-26 MED ORDER — DEXAMETHASONE SODIUM PHOSPHATE 10 MG/ML IJ SOLN
INTRAMUSCULAR | Status: AC
  Filled 2013-10-26: qty 1

## 2013-11-01 ENCOUNTER — Ambulatory Visit (HOSPITAL_BASED_OUTPATIENT_CLINIC_OR_DEPARTMENT_OTHER): Payer: Medicare Other

## 2013-11-01 ENCOUNTER — Other Ambulatory Visit (HOSPITAL_BASED_OUTPATIENT_CLINIC_OR_DEPARTMENT_OTHER): Payer: Medicare Other

## 2013-11-01 VITALS — BP 149/86 | HR 66 | Temp 98.4°F | Resp 16

## 2013-11-01 DIAGNOSIS — C9002 Multiple myeloma in relapse: Secondary | ICD-10-CM

## 2013-11-01 DIAGNOSIS — C9000 Multiple myeloma not having achieved remission: Secondary | ICD-10-CM

## 2013-11-01 DIAGNOSIS — Z5111 Encounter for antineoplastic chemotherapy: Secondary | ICD-10-CM

## 2013-11-01 DIAGNOSIS — Z5112 Encounter for antineoplastic immunotherapy: Secondary | ICD-10-CM

## 2013-11-01 LAB — CBC WITH DIFFERENTIAL/PLATELET
BASO%: 0.3 % (ref 0.0–2.0)
Basophils Absolute: 0 10*3/uL (ref 0.0–0.1)
EOS%: 0.6 % (ref 0.0–7.0)
Eosinophils Absolute: 0 10*3/uL (ref 0.0–0.5)
HEMATOCRIT: 28 % — AB (ref 38.4–49.9)
HGB: 8.9 g/dL — ABNORMAL LOW (ref 13.0–17.1)
LYMPH#: 0.5 10*3/uL — AB (ref 0.9–3.3)
LYMPH%: 14.5 % (ref 14.0–49.0)
MCH: 24.1 pg — AB (ref 27.2–33.4)
MCHC: 31.8 g/dL — AB (ref 32.0–36.0)
MCV: 75.7 fL — ABNORMAL LOW (ref 79.3–98.0)
MONO#: 0.3 10*3/uL (ref 0.1–0.9)
MONO%: 8.3 % (ref 0.0–14.0)
NEUT#: 2.5 10*3/uL (ref 1.5–6.5)
NEUT%: 76.3 % — AB (ref 39.0–75.0)
Platelets: 99 10*3/uL — ABNORMAL LOW (ref 140–400)
RBC: 3.7 10*6/uL — ABNORMAL LOW (ref 4.20–5.82)
RDW: 15.4 % — ABNORMAL HIGH (ref 11.0–14.6)
WBC: 3.2 10*3/uL — ABNORMAL LOW (ref 4.0–10.3)
nRBC: 1 % — ABNORMAL HIGH (ref 0–0)

## 2013-11-01 LAB — COMPREHENSIVE METABOLIC PANEL (CC13)
ALT: 10 U/L (ref 0–55)
AST: 13 U/L (ref 5–34)
Albumin: 3.2 g/dL — ABNORMAL LOW (ref 3.5–5.0)
Alkaline Phosphatase: 37 U/L — ABNORMAL LOW (ref 40–150)
Anion Gap: 9 mEq/L (ref 3–11)
BILIRUBIN TOTAL: 1.32 mg/dL — AB (ref 0.20–1.20)
BUN: 7.4 mg/dL (ref 7.0–26.0)
CALCIUM: 9 mg/dL (ref 8.4–10.4)
CHLORIDE: 105 meq/L (ref 98–109)
CO2: 25 meq/L (ref 22–29)
CREATININE: 0.9 mg/dL (ref 0.7–1.3)
GLUCOSE: 138 mg/dL (ref 70–140)
Potassium: 3.9 mEq/L (ref 3.5–5.1)
Sodium: 140 mEq/L (ref 136–145)
Total Protein: 6.4 g/dL (ref 6.4–8.3)

## 2013-11-01 LAB — TECHNOLOGIST REVIEW

## 2013-11-01 MED ORDER — HEPARIN SOD (PORK) LOCK FLUSH 100 UNIT/ML IV SOLN
500.0000 [IU] | Freq: Once | INTRAVENOUS | Status: AC | PRN
  Administered 2013-11-01: 500 [IU]
  Filled 2013-11-01: qty 5

## 2013-11-01 MED ORDER — ONDANSETRON 8 MG/NS 50 ML IVPB
INTRAVENOUS | Status: AC
  Filled 2013-11-01: qty 8

## 2013-11-01 MED ORDER — SODIUM CHLORIDE 0.9 % IV SOLN
Freq: Once | INTRAVENOUS | Status: AC
  Administered 2013-11-01: 11:00:00 via INTRAVENOUS

## 2013-11-01 MED ORDER — DEXTROSE 5 % IV SOLN
27.0000 mg/m2 | Freq: Once | INTRAVENOUS | Status: AC
  Administered 2013-11-01: 54 mg via INTRAVENOUS
  Filled 2013-11-01: qty 27

## 2013-11-01 MED ORDER — SODIUM CHLORIDE 0.9 % IV SOLN
300.0000 mg/m2 | Freq: Once | INTRAVENOUS | Status: AC
  Administered 2013-11-01: 600 mg via INTRAVENOUS
  Filled 2013-11-01: qty 30

## 2013-11-01 MED ORDER — SODIUM CHLORIDE 0.9 % IV SOLN: Freq: Once | INTRAVENOUS | Status: DC

## 2013-11-01 MED ORDER — DEXAMETHASONE SODIUM PHOSPHATE 10 MG/ML IJ SOLN
INTRAMUSCULAR | Status: AC
  Filled 2013-11-01: qty 1

## 2013-11-01 MED ORDER — ONDANSETRON 8 MG/50ML IVPB (CHCC)
8.0000 mg | Freq: Once | INTRAVENOUS | Status: AC
  Administered 2013-11-01: 8 mg via INTRAVENOUS

## 2013-11-01 MED ORDER — SODIUM CHLORIDE 0.9 % IJ SOLN
10.0000 mL | INTRAMUSCULAR | Status: DC | PRN
  Administered 2013-11-01: 10 mL
  Filled 2013-11-01: qty 10

## 2013-11-01 MED ORDER — DEXAMETHASONE SODIUM PHOSPHATE 10 MG/ML IJ SOLN
10.0000 mg | Freq: Once | INTRAMUSCULAR | Status: AC
  Administered 2013-11-01: 10 mg via INTRAVENOUS

## 2013-11-01 NOTE — Patient Instructions (Signed)
[facility] Discharge Instructions for Patients Receiving Chemotherapy  Today you received the following chemotherapy agents Cytoxan and Kyprolis.  To help prevent nausea and vomiting after your treatment, we encourage you to take your nausea medication as prescribed.   If you develop nausea and vomiting that is not controlled by your nausea medication, call the clinic.   BELOW ARE SYMPTOMS THAT SHOULD BE REPORTED IMMEDIATELY:  *FEVER GREATER THAN 100.5 F  *CHILLS WITH OR WITHOUT FEVER  NAUSEA AND VOMITING THAT IS NOT CONTROLLED WITH YOUR NAUSEA MEDICATION  *UNUSUAL SHORTNESS OF BREATH  *UNUSUAL BRUISING OR BLEEDING  TENDERNESS IN MOUTH AND THROAT WITH OR WITHOUT PRESENCE OF ULCERS  *URINARY PROBLEMS  *BOWEL PROBLEMS  UNUSUAL RASH Items with * indicate a potential emergency and should be followed up as soon as possible.  Feel free to call the clinic you have any questions or concerns. The clinic phone number is [phone].    

## 2013-11-01 NOTE — Progress Notes (Signed)
Per Dr. Mohamed, ok to treat with platelets of 99. 

## 2013-11-02 ENCOUNTER — Ambulatory Visit (HOSPITAL_BASED_OUTPATIENT_CLINIC_OR_DEPARTMENT_OTHER): Payer: Medicare Other

## 2013-11-02 DIAGNOSIS — C9000 Multiple myeloma not having achieved remission: Secondary | ICD-10-CM

## 2013-11-02 DIAGNOSIS — Z5112 Encounter for antineoplastic immunotherapy: Secondary | ICD-10-CM

## 2013-11-02 MED ORDER — DEXAMETHASONE SODIUM PHOSPHATE 10 MG/ML IJ SOLN
10.0000 mg | Freq: Once | INTRAMUSCULAR | Status: AC
  Administered 2013-11-02: 10 mg via INTRAVENOUS

## 2013-11-02 MED ORDER — SODIUM CHLORIDE 0.9 % IV SOLN
Freq: Once | INTRAVENOUS | Status: AC
  Administered 2013-11-02: 11:00:00 via INTRAVENOUS

## 2013-11-02 MED ORDER — ONDANSETRON 8 MG/NS 50 ML IVPB
INTRAVENOUS | Status: AC
  Filled 2013-11-02: qty 8

## 2013-11-02 MED ORDER — ONDANSETRON 8 MG/50ML IVPB (CHCC)
8.0000 mg | Freq: Once | INTRAVENOUS | Status: AC
  Administered 2013-11-02: 8 mg via INTRAVENOUS

## 2013-11-02 MED ORDER — SODIUM CHLORIDE 0.9 % IJ SOLN
10.0000 mL | INTRAMUSCULAR | Status: DC | PRN
Start: 2013-11-02 — End: 2013-11-02
  Administered 2013-11-02: 10 mL
  Filled 2013-11-02: qty 10

## 2013-11-02 MED ORDER — CARFILZOMIB CHEMO INJECTION 60 MG
27.0000 mg/m2 | Freq: Once | INTRAVENOUS | Status: AC
  Administered 2013-11-02: 54 mg via INTRAVENOUS
  Filled 2013-11-02: qty 27

## 2013-11-02 MED ORDER — HEPARIN SOD (PORK) LOCK FLUSH 100 UNIT/ML IV SOLN
500.0000 [IU] | Freq: Once | INTRAVENOUS | Status: AC | PRN
Start: 2013-11-02 — End: 2013-11-02
  Administered 2013-11-02: 500 [IU]
  Filled 2013-11-02: qty 5

## 2013-11-02 MED ORDER — DEXAMETHASONE SODIUM PHOSPHATE 10 MG/ML IJ SOLN
INTRAMUSCULAR | Status: AC
  Filled 2013-11-02: qty 1

## 2013-11-02 MED ORDER — SODIUM CHLORIDE 0.9 % IV SOLN: Freq: Once | INTRAVENOUS | Status: DC

## 2013-11-02 NOTE — Patient Instructions (Signed)
[facility] Discharge Instructions for Patients Receiving Chemotherapy  Today you received the following chemotherapy agents Kyprolis.  To help prevent nausea and vomiting after your treatment, we encourage you to take your nausea medication.   If you develop nausea and vomiting that is not controlled by your nausea medication, call the clinic.   BELOW ARE SYMPTOMS THAT SHOULD BE REPORTED IMMEDIATELY:  *FEVER GREATER THAN 100.5 F  *CHILLS WITH OR WITHOUT FEVER  NAUSEA AND VOMITING THAT IS NOT CONTROLLED WITH YOUR NAUSEA MEDICATION  *UNUSUAL SHORTNESS OF BREATH  *UNUSUAL BRUISING OR BLEEDING  TENDERNESS IN MOUTH AND THROAT WITH OR WITHOUT PRESENCE OF ULCERS  *URINARY PROBLEMS  *BOWEL PROBLEMS  UNUSUAL RASH Items with * indicate a potential emergency and should be followed up as soon as possible.  Feel free to call the clinic you have any questions or concerns. The clinic phone number is [phone].    

## 2013-11-08 ENCOUNTER — Other Ambulatory Visit (HOSPITAL_BASED_OUTPATIENT_CLINIC_OR_DEPARTMENT_OTHER): Payer: Medicare Other

## 2013-11-08 DIAGNOSIS — C9002 Multiple myeloma in relapse: Secondary | ICD-10-CM

## 2013-11-08 DIAGNOSIS — C9000 Multiple myeloma not having achieved remission: Secondary | ICD-10-CM

## 2013-11-08 LAB — CBC WITH DIFFERENTIAL/PLATELET
BASO%: 0 % (ref 0.0–2.0)
Basophils Absolute: 0 10*3/uL (ref 0.0–0.1)
EOS%: 0.3 % (ref 0.0–7.0)
Eosinophils Absolute: 0 10*3/uL (ref 0.0–0.5)
HEMATOCRIT: 28.4 % — AB (ref 38.4–49.9)
HGB: 9.1 g/dL — ABNORMAL LOW (ref 13.0–17.1)
LYMPH%: 17.6 % (ref 14.0–49.0)
MCH: 24.3 pg — ABNORMAL LOW (ref 27.2–33.4)
MCHC: 32 g/dL (ref 32.0–36.0)
MCV: 75.7 fL — AB (ref 79.3–98.0)
MONO#: 0.3 10*3/uL (ref 0.1–0.9)
MONO%: 9.7 % (ref 0.0–14.0)
NEUT#: 2.1 10*3/uL (ref 1.5–6.5)
NEUT%: 72.4 % (ref 39.0–75.0)
RBC: 3.75 10*6/uL — ABNORMAL LOW (ref 4.20–5.82)
RDW: 15.8 % — ABNORMAL HIGH (ref 11.0–14.6)
WBC: 2.9 10*3/uL — AB (ref 4.0–10.3)
lymph#: 0.5 10*3/uL — ABNORMAL LOW (ref 0.9–3.3)
nRBC: 0 % (ref 0–0)

## 2013-11-08 LAB — COMPREHENSIVE METABOLIC PANEL (CC13)
ALT: 10 U/L (ref 0–55)
ANION GAP: 8 meq/L (ref 3–11)
AST: 13 U/L (ref 5–34)
Albumin: 3.4 g/dL — ABNORMAL LOW (ref 3.5–5.0)
Alkaline Phosphatase: 42 U/L (ref 40–150)
BUN: 6.3 mg/dL — AB (ref 7.0–26.0)
CALCIUM: 9.2 mg/dL (ref 8.4–10.4)
CO2: 25 meq/L (ref 22–29)
CREATININE: 1 mg/dL (ref 0.7–1.3)
Chloride: 106 mEq/L (ref 98–109)
Glucose: 173 mg/dl — ABNORMAL HIGH (ref 70–140)
Potassium: 3.8 mEq/L (ref 3.5–5.1)
Sodium: 139 mEq/L (ref 136–145)
Total Bilirubin: 1.54 mg/dL — ABNORMAL HIGH (ref 0.20–1.20)
Total Protein: 6.5 g/dL (ref 6.4–8.3)

## 2013-11-08 LAB — TECHNOLOGIST REVIEW

## 2013-11-13 ENCOUNTER — Other Ambulatory Visit: Payer: Self-pay | Admitting: Internal Medicine

## 2013-11-15 ENCOUNTER — Ambulatory Visit (HOSPITAL_BASED_OUTPATIENT_CLINIC_OR_DEPARTMENT_OTHER): Payer: Medicare Other

## 2013-11-15 ENCOUNTER — Telehealth: Payer: Self-pay | Admitting: Physician Assistant

## 2013-11-15 ENCOUNTER — Other Ambulatory Visit (HOSPITAL_BASED_OUTPATIENT_CLINIC_OR_DEPARTMENT_OTHER): Payer: Medicare Other

## 2013-11-15 ENCOUNTER — Encounter: Payer: Self-pay | Admitting: Physician Assistant

## 2013-11-15 ENCOUNTER — Ambulatory Visit (HOSPITAL_BASED_OUTPATIENT_CLINIC_OR_DEPARTMENT_OTHER): Payer: Medicare Other | Admitting: Physician Assistant

## 2013-11-15 ENCOUNTER — Telehealth: Payer: Self-pay | Admitting: *Deleted

## 2013-11-15 VITALS — BP 125/71 | HR 68 | Temp 97.8°F | Resp 71 | Ht 71.0 in | Wt 178.1 lb

## 2013-11-15 DIAGNOSIS — Z5111 Encounter for antineoplastic chemotherapy: Secondary | ICD-10-CM

## 2013-11-15 DIAGNOSIS — Z5112 Encounter for antineoplastic immunotherapy: Secondary | ICD-10-CM

## 2013-11-15 DIAGNOSIS — C9002 Multiple myeloma in relapse: Secondary | ICD-10-CM

## 2013-11-15 DIAGNOSIS — C9000 Multiple myeloma not having achieved remission: Secondary | ICD-10-CM

## 2013-11-15 LAB — CBC WITH DIFFERENTIAL/PLATELET
BASO%: 0.4 % (ref 0.0–2.0)
Basophils Absolute: 0 10*3/uL (ref 0.0–0.1)
EOS%: 0.5 % (ref 0.0–7.0)
Eosinophils Absolute: 0 10*3/uL (ref 0.0–0.5)
HCT: 30.1 % — ABNORMAL LOW (ref 38.4–49.9)
HGB: 9.3 g/dL — ABNORMAL LOW (ref 13.0–17.1)
LYMPH%: 21.5 % (ref 14.0–49.0)
MCH: 24.1 pg — ABNORMAL LOW (ref 27.2–33.4)
MCHC: 31 g/dL — ABNORMAL LOW (ref 32.0–36.0)
MCV: 77.8 fL — ABNORMAL LOW (ref 79.3–98.0)
MONO#: 0.4 10*3/uL (ref 0.1–0.9)
MONO%: 13.1 % (ref 0.0–14.0)
NEUT%: 64.5 % (ref 39.0–75.0)
NEUTROS ABS: 1.9 10*3/uL (ref 1.5–6.5)
Platelets: 176 10*3/uL (ref 140–400)
RBC: 3.87 10*6/uL — ABNORMAL LOW (ref 4.20–5.82)
RDW: 15.7 % — AB (ref 11.0–14.6)
WBC: 2.9 10*3/uL — AB (ref 4.0–10.3)
lymph#: 0.6 10*3/uL — ABNORMAL LOW (ref 0.9–3.3)

## 2013-11-15 LAB — COMPREHENSIVE METABOLIC PANEL (CC13)
ALBUMIN: 3.4 g/dL — AB (ref 3.5–5.0)
ALK PHOS: 38 U/L — AB (ref 40–150)
ALT: 14 U/L (ref 0–55)
ANION GAP: 10 meq/L (ref 3–11)
AST: 17 U/L (ref 5–34)
BUN: 12.1 mg/dL (ref 7.0–26.0)
CO2: 24 meq/L (ref 22–29)
Calcium: 9.3 mg/dL (ref 8.4–10.4)
Chloride: 104 mEq/L (ref 98–109)
Creatinine: 1.1 mg/dL (ref 0.7–1.3)
GLUCOSE: 171 mg/dL — AB (ref 70–140)
POTASSIUM: 3.8 meq/L (ref 3.5–5.1)
SODIUM: 138 meq/L (ref 136–145)
TOTAL PROTEIN: 6.7 g/dL (ref 6.4–8.3)
Total Bilirubin: 1.17 mg/dL (ref 0.20–1.20)

## 2013-11-15 MED ORDER — HEPARIN SOD (PORK) LOCK FLUSH 100 UNIT/ML IV SOLN
500.0000 [IU] | Freq: Once | INTRAVENOUS | Status: AC | PRN
  Administered 2013-11-15: 500 [IU]
  Filled 2013-11-15: qty 5

## 2013-11-15 MED ORDER — DEXAMETHASONE SODIUM PHOSPHATE 10 MG/ML IJ SOLN
10.0000 mg | Freq: Once | INTRAMUSCULAR | Status: AC
  Administered 2013-11-15: 10 mg via INTRAVENOUS

## 2013-11-15 MED ORDER — ONDANSETRON 8 MG/50ML IVPB (CHCC)
8.0000 mg | Freq: Once | INTRAVENOUS | Status: AC
  Administered 2013-11-15: 8 mg via INTRAVENOUS

## 2013-11-15 MED ORDER — SODIUM CHLORIDE 0.9 % IV SOLN
Freq: Once | INTRAVENOUS | Status: AC
  Administered 2013-11-15: 10:00:00 via INTRAVENOUS

## 2013-11-15 MED ORDER — ONDANSETRON 8 MG/NS 50 ML IVPB
INTRAVENOUS | Status: AC
  Filled 2013-11-15: qty 8

## 2013-11-15 MED ORDER — SODIUM CHLORIDE 0.9 % IJ SOLN
10.0000 mL | INTRAMUSCULAR | Status: DC | PRN
Start: 2013-11-15 — End: 2013-11-15
  Administered 2013-11-15: 10 mL
  Filled 2013-11-15: qty 10

## 2013-11-15 MED ORDER — CARFILZOMIB CHEMO INJECTION 60 MG
27.0000 mg/m2 | Freq: Once | INTRAVENOUS | Status: AC
  Administered 2013-11-15: 54 mg via INTRAVENOUS
  Filled 2013-11-15: qty 27

## 2013-11-15 MED ORDER — DEXAMETHASONE SODIUM PHOSPHATE 10 MG/ML IJ SOLN
INTRAMUSCULAR | Status: AC
  Filled 2013-11-15: qty 1

## 2013-11-15 MED ORDER — VALACYCLOVIR HCL 500 MG PO TABS: ORAL_TABLET | ORAL | Status: DC

## 2013-11-15 MED ORDER — CYCLOPHOSPHAMIDE CHEMO INJECTION 1 GM
300.0000 mg/m2 | Freq: Once | INTRAMUSCULAR | Status: AC
  Administered 2013-11-15: 600 mg via INTRAVENOUS
  Filled 2013-11-15: qty 30

## 2013-11-15 NOTE — Telephone Encounter (Signed)
Pt confirmed labs/ov per 10/27 POF, sent msg to add chemo, gave pt AVS..... KJ

## 2013-11-15 NOTE — Patient Instructions (Signed)
[facility] Discharge Instructions for Patients Receiving Chemotherapy  Today you received the following chemotherapy agents Cytoxan/Kyprolis.   To help prevent nausea and vomiting after your treatment, we encourage you to take your nausea medication as directed.    If you develop nausea and vomiting that is not controlled by your nausea medication, call the clinic.   BELOW ARE SYMPTOMS THAT SHOULD BE REPORTED IMMEDIATELY:  *FEVER GREATER THAN 100.5 F  *CHILLS WITH OR WITHOUT FEVER  NAUSEA AND VOMITING THAT IS NOT CONTROLLED WITH YOUR NAUSEA MEDICATION  *UNUSUAL SHORTNESS OF BREATH  *UNUSUAL BRUISING OR BLEEDING  TENDERNESS IN MOUTH AND THROAT WITH OR WITHOUT PRESENCE OF ULCERS  *URINARY PROBLEMS  *BOWEL PROBLEMS  UNUSUAL RASH Items with * indicate a potential emergency and should be followed up as soon as possible.  Feel free to call the clinic you have any questions or concerns. The clinic phone number is [phone].    

## 2013-11-15 NOTE — Progress Notes (Signed)
[place] [phone]   [phone]  OFFICE PROGRESS NOTE  Eddie Thomas [address] 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   PRIOR THERAPY: :  1. Status post 6 cycles of systemic chemotherapy with Revlimid and Decadron, last dose was given 07/21/2010 with very good response. 2. 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 [name].  3. maintenance Revlimid at 10 mg by mouth daily status post 2 months. Therapy began 01/18/2011. 4. maintenance Revlimid at 15 mg by mouth daily with prophylactic dose Coumadin at 2 mg by mouth daily. 5. Systemic chemotherapy with Velcade at 1.3 mg per meter squared given on days 1, 4, 8 and 11 and Doxil at 30 mg per meter square given on day 4 and Decadron 40 mg by mouth on weekly basis given every 3 weeks. Status post 4 cycles. 6. Zometa 4 mg IV every 4 weeks.  7. Velcade 1.3 mg/M2 subcutaneous daily on a weekly basis with Decadron 20 mg by mouth on a weekly basis. First cycle expected on 06/21/2012. S/P 4 cycles.  CURRENT THERAPY:  1) Systemic chemotherapy with Carfilzomib, cyclophosphamide and Decadron. First cycle started on 08/09/2012. He is status post 14 cycles. 2) Zometa every 8 weeks. Next dose 05/27/2013.   INTERVAL HISTORY: Eddie Thomas 68 y.o. male returns to the clinic today for followup visit. He is currently on treatment with Carfilzomib, Cytoxan and dexamethasone status post 14 cycles and tolerating his treatment fairly well. He reports some occasional numbness and tingling in his toes but states that these are stable symptoms and have been present for a while. He is recent protein studies that were performed [facility] in September showed no evidence for disease progression. He denied having any significant weight loss or night sweats. He has no fever or chills.  He has no nausea or vomiting. The patient denied having any significant chest pain, shortness of breath, cough or hemoptysis.   MEDICAL HISTORY: Past Medical History  Diagnosis Date  . Multiple myeloma   . Diabetes mellitus 07/03/2011  . Hypertension 07/03/2011  . Hyperlipidemia 07/03/2011  . Colon polyps 2012  . Gastric ulcer     ALLERGIES:  has No Known Allergies.  MEDICATIONS:  Current Outpatient Prescriptions  Medication Sig Dispense Refill  . Cholecalciferol (VITAMIN D-3) 5000 UNITS TABS Take 1 tablet by mouth daily.      [name] lidocaine-prilocaine (EMLA) cream APPLY CREAM TOPICALLY TO AFFECTED AREA AS DIRECTED  30 g  1  . lisinopril (PRINIVIL,ZESTRIL) 10 MG tablet Take 5 mg by mouth every evening.       . pantoprazole (PROTONIX) 40 MG tablet Take 1 tablet (40 mg total) by mouth 2 (two) times daily.  60 tablet  11  . pioglitazone (ACTOS) 15 MG tablet Take 15 mg by mouth daily.      . prochlorperazine (COMPAZINE) 10 MG tablet Take 10 mg by mouth every 6 (six) hours as needed for nausea or vomiting.      . simvastatin (ZOCOR) 10 MG tablet Take 10 mg by mouth at bedtime.        . valACYclovir (VALTREX) 500 MG tablet TAKE ONE TABLET BY MOUTH ONCE DAILY  30 tablet  3  . oxyCODONE-acetaminophen (PERCOCET/ROXICET) 5-325 MG per tablet Take 1 tablet by mouth every 6 (six) hours as needed for severe pain.  30 tablet  0  . temazepam (RESTORIL) 15 MG capsule Take 15 mg by mouth at bedtime as needed for sleep.      [name] VIAGRA 50 MG tablet Take 50 mg by mouth daily as needed for erectile dysfunction.        No current facility-administered medications for this visit.    SURGICAL HISTORY:  Past Surgical History  Procedure Laterality Date  . Limbal stem cell transplant    . Humerus fracture surgery      right  . 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    REVIEW OF SYSTEMS:  Constitutional: negative Eyes: negative Ears, nose, mouth, throat, and face: negative Respiratory:  negative Cardiovascular: negative Gastrointestinal: negative Genitourinary:negative Integument/breast: negative Hematologic/lymphatic: negative Musculoskeletal:negative Neurological: positive for paresthesia Behavioral/Psych: negative Endocrine: negative Allergic/Immunologic: negative   PHYSICAL EXAMINATION: General appearance: alert, cooperative and no distress Head: Normocephalic, without obvious abnormality, atraumatic Neck: no adenopathy, no JVD, supple, symmetrical, trachea midline and thyroid not enlarged, symmetric, no tenderness/mass/nodules Lymph nodes: Cervical, supraclavicular, and axillary nodes normal. Resp: clear to auscultation bilaterally Back: symmetric, no curvature. ROM normal. No CVA tenderness. Cardio: regular rate and rhythm, S1, S2 normal, no murmur, click, rub or gallop GI: soft, non-tender; bowel sounds normal; no masses,  no organomegaly Extremities: extremities normal, atraumatic, no cyanosis or edema Neurologic: Alert and oriented X 3, normal strength and tone. Normal symmetric reflexes. Normal coordination and gait  ECOG PERFORMANCE STATUS: 1 - Symptomatic but completely ambulatory  Blood pressure 125/71, pulse 68, temperature 97.8 F (36.6 C), temperature source Oral, resp. rate 71, height 5' 11"  (1.803 m), weight 178 lb 1.6 oz (80.786 kg), SpO2 100.00%.  LABORATORY DATA: Lab Results  Component Value Date   WBC 2.9* 11/15/2013   HGB 9.3* 11/15/2013   HCT 30.1* 11/15/2013   MCV 77.8* 11/15/2013   PLT 176 11/15/2013      Chemistry      Component Value Date/Time   NA 138 11/15/2013 0812   NA 139 08/30/2013 0935   K 3.8 11/15/2013 0812   K 4.1 08/30/2013 0935   CL 101 08/30/2013 0935   CL 103 07/12/2012 0907   CO2 24 11/15/2013 0812   CO2 26 08/30/2013 0935   BUN 12.1 11/15/2013 0812   BUN 11 08/30/2013 0935   CREATININE 1.1 11/15/2013 0812   CREATININE 1.03 08/30/2013 0935      Component Value Date/Time   CALCIUM 9.3 11/15/2013 0812    CALCIUM 9.2 08/30/2013 0935   ALKPHOS 38* 11/15/2013 0812   ALKPHOS 39 08/30/2013 0935   AST 17 11/15/2013 0812   AST 13 08/30/2013 0935   ALT 14 11/15/2013 0812   ALT 9 08/30/2013 0935   BILITOT 1.17 11/15/2013 0812   BILITOT 1.3* 08/30/2013 0935     Other lab results:   RADIOGRAPHIC STUDIES: No results found.  ASSESSMENT AND PLAN: This is a very pleasant 68 years old [place] American male with history of multiple myeloma currently undergoing systemic chemotherapy with Carfilzomib, cyclophosphamide and Decadron status post 14 cycles.  He will continue on his current treatment and proceed with cycle #15 today. He will return for a followup visit in 1 month for reevaluation prior to cycle #16. He'll continue on Zometa every 2 months. He was advised to call immediately if he has any concerning symptoms in the interval.  The patient voices understanding of current disease status and treatment options and is in agreement with the current care plan. All questions were answered.  The patient knows to call the clinic with any problems, questions or concerns. We can certainly see the patient much sooner if necessary.  Carlton Adam, PA-C   Disclaimer: This note was dictated with voice recognition software. Similar sounding words can inadvertently be transcribed and may not be corrected upon review.

## 2013-11-15 NOTE — Telephone Encounter (Signed)
Per staff message and POF I have scheduled appts. Advised scheduler of appts. JMW  

## 2013-11-16 ENCOUNTER — Ambulatory Visit (HOSPITAL_BASED_OUTPATIENT_CLINIC_OR_DEPARTMENT_OTHER): Payer: Medicare Other

## 2013-11-16 VITALS — BP 113/65 | HR 65 | Temp 98.5°F | Resp 16

## 2013-11-16 DIAGNOSIS — C9000 Multiple myeloma not having achieved remission: Secondary | ICD-10-CM

## 2013-11-16 DIAGNOSIS — Z5112 Encounter for antineoplastic immunotherapy: Secondary | ICD-10-CM

## 2013-11-16 MED ORDER — DEXAMETHASONE SODIUM PHOSPHATE 10 MG/ML IJ SOLN
10.0000 mg | Freq: Once | INTRAMUSCULAR | Status: AC
  Administered 2013-11-16: 10 mg via INTRAVENOUS

## 2013-11-16 MED ORDER — ONDANSETRON 8 MG/NS 50 ML IVPB
INTRAVENOUS | Status: AC
  Filled 2013-11-16: qty 8

## 2013-11-16 MED ORDER — SODIUM CHLORIDE 0.9 % IJ SOLN
10.0000 mL | INTRAMUSCULAR | Status: DC | PRN
  Administered 2013-11-16: 10 mL
  Filled 2013-11-16: qty 10

## 2013-11-16 MED ORDER — SODIUM CHLORIDE 0.9 % IV SOLN
Freq: Once | INTRAVENOUS | Status: AC
Start: 2013-11-16 — End: 2013-11-16
  Administered 2013-11-16: 10:00:00 via INTRAVENOUS

## 2013-11-16 MED ORDER — HEPARIN SOD (PORK) LOCK FLUSH 100 UNIT/ML IV SOLN
500.0000 [IU] | Freq: Once | INTRAVENOUS | Status: AC | PRN
  Administered 2013-11-16: 500 [IU]
  Filled 2013-11-16: qty 5

## 2013-11-16 MED ORDER — ZOLEDRONIC ACID 4 MG/100ML IV SOLN
4.0000 mg | Freq: Once | INTRAVENOUS | Status: AC
  Administered 2013-11-16: 4 mg via INTRAVENOUS
  Filled 2013-11-16: qty 100

## 2013-11-16 MED ORDER — ONDANSETRON 8 MG/50ML IVPB (CHCC)
8.0000 mg | Freq: Once | INTRAVENOUS | Status: AC
  Administered 2013-11-16: 8 mg via INTRAVENOUS

## 2013-11-16 MED ORDER — DEXTROSE 5 % IV SOLN
27.0000 mg/m2 | Freq: Once | INTRAVENOUS | Status: AC
  Administered 2013-11-16: 54 mg via INTRAVENOUS
  Filled 2013-11-16: qty 27

## 2013-11-16 MED ORDER — DEXAMETHASONE SODIUM PHOSPHATE 10 MG/ML IJ SOLN
INTRAMUSCULAR | Status: AC
  Filled 2013-11-16: qty 1

## 2013-11-16 NOTE — Patient Instructions (Signed)
[place] Discharge Instructions for Patients Receiving Chemotherapy  Today you received the following chemotherapy agents: kyprolis, zometa  To help prevent nausea and vomiting after your treatment, we encourage you to take your nausea medication.  Take it as often as prescribed.     If you develop nausea and vomiting that is not controlled by your nausea medication, call the clinic. If it is after clinic hours your family physician or the after hours number for the clinic or go to the Emergency Department.   BELOW ARE SYMPTOMS THAT SHOULD BE REPORTED IMMEDIATELY:  *FEVER GREATER THAN 100.5 F  *CHILLS WITH OR WITHOUT FEVER  NAUSEA AND VOMITING THAT IS NOT CONTROLLED WITH YOUR NAUSEA MEDICATION  *UNUSUAL SHORTNESS OF BREATH  *UNUSUAL BRUISING OR BLEEDING  TENDERNESS IN MOUTH AND THROAT WITH OR WITHOUT PRESENCE OF ULCERS  *URINARY PROBLEMS  *BOWEL PROBLEMS  UNUSUAL RASH Items with * indicate a potential emergency and should be followed up as soon as possible.  Feel free to call the clinic you have any questions or concerns. The clinic phone number is [phone].   I have been informed and understand all the instructions given to me. I know to contact the clinic, my physician, or go to the Emergency Department if any problems should occur. I do not have any questions at this time, but understand that I may call the clinic during office hours   should I have any questions or need assistance in obtaining follow up care.    __________________________________________  _____________  __________ Signature of Patient or Authorized Representative            Date                   Time    __________________________________________ Nurse's Signature

## 2013-11-18 NOTE — Patient Instructions (Signed)
Continue labs and chemotherapy is scheduled Follow-up in one month

## 2013-11-22 ENCOUNTER — Ambulatory Visit (HOSPITAL_BASED_OUTPATIENT_CLINIC_OR_DEPARTMENT_OTHER): Payer: Medicare Other

## 2013-11-22 ENCOUNTER — Other Ambulatory Visit (HOSPITAL_BASED_OUTPATIENT_CLINIC_OR_DEPARTMENT_OTHER): Payer: Medicare Other

## 2013-11-22 DIAGNOSIS — Z5111 Encounter for antineoplastic chemotherapy: Secondary | ICD-10-CM

## 2013-11-22 DIAGNOSIS — Z5112 Encounter for antineoplastic immunotherapy: Secondary | ICD-10-CM

## 2013-11-22 DIAGNOSIS — C9000 Multiple myeloma not having achieved remission: Secondary | ICD-10-CM

## 2013-11-22 DIAGNOSIS — C9002 Multiple myeloma in relapse: Secondary | ICD-10-CM

## 2013-11-22 LAB — CBC WITH DIFFERENTIAL/PLATELET
BASO%: 0.4 % (ref 0.0–2.0)
BASOS ABS: 0 10*3/uL (ref 0.0–0.1)
EOS%: 0.8 % (ref 0.0–7.0)
Eosinophils Absolute: 0 10*3/uL (ref 0.0–0.5)
HCT: 31.7 % — ABNORMAL LOW (ref 38.4–49.9)
HEMOGLOBIN: 9.7 g/dL — AB (ref 13.0–17.1)
LYMPH#: 0.6 10*3/uL — AB (ref 0.9–3.3)
LYMPH%: 19.9 % (ref 14.0–49.0)
MCH: 23.9 pg — AB (ref 27.2–33.4)
MCHC: 30.7 g/dL — AB (ref 32.0–36.0)
MCV: 78 fL — ABNORMAL LOW (ref 79.3–98.0)
MONO#: 0.4 10*3/uL (ref 0.1–0.9)
MONO%: 13.2 % (ref 0.0–14.0)
NEUT#: 2.1 10*3/uL (ref 1.5–6.5)
NEUT%: 65.7 % (ref 39.0–75.0)
Platelets: 125 10*3/uL — ABNORMAL LOW (ref 140–400)
RBC: 4.06 10*6/uL — ABNORMAL LOW (ref 4.20–5.82)
RDW: 15.1 % — AB (ref 11.0–14.6)
WBC: 3.2 10*3/uL — AB (ref 4.0–10.3)

## 2013-11-22 LAB — COMPREHENSIVE METABOLIC PANEL (CC13)
ALBUMIN: 3.6 g/dL (ref 3.5–5.0)
ALT: 14 U/L (ref 0–55)
AST: 14 U/L (ref 5–34)
Alkaline Phosphatase: 40 U/L (ref 40–150)
Anion Gap: 9 mEq/L (ref 3–11)
BUN: 9.9 mg/dL (ref 7.0–26.0)
CHLORIDE: 106 meq/L (ref 98–109)
CO2: 23 mEq/L (ref 22–29)
CREATININE: 1.1 mg/dL (ref 0.7–1.3)
Calcium: 9.1 mg/dL (ref 8.4–10.4)
Glucose: 188 mg/dl — ABNORMAL HIGH (ref 70–140)
POTASSIUM: 4.3 meq/L (ref 3.5–5.1)
Sodium: 139 mEq/L (ref 136–145)
Total Bilirubin: 1.18 mg/dL (ref 0.20–1.20)
Total Protein: 6.9 g/dL (ref 6.4–8.3)

## 2013-11-22 MED ORDER — DEXTROSE 5 % IV SOLN
27.0000 mg/m2 | Freq: Once | INTRAVENOUS | Status: AC
  Administered 2013-11-22: 54 mg via INTRAVENOUS
  Filled 2013-11-22: qty 27

## 2013-11-22 MED ORDER — DEXAMETHASONE SODIUM PHOSPHATE 10 MG/ML IJ SOLN
INTRAMUSCULAR | Status: AC
  Filled 2013-11-22: qty 1

## 2013-11-22 MED ORDER — SODIUM CHLORIDE 0.9 % IV SOLN
300.0000 mg/m2 | Freq: Once | INTRAVENOUS | Status: AC
  Administered 2013-11-22: 600 mg via INTRAVENOUS
  Filled 2013-11-22: qty 30

## 2013-11-22 MED ORDER — DEXAMETHASONE SODIUM PHOSPHATE 10 MG/ML IJ SOLN
10.0000 mg | Freq: Once | INTRAMUSCULAR | Status: AC
  Administered 2013-11-22: 10 mg via INTRAVENOUS

## 2013-11-22 MED ORDER — HEPARIN SOD (PORK) LOCK FLUSH 100 UNIT/ML IV SOLN
500.0000 [IU] | Freq: Once | INTRAVENOUS | Status: AC | PRN
  Administered 2013-11-22: 500 [IU]
  Filled 2013-11-22: qty 5

## 2013-11-22 MED ORDER — ONDANSETRON 8 MG/NS 50 ML IVPB
INTRAVENOUS | Status: AC
  Filled 2013-11-22: qty 8

## 2013-11-22 MED ORDER — SODIUM CHLORIDE 0.9 % IV SOLN
Freq: Once | INTRAVENOUS | Status: AC
  Administered 2013-11-22: 11:00:00 via INTRAVENOUS

## 2013-11-22 MED ORDER — ONDANSETRON 8 MG/50ML IVPB (CHCC)
8.0000 mg | Freq: Once | INTRAVENOUS | Status: AC
  Administered 2013-11-22: 8 mg via INTRAVENOUS

## 2013-11-22 MED ORDER — SODIUM CHLORIDE 0.9 % IJ SOLN
10.0000 mL | INTRAMUSCULAR | Status: DC | PRN
  Administered 2013-11-22: 10 mL
  Filled 2013-11-22: qty 10

## 2013-11-22 NOTE — Patient Instructions (Addendum)
[facility] Discharge Instructions for Patients Receiving Chemotherapy  Today you received the following chemotherapy agents: Cytoxan and Kyprolis.  To help prevent nausea and vomiting after your treatment, we encourage you to take your nausea medication: Compazine 10 mg every 6 hours as needed.   If you develop nausea and vomiting that is not controlled by your nausea medication, call the clinic.   BELOW ARE SYMPTOMS THAT SHOULD BE REPORTED IMMEDIATELY:  *FEVER GREATER THAN 100.5 F  *CHILLS WITH OR WITHOUT FEVER  NAUSEA AND VOMITING THAT IS NOT CONTROLLED WITH YOUR NAUSEA MEDICATION  *UNUSUAL SHORTNESS OF BREATH  *UNUSUAL BRUISING OR BLEEDING  TENDERNESS IN MOUTH AND THROAT WITH OR WITHOUT PRESENCE OF ULCERS  *URINARY PROBLEMS  *BOWEL PROBLEMS  UNUSUAL RASH Items with * indicate a potential emergency and should be followed up as soon as possible.  Feel free to call the clinic you have any questions or concerns. The clinic phone number is [phone].    

## 2013-11-22 NOTE — Progress Notes (Signed)
Patient refusing AVS.

## 2013-11-23 ENCOUNTER — Ambulatory Visit (HOSPITAL_BASED_OUTPATIENT_CLINIC_OR_DEPARTMENT_OTHER): Payer: Medicare Other

## 2013-11-23 DIAGNOSIS — C9000 Multiple myeloma not having achieved remission: Secondary | ICD-10-CM

## 2013-11-23 DIAGNOSIS — Z5112 Encounter for antineoplastic immunotherapy: Secondary | ICD-10-CM

## 2013-11-23 MED ORDER — SODIUM CHLORIDE 0.9 % IV SOLN
Freq: Once | INTRAVENOUS | Status: AC
  Administered 2013-11-23: 10:00:00 via INTRAVENOUS

## 2013-11-23 MED ORDER — DEXAMETHASONE SODIUM PHOSPHATE 10 MG/ML IJ SOLN
10.0000 mg | Freq: Once | INTRAMUSCULAR | Status: AC
  Administered 2013-11-23: 10 mg via INTRAVENOUS

## 2013-11-23 MED ORDER — SODIUM CHLORIDE 0.9 % IJ SOLN
10.0000 mL | INTRAMUSCULAR | Status: DC | PRN
  Administered 2013-11-23: 10 mL
  Filled 2013-11-23: qty 10

## 2013-11-23 MED ORDER — ONDANSETRON 8 MG/NS 50 ML IVPB
INTRAVENOUS | Status: AC
  Filled 2013-11-23: qty 8

## 2013-11-23 MED ORDER — ONDANSETRON 8 MG/50ML IVPB (CHCC)
8.0000 mg | Freq: Once | INTRAVENOUS | Status: AC
  Administered 2013-11-23: 8 mg via INTRAVENOUS

## 2013-11-23 MED ORDER — DEXAMETHASONE SODIUM PHOSPHATE 10 MG/ML IJ SOLN
INTRAMUSCULAR | Status: AC
  Filled 2013-11-23: qty 1

## 2013-11-23 MED ORDER — DEXTROSE 5 % IV SOLN
27.0000 mg/m2 | Freq: Once | INTRAVENOUS | Status: AC
  Administered 2013-11-23: 54 mg via INTRAVENOUS
  Filled 2013-11-23: qty 27

## 2013-11-23 MED ORDER — HEPARIN SOD (PORK) LOCK FLUSH 100 UNIT/ML IV SOLN
500.0000 [IU] | Freq: Once | INTRAVENOUS | Status: AC | PRN
  Administered 2013-11-23: 500 [IU]
  Filled 2013-11-23: qty 5

## 2013-11-23 NOTE — Patient Instructions (Signed)
[facility] Discharge Instructions for Patients Receiving Chemotherapy  Today you received the following chemotherapy agents: Carfilzomib.  To help prevent nausea and vomiting after your treatment, we encourage you to take your nausea medication, Compazine. Take one every six hours as needed.   If you develop nausea and vomiting that is not controlled by your nausea medication, call the clinic.   BELOW ARE SYMPTOMS THAT SHOULD BE REPORTED IMMEDIATELY:  *FEVER GREATER THAN 100.5 F  *CHILLS WITH OR WITHOUT FEVER  NAUSEA AND VOMITING THAT IS NOT CONTROLLED WITH YOUR NAUSEA MEDICATION  *UNUSUAL SHORTNESS OF BREATH  *UNUSUAL BRUISING OR BLEEDING  TENDERNESS IN MOUTH AND THROAT WITH OR WITHOUT PRESENCE OF ULCERS  *URINARY PROBLEMS  *BOWEL PROBLEMS  UNUSUAL RASH Items with * indicate a potential emergency and should be followed up as soon as possible.  Feel free to call the clinic should you have any questions or concerns. The clinic phone number is [phone].    

## 2013-11-29 ENCOUNTER — Ambulatory Visit (HOSPITAL_BASED_OUTPATIENT_CLINIC_OR_DEPARTMENT_OTHER): Payer: Medicare Other | Admitting: Internal Medicine

## 2013-11-29 ENCOUNTER — Ambulatory Visit (HOSPITAL_BASED_OUTPATIENT_CLINIC_OR_DEPARTMENT_OTHER): Payer: Medicare Other

## 2013-11-29 DIAGNOSIS — C9002 Multiple myeloma in relapse: Secondary | ICD-10-CM

## 2013-11-29 DIAGNOSIS — Z5112 Encounter for antineoplastic immunotherapy: Secondary | ICD-10-CM

## 2013-11-29 DIAGNOSIS — C9000 Multiple myeloma not having achieved remission: Secondary | ICD-10-CM

## 2013-11-29 LAB — CBC WITH DIFFERENTIAL/PLATELET
BASO%: 0 % (ref 0.0–2.0)
Basophils Absolute: 0 10*3/uL (ref 0.0–0.1)
EOS%: 0.3 % (ref 0.0–7.0)
Eosinophils Absolute: 0 10*3/uL (ref 0.0–0.5)
HCT: 27.6 % — ABNORMAL LOW (ref 38.4–49.9)
HGB: 8.9 g/dL — ABNORMAL LOW (ref 13.0–17.1)
LYMPH#: 0.6 10*3/uL — AB (ref 0.9–3.3)
LYMPH%: 15.3 % (ref 14.0–49.0)
MCH: 24.2 pg — AB (ref 27.2–33.4)
MCHC: 32.2 g/dL (ref 32.0–36.0)
MCV: 75 fL — AB (ref 79.3–98.0)
MONO#: 0.4 10*3/uL (ref 0.1–0.9)
MONO%: 10 % (ref 0.0–14.0)
NEUT#: 2.7 10*3/uL (ref 1.5–6.5)
NEUT%: 74.4 % (ref 39.0–75.0)
Platelets: 109 10*3/uL — ABNORMAL LOW (ref 140–400)
RBC: 3.68 10*6/uL — AB (ref 4.20–5.82)
RDW: 15 % — ABNORMAL HIGH (ref 11.0–14.6)
WBC: 3.6 10*3/uL — ABNORMAL LOW (ref 4.0–10.3)
nRBC: 0 % (ref 0–0)

## 2013-11-29 LAB — COMPREHENSIVE METABOLIC PANEL (CC13)
ALBUMIN: 3.4 g/dL — AB (ref 3.5–5.0)
ALK PHOS: 36 U/L — AB (ref 40–150)
ALT: 9 U/L (ref 0–55)
AST: 13 U/L (ref 5–34)
Anion Gap: 7 mEq/L (ref 3–11)
BILIRUBIN TOTAL: 1.21 mg/dL — AB (ref 0.20–1.20)
BUN: 10.7 mg/dL (ref 7.0–26.0)
CO2: 26 mEq/L (ref 22–29)
Calcium: 9.1 mg/dL (ref 8.4–10.4)
Chloride: 105 mEq/L (ref 98–109)
Creatinine: 1 mg/dL (ref 0.7–1.3)
GLUCOSE: 170 mg/dL — AB (ref 70–140)
POTASSIUM: 4 meq/L (ref 3.5–5.1)
SODIUM: 138 meq/L (ref 136–145)
Total Protein: 6.4 g/dL (ref 6.4–8.3)

## 2013-11-29 LAB — TECHNOLOGIST REVIEW

## 2013-11-29 MED ORDER — ONDANSETRON 8 MG/50ML IVPB (CHCC)
8.0000 mg | Freq: Once | INTRAVENOUS | Status: AC
  Administered 2013-11-29: 8 mg via INTRAVENOUS

## 2013-11-29 MED ORDER — DEXAMETHASONE SODIUM PHOSPHATE 10 MG/ML IJ SOLN
10.0000 mg | Freq: Once | INTRAMUSCULAR | Status: AC
  Administered 2013-11-29: 10 mg via INTRAVENOUS

## 2013-11-29 MED ORDER — DEXAMETHASONE SODIUM PHOSPHATE 10 MG/ML IJ SOLN
INTRAMUSCULAR | Status: AC
  Filled 2013-11-29: qty 1

## 2013-11-29 MED ORDER — DEXTROSE 5 % IV SOLN
27.0000 mg/m2 | Freq: Once | INTRAVENOUS | Status: AC
  Administered 2013-11-29: 54 mg via INTRAVENOUS
  Filled 2013-11-29: qty 27

## 2013-11-29 MED ORDER — SODIUM CHLORIDE 0.9 % IV SOLN
Freq: Once | INTRAVENOUS | Status: AC
  Administered 2013-11-29: 11:00:00 via INTRAVENOUS

## 2013-11-29 MED ORDER — ONDANSETRON 8 MG/NS 50 ML IVPB
INTRAVENOUS | Status: AC
  Filled 2013-11-29: qty 8

## 2013-11-29 MED ORDER — HEPARIN SOD (PORK) LOCK FLUSH 100 UNIT/ML IV SOLN
500.0000 [IU] | Freq: Once | INTRAVENOUS | Status: AC | PRN
  Administered 2013-11-29: 500 [IU]
  Filled 2013-11-29: qty 5

## 2013-11-29 MED ORDER — SODIUM CHLORIDE 0.9 % IV SOLN: Freq: Once | INTRAVENOUS | Status: DC

## 2013-11-29 MED ORDER — SODIUM CHLORIDE 0.9 % IV SOLN
300.0000 mg/m2 | Freq: Once | INTRAVENOUS | Status: AC
  Administered 2013-11-29: 600 mg via INTRAVENOUS
  Filled 2013-11-29: qty 30

## 2013-11-29 MED ORDER — SODIUM CHLORIDE 0.9 % IJ SOLN
10.0000 mL | INTRAMUSCULAR | Status: DC | PRN
  Administered 2013-11-29: 10 mL
  Filled 2013-11-29: qty 10

## 2013-11-29 NOTE — Patient Instructions (Signed)
[place] Discharge Instructions for Patients Receiving Chemotherapy  Today you received the following chemotherapy agents cytoxan, kyprolis  To help prevent nausea and vomiting after your treatment, we encourage you to take your nausea medication as needed   If you develop nausea and vomiting that is not controlled by your nausea medication, call the clinic.   BELOW ARE SYMPTOMS THAT SHOULD BE REPORTED IMMEDIATELY:  *FEVER GREATER THAN 100.5 F  *CHILLS WITH OR WITHOUT FEVER  NAUSEA AND VOMITING THAT IS NOT CONTROLLED WITH YOUR NAUSEA MEDICATION  *UNUSUAL SHORTNESS OF BREATH  *UNUSUAL BRUISING OR BLEEDING  TENDERNESS IN MOUTH AND THROAT WITH OR WITHOUT PRESENCE OF ULCERS  *URINARY PROBLEMS  *BOWEL PROBLEMS  UNUSUAL RASH Items with * indicate a potential emergency and should be followed up as soon as possible.  Feel free to call the clinic you have any questions or concerns. The clinic phone number is [phone].

## 2013-11-29 NOTE — Progress Notes (Signed)
Per Dr Vista Mink, okay to use CMET from 11/3 for tx today.

## 2013-11-30 ENCOUNTER — Ambulatory Visit (HOSPITAL_BASED_OUTPATIENT_CLINIC_OR_DEPARTMENT_OTHER): Payer: Medicare Other

## 2013-11-30 DIAGNOSIS — Z5112 Encounter for antineoplastic immunotherapy: Secondary | ICD-10-CM

## 2013-11-30 DIAGNOSIS — C9000 Multiple myeloma not having achieved remission: Secondary | ICD-10-CM

## 2013-11-30 MED ORDER — ONDANSETRON 8 MG/NS 50 ML IVPB
INTRAVENOUS | Status: AC
  Filled 2013-11-30: qty 8

## 2013-11-30 MED ORDER — HEPARIN SOD (PORK) LOCK FLUSH 100 UNIT/ML IV SOLN
500.0000 [IU] | Freq: Once | INTRAVENOUS | Status: AC | PRN
  Administered 2013-11-30: 500 [IU]
  Filled 2013-11-30: qty 5

## 2013-11-30 MED ORDER — ONDANSETRON 8 MG/50ML IVPB (CHCC)
8.0000 mg | Freq: Once | INTRAVENOUS | Status: AC
  Administered 2013-11-30: 8 mg via INTRAVENOUS

## 2013-11-30 MED ORDER — DEXTROSE 5 % IV SOLN
27.0000 mg/m2 | Freq: Once | INTRAVENOUS | Status: AC
  Administered 2013-11-30: 54 mg via INTRAVENOUS
  Filled 2013-11-30: qty 27

## 2013-11-30 MED ORDER — DEXAMETHASONE SODIUM PHOSPHATE 10 MG/ML IJ SOLN
INTRAMUSCULAR | Status: AC
  Filled 2013-11-30: qty 1

## 2013-11-30 MED ORDER — SODIUM CHLORIDE 0.9 % IV SOLN
Freq: Once | INTRAVENOUS | Status: AC
  Administered 2013-11-30: 11:00:00 via INTRAVENOUS

## 2013-11-30 MED ORDER — SODIUM CHLORIDE 0.9 % IJ SOLN
10.0000 mL | INTRAMUSCULAR | Status: DC | PRN
  Administered 2013-11-30: 10 mL
  Filled 2013-11-30: qty 10

## 2013-11-30 MED ORDER — DEXAMETHASONE SODIUM PHOSPHATE 10 MG/ML IJ SOLN
10.0000 mg | Freq: Once | INTRAMUSCULAR | Status: AC
  Administered 2013-11-30: 10 mg via INTRAVENOUS

## 2013-11-30 NOTE — Progress Notes (Signed)
This encounter was created in error - please disregard.

## 2013-12-05 ENCOUNTER — Other Ambulatory Visit: Payer: Self-pay | Admitting: Medical Oncology

## 2013-12-05 DIAGNOSIS — C9000 Multiple myeloma not having achieved remission: Secondary | ICD-10-CM

## 2013-12-06 ENCOUNTER — Other Ambulatory Visit (HOSPITAL_BASED_OUTPATIENT_CLINIC_OR_DEPARTMENT_OTHER): Payer: Medicare Other

## 2013-12-06 DIAGNOSIS — C9000 Multiple myeloma not having achieved remission: Secondary | ICD-10-CM

## 2013-12-06 LAB — COMPREHENSIVE METABOLIC PANEL (CC13)
ALBUMIN: 3.5 g/dL (ref 3.5–5.0)
ALK PHOS: 37 U/L — AB (ref 40–150)
ALT: 12 U/L (ref 0–55)
ANION GAP: 7 meq/L (ref 3–11)
AST: 15 U/L (ref 5–34)
BUN: 10.5 mg/dL (ref 7.0–26.0)
CO2: 25 meq/L (ref 22–29)
Calcium: 9.4 mg/dL (ref 8.4–10.4)
Chloride: 107 mEq/L (ref 98–109)
Creatinine: 1 mg/dL (ref 0.7–1.3)
GLUCOSE: 181 mg/dL — AB (ref 70–140)
POTASSIUM: 4.1 meq/L (ref 3.5–5.1)
SODIUM: 139 meq/L (ref 136–145)
TOTAL PROTEIN: 6.5 g/dL (ref 6.4–8.3)
Total Bilirubin: 1.25 mg/dL — ABNORMAL HIGH (ref 0.20–1.20)

## 2013-12-06 LAB — CBC WITH DIFFERENTIAL/PLATELET
BASO%: 0.8 % (ref 0.0–2.0)
Basophils Absolute: 0 10*3/uL (ref 0.0–0.1)
EOS%: 0.6 % (ref 0.0–7.0)
Eosinophils Absolute: 0 10*3/uL (ref 0.0–0.5)
HCT: 28.1 % — ABNORMAL LOW (ref 38.4–49.9)
HGB: 8.9 g/dL — ABNORMAL LOW (ref 13.0–17.1)
LYMPH#: 0.5 10*3/uL — AB (ref 0.9–3.3)
LYMPH%: 14.9 % (ref 14.0–49.0)
MCH: 24.3 pg — ABNORMAL LOW (ref 27.2–33.4)
MCHC: 31.5 g/dL — AB (ref 32.0–36.0)
MCV: 76.9 fL — AB (ref 79.3–98.0)
MONO#: 0.3 10*3/uL (ref 0.1–0.9)
MONO%: 10.3 % (ref 0.0–14.0)
NEUT#: 2.4 10*3/uL (ref 1.5–6.5)
NEUT%: 73.4 % (ref 39.0–75.0)
Platelets: 138 10*3/uL — ABNORMAL LOW (ref 140–400)
RBC: 3.66 10*6/uL — ABNORMAL LOW (ref 4.20–5.82)
RDW: 15.4 % — ABNORMAL HIGH (ref 11.0–14.6)
WBC: 3.2 10*3/uL — ABNORMAL LOW (ref 4.0–10.3)

## 2013-12-12 ENCOUNTER — Other Ambulatory Visit: Payer: Self-pay | Admitting: *Deleted

## 2013-12-12 DIAGNOSIS — C9000 Multiple myeloma not having achieved remission: Secondary | ICD-10-CM

## 2013-12-13 ENCOUNTER — Encounter: Payer: Self-pay | Admitting: Internal Medicine

## 2013-12-13 ENCOUNTER — Telehealth: Payer: Self-pay | Admitting: Internal Medicine

## 2013-12-13 ENCOUNTER — Ambulatory Visit (HOSPITAL_BASED_OUTPATIENT_CLINIC_OR_DEPARTMENT_OTHER): Payer: Medicare Other | Admitting: Internal Medicine

## 2013-12-13 ENCOUNTER — Other Ambulatory Visit (HOSPITAL_BASED_OUTPATIENT_CLINIC_OR_DEPARTMENT_OTHER): Payer: Medicare Other

## 2013-12-13 ENCOUNTER — Ambulatory Visit (HOSPITAL_BASED_OUTPATIENT_CLINIC_OR_DEPARTMENT_OTHER): Payer: Medicare Other

## 2013-12-13 VITALS — BP 136/73 | HR 72 | Temp 97.7°F | Resp 18 | Ht 71.0 in | Wt 175.2 lb

## 2013-12-13 DIAGNOSIS — C9000 Multiple myeloma not having achieved remission: Secondary | ICD-10-CM

## 2013-12-13 DIAGNOSIS — Z5111 Encounter for antineoplastic chemotherapy: Secondary | ICD-10-CM

## 2013-12-13 DIAGNOSIS — Z5112 Encounter for antineoplastic immunotherapy: Secondary | ICD-10-CM

## 2013-12-13 LAB — CBC WITH DIFFERENTIAL/PLATELET
BASO%: 0.6 % (ref 0.0–2.0)
Basophils Absolute: 0 10*3/uL (ref 0.0–0.1)
EOS%: 1 % (ref 0.0–7.0)
Eosinophils Absolute: 0 10*3/uL (ref 0.0–0.5)
HEMATOCRIT: 30 % — AB (ref 38.4–49.9)
HGB: 9.2 g/dL — ABNORMAL LOW (ref 13.0–17.1)
LYMPH%: 20 % (ref 14.0–49.0)
MCH: 24.2 pg — ABNORMAL LOW (ref 27.2–33.4)
MCHC: 30.8 g/dL — AB (ref 32.0–36.0)
MCV: 78.5 fL — ABNORMAL LOW (ref 79.3–98.0)
MONO#: 0.4 10*3/uL (ref 0.1–0.9)
MONO%: 13.3 % (ref 0.0–14.0)
NEUT#: 1.8 10*3/uL (ref 1.5–6.5)
NEUT%: 65.1 % (ref 39.0–75.0)
PLATELETS: 166 10*3/uL (ref 140–400)
RBC: 3.82 10*6/uL — ABNORMAL LOW (ref 4.20–5.82)
RDW: 15.9 % — ABNORMAL HIGH (ref 11.0–14.6)
WBC: 2.7 10*3/uL — ABNORMAL LOW (ref 4.0–10.3)
lymph#: 0.5 10*3/uL — ABNORMAL LOW (ref 0.9–3.3)

## 2013-12-13 LAB — COMPREHENSIVE METABOLIC PANEL (CC13)
ALK PHOS: 40 U/L (ref 40–150)
ALT: 11 U/L (ref 0–55)
AST: 15 U/L (ref 5–34)
Albumin: 3.5 g/dL (ref 3.5–5.0)
Anion Gap: 7 mEq/L (ref 3–11)
BUN: 10.4 mg/dL (ref 7.0–26.0)
CO2: 26 mEq/L (ref 22–29)
CREATININE: 1 mg/dL (ref 0.7–1.3)
Calcium: 9.1 mg/dL (ref 8.4–10.4)
Chloride: 104 mEq/L (ref 98–109)
Glucose: 174 mg/dl — ABNORMAL HIGH (ref 70–140)
Potassium: 4.1 mEq/L (ref 3.5–5.1)
SODIUM: 138 meq/L (ref 136–145)
TOTAL PROTEIN: 6.7 g/dL (ref 6.4–8.3)
Total Bilirubin: 1.29 mg/dL — ABNORMAL HIGH (ref 0.20–1.20)

## 2013-12-13 MED ORDER — HEPARIN SOD (PORK) LOCK FLUSH 100 UNIT/ML IV SOLN
500.0000 [IU] | Freq: Once | INTRAVENOUS | Status: AC | PRN
  Administered 2013-12-13: 500 [IU]
  Filled 2013-12-13: qty 5

## 2013-12-13 MED ORDER — SODIUM CHLORIDE 0.9 % IJ SOLN
10.0000 mL | INTRAMUSCULAR | Status: DC | PRN
  Administered 2013-12-13: 10 mL
  Filled 2013-12-13: qty 10

## 2013-12-13 MED ORDER — DEXTROSE 5 % IV SOLN
27.0000 mg/m2 | Freq: Once | INTRAVENOUS | Status: AC
  Administered 2013-12-13: 54 mg via INTRAVENOUS
  Filled 2013-12-13: qty 27

## 2013-12-13 MED ORDER — DEXAMETHASONE SODIUM PHOSPHATE 10 MG/ML IJ SOLN
INTRAMUSCULAR | Status: AC
  Filled 2013-12-13: qty 1

## 2013-12-13 MED ORDER — SODIUM CHLORIDE 0.9 % IV SOLN
Freq: Once | INTRAVENOUS | Status: AC
  Administered 2013-12-13: 12:00:00 via INTRAVENOUS

## 2013-12-13 MED ORDER — SODIUM CHLORIDE 0.9 % IV SOLN
300.0000 mg/m2 | Freq: Once | INTRAVENOUS | Status: AC
  Administered 2013-12-13: 600 mg via INTRAVENOUS
  Filled 2013-12-13: qty 30

## 2013-12-13 MED ORDER — ONDANSETRON 8 MG/50ML IVPB (CHCC)
8.0000 mg | Freq: Once | INTRAVENOUS | Status: AC
Start: 2013-12-13 — End: 2013-12-13
  Administered 2013-12-13: 8 mg via INTRAVENOUS

## 2013-12-13 MED ORDER — ONDANSETRON 8 MG/NS 50 ML IVPB
INTRAVENOUS | Status: AC
  Filled 2013-12-13: qty 8

## 2013-12-13 MED ORDER — DEXAMETHASONE SODIUM PHOSPHATE 10 MG/ML IJ SOLN
10.0000 mg | Freq: Once | INTRAMUSCULAR | Status: AC
  Administered 2013-12-13: 10 mg via INTRAVENOUS

## 2013-12-13 NOTE — Telephone Encounter (Signed)
Gave avs & cal for Nov/Dec. Sent mess to sch tx °

## 2013-12-13 NOTE — Progress Notes (Signed)
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   PRIOR THERAPY: :  1. Status post 6 cycles of systemic chemotherapy with Revlimid and Decadron, last dose was given 07/21/2010 with very good response. 2. 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 [name].  3. maintenance Revlimid at 10 mg by mouth daily status post 2 months. Therapy began 01/18/2011. 4. maintenance Revlimid at 15 mg by mouth daily with prophylactic dose Coumadin at 2 mg by mouth daily. 5. Systemic chemotherapy with Velcade at 1.3 mg per meter squared given on days 1, 4, 8 and 11 and Doxil at 30 mg per meter square given on day 4 and Decadron 40 mg by mouth on weekly basis given every 3 weeks. Status post 4 cycles. 6. Zometa 4 mg IV every 4 weeks.  7. Velcade 1.3 mg/M2 subcutaneous daily on a weekly basis with Decadron 20 mg by mouth on a weekly basis. First cycle expected on 06/21/2012. S/P 4 cycles.  CURRENT THERAPY:  1) Systemic chemotherapy with Carfilzomib, cyclophosphamide and Decadron. First cycle started on 08/09/2012. He is status post 15 cycles. 2) Zometa every 8 weeks. Next dose 05/27/2013.   INTERVAL HISTORY: Eddie Thomas 68 y.o. male returns to the clinic today for followup visit. He is currently on treatment with Carfilzomib, Cytoxan and dexamethasone status post 15 cycles and tolerating his treatment fairly well. The patient is feeling fine with no specific complaints except tenderness in the left breast but no palpable mass in that area. He denied having any significant weight loss or night sweats. He has no fever or chills. He has no nausea or vomiting. The patient denied having any significant chest pain, shortness of breath, cough or hemoptysis. He  is here today to start cycle #16.  MEDICAL HISTORY: Past Medical History  Diagnosis Date  . Multiple myeloma   . Diabetes mellitus 07/03/2011  . Hypertension 07/03/2011  . Hyperlipidemia 07/03/2011  . Colon polyps 2012  . Gastric ulcer     ALLERGIES:  has No Known Allergies.  MEDICATIONS:  Current Outpatient Prescriptions  Medication Sig Dispense Refill  . Cholecalciferol (VITAMIN D-3) 5000 UNITS TABS Take 1 tablet by mouth daily.    [name] lidocaine-prilocaine (EMLA) cream APPLY CREAM TOPICALLY TO AFFECTED AREA AS DIRECTED 30 g 1  . lisinopril (PRINIVIL,ZESTRIL) 10 MG tablet Take 5 mg by mouth every evening.     . pantoprazole (PROTONIX) 40 MG tablet Take 1 tablet (40 mg total) by mouth 2 (two) times daily. 60 tablet 11  . pioglitazone (ACTOS) 15 MG tablet Take 15 mg by mouth daily.    . prochlorperazine (COMPAZINE) 10 MG tablet Take 10 mg by mouth every 6 (six) hours as needed for nausea or vomiting.    . simvastatin (ZOCOR) 10 MG tablet Take 10 mg by mouth at bedtime.      . temazepam (RESTORIL) 15 MG capsule Take 15 mg by mouth at bedtime as needed for sleep.    . valACYclovir (VALTREX) 500 MG tablet TAKE ONE TABLET BY MOUTH ONCE DAILY 30 tablet 3  . VIAGRA 50 MG tablet Take 50 mg by mouth daily as needed for erectile dysfunction.     [name] oxyCODONE-acetaminophen (PERCOCET/ROXICET) 5-325 MG per tablet Take 1 tablet by mouth every  6 (six) hours as needed for severe pain. 30 tablet 0   No current facility-administered medications for this visit.    SURGICAL HISTORY:  Past Surgical History  Procedure Laterality Date  . Limbal stem cell transplant    . Humerus fracture surgery      right  . 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    REVIEW OF SYSTEMS:  Constitutional: negative Eyes: negative Ears, nose, mouth, throat, and face: negative Respiratory: negative Cardiovascular: negative Gastrointestinal: negative Genitourinary:negative Integument/breast:  negative Hematologic/lymphatic: negative Musculoskeletal:negative Neurological: negative Behavioral/Psych: negative Endocrine: negative Allergic/Immunologic: negative   PHYSICAL EXAMINATION: General appearance: alert, cooperative and no distress Head: Normocephalic, without obvious abnormality, atraumatic Neck: no adenopathy, no JVD, supple, symmetrical, trachea midline and thyroid not enlarged, symmetric, no tenderness/mass/nodules Lymph nodes: Cervical, supraclavicular, and axillary nodes normal. Resp: clear to auscultation bilaterally Back: symmetric, no curvature. ROM normal. No CVA tenderness. Cardio: regular rate and rhythm, S1, S2 normal, no murmur, click, rub or gallop GI: soft, non-tender; bowel sounds normal; no masses,  no organomegaly Extremities: extremities normal, atraumatic, no cyanosis or edema Neurologic: Alert and oriented X 3, normal strength and tone. Normal symmetric reflexes. Normal coordination and gait  ECOG PERFORMANCE STATUS: 1 - Symptomatic but completely ambulatory  Blood pressure 136/73, pulse 72, temperature 97.7 F (36.5 C), temperature source Oral, resp. rate 18, height $RemoveBe'5\' 11"'iMAhwsvpg$  (1.803 m), weight 175 lb 3.2 oz (79.47 kg), SpO2 100 %.  LABORATORY DATA: Lab Results  Component Value Date   WBC 2.7* 12/13/2013   HGB 9.2* 12/13/2013   HCT 30.0* 12/13/2013   MCV 78.5* 12/13/2013   PLT 166 12/13/2013      Chemistry      Component Value Date/Time   NA 138 12/13/2013 1023   NA 139 08/30/2013 0935   K 4.1 12/13/2013 1023   K 4.1 08/30/2013 0935   CL 101 08/30/2013 0935   CL 103 07/12/2012 0907   CO2 26 12/13/2013 1023   CO2 26 08/30/2013 0935   BUN 10.4 12/13/2013 1023   BUN 11 08/30/2013 0935   CREATININE 1.0 12/13/2013 1023   CREATININE 1.03 08/30/2013 0935      Component Value Date/Time   CALCIUM 9.1 12/13/2013 1023   CALCIUM 9.2 08/30/2013 0935   ALKPHOS 40 12/13/2013 1023   ALKPHOS 39 08/30/2013 0935   AST 15 12/13/2013 1023   AST 13  08/30/2013 0935   ALT 11 12/13/2013 1023   ALT 9 08/30/2013 0935   BILITOT 1.29* 12/13/2013 1023   BILITOT 1.3* 08/30/2013 0935     Other lab results:   RADIOGRAPHIC STUDIES: No results found.  ASSESSMENT AND PLAN: This is a very pleasant 68 years old [place] American male with history of multiple myeloma currently undergoing systemic chemotherapy with Carfilzomib, cyclophosphamide and Decadron status post 15 cycles.  The patient is feeling fine and tolerating his treatment fairly well He will start cycle #16 today. He would come back for followup visit in 1 month for reevaluation after repeating myeloma panel. He'll continue on Zometa every 2 months. He was advised to call immediately if he has any concerning symptoms in the interval.  The patient voices understanding of current disease status and treatment options and is in agreement with the current care plan. All questions were answered. The patient knows to call the clinic with any problems, questions or concerns. We can certainly see the patient much sooner if necessary.  Disclaimer: This note was dictated with voice  recognition software. Similar sounding words can inadvertently be transcribed and may not be corrected upon review.

## 2013-12-13 NOTE — Patient Instructions (Signed)
[facility] Discharge Instructions for Patients Receiving Chemotherapy  Today you received the following chemotherapy agents Kyprolis/Cytoxan.   To help prevent nausea and vomiting after your treatment, we encourage you to take your nausea medication as directed.    If you develop nausea and vomiting that is not controlled by your nausea medication, call the clinic.   BELOW ARE SYMPTOMS THAT SHOULD BE REPORTED IMMEDIATELY:  *FEVER GREATER THAN 100.5 F  *CHILLS WITH OR WITHOUT FEVER  NAUSEA AND VOMITING THAT IS NOT CONTROLLED WITH YOUR NAUSEA MEDICATION  *UNUSUAL SHORTNESS OF BREATH  *UNUSUAL BRUISING OR BLEEDING  TENDERNESS IN MOUTH AND THROAT WITH OR WITHOUT PRESENCE OF ULCERS  *URINARY PROBLEMS  *BOWEL PROBLEMS  UNUSUAL RASH Items with * indicate a potential emergency and should be followed up as soon as possible.  Feel free to call the clinic you have any questions or concerns. The clinic phone number is [phone].    

## 2013-12-14 ENCOUNTER — Telehealth: Payer: Self-pay | Admitting: *Deleted

## 2013-12-14 ENCOUNTER — Telehealth: Payer: Self-pay | Admitting: Internal Medicine

## 2013-12-14 ENCOUNTER — Ambulatory Visit (HOSPITAL_BASED_OUTPATIENT_CLINIC_OR_DEPARTMENT_OTHER): Payer: Medicare Other

## 2013-12-14 DIAGNOSIS — Z5112 Encounter for antineoplastic immunotherapy: Secondary | ICD-10-CM

## 2013-12-14 DIAGNOSIS — C9000 Multiple myeloma not having achieved remission: Secondary | ICD-10-CM

## 2013-12-14 MED ORDER — DEXAMETHASONE SODIUM PHOSPHATE 10 MG/ML IJ SOLN
INTRAMUSCULAR | Status: AC
  Filled 2013-12-14: qty 1

## 2013-12-14 MED ORDER — SODIUM CHLORIDE 0.9 % IJ SOLN
10.0000 mL | INTRAMUSCULAR | Status: DC | PRN
  Administered 2013-12-14: 10 mL
  Filled 2013-12-14: qty 10

## 2013-12-14 MED ORDER — DEXTROSE 5 % IV SOLN
27.0000 mg/m2 | Freq: Once | INTRAVENOUS | Status: AC
  Administered 2013-12-14: 54 mg via INTRAVENOUS
  Filled 2013-12-14: qty 27

## 2013-12-14 MED ORDER — ONDANSETRON 8 MG/50ML IVPB (CHCC)
8.0000 mg | Freq: Once | INTRAVENOUS | Status: AC
  Administered 2013-12-14: 8 mg via INTRAVENOUS

## 2013-12-14 MED ORDER — DEXAMETHASONE SODIUM PHOSPHATE 10 MG/ML IJ SOLN
10.0000 mg | Freq: Once | INTRAMUSCULAR | Status: AC
  Administered 2013-12-14: 10 mg via INTRAVENOUS

## 2013-12-14 MED ORDER — SODIUM CHLORIDE 0.9 % IV SOLN
Freq: Once | INTRAVENOUS | Status: AC
  Administered 2013-12-14: 08:00:00 via INTRAVENOUS

## 2013-12-14 MED ORDER — HEPARIN SOD (PORK) LOCK FLUSH 100 UNIT/ML IV SOLN
500.0000 [IU] | Freq: Once | INTRAVENOUS | Status: AC | PRN
  Administered 2013-12-14: 500 [IU]
  Filled 2013-12-14: qty 5

## 2013-12-14 NOTE — Telephone Encounter (Signed)
Labs added to treatment dates 12/01 & 12/08 per 11/25 POF.... Sent msg to chemo room to give pt updated sch w/labs added .... KJ

## 2013-12-14 NOTE — Patient Instructions (Signed)
[facility] Discharge Instructions for Patients Receiving Chemotherapy  Today you received the following chemotherapy agents: Kyprolis  To help prevent nausea and vomiting after your treatment, we encourage you to take your nausea medication as prescribed by your physician.   If you develop nausea and vomiting that is not controlled by your nausea medication, call the clinic.   BELOW ARE SYMPTOMS THAT SHOULD BE REPORTED IMMEDIATELY:  *FEVER GREATER THAN 100.5 F  *CHILLS WITH OR WITHOUT FEVER  NAUSEA AND VOMITING THAT IS NOT CONTROLLED WITH YOUR NAUSEA MEDICATION  *UNUSUAL SHORTNESS OF BREATH  *UNUSUAL BRUISING OR BLEEDING  TENDERNESS IN MOUTH AND THROAT WITH OR WITHOUT PRESENCE OF ULCERS  *URINARY PROBLEMS  *BOWEL PROBLEMS  UNUSUAL RASH Items with * indicate a potential emergency and should be followed up as soon as possible.  Feel free to call the clinic you have any questions or concerns. The clinic phone number is [phone].    

## 2013-12-14 NOTE — Telephone Encounter (Signed)
Per staff message and POF I have scheduled appts. Advised scheduler of appts. JMW  

## 2013-12-19 ENCOUNTER — Other Ambulatory Visit: Payer: Self-pay | Admitting: *Deleted

## 2013-12-19 DIAGNOSIS — C9000 Multiple myeloma not having achieved remission: Secondary | ICD-10-CM

## 2013-12-20 ENCOUNTER — Other Ambulatory Visit (HOSPITAL_BASED_OUTPATIENT_CLINIC_OR_DEPARTMENT_OTHER): Payer: Medicare Other

## 2013-12-20 ENCOUNTER — Ambulatory Visit (HOSPITAL_BASED_OUTPATIENT_CLINIC_OR_DEPARTMENT_OTHER): Payer: Medicare Other

## 2013-12-20 DIAGNOSIS — Z5111 Encounter for antineoplastic chemotherapy: Secondary | ICD-10-CM

## 2013-12-20 DIAGNOSIS — C9000 Multiple myeloma not having achieved remission: Secondary | ICD-10-CM

## 2013-12-20 DIAGNOSIS — Z5112 Encounter for antineoplastic immunotherapy: Secondary | ICD-10-CM

## 2013-12-20 LAB — COMPREHENSIVE METABOLIC PANEL (CC13)
ALBUMIN: 3.6 g/dL (ref 3.5–5.0)
ALT: 10 U/L (ref 0–55)
AST: 15 U/L (ref 5–34)
Alkaline Phosphatase: 41 U/L (ref 40–150)
Anion Gap: 9 mEq/L (ref 3–11)
BUN: 10.9 mg/dL (ref 7.0–26.0)
CALCIUM: 9.4 mg/dL (ref 8.4–10.4)
CHLORIDE: 105 meq/L (ref 98–109)
CO2: 25 mEq/L (ref 22–29)
Creatinine: 1.1 mg/dL (ref 0.7–1.3)
GLUCOSE: 159 mg/dL — AB (ref 70–140)
POTASSIUM: 4 meq/L (ref 3.5–5.1)
Sodium: 139 mEq/L (ref 136–145)
Total Bilirubin: 1.44 mg/dL — ABNORMAL HIGH (ref 0.20–1.20)
Total Protein: 6.8 g/dL (ref 6.4–8.3)

## 2013-12-20 LAB — CBC WITH DIFFERENTIAL/PLATELET
BASO%: 0.3 % (ref 0.0–2.0)
BASOS ABS: 0 10*3/uL (ref 0.0–0.1)
EOS%: 0.9 % (ref 0.0–7.0)
Eosinophils Absolute: 0 10*3/uL (ref 0.0–0.5)
HCT: 31.5 % — ABNORMAL LOW (ref 38.4–49.9)
HEMOGLOBIN: 9.6 g/dL — AB (ref 13.0–17.1)
LYMPH#: 0.6 10*3/uL — AB (ref 0.9–3.3)
LYMPH%: 19.8 % (ref 14.0–49.0)
MCH: 23.8 pg — AB (ref 27.2–33.4)
MCHC: 30.5 g/dL — ABNORMAL LOW (ref 32.0–36.0)
MCV: 78 fL — ABNORMAL LOW (ref 79.3–98.0)
MONO#: 0.4 10*3/uL (ref 0.1–0.9)
MONO%: 12.5 % (ref 0.0–14.0)
NEUT%: 66.5 % (ref 39.0–75.0)
NEUTROS ABS: 2 10*3/uL (ref 1.5–6.5)
Platelets: 134 10*3/uL — ABNORMAL LOW (ref 140–400)
RBC: 4.04 10*6/uL — ABNORMAL LOW (ref 4.20–5.82)
RDW: 15.5 % — AB (ref 11.0–14.6)
WBC: 3 10*3/uL — AB (ref 4.0–10.3)

## 2013-12-20 LAB — LACTATE DEHYDROGENASE (CC13): LDH: 169 U/L (ref 125–245)

## 2013-12-20 MED ORDER — CARFILZOMIB CHEMO INJECTION 60 MG
27.0000 mg/m2 | Freq: Once | INTRAVENOUS | Status: AC
  Administered 2013-12-20: 54 mg via INTRAVENOUS
  Filled 2013-12-20: qty 27

## 2013-12-20 MED ORDER — DEXAMETHASONE SODIUM PHOSPHATE 10 MG/ML IJ SOLN
10.0000 mg | Freq: Once | INTRAMUSCULAR | Status: AC
  Administered 2013-12-20: 10 mg via INTRAVENOUS

## 2013-12-20 MED ORDER — ONDANSETRON 8 MG/50ML IVPB (CHCC)
8.0000 mg | Freq: Once | INTRAVENOUS | Status: AC
  Administered 2013-12-20: 8 mg via INTRAVENOUS

## 2013-12-20 MED ORDER — SODIUM CHLORIDE 0.9 % IV SOLN
Freq: Once | INTRAVENOUS | Status: AC
  Administered 2013-12-20: 11:00:00 via INTRAVENOUS

## 2013-12-20 MED ORDER — HEPARIN SOD (PORK) LOCK FLUSH 100 UNIT/ML IV SOLN
500.0000 [IU] | Freq: Once | INTRAVENOUS | Status: AC | PRN
  Administered 2013-12-20: 500 [IU]
  Filled 2013-12-20: qty 5

## 2013-12-20 MED ORDER — ONDANSETRON 8 MG/NS 50 ML IVPB
INTRAVENOUS | Status: AC
  Filled 2013-12-20: qty 8

## 2013-12-20 MED ORDER — SODIUM CHLORIDE 0.9 % IV SOLN: Freq: Once | INTRAVENOUS | Status: DC

## 2013-12-20 MED ORDER — SODIUM CHLORIDE 0.9 % IJ SOLN
10.0000 mL | INTRAMUSCULAR | Status: DC | PRN
  Administered 2013-12-20: 10 mL
  Filled 2013-12-20: qty 10

## 2013-12-20 MED ORDER — CYCLOPHOSPHAMIDE CHEMO INJECTION 1 GM
300.0000 mg/m2 | Freq: Once | INTRAMUSCULAR | Status: AC
  Administered 2013-12-20: 600 mg via INTRAVENOUS
  Filled 2013-12-20: qty 30

## 2013-12-20 NOTE — Patient Instructions (Signed)
[place] Discharge Instructions for Patients Receiving Chemotherapy  Today you received the following chemotherapy agents: Cytoxan, Kyprolis.  To help prevent nausea and vomiting after your treatment, we encourage you to take your nausea medication as prescribed.   If you develop nausea and vomiting that is not controlled by your nausea medication, call the clinic.   BELOW ARE SYMPTOMS THAT SHOULD BE REPORTED IMMEDIATELY:  *FEVER GREATER THAN 100.5 F  *CHILLS WITH OR WITHOUT FEVER  NAUSEA AND VOMITING THAT IS NOT CONTROLLED WITH YOUR NAUSEA MEDICATION  *UNUSUAL SHORTNESS OF BREATH  *UNUSUAL BRUISING OR BLEEDING  TENDERNESS IN MOUTH AND THROAT WITH OR WITHOUT PRESENCE OF ULCERS  *URINARY PROBLEMS  *BOWEL PROBLEMS  UNUSUAL RASH Items with * indicate a potential emergency and should be followed up as soon as possible.  Feel free to call the clinic you have any questions or concerns. The clinic phone number is [phone].

## 2013-12-21 ENCOUNTER — Ambulatory Visit (HOSPITAL_BASED_OUTPATIENT_CLINIC_OR_DEPARTMENT_OTHER): Payer: Medicare Other

## 2013-12-21 DIAGNOSIS — Z5112 Encounter for antineoplastic immunotherapy: Secondary | ICD-10-CM

## 2013-12-21 DIAGNOSIS — C9000 Multiple myeloma not having achieved remission: Secondary | ICD-10-CM

## 2013-12-21 MED ORDER — HEPARIN SOD (PORK) LOCK FLUSH 100 UNIT/ML IV SOLN
500.0000 [IU] | Freq: Once | INTRAVENOUS | Status: AC | PRN
  Administered 2013-12-21: 500 [IU]
  Filled 2013-12-21: qty 5

## 2013-12-21 MED ORDER — DEXAMETHASONE SODIUM PHOSPHATE 10 MG/ML IJ SOLN
10.0000 mg | Freq: Once | INTRAMUSCULAR | Status: AC
  Administered 2013-12-21: 10 mg via INTRAVENOUS

## 2013-12-21 MED ORDER — DEXTROSE 5 % IV SOLN
27.0000 mg/m2 | Freq: Once | INTRAVENOUS | Status: AC
  Administered 2013-12-21: 54 mg via INTRAVENOUS
  Filled 2013-12-21: qty 27

## 2013-12-21 MED ORDER — ONDANSETRON 8 MG/50ML IVPB (CHCC)
8.0000 mg | Freq: Once | INTRAVENOUS | Status: AC
  Administered 2013-12-21: 8 mg via INTRAVENOUS

## 2013-12-21 MED ORDER — ONDANSETRON 8 MG/NS 50 ML IVPB
INTRAVENOUS | Status: AC
  Filled 2013-12-21: qty 8

## 2013-12-21 MED ORDER — DEXAMETHASONE SODIUM PHOSPHATE 10 MG/ML IJ SOLN
INTRAMUSCULAR | Status: AC
Start: 2013-12-21 — End: 2013-12-21
  Filled 2013-12-21: qty 1

## 2013-12-21 MED ORDER — SODIUM CHLORIDE 0.9 % IJ SOLN
10.0000 mL | INTRAMUSCULAR | Status: DC | PRN
  Administered 2013-12-21: 10 mL
  Filled 2013-12-21: qty 10

## 2013-12-21 MED ORDER — SODIUM CHLORIDE 0.9 % IV SOLN
Freq: Once | INTRAVENOUS | Status: AC
  Administered 2013-12-21: 09:00:00 via INTRAVENOUS

## 2013-12-21 NOTE — Patient Instructions (Signed)
[facility] Discharge Instructions for Patients Receiving Chemotherapy  Today you received the following chemotherapy agents Kyprolis  To help prevent nausea and vomiting after your treatment, we encourage you to take your nausea medication Compazine 10 mg every 6 hours as needed. If you develop nausea and vomiting that is not controlled by your nausea medication, call the clinic.   BELOW ARE SYMPTOMS THAT SHOULD BE REPORTED IMMEDIATELY:  *FEVER GREATER THAN 100.5 F  *CHILLS WITH OR WITHOUT FEVER  NAUSEA AND VOMITING THAT IS NOT CONTROLLED WITH YOUR NAUSEA MEDICATION  *UNUSUAL SHORTNESS OF BREATH  *UNUSUAL BRUISING OR BLEEDING  TENDERNESS IN MOUTH AND THROAT WITH OR WITHOUT PRESENCE OF ULCERS  *URINARY PROBLEMS  *BOWEL PROBLEMS  UNUSUAL RASH Items with * indicate a potential emergency and should be followed up as soon as possible.  Feel free to call the clinic you have any questions or concerns. The clinic phone number is [phone].    

## 2013-12-27 ENCOUNTER — Other Ambulatory Visit: Payer: Self-pay | Admitting: Medical Oncology

## 2013-12-27 ENCOUNTER — Ambulatory Visit (HOSPITAL_BASED_OUTPATIENT_CLINIC_OR_DEPARTMENT_OTHER): Payer: Medicare Other | Admitting: Nurse Practitioner

## 2013-12-27 ENCOUNTER — Other Ambulatory Visit (HOSPITAL_BASED_OUTPATIENT_CLINIC_OR_DEPARTMENT_OTHER): Payer: Medicare Other

## 2013-12-27 ENCOUNTER — Other Ambulatory Visit: Payer: Self-pay | Admitting: Nurse Practitioner

## 2013-12-27 ENCOUNTER — Ambulatory Visit (HOSPITAL_BASED_OUTPATIENT_CLINIC_OR_DEPARTMENT_OTHER): Payer: Medicare Other

## 2013-12-27 DIAGNOSIS — E083219 Diabetes mellitus due to underlying condition with mild nonproliferative diabetic retinopathy with macular edema, unspecified eye: Secondary | ICD-10-CM

## 2013-12-27 DIAGNOSIS — C9000 Multiple myeloma not having achieved remission: Secondary | ICD-10-CM

## 2013-12-27 DIAGNOSIS — Z5112 Encounter for antineoplastic immunotherapy: Secondary | ICD-10-CM

## 2013-12-27 DIAGNOSIS — M545 Low back pain, unspecified: Secondary | ICD-10-CM

## 2013-12-27 DIAGNOSIS — M549 Dorsalgia, unspecified: Secondary | ICD-10-CM

## 2013-12-27 DIAGNOSIS — Z5111 Encounter for antineoplastic chemotherapy: Secondary | ICD-10-CM

## 2013-12-27 DIAGNOSIS — E119 Type 2 diabetes mellitus without complications: Secondary | ICD-10-CM

## 2013-12-27 LAB — CBC WITH DIFFERENTIAL/PLATELET
BASO%: 0 % (ref 0.0–2.0)
Basophils Absolute: 0 10*3/uL (ref 0.0–0.1)
EOS ABS: 0 10*3/uL (ref 0.0–0.5)
EOS%: 0.6 % (ref 0.0–7.0)
HCT: 29 % — ABNORMAL LOW (ref 38.4–49.9)
HGB: 9.2 g/dL — ABNORMAL LOW (ref 13.0–17.1)
LYMPH%: 14.1 % (ref 14.0–49.0)
MCH: 24 pg — ABNORMAL LOW (ref 27.2–33.4)
MCHC: 31.7 g/dL — AB (ref 32.0–36.0)
MCV: 75.7 fL — AB (ref 79.3–98.0)
MONO#: 0.2 10*3/uL (ref 0.1–0.9)
MONO%: 6 % (ref 0.0–14.0)
NEUT#: 2.6 10*3/uL (ref 1.5–6.5)
NEUT%: 79.3 % — ABNORMAL HIGH (ref 39.0–75.0)
NRBC: 1 % — AB (ref 0–0)
Platelets: 109 10*3/uL — ABNORMAL LOW (ref 140–400)
RBC: 3.83 10*6/uL — AB (ref 4.20–5.82)
RDW: 15.3 % — AB (ref 11.0–14.6)
WBC: 3.3 10*3/uL — ABNORMAL LOW (ref 4.0–10.3)
lymph#: 0.5 10*3/uL — ABNORMAL LOW (ref 0.9–3.3)

## 2013-12-27 LAB — COMPREHENSIVE METABOLIC PANEL (CC13)
ALBUMIN: 3.6 g/dL (ref 3.5–5.0)
ALT: 11 U/L (ref 0–55)
AST: 16 U/L (ref 5–34)
Alkaline Phosphatase: 39 U/L — ABNORMAL LOW (ref 40–150)
Anion Gap: 11 mEq/L (ref 3–11)
BUN: 8.9 mg/dL (ref 7.0–26.0)
CALCIUM: 9.5 mg/dL (ref 8.4–10.4)
CHLORIDE: 102 meq/L (ref 98–109)
CO2: 25 mEq/L (ref 22–29)
Creatinine: 1 mg/dL (ref 0.7–1.3)
EGFR: 87 mL/min/{1.73_m2} — ABNORMAL LOW (ref >90)
Glucose: 192 mg/dl — ABNORMAL HIGH (ref 70–140)
POTASSIUM: 4.1 meq/L (ref 3.5–5.1)
Sodium: 138 mEq/L (ref 136–145)
Total Bilirubin: 1.31 mg/dL — ABNORMAL HIGH (ref 0.20–1.20)
Total Protein: 6.7 g/dL (ref 6.4–8.3)

## 2013-12-27 LAB — TECHNOLOGIST REVIEW

## 2013-12-27 MED ORDER — DEXTROSE 5 % IV SOLN
27.0000 mg/m2 | Freq: Once | INTRAVENOUS | Status: AC
  Administered 2013-12-27: 54 mg via INTRAVENOUS
  Filled 2013-12-27: qty 27

## 2013-12-27 MED ORDER — DEXAMETHASONE SODIUM PHOSPHATE 10 MG/ML IJ SOLN
INTRAMUSCULAR | Status: AC
  Filled 2013-12-27: qty 1

## 2013-12-27 MED ORDER — DEXAMETHASONE SODIUM PHOSPHATE 10 MG/ML IJ SOLN
10.0000 mg | Freq: Once | INTRAMUSCULAR | Status: AC
  Administered 2013-12-27: 10 mg via INTRAVENOUS

## 2013-12-27 MED ORDER — HEPARIN SOD (PORK) LOCK FLUSH 100 UNIT/ML IV SOLN
500.0000 [IU] | Freq: Once | INTRAVENOUS | Status: AC | PRN
  Administered 2013-12-27: 500 [IU]
  Filled 2013-12-27: qty 5

## 2013-12-27 MED ORDER — SODIUM CHLORIDE 0.9 % IJ SOLN
10.0000 mL | INTRAMUSCULAR | Status: DC | PRN
  Administered 2013-12-27: 10 mL
  Filled 2013-12-27: qty 10

## 2013-12-27 MED ORDER — SODIUM CHLORIDE 0.9 % IV SOLN
Freq: Once | INTRAVENOUS | Status: AC
  Administered 2013-12-27: 13:00:00 via INTRAVENOUS

## 2013-12-27 MED ORDER — SODIUM CHLORIDE 0.9 % IV SOLN
300.0000 mg/m2 | Freq: Once | INTRAVENOUS | Status: AC
  Administered 2013-12-27: 600 mg via INTRAVENOUS
  Filled 2013-12-27: qty 30

## 2013-12-27 MED ORDER — ONDANSETRON 8 MG/NS 50 ML IVPB
INTRAVENOUS | Status: AC
  Filled 2013-12-27: qty 8

## 2013-12-27 MED ORDER — ONDANSETRON 8 MG/50ML IVPB (CHCC)
8.0000 mg | Freq: Once | INTRAVENOUS | Status: AC
  Administered 2013-12-27: 8 mg via INTRAVENOUS

## 2013-12-27 NOTE — Patient Instructions (Signed)

## 2013-12-27 NOTE — Progress Notes (Signed)
Notified Aleatha Borer to assess patient's change in status d/t acute, intermittent, sharp pain in left hip when standing up or lying down. Pt states this has been happening for about a week and at it's worse it is a 8 or 9 and at it's best a 0-1

## 2013-12-28 ENCOUNTER — Ambulatory Visit (HOSPITAL_BASED_OUTPATIENT_CLINIC_OR_DEPARTMENT_OTHER): Payer: Medicare Other

## 2013-12-28 ENCOUNTER — Ambulatory Visit (HOSPITAL_COMMUNITY)
Admission: RE | Admit: 2013-12-28 | Discharge: 2013-12-28 | Disposition: A | Discharge status: Home / Self Care | Payer: Medicare Other | Source: Ambulatory Visit | Attending: Nurse Practitioner | Admitting: Nurse Practitioner

## 2013-12-28 ENCOUNTER — Other Ambulatory Visit: Payer: Self-pay | Admitting: Nurse Practitioner

## 2013-12-28 DIAGNOSIS — C9000 Multiple myeloma not having achieved remission: Secondary | ICD-10-CM

## 2013-12-28 DIAGNOSIS — Z5112 Encounter for antineoplastic immunotherapy: Secondary | ICD-10-CM

## 2013-12-28 MED ORDER — GADOBENATE DIMEGLUMINE 529 MG/ML IV SOLN
20.0000 mL | Freq: Once | INTRAVENOUS | Status: AC | PRN
  Administered 2013-12-28: 16 mL via INTRAVENOUS

## 2013-12-28 MED ORDER — ONDANSETRON 8 MG/50ML IVPB (CHCC)
8.0000 mg | Freq: Once | INTRAVENOUS | Status: AC
  Administered 2013-12-28: 8 mg via INTRAVENOUS

## 2013-12-28 MED ORDER — HEPARIN SOD (PORK) LOCK FLUSH 100 UNIT/ML IV SOLN
500.0000 [IU] | Freq: Once | INTRAVENOUS | Status: AC | PRN
  Administered 2013-12-28: 500 [IU]
  Filled 2013-12-28: qty 5

## 2013-12-28 MED ORDER — DEXTROSE 5 % IV SOLN
27.0000 mg/m2 | Freq: Once | INTRAVENOUS | Status: AC
  Administered 2013-12-28: 54 mg via INTRAVENOUS
  Filled 2013-12-28: qty 27

## 2013-12-28 MED ORDER — SODIUM CHLORIDE 0.9 % IV SOLN
Freq: Once | INTRAVENOUS | Status: AC
  Administered 2013-12-28: 12:00:00 via INTRAVENOUS

## 2013-12-28 MED ORDER — ONDANSETRON 8 MG/NS 50 ML IVPB
INTRAVENOUS | Status: AC
Start: 2013-12-28 — End: 2013-12-28
  Filled 2013-12-28: qty 8

## 2013-12-28 MED ORDER — DEXAMETHASONE SODIUM PHOSPHATE 10 MG/ML IJ SOLN
10.0000 mg | Freq: Once | INTRAMUSCULAR | Status: AC
  Administered 2013-12-28: 10 mg via INTRAVENOUS

## 2013-12-28 MED ORDER — DEXAMETHASONE SODIUM PHOSPHATE 10 MG/ML IJ SOLN
INTRAMUSCULAR | Status: AC
  Filled 2013-12-28: qty 1

## 2013-12-28 MED ORDER — SODIUM CHLORIDE 0.9 % IJ SOLN
10.0000 mL | INTRAMUSCULAR | Status: DC | PRN
  Administered 2013-12-28: 10 mL
  Filled 2013-12-28: qty 10

## 2013-12-28 NOTE — Patient Instructions (Signed)
[facility] Discharge Instructions for Patients Receiving Chemotherapy  Today you received the following chemotherapy agents Kyprolis  To help prevent nausea and vomiting after your treatment, we encourage you to take your nausea medication Compazine 10 mg every 6 hours as needed. If you develop nausea and vomiting that is not controlled by your nausea medication, call the clinic.   BELOW ARE SYMPTOMS THAT SHOULD BE REPORTED IMMEDIATELY:  *FEVER GREATER THAN 100.5 F  *CHILLS WITH OR WITHOUT FEVER  NAUSEA AND VOMITING THAT IS NOT CONTROLLED WITH YOUR NAUSEA MEDICATION  *UNUSUAL SHORTNESS OF BREATH  *UNUSUAL BRUISING OR BLEEDING  TENDERNESS IN MOUTH AND THROAT WITH OR WITHOUT PRESENCE OF ULCERS  *URINARY PROBLEMS  *BOWEL PROBLEMS  UNUSUAL RASH Items with * indicate a potential emergency and should be followed up as soon as possible.  Feel free to call the clinic you have any questions or concerns. The clinic phone number is [phone].    

## 2013-12-29 ENCOUNTER — Telehealth: Payer: Self-pay | Admitting: *Deleted

## 2013-12-29 ENCOUNTER — Encounter: Payer: Self-pay | Admitting: Nurse Practitioner

## 2013-12-29 DIAGNOSIS — M549 Dorsalgia, unspecified: Secondary | ICD-10-CM | POA: Insufficient documentation

## 2013-12-29 NOTE — Progress Notes (Signed)
Reviewed lumbar MRI results with patient today.  There is no noted cord compression.  There was widespread marrow heterogeneity reflective multiple myeloma.  Central and leftward protrusion at L4-5 results in subarticular zone narrowing, affecting the left L5 nerve root.  Circular enhancement of what appears to be the right L3 nerve root in the right L3-4 foramen likely represents compressive radiculitis due to asymmetric foraminal zone and subarticular zone narrowing.  Advised patient that most likely his new onset left lower back pain is due to radiculitis.  Patient has previously seen a local orthopedist for other back issues.  Advised patient to follow-up with his orthopedist of for further evaluation and possible treatment.

## 2013-12-29 NOTE — Assessment & Plan Note (Signed)
Complaint of left lower back pain for the past several days.  Denies any known injury or trauma.  Will schedule a lumbar MRI for further evaluation.

## 2013-12-29 NOTE — Progress Notes (Signed)
will   SYMPTOM MANAGEMENT CLINIC   HPI: Eddie Thomas 69 y.o. male diagnosed with multiple myeloma.  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  Currently undergoing carfilzomib/cyclophosphamide/dexamethasone chemotherapy regimen.   Patient presented to the [facility] today to receive cycle 16, day 15 of his chemotherapy regimen.  He is also complaining of some new onset left lower back pain that does not radiate.  He denies any recent injury or trauma to this area.  He denies any UTI symptoms whatsoever.  He denies any recent fevers or chills.  Also, patient is complaining of increased blood sugars recently.  He states that his diabetes has been managed fairly well in the past with oral medication; but since he has been taking dexamethasone-his blood sugars have been much higher.  HPI  CURRENT THERAPY: Upcoming Treatment Dates - MYELOMA SALVAGE Cyclophosphamide / Carfilzomib / Dexamethasone (CCd) q28d Days with orders from any treatment category:  01/10/2014      SCHEDULING COMMUNICATION      ondansetron (ZOFRAN) IVPB 8 mg      dexamethasone (DECADRON) injection 10 mg      cyclophosphamide (CYTOXAN) 600 mg in sodium chloride 0.9 % 250 mL chemo infusion      carfilzomib (KYPROLIS) 54 mg in dextrose 5 % 50 mL chemo infusion      sodium chloride 0.9 % injection 10 mL      heparin lock flush 100 unit/mL      heparin lock flush 100 unit/mL      alteplase (CATHFLO ACTIVASE) injection 2 mg      sodium chloride 0.9 % injection 3 mL      diphenhydrAMINE (BENADRYL) injection 25 mg      famotidine (PEPCID) IVPB 20 mg      0.9 %  sodium chloride infusion      methylPREDNISolone sodium succinate (SOLU-MEDROL) 125 mg/2 mL injection 125 mg      EPINEPHrine (ADRENALIN) 0.1 MG/ML injection 0.25 mg      EPINEPHrine (ADRENALIN) 0.1 MG/ML injection 0.25 mg      EPINEPHrine (ADRENALIN) injection 0.5 mg      EPINEPHrine (ADRENALIN) injection 0.5 mg  diphenhydrAMINE (BENADRYL) injection 50 mg      albuterol (PROVENTIL) (2.5 MG/3ML) 0.083% nebulizer solution 2.5 mg      0.9 %  sodium chloride infusion      0.9 %  sodium chloride infusion      ONCBCN PHYSICIAN COMMUNICATION #2      TREATMENT CONDITIONS      [facility] NURSING COMMUNICATION 01/11/2014      SCHEDULING COMMUNICATION      ondansetron (ZOFRAN) IVPB 8 mg      dexamethasone (DECADRON) injection 10 mg      carfilzomib (KYPROLIS) 54 mg in dextrose 5 % 50 mL chemo infusion      sodium chloride 0.9 % injection 10 mL      heparin lock flush 100 unit/mL      heparin lock flush 100 unit/mL      alteplase (CATHFLO ACTIVASE) injection 2 mg      sodium chloride 0.9 % injection 3 mL      diphenhydrAMINE (BENADRYL) injection 25 mg      famotidine (PEPCID) IVPB 20 mg      0.9 %  sodium chloride infusion      methylPREDNISolone sodium succinate (SOLU-MEDROL) 125 mg/2 mL injection 125 mg      EPINEPHrine (ADRENALIN) 0.1 MG/ML injection 0.25 mg  EPINEPHrine (ADRENALIN) 0.1 MG/ML injection 0.25 mg      EPINEPHrine (ADRENALIN) injection 0.5 mg      EPINEPHrine (ADRENALIN) injection 0.5 mg      diphenhydrAMINE (BENADRYL) injection 50 mg      albuterol (PROVENTIL) (2.5 MG/3ML) 0.083% nebulizer solution 2.5 mg      0.9 %  sodium chloride infusion      0.9 %  sodium chloride infusion 01/17/2014      SCHEDULING COMMUNICATION      ondansetron (ZOFRAN) IVPB 8 mg      dexamethasone (DECADRON) injection 10 mg      cyclophosphamide (CYTOXAN) 600 mg in sodium chloride 0.9 % 250 mL chemo infusion      carfilzomib (KYPROLIS) 54 mg in dextrose 5 % 50 mL chemo infusion      sodium chloride 0.9 % injection 10 mL      heparin lock flush 100 unit/mL      heparin lock flush 100 unit/mL      alteplase (CATHFLO ACTIVASE) injection 2 mg      sodium chloride 0.9 % injection 3 mL      diphenhydrAMINE (BENADRYL) injection 25 mg      famotidine (PEPCID) IVPB 20 mg      0.9 %  sodium chloride infusion       methylPREDNISolone sodium succinate (SOLU-MEDROL) 125 mg/2 mL injection 125 mg      EPINEPHrine (ADRENALIN) 0.1 MG/ML injection 0.25 mg      EPINEPHrine (ADRENALIN) 0.1 MG/ML injection 0.25 mg      EPINEPHrine (ADRENALIN) injection 0.5 mg      EPINEPHrine (ADRENALIN) injection 0.5 mg      diphenhydrAMINE (BENADRYL) injection 50 mg      albuterol (PROVENTIL) (2.5 MG/3ML) 0.083% nebulizer solution 2.5 mg      0.9 %  sodium chloride infusion      0.9 %  sodium chloride infusion      TREATMENT CONDITIONS      [facility] NURSING COMMUNICATION    ROS  Past Medical History  Diagnosis Date  . Multiple myeloma   . Diabetes mellitus 07/03/2011  . Hypertension 07/03/2011  . Hyperlipidemia 07/03/2011  . Colon polyps 2012  . Gastric ulcer     Past Surgical History  Procedure Laterality Date  . Limbal stem cell transplant    . Humerus fracture surgery      right  . 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    has Multiple myeloma; Diabetes mellitus; Hypertension; Hyperlipidemia; Dysphagia, pharyngoesophageal; URI (upper respiratory infection); and Back pain on his problem list.     has No Known Allergies.    Medication List       This list is accurate as of: 12/27/13 11:59 PM.  Always use your most recent med list.               lidocaine-prilocaine cream  Commonly known as:  EMLA  APPLY CREAM TOPICALLY TO AFFECTED AREA AS DIRECTED     lisinopril 10 MG tablet  Commonly known as:  PRINIVIL,ZESTRIL  Take 5 mg by mouth every evening.     oxyCODONE-acetaminophen 5-325 MG per tablet  Commonly known as:  PERCOCET/ROXICET  Take 1 tablet by mouth every 6 (six) hours as needed for severe pain.     pantoprazole 40 MG tablet  Commonly known as:  PROTONIX  Take 1 tablet (40 mg total) by mouth 2 (two) times daily.     pioglitazone 15  MG tablet  Commonly known as:  ACTOS  Take 15 mg by mouth daily.     prochlorperazine 10 MG tablet  Commonly known as:  COMPAZINE    Take 10 mg by mouth every 6 (six) hours as needed for nausea or vomiting.     simvastatin 10 MG tablet  Commonly known as:  ZOCOR  Take 10 mg by mouth at bedtime.     temazepam 15 MG capsule  Commonly known as:  RESTORIL  Take 15 mg by mouth at bedtime as needed for sleep.     valACYclovir 500 MG tablet  Commonly known as:  VALTREX  TAKE ONE TABLET BY MOUTH ONCE DAILY     VIAGRA 50 MG tablet  Generic drug:  sildenafil  Take 50 mg by mouth daily as needed for erectile dysfunction.     Vitamin D-3 5000 UNITS Tabs  Take 1 tablet by mouth daily.         PHYSICAL EXAMINATION  BP: 145/76, HR 62, temp 97  Physical Exam  Constitutional: He is oriented to person, place, and time and well-developed, well-nourished, and in no distress.  HENT:  Head: Normocephalic and atraumatic.  Mouth/Throat: Oropharynx is clear and moist.  Eyes: Conjunctivae and EOM are normal. Pupils are equal, round, and reactive to light. Right eye exhibits no discharge. Left eye exhibits no discharge. No scleral icterus.  Neck: Normal range of motion. Neck supple. No JVD present.  Pulmonary/Chest: Effort normal. No respiratory distress.  Musculoskeletal: Normal range of motion. He exhibits tenderness. He exhibits no edema.  Slight tenderness with palpation to left lower back directly above the beltline.  Patient was observed with full range of motion and in no acute distress.  No noted weakness on exam.  Neurological: He is alert and oriented to person, place, and time. Gait normal.  Skin: Skin is warm and dry. No rash noted. No erythema.  Psychiatric: Affect normal.  Nursing note and vitals reviewed.   LABORATORY DATA:. Appointment on 12/27/2013  Component Date Value Ref Range Status  . WBC 12/27/2013 3.3* 4.0 - 10.3 10e3/uL Final  . NEUT# 12/27/2013 2.6  1.5 - 6.5 10e3/uL Final  . HGB 12/27/2013 9.2* 13.0 - 17.1 g/dL Final  . HCT 12/27/2013 29.0* 38.4 - 49.9 % Final  . Platelets 12/27/2013 109* 140 -  400 10e3/uL Final  . MCV 12/27/2013 75.7* 79.3 - 98.0 fL Final  . MCH 12/27/2013 24.0* 27.2 - 33.4 pg Final  . MCHC 12/27/2013 31.7* 32.0 - 36.0 g/dL Final  . RBC 12/27/2013 3.83* 4.20 - 5.82 10e6/uL Final  . RDW 12/27/2013 15.3* 11.0 - 14.6 % Final  . lymph# 12/27/2013 0.5* 0.9 - 3.3 10e3/uL Final  . MONO# 12/27/2013 0.2  0.1 - 0.9 10e3/uL Final  . Eosinophils Absolute 12/27/2013 0.0  0.0 - 0.5 10e3/uL Final  . Basophils Absolute 12/27/2013 0.0  0.0 - 0.1 10e3/uL Final  . NEUT% 12/27/2013 79.3* 39.0 - 75.0 % Final  . LYMPH% 12/27/2013 14.1  14.0 - 49.0 % Final  . MONO% 12/27/2013 6.0  0.0 - 14.0 % Final  . EOS% 12/27/2013 0.6  0.0 - 7.0 % Final  . BASO% 12/27/2013 0.0  0.0 - 2.0 % Final  . nRBC 12/27/2013 1* 0 - 0 % Final  . Sodium 12/27/2013 138  136 - 145 mEq/L Final  . Potassium 12/27/2013 4.1  3.5 - 5.1 mEq/L Final  . Chloride 12/27/2013 102  98 - 109 mEq/L Final  . CO2 12/27/2013 25  22 - 29 mEq/L Final  . Glucose 12/27/2013 192* 70 - 140 mg/dl Final  . BUN 12/27/2013 8.9  7.0 - 26.0 mg/dL Final  . Creatinine 12/27/2013 1.0  0.7 - 1.3 mg/dL Final  . Total Bilirubin 12/27/2013 1.31* 0.20 - 1.20 mg/dL Final  . Alkaline Phosphatase 12/27/2013 39* 40 - 150 U/L Final  . AST 12/27/2013 16  5 - 34 U/L Final  . ALT 12/27/2013 11  0 - 55 U/L Final  . Total Protein 12/27/2013 6.7  6.4 - 8.3 g/dL Final  . Albumin 12/27/2013 3.6  3.5 - 5.0 g/dL Final  . Calcium 12/27/2013 9.5  8.4 - 10.4 mg/dL Final  . Anion Gap 12/27/2013 11  3 - 11 mEq/L Final  . EGFR 12/27/2013 87* >90 ml/min/1.73 m2 Final   eGFR is calculated using the CKD-EPI Creatinine Equation (2009)  . Technologist Review 12/27/2013 Rouleaux, Moderate Schistocytes and Ovalos, few Helmet, teardrop, and Target cells, Sl Poly   Final     RADIOGRAPHIC STUDIES: Mr Lumbar Spine W Wo Contrast  12/28/2013   CLINICAL DATA:  LEFT-sided low back pain for 7 days. History of myeloma.  EXAM: MRI LUMBAR SPINE WITHOUT AND WITH CONTRAST   TECHNIQUE: Multiplanar and multiecho pulse sequences of the lumbar spine were obtained without and with intravenous contrast.  CONTRAST:  58m MULTIHANCE GADOBENATE DIMEGLUMINE 529 MG/ML IV SOLN  COMPARISON:  Bone survey 07/30/2010.  FINDINGS: Segmentation: Normal.  Alignment:  Normal.  Vertebrae: Widespread foci of marrow replacement in the visualized lumbar vertebrae and sacrum consistent with myeloma. Tiny Schmorl's node projects inferiorly from the L1-2 disc space with surrounding osseous reaction. Minimal superior endplate deformity at L2 was observed on prior bone survey. No pathologic fracture.  Conus medullaris: Normal in size, signal, and location.  Paraspinal tissues: No evidence for hydronephrosis or paravertebral mass. Renal cystic disease is incompletely evaluated.  Disc levels:  L1-L2:  Normal.  L2-L3:  Normal disc space.  Mild facet arthropathy.  No impingement.  L3-L4: RIGHT-sided foraminal protrusion. Asymmetric facet arthropathy and ligamentum flavum hypertrophy, also on the RIGHT. Mild annular bulging centrally. RIGHT greater than LEFT subarticular zone and foraminal zone narrowing affect the L3 and L4 nerve roots. In the RIGHT neural foramen there is circular enhancement of what appears to be an inflamed, compressed RIGHT L3 nerve root. This is unlikely represent extraosseous manifestation of myeloma. Nerve sheath tumor is less favored as well. Significance is doubtful as the patient's reported back pain is on the opposite side.  L4-L5: Central and leftward protrusion. Mild facet and ligamentum flavum hypertrophy. Mild central canal stenosis LEFT subarticular zone narrowing affects the L5 nerve root. Mild foraminal narrowing without clear-cut L4 nerve root impingement.  L5-S1: Shallow central protrusion. Mild facet and ligamentum flavum hypertrophy. No impingement.  IMPRESSION: Widespread marrow heterogeneity reflecting multiple myeloma.  Central and leftward protrusion at L4-5 results in  subarticular zone narrowing, affecting the LEFT L5 nerve root. Correlate clinically.  Circular enhancement of what appears to be the RIGHT L3 nerve root in the RIGHT L3-4 foramen likely represents compressive radiculitis due to asymmetric foraminal zone and subarticular zone narrowing.   Electronically Signed   By: JRolla FlattenM.D.   On: 12/28/2013 11:52    ASSESSMENT/PLAN:    Back pain Complaint of left lower back pain for the past several days.  Denies any known injury or trauma.  Will schedule a lumbar MRI for further evaluation.  Multiple myeloma Patient is [facility].  Patient is currently undergoing carfilzomib/cyclophosphamide/dexamethasone therapy.  He is to receive cycle 16, day 15 of his chemotherapy regimen today.  He has plans to return tomorrow to receive cycle 16, day 16 of the same regimen.  He will also continue to receive Zometa on an every 8 week regimen.  He last received Zometa on 11/16/2013.  He will be GU for his next Zometa infusion on 01/16/2014.  Diabetes mellitus Patient has a history of diabetes.  Patient has been taken oral diabetic medication to control his diabetes in the past.  However, the chronic use of dexamethasone per his chemotherapy regimen has caused some increased blood sugars recently.  Blood sugar today was 192.  Advised patient to check in with his primary care physician in regards to closer management and control of his blood sugars.  Patient also states that he does have a glucometer at home; and he will check his blood sugars often.   Patient stated understanding of all instructions; and was in agreement with this plan of care. The patient knows to call the clinic with any problems, questions or concerns.   Review/collaboration with Dr. Julien Nordmann regarding all aspects of patient's visit today.   Total time spent with patient was 25 minutes;  with greater than 75 percent of that time spent in face to  face counseling regarding his symptoms, and coordination of care and follow up.  Disclaimer: This note was dictated with voice recognition software. Similar sounding words can inadvertently be transcribed and may not be corrected upon review.   Drue Second, NP 12/29/2013

## 2013-12-29 NOTE — Assessment & Plan Note (Signed)
Patient is status post himself transplant per [facility] on 09/27/2010.  Patient is currently undergoing carfilzomib/cyclophosphamide/dexamethasone therapy.  He is to receive cycle 16, day 15 of his chemotherapy regimen today.  He has plans to return tomorrow to receive cycle 16, day 16 of the same regimen.  He will also continue to receive Zometa on an every 8 week regimen.  He last received Zometa on 11/16/2013.  He will be GU for his next Zometa infusion on 01/16/2014.

## 2013-12-29 NOTE — Assessment & Plan Note (Signed)
Patient has a history of diabetes.  Patient has been taken oral diabetic medication to control his diabetes in the past.  However, the chronic use of dexamethasone per his chemotherapy regimen has caused some increased blood sugars recently.  Blood sugar today was 192.  Advised patient to check in with his primary care physician in regards to closer management and control of his blood sugars.  Patient also states that he does have a glucometer at home; and he will check his blood sugars often.

## 2013-12-29 NOTE — Telephone Encounter (Signed)
This RN left a message on patient's home answering machine asking if he had an orthopedic doctor. Instructed patient to call [phone] with name and number if he had one already. If not, this office needs to make a referral to one.

## 2013-12-30 ENCOUNTER — Telehealth: Payer: Self-pay | Admitting: Medical Oncology

## 2013-12-30 NOTE — Telephone Encounter (Signed)
Dr Onnie Graham is his orthopedist.

## 2013-12-30 NOTE — Telephone Encounter (Signed)
Records faxed with receipt.

## 2014-01-03 ENCOUNTER — Other Ambulatory Visit (HOSPITAL_BASED_OUTPATIENT_CLINIC_OR_DEPARTMENT_OTHER): Payer: Medicare Other

## 2014-01-03 ENCOUNTER — Telehealth: Payer: Self-pay | Admitting: Nurse Practitioner

## 2014-01-03 DIAGNOSIS — C9000 Multiple myeloma not having achieved remission: Secondary | ICD-10-CM

## 2014-01-03 LAB — COMPREHENSIVE METABOLIC PANEL (CC13)
ALBUMIN: 3.4 g/dL — AB (ref 3.5–5.0)
ALT: 15 U/L (ref 0–55)
ANION GAP: 10 meq/L (ref 3–11)
AST: 15 U/L (ref 5–34)
Alkaline Phosphatase: 40 U/L (ref 40–150)
BUN: 8.7 mg/dL (ref 7.0–26.0)
CALCIUM: 9.2 mg/dL (ref 8.4–10.4)
CHLORIDE: 102 meq/L (ref 98–109)
CO2: 25 mEq/L (ref 22–29)
CREATININE: 1 mg/dL (ref 0.7–1.3)
EGFR: 88 mL/min/{1.73_m2} — AB (ref >90)
GLUCOSE: 264 mg/dL — AB (ref 70–140)
POTASSIUM: 4.1 meq/L (ref 3.5–5.1)
Sodium: 138 mEq/L (ref 136–145)
Total Bilirubin: 1.36 mg/dL — ABNORMAL HIGH (ref 0.20–1.20)
Total Protein: 6.3 g/dL — ABNORMAL LOW (ref 6.4–8.3)

## 2014-01-03 LAB — CBC WITH DIFFERENTIAL/PLATELET
BASO%: 0.3 % (ref 0.0–2.0)
BASOS ABS: 0 10*3/uL (ref 0.0–0.1)
EOS ABS: 0 10*3/uL (ref 0.0–0.5)
EOS%: 0.4 % (ref 0.0–7.0)
HCT: 28.2 % — ABNORMAL LOW (ref 38.4–49.9)
HEMOGLOBIN: 8.7 g/dL — AB (ref 13.0–17.1)
LYMPH#: 0.3 10*3/uL — AB (ref 0.9–3.3)
LYMPH%: 10.3 % — ABNORMAL LOW (ref 14.0–49.0)
MCH: 23.9 pg — AB (ref 27.2–33.4)
MCHC: 30.9 g/dL — ABNORMAL LOW (ref 32.0–36.0)
MCV: 77.3 fL — AB (ref 79.3–98.0)
MONO#: 0.3 10*3/uL (ref 0.1–0.9)
MONO%: 10.5 % (ref 0.0–14.0)
NEUT%: 78.5 % — AB (ref 39.0–75.0)
NEUTROS ABS: 2.4 10*3/uL (ref 1.5–6.5)
Platelets: 155 10*3/uL (ref 140–400)
RBC: 3.65 10*6/uL — ABNORMAL LOW (ref 4.20–5.82)
RDW: 15.5 % — ABNORMAL HIGH (ref 11.0–14.6)
WBC: 3.1 10*3/uL — ABNORMAL LOW (ref 4.0–10.3)

## 2014-01-03 LAB — LACTATE DEHYDROGENASE (CC13): LDH: 172 U/L (ref 125–245)

## 2014-01-03 NOTE — Telephone Encounter (Signed)
Patient was questioning his orthopedic referral for new onset lumbar pain.  Patient obtained a lumbar MRI just last week which revealed some lumbar nerve root inflammation.  Patient has seen Dr. Onnie Graham orthopedist in the past.  Called Dr. supple's office this afternoon; and was given confirmation that patient will see Dr. Nelva Bush and obtain a spinal injection as soon as possible.  Their office is currently awaiting prior authorization per patient's insurance company.  I called Eddie Thomas today to let him know the status of his pending orthopedic appointment and planned injection.  Advised Eddie Thomas to follow-up with orthopedist office if he doesn't hear back from them within the next 1-2 days to determine the status of his prior authorization and planned appointment.  Patient stated understanding of all instructions; and was in agreement with this plan of care.

## 2014-01-05 LAB — IGG, IGA, IGM
IGA: 1300 mg/dL — AB (ref 68–379)
IgG (Immunoglobin G), Serum: 121 mg/dL — ABNORMAL LOW (ref 650–1600)

## 2014-01-05 LAB — KAPPA/LAMBDA LIGHT CHAINS
Kappa free light chain: 0.03 mg/dL — ABNORMAL LOW (ref 0.33–1.94)
Kappa:Lambda Ratio: 0 — ABNORMAL LOW (ref 0.26–1.65)
Lambda Free Lght Chn: 10.6 mg/dL — ABNORMAL HIGH (ref 0.57–2.63)

## 2014-01-05 LAB — BETA 2 MICROGLOBULIN, SERUM: Beta-2 Microglobulin: 3.13 mg/L — ABNORMAL HIGH

## 2014-01-06 ENCOUNTER — Telehealth: Payer: Self-pay | Admitting: *Deleted

## 2014-01-06 NOTE — Telephone Encounter (Signed)
Pt called stating that he has not heard from Dr Nelva Bush' office regarding getting a spinal injection.  Dr Nelva Bush' office was contacted to follow up.  Re-faxed referral to [facility].  Amelia Jo spoke with scheduler Jackelyn Poling to discuss the referral.  Referral made for Dec 23rd at 9:00am.  Will inform patient.  Fax # [phone].  Pt is aware of appt.  He will try to move the appt otherwise we may need to move his chemo appt on 12/23 at 8:15.  Informed pt we can r/s when he comes in on 12/22 if needed.  Pt verbalized understanding.

## 2014-01-09 ENCOUNTER — Other Ambulatory Visit: Payer: Self-pay | Admitting: *Deleted

## 2014-01-09 DIAGNOSIS — C9000 Multiple myeloma not having achieved remission: Secondary | ICD-10-CM

## 2014-01-10 ENCOUNTER — Telehealth: Payer: Self-pay | Admitting: Internal Medicine

## 2014-01-10 ENCOUNTER — Ambulatory Visit (HOSPITAL_BASED_OUTPATIENT_CLINIC_OR_DEPARTMENT_OTHER): Payer: Medicare Other

## 2014-01-10 ENCOUNTER — Ambulatory Visit (HOSPITAL_BASED_OUTPATIENT_CLINIC_OR_DEPARTMENT_OTHER): Payer: Medicare Other | Admitting: Internal Medicine

## 2014-01-10 ENCOUNTER — Encounter: Payer: Self-pay | Admitting: Internal Medicine

## 2014-01-10 ENCOUNTER — Ambulatory Visit (HOSPITAL_BASED_OUTPATIENT_CLINIC_OR_DEPARTMENT_OTHER): Payer: Medicare Other | Admitting: Lab

## 2014-01-10 VITALS — BP 134/74 | HR 70 | Temp 97.5°F | Resp 18 | Ht 71.0 in | Wt 173.0 lb

## 2014-01-10 DIAGNOSIS — C9000 Multiple myeloma not having achieved remission: Secondary | ICD-10-CM

## 2014-01-10 DIAGNOSIS — Z5112 Encounter for antineoplastic immunotherapy: Secondary | ICD-10-CM

## 2014-01-10 DIAGNOSIS — M545 Low back pain, unspecified: Secondary | ICD-10-CM

## 2014-01-10 DIAGNOSIS — Z5111 Encounter for antineoplastic chemotherapy: Secondary | ICD-10-CM

## 2014-01-10 LAB — COMPREHENSIVE METABOLIC PANEL (CC13)
ALBUMIN: 3.6 g/dL (ref 3.5–5.0)
ALK PHOS: 39 U/L — AB (ref 40–150)
ALT: 13 U/L (ref 0–55)
AST: 17 U/L (ref 5–34)
Anion Gap: 9 mEq/L (ref 3–11)
BUN: 12 mg/dL (ref 7.0–26.0)
CALCIUM: 9.3 mg/dL (ref 8.4–10.4)
CHLORIDE: 103 meq/L (ref 98–109)
CO2: 26 meq/L (ref 22–29)
Creatinine: 1.1 mg/dL (ref 0.7–1.3)
EGFR: 82 mL/min/{1.73_m2} — AB (ref >90)
GLUCOSE: 140 mg/dL (ref 70–140)
POTASSIUM: 4.1 meq/L (ref 3.5–5.1)
SODIUM: 139 meq/L (ref 136–145)
Total Bilirubin: 1.28 mg/dL — ABNORMAL HIGH (ref 0.20–1.20)
Total Protein: 6.9 g/dL (ref 6.4–8.3)

## 2014-01-10 LAB — CBC WITH DIFFERENTIAL/PLATELET
BASO%: 0.5 % (ref 0.0–2.0)
BASOS ABS: 0 10*3/uL (ref 0.0–0.1)
EOS%: 1.1 % (ref 0.0–7.0)
Eosinophils Absolute: 0 10*3/uL (ref 0.0–0.5)
HEMATOCRIT: 29.7 % — AB (ref 38.4–49.9)
HEMOGLOBIN: 9.3 g/dL — AB (ref 13.0–17.1)
LYMPH%: 22.5 % (ref 14.0–49.0)
MCH: 24.1 pg — AB (ref 27.2–33.4)
MCHC: 31.2 g/dL — ABNORMAL LOW (ref 32.0–36.0)
MCV: 77.2 fL — ABNORMAL LOW (ref 79.3–98.0)
MONO#: 0.4 10*3/uL (ref 0.1–0.9)
MONO%: 13.6 % (ref 0.0–14.0)
NEUT%: 62.3 % (ref 39.0–75.0)
NEUTROS ABS: 1.6 10*3/uL (ref 1.5–6.5)
Platelets: 164 10*3/uL (ref 140–400)
RBC: 3.85 10*6/uL — ABNORMAL LOW (ref 4.20–5.82)
RDW: 16.3 % — ABNORMAL HIGH (ref 11.0–14.6)
WBC: 2.6 10*3/uL — AB (ref 4.0–10.3)
lymph#: 0.6 10*3/uL — ABNORMAL LOW (ref 0.9–3.3)

## 2014-01-10 MED ORDER — SODIUM CHLORIDE 0.9 % IV SOLN
300.0000 mg/m2 | Freq: Once | INTRAVENOUS | Status: AC
  Administered 2014-01-10: 600 mg via INTRAVENOUS
  Filled 2014-01-10: qty 30

## 2014-01-10 MED ORDER — DEXTROSE 5 % IV SOLN
27.0000 mg/m2 | Freq: Once | INTRAVENOUS | Status: AC
  Administered 2014-01-10: 54 mg via INTRAVENOUS
  Filled 2014-01-10: qty 27

## 2014-01-10 MED ORDER — ONDANSETRON 8 MG/NS 50 ML IVPB
INTRAVENOUS | Status: AC
  Filled 2014-01-10: qty 8

## 2014-01-10 MED ORDER — HEPARIN SOD (PORK) LOCK FLUSH 100 UNIT/ML IV SOLN
500.0000 [IU] | Freq: Once | INTRAVENOUS | Status: AC | PRN
  Administered 2014-01-10: 500 [IU]
  Filled 2014-01-10: qty 5

## 2014-01-10 MED ORDER — ONDANSETRON 8 MG/50ML IVPB (CHCC)
8.0000 mg | Freq: Once | INTRAVENOUS | Status: AC
  Administered 2014-01-10: 8 mg via INTRAVENOUS

## 2014-01-10 MED ORDER — DEXAMETHASONE SODIUM PHOSPHATE 10 MG/ML IJ SOLN
INTRAMUSCULAR | Status: AC
  Filled 2014-01-10: qty 1

## 2014-01-10 MED ORDER — SODIUM CHLORIDE 0.9 % IV SOLN
Freq: Once | INTRAVENOUS | Status: AC
  Administered 2014-01-10: 10:00:00 via INTRAVENOUS

## 2014-01-10 MED ORDER — DEXAMETHASONE SODIUM PHOSPHATE 10 MG/ML IJ SOLN
10.0000 mg | Freq: Once | INTRAMUSCULAR | Status: AC
  Administered 2014-01-10: 10 mg via INTRAVENOUS

## 2014-01-10 MED ORDER — SODIUM CHLORIDE 0.9 % IJ SOLN
10.0000 mL | INTRAMUSCULAR | Status: DC | PRN
  Administered 2014-01-10: 10 mL
  Filled 2014-01-10: qty 10

## 2014-01-10 NOTE — Progress Notes (Signed)
[facility] [phone]   [phone]  OFFICE PROGRESS NOTE  Eddie Thomas 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   PRIOR THERAPY: :  1. Status post 6 cycles of systemic chemotherapy with Revlimid and Decadron, last dose was given 07/21/2010 with very good response. 2. 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 [name].  3. maintenance Revlimid at 10 mg by mouth daily status post 2 months. Therapy began 01/18/2011. 4. maintenance Revlimid at 15 mg by mouth daily with prophylactic dose Coumadin at 2 mg by mouth daily. 5. Systemic chemotherapy with Velcade at 1.3 mg per meter squared given on days 1, 4, 8 and 11 and Doxil at 30 mg per meter square given on day 4 and Decadron 40 mg by mouth on weekly basis given every 3 weeks. Status post 4 cycles. 6. Zometa 4 mg IV every 4 weeks.  7. Velcade 1.3 mg/M2 subcutaneous daily on a weekly basis with Decadron 20 mg by mouth on a weekly basis. First cycle expected on 06/21/2012. S/P 4 cycles.  CURRENT THERAPY:  1) Systemic chemotherapy with Carfilzomib, cyclophosphamide and Decadron. First cycle started on 08/09/2012. He is status post 16 cycles. 2) Zometa every 8 weeks. Next dose 05/27/2013.   INTERVAL HISTORY: Eddie Thomas 68 y.o. male returns to the clinic today for followup visit. He is currently on treatment with Carfilzomib, Cytoxan and dexamethasone status post 16 cycles and tolerating his treatment fairly well. He has some aching pain on the hips bilaterally. He was referred to Dr. Nelva Bush for evaluation and consideration of steroid injection. He is scheduled to see him in the next few days. The patient is feeling fine with no specific complaints except some mild tenderness in the left breast but no palpable mass in that area. He denied having any  significant weight loss or night sweats. He has no fever or chills. He has no nausea or vomiting. The patient denied having any significant chest pain, shortness of breath, cough or hemoptysis. He had repeat myeloma panel performed recently and he is here for evaluation and discussion of his lab results before starting cycle #17.  MEDICAL HISTORY: Past Medical History  Diagnosis Date  . Multiple myeloma   . Diabetes mellitus 07/03/2011  . Hypertension 07/03/2011  . Hyperlipidemia 07/03/2011  . Colon polyps 2012  . Gastric ulcer     ALLERGIES:  has No Known Allergies.  MEDICATIONS:  Current Outpatient Prescriptions  Medication Sig Dispense Refill  . Cholecalciferol (VITAMIN D-3) 5000 UNITS TABS Take 1 tablet by mouth daily.    [name] lidocaine-prilocaine (EMLA) cream APPLY CREAM TOPICALLY TO AFFECTED AREA AS DIRECTED 30 g 1  . lisinopril (PRINIVIL,ZESTRIL) 10 MG tablet Take 5 mg by mouth every evening.     [name] oxyCODONE-acetaminophen (PERCOCET/ROXICET) 5-325 MG per tablet Take 1 tablet by mouth every 6 (six) hours as needed for severe pain. 30 tablet 0  . pantoprazole (PROTONIX) 40 MG tablet Take 1 tablet (40 mg total) by mouth 2 (two) times daily. 60 tablet 11  . pioglitazone (ACTOS) 15 MG tablet Take 15 mg by mouth daily.    . prochlorperazine (COMPAZINE) 10 MG tablet Take 10 mg by mouth every 6 (six) hours as needed for nausea or vomiting.    . simvastatin (ZOCOR) 10 MG tablet Take 10 mg by mouth at bedtime.      [name]  temazepam (RESTORIL) 15 MG capsule Take 15 mg by mouth at bedtime as needed for sleep.    . valACYclovir (VALTREX) 500 MG tablet TAKE ONE TABLET BY MOUTH ONCE DAILY 30 tablet 3  . VIAGRA 50 MG tablet Take 50 mg by mouth daily as needed for erectile dysfunction.      No current facility-administered medications for this visit.    SURGICAL HISTORY:  Past Surgical History  Procedure Laterality Date  . Limbal stem cell transplant    . Humerus fracture surgery      right  .  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    REVIEW OF SYSTEMS:  Constitutional: positive for fatigue Eyes: negative Ears, nose, mouth, throat, and face: negative Respiratory: negative Cardiovascular: negative Gastrointestinal: negative Genitourinary:negative Integument/breast: negative Hematologic/lymphatic: negative Musculoskeletal:positive for Bilateral hip pain Neurological: negative Behavioral/Psych: negative Endocrine: negative Allergic/Immunologic: negative   PHYSICAL EXAMINATION: General appearance: alert, cooperative and no distress Head: Normocephalic, without obvious abnormality, atraumatic Neck: no adenopathy, no JVD, supple, symmetrical, trachea midline and thyroid not enlarged, symmetric, no tenderness/mass/nodules Lymph nodes: Cervical, supraclavicular, and axillary nodes normal. Resp: clear to auscultation bilaterally Back: symmetric, no curvature. ROM normal. No CVA tenderness. Cardio: regular rate and rhythm, S1, S2 normal, no murmur, click, rub or gallop GI: soft, non-tender; bowel sounds normal; no masses,  no organomegaly Extremities: extremities normal, atraumatic, no cyanosis or edema Neurologic: Alert and oriented X 3, normal strength and tone. Normal symmetric reflexes. Normal coordination and gait  ECOG PERFORMANCE STATUS: 1 - Symptomatic but completely ambulatory  Blood pressure 134/74, pulse 70, temperature 97.5 F (36.4 C), temperature source Oral, resp. rate 18, height 5' 11"  (1.803 m), weight 173 lb (78.472 kg), SpO2 100 %.  LABORATORY DATA: Lab Results  Component Value Date   WBC 2.6* 01/10/2014   HGB 9.3* 01/10/2014   HCT 29.7* 01/10/2014   MCV 77.2* 01/10/2014   PLT 164 01/10/2014      Chemistry      Component Value Date/Time   NA 139 01/10/2014 0838   NA 139 08/30/2013 0935   K 4.1 01/10/2014 0838   K 4.1 08/30/2013 0935   CL 101 08/30/2013 0935   CL 103 07/12/2012 0907   CO2 26 01/10/2014 0838   CO2 26  08/30/2013 0935   BUN 12.0 01/10/2014 0838   BUN 11 08/30/2013 0935   CREATININE 1.1 01/10/2014 0838   CREATININE 1.03 08/30/2013 0935      Component Value Date/Time   CALCIUM 9.3 01/10/2014 0838   CALCIUM 9.2 08/30/2013 0935   ALKPHOS 39* 01/10/2014 0838   ALKPHOS 39 08/30/2013 0935   AST 17 01/10/2014 0838   AST 13 08/30/2013 0935   ALT 13 01/10/2014 0838   ALT 9 08/30/2013 0935   BILITOT 1.28* 01/10/2014 0838   BILITOT 1.3* 08/30/2013 0935     Other lab results:  Beta-2 microglobulin 3.13, free Light chain 0.03, free lambda light chain 10.60 with a kappa/lambda ratio 0.00. IgG 121, IgA 1300 and IgM less than 4. RADIOGRAPHIC STUDIES: No results found.  ASSESSMENT AND PLAN: This is a very pleasant 68 years old [place] American male with history of multiple myeloma currently undergoing systemic chemotherapy with Carfilzomib, cyclophosphamide and Decadron status post 16 cycles.  The patient is feeling fine and tolerating his treatment fairly well. The patient myeloma panel showed mild but no significant evidence for disease progression. I discussed the lab results and give a copy of the report to the  patient today. I recommended for him to continue his current treatment with Carfilzomib, Cytoxan and Decadron as a scheduled. He will start cycle #17 today. He would come back for followup visit in 1 month for reevaluation after repeating CBC and comprehensive metabolic panel. He'll continue on Zometa every 2 months. For the bilateral hip pain, the patient will be seen by orthopedic surgery for consideration of steroid injection. He was advised to call immediately if he has any concerning symptoms in the interval.  The patient voices understanding of current disease status and treatment options and is in agreement with the current care plan. All questions were answered. The patient knows to call the clinic with any problems, questions or concerns. We can certainly see the patient much  sooner if necessary.  Disclaimer: This note was dictated with voice recognition software. Similar sounding words can inadvertently be transcribed and may not be corrected upon review.

## 2014-01-10 NOTE — Patient Instructions (Signed)
[facility] Discharge Instructions for Patients Receiving Chemotherapy  Today you received the following chemotherapy agents Cytoxan adn Jyprolis To help prevent nausea and vomiting after your treatment, we encourage you to take your nausea medication as prescribed.   If you develop nausea and vomiting that is not controlled by your nausea medication, call the clinic.   BELOW ARE SYMPTOMS THAT SHOULD BE REPORTED IMMEDIATELY:  *FEVER GREATER THAN 100.5 F  *CHILLS WITH OR WITHOUT FEVER  NAUSEA AND VOMITING THAT IS NOT CONTROLLED WITH YOUR NAUSEA MEDICATION  *UNUSUAL SHORTNESS OF BREATH  *UNUSUAL BRUISING OR BLEEDING  TENDERNESS IN MOUTH AND THROAT WITH OR WITHOUT PRESENCE OF ULCERS  *URINARY PROBLEMS  *BOWEL PROBLEMS  UNUSUAL RASH Items with * indicate a potential emergency and should be followed up as soon as possible.  Feel free to call the clinic you have any questions or concerns. The clinic phone number is [phone].

## 2014-01-10 NOTE — Telephone Encounter (Signed)
Gave avs & cal for Jan. Sent mess to sch tx. °

## 2014-01-11 ENCOUNTER — Telehealth: Payer: Self-pay | Admitting: *Deleted

## 2014-01-11 ENCOUNTER — Ambulatory Visit (HOSPITAL_BASED_OUTPATIENT_CLINIC_OR_DEPARTMENT_OTHER): Payer: Medicare Other

## 2014-01-11 DIAGNOSIS — C9000 Multiple myeloma not having achieved remission: Secondary | ICD-10-CM

## 2014-01-11 DIAGNOSIS — Z5112 Encounter for antineoplastic immunotherapy: Secondary | ICD-10-CM

## 2014-01-11 MED ORDER — DEXAMETHASONE SODIUM PHOSPHATE 10 MG/ML IJ SOLN
INTRAMUSCULAR | Status: AC
  Filled 2014-01-11: qty 1

## 2014-01-11 MED ORDER — DEXTROSE 5 % IV SOLN
27.0000 mg/m2 | Freq: Once | INTRAVENOUS | Status: AC
  Administered 2014-01-11: 54 mg via INTRAVENOUS
  Filled 2014-01-11: qty 27

## 2014-01-11 MED ORDER — ONDANSETRON 8 MG/NS 50 ML IVPB
INTRAVENOUS | Status: AC
  Filled 2014-01-11: qty 8

## 2014-01-11 MED ORDER — HEPARIN SOD (PORK) LOCK FLUSH 100 UNIT/ML IV SOLN
500.0000 [IU] | Freq: Once | INTRAVENOUS | Status: AC | PRN
  Administered 2014-01-11: 500 [IU]
  Filled 2014-01-11: qty 5

## 2014-01-11 MED ORDER — SODIUM CHLORIDE 0.9 % IJ SOLN
10.0000 mL | INTRAMUSCULAR | Status: DC | PRN
  Administered 2014-01-11: 10 mL
  Filled 2014-01-11: qty 10

## 2014-01-11 MED ORDER — DEXAMETHASONE SODIUM PHOSPHATE 10 MG/ML IJ SOLN
10.0000 mg | Freq: Once | INTRAMUSCULAR | Status: AC
  Administered 2014-01-11: 10 mg via INTRAVENOUS

## 2014-01-11 MED ORDER — SODIUM CHLORIDE 0.9 % IV SOLN: Freq: Once | INTRAVENOUS | Status: DC

## 2014-01-11 MED ORDER — ONDANSETRON 8 MG/50ML IVPB (CHCC)
8.0000 mg | Freq: Once | INTRAVENOUS | Status: AC
  Administered 2014-01-11: 8 mg via INTRAVENOUS

## 2014-01-11 MED ORDER — ZOLEDRONIC ACID 4 MG/100ML IV SOLN
4.0000 mg | Freq: Once | INTRAVENOUS | Status: AC
Start: 2014-01-11 — End: 2014-01-11
  Administered 2014-01-11: 4 mg via INTRAVENOUS
  Filled 2014-01-11: qty 100

## 2014-01-11 MED ORDER — SODIUM CHLORIDE 0.9 % IV SOLN
Freq: Once | INTRAVENOUS | Status: AC
  Administered 2014-01-11: 15:00:00 via INTRAVENOUS

## 2014-01-11 NOTE — Patient Instructions (Addendum)
[facility] Discharge Instructions for Patients Receiving Chemotherapy  Today you received the following chemotherapy agents Kyprolis.  To help prevent nausea and vomiting after your treatment, we encourage you to take your nausea medication as prescribed.   If you develop nausea and vomiting that is not controlled by your nausea medication, call the clinic.   BELOW ARE SYMPTOMS THAT SHOULD BE REPORTED IMMEDIATELY:  *FEVER GREATER THAN 100.5 F  *CHILLS WITH OR WITHOUT FEVER  NAUSEA AND VOMITING THAT IS NOT CONTROLLED WITH YOUR NAUSEA MEDICATION  *UNUSUAL SHORTNESS OF BREATH  *UNUSUAL BRUISING OR BLEEDING  TENDERNESS IN MOUTH AND THROAT WITH OR WITHOUT PRESENCE OF ULCERS  *URINARY PROBLEMS  *BOWEL PROBLEMS  UNUSUAL RASH Items with * indicate a potential emergency and should be followed up as soon as possible.  Feel free to call the clinic you have any questions or concerns. The clinic phone number is [phone].   Zoledronic Acid injection (Hypercalcemia, Oncology) What is this medicine? ZOLEDRONIC ACID (ZOE le dron ik AS id) lowers the amount of calcium loss from bone. It is used to treat too much calcium in your blood from cancer. It is also used to prevent complications of cancer that has spread to the bone. This medicine may be used for other purposes; ask your health care provider or pharmacist if you have questions. COMMON BRAND NAME(S): Zometa What should I tell my health care provider before I take this medicine? They need to know if you have any of these conditions: -aspirin-sensitive asthma -cancer, especially if you are receiving medicines used to treat cancer -dental disease or wear dentures -infection -kidney disease -receiving corticosteroids like dexamethasone or prednisone -an unusual or allergic reaction to zoledronic acid, other medicines, foods, dyes, or preservatives -pregnant or trying to get pregnant -breast-feeding How should I  use this medicine? This medicine is for infusion into a vein. It is given by a health care professional in a hospital or clinic setting. Talk to your pediatrician regarding the use of this medicine in children. Special care may be needed. Overdosage: If you think you have taken too much of this medicine contact a poison control center or emergency room at once. NOTE: This medicine is only for you. Do not share this medicine with others. What if I miss a dose? It is important not to miss your dose. Call your doctor or health care professional if you are unable to keep an appointment. What may interact with this medicine? -certain antibiotics given by injection -NSAIDs, medicines for pain and inflammation, like ibuprofen or naproxen -some diuretics like bumetanide, furosemide -teriparatide -thalidomide This list may not describe all possible interactions. Give your health care provider a list of all the medicines, herbs, non-prescription drugs, or dietary supplements you use. Also tell them if you smoke, drink alcohol, or use illegal drugs. Some items may interact with your medicine. What should I watch for while using this medicine? Visit your doctor or health care professional for regular checkups. It may be some time before you see the benefit from this medicine. Do not stop taking your medicine unless your doctor tells you to. Your doctor may order blood tests or other tests to see how you are doing. Women should inform their doctor if they wish to become pregnant or think they might be pregnant. There is a potential for serious side effects to an unborn child. Talk to your health care professional or pharmacist for more information. You should make sure that you get   enough calcium and vitamin D while you are taking this medicine. Discuss the foods you eat and the vitamins you take with your health care professional. Some people who take this medicine have severe bone, joint, and/or muscle pain.  This medicine may also increase your risk for jaw problems or a broken thigh bone. Tell your doctor right away if you have severe pain in your jaw, bones, joints, or muscles. Tell your doctor if you have any pain that does not go away or that gets worse. Tell your dentist and dental surgeon that you are taking this medicine. You should not have major dental surgery while on this medicine. See your dentist to have a dental exam and fix any dental problems before starting this medicine. Take good care of your teeth while on this medicine. Make sure you see your dentist for regular follow-up appointments. What side effects may I notice from receiving this medicine? Side effects that you should report to your doctor or health care professional as soon as possible: -allergic reactions like skin rash, itching or hives, swelling of the face, lips, or tongue -anxiety, confusion, or depression -breathing problems -changes in vision -eye pain -feeling faint or lightheaded, falls -jaw pain, especially after dental work -mouth sores -muscle cramps, stiffness, or weakness -trouble passing urine or change in the amount of urine Side effects that usually do not require medical attention (report to your doctor or health care professional if they continue or are bothersome): -bone, joint, or muscle pain -constipation -diarrhea -fever -hair loss -irritation at site where injected -loss of appetite -nausea, vomiting -stomach upset -trouble sleeping -trouble swallowing -weak or tired This list may not describe all possible side effects. Call your doctor for medical advice about side effects. You may report side effects to FDA at 1-800-FDA-1088. Where should I keep my medicine? This drug is given in a hospital or clinic and will not be stored at home. NOTE: This sheet is a summary. It may not cover all possible information. If you have questions about this medicine, talk to your doctor, pharmacist, or  health care provider.  2015, [employer] Standard. (2012-06-17 13:03:13)  

## 2014-01-11 NOTE — Telephone Encounter (Signed)
Per staff message and POF I have scheduled appts. Advised scheduler of appts. JMW  

## 2014-01-17 ENCOUNTER — Other Ambulatory Visit (HOSPITAL_BASED_OUTPATIENT_CLINIC_OR_DEPARTMENT_OTHER): Payer: Medicare Other

## 2014-01-17 ENCOUNTER — Other Ambulatory Visit: Payer: Self-pay | Admitting: *Deleted

## 2014-01-17 ENCOUNTER — Ambulatory Visit (HOSPITAL_BASED_OUTPATIENT_CLINIC_OR_DEPARTMENT_OTHER): Payer: Medicare Other

## 2014-01-17 DIAGNOSIS — Z5112 Encounter for antineoplastic immunotherapy: Secondary | ICD-10-CM

## 2014-01-17 DIAGNOSIS — C9000 Multiple myeloma not having achieved remission: Secondary | ICD-10-CM

## 2014-01-17 DIAGNOSIS — Z5111 Encounter for antineoplastic chemotherapy: Secondary | ICD-10-CM

## 2014-01-17 LAB — COMPREHENSIVE METABOLIC PANEL (CC13)
ALK PHOS: 40 U/L (ref 40–150)
ALT: 8 U/L (ref 0–55)
AST: 15 U/L (ref 5–34)
Albumin: 3.5 g/dL (ref 3.5–5.0)
Anion Gap: 8 mEq/L (ref 3–11)
BILIRUBIN TOTAL: 0.98 mg/dL (ref 0.20–1.20)
BUN: 15.9 mg/dL (ref 7.0–26.0)
CO2: 26 mEq/L (ref 22–29)
Calcium: 8.9 mg/dL (ref 8.4–10.4)
Chloride: 104 mEq/L (ref 98–109)
Creatinine: 1.1 mg/dL (ref 0.7–1.3)
EGFR: 76 mL/min/{1.73_m2} — ABNORMAL LOW (ref >90)
Glucose: 156 mg/dl — ABNORMAL HIGH (ref 70–140)
Potassium: 4.3 mEq/L (ref 3.5–5.1)
SODIUM: 138 meq/L (ref 136–145)
Total Protein: 6.8 g/dL (ref 6.4–8.3)

## 2014-01-17 LAB — TECHNOLOGIST REVIEW

## 2014-01-17 LAB — CBC WITH DIFFERENTIAL/PLATELET
BASO%: 0.3 % (ref 0.0–2.0)
BASOS ABS: 0 10*3/uL (ref 0.0–0.1)
EOS%: 1 % (ref 0.0–7.0)
Eosinophils Absolute: 0 10*3/uL (ref 0.0–0.5)
HEMATOCRIT: 28.6 % — AB (ref 38.4–49.9)
HGB: 8.9 g/dL — ABNORMAL LOW (ref 13.0–17.1)
LYMPH#: 0.5 10*3/uL — AB (ref 0.9–3.3)
LYMPH%: 13.2 % — ABNORMAL LOW (ref 14.0–49.0)
MCH: 24.3 pg — AB (ref 27.2–33.4)
MCHC: 31.2 g/dL — ABNORMAL LOW (ref 32.0–36.0)
MCV: 78 fL — ABNORMAL LOW (ref 79.3–98.0)
MONO#: 0.4 10*3/uL (ref 0.1–0.9)
MONO%: 10.2 % (ref 0.0–14.0)
NEUT%: 75.3 % — ABNORMAL HIGH (ref 39.0–75.0)
NEUTROS ABS: 2.6 10*3/uL (ref 1.5–6.5)
Platelets: 111 10*3/uL — ABNORMAL LOW (ref 140–400)
RBC: 3.67 10*6/uL — ABNORMAL LOW (ref 4.20–5.82)
RDW: 15.5 % — AB (ref 11.0–14.6)
WBC: 3.5 10*3/uL — ABNORMAL LOW (ref 4.0–10.3)

## 2014-01-17 MED ORDER — SODIUM CHLORIDE 0.9 % IV SOLN: Freq: Once | INTRAVENOUS | Status: DC

## 2014-01-17 MED ORDER — DEXTROSE 5 % IV SOLN
27.0000 mg/m2 | Freq: Once | INTRAVENOUS | Status: AC
  Administered 2014-01-17: 54 mg via INTRAVENOUS
  Filled 2014-01-17: qty 27

## 2014-01-17 MED ORDER — DEXAMETHASONE SODIUM PHOSPHATE 10 MG/ML IJ SOLN
10.0000 mg | Freq: Once | INTRAMUSCULAR | Status: AC
  Administered 2014-01-17: 10 mg via INTRAVENOUS

## 2014-01-17 MED ORDER — SODIUM CHLORIDE 0.9 % IV SOLN
Freq: Once | INTRAVENOUS | Status: AC
  Administered 2014-01-17: 09:00:00 via INTRAVENOUS

## 2014-01-17 MED ORDER — ONDANSETRON 8 MG/NS 50 ML IVPB
INTRAVENOUS | Status: AC
  Filled 2014-01-17: qty 8

## 2014-01-17 MED ORDER — HEPARIN SOD (PORK) LOCK FLUSH 100 UNIT/ML IV SOLN
500.0000 [IU] | Freq: Once | INTRAVENOUS | Status: AC | PRN
  Administered 2014-01-17: 500 [IU]
  Filled 2014-01-17: qty 5

## 2014-01-17 MED ORDER — CYCLOPHOSPHAMIDE CHEMO INJECTION 1 GM
300.0000 mg/m2 | Freq: Once | INTRAMUSCULAR | Status: AC
  Administered 2014-01-17: 600 mg via INTRAVENOUS
  Filled 2014-01-17: qty 30

## 2014-01-17 MED ORDER — DEXAMETHASONE SODIUM PHOSPHATE 10 MG/ML IJ SOLN
INTRAMUSCULAR | Status: AC
  Filled 2014-01-17: qty 1

## 2014-01-17 MED ORDER — ONDANSETRON 8 MG/50ML IVPB (CHCC)
8.0000 mg | Freq: Once | INTRAVENOUS | Status: AC
  Administered 2014-01-17: 8 mg via INTRAVENOUS

## 2014-01-17 MED ORDER — SODIUM CHLORIDE 0.9 % IJ SOLN
10.0000 mL | INTRAMUSCULAR | Status: DC | PRN
  Administered 2014-01-17: 10 mL
  Filled 2014-01-17: qty 10

## 2014-01-17 NOTE — Progress Notes (Signed)
Ok to proceed with treatment without waiting for labs per MD [facility].

## 2014-01-17 NOTE — Patient Instructions (Signed)
[place] Discharge Instructions for Patients Receiving Chemotherapy  Today you received the following chemotherapy agents Kyprolis/Cytoxan.   To help prevent nausea and vomiting after your treatment, we encourage you to take your nausea medication as directed.    If you develop nausea and vomiting that is not controlled by your nausea medication, call the clinic.   BELOW ARE SYMPTOMS THAT SHOULD BE REPORTED IMMEDIATELY:  *FEVER GREATER THAN 100.5 F  *CHILLS WITH OR WITHOUT FEVER  NAUSEA AND VOMITING THAT IS NOT CONTROLLED WITH YOUR NAUSEA MEDICATION  *UNUSUAL SHORTNESS OF BREATH  *UNUSUAL BRUISING OR BLEEDING  TENDERNESS IN MOUTH AND THROAT WITH OR WITHOUT PRESENCE OF ULCERS  *URINARY PROBLEMS  *BOWEL PROBLEMS  UNUSUAL RASH Items with * indicate a potential emergency and should be followed up as soon as possible.  Feel free to call the clinic you have any questions or concerns. The clinic phone number is [phone].

## 2014-01-18 ENCOUNTER — Ambulatory Visit (HOSPITAL_BASED_OUTPATIENT_CLINIC_OR_DEPARTMENT_OTHER): Payer: Medicare Other

## 2014-01-18 DIAGNOSIS — Z5112 Encounter for antineoplastic immunotherapy: Secondary | ICD-10-CM

## 2014-01-18 DIAGNOSIS — C9000 Multiple myeloma not having achieved remission: Secondary | ICD-10-CM

## 2014-01-18 MED ORDER — SODIUM CHLORIDE 0.9 % IV SOLN
Freq: Once | INTRAVENOUS | Status: AC
  Administered 2014-01-18: 09:00:00 via INTRAVENOUS

## 2014-01-18 MED ORDER — HEPARIN SOD (PORK) LOCK FLUSH 100 UNIT/ML IV SOLN
500.0000 [IU] | Freq: Once | INTRAVENOUS | Status: DC | PRN
  Filled 2014-01-18: qty 5

## 2014-01-18 MED ORDER — ONDANSETRON 8 MG/NS 50 ML IVPB
INTRAVENOUS | Status: AC
Start: 2014-01-18 — End: 2014-01-18
  Filled 2014-01-18: qty 8

## 2014-01-18 MED ORDER — SODIUM CHLORIDE 0.9 % IJ SOLN
10.0000 mL | INTRAMUSCULAR | Status: DC | PRN
  Administered 2014-01-18: 250 mL
  Filled 2014-01-18: qty 10

## 2014-01-18 MED ORDER — DEXAMETHASONE SODIUM PHOSPHATE 10 MG/ML IJ SOLN
INTRAMUSCULAR | Status: AC
  Filled 2014-01-18: qty 1

## 2014-01-18 MED ORDER — ONDANSETRON 8 MG/50ML IVPB (CHCC)
8.0000 mg | Freq: Once | INTRAVENOUS | Status: AC
  Administered 2014-01-18: 8 mg via INTRAVENOUS

## 2014-01-18 MED ORDER — DEXAMETHASONE SODIUM PHOSPHATE 10 MG/ML IJ SOLN
10.0000 mg | Freq: Once | INTRAMUSCULAR | Status: AC
  Administered 2014-01-18: 10 mg via INTRAVENOUS

## 2014-01-18 MED ORDER — DEXTROSE 5 % IV SOLN
27.0000 mg/m2 | Freq: Once | INTRAVENOUS | Status: AC
  Administered 2014-01-18: 54 mg via INTRAVENOUS
  Filled 2014-01-18: qty 27

## 2014-01-18 NOTE — Patient Instructions (Signed)
[facility] Discharge Instructions for Patients Receiving Chemotherapy  Today you received the following chemotherapy agents Kyprolis  To help prevent nausea and vomiting after your treatment, we encourage you to take your nausea medication Compazine 10 mg every 6 hours as needed. If you develop nausea and vomiting that is not controlled by your nausea medication, call the clinic.   BELOW ARE SYMPTOMS THAT SHOULD BE REPORTED IMMEDIATELY:  *FEVER GREATER THAN 100.5 F  *CHILLS WITH OR WITHOUT FEVER  NAUSEA AND VOMITING THAT IS NOT CONTROLLED WITH YOUR NAUSEA MEDICATION  *UNUSUAL SHORTNESS OF BREATH  *UNUSUAL BRUISING OR BLEEDING  TENDERNESS IN MOUTH AND THROAT WITH OR WITHOUT PRESENCE OF ULCERS  *URINARY PROBLEMS  *BOWEL PROBLEMS  UNUSUAL RASH Items with * indicate a potential emergency and should be followed up as soon as possible.  Feel free to call the clinic you have any questions or concerns. The clinic phone number is [phone].    

## 2014-01-24 ENCOUNTER — Other Ambulatory Visit: Payer: Self-pay | Admitting: Internal Medicine

## 2014-01-24 ENCOUNTER — Other Ambulatory Visit (HOSPITAL_BASED_OUTPATIENT_CLINIC_OR_DEPARTMENT_OTHER): Payer: Medicare Other

## 2014-01-24 ENCOUNTER — Ambulatory Visit (HOSPITAL_BASED_OUTPATIENT_CLINIC_OR_DEPARTMENT_OTHER): Payer: Medicare Other

## 2014-01-24 DIAGNOSIS — C9000 Multiple myeloma not having achieved remission: Secondary | ICD-10-CM

## 2014-01-24 DIAGNOSIS — C9002 Multiple myeloma in relapse: Secondary | ICD-10-CM

## 2014-01-24 DIAGNOSIS — Z5111 Encounter for antineoplastic chemotherapy: Secondary | ICD-10-CM

## 2014-01-24 DIAGNOSIS — Z5112 Encounter for antineoplastic immunotherapy: Secondary | ICD-10-CM

## 2014-01-24 LAB — CBC WITH DIFFERENTIAL/PLATELET
BASO%: 0.4 % (ref 0.0–2.0)
BASOS ABS: 0 10*3/uL (ref 0.0–0.1)
EOS%: 0.7 % (ref 0.0–7.0)
Eosinophils Absolute: 0 10*3/uL (ref 0.0–0.5)
HEMATOCRIT: 28.1 % — AB (ref 38.4–49.9)
HEMOGLOBIN: 8.6 g/dL — AB (ref 13.0–17.1)
LYMPH%: 9.1 % — AB (ref 14.0–49.0)
MCH: 23.8 pg — AB (ref 27.2–33.4)
MCHC: 30.6 g/dL — ABNORMAL LOW (ref 32.0–36.0)
MCV: 77.8 fL — ABNORMAL LOW (ref 79.3–98.0)
MONO#: 0.3 10*3/uL (ref 0.1–0.9)
MONO%: 6.9 % (ref 0.0–14.0)
NEUT%: 82.9 % — ABNORMAL HIGH (ref 39.0–75.0)
NEUTROS ABS: 3.4 10*3/uL (ref 1.5–6.5)
Platelets: 120 10*3/uL — ABNORMAL LOW (ref 140–400)
RBC: 3.61 10*6/uL — ABNORMAL LOW (ref 4.20–5.82)
RDW: 15.7 % — AB (ref 11.0–14.6)
WBC: 4.1 10*3/uL (ref 4.0–10.3)
lymph#: 0.4 10*3/uL — ABNORMAL LOW (ref 0.9–3.3)

## 2014-01-24 LAB — COMPREHENSIVE METABOLIC PANEL (CC13)
ALBUMIN: 3.5 g/dL (ref 3.5–5.0)
ALT: 9 U/L (ref 0–55)
ANION GAP: 5 meq/L (ref 3–11)
AST: 15 U/L (ref 5–34)
Alkaline Phosphatase: 37 U/L — ABNORMAL LOW (ref 40–150)
BUN: 13.6 mg/dL (ref 7.0–26.0)
CALCIUM: 9 mg/dL (ref 8.4–10.4)
CO2: 26 meq/L (ref 22–29)
CREATININE: 1 mg/dL (ref 0.7–1.3)
Chloride: 104 mEq/L (ref 98–109)
EGFR: 87 mL/min/{1.73_m2} — ABNORMAL LOW (ref >90)
Glucose: 146 mg/dl — ABNORMAL HIGH (ref 70–140)
POTASSIUM: 4.2 meq/L (ref 3.5–5.1)
Sodium: 135 mEq/L — ABNORMAL LOW (ref 136–145)
Total Bilirubin: 1.13 mg/dL (ref 0.20–1.20)
Total Protein: 6.6 g/dL (ref 6.4–8.3)

## 2014-01-24 MED ORDER — HEPARIN SOD (PORK) LOCK FLUSH 100 UNIT/ML IV SOLN
500.0000 [IU] | Freq: Once | INTRAVENOUS | Status: AC | PRN
  Administered 2014-01-24: 500 [IU]
  Filled 2014-01-24: qty 5

## 2014-01-24 MED ORDER — DEXAMETHASONE SODIUM PHOSPHATE 10 MG/ML IJ SOLN
INTRAMUSCULAR | Status: AC
  Filled 2014-01-24: qty 1

## 2014-01-24 MED ORDER — SODIUM CHLORIDE 0.9 % IV SOLN: Freq: Once | INTRAVENOUS | Status: DC

## 2014-01-24 MED ORDER — DEXAMETHASONE SODIUM PHOSPHATE 10 MG/ML IJ SOLN
10.0000 mg | Freq: Once | INTRAMUSCULAR | Status: AC
  Administered 2014-01-24: 10 mg via INTRAVENOUS

## 2014-01-24 MED ORDER — ONDANSETRON 8 MG/NS 50 ML IVPB
INTRAVENOUS | Status: AC
  Filled 2014-01-24: qty 8

## 2014-01-24 MED ORDER — DEXTROSE 5 % IV SOLN
27.0000 mg/m2 | Freq: Once | INTRAVENOUS | Status: AC
  Administered 2014-01-24: 54 mg via INTRAVENOUS
  Filled 2014-01-24: qty 27

## 2014-01-24 MED ORDER — SODIUM CHLORIDE 0.9 % IV SOLN
300.0000 mg/m2 | Freq: Once | INTRAVENOUS | Status: AC
  Administered 2014-01-24: 600 mg via INTRAVENOUS
  Filled 2014-01-24: qty 30

## 2014-01-24 MED ORDER — ONDANSETRON 8 MG/50ML IVPB (CHCC)
8.0000 mg | Freq: Once | INTRAVENOUS | Status: AC
  Administered 2014-01-24: 8 mg via INTRAVENOUS

## 2014-01-24 MED ORDER — SODIUM CHLORIDE 0.9 % IJ SOLN
10.0000 mL | INTRAMUSCULAR | Status: DC | PRN
  Administered 2014-01-24: 10 mL
  Filled 2014-01-24: qty 10

## 2014-01-24 MED ORDER — SODIUM CHLORIDE 0.9 % IV SOLN
Freq: Once | INTRAVENOUS | Status: AC
  Administered 2014-01-24: 09:00:00 via INTRAVENOUS

## 2014-01-24 NOTE — Patient Instructions (Signed)
[facility] Discharge Instructions for Patients Receiving Chemotherapy  Today you received the following chemotherapy agents Cytoxan and Kyprolis.  To help prevent nausea and vomiting after your treatment, we encourage you to take your nausea medication as prescribed.   If you develop nausea and vomiting that is not controlled by your nausea medication, call the clinic.   BELOW ARE SYMPTOMS THAT SHOULD BE REPORTED IMMEDIATELY:  *FEVER GREATER THAN 100.5 F  *CHILLS WITH OR WITHOUT FEVER  NAUSEA AND VOMITING THAT IS NOT CONTROLLED WITH YOUR NAUSEA MEDICATION  *UNUSUAL SHORTNESS OF BREATH  *UNUSUAL BRUISING OR BLEEDING  TENDERNESS IN MOUTH AND THROAT WITH OR WITHOUT PRESENCE OF ULCERS  *URINARY PROBLEMS  *BOWEL PROBLEMS  UNUSUAL RASH Items with * indicate a potential emergency and should be followed up as soon as possible.  Feel free to call the clinic you have any questions or concerns. The clinic phone number is [phone].    

## 2014-01-25 ENCOUNTER — Ambulatory Visit (HOSPITAL_BASED_OUTPATIENT_CLINIC_OR_DEPARTMENT_OTHER): Payer: Medicare Other

## 2014-01-25 DIAGNOSIS — Z5112 Encounter for antineoplastic immunotherapy: Secondary | ICD-10-CM

## 2014-01-25 DIAGNOSIS — C9000 Multiple myeloma not having achieved remission: Secondary | ICD-10-CM

## 2014-01-25 MED ORDER — SODIUM CHLORIDE 0.9 % IV SOLN
Freq: Once | INTRAVENOUS | Status: AC
  Administered 2014-01-25: 08:00:00 via INTRAVENOUS

## 2014-01-25 MED ORDER — DEXAMETHASONE SODIUM PHOSPHATE 10 MG/ML IJ SOLN
10.0000 mg | Freq: Once | INTRAMUSCULAR | Status: AC
  Administered 2014-01-25: 10 mg via INTRAVENOUS

## 2014-01-25 MED ORDER — ONDANSETRON 8 MG/50ML IVPB (CHCC)
8.0000 mg | Freq: Once | INTRAVENOUS | Status: AC
  Administered 2014-01-25: 8 mg via INTRAVENOUS

## 2014-01-25 MED ORDER — DEXAMETHASONE SODIUM PHOSPHATE 10 MG/ML IJ SOLN
INTRAMUSCULAR | Status: AC
  Filled 2014-01-25: qty 1

## 2014-01-25 MED ORDER — ONDANSETRON 8 MG/NS 50 ML IVPB
INTRAVENOUS | Status: AC
  Filled 2014-01-25: qty 8

## 2014-01-25 MED ORDER — CARFILZOMIB CHEMO INJECTION 60 MG
27.0000 mg/m2 | Freq: Once | INTRAVENOUS | Status: AC
  Administered 2014-01-25: 54 mg via INTRAVENOUS
  Filled 2014-01-25: qty 27

## 2014-01-25 MED ORDER — SODIUM CHLORIDE 0.9 % IJ SOLN
10.0000 mL | INTRAMUSCULAR | Status: DC | PRN
  Administered 2014-01-25: 10 mL
  Filled 2014-01-25: qty 10

## 2014-01-25 MED ORDER — HEPARIN SOD (PORK) LOCK FLUSH 100 UNIT/ML IV SOLN
500.0000 [IU] | Freq: Once | INTRAVENOUS | Status: AC | PRN
  Administered 2014-01-25: 500 [IU]
  Filled 2014-01-25: qty 5

## 2014-01-25 NOTE — Patient Instructions (Signed)
[facility] Discharge Instructions for Patients Receiving Chemotherapy  Today you received the following chemotherapy agents Kyprolis To help prevent nausea and vomiting after your treatment, we encourage you to take your nausea medication as prescribed.  If you develop nausea and vomiting that is not controlled by your nausea medication, call the clinic.   BELOW ARE SYMPTOMS THAT SHOULD BE REPORTED IMMEDIATELY:  *FEVER GREATER THAN 100.5 F  *CHILLS WITH OR WITHOUT FEVER  NAUSEA AND VOMITING THAT IS NOT CONTROLLED WITH YOUR NAUSEA MEDICATION  *UNUSUAL SHORTNESS OF BREATH  *UNUSUAL BRUISING OR BLEEDING  TENDERNESS IN MOUTH AND THROAT WITH OR WITHOUT PRESENCE OF ULCERS  *URINARY PROBLEMS  *BOWEL PROBLEMS  UNUSUAL RASH Items with * indicate a potential emergency and should be followed up as soon as possible.  Feel free to call the clinic you have any questions or concerns. The clinic phone number is [phone].    

## 2014-01-31 ENCOUNTER — Other Ambulatory Visit (HOSPITAL_BASED_OUTPATIENT_CLINIC_OR_DEPARTMENT_OTHER): Payer: Medicare Other

## 2014-01-31 DIAGNOSIS — C9000 Multiple myeloma not having achieved remission: Secondary | ICD-10-CM

## 2014-01-31 DIAGNOSIS — C9002 Multiple myeloma in relapse: Secondary | ICD-10-CM

## 2014-01-31 LAB — COMPREHENSIVE METABOLIC PANEL (CC13)
ALT: 11 U/L (ref 0–55)
ANION GAP: 8 meq/L (ref 3–11)
AST: 14 U/L (ref 5–34)
Albumin: 3.3 g/dL — ABNORMAL LOW (ref 3.5–5.0)
Alkaline Phosphatase: 38 U/L — ABNORMAL LOW (ref 40–150)
BUN: 11.8 mg/dL (ref 7.0–26.0)
CO2: 26 mEq/L (ref 22–29)
CREATININE: 0.9 mg/dL (ref 0.7–1.3)
Calcium: 8.2 mg/dL — ABNORMAL LOW (ref 8.4–10.4)
Chloride: 106 mEq/L (ref 98–109)
EGFR: >90 mL/min/{1.73_m2} (ref >90)
Glucose: 93 mg/dl (ref 70–140)
POTASSIUM: 3.7 meq/L (ref 3.5–5.1)
SODIUM: 141 meq/L (ref 136–145)
Total Bilirubin: 0.99 mg/dL (ref 0.20–1.20)
Total Protein: 6.3 g/dL — ABNORMAL LOW (ref 6.4–8.3)

## 2014-01-31 LAB — CBC WITH DIFFERENTIAL/PLATELET
BASO%: 0.3 % (ref 0.0–2.0)
BASOS ABS: 0 10*3/uL (ref 0.0–0.1)
EOS%: 0.9 % (ref 0.0–7.0)
Eosinophils Absolute: 0 10*3/uL (ref 0.0–0.5)
HCT: 26.8 % — ABNORMAL LOW (ref 38.4–49.9)
HGB: 8.5 g/dL — ABNORMAL LOW (ref 13.0–17.1)
LYMPH%: 14.7 % (ref 14.0–49.0)
MCH: 24.4 pg — ABNORMAL LOW (ref 27.2–33.4)
MCHC: 31.7 g/dL — ABNORMAL LOW (ref 32.0–36.0)
MCV: 76.9 fL — ABNORMAL LOW (ref 79.3–98.0)
MONO#: 0.4 10*3/uL (ref 0.1–0.9)
MONO%: 11.2 % (ref 0.0–14.0)
NEUT#: 2.3 10*3/uL (ref 1.5–6.5)
NEUT%: 72.9 % (ref 39.0–75.0)
Platelets: 142 10*3/uL (ref 140–400)
RBC: 3.49 10*6/uL — ABNORMAL LOW (ref 4.20–5.82)
RDW: 15.6 % — AB (ref 11.0–14.6)
WBC: 3.2 10*3/uL — ABNORMAL LOW (ref 4.0–10.3)
lymph#: 0.5 10*3/uL — ABNORMAL LOW (ref 0.9–3.3)

## 2014-02-07 ENCOUNTER — Ambulatory Visit (HOSPITAL_BASED_OUTPATIENT_CLINIC_OR_DEPARTMENT_OTHER): Payer: Medicare Other

## 2014-02-07 ENCOUNTER — Other Ambulatory Visit: Payer: Self-pay | Admitting: Physician Assistant

## 2014-02-07 ENCOUNTER — Encounter: Payer: Self-pay | Admitting: Physician Assistant

## 2014-02-07 ENCOUNTER — Ambulatory Visit (HOSPITAL_BASED_OUTPATIENT_CLINIC_OR_DEPARTMENT_OTHER): Payer: Medicare Other | Admitting: Physician Assistant

## 2014-02-07 ENCOUNTER — Telehealth: Payer: Self-pay | Admitting: Physician Assistant

## 2014-02-07 ENCOUNTER — Other Ambulatory Visit (HOSPITAL_BASED_OUTPATIENT_CLINIC_OR_DEPARTMENT_OTHER): Payer: Medicare Other

## 2014-02-07 VITALS — BP 163/75 | HR 66 | Temp 97.5°F | Resp 18 | Ht 71.0 in | Wt 177.8 lb

## 2014-02-07 DIAGNOSIS — C9002 Multiple myeloma in relapse: Secondary | ICD-10-CM

## 2014-02-07 DIAGNOSIS — Z5112 Encounter for antineoplastic immunotherapy: Secondary | ICD-10-CM

## 2014-02-07 DIAGNOSIS — C9000 Multiple myeloma not having achieved remission: Secondary | ICD-10-CM | POA: Diagnosis not present

## 2014-02-07 DIAGNOSIS — N644 Mastodynia: Secondary | ICD-10-CM | POA: Diagnosis not present

## 2014-02-07 DIAGNOSIS — N6320 Unspecified lump in the left breast, unspecified quadrant: Secondary | ICD-10-CM

## 2014-02-07 LAB — COMPREHENSIVE METABOLIC PANEL (CC13)
ALBUMIN: 3.5 g/dL (ref 3.5–5.0)
ALT: 11 U/L (ref 0–55)
AST: 18 U/L (ref 5–34)
Alkaline Phosphatase: 39 U/L — ABNORMAL LOW (ref 40–150)
Anion Gap: 10 mEq/L (ref 3–11)
BUN: 8 mg/dL (ref 7.0–26.0)
CO2: 25 mEq/L (ref 22–29)
Calcium: 9 mg/dL (ref 8.4–10.4)
Chloride: 105 mEq/L (ref 98–109)
Creatinine: 1 mg/dL (ref 0.7–1.3)
GLUCOSE: 107 mg/dL (ref 70–140)
POTASSIUM: 3.8 meq/L (ref 3.5–5.1)
Sodium: 140 mEq/L (ref 136–145)
TOTAL PROTEIN: 6.7 g/dL (ref 6.4–8.3)
Total Bilirubin: 1.08 mg/dL (ref 0.20–1.20)

## 2014-02-07 LAB — CBC WITH DIFFERENTIAL/PLATELET
BASO%: 0 % (ref 0.0–2.0)
Basophils Absolute: 0 10*3/uL (ref 0.0–0.1)
EOS ABS: 0 10*3/uL (ref 0.0–0.5)
EOS%: 1.1 % (ref 0.0–7.0)
HEMATOCRIT: 28.2 % — AB (ref 38.4–49.9)
HGB: 9 g/dL — ABNORMAL LOW (ref 13.0–17.1)
LYMPH#: 0.6 10*3/uL — AB (ref 0.9–3.3)
LYMPH%: 21.6 % (ref 14.0–49.0)
MCH: 24.5 pg — ABNORMAL LOW (ref 27.2–33.4)
MCHC: 31.9 g/dL — ABNORMAL LOW (ref 32.0–36.0)
MCV: 76.8 fL — ABNORMAL LOW (ref 79.3–98.0)
MONO#: 0.4 10*3/uL (ref 0.1–0.9)
MONO%: 12.9 % (ref 0.0–14.0)
NEUT%: 64.4 % (ref 39.0–75.0)
NEUTROS ABS: 1.8 10*3/uL (ref 1.5–6.5)
NRBC: 0 % (ref 0–0)
PLATELETS: 129 10*3/uL — AB (ref 140–400)
RBC: 3.67 10*6/uL — ABNORMAL LOW (ref 4.20–5.82)
RDW: 16.3 % — ABNORMAL HIGH (ref 11.0–14.6)
WBC: 2.8 10*3/uL — ABNORMAL LOW (ref 4.0–10.3)

## 2014-02-07 LAB — TECHNOLOGIST REVIEW

## 2014-02-07 MED ORDER — SODIUM CHLORIDE 0.9 % IV SOLN: Freq: Once | INTRAVENOUS | Status: DC

## 2014-02-07 MED ORDER — HEPARIN SOD (PORK) LOCK FLUSH 100 UNIT/ML IV SOLN
500.0000 [IU] | Freq: Once | INTRAVENOUS | Status: AC | PRN
  Administered 2014-02-07: 500 [IU]
  Filled 2014-02-07: qty 5

## 2014-02-07 MED ORDER — SODIUM CHLORIDE 0.9 % IV SOLN
300.0000 mg/m2 | Freq: Once | INTRAVENOUS | Status: AC
  Administered 2014-02-07: 600 mg via INTRAVENOUS
  Filled 2014-02-07: qty 30

## 2014-02-07 MED ORDER — DEXAMETHASONE SODIUM PHOSPHATE 10 MG/ML IJ SOLN
10.0000 mg | Freq: Once | INTRAMUSCULAR | Status: AC
  Administered 2014-02-07: 10 mg via INTRAVENOUS

## 2014-02-07 MED ORDER — ONDANSETRON 8 MG/50ML IVPB (CHCC)
8.0000 mg | Freq: Once | INTRAVENOUS | Status: AC
  Administered 2014-02-07: 8 mg via INTRAVENOUS

## 2014-02-07 MED ORDER — SODIUM CHLORIDE 0.9 % IJ SOLN
10.0000 mL | INTRAMUSCULAR | Status: DC | PRN
  Administered 2014-02-07: 10 mL
  Filled 2014-02-07: qty 10

## 2014-02-07 MED ORDER — DEXTROSE 5 % IV SOLN
27.0000 mg/m2 | Freq: Once | INTRAVENOUS | Status: AC
  Administered 2014-02-07: 54 mg via INTRAVENOUS
  Filled 2014-02-07: qty 27

## 2014-02-07 MED ORDER — ONDANSETRON 8 MG/NS 50 ML IVPB
INTRAVENOUS | Status: AC
  Filled 2014-02-07: qty 8

## 2014-02-07 MED ORDER — DEXAMETHASONE SODIUM PHOSPHATE 10 MG/ML IJ SOLN
INTRAMUSCULAR | Status: AC
  Filled 2014-02-07: qty 1

## 2014-02-07 MED ORDER — SODIUM CHLORIDE 0.9 % IV SOLN
Freq: Once | INTRAVENOUS | Status: AC
Start: 2014-02-07 — End: 2014-02-07
  Administered 2014-02-07: 11:00:00 via INTRAVENOUS

## 2014-02-07 NOTE — Patient Instructions (Signed)
[facility] Discharge Instructions for Patients Receiving Chemotherapy  Today you received the following chemotherapy agents Kyprolis and Cytoxan.  To help prevent nausea and vomiting after your treatment, we encourage you to take your nausea medication.   If you develop nausea and vomiting that is not controlled by your nausea medication, call the clinic.   BELOW ARE SYMPTOMS THAT SHOULD BE REPORTED IMMEDIATELY:  *FEVER GREATER THAN 100.5 F  *CHILLS WITH OR WITHOUT FEVER  NAUSEA AND VOMITING THAT IS NOT CONTROLLED WITH YOUR NAUSEA MEDICATION  *UNUSUAL SHORTNESS OF BREATH  *UNUSUAL BRUISING OR BLEEDING  TENDERNESS IN MOUTH AND THROAT WITH OR WITHOUT PRESENCE OF ULCERS  *URINARY PROBLEMS  *BOWEL PROBLEMS  UNUSUAL RASH Items with * indicate a potential emergency and should be followed up as soon as possible.  Feel free to call the clinic you have any questions or concerns. The clinic phone number is [phone].    

## 2014-02-07 NOTE — Progress Notes (Addendum)
[facility] [phone]   [phone]  OFFICE PROGRESS NOTE  [name] [address] 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   PRIOR THERAPY: :  1. Status post 6 cycles of systemic chemotherapy with Revlimid and Decadron, last dose was given 07/21/2010 with very good response. 2. 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 [name].  3. maintenance Revlimid at 10 mg by mouth daily status post 2 months. Therapy began 01/18/2011. 4. maintenance Revlimid at 15 mg by mouth daily with prophylactic dose Coumadin at 2 mg by mouth daily. 5. Systemic chemotherapy with Velcade at 1.3 mg per meter squared given on days 1, 4, 8 and 11 and Doxil at 30 mg per meter square given on day 4 and Decadron 40 mg by mouth on weekly basis given every 3 weeks. Status post 4 cycles. 6. Zometa 4 mg IV every 4 weeks.  7. Velcade 1.3 mg/M2 subcutaneous daily on a weekly basis with Decadron 20 mg by mouth on a weekly basis. First cycle expected on 06/21/2012. S/P 4 cycles.  CURRENT THERAPY:  1) Systemic chemotherapy with Carfilzomib, cyclophosphamide and Decadron. First cycle started on 08/09/2012. He is status post 17 cycles. 2) Zometa every 8 weeks. Next dose 05/27/2013.   INTERVAL HISTORY: Eddie Thomas 69 y.o. male returns to the clinic today for followup visit. He is currently on treatment with Carfilzomib, Cytoxan and dexamethasone status post 17 cycles and tolerating his treatment fairly well. The patient is feeling fine with no specific complaints except for continued left breast pain. He states his left nipple is been more sensitive over the last 3 months with pain in that area with pressure. He denied any similar symptoms on the right breast. He denied having any significant weight loss or night sweats. He has no fever or chills.  He has no nausea or vomiting. The patient denied having any significant chest pain, shortness of breath, cough or hemoptysis. He had repeat myeloma panel performed recently and he is here for evaluation and discussion of his lab results before starting cycle #18. He also has some general questions regarding the length of treatment for his multiple myeloma.  MEDICAL HISTORY: Past Medical History  Diagnosis Date  . Multiple myeloma   . Diabetes mellitus 07/03/2011  . Hypertension 07/03/2011  . Hyperlipidemia 07/03/2011  . Colon polyps 2012  . Gastric ulcer     ALLERGIES:  has No Known Allergies.  MEDICATIONS:  Current Outpatient Prescriptions  Medication Sig Dispense Refill  . Cholecalciferol (VITAMIN D-3) 5000 UNITS TABS Take 1 tablet by mouth daily.    [name] lidocaine-prilocaine (EMLA) cream APPLY CREAM TOPICALLY TO AFFECTED AREA AS DIRECTED 30 g 1  . lisinopril (PRINIVIL,ZESTRIL) 10 MG tablet Take 5 mg by mouth every evening.     [name] oxyCODONE-acetaminophen (PERCOCET/ROXICET) 5-325 MG per tablet Take 1 tablet by mouth every 6 (six) hours as needed for severe pain. 30 tablet 0  . pantoprazole (PROTONIX) 40 MG tablet Take 1 tablet (40 mg total) by mouth 2 (two) times daily. 60 tablet 11  . pioglitazone (ACTOS) 15 MG tablet Take 15 mg by mouth daily.    . prochlorperazine (COMPAZINE) 10 MG tablet Take 10 mg by mouth every 6 (six) hours as needed for nausea or vomiting.    . simvastatin (ZOCOR) 10 MG tablet Take 10 mg by  mouth at bedtime.      . temazepam (RESTORIL) 15 MG capsule Take 15 mg by mouth at bedtime as needed for sleep.    . valACYclovir (VALTREX) 500 MG tablet TAKE ONE TABLET BY MOUTH ONCE DAILY 30 tablet 3  . VIAGRA 50 MG tablet Take 50 mg by mouth daily as needed for erectile dysfunction.      No current facility-administered medications for this visit.   Facility-Administered Medications Ordered in Other Visits  Medication Dose Route Frequency Provider Last Rate Last Dose  . 0.9  %  sodium chloride infusion   Intravenous Once Curt Bears, MD      . sodium chloride 0.9 % injection 10 mL  10 mL Intracatheter PRN Curt Bears, MD   10 mL at 02/07/14 1247    SURGICAL HISTORY:  Past Surgical History  Procedure Laterality Date  . Limbal stem cell transplant    . Humerus fracture surgery      right  . 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    REVIEW OF SYSTEMS:  Constitutional: positive for fatigue Eyes: negative Ears, nose, mouth, throat, and face: negative Respiratory: negative Cardiovascular: negative Gastrointestinal: negative Genitourinary:negative Integument/breast: negative Hematologic/lymphatic: negative Musculoskeletal:positive for Bilateral hip pain Neurological: negative Behavioral/Psych: negative Endocrine: negative Allergic/Immunologic: negative   PHYSICAL EXAMINATION: General appearance: alert, cooperative and no distress Head: Normocephalic, without obvious abnormality, atraumatic Neck: no adenopathy, no JVD, supple, symmetrical, trachea midline and thyroid not enlarged, symmetric, no tenderness/mass/nodules Lymph nodes: Cervical, supraclavicular, and axillary nodes normal. Resp: clear to auscultation bilaterally Back: symmetric, no curvature. ROM normal. No CVA tenderness. Cardio: regular rate and rhythm, S1, S2 normal, no murmur, click, rub or gallop GI: soft, non-tender; bowel sounds normal; no masses,  no organomegaly Extremities: extremities normal, atraumatic, no cyanosis or edema Neurologic: Alert and oriented X 3, normal strength and tone. Normal symmetric reflexes. Normal coordination and gait Breast exam: Examination of the left breast reveals the left nipple to be slightly more prominent than the right there is an approximately 5-7 mm nodule at approximately 3:00 the areola that is tender to palpation. There are no skin changes. No other palpable masses within the left breast and there is nothing palpable in  the left axilla. Examination of the right breast is negative for any skin changes, palpable masses and there is nothing palpable within the right axilla  ECOG PERFORMANCE STATUS: 1 - Symptomatic but completely ambulatory  Blood pressure 163/75, pulse 66, temperature 97.5 F (36.4 C), temperature source Oral, resp. rate 18, height 5' 11"  (1.803 m), weight 177 lb 12.8 oz (80.65 kg).  LABORATORY DATA: Lab Results  Component Value Date   WBC 2.8* 02/07/2014   HGB 9.0* 02/07/2014   HCT 28.2* 02/07/2014   MCV 76.8* 02/07/2014   PLT 129* 02/07/2014      Chemistry      Component Value Date/Time   NA 140 02/07/2014 0913   NA 139 08/30/2013 0935   K 3.8 02/07/2014 0913   K 4.1 08/30/2013 0935   CL 101 08/30/2013 0935   CL 103 07/12/2012 0907   CO2 25 02/07/2014 0913   CO2 26 08/30/2013 0935   BUN 8.0 02/07/2014 0913   BUN 11 08/30/2013 0935   CREATININE 1.0 02/07/2014 0913   CREATININE 1.03 08/30/2013 0935      Component Value Date/Time   CALCIUM 9.0 02/07/2014 0913   CALCIUM 9.2 08/30/2013 0935   ALKPHOS 39* 02/07/2014 0913   ALKPHOS 39  08/30/2013 0935   AST 18 02/07/2014 0913   AST 13 08/30/2013 0935   ALT 11 02/07/2014 0913   ALT 9 08/30/2013 0935   BILITOT 1.08 02/07/2014 0913   BILITOT 1.3* 08/30/2013 0935     Other lab results:  Beta-2 microglobulin 3.13, free Light chain 0.03, free lambda light chain 10.60 with a kappa/lambda ratio 0.00. IgG 121, IgA 1300 and IgM less than 4. RADIOGRAPHIC STUDIES: No results found.  ASSESSMENT AND PLAN: This is a very pleasant 69 years old [place] American male with history of multiple myeloma currently undergoing systemic chemotherapy with Carfilzomib, cyclophosphamide and Decadron status post 16 cycles.  The patient is feeling fine and tolerating his treatment fairly well. The patient myeloma panel showed mild but no significant evidence for disease progression. I discussed the lab results and give a copy of the report to the  patient today. The patient was discussed with and also seen by Dr. Julien Nordmann. The left breast mass/nipple tenderness is a bit concerning. We will send him to the breast Center for an ultrasound-guided left mammogram for further evaluation. He'll proceed with cycle #18 of his systemic chemotherapy with Carfilzomib, Cytoxan and Decadron as a scheduled. He'll follow-up in one month with repeat CBC differential and C met prior to the start of cycle #19. He will will be due for his next Zometa infusion towards the end of February 2016. Regarding length of treatment for his multiple myeloma, 60 to Mr. Carl Best that his multiple myeloma is a "chronic disease" and will require treatment like any other chronic illnesses. He voiced understanding. He also understands the nature of the treatment may change over time depending on his response to treatment. He was advised to call immediately if he has any concerning symptoms in the interval.  The patient voices understanding of current disease status and treatment options and is in agreement with the current care plan. All questions were answered. The patient knows to call the clinic with any problems, questions or concerns. We can certainly see the patient much sooner if necessary.  Carlton Adam, PA-C 02/07/2014  ADDENDUM:  Hematology/Oncology Attending:  I had a face to face encounter with the patient today. I recommended his care plan. This is a very pleasant 69 years old African-American male with multiple myeloma currently undergoing systemic chemotherapy with Carfilzomib, Cytoxan and dexamethasone status post 17 cycles. He is tolerating his treatment fairly well except for mild fatigue and occasional aching pain in the bone. He has some question about the duration of treatment and if he would ever be cured of his multiple myeloma. I had a lengthy discussion with the patient about his condition and I explained to him that multiple myeloma could have complete  response at some point but unfortunately is not a curable condition and has tendency for relapse. I explained to the patient that he probably would require treatment off and on depending on his response.  For now I recommended for him to continue his current treatment with chemotherapy. He will proceed with cycle #18 today as a scheduled. The patient would come back for follow-up visit in 4 weeks with the next cycle of his treatment. He was advised to call immediately if he has any concerning symptoms in the interval.  Disclaimer: This note was dictated with voice recognition software. Similar sounding words can inadvertently be transcribed and may be missed upon review. Eilleen Kempf., MD 02/07/2014

## 2014-02-07 NOTE — Telephone Encounter (Signed)
Pt confirmed labs/ov per 01/19 POF, gave pt AVS..... KJ, sent msg to add chemo and schedule pt for MM per order

## 2014-02-07 NOTE — Patient Instructions (Signed)
You're being referred to the breast Center for further evaluation of your complaints of left nipple sensitivity and pain Continue labs and chemotherapy as scheduled Follow-up in one month

## 2014-02-08 ENCOUNTER — Ambulatory Visit (HOSPITAL_BASED_OUTPATIENT_CLINIC_OR_DEPARTMENT_OTHER): Payer: Medicare Other

## 2014-02-08 DIAGNOSIS — C9000 Multiple myeloma not having achieved remission: Secondary | ICD-10-CM

## 2014-02-08 DIAGNOSIS — Z5112 Encounter for antineoplastic immunotherapy: Secondary | ICD-10-CM

## 2014-02-08 MED ORDER — ONDANSETRON 8 MG/50ML IVPB (CHCC)
8.0000 mg | Freq: Once | INTRAVENOUS | Status: AC
  Administered 2014-02-08: 8 mg via INTRAVENOUS

## 2014-02-08 MED ORDER — HEPARIN SOD (PORK) LOCK FLUSH 100 UNIT/ML IV SOLN
500.0000 [IU] | Freq: Once | INTRAVENOUS | Status: AC | PRN
  Administered 2014-02-08: 500 [IU]
  Filled 2014-02-08: qty 5

## 2014-02-08 MED ORDER — DEXAMETHASONE SODIUM PHOSPHATE 10 MG/ML IJ SOLN
INTRAMUSCULAR | Status: AC
  Filled 2014-02-08: qty 1

## 2014-02-08 MED ORDER — DEXAMETHASONE SODIUM PHOSPHATE 10 MG/ML IJ SOLN
10.0000 mg | Freq: Once | INTRAMUSCULAR | Status: AC
  Administered 2014-02-08: 10 mg via INTRAVENOUS

## 2014-02-08 MED ORDER — ONDANSETRON 8 MG/NS 50 ML IVPB
INTRAVENOUS | Status: AC
  Filled 2014-02-08: qty 8

## 2014-02-08 MED ORDER — DEXTROSE 5 % IV SOLN
27.0000 mg/m2 | Freq: Once | INTRAVENOUS | Status: AC
  Administered 2014-02-08: 54 mg via INTRAVENOUS
  Filled 2014-02-08: qty 27

## 2014-02-08 MED ORDER — SODIUM CHLORIDE 0.9 % IJ SOLN
10.0000 mL | INTRAMUSCULAR | Status: DC | PRN
  Administered 2014-02-08: 10 mL
  Filled 2014-02-08: qty 10

## 2014-02-08 MED ORDER — SODIUM CHLORIDE 0.9 % IV SOLN
Freq: Once | INTRAVENOUS | Status: AC
Start: 2014-02-08 — End: 2014-02-08
  Administered 2014-02-08: 09:00:00 via INTRAVENOUS

## 2014-02-08 NOTE — Patient Instructions (Signed)
[facility] Discharge Instructions for Patients Receiving Chemotherapy  Today you received the following chemotherapy agents Kyprolis.  To help prevent nausea and vomiting after your treatment, we encourage you to take your nausea medication.   If you develop nausea and vomiting that is not controlled by your nausea medication, call the clinic.   BELOW ARE SYMPTOMS THAT SHOULD BE REPORTED IMMEDIATELY:  *FEVER GREATER THAN 100.5 F  *CHILLS WITH OR WITHOUT FEVER  NAUSEA AND VOMITING THAT IS NOT CONTROLLED WITH YOUR NAUSEA MEDICATION  *UNUSUAL SHORTNESS OF BREATH  *UNUSUAL BRUISING OR BLEEDING  TENDERNESS IN MOUTH AND THROAT WITH OR WITHOUT PRESENCE OF ULCERS  *URINARY PROBLEMS  *BOWEL PROBLEMS  UNUSUAL RASH Items with * indicate a potential emergency and should be followed up as soon as possible.  Feel free to call the clinic you have any questions or concerns. The clinic phone number is [phone].    

## 2014-02-13 ENCOUNTER — Other Ambulatory Visit: Payer: Self-pay | Admitting: Internal Medicine

## 2014-02-13 DIAGNOSIS — N6320 Unspecified lump in the left breast, unspecified quadrant: Secondary | ICD-10-CM

## 2014-02-14 ENCOUNTER — Ambulatory Visit (HOSPITAL_BASED_OUTPATIENT_CLINIC_OR_DEPARTMENT_OTHER): Payer: Medicare Other

## 2014-02-14 ENCOUNTER — Ambulatory Visit
Admission: RE | Admit: 2014-02-14 | Discharge: 2014-02-14 | Disposition: A | Discharge status: Home / Self Care | Payer: Medicare Other | Source: Ambulatory Visit | Attending: Physician Assistant | Admitting: Physician Assistant

## 2014-02-14 ENCOUNTER — Other Ambulatory Visit (HOSPITAL_BASED_OUTPATIENT_CLINIC_OR_DEPARTMENT_OTHER): Payer: Medicare Other

## 2014-02-14 DIAGNOSIS — N6320 Unspecified lump in the left breast, unspecified quadrant: Secondary | ICD-10-CM

## 2014-02-14 DIAGNOSIS — C9000 Multiple myeloma not having achieved remission: Secondary | ICD-10-CM

## 2014-02-14 DIAGNOSIS — Z5111 Encounter for antineoplastic chemotherapy: Secondary | ICD-10-CM

## 2014-02-14 DIAGNOSIS — Z5112 Encounter for antineoplastic immunotherapy: Secondary | ICD-10-CM

## 2014-02-14 DIAGNOSIS — C9002 Multiple myeloma in relapse: Secondary | ICD-10-CM

## 2014-02-14 LAB — COMPREHENSIVE METABOLIC PANEL (CC13)
ALK PHOS: 38 U/L — AB (ref 40–150)
ALT: 12 U/L (ref 0–55)
ANION GAP: 9 meq/L (ref 3–11)
AST: 14 U/L (ref 5–34)
Albumin: 3.5 g/dL (ref 3.5–5.0)
BUN: 11.9 mg/dL (ref 7.0–26.0)
CALCIUM: 9 mg/dL (ref 8.4–10.4)
CO2: 26 meq/L (ref 22–29)
Chloride: 103 mEq/L (ref 98–109)
Creatinine: 1 mg/dL (ref 0.7–1.3)
EGFR: 88 mL/min/{1.73_m2} — ABNORMAL LOW (ref >90)
Glucose: 192 mg/dl — ABNORMAL HIGH (ref 70–140)
Potassium: 4.6 mEq/L (ref 3.5–5.1)
Sodium: 138 mEq/L (ref 136–145)
TOTAL PROTEIN: 6.6 g/dL (ref 6.4–8.3)
Total Bilirubin: 1.27 mg/dL — ABNORMAL HIGH (ref 0.20–1.20)

## 2014-02-14 LAB — CBC WITH DIFFERENTIAL/PLATELET
BASO%: 0 % (ref 0.0–2.0)
Basophils Absolute: 0 10*3/uL (ref 0.0–0.1)
EOS ABS: 0 10*3/uL (ref 0.0–0.5)
EOS%: 0.6 % (ref 0.0–7.0)
HEMATOCRIT: 28.6 % — AB (ref 38.4–49.9)
HEMOGLOBIN: 9 g/dL — AB (ref 13.0–17.1)
LYMPH%: 12.5 % — ABNORMAL LOW (ref 14.0–49.0)
MCH: 24.4 pg — ABNORMAL LOW (ref 27.2–33.4)
MCHC: 31.5 g/dL — ABNORMAL LOW (ref 32.0–36.0)
MCV: 77.5 fL — AB (ref 79.3–98.0)
MONO#: 0.3 10*3/uL (ref 0.1–0.9)
MONO%: 9.8 % (ref 0.0–14.0)
NEUT#: 2.5 10*3/uL (ref 1.5–6.5)
NEUT%: 77.1 % — ABNORMAL HIGH (ref 39.0–75.0)
PLATELETS: 102 10*3/uL — AB (ref 140–400)
RBC: 3.69 10*6/uL — AB (ref 4.20–5.82)
RDW: 15.7 % — ABNORMAL HIGH (ref 11.0–14.6)
WBC: 3.3 10*3/uL — ABNORMAL LOW (ref 4.0–10.3)
lymph#: 0.4 10*3/uL — ABNORMAL LOW (ref 0.9–3.3)

## 2014-02-14 MED ORDER — ONDANSETRON 8 MG/50ML IVPB (CHCC)
8.0000 mg | Freq: Once | INTRAVENOUS | Status: AC
  Administered 2014-02-14: 8 mg via INTRAVENOUS

## 2014-02-14 MED ORDER — DEXAMETHASONE SODIUM PHOSPHATE 10 MG/ML IJ SOLN
10.0000 mg | Freq: Once | INTRAMUSCULAR | Status: AC
  Administered 2014-02-14: 10 mg via INTRAVENOUS

## 2014-02-14 MED ORDER — SODIUM CHLORIDE 0.9 % IV SOLN
Freq: Once | INTRAVENOUS | Status: AC
  Administered 2014-02-14: 13:00:00 via INTRAVENOUS

## 2014-02-14 MED ORDER — SODIUM CHLORIDE 0.9 % IV SOLN
300.0000 mg/m2 | Freq: Once | INTRAVENOUS | Status: AC
  Administered 2014-02-14: 600 mg via INTRAVENOUS
  Filled 2014-02-14: qty 30

## 2014-02-14 MED ORDER — DEXTROSE 5 % IV SOLN
27.0000 mg/m2 | Freq: Once | INTRAVENOUS | Status: AC
  Administered 2014-02-14: 54 mg via INTRAVENOUS
  Filled 2014-02-14: qty 27

## 2014-02-14 MED ORDER — ONDANSETRON 8 MG/NS 50 ML IVPB
INTRAVENOUS | Status: AC
  Filled 2014-02-14: qty 8

## 2014-02-14 MED ORDER — HEPARIN SOD (PORK) LOCK FLUSH 100 UNIT/ML IV SOLN
500.0000 [IU] | Freq: Once | INTRAVENOUS | Status: AC | PRN
  Administered 2014-02-14: 500 [IU]
  Filled 2014-02-14: qty 5

## 2014-02-14 MED ORDER — SODIUM CHLORIDE 0.9 % IV SOLN
Freq: Once | INTRAVENOUS | Status: AC
  Administered 2014-02-14: 250 mL via INTRAVENOUS

## 2014-02-14 MED ORDER — DEXAMETHASONE SODIUM PHOSPHATE 10 MG/ML IJ SOLN
INTRAMUSCULAR | Status: AC
  Filled 2014-02-14: qty 1

## 2014-02-14 MED ORDER — SODIUM CHLORIDE 0.9 % IJ SOLN
10.0000 mL | INTRAMUSCULAR | Status: DC | PRN
  Administered 2014-02-14: 10 mL
  Filled 2014-02-14: qty 10

## 2014-02-14 NOTE — Patient Instructions (Signed)
[facility] Discharge Instructions for Patients Receiving Chemotherapy  Today you received the following chemotherapy agents Kyprolis and Cytoxan.  To help prevent nausea and vomiting after your treatment, we encourage you to take your nausea medication.   If you develop nausea and vomiting that is not controlled by your nausea medication, call the clinic.   BELOW ARE SYMPTOMS THAT SHOULD BE REPORTED IMMEDIATELY:  *FEVER GREATER THAN 100.5 F  *CHILLS WITH OR WITHOUT FEVER  NAUSEA AND VOMITING THAT IS NOT CONTROLLED WITH YOUR NAUSEA MEDICATION  *UNUSUAL SHORTNESS OF BREATH  *UNUSUAL BRUISING OR BLEEDING  TENDERNESS IN MOUTH AND THROAT WITH OR WITHOUT PRESENCE OF ULCERS  *URINARY PROBLEMS  *BOWEL PROBLEMS  UNUSUAL RASH Items with * indicate a potential emergency and should be followed up as soon as possible.  Feel free to call the clinic you have any questions or concerns. The clinic phone number is [phone].    

## 2014-02-15 ENCOUNTER — Ambulatory Visit (HOSPITAL_BASED_OUTPATIENT_CLINIC_OR_DEPARTMENT_OTHER): Payer: Medicare Other

## 2014-02-15 DIAGNOSIS — Z5112 Encounter for antineoplastic immunotherapy: Secondary | ICD-10-CM

## 2014-02-15 DIAGNOSIS — C9000 Multiple myeloma not having achieved remission: Secondary | ICD-10-CM

## 2014-02-15 MED ORDER — ONDANSETRON 8 MG/50ML IVPB (CHCC)
8.0000 mg | Freq: Once | INTRAVENOUS | Status: AC
  Administered 2014-02-15: 8 mg via INTRAVENOUS

## 2014-02-15 MED ORDER — CARFILZOMIB CHEMO INJECTION 60 MG
27.0000 mg/m2 | Freq: Once | INTRAVENOUS | Status: AC
  Administered 2014-02-15: 54 mg via INTRAVENOUS
  Filled 2014-02-15: qty 27

## 2014-02-15 MED ORDER — DEXAMETHASONE SODIUM PHOSPHATE 10 MG/ML IJ SOLN
10.0000 mg | Freq: Once | INTRAMUSCULAR | Status: AC
  Administered 2014-02-15: 10 mg via INTRAVENOUS

## 2014-02-15 MED ORDER — HEPARIN SOD (PORK) LOCK FLUSH 100 UNIT/ML IV SOLN
500.0000 [IU] | Freq: Once | INTRAVENOUS | Status: AC | PRN
  Administered 2014-02-15: 500 [IU]
  Filled 2014-02-15: qty 5

## 2014-02-15 MED ORDER — ONDANSETRON 8 MG/NS 50 ML IVPB
INTRAVENOUS | Status: AC
  Filled 2014-02-15: qty 8

## 2014-02-15 MED ORDER — SODIUM CHLORIDE 0.9 % IV SOLN: Freq: Once | INTRAVENOUS | Status: DC

## 2014-02-15 MED ORDER — DEXAMETHASONE SODIUM PHOSPHATE 10 MG/ML IJ SOLN
INTRAMUSCULAR | Status: AC
  Filled 2014-02-15: qty 1

## 2014-02-15 MED ORDER — SODIUM CHLORIDE 0.9 % IJ SOLN
10.0000 mL | INTRAMUSCULAR | Status: DC | PRN
  Administered 2014-02-15: 10 mL
  Filled 2014-02-15: qty 10

## 2014-02-15 MED ORDER — SODIUM CHLORIDE 0.9 % IV SOLN
Freq: Once | INTRAVENOUS | Status: AC
  Administered 2014-02-15: 14:00:00 via INTRAVENOUS

## 2014-02-15 NOTE — Patient Instructions (Signed)
[facility] Discharge Instructions for Patients Receiving Chemotherapy  Today you received the following chemotherapy agents Kyprolis.  To help prevent nausea and vomiting after your treatment, we encourage you to take your nausea medication.   If you develop nausea and vomiting that is not controlled by your nausea medication, call the clinic.   BELOW ARE SYMPTOMS THAT SHOULD BE REPORTED IMMEDIATELY:  *FEVER GREATER THAN 100.5 F  *CHILLS WITH OR WITHOUT FEVER  NAUSEA AND VOMITING THAT IS NOT CONTROLLED WITH YOUR NAUSEA MEDICATION  *UNUSUAL SHORTNESS OF BREATH  *UNUSUAL BRUISING OR BLEEDING  TENDERNESS IN MOUTH AND THROAT WITH OR WITHOUT PRESENCE OF ULCERS  *URINARY PROBLEMS  *BOWEL PROBLEMS  UNUSUAL RASH Items with * indicate a potential emergency and should be followed up as soon as possible.  Feel free to call the clinic you have any questions or concerns. The clinic phone number is [phone].    

## 2014-02-21 ENCOUNTER — Other Ambulatory Visit (HOSPITAL_BASED_OUTPATIENT_CLINIC_OR_DEPARTMENT_OTHER): Payer: Medicare Other

## 2014-02-21 ENCOUNTER — Ambulatory Visit (HOSPITAL_BASED_OUTPATIENT_CLINIC_OR_DEPARTMENT_OTHER): Payer: Medicare Other

## 2014-02-21 DIAGNOSIS — C9000 Multiple myeloma not having achieved remission: Secondary | ICD-10-CM

## 2014-02-21 DIAGNOSIS — Z5112 Encounter for antineoplastic immunotherapy: Secondary | ICD-10-CM

## 2014-02-21 DIAGNOSIS — Z5111 Encounter for antineoplastic chemotherapy: Secondary | ICD-10-CM

## 2014-02-21 DIAGNOSIS — C9002 Multiple myeloma in relapse: Secondary | ICD-10-CM

## 2014-02-21 LAB — COMPREHENSIVE METABOLIC PANEL (CC13)
ALBUMIN: 3.5 g/dL (ref 3.5–5.0)
ALT: 11 U/L (ref 0–55)
ANION GAP: 10 meq/L (ref 3–11)
AST: 18 U/L (ref 5–34)
Alkaline Phosphatase: 38 U/L — ABNORMAL LOW (ref 40–150)
BUN: 11.1 mg/dL (ref 7.0–26.0)
CO2: 23 meq/L (ref 22–29)
CREATININE: 0.9 mg/dL (ref 0.7–1.3)
Calcium: 8.4 mg/dL (ref 8.4–10.4)
Chloride: 104 mEq/L (ref 98–109)
EGFR: >90 mL/min/{1.73_m2} (ref >90)
GLUCOSE: 268 mg/dL — AB (ref 70–140)
Potassium: 4.4 mEq/L (ref 3.5–5.1)
SODIUM: 137 meq/L (ref 136–145)
Total Bilirubin: 1.14 mg/dL (ref 0.20–1.20)
Total Protein: 6.8 g/dL (ref 6.4–8.3)

## 2014-02-21 LAB — CBC WITH DIFFERENTIAL/PLATELET
BASO%: 0.8 % (ref 0.0–2.0)
Basophils Absolute: 0 10*3/uL (ref 0.0–0.1)
EOS%: 0.5 % (ref 0.0–7.0)
Eosinophils Absolute: 0 10*3/uL (ref 0.0–0.5)
HEMATOCRIT: 29.3 % — AB (ref 38.4–49.9)
HGB: 8.9 g/dL — ABNORMAL LOW (ref 13.0–17.1)
LYMPH%: 7.9 % — ABNORMAL LOW (ref 14.0–49.0)
MCH: 23.8 pg — AB (ref 27.2–33.4)
MCHC: 30.2 g/dL — ABNORMAL LOW (ref 32.0–36.0)
MCV: 78.7 fL — AB (ref 79.3–98.0)
MONO#: 0.3 10*3/uL (ref 0.1–0.9)
MONO%: 9.9 % (ref 0.0–14.0)
NEUT%: 80.9 % — ABNORMAL HIGH (ref 39.0–75.0)
NEUTROS ABS: 2.6 10*3/uL (ref 1.5–6.5)
PLATELETS: 133 10*3/uL — AB (ref 140–400)
RBC: 3.73 10*6/uL — ABNORMAL LOW (ref 4.20–5.82)
RDW: 15.5 % — AB (ref 11.0–14.6)
WBC: 3.2 10*3/uL — ABNORMAL LOW (ref 4.0–10.3)
lymph#: 0.2 10*3/uL — ABNORMAL LOW (ref 0.9–3.3)

## 2014-02-21 MED ORDER — HEPARIN SOD (PORK) LOCK FLUSH 100 UNIT/ML IV SOLN
500.0000 [IU] | Freq: Once | INTRAVENOUS | Status: AC | PRN
  Administered 2014-02-21: 500 [IU]
  Filled 2014-02-21: qty 5

## 2014-02-21 MED ORDER — DEXTROSE 5 % IV SOLN
27.0000 mg/m2 | Freq: Once | INTRAVENOUS | Status: AC
  Administered 2014-02-21: 54 mg via INTRAVENOUS
  Filled 2014-02-21: qty 27

## 2014-02-21 MED ORDER — DEXAMETHASONE SODIUM PHOSPHATE 10 MG/ML IJ SOLN
INTRAMUSCULAR | Status: AC
  Filled 2014-02-21: qty 1

## 2014-02-21 MED ORDER — SODIUM CHLORIDE 0.9 % IV SOLN
300.0000 mg/m2 | Freq: Once | INTRAVENOUS | Status: AC
  Administered 2014-02-21: 600 mg via INTRAVENOUS
  Filled 2014-02-21: qty 30

## 2014-02-21 MED ORDER — ONDANSETRON 8 MG/NS 50 ML IVPB
INTRAVENOUS | Status: AC
  Filled 2014-02-21: qty 8

## 2014-02-21 MED ORDER — ONDANSETRON 8 MG/50ML IVPB (CHCC)
8.0000 mg | Freq: Once | INTRAVENOUS | Status: AC
  Administered 2014-02-21: 8 mg via INTRAVENOUS

## 2014-02-21 MED ORDER — SODIUM CHLORIDE 0.9 % IJ SOLN
10.0000 mL | INTRAMUSCULAR | Status: DC | PRN
  Administered 2014-02-21: 10 mL
  Filled 2014-02-21: qty 10

## 2014-02-21 MED ORDER — DEXAMETHASONE SODIUM PHOSPHATE 10 MG/ML IJ SOLN
10.0000 mg | Freq: Once | INTRAMUSCULAR | Status: AC
  Administered 2014-02-21: 10 mg via INTRAVENOUS

## 2014-02-21 MED ORDER — SODIUM CHLORIDE 0.9 % IV SOLN
Freq: Once | INTRAVENOUS | Status: AC
  Administered 2014-02-21: 13:00:00 via INTRAVENOUS

## 2014-02-21 NOTE — Progress Notes (Signed)
Discharged at 1535 alone, ambulatory in no distress

## 2014-02-21 NOTE — Progress Notes (Signed)
Patient asked for copy of labs before discharged.  Surprised at glucose level = 288.  Not fasting results and reports he'd eaten.  Discussed carbohydrates with him and added carbohydrate counting information to discharge AVS summary.

## 2014-02-21 NOTE — Patient Instructions (Addendum)
[facility] Discharge Instructions for Patients Receiving Chemotherapy  Today you received the following chemotherapy agents cytoxan/kyprolis  To help prevent nausea and vomiting after your treatment, we encourage you to take your nausea medication as directed   If you develop nausea and vomiting that is not controlled by your nausea medication, call the clinic.   BELOW ARE SYMPTOMS THAT SHOULD BE REPORTED IMMEDIATELY:  *FEVER GREATER THAN 100.5 F  *CHILLS WITH OR WITHOUT FEVER  NAUSEA AND VOMITING THAT IS NOT CONTROLLED WITH YOUR NAUSEA MEDICATION  *UNUSUAL SHORTNESS OF BREATH  *UNUSUAL BRUISING OR BLEEDING  TENDERNESS IN MOUTH AND THROAT WITH OR WITHOUT PRESENCE OF ULCERS  *URINARY PROBLEMS  *BOWEL PROBLEMS  UNUSUAL RASH Items with * indicate a potential emergency and should be followed up as soon as possible.  Feel free to call the clinic you have any questions or concerns. The clinic phone number is [phone]. Basic Carbohydrate Counting for Diabetes Mellitus Carbohydrate counting is a method for keeping track of the amount of carbohydrates you eat. Eating carbohydrates naturally increases the level of sugar (glucose) in your blood, so it is important for you to know the amount that is okay for you to have in every meal. Carbohydrate counting helps keep the level of glucose in your blood within normal limits. The amount of carbohydrates allowed is different for every person. A dietitian can help you calculate the amount that is right for you. Once you know the amount of carbohydrates you can have, you can count the carbohydrates in the foods you want to eat. Carbohydrates are found in the following foods:  Grains, such as breads and cereals.  Dried beans and soy products.  Starchy vegetables, such as potatoes, peas, and corn.  Fruit and fruit juices.  Milk and yogurt.  Sweets and snack foods, such as cake, cookies, candy, chips, soft drinks, and  fruit drinks. CARBOHYDRATE COUNTING There are two ways to count the carbohydrates in your food. You can use either of the methods or a combination of both. Reading the "Nutrition Facts" on [place] The "Nutrition Facts" is an area that is included on the labels of almost all packaged food and beverages in the [place]. It includes the serving size of that food or beverage and information about the nutrients in each serving of the food, including the grams (g) of carbohydrate per serving.  Decide the number of servings of this food or beverage that you will be able to eat or drink. Multiply that number of servings by the number of grams of carbohydrate that is listed on the label for that serving. The total will be the amount of carbohydrates you will be having when you eat or drink this food or beverage. Learning Standard Serving Sizes of Food When you eat food that is not packaged or does not include "Nutrition Facts" on the label, you need to measure the servings in order to count the amount of carbohydrates.A serving of most carbohydrate-rich foods contains about 15 g of carbohydrates. The following list includes serving sizes of carbohydrate-rich foods that provide 15 g ofcarbohydrate per serving:   1 slice of bread (1 oz) or 1 six-inch tortilla.    of a hamburger bun or English muffin.  4-6 crackers.   cup unsweetened dry cereal.    cup hot cereal.   cup rice or pasta.    cup mashed potatoes or  of a large baked potato.  1 cup fresh fruit or one small  piece of fruit.    cup canned or frozen fruit or fruit juice.  1 cup milk.   cup plain fat-free yogurt or yogurt sweetened with artificial sweeteners.   cup cooked dried beans or starchy vegetable, such as peas, corn, or potatoes.  Decide the number of standard-size servings that you will eat. Multiply that number of servings by 15 (the grams of carbohydrates in that serving). For example, if you eat 2 cups  of strawberries, you will have eaten 2 servings and 30 g of carbohydrates (2 servings x 15 g = 30 g). For foods such as soups and casseroles, in which more than one food is mixed in, you will need to count the carbohydrates in each food that is included. EXAMPLE OF CARBOHYDRATE COUNTING Sample Dinner  3 oz chicken breast.   cup of brown rice.   cup of corn.  1 cup milk.   1 cup strawberries with sugar-free whipped topping.  Carbohydrate Calculation Step 1: Identify the foods that contain carbohydrates:   Rice.   Corn.   Milk.   Strawberries. Step 2:Calculate the number of servings eaten of each:   2 servings of rice.   1 serving of corn.   1 serving of milk.   1 serving of strawberries. Step 3: Multiply each of those number of servings by 15 g:   2 servings of rice x 15 g = 30 g.   1 serving of corn x 15 g = 15 g.   1 serving of milk x 15 g = 15 g.   1 serving of strawberries x 15 g = 15 g. Step 4: Add together all of the amounts to find the total grams of carbohydrates eaten: 30 g + 15 g + 15 g + 15 g = 75 g. Document Released: 01/06/2005 Document Revised: 05/23/2013 Document Reviewed: 12/03/2012 [facility] Patient Information 2015 [place], [place]. This information is not intended to replace advice given to you by your health care provider. Make sure you discuss any questions you have with your health care provider.

## 2014-02-22 ENCOUNTER — Ambulatory Visit (HOSPITAL_BASED_OUTPATIENT_CLINIC_OR_DEPARTMENT_OTHER): Payer: Medicare Other

## 2014-02-22 DIAGNOSIS — C9000 Multiple myeloma not having achieved remission: Secondary | ICD-10-CM

## 2014-02-22 DIAGNOSIS — Z5112 Encounter for antineoplastic immunotherapy: Secondary | ICD-10-CM

## 2014-02-22 MED ORDER — DEXTROSE 5 % IV SOLN
27.0000 mg/m2 | Freq: Once | INTRAVENOUS | Status: AC
  Administered 2014-02-22: 54 mg via INTRAVENOUS
  Filled 2014-02-22: qty 27

## 2014-02-22 MED ORDER — SODIUM CHLORIDE 0.9 % IV SOLN
Freq: Once | INTRAVENOUS | Status: AC
  Administered 2014-02-22: 13:00:00 via INTRAVENOUS

## 2014-02-22 MED ORDER — HEPARIN SOD (PORK) LOCK FLUSH 100 UNIT/ML IV SOLN
500.0000 [IU] | Freq: Once | INTRAVENOUS | Status: AC | PRN
  Administered 2014-02-22: 500 [IU]
  Filled 2014-02-22: qty 5

## 2014-02-22 MED ORDER — SODIUM CHLORIDE 0.9 % IJ SOLN
10.0000 mL | INTRAMUSCULAR | Status: DC | PRN
  Administered 2014-02-22: 10 mL
  Filled 2014-02-22: qty 10

## 2014-02-22 MED ORDER — ONDANSETRON 8 MG/NS 50 ML IVPB
INTRAVENOUS | Status: AC
  Filled 2014-02-22: qty 8

## 2014-02-22 MED ORDER — ONDANSETRON 8 MG/50ML IVPB (CHCC)
8.0000 mg | Freq: Once | INTRAVENOUS | Status: AC
  Administered 2014-02-22: 8 mg via INTRAVENOUS

## 2014-02-22 MED ORDER — DEXAMETHASONE SODIUM PHOSPHATE 10 MG/ML IJ SOLN
INTRAMUSCULAR | Status: AC
  Filled 2014-02-22: qty 1

## 2014-02-22 MED ORDER — DEXAMETHASONE SODIUM PHOSPHATE 10 MG/ML IJ SOLN
10.0000 mg | Freq: Once | INTRAMUSCULAR | Status: AC
  Administered 2014-02-22: 10 mg via INTRAVENOUS

## 2014-02-22 NOTE — Patient Instructions (Signed)
[facility] Discharge Instructions for Patients Receiving Chemotherapy  Today you received the following chemotherapy agents Kyprolis To help prevent nausea and vomiting after your treatment, we encourage you to take your nausea medication as prescribed.  If you develop nausea and vomiting that is not controlled by your nausea medication, call the clinic.   BELOW ARE SYMPTOMS THAT SHOULD BE REPORTED IMMEDIATELY:  *FEVER GREATER THAN 100.5 F  *CHILLS WITH OR WITHOUT FEVER  NAUSEA AND VOMITING THAT IS NOT CONTROLLED WITH YOUR NAUSEA MEDICATION  *UNUSUAL SHORTNESS OF BREATH  *UNUSUAL BRUISING OR BLEEDING  TENDERNESS IN MOUTH AND THROAT WITH OR WITHOUT PRESENCE OF ULCERS  *URINARY PROBLEMS  *BOWEL PROBLEMS  UNUSUAL RASH Items with * indicate a potential emergency and should be followed up as soon as possible.  Feel free to call the clinic you have any questions or concerns. The clinic phone number is [phone].    

## 2014-02-28 ENCOUNTER — Other Ambulatory Visit (HOSPITAL_BASED_OUTPATIENT_CLINIC_OR_DEPARTMENT_OTHER): Payer: Medicare Other

## 2014-02-28 DIAGNOSIS — C9000 Multiple myeloma not having achieved remission: Secondary | ICD-10-CM

## 2014-02-28 DIAGNOSIS — C9002 Multiple myeloma in relapse: Secondary | ICD-10-CM

## 2014-02-28 LAB — CBC WITH DIFFERENTIAL/PLATELET
BASO%: 0 % (ref 0.0–2.0)
Basophils Absolute: 0 10*3/uL (ref 0.0–0.1)
EOS ABS: 0 10*3/uL (ref 0.0–0.5)
EOS%: 0.8 % (ref 0.0–7.0)
HCT: 27.7 % — ABNORMAL LOW (ref 38.4–49.9)
HEMOGLOBIN: 8.8 g/dL — AB (ref 13.0–17.1)
LYMPH%: 11.6 % — ABNORMAL LOW (ref 14.0–49.0)
MCH: 24.4 pg — ABNORMAL LOW (ref 27.2–33.4)
MCHC: 31.8 g/dL — ABNORMAL LOW (ref 32.0–36.0)
MCV: 76.7 fL — ABNORMAL LOW (ref 79.3–98.0)
MONO#: 0.2 10*3/uL (ref 0.1–0.9)
MONO%: 9.2 % (ref 0.0–14.0)
NEUT%: 78.4 % — ABNORMAL HIGH (ref 39.0–75.0)
NEUTROS ABS: 2 10*3/uL (ref 1.5–6.5)
NRBC: 0 % (ref 0–0)
Platelets: 91 10*3/uL — ABNORMAL LOW (ref 140–400)
RBC: 3.61 10*6/uL — AB (ref 4.20–5.82)
RDW: 15.5 % — AB (ref 11.0–14.6)
WBC: 2.5 10*3/uL — ABNORMAL LOW (ref 4.0–10.3)
lymph#: 0.3 10*3/uL — ABNORMAL LOW (ref 0.9–3.3)

## 2014-02-28 LAB — COMPREHENSIVE METABOLIC PANEL (CC13)
ALK PHOS: 36 U/L — AB (ref 40–150)
ALT: 11 U/L (ref 0–55)
AST: 15 U/L (ref 5–34)
Albumin: 3.4 g/dL — ABNORMAL LOW (ref 3.5–5.0)
Anion Gap: 8 mEq/L (ref 3–11)
BUN: 9.2 mg/dL (ref 7.0–26.0)
CALCIUM: 8.8 mg/dL (ref 8.4–10.4)
CO2: 25 mEq/L (ref 22–29)
CREATININE: 0.9 mg/dL (ref 0.7–1.3)
Chloride: 105 mEq/L (ref 98–109)
EGFR: >90 mL/min/{1.73_m2} (ref >90)
Glucose: 111 mg/dl (ref 70–140)
Potassium: 4.2 mEq/L (ref 3.5–5.1)
Sodium: 139 mEq/L (ref 136–145)
Total Bilirubin: 1.12 mg/dL (ref 0.20–1.20)
Total Protein: 6.6 g/dL (ref 6.4–8.3)

## 2014-02-28 LAB — TECHNOLOGIST REVIEW

## 2014-03-02 ENCOUNTER — Telehealth: Payer: Self-pay | Admitting: Internal Medicine

## 2014-03-02 NOTE — Telephone Encounter (Signed)
returned call and s.w. pt and confirmed appt °

## 2014-03-07 ENCOUNTER — Ambulatory Visit (HOSPITAL_BASED_OUTPATIENT_CLINIC_OR_DEPARTMENT_OTHER): Payer: Medicare Other | Admitting: Internal Medicine

## 2014-03-07 ENCOUNTER — Other Ambulatory Visit (HOSPITAL_BASED_OUTPATIENT_CLINIC_OR_DEPARTMENT_OTHER): Payer: Medicare Other

## 2014-03-07 ENCOUNTER — Telehealth: Payer: Self-pay | Admitting: *Deleted

## 2014-03-07 ENCOUNTER — Ambulatory Visit (HOSPITAL_BASED_OUTPATIENT_CLINIC_OR_DEPARTMENT_OTHER): Payer: Medicare Other

## 2014-03-07 ENCOUNTER — Encounter: Payer: Self-pay | Admitting: Internal Medicine

## 2014-03-07 ENCOUNTER — Telehealth: Payer: Self-pay | Admitting: Internal Medicine

## 2014-03-07 VITALS — BP 123/67 | HR 69 | Temp 97.8°F | Resp 18 | Ht 71.0 in | Wt 174.4 lb

## 2014-03-07 DIAGNOSIS — C9000 Multiple myeloma not having achieved remission: Secondary | ICD-10-CM

## 2014-03-07 DIAGNOSIS — C9002 Multiple myeloma in relapse: Secondary | ICD-10-CM

## 2014-03-07 DIAGNOSIS — Z5112 Encounter for antineoplastic immunotherapy: Secondary | ICD-10-CM

## 2014-03-07 DIAGNOSIS — Z5111 Encounter for antineoplastic chemotherapy: Secondary | ICD-10-CM

## 2014-03-07 LAB — COMPREHENSIVE METABOLIC PANEL (CC13)
ALT: 15 U/L (ref 0–55)
ANION GAP: 11 meq/L (ref 3–11)
AST: 20 U/L (ref 5–34)
Albumin: 3.6 g/dL (ref 3.5–5.0)
Alkaline Phosphatase: 37 U/L — ABNORMAL LOW (ref 40–150)
BILIRUBIN TOTAL: 0.96 mg/dL (ref 0.20–1.20)
BUN: 12.2 mg/dL (ref 7.0–26.0)
CO2: 24 meq/L (ref 22–29)
Calcium: 9.1 mg/dL (ref 8.4–10.4)
Chloride: 105 mEq/L (ref 98–109)
Creatinine: 1 mg/dL (ref 0.7–1.3)
EGFR: 87 mL/min/{1.73_m2} — AB (ref >90)
GLUCOSE: 140 mg/dL (ref 70–140)
Potassium: 4.1 mEq/L (ref 3.5–5.1)
Sodium: 139 mEq/L (ref 136–145)
TOTAL PROTEIN: 7.2 g/dL (ref 6.4–8.3)

## 2014-03-07 LAB — CBC WITH DIFFERENTIAL/PLATELET
BASO%: 0.4 % (ref 0.0–2.0)
BASOS ABS: 0 10*3/uL (ref 0.0–0.1)
EOS ABS: 0 10*3/uL (ref 0.0–0.5)
EOS%: 0.4 % (ref 0.0–7.0)
HCT: 29.6 % — ABNORMAL LOW (ref 38.4–49.9)
HGB: 8.9 g/dL — ABNORMAL LOW (ref 13.0–17.1)
LYMPH#: 0.3 10*3/uL — AB (ref 0.9–3.3)
LYMPH%: 10.7 % — ABNORMAL LOW (ref 14.0–49.0)
MCH: 24 pg — AB (ref 27.2–33.4)
MCHC: 30.3 g/dL — ABNORMAL LOW (ref 32.0–36.0)
MCV: 79.1 fL — ABNORMAL LOW (ref 79.3–98.0)
MONO#: 0.4 10*3/uL (ref 0.1–0.9)
MONO%: 12.8 % (ref 0.0–14.0)
NEUT#: 2.2 10*3/uL (ref 1.5–6.5)
NEUT%: 75.7 % — ABNORMAL HIGH (ref 39.0–75.0)
Platelets: 152 10*3/uL (ref 140–400)
RBC: 3.73 10*6/uL — ABNORMAL LOW (ref 4.20–5.82)
RDW: 16.3 % — AB (ref 11.0–14.6)
WBC: 3 10*3/uL — ABNORMAL LOW (ref 4.0–10.3)

## 2014-03-07 MED ORDER — HEPARIN SOD (PORK) LOCK FLUSH 100 UNIT/ML IV SOLN
500.0000 [IU] | Freq: Once | INTRAVENOUS | Status: AC | PRN
  Administered 2014-03-07: 500 [IU]
  Filled 2014-03-07: qty 5

## 2014-03-07 MED ORDER — SODIUM CHLORIDE 0.9 % IV SOLN
300.0000 mg/m2 | Freq: Once | INTRAVENOUS | Status: AC
  Administered 2014-03-07: 600 mg via INTRAVENOUS
  Filled 2014-03-07: qty 30

## 2014-03-07 MED ORDER — SODIUM CHLORIDE 0.9 % IJ SOLN
10.0000 mL | INTRAMUSCULAR | Status: DC | PRN
  Administered 2014-03-07: 10 mL
  Filled 2014-03-07: qty 10

## 2014-03-07 MED ORDER — ONDANSETRON 8 MG/NS 50 ML IVPB
INTRAVENOUS | Status: AC
  Filled 2014-03-07: qty 8

## 2014-03-07 MED ORDER — ONDANSETRON 8 MG/50ML IVPB (CHCC)
8.0000 mg | Freq: Once | INTRAVENOUS | Status: AC
  Administered 2014-03-07: 8 mg via INTRAVENOUS

## 2014-03-07 MED ORDER — DEXAMETHASONE SODIUM PHOSPHATE 10 MG/ML IJ SOLN
INTRAMUSCULAR | Status: AC
  Filled 2014-03-07: qty 1

## 2014-03-07 MED ORDER — ZOLEDRONIC ACID 4 MG/100ML IV SOLN
4.0000 mg | Freq: Once | INTRAVENOUS | Status: AC
  Administered 2014-03-07: 4 mg via INTRAVENOUS
  Filled 2014-03-07: qty 100

## 2014-03-07 MED ORDER — SODIUM CHLORIDE 0.9 % IV SOLN
Freq: Once | INTRAVENOUS | Status: AC
  Administered 2014-03-07: 11:00:00 via INTRAVENOUS

## 2014-03-07 MED ORDER — DEXAMETHASONE SODIUM PHOSPHATE 10 MG/ML IJ SOLN
10.0000 mg | Freq: Once | INTRAMUSCULAR | Status: AC
  Administered 2014-03-07: 10 mg via INTRAVENOUS

## 2014-03-07 MED ORDER — DEXTROSE 5 % IV SOLN
27.0000 mg/m2 | Freq: Once | INTRAVENOUS | Status: AC
  Administered 2014-03-07: 54 mg via INTRAVENOUS
  Filled 2014-03-07: qty 27

## 2014-03-07 NOTE — Telephone Encounter (Signed)
Per staff message and POF I have scheduled appts. Advised scheduler of appts. JMW  

## 2014-03-07 NOTE — Telephone Encounter (Signed)
Gave avs & calendar for March. Sent message to schedule treatment. °

## 2014-03-07 NOTE — Progress Notes (Signed)
[place] [phone]   [phone]  OFFICE PROGRESS NOTE  [name] 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   PRIOR THERAPY: :  1. Status post 6 cycles of systemic chemotherapy with Revlimid and Decadron, last dose was given 07/21/2010 with very good response. 2. 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 [name].  3. maintenance Revlimid at 10 mg by mouth daily status post 2 months. Therapy began 01/18/2011. 4. maintenance Revlimid at 15 mg by mouth daily with prophylactic dose Coumadin at 2 mg by mouth daily. 5. Systemic chemotherapy with Velcade at 1.3 mg per meter squared given on days 1, 4, 8 and 11 and Doxil at 30 mg per meter square given on day 4 and Decadron 40 mg by mouth on weekly basis given every 3 weeks. Status post 4 cycles. 6. Zometa 4 mg IV every 4 weeks.  7. Velcade 1.3 mg/M2 subcutaneous daily on a weekly basis with Decadron 20 mg by mouth on a weekly basis. First cycle expected on 06/21/2012. S/P 4 cycles.  CURRENT THERAPY:  1) Systemic chemotherapy with Carfilzomib, cyclophosphamide and Decadron. First cycle started on 08/09/2012. He is status post 18 cycles. 2) Zometa every 8 weeks.    INTERVAL HISTORY: Eddie Thomas 70 y.o. male returns to the clinic today for followup visit. He is currently on treatment with Carfilzomib, Cytoxan and dexamethasone status post 18 cycles and tolerating his treatment fairly well. He has no complaints today. He denied having any significant weight loss or night sweats. He has no fever or chills. He has no nausea or vomiting. The patient denied having any significant chest pain, shortness of breath, cough or hemoptysis. He is here today to start cycle #19 of his chemotherapy.  MEDICAL HISTORY: Past Medical History  Diagnosis Date  .  Multiple myeloma   . Diabetes mellitus 07/03/2011  . Hypertension 07/03/2011  . Hyperlipidemia 07/03/2011  . Colon polyps 2012  . Gastric ulcer     ALLERGIES:  has No Known Allergies.  MEDICATIONS:  Current Outpatient Prescriptions  Medication Sig Dispense Refill  . Cholecalciferol (VITAMIN D-3) 5000 UNITS TABS Take 1 tablet by mouth daily.    [name] lidocaine-prilocaine (EMLA) cream APPLY CREAM TOPICALLY TO AFFECTED AREA AS DIRECTED 30 g 1  . lisinopril (PRINIVIL,ZESTRIL) 10 MG tablet Take 5 mg by mouth every evening.     . pantoprazole (PROTONIX) 40 MG tablet Take 1 tablet (40 mg total) by mouth 2 (two) times daily. 60 tablet 11  . pioglitazone (ACTOS) 15 MG tablet Take 15 mg by mouth daily.    . simvastatin (ZOCOR) 10 MG tablet Take 10 mg by mouth at bedtime.      . valACYclovir (VALTREX) 500 MG tablet TAKE ONE TABLET BY MOUTH ONCE DAILY 30 tablet 3  . oxyCODONE-acetaminophen (PERCOCET/ROXICET) 5-325 MG per tablet Take 1 tablet by mouth every 6 (six) hours as needed for severe pain. (Patient not taking: Reported on 03/07/2014) 30 tablet 0  . prochlorperazine (COMPAZINE) 10 MG tablet Take 10 mg by mouth every 6 (six) hours as needed for nausea or vomiting.    . temazepam (RESTORIL) 15 MG capsule Take 15 mg by mouth at bedtime as needed for sleep.    [name] VIAGRA 50 MG tablet Take 50 mg by mouth daily as needed for erectile dysfunction.  No current facility-administered medications for this visit.    SURGICAL HISTORY:  Past Surgical History  Procedure Laterality Date  . Limbal stem cell transplant    . Humerus fracture surgery      right  . 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    REVIEW OF SYSTEMS:  Constitutional: positive for fatigue Eyes: negative Ears, nose, mouth, throat, and face: negative Respiratory: negative Cardiovascular: negative Gastrointestinal: negative Genitourinary:negative Integument/breast: negative Hematologic/lymphatic:  negative Musculoskeletal:positive for Bilateral hip pain Neurological: negative Behavioral/Psych: negative Endocrine: negative Allergic/Immunologic: negative   PHYSICAL EXAMINATION: General appearance: alert, cooperative and no distress Head: Normocephalic, without obvious abnormality, atraumatic Neck: no adenopathy, no JVD, supple, symmetrical, trachea midline and thyroid not enlarged, symmetric, no tenderness/mass/nodules Lymph nodes: Cervical, supraclavicular, and axillary nodes normal. Resp: clear to auscultation bilaterally Back: symmetric, no curvature. ROM normal. No CVA tenderness. Cardio: regular rate and rhythm, S1, S2 normal, no murmur, click, rub or gallop GI: soft, non-tender; bowel sounds normal; no masses,  no organomegaly Extremities: extremities normal, atraumatic, no cyanosis or edema Neurologic: Alert and oriented X 3, normal strength and tone. Normal symmetric reflexes. Normal coordination and gait  ECOG PERFORMANCE STATUS: 1 - Symptomatic but completely ambulatory  Blood pressure 123/67, pulse 69, temperature 97.8 F (36.6 C), temperature source Oral, resp. rate 18, height $RemoveBe'5\' 11"'fkgTmwnCg$  (1.803 m), weight 174 lb 6.4 oz (79.107 kg), SpO2 100 %.  LABORATORY DATA: Lab Results  Component Value Date   WBC 3.0* 03/07/2014   HGB 8.9* 03/07/2014   HCT 29.6* 03/07/2014   MCV 79.1* 03/07/2014   PLT 152 03/07/2014      Chemistry      Component Value Date/Time   NA 139 03/07/2014 0953   NA 139 08/30/2013 0935   K 4.1 03/07/2014 0953   K 4.1 08/30/2013 0935   CL 101 08/30/2013 0935   CL 103 07/12/2012 0907   CO2 24 03/07/2014 0953   CO2 26 08/30/2013 0935   BUN 12.2 03/07/2014 0953   BUN 11 08/30/2013 0935   CREATININE 1.0 03/07/2014 0953   CREATININE 1.03 08/30/2013 0935      Component Value Date/Time   CALCIUM 9.1 03/07/2014 0953   CALCIUM 9.2 08/30/2013 0935   ALKPHOS 37* 03/07/2014 0953   ALKPHOS 39 08/30/2013 0935   AST 20 03/07/2014 0953   AST 13  08/30/2013 0935   ALT 15 03/07/2014 0953   ALT 9 08/30/2013 0935   BILITOT 0.96 03/07/2014 0953   BILITOT 1.3* 08/30/2013 0935     Other lab results:  Beta-2 microglobulin 3.13, free Light chain 0.03, free lambda light chain 10.60 with a kappa/lambda ratio 0.00. IgG 121, IgA 1300 and IgM less than 4.  RADIOGRAPHIC STUDIES: No results found.  ASSESSMENT AND PLAN: This is a very pleasant 69 years old [place] American male with history of multiple myeloma currently undergoing systemic chemotherapy with Carfilzomib, cyclophosphamide and Decadron status post 18 cycles.  The patient is feeling fine and tolerating his treatment fairly well. We will proceed with cycle #19 today as scheduled. He would come back for followup visit in 1 month after repeating myeloma panel for evaluation of his disease. He'll continue on Zometa every 2 months. He was advised to call immediately if he has any concerning symptoms in the interval.  The patient voices understanding of current disease status and treatment options and is in agreement with the current care plan. All questions were answered. The patient knows to call the  clinic with any problems, questions or concerns. We can certainly see the patient much sooner if necessary.  Disclaimer: This note was dictated with voice recognition software. Similar sounding words can inadvertently be transcribed and may not be corrected upon review.

## 2014-03-07 NOTE — Patient Instructions (Addendum)
[facility] Discharge Instructions for Patients Receiving Chemotherapy  Today you received the following chemotherapy agents: Kyprolis and Cytoxan.  To help prevent nausea and vomiting after your treatment, we encourage you to take your nausea medication: Compazine 10 mg every 6 hours.   If you develop nausea and vomiting that is not controlled by your nausea medication, call the clinic.   BELOW ARE SYMPTOMS THAT SHOULD BE REPORTED IMMEDIATELY:  *FEVER GREATER THAN 100.5 F  *CHILLS WITH OR WITHOUT FEVER  NAUSEA AND VOMITING THAT IS NOT CONTROLLED WITH YOUR NAUSEA MEDICATION  *UNUSUAL SHORTNESS OF BREATH  *UNUSUAL BRUISING OR BLEEDING  TENDERNESS IN MOUTH AND THROAT WITH OR WITHOUT PRESENCE OF ULCERS  *URINARY PROBLEMS  *BOWEL PROBLEMS  UNUSUAL RASH Items with * indicate a potential emergency and should be followed up as soon as possible.  Feel free to call the clinic you have any questions or concerns. The clinic phone number is [phone].   Zoledronic Acid injection (Paget's Disease, Osteoporosis) What is this medicine? ZOLEDRONIC ACID (ZOE le dron ik AS id) lowers the amount of calcium loss from bone. It is used to treat Paget's disease and osteoporosis in women. This medicine may be used for other purposes; ask your health care provider or pharmacist if you have questions. COMMON BRAND NAME(S): Reclast, Zometa What should I tell my health care provider before I take this medicine? They need to know if you have any of these conditions: -aspirin-sensitive asthma -cancer, especially if you are receiving medicines used to treat cancer -dental disease or wear dentures -infection -kidney disease -low levels of calcium in the blood -past surgery on the parathyroid gland or intestines -receiving corticosteroids like dexamethasone or prednisone -an unusual or allergic reaction to zoledronic acid, other medicines, foods, dyes, or preservatives -pregnant or  trying to get pregnant -breast-feeding How should I use this medicine? This medicine is for infusion into a vein. It is given by a health care professional in a hospital or clinic setting. Talk to your pediatrician regarding the use of this medicine in children. This medicine is not approved for use in children. Overdosage: If you think you have taken too much of this medicine contact a poison control center or emergency room at once. NOTE: This medicine is only for you. Do not share this medicine with others. What if I miss a dose? It is important not to miss your dose. Call your doctor or health care professional if you are unable to keep an appointment. What may interact with this medicine? -certain antibiotics given by injection -NSAIDs, medicines for pain and inflammation, like ibuprofen or naproxen -some diuretics like bumetanide, furosemide -teriparatide This list may not describe all possible interactions. Give your health care provider a list of all the medicines, herbs, non-prescription drugs, or dietary supplements you use. Also tell them if you smoke, drink alcohol, or use illegal drugs. Some items may interact with your medicine. What should I watch for while using this medicine? Visit your doctor or health care professional for regular checkups. It may be some time before you see the benefit from this medicine. Do not stop taking your medicine unless your doctor tells you to. Your doctor may order blood tests or other tests to see how you are doing. Women should inform their doctor if they wish to become pregnant or think they might be pregnant. There is a potential for serious side effects to an unborn child. Talk to your health care professional or pharmacist for  more information. You should make sure that you get enough calcium and vitamin D while you are taking this medicine. Discuss the foods you eat and the vitamins you take with your health care professional. Some people who  take this medicine have severe bone, joint, and/or muscle pain. This medicine may also increase your risk for jaw problems or a broken thigh bone. Tell your doctor right away if you have severe pain in your jaw, bones, joints, or muscles. Tell your doctor if you have any pain that does not go away or that gets worse. Tell your dentist and dental surgeon that you are taking this medicine. You should not have major dental surgery while on this medicine. See your dentist to have a dental exam and fix any dental problems before starting this medicine. Take good care of your teeth while on this medicine. Make sure you see your dentist for regular follow-up appointments. What side effects may I notice from receiving this medicine? Side effects that you should report to your doctor or health care professional as soon as possible: -allergic reactions like skin rash, itching or hives, swelling of the face, lips, or tongue -anxiety, confusion, or depression -breathing problems -changes in vision -eye pain -feeling faint or lightheaded, falls -jaw pain, especially after dental work -mouth sores -muscle cramps, stiffness, or weakness -trouble passing urine or change in the amount of urine Side effects that usually do not require medical attention (report to your doctor or health care professional if they continue or are bothersome): -bone, joint, or muscle pain -constipation -diarrhea -fever -hair loss -irritation at site where injected -loss of appetite -nausea, vomiting -stomach upset -trouble sleeping -trouble swallowing -weak or tired This list may not describe all possible side effects. Call your doctor for medical advice about side effects. You may report side effects to FDA at 1-800-FDA-1088. Where should I keep my medicine? This drug is given in a hospital or clinic and will not be stored at home. NOTE: This sheet is a summary. It may not cover all possible information. If you have  questions about this medicine, talk to your doctor, pharmacist, or health care provider.  2015, [employer]vier/Gold Standard. (2012-06-21 10:03:48)

## 2014-03-08 ENCOUNTER — Ambulatory Visit (HOSPITAL_BASED_OUTPATIENT_CLINIC_OR_DEPARTMENT_OTHER): Payer: Medicare Other

## 2014-03-08 ENCOUNTER — Telehealth: Payer: Self-pay | Admitting: Internal Medicine

## 2014-03-08 ENCOUNTER — Other Ambulatory Visit: Payer: Self-pay | Admitting: *Deleted

## 2014-03-08 DIAGNOSIS — Z5112 Encounter for antineoplastic immunotherapy: Secondary | ICD-10-CM

## 2014-03-08 DIAGNOSIS — C9000 Multiple myeloma not having achieved remission: Secondary | ICD-10-CM

## 2014-03-08 MED ORDER — ONDANSETRON 8 MG/NS 50 ML IVPB
INTRAVENOUS | Status: AC
  Filled 2014-03-08: qty 8

## 2014-03-08 MED ORDER — DEXAMETHASONE SODIUM PHOSPHATE 10 MG/ML IJ SOLN
INTRAMUSCULAR | Status: AC
  Filled 2014-03-08: qty 1

## 2014-03-08 MED ORDER — SODIUM CHLORIDE 0.9 % IV SOLN
Freq: Once | INTRAVENOUS | Status: AC
  Administered 2014-03-08: 11:00:00 via INTRAVENOUS

## 2014-03-08 MED ORDER — ONDANSETRON 8 MG/50ML IVPB (CHCC)
8.0000 mg | Freq: Once | INTRAVENOUS | Status: AC
  Administered 2014-03-08: 8 mg via INTRAVENOUS

## 2014-03-08 MED ORDER — DEXAMETHASONE SODIUM PHOSPHATE 10 MG/ML IJ SOLN
10.0000 mg | Freq: Once | INTRAMUSCULAR | Status: AC
  Administered 2014-03-08: 10 mg via INTRAVENOUS

## 2014-03-08 MED ORDER — SODIUM CHLORIDE 0.9 % IJ SOLN
10.0000 mL | INTRAMUSCULAR | Status: DC | PRN
  Administered 2014-03-08: 10 mL
  Filled 2014-03-08: qty 10

## 2014-03-08 MED ORDER — HEPARIN SOD (PORK) LOCK FLUSH 100 UNIT/ML IV SOLN
500.0000 [IU] | Freq: Once | INTRAVENOUS | Status: AC | PRN
  Administered 2014-03-08: 500 [IU]
  Filled 2014-03-08: qty 5

## 2014-03-08 MED ORDER — DEXTROSE 5 % IV SOLN
27.0000 mg/m2 | Freq: Once | INTRAVENOUS | Status: AC
  Administered 2014-03-08: 54 mg via INTRAVENOUS
  Filled 2014-03-08: qty 27

## 2014-03-08 NOTE — Patient Instructions (Signed)
[facility] Discharge Instructions for Patients Receiving Chemotherapy  Today you received the following chemotherapy agents Kyprolis.  To help prevent nausea and vomiting after your treatment, we encourage you to take your nausea medication.   If you develop nausea and vomiting that is not controlled by your nausea medication, call the clinic.   BELOW ARE SYMPTOMS THAT SHOULD BE REPORTED IMMEDIATELY:  *FEVER GREATER THAN 100.5 F  *CHILLS WITH OR WITHOUT FEVER  NAUSEA AND VOMITING THAT IS NOT CONTROLLED WITH YOUR NAUSEA MEDICATION  *UNUSUAL SHORTNESS OF BREATH  *UNUSUAL BRUISING OR BLEEDING  TENDERNESS IN MOUTH AND THROAT WITH OR WITHOUT PRESENCE OF ULCERS  *URINARY PROBLEMS  *BOWEL PROBLEMS  UNUSUAL RASH Items with * indicate a potential emergency and should be followed up as soon as possible.  Feel free to call the clinic you have any questions or concerns. The clinic phone number is [phone].    

## 2014-03-08 NOTE — Telephone Encounter (Signed)
, °

## 2014-03-14 ENCOUNTER — Other Ambulatory Visit: Payer: Self-pay | Admitting: Physician Assistant

## 2014-03-14 ENCOUNTER — Other Ambulatory Visit (HOSPITAL_BASED_OUTPATIENT_CLINIC_OR_DEPARTMENT_OTHER): Payer: Medicare Other

## 2014-03-14 ENCOUNTER — Ambulatory Visit (HOSPITAL_BASED_OUTPATIENT_CLINIC_OR_DEPARTMENT_OTHER): Payer: Medicare Other

## 2014-03-14 DIAGNOSIS — Z5112 Encounter for antineoplastic immunotherapy: Secondary | ICD-10-CM

## 2014-03-14 DIAGNOSIS — C9000 Multiple myeloma not having achieved remission: Secondary | ICD-10-CM

## 2014-03-14 DIAGNOSIS — Z5111 Encounter for antineoplastic chemotherapy: Secondary | ICD-10-CM

## 2014-03-14 LAB — CBC WITH DIFFERENTIAL/PLATELET
BASO%: 0 % (ref 0.0–2.0)
BASOS ABS: 0 10*3/uL (ref 0.0–0.1)
EOS%: 0.7 % (ref 0.0–7.0)
Eosinophils Absolute: 0 10*3/uL (ref 0.0–0.5)
HCT: 26.6 % — ABNORMAL LOW (ref 38.4–49.9)
HGB: 8.3 g/dL — ABNORMAL LOW (ref 13.0–17.1)
LYMPH%: 12.5 % — AB (ref 14.0–49.0)
MCH: 24.3 pg — ABNORMAL LOW (ref 27.2–33.4)
MCHC: 31.2 g/dL — ABNORMAL LOW (ref 32.0–36.0)
MCV: 78 fL — AB (ref 79.3–98.0)
MONO#: 0.3 10*3/uL (ref 0.1–0.9)
MONO%: 8.4 % (ref 0.0–14.0)
NEUT#: 2.3 10*3/uL (ref 1.5–6.5)
NEUT%: 78.4 % — ABNORMAL HIGH (ref 39.0–75.0)
NRBC: 1 % — AB (ref 0–0)
PLATELETS: 80 10*3/uL — AB (ref 140–400)
RBC: 3.41 10*6/uL — AB (ref 4.20–5.82)
RDW: 15.5 % — ABNORMAL HIGH (ref 11.0–14.6)
WBC: 3 10*3/uL — ABNORMAL LOW (ref 4.0–10.3)
lymph#: 0.4 10*3/uL — ABNORMAL LOW (ref 0.9–3.3)

## 2014-03-14 LAB — COMPREHENSIVE METABOLIC PANEL (CC13)
ALK PHOS: 37 U/L — AB (ref 40–150)
ALT: 8 U/L (ref 0–55)
AST: 16 U/L (ref 5–34)
Albumin: 3.2 g/dL — ABNORMAL LOW (ref 3.5–5.0)
Anion Gap: 8 mEq/L (ref 3–11)
BILIRUBIN TOTAL: 0.95 mg/dL (ref 0.20–1.20)
BUN: 10.8 mg/dL (ref 7.0–26.0)
CO2: 24 mEq/L (ref 22–29)
Calcium: 9 mg/dL (ref 8.4–10.4)
Chloride: 107 mEq/L (ref 98–109)
Creatinine: 0.9 mg/dL (ref 0.7–1.3)
EGFR: >90 mL/min/{1.73_m2} (ref >90)
Glucose: 133 mg/dl (ref 70–140)
Potassium: 4 mEq/L (ref 3.5–5.1)
Sodium: 138 mEq/L (ref 136–145)
TOTAL PROTEIN: 6.6 g/dL (ref 6.4–8.3)

## 2014-03-14 MED ORDER — SODIUM CHLORIDE 0.9 % IV SOLN
Freq: Once | INTRAVENOUS | Status: AC
  Administered 2014-03-14: 09:00:00 via INTRAVENOUS

## 2014-03-14 MED ORDER — HEPARIN SOD (PORK) LOCK FLUSH 100 UNIT/ML IV SOLN
500.0000 [IU] | Freq: Once | INTRAVENOUS | Status: AC | PRN
  Administered 2014-03-14: 500 [IU]
  Filled 2014-03-14: qty 5

## 2014-03-14 MED ORDER — CYCLOPHOSPHAMIDE CHEMO INJECTION 1 GM
300.0000 mg/m2 | Freq: Once | INTRAMUSCULAR | Status: AC
  Administered 2014-03-14: 600 mg via INTRAVENOUS
  Filled 2014-03-14: qty 30

## 2014-03-14 MED ORDER — ONDANSETRON 8 MG/NS 50 ML IVPB
INTRAVENOUS | Status: AC
  Filled 2014-03-14: qty 8

## 2014-03-14 MED ORDER — DEXAMETHASONE SODIUM PHOSPHATE 10 MG/ML IJ SOLN
10.0000 mg | Freq: Once | INTRAMUSCULAR | Status: AC
  Administered 2014-03-14: 10 mg via INTRAVENOUS

## 2014-03-14 MED ORDER — SODIUM CHLORIDE 0.9 % IJ SOLN
10.0000 mL | INTRAMUSCULAR | Status: DC | PRN
  Administered 2014-03-14: 10 mL
  Filled 2014-03-14: qty 10

## 2014-03-14 MED ORDER — DEXTROSE 5 % IV SOLN
27.0000 mg/m2 | Freq: Once | INTRAVENOUS | Status: AC
  Administered 2014-03-14: 54 mg via INTRAVENOUS
  Filled 2014-03-14: qty 27

## 2014-03-14 MED ORDER — ONDANSETRON 8 MG/50ML IVPB (CHCC)
8.0000 mg | Freq: Once | INTRAVENOUS | Status: AC
  Administered 2014-03-14: 8 mg via INTRAVENOUS

## 2014-03-14 MED ORDER — DEXAMETHASONE SODIUM PHOSPHATE 10 MG/ML IJ SOLN
INTRAMUSCULAR | Status: AC
  Filled 2014-03-14: qty 1

## 2014-03-14 NOTE — Patient Instructions (Signed)
[facility] Discharge Instructions for Patients Receiving Chemotherapy  Today you received the following chemotherapy agents Kyprolis and Cytoxan.  To help prevent nausea and vomiting after your treatment, we encourage you to take your nausea medication.   If you develop nausea and vomiting that is not controlled by your nausea medication, call the clinic.   BELOW ARE SYMPTOMS THAT SHOULD BE REPORTED IMMEDIATELY:  *FEVER GREATER THAN 100.5 F  *CHILLS WITH OR WITHOUT FEVER  NAUSEA AND VOMITING THAT IS NOT CONTROLLED WITH YOUR NAUSEA MEDICATION  *UNUSUAL SHORTNESS OF BREATH  *UNUSUAL BRUISING OR BLEEDING  TENDERNESS IN MOUTH AND THROAT WITH OR WITHOUT PRESENCE OF ULCERS  *URINARY PROBLEMS  *BOWEL PROBLEMS  UNUSUAL RASH Items with * indicate a potential emergency and should be followed up as soon as possible.  Feel free to call the clinic you have any questions or concerns. The clinic phone number is [phone].    

## 2014-03-14 NOTE — Progress Notes (Signed)
Ok to treat per Dr Julien Nordmann with platelets of 80.

## 2014-03-15 ENCOUNTER — Ambulatory Visit (HOSPITAL_BASED_OUTPATIENT_CLINIC_OR_DEPARTMENT_OTHER): Payer: Medicare Other

## 2014-03-15 DIAGNOSIS — C9000 Multiple myeloma not having achieved remission: Secondary | ICD-10-CM

## 2014-03-15 DIAGNOSIS — Z5112 Encounter for antineoplastic immunotherapy: Secondary | ICD-10-CM

## 2014-03-15 MED ORDER — SODIUM CHLORIDE 0.9 % IV SOLN
Freq: Once | INTRAVENOUS | Status: AC
  Administered 2014-03-15: 08:00:00 via INTRAVENOUS

## 2014-03-15 MED ORDER — ONDANSETRON 8 MG/50ML IVPB (CHCC)
8.0000 mg | Freq: Once | INTRAVENOUS | Status: AC
  Administered 2014-03-15: 8 mg via INTRAVENOUS

## 2014-03-15 MED ORDER — DEXAMETHASONE SODIUM PHOSPHATE 10 MG/ML IJ SOLN
INTRAMUSCULAR | Status: AC
  Filled 2014-03-15: qty 1

## 2014-03-15 MED ORDER — ONDANSETRON 8 MG/NS 50 ML IVPB
INTRAVENOUS | Status: AC
  Filled 2014-03-15: qty 8

## 2014-03-15 MED ORDER — DEXTROSE 5 % IV SOLN
27.0000 mg/m2 | Freq: Once | INTRAVENOUS | Status: AC
  Administered 2014-03-15: 54 mg via INTRAVENOUS
  Filled 2014-03-15: qty 27

## 2014-03-15 MED ORDER — SODIUM CHLORIDE 0.9 % IJ SOLN
10.0000 mL | INTRAMUSCULAR | Status: DC | PRN
  Administered 2014-03-15: 10 mL
  Filled 2014-03-15: qty 10

## 2014-03-15 MED ORDER — DEXAMETHASONE SODIUM PHOSPHATE 10 MG/ML IJ SOLN
10.0000 mg | Freq: Once | INTRAMUSCULAR | Status: AC
  Administered 2014-03-15: 10 mg via INTRAVENOUS

## 2014-03-15 MED ORDER — HEPARIN SOD (PORK) LOCK FLUSH 100 UNIT/ML IV SOLN
500.0000 [IU] | Freq: Once | INTRAVENOUS | Status: AC | PRN
  Administered 2014-03-15: 500 [IU]
  Filled 2014-03-15: qty 5

## 2014-03-15 NOTE — Patient Instructions (Signed)
[facility] Discharge Instructions for Patients Receiving Chemotherapy  Today you received the following chemotherapy agents Kyprolis To help prevent nausea and vomiting after your treatment, we encourage you to take your nausea medication as prescribed.  If you develop nausea and vomiting that is not controlled by your nausea medication, call the clinic.   BELOW ARE SYMPTOMS THAT SHOULD BE REPORTED IMMEDIATELY:  *FEVER GREATER THAN 100.5 F  *CHILLS WITH OR WITHOUT FEVER  NAUSEA AND VOMITING THAT IS NOT CONTROLLED WITH YOUR NAUSEA MEDICATION  *UNUSUAL SHORTNESS OF BREATH  *UNUSUAL BRUISING OR BLEEDING  TENDERNESS IN MOUTH AND THROAT WITH OR WITHOUT PRESENCE OF ULCERS  *URINARY PROBLEMS  *BOWEL PROBLEMS  UNUSUAL RASH Items with * indicate a potential emergency and should be followed up as soon as possible.  Feel free to call the clinic you have any questions or concerns. The clinic phone number is [phone].    

## 2014-03-21 ENCOUNTER — Other Ambulatory Visit: Payer: Medicare Other

## 2014-03-21 ENCOUNTER — Ambulatory Visit (HOSPITAL_BASED_OUTPATIENT_CLINIC_OR_DEPARTMENT_OTHER): Payer: Medicare Other

## 2014-03-21 ENCOUNTER — Other Ambulatory Visit (HOSPITAL_BASED_OUTPATIENT_CLINIC_OR_DEPARTMENT_OTHER): Payer: Medicare Other

## 2014-03-21 DIAGNOSIS — C9000 Multiple myeloma not having achieved remission: Secondary | ICD-10-CM

## 2014-03-21 DIAGNOSIS — Z5111 Encounter for antineoplastic chemotherapy: Secondary | ICD-10-CM | POA: Diagnosis not present

## 2014-03-21 DIAGNOSIS — Z5112 Encounter for antineoplastic immunotherapy: Secondary | ICD-10-CM

## 2014-03-21 LAB — CBC WITH DIFFERENTIAL/PLATELET
BASO%: 0.5 % (ref 0.0–2.0)
Basophils Absolute: 0 10*3/uL (ref 0.0–0.1)
EOS%: 0.5 % (ref 0.0–7.0)
Eosinophils Absolute: 0 10*3/uL (ref 0.0–0.5)
HEMATOCRIT: 27.4 % — AB (ref 38.4–49.9)
HGB: 8.4 g/dL — ABNORMAL LOW (ref 13.0–17.1)
LYMPH%: 10.6 % — AB (ref 14.0–49.0)
MCH: 24 pg — ABNORMAL LOW (ref 27.2–33.4)
MCHC: 30.6 g/dL — AB (ref 32.0–36.0)
MCV: 78.3 fL — AB (ref 79.3–98.0)
MONO#: 0.3 10*3/uL (ref 0.1–0.9)
MONO%: 8.4 % (ref 0.0–14.0)
NEUT#: 2.7 10*3/uL (ref 1.5–6.5)
NEUT%: 80 % — AB (ref 39.0–75.0)
Platelets: 126 10*3/uL — ABNORMAL LOW (ref 140–400)
RBC: 3.5 10*6/uL — AB (ref 4.20–5.82)
RDW: 15.4 % — ABNORMAL HIGH (ref 11.0–14.6)
WBC: 3.4 10*3/uL — AB (ref 4.0–10.3)
lymph#: 0.4 10*3/uL — ABNORMAL LOW (ref 0.9–3.3)

## 2014-03-21 LAB — COMPREHENSIVE METABOLIC PANEL (CC13)
ALK PHOS: 38 U/L — AB (ref 40–150)
ALT: 10 U/L (ref 0–55)
ANION GAP: 9 meq/L (ref 3–11)
AST: 16 U/L (ref 5–34)
Albumin: 3.3 g/dL — ABNORMAL LOW (ref 3.5–5.0)
BUN: 11.5 mg/dL (ref 7.0–26.0)
CO2: 25 mEq/L (ref 22–29)
Calcium: 8.9 mg/dL (ref 8.4–10.4)
Chloride: 106 mEq/L (ref 98–109)
Creatinine: 0.9 mg/dL (ref 0.7–1.3)
EGFR: >90 mL/min/{1.73_m2} (ref >90)
Glucose: 118 mg/dl (ref 70–140)
Potassium: 3.9 mEq/L (ref 3.5–5.1)
SODIUM: 140 meq/L (ref 136–145)
TOTAL PROTEIN: 6.8 g/dL (ref 6.4–8.3)
Total Bilirubin: 1.1 mg/dL (ref 0.20–1.20)

## 2014-03-21 MED ORDER — SODIUM CHLORIDE 0.9 % IV SOLN
Freq: Once | INTRAVENOUS | Status: AC
  Administered 2014-03-21: 09:00:00 via INTRAVENOUS

## 2014-03-21 MED ORDER — DEXAMETHASONE SODIUM PHOSPHATE 10 MG/ML IJ SOLN
10.0000 mg | Freq: Once | INTRAMUSCULAR | Status: AC
  Administered 2014-03-21: 10 mg via INTRAVENOUS

## 2014-03-21 MED ORDER — ONDANSETRON 8 MG/50ML IVPB (CHCC)
8.0000 mg | Freq: Once | INTRAVENOUS | Status: AC
  Administered 2014-03-21: 8 mg via INTRAVENOUS

## 2014-03-21 MED ORDER — HEPARIN SOD (PORK) LOCK FLUSH 100 UNIT/ML IV SOLN
500.0000 [IU] | Freq: Once | INTRAVENOUS | Status: AC | PRN
  Administered 2014-03-21: 500 [IU]
  Filled 2014-03-21: qty 5

## 2014-03-21 MED ORDER — SODIUM CHLORIDE 0.9 % IJ SOLN
10.0000 mL | INTRAMUSCULAR | Status: DC | PRN
  Administered 2014-03-21: 10 mL
  Filled 2014-03-21: qty 10

## 2014-03-21 MED ORDER — SODIUM CHLORIDE 0.9 % IV SOLN
300.0000 mg/m2 | Freq: Once | INTRAVENOUS | Status: AC
  Administered 2014-03-21: 600 mg via INTRAVENOUS
  Filled 2014-03-21: qty 30

## 2014-03-21 MED ORDER — DEXAMETHASONE SODIUM PHOSPHATE 10 MG/ML IJ SOLN
INTRAMUSCULAR | Status: AC
  Filled 2014-03-21: qty 1

## 2014-03-21 MED ORDER — DEXTROSE 5 % IV SOLN
27.0000 mg/m2 | Freq: Once | INTRAVENOUS | Status: AC
  Administered 2014-03-21: 54 mg via INTRAVENOUS
  Filled 2014-03-21: qty 27

## 2014-03-21 MED ORDER — ONDANSETRON 8 MG/NS 50 ML IVPB
INTRAVENOUS | Status: AC
  Filled 2014-03-21: qty 8

## 2014-03-21 NOTE — Patient Instructions (Signed)
[facility] Discharge Instructions for Patients Receiving Chemotherapy  Today you received the following chemotherapy agents Cytoxan and Kyprolis.  To help prevent nausea and vomiting after your treatment, we encourage you to take your nausea medication as prescribed.   If you develop nausea and vomiting that is not controlled by your nausea medication, call the clinic.   BELOW ARE SYMPTOMS THAT SHOULD BE REPORTED IMMEDIATELY:  *FEVER GREATER THAN 100.5 F  *CHILLS WITH OR WITHOUT FEVER  NAUSEA AND VOMITING THAT IS NOT CONTROLLED WITH YOUR NAUSEA MEDICATION  *UNUSUAL SHORTNESS OF BREATH  *UNUSUAL BRUISING OR BLEEDING  TENDERNESS IN MOUTH AND THROAT WITH OR WITHOUT PRESENCE OF ULCERS  *URINARY PROBLEMS  *BOWEL PROBLEMS  UNUSUAL RASH Items with * indicate a potential emergency and should be followed up as soon as possible.  Feel free to call the clinic you have any questions or concerns. The clinic phone number is [phone].    

## 2014-03-21 NOTE — Progress Notes (Signed)
Hgb 8.4 today. Patient asymptomatic of anemia. OK to treat with cytoxan and kyprolis per Dr. Julien Nordmann.

## 2014-03-22 ENCOUNTER — Ambulatory Visit (HOSPITAL_BASED_OUTPATIENT_CLINIC_OR_DEPARTMENT_OTHER): Payer: Medicare Other

## 2014-03-22 ENCOUNTER — Encounter: Payer: Self-pay | Admitting: *Deleted

## 2014-03-22 DIAGNOSIS — Z5112 Encounter for antineoplastic immunotherapy: Secondary | ICD-10-CM

## 2014-03-22 DIAGNOSIS — C9000 Multiple myeloma not having achieved remission: Secondary | ICD-10-CM

## 2014-03-22 MED ORDER — DEXAMETHASONE SODIUM PHOSPHATE 10 MG/ML IJ SOLN
INTRAMUSCULAR | Status: AC
Start: 2014-03-22 — End: 2014-03-22
  Filled 2014-03-22: qty 1

## 2014-03-22 MED ORDER — HEPARIN SOD (PORK) LOCK FLUSH 100 UNIT/ML IV SOLN
500.0000 [IU] | Freq: Once | INTRAVENOUS | Status: AC | PRN
  Administered 2014-03-22: 500 [IU]
  Filled 2014-03-22: qty 5

## 2014-03-22 MED ORDER — ONDANSETRON 8 MG/50ML IVPB (CHCC)
8.0000 mg | Freq: Once | INTRAVENOUS | Status: AC
  Administered 2014-03-22: 8 mg via INTRAVENOUS

## 2014-03-22 MED ORDER — SODIUM CHLORIDE 0.9 % IV SOLN: Freq: Once | INTRAVENOUS | Status: DC

## 2014-03-22 MED ORDER — ONDANSETRON 8 MG/NS 50 ML IVPB
INTRAVENOUS | Status: AC
Start: 2014-03-22 — End: 2014-03-22
  Filled 2014-03-22: qty 8

## 2014-03-22 MED ORDER — SODIUM CHLORIDE 0.9 % IV SOLN
Freq: Once | INTRAVENOUS | Status: AC
  Administered 2014-03-22: 09:00:00 via INTRAVENOUS

## 2014-03-22 MED ORDER — SODIUM CHLORIDE 0.9 % IJ SOLN
10.0000 mL | INTRAMUSCULAR | Status: DC | PRN
  Administered 2014-03-22: 10 mL
  Filled 2014-03-22: qty 10

## 2014-03-22 MED ORDER — DEXTROSE 5 % IV SOLN
27.0000 mg/m2 | Freq: Once | INTRAVENOUS | Status: AC
  Administered 2014-03-22: 54 mg via INTRAVENOUS
  Filled 2014-03-22: qty 27

## 2014-03-22 MED ORDER — DEXAMETHASONE SODIUM PHOSPHATE 10 MG/ML IJ SOLN
10.0000 mg | Freq: Once | INTRAMUSCULAR | Status: AC
  Administered 2014-03-22: 10 mg via INTRAVENOUS

## 2014-03-22 NOTE — Patient Instructions (Signed)
[facility] Discharge Instructions for Patients Receiving Chemotherapy  Today you received the following chemotherapy agents: Kyprolis  To help prevent nausea and vomiting after your treatment, we encourage you to take your nausea medication: as directed.   If you develop nausea and vomiting that is not controlled by your nausea medication, call the clinic.   BELOW ARE SYMPTOMS THAT SHOULD BE REPORTED IMMEDIATELY:  *FEVER GREATER THAN 100.5 F  *CHILLS WITH OR WITHOUT FEVER  NAUSEA AND VOMITING THAT IS NOT CONTROLLED WITH YOUR NAUSEA MEDICATION  *UNUSUAL SHORTNESS OF BREATH  *UNUSUAL BRUISING OR BLEEDING  TENDERNESS IN MOUTH AND THROAT WITH OR WITHOUT PRESENCE OF ULCERS  *URINARY PROBLEMS  *BOWEL PROBLEMS  UNUSUAL RASH Items with * indicate a potential emergency and should be followed up as soon as possible.  Feel free to call the clinic you have any questions or concerns. The clinic phone number is [phone].    

## 2014-03-28 ENCOUNTER — Other Ambulatory Visit (HOSPITAL_BASED_OUTPATIENT_CLINIC_OR_DEPARTMENT_OTHER): Payer: Medicare Other

## 2014-03-28 DIAGNOSIS — C9000 Multiple myeloma not having achieved remission: Secondary | ICD-10-CM | POA: Diagnosis not present

## 2014-03-28 LAB — COMPREHENSIVE METABOLIC PANEL (CC13)
ALT: 10 U/L (ref 0–55)
AST: 17 U/L (ref 5–34)
Albumin: 3.3 g/dL — ABNORMAL LOW (ref 3.5–5.0)
Alkaline Phosphatase: 35 U/L — ABNORMAL LOW (ref 40–150)
Anion Gap: 10 mEq/L (ref 3–11)
BUN: 12.7 mg/dL (ref 7.0–26.0)
CALCIUM: 8.8 mg/dL (ref 8.4–10.4)
CHLORIDE: 104 meq/L (ref 98–109)
CO2: 23 meq/L (ref 22–29)
Creatinine: 0.9 mg/dL (ref 0.7–1.3)
Glucose: 121 mg/dl (ref 70–140)
Potassium: 4 mEq/L (ref 3.5–5.1)
Sodium: 138 mEq/L (ref 136–145)
Total Bilirubin: 1.09 mg/dL (ref 0.20–1.20)
Total Protein: 6.8 g/dL (ref 6.4–8.3)

## 2014-03-28 LAB — CBC WITH DIFFERENTIAL/PLATELET
BASO%: 0.3 % (ref 0.0–2.0)
Basophils Absolute: 0 10*3/uL (ref 0.0–0.1)
EOS ABS: 0 10*3/uL (ref 0.0–0.5)
EOS%: 0.3 % (ref 0.0–7.0)
HCT: 26.3 % — ABNORMAL LOW (ref 38.4–49.9)
HGB: 8.3 g/dL — ABNORMAL LOW (ref 13.0–17.1)
LYMPH%: 13.7 % — AB (ref 14.0–49.0)
MCH: 24.6 pg — AB (ref 27.2–33.4)
MCHC: 31.6 g/dL — AB (ref 32.0–36.0)
MCV: 78 fL — AB (ref 79.3–98.0)
MONO#: 0.4 10*3/uL (ref 0.1–0.9)
MONO%: 11.2 % (ref 0.0–14.0)
NEUT#: 2.4 10*3/uL (ref 1.5–6.5)
NEUT%: 74.5 % (ref 39.0–75.0)
NRBC: 0 % (ref 0–0)
RBC: 3.37 10*6/uL — AB (ref 4.20–5.82)
RDW: 15.7 % — ABNORMAL HIGH (ref 11.0–14.6)
WBC: 3.2 10*3/uL — ABNORMAL LOW (ref 4.0–10.3)
lymph#: 0.4 10*3/uL — ABNORMAL LOW (ref 0.9–3.3)

## 2014-03-28 LAB — TECHNOLOGIST REVIEW

## 2014-03-28 LAB — LACTATE DEHYDROGENASE (CC13): LDH: 220 U/L (ref 125–245)

## 2014-03-30 LAB — BETA 2 MICROGLOBULIN, SERUM: BETA 2 MICROGLOBULIN: 3.79 mg/L — AB (ref <=2.51)

## 2014-03-30 LAB — KAPPA/LAMBDA LIGHT CHAINS
Kappa:Lambda Ratio: 0 — ABNORMAL LOW (ref 0.26–1.65)
Lambda Free Lght Chn: 15.2 mg/dL — ABNORMAL HIGH (ref 0.57–2.63)

## 2014-03-30 LAB — IGG, IGA, IGM
IgA: 1880 mg/dL — ABNORMAL HIGH (ref 68–379)
IgG (Immunoglobin G), Serum: 114 mg/dL — ABNORMAL LOW (ref 650–1600)
IgM, Serum: <5 mg/dL — ABNORMAL LOW (ref 41–251)

## 2014-04-04 ENCOUNTER — Telehealth: Payer: Self-pay | Admitting: *Deleted

## 2014-04-04 ENCOUNTER — Ambulatory Visit: Payer: Medicare Other

## 2014-04-04 ENCOUNTER — Telehealth: Payer: Self-pay | Admitting: Internal Medicine

## 2014-04-04 ENCOUNTER — Encounter: Payer: Self-pay | Admitting: Internal Medicine

## 2014-04-04 ENCOUNTER — Ambulatory Visit (HOSPITAL_BASED_OUTPATIENT_CLINIC_OR_DEPARTMENT_OTHER): Payer: Medicare Other | Admitting: Internal Medicine

## 2014-04-04 ENCOUNTER — Other Ambulatory Visit (HOSPITAL_BASED_OUTPATIENT_CLINIC_OR_DEPARTMENT_OTHER): Payer: Medicare Other

## 2014-04-04 ENCOUNTER — Telehealth: Payer: Self-pay | Admitting: Medical Oncology

## 2014-04-04 VITALS — BP 138/70 | HR 71 | Temp 97.8°F | Resp 18 | Ht 71.0 in | Wt 178.2 lb

## 2014-04-04 DIAGNOSIS — C9000 Multiple myeloma not having achieved remission: Secondary | ICD-10-CM

## 2014-04-04 DIAGNOSIS — E119 Type 2 diabetes mellitus without complications: Secondary | ICD-10-CM

## 2014-04-04 DIAGNOSIS — I10 Essential (primary) hypertension: Secondary | ICD-10-CM

## 2014-04-04 LAB — CBC WITH DIFFERENTIAL/PLATELET
BASO%: 0.4 % (ref 0.0–2.0)
Basophils Absolute: 0 10*3/uL (ref 0.0–0.1)
EOS ABS: 0 10*3/uL (ref 0.0–0.5)
EOS%: 0.8 % (ref 0.0–7.0)
HCT: 26.5 % — ABNORMAL LOW (ref 38.4–49.9)
HGB: 8.4 g/dL — ABNORMAL LOW (ref 13.0–17.1)
LYMPH#: 0.6 10*3/uL — AB (ref 0.9–3.3)
LYMPH%: 23.3 % (ref 14.0–49.0)
MCH: 24.6 pg — AB (ref 27.2–33.4)
MCHC: 31.7 g/dL — ABNORMAL LOW (ref 32.0–36.0)
MCV: 77.7 fL — AB (ref 79.3–98.0)
MONO#: 0.2 10*3/uL (ref 0.1–0.9)
MONO%: 9.3 % (ref 0.0–14.0)
NEUT#: 1.7 10*3/uL (ref 1.5–6.5)
NEUT%: 66.2 % (ref 39.0–75.0)
NRBC: 0 % (ref 0–0)
Platelets: 116 10*3/uL — ABNORMAL LOW (ref 140–400)
RBC: 3.41 10*6/uL — AB (ref 4.20–5.82)
RDW: 16.4 % — AB (ref 11.0–14.6)
WBC: 2.6 10*3/uL — ABNORMAL LOW (ref 4.0–10.3)

## 2014-04-04 LAB — TECHNOLOGIST REVIEW

## 2014-04-04 LAB — COMPREHENSIVE METABOLIC PANEL (CC13)
ALT: 13 U/L (ref 0–55)
AST: 20 U/L (ref 5–34)
Albumin: 3.3 g/dL — ABNORMAL LOW (ref 3.5–5.0)
Alkaline Phosphatase: 32 U/L — ABNORMAL LOW (ref 40–150)
Anion Gap: 9 mEq/L (ref 3–11)
BILIRUBIN TOTAL: 1.18 mg/dL (ref 0.20–1.20)
BUN: 8.4 mg/dL (ref 7.0–26.0)
CO2: 25 mEq/L (ref 22–29)
CREATININE: 1 mg/dL (ref 0.7–1.3)
Calcium: 8.8 mg/dL (ref 8.4–10.4)
Chloride: 107 mEq/L (ref 98–109)
EGFR: >90 mL/min/{1.73_m2} (ref >90)
GLUCOSE: 103 mg/dL (ref 70–140)
Potassium: 3.8 mEq/L (ref 3.5–5.1)
Sodium: 141 mEq/L (ref 136–145)
Total Protein: 6.8 g/dL (ref 6.4–8.3)

## 2014-04-04 MED ORDER — POMALIDOMIDE 4 MG PO CAPS: 4.0000 mg | ORAL_CAPSULE | Freq: Every day | ORAL | Status: DC

## 2014-04-04 NOTE — Telephone Encounter (Signed)
Per staff message and POF I have scheduled appts. Advised scheduler of appts. JMW  

## 2014-04-04 NOTE — Telephone Encounter (Signed)
pt came in and needed 3.14 appt changed to later time...done.Marland KitchenMarland KitchenMarland Kitchen

## 2014-04-04 NOTE — Telephone Encounter (Signed)
Pt confirmed labs/ov per 03/15 POF, gave pt AVS..... KJ, sent msg to add chemo °

## 2014-04-04 NOTE — Telephone Encounter (Signed)
Pomalyst sent to [facility] speciality

## 2014-04-04 NOTE — Progress Notes (Signed)
[name] [phone]   [phone]  OFFICE PROGRESS NOTE  Eddie Thomas [address] 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   PRIOR THERAPY: :  1. Status post 6 cycles of systemic chemotherapy with Revlimid and Decadron, last dose was given 07/21/2010 with very good response. 2. 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 [name].  3. maintenance Revlimid at 10 mg by mouth daily status post 2 months. Therapy began 01/18/2011. 4. maintenance Revlimid at 15 mg by mouth daily with prophylactic dose Coumadin at 2 mg by mouth daily. 5. Systemic chemotherapy with Velcade at 1.3 mg per meter squared given on days 1, 4, 8 and 11 and Doxil at 30 mg per meter square given on day 4 and Decadron 40 mg by mouth on weekly basis given every 3 weeks. Status post 4 cycles. 6. Zometa 4 mg IV every 4 weeks.  7. Velcade 1.3 mg/M2 subcutaneous daily on a weekly basis with Decadron 20 mg by mouth on a weekly basis. First cycle expected on 06/21/2012. S/P 4 cycles. 8. Systemic chemotherapy with Carfilzomib, cyclophosphamide and Decadron. First cycle started on 08/09/2012. He is status post 19 cycles.   CURRENT THERAPY:  1) Systemic chemotherapy with Carfilzomib 36 MG/M2 on days 1, 2, 8, 9, 15 and 16 every 4 weeks, pulmonary S2 are milligrams by mouth daily for 21 days every 4 weeks and Decadron 40 mg by mouth weekly. First cycle started on 04/10/2014.   2) Zometa every 8 weeks.    INTERVAL HISTORY: Eddie Thomas 69 y.o. male returns to the clinic today for followup visit. He is currently on treatment with Carfilzomib, Cytoxan and dexamethasone status post 19 cycles and tolerating his treatment fairly well. He has no complaints today except for fatigue secondary to chemotherapy-induced anemia. He denied having any significant  weight loss or night sweats. He has no fever or chills. He has no nausea or vomiting. The patient denied having any significant chest pain, shortness of breath, cough or hemoptysis. She had repeat myeloma panel performed recently and the patient is here today for evaluation and discussion of his lab results and recommendation regarding treatment of his condition.  MEDICAL HISTORY: Past Medical History  Diagnosis Date  . Multiple myeloma   . Diabetes mellitus 07/03/2011  . Hypertension 07/03/2011  . Hyperlipidemia 07/03/2011  . Colon polyps 2012  . Gastric ulcer     ALLERGIES:  has No Known Allergies.  MEDICATIONS:  Current Outpatient Prescriptions  Medication Sig Dispense Refill  . Cholecalciferol (VITAMIN D-3) 5000 UNITS TABS Take 1 tablet by mouth daily.    [name] lidocaine-prilocaine (EMLA) cream APPLY CREAM TOPICALLY TO AFFECTED AREA AS DIRECTED 30 g 1  . lisinopril (PRINIVIL,ZESTRIL) 10 MG tablet Take 5 mg by mouth every evening.     . pantoprazole (PROTONIX) 40 MG tablet Take 1 tablet (40 mg total) by mouth 2 (two) times daily. 60 tablet 11  . pioglitazone (ACTOS) 15 MG tablet Take 15 mg by mouth daily.    . prochlorperazine (COMPAZINE) 10 MG tablet Take 10 mg by mouth every 6 (six) hours as needed for nausea or vomiting.    . simvastatin (ZOCOR) 10 MG tablet Take 10 mg by mouth at bedtime.      . valACYclovir (VALTREX) 500 MG tablet TAKE ONE TABLET BY MOUTH ONCE DAILY 30 tablet  3  . oxyCODONE-acetaminophen (PERCOCET/ROXICET) 5-325 MG per tablet Take 1 tablet by mouth every 6 (six) hours as needed for severe pain. (Patient not taking: Reported on 03/07/2014) 30 tablet 0  . temazepam (RESTORIL) 15 MG capsule Take 15 mg by mouth at bedtime as needed for sleep.    [name] VIAGRA 50 MG tablet Take 50 mg by mouth daily as needed for erectile dysfunction.      No current facility-administered medications for this visit.    SURGICAL HISTORY:  Past Surgical History  Procedure Laterality Date  .  Limbal stem cell transplant    . Humerus fracture surgery      right  . 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    REVIEW OF SYSTEMS:  Constitutional: positive for fatigue Eyes: negative Ears, nose, mouth, throat, and face: negative Respiratory: negative Cardiovascular: negative Gastrointestinal: negative Genitourinary:negative Integument/breast: negative Hematologic/lymphatic: negative Musculoskeletal:negative Neurological: negative Behavioral/Psych: negative Endocrine: negative Allergic/Immunologic: negative   PHYSICAL EXAMINATION: General appearance: alert, cooperative and no distress Head: Normocephalic, without obvious abnormality, atraumatic Neck: no adenopathy, no JVD, supple, symmetrical, trachea midline and thyroid not enlarged, symmetric, no tenderness/mass/nodules Lymph nodes: Cervical, supraclavicular, and axillary nodes normal. Resp: clear to auscultation bilaterally Back: symmetric, no curvature. ROM normal. No CVA tenderness. Cardio: regular rate and rhythm, S1, S2 normal, no murmur, click, rub or gallop GI: soft, non-tender; bowel sounds normal; no masses,  no organomegaly Extremities: extremities normal, atraumatic, no cyanosis or edema Neurologic: Alert and oriented X 3, normal strength and tone. Normal symmetric reflexes. Normal coordination and gait  ECOG PERFORMANCE STATUS: 1 - Symptomatic but completely ambulatory  Blood pressure 138/70, pulse 71, temperature 97.8 F (36.6 C), temperature source Oral, resp. rate 18, height 5' 11"  (1.803 m), weight 178 lb 3.2 oz (80.831 kg).  LABORATORY DATA: Lab Results  Component Value Date   WBC 2.6* 04/04/2014   HGB 8.4* 04/04/2014   HCT 26.5* 04/04/2014   MCV 77.7* 04/04/2014   PLT 116 Large & giant platelets* 04/04/2014      Chemistry      Component Value Date/Time   NA 141 04/04/2014 0858   NA 139 08/30/2013 0935   K 3.8 04/04/2014 0858   K 4.1 08/30/2013 0935   CL 101 08/30/2013  0935   CL 103 07/12/2012 0907   CO2 25 04/04/2014 0858   CO2 26 08/30/2013 0935   BUN 8.4 04/04/2014 0858   BUN 11 08/30/2013 0935   CREATININE 1.0 04/04/2014 0858   CREATININE 1.03 08/30/2013 0935      Component Value Date/Time   CALCIUM 8.8 03/28/2014 0909   CALCIUM 9.2 08/30/2013 0935   ALKPHOS 35* 03/28/2014 0909   ALKPHOS 39 08/30/2013 0935   AST 17 03/28/2014 0909   AST 13 08/30/2013 0935   ALT 10 03/28/2014 0909   ALT 9 08/30/2013 0935   BILITOT 1.09 03/28/2014 0909   BILITOT 1.3* 08/30/2013 0935     Other lab results:  Beta-2 microglobulin 3.79, free Light chain 0.03, free lambda light chain 15.20 with a kappa/lambda ratio 0.00. IgG 114, IgA 1880 and IgM less than 5.  RADIOGRAPHIC STUDIES: No results found.  ASSESSMENT AND PLAN: This is a very pleasant 69 years old [place] American male with history of multiple myeloma currently undergoing systemic chemotherapy with Carfilzomib, cyclophosphamide and Decadron status post 19 cycles.  The patient tolerated the previous course of treatment fairly well with no significant adverse effects except for fatigue. Unfortunately the myeloma  panel showed evidence for disease progression. I discussed the lab result with the patient and recommended for him to discontinue the current treatment with Carfilzomib, Cytoxan and Decadron. I gave the patient several options for management of his condition including treatment with systemic chemotherapy with Carfilzomib, Pomalyst and Decadron. I discussed with the patient adverse effect of this treatment including the teratogenic effect of Pomalyst. The patient would like to proceed with the treatment as planned. He is expected to start the first cycle of this treatment next week. I will see the patient back for follow-up visit in 3 weeks for reevaluation and management of any adverse effect of this treatment. He'll continue on Zometa every 2 months. He was advised to call immediately if he has any  concerning symptoms in the interval.  The patient voices understanding of current disease status and treatment options and is in agreement with the current care plan. All questions were answered. The patient knows to call the clinic with any problems, questions or concerns. We can certainly see the patient much sooner if necessary.  Disclaimer: This note was dictated with voice recognition software. Similar sounding words can inadvertently be transcribed and may not be corrected upon review.

## 2014-04-05 ENCOUNTER — Ambulatory Visit: Payer: Medicare Other

## 2014-04-06 ENCOUNTER — Telehealth: Payer: Self-pay | Admitting: *Deleted

## 2014-04-06 NOTE — Telephone Encounter (Signed)
Patient called and said that [facility] had contacted him. They said they had been trying to get a prior autorization from our office for his pomalyst.   Let him know I would look into this.  Nothing is documented in his chart that I could find.  Called Carmelina Noun in managed care.  Racquel is doing these and she will ask her and then contact the patient.

## 2014-04-07 ENCOUNTER — Encounter: Payer: Self-pay | Admitting: Internal Medicine

## 2014-04-07 NOTE — Progress Notes (Signed)
I called and spoke wth patient to advise pomyst approved thru 04/07/15. He said they advised was shipping for him tonight

## 2014-04-07 NOTE — Progress Notes (Signed)
I faxed prior auth req for pomalyst 4mg  to [facility]

## 2014-04-07 NOTE — Progress Notes (Signed)
[employer] approved Pomalyst 02/06/14-04/07/15. I called - Gilda to advise/ [facility] know [phone]    [id number]

## 2014-04-07 NOTE — Progress Notes (Signed)
I faxed more answers to questions for pre-auth for pomylst

## 2014-04-10 ENCOUNTER — Telehealth: Payer: Self-pay | Admitting: Internal Medicine

## 2014-04-10 ENCOUNTER — Other Ambulatory Visit: Payer: Self-pay | Admitting: Medical Oncology

## 2014-04-10 ENCOUNTER — Ambulatory Visit (HOSPITAL_BASED_OUTPATIENT_CLINIC_OR_DEPARTMENT_OTHER): Payer: Medicare Other

## 2014-04-10 ENCOUNTER — Ambulatory Visit: Payer: Medicare Other

## 2014-04-10 ENCOUNTER — Other Ambulatory Visit (HOSPITAL_BASED_OUTPATIENT_CLINIC_OR_DEPARTMENT_OTHER): Payer: Medicare Other

## 2014-04-10 DIAGNOSIS — Z5112 Encounter for antineoplastic immunotherapy: Secondary | ICD-10-CM | POA: Diagnosis not present

## 2014-04-10 DIAGNOSIS — C9000 Multiple myeloma not having achieved remission: Secondary | ICD-10-CM

## 2014-04-10 LAB — CBC WITH DIFFERENTIAL/PLATELET
BASO%: 0.3 % (ref 0.0–2.0)
BASOS ABS: 0 10*3/uL (ref 0.0–0.1)
EOS%: 0.4 % (ref 0.0–7.0)
Eosinophils Absolute: 0 10*3/uL (ref 0.0–0.5)
HCT: 31 % — ABNORMAL LOW (ref 38.4–49.9)
HEMOGLOBIN: 9.4 g/dL — AB (ref 13.0–17.1)
LYMPH%: 26.3 % (ref 14.0–49.0)
MCH: 24.2 pg — AB (ref 27.2–33.4)
MCHC: 30.4 g/dL — AB (ref 32.0–36.0)
MCV: 79.5 fL (ref 79.3–98.0)
MONO#: 0.4 10*3/uL (ref 0.1–0.9)
MONO%: 11.6 % (ref 0.0–14.0)
NEUT#: 2 10*3/uL (ref 1.5–6.5)
NEUT%: 61.4 % (ref 39.0–75.0)
Platelets: 163 10*3/uL (ref 140–400)
RBC: 3.89 10*6/uL — ABNORMAL LOW (ref 4.20–5.82)
RDW: 16.5 % — AB (ref 11.0–14.6)
WBC: 3.3 10*3/uL — ABNORMAL LOW (ref 4.0–10.3)
lymph#: 0.9 10*3/uL (ref 0.9–3.3)

## 2014-04-10 LAB — COMPREHENSIVE METABOLIC PANEL (CC13)
ALT: 16 U/L (ref 0–55)
AST: 22 U/L (ref 5–34)
Albumin: 3.7 g/dL (ref 3.5–5.0)
Alkaline Phosphatase: 38 U/L — ABNORMAL LOW (ref 40–150)
Anion Gap: 12 mEq/L — ABNORMAL HIGH (ref 3–11)
BUN: 15.5 mg/dL (ref 7.0–26.0)
CALCIUM: 9.6 mg/dL (ref 8.4–10.4)
CHLORIDE: 103 meq/L (ref 98–109)
CO2: 24 mEq/L (ref 22–29)
Creatinine: 1 mg/dL (ref 0.7–1.3)
EGFR: 86 mL/min/{1.73_m2} — AB (ref >90)
Glucose: 123 mg/dl (ref 70–140)
Potassium: 4.1 mEq/L (ref 3.5–5.1)
Sodium: 139 mEq/L (ref 136–145)
Total Bilirubin: 1.27 mg/dL — ABNORMAL HIGH (ref 0.20–1.20)
Total Protein: 8.1 g/dL (ref 6.4–8.3)

## 2014-04-10 MED ORDER — SODIUM CHLORIDE 0.9 % IV SOLN
Freq: Once | INTRAVENOUS | Status: AC
  Administered 2014-04-10: 14:00:00 via INTRAVENOUS

## 2014-04-10 MED ORDER — SODIUM CHLORIDE 0.9 % IJ SOLN
10.0000 mL | INTRAMUSCULAR | Status: DC | PRN
  Administered 2014-04-10: 10 mL
  Filled 2014-04-10: qty 10

## 2014-04-10 MED ORDER — DEXTROSE 5 % IV SOLN
36.0000 mg/m2 | Freq: Once | INTRAVENOUS | Status: AC
  Administered 2014-04-10: 72 mg via INTRAVENOUS
  Filled 2014-04-10: qty 36

## 2014-04-10 MED ORDER — HEPARIN SOD (PORK) LOCK FLUSH 100 UNIT/ML IV SOLN
500.0000 [IU] | Freq: Once | INTRAVENOUS | Status: AC | PRN
  Administered 2014-04-10: 500 [IU]
  Filled 2014-04-10: qty 5

## 2014-04-10 MED ORDER — SODIUM CHLORIDE 0.9 % IV SOLN
Freq: Once | INTRAVENOUS | Status: AC
  Administered 2014-04-10: 14:00:00 via INTRAVENOUS
  Filled 2014-04-10: qty 4

## 2014-04-10 NOTE — Telephone Encounter (Signed)
Patient stopped by scheduling to have 4/4 and 4/5 appointments rearranged. Lab/fu on 4/5 should be 4/4 with 1st day of chemo. Appointments adjusted and patient given new schedule.

## 2014-04-10 NOTE — Patient Instructions (Signed)
[facility] Discharge Instructions for Patients Receiving Chemotherapy  Today you received the following chemotherapy agents: Kyprolis. To help prevent nausea and vomiting after your treatment, we encourage you to take your nausea medication:  Compazine 10 mg every 6 hours as needed.   If you develop nausea and vomiting that is not controlled by your nausea medication, call the clinic.   BELOW ARE SYMPTOMS THAT SHOULD BE REPORTED IMMEDIATELY:  *FEVER GREATER THAN 100.5 F  *CHILLS WITH OR WITHOUT FEVER  NAUSEA AND VOMITING THAT IS NOT CONTROLLED WITH YOUR NAUSEA MEDICATION  *UNUSUAL SHORTNESS OF BREATH  *UNUSUAL BRUISING OR BLEEDING  TENDERNESS IN MOUTH AND THROAT WITH OR WITHOUT PRESENCE OF ULCERS  *URINARY PROBLEMS  *BOWEL PROBLEMS  UNUSUAL RASH Items with * indicate a potential emergency and should be followed up as soon as possible.  Feel free to call the clinic you have any questions or concerns. The clinic phone number is [phone].  Please show the CHEMO ALERT CARD at check-in to the Emergency Department and triage nurse.   

## 2014-04-11 ENCOUNTER — Encounter: Payer: Self-pay | Admitting: Internal Medicine

## 2014-04-11 ENCOUNTER — Ambulatory Visit: Payer: Medicare Other

## 2014-04-11 ENCOUNTER — Other Ambulatory Visit: Payer: Medicare Other

## 2014-04-11 ENCOUNTER — Ambulatory Visit (HOSPITAL_BASED_OUTPATIENT_CLINIC_OR_DEPARTMENT_OTHER): Payer: Medicare Other

## 2014-04-11 DIAGNOSIS — C9000 Multiple myeloma not having achieved remission: Secondary | ICD-10-CM | POA: Diagnosis not present

## 2014-04-11 DIAGNOSIS — Z5112 Encounter for antineoplastic immunotherapy: Secondary | ICD-10-CM | POA: Diagnosis not present

## 2014-04-11 MED ORDER — SODIUM CHLORIDE 0.9 % IV SOLN
Freq: Once | INTRAVENOUS | Status: AC
  Administered 2014-04-11: 09:00:00 via INTRAVENOUS

## 2014-04-11 MED ORDER — SODIUM CHLORIDE 0.9 % IJ SOLN
10.0000 mL | INTRAMUSCULAR | Status: DC | PRN
  Administered 2014-04-11: 10 mL
  Filled 2014-04-11: qty 10

## 2014-04-11 MED ORDER — DEXTROSE 5 % IV SOLN
36.0000 mg/m2 | Freq: Once | INTRAVENOUS | Status: AC
  Administered 2014-04-11: 72 mg via INTRAVENOUS
  Filled 2014-04-11: qty 36

## 2014-04-11 MED ORDER — HEPARIN SOD (PORK) LOCK FLUSH 100 UNIT/ML IV SOLN
500.0000 [IU] | Freq: Once | INTRAVENOUS | Status: AC | PRN
Start: 2014-04-11 — End: 2014-04-11
  Administered 2014-04-11: 500 [IU]
  Filled 2014-04-11: qty 5

## 2014-04-11 MED ORDER — DEXAMETHASONE SODIUM PHOSPHATE 100 MG/10ML IJ SOLN
Freq: Once | INTRAMUSCULAR | Status: AC
  Administered 2014-04-11: 09:00:00 via INTRAVENOUS
  Filled 2014-04-11: qty 4

## 2014-04-11 NOTE — Progress Notes (Signed)
Patient states that he had 2 loose stools in last 24 hours. Pt instructed to call MD if greater than 4 loose stools diarrhea  in 24 hours.

## 2014-04-11 NOTE — Patient Instructions (Signed)
[facility] Discharge Instructions for Patients Receiving Chemotherapy  Today you received the following chemotherapy agents Kryprolis.  To help prevent nausea and vomiting after your treatment, we encourage you to take your nausea medication as prescribed by your physician.   If you develop nausea and vomiting that is not controlled by your nausea medication, call the clinic.   BELOW ARE SYMPTOMS THAT SHOULD BE REPORTED IMMEDIATELY:  *FEVER GREATER THAN 100.5 F  *CHILLS WITH OR WITHOUT FEVER  NAUSEA AND VOMITING THAT IS NOT CONTROLLED WITH YOUR NAUSEA MEDICATION  *UNUSUAL SHORTNESS OF BREATH  *UNUSUAL BRUISING OR BLEEDING  TENDERNESS IN MOUTH AND THROAT WITH OR WITHOUT PRESENCE OF ULCERS  *URINARY PROBLEMS  *BOWEL PROBLEMS. If you have more than four episodes of loose stools, call the doctor.  UNUSUAL RASH Items with * indicate a potential emergency and should be followed up as soon as possible.  Feel free to call the clinic you have any questions or concerns. The clinic phone number is [phone].  Please show the [facility] at check-in to the Emergency Department and triage nurse.

## 2014-04-11 NOTE — Progress Notes (Signed)
Note from [facility] recd. Pomalyst approval 02/06/14-04/07/15. Ref# [id number]. I will send to medical records.

## 2014-04-12 ENCOUNTER — Ambulatory Visit: Payer: Medicare Other

## 2014-04-17 ENCOUNTER — Ambulatory Visit (HOSPITAL_BASED_OUTPATIENT_CLINIC_OR_DEPARTMENT_OTHER): Payer: Medicare Other

## 2014-04-17 ENCOUNTER — Other Ambulatory Visit (HOSPITAL_BASED_OUTPATIENT_CLINIC_OR_DEPARTMENT_OTHER): Payer: Medicare Other

## 2014-04-17 DIAGNOSIS — Z5112 Encounter for antineoplastic immunotherapy: Secondary | ICD-10-CM | POA: Diagnosis not present

## 2014-04-17 DIAGNOSIS — C9000 Multiple myeloma not having achieved remission: Secondary | ICD-10-CM | POA: Diagnosis not present

## 2014-04-17 LAB — COMPREHENSIVE METABOLIC PANEL (CC13)
ALK PHOS: 36 U/L — AB (ref 40–150)
ALT: 15 U/L (ref 0–55)
AST: 14 U/L (ref 5–34)
Albumin: 3.3 g/dL — ABNORMAL LOW (ref 3.5–5.0)
Anion Gap: 11 mEq/L (ref 3–11)
BILIRUBIN TOTAL: 1.21 mg/dL — AB (ref 0.20–1.20)
BUN: 14.1 mg/dL (ref 7.0–26.0)
CO2: 24 mEq/L (ref 22–29)
Calcium: 8.9 mg/dL (ref 8.4–10.4)
Chloride: 105 mEq/L (ref 98–109)
Creatinine: 0.9 mg/dL (ref 0.7–1.3)
EGFR: >90 mL/min/{1.73_m2} (ref >90)
Glucose: 117 mg/dl (ref 70–140)
Potassium: 4.1 mEq/L (ref 3.5–5.1)
SODIUM: 140 meq/L (ref 136–145)
TOTAL PROTEIN: 6.5 g/dL (ref 6.4–8.3)

## 2014-04-17 LAB — CBC WITH DIFFERENTIAL/PLATELET
BASO%: 0 % (ref 0.0–2.0)
BASOS ABS: 0 10*3/uL (ref 0.0–0.1)
EOS%: 1.1 % (ref 0.0–7.0)
Eosinophils Absolute: 0 10*3/uL (ref 0.0–0.5)
HCT: 28.2 % — ABNORMAL LOW (ref 38.4–49.9)
HEMOGLOBIN: 8.9 g/dL — AB (ref 13.0–17.1)
LYMPH%: 22 % (ref 14.0–49.0)
MCH: 24.7 pg — AB (ref 27.2–33.4)
MCHC: 31.6 g/dL — AB (ref 32.0–36.0)
MCV: 78.3 fL — AB (ref 79.3–98.0)
MONO#: 0.3 10*3/uL (ref 0.1–0.9)
MONO%: 11 % (ref 0.0–14.0)
NEUT#: 1.7 10*3/uL (ref 1.5–6.5)
NEUT%: 65.9 % (ref 39.0–75.0)
Platelets: 61 10*3/uL — ABNORMAL LOW (ref 140–400)
RBC: 3.6 10*6/uL — ABNORMAL LOW (ref 4.20–5.82)
RDW: 15.5 % — AB (ref 11.0–14.6)
WBC: 2.6 10*3/uL — ABNORMAL LOW (ref 4.0–10.3)
lymph#: 0.6 10*3/uL — ABNORMAL LOW (ref 0.9–3.3)
nRBC: 1 % — ABNORMAL HIGH (ref 0–0)

## 2014-04-17 MED ORDER — SODIUM CHLORIDE 0.9 % IJ SOLN
10.0000 mL | INTRAMUSCULAR | Status: DC | PRN
  Administered 2014-04-17: 10 mL
  Filled 2014-04-17: qty 10

## 2014-04-17 MED ORDER — SODIUM CHLORIDE 0.9 % IV SOLN
Freq: Once | INTRAVENOUS | Status: AC
  Administered 2014-04-17: 09:00:00 via INTRAVENOUS
  Filled 2014-04-17: qty 4

## 2014-04-17 MED ORDER — SODIUM CHLORIDE 0.9 % IV SOLN
Freq: Once | INTRAVENOUS | Status: AC
  Administered 2014-04-17: 10:00:00 via INTRAVENOUS

## 2014-04-17 MED ORDER — SODIUM CHLORIDE 0.9 % IV SOLN
Freq: Once | INTRAVENOUS | Status: AC
  Administered 2014-04-17: 09:00:00 via INTRAVENOUS

## 2014-04-17 MED ORDER — HEPARIN SOD (PORK) LOCK FLUSH 100 UNIT/ML IV SOLN
500.0000 [IU] | Freq: Once | INTRAVENOUS | Status: AC | PRN
  Administered 2014-04-17: 500 [IU]
  Filled 2014-04-17: qty 5

## 2014-04-17 MED ORDER — DEXTROSE 5 % IV SOLN
36.0000 mg/m2 | Freq: Once | INTRAVENOUS | Status: AC
  Administered 2014-04-17: 72 mg via INTRAVENOUS
  Filled 2014-04-17: qty 36

## 2014-04-17 NOTE — Patient Instructions (Signed)
[facility] Discharge Instructions for Patients Receiving Chemotherapy  Today you received the following chemotherapy agents Kyprolis.  To help prevent nausea and vomiting after your treatment, we encourage you to take your nausea medication as prescribed.   If you develop nausea and vomiting that is not controlled by your nausea medication, call the clinic.   BELOW ARE SYMPTOMS THAT SHOULD BE REPORTED IMMEDIATELY:  *FEVER GREATER THAN 100.5 F  *CHILLS WITH OR WITHOUT FEVER  NAUSEA AND VOMITING THAT IS NOT CONTROLLED WITH YOUR NAUSEA MEDICATION  *UNUSUAL SHORTNESS OF BREATH  *UNUSUAL BRUISING OR BLEEDING  TENDERNESS IN MOUTH AND THROAT WITH OR WITHOUT PRESENCE OF ULCERS  *URINARY PROBLEMS  *BOWEL PROBLEMS  UNUSUAL RASH Items with * indicate a potential emergency and should be followed up as soon as possible.  Feel free to call the clinic you have any questions or concerns. The clinic phone number is [phone].  Please show the CHEMO ALERT CARD at check-in to the Emergency Department and triage nurse.   

## 2014-04-17 NOTE — Progress Notes (Signed)
Per Dr.Mohamed, proceed with treatment as scheduled. MD aware of platelets of 61.

## 2014-04-18 ENCOUNTER — Other Ambulatory Visit: Payer: Medicare Other

## 2014-04-18 ENCOUNTER — Ambulatory Visit (HOSPITAL_BASED_OUTPATIENT_CLINIC_OR_DEPARTMENT_OTHER): Payer: Medicare Other

## 2014-04-18 ENCOUNTER — Ambulatory Visit: Payer: Medicare Other

## 2014-04-18 DIAGNOSIS — Z5112 Encounter for antineoplastic immunotherapy: Secondary | ICD-10-CM | POA: Diagnosis not present

## 2014-04-18 DIAGNOSIS — C9000 Multiple myeloma not having achieved remission: Secondary | ICD-10-CM

## 2014-04-18 MED ORDER — HEPARIN SOD (PORK) LOCK FLUSH 100 UNIT/ML IV SOLN
500.0000 [IU] | Freq: Once | INTRAVENOUS | Status: AC | PRN
Start: 2014-04-18 — End: 2014-04-18
  Administered 2014-04-18: 500 [IU]
  Filled 2014-04-18: qty 5

## 2014-04-18 MED ORDER — SODIUM CHLORIDE 0.9 % IV SOLN: Freq: Once | INTRAVENOUS | Status: DC

## 2014-04-18 MED ORDER — SODIUM CHLORIDE 0.9 % IJ SOLN
10.0000 mL | INTRAMUSCULAR | Status: DC | PRN
  Administered 2014-04-18: 10 mL
  Filled 2014-04-18: qty 10

## 2014-04-18 MED ORDER — DEXTROSE 5 % IV SOLN
36.0000 mg/m2 | Freq: Once | INTRAVENOUS | Status: AC
  Administered 2014-04-18: 72 mg via INTRAVENOUS
  Filled 2014-04-18: qty 36

## 2014-04-18 MED ORDER — SODIUM CHLORIDE 0.9 % IV SOLN
Freq: Once | INTRAVENOUS | Status: AC
  Administered 2014-04-18: 08:00:00 via INTRAVENOUS

## 2014-04-18 MED ORDER — SODIUM CHLORIDE 0.9 % IV SOLN
Freq: Once | INTRAVENOUS | Status: AC
  Administered 2014-04-18: 08:00:00 via INTRAVENOUS
  Filled 2014-04-18: qty 4

## 2014-04-18 NOTE — Patient Instructions (Signed)
[facility] Discharge Instructions for Patients Receiving Chemotherapy  Today you received the following chemotherapy agent: Kyprolis.  To help prevent nausea and vomiting after your treatment, we encourage you to take your nausea medication as prescribed.   If you develop nausea and vomiting that is not controlled by your nausea medication, call the clinic.   BELOW ARE SYMPTOMS THAT SHOULD BE REPORTED IMMEDIATELY:  *FEVER GREATER THAN 100.5 F  *CHILLS WITH OR WITHOUT FEVER  NAUSEA AND VOMITING THAT IS NOT CONTROLLED WITH YOUR NAUSEA MEDICATION  *UNUSUAL SHORTNESS OF BREATH  *UNUSUAL BRUISING OR BLEEDING  TENDERNESS IN MOUTH AND THROAT WITH OR WITHOUT PRESENCE OF ULCERS  *URINARY PROBLEMS  *BOWEL PROBLEMS  UNUSUAL RASH Items with * indicate a potential emergency and should be followed up as soon as possible.  Feel free to call the clinic you have any questions or concerns. The clinic phone number is [phone].  Please show the CHEMO ALERT CARD at check-in to the Emergency Department and triage nurse.   

## 2014-04-18 NOTE — Progress Notes (Signed)
Patient reports he is not sleeping well. RN asked if this occurs just during treatments with steroids, patient states "it is all the time." He has been prescribed restoril in the past but he informs me "it doesn't work. When asked if he is interested in trying other medication to help him sleep, pt states "i'll think about it." He knows to let a provider know when he reaches a decision and wants to proceed.

## 2014-04-19 ENCOUNTER — Ambulatory Visit: Payer: Medicare Other

## 2014-04-21 ENCOUNTER — Other Ambulatory Visit: Payer: Self-pay | Admitting: Medical Oncology

## 2014-04-21 ENCOUNTER — Ambulatory Visit: Payer: Medicare Other | Admitting: Nurse Practitioner

## 2014-04-21 DIAGNOSIS — C9000 Multiple myeloma not having achieved remission: Secondary | ICD-10-CM

## 2014-04-21 MED ORDER — POMALIDOMIDE 4 MG PO CAPS: 4.0000 mg | ORAL_CAPSULE | Freq: Every day | ORAL | Status: DC

## 2014-04-21 NOTE — Progress Notes (Signed)
Pomalyst refill sent.

## 2014-04-24 ENCOUNTER — Ambulatory Visit (HOSPITAL_BASED_OUTPATIENT_CLINIC_OR_DEPARTMENT_OTHER): Payer: Medicare Other | Admitting: Internal Medicine

## 2014-04-24 ENCOUNTER — Other Ambulatory Visit (HOSPITAL_BASED_OUTPATIENT_CLINIC_OR_DEPARTMENT_OTHER): Payer: Medicare Other

## 2014-04-24 ENCOUNTER — Encounter: Payer: Self-pay | Admitting: Internal Medicine

## 2014-04-24 ENCOUNTER — Ambulatory Visit (HOSPITAL_BASED_OUTPATIENT_CLINIC_OR_DEPARTMENT_OTHER): Payer: Medicare Other

## 2014-04-24 ENCOUNTER — Telehealth: Payer: Self-pay | Admitting: Internal Medicine

## 2014-04-24 VITALS — BP 151/67 | HR 75 | Temp 98.4°F | Resp 20 | Ht 71.0 in | Wt 175.0 lb

## 2014-04-24 DIAGNOSIS — C9000 Multiple myeloma not having achieved remission: Secondary | ICD-10-CM

## 2014-04-24 DIAGNOSIS — Z5112 Encounter for antineoplastic immunotherapy: Secondary | ICD-10-CM

## 2014-04-24 LAB — COMPREHENSIVE METABOLIC PANEL (CC13)
ALK PHOS: 46 U/L (ref 40–150)
ALT: 13 U/L (ref 0–55)
AST: 11 U/L (ref 5–34)
Albumin: 3.4 g/dL — ABNORMAL LOW (ref 3.5–5.0)
Anion Gap: 12 mEq/L — ABNORMAL HIGH (ref 3–11)
BILIRUBIN TOTAL: 1.56 mg/dL — AB (ref 0.20–1.20)
BUN: 11.2 mg/dL (ref 7.0–26.0)
CO2: 22 mEq/L (ref 22–29)
CREATININE: 0.9 mg/dL (ref 0.7–1.3)
Calcium: 8.7 mg/dL (ref 8.4–10.4)
Chloride: 104 mEq/L (ref 98–109)
EGFR: >90 mL/min/{1.73_m2} (ref >90)
Glucose: 196 mg/dl — ABNORMAL HIGH (ref 70–140)
Potassium: 4.7 mEq/L (ref 3.5–5.1)
Sodium: 137 mEq/L (ref 136–145)
Total Protein: 6.3 g/dL — ABNORMAL LOW (ref 6.4–8.3)

## 2014-04-24 LAB — CBC WITH DIFFERENTIAL/PLATELET
BASO%: 0 % (ref 0.0–2.0)
Basophils Absolute: 0 10*3/uL (ref 0.0–0.1)
EOS ABS: 0 10*3/uL (ref 0.0–0.5)
EOS%: 0.3 % (ref 0.0–7.0)
HEMATOCRIT: 27.3 % — AB (ref 38.4–49.9)
HGB: 8.8 g/dL — ABNORMAL LOW (ref 13.0–17.1)
LYMPH#: 0.3 10*3/uL — AB (ref 0.9–3.3)
LYMPH%: 9 % — AB (ref 14.0–49.0)
MCH: 24.8 pg — ABNORMAL LOW (ref 27.2–33.4)
MCHC: 32.2 g/dL (ref 32.0–36.0)
MCV: 76.9 fL — ABNORMAL LOW (ref 79.3–98.0)
MONO#: 0.2 10*3/uL (ref 0.1–0.9)
MONO%: 5.1 % (ref 0.0–14.0)
NEUT%: 85.6 % — ABNORMAL HIGH (ref 39.0–75.0)
NEUTROS ABS: 2.9 10*3/uL (ref 1.5–6.5)
NRBC: 0 % (ref 0–0)
PLATELETS: 58 10*3/uL — AB (ref 140–400)
RBC: 3.55 10*6/uL — ABNORMAL LOW (ref 4.20–5.82)
RDW: 15.2 % — ABNORMAL HIGH (ref 11.0–14.6)
WBC: 3.3 10*3/uL — ABNORMAL LOW (ref 4.0–10.3)

## 2014-04-24 MED ORDER — DEXTROSE 5 % IV SOLN
36.0000 mg/m2 | Freq: Once | INTRAVENOUS | Status: AC
  Administered 2014-04-24: 72 mg via INTRAVENOUS
  Filled 2014-04-24: qty 36

## 2014-04-24 MED ORDER — SODIUM CHLORIDE 0.9 % IV SOLN
Freq: Once | INTRAVENOUS | Status: AC
  Administered 2014-04-24: 14:00:00 via INTRAVENOUS

## 2014-04-24 MED ORDER — SODIUM CHLORIDE 0.9 % IJ SOLN
10.0000 mL | INTRAMUSCULAR | Status: DC | PRN
  Administered 2014-04-24: 10 mL
  Filled 2014-04-24: qty 10

## 2014-04-24 MED ORDER — HEPARIN SOD (PORK) LOCK FLUSH 100 UNIT/ML IV SOLN
500.0000 [IU] | Freq: Once | INTRAVENOUS | Status: AC | PRN
  Administered 2014-04-24: 500 [IU]
  Filled 2014-04-24: qty 5

## 2014-04-24 MED ORDER — SODIUM CHLORIDE 0.9 % IV SOLN
Freq: Once | INTRAVENOUS | Status: AC
  Administered 2014-04-24: 15:00:00 via INTRAVENOUS
  Filled 2014-04-24: qty 4

## 2014-04-24 NOTE — Telephone Encounter (Signed)
gave and printed appt sched and avs fo rpt for April adn May  °

## 2014-04-24 NOTE — Patient Instructions (Signed)
[facility] Discharge Instructions for Patients Receiving Chemotherapy  Today you received the following chemotherapy agents Kyprolis.  To help prevent nausea and vomiting after your treatment, we encourage you to take your nausea medication as prescribed.   If you develop nausea and vomiting that is not controlled by your nausea medication, call the clinic.   BELOW ARE SYMPTOMS THAT SHOULD BE REPORTED IMMEDIATELY:  *FEVER GREATER THAN 100.5 F  *CHILLS WITH OR WITHOUT FEVER  NAUSEA AND VOMITING THAT IS NOT CONTROLLED WITH YOUR NAUSEA MEDICATION  *UNUSUAL SHORTNESS OF BREATH  *UNUSUAL BRUISING OR BLEEDING  TENDERNESS IN MOUTH AND THROAT WITH OR WITHOUT PRESENCE OF ULCERS  *URINARY PROBLEMS  *BOWEL PROBLEMS  UNUSUAL RASH Items with * indicate a potential emergency and should be followed up as soon as possible.  Feel free to call the clinic you have any questions or concerns. The clinic phone number is [phone].  Please show the CHEMO ALERT CARD at check-in to the Emergency Department and triage nurse.   

## 2014-04-24 NOTE — Progress Notes (Signed)
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   PRIOR THERAPY: :  1. Status post 6 cycles of systemic chemotherapy with Revlimid and Decadron, last dose was given 07/21/2010 with very good response. 2. 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 [name].  3. maintenance Revlimid at 10 mg by mouth daily status post 2 months. Therapy began 01/18/2011. 4. maintenance Revlimid at 15 mg by mouth daily with prophylactic dose Coumadin at 2 mg by mouth daily. 5. Systemic chemotherapy with Velcade at 1.3 mg per meter squared given on days 1, 4, 8 and 11 and Doxil at 30 mg per meter square given on day 4 and Decadron 40 mg by mouth on weekly basis given every 3 weeks. Status post 4 cycles. 6. Zometa 4 mg IV every 4 weeks.  7. Velcade 1.3 mg/M2 subcutaneous daily on a weekly basis with Decadron 20 mg by mouth on a weekly basis. First cycle expected on 06/21/2012. S/P 4 cycles. 8. Systemic chemotherapy with Carfilzomib, cyclophosphamide and Decadron. First cycle started on 08/09/2012. He is status post 19 cycles.   CURRENT THERAPY:  1) Systemic chemotherapy with Carfilzomib 36 MG/M2 on days 1, 2, 8, 9, 15 and 16 every 4 weeks, Pomalyst 4 mg by mouth daily for 21 days every 4 weeks and Decadron 40 mg by mouth weekly. First cycle started on 04/10/2014.   2) Zometa every 8 weeks.    INTERVAL HISTORY: Eddie Thomas 69 y.o. male returns to the clinic today for followup visit. He is currently on treatment with Carfilzomib, Pomalyst and dexamethasone status post 1 cycle and tolerating his treatment fairly well except for thrombocytopenia. He denied having any bleeding issues. He denied having any significant weight loss or night sweats. He has no  fever or chills. He has no nausea or vomiting. The patient denied having any significant chest pain, shortness of breath, cough or hemoptysis. He is here to start day 15 of the first cycle.  MEDICAL HISTORY: Past Medical History  Diagnosis Date  . Multiple myeloma   . Diabetes mellitus 07/03/2011  . Hypertension 07/03/2011  . Hyperlipidemia 07/03/2011  . Colon polyps 2012  . Gastric ulcer     ALLERGIES:  has No Known Allergies.  MEDICATIONS:  Current Outpatient Prescriptions  Medication Sig Dispense Refill  . Cholecalciferol (VITAMIN D-3) 5000 UNITS TABS Take 1 tablet by mouth daily.    [name] lidocaine-prilocaine (EMLA) cream APPLY CREAM TOPICALLY TO AFFECTED AREA AS DIRECTED 30 g 1  . lisinopril (PRINIVIL,ZESTRIL) 10 MG tablet Take 5 mg by mouth every evening.     [name] oxyCODONE-acetaminophen (PERCOCET/ROXICET) 5-325 MG per tablet Take 1 tablet by mouth every 6 (six) hours as needed for severe pain. 30 tablet 0  . pantoprazole (PROTONIX) 40 MG tablet Take 1 tablet (40 mg total) by mouth 2 (two) times daily. 60 tablet 11  . pioglitazone (ACTOS) 15 MG tablet Take 15 mg by mouth daily.    . pomalidomide (POMALYST) 4 MG capsule Take 1 capsule (4 mg total) by mouth daily. Take with water on days 1-21. Repeat every 28 days. 21 capsule 0  . prochlorperazine (COMPAZINE) 10 MG tablet Take 10 mg by mouth every 6 (six) hours as needed for nausea or vomiting.    [name]  simvastatin (ZOCOR) 10 MG tablet Take 10 mg by mouth at bedtime.      . temazepam (RESTORIL) 15 MG capsule Take 15 mg by mouth at bedtime as needed for sleep.    . valACYclovir (VALTREX) 500 MG tablet TAKE ONE TABLET BY MOUTH ONCE DAILY 30 tablet 3  . VIAGRA 50 MG tablet Take 50 mg by mouth daily as needed for erectile dysfunction.      No current facility-administered medications for this visit.    SURGICAL HISTORY:  Past Surgical History  Procedure Laterality Date  . Limbal stem cell transplant    . Humerus fracture surgery      right    . 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    REVIEW OF SYSTEMS:  Constitutional: positive for fatigue Eyes: negative Ears, nose, mouth, throat, and face: negative Respiratory: negative Cardiovascular: negative Gastrointestinal: negative Genitourinary:negative Integument/breast: negative Hematologic/lymphatic: negative Musculoskeletal:negative Neurological: negative Behavioral/Psych: negative Endocrine: negative Allergic/Immunologic: negative   PHYSICAL EXAMINATION: General appearance: alert, cooperative and no distress Head: Normocephalic, without obvious abnormality, atraumatic Neck: no adenopathy, no JVD, supple, symmetrical, trachea midline and thyroid not enlarged, symmetric, no tenderness/mass/nodules Lymph nodes: Cervical, supraclavicular, and axillary nodes normal. Resp: clear to auscultation bilaterally Back: symmetric, no curvature. ROM normal. No CVA tenderness. Cardio: regular rate and rhythm, S1, S2 normal, no murmur, click, rub or gallop GI: soft, non-tender; bowel sounds normal; no masses,  no organomegaly Extremities: extremities normal, atraumatic, no cyanosis or edema Neurologic: Alert and oriented X 3, normal strength and tone. Normal symmetric reflexes. Normal coordination and gait  ECOG PERFORMANCE STATUS: 1 - Symptomatic but completely ambulatory  Blood pressure 151/67, pulse 75, temperature 98.4 F (36.9 C), temperature source Oral, resp. rate 20, height _0  (1.803 m), weight 175 lb (79.379 kg), SpO2 100 %.  LABORATORY DATA: Lab Results  Component Value Date   WBC 3.3* 04/24/2014   HGB 8.8* 04/24/2014   HCT 27.3* 04/24/2014   MCV 76.9* 04/24/2014   PLT 58* 04/24/2014      Chemistry      Component Value Date/Time   NA 140 04/17/2014 0806   NA 139 08/30/2013 0935   K 4.1 04/17/2014 0806   K 4.1 08/30/2013 0935   CL 101 08/30/2013 0935   CL 103 07/12/2012 0907   CO2 24 04/17/2014 0806   CO2 26 08/30/2013 0935   BUN 14.1  04/17/2014 0806   BUN 11 08/30/2013 0935   CREATININE 0.9 04/17/2014 0806   CREATININE 1.03 08/30/2013 0935      Component Value Date/Time   CALCIUM 8.9 04/17/2014 0806   CALCIUM 9.2 08/30/2013 0935   ALKPHOS 36* 04/17/2014 0806   ALKPHOS 39 08/30/2013 0935   AST 14 04/17/2014 0806   AST 13 08/30/2013 0935   ALT 15 04/17/2014 0806   ALT 9 08/30/2013 0935   BILITOT 1.21* 04/17/2014 0806   BILITOT 1.3* 08/30/2013 0935     Other lab results:   RADIOGRAPHIC STUDIES: No results found.  ASSESSMENT AND PLAN: This is a very pleasant 69 years old [place] American male with history of multiple myeloma currently undergoing systemic chemotherapy with Carfilzomib, cyclophosphamide and Decadron status post 19 cycles. This was discontinued secondary to disease progression. The patient has a started on systemic chemotherapy with Carfilzomib, Pomalyst and Decadron status post 2 weeks of the first cycle. He is tolerating his treatment fairly well except for thrombocytopenia that he has no bleeding issues. We will proceed with day  15 and 16 of the first cycle as a scheduled. The patient would come back for follow-up visit in 2 weeks before starting cycle #2 for evaluation. He'll continue on Zometa every 2 months. He was advised to call immediately if he has any concerning symptoms in the interval.  The patient voices understanding of current disease status and treatment options and is in agreement with the current care plan. All questions were answered. The patient knows to call the clinic with any problems, questions or concerns. We can certainly see the patient much sooner if necessary.  Disclaimer: This note was dictated with voice recognition software. Similar sounding words can inadvertently be transcribed and may not be corrected upon review.

## 2014-04-25 ENCOUNTER — Ambulatory Visit: Payer: Medicare Other | Admitting: Internal Medicine

## 2014-04-25 ENCOUNTER — Other Ambulatory Visit: Payer: Medicare Other

## 2014-04-25 ENCOUNTER — Ambulatory Visit (HOSPITAL_BASED_OUTPATIENT_CLINIC_OR_DEPARTMENT_OTHER): Payer: Medicare Other

## 2014-04-25 DIAGNOSIS — Z5112 Encounter for antineoplastic immunotherapy: Secondary | ICD-10-CM

## 2014-04-25 DIAGNOSIS — C9000 Multiple myeloma not having achieved remission: Secondary | ICD-10-CM | POA: Diagnosis not present

## 2014-04-25 MED ORDER — DEXTROSE 5 % IV SOLN
36.0000 mg/m2 | Freq: Once | INTRAVENOUS | Status: AC
  Administered 2014-04-25: 72 mg via INTRAVENOUS
  Filled 2014-04-25: qty 36

## 2014-04-25 MED ORDER — SODIUM CHLORIDE 0.9 % IV SOLN
Freq: Once | INTRAVENOUS | Status: AC
  Administered 2014-04-25: 11:00:00 via INTRAVENOUS

## 2014-04-25 MED ORDER — SODIUM CHLORIDE 0.9 % IV SOLN
Freq: Once | INTRAVENOUS | Status: AC
  Administered 2014-04-25: 11:00:00 via INTRAVENOUS
  Filled 2014-04-25: qty 4

## 2014-04-25 NOTE — Patient Instructions (Signed)
[facility] Discharge Instructions for Patients Receiving Chemotherapy  Today you received the following chemotherapy agents Kyprolis.  To help prevent nausea and vomiting after your treatment, we encourage you to take your nausea medication as prescribed.   If you develop nausea and vomiting that is not controlled by your nausea medication, call the clinic.   BELOW ARE SYMPTOMS THAT SHOULD BE REPORTED IMMEDIATELY:  *FEVER GREATER THAN 100.5 F  *CHILLS WITH OR WITHOUT FEVER  NAUSEA AND VOMITING THAT IS NOT CONTROLLED WITH YOUR NAUSEA MEDICATION  *UNUSUAL SHORTNESS OF BREATH  *UNUSUAL BRUISING OR BLEEDING  TENDERNESS IN MOUTH AND THROAT WITH OR WITHOUT PRESENCE OF ULCERS  *URINARY PROBLEMS  *BOWEL PROBLEMS  UNUSUAL RASH Items with * indicate a potential emergency and should be followed up as soon as possible.  Feel free to call the clinic you have any questions or concerns. The clinic phone number is [phone].  Please show the CHEMO ALERT CARD at check-in to the Emergency Department and triage nurse.   

## 2014-05-02 ENCOUNTER — Other Ambulatory Visit (HOSPITAL_BASED_OUTPATIENT_CLINIC_OR_DEPARTMENT_OTHER): Payer: Medicare Other

## 2014-05-02 DIAGNOSIS — C9000 Multiple myeloma not having achieved remission: Secondary | ICD-10-CM | POA: Diagnosis present

## 2014-05-02 LAB — CBC WITH DIFFERENTIAL/PLATELET
BASO%: 0 % (ref 0.0–2.0)
Basophils Absolute: 0 10*3/uL (ref 0.0–0.1)
EOS ABS: 0 10*3/uL (ref 0.0–0.5)
EOS%: 2.3 % (ref 0.0–7.0)
HCT: 27.2 % — ABNORMAL LOW (ref 38.4–49.9)
HGB: 8.8 g/dL — ABNORMAL LOW (ref 13.0–17.1)
LYMPH%: 37.9 % (ref 14.0–49.0)
MCH: 24.9 pg — ABNORMAL LOW (ref 27.2–33.4)
MCHC: 32.4 g/dL (ref 32.0–36.0)
MCV: 76.8 fL — ABNORMAL LOW (ref 79.3–98.0)
MONO#: 0.4 10*3/uL (ref 0.1–0.9)
MONO%: 20.1 % — AB (ref 0.0–14.0)
NEUT%: 39.7 % (ref 39.0–75.0)
NEUTROS ABS: 0.7 10*3/uL — AB (ref 1.5–6.5)
NRBC: 0 % (ref 0–0)
PLATELETS: 132 10*3/uL — AB (ref 140–400)
RBC: 3.54 10*6/uL — AB (ref 4.20–5.82)
RDW: 15.7 % — AB (ref 11.0–14.6)
WBC: 1.7 10*3/uL — AB (ref 4.0–10.3)
lymph#: 0.7 10*3/uL — ABNORMAL LOW (ref 0.9–3.3)

## 2014-05-02 LAB — COMPREHENSIVE METABOLIC PANEL (CC13)
ALT: 14 U/L (ref 0–55)
AST: 10 U/L (ref 5–34)
Albumin: 3.4 g/dL — ABNORMAL LOW (ref 3.5–5.0)
Alkaline Phosphatase: 54 U/L (ref 40–150)
Anion Gap: 7 mEq/L (ref 3–11)
BILIRUBIN TOTAL: 1.56 mg/dL — AB (ref 0.20–1.20)
BUN: 13.2 mg/dL (ref 7.0–26.0)
CO2: 24 meq/L (ref 22–29)
Calcium: 9 mg/dL (ref 8.4–10.4)
Chloride: 106 mEq/L (ref 98–109)
Creatinine: 0.9 mg/dL (ref 0.7–1.3)
EGFR: >90 mL/min/{1.73_m2} (ref >90)
Glucose: 134 mg/dl (ref 70–140)
Potassium: 4.2 mEq/L (ref 3.5–5.1)
SODIUM: 138 meq/L (ref 136–145)
TOTAL PROTEIN: 6 g/dL — AB (ref 6.4–8.3)

## 2014-05-08 ENCOUNTER — Other Ambulatory Visit (HOSPITAL_BASED_OUTPATIENT_CLINIC_OR_DEPARTMENT_OTHER): Payer: Medicare Other

## 2014-05-08 ENCOUNTER — Ambulatory Visit (HOSPITAL_BASED_OUTPATIENT_CLINIC_OR_DEPARTMENT_OTHER): Payer: Medicare Other | Admitting: Oncology

## 2014-05-08 ENCOUNTER — Telehealth: Payer: Self-pay | Admitting: Internal Medicine

## 2014-05-08 ENCOUNTER — Telehealth: Payer: Self-pay | Admitting: Oncology

## 2014-05-08 ENCOUNTER — Encounter: Payer: Self-pay | Admitting: Oncology

## 2014-05-08 ENCOUNTER — Ambulatory Visit (HOSPITAL_BASED_OUTPATIENT_CLINIC_OR_DEPARTMENT_OTHER): Payer: Medicare Other

## 2014-05-08 VITALS — BP 135/68 | HR 65 | Temp 97.8°F | Resp 18 | Ht 71.0 in | Wt 172.7 lb

## 2014-05-08 DIAGNOSIS — Z5112 Encounter for antineoplastic immunotherapy: Secondary | ICD-10-CM | POA: Diagnosis present

## 2014-05-08 DIAGNOSIS — C9000 Multiple myeloma not having achieved remission: Secondary | ICD-10-CM

## 2014-05-08 LAB — COMPREHENSIVE METABOLIC PANEL (CC13)
ALBUMIN: 3.6 g/dL (ref 3.5–5.0)
ALK PHOS: 60 U/L (ref 40–150)
ALT: 15 U/L (ref 0–55)
AST: 14 U/L (ref 5–34)
Anion Gap: 11 mEq/L (ref 3–11)
BILIRUBIN TOTAL: 1.05 mg/dL (ref 0.20–1.20)
BUN: 12.3 mg/dL (ref 7.0–26.0)
CO2: 22 mEq/L (ref 22–29)
Calcium: 9.1 mg/dL (ref 8.4–10.4)
Chloride: 108 mEq/L (ref 98–109)
Creatinine: 1 mg/dL (ref 0.7–1.3)
EGFR: >90 mL/min/{1.73_m2} (ref >90)
Glucose: 103 mg/dl (ref 70–140)
POTASSIUM: 3.9 meq/L (ref 3.5–5.1)
SODIUM: 141 meq/L (ref 136–145)
TOTAL PROTEIN: 6.1 g/dL — AB (ref 6.4–8.3)

## 2014-05-08 LAB — CBC WITH DIFFERENTIAL/PLATELET
BASO%: 1.6 % (ref 0.0–2.0)
Basophils Absolute: 0.1 10*3/uL (ref 0.0–0.1)
EOS%: 1 % (ref 0.0–7.0)
Eosinophils Absolute: 0 10*3/uL (ref 0.0–0.5)
HEMATOCRIT: 28.5 % — AB (ref 38.4–49.9)
HGB: 8.7 g/dL — ABNORMAL LOW (ref 13.0–17.1)
LYMPH%: 20.3 % (ref 14.0–49.0)
MCH: 24.6 pg — AB (ref 27.2–33.4)
MCHC: 30.5 g/dL — ABNORMAL LOW (ref 32.0–36.0)
MCV: 80.5 fL (ref 79.3–98.0)
MONO#: 0.5 10*3/uL (ref 0.1–0.9)
MONO%: 16.4 % — AB (ref 0.0–14.0)
NEUT#: 1.9 10*3/uL (ref 1.5–6.5)
NEUT%: 60.7 % (ref 39.0–75.0)
PLATELETS: 244 10*3/uL (ref 140–400)
RBC: 3.54 10*6/uL — ABNORMAL LOW (ref 4.20–5.82)
RDW: 16.2 % — ABNORMAL HIGH (ref 11.0–14.6)
WBC: 3.1 10*3/uL — AB (ref 4.0–10.3)
lymph#: 0.6 10*3/uL — ABNORMAL LOW (ref 0.9–3.3)

## 2014-05-08 MED ORDER — ZOLEDRONIC ACID 4 MG/100ML IV SOLN
4.0000 mg | Freq: Once | INTRAVENOUS | Status: AC
  Administered 2014-05-08: 4 mg via INTRAVENOUS
  Filled 2014-05-08: qty 100

## 2014-05-08 MED ORDER — SODIUM CHLORIDE 0.9 % IV SOLN
Freq: Once | INTRAVENOUS | Status: AC
  Administered 2014-05-08: 09:00:00 via INTRAVENOUS

## 2014-05-08 MED ORDER — SODIUM CHLORIDE 0.9 % IV SOLN
Freq: Once | INTRAVENOUS | Status: AC
  Administered 2014-05-08: 10:00:00 via INTRAVENOUS
  Filled 2014-05-08: qty 4

## 2014-05-08 MED ORDER — DEXTROSE 5 % IV SOLN
36.0000 mg/m2 | Freq: Once | INTRAVENOUS | Status: AC
  Administered 2014-05-08: 72 mg via INTRAVENOUS
  Filled 2014-05-08: qty 36

## 2014-05-08 MED ORDER — DEXTROSE 5 % IV SOLN: 36.0000 mg/m2 | Freq: Once | INTRAVENOUS | Status: DC

## 2014-05-08 MED ORDER — HEPARIN SOD (PORK) LOCK FLUSH 100 UNIT/ML IV SOLN
500.0000 [IU] | Freq: Once | INTRAVENOUS | Status: AC | PRN
  Administered 2014-05-08: 500 [IU]
  Filled 2014-05-08: qty 5

## 2014-05-08 MED ORDER — SODIUM CHLORIDE 0.9 % IJ SOLN
10.0000 mL | INTRAMUSCULAR | Status: DC | PRN
Start: 2014-05-08 — End: 2014-05-08
  Administered 2014-05-08: 10 mL
  Filled 2014-05-08: qty 10

## 2014-05-08 NOTE — Patient Instructions (Signed)
[facility] Discharge Instructions for Patients Receiving Chemotherapy  Today you received the following chemotherapy agents Kyprolis/Zometa To help prevent nausea and vomiting after your treatment, we encourage you to take your nausea medication as prescribed.If you develop nausea and vomiting that is not controlled by your nausea medication, call the clinic.   BELOW ARE SYMPTOMS THAT SHOULD BE REPORTED IMMEDIATELY:  *FEVER GREATER THAN 100.5 F  *CHILLS WITH OR WITHOUT FEVER  NAUSEA AND VOMITING THAT IS NOT CONTROLLED WITH YOUR NAUSEA MEDICATION  *UNUSUAL SHORTNESS OF BREATH  *UNUSUAL BRUISING OR BLEEDING  TENDERNESS IN MOUTH AND THROAT WITH OR WITHOUT PRESENCE OF ULCERS  *URINARY PROBLEMS  *BOWEL PROBLEMS  UNUSUAL RASH Items with * indicate a potential emergency and should be followed up as soon as possible.  Feel free to call the clinic you have any questions or concerns. The clinic phone number is [phone].  Please show the [place] at check-in to the Emergency Department and triage nurse.

## 2014-05-08 NOTE — Telephone Encounter (Signed)
printed pt sched

## 2014-05-08 NOTE — Telephone Encounter (Signed)
Appointments made and avs will be printed for patient in chemo °

## 2014-05-08 NOTE — Progress Notes (Signed)
[facility] [phone]   [phone]  OFFICE PROGRESS NOTE  Eddie Thomas [address] 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   PRIOR THERAPY: :  1. Status post 6 cycles of systemic chemotherapy with Revlimid and Decadron, last dose was given 07/21/2010 with very good response. 2. 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 [name].  3. maintenance Revlimid at 10 mg by mouth daily status post 2 months. Therapy began 01/18/2011. 4. maintenance Revlimid at 15 mg by mouth daily with prophylactic dose Coumadin at 2 mg by mouth daily. 5. Systemic chemotherapy with Velcade at 1.3 mg per meter squared given on days 1, 4, 8 and 11 and Doxil at 30 mg per meter square given on day 4 and Decadron 40 mg by mouth on weekly basis given every 3 weeks. Status post 4 cycles. 6. Zometa 4 mg IV every 4 weeks.  7. Velcade 1.3 mg/M2 subcutaneous daily on a weekly basis with Decadron 20 mg by mouth on a weekly basis. First cycle expected on 06/21/2012. S/P 4 cycles. 8. Systemic chemotherapy with Carfilzomib, cyclophosphamide and Decadron. First cycle started on 08/09/2012. He is status post 19 cycles.   CURRENT THERAPY:  1) Systemic chemotherapy with Carfilzomib 36 MG/M2 on days 1, 2, 8, 9, 15 and 16 every 4 weeks, Pomalyst 4 mg by mouth daily for 21 days every 4 weeks and Decadron 40 mg by mouth weekly. First cycle started on 04/10/2014.   2) Zometa every 8 weeks.    INTERVAL HISTORY: Eddie Thomas 69 y.o. male returns to the clinic today for followup visit. He is currently on treatment with Carfilzomib, Pomalyst and dexamethasone status post 1 cycle and tolerating his treatment fairly well. Experienced thrombocytopenia with cycle 1. He denied having any bleeding issues, but noted some increased bruising. He denied having any  significant weight loss or night sweats. He has no fever or chills. He has no nausea or vomiting. The patient denied having any significant chest pain, shortness of breath, cough or hemoptysis. He is here to start cycle 2 day 1.  MEDICAL HISTORY: Past Medical History  Diagnosis Date  . Multiple myeloma   . Diabetes mellitus 07/03/2011  . Hypertension 07/03/2011  . Hyperlipidemia 07/03/2011  . Colon polyps 2012  . Gastric ulcer     ALLERGIES:  has No Known Allergies.  MEDICATIONS:  Current Outpatient Prescriptions  Medication Sig Dispense Refill  . Cholecalciferol (VITAMIN D-3) 5000 UNITS TABS Take 1 tablet by mouth daily.    [name] lidocaine-prilocaine (EMLA) cream APPLY CREAM TOPICALLY TO AFFECTED AREA AS DIRECTED 30 g 1  . lisinopril (PRINIVIL,ZESTRIL) 10 MG tablet Take 5 mg by mouth every evening.     [name] oxyCODONE-acetaminophen (PERCOCET/ROXICET) 5-325 MG per tablet Take 1 tablet by mouth every 6 (six) hours as needed for severe pain. 30 tablet 0  . pantoprazole (PROTONIX) 40 MG tablet Take 1 tablet (40 mg total) by mouth 2 (two) times daily. 60 tablet 11  . pioglitazone (ACTOS) 15 MG tablet Take 15 mg by mouth daily.    . pomalidomide (POMALYST) 4 MG capsule Take 1 capsule (4 mg total) by mouth daily. Take with water on days 1-21. Repeat every 28 days. 21 capsule 0  . prochlorperazine (COMPAZINE) 10 MG tablet Take 10 mg by mouth every 6 (six) hours as needed for nausea  or vomiting.    . simvastatin (ZOCOR) 10 MG tablet Take 10 mg by mouth at bedtime.      . temazepam (RESTORIL) 15 MG capsule Take 15 mg by mouth at bedtime as needed for sleep.    . valACYclovir (VALTREX) 500 MG tablet TAKE ONE TABLET BY MOUTH ONCE DAILY 30 tablet 3  . VIAGRA 50 MG tablet Take 50 mg by mouth daily as needed for erectile dysfunction.      No current facility-administered medications for this visit.    SURGICAL HISTORY:  Past Surgical History  Procedure Laterality Date  . Limbal stem cell transplant    .  Humerus fracture surgery      right  . 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    REVIEW OF SYSTEMS:  Constitutional: positive for fatigue Eyes: negative Ears, nose, mouth, throat, and face: negative Respiratory: negative Cardiovascular: negative Gastrointestinal: negative Genitourinary:negative Integument/breast: negative Hematologic/lymphatic: negative Musculoskeletal:negative Neurological: negative Behavioral/Psych: negative Endocrine: negative Allergic/Immunologic: negative   PHYSICAL EXAMINATION: General appearance: alert, cooperative and no distress Head: Normocephalic, without obvious abnormality, atraumatic Neck: no adenopathy, no JVD, supple, symmetrical, trachea midline and thyroid not enlarged, symmetric, no tenderness/mass/nodules Lymph nodes: Cervical, supraclavicular, and axillary nodes normal. Resp: clear to auscultation bilaterally Back: symmetric, no curvature. ROM normal. No CVA tenderness. Cardio: regular rate and rhythm, S1, S2 normal, no murmur, click, rub or gallop GI: soft, non-tender; bowel sounds normal; no masses,  no organomegaly Extremities: extremities normal, atraumatic, no cyanosis or edema Neurologic: Alert and oriented X 3, normal strength and tone. Normal symmetric reflexes. Normal coordination and gait  ECOG PERFORMANCE STATUS: 1 - Symptomatic but completely ambulatory  Blood pressure 135/68, pulse 65, temperature 97.8 F (36.6 C), temperature source Oral, resp. rate 18, height 5' 11"  (1.803 m), weight 172 lb 11.2 oz (78.336 kg), SpO2 100 %.  LABORATORY DATA: Lab Results  Component Value Date   WBC 3.1* 05/08/2014   HGB 8.7* 05/08/2014   HCT 28.5* 05/08/2014   MCV 80.5 05/08/2014   PLT 244 05/08/2014      Chemistry      Component Value Date/Time   NA 138 05/02/2014 0914   NA 139 08/30/2013 0935   K 4.2 05/02/2014 0914   K 4.1 08/30/2013 0935   CL 101 08/30/2013 0935   CL 103 07/12/2012 0907   CO2 24  05/02/2014 0914   CO2 26 08/30/2013 0935   BUN 13.2 05/02/2014 0914   BUN 11 08/30/2013 0935   CREATININE 0.9 05/02/2014 0914   CREATININE 1.03 08/30/2013 0935      Component Value Date/Time   CALCIUM 9.0 05/02/2014 0914   CALCIUM 9.2 08/30/2013 0935   ALKPHOS 54 05/02/2014 0914   ALKPHOS 39 08/30/2013 0935   AST 10 05/02/2014 0914   AST 13 08/30/2013 0935   ALT 14 05/02/2014 0914   ALT 9 08/30/2013 0935   BILITOT 1.56* 05/02/2014 0914   BILITOT 1.3* 08/30/2013 0935     Other lab results:   RADIOGRAPHIC STUDIES: No results found.  ASSESSMENT AND PLAN: This is a very pleasant 69 year old African American male with history of multiple myeloma currently undergoing systemic chemotherapy with Carfilzomib, cyclophosphamide and Decadron status post 19 cycles. This was discontinued secondary to disease progression. The patient has a started on systemic chemotherapy with Carfilzomib, Pomalyst and Decadron status post one cycle. He is tolerating his treatment fairly well. We will proceed with day 1 of cycle 2 today as  scheduled. Continue weekly labs. The patient would come back for follow-up visit in 4 weeks before starting cycle #3 for evaluation. He will continue on Zometa every 2 months. Due today. He was advised to call immediately if he has any concerning symptoms in the interval.  The patient voices understanding of current disease status and treatment options and is in agreement with the current care plan. All questions were answered. The patient knows to call the clinic with any problems, questions or concerns. We can certainly see the patient much sooner if necessary.  The patient was seen and examined with Dr. Julien Nordmann.  Mikey Bussing, DNP, AGPCNP-BC, AOCNP  ADDENDUM: Hematology/Oncology Attending: I had a face to face encounter with the patient. I recommended his care plan. This is a very pleasant 69 years old African-American male with relapsed multiple myeloma who is  currently undergoing systemic chemotherapy with Carfilzomib, Pomalyst and Decadron status post 1 cycle. He tolerated the first cycle of his treatment fairly well with no significant adverse effects. The patient denied having any significant fever or chills, no nausea or vomiting. We will proceed with cycle #2 today as scheduled. The patient would come back for follow-up visit in 4 weeks with the start of cycle #3. He will also continue on Zometa 4 mg IV every 2 months. He was advised to call immediately if he has any concerning symptoms in the interval.  Disclaimer: This note was dictated with voice recognition software. Similar sounding words can inadvertently be transcribed and may not be corrected upon review. Eilleen Kempf., MD 05/08/2014

## 2014-05-09 ENCOUNTER — Other Ambulatory Visit: Payer: Medicare Other

## 2014-05-09 ENCOUNTER — Ambulatory Visit (HOSPITAL_BASED_OUTPATIENT_CLINIC_OR_DEPARTMENT_OTHER): Payer: Medicare Other

## 2014-05-09 VITALS — BP 139/75 | HR 60 | Temp 98.2°F | Resp 14

## 2014-05-09 DIAGNOSIS — C9000 Multiple myeloma not having achieved remission: Secondary | ICD-10-CM

## 2014-05-09 DIAGNOSIS — Z5112 Encounter for antineoplastic immunotherapy: Secondary | ICD-10-CM

## 2014-05-09 MED ORDER — SODIUM CHLORIDE 0.9 % IJ SOLN
10.0000 mL | INTRAMUSCULAR | Status: DC | PRN
  Administered 2014-05-09: 10 mL
  Filled 2014-05-09: qty 10

## 2014-05-09 MED ORDER — SODIUM CHLORIDE 0.9 % IV SOLN
Freq: Once | INTRAVENOUS | Status: AC
  Administered 2014-05-09: 11:00:00 via INTRAVENOUS
  Filled 2014-05-09: qty 4

## 2014-05-09 MED ORDER — SODIUM CHLORIDE 0.9 % IV SOLN
Freq: Once | INTRAVENOUS | Status: AC
  Administered 2014-05-09: 11:00:00 via INTRAVENOUS

## 2014-05-09 MED ORDER — HEPARIN SOD (PORK) LOCK FLUSH 100 UNIT/ML IV SOLN
500.0000 [IU] | Freq: Once | INTRAVENOUS | Status: AC | PRN
  Administered 2014-05-09: 500 [IU]
  Filled 2014-05-09: qty 5

## 2014-05-09 MED ORDER — DEXTROSE 5 % IV SOLN
36.0000 mg/m2 | Freq: Once | INTRAVENOUS | Status: AC
  Administered 2014-05-09: 72 mg via INTRAVENOUS
  Filled 2014-05-09: qty 36

## 2014-05-15 ENCOUNTER — Telehealth: Payer: Self-pay | Admitting: Internal Medicine

## 2014-05-15 ENCOUNTER — Other Ambulatory Visit (HOSPITAL_BASED_OUTPATIENT_CLINIC_OR_DEPARTMENT_OTHER): Payer: Medicare Other

## 2014-05-15 ENCOUNTER — Other Ambulatory Visit: Payer: Self-pay | Admitting: Nurse Practitioner

## 2014-05-15 ENCOUNTER — Ambulatory Visit (HOSPITAL_BASED_OUTPATIENT_CLINIC_OR_DEPARTMENT_OTHER): Payer: Medicare Other | Admitting: Nurse Practitioner

## 2014-05-15 ENCOUNTER — Ambulatory Visit (HOSPITAL_BASED_OUTPATIENT_CLINIC_OR_DEPARTMENT_OTHER): Payer: Medicare Other

## 2014-05-15 ENCOUNTER — Other Ambulatory Visit: Payer: Self-pay | Admitting: Internal Medicine

## 2014-05-15 ENCOUNTER — Other Ambulatory Visit: Payer: Self-pay | Admitting: Physician Assistant

## 2014-05-15 ENCOUNTER — Encounter: Payer: Self-pay | Admitting: Nurse Practitioner

## 2014-05-15 VITALS — BP 145/70 | HR 65 | Temp 97.9°F | Resp 20

## 2014-05-15 DIAGNOSIS — C9000 Multiple myeloma not having achieved remission: Secondary | ICD-10-CM

## 2014-05-15 DIAGNOSIS — L539 Erythematous condition, unspecified: Secondary | ICD-10-CM | POA: Diagnosis present

## 2014-05-15 DIAGNOSIS — Z5112 Encounter for antineoplastic immunotherapy: Secondary | ICD-10-CM | POA: Diagnosis present

## 2014-05-15 DIAGNOSIS — R609 Edema, unspecified: Secondary | ICD-10-CM | POA: Diagnosis not present

## 2014-05-15 DIAGNOSIS — L0390 Cellulitis, unspecified: Secondary | ICD-10-CM | POA: Insufficient documentation

## 2014-05-15 LAB — CBC WITH DIFFERENTIAL/PLATELET
BASO%: 0.3 % (ref 0.0–2.0)
Basophils Absolute: 0 10*3/uL (ref 0.0–0.1)
EOS%: 3 % (ref 0.0–7.0)
Eosinophils Absolute: 0.1 10*3/uL (ref 0.0–0.5)
HCT: 28.7 % — ABNORMAL LOW (ref 38.4–49.9)
HGB: 9 g/dL — ABNORMAL LOW (ref 13.0–17.1)
LYMPH%: 16.9 % (ref 14.0–49.0)
MCH: 24.5 pg — ABNORMAL LOW (ref 27.2–33.4)
MCHC: 31.4 g/dL — ABNORMAL LOW (ref 32.0–36.0)
MCV: 78.2 fL — ABNORMAL LOW (ref 79.3–98.0)
MONO#: 0.2 10*3/uL (ref 0.1–0.9)
MONO%: 6.8 % (ref 0.0–14.0)
NEUT#: 2.5 10*3/uL (ref 1.5–6.5)
NEUT%: 73 % (ref 39.0–75.0)
PLATELETS: 105 10*3/uL — AB (ref 140–400)
RBC: 3.67 10*6/uL — AB (ref 4.20–5.82)
RDW: 16.5 % — AB (ref 11.0–14.6)
WBC: 3.4 10*3/uL — ABNORMAL LOW (ref 4.0–10.3)
lymph#: 0.6 10*3/uL — ABNORMAL LOW (ref 0.9–3.3)
nRBC: 0 % (ref 0–0)

## 2014-05-15 LAB — COMPREHENSIVE METABOLIC PANEL (CC13)
ALK PHOS: 51 U/L (ref 40–150)
ALT: 12 U/L (ref 0–55)
AST: 11 U/L (ref 5–34)
Albumin: 3.4 g/dL — ABNORMAL LOW (ref 3.5–5.0)
Anion Gap: 12 mEq/L — ABNORMAL HIGH (ref 3–11)
BUN: 13.4 mg/dL (ref 7.0–26.0)
CALCIUM: 8.9 mg/dL (ref 8.4–10.4)
CHLORIDE: 105 meq/L (ref 98–109)
CO2: 22 mEq/L (ref 22–29)
Creatinine: 1 mg/dL (ref 0.7–1.3)
Glucose: 134 mg/dl (ref 70–140)
Potassium: 4.6 mEq/L (ref 3.5–5.1)
Sodium: 139 mEq/L (ref 136–145)
Total Bilirubin: 1.29 mg/dL — ABNORMAL HIGH (ref 0.20–1.20)
Total Protein: 5.7 g/dL — ABNORMAL LOW (ref 6.4–8.3)

## 2014-05-15 LAB — TECHNOLOGIST REVIEW

## 2014-05-15 MED ORDER — CEPHALEXIN 500 MG PO CAPS: 500.0000 mg | ORAL_CAPSULE | Freq: Four times a day (QID) | ORAL | Status: DC

## 2014-05-15 MED ORDER — SODIUM CHLORIDE 0.9 % IV SOLN
Freq: Once | INTRAVENOUS | Status: AC
  Administered 2014-05-15: 10:00:00 via INTRAVENOUS
  Filled 2014-05-15: qty 4

## 2014-05-15 MED ORDER — DEXTROSE 5 % IV SOLN
36.0000 mg/m2 | Freq: Once | INTRAVENOUS | Status: AC
  Administered 2014-05-15: 72 mg via INTRAVENOUS
  Filled 2014-05-15: qty 36

## 2014-05-15 MED ORDER — SODIUM CHLORIDE 0.9 % IV SOLN
Freq: Once | INTRAVENOUS | Status: AC
  Administered 2014-05-15: 10:00:00 via INTRAVENOUS

## 2014-05-15 NOTE — Telephone Encounter (Signed)
s.wl. pt and he will pick up sched from chemo room

## 2014-05-15 NOTE — Patient Instructions (Signed)
[facility] Discharge Instructions for Patients Receiving Chemotherapy  Today you received the following chemotherapy agents Kyprolis.  To help prevent nausea and vomiting after your treatment, we encourage you to take your nausea medication as prescribed.   If you develop nausea and vomiting that is not controlled by your nausea medication, call the clinic.   BELOW ARE SYMPTOMS THAT SHOULD BE REPORTED IMMEDIATELY:  *FEVER GREATER THAN 100.5 F  *CHILLS WITH OR WITHOUT FEVER  NAUSEA AND VOMITING THAT IS NOT CONTROLLED WITH YOUR NAUSEA MEDICATION  *UNUSUAL SHORTNESS OF BREATH  *UNUSUAL BRUISING OR BLEEDING  TENDERNESS IN MOUTH AND THROAT WITH OR WITHOUT PRESENCE OF ULCERS  *URINARY PROBLEMS  *BOWEL PROBLEMS  UNUSUAL RASH Items with * indicate a potential emergency and should be followed up as soon as possible.  Feel free to call the clinic you have any questions or concerns. The clinic phone number is [phone].  Please show the CHEMO ALERT CARD at check-in to the Emergency Department and triage nurse.   

## 2014-05-15 NOTE — Progress Notes (Signed)
Patient here for kyprolis.  C/O of PAC site being red and tender to touch.  PAC site is red and some swelling.   Selena Lesser NP here to assess PAC site. Per Dr. Julien Nordmann - will do peripheral IV today and tomorrow and put patient on antibiotics.  Cyndee here and explained process to patient.  He knows he is to call us for any worsening of this site or if it does not continue to improve. Patient states he took his decadron at home this morning with milk. Patient states he does not get extra prehydration fluids.

## 2014-05-15 NOTE — Assessment & Plan Note (Signed)
Patient's right upper chest Port-A-Cath site with erythema, mild edema, mild tenderness, and warmth.  No red streaks surrounding site.  No distended veins.  Also, no edema of the right upper extremity.  ANC is 2.5; and patient is afebrile today.  He denies any recent fevers or chills as well.  Will treat patient for probable cellulitis surrounding right Port-A-Cath site with Keflex antibiotics.  Patient will proceed today with chemotherapy via peripheral IV.  Patient will return tomorrow for cycle 2, day 9 of his same chemotherapy regimen.  Have advised patient will need to re-assess the right Port-A-Cath area in the morning as well.  After tomorrow's chemotherapy-patient will be scheduled for labs and his next chemotherapy on 05/22/2014.  Will schedule a follow-up visit for that same day so can once again assess Port-A-Cath site prior to chemotherapy.

## 2014-05-15 NOTE — Assessment & Plan Note (Addendum)
Patient presents to the [facility] today to receive cycle 2, day 8 of his Kyprolis chemotherapy.  His counts remained fairly stable; with an ANC of 2.5, hemoglobin of 9.0, and a platelet count of 105.    Patient's right upper chest Port-A-Cath site with erythema, mild edema, mild tenderness, and warmth.  No red streaks surrounding site.  No distended veins.  Also, no edema of the right upper extremity.  Will treat patient for probable cellulitis surrounding right Port-A-Cath site with Keflex antibiotics.  Patient will proceed today with chemotherapy via peripheral IV.  Patient will also continue with his oral Pomalyst as previously directed.  Patient received his last Zometa infusion on 05/08/2014.  He receives Zometa infusion on an every 8 week regimen.  He will be due for his next Zometa infusion around 07/08/2014.  Patient will return tomorrow for cycle 2, day 9 of his same chemotherapy regimen.  Have advised patient will need to re-assess the right Port-A-Cath area in the morning as well.  After tomorrow's chemotherapy-patient will be scheduled for labs and his next chemotherapy on 05/22/2014.  Will schedule a follow-up visit for that same day so can once again assess Port-A-Cath site prior to chemotherapy.

## 2014-05-15 NOTE — Telephone Encounter (Signed)
Patient stopped by scheduling for appointments and was given avs report and appointments for April thru May.

## 2014-05-15 NOTE — Progress Notes (Signed)
SYMPTOM MANAGEMENT CLINIC   HPI: Eddie Thomas 69 y.o. male diagnosed with multiple myeloma.  Currently undergoing Kyprolis chemotherapy.  Patient presented to the [facility] today to receive cycle 2, day 8 of his Kyprolis chemotherapy.  He states he's been tolerating his chemotherapy fairly well; with minimal side effects. His counts remained fairly stable; with an ANC of 2.5, hemoglobin of 9.0, and a platelet count of 105.    Patient's right upper chest Port-A-Cath site with erythema, mild edema, mild tenderness, and warmth for approximately one week.  No red streaks surrounding site.  No distended veins.  Also, no edema of the right upper extremity.  Patient denies any fevers or chills recently.  Patient is afebrile while at the cancer center.  HPI  ROS  Past Medical History  Diagnosis Date  . Multiple myeloma   . Diabetes mellitus 07/03/2011  . Hypertension 07/03/2011  . Hyperlipidemia 07/03/2011  . Colon polyps 2012  . Gastric ulcer     Past Surgical History  Procedure Laterality Date  . Limbal stem cell transplant    . Humerus fracture surgery      right  . 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    has No Known Allergies.    Medication List       This list is accurate as of: 05/15/14 11:32 AM.  Always use your most recent med list.               cephALEXin 500 MG capsule  Commonly known as:  KEFLEX  Take 1 capsule (500 mg total) by mouth 4 (four) times daily.     lidocaine-prilocaine cream  Commonly known as:  EMLA  APPLY  CREAM TOPICALLY TO AFFECTED AREA AS DIRECTED     lisinopril 10 MG tablet  Commonly known as:  PRINIVIL,ZESTRIL  Take 5 mg by mouth every evening.     oxyCODONE-acetaminophen 5-325 MG per tablet  Commonly known as:  PERCOCET/ROXICET  Take 1 tablet by mouth every 6 (six) hours as needed for severe pain.     pantoprazole 40 MG tablet  Commonly known as:  PROTONIX  Take 1 tablet (40 mg total) by mouth 2 (two) times daily.     pioglitazone 15 MG tablet  Commonly known as:  ACTOS  Take 15 mg by mouth daily.     pomalidomide 4 MG capsule  Commonly known as:  POMALYST  Take 1 capsule (4 mg total) by mouth daily. Take with water on days 1-21. Repeat every 28 days.     prochlorperazine 10 MG tablet  Commonly known as:  COMPAZINE  Take 10 mg by mouth every 6 (six) hours as needed for nausea or vomiting.     simvastatin 10 MG tablet  Commonly known as:  ZOCOR  Take 10 mg by mouth at bedtime.     temazepam 15 MG capsule  Commonly known as:  RESTORIL  Take 15 mg by mouth at bedtime as needed for sleep.     valACYclovir 500 MG tablet  Commonly known as:  VALTREX  TAKE ONE TABLET BY MOUTH ONCE DAILY     VIAGRA 50 MG tablet  Generic drug:  sildenafil  Take 50 mg by mouth daily as needed for erectile dysfunction.     Vitamin D-3  5000 UNITS Tabs  Take 1 tablet by mouth daily.         PHYSICAL EXAMINATION  Oncology Vitals 05/15/2014 05/09/2014 05/08/2014 04/25/2014 04/24/2014 04/18/2014 04/17/2014  Height - - 180 cm - 180 cm - -  Weight - - 78.336 kg - 79.379 kg - -  Weight (lbs) - - 172 lbs 11 oz - 175 lbs - -  BMI (kg/m2) - - 24.09 kg/m2 - 24.41 kg/m2 - -  Temp 97.9 98.2 97.8 97.7 98.4 97 98  Pulse 65 60 65 59 75 57 59  Resp 20 14 18 18 20 18 20   Resp (Historical as of 08/21/11) - - - - - - -  SpO2 100 - 100 100 100 100 100  BSA (m2) - - 1.98 m2 - 1.99 m2 - -   BP Readings from Last 3 Encounters:  05/15/14 145/70  05/09/14 139/75  05/08/14 135/68    Physical Exam  Constitutional: He is oriented to person, place, and time and well-developed, well-nourished, and in no distress.  HENT:  Head: Normocephalic and atraumatic.  Eyes:  Conjunctivae and EOM are normal. Pupils are equal, round, and reactive to light. Right eye exhibits no discharge. Left eye exhibits no discharge. No scleral icterus.  Neck: Normal range of motion.  Pulmonary/Chest: Effort normal. No respiratory distress. He exhibits tenderness.  See Port-A-Cath note in skin section.  Musculoskeletal: Normal range of motion. He exhibits edema and tenderness.  See Port-A-Cath noted in skin section.  Neurological: He is alert and oriented to person, place, and time.  Skin: Skin is warm and dry. No rash noted. There is erythema. No pallor.  Right upper chest Port-A-Cath site with surrounding erythema, edema, warmth, and mild tenderness.  No red streaks surrounding site.  Also, no edema or erythema to right upper extremity or neck.  Psychiatric: Affect normal.  Nursing note and vitals reviewed.   LABORATORY DATA:. Appointment on 05/15/2014  Component Date Value Ref Range Status  . WBC 05/15/2014 3.4* 4.0 - 10.3 10e3/uL Final  . NEUT# 05/15/2014 2.5  1.5 - 6.5 10e3/uL Final  . HGB 05/15/2014 9.0* 13.0 - 17.1 g/dL Final  . HCT 05/15/2014 28.7* 38.4 - 49.9 % Final  . Platelets 05/15/2014 105* 140 - 400 10e3/uL Final  . MCV 05/15/2014 78.2* 79.3 - 98.0 fL Final  . MCH 05/15/2014 24.5* 27.2 - 33.4 pg Final  . MCHC 05/15/2014 31.4* 32.0 - 36.0 g/dL Final  . RBC 05/15/2014 3.67* 4.20 - 5.82 10e6/uL Final  . RDW 05/15/2014 16.5* 11.0 - 14.6 % Final  . lymph# 05/15/2014 0.6* 0.9 - 3.3 10e3/uL Final  . MONO# 05/15/2014 0.2  0.1 - 0.9 10e3/uL Final  . Eosinophils Absolute 05/15/2014 0.1  0.0 - 0.5 10e3/uL Final  . Basophils Absolute 05/15/2014 0.0  0.0 - 0.1 10e3/uL Final  . NEUT% 05/15/2014 73.0  39.0 - 75.0 % Final  . LYMPH% 05/15/2014 16.9  14.0 - 49.0 % Final  . MONO% 05/15/2014 6.8  0.0 - 14.0 % Final  . EOS% 05/15/2014 3.0  0.0 - 7.0 % Final  . BASO% 05/15/2014 0.3  0.0 - 2.0 % Final  . nRBC 05/15/2014 0  0 - 0 % Final  . Sodium 05/15/2014 139  136 - 145  mEq/L Final  . Potassium 05/15/2014 4.6  3.5 - 5.1 mEq/L Final  . Chloride 05/15/2014 105  98 - 109 mEq/L Final  . CO2 05/15/2014 22  22 - 29 mEq/L Final  . Glucose 05/15/2014 134  70 - 140 mg/dl Final  . BUN 05/15/2014 13.4  7.0 - 26.0 mg/dL Final  . Creatinine 05/15/2014 1.0  0.7 - 1.3 mg/dL Final  . Total Bilirubin 05/15/2014 1.29* 0.20 - 1.20 mg/dL Final  . Alkaline Phosphatase 05/15/2014 51  40 - 150 U/L Final  . AST 05/15/2014 11  5 - 34 U/L Final  . ALT 05/15/2014 12  0 - 55 U/L Final  . Total Protein 05/15/2014 5.7* 6.4 - 8.3 g/dL Final  . Albumin 05/15/2014 3.4* 3.5 - 5.0 g/dL Final  . Calcium 05/15/2014 8.9  8.4 - 10.4 mg/dL Final  . Anion Gap 05/15/2014 12* 3 - 11 mEq/L Final  . EGFR 05/15/2014 >90  >90 ml/min/1.73 m2 Final   eGFR is calculated using the CKD-EPI Creatinine Equation (2009)  . Technologist Review 05/15/2014 Mod RBC fragments   Final     RADIOGRAPHIC STUDIES: No results found.  ASSESSMENT/PLAN:    Multiple myeloma Patient presents to the [facility] today to receive cycle 2, day 8 of his Kyprolis chemotherapy.  His counts remained fairly stable; with an ANC of 2.5, hemoglobin of 9.0, and a platelet count of 105.    Patient's right upper chest Port-A-Cath site with erythema, mild edema, mild tenderness, and warmth.  No red streaks surrounding site.  No distended veins.  Also, no edema of the right upper extremity.  Will treat patient for probable cellulitis surrounding right Port-A-Cath site with Keflex antibiotics.  Patient will proceed today with chemotherapy via peripheral IV.  Patient will also continue with his oral Pomalyst as previously directed.  Patient received his last Zometa infusion on 05/08/2014.  He receives Zometa infusion on an every 8 week regimen.  He will be due for his next Zometa infusion around 07/08/2014.  Patient will return tomorrow for cycle 2, day 9 of his same chemotherapy regimen.  Have advised patient will need to re-assess  the right Port-A-Cath area in the morning as well.  After tomorrow's chemotherapy-patient will be scheduled for labs and his next chemotherapy on 05/22/2014.  Will schedule a follow-up visit for that same day so can once again assess Port-A-Cath site prior to chemotherapy.   Cellulitis Patient's right upper chest Port-A-Cath site with erythema, mild edema, mild tenderness, and warmth.  No red streaks surrounding site.  No distended veins.  Also, no edema of the right upper extremity.  ANC is 2.5; and patient is afebrile today.  He denies any recent fevers or chills as well.  Will treat patient for probable cellulitis surrounding right Port-A-Cath site with Keflex antibiotics.  Patient will proceed today with chemotherapy via peripheral IV.  Patient will return tomorrow for cycle 2, day 9 of his same chemotherapy regimen.  Have advised patient will need to re-assess the right Port-A-Cath area in the morning as well.  After tomorrow's chemotherapy-patient will be scheduled for labs and his next chemotherapy on 05/22/2014.  Will schedule a follow-up visit for that same day so can once again assess Port-A-Cath site prior to chemotherapy.    Hyperbilirubinemia Bilirubin has increased from 1.05 up to 1.29.  Patient denies any GI symptoms whatsoever.  Will continue to monitor closely.    Patient stated understanding of all instructions; and was in agreement with this plan of care. The patient knows to call the clinic with any problems, questions or concerns.   Review/collaboration with Dr. Julien Nordmann regarding all aspects of patient's visit today.   Total time spent with patient was 25 minutes;  with greater  than 75 percent of that time spent in face to face counseling regarding patient's symptoms,  and coordination of care and follow up.  Disclaimer: This note was dictated with voice recognition software. Similar sounding words can inadvertently be transcribed and may not be corrected upon  review.   Drue Second, NP 05/15/2014

## 2014-05-15 NOTE — Assessment & Plan Note (Signed)
Bilirubin has increased from 1.05 up to 1.29.  Patient denies any GI symptoms whatsoever.  Will continue to monitor closely.

## 2014-05-16 ENCOUNTER — Ambulatory Visit (HOSPITAL_BASED_OUTPATIENT_CLINIC_OR_DEPARTMENT_OTHER): Payer: Medicare Other

## 2014-05-16 ENCOUNTER — Other Ambulatory Visit: Payer: Self-pay | Admitting: Nurse Practitioner

## 2014-05-16 VITALS — BP 128/65 | HR 60 | Temp 98.4°F | Resp 18

## 2014-05-16 DIAGNOSIS — C9000 Multiple myeloma not having achieved remission: Secondary | ICD-10-CM

## 2014-05-16 DIAGNOSIS — Z5112 Encounter for antineoplastic immunotherapy: Secondary | ICD-10-CM | POA: Diagnosis present

## 2014-05-16 MED ORDER — DEXTROSE 5 % IV SOLN
36.0000 mg/m2 | Freq: Once | INTRAVENOUS | Status: AC
  Administered 2014-05-16: 72 mg via INTRAVENOUS
  Filled 2014-05-16: qty 36

## 2014-05-16 MED ORDER — SODIUM CHLORIDE 0.9 % IV SOLN
Freq: Once | INTRAVENOUS | Status: AC
  Administered 2014-05-16: 11:00:00 via INTRAVENOUS
  Filled 2014-05-16: qty 4

## 2014-05-16 MED ORDER — SODIUM CHLORIDE 0.9 % IV SOLN
Freq: Once | INTRAVENOUS | Status: AC
  Administered 2014-05-16: 11:00:00 via INTRAVENOUS

## 2014-05-16 NOTE — Progress Notes (Signed)
Patient initiated Keflex antibiotics just today afternoon for questionable cellulitis surrounding right upper chest Port-A-Cath site.  On exam today-it appears that right Port-A-Cath site with less erythema, edema, or tenderness.  Patient was advised to continue taking his Keflex as previously directed.  Also, clarification of schedule: Patient will return for labs and a follow-up visit on Friday, 05/19/2014.  He'll return on Monday, 05/22/2014 for chemotherapy only.

## 2014-05-16 NOTE — Patient Instructions (Signed)
[facility] Discharge Instructions for Patients Receiving Chemotherapy  Today you received the following chemotherapy agents: Kyprolis   To help prevent nausea and vomiting after your treatment, we encourage you to take your nausea medication as directed.    If you develop nausea and vomiting that is not controlled by your nausea medication, call the clinic.   BELOW ARE SYMPTOMS THAT SHOULD BE REPORTED IMMEDIATELY:  *FEVER GREATER THAN 100.5 F  *CHILLS WITH OR WITHOUT FEVER  NAUSEA AND VOMITING THAT IS NOT CONTROLLED WITH YOUR NAUSEA MEDICATION  *UNUSUAL SHORTNESS OF BREATH  *UNUSUAL BRUISING OR BLEEDING  TENDERNESS IN MOUTH AND THROAT WITH OR WITHOUT PRESENCE OF ULCERS  *URINARY PROBLEMS  *BOWEL PROBLEMS  UNUSUAL RASH Items with * indicate a potential emergency and should be followed up as soon as possible.  Feel free to call the clinic you have any questions or concerns. The clinic phone number is [phone].  Please show the CHEMO ALERT CARD at check-in to the Emergency Department and triage nurse.   

## 2014-05-19 ENCOUNTER — Encounter: Payer: Self-pay | Admitting: Nurse Practitioner

## 2014-05-19 ENCOUNTER — Ambulatory Visit (HOSPITAL_BASED_OUTPATIENT_CLINIC_OR_DEPARTMENT_OTHER): Payer: Medicare Other | Admitting: Nurse Practitioner

## 2014-05-19 ENCOUNTER — Other Ambulatory Visit (HOSPITAL_BASED_OUTPATIENT_CLINIC_OR_DEPARTMENT_OTHER): Payer: Medicare Other

## 2014-05-19 VITALS — BP 158/82 | HR 57 | Temp 97.7°F | Resp 20 | Ht 71.0 in | Wt 178.6 lb

## 2014-05-19 DIAGNOSIS — C9000 Multiple myeloma not having achieved remission: Secondary | ICD-10-CM | POA: Diagnosis present

## 2014-05-19 DIAGNOSIS — L03818 Cellulitis of other sites: Secondary | ICD-10-CM

## 2014-05-19 LAB — CBC WITH DIFFERENTIAL/PLATELET
BASO%: 0.3 % (ref 0.0–2.0)
BASOS ABS: 0 10*3/uL (ref 0.0–0.1)
EOS ABS: 0 10*3/uL (ref 0.0–0.5)
EOS%: 1.2 % (ref 0.0–7.0)
HEMATOCRIT: 28.6 % — AB (ref 38.4–49.9)
HGB: 9 g/dL — ABNORMAL LOW (ref 13.0–17.1)
LYMPH%: 18.2 % (ref 14.0–49.0)
MCH: 24.5 pg — ABNORMAL LOW (ref 27.2–33.4)
MCHC: 31.5 g/dL — AB (ref 32.0–36.0)
MCV: 77.7 fL — AB (ref 79.3–98.0)
MONO#: 0.6 10*3/uL (ref 0.1–0.9)
MONO%: 17.3 % — ABNORMAL HIGH (ref 0.0–14.0)
NEUT#: 2.1 10*3/uL (ref 1.5–6.5)
NEUT%: 63 % (ref 39.0–75.0)
NRBC: 1 % — AB (ref 0–0)
PLATELETS: 101 10*3/uL — AB (ref 140–400)
RBC: 3.68 10*6/uL — ABNORMAL LOW (ref 4.20–5.82)
RDW: 16.2 % — AB (ref 11.0–14.6)
WBC: 3.4 10*3/uL — AB (ref 4.0–10.3)
lymph#: 0.6 10*3/uL — ABNORMAL LOW (ref 0.9–3.3)

## 2014-05-19 LAB — COMPREHENSIVE METABOLIC PANEL (CC13)
ALT: 13 U/L (ref 0–55)
ANION GAP: 9 meq/L (ref 3–11)
AST: 9 U/L (ref 5–34)
Albumin: 3.5 g/dL (ref 3.5–5.0)
Alkaline Phosphatase: 51 U/L (ref 40–150)
BUN: 13.8 mg/dL (ref 7.0–26.0)
CO2: 24 mEq/L (ref 22–29)
Calcium: 8.7 mg/dL (ref 8.4–10.4)
Chloride: 105 mEq/L (ref 98–109)
Creatinine: 0.9 mg/dL (ref 0.7–1.3)
EGFR: >90 mL/min/{1.73_m2} (ref >90)
GLUCOSE: 119 mg/dL (ref 70–140)
POTASSIUM: 4.1 meq/L (ref 3.5–5.1)
SODIUM: 138 meq/L (ref 136–145)
Total Bilirubin: 1.77 mg/dL — ABNORMAL HIGH (ref 0.20–1.20)
Total Protein: 5.5 g/dL — ABNORMAL LOW (ref 6.4–8.3)

## 2014-05-19 NOTE — Progress Notes (Signed)
    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   PRIOR THERAPY: :  1. Status post 6 cycles of systemic chemotherapy with Revlimid and Decadron, last dose was given 07/21/2010 with very good response. 2. 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 Shea.  3. maintenance Revlimid at 10 mg by mouth daily status post 2 months. Therapy began 01/18/2011. 4. maintenance Revlimid at 15 mg by mouth daily with prophylactic dose Coumadin at 2 mg by mouth daily. 5. Systemic chemotherapy with Velcade at 1.3 mg per meter squared given on days 1, 4, 8 and 11 and Doxil at 30 mg per meter square given on day 4 and Decadron 40 mg by mouth on weekly basis given every 3 weeks. Status post 4 cycles. 6. Zometa 4 mg IV every 4 weeks.  7. Velcade 1.3 mg/M2 subcutaneous daily on a weekly basis with Decadron 20 mg by mouth on a weekly basis. First cycle expected on 06/21/2012. S/P 4 cycles. 8. Systemic chemotherapy with Carfilzomib, cyclophosphamide and Decadron. First cycle started on 08/09/2012. He is status post 19 cycles.   CURRENT THERAPY:  1) Systemic chemotherapy with Carfilzomib 36 MG/M2 on days 1, 2, 8, 9, 15 and 16 every 4 weeks, Pomalyst 4 mg by mouth daily for 21 days every 4 weeks and Decadron 40 mg by mouth weekly. First cycle started on 04/10/2014.   2) Zometa every 8 weeks.    INTERVAL HISTORY: Eddie Thomas 69 y.o. male returns to the clinic today for followup visit. He is currently on treatment with Carfilzomib, Pomalyst and dexamethasone status post half of cycle 2 and is tolerating his treatment fairly well. He is here for reevaluation of the cellulitis site to his right chest port. He was started on keflex 5 days ago for erythema,  mild edema, tenderness, and warmth to this area. The tenderness continues, but is improved, as is the erythema.    MEDICAL HISTORY: Past Medical History  Diagnosis Date  . Multiple myeloma   . Diabetes mellitus 07/03/2011  . Hypertension 07/03/2011  . Hyperlipidemia 07/03/2011  . Colon polyps 2012  . Gastric ulcer     ALLERGIES:  has No Known Allergies.  MEDICATIONS:  Current Outpatient Prescriptions  Medication Sig Dispense Refill  . cephALEXin (KEFLEX) 500 MG capsule Take 1 capsule (500 mg total) by mouth 4 (four) times daily. 40 capsule 0  . Cholecalciferol (VITAMIN D-3) 5000 UNITS TABS Take 1 tablet by mouth daily.    . lisinopril (PRINIVIL,ZESTRIL) 10 MG tablet Take 5 mg by mouth every evening.     . pantoprazole (PROTONIX) 40 MG tablet Take 1 tablet (40 mg total) by mouth 2 (two) times daily. 60 tablet 11  . pioglitazone (ACTOS) 15 MG tablet Take 15 mg by mouth daily.    . pomalidomide (POMALYST) 4 MG capsule Take 1 capsule (4 mg total) by mouth daily. Take with water on days 1-21. Repeat every 28 days. 21 capsule 0  . simvastatin (ZOCOR) 10 MG tablet Take 10 mg by mouth at bedtime.      . valACYclovir (VALTREX) 500 MG tablet TAKE ONE TABLET BY MOUTH ONCE DAILY 30 tablet 3  . dexamethasone (DECADRON) 4 MG tablet TAKE FIVE TABLETS BY MOUTH EVERY WEEK START WITH THE FIRST   DOSE OF CHEMOTHERAPY (Patient not taking: Reported on 05/19/2014) 80 tablet 0  . lidocaine-prilocaine (EMLA) cream APPLY  CREAM TOPICALLY TO AFFECTED AREA AS DIRECTED (Patient not taking: Reported on 05/19/2014) 10 g 0  . oxyCODONE-acetaminophen (PERCOCET/ROXICET) 5-325 MG per tablet Take 1 tablet by mouth every 6 (six) hours as needed for severe pain. (Patient not taking: Reported on 05/19/2014) 30 tablet 0  . prochlorperazine (COMPAZINE) 10 MG tablet Take 10 mg by mouth every 6 (six) hours as needed for nausea or vomiting.    . temazepam (RESTORIL) 15 MG capsule Take 15 mg by mouth at bedtime as needed for sleep.      . VIAGRA 50 MG tablet Take 50 mg by mouth daily as needed for erectile dysfunction.      No current facility-administered medications for this visit.    SURGICAL HISTORY:  Past Surgical History  Procedure Laterality Date  . Limbal stem cell transplant    . Humerus fracture surgery      right  . 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    REVIEW OF SYSTEMS:  Constitutional: positive for fatigue Eyes: negative Ears, nose, mouth, throat, and face: negative Respiratory: negative Cardiovascular: negative Gastrointestinal: negative Genitourinary:negative Integument/breast: positive for rash Hematologic/lymphatic: negative Musculoskeletal:negative Neurological: negative Behavioral/Psych: negative Endocrine: negative Allergic/Immunologic: negative   PHYSICAL EXAMINATION: General appearance: alert, cooperative and no distress Head: Normocephalic, without obvious abnormality, atraumatic Neck: no adenopathy, no JVD, supple, symmetrical, trachea midline and thyroid not enlarged, symmetric, no tenderness/mass/nodules Lymph nodes: Cervical, supraclavicular, and axillary nodes normal. Resp: clear to auscultation bilaterally Back: symmetric, no curvature. ROM normal. No CVA tenderness. Cardio: regular rate and rhythm, S1, S2 normal, no murmur, click, rub or gallop GI: soft, non-tender; bowel sounds normal; no masses,  no organomegaly Extremities: extremities normal, atraumatic, no cyanosis or edema Neurologic: Alert and oriented X 3, normal strength and tone. Normal symmetric reflexes. Normal coordination and gait  ECOG PERFORMANCE STATUS: 1 - Symptomatic but completely ambulatory  Blood pressure 158/82, pulse 57, temperature 97.7 F (36.5 C), temperature source Oral, resp. rate 20, height 5' 11" (1.803 m), weight 178 lb 9.6 oz (81.012 kg), SpO2 100 %.  LABORATORY DATA: Lab Results  Component Value Date   WBC 3.4* 05/19/2014   HGB 9.0* 05/19/2014   HCT 28.6*  05/19/2014   MCV 77.7* 05/19/2014   PLT 101* 05/19/2014      Chemistry      Component Value Date/Time   NA 138 05/19/2014 1019   NA 139 08/30/2013 0935   K 4.1 05/19/2014 1019   K 4.1 08/30/2013 0935   CL 101 08/30/2013 0935   CL 103 07/12/2012 0907   CO2 24 05/19/2014 1019   CO2 26 08/30/2013 0935   BUN 13.8 05/19/2014 1019   BUN 11 08/30/2013 0935   CREATININE 0.9 05/19/2014 1019   CREATININE 1.03 08/30/2013 0935      Component Value Date/Time   CALCIUM 8.7 05/19/2014 1019   CALCIUM 9.2 08/30/2013 0935   ALKPHOS 51 05/19/2014 1019   ALKPHOS 39 08/30/2013 0935   AST 9 05/19/2014 1019   AST 13 08/30/2013 0935   ALT 13 05/19/2014 1019   ALT 9 08/30/2013 0935   BILITOT 1.77* 05/19/2014 1019   BILITOT 1.3* 08/30/2013 0935     Other lab results:   RADIOGRAPHIC STUDIES: No results found.  ASSESSMENT AND PLAN: This is a very pleasant 68 year old African American male with history of multiple myeloma currently   undergoing systemic chemotherapy with Carfilzomib, cyclophosphamide and Decadron status post 19 cycles. This was discontinued secondary to disease progression. The patient has a started on systemic chemotherapy with Carfilzomib, Pomalyst and Decadron status half of cycle 2. He is tolerating his treatment fairly well. Treatment related anemia continues, but patient is asymptomatic. Hgb up to 9.0.  Bilirubin is elevated to 1.7. Will continue to monitor this value. No abdominal symptoms Per Dr. Mohamad, ok to proceed with day 15 of cycle 3 on Monday as scheduled. Continue weekly labs. He will next be due for zometa on 07/03/14.  Continue keflex prescription, 5 days left of this treatment The patient will come back for follow-up visit on 5/16  before starting cycle #3 for evaluation.  He was advised to call immediately if he has any concerning symptoms in the interval.  The patient voices understanding of current disease status and treatment options and is in agreement with  the current care plan. All questions were answered. The patient knows to call the clinic with any problems, questions or concerns. We can certainly see the patient much sooner if necessary.   F , NP 05/19/2014     

## 2014-05-22 ENCOUNTER — Other Ambulatory Visit: Payer: Medicare Other

## 2014-05-22 ENCOUNTER — Ambulatory Visit (HOSPITAL_BASED_OUTPATIENT_CLINIC_OR_DEPARTMENT_OTHER): Payer: Medicare Other

## 2014-05-22 VITALS — BP 142/75 | HR 69 | Temp 97.2°F | Resp 18

## 2014-05-22 DIAGNOSIS — Z5112 Encounter for antineoplastic immunotherapy: Secondary | ICD-10-CM

## 2014-05-22 DIAGNOSIS — C9000 Multiple myeloma not having achieved remission: Secondary | ICD-10-CM

## 2014-05-22 MED ORDER — SODIUM CHLORIDE 0.9 % IV SOLN
Freq: Once | INTRAVENOUS | Status: AC
  Administered 2014-05-22: 11:00:00 via INTRAVENOUS

## 2014-05-22 MED ORDER — SODIUM CHLORIDE 0.9 % IV SOLN
Freq: Once | INTRAVENOUS | Status: AC
  Administered 2014-05-22: 11:00:00 via INTRAVENOUS
  Filled 2014-05-22: qty 4

## 2014-05-22 MED ORDER — SODIUM CHLORIDE 0.9 % IV SOLN
Freq: Once | INTRAVENOUS | Status: AC
  Administered 2014-05-22: 12:00:00 via INTRAVENOUS

## 2014-05-22 MED ORDER — DEXTROSE 5 % IV SOLN
36.0000 mg/m2 | Freq: Once | INTRAVENOUS | Status: AC
  Administered 2014-05-22: 72 mg via INTRAVENOUS
  Filled 2014-05-22: qty 36

## 2014-05-22 NOTE — Patient Instructions (Signed)
[facility] Discharge Instructions for Patients Receiving Chemotherapy  Today you received the following chemotherapy agents : Kyprolis.  To help prevent nausea and vomiting after your treatment, we encourage you to take your nausea medication  As prescribed.   If you develop nausea and vomiting that is not controlled by your nausea medication, call the clinic.   BELOW ARE SYMPTOMS THAT SHOULD BE REPORTED IMMEDIATELY:  *FEVER GREATER THAN 100.5 F  *CHILLS WITH OR WITHOUT FEVER  NAUSEA AND VOMITING THAT IS NOT CONTROLLED WITH YOUR NAUSEA MEDICATION  *UNUSUAL SHORTNESS OF BREATH  *UNUSUAL BRUISING OR BLEEDING  TENDERNESS IN MOUTH AND THROAT WITH OR WITHOUT PRESENCE OF ULCERS  *URINARY PROBLEMS  *BOWEL PROBLEMS  UNUSUAL RASH Items with * indicate a potential emergency and should be followed up as soon as possible.  Feel free to call the clinic you have any questions or concerns. The clinic phone number is [phone].  Please show the [facility] at check-in to the Emergency Department and triage nurse.

## 2014-05-23 ENCOUNTER — Ambulatory Visit (HOSPITAL_BASED_OUTPATIENT_CLINIC_OR_DEPARTMENT_OTHER): Payer: Medicare Other

## 2014-05-23 VITALS — BP 150/73 | HR 62 | Temp 98.4°F | Resp 19

## 2014-05-23 DIAGNOSIS — Z5112 Encounter for antineoplastic immunotherapy: Secondary | ICD-10-CM

## 2014-05-23 DIAGNOSIS — C9000 Multiple myeloma not having achieved remission: Secondary | ICD-10-CM

## 2014-05-23 MED ORDER — SODIUM CHLORIDE 0.9 % IJ SOLN
10.0000 mL | INTRAMUSCULAR | Status: DC | PRN
  Administered 2014-05-23: 10 mL
  Filled 2014-05-23: qty 10

## 2014-05-23 MED ORDER — DEXTROSE 5 % IV SOLN
36.0000 mg/m2 | Freq: Once | INTRAVENOUS | Status: AC
  Administered 2014-05-23: 72 mg via INTRAVENOUS
  Filled 2014-05-23: qty 36

## 2014-05-23 MED ORDER — SODIUM CHLORIDE 0.9 % IV SOLN
Freq: Once | INTRAVENOUS | Status: AC
  Administered 2014-05-23: 10:00:00 via INTRAVENOUS

## 2014-05-23 MED ORDER — SODIUM CHLORIDE 0.9 % IV SOLN
Freq: Once | INTRAVENOUS | Status: AC
  Administered 2014-05-23: 10:00:00 via INTRAVENOUS
  Filled 2014-05-23: qty 4

## 2014-05-23 MED ORDER — HEPARIN SOD (PORK) LOCK FLUSH 100 UNIT/ML IV SOLN
500.0000 [IU] | Freq: Once | INTRAVENOUS | Status: AC | PRN
  Administered 2014-05-23: 500 [IU]
  Filled 2014-05-23: qty 5

## 2014-05-23 NOTE — Progress Notes (Signed)
Per Selena Lesser NP, okay to use PAC, pt has one more day left of antibiotics. Neg assessment of PAC, pt afebrile.

## 2014-05-23 NOTE — Patient Instructions (Signed)
[facility] Discharge Instructions for Patients Receiving Chemotherapy  Today you received the following chemotherapy agents: Kyprolis  To help prevent nausea and vomiting after your treatment, we encourage you to take your nausea medication as prescribed by your physician.   If you develop nausea and vomiting that is not controlled by your nausea medication, call the clinic.   BELOW ARE SYMPTOMS THAT SHOULD BE REPORTED IMMEDIATELY:  *FEVER GREATER THAN 100.5 F  *CHILLS WITH OR WITHOUT FEVER  NAUSEA AND VOMITING THAT IS NOT CONTROLLED WITH YOUR NAUSEA MEDICATION  *UNUSUAL SHORTNESS OF BREATH  *UNUSUAL BRUISING OR BLEEDING  TENDERNESS IN MOUTH AND THROAT WITH OR WITHOUT PRESENCE OF ULCERS  *URINARY PROBLEMS  *BOWEL PROBLEMS  UNUSUAL RASH Items with * indicate a potential emergency and should be followed up as soon as possible.  Feel free to call the clinic you have any questions or concerns. The clinic phone number is [phone].  Please show the CHEMO ALERT CARD at check-in to the Emergency Department and triage nurse.   

## 2014-05-26 ENCOUNTER — Other Ambulatory Visit: Payer: Self-pay | Admitting: Medical Oncology

## 2014-05-26 DIAGNOSIS — C9000 Multiple myeloma not having achieved remission: Secondary | ICD-10-CM

## 2014-05-26 MED ORDER — POMALIDOMIDE 4 MG PO CAPS: 4.0000 mg | ORAL_CAPSULE | Freq: Every day | ORAL | Status: DC

## 2014-05-29 ENCOUNTER — Other Ambulatory Visit (HOSPITAL_BASED_OUTPATIENT_CLINIC_OR_DEPARTMENT_OTHER): Payer: Medicare Other

## 2014-05-29 DIAGNOSIS — C9000 Multiple myeloma not having achieved remission: Secondary | ICD-10-CM

## 2014-05-29 LAB — CBC WITH DIFFERENTIAL/PLATELET
BASO%: 0 % (ref 0.0–2.0)
Basophils Absolute: 0 10*3/uL (ref 0.0–0.1)
EOS%: 8.6 % — AB (ref 0.0–7.0)
Eosinophils Absolute: 0.2 10*3/uL (ref 0.0–0.5)
HEMATOCRIT: 27.2 % — AB (ref 38.4–49.9)
HEMOGLOBIN: 8.5 g/dL — AB (ref 13.0–17.1)
LYMPH#: 0.6 10*3/uL — AB (ref 0.9–3.3)
LYMPH%: 25.9 % (ref 14.0–49.0)
MCH: 24.4 pg — AB (ref 27.2–33.4)
MCHC: 31.3 g/dL — AB (ref 32.0–36.0)
MCV: 77.9 fL — ABNORMAL LOW (ref 79.3–98.0)
MONO#: 0.5 10*3/uL (ref 0.1–0.9)
MONO%: 19.8 % — ABNORMAL HIGH (ref 0.0–14.0)
NEUT#: 1.1 10*3/uL — ABNORMAL LOW (ref 1.5–6.5)
NEUT%: 45.7 % (ref 39.0–75.0)
PLATELETS: 113 10*3/uL — AB (ref 140–400)
RBC: 3.49 10*6/uL — ABNORMAL LOW (ref 4.20–5.82)
RDW: 16.8 % — ABNORMAL HIGH (ref 11.0–14.6)
WBC: 2.4 10*3/uL — AB (ref 4.0–10.3)
nRBC: 0 % (ref 0–0)

## 2014-05-29 LAB — COMPREHENSIVE METABOLIC PANEL (CC13)
ALT: 13 U/L (ref 0–55)
ANION GAP: 9 meq/L (ref 3–11)
AST: 9 U/L (ref 5–34)
Albumin: 3.5 g/dL (ref 3.5–5.0)
Alkaline Phosphatase: 44 U/L (ref 40–150)
BILIRUBIN TOTAL: 1.79 mg/dL — AB (ref 0.20–1.20)
BUN: 19.2 mg/dL (ref 7.0–26.0)
CALCIUM: 8.6 mg/dL (ref 8.4–10.4)
CHLORIDE: 106 meq/L (ref 98–109)
CO2: 23 mEq/L (ref 22–29)
Creatinine: 1 mg/dL (ref 0.7–1.3)
EGFR: 90 mL/min/{1.73_m2} — ABNORMAL LOW (ref >90)
Glucose: 146 mg/dl — ABNORMAL HIGH (ref 70–140)
Potassium: 4.4 mEq/L (ref 3.5–5.1)
Sodium: 139 mEq/L (ref 136–145)
Total Protein: 5.3 g/dL — ABNORMAL LOW (ref 6.4–8.3)

## 2014-06-05 ENCOUNTER — Other Ambulatory Visit (HOSPITAL_BASED_OUTPATIENT_CLINIC_OR_DEPARTMENT_OTHER): Payer: Medicare Other

## 2014-06-05 ENCOUNTER — Encounter: Payer: Self-pay | Admitting: Physician Assistant

## 2014-06-05 ENCOUNTER — Telehealth: Payer: Self-pay | Admitting: *Deleted

## 2014-06-05 ENCOUNTER — Ambulatory Visit (HOSPITAL_BASED_OUTPATIENT_CLINIC_OR_DEPARTMENT_OTHER): Payer: Medicare Other

## 2014-06-05 ENCOUNTER — Ambulatory Visit (HOSPITAL_BASED_OUTPATIENT_CLINIC_OR_DEPARTMENT_OTHER): Payer: Medicare Other | Admitting: Physician Assistant

## 2014-06-05 ENCOUNTER — Telehealth: Payer: Self-pay | Admitting: Physician Assistant

## 2014-06-05 VITALS — BP 132/67 | HR 63 | Temp 98.0°F | Resp 18 | Ht 71.0 in | Wt 174.8 lb

## 2014-06-05 DIAGNOSIS — C9000 Multiple myeloma not having achieved remission: Secondary | ICD-10-CM

## 2014-06-05 DIAGNOSIS — Z5112 Encounter for antineoplastic immunotherapy: Secondary | ICD-10-CM

## 2014-06-05 DIAGNOSIS — D6481 Anemia due to antineoplastic chemotherapy: Secondary | ICD-10-CM

## 2014-06-05 LAB — CBC WITH DIFFERENTIAL/PLATELET
BASO%: 1 % (ref 0.0–2.0)
Basophils Absolute: 0 10*3/uL (ref 0.0–0.1)
EOS ABS: 0 10*3/uL (ref 0.0–0.5)
EOS%: 1.3 % (ref 0.0–7.0)
HCT: 27.8 % — ABNORMAL LOW (ref 38.4–49.9)
HGB: 8.6 g/dL — ABNORMAL LOW (ref 13.0–17.1)
LYMPH%: 22.2 % (ref 14.0–49.0)
MCH: 25 pg — AB (ref 27.2–33.4)
MCHC: 30.9 g/dL — ABNORMAL LOW (ref 32.0–36.0)
MCV: 80.8 fL (ref 79.3–98.0)
MONO#: 0.9 10*3/uL (ref 0.1–0.9)
MONO%: 28.1 % — AB (ref 0.0–14.0)
NEUT#: 1.5 10*3/uL (ref 1.5–6.5)
NEUT%: 47.4 % (ref 39.0–75.0)
PLATELETS: 211 10*3/uL (ref 140–400)
RBC: 3.44 10*6/uL — ABNORMAL LOW (ref 4.20–5.82)
RDW: 17.6 % — AB (ref 11.0–14.6)
WBC: 3.1 10*3/uL — ABNORMAL LOW (ref 4.0–10.3)
lymph#: 0.7 10*3/uL — ABNORMAL LOW (ref 0.9–3.3)

## 2014-06-05 LAB — COMPREHENSIVE METABOLIC PANEL (CC13)
ALK PHOS: 49 U/L (ref 40–150)
ALT: 13 U/L (ref 0–55)
ANION GAP: 10 meq/L (ref 3–11)
AST: 14 U/L (ref 5–34)
Albumin: 3.8 g/dL (ref 3.5–5.0)
BILIRUBIN TOTAL: 1.87 mg/dL — AB (ref 0.20–1.20)
BUN: 13.7 mg/dL (ref 7.0–26.0)
CO2: 23 meq/L (ref 22–29)
CREATININE: 1 mg/dL (ref 0.7–1.3)
Calcium: 8.8 mg/dL (ref 8.4–10.4)
Chloride: 106 mEq/L (ref 98–109)
Glucose: 122 mg/dl (ref 70–140)
Potassium: 4.1 mEq/L (ref 3.5–5.1)
SODIUM: 139 meq/L (ref 136–145)
TOTAL PROTEIN: 5.7 g/dL — AB (ref 6.4–8.3)

## 2014-06-05 MED ORDER — SODIUM CHLORIDE 0.9 % IV SOLN
Freq: Once | INTRAVENOUS | Status: AC
  Administered 2014-06-05: 11:00:00 via INTRAVENOUS

## 2014-06-05 MED ORDER — SODIUM CHLORIDE 0.9 % IV SOLN
Freq: Once | INTRAVENOUS | Status: AC
  Administered 2014-06-05: 12:00:00 via INTRAVENOUS
  Filled 2014-06-05: qty 4

## 2014-06-05 MED ORDER — SODIUM CHLORIDE 0.9 % IV SOLN: Freq: Once | INTRAVENOUS | Status: DC

## 2014-06-05 MED ORDER — DEXTROSE 5 % IV SOLN
36.0000 mg/m2 | Freq: Once | INTRAVENOUS | Status: AC
  Administered 2014-06-05: 72 mg via INTRAVENOUS
  Filled 2014-06-05: qty 36

## 2014-06-05 MED ORDER — SODIUM CHLORIDE 0.9 % IJ SOLN
10.0000 mL | INTRAMUSCULAR | Status: DC | PRN
  Administered 2014-06-05: 10 mL
  Filled 2014-06-05: qty 10

## 2014-06-05 MED ORDER — HEPARIN SOD (PORK) LOCK FLUSH 100 UNIT/ML IV SOLN
500.0000 [IU] | Freq: Once | INTRAVENOUS | Status: AC | PRN
  Administered 2014-06-05: 500 [IU]
  Filled 2014-06-05: qty 5

## 2014-06-05 NOTE — Progress Notes (Signed)
[place] [phone]   [phone]  OFFICE PROGRESS NOTE  [name] [address] 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   PRIOR THERAPY: :  1. Status post 6 cycles of systemic chemotherapy with Revlimid and Decadron, last dose was given 07/21/2010 with very good response. 2. 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 [name].  3. maintenance Revlimid at 10 mg by mouth daily status post 2 months. Therapy began 01/18/2011. 4. maintenance Revlimid at 15 mg by mouth daily with prophylactic dose Coumadin at 2 mg by mouth daily. 5. Systemic chemotherapy with Velcade at 1.3 mg per meter squared given on days 1, 4, 8 and 11 and Doxil at 30 mg per meter square given on day 4 and Decadron 40 mg by mouth on weekly basis given every 3 weeks. Status post 4 cycles. 6. Zometa 4 mg IV every 4 weeks.  7. Velcade 1.3 mg/M2 subcutaneous daily on a weekly basis with Decadron 20 mg by mouth on a weekly basis. First cycle expected on 06/21/2012. S/P 4 cycles. 8. Systemic chemotherapy with Carfilzomib, cyclophosphamide and Decadron. First cycle started on 08/09/2012. He is status post 19 cycles.   CURRENT THERAPY:  1) Systemic chemotherapy with Carfilzomib 36 MG/M2 on days 1, 2, 8, 9, 15 and 16 every 4 weeks, Pomalyst 4 mg by mouth daily for 21 days every 4 weeks and Decadron 40 mg by mouth weekly. First cycle started on 04/10/2014. Status post 2 cycles  2) Zometa every 8 weeks.    INTERVAL HISTORY: Eddie Thomas 69 y.o. male returns to the clinic today for followup visit. He is currently on treatment with Carfilzomib, Pomalyst and dexamethasone status post 2 cycles and is tolerating his treatment fairly well. He was recently treated for cellulitis involving the right chest area around his Port-A-Cath. He completed his  course of Keflex approximately one week ago and notes decreased tenderness and resolution of the erythema. He reports no difficulty with flushing or blood return to his knowledge. He denied fever or chills. He voiced no other specific complaints today.   MEDICAL HISTORY: Past Medical History  Diagnosis Date  . Multiple myeloma   . Diabetes mellitus 07/03/2011  . Hypertension 07/03/2011  . Hyperlipidemia 07/03/2011  . Colon polyps 2012  . Gastric ulcer     ALLERGIES:  has No Known Allergies.  MEDICATIONS:  Current Outpatient Prescriptions  Medication Sig Dispense Refill  . Cholecalciferol (VITAMIN D-3) 5000 UNITS TABS Take 1 tablet by mouth daily.    [name] dexamethasone (DECADRON) 4 MG tablet TAKE FIVE TABLETS BY MOUTH EVERY WEEK START WITH THE FIRST DOSE OF CHEMOTHERAPY 80 tablet 0  . lidocaine-prilocaine (EMLA) cream APPLY  CREAM TOPICALLY TO AFFECTED AREA AS DIRECTED 10 g 0  . lisinopril (PRINIVIL,ZESTRIL) 10 MG tablet Take 5 mg by mouth every evening.     . pantoprazole (PROTONIX) 40 MG tablet Take 1 tablet (40 mg total) by mouth 2 (two) times daily. 60 tablet 11  . pioglitazone (ACTOS) 15 MG tablet Take 15 mg by mouth daily.    . pomalidomide (POMALYST) 4 MG capsule Take 1 capsule (4 mg total) by mouth daily. Take with water on days 1-21. Repeat every 28 days. 21 capsule 0  . simvastatin (ZOCOR) 10 MG tablet Take 10 mg by mouth at bedtime.      [name]  valACYclovir (VALTREX) 500 MG tablet TAKE ONE TABLET BY MOUTH ONCE DAILY 30 tablet 3  . oxyCODONE-acetaminophen (PERCOCET/ROXICET) 5-325 MG per tablet Take 1 tablet by mouth every 6 (six) hours as needed for severe pain. (Patient not taking: Reported on 05/19/2014) 30 tablet 0  . prochlorperazine (COMPAZINE) 10 MG tablet Take 10 mg by mouth every 6 (six) hours as needed for nausea or vomiting.    . temazepam (RESTORIL) 15 MG capsule Take 15 mg by mouth at bedtime as needed for sleep.    [name] VIAGRA 50 MG tablet Take 50 mg by mouth daily as needed for  erectile dysfunction.      No current facility-administered medications for this visit.    SURGICAL HISTORY:  Past Surgical History  Procedure Laterality Date  . Limbal stem cell transplant    . Humerus fracture surgery      right  . 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    REVIEW OF SYSTEMS:  Constitutional: positive for fatigue Eyes: negative Ears, nose, mouth, throat, and face: negative Respiratory: negative Cardiovascular: negative Gastrointestinal: negative Genitourinary:negative Integument/breast: positive for Resolution of the erythema around the Port-A-Cath however mild tenderness to touch and remains Hematologic/lymphatic: negative Musculoskeletal:negative Neurological: negative Behavioral/Psych: negative Endocrine: negative Allergic/Immunologic: negative   PHYSICAL EXAMINATION: General appearance: alert, cooperative and no distress Head: Normocephalic, without obvious abnormality, atraumatic Neck: no adenopathy, no JVD, supple, symmetrical, trachea midline and thyroid not enlarged, symmetric, no tenderness/mass/nodules Lymph nodes: Cervical, supraclavicular, and axillary nodes normal. Resp: clear to auscultation bilaterally Back: symmetric, no curvature. ROM normal. No CVA tenderness. Cardio: regular rate and rhythm, S1, S2 normal, no murmur, click, rub or gallop GI: soft, non-tender; bowel sounds normal; no masses,  no organomegaly Extremities: extremities normal, atraumatic, no cyanosis or edema Neurologic: Alert and oriented X 3, normal strength and tone. Normal symmetric reflexes. Normal coordination and gait Right anterior chest Port-A-Cath: No erythema minimal tenderness to palpation of the Port-A-Cath and surrounding tissue. No evidence of infection  ECOG PERFORMANCE STATUS: 1 - Symptomatic but completely ambulatory  Blood pressure 132/67, pulse 63, temperature 98 F (36.7 C), temperature source Oral, resp. rate 18, height 5' 11"   (1.803 m), weight 174 lb 12.8 oz (79.289 kg), SpO2 100 %.  LABORATORY DATA: Lab Results  Component Value Date   WBC 3.1* 06/05/2014   HGB 8.6* 06/05/2014   HCT 27.8* 06/05/2014   MCV 80.8 06/05/2014   PLT 211 06/05/2014      Chemistry      Component Value Date/Time   NA 139 06/05/2014 0914   NA 139 08/30/2013 0935   K 4.1 06/05/2014 0914   K 4.1 08/30/2013 0935   CL 101 08/30/2013 0935   CL 103 07/12/2012 0907   CO2 23 06/05/2014 0914   CO2 26 08/30/2013 0935   BUN 13.7 06/05/2014 0914   BUN 11 08/30/2013 0935   CREATININE 1.0 06/05/2014 0914   CREATININE 1.03 08/30/2013 0935      Component Value Date/Time   CALCIUM 8.8 06/05/2014 0914   CALCIUM 9.2 08/30/2013 0935   ALKPHOS 49 06/05/2014 0914   ALKPHOS 39 08/30/2013 0935   AST 14 06/05/2014 0914   AST 13 08/30/2013 0935   ALT 13 06/05/2014 0914   ALT 9 08/30/2013 0935   BILITOT 1.87* 06/05/2014 0914   BILITOT 1.3* 08/30/2013 0935     Other lab results:   RADIOGRAPHIC STUDIES: No results found.  ASSESSMENT AND PLAN: This is a very pleasant  69 year old [place] American male with history of multiple myeloma currently undergoing systemic chemotherapy with Carfilzomib, cyclophosphamide and Decadron status post 19 cycles. This was discontinued secondary to disease progression. The patient has a started on systemic chemotherapy with Carfilzomib, Pomalyst and Decadron status half of cycle 2. He is tolerating his treatment fairly well. Treatment related anemia continues, but patient is asymptomatic. Hgbtoday was 8.6.  Bilirubin is elevated to  1.87. Will continue to monitor this value. No abdominal symptoms  patient discussed with and also seen by Dr. Julien Nordmann. He will proceed with day 1 of cycle 3 today as scheduled. He will continue with weekly labs as scheduled. He will follow-up in one month with repeat protein studies to reevaluate his disease.  He will next be due for zometa on 07/03/14.   He was advised to call  immediately if he has any concerning symptoms in the interval.  The patient voices understanding of current disease status and treatment options and is in agreement with the current care plan. All questions were answered. The patient knows to call the clinic with any problems, questions or concerns. We can certainly see the patient much sooner if necessary.  Carlton Adam, PA-C 06/05/2014

## 2014-06-05 NOTE — Telephone Encounter (Signed)
Pt confirmed labs/ov per 05/16 POF, gave pt AVS and Calendar.... KJ, sent msg to add chemo

## 2014-06-05 NOTE — Patient Instructions (Signed)
[facility] Center Discharge Instructions for Patients Receiving Chemotherapy  Today you received the following chemotherapy agents Kyprolis.  To help prevent nausea and vomiting after your treatment, we encourage you to take your nausea medication as prescribed.   If you develop nausea and vomiting that is not controlled by your nausea medication, call the clinic.   BELOW ARE SYMPTOMS THAT SHOULD BE REPORTED IMMEDIATELY:  *FEVER GREATER THAN 100.5 F  *CHILLS WITH OR WITHOUT FEVER  NAUSEA AND VOMITING THAT IS NOT CONTROLLED WITH YOUR NAUSEA MEDICATION  *UNUSUAL SHORTNESS OF BREATH  *UNUSUAL BRUISING OR BLEEDING  TENDERNESS IN MOUTH AND THROAT WITH OR WITHOUT PRESENCE OF ULCERS  *URINARY PROBLEMS  *BOWEL PROBLEMS  UNUSUAL RASH Items with * indicate a potential emergency and should be followed up as soon as possible.  Feel free to call the clinic you have any questions or concerns. The clinic phone number is [phone].  Please show the CHEMO ALERT CARD at check-in to the Emergency Department and triage nurse.   

## 2014-06-05 NOTE — Progress Notes (Signed)
Patient complains of tenderness at [facility] site. Clarified with Adrena, who saw patient prior to infusion room; Okay to use port for treatment per Adrena.

## 2014-06-05 NOTE — Telephone Encounter (Signed)
Per staff message and POF I have scheduled appts. Advised scheduler of appts. JMW  

## 2014-06-06 ENCOUNTER — Ambulatory Visit (HOSPITAL_BASED_OUTPATIENT_CLINIC_OR_DEPARTMENT_OTHER): Payer: Medicare Other

## 2014-06-06 DIAGNOSIS — Z5112 Encounter for antineoplastic immunotherapy: Secondary | ICD-10-CM | POA: Diagnosis present

## 2014-06-06 DIAGNOSIS — C9000 Multiple myeloma not having achieved remission: Secondary | ICD-10-CM

## 2014-06-06 MED ORDER — DEXTROSE 5 % IV SOLN
36.0000 mg/m2 | Freq: Once | INTRAVENOUS | Status: AC
  Administered 2014-06-06: 72 mg via INTRAVENOUS
  Filled 2014-06-06: qty 36

## 2014-06-06 MED ORDER — SODIUM CHLORIDE 0.9 % IV SOLN
Freq: Once | INTRAVENOUS | Status: AC
  Administered 2014-06-06: 10:00:00 via INTRAVENOUS

## 2014-06-06 MED ORDER — HEPARIN SOD (PORK) LOCK FLUSH 100 UNIT/ML IV SOLN
500.0000 [IU] | Freq: Once | INTRAVENOUS | Status: AC | PRN
  Administered 2014-06-06: 500 [IU]
  Filled 2014-06-06: qty 5

## 2014-06-06 MED ORDER — SODIUM CHLORIDE 0.9 % IJ SOLN
10.0000 mL | INTRAMUSCULAR | Status: DC | PRN
  Administered 2014-06-06: 10 mL
  Filled 2014-06-06: qty 10

## 2014-06-06 MED ORDER — SODIUM CHLORIDE 0.9 % IV SOLN
Freq: Once | INTRAVENOUS | Status: AC
  Administered 2014-06-06: 10:00:00 via INTRAVENOUS
  Filled 2014-06-06: qty 4

## 2014-06-06 MED ORDER — SODIUM CHLORIDE 0.9 % IV SOLN: Freq: Once | INTRAVENOUS | Status: DC

## 2014-06-06 NOTE — Patient Instructions (Signed)
[facility] Discharge Instructions for Patients Receiving Chemotherapy  Today you received the following chemotherapy agents: Kyprolis   To help prevent nausea and vomiting after your treatment, we encourage you to take your nausea medication as directed.    If you develop nausea and vomiting that is not controlled by your nausea medication, call the clinic.   BELOW ARE SYMPTOMS THAT SHOULD BE REPORTED IMMEDIATELY:  *FEVER GREATER THAN 100.5 F  *CHILLS WITH OR WITHOUT FEVER  NAUSEA AND VOMITING THAT IS NOT CONTROLLED WITH YOUR NAUSEA MEDICATION  *UNUSUAL SHORTNESS OF BREATH  *UNUSUAL BRUISING OR BLEEDING  TENDERNESS IN MOUTH AND THROAT WITH OR WITHOUT PRESENCE OF ULCERS  *URINARY PROBLEMS  *BOWEL PROBLEMS  UNUSUAL RASH Items with * indicate a potential emergency and should be followed up as soon as possible.  Feel free to call the clinic you have any questions or concerns. The clinic phone number is [phone].  Please show the CHEMO ALERT CARD at check-in to the Emergency Department and triage nurse.   

## 2014-06-12 ENCOUNTER — Ambulatory Visit (HOSPITAL_BASED_OUTPATIENT_CLINIC_OR_DEPARTMENT_OTHER): Payer: Medicare Other

## 2014-06-12 ENCOUNTER — Other Ambulatory Visit (HOSPITAL_BASED_OUTPATIENT_CLINIC_OR_DEPARTMENT_OTHER): Payer: Medicare Other

## 2014-06-12 VITALS — BP 137/69 | HR 68 | Temp 97.7°F | Resp 18

## 2014-06-12 DIAGNOSIS — C9000 Multiple myeloma not having achieved remission: Secondary | ICD-10-CM | POA: Diagnosis present

## 2014-06-12 DIAGNOSIS — Z5112 Encounter for antineoplastic immunotherapy: Secondary | ICD-10-CM | POA: Diagnosis present

## 2014-06-12 LAB — COMPREHENSIVE METABOLIC PANEL (CC13)
ALK PHOS: 52 U/L (ref 40–150)
ALT: 10 U/L (ref 0–55)
ANION GAP: 11 meq/L (ref 3–11)
AST: 11 U/L (ref 5–34)
Albumin: 3.6 g/dL (ref 3.5–5.0)
BUN: 12.7 mg/dL (ref 7.0–26.0)
CO2: 22 meq/L (ref 22–29)
Calcium: 8.5 mg/dL (ref 8.4–10.4)
Chloride: 105 mEq/L (ref 98–109)
Creatinine: 0.9 mg/dL (ref 0.7–1.3)
EGFR: >90 mL/min/{1.73_m2} (ref >90)
GLUCOSE: 138 mg/dL (ref 70–140)
Potassium: 4.2 mEq/L (ref 3.5–5.1)
Sodium: 138 mEq/L (ref 136–145)
TOTAL PROTEIN: 5.9 g/dL — AB (ref 6.4–8.3)
Total Bilirubin: 2.1 mg/dL — ABNORMAL HIGH (ref 0.20–1.20)

## 2014-06-12 LAB — CBC WITH DIFFERENTIAL/PLATELET
BASO%: 0.4 % (ref 0.0–2.0)
BASOS ABS: 0 10*3/uL (ref 0.0–0.1)
EOS%: 2.8 % (ref 0.0–7.0)
Eosinophils Absolute: 0.1 10*3/uL (ref 0.0–0.5)
HEMATOCRIT: 29 % — AB (ref 38.4–49.9)
HGB: 9.2 g/dL — ABNORMAL LOW (ref 13.0–17.1)
LYMPH%: 19.4 % (ref 14.0–49.0)
MCH: 25.3 pg — ABNORMAL LOW (ref 27.2–33.4)
MCHC: 31.7 g/dL — AB (ref 32.0–36.0)
MCV: 79.8 fL (ref 79.3–98.0)
MONO#: 0.3 10*3/uL (ref 0.1–0.9)
MONO%: 9.6 % (ref 0.0–14.0)
NEUT#: 2.1 10*3/uL (ref 1.5–6.5)
NEUT%: 67.8 % (ref 39.0–75.0)
PLATELETS: 146 10*3/uL (ref 140–400)
RBC: 3.63 10*6/uL — ABNORMAL LOW (ref 4.20–5.82)
RDW: 17.9 % — AB (ref 11.0–14.6)
WBC: 3.1 10*3/uL — AB (ref 4.0–10.3)
lymph#: 0.6 10*3/uL — ABNORMAL LOW (ref 0.9–3.3)

## 2014-06-12 LAB — TECHNOLOGIST REVIEW

## 2014-06-12 MED ORDER — SODIUM CHLORIDE 0.9 % IV SOLN
Freq: Once | INTRAVENOUS | Status: AC
  Administered 2014-06-12: 10:00:00 via INTRAVENOUS

## 2014-06-12 MED ORDER — SODIUM CHLORIDE 0.9 % IV SOLN
Freq: Once | INTRAVENOUS | Status: AC
  Administered 2014-06-12: 10:00:00 via INTRAVENOUS
  Filled 2014-06-12: qty 4

## 2014-06-12 MED ORDER — CARFILZOMIB CHEMO INJECTION 60 MG
36.0000 mg/m2 | Freq: Once | INTRAVENOUS | Status: AC
  Administered 2014-06-12: 72 mg via INTRAVENOUS
  Filled 2014-06-12: qty 36

## 2014-06-12 MED ORDER — SODIUM CHLORIDE 0.9 % IJ SOLN
10.0000 mL | INTRAMUSCULAR | Status: DC | PRN
  Administered 2014-06-12: 10 mL
  Filled 2014-06-12: qty 10

## 2014-06-12 MED ORDER — HEPARIN SOD (PORK) LOCK FLUSH 100 UNIT/ML IV SOLN
500.0000 [IU] | Freq: Once | INTRAVENOUS | Status: AC | PRN
  Administered 2014-06-12: 500 [IU]
  Filled 2014-06-12: qty 5

## 2014-06-12 NOTE — Progress Notes (Signed)
Pt reports ongoing tenderness and discomfort at [facility] site.  States he was treated w/ antibiotics and it has improved some, it is not any worse.  Skin directly over PAC is discolored, darker than surrounding skin.  Pt reports tenderness on palpation and also trying to sleep on his right side at night.  No redness or swelling visible.  Pt allowed RN to access [facility] and it has excellent blood return and flushes easily.  Instructed pt to report if any burning, stinging or pain while PAC is accessed.  He verbalized understanding.    Notified Dr. Julien Nordmann of above.  Notified Dr. Julien Nordmann of Bilirubin 2.1 today.  He instructs ok to treat today and tomorrow w/ Kyprolis as ordered.  He will reassess Bili next treatment.  He instructs for pt to avoid Alcohol, Aleve, Ibuprofen.  Instructed pt to avoid alcohol aleve and ibuprofen.  He denies drinking alcohol or taking any NSAIDs.  He verbalized understanding to avoid.

## 2014-06-12 NOTE — Patient Instructions (Signed)
[facility] Discharge Instructions for Patients Receiving Chemotherapy  Today you received the following chemotherapy agents: Kyprolis   To help prevent nausea and vomiting after your treatment, we encourage you to take your nausea medication as directed.    If you develop nausea and vomiting that is not controlled by your nausea medication, call the clinic.   BELOW ARE SYMPTOMS THAT SHOULD BE REPORTED IMMEDIATELY:  *FEVER GREATER THAN 100.5 F  *CHILLS WITH OR WITHOUT FEVER  NAUSEA AND VOMITING THAT IS NOT CONTROLLED WITH YOUR NAUSEA MEDICATION  *UNUSUAL SHORTNESS OF BREATH  *UNUSUAL BRUISING OR BLEEDING  TENDERNESS IN MOUTH AND THROAT WITH OR WITHOUT PRESENCE OF ULCERS  *URINARY PROBLEMS  *BOWEL PROBLEMS  UNUSUAL RASH Items with * indicate a potential emergency and should be followed up as soon as possible.  Feel free to call the clinic you have any questions or concerns. The clinic phone number is [phone].  Please show the CHEMO ALERT CARD at check-in to the Emergency Department and triage nurse.   

## 2014-06-13 ENCOUNTER — Ambulatory Visit (HOSPITAL_BASED_OUTPATIENT_CLINIC_OR_DEPARTMENT_OTHER): Payer: Medicare Other

## 2014-06-13 VITALS — BP 155/81 | HR 60 | Temp 98.3°F | Resp 20

## 2014-06-13 DIAGNOSIS — Z5112 Encounter for antineoplastic immunotherapy: Secondary | ICD-10-CM

## 2014-06-13 DIAGNOSIS — C9000 Multiple myeloma not having achieved remission: Secondary | ICD-10-CM | POA: Diagnosis not present

## 2014-06-13 MED ORDER — HEPARIN SOD (PORK) LOCK FLUSH 100 UNIT/ML IV SOLN
500.0000 [IU] | Freq: Once | INTRAVENOUS | Status: AC | PRN
  Administered 2014-06-13: 500 [IU]
  Filled 2014-06-13: qty 5

## 2014-06-13 MED ORDER — SODIUM CHLORIDE 0.9 % IJ SOLN
10.0000 mL | INTRAMUSCULAR | Status: DC | PRN
  Administered 2014-06-13: 10 mL
  Filled 2014-06-13: qty 10

## 2014-06-13 MED ORDER — SODIUM CHLORIDE 0.9 % IV SOLN: Freq: Once | INTRAVENOUS | Status: DC

## 2014-06-13 MED ORDER — SODIUM CHLORIDE 0.9 % IV SOLN
Freq: Once | INTRAVENOUS | Status: AC
  Administered 2014-06-13: 10:00:00 via INTRAVENOUS
  Filled 2014-06-13: qty 4

## 2014-06-13 MED ORDER — DEXTROSE 5 % IV SOLN
36.0000 mg/m2 | Freq: Once | INTRAVENOUS | Status: AC
  Administered 2014-06-13: 72 mg via INTRAVENOUS
  Filled 2014-06-13: qty 36

## 2014-06-13 MED ORDER — SODIUM CHLORIDE 0.9 % IV SOLN
Freq: Once | INTRAVENOUS | Status: AC
  Administered 2014-06-13: 10:00:00 via INTRAVENOUS

## 2014-06-13 NOTE — Patient Instructions (Signed)
[facility] Discharge Instructions for Patients Receiving Chemotherapy  Today you received the following chemotherapy agents:  Kyprolis  To help prevent nausea and vomiting after your treatment, we encourage you to take your nausea medication as ordered per MD.   If you develop nausea and vomiting that is not controlled by your nausea medication, call the clinic.   BELOW ARE SYMPTOMS THAT SHOULD BE REPORTED IMMEDIATELY:  *FEVER GREATER THAN 100.5 F  *CHILLS WITH OR WITHOUT FEVER  NAUSEA AND VOMITING THAT IS NOT CONTROLLED WITH YOUR NAUSEA MEDICATION  *UNUSUAL SHORTNESS OF BREATH  *UNUSUAL BRUISING OR BLEEDING  TENDERNESS IN MOUTH AND THROAT WITH OR WITHOUT PRESENCE OF ULCERS  *URINARY PROBLEMS  *BOWEL PROBLEMS  UNUSUAL RASH Items with * indicate a potential emergency and should be followed up as soon as possible.  Feel free to call the clinic you have any questions or concerns. The clinic phone number is [phone].  Please show the CHEMO ALERT CARD at check-in to the Emergency Department and triage nurse.   

## 2014-06-13 NOTE — Progress Notes (Signed)
Pt states that tenderness to port site is unchanged from yesterday.

## 2014-06-20 ENCOUNTER — Other Ambulatory Visit (HOSPITAL_COMMUNITY): Payer: Self-pay | Admitting: Interventional Radiology

## 2014-06-20 ENCOUNTER — Ambulatory Visit (HOSPITAL_COMMUNITY)
Admission: RE | Admit: 2014-06-20 | Discharge: 2014-06-20 | Disposition: A | Discharge status: Home / Self Care | Payer: Medicare Other | Source: Ambulatory Visit | Attending: Interventional Radiology | Admitting: Interventional Radiology

## 2014-06-20 ENCOUNTER — Ambulatory Visit (HOSPITAL_BASED_OUTPATIENT_CLINIC_OR_DEPARTMENT_OTHER): Payer: Medicare Other

## 2014-06-20 ENCOUNTER — Encounter (HOSPITAL_COMMUNITY): Payer: Self-pay

## 2014-06-20 ENCOUNTER — Other Ambulatory Visit (HOSPITAL_BASED_OUTPATIENT_CLINIC_OR_DEPARTMENT_OTHER): Payer: Medicare Other

## 2014-06-20 ENCOUNTER — Ambulatory Visit (HOSPITAL_BASED_OUTPATIENT_CLINIC_OR_DEPARTMENT_OTHER): Payer: Medicare Other | Admitting: Nurse Practitioner

## 2014-06-20 VITALS — BP 134/60 | HR 62 | Temp 98.4°F | Resp 16

## 2014-06-20 DIAGNOSIS — C9000 Multiple myeloma not having achieved remission: Secondary | ICD-10-CM

## 2014-06-20 DIAGNOSIS — D6481 Anemia due to antineoplastic chemotherapy: Secondary | ICD-10-CM

## 2014-06-20 DIAGNOSIS — E785 Hyperlipidemia, unspecified: Secondary | ICD-10-CM | POA: Insufficient documentation

## 2014-06-20 DIAGNOSIS — T80212A Local infection due to central venous catheter, initial encounter: Secondary | ICD-10-CM | POA: Diagnosis present

## 2014-06-20 DIAGNOSIS — Z95828 Presence of other vascular implants and grafts: Secondary | ICD-10-CM

## 2014-06-20 DIAGNOSIS — T451X5A Adverse effect of antineoplastic and immunosuppressive drugs, initial encounter: Secondary | ICD-10-CM

## 2014-06-20 DIAGNOSIS — Z5112 Encounter for antineoplastic immunotherapy: Secondary | ICD-10-CM | POA: Diagnosis present

## 2014-06-20 DIAGNOSIS — Z86010 Personal history of colonic polyps: Secondary | ICD-10-CM | POA: Insufficient documentation

## 2014-06-20 DIAGNOSIS — Y831 Surgical operation with implant of artificial internal device as the cause of abnormal reaction of the patient, or of later complication, without mention of misadventure at the time of the procedure: Secondary | ICD-10-CM | POA: Insufficient documentation

## 2014-06-20 DIAGNOSIS — T80219A Unspecified infection due to central venous catheter, initial encounter: Secondary | ICD-10-CM

## 2014-06-20 DIAGNOSIS — E119 Type 2 diabetes mellitus without complications: Secondary | ICD-10-CM | POA: Diagnosis not present

## 2014-06-20 DIAGNOSIS — T798XXA Other early complications of trauma, initial encounter: Secondary | ICD-10-CM

## 2014-06-20 DIAGNOSIS — I10 Essential (primary) hypertension: Secondary | ICD-10-CM | POA: Insufficient documentation

## 2014-06-20 LAB — COMPREHENSIVE METABOLIC PANEL (CC13)
ALT: 14 U/L (ref 0–55)
ANION GAP: 11 meq/L (ref 3–11)
AST: 13 U/L (ref 5–34)
Albumin: 3.6 g/dL (ref 3.5–5.0)
Alkaline Phosphatase: 50 U/L (ref 40–150)
BUN: 12.2 mg/dL (ref 7.0–26.0)
CO2: 23 mEq/L (ref 22–29)
CREATININE: 1 mg/dL (ref 0.7–1.3)
Calcium: 8.9 mg/dL (ref 8.4–10.4)
Chloride: 104 mEq/L (ref 98–109)
EGFR: >90 mL/min/{1.73_m2} (ref >90)
Glucose: 155 mg/dl — ABNORMAL HIGH (ref 70–140)
Potassium: 4.4 mEq/L (ref 3.5–5.1)
Sodium: 138 mEq/L (ref 136–145)
Total Bilirubin: 1.83 mg/dL — ABNORMAL HIGH (ref 0.20–1.20)
Total Protein: 5.8 g/dL — ABNORMAL LOW (ref 6.4–8.3)

## 2014-06-20 LAB — CBC WITH DIFFERENTIAL/PLATELET
BASO%: 1.4 % (ref 0.0–2.0)
Basophils Absolute: 0.1 10*3/uL (ref 0.0–0.1)
EOS%: 6.3 % (ref 0.0–7.0)
Eosinophils Absolute: 0.2 10*3/uL (ref 0.0–0.5)
HEMATOCRIT: 28.4 % — AB (ref 38.4–49.9)
HGB: 9 g/dL — ABNORMAL LOW (ref 13.0–17.1)
LYMPH%: 19.3 % (ref 14.0–49.0)
MCH: 25.1 pg — ABNORMAL LOW (ref 27.2–33.4)
MCHC: 31.6 g/dL — AB (ref 32.0–36.0)
MCV: 79.3 fL (ref 79.3–98.0)
MONO#: 0.6 10*3/uL (ref 0.1–0.9)
MONO%: 16.9 % — AB (ref 0.0–14.0)
NEUT%: 56.1 % (ref 39.0–75.0)
NEUTROS ABS: 2.1 10*3/uL (ref 1.5–6.5)
Platelets: 170 10*3/uL (ref 140–400)
RBC: 3.58 10*6/uL — AB (ref 4.20–5.82)
RDW: 17.5 % — ABNORMAL HIGH (ref 11.0–14.6)
WBC: 3.8 10*3/uL — AB (ref 4.0–10.3)
lymph#: 0.7 10*3/uL — ABNORMAL LOW (ref 0.9–3.3)

## 2014-06-20 LAB — LACTATE DEHYDROGENASE (CC13): LDH: 196 U/L (ref 125–245)

## 2014-06-20 MED ORDER — CEFAZOLIN SODIUM-DEXTROSE 2-3 GM-% IV SOLR
2.0000 g | INTRAVENOUS | Status: AC
  Administered 2014-06-20: 15:00:00 via INTRAVENOUS

## 2014-06-20 MED ORDER — CEFAZOLIN SODIUM-DEXTROSE 2-3 GM-% IV SOLR
INTRAVENOUS | Status: AC
  Filled 2014-06-20: qty 50

## 2014-06-20 MED ORDER — SODIUM CHLORIDE 0.9 % IV SOLN
Freq: Once | INTRAVENOUS | Status: AC
  Administered 2014-06-20: 10:00:00 via INTRAVENOUS

## 2014-06-20 MED ORDER — SODIUM CHLORIDE 0.9 % IV SOLN
INTRAVENOUS | Status: DC
  Administered 2014-06-20: 15:00:00 via INTRAVENOUS

## 2014-06-20 MED ORDER — DEXTROSE 5 % IV SOLN
36.0000 mg/m2 | Freq: Once | INTRAVENOUS | Status: AC
  Administered 2014-06-20: 72 mg via INTRAVENOUS
  Filled 2014-06-20: qty 36

## 2014-06-20 MED ORDER — DEXAMETHASONE SODIUM PHOSPHATE 100 MG/10ML IJ SOLN
Freq: Once | INTRAMUSCULAR | Status: AC
  Administered 2014-06-20: 10:00:00 via INTRAVENOUS
  Filled 2014-06-20: qty 4

## 2014-06-20 MED ORDER — LIDOCAINE HCL 1 % IJ SOLN
INTRAMUSCULAR | Status: AC
  Filled 2014-06-20: qty 20

## 2014-06-20 NOTE — H&P (Signed)
Chief Complaint: Tenderness at [facility] A Cath  Referring Physician(s): Watts,John  History of Present Illness: Eddie Thomas is a 69 y.o. male who c/o  Tenderness around the port a cath site.  He states it has been tender for about 2 weeks.  He was on PO antibiotics and finished them.  He states it has not really gotten any better.  His last chemo treatment was today and it was done peripherally , not via the [place].    Past Medical History  Diagnosis Date  . Multiple myeloma   . Diabetes mellitus 07/03/2011  . Hypertension 07/03/2011  . Hyperlipidemia 07/03/2011  . Colon polyps 2012  . Gastric ulcer     Past Surgical History  Procedure Laterality Date  . Limbal stem cell transplant    . Humerus fracture surgery      right  . 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    Allergies: Review of patient's allergies indicates no known allergies.  Medications: Prior to Admission medications   Medication Sig Start Date End Date Taking? Authorizing Provider  Cholecalciferol (VITAMIN D-3) 5000 UNITS TABS Take 1 tablet by mouth daily.    Historical Provider, MD  dexamethasone (DECADRON) 4 MG tablet TAKE FIVE TABLETS BY MOUTH EVERY WEEK START WITH THE FIRST DOSE OF CHEMOTHERAPY 05/15/14   Carlton Adam, PA-C  lidocaine-prilocaine (EMLA) cream APPLY  CREAM TOPICALLY TO AFFECTED AREA AS DIRECTED 05/15/14   Curt Bears, MD  lisinopril (PRINIVIL,ZESTRIL) 10 MG tablet Take 5 mg by mouth every evening.     Historical Provider, MD  oxyCODONE-acetaminophen (PERCOCET/ROXICET) 5-325 MG per tablet Take 1 tablet by mouth every 6 (six) hours as needed for severe pain. Patient not taking: Reported on 05/19/2014 07/21/13   Curt Bears, MD  pantoprazole (PROTONIX) 40 MG tablet Take 1 tablet (40 mg total) by mouth 2 (two) times daily. 09/22/13   Ladene Artist, MD  pioglitazone (ACTOS) 15 MG tablet Take 15 mg by mouth daily. 08/19/12   Historical Provider, MD  pomalidomide  (POMALYST) 4 MG capsule Take 1 capsule (4 mg total) by mouth daily. Take with water on days 1-21. Repeat every 28 days. 05/26/14   Curt Bears, MD  prochlorperazine (COMPAZINE) 10 MG tablet Take 10 mg by mouth every 6 (six) hours as needed for nausea or vomiting.    Historical Provider, MD  simvastatin (ZOCOR) 10 MG tablet Take 10 mg by mouth at bedtime.      Historical Provider, MD  temazepam (RESTORIL) 15 MG capsule Take 15 mg by mouth at bedtime as needed for sleep.    Historical Provider, MD  valACYclovir (VALTREX) 500 MG tablet TAKE ONE TABLET BY MOUTH ONCE DAILY 03/14/14   Adrena E Johnson, PA-C  VIAGRA 50 MG tablet Take 50 mg by mouth daily as needed for erectile dysfunction.  02/23/13   Historical Provider, MD     Family History  Problem Relation Age of Onset  . Diabetes Mother   . Hyperlipidemia Mother   . Diabetes Sister   . Diabetes Brother   . Colon cancer Maternal Uncle     History   Social History  . Marital Status: Married    Spouse Name: N/A  . Number of Children: 2  . Years of Education: N/A   Occupational History  . retired    Social History Main Topics  . Smoking status: Never Smoker   . Smokeless tobacco: Never Used  . Alcohol  Use: No  . Drug Use: No  . Sexual Activity: Not on file   Other Topics Concern  . Not on file   Social History Narrative     Review of Systems: A 12 point ROS discussed and pertinent positives are indicated in the HPI above.  All other systems are negative.  Review of Systems  Constitutional: Negative for fever, chills, activity change and appetite change.  Respiratory: Negative for chest tightness and shortness of breath.   Cardiovascular: Negative for chest pain.  Gastrointestinal: Negative for abdominal pain.  Musculoskeletal: Negative.   Skin: Negative.   Neurological: Negative.   Psychiatric/Behavioral: Negative.     Vital Signs: There were no vitals taken for this visit.  Physical Exam  Constitutional: He is  oriented to person, place, and time. He appears well-developed.  HENT:  Head: Normocephalic and atraumatic.  Eyes: EOM are normal.  Neck: Normal range of motion. Neck supple.  Cardiovascular: Normal rate, regular rhythm and normal heart sounds.   Pulmonary/Chest: Effort normal and breath sounds normal.  Abdominal: Soft. Bowel sounds are normal.  Musculoskeletal: Normal range of motion.  Neurological: He is alert and oriented to person, place, and time.  Skin:  Right chest port area with moderate erythema, + fluctuance, very tender to palpation.  Small amount of purulent material was able to be expressed and this was sent for Cultures.     Imaging: No results found.  Labs:  CBC:  Recent Labs  05/29/14 0936 06/05/14 0914 06/12/14 0904 06/20/14 0920  WBC 2.4* 3.1* 3.1* 3.8*  HGB 8.5* 8.6* 9.2* 9.0*  HCT 27.2* 27.8* 29.0* 28.4*  PLT 113* 211 146 170    COAGS: No results for input(s): INR, APTT in the last 8760 hours.  BMP:  Recent Labs  08/30/13 0935  05/29/14 0936 06/05/14 0914 06/12/14 0904 06/20/14 0920  NA 139  < > 139 139 138 138  K 4.1  < > 4.4 4.1 4.2 4.4  CL 101  --   --   --   --   --   CO2 26  < > 23 23 22 23   GLUCOSE 154*  < > 146* 122 138 155*  BUN 11  < > 19.2 13.7 12.7 12.2  CALCIUM 9.2  < > 8.6 8.8 8.5 8.9  CREATININE 1.03  < > 1.0 1.0 0.9 1.0  < > = values in this interval not displayed.  LIVER FUNCTION TESTS:  Recent Labs  05/29/14 0936 06/05/14 0914 06/12/14 0904 06/20/14 0920  BILITOT 1.79* 1.87* 2.10* 1.83*  AST 9 14 11 13   ALT 13 13 10 14   ALKPHOS 44 49 52 50  PROT 5.3* 5.7* 5.9* 5.8*  ALBUMIN 3.5 3.8 3.6 3.6    TUMOR MARKERS: No results for input(s): AFPTM, CEA, CA199, CHROMGRNA in the last 8760 hours.  Assessment and Plan:  Infected Port A Cath  For removal of Port today by Dr. Vernard Gambles  Will give him Rx for Doxycycline 100 mg PO BID x 10 days  Return next week for recheck of the site.  Risks and Benefits discussed  with the patient including, but not limited to bleeding, infection, pneumothorax, or fibrin sheath development and need for additional procedures. All of the patient's questions were answered, patient is agreeable to proceed. Consent signed and in chart.   Thank you for this interesting consult.  I greatly enjoyed meeting Eddie Thomas and look forward to participating in their care.  SignedNevin Bloodgood 06/20/2014, 2:05  PM   I spent a total of   15 Minutes in face to face in clinical consultation, greater than 50% of which was counseling/coordinating care for infected port a cath.

## 2014-06-20 NOTE — Progress Notes (Signed)
Sugar is 187

## 2014-06-20 NOTE — BH Specialist Note (Signed)
Pt was d/c from IR post procedure, did not come back to short stay.

## 2014-06-20 NOTE — Progress Notes (Signed)
Pt seen in infusion room by C. Berniece Salines, NP for port tenderness.  PAC tender and edematous occasionally per pt.  PAC has been used with tenderness in the past per pt.  Per C. Berniece Salines, NP chemo to be given peripherally today, pt to start course of oral abx.  Pt verbalized understanding.

## 2014-06-20 NOTE — Patient Instructions (Signed)
[facility] Discharge Instructions for Patients Receiving Chemotherapy  Today you received the following chemotherapy agents: kyprolis  To help prevent nausea and vomiting after your treatment, we encourage you to take your nausea medication as directed.  If you develop nausea and vomiting that is not controlled by your nausea medication, call the clinic.   BELOW ARE SYMPTOMS THAT SHOULD BE REPORTED IMMEDIATELY:  *FEVER GREATER THAN 100.5 F  *CHILLS WITH OR WITHOUT FEVER  NAUSEA AND VOMITING THAT IS NOT CONTROLLED WITH YOUR NAUSEA MEDICATION  *UNUSUAL SHORTNESS OF BREATH  *UNUSUAL BRUISING OR BLEEDING  TENDERNESS IN MOUTH AND THROAT WITH OR WITHOUT PRESENCE OF ULCERS  *URINARY PROBLEMS  *BOWEL PROBLEMS  UNUSUAL RASH Items with * indicate a potential emergency and should be followed up as soon as possible.  Feel free to call the clinic you have any questions or concerns. The clinic phone number is [phone].  Please show the CHEMO ALERT CARD at check-in to the Emergency Department and triage nurse.   

## 2014-06-20 NOTE — Procedures (Signed)
Removal R IJ port. Pocket filled with iodoform gauze, covered with sterile gauze. F/U Monday for dressing change. Doxy PO for now til cultures and sensitivity back. No complication No blood loss. See complete dictation in [facility].

## 2014-06-21 ENCOUNTER — Ambulatory Visit (HOSPITAL_BASED_OUTPATIENT_CLINIC_OR_DEPARTMENT_OTHER): Payer: Medicare Other

## 2014-06-21 ENCOUNTER — Telehealth: Payer: Self-pay | Admitting: *Deleted

## 2014-06-21 ENCOUNTER — Encounter: Payer: Self-pay | Admitting: Nurse Practitioner

## 2014-06-21 VITALS — BP 142/72 | HR 60 | Temp 97.1°F | Resp 18

## 2014-06-21 DIAGNOSIS — Z5112 Encounter for antineoplastic immunotherapy: Secondary | ICD-10-CM | POA: Diagnosis present

## 2014-06-21 DIAGNOSIS — T80219A Unspecified infection due to central venous catheter, initial encounter: Secondary | ICD-10-CM | POA: Insufficient documentation

## 2014-06-21 DIAGNOSIS — C9000 Multiple myeloma not having achieved remission: Secondary | ICD-10-CM | POA: Diagnosis not present

## 2014-06-21 DIAGNOSIS — D6481 Anemia due to antineoplastic chemotherapy: Secondary | ICD-10-CM | POA: Insufficient documentation

## 2014-06-21 DIAGNOSIS — T451X5A Adverse effect of antineoplastic and immunosuppressive drugs, initial encounter: Secondary | ICD-10-CM

## 2014-06-21 LAB — GLUCOSE, CAPILLARY: Glucose-Capillary: 187 mg/dL — ABNORMAL HIGH (ref 65–99)

## 2014-06-21 MED ORDER — CARFILZOMIB CHEMO INJECTION 60 MG
36.0000 mg/m2 | Freq: Once | INTRAVENOUS | Status: AC
  Administered 2014-06-21: 72 mg via INTRAVENOUS
  Filled 2014-06-21: qty 36

## 2014-06-21 MED ORDER — SODIUM CHLORIDE 0.9 % IV SOLN
Freq: Once | INTRAVENOUS | Status: AC
  Administered 2014-06-21: 10:00:00 via INTRAVENOUS

## 2014-06-21 MED ORDER — SODIUM CHLORIDE 0.9 % IV SOLN
Freq: Once | INTRAVENOUS | Status: AC
  Administered 2014-06-21: 10:00:00 via INTRAVENOUS
  Filled 2014-06-21: qty 4

## 2014-06-21 NOTE — Assessment & Plan Note (Signed)
Patient appears to be tolerating his Kyprolis chemotherapy fairly well.  He also continues to take his oral Pomalyst as directed.  Last Zometa infusion was 05/08/2014.  His Zometa infusions are scheduled on an every eight-week basis.  His next Zometa infusion is scheduled for 07/08/2014; but may very well the given during patient's 07/03/2014 visit.  Blood count remains stable; the exception of a mild anemia with hemoglobin 9.0.  Patient is noted to have some localized erythema, mild edema, and tenderness to right upper chest wall Port-A-Cath site.  Patient will be transported to interventional radiology for further evaluation and management of Port-A-Cath.  Decision was made to proceed with chemotherapy today peripherally.  Patient is scheduled to return for cycle 3, day 16 of his chemotherapy tomorrow, 06/21/2014.  He is scheduled to return on 07/03/2014 for labs, follow up visit, and  chemotherapy.

## 2014-06-21 NOTE — Patient Instructions (Addendum)
[facility] Discharge Instructions for Patients Receiving Chemotherapy  Today you received the following chemotherapy agents:  Kyprolis  To help prevent nausea and vomiting after your treatment, we encourage you to take your nausea medication as ordered per MD.   If you develop nausea and vomiting that is not controlled by your nausea medication, call the clinic.   BELOW ARE SYMPTOMS THAT SHOULD BE REPORTED IMMEDIATELY:  *FEVER GREATER THAN 100.5 F  *CHILLS WITH OR WITHOUT FEVER  NAUSEA AND VOMITING THAT IS NOT CONTROLLED WITH YOUR NAUSEA MEDICATION  *UNUSUAL SHORTNESS OF BREATH  *UNUSUAL BRUISING OR BLEEDING  TENDERNESS IN MOUTH AND THROAT WITH OR WITHOUT PRESENCE OF ULCERS  *URINARY PROBLEMS  *BOWEL PROBLEMS  UNUSUAL RASH Items with * indicate a potential emergency and should be followed up as soon as possible.  Feel free to call the clinic you have any questions or concerns. The clinic phone number is [phone].  Please show the [facility] at check-in to the Emergency Department and triage nurse.  Carfilzomib injection What is this medicine? CARFILZOMIB (kar FILZ oh mib) is a chemotherapy drug that works by slowing or stopping cancer cell growth. This medicine is used to treat multiple myeloma. This medicine may be used for other purposes; ask your health care provider or pharmacist if you have questions. COMMON BRAND NAME(S): KYPROLIS What should I tell my health care provider before I take this medicine? They need to know if you have any of these conditions: -heart disease -irregular heartbeat -liver disease -lung or breathing disease -an unusual or allergic reaction to carfilzomib, or other medicines, foods, dyes, or preservatives -pregnant or trying to get pregnant -breast-feeding How should I use this medicine? This medicine is for injection or infusion into a vein. It is given by a health care professional in a hospital or clinic  setting. Talk to your pediatrician regarding the use of this medicine in children. Special care may be needed. Overdosage: If you think you've taken too much of this medicine contact a poison control center or emergency room at once. Overdosage: If you think you have taken too much of this medicine contact a poison control center or emergency room at once. NOTE: This medicine is only for you. Do not share this medicine with others. What if I miss a dose? It is important not to miss your dose. Call your doctor or health care professional if you are unable to keep an appointment. What may interact with this medicine? Interactions are not expected. Give your health care provider a list of all the medicines, herbs, non-prescription drugs, or dietary supplements you use. Also tell them if you smoke, drink alcohol, or use illegal drugs. Some items may interact with your medicine. This list may not describe all possible interactions. Give your health care provider a list of all the medicines, herbs, non-prescription drugs, or dietary supplements you use. Also tell them if you smoke, drink alcohol, or use illegal drugs. Some items may interact with your medicine. What should I watch for while using this medicine? Your condition will be monitored carefully while you are receiving this medicine. Report any side effects. Continue your course of treatment even though you feel ill unless your doctor tells you to stop. Call your doctor or health care professional for advice if you get a fever, chills or sore throat, or other symptoms of a cold or flu. Do not treat yourself. Try to avoid being around people who are sick. Do not  become pregnant while taking this medicine. Women should inform their doctor if they wish to become pregnant or think they might be pregnant. There is a potential for serious side effects to an unborn child. Talk to your health care professional or pharmacist for more information. Do not  breast-feed an infant while taking this medicine. Check with your doctor or health care professional if you get an attack of severe diarrhea, nausea and vomiting, or if you sweat a lot. The loss of too much body fluid can make it dangerous for you to take this medicine. You may get dizzy. Do not drive, use machinery, or do anything that needs mental alertness until you know how this medicine affects you. Do not stand or sit up quickly, especially if you are an older patient. This reduces the risk of dizzy or fainting spells. What side effects may I notice from receiving this medicine? Side effects that you should report to your doctor or health care professional as soon as possible: -allergic reactions like skin rash, itching or hives, swelling of the face, lips, or tongue -breathing problems -chest pain or palpitationschest tightness -cough -dark urine -dizziness -feeling faint or lightheaded -fever or chills -general ill feeling or flu-like symptoms -light-colored stools -palpitations -right upper belly pain -swelling of the legs or ankles -unusual bleeding or bruising -unusually weak or tired -yellowing of the eyes or skin Side effects that usually do not require medical attention (Report these to your doctor or health care professional if they continue or are bothersome.): -diarrhea -headache -nausea, vomiting -tiredness This list may not describe all possible side effects. Call your doctor for medical advice about side effects. You may report side effects to FDA at 1-800-FDA-1088. Where should I keep my medicine? This drug is given in a hospital or clinic and will not be stored at home. NOTE: This sheet is a summary. It may not cover all possible information. If you have questions about this medicine, talk to your doctor, pharmacist, or health care provider.  2015, [employer]vier/Gold Standard. (2011-06-27 17:02:29)

## 2014-06-21 NOTE — Telephone Encounter (Signed)
Spoke to pt to check on status. He is doing much better. He is tolerating the antibiotic fine. Reports no diarrhea or nausea. Pt reports the dressing needed to be changed twice yesterday due to soiling. He understands to keep clean and dry. Pt states he has minimal pain from the port removal. Pt did not have any further questions.

## 2014-06-21 NOTE — Assessment & Plan Note (Signed)
Bilirubin continues to improve; and is currently down from 2.10 down to 1.83.  Will continue to monitor closely.

## 2014-06-21 NOTE — Assessment & Plan Note (Signed)
Chemotherapy therapy-anemia; with hemoglobin slightly decreased from 9.2 down to 9.0.  Patient denies any worsening issues with either fatigue or shortness of breath with exertion.  Will continue to monitor closely.

## 2014-06-21 NOTE — Assessment & Plan Note (Signed)
Patient has had a chronic history of intermittent Port-A-Cath site erythema, edema, and mild tenderness.  Patient has been on frequent and antibiotics for treatment of questionable cellulitis surrounding the Port-A-Cath site.  However, patient's Port-A-Cath has continued to function with no issues whatsoever.  Patient presents for chemotherapy today; and is complaining of increased erythema, edema, and tenderness to his Port-A-Cath site.  On exam- entire Port-A-Cath site with increased erythema, mild edema, and increased tenderness with any palpation.  Decision was made to hold the port a cath access today; and to proceed with chemotherapy via peripheral IV stick only.  Patient remains afebrile.  Reviewed all with interventional radiology; and patient will be transported to interventional radiology for further evaluation and management of possible Port-A-Cath site infection following his chemotherapy today.  The plan is for the patient to continue with peripheral access only for chemotherapy short-term.

## 2014-06-21 NOTE — Progress Notes (Signed)
SYMPTOM MANAGEMENT CLINIC   HPI: Eddie Thomas 69 y.o. male diagnosed with multiple myeloma.  Currently undergoing Kyprolis, Pomalyst oral therapy, and Zometa therapy.  Patient has had a chronic history of intermittent Port-A-Cath site erythema, edema, and mild tenderness.  Patient has been on frequent and antibiotics for treatment of questionable cellulitis surrounding the Port-A-Cath site.  However, patient's Port-A-Cath has continued to function with no issues whatsoever.  Patient presents for chemotherapy today; and is complaining of increased erythema, edema, and tenderness to his Port-A-Cath site.  Patient remains afebrile.   HPI  ROS  Past Medical History  Diagnosis Date  . Multiple myeloma   . Diabetes mellitus 07/03/2011  . Hypertension 07/03/2011  . Hyperlipidemia 07/03/2011  . Colon polyps 2012  . Gastric ulcer     Past Surgical History  Procedure Laterality Date  . Limbal stem cell transplant    . Humerus fracture surgery      right  . 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    has No Known Allergies.    Medication List       This list is accurate as of: 06/20/14 11:59 PM.  Always use your most recent med list.               dexamethasone 4 MG tablet  Commonly known as:  DECADRON  TAKE FIVE TABLETS BY MOUTH EVERY WEEK START WITH THE FIRST DOSE OF CHEMOTHERAPY     lidocaine-prilocaine cream  Commonly known as:  EMLA  APPLY  CREAM TOPICALLY TO AFFECTED AREA AS DIRECTED     lisinopril 10 MG tablet  Commonly known as:  PRINIVIL,ZESTRIL  Take 5 mg by mouth every evening.     oxyCODONE-acetaminophen  5-325 MG per tablet  Commonly known as:  PERCOCET/ROXICET  Take 1 tablet by mouth every 6 (six) hours as needed for severe pain.     pantoprazole 40 MG tablet  Commonly known as:  PROTONIX  Take 1 tablet (40 mg total) by mouth 2 (two) times daily.     pioglitazone 15 MG tablet  Commonly known as:  ACTOS  Take 15 mg by mouth daily.     pomalidomide 4 MG capsule  Commonly known as:  POMALYST  Take 1 capsule (4 mg total) by mouth daily. Take with water on days 1-21. Repeat every 28 days.     prochlorperazine 10 MG tablet  Commonly known as:  COMPAZINE  Take 10 mg by mouth every 6 (six) hours as needed for nausea or vomiting.     simvastatin 10 MG tablet  Commonly known as:  ZOCOR  Take 10 mg by mouth at bedtime.     temazepam 15 MG capsule  Commonly known as:  RESTORIL  Take 15 mg by mouth at bedtime as needed for sleep.     valACYclovir 500 MG tablet  Commonly known as:  VALTREX  TAKE ONE TABLET BY MOUTH ONCE DAILY     VIAGRA 50 MG tablet  Generic drug:  sildenafil  Take 50 mg by mouth daily as needed for erectile dysfunction.     Vitamin D-3 5000 UNITS Tabs  Take 1 tablet by mouth daily.         PHYSICAL EXAMINATION  Oncology Vitals 06/21/2014 06/20/2014 06/20/2014 06/13/2014 06/12/2014 06/05/2014 05/23/2014  Height - 180 cm - - - 180 cm -  Weight - 79.379 kg - - - 79.289 kg -  Weight (lbs) - 175 lbs - - - 174 lbs 13 oz -  BMI (kg/m2) - 24.41 kg/m2 - - - 24.38 kg/m2 -  Temp 97.1 97.6 98.4 98.3 97.7 98 98.4  Pulse 60 62 62 60 68 63 62  Resp 18 16 16 20 18 18 19   Resp (Historical as of 08/21/11) - - - - - - -  SpO2 100 100 100 100 - 100 100  BSA (m2) - 1.99 m2 - - - 1.99 m2 -   BP Readings from Last 3 Encounters:  06/21/14 142/72  06/20/14 152/70  06/20/14 134/60    Physical Exam  Constitutional: He is oriented to person, place, and time and well-developed, well-nourished, and in no distress.  HENT:  Head: Normocephalic and atraumatic.  Eyes: Conjunctivae and EOM  are normal. Pupils are equal, round, and reactive to light. Right eye exhibits no discharge. Left eye exhibits no discharge. No scleral icterus.  Neck: Normal range of motion.  Pulmonary/Chest: Effort normal. No respiratory distress.  Musculoskeletal: Normal range of motion. He exhibits edema and tenderness.  See skin note regarding Port-A-Cath site.  Neurological: He is alert and oriented to person, place, and time. Gait normal.  Skin: Skin is warm and dry. No rash noted. There is erythema. No pallor.  On exam- entire Port-A-Cath site with increased erythema, mild edema, and increased tenderness with any palpation.  Decision was made to hold the port a cath access today; and to proceed with chemotherapy via peripheral IV stick only.       Psychiatric: Affect normal.  Nursing note and vitals reviewed.   LABORATORY DATA:. Hospital Outpatient Visit on 06/20/2014  Component Date Value Ref Range Status  . Glucose-Capillary 06/20/2014 187* 65 - 99 mg/dL Final  Appointment on 06/20/2014  Component Date Value Ref Range Status  . WBC 06/20/2014 3.8* 4.0 - 10.3 10e3/uL Final  . NEUT# 06/20/2014 2.1  1.5 - 6.5 10e3/uL Final  . HGB 06/20/2014 9.0* 13.0 - 17.1 g/dL Final  . HCT 06/20/2014 28.4* 38.4 - 49.9 % Final  . Platelets 06/20/2014 170  140 - 400 10e3/uL Final  . MCV 06/20/2014 79.3  79.3 - 98.0 fL Final  . MCH 06/20/2014 25.1* 27.2 - 33.4 pg Final  . MCHC 06/20/2014 31.6* 32.0 - 36.0 g/dL Final  . RBC 06/20/2014 3.58* 4.20 - 5.82 10e6/uL Final  . RDW 06/20/2014 17.5* 11.0 - 14.6 % Final  . lymph# 06/20/2014 0.7* 0.9 - 3.3 10e3/uL Final  . MONO# 06/20/2014 0.6  0.1 - 0.9 10e3/uL Final  . Eosinophils Absolute 06/20/2014 0.2  0.0 - 0.5 10e3/uL Final  . Basophils Absolute 06/20/2014 0.1  0.0 - 0.1 10e3/uL Final  . NEUT% 06/20/2014 56.1  39.0 - 75.0 % Final  . LYMPH% 06/20/2014 19.3  14.0 - 49.0 % Final  . MONO% 06/20/2014 16.9* 0.0 - 14.0 % Final  . EOS% 06/20/2014 6.3  0.0 - 7.0 %  Final  . BASO% 06/20/2014 1.4  0.0 - 2.0 % Final  . Sodium 06/20/2014 138  136 - 145 mEq/L Final  . Potassium 06/20/2014 4.4  3.5 - 5.1 mEq/L Final  . Chloride 06/20/2014 104  98 - 109 mEq/L Final  . CO2 06/20/2014 23  22 - 29 mEq/L Final  . Glucose 06/20/2014 155* 70 - 140 mg/dl  Final  . BUN 06/20/2014 12.2  7.0 - 26.0 mg/dL Final  . Creatinine 06/20/2014 1.0  0.7 - 1.3 mg/dL Final  . Total Bilirubin 06/20/2014 1.83* 0.20 - 1.20 mg/dL Final  . Alkaline Phosphatase 06/20/2014 50  40 - 150 U/L Final  . AST 06/20/2014 13  5 - 34 U/L Final  . ALT 06/20/2014 14  0 - 55 U/L Final  . Total Protein 06/20/2014 5.8* 6.4 - 8.3 g/dL Final  . Albumin 06/20/2014 3.6  3.5 - 5.0 g/dL Final  . Calcium 06/20/2014 8.9  8.4 - 10.4 mg/dL Final  . Anion Gap 06/20/2014 11  3 - 11 mEq/L Final  . EGFR 06/20/2014 >90  >90 ml/min/1.73 m2 Final   eGFR is calculated using the CKD-EPI Creatinine Equation (2009)  . IgG (Immunoglobin G), Serum 06/20/2014 181* 650 - 1600 mg/dL Preliminary  . IgA 06/20/2014 156  68 - 379 mg/dL Preliminary  . IgM, Serum 06/20/2014 <5* 41 - 251 mg/dL Preliminary  . LDH 06/20/2014 196  125 - 245 U/L Final     RADIOGRAPHIC STUDIES: Ir Removal Tun Access W/ Port W/o Fl Mod Sed  06/20/2014   CLINICAL DATA:  Purulent drainage from access site of port catheter  EXAM: EXAM TUNNELED PORT CATHETER REMOVAL  TECHNIQUE: Overlying skin prepped with chlorhexidine, draped in usual sterile fashion, infiltrated locally with 1% lidocaine. A small incision was made over the port. The port catheter was dissected free from the underlying soft tissues and removed intact. Hemostasis was achieved. The port pocket was dressed with iodoform gauze and covered with a sterile dressing. The patient tolerated the procedure well.  COMPLICATIONS: COMPLICATIONS None immediate  IMPRESSION: 1.  Technically successful tunneled Port catheter removal.  We will see patient back in 1 week for dressing change. He will be started  empirically on doxycycline p.o. b.i.d., which may be modified once sensitivities return from culture of the purulent drainage.   Electronically Signed   By: Lucrezia Europe M.D.   On: 06/20/2014 16:31    ASSESSMENT/PLAN:    Multiple myeloma Patient appears to be tolerating his Kyprolis chemotherapy fairly well.  He also continues to take his oral Pomalyst as directed.  Last Zometa infusion was 05/08/2014.  His Zometa infusions are scheduled on an every eight-week basis.  His next Zometa infusion is scheduled for 07/08/2014; but may very well the given during patient's 07/03/2014 visit.  Blood count remains stable; the exception of a mild anemia with hemoglobin 9.0.  Patient is noted to have some localized erythema, mild edema, and tenderness to right upper chest wall Port-A-Cath site.  Patient will be transported to interventional radiology for further evaluation and management of Port-A-Cath.  Decision was made to proceed with chemotherapy today peripherally.  Patient is scheduled to return for cycle 3, day 16 of his chemotherapy tomorrow, 06/21/2014.  He is scheduled to return on 07/03/2014 for labs, follow up visit, and  chemotherapy.   Hyperbilirubinemia Bilirubin continues to improve; and is currently down from 2.10 down to 1.83.  Will continue to monitor closely.   Infection due to portacath Patient has had a chronic history of intermittent Port-A-Cath site erythema, edema, and mild tenderness.  Patient has been on frequent and antibiotics for treatment of questionable cellulitis surrounding the Port-A-Cath site.  However, patient's Port-A-Cath has continued to function with no issues whatsoever.  Patient presents for chemotherapy today; and is complaining of increased erythema, edema, and tenderness to his Port-A-Cath site.  On exam- entire Port-A-Cath site  with increased erythema, mild edema, and increased tenderness with any palpation.  Decision was made to hold the port a cath access  today; and to proceed with chemotherapy via peripheral IV stick only.  Patient remains afebrile.  Reviewed all with interventional radiology; and patient will be transported to interventional radiology for further evaluation and management of possible Port-A-Cath site infection following his chemotherapy today.  The plan is for the patient to continue with peripheral access only for chemotherapy short-term.   Antineoplastic chemotherapy induced anemia Chemotherapy therapy-anemia; with hemoglobin slightly decreased from 9.2 down to 9.0.  Patient denies any worsening issues with either fatigue or shortness of breath with exertion.  Will continue to monitor closely.   Patient stated understanding of all instructions; and was in agreement with this plan of care. The patient knows to call the clinic with any problems, questions or concerns.   Review/collaboration with Dr. Julien Nordmann regarding all aspects of patient's visit today.   Total time spent with patient was 40 minutes;  with greater than 75 percent of that time spent in face to face counseling regarding patient's symptoms,  and coordination of care and follow up.  Disclaimer: This note was dictated with voice recognition software. Similar sounding words can inadvertently be transcribed and may not be corrected upon review.   Drue Second, NP 06/21/2014

## 2014-06-22 ENCOUNTER — Other Ambulatory Visit: Payer: Self-pay | Admitting: Medical Oncology

## 2014-06-22 DIAGNOSIS — C9000 Multiple myeloma not having achieved remission: Secondary | ICD-10-CM

## 2014-06-22 LAB — BETA 2 MICROGLOBULIN, SERUM: Beta-2 Microglobulin: 2.08 mg/L (ref <=2.51)

## 2014-06-22 LAB — IGG, IGA, IGM
IGA: 156 mg/dL (ref 68–379)
IgG (Immunoglobin G), Serum: 181 mg/dL — ABNORMAL LOW (ref 650–1600)

## 2014-06-22 LAB — KAPPA/LAMBDA LIGHT CHAINS
KAPPA FREE LGHT CHN: 0.03 mg/dL — AB (ref 0.33–1.94)
KAPPA LAMBDA RATIO: 0.03 — AB (ref 0.26–1.65)
LAMBDA FREE LGHT CHN: 1.2 mg/dL (ref 0.57–2.63)

## 2014-06-22 MED ORDER — POMALIDOMIDE 4 MG PO CAPS: 4.0000 mg | ORAL_CAPSULE | Freq: Every day | ORAL | Status: DC

## 2014-06-26 ENCOUNTER — Ambulatory Visit (HOSPITAL_COMMUNITY)
Admission: RE | Admit: 2014-06-26 | Discharge: 2014-06-26 | Disposition: A | Discharge status: Home / Self Care | Payer: Medicare Other | Source: Ambulatory Visit | Attending: Interventional Radiology | Admitting: Interventional Radiology

## 2014-06-26 DIAGNOSIS — T148XXA Other injury of unspecified body region, initial encounter: Secondary | ICD-10-CM

## 2014-06-26 DIAGNOSIS — T798XXA Other early complications of trauma, initial encounter: Secondary | ICD-10-CM

## 2014-06-26 DIAGNOSIS — L089 Local infection of the skin and subcutaneous tissue, unspecified: Secondary | ICD-10-CM | POA: Insufficient documentation

## 2014-06-26 NOTE — Progress Notes (Signed)
Patient ID: Eddie Thomas, male   DOB: Jul 27, 1945, 69 y.o.   MRN: [id number]     Referring Physician(s): Watts,John  Subjective:  Mr Malinoski is here today for recheck of his Port A cath removal site.    He had it removed last week by Dr. Vernard Gambles secondary to infection.  He states he has been changing the dressing daily, however he did not remove the packing.  He denies fever or chills.  He states the area does feel a litte better.  Allergies: Review of patient's allergies indicates no known allergies.  Medications: Prior to Admission medications   Medication Sig Start Date End Date Taking? Authorizing Provider  Cholecalciferol (VITAMIN D-3) 5000 UNITS TABS Take 1 tablet by mouth daily.    Historical Provider, MD  dexamethasone (DECADRON) 4 MG tablet TAKE FIVE TABLETS BY MOUTH EVERY WEEK START WITH THE FIRST DOSE OF CHEMOTHERAPY 05/15/14   Carlton Adam, PA-C  lidocaine-prilocaine (EMLA) cream APPLY  CREAM TOPICALLY TO AFFECTED AREA AS DIRECTED 05/15/14   Curt Bears, MD  lisinopril (PRINIVIL,ZESTRIL) 10 MG tablet Take 5 mg by mouth every evening.     Historical Provider, MD  oxyCODONE-acetaminophen (PERCOCET/ROXICET) 5-325 MG per tablet Take 1 tablet by mouth every 6 (six) hours as needed for severe pain. Patient not taking: Reported on 05/19/2014 07/21/13   Curt Bears, MD  pantoprazole (PROTONIX) 40 MG tablet Take 1 tablet (40 mg total) by mouth 2 (two) times daily. 09/22/13   Ladene Artist, MD  pioglitazone (ACTOS) 15 MG tablet Take 15 mg by mouth daily. 08/19/12   Historical Provider, MD  pomalidomide (POMALYST) 4 MG capsule Take 1 capsule (4 mg total) by mouth daily. Take with water on days 1-21. Repeat every 28 days. 06/22/14   Curt Bears, MD  prochlorperazine (COMPAZINE) 10 MG tablet Take 10 mg by mouth every 6 (six) hours as needed for nausea or vomiting.    Historical Provider, MD  simvastatin (ZOCOR) 10 MG tablet Take 10 mg by mouth at bedtime.      Historical  Provider, MD  temazepam (RESTORIL) 15 MG capsule Take 15 mg by mouth at bedtime as needed for sleep.    Historical Provider, MD  valACYclovir (VALTREX) 500 MG tablet TAKE ONE TABLET BY MOUTH ONCE DAILY 03/14/14   Adrena E Johnson, PA-C  VIAGRA 50 MG tablet Take 50 mg by mouth daily as needed for erectile dysfunction.  02/23/13   Historical Provider, MD     Vital Signs: There were no vitals taken for this visit.  Physical Exam  Right chest dressing was removed.  I moistened the packing with normal saline prior to removal.  All packing was easily removed.  Wound was irrigated with normal saline.  I moistened a corner of gauze with remainder of saline, loosely packed the wound, and placed a sterile gauze and secured it with paper tape.  Pt tolerated this well.  There was no purulence seen.   Imaging: No results found.  Labs:  CBC:  Recent Labs  05/29/14 0936 06/05/14 0914 06/12/14 0904 06/20/14 0920  WBC 2.4* 3.1* 3.1* 3.8*  HGB 8.5* 8.6* 9.2* 9.0*  HCT 27.2* 27.8* 29.0* 28.4*  PLT 113* 211 146 170    COAGS: No results for input(s): INR, APTT in the last 8760 hours.  BMP:  Recent Labs  08/30/13 0935  05/29/14 0936 06/05/14 0914 06/12/14 0904 06/20/14 0920  NA 139  < > 139 139 138 138  K 4.1  < >  4.4 4.1 4.2 4.4  CL 101  --   --   --   --   --   CO2 26  < > 23 23 22 23   GLUCOSE 154*  < > 146* 122 138 155*  BUN 11  < > 19.2 13.7 12.7 12.2  CALCIUM 9.2  < > 8.6 8.8 8.5 8.9  CREATININE 1.03  < > 1.0 1.0 0.9 1.0  < > = values in this interval not displayed.  LIVER FUNCTION TESTS:  Recent Labs  05/29/14 0936 06/05/14 0914 06/12/14 0904 06/20/14 0920  BILITOT 1.79* 1.87* 2.10* 1.83*  AST 9 14 11 13   ALT 13 13 10 14   ALKPHOS 44 49 52 50  PROT 5.3* 5.7* 5.9* 5.8*  ALBUMIN 3.5 3.8 3.6 3.6    Assessment and Plan:  S/P removal of Port a Cath secondary to infection  Dressing change instructions given to patient which include moistening a gauze with  saline, loosely packing into the wound, and covering it.  Ok to shower now and cleanse the area gently.  He is to irrigate the wound saline.  I gave him instructions on how to make saline at home.  He understands  Will have him return in 1 week to recheck the wound.  SignedNevin Bloodgood 06/26/2014, 11:45 AM   I spent a total of 15 Minutes in face to face in clinical consultation/evaluation, greater than 50% of which was counseling/coordinating care for port site wound.

## 2014-06-27 ENCOUNTER — Other Ambulatory Visit (HOSPITAL_COMMUNITY): Payer: Self-pay | Admitting: Physician Assistant

## 2014-06-27 DIAGNOSIS — T798XXD Other early complications of trauma, subsequent encounter: Secondary | ICD-10-CM

## 2014-07-03 ENCOUNTER — Ambulatory Visit (HOSPITAL_BASED_OUTPATIENT_CLINIC_OR_DEPARTMENT_OTHER): Payer: Medicare Other | Admitting: Physician Assistant

## 2014-07-03 ENCOUNTER — Encounter: Payer: Self-pay | Admitting: Physician Assistant

## 2014-07-03 ENCOUNTER — Telehealth: Payer: Self-pay | Admitting: Physician Assistant

## 2014-07-03 ENCOUNTER — Ambulatory Visit (HOSPITAL_COMMUNITY)
Admission: RE | Admit: 2014-07-03 | Discharge: 2014-07-03 | Disposition: A | Discharge status: Home / Self Care | Payer: Medicare Other | Source: Ambulatory Visit | Attending: Physician Assistant | Admitting: Physician Assistant

## 2014-07-03 ENCOUNTER — Ambulatory Visit (HOSPITAL_BASED_OUTPATIENT_CLINIC_OR_DEPARTMENT_OTHER): Payer: Medicare Other

## 2014-07-03 ENCOUNTER — Other Ambulatory Visit (HOSPITAL_BASED_OUTPATIENT_CLINIC_OR_DEPARTMENT_OTHER): Payer: Medicare Other

## 2014-07-03 ENCOUNTER — Other Ambulatory Visit (HOSPITAL_COMMUNITY): Payer: Self-pay | Admitting: Radiology

## 2014-07-03 VITALS — BP 140/78 | HR 69 | Temp 98.4°F | Resp 18 | Ht 71.0 in | Wt 173.6 lb

## 2014-07-03 DIAGNOSIS — Z5111 Encounter for antineoplastic chemotherapy: Secondary | ICD-10-CM | POA: Diagnosis not present

## 2014-07-03 DIAGNOSIS — C9000 Multiple myeloma not having achieved remission: Secondary | ICD-10-CM

## 2014-07-03 DIAGNOSIS — R5383 Other fatigue: Secondary | ICD-10-CM

## 2014-07-03 DIAGNOSIS — Z5112 Encounter for antineoplastic immunotherapy: Secondary | ICD-10-CM | POA: Diagnosis present

## 2014-07-03 DIAGNOSIS — T798XXD Other early complications of trauma, subsequent encounter: Secondary | ICD-10-CM

## 2014-07-03 DIAGNOSIS — T80219D Unspecified infection due to central venous catheter, subsequent encounter: Secondary | ICD-10-CM

## 2014-07-03 DIAGNOSIS — C9002 Multiple myeloma in relapse: Secondary | ICD-10-CM

## 2014-07-03 DIAGNOSIS — D6481 Anemia due to antineoplastic chemotherapy: Secondary | ICD-10-CM | POA: Diagnosis not present

## 2014-07-03 DIAGNOSIS — Z4889 Encounter for other specified surgical aftercare: Secondary | ICD-10-CM | POA: Diagnosis not present

## 2014-07-03 LAB — CBC WITH DIFFERENTIAL/PLATELET
BASO%: 0.7 % (ref 0.0–2.0)
Basophils Absolute: 0 10*3/uL (ref 0.0–0.1)
EOS%: 0.3 % (ref 0.0–7.0)
Eosinophils Absolute: 0 10*3/uL (ref 0.0–0.5)
HEMATOCRIT: 28.1 % — AB (ref 38.4–49.9)
HGB: 8.6 g/dL — ABNORMAL LOW (ref 13.0–17.1)
LYMPH#: 0.5 10*3/uL — AB (ref 0.9–3.3)
LYMPH%: 17.5 % (ref 14.0–49.0)
MCH: 24.9 pg — AB (ref 27.2–33.4)
MCHC: 30.6 g/dL — AB (ref 32.0–36.0)
MCV: 81.2 fL (ref 79.3–98.0)
MONO#: 0.4 10*3/uL (ref 0.1–0.9)
MONO%: 13.1 % (ref 0.0–14.0)
NEUT#: 2 10*3/uL (ref 1.5–6.5)
NEUT%: 68.4 % (ref 39.0–75.0)
Platelets: 206 10*3/uL (ref 140–400)
RBC: 3.46 10*6/uL — ABNORMAL LOW (ref 4.20–5.82)
RDW: 18.2 % — AB (ref 11.0–14.6)
WBC: 2.9 10*3/uL — AB (ref 4.0–10.3)

## 2014-07-03 LAB — COMPREHENSIVE METABOLIC PANEL (CC13)
ALT: 12 U/L (ref 0–55)
ANION GAP: 10 meq/L (ref 3–11)
AST: 12 U/L (ref 5–34)
Albumin: 3.7 g/dL (ref 3.5–5.0)
Alkaline Phosphatase: 44 U/L (ref 40–150)
BILIRUBIN TOTAL: 1.83 mg/dL — AB (ref 0.20–1.20)
BUN: 10.7 mg/dL (ref 7.0–26.0)
CO2: 21 meq/L — AB (ref 22–29)
CREATININE: 1.1 mg/dL (ref 0.7–1.3)
Calcium: 9.2 mg/dL (ref 8.4–10.4)
Chloride: 108 mEq/L (ref 98–109)
EGFR: 83 mL/min/{1.73_m2} — AB (ref >90)
GLUCOSE: 173 mg/dL — AB (ref 70–140)
Potassium: 4.3 mEq/L (ref 3.5–5.1)
Sodium: 139 mEq/L (ref 136–145)
Total Protein: 5.7 g/dL — ABNORMAL LOW (ref 6.4–8.3)

## 2014-07-03 MED ORDER — SODIUM CHLORIDE 0.9 % IV SOLN
Freq: Once | INTRAVENOUS | Status: AC
  Administered 2014-07-03: 11:00:00 via INTRAVENOUS

## 2014-07-03 MED ORDER — SODIUM CHLORIDE 0.9 % IV SOLN: Freq: Once | INTRAVENOUS | Status: DC

## 2014-07-03 MED ORDER — DEXAMETHASONE SODIUM PHOSPHATE 100 MG/10ML IJ SOLN
Freq: Once | INTRAMUSCULAR | Status: AC
  Administered 2014-07-03: 11:00:00 via INTRAVENOUS
  Filled 2014-07-03: qty 4

## 2014-07-03 MED ORDER — ZOLEDRONIC ACID 4 MG/5ML IV CONC
4.0000 mg | Freq: Once | INTRAVENOUS | Status: AC
  Administered 2014-07-03: 4 mg via INTRAVENOUS
  Filled 2014-07-03: qty 5

## 2014-07-03 MED ORDER — DEXTROSE 5 % IV SOLN
36.0000 mg/m2 | Freq: Once | INTRAVENOUS | Status: AC
  Administered 2014-07-03: 72 mg via INTRAVENOUS
  Filled 2014-07-03: qty 36

## 2014-07-03 NOTE — Patient Instructions (Signed)
[facility] Discharge Instructions for Patients Receiving Chemotherapy  Today you received the following chemotherapy agents: Kyprolis   To help prevent nausea and vomiting after your treatment, we encourage you to take your nausea medication as directed.    If you develop nausea and vomiting that is not controlled by your nausea medication, call the clinic.   BELOW ARE SYMPTOMS THAT SHOULD BE REPORTED IMMEDIATELY:  *FEVER GREATER THAN 100.5 F  *CHILLS WITH OR WITHOUT FEVER  NAUSEA AND VOMITING THAT IS NOT CONTROLLED WITH YOUR NAUSEA MEDICATION  *UNUSUAL SHORTNESS OF BREATH  *UNUSUAL BRUISING OR BLEEDING  TENDERNESS IN MOUTH AND THROAT WITH OR WITHOUT PRESENCE OF ULCERS  *URINARY PROBLEMS  *BOWEL PROBLEMS  UNUSUAL RASH Items with * indicate a potential emergency and should be followed up as soon as possible.  Feel free to call the clinic you have any questions or concerns. The clinic phone number is [phone].  Please show the CHEMO ALERT CARD at check-in to the Emergency Department and triage nurse.   

## 2014-07-03 NOTE — Progress Notes (Signed)
Okay to treat with total bili 1.83 per Awilda Metro, PA.

## 2014-07-03 NOTE — Progress Notes (Signed)
Patient seen today in follow-up from 06/20/14 when he had his right port a catheter removed secondary to infection. The patient completed his Doxycycline Rx and denies any fever or chills. He denies any drainage at port wound site. He has been changing his dressing daily with saline and gauze packing.   PE: right chest wall wound without erythema or induration. No drainage seen, non-tender with well healing granulation tissue.   Plan: Continue saline gauze packing and dressing changes daily, follow-up in IR next week for site check and dressing change. Patient instructed to call if he develops any drainage at site, tenderness, extending redness, fever or chills. He states understanding of the above plan.   Tsosie Billing PA-C Interventional Radiology  07/03/14  3:56 PM

## 2014-07-03 NOTE — Progress Notes (Addendum)
[name] [phone]   [phone]  OFFICE PROGRESS NOTE  Eddie Thomas 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   PRIOR THERAPY: :  1. Status post 6 cycles of systemic chemotherapy with Revlimid and Decadron, last dose was given 07/21/2010 with very good response. 2. 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 [name].  3. maintenance Revlimid at 10 mg by mouth daily status post 2 months. Therapy began 01/18/2011. 4. maintenance Revlimid at 15 mg by mouth daily with prophylactic dose Coumadin at 2 mg by mouth daily. 5. Systemic chemotherapy with Velcade at 1.3 mg per meter squared given on days 1, 4, 8 and 11 and Doxil at 30 mg per meter square given on day 4 and Decadron 40 mg by mouth on weekly basis given every 3 weeks. Status post 4 cycles. 6. Zometa 4 mg IV every 4 weeks.  7. Velcade 1.3 mg/M2 subcutaneous daily on a weekly basis with Decadron 20 mg by mouth on a weekly basis. First cycle expected on 06/21/2012. S/P 4 cycles. 8. Systemic chemotherapy with Carfilzomib, cyclophosphamide and Decadron. First cycle started on 08/09/2012. He is status post 19 cycles.   CURRENT THERAPY:  1) Systemic chemotherapy with Carfilzomib 36 MG/M2 on days 1, 2, 8, 9, 15 and 16 every 4 weeks, Pomalyst 4 mg by mouth daily for 21 days every 4 weeks and Decadron 40 mg by mouth weekly. First cycle started on 04/10/2014. Status post 2 cycles  2) Zometa every 8 weeks.    INTERVAL HISTORY: Eddie Thomas 69 y.o. male returns to the clinic today for followup visit. He is currently on treatment with Carfilzomib, Pomalyst and dexamethasone status post 3 cycles and is tolerating his treatment fairly well. He was recently treated for cellulitis involving the right chest area around his Port-A-Cath with recent removal of  his Port-A-Cath secondary to infection on 06/20/2014.  His surgical incision is currently being healing by secondary intent. He is cleaning and packing it daily as he was instructed. He has a follow-up appointment with interventional radiologist today to check on its progress. He reports no difficulty with flushing or blood return to his knowledge. He denied fever or chills. He voiced no other specific complaints today. He recently had restaging protein studies drawn and presents to discuss the results.   MEDICAL HISTORY: Past Medical History  Diagnosis Date  . Multiple myeloma   . Diabetes mellitus 07/03/2011  . Hypertension 07/03/2011  . Hyperlipidemia 07/03/2011  . Colon polyps 2012  . Gastric ulcer     ALLERGIES:  has No Known Allergies.  MEDICATIONS:  Current Outpatient Prescriptions  Medication Sig Dispense Refill  . Cholecalciferol (VITAMIN D-3) 5000 UNITS TABS Take 1 tablet by mouth daily.    [name] dexamethasone (DECADRON) 4 MG tablet TAKE FIVE TABLETS BY MOUTH EVERY WEEK START WITH THE FIRST DOSE OF CHEMOTHERAPY 80 tablet 0  . lisinopril (PRINIVIL,ZESTRIL) 10 MG tablet Take 5 mg by mouth every evening.     . pantoprazole (PROTONIX) 40 MG tablet Take 1 tablet (40 mg total) by mouth 2 (two) times daily. 60 tablet 11  . pioglitazone (ACTOS) 15 MG tablet Take 15 mg by mouth daily.    . pomalidomide (POMALYST) 4 MG capsule Take 1 capsule (4 mg total) by mouth daily. Take with water on days 1-21. Repeat every  28 days. 21 capsule 0  . simvastatin (ZOCOR) 10 MG tablet Take 10 mg by mouth at bedtime.      . valACYclovir (VALTREX) 500 MG tablet TAKE ONE TABLET BY MOUTH ONCE DAILY 30 tablet 3  . oxyCODONE-acetaminophen (PERCOCET/ROXICET) 5-325 MG per tablet Take 1 tablet by mouth every 6 (six) hours as needed for severe pain. (Patient not taking: Reported on 07/03/2014) 30 tablet 0  . prochlorperazine (COMPAZINE) 10 MG tablet Take 10 mg by mouth every 6 (six) hours as needed for nausea or vomiting.     . temazepam (RESTORIL) 15 MG capsule Take 15 mg by mouth at bedtime as needed for sleep.    [name] VIAGRA 50 MG tablet Take 50 mg by mouth daily as needed for erectile dysfunction.      No current facility-administered medications for this visit.    SURGICAL HISTORY:  Past Surgical History  Procedure Laterality Date  . Limbal stem cell transplant    . Humerus fracture surgery      right  . 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    REVIEW OF SYSTEMS:  Constitutional: positive for fatigue Eyes: negative Ears, nose, mouth, throat, and face: negative Respiratory: negative Cardiovascular: negative Gastrointestinal: negative Genitourinary:negative Integument/breast: positive for Resolution of the erythema around the Port-A-Cath however mild tenderness to touch and remains Hematologic/lymphatic: negative Musculoskeletal:negative Neurological: negative Behavioral/Psych: negative Endocrine: negative Allergic/Immunologic: negative   PHYSICAL EXAMINATION: General appearance: alert, cooperative and no distress Head: Normocephalic, without obvious abnormality, atraumatic Neck: no adenopathy, no JVD, supple, symmetrical, trachea midline and thyroid not enlarged, symmetric, no tenderness/mass/nodules Lymph nodes: Cervical, supraclavicular, and axillary nodes normal. Resp: clear to auscultation bilaterally Back: symmetric, no curvature. ROM normal. No CVA tenderness. Cardio: regular rate and rhythm, S1, S2 normal, no murmur, click, rub or gallop GI: soft, non-tender; bowel sounds normal; no masses,  no organomegaly Extremities: extremities normal, atraumatic, no cyanosis or edema Neurologic: Alert and oriented X 3, normal strength and tone. Normal symmetric reflexes. Normal coordination and gait Right anterior chest incision from Port-A-Cath removal is packed, no active bleeding or serosanguineous oozing. Site is nontender. Dressing is clean and dry.   ECOG PERFORMANCE  STATUS: 1 - Symptomatic but completely ambulatory  Blood pressure 140/78, pulse 69, temperature 98.4 F (36.9 C), temperature source Oral, resp. rate 18, height 5' 11"  (1.803 m), weight 173 lb 9.6 oz (78.744 kg), SpO2 100 %.  LABORATORY DATA: Lab Results  Component Value Date   WBC 2.9* 07/03/2014   HGB 8.6* 07/03/2014   HCT 28.1* 07/03/2014   MCV 81.2 07/03/2014   PLT 206 07/03/2014      Chemistry      Component Value Date/Time   NA 139 07/03/2014 0931   NA 139 08/30/2013 0935   K 4.3 07/03/2014 0931   K 4.1 08/30/2013 0935   CL 101 08/30/2013 0935   CL 103 07/12/2012 0907   CO2 21* 07/03/2014 0931   CO2 26 08/30/2013 0935   BUN 10.7 07/03/2014 0931   BUN 11 08/30/2013 0935   CREATININE 1.1 07/03/2014 0931   CREATININE 1.03 08/30/2013 0935      Component Value Date/Time   CALCIUM 9.2 07/03/2014 0931   CALCIUM 9.2 08/30/2013 0935   ALKPHOS 44 07/03/2014 0931   ALKPHOS 39 08/30/2013 0935   AST 12 07/03/2014 0931   AST 13 08/30/2013 0935   ALT 12 07/03/2014 0931   ALT 9 08/30/2013 0935   BILITOT 1.83* 07/03/2014 0931  BILITOT 1.3* 08/30/2013 0935     Other lab results:   RADIOGRAPHIC STUDIES: No results found.  ASSESSMENT AND PLAN: This is a very pleasant 69 year old African American male with history of multiple myeloma currently undergoing systemic chemotherapy with Carfilzomib, cyclophosphamide and Decadron status post 19 cycles. This was discontinued secondary to disease progression. The patient has a started on systemic chemotherapy with Carfilzomib, Pomalyst and Decadron status post 3 cycles. He is tolerating his treatment fairly well. Treatment related anemia continues, but patient is asymptomatic. Hgb today was 8.6.  Bilirubin is elevated at 1.83 and is stable. Will continue to monitor this value. No abdominal symptoms and no icterus. Patient discussed with and also seen by Dr. Julien Nordmann. His restaging protein studies showed significant improvement in his  protein studies with normalization of his beta-2 microglobin N IIA GA, there was slight increase in his IgG however, there was a dramatic decrease in his lambda free light chain from 15.202 normalized value of 1.20. These results were reviewed with the patient. He will continue on his current treatment and follow-up in one month with the start of the next cycle of chemotherapy. He will proceed with day 1 of cycle #4 today as scheduled. He will proceed with Zometa as scheduled today as well.   Patient was advised to follow-up with interventional radiology regarding his Port-A-Cath removal incision wound healing as previously scheduled.  He was advised to call immediately if he has any concerning symptoms in the interval.  The patient voices understanding of current disease status and treatment options and is in agreement with the current care plan. All questions were answered. The patient knows to call the clinic with any problems, questions or concerns. We can certainly see the patient much sooner if necessary.  Carlton Adam, PA-C 07/03/2014   ADDENDUM: Hematology/Oncology Attending: I had a face to face encounter with the patient. I recommended his care plan. This is a very pleasant 69 years old African-American male with relapsed multiple myeloma currently undergoing systemic chemotherapy with Carfilzomib, or malaise and Decadron status post 2 cycles. He is tolerating his treatment fairly well with no significant adverse effects except for mild fatigue. The recent myeloma panel showed significant improvement in his disease after the first 2 cycles of this regimen. I recommended for him to continue systemic treatment with Carfilzomib, poor malaise 10 Decadron as a scheduled. The patient would come back for follow-up visit in one month for reevaluation. The patient will also continue his treatment with Zometa as scheduled. He was advised to call immediately if he has any concerning symptoms in  the interval.  Disclaimer: This note was dictated with voice recognition software. Similar sounding words can inadvertently be transcribed and may not be corrected upon review.  Eilleen Kempf., MD 07/05/2014

## 2014-07-03 NOTE — Telephone Encounter (Signed)
per pof to sch pt appt-gave pt copy of avs °

## 2014-07-04 ENCOUNTER — Ambulatory Visit (HOSPITAL_BASED_OUTPATIENT_CLINIC_OR_DEPARTMENT_OTHER): Payer: Medicare Other

## 2014-07-04 VITALS — BP 120/63 | HR 58 | Temp 98.0°F | Resp 16

## 2014-07-04 DIAGNOSIS — Z5112 Encounter for antineoplastic immunotherapy: Secondary | ICD-10-CM

## 2014-07-04 DIAGNOSIS — C9000 Multiple myeloma not having achieved remission: Secondary | ICD-10-CM | POA: Diagnosis not present

## 2014-07-04 MED ORDER — DEXTROSE 5 % IV SOLN
36.0000 mg/m2 | Freq: Once | INTRAVENOUS | Status: AC
  Administered 2014-07-04: 72 mg via INTRAVENOUS
  Filled 2014-07-04: qty 36

## 2014-07-04 MED ORDER — SODIUM CHLORIDE 0.9 % IV SOLN: Freq: Once | INTRAVENOUS | Status: DC

## 2014-07-04 MED ORDER — SODIUM CHLORIDE 0.9 % IV SOLN
Freq: Once | INTRAVENOUS | Status: AC
  Administered 2014-07-04: 10:00:00 via INTRAVENOUS
  Filled 2014-07-04: qty 4

## 2014-07-04 MED ORDER — SODIUM CHLORIDE 0.9 % IV SOLN
Freq: Once | INTRAVENOUS | Status: AC
  Administered 2014-07-04: 10:00:00 via INTRAVENOUS

## 2014-07-04 NOTE — Patient Instructions (Signed)
Your repeat protein studies showed significant improvement in your disease You will continue with your current chemotherapy as scheduled Follow-up in one month Follow-up with interventional radiology's previous scheduled regarding your Port-A-Cath removal incision healing

## 2014-07-04 NOTE — Patient Instructions (Signed)
[facility] Discharge Instructions for Patients Receiving Chemotherapy  Today you received the following chemotherapy agent: Kyprolis.  To help prevent nausea and vomiting after your treatment, we encourage you to take your nausea medication as prescribed.   If you develop nausea and vomiting that is not controlled by your nausea medication, call the clinic.   BELOW ARE SYMPTOMS THAT SHOULD BE REPORTED IMMEDIATELY:  *FEVER GREATER THAN 100.5 F  *CHILLS WITH OR WITHOUT FEVER  NAUSEA AND VOMITING THAT IS NOT CONTROLLED WITH YOUR NAUSEA MEDICATION  *UNUSUAL SHORTNESS OF BREATH  *UNUSUAL BRUISING OR BLEEDING  TENDERNESS IN MOUTH AND THROAT WITH OR WITHOUT PRESENCE OF ULCERS  *URINARY PROBLEMS  *BOWEL PROBLEMS  UNUSUAL RASH Items with * indicate a potential emergency and should be followed up as soon as possible.  Feel free to call the clinic you have any questions or concerns. The clinic phone number is [phone].  Please show the CHEMO ALERT CARD at check-in to the Emergency Department and triage nurse.   

## 2014-07-10 ENCOUNTER — Other Ambulatory Visit: Payer: Medicare Other

## 2014-07-10 ENCOUNTER — Ambulatory Visit (HOSPITAL_BASED_OUTPATIENT_CLINIC_OR_DEPARTMENT_OTHER): Payer: Medicare Other

## 2014-07-10 ENCOUNTER — Ambulatory Visit: Payer: Medicare Other | Admitting: Internal Medicine

## 2014-07-10 ENCOUNTER — Other Ambulatory Visit (HOSPITAL_COMMUNITY): Payer: Self-pay | Admitting: Radiology

## 2014-07-10 ENCOUNTER — Ambulatory Visit (HOSPITAL_COMMUNITY)
Admission: RE | Admit: 2014-07-10 | Discharge: 2014-07-10 | Disposition: A | Discharge status: Home / Self Care | Payer: Medicare Other | Source: Ambulatory Visit | Attending: Radiology | Admitting: Radiology

## 2014-07-10 ENCOUNTER — Other Ambulatory Visit (HOSPITAL_BASED_OUTPATIENT_CLINIC_OR_DEPARTMENT_OTHER): Payer: Medicare Other

## 2014-07-10 VITALS — BP 137/59 | HR 69 | Temp 97.2°F | Resp 18

## 2014-07-10 DIAGNOSIS — C9002 Multiple myeloma in relapse: Secondary | ICD-10-CM | POA: Diagnosis present

## 2014-07-10 DIAGNOSIS — T798XXD Other early complications of trauma, subsequent encounter: Secondary | ICD-10-CM

## 2014-07-10 DIAGNOSIS — C9000 Multiple myeloma not having achieved remission: Secondary | ICD-10-CM

## 2014-07-10 DIAGNOSIS — T859XXD Unspecified complication of internal prosthetic device, implant and graft, subsequent encounter: Secondary | ICD-10-CM | POA: Diagnosis not present

## 2014-07-10 DIAGNOSIS — Z5112 Encounter for antineoplastic immunotherapy: Secondary | ICD-10-CM

## 2014-07-10 DIAGNOSIS — Y828 Other medical devices associated with adverse incidents: Secondary | ICD-10-CM | POA: Insufficient documentation

## 2014-07-10 DIAGNOSIS — Z4801 Encounter for change or removal of surgical wound dressing: Secondary | ICD-10-CM | POA: Insufficient documentation

## 2014-07-10 LAB — CBC WITH DIFFERENTIAL/PLATELET
BASO%: 0 % (ref 0.0–2.0)
Basophils Absolute: 0 10*3/uL (ref 0.0–0.1)
EOS%: 2.8 % (ref 0.0–7.0)
Eosinophils Absolute: 0.1 10*3/uL (ref 0.0–0.5)
HCT: 29.3 % — ABNORMAL LOW (ref 38.4–49.9)
HGB: 9 g/dL — ABNORMAL LOW (ref 13.0–17.1)
LYMPH%: 20.3 % (ref 14.0–49.0)
MCH: 24.9 pg — ABNORMAL LOW (ref 27.2–33.4)
MCHC: 30.7 g/dL — ABNORMAL LOW (ref 32.0–36.0)
MCV: 81.2 fL (ref 79.3–98.0)
MONO#: 0.3 10*3/uL (ref 0.1–0.9)
MONO%: 8.5 % (ref 0.0–14.0)
NEUT#: 2.2 10*3/uL (ref 1.5–6.5)
NEUT%: 68.4 % (ref 39.0–75.0)
Platelets: 125 10*3/uL — ABNORMAL LOW (ref 140–400)
RBC: 3.61 10*6/uL — AB (ref 4.20–5.82)
RDW: 18.2 % — ABNORMAL HIGH (ref 11.0–14.6)
WBC: 3.2 10*3/uL — AB (ref 4.0–10.3)
lymph#: 0.6 10*3/uL — ABNORMAL LOW (ref 0.9–3.3)
nRBC: 0 % (ref 0–0)

## 2014-07-10 LAB — COMPREHENSIVE METABOLIC PANEL (CC13)
ALK PHOS: 46 U/L (ref 40–150)
ALT: 11 U/L (ref 0–55)
AST: 11 U/L (ref 5–34)
Albumin: 3.8 g/dL (ref 3.5–5.0)
Anion Gap: 7 mEq/L (ref 3–11)
BUN: 15.6 mg/dL (ref 7.0–26.0)
CHLORIDE: 108 meq/L (ref 98–109)
CO2: 24 mEq/L (ref 22–29)
CREATININE: 1 mg/dL (ref 0.7–1.3)
Calcium: 9 mg/dL (ref 8.4–10.4)
EGFR: 87 mL/min/{1.73_m2} — ABNORMAL LOW (ref >90)
Glucose: 124 mg/dl (ref 70–140)
Potassium: 4.2 mEq/L (ref 3.5–5.1)
Sodium: 139 mEq/L (ref 136–145)
Total Bilirubin: 1.8 mg/dL — ABNORMAL HIGH (ref 0.20–1.20)
Total Protein: 5.7 g/dL — ABNORMAL LOW (ref 6.4–8.3)

## 2014-07-10 LAB — TECHNOLOGIST REVIEW

## 2014-07-10 MED ORDER — SODIUM CHLORIDE 0.9 % IV SOLN
Freq: Once | INTRAVENOUS | Status: AC
  Administered 2014-07-10: 11:00:00 via INTRAVENOUS
  Filled 2014-07-10: qty 4

## 2014-07-10 MED ORDER — SODIUM CHLORIDE 0.9 % IV SOLN: 250.0000 mL | Freq: Once | INTRAVENOUS | Status: DC

## 2014-07-10 MED ORDER — CARFILZOMIB CHEMO INJECTION 60 MG
36.0000 mg/m2 | Freq: Once | INTRAVENOUS | Status: AC
  Administered 2014-07-10: 72 mg via INTRAVENOUS
  Filled 2014-07-10: qty 36

## 2014-07-10 MED ORDER — SODIUM CHLORIDE 0.9 % IV SOLN
Freq: Once | INTRAVENOUS | Status: AC
  Administered 2014-07-10: 11:00:00 via INTRAVENOUS

## 2014-07-10 NOTE — Patient Instructions (Signed)
[facility] Discharge Instructions for Patients Receiving Chemotherapy  Today you received the following chemotherapy agent: Kyprolis.  To help prevent nausea and vomiting after your treatment, we encourage you to take your nausea medication as prescribed.   If you develop nausea and vomiting that is not controlled by your nausea medication, call the clinic.   BELOW ARE SYMPTOMS THAT SHOULD BE REPORTED IMMEDIATELY:  *FEVER GREATER THAN 100.5 F  *CHILLS WITH OR WITHOUT FEVER  NAUSEA AND VOMITING THAT IS NOT CONTROLLED WITH YOUR NAUSEA MEDICATION  *UNUSUAL SHORTNESS OF BREATH  *UNUSUAL BRUISING OR BLEEDING  TENDERNESS IN MOUTH AND THROAT WITH OR WITHOUT PRESENCE OF ULCERS  *URINARY PROBLEMS  *BOWEL PROBLEMS  UNUSUAL RASH Items with * indicate a potential emergency and should be followed up as soon as possible.  Feel free to call the clinic you have any questions or concerns. The clinic phone number is [phone].  Please show the CHEMO ALERT CARD at check-in to the Emergency Department and triage nurse.   

## 2014-07-10 NOTE — Progress Notes (Signed)
Patient seen today in follow-up from 07/03/14. He has had his right port a catheter removed secondary to infection. The patient denies any fever or chills. He denies any drainage at port wound site. He has been changing his dressing daily with saline and gauze packing.   PE: right chest wall wound without erythema or induration. No drainage seen, non-tender with well healing granulation tissue.   Plan: Continue saline gauze packing and dressing changes daily, follow-up in IR next week for site check and dressing change. Patient instructed to call if he develops any drainage at site, tenderness, extending redness, fever or chills. He states understanding of the above plan.    Tsosie Billing PA-C Interventional Radiology  07/10/14  2:14 PM

## 2014-07-10 NOTE — Progress Notes (Signed)
Bilirubin 1.8 today, improved from 1.83 on last 2 consecutive readings. OK to treat per Dr. Julien Nordmann. Patient took decadron at home, dose not need dose in infusion room today.

## 2014-07-11 ENCOUNTER — Ambulatory Visit (HOSPITAL_BASED_OUTPATIENT_CLINIC_OR_DEPARTMENT_OTHER): Payer: Medicare Other

## 2014-07-11 VITALS — BP 140/76 | HR 61 | Temp 98.0°F | Resp 20

## 2014-07-11 DIAGNOSIS — C9002 Multiple myeloma in relapse: Secondary | ICD-10-CM | POA: Diagnosis not present

## 2014-07-11 DIAGNOSIS — Z5112 Encounter for antineoplastic immunotherapy: Secondary | ICD-10-CM

## 2014-07-11 DIAGNOSIS — C9000 Multiple myeloma not having achieved remission: Secondary | ICD-10-CM

## 2014-07-11 MED ORDER — SODIUM CHLORIDE 0.9 % IV SOLN
Freq: Once | INTRAVENOUS | Status: AC
  Administered 2014-07-11: 09:00:00 via INTRAVENOUS
  Filled 2014-07-11: qty 4

## 2014-07-11 MED ORDER — SODIUM CHLORIDE 0.9 % IV SOLN
250.0000 mL | Freq: Once | INTRAVENOUS | Status: AC
  Administered 2014-07-11: 250 mL via INTRAVENOUS

## 2014-07-11 MED ORDER — DEXTROSE 5 % IV SOLN
36.0000 mg/m2 | Freq: Once | INTRAVENOUS | Status: AC
  Administered 2014-07-11: 72 mg via INTRAVENOUS
  Filled 2014-07-11: qty 36

## 2014-07-11 NOTE — Progress Notes (Signed)
Pt declined AVS.

## 2014-07-11 NOTE — Patient Instructions (Signed)
[facility] Discharge Instructions for Patients Receiving Chemotherapy  Today you received the following chemotherapy agents:  Kyprolis  To help prevent nausea and vomiting after your treatment, we encourage you to take your nausea medication as ordered per MD.   If you develop nausea and vomiting that is not controlled by your nausea medication, call the clinic.   BELOW ARE SYMPTOMS THAT SHOULD BE REPORTED IMMEDIATELY:  *FEVER GREATER THAN 100.5 F  *CHILLS WITH OR WITHOUT FEVER  NAUSEA AND VOMITING THAT IS NOT CONTROLLED WITH YOUR NAUSEA MEDICATION  *UNUSUAL SHORTNESS OF BREATH  *UNUSUAL BRUISING OR BLEEDING  TENDERNESS IN MOUTH AND THROAT WITH OR WITHOUT PRESENCE OF ULCERS  *URINARY PROBLEMS  *BOWEL PROBLEMS  UNUSUAL RASH Items with * indicate a potential emergency and should be followed up as soon as possible.  Feel free to call the clinic you have any questions or concerns. The clinic phone number is [phone].  Please show the CHEMO ALERT CARD at check-in to the Emergency Department and triage nurse.   

## 2014-07-13 ENCOUNTER — Other Ambulatory Visit: Payer: Self-pay | Admitting: *Deleted

## 2014-07-13 ENCOUNTER — Other Ambulatory Visit: Payer: Self-pay | Admitting: Physician Assistant

## 2014-07-13 MED ORDER — VALACYCLOVIR HCL 500 MG PO TABS: ORAL_TABLET | ORAL | Status: DC

## 2014-07-13 NOTE — Progress Notes (Signed)
Patient ID: Eddie Thomas, male   DOB: [birth year], 69 y.o.   MRN: [id number]   [facility] for PA for pantoprazole 40 mg twice daily.  Pantoprazole has been approved and the approval dates are 05/14/14-07/13/15.

## 2014-07-14 ENCOUNTER — Other Ambulatory Visit: Payer: Self-pay | Admitting: *Deleted

## 2014-07-14 DIAGNOSIS — C9000 Multiple myeloma not having achieved remission: Secondary | ICD-10-CM

## 2014-07-17 ENCOUNTER — Other Ambulatory Visit (HOSPITAL_COMMUNITY): Payer: Self-pay | Admitting: Radiology

## 2014-07-17 ENCOUNTER — Ambulatory Visit (HOSPITAL_BASED_OUTPATIENT_CLINIC_OR_DEPARTMENT_OTHER)
Admission: RE | Admit: 2014-07-17 | Discharge: 2014-07-17 | Disposition: A | Discharge status: Home / Self Care | Payer: Medicare Other | Source: Ambulatory Visit | Attending: Nurse Practitioner | Admitting: Nurse Practitioner

## 2014-07-17 ENCOUNTER — Ambulatory Visit (HOSPITAL_BASED_OUTPATIENT_CLINIC_OR_DEPARTMENT_OTHER): Payer: Medicare Other

## 2014-07-17 ENCOUNTER — Encounter: Payer: Self-pay | Admitting: Nurse Practitioner

## 2014-07-17 ENCOUNTER — Ambulatory Visit (HOSPITAL_BASED_OUTPATIENT_CLINIC_OR_DEPARTMENT_OTHER): Payer: Medicare Other | Admitting: Nurse Practitioner

## 2014-07-17 ENCOUNTER — Other Ambulatory Visit (HOSPITAL_BASED_OUTPATIENT_CLINIC_OR_DEPARTMENT_OTHER): Payer: Medicare Other

## 2014-07-17 ENCOUNTER — Ambulatory Visit (HOSPITAL_COMMUNITY)
Admission: RE | Admit: 2014-07-17 | Discharge: 2014-07-17 | Disposition: A | Discharge status: Home / Self Care | Payer: Medicare Other | Source: Ambulatory Visit | Attending: Radiology | Admitting: Radiology

## 2014-07-17 VITALS — BP 145/60 | HR 60 | Temp 98.3°F | Resp 18

## 2014-07-17 DIAGNOSIS — D6481 Anemia due to antineoplastic chemotherapy: Secondary | ICD-10-CM | POA: Diagnosis not present

## 2014-07-17 DIAGNOSIS — T798XXD Other early complications of trauma, subsequent encounter: Secondary | ICD-10-CM

## 2014-07-17 DIAGNOSIS — M7989 Other specified soft tissue disorders: Secondary | ICD-10-CM | POA: Insufficient documentation

## 2014-07-17 DIAGNOSIS — Z5112 Encounter for antineoplastic immunotherapy: Secondary | ICD-10-CM | POA: Diagnosis present

## 2014-07-17 DIAGNOSIS — C9000 Multiple myeloma not having achieved remission: Secondary | ICD-10-CM | POA: Insufficient documentation

## 2014-07-17 DIAGNOSIS — E785 Hyperlipidemia, unspecified: Secondary | ICD-10-CM | POA: Diagnosis not present

## 2014-07-17 DIAGNOSIS — I803 Phlebitis and thrombophlebitis of lower extremities, unspecified: Secondary | ICD-10-CM | POA: Insufficient documentation

## 2014-07-17 DIAGNOSIS — T451X5A Adverse effect of antineoplastic and immunosuppressive drugs, initial encounter: Secondary | ICD-10-CM

## 2014-07-17 DIAGNOSIS — Z86010 Personal history of colonic polyps: Secondary | ICD-10-CM | POA: Insufficient documentation

## 2014-07-17 DIAGNOSIS — I10 Essential (primary) hypertension: Secondary | ICD-10-CM | POA: Insufficient documentation

## 2014-07-17 DIAGNOSIS — I82409 Acute embolism and thrombosis of unspecified deep veins of unspecified lower extremity: Secondary | ICD-10-CM | POA: Insufficient documentation

## 2014-07-17 DIAGNOSIS — C9002 Multiple myeloma in relapse: Secondary | ICD-10-CM | POA: Diagnosis present

## 2014-07-17 DIAGNOSIS — R600 Localized edema: Secondary | ICD-10-CM

## 2014-07-17 DIAGNOSIS — T80219D Unspecified infection due to central venous catheter, subsequent encounter: Secondary | ICD-10-CM

## 2014-07-17 DIAGNOSIS — E119 Type 2 diabetes mellitus without complications: Secondary | ICD-10-CM | POA: Diagnosis not present

## 2014-07-17 DIAGNOSIS — I82890 Acute embolism and thrombosis of other specified veins: Secondary | ICD-10-CM

## 2014-07-17 DIAGNOSIS — I82402 Acute embolism and thrombosis of unspecified deep veins of left lower extremity: Secondary | ICD-10-CM

## 2014-07-17 DIAGNOSIS — I82432 Acute embolism and thrombosis of left popliteal vein: Secondary | ICD-10-CM | POA: Diagnosis present

## 2014-07-17 LAB — CBC WITH DIFFERENTIAL/PLATELET
BASO%: 0.5 % (ref 0.0–2.0)
Basophils Absolute: 0 10*3/uL (ref 0.0–0.1)
EOS ABS: 0.3 10*3/uL (ref 0.0–0.5)
EOS%: 5.3 % (ref 0.0–7.0)
HCT: 28.9 % — ABNORMAL LOW (ref 38.4–49.9)
HGB: 9.1 g/dL — ABNORMAL LOW (ref 13.0–17.1)
LYMPH%: 15.4 % (ref 14.0–49.0)
MCH: 25.5 pg — AB (ref 27.2–33.4)
MCHC: 31.3 g/dL — AB (ref 32.0–36.0)
MCV: 81.5 fL (ref 79.3–98.0)
MONO#: 0.7 10*3/uL (ref 0.1–0.9)
MONO%: 14.3 % — AB (ref 0.0–14.0)
NEUT#: 3.2 10*3/uL (ref 1.5–6.5)
NEUT%: 64.5 % (ref 39.0–75.0)
PLATELETS: 160 10*3/uL (ref 140–400)
RBC: 3.55 10*6/uL — AB (ref 4.20–5.82)
RDW: 18.1 % — AB (ref 11.0–14.6)
WBC: 4.9 10*3/uL (ref 4.0–10.3)
lymph#: 0.8 10*3/uL — ABNORMAL LOW (ref 0.9–3.3)

## 2014-07-17 LAB — COMPREHENSIVE METABOLIC PANEL (CC13)
ALK PHOS: 39 U/L — AB (ref 40–150)
ALT: 15 U/L (ref 0–55)
AST: 12 U/L (ref 5–34)
Albumin: 3.6 g/dL (ref 3.5–5.0)
Anion Gap: 7 mEq/L (ref 3–11)
BUN: 13.9 mg/dL (ref 7.0–26.0)
CALCIUM: 8.6 mg/dL (ref 8.4–10.4)
CO2: 23 mEq/L (ref 22–29)
Chloride: 109 mEq/L (ref 98–109)
Creatinine: 0.9 mg/dL (ref 0.7–1.3)
EGFR: >90 mL/min/{1.73_m2} (ref >90)
Glucose: 123 mg/dl (ref 70–140)
Potassium: 4 mEq/L (ref 3.5–5.1)
Sodium: 138 mEq/L (ref 136–145)
Total Bilirubin: 2.15 mg/dL — ABNORMAL HIGH (ref 0.20–1.20)
Total Protein: 5.4 g/dL — ABNORMAL LOW (ref 6.4–8.3)

## 2014-07-17 MED ORDER — SODIUM CHLORIDE 0.9 % IV SOLN
Freq: Once | INTRAVENOUS | Status: AC
  Administered 2014-07-17: 10:00:00 via INTRAVENOUS
  Filled 2014-07-17: qty 4

## 2014-07-17 MED ORDER — DEXTROSE 5 % IV SOLN
36.0000 mg/m2 | Freq: Once | INTRAVENOUS | Status: AC
  Administered 2014-07-17: 72 mg via INTRAVENOUS
  Filled 2014-07-17: qty 36

## 2014-07-17 MED ORDER — RIVAROXABAN (XARELTO) VTE STARTER PACK (15 & 20 MG): ORAL_TABLET | ORAL | Status: DC

## 2014-07-17 MED ORDER — SODIUM CHLORIDE 0.9 % IV SOLN
Freq: Once | INTRAVENOUS | Status: AC
  Administered 2014-07-17: 10:00:00 via INTRAVENOUS

## 2014-07-17 NOTE — Assessment & Plan Note (Signed)
Bilirubin continues to improve; and is currently elevated from 1.80 up to 2.15.  Will continue to monitor closely.

## 2014-07-17 NOTE — Progress Notes (Signed)
Patient notes lower extremity swelling on Left side, none to R. Denies calf pain or tenderness. Dr. Julien Nordmann and Selena Lesser, NP notified. NP at chairside to assess. OK to treat with chemo, patient to have bilat LE doppler after treatment.   Bilirubin 2.15, Dr. Julien Nordmann aware and verbalizes OK to treat.

## 2014-07-17 NOTE — Patient Instructions (Signed)
[facility] Discharge Instructions for Patients Receiving Chemotherapy  Today you received the following chemotherapy agent: Kyprolis.  To help prevent nausea and vomiting after your treatment, we encourage you to take your nausea medication as prescribed.   If you develop nausea and vomiting that is not controlled by your nausea medication, call the clinic.   BELOW ARE SYMPTOMS THAT SHOULD BE REPORTED IMMEDIATELY:  *FEVER GREATER THAN 100.5 F  *CHILLS WITH OR WITHOUT FEVER  NAUSEA AND VOMITING THAT IS NOT CONTROLLED WITH YOUR NAUSEA MEDICATION  *UNUSUAL SHORTNESS OF BREATH  *UNUSUAL BRUISING OR BLEEDING  TENDERNESS IN MOUTH AND THROAT WITH OR WITHOUT PRESENCE OF ULCERS  *URINARY PROBLEMS  *BOWEL PROBLEMS  UNUSUAL RASH Items with * indicate a potential emergency and should be followed up as soon as possible.  Feel free to call the clinic you have any questions or concerns. The clinic phone number is [phone].  Please show the CHEMO ALERT CARD at check-in to the Emergency Department and triage nurse.   

## 2014-07-17 NOTE — Assessment & Plan Note (Signed)
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  Patient is now receiving all chemotherapy peripherally.  Patient states that his previous Port-A-Cath insertion site has healed well.  He has an appointment to follow up with interventional radiology today for follow-up.

## 2014-07-17 NOTE — Assessment & Plan Note (Addendum)
Patient reports new onset left leg edema for the past few days.  He denies any known injury or trauma to his leg.  He also denies any chest pain, chest pressure, shortness of breath, or pain with inspiration.  On exam.-Patient does appear to have marked edema to his left lower extremity only.  No evidence of injury or trauma.  Doppler ultrasound of the left lower extremity was positive for DVT.  Will prescribe a Xarelto starter pack for treatment of DVT.

## 2014-07-17 NOTE — Progress Notes (Signed)
SYMPTOM MANAGEMENT CLINIC   HPI: Eddie Thomas 69 y.o. male diagnosed with multiple myeloma.  Currently undergoing Kyprolis, Pomalyst oral therapy, and Zometa therapy.  Patient reports new onset left lower extremity edema within the past few days.  He denies any known injury or trauma to his leg.  He also denies any chest pain, chest pressure, shortness breath, or pain with respiration.  He denies any recent fevers or chills.  Patient also reports that his previous Port-A-Cath site has healed well.  He has plans to follow-up with interventional radiology today.  HPI  ROS  Past Medical History  Diagnosis Date  . Multiple myeloma   . Diabetes mellitus 07/03/2011  . Hypertension 07/03/2011  . Hyperlipidemia 07/03/2011  . Colon polyps 2012  . Gastric ulcer     Past Surgical History  Procedure Laterality Date  . Limbal stem cell transplant    . Humerus fracture surgery      right  . 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    has No Known Allergies.    Medication List       This list is accurate as of: 07/17/14  3:53 PM.  Always use your most recent med list.               dexamethasone 4 MG tablet  Commonly known as:  DECADRON  TAKE FIVE TABLETS BY MOUTH EVERY WEEK START WITH THE FIRST DOSE OF CHEMOTHERAPY     lisinopril 10 MG tablet  Commonly known as:  PRINIVIL,ZESTRIL  Take 5 mg by mouth every evening.     oxyCODONE-acetaminophen 5-325 MG per tablet  Commonly known as:  PERCOCET/ROXICET  Take 1 tablet by mouth every 6 (six) hours as needed for severe pain.     pantoprazole 40 MG tablet  Commonly known as:  PROTONIX  Take 1 tablet (40 mg total) by mouth 2 (two) times daily.     pioglitazone 15 MG tablet  Commonly known as:  ACTOS  Take 15 mg by mouth daily.     pomalidomide 4 MG capsule  Commonly known as:  POMALYST  Take 1 capsule (4 mg total) by mouth daily. Take with water on days 1-21. Repeat every 28 days.     prochlorperazine 10 MG tablet  Commonly known as:  COMPAZINE  Take 10 mg by mouth every 6 (six) hours as needed for nausea or vomiting.     Rivaroxaban 15 & 20 MG Tbpk  Commonly known as:  XARELTO STARTER PACK  Take as directed on package: Start with one 54m tablet by mouth twice a day with food. On Day 22, switch to one 270mtablet once a day with food.     simvastatin 10 MG tablet  Commonly known as:  ZOCOR  Take 10 mg by mouth at bedtime.     temazepam 15 MG capsule  Commonly known as:  RESTORIL  Take 15 mg by mouth at bedtime as needed for sleep.     valACYclovir 500 MG tablet  Commonly known as:  VALTREX  TAKE ONE CAPLET BY MOUTH ONCE DAILY     VIAGRA 50 MG tablet  Generic drug:  sildenafil  Take 50 mg  by mouth daily as needed for erectile dysfunction.     Vitamin D-3 5000 UNITS Tabs  Take 1 tablet by mouth daily.         PHYSICAL EXAMINATION  Oncology Vitals 07/17/2014 07/11/2014 07/10/2014 07/04/2014 07/03/2014 06/21/2014 06/20/2014  Height - - - - 180 cm - 180 cm  Weight - - - - 78.744 kg - 79.379 kg  Weight (lbs) - - - - 173 lbs 10 oz - 175 lbs  BMI (kg/m2) - - - - 24.21 kg/m2 - 24.41 kg/m2  Temp 98.3 98 97.2 98 98.4 97.1 97.6  Pulse 60 61 69 58 69 60 62  Resp 18 20 18 16 18 18 16   Resp (Historical as of 08/21/11) - - - - - - -  SpO2 100 100 100 100 100 100 100  BSA (m2) - - - - 1.99 m2 - 1.99 m2   BP Readings from Last 3 Encounters:  07/17/14 145/60  07/11/14 140/76  07/10/14 137/59    Physical Exam  Constitutional: He is oriented to person, place, and time and well-developed, well-nourished, and in  no distress.  HENT:  Head: Normocephalic and atraumatic.  Eyes: Conjunctivae and EOM are normal. Pupils are equal, round, and reactive to light. Right eye exhibits no discharge. Left eye exhibits no discharge. No scleral icterus.  Neck: Normal range of motion.  Pulmonary/Chest: Effort normal. No respiratory distress.  Musculoskeletal: Normal range of motion. He exhibits edema. He exhibits no tenderness.  Left lower extremity with increased edema to the entire leg.  No obvious injury or trauma noted.  Neurological: He is alert and oriented to person, place, and time. Gait normal.  Skin: Skin is warm and dry. No rash noted. No erythema. No pallor.        Psychiatric: Affect normal.  Nursing note and vitals reviewed.   LABORATORY DATA:. Appointment on 07/17/2014  Component Date Value Ref Range Status  . WBC 07/17/2014 4.9  4.0 - 10.3 10e3/uL Final  . NEUT# 07/17/2014 3.2  1.5 - 6.5 10e3/uL Final  . HGB 07/17/2014 9.1* 13.0 - 17.1 g/dL Final  . HCT 07/17/2014 28.9* 38.4 - 49.9 % Final  . Platelets 07/17/2014 160  140 - 400 10e3/uL Final  . MCV 07/17/2014 81.5  79.3 - 98.0 fL Final  . MCH 07/17/2014 25.5* 27.2 - 33.4 pg Final  . MCHC 07/17/2014 31.3* 32.0 - 36.0 g/dL Final  . RBC 07/17/2014 3.55* 4.20 - 5.82 10e6/uL Final  . RDW 07/17/2014 18.1* 11.0 - 14.6 % Final  . lymph# 07/17/2014 0.8* 0.9 - 3.3 10e3/uL Final  . MONO# 07/17/2014 0.7  0.1 - 0.9 10e3/uL Final  . Eosinophils Absolute 07/17/2014 0.3  0.0 - 0.5 10e3/uL Final  . Basophils Absolute 07/17/2014 0.0  0.0 - 0.1 10e3/uL Final  . NEUT% 07/17/2014 64.5  39.0 - 75.0 % Final  . LYMPH% 07/17/2014 15.4  14.0 - 49.0 % Final  . MONO% 07/17/2014 14.3* 0.0 - 14.0 % Final  . EOS% 07/17/2014 5.3  0.0 - 7.0 % Final  . BASO% 07/17/2014 0.5  0.0 - 2.0 % Final  . Sodium 07/17/2014 138  136 - 145 mEq/L Final  . Potassium 07/17/2014 4.0  3.5 - 5.1 mEq/L Final  . Chloride 07/17/2014 109  98 - 109 mEq/L Final  . CO2 07/17/2014 23  22 -  29 mEq/L Final  . Glucose 07/17/2014 123  70 - 140 mg/dl Final  . BUN 07/17/2014 13.9  7.0 - 26.0 mg/dL Final  .  Creatinine 07/17/2014 0.9  0.7 - 1.3 mg/dL Final  . Total Bilirubin 07/17/2014 2.15* 0.20 - 1.20 mg/dL Final  . Alkaline Phosphatase 07/17/2014 39* 40 - 150 U/L Final  . AST 07/17/2014 12  5 - 34 U/L Final  . ALT 07/17/2014 15  0 - 55 U/L Final  . Total Protein 07/17/2014 5.4* 6.4 - 8.3 g/dL Final  . Albumin 07/17/2014 3.6  3.5 - 5.0 g/dL Final  . Calcium 07/17/2014 8.6  8.4 - 10.4 mg/dL Final  . Anion Gap 07/17/2014 7  3 - 11 mEq/L Final  . EGFR 07/17/2014 >90  >90 ml/min/1.73 m2 Final   eGFR is calculated using the CKD-EPI Creatinine Equation (2009)   Doppler US: VASCULAR LAB PRELIMINARY PRELIMINARY PRELIMINARY PRELIMINARY  Bilateral lower extremity venous duplex completed.   Preliminary report: Right: No evidence of DVT, superficial thrombosis, or Baker's cyst. Left: DVT noted in the distal FV, popliteal vein, PTV, and peroneal v. No evidence of superficial thrombosis. No Baker's cyst.   RADIOGRAPHIC STUDIES: No results found.  ASSESSMENT/PLAN:    Antineoplastic chemotherapy induced anemia Chemotherapy therapy induced anemia; with hemoglobin slightly improved from 9.0 to 9.1 .  Patient denies any worsening issues with either fatigue or shortness of breath with exertion.  Will continue to monitor closely.    Hyperbilirubinemia Bilirubin continues to improve; and is currently elevated from 1.80 up to 2.15.  Will continue to monitor closely.    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  Patient is now receiving all chemotherapy peripherally.  Patient states that his previous Port-A-Cath insertion site has healed well.  He has an appointment to follow up with interventional radiology today for follow-up.   Multiple myeloma Patient appears to be tolerating his  Kyprolis chemotherapy fairly well.  He also continues to take his oral Pomalyst as directed.  Last Zometa infusion was 07/03/2014.  His Zometa infusions are scheduled on an every eight-week basis.    Blood count remains stable; the exception of a mild anemia with hemoglobin 9.1.  Patient is scheduled to return for his next chemotherapy on 07/18/2014.  He is scheduled for his next labs in follow-up visit on 07/31/2014.      DVT (deep venous thrombosis) Patient reports new onset left leg edema for the past few days.  He denies any known injury or trauma to his leg.  He also denies any chest pain, chest pressure, shortness of breath, or pain with inspiration.  On exam.-Patient does appear to have marked edema to his left lower extremity only.  No evidence of injury or trauma.  Doppler ultrasound of the left lower extremity was positive for DVT.  Will prescribe a Xarelto starter pack for treatment of DVT.   Patient stated understanding of all instructions; and was in agreement with this plan of care. The patient knows to call the clinic with any problems, questions or concerns.   This was a shared visit with Dr. Julien Nordmann today.  Total time spent with patient was 25 minutes;  with greater than 75 percent of that time spent in face to face counseling regarding patient's symptoms,  and coordination of care and follow up.  Disclaimer: This note was dictated with voice recognition software. Similar sounding words can inadvertently be transcribed and may not be corrected upon review.   Drue Second, NP 07/17/2014   ADDENDUM: Hematology/Oncology Attending:  I had a face to face encounter with the patient.  I recommended his care plan. This is a very pleasant 69 years old African-American male with history of multiple myeloma who is currently undergoing treatment with Keppra progress, Olin Hauser stand Decadron and tolerating his treatment fairly well.  He presented today to the symptom management clinic  complaining of new swelling of the left lower extremity.  I recommended for the patient to have ultrasound Doppler of the lower extremity to rule out DVT and unfortunately he was confirmed to have deep venous thrombosis of the left lower extremity.  We will start the patient on treatment with Xarelto 15 mg by mouth twice a day for 3 weeks followed by 20 mg by mouth daily after the induction dose.  The patient will resume has systemic chemotherapy as previously scheduled.  He was advised to call immediately if he has any concerning symptoms in the interval.  Disclaimer: This note was dictated with voice recognition software. Similar sounding words can inadvertently be transcribed and may be missed upon review. Eilleen Kempf., MD 07/19/2014

## 2014-07-17 NOTE — Assessment & Plan Note (Signed)
Chemotherapy therapy induced anemia; with hemoglobin slightly improved from 9.0 to 9.1 .  Patient denies any worsening issues with either fatigue or shortness of breath with exertion.  Will continue to monitor closely.

## 2014-07-17 NOTE — Assessment & Plan Note (Signed)
Patient appears to be tolerating his Kyprolis chemotherapy fairly well.  He also continues to take his oral Pomalyst as directed.  Last Zometa infusion was 07/03/2014.  His Zometa infusions are scheduled on an every eight-week basis.    Blood count remains stable; the exception of a mild anemia with hemoglobin 9.1.  Patient is scheduled to return for his next chemotherapy on 07/18/2014.  He is scheduled for his next labs in follow-up visit on 07/31/2014.

## 2014-07-17 NOTE — Assessment & Plan Note (Deleted)
Patient reports new onset left leg edema for the past few days.  He denies any known injury or trauma to his leg.  He also denies any chest pain, chest pressure, shortness of breath, or pain with inspiration.  On exam.-Patient does appear to have marked edema to his left lower extremity only.  No evidence of injury or trauma.  Will order a Doppler ultrasound to evaluate for possible DVT/superficial thrombus____________________________________

## 2014-07-17 NOTE — Progress Notes (Signed)
VASCULAR LAB PRELIMINARY  PRELIMINARY  PRELIMINARY  PRELIMINARY  Bilateral lower extremity venous duplex  completed.    Preliminary report:  Right:  No evidence of DVT, superficial thrombosis, or Baker's cyst.  Left: DVT noted in the distal FV, popliteal vein, PTV, and peroneal v.  No evidence of superficial thrombosis.  No Baker's cyst.   Stacia Feazell, RVT 07/17/2014, 12:44 PM

## 2014-07-18 ENCOUNTER — Ambulatory Visit (HOSPITAL_BASED_OUTPATIENT_CLINIC_OR_DEPARTMENT_OTHER): Payer: Medicare Other

## 2014-07-18 VITALS — BP 142/70 | HR 55 | Temp 98.3°F | Resp 16

## 2014-07-18 DIAGNOSIS — C9002 Multiple myeloma in relapse: Secondary | ICD-10-CM

## 2014-07-18 DIAGNOSIS — Z5112 Encounter for antineoplastic immunotherapy: Secondary | ICD-10-CM | POA: Diagnosis present

## 2014-07-18 DIAGNOSIS — C9000 Multiple myeloma not having achieved remission: Secondary | ICD-10-CM

## 2014-07-18 MED ORDER — DEXTROSE 5 % IV SOLN
36.0000 mg/m2 | Freq: Once | INTRAVENOUS | Status: AC
  Administered 2014-07-18: 72 mg via INTRAVENOUS
  Filled 2014-07-18: qty 36

## 2014-07-18 MED ORDER — SODIUM CHLORIDE 0.9 % IV SOLN
Freq: Once | INTRAVENOUS | Status: AC
  Administered 2014-07-18: 09:00:00 via INTRAVENOUS

## 2014-07-18 MED ORDER — DEXAMETHASONE SODIUM PHOSPHATE 100 MG/10ML IJ SOLN
Freq: Once | INTRAMUSCULAR | Status: AC
  Administered 2014-07-18: 09:00:00 via INTRAVENOUS
  Filled 2014-07-18: qty 4

## 2014-07-18 NOTE — Patient Instructions (Signed)
[facility] Discharge Instructions for Patients Receiving Chemotherapy  Today you received the following chemotherapy agent: Kyprolis.  To help prevent nausea and vomiting after your treatment, we encourage you to take your nausea medication as prescribed.   If you develop nausea and vomiting that is not controlled by your nausea medication, call the clinic.   BELOW ARE SYMPTOMS THAT SHOULD BE REPORTED IMMEDIATELY:  *FEVER GREATER THAN 100.5 F  *CHILLS WITH OR WITHOUT FEVER  NAUSEA AND VOMITING THAT IS NOT CONTROLLED WITH YOUR NAUSEA MEDICATION  *UNUSUAL SHORTNESS OF BREATH  *UNUSUAL BRUISING OR BLEEDING  TENDERNESS IN MOUTH AND THROAT WITH OR WITHOUT PRESENCE OF ULCERS  *URINARY PROBLEMS  *BOWEL PROBLEMS  UNUSUAL RASH Items with * indicate a potential emergency and should be followed up as soon as possible.  Feel free to call the clinic you have any questions or concerns. The clinic phone number is [phone].  Please show the CHEMO ALERT CARD at check-in to the Emergency Department and triage nurse.   

## 2014-07-18 NOTE — Progress Notes (Signed)
Patient states he is drinking plenty of fluids and would prefer not to get the extra 250 NS with treatment today.  Discussed blood clot with patient and reviewed preventative measures.

## 2014-07-19 ENCOUNTER — Other Ambulatory Visit: Payer: Self-pay | Admitting: Medical Oncology

## 2014-07-19 DIAGNOSIS — C9000 Multiple myeloma not having achieved remission: Secondary | ICD-10-CM

## 2014-07-19 MED ORDER — POMALIDOMIDE 4 MG PO CAPS
4.0000 mg | ORAL_CAPSULE | Freq: Every day | ORAL | Status: DC
Start: 2014-07-19 — End: 2014-08-10

## 2014-07-25 ENCOUNTER — Telehealth: Payer: Self-pay | Admitting: Internal Medicine

## 2014-07-25 DIAGNOSIS — T80219A Unspecified infection due to central venous catheter, initial encounter: Secondary | ICD-10-CM | POA: Insufficient documentation

## 2014-07-25 NOTE — Progress Notes (Signed)
Referring Physician(s): Watts,John  Subjective:  Eddie Thomas is here today for recheck of his port a cath site.  He states it is looking better. He is still using normal saline moistened gauze to loosely pack the area daily.  Allergies: Review of patient's allergies indicates no known allergies.  Medications: Prior to Admission medications   Medication Sig Start Date End Date Taking? Authorizing Provider  Cholecalciferol (VITAMIN D-3) 5000 UNITS TABS Take 1 tablet by mouth daily.    Historical Provider, MD  dexamethasone (DECADRON) 4 MG tablet TAKE FIVE TABLETS BY MOUTH EVERY WEEK START WITH THE FIRST DOSE OF CHEMOTHERAPY 05/15/14   Adrena E Johnson, PA-C  lisinopril (PRINIVIL,ZESTRIL) 10 MG tablet Take 5 mg by mouth every evening.     Historical Provider, MD  oxyCODONE-acetaminophen (PERCOCET/ROXICET) 5-325 MG per tablet Take 1 tablet by mouth every 6 (six) hours as needed for severe pain. Patient not taking: Reported on 07/03/2014 07/21/13   Curt Bears, MD  pantoprazole (PROTONIX) 40 MG tablet Take 1 tablet (40 mg total) by mouth 2 (two) times daily. 09/22/13   Ladene Artist, MD  pioglitazone (ACTOS) 15 MG tablet Take 15 mg by mouth daily. 08/19/12   Historical Provider, MD  pomalidomide (POMALYST) 4 MG capsule Take 1 capsule (4 mg total) by mouth daily. Take with water on days 1-21. Repeat every 28 days.authorization number [id number] on 07/19/14 07/19/14   Curt Bears, MD  prochlorperazine (COMPAZINE) 10 MG tablet Take 10 mg by mouth every 6 (six) hours as needed for nausea or vomiting.    Historical Provider, MD  Rivaroxaban (XARELTO STARTER PACK) 15 & 20 MG TBPK Take as directed on package: Start with one 15mg  tablet by mouth twice a day with food. On Day 22, switch to one 20mg  tablet once a day with food. 07/17/14   Susanne Borders, NP  simvastatin (ZOCOR) 10 MG tablet Take 10 mg by mouth at bedtime.      Historical Provider, MD  temazepam (RESTORIL) 15 MG capsule Take 15 mg by  mouth at bedtime as needed for sleep.    Historical Provider, MD  valACYclovir (VALTREX) 500 MG tablet TAKE ONE CAPLET BY MOUTH ONCE DAILY 07/13/14   Adrena E Johnson, PA-C  VIAGRA 50 MG tablet Take 50 mg by mouth daily as needed for erectile dysfunction.  02/23/13   Historical Provider, MD     Vital Signs: There were no vitals taken for this visit.  Physical Exam   Awake and Alert  The open wound where the Port was removed is improving nicely.  There is pink healthy tissue in the wound bed. No signs of infection.  Imaging: No results found.  Labs:  CBC:  Recent Labs  06/20/14 0920 07/03/14 0930 07/10/14 0943 07/17/14 0812  WBC 3.8* 2.9* 3.2* 4.9  HGB 9.0* 8.6* 9.0* 9.1*  HCT 28.4* 28.1* 29.3* 28.9*  PLT 170 206 125* 160    COAGS: No results for input(s): INR, APTT in the last 8760 hours.  BMP:  Recent Labs  08/30/13 0935  06/20/14 0920 07/03/14 0931 07/10/14 0943 07/17/14 0812  NA 139  < > 138 139 139 138  K 4.1  < > 4.4 4.3 4.2 4.0  CL 101  --   --   --   --   --   CO2 26  < > 23 21* 24 23  GLUCOSE 154*  < > 155* 173* 124 123  BUN 11  < > 12.2 10.7 15.6  13.9  CALCIUM 9.2  < > 8.9 9.2 9.0 8.6  CREATININE 1.03  < > 1.0 1.1 1.0 0.9  < > = values in this interval not displayed.  LIVER FUNCTION TESTS:  Recent Labs  06/20/14 0920 07/03/14 0931 07/10/14 0943 07/17/14 0812  BILITOT 1.83* 1.83* 1.80* 2.15*  AST 13 12 11 12   ALT 14 12 11 15   ALKPHOS 50 44 46 39*  PROT 5.8* 5.7* 5.7* 5.4*  ALBUMIN 3.6 3.7 3.8 3.6    Assessment and Plan:  Infected Port A Cath, s/p removal Continue normal saline dressing daily  Return in 2 weeks for recheck.  Signed: Murrell Redden PA-C 07/25/2014, 2:41 PM   I spent a total of 15 Minutes in face to face in clinical consultation/evaluation, greater than 50% of which was counseling/coordinating care for open wound at port a cath removal site.

## 2014-07-25 NOTE — Addendum Note (Signed)
Encounter addended by: Ardis Rowan, PA-C on: 07/25/2014  2:51 PM<BR>     Documentation filed: Notes Section, Clinical Notes, Problem List, Charges VN

## 2014-07-25 NOTE — Telephone Encounter (Signed)
pt called to sched chemo...done.[name]

## 2014-07-31 ENCOUNTER — Ambulatory Visit (HOSPITAL_BASED_OUTPATIENT_CLINIC_OR_DEPARTMENT_OTHER): Payer: Medicare Other

## 2014-07-31 ENCOUNTER — Encounter: Payer: Self-pay | Admitting: Internal Medicine

## 2014-07-31 ENCOUNTER — Ambulatory Visit (HOSPITAL_COMMUNITY)
Admission: RE | Admit: 2014-07-31 | Discharge: 2014-07-31 | Disposition: A | Discharge status: Home / Self Care | Payer: Medicare Other | Source: Ambulatory Visit | Attending: Radiology | Admitting: Radiology

## 2014-07-31 ENCOUNTER — Other Ambulatory Visit (HOSPITAL_COMMUNITY): Payer: Self-pay | Admitting: Radiology

## 2014-07-31 ENCOUNTER — Other Ambulatory Visit: Payer: Self-pay | Admitting: *Deleted

## 2014-07-31 ENCOUNTER — Ambulatory Visit: Payer: Medicare Other

## 2014-07-31 ENCOUNTER — Telehealth: Payer: Self-pay | Admitting: *Deleted

## 2014-07-31 ENCOUNTER — Telehealth: Payer: Self-pay | Admitting: Internal Medicine

## 2014-07-31 ENCOUNTER — Other Ambulatory Visit (HOSPITAL_BASED_OUTPATIENT_CLINIC_OR_DEPARTMENT_OTHER): Payer: Medicare Other

## 2014-07-31 ENCOUNTER — Ambulatory Visit (HOSPITAL_BASED_OUTPATIENT_CLINIC_OR_DEPARTMENT_OTHER): Payer: Medicare Other | Admitting: Internal Medicine

## 2014-07-31 VITALS — BP 124/66 | HR 60 | Temp 97.0°F | Resp 18 | Ht 71.0 in | Wt 174.7 lb

## 2014-07-31 DIAGNOSIS — C9000 Multiple myeloma not having achieved remission: Secondary | ICD-10-CM

## 2014-07-31 DIAGNOSIS — C9002 Multiple myeloma in relapse: Secondary | ICD-10-CM

## 2014-07-31 DIAGNOSIS — D61818 Other pancytopenia: Secondary | ICD-10-CM

## 2014-07-31 DIAGNOSIS — I82890 Acute embolism and thrombosis of other specified veins: Secondary | ICD-10-CM

## 2014-07-31 DIAGNOSIS — T798XXD Other early complications of trauma, subsequent encounter: Secondary | ICD-10-CM

## 2014-07-31 DIAGNOSIS — Z5112 Encounter for antineoplastic immunotherapy: Secondary | ICD-10-CM | POA: Diagnosis present

## 2014-07-31 DIAGNOSIS — I82432 Acute embolism and thrombosis of left popliteal vein: Secondary | ICD-10-CM | POA: Diagnosis not present

## 2014-07-31 LAB — COMPREHENSIVE METABOLIC PANEL (CC13)
ALK PHOS: 36 U/L — AB (ref 40–150)
ALT: 16 U/L (ref 0–55)
ANION GAP: 6 meq/L (ref 3–11)
AST: 18 U/L (ref 5–34)
Albumin: 3.6 g/dL (ref 3.5–5.0)
BUN: 11.4 mg/dL (ref 7.0–26.0)
CALCIUM: 9 mg/dL (ref 8.4–10.4)
CO2: 23 meq/L (ref 22–29)
CREATININE: 0.9 mg/dL (ref 0.7–1.3)
Chloride: 110 mEq/L — ABNORMAL HIGH (ref 98–109)
GLUCOSE: 141 mg/dL — AB (ref 70–140)
Potassium: 3.7 mEq/L (ref 3.5–5.1)
Sodium: 139 mEq/L (ref 136–145)
Total Bilirubin: 2.27 mg/dL — ABNORMAL HIGH (ref 0.20–1.20)
Total Protein: 5.3 g/dL — ABNORMAL LOW (ref 6.4–8.3)

## 2014-07-31 LAB — CBC WITH DIFFERENTIAL/PLATELET
BASO%: 1.7 % (ref 0.0–2.0)
Basophils Absolute: 0.1 10*3/uL (ref 0.0–0.1)
EOS%: 2.4 % (ref 0.0–7.0)
Eosinophils Absolute: 0.1 10*3/uL (ref 0.0–0.5)
HCT: 27.3 % — ABNORMAL LOW (ref 38.4–49.9)
HGB: 8.4 g/dL — ABNORMAL LOW (ref 13.0–17.1)
LYMPH%: 21.4 % (ref 14.0–49.0)
MCH: 25.4 pg — AB (ref 27.2–33.4)
MCHC: 30.8 g/dL — ABNORMAL LOW (ref 32.0–36.0)
MCV: 82.7 fL (ref 79.3–98.0)
MONO#: 0.6 10*3/uL (ref 0.1–0.9)
MONO%: 19.5 % — ABNORMAL HIGH (ref 0.0–14.0)
NEUT#: 1.7 10*3/uL (ref 1.5–6.5)
NEUT%: 55 % (ref 39.0–75.0)
Platelets: 216 10*3/uL (ref 140–400)
RBC: 3.3 10*6/uL — AB (ref 4.20–5.82)
RDW: 18.2 % — ABNORMAL HIGH (ref 11.0–14.6)
WBC: 3.1 10*3/uL — ABNORMAL LOW (ref 4.0–10.3)
lymph#: 0.7 10*3/uL — ABNORMAL LOW (ref 0.9–3.3)

## 2014-07-31 MED ORDER — SODIUM CHLORIDE 0.9 % IV SOLN
Freq: Once | INTRAVENOUS | Status: AC
  Administered 2014-07-31: 15:00:00 via INTRAVENOUS
  Filled 2014-07-31: qty 4

## 2014-07-31 MED ORDER — SODIUM CHLORIDE 0.9 % IV SOLN
Freq: Once | INTRAVENOUS | Status: AC
  Administered 2014-07-31: 15:00:00 via INTRAVENOUS

## 2014-07-31 MED ORDER — DEXTROSE 5 % IV SOLN
36.0000 mg/m2 | Freq: Once | INTRAVENOUS | Status: AC
  Administered 2014-07-31: 72 mg via INTRAVENOUS
  Filled 2014-07-31: qty 36

## 2014-07-31 NOTE — Consult Note (Signed)
Reason for Consult: Port infection Referring Physician: Donley Harland is an 69 y.o. male.  HPI: Patient with multiple myeloma. Port catheter placed for chemotherapy 06/03/2013. Unfortunately the port pocket got infected, so the port was removed from the 06/20/2014. We started with iodoform gauze dressings, then changed to wet to dry. Today he comes for routine follow-up and site evaluation.  Past Medical History  Diagnosis Date  . Multiple myeloma   . Diabetes mellitus 07/03/2011  . Hypertension 07/03/2011  . Hyperlipidemia 07/03/2011  . Colon polyps 2012  . Gastric ulcer     Past Surgical History  Procedure Laterality Date  . Limbal stem cell transplant    . Humerus fracture surgery      right  . 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 Diabetes Mother   . Hyperlipidemia Mother   . Diabetes Sister   . Diabetes Brother   . Colon cancer Maternal Uncle     Social History:  reports that he has never smoked. He has never used smokeless tobacco. He reports that he does not drink alcohol or use illicit drugs.  Allergies: No Known Allergies  Medications: I have reviewed the patient's current medications.  Results for orders placed or performed in visit on 07/31/14 (from the past 48 hour(s))  CBC with Differential     Status: Abnormal   Collection Time: 07/31/14  8:55 AM  Result Value Ref Range   WBC 3.1 (L) 4.0 - 10.3 10e3/uL   NEUT# 1.7 1.5 - 6.5 10e3/uL   HGB 8.4 (L) 13.0 - 17.1 g/dL   HCT 27.3 (L) 38.4 - 49.9 %   Platelets 216 140 - 400 10e3/uL   MCV 82.7 79.3 - 98.0 fL   MCH 25.4 (L) 27.2 - 33.4 pg   MCHC 30.8 (L) 32.0 - 36.0 g/dL   RBC 3.30 (L) 4.20 - 5.82 10e6/uL   RDW 18.2 (H) 11.0 - 14.6 %   lymph# 0.7 (L) 0.9 - 3.3 10e3/uL   MONO# 0.6 0.1 - 0.9 10e3/uL   Eosinophils Absolute 0.1 0.0 - 0.5 10e3/uL   Basophils Absolute 0.1 0.0 - 0.1 10e3/uL   NEUT% 55.0 39.0 - 75.0 %   LYMPH% 21.4  14.0 - 49.0 %   MONO% 19.5 (H) 0.0 - 14.0 %   EOS% 2.4 0.0 - 7.0 %   BASO% 1.7 0.0 - 2.0 %  Comprehensive metabolic panel (Cmet) - [facility]     Status: Abnormal   Collection Time: 07/31/14  8:55 AM  Result Value Ref Range   Sodium 139 136 - 145 mEq/L   Potassium 3.7 3.5 - 5.1 mEq/L   Chloride 110 (H) 98 - 109 mEq/L   CO2 23 22 - 29 mEq/L   Glucose 141 (H) 70 - 140 mg/dl   BUN 11.4 7.0 - 26.0 mg/dL   Creatinine 0.9 0.7 - 1.3 mg/dL   Total Bilirubin 2.27 (H) 0.20 - 1.20 mg/dL   Alkaline Phosphatase 36 (L) 40 - 150 U/L   AST 18 5 - 34 U/L   ALT 16 0 - 55 U/L   Total Protein 5.3 (L) 6.4 - 8.3 g/dL   Albumin 3.6 3.5 - 5.0 g/dL   Calcium 9.0 8.4 - 10.4 mg/dL   Anion Gap 6 3 - 11 mEq/L   EGFR >90 >90 ml/min/1.73 m2    Comment: eGFR is calculated using the CKD-EPI Creatinine  Equation (2009)    No results found.  ROS There were no vitals taken for this visit. Physical Exam the port site is well-healed. There is a 2 mm Centrally. There is no material on the overlying gauze bandage. There is no tenderness redness swelling.  Assessment/Plan: The port site has healed. I told the patient to put a Band-Aid over the scar if it is irritating, otherwise can be left open to air. He is interested in having a new port placed on the contralateral side.  Byard Carranza III, DAYNE Demonte Dobratz 07/31/2014, 10:49 AM

## 2014-07-31 NOTE — Telephone Encounter (Signed)
Per staff message and POF I have scheduled appts. Advised scheduler of appts and to move labs. JMW  

## 2014-07-31 NOTE — Telephone Encounter (Signed)
Per staff message and POF I have scheduled appts. Advised scheduler of appts. JMW  

## 2014-07-31 NOTE — Progress Notes (Signed)
[place] [phone]   [phone]  OFFICE PROGRESS NOTE  [name] [address] 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  2) the venous thrombosis diagnosed in June 2016  PRIOR THERAPY: :  1. Status post 6 cycles of systemic chemotherapy with Revlimid and Decadron, last dose was given 07/21/2010 with very good response. 2. 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 [name].  3. maintenance Revlimid at 10 mg by mouth daily status post 2 months. Therapy began 01/18/2011. 4. maintenance Revlimid at 15 mg by mouth daily with prophylactic dose Coumadin at 2 mg by mouth daily. 5. Systemic chemotherapy with Velcade at 1.3 mg per meter squared given on days 1, 4, 8 and 11 and Doxil at 30 mg per meter square given on day 4 and Decadron 40 mg by mouth on weekly basis given every 3 weeks. Status post 4 cycles. 6. Zometa 4 mg IV every 4 weeks.  7. Velcade 1.3 mg/M2 subcutaneous daily on a weekly basis with Decadron 20 mg by mouth on a weekly basis. First cycle expected on 06/21/2012. S/P 4 cycles. 8. Systemic chemotherapy with Carfilzomib, cyclophosphamide and Decadron. First cycle started on 08/09/2012. He is status post 19 cycles.   CURRENT THERAPY:  1) Systemic chemotherapy with Carfilzomib 36 MG/M2 on days 1, 2, 8, 9, 15 and 16 every 4 weeks, Pomalyst 4 mg by mouth daily for 21 days every 4 weeks and Decadron 40 mg by mouth weekly. First cycle started on 04/10/2014. Status post 4 cycles. 2) Zometa every 8 weeks.  3) Xarelto 20 mg by mouth daily, started 07/17/2014.    INTERVAL HISTORY: Eddie Thomas 69 y.o. male returns to the clinic today for followup visit. He is currently on treatment with Carfilzomib, Pomalyst and dexamethasone status post 4 cycle and tolerating his treatment fairly well except for  mild pancytopenia. He denied having any bleeding issues. He denied having any significant weight loss or night sweats. He has no fever or chills. He has no nausea or vomiting. The patient denied having any significant chest pain, shortness of breath, cough or hemoptysis. He has significant improvement in his disease after the first 3 cycles of his treatment. He is here today to start cycle #5.  MEDICAL HISTORY: Past Medical History  Diagnosis Date  . Multiple myeloma   . Diabetes mellitus 07/03/2011  . Hypertension 07/03/2011  . Hyperlipidemia 07/03/2011  . Colon polyps 2012  . Gastric ulcer     ALLERGIES:  has No Known Allergies.  MEDICATIONS:  Current Outpatient Prescriptions  Medication Sig Dispense Refill  . Cholecalciferol (VITAMIN D-3) 5000 UNITS TABS Take 1 tablet by mouth daily.    [name] dexamethasone (DECADRON) 4 MG tablet TAKE FIVE TABLETS BY MOUTH EVERY WEEK START WITH THE FIRST DOSE OF CHEMOTHERAPY 80 tablet 0  . lisinopril (PRINIVIL,ZESTRIL) 10 MG tablet Take 5 mg by mouth every evening.     [name] oxyCODONE-acetaminophen (PERCOCET/ROXICET) 5-325 MG per tablet Take 1 tablet by mouth every 6 (six) hours as needed for severe pain. 30 tablet 0  . pantoprazole (PROTONIX) 40 MG tablet Take 1 tablet (40 mg total) by mouth 2 (two) times daily. 60 tablet 11  . pioglitazone (ACTOS) 15 MG tablet Take 15 mg by mouth daily.    . pomalidomide (POMALYST) 4 MG capsule Take 1 capsule (4  mg total) by mouth daily. Take with water on days 1-21. Repeat every 28 days.authorization number [id number] on 07/19/14 21 capsule 0  . prochlorperazine (COMPAZINE) 10 MG tablet Take 10 mg by mouth every 6 (six) hours as needed for nausea or vomiting.    . Rivaroxaban (XARELTO STARTER PACK) 15 & 20 MG TBPK Take as directed on package: Start with one 15mg tablet by mouth twice a day with food. On Day 22, switch to one 20mg tablet once a day with food. 51 each 0  . simvastatin (ZOCOR) 10 MG tablet Take 10 mg by mouth at  bedtime.      . temazepam (RESTORIL) 15 MG capsule Take 15 mg by mouth at bedtime as needed for sleep.    . valACYclovir (VALTREX) 500 MG tablet TAKE ONE CAPLET BY MOUTH ONCE DAILY 30 tablet 3  . VIAGRA 50 MG tablet Take 50 mg by mouth daily as needed for erectile dysfunction.      No current facility-administered medications for this visit.    SURGICAL HISTORY:  Past Surgical History  Procedure Laterality Date  . Limbal stem cell transplant    . Humerus fracture surgery      right  . 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    REVIEW OF SYSTEMS:  Constitutional: positive for fatigue Eyes: negative Ears, nose, mouth, throat, and face: negative Respiratory: negative Cardiovascular: negative Gastrointestinal: negative Genitourinary:negative Integument/breast: negative Hematologic/lymphatic: negative Musculoskeletal:negative Neurological: negative Behavioral/Psych: negative Endocrine: negative Allergic/Immunologic: negative   PHYSICAL EXAMINATION: General appearance: alert, cooperative and no distress Head: Normocephalic, without obvious abnormality, atraumatic Neck: no adenopathy, no JVD, supple, symmetrical, trachea midline and thyroid not enlarged, symmetric, no tenderness/mass/nodules Lymph nodes: Cervical, supraclavicular, and axillary nodes normal. Resp: clear to auscultation bilaterally Back: symmetric, no curvature. ROM normal. No CVA tenderness. Cardio: regular rate and rhythm, S1, S2 normal, no murmur, click, rub or gallop GI: soft, non-tender; bowel sounds normal; no masses,  no organomegaly Extremities: extremities normal, atraumatic, no cyanosis or edema Neurologic: Alert and oriented X 3, normal strength and tone. Normal symmetric reflexes. Normal coordination and gait  ECOG PERFORMANCE STATUS: 1 - Symptomatic but completely ambulatory  Blood pressure 124/66, pulse 60, temperature 97 F (36.1 C), temperature source Oral, resp. rate 18, height  5' 11" (1.803 m), weight 174 lb 11.2 oz (79.243 kg), SpO2 100 %.  LABORATORY DATA: Lab Results  Component Value Date   WBC 3.1* 07/31/2014   HGB 8.4* 07/31/2014   HCT 27.3* 07/31/2014   MCV 82.7 07/31/2014   PLT 216 07/31/2014      Chemistry      Component Value Date/Time   NA 138 07/17/2014 0812   NA 139 08/30/2013 0935   K 4.0 07/17/2014 0812   K 4.1 08/30/2013 0935   CL 101 08/30/2013 0935   CL 103 07/12/2012 0907   CO2 23 07/17/2014 0812   CO2 26 08/30/2013 0935   BUN 13.9 07/17/2014 0812   BUN 11 08/30/2013 0935   CREATININE 0.9 07/17/2014 0812   CREATININE 1.03 08/30/2013 0935      Component Value Date/Time   CALCIUM 8.6 07/17/2014 0812   CALCIUM 9.2 08/30/2013 0935   ALKPHOS 39* 07/17/2014 0812   ALKPHOS 39 08/30/2013 0935   AST 12 07/17/2014 0812   AST 13 08/30/2013 0935   ALT 15 07/17/2014 0812   ALT 9 08/30/2013 0935   BILITOT 2.15* 07/17/2014 0812   BILITOT 1.3* 08/30/2013 0935       Other lab results:   RADIOGRAPHIC STUDIES: No results found.  ASSESSMENT AND PLAN: This is a very pleasant 69 years old [place] American male with history of multiple myeloma currently undergoing systemic chemotherapy with Carfilzomib, cyclophosphamide and Decadron status post 19 cycles. This was discontinued secondary to disease progression. The patient has a started on systemic chemotherapy with Carfilzomib, Pomalyst and Decadron status post 4 cycles. He is tolerating his treatment fairly well except for mild pancytopenia We will proceed with cycle #5 today as a scheduled. The patient would come back for follow-up visit in 4 weeks before starting cycle #6 for evaluation. He'll continue on Zometa every 2 months. For the venous thrombosis, he will continue on Xarelto 20 mg by mouth daily. He was advised to call immediately if he has any concerning symptoms in the interval.  The patient voices understanding of current disease status and treatment options and is in agreement  with the current care plan. All questions were answered. The patient knows to call the clinic with any problems, questions or concerns. We can certainly see the patient much sooner if necessary.  Disclaimer: This note was dictated with voice recognition software. Similar sounding words can inadvertently be transcribed and may not be corrected upon review.

## 2014-07-31 NOTE — Telephone Encounter (Signed)
per pof to sch pt appt-sent MW email to sch trmt-pt to get copy of completed avs 7/12

## 2014-07-31 NOTE — Patient Instructions (Addendum)
PAC placement on Jul 22. Per Dr. Julien Nordmann, please hold Xerelto 24hrs prior to surgical procedure.  [facility] Discharge Instructions for Patients Receiving Chemotherapy  Today you received the following chemotherapy agent: Kyprolis.  To help prevent nausea and vomiting after your treatment, we encourage you to take your nausea medication.   If you develop nausea and vomiting that is not controlled by your nausea medication, call the clinic.   BELOW ARE SYMPTOMS THAT SHOULD BE REPORTED IMMEDIATELY:  *FEVER GREATER THAN 100.5 F  *CHILLS WITH OR WITHOUT FEVER  NAUSEA AND VOMITING THAT IS NOT CONTROLLED WITH YOUR NAUSEA MEDICATION  *UNUSUAL SHORTNESS OF BREATH  *UNUSUAL BRUISING OR BLEEDING  TENDERNESS IN MOUTH AND THROAT WITH OR WITHOUT PRESENCE OF ULCERS  *URINARY PROBLEMS  *BOWEL PROBLEMS  UNUSUAL RASH Items with * indicate a potential emergency and should be followed up as soon as possible.  Feel free to call the clinic you have any questions or concerns. The clinic phone number is [phone].  Please show the [facility] at check-in to the Emergency Department and triage nurse.

## 2014-08-01 ENCOUNTER — Ambulatory Visit (HOSPITAL_BASED_OUTPATIENT_CLINIC_OR_DEPARTMENT_OTHER): Payer: Medicare Other

## 2014-08-01 VITALS — BP 144/77 | HR 64 | Temp 97.7°F | Resp 20

## 2014-08-01 DIAGNOSIS — Z5112 Encounter for antineoplastic immunotherapy: Secondary | ICD-10-CM

## 2014-08-01 DIAGNOSIS — C9002 Multiple myeloma in relapse: Secondary | ICD-10-CM | POA: Diagnosis not present

## 2014-08-01 DIAGNOSIS — C9000 Multiple myeloma not having achieved remission: Secondary | ICD-10-CM

## 2014-08-01 MED ORDER — SODIUM CHLORIDE 0.9 % IV SOLN
Freq: Once | INTRAVENOUS | Status: AC
  Administered 2014-08-01: 14:00:00 via INTRAVENOUS

## 2014-08-01 MED ORDER — SODIUM CHLORIDE 0.9 % IV SOLN
Freq: Once | INTRAVENOUS | Status: AC
  Administered 2014-08-01: 14:00:00 via INTRAVENOUS
  Filled 2014-08-01: qty 4

## 2014-08-01 MED ORDER — SODIUM CHLORIDE 0.9 % IV SOLN: Freq: Once | INTRAVENOUS | Status: DC

## 2014-08-01 MED ORDER — DEXTROSE 5 % IV SOLN
36.0000 mg/m2 | Freq: Once | INTRAVENOUS | Status: AC
  Administered 2014-08-01: 72 mg via INTRAVENOUS
  Filled 2014-08-01: qty 36

## 2014-08-04 ENCOUNTER — Other Ambulatory Visit: Payer: Self-pay | Admitting: Medical Oncology

## 2014-08-04 DIAGNOSIS — C9000 Multiple myeloma not having achieved remission: Secondary | ICD-10-CM

## 2014-08-07 ENCOUNTER — Ambulatory Visit (HOSPITAL_BASED_OUTPATIENT_CLINIC_OR_DEPARTMENT_OTHER): Payer: Medicare Other

## 2014-08-07 ENCOUNTER — Other Ambulatory Visit (HOSPITAL_BASED_OUTPATIENT_CLINIC_OR_DEPARTMENT_OTHER): Payer: Medicare Other

## 2014-08-07 ENCOUNTER — Other Ambulatory Visit: Payer: Medicare Other

## 2014-08-07 VITALS — BP 131/67 | HR 63 | Temp 98.1°F | Resp 17

## 2014-08-07 DIAGNOSIS — C9000 Multiple myeloma not having achieved remission: Secondary | ICD-10-CM

## 2014-08-07 DIAGNOSIS — C9002 Multiple myeloma in relapse: Secondary | ICD-10-CM | POA: Diagnosis present

## 2014-08-07 DIAGNOSIS — Z5112 Encounter for antineoplastic immunotherapy: Secondary | ICD-10-CM | POA: Diagnosis present

## 2014-08-07 LAB — COMPREHENSIVE METABOLIC PANEL (CC13)
ALT: 13 U/L (ref 0–55)
AST: 12 U/L (ref 5–34)
Albumin: 3.8 g/dL (ref 3.5–5.0)
Alkaline Phosphatase: 43 U/L (ref 40–150)
Anion Gap: 7 mEq/L (ref 3–11)
BUN: 14.9 mg/dL (ref 7.0–26.0)
CALCIUM: 9.3 mg/dL (ref 8.4–10.4)
CHLORIDE: 107 meq/L (ref 98–109)
CO2: 22 meq/L (ref 22–29)
CREATININE: 0.9 mg/dL (ref 0.7–1.3)
EGFR: >90 mL/min/{1.73_m2} (ref >90)
Glucose: 114 mg/dl (ref 70–140)
Potassium: 3.8 mEq/L (ref 3.5–5.1)
Sodium: 136 mEq/L (ref 136–145)
TOTAL PROTEIN: 5.7 g/dL — AB (ref 6.4–8.3)
Total Bilirubin: 2.73 mg/dL — ABNORMAL HIGH (ref 0.20–1.20)

## 2014-08-07 LAB — TECHNOLOGIST REVIEW

## 2014-08-07 LAB — CBC WITH DIFFERENTIAL/PLATELET
BASO%: 1.1 % (ref 0.0–2.0)
BASOS ABS: 0 10*3/uL (ref 0.0–0.1)
EOS%: 4.7 % (ref 0.0–7.0)
Eosinophils Absolute: 0.1 10*3/uL (ref 0.0–0.5)
HCT: 28.9 % — ABNORMAL LOW (ref 38.4–49.9)
HEMOGLOBIN: 8.9 g/dL — AB (ref 13.0–17.1)
LYMPH#: 0.6 10*3/uL — AB (ref 0.9–3.3)
LYMPH%: 20.2 % (ref 14.0–49.0)
MCH: 25.2 pg — AB (ref 27.2–33.4)
MCHC: 30.7 g/dL — ABNORMAL LOW (ref 32.0–36.0)
MCV: 82.3 fL (ref 79.3–98.0)
MONO#: 0.2 10*3/uL (ref 0.1–0.9)
MONO%: 7.8 % (ref 0.0–14.0)
NEUT%: 66.2 % (ref 39.0–75.0)
NEUTROS ABS: 1.9 10*3/uL (ref 1.5–6.5)
Platelets: 137 10*3/uL — ABNORMAL LOW (ref 140–400)
RBC: 3.51 10*6/uL — AB (ref 4.20–5.82)
RDW: 17.8 % — ABNORMAL HIGH (ref 11.0–14.6)
WBC: 2.9 10*3/uL — AB (ref 4.0–10.3)

## 2014-08-07 MED ORDER — SODIUM CHLORIDE 0.9 % IV SOLN
Freq: Once | INTRAVENOUS | Status: AC
  Administered 2014-08-07: 09:00:00 via INTRAVENOUS

## 2014-08-07 MED ORDER — DEXTROSE 5 % IV SOLN
36.0000 mg/m2 | Freq: Once | INTRAVENOUS | Status: AC
  Administered 2014-08-07: 72 mg via INTRAVENOUS
  Filled 2014-08-07: qty 36

## 2014-08-07 MED ORDER — SODIUM CHLORIDE 0.9 % IV SOLN: Freq: Once | INTRAVENOUS | Status: DC

## 2014-08-07 MED ORDER — SODIUM CHLORIDE 0.9 % IV SOLN
Freq: Once | INTRAVENOUS | Status: AC
  Administered 2014-08-07: 09:00:00 via INTRAVENOUS
  Filled 2014-08-07: qty 4

## 2014-08-07 NOTE — Progress Notes (Signed)
Pt total bili 2.73.  Okay to treat per Dr. Julien Nordmann.

## 2014-08-07 NOTE — Patient Instructions (Signed)
[facility] Discharge Instructions for Patients Receiving Chemotherapy  Today you received the following chemotherapy agents: Kyprolis   To help prevent nausea and vomiting after your treatment, we encourage you to take your nausea medication as directed.    If you develop nausea and vomiting that is not controlled by your nausea medication, call the clinic.   BELOW ARE SYMPTOMS THAT SHOULD BE REPORTED IMMEDIATELY:  *FEVER GREATER THAN 100.5 F  *CHILLS WITH OR WITHOUT FEVER  NAUSEA AND VOMITING THAT IS NOT CONTROLLED WITH YOUR NAUSEA MEDICATION  *UNUSUAL SHORTNESS OF BREATH  *UNUSUAL BRUISING OR BLEEDING  TENDERNESS IN MOUTH AND THROAT WITH OR WITHOUT PRESENCE OF ULCERS  *URINARY PROBLEMS  *BOWEL PROBLEMS  UNUSUAL RASH Items with * indicate a potential emergency and should be followed up as soon as possible.  Feel free to call the clinic you have any questions or concerns. The clinic phone number is [phone].  Please show the CHEMO ALERT CARD at check-in to the Emergency Department and triage nurse.   

## 2014-08-08 ENCOUNTER — Ambulatory Visit (HOSPITAL_BASED_OUTPATIENT_CLINIC_OR_DEPARTMENT_OTHER): Payer: Medicare Other

## 2014-08-08 VITALS — BP 128/62 | HR 61 | Temp 97.9°F | Resp 18

## 2014-08-08 DIAGNOSIS — C9000 Multiple myeloma not having achieved remission: Secondary | ICD-10-CM

## 2014-08-08 DIAGNOSIS — Z5112 Encounter for antineoplastic immunotherapy: Secondary | ICD-10-CM | POA: Diagnosis present

## 2014-08-08 DIAGNOSIS — C9001 Multiple myeloma in remission: Secondary | ICD-10-CM

## 2014-08-08 MED ORDER — DEXTROSE 5 % IV SOLN
36.0000 mg/m2 | Freq: Once | INTRAVENOUS | Status: AC
  Administered 2014-08-08: 72 mg via INTRAVENOUS
  Filled 2014-08-08: qty 36

## 2014-08-08 MED ORDER — SODIUM CHLORIDE 0.9 % IV SOLN: Freq: Once | INTRAVENOUS | Status: DC

## 2014-08-08 MED ORDER — DEXAMETHASONE SODIUM PHOSPHATE 100 MG/10ML IJ SOLN
Freq: Once | INTRAMUSCULAR | Status: AC
  Administered 2014-08-08: 14:00:00 via INTRAVENOUS
  Filled 2014-08-08: qty 4

## 2014-08-08 MED ORDER — SODIUM CHLORIDE 0.9 % IV SOLN
Freq: Once | INTRAVENOUS | Status: AC
  Administered 2014-08-08: 14:00:00 via INTRAVENOUS

## 2014-08-08 NOTE — Patient Instructions (Signed)
[facility] Discharge Instructions for Patients Receiving Chemotherapy  Today you received the following chemotherapy agents: Kyprolis   To help prevent nausea and vomiting after your treatment, we encourage you to take your nausea medication as directed.    If you develop nausea and vomiting that is not controlled by your nausea medication, call the clinic.   BELOW ARE SYMPTOMS THAT SHOULD BE REPORTED IMMEDIATELY:  *FEVER GREATER THAN 100.5 F  *CHILLS WITH OR WITHOUT FEVER  NAUSEA AND VOMITING THAT IS NOT CONTROLLED WITH YOUR NAUSEA MEDICATION  *UNUSUAL SHORTNESS OF BREATH  *UNUSUAL BRUISING OR BLEEDING  TENDERNESS IN MOUTH AND THROAT WITH OR WITHOUT PRESENCE OF ULCERS  *URINARY PROBLEMS  *BOWEL PROBLEMS  UNUSUAL RASH Items with * indicate a potential emergency and should be followed up as soon as possible.  Feel free to call the clinic you have any questions or concerns. The clinic phone number is [phone].  Please show the CHEMO ALERT CARD at check-in to the Emergency Department and triage nurse.   

## 2014-08-09 ENCOUNTER — Other Ambulatory Visit: Payer: Self-pay | Admitting: Radiology

## 2014-08-09 ENCOUNTER — Other Ambulatory Visit: Payer: Self-pay | Admitting: Nurse Practitioner

## 2014-08-10 ENCOUNTER — Other Ambulatory Visit: Payer: Self-pay | Admitting: Radiology

## 2014-08-10 ENCOUNTER — Other Ambulatory Visit: Payer: Self-pay | Admitting: Medical Oncology

## 2014-08-10 DIAGNOSIS — C9000 Multiple myeloma not having achieved remission: Secondary | ICD-10-CM

## 2014-08-10 MED ORDER — POMALIDOMIDE 4 MG PO CAPS: 4.0000 mg | ORAL_CAPSULE | Freq: Every day | ORAL | Status: DC

## 2014-08-10 NOTE — Progress Notes (Signed)
Faxed revlimid

## 2014-08-11 ENCOUNTER — Other Ambulatory Visit: Payer: Self-pay | Admitting: Medical Oncology

## 2014-08-11 ENCOUNTER — Other Ambulatory Visit: Payer: Self-pay | Admitting: Internal Medicine

## 2014-08-11 ENCOUNTER — Encounter (HOSPITAL_COMMUNITY): Payer: Self-pay

## 2014-08-11 ENCOUNTER — Ambulatory Visit (HOSPITAL_COMMUNITY)
Admission: RE | Admit: 2014-08-11 | Discharge: 2014-08-11 | Disposition: A | Discharge status: Home / Self Care | Payer: Medicare Other | Source: Ambulatory Visit | Attending: Internal Medicine | Admitting: Internal Medicine

## 2014-08-11 ENCOUNTER — Telehealth: Payer: Self-pay | Admitting: Internal Medicine

## 2014-08-11 DIAGNOSIS — C9000 Multiple myeloma not having achieved remission: Secondary | ICD-10-CM | POA: Insufficient documentation

## 2014-08-11 LAB — CBC WITH DIFFERENTIAL/PLATELET
BASOS PCT: 0 % (ref 0–1)
Basophils Absolute: 0 10*3/uL (ref 0.0–0.1)
Eosinophils Absolute: 0.1 10*3/uL (ref 0.0–0.7)
Eosinophils Relative: 2 % (ref 0–5)
HCT: 26.8 % — ABNORMAL LOW (ref 39.0–52.0)
Hemoglobin: 8.2 g/dL — ABNORMAL LOW (ref 13.0–17.0)
LYMPHS ABS: 0.6 10*3/uL — AB (ref 0.7–4.0)
Lymphocytes Relative: 15 % (ref 12–46)
MCH: 24.8 pg — ABNORMAL LOW (ref 26.0–34.0)
MCHC: 30.6 g/dL (ref 30.0–36.0)
MCV: 81 fL (ref 78.0–100.0)
MONO ABS: 0.6 10*3/uL (ref 0.1–1.0)
MONOS PCT: 14 % — AB (ref 3–12)
NEUTROS ABS: 2.7 10*3/uL (ref 1.7–7.7)
Neutrophils Relative %: 69 % (ref 43–77)
PLATELETS: 118 10*3/uL — AB (ref 150–400)
RBC: 3.31 MIL/uL — ABNORMAL LOW (ref 4.22–5.81)
RDW: 16.7 % — ABNORMAL HIGH (ref 11.5–15.5)
WBC: 4 10*3/uL (ref 4.0–10.5)

## 2014-08-11 LAB — PROTIME-INR
INR: 1.02 (ref 0.00–1.49)
Prothrombin Time: 13.6 seconds (ref 11.6–15.2)

## 2014-08-11 MED ORDER — MIDAZOLAM HCL 2 MG/2ML IJ SOLN
INTRAMUSCULAR | Status: AC
  Filled 2014-08-11: qty 6

## 2014-08-11 MED ORDER — FENTANYL CITRATE (PF) 100 MCG/2ML IJ SOLN
INTRAMUSCULAR | Status: AC
  Filled 2014-08-11: qty 4

## 2014-08-11 MED ORDER — HEPARIN SOD (PORK) LOCK FLUSH 100 UNIT/ML IV SOLN
INTRAVENOUS | Status: AC
  Filled 2014-08-11: qty 5

## 2014-08-11 MED ORDER — MIDAZOLAM HCL 2 MG/2ML IJ SOLN
INTRAMUSCULAR | Status: AC | PRN
Start: 2014-08-11 — End: 2014-08-11
  Administered 2014-08-11 (×3): 1 mg via INTRAVENOUS

## 2014-08-11 MED ORDER — CEFAZOLIN SODIUM-DEXTROSE 2-3 GM-% IV SOLR
2.0000 g | Freq: Once | INTRAVENOUS | Status: AC
Start: 2014-08-11 — End: 2014-08-11
  Administered 2014-08-11: 2 g via INTRAVENOUS

## 2014-08-11 MED ORDER — CEFAZOLIN SODIUM-DEXTROSE 2-3 GM-% IV SOLR
INTRAVENOUS | Status: AC
  Filled 2014-08-11: qty 50

## 2014-08-11 MED ORDER — LIDOCAINE-EPINEPHRINE 2 %-1:100000 IJ SOLN
INTRAMUSCULAR | Status: AC
  Filled 2014-08-11: qty 1

## 2014-08-11 MED ORDER — FENTANYL CITRATE (PF) 100 MCG/2ML IJ SOLN
INTRAMUSCULAR | Status: AC | PRN
  Administered 2014-08-11: 50 ug via INTRAVENOUS

## 2014-08-11 MED ORDER — SODIUM CHLORIDE 0.9 % IV SOLN
INTRAVENOUS | Status: DC
  Administered 2014-08-11: 13:00:00 via INTRAVENOUS

## 2014-08-11 MED ORDER — LIDOCAINE HCL 1 % IJ SOLN
INTRAMUSCULAR | Status: AC
  Filled 2014-08-11: qty 20

## 2014-08-11 NOTE — Progress Notes (Signed)
Too early for xarelto 20 mg . Pt still should be on started pack. Will confer with Mohamed.

## 2014-08-11 NOTE — Discharge Instructions (Signed)
Implanted Port Insertion, Care After °Refer to this sheet in the next few weeks. These instructions provide you with information on caring for yourself after your procedure. Your health care provider may also give you more specific instructions. Your treatment has been planned according to current medical practices, but problems sometimes occur. Call your health care provider if you have any problems or questions after your procedure. °WHAT TO EXPECT AFTER THE PROCEDURE °After your procedure, it is typical to have the following:  °· Discomfort at the port insertion site. Ice packs to the area will help. °· Bruising on the skin over the port. This will subside in 3-4 days. °HOME CARE INSTRUCTIONS °· After your port is placed, you will get a manufacturer's information card. The card has information about your port. Keep this card with you at all times.   °· Know what kind of port you have. There are many types of ports available.   °· Wear a medical alert bracelet in case of an emergency. This can help alert health care workers that you have a port.   °· The port can stay in for as long as your health care provider believes it is necessary.   °· A home health care nurse may give medicines and take care of the port.   °· You or a family member can get special training and directions for giving medicine and taking care of the port at home.   °SEEK MEDICAL CARE IF:  °· Your port does not flush or you are unable to get a blood return.   °· You have a fever or chills. °SEEK IMMEDIATE MEDICAL CARE IF: °· You have new fluid or pus coming from your incision.   °· You notice a bad smell coming from your incision site.   °· You have swelling, pain, or more redness at the incision or port site.   °· You have chest pain or shortness of breath. °Document Released: 10/27/2012 Document Revised: 01/11/2013 Document Reviewed: 10/27/2012 °ExitCare® Patient Information ©2015 [employer]. This information is not intended to replace  advice given to you by your health care provider. Make sure you discuss any questions you have with your health care provider. °Conscious Sedation, Adult, Care After °Refer to this sheet in the next few weeks. These instructions provide you with information on caring for yourself after your procedure. Your health care provider may also give you more specific instructions. Your treatment has been planned according to current medical practices, but problems sometimes occur. Call your health care provider if you have any problems or questions after your procedure. °WHAT TO EXPECT AFTER THE PROCEDURE  °After your procedure: °· You may feel sleepy, clumsy, and have poor balance for several hours. °· Vomiting may occur if you eat too soon after the procedure. °HOME CARE INSTRUCTIONS °· Do not participate in any activities where you could become injured for at least 24 hours. Do not: °¨ Drive. °¨ Swim. °¨ Ride a bicycle. °¨ Operate heavy machinery. °¨ Cook. °¨ Use power tools. °¨ Climb ladders. °¨ Work from a high place. °· Do not make important decisions or sign legal documents until you are improved. °· If you vomit, drink water, juice, or soup when you can drink without vomiting. Make sure you have little or no nausea before eating solid foods. °· Only take over-the-counter or prescription medicines for pain, discomfort, or fever as directed by your health care provider. °· Make sure you and your family fully understand everything about the medicines given to you, including what side effects   may occur. °· You should not drink alcohol, take sleeping pills, or take medicines that cause drowsiness for at least 24 hours. °· If you smoke, do not smoke without supervision. °· If you are feeling better, you may resume normal activities 24 hours after you were sedated. °· Keep all appointments with your health care provider. °SEEK MEDICAL CARE IF: °· Your skin is pale or bluish in color. °· You continue to feel nauseous or  vomit. °· Your pain is getting worse and is not helped by medicine. °· You have bleeding or swelling. °· You are still sleepy or feeling clumsy after 24 hours. °SEEK IMMEDIATE MEDICAL CARE IF: °· You develop a rash. °· You have difficulty breathing. °· You develop any type of allergic problem. °· You have a fever. °MAKE SURE YOU: °· Understand these instructions. °· Will watch your condition. °· Will get help right away if you are not doing well or get worse. °Document Released: 10/27/2012 Document Reviewed: 10/27/2012 °ExitCare® Patient Information ©2015 [employer]. This information is not intended to replace advice given to you by your health care provider. Make sure you discuss any questions you have with your health care provider. ° ° °

## 2014-08-11 NOTE — Procedures (Signed)
Successful RT IJ POWER PORT TIP SVC/RA NO COMP STABLE READY FOR USE FULL REPORT IN PACS  

## 2014-08-11 NOTE — H&P (Signed)
HPI: Patient with multiple myeloma who is being followed by Dr. Julien Nordmann and last seen 07/31/14. He is scheduled today for port a catheter placement.   The patient has had a H&P performed within the last 30 days, all history, medications, and exam have been reviewed. The patient denies any interval changes since the H&P.  Medications: Prior to Admission medications   Medication Sig Start Date End Date Taking? Authorizing Provider  Cholecalciferol (VITAMIN D-3) 5000 UNITS TABS Take 1 tablet by mouth daily.   Yes Historical Provider, MD  dexamethasone (DECADRON) 4 MG tablet TAKE FIVE TABLETS BY MOUTH EVERY WEEK START WITH THE FIRST DOSE OF CHEMOTHERAPY 05/15/14  Yes Adrena E Johnson, PA-C  lisinopril (PRINIVIL,ZESTRIL) 10 MG tablet Take 5 mg by mouth every evening.    Yes Historical Provider, MD  pantoprazole (PROTONIX) 40 MG tablet Take 1 tablet (40 mg total) by mouth 2 (two) times daily. 09/22/13  Yes Ladene Artist, MD  pioglitazone (ACTOS) 15 MG tablet Take 15 mg by mouth daily. 08/19/12  Yes Historical Provider, MD  pomalidomide (POMALYST) 4 MG capsule Take 1 capsule (4 mg total) by mouth daily. Take with water on days 1-21. Repeat every 28 days.authorization number [id number] on 07/19/14 08/10/14  Yes Curt Bears, MD  prochlorperazine (COMPAZINE) 10 MG tablet Take 10 mg by mouth every 6 (six) hours as needed for nausea or vomiting.   Yes Historical Provider, MD  rivaroxaban (XARELTO) 20 MG TABS tablet Take 20 mg by mouth daily with supper.   Yes Historical Provider, MD  simvastatin (ZOCOR) 10 MG tablet Take 10 mg by mouth at bedtime.     Yes Historical Provider, MD  valACYclovir (VALTREX) 500 MG tablet TAKE ONE CAPLET BY MOUTH ONCE DAILY 07/13/14  Yes Carlton Adam, PA-C  oxyCODONE-acetaminophen (PERCOCET/ROXICET) 5-325 MG per tablet Take 1 tablet by mouth every 6 (six) hours as needed for severe pain. 07/21/13   Curt Bears, MD  Rivaroxaban (XARELTO STARTER PACK) 15 & 20 MG TBPK Take as  directed on package: Start with one 70m tablet by mouth twice a day with food. On Day 22, switch to one 21mtablet once a day with food. Patient not taking: Reported on 08/09/2014 07/17/14   CySusanne BordersNP  temazepam (RESTORIL) 15 MG capsule Take 15 mg by mouth at bedtime as needed for sleep.    Historical Provider, MD  VIAGRA 50 MG tablet Take 50 mg by mouth daily as needed for erectile dysfunction.  02/23/13   Historical Provider, MD    Vital Signs: BP 158/85 mmHg  Pulse 59  Temp(Src) 98 F (36.7 C) (Oral)  Resp 16  Ht 6' 1"  (1.854 m)  Wt 175 lb (79.379 kg)  BMI 23.09 kg/m2  SpO2 100%  Physical Exam  Constitutional: He is oriented to person, place, and time. No distress.  HENT:  Head: Normocephalic and atraumatic.  Neck: No tracheal deviation present.  Cardiovascular: Normal rate and regular rhythm.  Exam reveals no gallop and no friction rub.   No murmur heard. Pulmonary/Chest: Effort normal and breath sounds normal. No respiratory distress. He has no wheezes. He has no rales.  Abdominal: Soft. Bowel sounds are normal. He exhibits no distension. There is no tenderness.  Neurological: He is alert and oriented to person, place, and time.  Skin: Skin is warm and dry. He is not diaphoretic.  Right sided chest port incision well healed without signs of drainage, redness or infection.    Mallampati Score:  MD  Evaluation Airway: WNL Heart: WNL Abdomen: WNL Chest/ Lungs: WNL ASA  Classification: 2 Mallampati/Airway Score: One  Labs:  CBC:  Recent Labs  07/17/14 0812 07/31/14 0855 08/07/14 0804 08/11/14 1315  WBC 4.9 3.1* 2.9* 4.0  HGB 9.1* 8.4* 8.9* 8.2*  HCT 28.9* 27.3* 28.9* 26.8*  PLT 160 216 137* PENDING    COAGS:  Recent Labs  08/11/14 1315  INR 1.02    BMP:  Recent Labs  08/30/13 0935  07/10/14 0943 07/17/14 0812 07/31/14 0855 08/07/14 0804  NA 139  < > 139 138 139 136  K 4.1  < > 4.2 4.0 3.7 3.8  CL 101  --   --   --   --   --   CO2 26   < > 24 23 23 22   GLUCOSE 154*  < > 124 123 141* 114  BUN 11  < > 15.6 13.9 11.4 14.9  CALCIUM 9.2  < > 9.0 8.6 9.0 9.3  CREATININE 1.03  < > 1.0 0.9 0.9 0.9  < > = values in this interval not displayed.  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 Risks and Benefits discussed with the patient including, but not limited to bleeding, infection, pneumothorax, or fibrin sheath development and need for additional procedures. All of the patient's questions were answered, patient is agreeable to proceed. Consent signed and in chart.   SignedHedy Jacob 08/11/2014, 2:08 PM

## 2014-08-11 NOTE — Telephone Encounter (Signed)
returned call and s.w. pt and advised on7.26 appt d.t

## 2014-08-11 NOTE — Progress Notes (Signed)
Pt and Wife, Kimberlee Nearing, verbalize understanding of verbal instructions, saying that Kimberlee Nearing does not read english. Gave Pt copy of discharge instructions which he reviewed and verbalized instructions of. Clarified restart date of Xarelto wit Dr Annamaria Boots.

## 2014-08-14 ENCOUNTER — Ambulatory Visit (HOSPITAL_BASED_OUTPATIENT_CLINIC_OR_DEPARTMENT_OTHER): Payer: Medicare Other

## 2014-08-14 ENCOUNTER — Other Ambulatory Visit: Payer: Medicare Other

## 2014-08-14 ENCOUNTER — Ambulatory Visit: Payer: Medicare Other

## 2014-08-14 ENCOUNTER — Other Ambulatory Visit (HOSPITAL_BASED_OUTPATIENT_CLINIC_OR_DEPARTMENT_OTHER): Payer: Medicare Other

## 2014-08-14 ENCOUNTER — Other Ambulatory Visit: Payer: Self-pay | Admitting: Medical Oncology

## 2014-08-14 VITALS — BP 178/78 | HR 55 | Temp 98.2°F | Resp 20

## 2014-08-14 DIAGNOSIS — Z5112 Encounter for antineoplastic immunotherapy: Secondary | ICD-10-CM | POA: Diagnosis present

## 2014-08-14 DIAGNOSIS — C9000 Multiple myeloma not having achieved remission: Secondary | ICD-10-CM

## 2014-08-14 DIAGNOSIS — C9002 Multiple myeloma in relapse: Secondary | ICD-10-CM

## 2014-08-14 DIAGNOSIS — Z95828 Presence of other vascular implants and grafts: Secondary | ICD-10-CM

## 2014-08-14 LAB — COMPREHENSIVE METABOLIC PANEL (CC13)
ALBUMIN: 3.7 g/dL (ref 3.5–5.0)
ALT: 13 U/L (ref 0–55)
AST: 12 U/L (ref 5–34)
Alkaline Phosphatase: 41 U/L (ref 40–150)
Anion Gap: 10 mEq/L (ref 3–11)
BILIRUBIN TOTAL: 2.31 mg/dL — AB (ref 0.20–1.20)
BUN: 12 mg/dL (ref 7.0–26.0)
CALCIUM: 9 mg/dL (ref 8.4–10.4)
CO2: 22 mEq/L (ref 22–29)
Chloride: 110 mEq/L — ABNORMAL HIGH (ref 98–109)
Creatinine: 0.9 mg/dL (ref 0.7–1.3)
EGFR: >90 mL/min/{1.73_m2} (ref >90)
Glucose: 110 mg/dl (ref 70–140)
Potassium: 3.8 mEq/L (ref 3.5–5.1)
Sodium: 141 mEq/L (ref 136–145)
TOTAL PROTEIN: 5.6 g/dL — AB (ref 6.4–8.3)

## 2014-08-14 LAB — CBC WITH DIFFERENTIAL/PLATELET
BASO%: 1.2 % (ref 0.0–2.0)
BASOS ABS: 0.1 10*3/uL (ref 0.0–0.1)
EOS%: 5.2 % (ref 0.0–7.0)
Eosinophils Absolute: 0.2 10*3/uL (ref 0.0–0.5)
HCT: 27.2 % — ABNORMAL LOW (ref 38.4–49.9)
HGB: 8.5 g/dL — ABNORMAL LOW (ref 13.0–17.1)
LYMPH#: 0.7 10*3/uL — AB (ref 0.9–3.3)
LYMPH%: 16.3 % (ref 14.0–49.0)
MCH: 25.5 pg — ABNORMAL LOW (ref 27.2–33.4)
MCHC: 31.2 g/dL — ABNORMAL LOW (ref 32.0–36.0)
MCV: 81.8 fL (ref 79.3–98.0)
MONO#: 0.6 10*3/uL (ref 0.1–0.9)
MONO%: 13.3 % (ref 0.0–14.0)
NEUT#: 2.8 10*3/uL (ref 1.5–6.5)
NEUT%: 64 % (ref 39.0–75.0)
Platelets: 155 10*3/uL (ref 140–400)
RBC: 3.33 10*6/uL — AB (ref 4.20–5.82)
RDW: 17 % — ABNORMAL HIGH (ref 11.0–14.6)
WBC: 4.4 10*3/uL (ref 4.0–10.3)

## 2014-08-14 MED ORDER — SODIUM CHLORIDE 0.9 % IV SOLN
Freq: Once | INTRAVENOUS | Status: AC
  Administered 2014-08-14: 12:00:00 via INTRAVENOUS
  Filled 2014-08-14: qty 4

## 2014-08-14 MED ORDER — DEXTROSE 5 % IV SOLN
36.0000 mg/m2 | Freq: Once | INTRAVENOUS | Status: AC
  Administered 2014-08-14: 72 mg via INTRAVENOUS
  Filled 2014-08-14: qty 36

## 2014-08-14 MED ORDER — SODIUM CHLORIDE 0.9 % IJ SOLN
10.0000 mL | INTRAMUSCULAR | Status: DC | PRN
  Administered 2014-08-14: 10 mL
  Filled 2014-08-14: qty 10

## 2014-08-14 MED ORDER — HEPARIN SOD (PORK) LOCK FLUSH 100 UNIT/ML IV SOLN
500.0000 [IU] | Freq: Once | INTRAVENOUS | Status: AC | PRN
  Administered 2014-08-14: 500 [IU]
  Filled 2014-08-14: qty 5

## 2014-08-14 MED ORDER — SODIUM CHLORIDE 0.9 % IJ SOLN
10.0000 mL | INTRAMUSCULAR | Status: DC | PRN
  Administered 2014-08-14: 10 mL via INTRAVENOUS
  Filled 2014-08-14: qty 10

## 2014-08-14 MED ORDER — SODIUM CHLORIDE 0.9 % IV SOLN
Freq: Once | INTRAVENOUS | Status: AC
  Administered 2014-08-14: 11:00:00 via INTRAVENOUS

## 2014-08-14 MED ORDER — SODIUM CHLORIDE 0.9 % IV SOLN: Freq: Once | INTRAVENOUS | Status: DC

## 2014-08-14 MED ORDER — RIVAROXABAN 20 MG PO TABS: 20.0000 mg | ORAL_TABLET | Freq: Every day | ORAL | Status: DC

## 2014-08-14 NOTE — Patient Instructions (Signed)

## 2014-08-14 NOTE — Progress Notes (Signed)
OK to treat with bili 2.31 per Dr. Julien Nordmann.

## 2014-08-14 NOTE — Progress Notes (Signed)
Pt accessed in Flush room. Not enough blood return to have labs drawn from port. Pt directed back to lab to have labs drawn. Port remained accessed for infusion.

## 2014-08-14 NOTE — Patient Instructions (Signed)
[facility] Discharge Instructions for Patients Receiving Chemotherapy  Today you received the following chemotherapy agents kyprolis  To help prevent nausea and vomiting after your treatment, we encourage you to take your nausea medication.   If you develop nausea and vomiting that is not controlled by your nausea medication, call the clinic.   BELOW ARE SYMPTOMS THAT SHOULD BE REPORTED IMMEDIATELY:  *FEVER GREATER THAN 100.5 F  *CHILLS WITH OR WITHOUT FEVER  NAUSEA AND VOMITING THAT IS NOT CONTROLLED WITH YOUR NAUSEA MEDICATION  *UNUSUAL SHORTNESS OF BREATH  *UNUSUAL BRUISING OR BLEEDING  TENDERNESS IN MOUTH AND THROAT WITH OR WITHOUT PRESENCE OF ULCERS  *URINARY PROBLEMS  *BOWEL PROBLEMS  UNUSUAL RASH Items with * indicate a potential emergency and should be followed up as soon as possible.  Feel free to call the clinic you have any questions or concerns. The clinic phone number is [phone].  Please show the [facility] at check-in to the Emergency Department and triage nurse.

## 2014-08-15 ENCOUNTER — Ambulatory Visit (HOSPITAL_BASED_OUTPATIENT_CLINIC_OR_DEPARTMENT_OTHER): Payer: Medicare Other

## 2014-08-15 VITALS — BP 142/64 | HR 68 | Temp 98.0°F | Resp 17

## 2014-08-15 DIAGNOSIS — Z5112 Encounter for antineoplastic immunotherapy: Secondary | ICD-10-CM

## 2014-08-15 DIAGNOSIS — C9002 Multiple myeloma in relapse: Secondary | ICD-10-CM | POA: Diagnosis not present

## 2014-08-15 DIAGNOSIS — C9000 Multiple myeloma not having achieved remission: Secondary | ICD-10-CM

## 2014-08-15 MED ORDER — SODIUM CHLORIDE 0.9 % IV SOLN
Freq: Once | INTRAVENOUS | Status: AC
  Administered 2014-08-15: 11:00:00 via INTRAVENOUS

## 2014-08-15 MED ORDER — SODIUM CHLORIDE 0.9 % IJ SOLN
10.0000 mL | INTRAMUSCULAR | Status: DC | PRN
  Administered 2014-08-15: 10 mL
  Filled 2014-08-15: qty 10

## 2014-08-15 MED ORDER — HEPARIN SOD (PORK) LOCK FLUSH 100 UNIT/ML IV SOLN
500.0000 [IU] | Freq: Once | INTRAVENOUS | Status: AC | PRN
  Administered 2014-08-15: 500 [IU]
  Filled 2014-08-15: qty 5

## 2014-08-15 MED ORDER — DEXTROSE 5 % IV SOLN
36.0000 mg/m2 | Freq: Once | INTRAVENOUS | Status: AC
  Administered 2014-08-15: 72 mg via INTRAVENOUS
  Filled 2014-08-15: qty 36

## 2014-08-15 MED ORDER — SODIUM CHLORIDE 0.9 % IV SOLN
Freq: Once | INTRAVENOUS | Status: AC
  Administered 2014-08-15: 11:00:00 via INTRAVENOUS
  Filled 2014-08-15: qty 4

## 2014-08-15 NOTE — Patient Instructions (Signed)
[facility] Discharge Instructions for Patients Receiving Chemotherapy  Today you received the following chemotherapy agents: Kyprolis   To help prevent nausea and vomiting after your treatment, we encourage you to take your nausea medication as directed.    If you develop nausea and vomiting that is not controlled by your nausea medication, call the clinic.   BELOW ARE SYMPTOMS THAT SHOULD BE REPORTED IMMEDIATELY:  *FEVER GREATER THAN 100.5 F  *CHILLS WITH OR WITHOUT FEVER  NAUSEA AND VOMITING THAT IS NOT CONTROLLED WITH YOUR NAUSEA MEDICATION  *UNUSUAL SHORTNESS OF BREATH  *UNUSUAL BRUISING OR BLEEDING  TENDERNESS IN MOUTH AND THROAT WITH OR WITHOUT PRESENCE OF ULCERS  *URINARY PROBLEMS  *BOWEL PROBLEMS  UNUSUAL RASH Items with * indicate a potential emergency and should be followed up as soon as possible.  Feel free to call the clinic you have any questions or concerns. The clinic phone number is [phone].  Please show the CHEMO ALERT CARD at check-in to the Emergency Department and triage nurse.   

## 2014-08-15 NOTE — Progress Notes (Signed)
Okay to treat with total bili 2.31 per infusion note from 7/25.  Pt also refuses additional hydration fluids.

## 2014-08-21 ENCOUNTER — Other Ambulatory Visit: Payer: Self-pay | Admitting: Medical Oncology

## 2014-08-21 ENCOUNTER — Telehealth: Payer: Self-pay | Admitting: Oncology

## 2014-08-21 ENCOUNTER — Other Ambulatory Visit (HOSPITAL_BASED_OUTPATIENT_CLINIC_OR_DEPARTMENT_OTHER): Payer: Medicare Other

## 2014-08-21 DIAGNOSIS — C9002 Multiple myeloma in relapse: Secondary | ICD-10-CM | POA: Diagnosis present

## 2014-08-21 DIAGNOSIS — C9000 Multiple myeloma not having achieved remission: Secondary | ICD-10-CM

## 2014-08-21 LAB — CBC WITH DIFFERENTIAL/PLATELET
BASO%: 0.5 % (ref 0.0–2.0)
Basophils Absolute: 0 10*3/uL (ref 0.0–0.1)
EOS ABS: 0.3 10*3/uL (ref 0.0–0.5)
EOS%: 11.1 % — ABNORMAL HIGH (ref 0.0–7.0)
HCT: 27.3 % — ABNORMAL LOW (ref 38.4–49.9)
HEMOGLOBIN: 8.5 g/dL — AB (ref 13.0–17.1)
LYMPH%: 26.6 % (ref 14.0–49.0)
MCH: 25.4 pg — AB (ref 27.2–33.4)
MCHC: 31.1 g/dL — ABNORMAL LOW (ref 32.0–36.0)
MCV: 81.7 fL (ref 79.3–98.0)
MONO#: 0.4 10*3/uL (ref 0.1–0.9)
MONO%: 17.5 % — ABNORMAL HIGH (ref 0.0–14.0)
NEUT#: 1 10*3/uL — ABNORMAL LOW (ref 1.5–6.5)
NEUT%: 44.3 % (ref 39.0–75.0)
Platelets: 154 10*3/uL (ref 140–400)
RBC: 3.35 10*6/uL — ABNORMAL LOW (ref 4.20–5.82)
RDW: 17.5 % — AB (ref 11.0–14.6)
WBC: 2.3 10*3/uL — ABNORMAL LOW (ref 4.0–10.3)
lymph#: 0.6 10*3/uL — ABNORMAL LOW (ref 0.9–3.3)

## 2014-08-21 LAB — COMPREHENSIVE METABOLIC PANEL (CC13)
ALBUMIN: 3.5 g/dL (ref 3.5–5.0)
ALT: 13 U/L (ref 0–55)
ANION GAP: 5 meq/L (ref 3–11)
AST: 10 U/L (ref 5–34)
Alkaline Phosphatase: 37 U/L — ABNORMAL LOW (ref 40–150)
BILIRUBIN TOTAL: 2.43 mg/dL — AB (ref 0.20–1.20)
BUN: 11.6 mg/dL (ref 7.0–26.0)
CO2: 24 mEq/L (ref 22–29)
Calcium: 8.8 mg/dL (ref 8.4–10.4)
Chloride: 109 mEq/L (ref 98–109)
Creatinine: 0.9 mg/dL (ref 0.7–1.3)
Glucose: 113 mg/dl (ref 70–140)
POTASSIUM: 4 meq/L (ref 3.5–5.1)
Sodium: 138 mEq/L (ref 136–145)
Total Protein: 5.2 g/dL — ABNORMAL LOW (ref 6.4–8.3)

## 2014-08-21 NOTE — Telephone Encounter (Signed)
Left message to adjust appointment on 08/08 to 9:45 schedule change.

## 2014-08-25 ENCOUNTER — Other Ambulatory Visit: Payer: Self-pay | Admitting: Medical Oncology

## 2014-08-25 DIAGNOSIS — C9000 Multiple myeloma not having achieved remission: Secondary | ICD-10-CM

## 2014-08-28 ENCOUNTER — Telehealth: Payer: Self-pay | Admitting: Internal Medicine

## 2014-08-28 ENCOUNTER — Ambulatory Visit: Payer: Medicare Other

## 2014-08-28 ENCOUNTER — Ambulatory Visit (HOSPITAL_BASED_OUTPATIENT_CLINIC_OR_DEPARTMENT_OTHER): Payer: Medicare Other | Admitting: Nurse Practitioner

## 2014-08-28 ENCOUNTER — Ambulatory Visit: Payer: Medicare Other | Admitting: Nurse Practitioner

## 2014-08-28 ENCOUNTER — Ambulatory Visit (HOSPITAL_BASED_OUTPATIENT_CLINIC_OR_DEPARTMENT_OTHER): Payer: Medicare Other

## 2014-08-28 ENCOUNTER — Encounter: Payer: Self-pay | Admitting: Nurse Practitioner

## 2014-08-28 ENCOUNTER — Other Ambulatory Visit: Payer: Medicare Other

## 2014-08-28 ENCOUNTER — Other Ambulatory Visit (HOSPITAL_BASED_OUTPATIENT_CLINIC_OR_DEPARTMENT_OTHER): Payer: Medicare Other

## 2014-08-28 VITALS — BP 143/64 | HR 66 | Temp 97.9°F | Resp 18 | Ht 73.0 in | Wt 173.3 lb

## 2014-08-28 DIAGNOSIS — C9002 Multiple myeloma in relapse: Secondary | ICD-10-CM | POA: Diagnosis present

## 2014-08-28 DIAGNOSIS — I82409 Acute embolism and thrombosis of unspecified deep veins of unspecified lower extremity: Secondary | ICD-10-CM | POA: Diagnosis not present

## 2014-08-28 DIAGNOSIS — C9000 Multiple myeloma not having achieved remission: Secondary | ICD-10-CM

## 2014-08-28 DIAGNOSIS — Z5112 Encounter for antineoplastic immunotherapy: Secondary | ICD-10-CM

## 2014-08-28 DIAGNOSIS — D61818 Other pancytopenia: Secondary | ICD-10-CM

## 2014-08-28 LAB — COMPREHENSIVE METABOLIC PANEL (CC13)
ALT: 14 U/L (ref 0–55)
AST: 13 U/L (ref 5–34)
Albumin: 4 g/dL (ref 3.5–5.0)
Alkaline Phosphatase: 39 U/L — ABNORMAL LOW (ref 40–150)
Anion Gap: 6 mEq/L (ref 3–11)
BUN: 10.5 mg/dL (ref 7.0–26.0)
CO2: 25 mEq/L (ref 22–29)
Calcium: 8.7 mg/dL (ref 8.4–10.4)
Chloride: 109 mEq/L (ref 98–109)
Creatinine: 1 mg/dL (ref 0.7–1.3)
EGFR: 85 mL/min/{1.73_m2} — ABNORMAL LOW (ref >90)
Glucose: 128 mg/dl (ref 70–140)
Potassium: 4 mEq/L (ref 3.5–5.1)
Sodium: 140 mEq/L (ref 136–145)
Total Bilirubin: 2.17 mg/dL — ABNORMAL HIGH (ref 0.20–1.20)
Total Protein: 5.8 g/dL — ABNORMAL LOW (ref 6.4–8.3)

## 2014-08-28 LAB — CBC WITH DIFFERENTIAL/PLATELET
BASO%: 1.5 % (ref 0.0–2.0)
Basophils Absolute: 0.1 10*3/uL (ref 0.0–0.1)
EOS%: 1.1 % (ref 0.0–7.0)
Eosinophils Absolute: 0 10*3/uL (ref 0.0–0.5)
HCT: 30.1 % — ABNORMAL LOW (ref 38.4–49.9)
HGB: 9.2 g/dL — ABNORMAL LOW (ref 13.0–17.1)
LYMPH%: 17.5 % (ref 14.0–49.0)
MCH: 25.3 pg — ABNORMAL LOW (ref 27.2–33.4)
MCHC: 30.5 g/dL — ABNORMAL LOW (ref 32.0–36.0)
MCV: 82.8 fL (ref 79.3–98.0)
MONO#: 0.6 10*3/uL (ref 0.1–0.9)
MONO%: 18.4 % — ABNORMAL HIGH (ref 0.0–14.0)
NEUT#: 2.1 10*3/uL (ref 1.5–6.5)
NEUT%: 61.5 % (ref 39.0–75.0)
Platelets: 220 10*3/uL (ref 140–400)
RBC: 3.64 10*6/uL — ABNORMAL LOW (ref 4.20–5.82)
RDW: 17.3 % — ABNORMAL HIGH (ref 11.0–14.6)
WBC: 3.4 10*3/uL — ABNORMAL LOW (ref 4.0–10.3)
lymph#: 0.6 10*3/uL — ABNORMAL LOW (ref 0.9–3.3)

## 2014-08-28 MED ORDER — CARFILZOMIB CHEMO INJECTION 60 MG
36.0000 mg/m2 | Freq: Once | INTRAVENOUS | Status: AC
  Administered 2014-08-28: 72 mg via INTRAVENOUS
  Filled 2014-08-28: qty 36

## 2014-08-28 MED ORDER — SODIUM CHLORIDE 0.9 % IV SOLN
Freq: Once | INTRAVENOUS | Status: AC
  Administered 2014-08-28: 12:00:00 via INTRAVENOUS

## 2014-08-28 MED ORDER — HEPARIN SOD (PORK) LOCK FLUSH 100 UNIT/ML IV SOLN
500.0000 [IU] | Freq: Once | INTRAVENOUS | Status: AC | PRN
  Administered 2014-08-28: 500 [IU]
  Filled 2014-08-28: qty 5

## 2014-08-28 MED ORDER — SODIUM CHLORIDE 0.9 % IJ SOLN
10.0000 mL | INTRAMUSCULAR | Status: DC | PRN
  Administered 2014-08-28: 10 mL
  Filled 2014-08-28: qty 10

## 2014-08-28 MED ORDER — SODIUM CHLORIDE 0.9 % IV SOLN
Freq: Once | INTRAVENOUS | Status: AC
  Administered 2014-08-28: 12:00:00 via INTRAVENOUS
  Filled 2014-08-28: qty 4

## 2014-08-28 MED ORDER — ZOLEDRONIC ACID 4 MG/100ML IV SOLN
4.0000 mg | Freq: Once | INTRAVENOUS | Status: AC
  Administered 2014-08-28: 4 mg via INTRAVENOUS
  Filled 2014-08-28: qty 100

## 2014-08-28 NOTE — Progress Notes (Signed)
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  2) the venous thrombosis diagnosed in June 2016  PRIOR THERAPY: :  1. Status post 6 cycles of systemic chemotherapy with Revlimid and Decadron, last dose was given 07/21/2010 with very good response. 2. 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 under the care of Dr. [name].  3. maintenance Revlimid at 10 mg by mouth daily status post 2 months. Therapy began 01/18/2011. 4. maintenance Revlimid at 15 mg by mouth daily with prophylactic dose Coumadin at 2 mg by mouth daily. 5. Systemic chemotherapy with Velcade at 1.3 mg per meter squared given on days 1, 4, 8 and 11 and Doxil at 30 mg per meter square given on day 4 and Decadron 40 mg by mouth on weekly basis given every 3 weeks. Status post 4 cycles. 6. Zometa 4 mg IV every 4 weeks.  7. Velcade 1.3 mg/M2 subcutaneous daily on a weekly basis with Decadron 20 mg by mouth on a weekly basis. First cycle expected on 06/21/2012. S/P 4 cycles. 8. Systemic chemotherapy with Carfilzomib, cyclophosphamide and Decadron. First cycle started on 08/09/2012. He is status post 19 cycles.   CURRENT THERAPY:  1) Systemic chemotherapy with Carfilzomib 36 MG/M2 on days 1, 2, 8, 9, 15 and 16 every 4 weeks, Pomalyst 4 mg by mouth daily for 21 days every 4 weeks and Decadron 40 mg by mouth weekly. First cycle started on 04/10/2014. Status post 4 cycles. 2) Zometa every 8 weeks.  3) Xarelto 20 mg by mouth daily, started 07/17/2014.  INTERVAL HISTORY: Eddie Thomas 69 y.o. male returns to the clinic today for followup visit. Today he is due for cycle #6 of Carfilzomib, Pomalyst and dexamethasone. He tolerates treatment fairly well except for mild pancytopenia. He denied  having any bleeding issues, and continues on xarelto daily. He denies fevers, chills, nausea, vomiting, or changes in bowel or bladder habits.  He denied having any significant weight loss or night sweats. He has no significant chest pain, shortness of breath, cough or hemoptysis.  MEDICAL HISTORY: Past Medical History  Diagnosis Date  . Multiple myeloma   . Diabetes mellitus 07/03/2011  . Hypertension 07/03/2011  . Hyperlipidemia 07/03/2011  . Colon polyps 2012  . Gastric ulcer     ALLERGIES:  has No Known Allergies.  MEDICATIONS:  Current Outpatient Prescriptions  Medication Sig Dispense Refill  . Cholecalciferol (VITAMIN D-3) 5000 UNITS TABS Take 1 tablet by mouth daily.    [name] dexamethasone (DECADRON) 4 MG tablet TAKE FIVE TABLETS BY MOUTH EVERY WEEK START WITH THE FIRST DOSE OF CHEMOTHERAPY 80 tablet 0  . lisinopril (PRINIVIL,ZESTRIL) 10 MG tablet Take 5 mg by mouth every evening.     [name] oxyCODONE-acetaminophen (PERCOCET/ROXICET) 5-325 MG per tablet Take 1 tablet by mouth every 6 (six) hours as needed for severe pain. 30 tablet 0  . pantoprazole (PROTONIX) 40 MG tablet Take 1 tablet (40 mg total) by mouth 2 (two) times daily. 60 tablet 11  . pioglitazone (ACTOS) 15 MG tablet Take 15 mg by mouth daily.    . pomalidomide (POMALYST) 4 MG capsule Take 1 capsule (4 mg total) by mouth daily. Take with water on days 1-21. Repeat every 28 days.authorization number [id number] on 07/19/14 21 capsule 0  .  prochlorperazine (COMPAZINE) 10 MG tablet Take 10 mg by mouth every 6 (six) hours as needed for nausea or vomiting.    . rivaroxaban (XARELTO) 20 MG TABS tablet Take 1 tablet (20 mg total) by mouth daily with supper. 30 tablet 1  . simvastatin (ZOCOR) 10 MG tablet Take 10 mg by mouth at bedtime.      . temazepam (RESTORIL) 15 MG capsule Take 15 mg by mouth at bedtime as needed for sleep.    . valACYclovir (VALTREX) 500 MG tablet TAKE ONE CAPLET BY MOUTH ONCE DAILY 30 tablet 3  . VIAGRA 50 MG tablet  Take 50 mg by mouth daily as needed for erectile dysfunction.      No current facility-administered medications for this visit.    SURGICAL HISTORY:  Past Surgical History  Procedure Laterality Date  . Limbal stem cell transplant    . Humerus fracture surgery      right  . 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    REVIEW OF SYSTEMS:  Constitutional: positive for fatigue Eyes: negative Ears, nose, mouth, throat, and face: negative Respiratory: negative Cardiovascular: negative Gastrointestinal: negative Genitourinary:negative Integument/breast: negative Hematologic/lymphatic: negative Musculoskeletal:negative Neurological: negative Behavioral/Psych: negative Endocrine: negative Allergic/Immunologic: negative   PHYSICAL EXAMINATION: General appearance: alert, cooperative and no distress Head: Normocephalic, without obvious abnormality, atraumatic Neck: no adenopathy, no JVD, supple, symmetrical, trachea midline and thyroid not enlarged, symmetric, no tenderness/mass/nodules Lymph nodes: Cervical, supraclavicular, and axillary nodes normal. Resp: clear to auscultation bilaterally Back: symmetric, no curvature. ROM normal. No CVA tenderness. Cardio: regular rate and rhythm, S1, S2 normal, no murmur, click, rub or gallop GI: soft, non-tender; bowel sounds normal; no masses,  no organomegaly Extremities: extremities normal, atraumatic, no cyanosis or edema Neurologic: Alert and oriented X 3, normal strength and tone. Normal symmetric reflexes. Normal coordination and gait  ECOG PERFORMANCE STATUS: 1 - Symptomatic but completely ambulatory  Blood pressure 143/64, pulse 66, temperature 97.9 F (36.6 C), temperature source Oral, resp. rate 18, height _0  (1.854 m), weight 173 lb 4.8 oz (78.608 kg).  LABORATORY DATA: Lab Results  Component Value Date   WBC 3.4* 08/28/2014   HGB 9.2* 08/28/2014   HCT 30.1* 08/28/2014   MCV 82.8 08/28/2014   PLT 220  08/28/2014      Chemistry      Component Value Date/Time   NA 140 08/28/2014 0934   NA 139 08/30/2013 0935   K 4.0 08/28/2014 0934   K 4.1 08/30/2013 0935   CL 101 08/30/2013 0935   CL 103 07/12/2012 0907   CO2 25 08/28/2014 0934   CO2 26 08/30/2013 0935   BUN 10.5 08/28/2014 0934   BUN 11 08/30/2013 0935   CREATININE 1.0 08/28/2014 0934   CREATININE 1.03 08/30/2013 0935      Component Value Date/Time   CALCIUM 8.7 08/28/2014 0934   CALCIUM 9.2 08/30/2013 0935   ALKPHOS 39* 08/28/2014 0934   ALKPHOS 39 08/30/2013 0935   AST 13 08/28/2014 0934   AST 13 08/30/2013 0935   ALT 14 08/28/2014 0934   ALT 9 08/30/2013 0935   BILITOT 2.17* 08/28/2014 0934   BILITOT 1.3* 08/30/2013 0935     Other lab results:   RADIOGRAPHIC STUDIES: Ir Fluoro Guide Cv Line Left  08/11/2014   CLINICAL DATA:  Multiple myeloma  EXAM: LEFT INTERNAL JUGULAR SINGLE LUMEN POWER PORT CATHETER INSERTION  Date:  7/22/20167/22/2016 4:24 pm  Radiologist:  Jerilynn Mages. Daryll Brod, MD  Guidance:  Ultrasound and fluoroscopic  FLUOROSCOPY TIME:  36 seconds  MEDICATIONS AND MEDICAL HISTORY: 2 g Ancefadministered within 1 hour of the procedure.4 mg Versed, 50 mcg fentanyl  ANESTHESIA/SEDATION: 30 minutes  CONTRAST:  None  COMPLICATIONS: None immediate  PROCEDURE: Informed consent was obtained from the patient following explanation of the procedure, risks, benefits and alternatives. The patient understands, agrees and consents for the procedure. All questions were addressed. A time out was performed.  Maximal barrier sterile technique utilized including caps, mask, sterile gowns, sterile gloves, large sterile drape, hand hygiene, and 2% chlorhexidine scrub.  Under sterile conditions and local anesthesia, left internal jugular micropuncture venous access was performed. Access was performed with ultrasound. Images were obtained for documentation. A guide wire was inserted followed by a transitional dilator. This allowed insertion of a  guide wire and catheter into the IVC. Measurements were obtained from the SVC / RA junction back to the left IJ venotomy site. In the left infraclavicular chest, a subcutaneous pocket was created over the second anterior rib. This was done under sterile conditions and local anesthesia. 1% lidocaine with epinephrine was utilized for this. A 2.5 cm incision was made in the skin. Blunt dissection was performed to create a subcutaneous pocket over the right pectoralis major muscle. The pocket was flushed with saline vigorously. There was adequate hemostasis. The port catheter was assembled and checked for leakage. The port catheter was secured in the pocket with two retention sutures. The tubing was tunneled subcutaneously to the left venotomy site and inserted into the SVC/RA junction through a valved peel-away sheath. Position was confirmed with fluoroscopy. Images were obtained for documentation. The patient tolerated the procedure well. No immediate complications. Incisions were closed in a two layer fashion with 4 - 0 Vicryl suture. Dermabond was applied to the skin. The port catheter was accessed, blood was aspirated followed by saline and heparin flushes. Needle was removed. A dry sterile dressing was applied.  IMPRESSION: Ultrasound and fluoroscopically guided left internal jugular single lumen power port catheter insertion. Tip in the SVC/RA junction. Catheter ready for use.   Electronically Signed   By: Jerilynn Mages.  Shick M.D.   On: 08/11/2014 16:35   Ir US Guide Vasc Access Left  08/11/2014   CLINICAL DATA:  Multiple myeloma  EXAM: LEFT INTERNAL JUGULAR SINGLE LUMEN POWER PORT CATHETER INSERTION  Date:  7/22/20167/22/2016 4:24 pm  Radiologist:  M. Daryll Brod, MD  Guidance:  Ultrasound and fluoroscopic  FLUOROSCOPY TIME:  36 seconds  MEDICATIONS AND MEDICAL HISTORY: 2 g Ancefadministered within 1 hour of the procedure.4 mg Versed, 50 mcg fentanyl  ANESTHESIA/SEDATION: 30 minutes  CONTRAST:  None  COMPLICATIONS:  None immediate  PROCEDURE: Informed consent was obtained from the patient following explanation of the procedure, risks, benefits and alternatives. The patient understands, agrees and consents for the procedure. All questions were addressed. A time out was performed.  Maximal barrier sterile technique utilized including caps, mask, sterile gowns, sterile gloves, large sterile drape, hand hygiene, and 2% chlorhexidine scrub.  Under sterile conditions and local anesthesia, left internal jugular micropuncture venous access was performed. Access was performed with ultrasound. Images were obtained for documentation. A guide wire was inserted followed by a transitional dilator. This allowed insertion of a guide wire and catheter into the IVC. Measurements were obtained from the SVC / RA junction back to the left IJ venotomy site. In the left infraclavicular chest, a subcutaneous pocket was created over the second anterior rib. This was done under sterile conditions  and local anesthesia. 1% lidocaine with epinephrine was utilized for this. A 2.5 cm incision was made in the skin. Blunt dissection was performed to create a subcutaneous pocket over the right pectoralis major muscle. The pocket was flushed with saline vigorously. There was adequate hemostasis. The port catheter was assembled and checked for leakage. The port catheter was secured in the pocket with two retention sutures. The tubing was tunneled subcutaneously to the left venotomy site and inserted into the SVC/RA junction through a valved peel-away sheath. Position was confirmed with fluoroscopy. Images were obtained for documentation. The patient tolerated the procedure well. No immediate complications. Incisions were closed in a two layer fashion with 4 - 0 Vicryl suture. Dermabond was applied to the skin. The port catheter was accessed, blood was aspirated followed by saline and heparin flushes. Needle was removed. A dry sterile dressing was applied.   IMPRESSION: Ultrasound and fluoroscopically guided left internal jugular single lumen power port catheter insertion. Tip in the SVC/RA junction. Catheter ready for use.   Electronically Signed   By: Jerilynn Mages.  Shick M.D.   On: 08/11/2014 16:35   Ir Radiologist Eval & Mgmt  08/04/2014   Patient Information   Patient Name Sex DOB SSN  Eddie, Thomas Male [birth year] [id number]  Consult Note by Arne Cleveland, MD at 07/31/2014 10:49 AM   Author: Arne Cleveland, MD Service: Radiology Author Type: Physician  Filed: 07/31/2014 1:14 PM Note Time: 07/31/2014 10:49 AM Status: Signed  Editor: Arne Cleveland, MD (Physician)    Expand All Collapse All    Reason for Consult: Port infection Referring Physician: Wrigley Thomas is an 69 y.o. male.  HPI: Patient with multiple myeloma. Port catheter placed for chemotherapy 06/03/2013. Unfortunately the port pocket got infected, so the port was removed from the 06/20/2014. We started with iodoform gauze dressings, then changed to wet to dry. Today he comes for routine follow-up and site evaluation.   Past Medical History Diagnosis Date . Multiple myeloma  . Diabetes mellitus 07/03/2011 . Hypertension 07/03/2011 . Hyperlipidemia 07/03/2011 . Colon polyps 2012 . Gastric ulcer     Past Surgical History Procedure Laterality Date . Limbal stem cell transplant   . Humerus fracture surgery     right . Limbal stem cell transplant  2012   for multiple myeloma    Family History Problem Relation Age of Onset . Diabetes Mother  . Hyperlipidemia Mother  . Diabetes Sister  . Diabetes Brother  . Colon cancer Maternal Uncle    Social History:  reports that he has never smoked. He has never used smokeless tobacco. He reports that he does not drink alcohol or use illicit drugs.  Allergies: No Known Allergies  Medications: I have reviewed the patient's current medications.    Lab Results Last 48 Hours    Results for  orders placed or performed in visit on 07/31/14 (from the past 48 hour(s)) CBC with Differential Status: Abnormal  Collection Time: 07/31/14 8:55 AM Result Value Ref Range  WBC 3.1 (L) 4.0 - 10.3 10e3/uL  NEUT# 1.7 1.5 - 6.5 10e3/uL  HGB 8.4 (L) 13.0 - 17.1 g/dL  HCT 27.3 (L) 38.4 - 49.9 %  Platelets 216 140 - 400 10e3/uL  MCV 82.7 79.3 - 98.0 fL  MCH 25.4 (L) 27.2 - 33.4 pg  MCHC 30.8 (L) 32.0 - 36.0 g/dL  RBC 3.30 (L) 4.20 - 5.82 10e6/uL  RDW 18.2 (H) 11.0 - 14.6 %  lymph# 0.7 (L) 0.9 - 3.3 10e3/uL  MONO# 0.6 0.1 - 0.9 10e3/uL  Eosinophils Absolute 0.1 0.0 - 0.5 10e3/uL  Basophils Absolute 0.1 0.0 - 0.1 10e3/uL  NEUT% 55.0 39.0 - 75.0 %  LYMPH% 21.4 14.0 - 49.0 %  MONO% 19.5 (H) 0.0 - 14.0 %  EOS% 2.4 0.0 - 7.0 %  BASO% 1.7 0.0 - 2.0 % Comprehensive metabolic panel (Cmet) - [facility] Status: Abnormal  Collection Time: 07/31/14 8:55 AM Result Value Ref Range  Sodium 139 136 - 145 mEq/L  Potassium 3.7 3.5 - 5.1 mEq/L  Chloride 110 (H) 98 - 109 mEq/L  CO2 23 22 - 29 mEq/L  Glucose 141 (H) 70 - 140 mg/dl  BUN 11.4 7.0 - 26.0 mg/dL  Creatinine 0.9 0.7 - 1.3 mg/dL  Total Bilirubin 2.27 (H) 0.20 - 1.20 mg/dL  Alkaline Phosphatase 36 (L) 40 - 150 U/L  AST 18 5 - 34 U/L  ALT 16 0 - 55 U/L  Total Protein 5.3 (L) 6.4 - 8.3 g/dL  Albumin 3.6 3.5 - 5.0 g/dL  Calcium 9.0 8.4 - 10.4 mg/dL  Anion Gap 6 3 - 11 mEq/L  EGFR >90 >90 ml/min/1.73 m2   Comment: eGFR is calculated using the CKD-EPI Creatinine Equation (2009)      Imaging Results (Last 48 hours)   No results found.   ROS There were no vitals taken for this visit. Physical Exam the port site is well-healed. There is a 2 mm Centrally. There is no material on the overlying gauze bandage. There is no tenderness redness swelling.  Assessment/Plan: The port site has healed. I told the patient to put a Band-Aid over the  scar if it is irritating, otherwise can be left open to air. He is interested in having a new port placed on the contralateral side.  HASSELL III, DAYNE DANIEL 07/31/2014, 10:49 AM       ASSESSMENT AND PLAN: This is a very pleasant 69 years old [place] American male with history of multiple myeloma currently undergoing systemic chemotherapy with Carfilzomib, cyclophosphamide and Decadron status post 19 cycles. This was discontinued secondary to disease progression. The patient has a started on systemic chemotherapy with Carfilzomib, Pomalyst and Decadron status post 5 cycles. He is tolerating his treatment fairly well except for mild pancytopenia We will proceed with cycle #6 today as a scheduled. The patient will come back for follow-up visit in 4 weeks before starting cycle #7 for evaluation. He'll continue on Zometa every 2 months, next due today. For the venous thrombosis, he will continue on Xarelto 20 mg by mouth daily. He was advised to call immediately if he has any concerning symptoms in the interval.  The patient voices understanding of current disease status and treatment options and is in agreement with the current care plan. All questions were answered. The patient knows to call the clinic with any problems, questions or concerns. We can certainly see the patient much sooner if necessary.    Laurie Panda, NP 08/28/2014

## 2014-08-28 NOTE — Patient Instructions (Signed)
[facility] Discharge Instructions for Patients Receiving Chemotherapy  Today you received the following chemotherapy agents Kyprolis.  To help prevent nausea and vomiting after your treatment, we encourage you to take your nausea medication as prescribed.   If you develop nausea and vomiting that is not controlled by your nausea medication, call the clinic.   BELOW ARE SYMPTOMS THAT SHOULD BE REPORTED IMMEDIATELY:  *FEVER GREATER THAN 100.5 F  *CHILLS WITH OR WITHOUT FEVER  NAUSEA AND VOMITING THAT IS NOT CONTROLLED WITH YOUR NAUSEA MEDICATION  *UNUSUAL SHORTNESS OF BREATH  *UNUSUAL BRUISING OR BLEEDING  TENDERNESS IN MOUTH AND THROAT WITH OR WITHOUT PRESENCE OF ULCERS  *URINARY PROBLEMS  *BOWEL PROBLEMS  UNUSUAL RASH Items with * indicate a potential emergency and should be followed up as soon as possible.  Feel free to call the clinic you have any questions or concerns. The clinic phone number is [phone].  Please show the CHEMO ALERT CARD at check-in to the Emergency Department and triage nurse.   

## 2014-08-28 NOTE — Telephone Encounter (Signed)
Gave and printed appt sched and avs fo rpt for Aug and Sept °

## 2014-08-29 ENCOUNTER — Ambulatory Visit (HOSPITAL_BASED_OUTPATIENT_CLINIC_OR_DEPARTMENT_OTHER): Payer: Medicare Other

## 2014-08-29 VITALS — BP 129/67 | HR 58 | Temp 96.8°F

## 2014-08-29 DIAGNOSIS — Z5112 Encounter for antineoplastic immunotherapy: Secondary | ICD-10-CM | POA: Diagnosis present

## 2014-08-29 DIAGNOSIS — C9002 Multiple myeloma in relapse: Secondary | ICD-10-CM | POA: Diagnosis not present

## 2014-08-29 DIAGNOSIS — C9000 Multiple myeloma not having achieved remission: Secondary | ICD-10-CM

## 2014-08-29 MED ORDER — DEXAMETHASONE SODIUM PHOSPHATE 100 MG/10ML IJ SOLN
Freq: Once | INTRAMUSCULAR | Status: AC
  Administered 2014-08-29: 10:00:00 via INTRAVENOUS
  Filled 2014-08-29: qty 4

## 2014-08-29 MED ORDER — SODIUM CHLORIDE 0.9 % IV SOLN
Freq: Once | INTRAVENOUS | Status: AC
  Administered 2014-08-29: 09:00:00 via INTRAVENOUS

## 2014-08-29 MED ORDER — SODIUM CHLORIDE 0.9 % IJ SOLN
10.0000 mL | INTRAMUSCULAR | Status: DC | PRN
  Administered 2014-08-29: 10 mL
  Filled 2014-08-29: qty 10

## 2014-08-29 MED ORDER — HEPARIN SOD (PORK) LOCK FLUSH 100 UNIT/ML IV SOLN
500.0000 [IU] | Freq: Once | INTRAVENOUS | Status: AC | PRN
  Administered 2014-08-29: 500 [IU]
  Filled 2014-08-29: qty 5

## 2014-08-29 MED ORDER — DEXTROSE 5 % IV SOLN
36.0000 mg/m2 | Freq: Once | INTRAVENOUS | Status: AC
  Administered 2014-08-29: 72 mg via INTRAVENOUS
  Filled 2014-08-29: qty 36

## 2014-08-29 NOTE — Patient Instructions (Signed)
[facility] Discharge Instructions for Patients Receiving Chemotherapy  Today you received the following chemotherapy agents Kyprolis.  To help prevent nausea and vomiting after your treatment, we encourage you to take your nausea medication as prescribed.   If you develop nausea and vomiting that is not controlled by your nausea medication, call the clinic.   BELOW ARE SYMPTOMS THAT SHOULD BE REPORTED IMMEDIATELY:  *FEVER GREATER THAN 100.5 F  *CHILLS WITH OR WITHOUT FEVER  NAUSEA AND VOMITING THAT IS NOT CONTROLLED WITH YOUR NAUSEA MEDICATION  *UNUSUAL SHORTNESS OF BREATH  *UNUSUAL BRUISING OR BLEEDING  TENDERNESS IN MOUTH AND THROAT WITH OR WITHOUT PRESENCE OF ULCERS  *URINARY PROBLEMS  *BOWEL PROBLEMS  UNUSUAL RASH Items with * indicate a potential emergency and should be followed up as soon as possible.  Feel free to call the clinic you have any questions or concerns. The clinic phone number is [phone].  Please show the CHEMO ALERT CARD at check-in to the Emergency Department and triage nurse.   

## 2014-09-01 ENCOUNTER — Other Ambulatory Visit: Payer: Self-pay | Admitting: *Deleted

## 2014-09-01 DIAGNOSIS — C9000 Multiple myeloma not having achieved remission: Secondary | ICD-10-CM

## 2014-09-04 ENCOUNTER — Ambulatory Visit (HOSPITAL_BASED_OUTPATIENT_CLINIC_OR_DEPARTMENT_OTHER): Payer: Medicare Other

## 2014-09-04 ENCOUNTER — Other Ambulatory Visit: Payer: Medicare Other

## 2014-09-04 ENCOUNTER — Other Ambulatory Visit (HOSPITAL_BASED_OUTPATIENT_CLINIC_OR_DEPARTMENT_OTHER): Payer: Medicare Other

## 2014-09-04 VITALS — BP 159/70 | HR 62 | Temp 97.3°F | Resp 18

## 2014-09-04 DIAGNOSIS — Z5111 Encounter for antineoplastic chemotherapy: Secondary | ICD-10-CM

## 2014-09-04 DIAGNOSIS — C9002 Multiple myeloma in relapse: Secondary | ICD-10-CM

## 2014-09-04 DIAGNOSIS — C9000 Multiple myeloma not having achieved remission: Secondary | ICD-10-CM

## 2014-09-04 LAB — CBC WITH DIFFERENTIAL/PLATELET
BASO%: 0.4 % (ref 0.0–2.0)
Basophils Absolute: 0 10*3/uL (ref 0.0–0.1)
EOS%: 3 % (ref 0.0–7.0)
Eosinophils Absolute: 0.1 10*3/uL (ref 0.0–0.5)
HEMATOCRIT: 27.7 % — AB (ref 38.4–49.9)
HGB: 8.5 g/dL — ABNORMAL LOW (ref 13.0–17.1)
LYMPH#: 0.5 10*3/uL — AB (ref 0.9–3.3)
LYMPH%: 18.4 % (ref 14.0–49.0)
MCH: 25.2 pg — ABNORMAL LOW (ref 27.2–33.4)
MCHC: 30.7 g/dL — ABNORMAL LOW (ref 32.0–36.0)
MCV: 82.1 fL (ref 79.3–98.0)
MONO#: 0.2 10*3/uL (ref 0.1–0.9)
MONO%: 8.1 % (ref 0.0–14.0)
NEUT#: 1.9 10*3/uL (ref 1.5–6.5)
NEUT%: 70.1 % (ref 39.0–75.0)
Platelets: 133 10*3/uL — ABNORMAL LOW (ref 140–400)
RBC: 3.37 10*6/uL — AB (ref 4.20–5.82)
RDW: 17.3 % — AB (ref 11.0–14.6)
WBC: 2.7 10*3/uL — ABNORMAL LOW (ref 4.0–10.3)

## 2014-09-04 LAB — COMPREHENSIVE METABOLIC PANEL (CC13)
ALT: 13 U/L (ref 0–55)
AST: 10 U/L (ref 5–34)
Albumin: 3.6 g/dL (ref 3.5–5.0)
Alkaline Phosphatase: 37 U/L — ABNORMAL LOW (ref 40–150)
Anion Gap: 7 mEq/L (ref 3–11)
BUN: 14 mg/dL (ref 7.0–26.0)
CALCIUM: 8.8 mg/dL (ref 8.4–10.4)
CO2: 24 meq/L (ref 22–29)
CREATININE: 0.9 mg/dL (ref 0.7–1.3)
Chloride: 107 mEq/L (ref 98–109)
EGFR: >90 mL/min/{1.73_m2} (ref >90)
GLUCOSE: 119 mg/dL (ref 70–140)
POTASSIUM: 4.2 meq/L (ref 3.5–5.1)
SODIUM: 138 meq/L (ref 136–145)
Total Bilirubin: 1.74 mg/dL — ABNORMAL HIGH (ref 0.20–1.20)
Total Protein: 5.3 g/dL — ABNORMAL LOW (ref 6.4–8.3)

## 2014-09-04 MED ORDER — HEPARIN SOD (PORK) LOCK FLUSH 100 UNIT/ML IV SOLN
500.0000 [IU] | Freq: Once | INTRAVENOUS | Status: AC | PRN
  Administered 2014-09-04: 500 [IU]
  Filled 2014-09-04: qty 5

## 2014-09-04 MED ORDER — DEXTROSE 5 % IV SOLN
36.0000 mg/m2 | Freq: Once | INTRAVENOUS | Status: AC
  Administered 2014-09-04: 72 mg via INTRAVENOUS
  Filled 2014-09-04: qty 36

## 2014-09-04 MED ORDER — SODIUM CHLORIDE 0.9 % IJ SOLN
10.0000 mL | INTRAMUSCULAR | Status: DC | PRN
  Administered 2014-09-04: 10 mL
  Filled 2014-09-04: qty 10

## 2014-09-04 MED ORDER — SODIUM CHLORIDE 0.9 % IV SOLN: Freq: Once | INTRAVENOUS | Status: DC

## 2014-09-04 MED ORDER — SODIUM CHLORIDE 0.9 % IV SOLN
Freq: Once | INTRAVENOUS | Status: AC
  Administered 2014-09-04: 11:00:00 via INTRAVENOUS
  Filled 2014-09-04: qty 4

## 2014-09-04 MED ORDER — SODIUM CHLORIDE 0.9 % IV SOLN
Freq: Once | INTRAVENOUS | Status: AC
  Administered 2014-09-04: 10:00:00 via INTRAVENOUS

## 2014-09-04 NOTE — Patient Instructions (Signed)
[facility] Discharge Instructions for Patients Receiving Chemotherapy  Today you received the following chemotherapy agents Kyprolis.  To help prevent nausea and vomiting after your treatment, we encourage you to take your nausea medication as prescribed.   If you develop nausea and vomiting that is not controlled by your nausea medication, call the clinic.   BELOW ARE SYMPTOMS THAT SHOULD BE REPORTED IMMEDIATELY:  *FEVER GREATER THAN 100.5 F  *CHILLS WITH OR WITHOUT FEVER  NAUSEA AND VOMITING THAT IS NOT CONTROLLED WITH YOUR NAUSEA MEDICATION  *UNUSUAL SHORTNESS OF BREATH  *UNUSUAL BRUISING OR BLEEDING  TENDERNESS IN MOUTH AND THROAT WITH OR WITHOUT PRESENCE OF ULCERS  *URINARY PROBLEMS  *BOWEL PROBLEMS  UNUSUAL RASH Items with * indicate a potential emergency and should be followed up as soon as possible.  Feel free to call the clinic you have any questions or concerns. The clinic phone number is [phone].  Please show the CHEMO ALERT CARD at check-in to the Emergency Department and triage nurse.   

## 2014-09-05 ENCOUNTER — Ambulatory Visit (HOSPITAL_BASED_OUTPATIENT_CLINIC_OR_DEPARTMENT_OTHER): Payer: Medicare Other

## 2014-09-05 VITALS — BP 149/71 | HR 51 | Temp 97.1°F | Resp 18

## 2014-09-05 DIAGNOSIS — Z5112 Encounter for antineoplastic immunotherapy: Secondary | ICD-10-CM | POA: Diagnosis present

## 2014-09-05 DIAGNOSIS — C9002 Multiple myeloma in relapse: Secondary | ICD-10-CM

## 2014-09-05 DIAGNOSIS — C9000 Multiple myeloma not having achieved remission: Secondary | ICD-10-CM

## 2014-09-05 MED ORDER — SODIUM CHLORIDE 0.9 % IJ SOLN
10.0000 mL | INTRAMUSCULAR | Status: DC | PRN
  Administered 2014-09-05: 10 mL
  Filled 2014-09-05: qty 10

## 2014-09-05 MED ORDER — SODIUM CHLORIDE 0.9 % IV SOLN
Freq: Once | INTRAVENOUS | Status: AC
  Administered 2014-09-05: 10:00:00 via INTRAVENOUS
  Filled 2014-09-05: qty 4

## 2014-09-05 MED ORDER — SODIUM CHLORIDE 0.9 % IV SOLN
Freq: Once | INTRAVENOUS | Status: AC
  Administered 2014-09-05: 10:00:00 via INTRAVENOUS

## 2014-09-05 MED ORDER — DEXTROSE 5 % IV SOLN
36.0000 mg/m2 | Freq: Once | INTRAVENOUS | Status: AC
  Administered 2014-09-05: 72 mg via INTRAVENOUS
  Filled 2014-09-05: qty 36

## 2014-09-05 MED ORDER — HEPARIN SOD (PORK) LOCK FLUSH 100 UNIT/ML IV SOLN
500.0000 [IU] | Freq: Once | INTRAVENOUS | Status: AC | PRN
  Administered 2014-09-05: 500 [IU]
  Filled 2014-09-05: qty 5

## 2014-09-05 NOTE — Patient Instructions (Signed)
[facility] Discharge Instructions for Patients Receiving Chemotherapy  Today you received the following chemotherapy agents kyprolis  To help prevent nausea and vomiting after your treatment, we encourage you to take your nausea medication.   If you develop nausea and vomiting that is not controlled by your nausea medication, call the clinic.   BELOW ARE SYMPTOMS THAT SHOULD BE REPORTED IMMEDIATELY:  *FEVER GREATER THAN 100.5 F  *CHILLS WITH OR WITHOUT FEVER  NAUSEA AND VOMITING THAT IS NOT CONTROLLED WITH YOUR NAUSEA MEDICATION  *UNUSUAL SHORTNESS OF BREATH  *UNUSUAL BRUISING OR BLEEDING  TENDERNESS IN MOUTH AND THROAT WITH OR WITHOUT PRESENCE OF ULCERS  *URINARY PROBLEMS  *BOWEL PROBLEMS  UNUSUAL RASH Items with * indicate a potential emergency and should be followed up as soon as possible.  Feel free to call the clinic you have any questions or concerns. The clinic phone number is [phone].  Please show the [place] at check-in to the Emergency Department and triage nurse.

## 2014-09-08 ENCOUNTER — Other Ambulatory Visit: Payer: Self-pay | Admitting: Medical Oncology

## 2014-09-08 DIAGNOSIS — C9000 Multiple myeloma not having achieved remission: Secondary | ICD-10-CM

## 2014-09-08 MED ORDER — POMALIDOMIDE 4 MG PO CAPS: 4.0000 mg | ORAL_CAPSULE | Freq: Every day | ORAL | Status: DC

## 2014-09-08 NOTE — Progress Notes (Signed)
pomalyst refill rx faxed.

## 2014-09-11 ENCOUNTER — Ambulatory Visit (HOSPITAL_BASED_OUTPATIENT_CLINIC_OR_DEPARTMENT_OTHER): Payer: Medicare Other

## 2014-09-11 ENCOUNTER — Other Ambulatory Visit: Payer: Self-pay | Admitting: *Deleted

## 2014-09-11 ENCOUNTER — Other Ambulatory Visit (HOSPITAL_BASED_OUTPATIENT_CLINIC_OR_DEPARTMENT_OTHER): Payer: Medicare Other

## 2014-09-11 VITALS — BP 158/69 | HR 58 | Temp 98.2°F

## 2014-09-11 DIAGNOSIS — C9002 Multiple myeloma in relapse: Secondary | ICD-10-CM

## 2014-09-11 DIAGNOSIS — C9000 Multiple myeloma not having achieved remission: Secondary | ICD-10-CM

## 2014-09-11 DIAGNOSIS — Z5112 Encounter for antineoplastic immunotherapy: Secondary | ICD-10-CM

## 2014-09-11 LAB — COMPREHENSIVE METABOLIC PANEL (CC13)
ALT: 15 U/L (ref 0–55)
ANION GAP: 6 meq/L (ref 3–11)
AST: 10 U/L (ref 5–34)
Albumin: 3.5 g/dL (ref 3.5–5.0)
Alkaline Phosphatase: 37 U/L — ABNORMAL LOW (ref 40–150)
BUN: 13.2 mg/dL (ref 7.0–26.0)
CALCIUM: 8.5 mg/dL (ref 8.4–10.4)
CHLORIDE: 109 meq/L (ref 98–109)
CO2: 23 mEq/L (ref 22–29)
CREATININE: 0.9 mg/dL (ref 0.7–1.3)
Glucose: 126 mg/dl (ref 70–140)
Potassium: 4.3 mEq/L (ref 3.5–5.1)
Sodium: 138 mEq/L (ref 136–145)
Total Bilirubin: 1.91 mg/dL — ABNORMAL HIGH (ref 0.20–1.20)
Total Protein: 5.2 g/dL — ABNORMAL LOW (ref 6.4–8.3)

## 2014-09-11 LAB — CBC WITH DIFFERENTIAL/PLATELET
BASO%: 0.7 % (ref 0.0–2.0)
BASOS ABS: 0 10*3/uL (ref 0.0–0.1)
EOS ABS: 0.2 10*3/uL (ref 0.0–0.5)
EOS%: 4.6 % (ref 0.0–7.0)
HEMATOCRIT: 28.2 % — AB (ref 38.4–49.9)
HGB: 8.7 g/dL — ABNORMAL LOW (ref 13.0–17.1)
LYMPH#: 0.6 10*3/uL — AB (ref 0.9–3.3)
LYMPH%: 15.3 % (ref 14.0–49.0)
MCH: 25.5 pg — AB (ref 27.2–33.4)
MCHC: 31 g/dL — AB (ref 32.0–36.0)
MCV: 82.2 fL (ref 79.3–98.0)
MONO#: 0.6 10*3/uL (ref 0.1–0.9)
MONO%: 15.8 % — ABNORMAL HIGH (ref 0.0–14.0)
NEUT#: 2.6 10*3/uL (ref 1.5–6.5)
NEUT%: 63.6 % (ref 39.0–75.0)
PLATELETS: 129 10*3/uL — AB (ref 140–400)
RBC: 3.43 10*6/uL — ABNORMAL LOW (ref 4.20–5.82)
RDW: 16.8 % — ABNORMAL HIGH (ref 11.0–14.6)
WBC: 4 10*3/uL (ref 4.0–10.3)

## 2014-09-11 MED ORDER — SODIUM CHLORIDE 0.9 % IJ SOLN
10.0000 mL | INTRAMUSCULAR | Status: DC | PRN
  Administered 2014-09-11: 10 mL
  Filled 2014-09-11: qty 10

## 2014-09-11 MED ORDER — SODIUM CHLORIDE 0.9 % IV SOLN: Freq: Once | INTRAVENOUS | Status: DC

## 2014-09-11 MED ORDER — HEPARIN SOD (PORK) LOCK FLUSH 100 UNIT/ML IV SOLN
500.0000 [IU] | Freq: Once | INTRAVENOUS | Status: AC | PRN
  Administered 2014-09-11: 500 [IU]
  Filled 2014-09-11: qty 5

## 2014-09-11 MED ORDER — SODIUM CHLORIDE 0.9 % IV SOLN
Freq: Once | INTRAVENOUS | Status: AC
  Administered 2014-09-11: 10:00:00 via INTRAVENOUS

## 2014-09-11 MED ORDER — SODIUM CHLORIDE 0.9 % IV SOLN
Freq: Once | INTRAVENOUS | Status: AC
  Administered 2014-09-11: 11:00:00 via INTRAVENOUS
  Filled 2014-09-11: qty 4

## 2014-09-11 MED ORDER — DEXTROSE 5 % IV SOLN
36.0000 mg/m2 | Freq: Once | INTRAVENOUS | Status: AC
  Administered 2014-09-11: 72 mg via INTRAVENOUS
  Filled 2014-09-11: qty 36

## 2014-09-11 NOTE — Patient Instructions (Signed)
[facility] Discharge Instructions for Patients Receiving Chemotherapy  Today you received the following chemotherapy agents: Kyprolis. To help prevent nausea and vomiting after your treatment, we encourage you to take your nausea medication:  Compazine 10 mg every 6 hours as needed.   If you develop nausea and vomiting that is not controlled by your nausea medication, call the clinic.   BELOW ARE SYMPTOMS THAT SHOULD BE REPORTED IMMEDIATELY:  *FEVER GREATER THAN 100.5 F  *CHILLS WITH OR WITHOUT FEVER  NAUSEA AND VOMITING THAT IS NOT CONTROLLED WITH YOUR NAUSEA MEDICATION  *UNUSUAL SHORTNESS OF BREATH  *UNUSUAL BRUISING OR BLEEDING  TENDERNESS IN MOUTH AND THROAT WITH OR WITHOUT PRESENCE OF ULCERS  *URINARY PROBLEMS  *BOWEL PROBLEMS  UNUSUAL RASH Items with * indicate a potential emergency and should be followed up as soon as possible.  Feel free to call the clinic you have any questions or concerns. The clinic phone number is [phone].  Please show the CHEMO ALERT CARD at check-in to the Emergency Department and triage nurse.   

## 2014-09-11 NOTE — Progress Notes (Signed)
OK to treat with total bili of 1.9 per Dr. Julien Nordmann.

## 2014-09-12 ENCOUNTER — Other Ambulatory Visit: Payer: Self-pay | Admitting: *Deleted

## 2014-09-12 ENCOUNTER — Ambulatory Visit (HOSPITAL_BASED_OUTPATIENT_CLINIC_OR_DEPARTMENT_OTHER): Payer: Medicare Other

## 2014-09-12 VITALS — BP 134/68 | HR 57 | Temp 98.0°F | Resp 20

## 2014-09-12 DIAGNOSIS — Z5112 Encounter for antineoplastic immunotherapy: Secondary | ICD-10-CM

## 2014-09-12 DIAGNOSIS — C9002 Multiple myeloma in relapse: Secondary | ICD-10-CM

## 2014-09-12 DIAGNOSIS — C9000 Multiple myeloma not having achieved remission: Secondary | ICD-10-CM

## 2014-09-12 MED ORDER — SODIUM CHLORIDE 0.9 % IJ SOLN
10.0000 mL | INTRAMUSCULAR | Status: DC | PRN
  Administered 2014-09-12: 10 mL
  Filled 2014-09-12: qty 10

## 2014-09-12 MED ORDER — DEXTROSE 5 % IV SOLN
36.0000 mg/m2 | Freq: Once | INTRAVENOUS | Status: AC
  Administered 2014-09-12: 72 mg via INTRAVENOUS
  Filled 2014-09-12: qty 36

## 2014-09-12 MED ORDER — HEPARIN SOD (PORK) LOCK FLUSH 100 UNIT/ML IV SOLN
500.0000 [IU] | Freq: Once | INTRAVENOUS | Status: AC | PRN
  Administered 2014-09-12: 500 [IU]
  Filled 2014-09-12: qty 5

## 2014-09-12 MED ORDER — POMALIDOMIDE 4 MG PO CAPS: 4.0000 mg | ORAL_CAPSULE | Freq: Every day | ORAL | Status: DC

## 2014-09-12 MED ORDER — SODIUM CHLORIDE 0.9 % IV SOLN
Freq: Once | INTRAVENOUS | Status: AC
  Administered 2014-09-12: 12:00:00 via INTRAVENOUS
  Filled 2014-09-12: qty 4

## 2014-09-12 MED ORDER — SODIUM CHLORIDE 0.9 % IV SOLN
Freq: Once | INTRAVENOUS | Status: AC
  Administered 2014-09-12: 11:00:00 via INTRAVENOUS

## 2014-09-12 NOTE — Patient Instructions (Signed)
[facility] Discharge Instructions for Patients Receiving Chemotherapy  Today you received the following chemotherapy agents: Kyprolis. To help prevent nausea and vomiting after your treatment, we encourage you to take your nausea medication:  Compazine 10 mg every 6 hours as needed.   If you develop nausea and vomiting that is not controlled by your nausea medication, call the clinic.   BELOW ARE SYMPTOMS THAT SHOULD BE REPORTED IMMEDIATELY:  *FEVER GREATER THAN 100.5 F  *CHILLS WITH OR WITHOUT FEVER  NAUSEA AND VOMITING THAT IS NOT CONTROLLED WITH YOUR NAUSEA MEDICATION  *UNUSUAL SHORTNESS OF BREATH  *UNUSUAL BRUISING OR BLEEDING  TENDERNESS IN MOUTH AND THROAT WITH OR WITHOUT PRESENCE OF ULCERS  *URINARY PROBLEMS  *BOWEL PROBLEMS  UNUSUAL RASH Items with * indicate a potential emergency and should be followed up as soon as possible.  Feel free to call the clinic you have any questions or concerns. The clinic phone number is [phone].  Please show the CHEMO ALERT CARD at check-in to the Emergency Department and triage nurse.   

## 2014-09-12 NOTE — Progress Notes (Signed)
Patient does not want extra hydration fluids.

## 2014-09-21 ENCOUNTER — Telehealth: Payer: Self-pay | Admitting: Medical Oncology

## 2014-09-21 ENCOUNTER — Other Ambulatory Visit: Payer: Self-pay | Admitting: Medical Oncology

## 2014-09-21 DIAGNOSIS — C9000 Multiple myeloma not having achieved remission: Secondary | ICD-10-CM

## 2014-09-21 NOTE — Telephone Encounter (Signed)
Faxed form -okay per [facility] to stop xarelto

## 2014-09-26 ENCOUNTER — Other Ambulatory Visit (HOSPITAL_BASED_OUTPATIENT_CLINIC_OR_DEPARTMENT_OTHER): Payer: Medicare Other

## 2014-09-26 ENCOUNTER — Ambulatory Visit (HOSPITAL_BASED_OUTPATIENT_CLINIC_OR_DEPARTMENT_OTHER): Payer: Medicare Other | Admitting: Physician Assistant

## 2014-09-26 ENCOUNTER — Ambulatory Visit: Payer: Medicare Other

## 2014-09-26 ENCOUNTER — Telehealth: Payer: Self-pay | Admitting: Internal Medicine

## 2014-09-26 ENCOUNTER — Encounter: Payer: Self-pay | Admitting: Physician Assistant

## 2014-09-26 VITALS — BP 128/62 | HR 67 | Temp 98.5°F | Resp 18 | Ht 73.0 in | Wt 175.8 lb

## 2014-09-26 DIAGNOSIS — C9000 Multiple myeloma not having achieved remission: Secondary | ICD-10-CM

## 2014-09-26 DIAGNOSIS — C9002 Multiple myeloma in relapse: Secondary | ICD-10-CM

## 2014-09-26 LAB — CBC WITH DIFFERENTIAL/PLATELET
BASO%: 2.1 % — AB (ref 0.0–2.0)
BASOS ABS: 0.1 10*3/uL (ref 0.0–0.1)
EOS%: 3 % (ref 0.0–7.0)
Eosinophils Absolute: 0.1 10*3/uL (ref 0.0–0.5)
HEMATOCRIT: 29.4 % — AB (ref 38.4–49.9)
HGB: 9.1 g/dL — ABNORMAL LOW (ref 13.0–17.1)
LYMPH%: 21.7 % (ref 14.0–49.0)
MCH: 25.6 pg — AB (ref 27.2–33.4)
MCHC: 30.9 g/dL — AB (ref 32.0–36.0)
MCV: 82.8 fL (ref 79.3–98.0)
MONO#: 0.6 10*3/uL (ref 0.1–0.9)
MONO%: 27 % — ABNORMAL HIGH (ref 0.0–14.0)
NEUT#: 1.1 10*3/uL — ABNORMAL LOW (ref 1.5–6.5)
NEUT%: 46.2 % (ref 39.0–75.0)
Platelets: 204 10*3/uL (ref 140–400)
RBC: 3.55 10*6/uL — AB (ref 4.20–5.82)
RDW: 16.9 % — ABNORMAL HIGH (ref 11.0–14.6)
WBC: 2.4 10*3/uL — ABNORMAL LOW (ref 4.0–10.3)
lymph#: 0.5 10*3/uL — ABNORMAL LOW (ref 0.9–3.3)

## 2014-09-26 LAB — COMPREHENSIVE METABOLIC PANEL (CC13)
ALT: 14 U/L (ref 0–55)
ANION GAP: 7 meq/L (ref 3–11)
AST: 14 U/L (ref 5–34)
Albumin: 3.8 g/dL (ref 3.5–5.0)
Alkaline Phosphatase: 37 U/L — ABNORMAL LOW (ref 40–150)
BUN: 11 mg/dL (ref 7.0–26.0)
CALCIUM: 8.8 mg/dL (ref 8.4–10.4)
CHLORIDE: 110 meq/L — AB (ref 98–109)
CO2: 23 meq/L (ref 22–29)
CREATININE: 1 mg/dL (ref 0.7–1.3)
EGFR: >90 mL/min/{1.73_m2} (ref >90)
Glucose: 116 mg/dl (ref 70–140)
POTASSIUM: 3.6 meq/L (ref 3.5–5.1)
Sodium: 140 mEq/L (ref 136–145)
Total Bilirubin: 2.35 mg/dL — ABNORMAL HIGH (ref 0.20–1.20)
Total Protein: 5.6 g/dL — ABNORMAL LOW (ref 6.4–8.3)

## 2014-09-26 NOTE — Telephone Encounter (Signed)
Gave adn prnted appt sched and avs fo rpt for Sept and OCT °

## 2014-09-26 NOTE — Progress Notes (Addendum)
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  2) the venous thrombosis diagnosed in June 2016  PRIOR THERAPY: :  1. Status post 6 cycles of systemic chemotherapy with Revlimid and Decadron, last dose was given 07/21/2010 with very good response. 2. 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 [name].  3. maintenance Revlimid at 10 mg by mouth daily status post 2 months. Therapy began 01/18/2011. 4. maintenance Revlimid at 15 mg by mouth daily with prophylactic dose Coumadin at 2 mg by mouth daily. 5. Systemic chemotherapy with Velcade at 1.3 mg per meter squared given on days 1, 4, 8 and 11 and Doxil at 30 mg per meter square given on day 4 and Decadron 40 mg by mouth on weekly basis given every 3 weeks. Status post 4 cycles. 6. Zometa 4 mg IV every 4 weeks.  7. Velcade 1.3 mg/M2 subcutaneous daily on a weekly basis with Decadron 20 mg by mouth on a weekly basis. First cycle expected on 06/21/2012. S/P 4 cycles. 8. Systemic chemotherapy with Carfilzomib, cyclophosphamide and Decadron. First cycle started on 08/09/2012. He is status post 19 cycles.   CURRENT THERAPY:  1) Systemic chemotherapy with Carfilzomib 36 MG/M2 on days 1, 2, 8, 9, 15 and 16 every 4 weeks, Pomalyst 4 mg by mouth daily for 21 days every 4 weeks and Decadron 40 mg by mouth weekly. First cycle started on 04/10/2014. Status post 6 cycles. 2) Zometa every 8 weeks.  3) Xarelto 20 mg by mouth daily, started 07/17/2014.  INTERVAL HISTORY: Eddie Thomas 69 y.o. male returns to the clinic today for followup visit. Today he is due for cycle #7 of Carfilzomib, Pomalyst and dexamethasone. He tolerates treatment fairly well except for mild pancytopenia. He denied  having any bleeding issues, and continues on xarelto daily. He is to have injections by [facility] on 10/06/2014 and is to stop his Xarelto 3 days prior to that appointment. He reports occasional tingling in his toes but nothing consistent. He denies fevers, chills, nausea, vomiting, or changes in bowel or bladder habits.  He denied having any significant weight loss or night sweats. He has no significant chest pain, shortness of breath, cough or hemoptysis. He states that he is to follow-up at [facility] this Friday regarding his multiple myeloma. They may recheck his protein studies at that appointment however if not we will draw set in the near future.  MEDICAL HISTORY: Past Medical History  Diagnosis Date  . Multiple myeloma   . Diabetes mellitus 07/03/2011  . Hypertension 07/03/2011  . Hyperlipidemia 07/03/2011  . Colon polyps 2012  . Gastric ulcer     ALLERGIES:  has No Known Allergies.  MEDICATIONS:  Current Outpatient Prescriptions  Medication Sig Dispense Refill  . Cholecalciferol (VITAMIN D-3) 5000 UNITS TABS Take 1 tablet by mouth daily.    [name] dexamethasone (DECADRON) 4 MG tablet TAKE FIVE TABLETS BY MOUTH EVERY WEEK START WITH THE FIRST DOSE OF CHEMOTHERAPY 80 tablet 0  . lisinopril (PRINIVIL,ZESTRIL) 10 MG tablet Take 5 mg by mouth every evening.     [name] oxyCODONE-acetaminophen (PERCOCET/ROXICET) 5-325 MG per tablet Take 1 tablet by mouth every 6 (six) hours as needed for severe pain. 30 tablet 0  . pantoprazole (PROTONIX)  40 MG tablet Take 1 tablet (40 mg total) by mouth 2 (two) times daily. 60 tablet 11  . pioglitazone (ACTOS) 15 MG tablet Take 15 mg by mouth daily.    . pomalidomide (POMALYST) 4 MG capsule Take 1 capsule (4 mg total) by mouth daily. Take with water on days 1-21. Repeat every 28 days.authorization number [id number] 09/11/14 21 capsule 0  . prochlorperazine (COMPAZINE) 10 MG tablet Take 10 mg by mouth every 6 (six) hours as needed for nausea or vomiting.    .  rivaroxaban (XARELTO) 20 MG TABS tablet Take 1 tablet (20 mg total) by mouth daily with supper. 30 tablet 1  . simvastatin (ZOCOR) 10 MG tablet Take 10 mg by mouth at bedtime.      . temazepam (RESTORIL) 15 MG capsule Take 15 mg by mouth at bedtime as needed for sleep.    . valACYclovir (VALTREX) 500 MG tablet TAKE ONE CAPLET BY MOUTH ONCE DAILY 30 tablet 3  . VIAGRA 50 MG tablet Take 50 mg by mouth daily as needed for erectile dysfunction.      No current facility-administered medications for this visit.    SURGICAL HISTORY:  Past Surgical History  Procedure Laterality Date  . Limbal stem cell transplant    . Humerus fracture surgery      right  . 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    REVIEW OF SYSTEMS:  Constitutional: positive for fatigue Eyes: negative Ears, nose, mouth, throat, and face: negative Respiratory: negative Cardiovascular: negative Gastrointestinal: negative Genitourinary:negative Integument/breast: negative Hematologic/lymphatic: negative Musculoskeletal:negative Neurological: positive for paresthesia and Affecting the toes and fleeting Behavioral/Psych: negative Endocrine: negative Allergic/Immunologic: negative   PHYSICAL EXAMINATION: General appearance: alert, cooperative and no distress Head: Normocephalic, without obvious abnormality, atraumatic Neck: no adenopathy, no JVD, supple, symmetrical, trachea midline and thyroid not enlarged, symmetric, no tenderness/mass/nodules Lymph nodes: Cervical, supraclavicular, and axillary nodes normal. Resp: clear to auscultation bilaterally Back: symmetric, no curvature. ROM normal. No CVA tenderness. Cardio: regular rate and rhythm, S1, S2 normal, no murmur, click, rub or gallop GI: soft, non-tender; bowel sounds normal; no masses,  no organomegaly Extremities: extremities normal, atraumatic, no cyanosis or edema Neurologic: Alert and oriented X 3, normal strength and tone. Normal symmetric  reflexes. Normal coordination and gait  ECOG PERFORMANCE STATUS: 1 - Symptomatic but completely ambulatory  Blood pressure 128/62, pulse 67, temperature 98.5 F (36.9 C), temperature source Oral, resp. rate 18, height 6' 1"  (1.854 m), weight 175 lb 12.8 oz (79.742 kg), SpO2 100 %.  LABORATORY DATA: Lab Results  Component Value Date   WBC 2.4* 09/26/2014   HGB 9.1* 09/26/2014   HCT 29.4* 09/26/2014   MCV 82.8 09/26/2014   PLT 204 09/26/2014      Chemistry      Component Value Date/Time   NA 140 09/26/2014 0904   NA 139 08/30/2013 0935   K 3.6 09/26/2014 0904   K 4.1 08/30/2013 0935   CL 101 08/30/2013 0935   CL 103 07/12/2012 0907   CO2 23 09/26/2014 0904   CO2 26 08/30/2013 0935   BUN 11.0 09/26/2014 0904   BUN 11 08/30/2013 0935   CREATININE 1.0 09/26/2014 0904   CREATININE 1.03 08/30/2013 0935      Component Value Date/Time   CALCIUM 8.8 09/26/2014 0904   CALCIUM 9.2 08/30/2013 0935   ALKPHOS 37* 09/26/2014 0904   ALKPHOS 39 08/30/2013 0935   AST 14 09/26/2014 0904   AST 13  08/30/2013 0935   ALT 14 09/26/2014 0904   ALT 9 08/30/2013 0935   BILITOT 2.35* 09/26/2014 0904   BILITOT 1.3* 08/30/2013 0935     Other lab results:   RADIOGRAPHIC STUDIES: No results found.  ASSESSMENT AND PLAN: This is a very pleasant 69 years old [place] American male with history of multiple myeloma currently undergoing systemic chemotherapy with Carfilzomib, cyclophosphamide and Decadron status post 19 cycles. This was discontinued secondary to disease progression. The patient has a started on systemic chemotherapy with Carfilzomib, Pomalyst and Decadron status post 6 cycles. He is tolerating his treatment fairly well except for mild pancytopenia Patient was discussed with and also seen by Dr. Julien Nordmann. His ANC is 1.1 today. We will postpone the start of cycle #7 by 1 week, including the Pomalyst. He will follow-up with [place] in 5 weeks prior to the start of cycle #8. We'll follow-up [facility]  regarding his multiple myeloma as previously scheduled. If they did not check his protein studies at that visit we will draw a set in the near future. He'll continue on Zometa every 2 months. Last given 08/28/2014. For the venous thrombosis, he will continue on Xarelto 20 mg by mouth daily. He will hold the Xarelto 3 days prior to his appointment with [facility] for injections to his bulging disc. He was advised to call immediately if he has any concerning symptoms in the interval.  The patient voices understanding of current disease status and treatment options and is in agreement with the current care plan. All questions were answered. The patient knows to call the clinic with any problems, questions or concerns. We can certainly see the patient much sooner if necessary.    Carlton Adam, PA-C 09/26/2014   ADDENDUM: Hematology/Oncology Attending: I had a face to face encounter with the patient. I recommended his care plan. This is a very pleasant 69 years old African-American male with relapsed multiple myeloma who is currently undergoing treatment with Carfilzomib, Pomalyst and Decadron status post 6 cycles. Has been tolerating his treatment fairly well with no significant adverse effects except for mild peripheral neuropathy. He also continues to have low back pain and expected to have steroid injection by his orthopedic surgeon for disc disease due to this week. His absolute neutrophil count is low today. I recommended for the patient to delay the start of cycle #7 by 1 week to give him more time to recover before resuming his systemic therapy. He is expected to see the stem cell transplant team at [facility] next week and the patient may have myeloma panel performed at that time. We will see him back for follow-up visit in one month's for reevaluation with the start of cycle #8. The patient will continue his current treatment with Zometa every 2 months. He will also  continue treatment with Xarelto for the history of deep venous thrombosis but this can be held for few days for the disc injection. The patient was advised to call immediately if he has any concerning symptoms in the interval.  Disclaimer: This note was dictated with voice recognition software. Similar sounding words can inadvertently be transcribed and may be missed upon review. Eilleen Kempf., MD 09/27/2014

## 2014-09-26 NOTE — Patient Instructions (Signed)
We are postponing your next cycle of chemotherapy by 1 week. Hold your Pomalyst for 1 week as well Keep your scheduled appointment at [facility] for follow-up Keep your appointment with [facility] orthopedics and remember to hold your Xarelto for at least 3 days prior to that appointment Follow-up approximate 5 weeks prior to the start of your next scheduled cycle of chemotherapy

## 2014-09-27 ENCOUNTER — Ambulatory Visit: Payer: Medicare Other

## 2014-10-02 ENCOUNTER — Other Ambulatory Visit: Payer: Self-pay | Admitting: Physician Assistant

## 2014-10-02 ENCOUNTER — Telehealth: Payer: Self-pay | Admitting: Internal Medicine

## 2014-10-02 ENCOUNTER — Ambulatory Visit (HOSPITAL_BASED_OUTPATIENT_CLINIC_OR_DEPARTMENT_OTHER): Payer: Self-pay

## 2014-10-02 ENCOUNTER — Other Ambulatory Visit (HOSPITAL_BASED_OUTPATIENT_CLINIC_OR_DEPARTMENT_OTHER): Payer: Medicare Other

## 2014-10-02 DIAGNOSIS — C9000 Multiple myeloma not having achieved remission: Secondary | ICD-10-CM

## 2014-10-02 DIAGNOSIS — C9002 Multiple myeloma in relapse: Secondary | ICD-10-CM

## 2014-10-02 LAB — CBC WITH DIFFERENTIAL/PLATELET
BASO%: 1.3 % (ref 0.0–2.0)
BASOS ABS: 0 10*3/uL (ref 0.0–0.1)
EOS%: 4.8 % (ref 0.0–7.0)
Eosinophils Absolute: 0.1 10*3/uL (ref 0.0–0.5)
HCT: 29.8 % — ABNORMAL LOW (ref 38.4–49.9)
HEMOGLOBIN: 9.2 g/dL — AB (ref 13.0–17.1)
LYMPH%: 35.8 % (ref 14.0–49.0)
MCH: 25.3 pg — AB (ref 27.2–33.4)
MCHC: 30.7 g/dL — ABNORMAL LOW (ref 32.0–36.0)
MCV: 82.5 fL (ref 79.3–98.0)
MONO#: 0.3 10*3/uL (ref 0.1–0.9)
MONO%: 13.1 % (ref 0.0–14.0)
NEUT%: 45 % (ref 39.0–75.0)
NEUTROS ABS: 1.1 10*3/uL — AB (ref 1.5–6.5)
Platelets: 162 10*3/uL (ref 140–400)
RBC: 3.62 10*6/uL — AB (ref 4.20–5.82)
RDW: 15.8 % — AB (ref 11.0–14.6)
WBC: 2.4 10*3/uL — AB (ref 4.0–10.3)
lymph#: 0.8 10*3/uL — ABNORMAL LOW (ref 0.9–3.3)

## 2014-10-02 LAB — COMPREHENSIVE METABOLIC PANEL (CC13)
ALBUMIN: 3.8 g/dL (ref 3.5–5.0)
ALK PHOS: 39 U/L — AB (ref 40–150)
ALT: 12 U/L (ref 0–55)
AST: 14 U/L (ref 5–34)
Anion Gap: 7 mEq/L (ref 3–11)
BUN: 10.3 mg/dL (ref 7.0–26.0)
CO2: 25 meq/L (ref 22–29)
Calcium: 8.8 mg/dL (ref 8.4–10.4)
Chloride: 110 mEq/L — ABNORMAL HIGH (ref 98–109)
Creatinine: 0.9 mg/dL (ref 0.7–1.3)
GLUCOSE: 102 mg/dL (ref 70–140)
POTASSIUM: 4.1 meq/L (ref 3.5–5.1)
SODIUM: 142 meq/L (ref 136–145)
Total Bilirubin: 2.41 mg/dL — ABNORMAL HIGH (ref 0.20–1.20)
Total Protein: 5.5 g/dL — ABNORMAL LOW (ref 6.4–8.3)

## 2014-10-02 NOTE — Telephone Encounter (Signed)
Gave adn printed appt sched and avs for pt for Sept and OCT °

## 2014-10-02 NOTE — Patient Instructions (Signed)
Neutropenia Neutropenia is a condition that occurs when the level of a certain type of white blood cell (neutrophil) in your body becomes lower than normal. Neutrophils are made in the bone marrow and fight infections. These cells protect against bacteria and viruses. The fewer neutrophils you have, and the longer your body remains without them, the greater your risk of getting a severe infection becomes. CAUSES  The cause of neutropenia may be hard to determine. However, it is usually due to 3 main problems:   Decreased production of neutrophils. This may be due to:  Certain medicines such as chemotherapy.  Genetic problems.  Cancer.  Radiation treatments.  Vitamin deficiency.  Some pesticides.  Increased destruction of neutrophils. This may be due to:  Overwhelming infections.  Hemolytic anemia. This is when the body destroys its own blood cells.  Chemotherapy.  Neutrophils moving to areas of the body where they cannot fight infections. This may be due to:  Dialysis procedures.  Conditions where the spleen becomes enlarged. Neutrophils are held in the spleen and are not available to the rest of the body.  Overwhelming infections. The neutrophils are held in the area of the infection and are not available to the rest of the body. SYMPTOMS  There are no specific symptoms of neutropenia. The lack of neutrophils can result in an infection, and an infection can cause various problems. DIAGNOSIS  Diagnosis is made by a blood test. A complete blood count is performed. The normal level of neutrophils in human blood differs with age and race. Infants have lower counts than older children and adults. African Americans have lower counts than Caucasians or Asians. The average adult level is 1500 cells/mm3 of blood. Neutrophil counts are interpreted as follows:  Greater than 1000 cells/mm3 gives normal protection against infection.  500 to 1000 cells/mm3 gives an increased risk for  infection.  200 to 500 cells/mm3 is a greater risk for severe infection.  Lower than 200 cells/mm3 is a marked risk of infection. This may require hospitalization and treatment with antibiotic medicines. TREATMENT  Treatment depends on the underlying cause, severity, and presence of infections or symptoms. It also depends on your health. Your caregiver will discuss the treatment plan with you. Mild cases are often easily treated and have a good outcome. Preventative measures may also be started to limit your risk of infections. Treatment can include:  Taking antibiotics.  Stopping medicines that are known to cause neutropenia.  Correcting nutritional deficiencies by eating green vegetables to supply folic acid and taking vitamin B supplements.  Stopping exposure to pesticides if your neutropenia is related to pesticide exposure.  Taking a blood growth factor called sargramostim, pegfilgrastim, or filgrastim if you are undergoing chemotherapy for cancer. This stimulates white blood cell production.  Removal of the spleen if you have Felty's syndrome and have repeated infections. HOME CARE INSTRUCTIONS   Follow your caregiver's instructions about when you need to have blood work done.  Wash your hands often. Make sure others who come in contact with you also wash their hands.  Wash raw fruits and vegetables before eating them. They can carry bacteria and fungi.  Avoid people with colds or spreadable (contagious) diseases (chickenpox, herpes zoster, influenza).  Avoid large crowds.  Avoid construction areas. The dust can release fungus into the air.  Be cautious around children in daycare or school environments.  Take care of your respiratory system by coughing and deep breathing.  Bathe daily.  Protect your skin from cuts and   burns.  Do not work in the garden or with flowers and plants.  Care for the mouth before and after meals by brushing with a soft toothbrush. If you have  mucositis, do not use mouthwash. Mouthwash contains alcohol and can dry out the mouth even more.  Clean the area between the genitals and the anus (perineal area) after urination and bowel movements. Women need to wipe from front to back.  Use a water soluble lubricant during sexual intercourse and practice good hygiene after. Do not have intercourse if you are severely neutropenic. Check with your caregiver for guidelines.  Exercise daily as tolerated.  Avoid people who were vaccinated with a live vaccine in the past 30 days. You should not receive live vaccines (polio, typhoid).  Do not provide direct care for pets. Avoid animal droppings. Do not clean litter boxes and bird cages.  Do not share food utensils.  Do not use tampons, enemas, or rectal suppositories unless directed by your caregiver.  Use an electric razor to remove hair.  Wash your hands after handling magazines, letters, and newspapers. SEEK IMMEDIATE MEDICAL CARE IF:   You have a fever.  You have chills or start to shake.  You feel nauseous or vomit.  You develop mouth sores.  You develop aches and pains.  You have redness and swelling around open wounds.  Your skin is warm to the touch.  You have pus coming from your wounds.  You develop swollen lymph nodes.  You feel weak or fatigued.  You develop red streaks on the skin. MAKE SURE YOU:  Understand these instructions.  Will watch your condition.  Will get help right away if you are not doing well or get worse. Document Released: 06/28/2001 Document Revised: 03/31/2011 Document Reviewed: 07/26/2010 ExitCare Patient Information 2015 [employer]. This information is not intended to replace advice given to you by your health care provider. Make sure you discuss any questions you have with your health care provider.  

## 2014-10-02 NOTE — Progress Notes (Signed)
Today's labs reviewed with Awilda Metro FNP. Tx today postponed until next week. Patient sent to scheduling per A. Johnson's request.

## 2014-10-03 ENCOUNTER — Ambulatory Visit: Payer: Medicare Other

## 2014-10-05 ENCOUNTER — Other Ambulatory Visit: Payer: Self-pay | Admitting: Internal Medicine

## 2014-10-05 ENCOUNTER — Other Ambulatory Visit: Payer: Self-pay | Admitting: Medical Oncology

## 2014-10-05 DIAGNOSIS — C9000 Multiple myeloma not having achieved remission: Secondary | ICD-10-CM

## 2014-10-05 MED ORDER — POMALIDOMIDE 4 MG PO CAPS: 4.0000 mg | ORAL_CAPSULE | Freq: Every day | ORAL | Status: DC

## 2014-10-09 ENCOUNTER — Other Ambulatory Visit: Payer: Self-pay | Admitting: Internal Medicine

## 2014-10-09 ENCOUNTER — Ambulatory Visit (HOSPITAL_BASED_OUTPATIENT_CLINIC_OR_DEPARTMENT_OTHER): Payer: Medicare Other

## 2014-10-09 ENCOUNTER — Other Ambulatory Visit: Payer: Self-pay | Admitting: Medical Oncology

## 2014-10-09 ENCOUNTER — Other Ambulatory Visit (HOSPITAL_BASED_OUTPATIENT_CLINIC_OR_DEPARTMENT_OTHER): Payer: Medicare Other

## 2014-10-09 VITALS — BP 135/68 | Temp 97.5°F | Resp 57

## 2014-10-09 DIAGNOSIS — Z5112 Encounter for antineoplastic immunotherapy: Secondary | ICD-10-CM | POA: Diagnosis present

## 2014-10-09 DIAGNOSIS — C9000 Multiple myeloma not having achieved remission: Secondary | ICD-10-CM

## 2014-10-09 DIAGNOSIS — C9002 Multiple myeloma in relapse: Secondary | ICD-10-CM

## 2014-10-09 LAB — COMPREHENSIVE METABOLIC PANEL (CC13)
ALT: 13 U/L (ref 0–55)
AST: 12 U/L (ref 5–34)
Albumin: 3.7 g/dL (ref 3.5–5.0)
Alkaline Phosphatase: 38 U/L — ABNORMAL LOW (ref 40–150)
Anion Gap: 6 mEq/L (ref 3–11)
BUN: 11.7 mg/dL (ref 7.0–26.0)
CHLORIDE: 107 meq/L (ref 98–109)
CO2: 27 meq/L (ref 22–29)
CREATININE: 1 mg/dL (ref 0.7–1.3)
Calcium: 8.7 mg/dL (ref 8.4–10.4)
EGFR: 90 mL/min/{1.73_m2} — ABNORMAL LOW (ref >90)
Glucose: 124 mg/dl (ref 70–140)
POTASSIUM: 3.9 meq/L (ref 3.5–5.1)
SODIUM: 140 meq/L (ref 136–145)
Total Bilirubin: 1.82 mg/dL — ABNORMAL HIGH (ref 0.20–1.20)
Total Protein: 5.5 g/dL — ABNORMAL LOW (ref 6.4–8.3)

## 2014-10-09 LAB — CBC WITH DIFFERENTIAL/PLATELET
BASO%: 1.3 % (ref 0.0–2.0)
Basophils Absolute: 0.1 10*3/uL (ref 0.0–0.1)
EOS%: 6.1 % (ref 0.0–7.0)
Eosinophils Absolute: 0.2 10*3/uL (ref 0.0–0.5)
HEMATOCRIT: 30.6 % — AB (ref 38.4–49.9)
HEMOGLOBIN: 9.5 g/dL — AB (ref 13.0–17.1)
LYMPH%: 26.4 % (ref 14.0–49.0)
MCH: 25.2 pg — ABNORMAL LOW (ref 27.2–33.4)
MCHC: 30.9 g/dL — AB (ref 32.0–36.0)
MCV: 81.6 fL (ref 79.3–98.0)
MONO#: 0.8 10*3/uL (ref 0.1–0.9)
MONO%: 19.8 % — ABNORMAL HIGH (ref 0.0–14.0)
NEUT#: 1.8 10*3/uL (ref 1.5–6.5)
NEUT%: 46.4 % (ref 39.0–75.0)
Platelets: 178 10*3/uL (ref 140–400)
RBC: 3.76 10*6/uL — ABNORMAL LOW (ref 4.20–5.82)
RDW: 15.3 % — ABNORMAL HIGH (ref 11.0–14.6)
WBC: 3.9 10*3/uL — ABNORMAL LOW (ref 4.0–10.3)
lymph#: 1 10*3/uL (ref 0.9–3.3)

## 2014-10-09 MED ORDER — POMALIDOMIDE 4 MG PO CAPS: 4.0000 mg | ORAL_CAPSULE | Freq: Every day | ORAL | Status: DC

## 2014-10-09 MED ORDER — SODIUM CHLORIDE 0.9 % IJ SOLN
10.0000 mL | INTRAMUSCULAR | Status: DC | PRN
  Administered 2014-10-09: 10 mL
  Filled 2014-10-09: qty 10

## 2014-10-09 MED ORDER — SODIUM CHLORIDE 0.9 % IV SOLN
Freq: Once | INTRAVENOUS | Status: AC
  Administered 2014-10-09: 10:00:00 via INTRAVENOUS
  Filled 2014-10-09: qty 4

## 2014-10-09 MED ORDER — SODIUM CHLORIDE 0.9 % IV SOLN: Freq: Once | INTRAVENOUS | Status: DC

## 2014-10-09 MED ORDER — DEXTROSE 5 % IV SOLN
36.0000 mg/m2 | Freq: Once | INTRAVENOUS | Status: AC
  Administered 2014-10-09: 72 mg via INTRAVENOUS
  Filled 2014-10-09: qty 36

## 2014-10-09 MED ORDER — SODIUM CHLORIDE 0.9 % IV SOLN
Freq: Once | INTRAVENOUS | Status: AC
  Administered 2014-10-09: 10:00:00 via INTRAVENOUS

## 2014-10-09 MED ORDER — HEPARIN SOD (PORK) LOCK FLUSH 100 UNIT/ML IV SOLN
500.0000 [IU] | Freq: Once | INTRAVENOUS | Status: AC | PRN
  Administered 2014-10-09: 500 [IU]
  Filled 2014-10-09: qty 5

## 2014-10-09 NOTE — Patient Instructions (Signed)
[facility] Discharge Instructions for Patients Receiving Chemotherapy  Today you received the following chemotherapy agents: Kyprolis   To help prevent nausea and vomiting after your treatment, we encourage you to take your nausea medication as directed.    If you develop nausea and vomiting that is not controlled by your nausea medication, call the clinic.   BELOW ARE SYMPTOMS THAT SHOULD BE REPORTED IMMEDIATELY:  *FEVER GREATER THAN 100.5 F  *CHILLS WITH OR WITHOUT FEVER  NAUSEA AND VOMITING THAT IS NOT CONTROLLED WITH YOUR NAUSEA MEDICATION  *UNUSUAL SHORTNESS OF BREATH  *UNUSUAL BRUISING OR BLEEDING  TENDERNESS IN MOUTH AND THROAT WITH OR WITHOUT PRESENCE OF ULCERS  *URINARY PROBLEMS  *BOWEL PROBLEMS  UNUSUAL RASH Items with * indicate a potential emergency and should be followed up as soon as possible.  Feel free to call the clinic you have any questions or concerns. The clinic phone number is [phone].  Please show the CHEMO ALERT CARD at check-in to the Emergency Department and triage nurse.   

## 2014-10-09 NOTE — Progress Notes (Signed)
pomalyst rx faced to [facility]

## 2014-10-09 NOTE — Progress Notes (Signed)
Pt declines additional hydration fluids today.

## 2014-10-10 ENCOUNTER — Ambulatory Visit (HOSPITAL_BASED_OUTPATIENT_CLINIC_OR_DEPARTMENT_OTHER): Payer: Medicare Other

## 2014-10-10 VITALS — BP 124/65 | HR 62 | Temp 98.0°F | Resp 18

## 2014-10-10 DIAGNOSIS — C9002 Multiple myeloma in relapse: Secondary | ICD-10-CM | POA: Diagnosis not present

## 2014-10-10 DIAGNOSIS — Z5112 Encounter for antineoplastic immunotherapy: Secondary | ICD-10-CM | POA: Diagnosis present

## 2014-10-10 DIAGNOSIS — C9000 Multiple myeloma not having achieved remission: Secondary | ICD-10-CM

## 2014-10-10 MED ORDER — SODIUM CHLORIDE 0.9 % IJ SOLN
10.0000 mL | INTRAMUSCULAR | Status: DC | PRN
Start: 2014-10-10 — End: 2014-10-10
  Administered 2014-10-10: 10 mL
  Filled 2014-10-10: qty 10

## 2014-10-10 MED ORDER — HEPARIN SOD (PORK) LOCK FLUSH 100 UNIT/ML IV SOLN
500.0000 [IU] | Freq: Once | INTRAVENOUS | Status: AC | PRN
  Administered 2014-10-10: 500 [IU]
  Filled 2014-10-10: qty 5

## 2014-10-10 MED ORDER — SODIUM CHLORIDE 0.9 % IV SOLN
Freq: Once | INTRAVENOUS | Status: AC
  Administered 2014-10-10: 10:00:00 via INTRAVENOUS
  Filled 2014-10-10: qty 4

## 2014-10-10 MED ORDER — SODIUM CHLORIDE 0.9 % IV SOLN: Freq: Once | INTRAVENOUS | Status: DC

## 2014-10-10 MED ORDER — DEXTROSE 5 % IV SOLN
36.0000 mg/m2 | Freq: Once | INTRAVENOUS | Status: AC
  Administered 2014-10-10: 72 mg via INTRAVENOUS
  Filled 2014-10-10: qty 36

## 2014-10-10 MED ORDER — SODIUM CHLORIDE 0.9 % IV SOLN
Freq: Once | INTRAVENOUS | Status: AC
  Administered 2014-10-10: 09:00:00 via INTRAVENOUS

## 2014-10-10 NOTE — Patient Instructions (Signed)
[facility] Discharge Instructions for Patients Receiving Chemotherapy  Today you received the following chemotherapy agents: Kyprolis   To help prevent nausea and vomiting after your treatment, we encourage you to take your nausea medication as directed.    If you develop nausea and vomiting that is not controlled by your nausea medication, call the clinic.   BELOW ARE SYMPTOMS THAT SHOULD BE REPORTED IMMEDIATELY:  *FEVER GREATER THAN 100.5 F  *CHILLS WITH OR WITHOUT FEVER  NAUSEA AND VOMITING THAT IS NOT CONTROLLED WITH YOUR NAUSEA MEDICATION  *UNUSUAL SHORTNESS OF BREATH  *UNUSUAL BRUISING OR BLEEDING  TENDERNESS IN MOUTH AND THROAT WITH OR WITHOUT PRESENCE OF ULCERS  *URINARY PROBLEMS  *BOWEL PROBLEMS  UNUSUAL RASH Items with * indicate a potential emergency and should be followed up as soon as possible.  Feel free to call the clinic you have any questions or concerns. The clinic phone number is [phone].  Please show the CHEMO ALERT CARD at check-in to the Emergency Department and triage nurse.   

## 2014-10-10 NOTE — Progress Notes (Signed)
Pt refuses additional hydration fluids today.  After Kyprolis infusion prior to d/c, pt mentioned having a productive cough x 2 weeks.  Pt reports secretions are clear and have no color or thickness to them.  Pt denies any fever or chills or other signs of infection.  Selena Lesser, NP notified and gives instructions for pt to try something OTC for allergies.  Her recommendations include Zyrtec and Mucinex.  This information given to pt and he verbalizes understanding as well as to notify us if anything changes or worsens.

## 2014-10-16 ENCOUNTER — Ambulatory Visit (HOSPITAL_BASED_OUTPATIENT_CLINIC_OR_DEPARTMENT_OTHER): Payer: Medicare Other | Admitting: Physician Assistant

## 2014-10-16 ENCOUNTER — Other Ambulatory Visit (HOSPITAL_BASED_OUTPATIENT_CLINIC_OR_DEPARTMENT_OTHER): Payer: Medicare Other

## 2014-10-16 ENCOUNTER — Ambulatory Visit (HOSPITAL_BASED_OUTPATIENT_CLINIC_OR_DEPARTMENT_OTHER): Payer: Medicare Other

## 2014-10-16 ENCOUNTER — Telehealth: Payer: Self-pay | Admitting: Internal Medicine

## 2014-10-16 ENCOUNTER — Encounter: Payer: Self-pay | Admitting: Physician Assistant

## 2014-10-16 VITALS — BP 153/75 | HR 60 | Temp 98.6°F | Resp 18 | Ht 73.0 in | Wt 176.2 lb

## 2014-10-16 DIAGNOSIS — C9000 Multiple myeloma not having achieved remission: Secondary | ICD-10-CM

## 2014-10-16 DIAGNOSIS — Z5112 Encounter for antineoplastic immunotherapy: Secondary | ICD-10-CM

## 2014-10-16 DIAGNOSIS — I82409 Acute embolism and thrombosis of unspecified deep veins of unspecified lower extremity: Secondary | ICD-10-CM | POA: Diagnosis not present

## 2014-10-16 DIAGNOSIS — C9002 Multiple myeloma in relapse: Secondary | ICD-10-CM | POA: Diagnosis present

## 2014-10-16 LAB — COMPREHENSIVE METABOLIC PANEL (CC13)
ALBUMIN: 3.5 g/dL (ref 3.5–5.0)
ALK PHOS: 41 U/L (ref 40–150)
ALT: 11 U/L (ref 0–55)
AST: 10 U/L (ref 5–34)
Anion Gap: 7 mEq/L (ref 3–11)
BUN: 12.2 mg/dL (ref 7.0–26.0)
CO2: 25 mEq/L (ref 22–29)
Calcium: 9 mg/dL (ref 8.4–10.4)
Chloride: 106 mEq/L (ref 98–109)
Creatinine: 1 mg/dL (ref 0.7–1.3)
EGFR: >90 mL/min/{1.73_m2} (ref >90)
GLUCOSE: 129 mg/dL (ref 70–140)
POTASSIUM: 4.1 meq/L (ref 3.5–5.1)
SODIUM: 138 meq/L (ref 136–145)
Total Bilirubin: 1.95 mg/dL — ABNORMAL HIGH (ref 0.20–1.20)
Total Protein: 5.4 g/dL — ABNORMAL LOW (ref 6.4–8.3)

## 2014-10-16 LAB — CBC WITH DIFFERENTIAL/PLATELET
BASO%: 0 % (ref 0.0–2.0)
Basophils Absolute: 0 10*3/uL (ref 0.0–0.1)
EOS%: 13 % — AB (ref 0.0–7.0)
Eosinophils Absolute: 0.5 10*3/uL (ref 0.0–0.5)
HCT: 29.8 % — ABNORMAL LOW (ref 38.4–49.9)
HEMOGLOBIN: 9.3 g/dL — AB (ref 13.0–17.1)
LYMPH#: 0.9 10*3/uL (ref 0.9–3.3)
LYMPH%: 23.8 % (ref 14.0–49.0)
MCH: 25 pg — AB (ref 27.2–33.4)
MCHC: 31.2 g/dL — ABNORMAL LOW (ref 32.0–36.0)
MCV: 80.1 fL (ref 79.3–98.0)
MONO#: 0.4 10*3/uL (ref 0.1–0.9)
MONO%: 9 % (ref 0.0–14.0)
NEUT#: 2.1 10*3/uL (ref 1.5–6.5)
NEUT%: 54.2 % (ref 39.0–75.0)
NRBC: 0 % (ref 0–0)
Platelets: 68 10*3/uL — ABNORMAL LOW (ref 140–400)
RBC: 3.72 10*6/uL — ABNORMAL LOW (ref 4.20–5.82)
RDW: 14.8 % — AB (ref 11.0–14.6)
WBC: 3.9 10*3/uL — AB (ref 4.0–10.3)

## 2014-10-16 MED ORDER — CARFILZOMIB CHEMO INJECTION 60 MG
36.0000 mg/m2 | Freq: Once | INTRAVENOUS | Status: AC
  Administered 2014-10-16: 72 mg via INTRAVENOUS
  Filled 2014-10-16: qty 36

## 2014-10-16 MED ORDER — LIDOCAINE-PRILOCAINE 2.5-2.5 % EX CREA: 1.0000 "application " | TOPICAL_CREAM | CUTANEOUS | Status: DC | PRN

## 2014-10-16 MED ORDER — SODIUM CHLORIDE 0.9 % IV SOLN
Freq: Once | INTRAVENOUS | Status: AC
  Administered 2014-10-16: 10:00:00 via INTRAVENOUS

## 2014-10-16 MED ORDER — DEXAMETHASONE 4 MG PO TABS: ORAL_TABLET | ORAL | Status: DC

## 2014-10-16 MED ORDER — SODIUM CHLORIDE 0.9 % IJ SOLN
10.0000 mL | INTRAMUSCULAR | Status: DC | PRN
  Administered 2014-10-16: 10 mL
  Filled 2014-10-16: qty 10

## 2014-10-16 MED ORDER — SODIUM CHLORIDE 0.9 % IV SOLN
Freq: Once | INTRAVENOUS | Status: AC
  Administered 2014-10-16: 10:00:00 via INTRAVENOUS
  Filled 2014-10-16: qty 4

## 2014-10-16 MED ORDER — HEPARIN SOD (PORK) LOCK FLUSH 100 UNIT/ML IV SOLN
500.0000 [IU] | Freq: Once | INTRAVENOUS | Status: AC | PRN
  Administered 2014-10-16: 500 [IU]
  Filled 2014-10-16: qty 5

## 2014-10-16 NOTE — Progress Notes (Addendum)
[place] [phone]   [phone]  OFFICE PROGRESS NOTE  Eddie Thomas [address] 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  2) the venous thrombosis diagnosed in June 2016  PRIOR THERAPY: :  1. Status post 6 cycles of systemic chemotherapy with Revlimid and Decadron, last dose was given 07/21/2010 with very good response. 2. 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 Ok Thomas.  3. maintenance Revlimid at 10 mg by mouth daily status post 2 months. Therapy began 01/18/2011. 4. maintenance Revlimid at 15 mg by mouth daily with prophylactic dose Coumadin at 2 mg by mouth daily. 5. Systemic chemotherapy with Velcade at 1.3 mg per meter squared given on days 1, 4, 8 and 11 and Doxil at 30 mg per meter square given on day 4 and Decadron 40 mg by mouth on weekly basis given every 3 weeks. Status post 4 cycles. 6. Zometa 4 mg IV every 4 weeks.  7. Velcade 1.3 mg/M2 subcutaneous daily on a weekly basis with Decadron 20 mg by mouth on a weekly basis. First cycle expected on 06/21/2012. S/P 4 cycles. 8. Systemic chemotherapy with Carfilzomib, cyclophosphamide and Decadron. First cycle started on 08/09/2012. He is status post 19 cycles.   CURRENT THERAPY:  1) Systemic chemotherapy with Carfilzomib 36 MG/M2 on days 1, 2, 8, 9, 15 and 16 every 4 weeks, Pomalyst 4 mg by mouth daily for 21 days every 4 weeks and Decadron 40 mg by mouth weekly. First cycle started on 04/10/2014. Status post 6 cycles. 2) Zometa every 8 weeks.  3) Xarelto 20 mg by mouth daily, started 07/17/2014.  INTERVAL HISTORY: Eddie Thomas 69 y.o. male returns to the clinic today for followup visit. Today he is due for day 8 cycle #7 of Carfilzomib, Pomalyst and dexamethasone. He tolerates treatment fairly well except for mild pancytopenia. He  denied having any bleeding issues, and continues on xarelto daily. He was recently seen in [facility] for a follow up visit. His protein studies were repeated and were improved/stable. He continues to have some back pain despite recent injections by [employer].He continues to report occasional tingling in his toes but nothing consistent. He denies fevers, chills, nausea, vomiting, or changes in bowel or bladder habits.  He denied having any significant weight loss or night sweats. He has no significant chest pain, shortness of breath, cough or hemoptysis.   MEDICAL HISTORY: Past Medical History  Diagnosis Date  . Multiple myeloma   . Diabetes mellitus 07/03/2011  . Hypertension 07/03/2011  . Hyperlipidemia 07/03/2011  . Colon polyps 2012  . Gastric ulcer     ALLERGIES:  has No Known Allergies.  MEDICATIONS:  Current Outpatient Prescriptions  Medication Sig Dispense Refill  . Cholecalciferol (VITAMIN D-3) 5000 UNITS TABS Take 1 tablet by mouth daily.    [name] dexamethasone (DECADRON) 4 MG tablet TAKE FIVE TABLETS BY MOUTH EVERY WEEK START WITH THE FIRST DOSE OF CHEMOTHERAPY 80 tablet 0  . lisinopril (PRINIVIL,ZESTRIL) 10 MG tablet Take 5 mg by mouth every evening.     [name] oxyCODONE-acetaminophen (PERCOCET/ROXICET) 5-325 MG per tablet Take 1 tablet by mouth every 6 (six) hours as needed for severe pain. 30 tablet 0  . pantoprazole (PROTONIX) 40 MG tablet Take 1 tablet (40 mg total) by mouth 2 (two) times daily. 60 tablet 11  .  pioglitazone (ACTOS) 15 MG tablet Take 15 mg by mouth daily.    . pomalidomide (POMALYST) 4 MG capsule Take 1 capsule (4 mg total) by mouth daily. Take with water on days 1-21. Repeat every 28 days.authorization number [id number]  10/05/14-adult male 21 capsule 0  . prochlorperazine (COMPAZINE) 10 MG tablet Take 10 mg by mouth every 6 (six) hours as needed for nausea or vomiting.    . rivaroxaban (XARELTO) 20 MG TABS tablet Take 1 tablet (20 mg total) by mouth daily  with supper. 30 tablet 1  . simvastatin (ZOCOR) 10 MG tablet Take 10 mg by mouth at bedtime.      . temazepam (RESTORIL) 15 MG capsule Take 15 mg by mouth at bedtime as needed for sleep.    . valACYclovir (VALTREX) 500 MG tablet TAKE ONE CAPLET BY MOUTH ONCE DAILY 30 tablet 3  . VIAGRA 50 MG tablet Take 50 mg by mouth daily as needed for erectile dysfunction.      No current facility-administered medications for this visit.   Facility-Administered Medications Ordered in Other Visits  Medication Dose Route Frequency Provider Last Rate Last Dose  . sodium chloride 0.9 % injection 10 mL  10 mL Intracatheter PRN Curt Bears, MD   10 mL at 10/16/14 1149    SURGICAL HISTORY:  Past Surgical History  Procedure Laterality Date  . Limbal stem cell transplant    . Humerus fracture surgery      right  . 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    REVIEW OF SYSTEMS:  Constitutional: positive for fatigue Eyes: negative Ears, nose, mouth, throat, and face: negative Respiratory: negative Cardiovascular: negative Gastrointestinal: negative Genitourinary:negative Integument/breast: negative Hematologic/lymphatic: negative Musculoskeletal:negative Neurological: positive for paresthesia and Involving the toes and fleeting Behavioral/Psych: negative Endocrine: negative Allergic/Immunologic: negative   PHYSICAL EXAMINATION: General appearance: alert, cooperative and no distress Head: Normocephalic, without obvious abnormality, atraumatic Neck: no adenopathy, no JVD, supple, symmetrical, trachea midline and thyroid not enlarged, symmetric, no tenderness/mass/nodules Lymph nodes: Cervical, supraclavicular, and axillary nodes normal. Resp: clear to auscultation bilaterally Back: symmetric, no curvature. ROM normal. No CVA tenderness. Cardio: regular rate and rhythm, S1, S2 normal, no murmur, click, rub or gallop GI: soft, non-tender; bowel sounds normal; no masses,  no  organomegaly Extremities: extremities normal, atraumatic, no cyanosis or edema Neurologic: Alert and oriented X 3, normal strength and tone. Normal symmetric reflexes. Normal coordination and gait  ECOG PERFORMANCE STATUS: 1 - Symptomatic but completely ambulatory  Blood pressure 153/75, pulse 60, temperature 98.6 F (37 C), temperature source Oral, resp. rate 18, height 6' 1"  (1.854 m), weight 176 lb 3.2 oz (79.924 kg), SpO2 100 %.  LABORATORY DATA: Lab Results  Component Value Date   WBC 3.9* 10/16/2014   HGB 9.3* 10/16/2014   HCT 29.8* 10/16/2014   MCV 80.1 10/16/2014   PLT 68* 10/16/2014      Chemistry      Component Value Date/Time   NA 138 10/16/2014 0904   NA 139 08/30/2013 0935   K 4.1 10/16/2014 0904   K 4.1 08/30/2013 0935   CL 101 08/30/2013 0935   CL 103 07/12/2012 0907   CO2 25 10/16/2014 0904   CO2 26 08/30/2013 0935   BUN 12.2 10/16/2014 0904   BUN 11 08/30/2013 0935   CREATININE 1.0 10/16/2014 0904   CREATININE 1.03 08/30/2013 0935      Component Value Date/Time   CALCIUM 9.0 10/16/2014 0904   CALCIUM 9.2  08/30/2013 0935   ALKPHOS 41 10/16/2014 0904   ALKPHOS 39 08/30/2013 0935   AST 10 10/16/2014 0904   AST 13 08/30/2013 0935   ALT 11 10/16/2014 0904   ALT 9 08/30/2013 0935   BILITOT 1.95* 10/16/2014 0904   BILITOT 1.3* 08/30/2013 0935     Other lab results:   RADIOGRAPHIC STUDIES: No results found.  ASSESSMENT AND PLAN: This is a very pleasant 69 years old [place] American male with history of multiple myeloma currently undergoing systemic chemotherapy with Carfilzomib, cyclophosphamide and Decadron status post 19 cycles. This was discontinued secondary to disease progression. The patient has a started on systemic chemotherapy with Carfilzomib, Pomalyst and Decadron status post 6 cycles. He is tolerating his treatment fairly well except for mild pancytopenia Patient was discussed with and also seen by Dr. Julien Nordmann. His ANC is 2.1 but his  platelets are 68,000 today. He is not having any bleeding or bruising. We will proceed with day 8 of cycle #7 today as scheduled. Likely he will not receive day 15 of this particular cycle. He isn't notify us immediately if he develops any bleeding or significant bruising. His recent protein studies is checked [facility] revealed improved to stable levels. We'll plan to continue his current treatment. He will follow-up prior to the start of cycle #8.  He'll continue on Zometa every 2 months. Last given 08/28/2014. For the venous thrombosis, he will continue on Xarelto 20 mg by mouth daily. He was advised to call immediately if he has any concerning symptoms in the interval.  Refills for Emla cream and dexamethasone were sent to his pharmacy of record via [place]. The patient voices understanding of current disease status and treatment options and is in agreement with the current care plan. All questions were answered. The patient knows to call the clinic with any problems, questions or concerns. We can certainly see the patient much sooner if necessary.    Carlton Adam, PA-C 10/16/2014   ADDENDUM: Hematology/Oncology Attending: I had a face to face encounter with the patient today. I recommended his care plan. This is a very pleasant 69 years old African American Male with relapsed Multiple Myeloma, currently undergoing chemotherapy with Carfilzomib, Pomalyst and Decadron. He is currently undergoing cycle #7. His protein study performed at [facility] showed no evidence for disease progression. I recommended for the patient to continue with cycle#7 as scheduled. He will come back for follow up visit in 3 weeks with the start of cycle #8.  The patient was advised to call immediately for any concerning symptoms in the interval.  Disclaimer: This note was dictated with voice recognition software. Similar sounding words can inadvertently be transcribed and may not be corrected upon review. Eilleen Kempf., MD 10/16/2014

## 2014-10-16 NOTE — Telephone Encounter (Signed)
Appointments complete per 9/26 pof. additional cycles added due to provider availability. patient to get schedule in inf area.

## 2014-10-16 NOTE — Progress Notes (Signed)
Ok to treat with carfilzomib with plts of 68,000 per Awilda Metro NP.

## 2014-10-16 NOTE — Patient Instructions (Signed)
Continue labs and chemotherapy as scheduled Follow-up the start of index scheduled cycle of chemotherapy

## 2014-10-17 ENCOUNTER — Telehealth: Payer: Self-pay | Admitting: *Deleted

## 2014-10-17 ENCOUNTER — Ambulatory Visit (HOSPITAL_BASED_OUTPATIENT_CLINIC_OR_DEPARTMENT_OTHER): Payer: Medicare Other

## 2014-10-17 VITALS — BP 135/69 | HR 58 | Temp 97.7°F | Resp 18

## 2014-10-17 DIAGNOSIS — Z5112 Encounter for antineoplastic immunotherapy: Secondary | ICD-10-CM | POA: Diagnosis present

## 2014-10-17 DIAGNOSIS — C9000 Multiple myeloma not having achieved remission: Secondary | ICD-10-CM

## 2014-10-17 DIAGNOSIS — C9002 Multiple myeloma in relapse: Secondary | ICD-10-CM | POA: Diagnosis not present

## 2014-10-17 MED ORDER — SODIUM CHLORIDE 0.9 % IV SOLN
Freq: Once | INTRAVENOUS | Status: AC
  Administered 2014-10-17: 09:00:00 via INTRAVENOUS

## 2014-10-17 MED ORDER — LIDOCAINE-PRILOCAINE 2.5-2.5 % EX CREA: 1.0000 "application " | TOPICAL_CREAM | CUTANEOUS | Status: DC | PRN

## 2014-10-17 MED ORDER — SODIUM CHLORIDE 0.9 % IJ SOLN
10.0000 mL | INTRAMUSCULAR | Status: DC | PRN
  Administered 2014-10-17: 10 mL
  Filled 2014-10-17: qty 10

## 2014-10-17 MED ORDER — DEXAMETHASONE 4 MG PO TABS: ORAL_TABLET | ORAL | Status: DC

## 2014-10-17 MED ORDER — RIVAROXABAN 20 MG PO TABS: 20.0000 mg | ORAL_TABLET | Freq: Every day | ORAL | Status: DC

## 2014-10-17 MED ORDER — DEXTROSE 5 % IV SOLN
36.0000 mg/m2 | Freq: Once | INTRAVENOUS | Status: AC
  Administered 2014-10-17: 72 mg via INTRAVENOUS
  Filled 2014-10-17: qty 36

## 2014-10-17 MED ORDER — SODIUM CHLORIDE 0.9 % IV SOLN
Freq: Once | INTRAVENOUS | Status: AC
  Administered 2014-10-17: 09:00:00 via INTRAVENOUS
  Filled 2014-10-17: qty 4

## 2014-10-17 MED ORDER — HEPARIN SOD (PORK) LOCK FLUSH 100 UNIT/ML IV SOLN
500.0000 [IU] | Freq: Once | INTRAVENOUS | Status: AC | PRN
  Administered 2014-10-17: 500 [IU]
  Filled 2014-10-17: qty 5

## 2014-10-17 NOTE — Telephone Encounter (Signed)
Patient called reporting "[facility] on [address] has no record of the Emla Cream, Decadron, Xarelto refills Awilda Metro was to send in yesterday.  Observed [facility] listed as recipient of these orders.  Boomer Winders re-send at this time.

## 2014-10-17 NOTE — Patient Instructions (Signed)
[facility] Discharge Instructions for Patients Receiving Chemotherapy  Today you received the following chemotherapy agents kyprolis  To help prevent nausea and vomiting after your treatment, we encourage you to take your nausea medication as directed   If you develop nausea and vomiting that is not controlled by your nausea medication, call the clinic.   BELOW ARE SYMPTOMS THAT SHOULD BE REPORTED IMMEDIATELY:  *FEVER GREATER THAN 100.5 F  *CHILLS WITH OR WITHOUT FEVER  NAUSEA AND VOMITING THAT IS NOT CONTROLLED WITH YOUR NAUSEA MEDICATION  *UNUSUAL SHORTNESS OF BREATH  *UNUSUAL BRUISING OR BLEEDING  TENDERNESS IN MOUTH AND THROAT WITH OR WITHOUT PRESENCE OF ULCERS  *URINARY PROBLEMS  *BOWEL PROBLEMS  UNUSUAL RASH Items with * indicate a potential emergency and should be followed up as soon as possible.  Feel free to call the clinic you have any questions or concerns. The clinic phone number is [phone].  

## 2014-10-18 ENCOUNTER — Telehealth: Payer: Self-pay

## 2014-10-18 NOTE — Telephone Encounter (Signed)
Pt called about bruise on his right knee.  He is concerned because his platelets were low when he received treatment 10/16/14.  Her was told to look for signs of bleeding but was unsure what to look for.   Advised patient signs of abnormal bleeding: black tarry stools, coffee grounds emesis, nose bleeds, any bleeding you cannot stop.  Pt voiced understanding.

## 2014-10-23 ENCOUNTER — Ambulatory Visit (HOSPITAL_BASED_OUTPATIENT_CLINIC_OR_DEPARTMENT_OTHER): Payer: Medicare Other

## 2014-10-23 ENCOUNTER — Other Ambulatory Visit (HOSPITAL_BASED_OUTPATIENT_CLINIC_OR_DEPARTMENT_OTHER): Payer: Medicare Other

## 2014-10-23 VITALS — BP 151/72 | HR 57 | Temp 98.6°F | Resp 18

## 2014-10-23 DIAGNOSIS — C9000 Multiple myeloma not having achieved remission: Secondary | ICD-10-CM

## 2014-10-23 DIAGNOSIS — C9002 Multiple myeloma in relapse: Secondary | ICD-10-CM | POA: Diagnosis present

## 2014-10-23 DIAGNOSIS — Z5112 Encounter for antineoplastic immunotherapy: Secondary | ICD-10-CM

## 2014-10-23 DIAGNOSIS — C9001 Multiple myeloma in remission: Secondary | ICD-10-CM

## 2014-10-23 LAB — CBC WITH DIFFERENTIAL/PLATELET
BASO%: 0.5 % (ref 0.0–2.0)
Basophils Absolute: 0 10*3/uL (ref 0.0–0.1)
EOS%: 7.6 % — ABNORMAL HIGH (ref 0.0–7.0)
Eosinophils Absolute: 0.4 10*3/uL (ref 0.0–0.5)
HCT: 30.9 % — ABNORMAL LOW (ref 38.4–49.9)
HGB: 9.4 g/dL — ABNORMAL LOW (ref 13.0–17.1)
LYMPH#: 0.9 10*3/uL (ref 0.9–3.3)
LYMPH%: 19.9 % (ref 14.0–49.0)
MCH: 24.9 pg — ABNORMAL LOW (ref 27.2–33.4)
MCHC: 30.6 g/dL — ABNORMAL LOW (ref 32.0–36.0)
MCV: 81.4 fL (ref 79.3–98.0)
MONO#: 0.7 10*3/uL (ref 0.1–0.9)
MONO%: 14.4 % — ABNORMAL HIGH (ref 0.0–14.0)
NEUT%: 57.6 % (ref 39.0–75.0)
NEUTROS ABS: 2.7 10*3/uL (ref 1.5–6.5)
Platelets: 127 10*3/uL — ABNORMAL LOW (ref 140–400)
RBC: 3.79 10*6/uL — AB (ref 4.20–5.82)
RDW: 14.8 % — AB (ref 11.0–14.6)
WBC: 4.8 10*3/uL (ref 4.0–10.3)

## 2014-10-23 LAB — COMPREHENSIVE METABOLIC PANEL (CC13)
ALT: 14 U/L (ref 0–55)
ANION GAP: 7 meq/L (ref 3–11)
AST: 13 U/L (ref 5–34)
Albumin: 3.6 g/dL (ref 3.5–5.0)
Alkaline Phosphatase: 37 U/L — ABNORMAL LOW (ref 40–150)
BILIRUBIN TOTAL: 1.65 mg/dL — AB (ref 0.20–1.20)
BUN: 12.1 mg/dL (ref 7.0–26.0)
CO2: 22 meq/L (ref 22–29)
CREATININE: 0.9 mg/dL (ref 0.7–1.3)
Calcium: 8.9 mg/dL (ref 8.4–10.4)
Chloride: 110 mEq/L — ABNORMAL HIGH (ref 98–109)
EGFR: >90 mL/min/{1.73_m2} (ref >90)
GLUCOSE: 127 mg/dL (ref 70–140)
Potassium: 4.4 mEq/L (ref 3.5–5.1)
SODIUM: 139 meq/L (ref 136–145)
TOTAL PROTEIN: 5.6 g/dL — AB (ref 6.4–8.3)

## 2014-10-23 MED ORDER — SODIUM CHLORIDE 0.9 % IV SOLN
Freq: Once | INTRAVENOUS | Status: AC
  Administered 2014-10-23: 10:00:00 via INTRAVENOUS

## 2014-10-23 MED ORDER — SODIUM CHLORIDE 0.9 % IV SOLN: Freq: Once | INTRAVENOUS | Status: DC

## 2014-10-23 MED ORDER — HEPARIN SOD (PORK) LOCK FLUSH 100 UNIT/ML IV SOLN
500.0000 [IU] | Freq: Once | INTRAVENOUS | Status: AC | PRN
  Administered 2014-10-23: 500 [IU]
  Filled 2014-10-23: qty 5

## 2014-10-23 MED ORDER — DEXTROSE 5 % IV SOLN
36.0000 mg/m2 | Freq: Once | INTRAVENOUS | Status: AC
  Administered 2014-10-23: 72 mg via INTRAVENOUS
  Filled 2014-10-23: qty 36

## 2014-10-23 MED ORDER — ZOLEDRONIC ACID 4 MG/100ML IV SOLN
4.0000 mg | Freq: Once | INTRAVENOUS | Status: AC
  Administered 2014-10-23: 4 mg via INTRAVENOUS
  Filled 2014-10-23: qty 100

## 2014-10-23 MED ORDER — SODIUM CHLORIDE 0.9 % IV SOLN
Freq: Once | INTRAVENOUS | Status: AC
  Administered 2014-10-23: 11:00:00 via INTRAVENOUS
  Filled 2014-10-23: qty 4

## 2014-10-23 MED ORDER — SODIUM CHLORIDE 0.9 % IJ SOLN
10.0000 mL | INTRAMUSCULAR | Status: DC | PRN
  Administered 2014-10-23: 10 mL
  Filled 2014-10-23: qty 10

## 2014-10-23 NOTE — Progress Notes (Signed)
Pt reports unexplained bruise to lower Rt FA that appeared last Wednesday, with a small knot at bruising site, No pain or discomfort. Diane RN, Dr. Worthy Flank nurse aware.  Bilirubin 1.65. DR Julien Nordmann aware. OKay to treat per Dr. Julien Nordmann.

## 2014-10-23 NOTE — Patient Instructions (Signed)
[facility] Discharge Instructions for Patients Receiving Chemotherapy  Today you received the following chemotherapy agents: Kyprolis   To help prevent nausea and vomiting after your treatment, we encourage you to take your nausea medication as directed   If you develop nausea and vomiting that is not controlled by your nausea medication, call the clinic.   BELOW ARE SYMPTOMS THAT SHOULD BE REPORTED IMMEDIATELY:  *FEVER GREATER THAN 100.5 F  *CHILLS WITH OR WITHOUT FEVER  NAUSEA AND VOMITING THAT IS NOT CONTROLLED WITH YOUR NAUSEA MEDICATION  *UNUSUAL SHORTNESS OF BREATH  *UNUSUAL BRUISING OR BLEEDING  TENDERNESS IN MOUTH AND THROAT WITH OR WITHOUT PRESENCE OF ULCERS  *URINARY PROBLEMS  *BOWEL PROBLEMS  UNUSUAL RASH Items with * indicate a potential emergency and should be followed up as soon as possible.  Feel free to call the clinic you have any questions or concerns. The clinic phone number is [phone].  Please show the [place] at check-in to the Emergency Department and triage nurse.  Zoledronic Acid injection (Hypercalcemia, Oncology) What is this medicine? ZOLEDRONIC ACID (ZOE le dron ik AS id) lowers the amount of calcium loss from bone. It is used to treat too much calcium in your blood from cancer. It is also used to prevent complications of cancer that has spread to the bone. This medicine may be used for other purposes; ask your health care provider or pharmacist if you have questions. COMMON BRAND NAME(S): Zometa What should I tell my health care provider before I take this medicine? They need to know if you have any of these conditions: -aspirin-sensitive asthma -cancer, especially if you are receiving medicines used to treat cancer -dental disease or wear dentures -infection -kidney disease -receiving corticosteroids like dexamethasone or prednisone -an unusual or allergic reaction to zoledronic acid, other medicines, foods,  dyes, or preservatives -pregnant or trying to get pregnant -breast-feeding How should I use this medicine? This medicine is for infusion into a vein. It is given by a health care professional in a hospital or clinic setting. Talk to your pediatrician regarding the use of this medicine in children. Special care may be needed. Overdosage: If you think you have taken too much of this medicine contact a poison control center or emergency room at once. NOTE: This medicine is only for you. Do not share this medicine with others. What if I miss a dose? It is important not to miss your dose. Call your doctor or health care professional if you are unable to keep an appointment. What may interact with this medicine? -certain antibiotics given by injection -NSAIDs, medicines for pain and inflammation, like ibuprofen or naproxen -some diuretics like bumetanide, furosemide -teriparatide -thalidomide This list may not describe all possible interactions. Give your health care provider a list of all the medicines, herbs, non-prescription drugs, or dietary supplements you use. Also tell them if you smoke, drink alcohol, or use illegal drugs. Some items may interact with your medicine. What should I watch for while using this medicine? Visit your doctor or health care professional for regular checkups. It may be some time before you see the benefit from this medicine. Do not stop taking your medicine unless your doctor tells you to. Your doctor may order blood tests or other tests to see how you are doing. Women should inform their doctor if they wish to become pregnant or think they might be pregnant. There is a potential for serious side effects to an unborn child. Talk to  your health care professional or pharmacist for more information. You should make sure that you get enough calcium and vitamin D while you are taking this medicine. Discuss the foods you eat and the vitamins you take with your health care  professional. Some people who take this medicine have severe bone, joint, and/or muscle pain. This medicine may also increase your risk for jaw problems or a broken thigh bone. Tell your doctor right away if you have severe pain in your jaw, bones, joints, or muscles. Tell your doctor if you have any pain that does not go away or that gets worse. Tell your dentist and dental surgeon that you are taking this medicine. You should not have major dental surgery while on this medicine. See your dentist to have a dental exam and fix any dental problems before starting this medicine. Take good care of your teeth while on this medicine. Make sure you see your dentist for regular follow-up appointments. What side effects may I notice from receiving this medicine? Side effects that you should report to your doctor or health care professional as soon as possible: -allergic reactions like skin rash, itching or hives, swelling of the face, lips, or tongue -anxiety, confusion, or depression -breathing problems -changes in vision -eye pain -feeling faint or lightheaded, falls -jaw pain, especially after dental work -mouth sores -muscle cramps, stiffness, or weakness -trouble passing urine or change in the amount of urine Side effects that usually do not require medical attention (report to your doctor or health care professional if they continue or are bothersome): -bone, joint, or muscle pain -constipation -diarrhea -fever -hair loss -irritation at site where injected -loss of appetite -nausea, vomiting -stomach upset -trouble sleeping -trouble swallowing -weak or tired This list may not describe all possible side effects. Call your doctor for medical advice about side effects. You may report side effects to FDA at 1-800-FDA-1088. Where should I keep my medicine? This drug is given in a hospital or clinic and will not be stored at home. NOTE: This sheet is a summary. It may not cover all possible  information. If you have questions about this medicine, talk to your doctor, pharmacist, or health care provider.  2015, [employer]. (2012-06-17 13:03:13)

## 2014-10-24 ENCOUNTER — Ambulatory Visit (HOSPITAL_BASED_OUTPATIENT_CLINIC_OR_DEPARTMENT_OTHER): Payer: Medicare Other

## 2014-10-24 VITALS — BP 151/71 | HR 47 | Temp 98.4°F | Resp 18

## 2014-10-24 DIAGNOSIS — C9002 Multiple myeloma in relapse: Secondary | ICD-10-CM | POA: Diagnosis not present

## 2014-10-24 DIAGNOSIS — Z5112 Encounter for antineoplastic immunotherapy: Secondary | ICD-10-CM | POA: Diagnosis present

## 2014-10-24 DIAGNOSIS — C9000 Multiple myeloma not having achieved remission: Secondary | ICD-10-CM

## 2014-10-24 MED ORDER — SODIUM CHLORIDE 0.9 % IV SOLN
Freq: Once | INTRAVENOUS | Status: AC
  Administered 2014-10-24: 11:00:00 via INTRAVENOUS
  Filled 2014-10-24: qty 4

## 2014-10-24 MED ORDER — CARFILZOMIB CHEMO INJECTION 60 MG
36.0000 mg/m2 | Freq: Once | INTRAVENOUS | Status: AC
  Administered 2014-10-24: 72 mg via INTRAVENOUS
  Filled 2014-10-24: qty 36

## 2014-10-24 MED ORDER — SODIUM CHLORIDE 0.9 % IV SOLN
Freq: Once | INTRAVENOUS | Status: AC
  Administered 2014-10-24: 11:00:00 via INTRAVENOUS

## 2014-10-24 MED ORDER — SODIUM CHLORIDE 0.9 % IJ SOLN
10.0000 mL | INTRAMUSCULAR | Status: DC | PRN
  Administered 2014-10-24: 10 mL
  Filled 2014-10-24: qty 10

## 2014-10-24 MED ORDER — HEPARIN SOD (PORK) LOCK FLUSH 100 UNIT/ML IV SOLN
500.0000 [IU] | Freq: Once | INTRAVENOUS | Status: AC | PRN
  Administered 2014-10-24: 500 [IU]
  Filled 2014-10-24: qty 5

## 2014-10-24 NOTE — Patient Instructions (Signed)
[facility] Discharge Instructions for Patients Receiving Chemotherapy  Today you received the following chemotherapy agents: Kyprolis   To help prevent nausea and vomiting after your treatment, we encourage you to take your nausea medication as directed   If you develop nausea and vomiting that is not controlled by your nausea medication, call the clinic.   BELOW ARE SYMPTOMS THAT SHOULD BE REPORTED IMMEDIATELY:  *FEVER GREATER THAN 100.5 F  *CHILLS WITH OR WITHOUT FEVER  NAUSEA AND VOMITING THAT IS NOT CONTROLLED WITH YOUR NAUSEA MEDICATION  *UNUSUAL SHORTNESS OF BREATH  *UNUSUAL BRUISING OR BLEEDING  TENDERNESS IN MOUTH AND THROAT WITH OR WITHOUT PRESENCE OF ULCERS  *URINARY PROBLEMS  *BOWEL PROBLEMS  UNUSUAL RASH Items with * indicate a potential emergency and should be followed up as soon as possible.  Feel free to call the clinic you have any questions or concerns. The clinic phone number is [phone].  Please show the [facility] at check-in to the Emergency Department and triage nurse.  Zoledronic Acid injection (Hypercalcemia, Oncology) What is this medicine? ZOLEDRONIC ACID (ZOE le dron ik AS id) lowers the amount of calcium loss from bone. It is used to treat too much calcium in your blood from cancer. It is also used to prevent complications of cancer that has spread to the bone. This medicine may be used for other purposes; ask your health care provider or pharmacist if you have questions. COMMON BRAND NAME(S): Zometa What should I tell my health care provider before I take this medicine? They need to know if you have any of these conditions: -aspirin-sensitive asthma -cancer, especially if you are receiving medicines used to treat cancer -dental disease or wear dentures -infection -kidney disease -receiving corticosteroids like dexamethasone or prednisone -an unusual or allergic reaction to zoledronic acid, other medicines, foods,  dyes, or preservatives -pregnant or trying to get pregnant -breast-feeding How should I use this medicine? This medicine is for infusion into a vein. It is given by a health care professional in a hospital or clinic setting. Talk to your pediatrician regarding the use of this medicine in children. Special care may be needed. Overdosage: If you think you have taken too much of this medicine contact a poison control center or emergency room at once. NOTE: This medicine is only for you. Do not share this medicine with others. What if I miss a dose? It is important not to miss your dose. Call your doctor or health care professional if you are unable to keep an appointment. What may interact with this medicine? -certain antibiotics given by injection -NSAIDs, medicines for pain and inflammation, like ibuprofen or naproxen -some diuretics like bumetanide, furosemide -teriparatide -thalidomide This list may not describe all possible interactions. Give your health care provider a list of all the medicines, herbs, non-prescription drugs, or dietary supplements you use. Also tell them if you smoke, drink alcohol, or use illegal drugs. Some items may interact with your medicine. What should I watch for while using this medicine? Visit your doctor or health care professional for regular checkups. It may be some time before you see the benefit from this medicine. Do not stop taking your medicine unless your doctor tells you to. Your doctor may order blood tests or other tests to see how you are doing. Women should inform their doctor if they wish to become pregnant or think they might be pregnant. There is a potential for serious side effects to an unborn child. Talk to  your health care professional or pharmacist for more information. You should make sure that you get enough calcium and vitamin D while you are taking this medicine. Discuss the foods you eat and the vitamins you take with your health care  professional. Some people who take this medicine have severe bone, joint, and/or muscle pain. This medicine may also increase your risk for jaw problems or a broken thigh bone. Tell your doctor right away if you have severe pain in your jaw, bones, joints, or muscles. Tell your doctor if you have any pain that does not go away or that gets worse. Tell your dentist and dental surgeon that you are taking this medicine. You should not have major dental surgery while on this medicine. See your dentist to have a dental exam and fix any dental problems before starting this medicine. Take good care of your teeth while on this medicine. Make sure you see your dentist for regular follow-up appointments. What side effects may I notice from receiving this medicine? Side effects that you should report to your doctor or health care professional as soon as possible: -allergic reactions like skin rash, itching or hives, swelling of the face, lips, or tongue -anxiety, confusion, or depression -breathing problems -changes in vision -eye pain -feeling faint or lightheaded, falls -jaw pain, especially after dental work -mouth sores -muscle cramps, stiffness, or weakness -trouble passing urine or change in the amount of urine Side effects that usually do not require medical attention (report to your doctor or health care professional if they continue or are bothersome): -bone, joint, or muscle pain -constipation -diarrhea -fever -hair loss -irritation at site where injected -loss of appetite -nausea, vomiting -stomach upset -trouble sleeping -trouble swallowing -weak or tired This list may not describe all possible side effects. Call your doctor for medical advice about side effects. You may report side effects to FDA at 1-800-FDA-1088. Where should I keep my medicine? This drug is given in a hospital or clinic and will not be stored at home. NOTE: This sheet is a summary. It may not cover all possible  information. If you have questions about this medicine, talk to your doctor, pharmacist, or health care provider.  2015, [employer]vier/Gold Standard. (2012-06-17 13:03:13)

## 2014-10-25 ENCOUNTER — Other Ambulatory Visit: Payer: Self-pay | Admitting: Gastroenterology

## 2014-10-25 ENCOUNTER — Telehealth: Payer: Self-pay | Admitting: Medical Oncology

## 2014-10-25 NOTE — Telephone Encounter (Signed)
Pt reported he woke up last wed and had dark , spot , 'a bruise" on right arm lower below elbow. He says he may have hit it going to bathroom or sometimes his wife lays her elbow on his arm.  He reports today it  looks like it is healing.I told him to call if he has any swelling in the arm or pain or it does not heal.

## 2014-10-30 ENCOUNTER — Ambulatory Visit: Payer: Medicare Other

## 2014-10-30 ENCOUNTER — Ambulatory Visit: Payer: Medicare Other | Admitting: Internal Medicine

## 2014-10-30 ENCOUNTER — Other Ambulatory Visit: Payer: Medicare Other

## 2014-10-31 ENCOUNTER — Ambulatory Visit: Payer: Medicare Other

## 2014-11-01 ENCOUNTER — Telehealth: Payer: Self-pay | Admitting: *Deleted

## 2014-11-01 NOTE — Telephone Encounter (Signed)
Received vm message from [facility] requesting prescriptipn for pt's Pomalyst as he is being restarted on this.

## 2014-11-02 ENCOUNTER — Telehealth: Payer: Self-pay | Admitting: *Deleted

## 2014-11-02 NOTE — Telephone Encounter (Signed)
Call from pt - concern regarding Pomalyst refill. Pt's pharmacy advised they need rx. Called [employer] who advised pt's rx has been authorized and they are waiting on pt to complete survey prior to rx being filled. Authorization is valid for another 2 days. Called pt back to discuss this, pt advised he attempted to do survey and was told by [employer] rep he did not have to complete one. Requested pt to please call re-attempt taking survey if run into issues or concerns to call back to office. Pt verbalized understanding advised he will call right now.

## 2014-11-02 NOTE — Telephone Encounter (Signed)
TC from patient this morning regarding Pomalyst refill. Spoke with Stanton Kidney, RN and it is being faxd this morning. Return call to patient and made him aware.

## 2014-11-06 ENCOUNTER — Other Ambulatory Visit (HOSPITAL_BASED_OUTPATIENT_CLINIC_OR_DEPARTMENT_OTHER): Payer: Medicare Other

## 2014-11-06 ENCOUNTER — Ambulatory Visit (HOSPITAL_BASED_OUTPATIENT_CLINIC_OR_DEPARTMENT_OTHER): Payer: Medicare Other | Admitting: Internal Medicine

## 2014-11-06 ENCOUNTER — Ambulatory Visit (HOSPITAL_BASED_OUTPATIENT_CLINIC_OR_DEPARTMENT_OTHER): Payer: Medicare Other

## 2014-11-06 ENCOUNTER — Encounter: Payer: Self-pay | Admitting: Internal Medicine

## 2014-11-06 ENCOUNTER — Telehealth: Payer: Self-pay | Admitting: Internal Medicine

## 2014-11-06 VITALS — BP 148/71 | HR 57 | Temp 97.5°F | Resp 20 | Ht 73.0 in | Wt 178.2 lb

## 2014-11-06 DIAGNOSIS — C9000 Multiple myeloma not having achieved remission: Secondary | ICD-10-CM

## 2014-11-06 DIAGNOSIS — Z5112 Encounter for antineoplastic immunotherapy: Secondary | ICD-10-CM

## 2014-11-06 DIAGNOSIS — I82409 Acute embolism and thrombosis of unspecified deep veins of unspecified lower extremity: Secondary | ICD-10-CM

## 2014-11-06 DIAGNOSIS — D61818 Other pancytopenia: Secondary | ICD-10-CM

## 2014-11-06 DIAGNOSIS — C9002 Multiple myeloma in relapse: Secondary | ICD-10-CM

## 2014-11-06 LAB — CBC WITH DIFFERENTIAL/PLATELET
BASO%: 1 % (ref 0.0–2.0)
BASOS ABS: 0 10*3/uL (ref 0.0–0.1)
EOS ABS: 0 10*3/uL (ref 0.0–0.5)
EOS%: 1 % (ref 0.0–7.0)
HCT: 30.7 % — ABNORMAL LOW (ref 38.4–49.9)
HGB: 9.4 g/dL — ABNORMAL LOW (ref 13.0–17.1)
LYMPH%: 28.3 % (ref 14.0–49.0)
MCH: 25 pg — AB (ref 27.2–33.4)
MCHC: 30.6 g/dL — AB (ref 32.0–36.0)
MCV: 81.6 fL (ref 79.3–98.0)
MONO#: 0.5 10*3/uL (ref 0.1–0.9)
MONO%: 18.3 % — AB (ref 0.0–14.0)
NEUT#: 1.5 10*3/uL (ref 1.5–6.5)
NEUT%: 51.4 % (ref 39.0–75.0)
Platelets: 166 10*3/uL (ref 140–400)
RBC: 3.76 10*6/uL — AB (ref 4.20–5.82)
RDW: 16.1 % — ABNORMAL HIGH (ref 11.0–14.6)
WBC: 2.9 10*3/uL — AB (ref 4.0–10.3)
lymph#: 0.8 10*3/uL — ABNORMAL LOW (ref 0.9–3.3)

## 2014-11-06 LAB — COMPREHENSIVE METABOLIC PANEL (CC13)
ALT: 12 U/L (ref 0–55)
ANION GAP: 6 meq/L (ref 3–11)
AST: 12 U/L (ref 5–34)
Albumin: 3.7 g/dL (ref 3.5–5.0)
Alkaline Phosphatase: 34 U/L — ABNORMAL LOW (ref 40–150)
BUN: 13.7 mg/dL (ref 7.0–26.0)
CO2: 25 meq/L (ref 22–29)
Calcium: 8.8 mg/dL (ref 8.4–10.4)
Chloride: 110 mEq/L — ABNORMAL HIGH (ref 98–109)
Creatinine: 0.9 mg/dL (ref 0.7–1.3)
GLUCOSE: 128 mg/dL (ref 70–140)
POTASSIUM: 4.1 meq/L (ref 3.5–5.1)
SODIUM: 140 meq/L (ref 136–145)
Total Bilirubin: 1.81 mg/dL — ABNORMAL HIGH (ref 0.20–1.20)
Total Protein: 5.6 g/dL — ABNORMAL LOW (ref 6.4–8.3)

## 2014-11-06 MED ORDER — HEPARIN SOD (PORK) LOCK FLUSH 100 UNIT/ML IV SOLN
500.0000 [IU] | Freq: Once | INTRAVENOUS | Status: AC | PRN
  Administered 2014-11-06: 500 [IU]
  Filled 2014-11-06: qty 5

## 2014-11-06 MED ORDER — CARFILZOMIB CHEMO INJECTION 60 MG
36.0000 mg/m2 | Freq: Once | INTRAVENOUS | Status: AC
  Administered 2014-11-06: 72 mg via INTRAVENOUS
  Filled 2014-11-06: qty 36

## 2014-11-06 MED ORDER — SODIUM CHLORIDE 0.9 % IV SOLN
Freq: Once | INTRAVENOUS | Status: AC
  Administered 2014-11-06: 13:00:00 via INTRAVENOUS
  Filled 2014-11-06: qty 4

## 2014-11-06 MED ORDER — SODIUM CHLORIDE 0.9 % IJ SOLN
10.0000 mL | INTRAMUSCULAR | Status: DC | PRN
  Administered 2014-11-06: 10 mL
  Filled 2014-11-06: qty 10

## 2014-11-06 MED ORDER — SODIUM CHLORIDE 0.9 % IV SOLN
Freq: Once | INTRAVENOUS | Status: AC
  Administered 2014-11-06: 12:00:00 via INTRAVENOUS

## 2014-11-06 MED ORDER — SODIUM CHLORIDE 0.9 % IV SOLN: Freq: Once | INTRAVENOUS | Status: DC

## 2014-11-06 NOTE — Patient Instructions (Signed)
[facility] Discharge Instructions for Patients Receiving Chemotherapy  Today you received the following chemotherapy agents: Kyprolis   To help prevent nausea and vomiting after your treatment, we encourage you to take your nausea medication as directed.    If you develop nausea and vomiting that is not controlled by your nausea medication, call the clinic.   BELOW ARE SYMPTOMS THAT SHOULD BE REPORTED IMMEDIATELY:  *FEVER GREATER THAN 100.5 F  *CHILLS WITH OR WITHOUT FEVER  NAUSEA AND VOMITING THAT IS NOT CONTROLLED WITH YOUR NAUSEA MEDICATION  *UNUSUAL SHORTNESS OF BREATH  *UNUSUAL BRUISING OR BLEEDING  TENDERNESS IN MOUTH AND THROAT WITH OR WITHOUT PRESENCE OF ULCERS  *URINARY PROBLEMS  *BOWEL PROBLEMS  UNUSUAL RASH Items with * indicate a potential emergency and should be followed up as soon as possible.  Feel free to call the clinic you have any questions or concerns. The clinic phone number is [phone].  Please show the CHEMO ALERT CARD at check-in to the Emergency Department and triage nurse.   

## 2014-11-06 NOTE — Telephone Encounter (Signed)
por entered-pt just needed copy-appts already made

## 2014-11-06 NOTE — Progress Notes (Signed)
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  2) the venous thrombosis diagnosed in June 2016  PRIOR THERAPY: :  1. Status post 6 cycles of systemic chemotherapy with Revlimid and Decadron, last dose was given 07/21/2010 with very good response. 2. 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 Ok Thomas.  3. maintenance Revlimid at 10 mg by mouth daily status post 2 months. Therapy began 01/18/2011. 4. maintenance Revlimid at 15 mg by mouth daily with prophylactic dose Coumadin at 2 mg by mouth daily. 5. Systemic chemotherapy with Velcade at 1.3 mg per meter squared given on days 1, 4, 8 and 11 and Doxil at 30 mg per meter square given on day 4 and Decadron 40 mg by mouth on weekly basis given every 3 weeks. Status post 4 cycles. 6. Zometa 4 mg IV every 4 weeks.  7. Velcade 1.3 mg/M2 subcutaneous daily on a weekly basis with Decadron 20 mg by mouth on a weekly basis. First cycle expected on 06/21/2012. S/P 4 cycles. 8. Systemic chemotherapy with Carfilzomib, cyclophosphamide and Decadron. First cycle started on 08/09/2012. He is status post 19 cycles.   CURRENT THERAPY:  1) Systemic chemotherapy with Carfilzomib 36 MG/M2 on days 1, 2, 8, 9, 15 and 16 every 4 weeks, Pomalyst 4 mg by mouth daily for 21 days every 4 weeks and Decadron 40 mg by mouth weekly. First cycle started on 04/10/2014. Status post 7 cycles. 2) Zometa every 8 weeks.  3) Xarelto 20 mg by mouth daily, started 07/17/2014.   INTERVAL HISTORY: Eddie Thomas 69 y.o. male returns to the clinic today for followup visit. He is currently on treatment with Carfilzomib, Pomalyst and dexamethasone status post 7 cycle and tolerating his treatment fairly well except for  mild pancytopenia. He denied having any significant weight loss or night sweats. He has no fever or chills. He has no nausea or vomiting. The patient denied having any significant chest pain, shortness of breath, cough or hemoptysis. He is here today to start cycle #8.  MEDICAL HISTORY: Past Medical History  Diagnosis Date  . Multiple myeloma   . Diabetes mellitus ([place]) 07/03/2011  . Hypertension 07/03/2011  . Hyperlipidemia 07/03/2011  . Colon polyps 2012  . Gastric ulcer     ALLERGIES:  has No Known Allergies.  MEDICATIONS:  Current Outpatient Prescriptions  Medication Sig Dispense Refill  . Cholecalciferol (VITAMIN D-3) 5000 UNITS TABS Take 1 tablet by mouth daily.    [name] dexamethasone (DECADRON) 4 MG tablet TAKE FIVE TABLETS BY MOUTH EVERY WEEK START WITH THE FIRST DOSE OF CHEMOTHERAPY 80 tablet 0  . lidocaine-prilocaine (EMLA) cream Apply 1 application topically as needed. Apply one hour before use and cover with plastic wrap. 30 g 1  . lisinopril (PRINIVIL,ZESTRIL) 10 MG tablet Take 5 mg by mouth every evening.     [name] oxyCODONE-acetaminophen (PERCOCET/ROXICET) 5-325 MG per tablet Take 1 tablet by mouth every 6 (six) hours as needed for severe pain. 30 tablet 0  . pantoprazole (PROTONIX) 40 MG tablet TAKE ONE TABLET BY MOUTH TWICE DAILY 60 tablet 0  . pioglitazone (ACTOS) 15 MG tablet Take 15 mg by mouth daily.    . pomalidomide (POMALYST) 4 MG capsule Take 1 capsule (4 mg total)  by mouth daily. Take with water on days 1-21. Repeat every 28 days.authorization number [id number]  10/05/14-adult male 21 capsule 0  . prochlorperazine (COMPAZINE) 10 MG tablet Take 10 mg by mouth every 6 (six) hours as needed for nausea or vomiting.    . rivaroxaban (XARELTO) 20 MG TABS tablet Take 1 tablet (20 mg total) by mouth daily with supper. 30 tablet 1  . simvastatin (ZOCOR) 10 MG tablet Take 10 mg by mouth at bedtime.      . temazepam (RESTORIL) 15 MG capsule Take 15 mg by mouth at bedtime as needed for  sleep.    . valACYclovir (VALTREX) 500 MG tablet TAKE ONE CAPLET BY MOUTH ONCE DAILY 30 tablet 3  . VIAGRA 50 MG tablet Take 50 mg by mouth daily as needed for erectile dysfunction.      No current facility-administered medications for this visit.    SURGICAL HISTORY:  Past Surgical History  Procedure Laterality Date  . Limbal stem cell transplant    . Humerus fracture surgery      right  . 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    REVIEW OF SYSTEMS:  A comprehensive review of systems was negative except for: Constitutional: positive for fatigue   PHYSICAL EXAMINATION: General appearance: alert, cooperative and no distress Head: Normocephalic, without obvious abnormality, atraumatic Neck: no adenopathy, no JVD, supple, symmetrical, trachea midline and thyroid not enlarged, symmetric, no tenderness/mass/nodules Lymph nodes: Cervical, supraclavicular, and axillary nodes normal. Resp: clear to auscultation bilaterally Back: symmetric, no curvature. ROM normal. No CVA tenderness. Cardio: regular rate and rhythm, S1, S2 normal, no murmur, click, rub or gallop GI: soft, non-tender; bowel sounds normal; no masses,  no organomegaly Extremities: extremities normal, atraumatic, no cyanosis or edema Neurologic: Alert and oriented X 3, normal strength and tone. Normal symmetric reflexes. Normal coordination and gait  ECOG PERFORMANCE STATUS: 1 - Symptomatic but completely ambulatory  Blood pressure 148/71, pulse 57, temperature 97.5 F (36.4 C), temperature source Oral, resp. rate 20, height 6' 1"  (1.854 m), weight 178 lb 3.2 oz (80.831 kg), SpO2 100 %.  LABORATORY DATA: Lab Results  Component Value Date   WBC 2.9* 11/06/2014   HGB 9.4* 11/06/2014   HCT 30.7* 11/06/2014   MCV 81.6 11/06/2014   PLT 166 11/06/2014      Chemistry      Component Value Date/Time   NA 140 11/06/2014 1042   NA 139 08/30/2013 0935   K 4.1 11/06/2014 1042   K 4.1 08/30/2013 0935   CL  101 08/30/2013 0935   CL 103 07/12/2012 0907   CO2 25 11/06/2014 1042   CO2 26 08/30/2013 0935   BUN 13.7 11/06/2014 1042   BUN 11 08/30/2013 0935   CREATININE 0.9 11/06/2014 1042   CREATININE 1.03 08/30/2013 0935      Component Value Date/Time   CALCIUM 8.8 11/06/2014 1042   CALCIUM 9.2 08/30/2013 0935   ALKPHOS 34* 11/06/2014 1042   ALKPHOS 39 08/30/2013 0935   AST 12 11/06/2014 1042   AST 13 08/30/2013 0935   ALT 12 11/06/2014 1042   ALT 9 08/30/2013 0935   BILITOT 1.81* 11/06/2014 1042   BILITOT 1.3* 08/30/2013 0935     Other lab results:   RADIOGRAPHIC STUDIES: No results found.  ASSESSMENT AND PLAN: This is a very pleasant 69 years old [place] American male with history of multiple myeloma currently undergoing systemic chemotherapy with Carfilzomib, cyclophosphamide and Decadron status post 1  cycles. This was discontinued secondary to disease progression. The patient was started on systemic chemotherapy with Carfilzomib, Pomalyst and Decadron status post 7 cycles. He is tolerating his treatment fairly well except for mild pancytopenia We will proceed with cycle #8 today as a scheduled. The patient would come back for follow-up visit in 4 weeks before starting cycle #9 for evaluation. He'll continue on Zometa every 2 months. For the venous thrombosis, he will continue on Xarelto 20 mg by mouth daily. He was advised to call immediately if he has any concerning symptoms in the interval.  The patient voices understanding of current disease status and treatment options and is in agreement with the current care plan. All questions were answered. The patient knows to call the clinic with any problems, questions or concerns. We can certainly see the patient much sooner if necessary.  Disclaimer: This note was dictated with voice recognition software. Similar sounding words can inadvertently be transcribed and may not be corrected upon review.

## 2014-11-07 ENCOUNTER — Ambulatory Visit (HOSPITAL_BASED_OUTPATIENT_CLINIC_OR_DEPARTMENT_OTHER): Payer: Medicare Other

## 2014-11-07 VITALS — BP 146/78 | HR 80 | Temp 98.3°F | Resp 18

## 2014-11-07 DIAGNOSIS — C9000 Multiple myeloma not having achieved remission: Secondary | ICD-10-CM

## 2014-11-07 DIAGNOSIS — Z5112 Encounter for antineoplastic immunotherapy: Secondary | ICD-10-CM | POA: Diagnosis present

## 2014-11-07 DIAGNOSIS — C9002 Multiple myeloma in relapse: Secondary | ICD-10-CM | POA: Diagnosis not present

## 2014-11-07 MED ORDER — SODIUM CHLORIDE 0.9 % IJ SOLN
10.0000 mL | INTRAMUSCULAR | Status: DC | PRN
  Administered 2014-11-07: 10 mL
  Filled 2014-11-07: qty 10

## 2014-11-07 MED ORDER — DEXTROSE 5 % IV SOLN
36.0000 mg/m2 | Freq: Once | INTRAVENOUS | Status: AC
  Administered 2014-11-07: 72 mg via INTRAVENOUS
  Filled 2014-11-07: qty 36

## 2014-11-07 MED ORDER — SODIUM CHLORIDE 0.9 % IV SOLN: Freq: Once | INTRAVENOUS | Status: DC

## 2014-11-07 MED ORDER — SODIUM CHLORIDE 0.9 % IV SOLN
Freq: Once | INTRAVENOUS | Status: AC
  Administered 2014-11-07: 09:00:00 via INTRAVENOUS

## 2014-11-07 MED ORDER — SODIUM CHLORIDE 0.9 % IV SOLN
Freq: Once | INTRAVENOUS | Status: AC
  Administered 2014-11-07: 09:00:00 via INTRAVENOUS
  Filled 2014-11-07: qty 4

## 2014-11-07 MED ORDER — HEPARIN SOD (PORK) LOCK FLUSH 100 UNIT/ML IV SOLN
500.0000 [IU] | Freq: Once | INTRAVENOUS | Status: AC | PRN
  Administered 2014-11-07: 500 [IU]
  Filled 2014-11-07: qty 5

## 2014-11-07 NOTE — Patient Instructions (Signed)
[facility] Discharge Instructions for Patients Receiving Chemotherapy  Today you received the following chemotherapy agents: Kyprolis   To help prevent nausea and vomiting after your treatment, we encourage you to take your nausea medication as directed.    If you develop nausea and vomiting that is not controlled by your nausea medication, call the clinic.   BELOW ARE SYMPTOMS THAT SHOULD BE REPORTED IMMEDIATELY:  *FEVER GREATER THAN 100.5 F  *CHILLS WITH OR WITHOUT FEVER  NAUSEA AND VOMITING THAT IS NOT CONTROLLED WITH YOUR NAUSEA MEDICATION  *UNUSUAL SHORTNESS OF BREATH  *UNUSUAL BRUISING OR BLEEDING  TENDERNESS IN MOUTH AND THROAT WITH OR WITHOUT PRESENCE OF ULCERS  *URINARY PROBLEMS  *BOWEL PROBLEMS  UNUSUAL RASH Items with * indicate a potential emergency and should be followed up as soon as possible.  Feel free to call the clinic you have any questions or concerns. The clinic phone number is [phone].  Please show the CHEMO ALERT CARD at check-in to the Emergency Department and triage nurse.   

## 2014-11-13 ENCOUNTER — Other Ambulatory Visit: Payer: Medicare Other

## 2014-11-13 ENCOUNTER — Telehealth: Payer: Self-pay | Admitting: *Deleted

## 2014-11-13 ENCOUNTER — Ambulatory Visit (HOSPITAL_BASED_OUTPATIENT_CLINIC_OR_DEPARTMENT_OTHER): Payer: Medicare Other

## 2014-11-13 ENCOUNTER — Ambulatory Visit: Payer: Medicare Other | Admitting: Internal Medicine

## 2014-11-13 VITALS — BP 151/82 | HR 57 | Temp 97.1°F | Resp 18

## 2014-11-13 DIAGNOSIS — C9002 Multiple myeloma in relapse: Secondary | ICD-10-CM

## 2014-11-13 DIAGNOSIS — Z5112 Encounter for antineoplastic immunotherapy: Secondary | ICD-10-CM

## 2014-11-13 DIAGNOSIS — C9000 Multiple myeloma not having achieved remission: Secondary | ICD-10-CM

## 2014-11-13 LAB — CBC WITH DIFFERENTIAL/PLATELET
BASO%: 0.2 % (ref 0.0–2.0)
BASOS ABS: 0 10*3/uL (ref 0.0–0.1)
EOS ABS: 0.1 10*3/uL (ref 0.0–0.5)
EOS%: 2 % (ref 0.0–7.0)
HCT: 30.6 % — ABNORMAL LOW (ref 38.4–49.9)
HGB: 9.5 g/dL — ABNORMAL LOW (ref 13.0–17.1)
LYMPH%: 21.7 % (ref 14.0–49.0)
MCH: 25.2 pg — AB (ref 27.2–33.4)
MCHC: 31 g/dL — AB (ref 32.0–36.0)
MCV: 81.1 fL (ref 79.3–98.0)
MONO#: 0.3 10*3/uL (ref 0.1–0.9)
MONO%: 7.8 % (ref 0.0–14.0)
NEUT%: 68.3 % (ref 39.0–75.0)
NEUTROS ABS: 2.3 10*3/uL (ref 1.5–6.5)
PLATELETS: 112 10*3/uL — AB (ref 140–400)
RBC: 3.77 10*6/uL — AB (ref 4.20–5.82)
RDW: 15.7 % — ABNORMAL HIGH (ref 11.0–14.6)
WBC: 3.3 10*3/uL — AB (ref 4.0–10.3)
lymph#: 0.7 10*3/uL — ABNORMAL LOW (ref 0.9–3.3)

## 2014-11-13 LAB — COMPREHENSIVE METABOLIC PANEL (CC13)
ALT: 11 U/L (ref 0–55)
ANION GAP: 6 meq/L (ref 3–11)
AST: 11 U/L (ref 5–34)
Albumin: 3.6 g/dL (ref 3.5–5.0)
Alkaline Phosphatase: 37 U/L — ABNORMAL LOW (ref 40–150)
BILIRUBIN TOTAL: 1.98 mg/dL — AB (ref 0.20–1.20)
BUN: 9.9 mg/dL (ref 7.0–26.0)
CO2: 26 meq/L (ref 22–29)
CREATININE: 1 mg/dL (ref 0.7–1.3)
Calcium: 9.1 mg/dL (ref 8.4–10.4)
Chloride: 108 mEq/L (ref 98–109)
Glucose: 132 mg/dl (ref 70–140)
Potassium: 4 mEq/L (ref 3.5–5.1)
SODIUM: 140 meq/L (ref 136–145)
TOTAL PROTEIN: 5.6 g/dL — AB (ref 6.4–8.3)

## 2014-11-13 MED ORDER — SODIUM CHLORIDE 0.9 % IV SOLN: Freq: Once | INTRAVENOUS | Status: DC

## 2014-11-13 MED ORDER — DEXAMETHASONE SODIUM PHOSPHATE 100 MG/10ML IJ SOLN
Freq: Once | INTRAMUSCULAR | Status: AC
  Administered 2014-11-13: 10:00:00 via INTRAVENOUS
  Filled 2014-11-13: qty 4

## 2014-11-13 MED ORDER — DEXTROSE 5 % IV SOLN
36.0000 mg/m2 | Freq: Once | INTRAVENOUS | Status: AC
  Administered 2014-11-13: 72 mg via INTRAVENOUS
  Filled 2014-11-13: qty 36

## 2014-11-13 MED ORDER — VALACYCLOVIR HCL 500 MG PO TABS
ORAL_TABLET | ORAL | Status: DC
Start: 2014-11-13 — End: 2015-05-01

## 2014-11-13 MED ORDER — FLUCONAZOLE 100 MG PO TABS: 100.0000 mg | ORAL_TABLET | Freq: Every day | ORAL | Status: DC

## 2014-11-13 MED ORDER — SODIUM CHLORIDE 0.9 % IV SOLN
Freq: Once | INTRAVENOUS | Status: AC
  Administered 2014-11-13: 10:00:00 via INTRAVENOUS

## 2014-11-13 MED ORDER — HEPARIN SOD (PORK) LOCK FLUSH 100 UNIT/ML IV SOLN
500.0000 [IU] | Freq: Once | INTRAVENOUS | Status: AC | PRN
  Administered 2014-11-13: 500 [IU]
  Filled 2014-11-13: qty 5

## 2014-11-13 MED ORDER — SODIUM CHLORIDE 0.9 % IJ SOLN
10.0000 mL | INTRAMUSCULAR | Status: DC | PRN
  Administered 2014-11-13: 10 mL
  Filled 2014-11-13: qty 10

## 2014-11-13 NOTE — Telephone Encounter (Signed)
TC from patient requesting refill on his Valtrex. This escribed today with 3 refills.

## 2014-11-13 NOTE — Progress Notes (Signed)
Per Dr. Julien Nordmann, okay to tx with total bilirubin 1.98. Upon assessment, pt reports white coating to tongue, which is thrush. Dr. Julien Nordmann made aware; okay to order diflucan 100 mg qd x 7 days, no refills. Pt voices understanding. Med e-scribed to pt's preferred pharmacy.

## 2014-11-13 NOTE — Patient Instructions (Signed)
[name] Discharge Instructions for Patients Receiving Chemotherapy  Today you received the following chemotherapy agents: Kyprolis  To help prevent nausea and vomiting after your treatment, we encourage you to take your nausea medication as prescribed by your physician.   If you develop nausea and vomiting that is not controlled by your nausea medication, call the clinic.   BELOW ARE SYMPTOMS THAT SHOULD BE REPORTED IMMEDIATELY:  *FEVER GREATER THAN 100.5 F  *CHILLS WITH OR WITHOUT FEVER  NAUSEA AND VOMITING THAT IS NOT CONTROLLED WITH YOUR NAUSEA MEDICATION  *UNUSUAL SHORTNESS OF BREATH  *UNUSUAL BRUISING OR BLEEDING  TENDERNESS IN MOUTH AND THROAT WITH OR WITHOUT PRESENCE OF ULCERS  *URINARY PROBLEMS  *BOWEL PROBLEMS  UNUSUAL RASH Items with * indicate a potential emergency and should be followed up as soon as possible.  Feel free to call the clinic you have any questions or concerns. The clinic phone number is [phone].  Please show the CHEMO ALERT CARD at check-in to the Emergency Department and triage nurse.   

## 2014-11-14 ENCOUNTER — Ambulatory Visit (HOSPITAL_BASED_OUTPATIENT_CLINIC_OR_DEPARTMENT_OTHER): Payer: Medicare Other

## 2014-11-14 VITALS — BP 154/72 | HR 52 | Temp 97.9°F

## 2014-11-14 DIAGNOSIS — Z5112 Encounter for antineoplastic immunotherapy: Secondary | ICD-10-CM | POA: Diagnosis present

## 2014-11-14 DIAGNOSIS — C9002 Multiple myeloma in relapse: Secondary | ICD-10-CM

## 2014-11-14 DIAGNOSIS — C9000 Multiple myeloma not having achieved remission: Secondary | ICD-10-CM

## 2014-11-14 MED ORDER — DEXAMETHASONE SODIUM PHOSPHATE 100 MG/10ML IJ SOLN
Freq: Once | INTRAMUSCULAR | Status: AC
  Administered 2014-11-14: 10:00:00 via INTRAVENOUS
  Filled 2014-11-14: qty 4

## 2014-11-14 MED ORDER — SODIUM CHLORIDE 0.9 % IJ SOLN
10.0000 mL | INTRAMUSCULAR | Status: DC | PRN
  Administered 2014-11-14: 10 mL
  Filled 2014-11-14: qty 10

## 2014-11-14 MED ORDER — SODIUM CHLORIDE 0.9 % IV SOLN
Freq: Once | INTRAVENOUS | Status: AC
  Administered 2014-11-14: 09:00:00 via INTRAVENOUS

## 2014-11-14 MED ORDER — HEPARIN SOD (PORK) LOCK FLUSH 100 UNIT/ML IV SOLN
500.0000 [IU] | Freq: Once | INTRAVENOUS | Status: AC | PRN
  Administered 2014-11-14: 500 [IU]
  Filled 2014-11-14: qty 5

## 2014-11-14 MED ORDER — DEXTROSE 5 % IV SOLN
36.0000 mg/m2 | Freq: Once | INTRAVENOUS | Status: AC
  Administered 2014-11-14: 72 mg via INTRAVENOUS
  Filled 2014-11-14: qty 36

## 2014-11-14 MED ORDER — SODIUM CHLORIDE 0.9 % IV SOLN
Freq: Once | INTRAVENOUS | Status: DC
Start: 2014-11-14 — End: 2014-11-14

## 2014-11-14 NOTE — Patient Instructions (Signed)
[facility] Discharge Instructions for Patients Receiving Chemotherapy  Today you received the following chemotherapy agents: Kyprolis  To help prevent nausea and vomiting after your treatment, we encourage you to take your nausea medication as prescribed by your physician.   If you develop nausea and vomiting that is not controlled by your nausea medication, call the clinic.   BELOW ARE SYMPTOMS THAT SHOULD BE REPORTED IMMEDIATELY:  *FEVER GREATER THAN 100.5 F  *CHILLS WITH OR WITHOUT FEVER  NAUSEA AND VOMITING THAT IS NOT CONTROLLED WITH YOUR NAUSEA MEDICATION  *UNUSUAL SHORTNESS OF BREATH  *UNUSUAL BRUISING OR BLEEDING  TENDERNESS IN MOUTH AND THROAT WITH OR WITHOUT PRESENCE OF ULCERS  *URINARY PROBLEMS  *BOWEL PROBLEMS  UNUSUAL RASH Items with * indicate a potential emergency and should be followed up as soon as possible.  Feel free to call the clinic you have any questions or concerns. The clinic phone number is [phone].  Please show the CHEMO ALERT CARD at check-in to the Emergency Department and triage nurse.   

## 2014-11-20 ENCOUNTER — Other Ambulatory Visit: Payer: Self-pay | Admitting: Internal Medicine

## 2014-11-20 ENCOUNTER — Other Ambulatory Visit (HOSPITAL_BASED_OUTPATIENT_CLINIC_OR_DEPARTMENT_OTHER): Payer: Medicare Other

## 2014-11-20 ENCOUNTER — Ambulatory Visit (HOSPITAL_BASED_OUTPATIENT_CLINIC_OR_DEPARTMENT_OTHER): Payer: Medicare Other

## 2014-11-20 VITALS — BP 164/74 | HR 56 | Temp 98.2°F | Resp 20

## 2014-11-20 DIAGNOSIS — Z5112 Encounter for antineoplastic immunotherapy: Secondary | ICD-10-CM | POA: Diagnosis present

## 2014-11-20 DIAGNOSIS — C9000 Multiple myeloma not having achieved remission: Secondary | ICD-10-CM

## 2014-11-20 DIAGNOSIS — C9002 Multiple myeloma in relapse: Secondary | ICD-10-CM

## 2014-11-20 LAB — CBC WITH DIFFERENTIAL/PLATELET
BASO%: 1.1 % (ref 0.0–2.0)
Basophils Absolute: 0 10*3/uL (ref 0.0–0.1)
EOS%: 5.9 % (ref 0.0–7.0)
Eosinophils Absolute: 0.2 10*3/uL (ref 0.0–0.5)
HCT: 29.5 % — ABNORMAL LOW (ref 38.4–49.9)
HGB: 9.2 g/dL — ABNORMAL LOW (ref 13.0–17.1)
LYMPH%: 13.9 % — AB (ref 14.0–49.0)
MCH: 25.3 pg — ABNORMAL LOW (ref 27.2–33.4)
MCHC: 31.4 g/dL — AB (ref 32.0–36.0)
MCV: 80.6 fL (ref 79.3–98.0)
MONO#: 0.5 10*3/uL (ref 0.1–0.9)
MONO%: 12.7 % (ref 0.0–14.0)
NEUT%: 66.4 % (ref 39.0–75.0)
NEUTROS ABS: 2.8 10*3/uL (ref 1.5–6.5)
PLATELETS: 119 10*3/uL — AB (ref 140–400)
RBC: 3.65 10*6/uL — AB (ref 4.20–5.82)
RDW: 15.4 % — ABNORMAL HIGH (ref 11.0–14.6)
WBC: 4.2 10*3/uL (ref 4.0–10.3)
lymph#: 0.6 10*3/uL — ABNORMAL LOW (ref 0.9–3.3)

## 2014-11-20 LAB — COMPREHENSIVE METABOLIC PANEL (CC13)
ALT: 12 U/L (ref 0–55)
ANION GAP: 6 meq/L (ref 3–11)
AST: 10 U/L (ref 5–34)
Albumin: 3.6 g/dL (ref 3.5–5.0)
Alkaline Phosphatase: 37 U/L — ABNORMAL LOW (ref 40–150)
BILIRUBIN TOTAL: 1.91 mg/dL — AB (ref 0.20–1.20)
BUN: 10.5 mg/dL (ref 7.0–26.0)
CHLORIDE: 109 meq/L (ref 98–109)
CO2: 24 meq/L (ref 22–29)
Calcium: 9.1 mg/dL (ref 8.4–10.4)
Creatinine: 1 mg/dL (ref 0.7–1.3)
EGFR: >90 mL/min/{1.73_m2} (ref >90)
GLUCOSE: 135 mg/dL (ref 70–140)
Potassium: 3.9 mEq/L (ref 3.5–5.1)
SODIUM: 139 meq/L (ref 136–145)
TOTAL PROTEIN: 5.5 g/dL — AB (ref 6.4–8.3)

## 2014-11-20 MED ORDER — SODIUM CHLORIDE 0.9 % IV SOLN: Freq: Once | INTRAVENOUS | Status: DC

## 2014-11-20 MED ORDER — SODIUM CHLORIDE 0.9 % IJ SOLN
10.0000 mL | INTRAMUSCULAR | Status: DC | PRN
  Administered 2014-11-20: 10 mL
  Filled 2014-11-20: qty 10

## 2014-11-20 MED ORDER — SODIUM CHLORIDE 0.9 % IV SOLN
Freq: Once | INTRAVENOUS | Status: AC
  Administered 2014-11-20: 10:00:00 via INTRAVENOUS

## 2014-11-20 MED ORDER — DEXTROSE 5 % IV SOLN
36.0000 mg/m2 | Freq: Once | INTRAVENOUS | Status: AC
  Administered 2014-11-20: 72 mg via INTRAVENOUS
  Filled 2014-11-20: qty 36

## 2014-11-20 MED ORDER — DEXAMETHASONE SODIUM PHOSPHATE 100 MG/10ML IJ SOLN
Freq: Once | INTRAMUSCULAR | Status: AC
  Administered 2014-11-20: 11:00:00 via INTRAVENOUS
  Filled 2014-11-20: qty 4

## 2014-11-20 MED ORDER — HEPARIN SOD (PORK) LOCK FLUSH 100 UNIT/ML IV SOLN
500.0000 [IU] | Freq: Once | INTRAVENOUS | Status: AC | PRN
  Administered 2014-11-20: 500 [IU]
  Filled 2014-11-20: qty 5

## 2014-11-20 NOTE — Patient Instructions (Signed)
[facility] Discharge Instructions for Patients Receiving Chemotherapy  Today you received the following chemotherapy agents: Kyprolis   To help prevent nausea and vomiting after your treatment, we encourage you to take your nausea medication as directed.    If you develop nausea and vomiting that is not controlled by your nausea medication, call the clinic.   BELOW ARE SYMPTOMS THAT SHOULD BE REPORTED IMMEDIATELY:  *FEVER GREATER THAN 100.5 F  *CHILLS WITH OR WITHOUT FEVER  NAUSEA AND VOMITING THAT IS NOT CONTROLLED WITH YOUR NAUSEA MEDICATION  *UNUSUAL SHORTNESS OF BREATH  *UNUSUAL BRUISING OR BLEEDING  TENDERNESS IN MOUTH AND THROAT WITH OR WITHOUT PRESENCE OF ULCERS  *URINARY PROBLEMS  *BOWEL PROBLEMS  UNUSUAL RASH Items with * indicate a potential emergency and should be followed up as soon as possible.  Feel free to call the clinic you have any questions or concerns. The clinic phone number is [phone].  Please show the CHEMO ALERT CARD at check-in to the Emergency Department and triage nurse.   

## 2014-11-21 ENCOUNTER — Ambulatory Visit (HOSPITAL_BASED_OUTPATIENT_CLINIC_OR_DEPARTMENT_OTHER): Payer: Medicare Other

## 2014-11-21 DIAGNOSIS — C9002 Multiple myeloma in relapse: Secondary | ICD-10-CM | POA: Diagnosis not present

## 2014-11-21 DIAGNOSIS — C9000 Multiple myeloma not having achieved remission: Secondary | ICD-10-CM

## 2014-11-21 DIAGNOSIS — Z5112 Encounter for antineoplastic immunotherapy: Secondary | ICD-10-CM | POA: Diagnosis present

## 2014-11-21 MED ORDER — SODIUM CHLORIDE 0.9 % IV SOLN
Freq: Once | INTRAVENOUS | Status: DC
Start: 2014-11-21 — End: 2014-11-21

## 2014-11-21 MED ORDER — SODIUM CHLORIDE 0.9 % IV SOLN
Freq: Once | INTRAVENOUS | Status: AC
  Administered 2014-11-21: 10:00:00 via INTRAVENOUS

## 2014-11-21 MED ORDER — SODIUM CHLORIDE 0.9 % IJ SOLN
10.0000 mL | INTRAMUSCULAR | Status: DC | PRN
  Administered 2014-11-21: 10 mL
  Filled 2014-11-21: qty 10

## 2014-11-21 MED ORDER — HEPARIN SOD (PORK) LOCK FLUSH 100 UNIT/ML IV SOLN
500.0000 [IU] | Freq: Once | INTRAVENOUS | Status: AC | PRN
  Administered 2014-11-21: 500 [IU]
  Filled 2014-11-21: qty 5

## 2014-11-21 MED ORDER — DEXTROSE 5 % IV SOLN
36.0000 mg/m2 | Freq: Once | INTRAVENOUS | Status: AC
  Administered 2014-11-21: 72 mg via INTRAVENOUS
  Filled 2014-11-21: qty 36

## 2014-11-21 MED ORDER — SODIUM CHLORIDE 0.9 % IV SOLN
Freq: Once | INTRAVENOUS | Status: AC
  Administered 2014-11-21: 10:00:00 via INTRAVENOUS
  Filled 2014-11-21: qty 4

## 2014-11-21 NOTE — Patient Instructions (Signed)
[facility] Discharge Instructions for Patients Receiving Chemotherapy  Today you received the following chemotherapy agents: Kyprolis   To help prevent nausea and vomiting after your treatment, we encourage you to take your nausea medication as directed.    If you develop nausea and vomiting that is not controlled by your nausea medication, call the clinic.   BELOW ARE SYMPTOMS THAT SHOULD BE REPORTED IMMEDIATELY:  *FEVER GREATER THAN 100.5 F  *CHILLS WITH OR WITHOUT FEVER  NAUSEA AND VOMITING THAT IS NOT CONTROLLED WITH YOUR NAUSEA MEDICATION  *UNUSUAL SHORTNESS OF BREATH  *UNUSUAL BRUISING OR BLEEDING  TENDERNESS IN MOUTH AND THROAT WITH OR WITHOUT PRESENCE OF ULCERS  *URINARY PROBLEMS  *BOWEL PROBLEMS  UNUSUAL RASH Items with * indicate a potential emergency and should be followed up as soon as possible.  Feel free to call the clinic you have any questions or concerns. The clinic phone number is [phone].  Please show the CHEMO ALERT CARD at check-in to the Emergency Department and triage nurse.   

## 2014-11-22 ENCOUNTER — Other Ambulatory Visit: Payer: Self-pay | Admitting: Gastroenterology

## 2014-11-27 ENCOUNTER — Other Ambulatory Visit: Payer: Self-pay | Admitting: Gastroenterology

## 2014-11-28 ENCOUNTER — Other Ambulatory Visit: Payer: Self-pay | Admitting: *Deleted

## 2014-11-28 DIAGNOSIS — C9000 Multiple myeloma not having achieved remission: Secondary | ICD-10-CM

## 2014-11-28 MED ORDER — POMALIDOMIDE 4 MG PO CAPS: 4.0000 mg | ORAL_CAPSULE | Freq: Every day | ORAL | Status: DC

## 2014-12-04 ENCOUNTER — Ambulatory Visit (HOSPITAL_BASED_OUTPATIENT_CLINIC_OR_DEPARTMENT_OTHER): Payer: Medicare Other

## 2014-12-04 ENCOUNTER — Ambulatory Visit (HOSPITAL_BASED_OUTPATIENT_CLINIC_OR_DEPARTMENT_OTHER): Payer: Medicare Other | Admitting: Internal Medicine

## 2014-12-04 ENCOUNTER — Encounter: Payer: Self-pay | Admitting: Internal Medicine

## 2014-12-04 ENCOUNTER — Telehealth: Payer: Self-pay | Admitting: *Deleted

## 2014-12-04 ENCOUNTER — Other Ambulatory Visit (HOSPITAL_BASED_OUTPATIENT_CLINIC_OR_DEPARTMENT_OTHER): Payer: Medicare Other

## 2014-12-04 ENCOUNTER — Telehealth: Payer: Self-pay | Admitting: Internal Medicine

## 2014-12-04 VITALS — BP 143/62 | HR 64 | Temp 98.3°F | Resp 17 | Ht 73.0 in | Wt 174.6 lb

## 2014-12-04 DIAGNOSIS — C9002 Multiple myeloma in relapse: Secondary | ICD-10-CM

## 2014-12-04 DIAGNOSIS — D61818 Other pancytopenia: Secondary | ICD-10-CM

## 2014-12-04 DIAGNOSIS — C9000 Multiple myeloma not having achieved remission: Secondary | ICD-10-CM

## 2014-12-04 DIAGNOSIS — Z5112 Encounter for antineoplastic immunotherapy: Secondary | ICD-10-CM

## 2014-12-04 DIAGNOSIS — I82409 Acute embolism and thrombosis of unspecified deep veins of unspecified lower extremity: Secondary | ICD-10-CM | POA: Diagnosis not present

## 2014-12-04 DIAGNOSIS — I82402 Acute embolism and thrombosis of unspecified deep veins of left lower extremity: Secondary | ICD-10-CM

## 2014-12-04 LAB — COMPREHENSIVE METABOLIC PANEL (CC13)
ALT: 12 U/L (ref 0–55)
AST: 12 U/L (ref 5–34)
Albumin: 3.9 g/dL (ref 3.5–5.0)
Alkaline Phosphatase: 36 U/L — ABNORMAL LOW (ref 40–150)
Anion Gap: 9 mEq/L (ref 3–11)
BUN: 12.6 mg/dL (ref 7.0–26.0)
CO2: 23 meq/L (ref 22–29)
Calcium: 9.2 mg/dL (ref 8.4–10.4)
Chloride: 109 mEq/L (ref 98–109)
Creatinine: 1 mg/dL (ref 0.7–1.3)
EGFR: 85 mL/min/{1.73_m2} — AB (ref >90)
GLUCOSE: 126 mg/dL (ref 70–140)
POTASSIUM: 4.2 meq/L (ref 3.5–5.1)
SODIUM: 140 meq/L (ref 136–145)
Total Bilirubin: 1.99 mg/dL — ABNORMAL HIGH (ref 0.20–1.20)
Total Protein: 5.8 g/dL — ABNORMAL LOW (ref 6.4–8.3)

## 2014-12-04 LAB — LACTATE DEHYDROGENASE (CC13): LDH: 217 U/L (ref 125–245)

## 2014-12-04 LAB — CBC WITH DIFFERENTIAL/PLATELET
BASO%: 0.3 % (ref 0.0–2.0)
BASOS ABS: 0 10*3/uL (ref 0.0–0.1)
EOS ABS: 0 10*3/uL (ref 0.0–0.5)
EOS%: 0.6 % (ref 0.0–7.0)
HCT: 32.3 % — ABNORMAL LOW (ref 38.4–49.9)
HGB: 9.9 g/dL — ABNORMAL LOW (ref 13.0–17.1)
LYMPH%: 20 % (ref 14.0–49.0)
MCH: 25 pg — AB (ref 27.2–33.4)
MCHC: 30.7 g/dL — ABNORMAL LOW (ref 32.0–36.0)
MCV: 81.6 fL (ref 79.3–98.0)
MONO#: 0.3 10*3/uL (ref 0.1–0.9)
MONO%: 8.7 % (ref 0.0–14.0)
NEUT%: 70.4 % (ref 39.0–75.0)
NEUTROS ABS: 2.2 10*3/uL (ref 1.5–6.5)
Platelets: 211 10*3/uL (ref 140–400)
RBC: 3.96 10*6/uL — AB (ref 4.20–5.82)
RDW: 16.5 % — ABNORMAL HIGH (ref 11.0–14.6)
WBC: 3.1 10*3/uL — AB (ref 4.0–10.3)
lymph#: 0.6 10*3/uL — ABNORMAL LOW (ref 0.9–3.3)

## 2014-12-04 MED ORDER — HEPARIN SOD (PORK) LOCK FLUSH 100 UNIT/ML IV SOLN
500.0000 [IU] | Freq: Once | INTRAVENOUS | Status: AC | PRN
  Administered 2014-12-04: 500 [IU]
  Filled 2014-12-04: qty 5

## 2014-12-04 MED ORDER — SODIUM CHLORIDE 0.9 % IV SOLN
Freq: Once | INTRAVENOUS | Status: AC
  Administered 2014-12-04: 11:00:00 via INTRAVENOUS

## 2014-12-04 MED ORDER — SODIUM CHLORIDE 0.9 % IJ SOLN
10.0000 mL | INTRAMUSCULAR | Status: DC | PRN
  Administered 2014-12-04: 10 mL
  Filled 2014-12-04: qty 10

## 2014-12-04 MED ORDER — DEXTROSE 5 % IV SOLN
36.0000 mg/m2 | Freq: Once | INTRAVENOUS | Status: AC
  Administered 2014-12-04: 72 mg via INTRAVENOUS
  Filled 2014-12-04: qty 36

## 2014-12-04 MED ORDER — DEXAMETHASONE SODIUM PHOSPHATE 100 MG/10ML IJ SOLN
Freq: Once | INTRAMUSCULAR | Status: AC
  Administered 2014-12-04: 11:00:00 via INTRAVENOUS
  Filled 2014-12-04: qty 4

## 2014-12-04 NOTE — Patient Instructions (Signed)
[facility] Discharge Instructions for Patients Receiving Chemotherapy  Today you received the following chemotherapy agents Kyprolis.  To help prevent nausea and vomiting after your treatment, we encourage you to take your nausea medication as prescribed.   If you develop nausea and vomiting that is not controlled by your nausea medication, call the clinic.   BELOW ARE SYMPTOMS THAT SHOULD BE REPORTED IMMEDIATELY:  *FEVER GREATER THAN 100.5 F  *CHILLS WITH OR WITHOUT FEVER  NAUSEA AND VOMITING THAT IS NOT CONTROLLED WITH YOUR NAUSEA MEDICATION  *UNUSUAL SHORTNESS OF BREATH  *UNUSUAL BRUISING OR BLEEDING  TENDERNESS IN MOUTH AND THROAT WITH OR WITHOUT PRESENCE OF ULCERS  *URINARY PROBLEMS  *BOWEL PROBLEMS  UNUSUAL RASH Items with * indicate a potential emergency and should be followed up as soon as possible.  Feel free to call the clinic you have any questions or concerns. The clinic phone number is [phone].  Please show the CHEMO ALERT CARD at check-in to the Emergency Department and triage nurse.   

## 2014-12-04 NOTE — Telephone Encounter (Signed)
Per staff message and POF I have scheduled appts. Advised scheduler of appts. JMW  

## 2014-12-04 NOTE — Telephone Encounter (Signed)
per pof to sch pt appt gave pt copy of avs-sent MAW email to sch pt trmt*pt aware

## 2014-12-04 NOTE — Progress Notes (Signed)
[place] [phone]   [phone]) [phone]  OFFICE PROGRESS NOTE  [name] [address] 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  2) the venous thrombosis diagnosed in June 2016  PRIOR THERAPY: :  1. Status post 6 cycles of systemic chemotherapy with Revlimid and Decadron, last dose was given 07/21/2010 with very good response. 2. 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 [name].  3. maintenance Revlimid at 10 mg by mouth daily status post 2 months. Therapy began 01/18/2011. 4. maintenance Revlimid at 15 mg by mouth daily with prophylactic dose Coumadin at 2 mg by mouth daily. 5. Systemic chemotherapy with Velcade at 1.3 mg per meter squared given on days 1, 4, 8 and 11 and Doxil at 30 mg per meter square given on day 4 and Decadron 40 mg by mouth on weekly basis given every 3 weeks. Status post 4 cycles. 6. Zometa 4 mg IV every 4 weeks.  7. Velcade 1.3 mg/M2 subcutaneous daily on a weekly basis with Decadron 20 mg by mouth on a weekly basis. First cycle expected on 06/21/2012. S/P 4 cycles. 8. Systemic chemotherapy with Carfilzomib, cyclophosphamide and Decadron. First cycle started on 08/09/2012. He is status post 19 cycles.   CURRENT THERAPY:  1) Systemic chemotherapy with Carfilzomib 36 MG/M2 on days 1, 2, 8, 9, 15 and 16 every 4 weeks, Pomalyst 4 mg by mouth daily for 21 days every 4 weeks and Decadron 40 mg by mouth weekly. First cycle started on 04/10/2014. Status post 7 cycles. 2) Zometa every 8 weeks.  3) Xarelto 20 mg by mouth daily, started 07/17/2014.   INTERVAL HISTORY: Eddie Thomas 69 y.o. male returns to the clinic today for followup visit. He is currently on treatment with Carfilzomib, Pomalyst and dexamethasone status post 8 cycle and tolerating his treatment fairly well except for  mild pancytopenia. He was seen recently by his cardiologist at [facility] and was given good reports was follow-up in one year. He continues to have mild swelling in the left lower extremity and he is currently on treatment with Xarelto. He denied having any significant weight loss or night sweats. He has no fever or chills. He has no nausea or vomiting. The patient denied having any significant chest pain, shortness of breath, cough or hemoptysis. He is here today to start cycle #9.  MEDICAL HISTORY: Past Medical History  Diagnosis Date  . Multiple myeloma   . Diabetes mellitus ([place]) 07/03/2011  . Hypertension 07/03/2011  . Hyperlipidemia 07/03/2011  . Colon polyps 2012  . Gastric ulcer     ALLERGIES:  has No Known Allergies.  MEDICATIONS:  Current Outpatient Prescriptions  Medication Sig Dispense Refill  . Cholecalciferol (VITAMIN D-3) 5000 UNITS TABS Take 1 tablet by mouth daily.    [name] dexamethasone (DECADRON) 4 MG tablet TAKE FIVE TABLETS BY MOUTH EVERY WEEK START WITH THE FIRST DOSE OF CHEMOTHERAPY 80 tablet 0  . fluconazole (DIFLUCAN) 100 MG tablet Take 1 tablet (100 mg total) by mouth daily. 7 tablet 0  . lidocaine-prilocaine (EMLA) cream Apply 1 application topically as needed. Apply one hour before use and cover with plastic wrap. 30 g 1  . lisinopril (PRINIVIL,ZESTRIL) 10 MG tablet Take 5 mg by mouth every evening.     [name] oxyCODONE-acetaminophen (PERCOCET/ROXICET) 5-325 MG per tablet Take 1 tablet by  mouth every 6 (six) hours as needed for severe pain. 30 tablet 0  . pantoprazole (PROTONIX) 40 MG tablet TAKE ONE TABLET BY MOUTH TWICE DAILY 60 tablet 0  . pioglitazone (ACTOS) 15 MG tablet Take 15 mg by mouth daily.    . pomalidomide (POMALYST) 4 MG capsule Take 1 capsule (4 mg total) by mouth daily. Take with water on days 1-21. Repeat every 28 days. 21 capsule 0  . prochlorperazine (COMPAZINE) 10 MG tablet Take 10 mg by mouth every 6 (six) hours as needed for nausea or vomiting.    .  rivaroxaban (XARELTO) 20 MG TABS tablet Take 1 tablet (20 mg total) by mouth daily with supper. 30 tablet 1  . simvastatin (ZOCOR) 10 MG tablet Take 10 mg by mouth at bedtime.      . temazepam (RESTORIL) 15 MG capsule Take 15 mg by mouth at bedtime as needed for sleep.    . valACYclovir (VALTREX) 500 MG tablet TAKE ONE CAPLET BY MOUTH ONCE DAILY 30 tablet 3  . VIAGRA 50 MG tablet Take 50 mg by mouth daily as needed for erectile dysfunction.      No current facility-administered medications for this visit.    SURGICAL HISTORY:  Past Surgical History  Procedure Laterality Date  . Limbal stem cell transplant    . Humerus fracture surgery      right  . 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    REVIEW OF SYSTEMS:  A comprehensive review of systems was negative except for: Constitutional: positive for fatigue   PHYSICAL EXAMINATION: General appearance: alert, cooperative and no distress Head: Normocephalic, without obvious abnormality, atraumatic Neck: no adenopathy, no JVD, supple, symmetrical, trachea midline and thyroid not enlarged, symmetric, no tenderness/mass/nodules Lymph nodes: Cervical, supraclavicular, and axillary nodes normal. Resp: clear to auscultation bilaterally Back: symmetric, no curvature. ROM normal. No CVA tenderness. Cardio: regular rate and rhythm, S1, S2 normal, no murmur, click, rub or gallop GI: soft, non-tender; bowel sounds normal; no masses,  no organomegaly Extremities: extremities normal, atraumatic, no cyanosis or edema Neurologic: Alert and oriented X 3, normal strength and tone. Normal symmetric reflexes. Normal coordination and gait  ECOG PERFORMANCE STATUS: 1 - Symptomatic but completely ambulatory  Blood pressure 143/62, pulse 64, temperature 98.3 F (36.8 C), temperature source Oral, resp. rate 17, height _0  (1.854 m), weight 174 lb 9.6 oz (79.198 kg), SpO2 100 %.  LABORATORY DATA: Lab Results  Component Value Date   WBC  3.1* 12/04/2014   HGB 9.9* 12/04/2014   HCT 32.3* 12/04/2014   MCV 81.6 12/04/2014   PLT 211 12/04/2014      Chemistry      Component Value Date/Time   NA 140 12/04/2014 0921   NA 139 08/30/2013 0935   K 4.2 12/04/2014 0921   K 4.1 08/30/2013 0935   CL 101 08/30/2013 0935   CL 103 07/12/2012 0907   CO2 23 12/04/2014 0921   CO2 26 08/30/2013 0935   BUN 12.6 12/04/2014 0921   BUN 11 08/30/2013 0935   CREATININE 1.0 12/04/2014 0921   CREATININE 1.03 08/30/2013 0935      Component Value Date/Time   CALCIUM 9.2 12/04/2014 0921   CALCIUM 9.2 08/30/2013 0935   ALKPHOS 36* 12/04/2014 0921   ALKPHOS 39 08/30/2013 0935   AST 12 12/04/2014 0921   AST 13 08/30/2013 0935   ALT 12 12/04/2014 0921   ALT 9 08/30/2013 0935   BILITOT 1.99* 12/04/2014  9290   BILITOT 1.3* 08/30/2013 0935     Other lab results:   RADIOGRAPHIC STUDIES: No results found.  ASSESSMENT AND PLAN: This is a very pleasant 69 years old [place] American male with history of multiple myeloma currently undergoing systemic chemotherapy with Carfilzomib, cyclophosphamide and Decadron status post 19 cycles. This was discontinued secondary to disease progression. The patient was started on systemic chemotherapy with Carfilzomib, Pomalyst and Decadron status post 8 cycles. He is tolerating his treatment fairly well except for mild pancytopenia We will proceed with cycle #9 today as a scheduled. The patient would come back for follow-up visit in 4 weeks before starting cycle #9 for evaluation after repeating myeloma panel. He'll continue on Zometa every 2 months. For the venous thrombosis, he will continue on Xarelto 20 mg by mouth daily. He was advised to call immediately if he has any concerning symptoms in the interval.  The patient voices understanding of current disease status and treatment options and is in agreement with the current care plan. All questions were answered. The patient knows to call the clinic with  any problems, questions or concerns. We can certainly see the patient much sooner if necessary.  Disclaimer: This note was dictated with voice recognition software. Similar sounding words can inadvertently be transcribed and may not be corrected upon review.

## 2014-12-05 ENCOUNTER — Ambulatory Visit (HOSPITAL_BASED_OUTPATIENT_CLINIC_OR_DEPARTMENT_OTHER): Payer: Medicare Other

## 2014-12-05 ENCOUNTER — Other Ambulatory Visit: Payer: Self-pay | Admitting: Gastroenterology

## 2014-12-05 VITALS — BP 129/65 | HR 60 | Temp 98.2°F | Resp 20

## 2014-12-05 DIAGNOSIS — C9002 Multiple myeloma in relapse: Secondary | ICD-10-CM

## 2014-12-05 DIAGNOSIS — C9000 Multiple myeloma not having achieved remission: Secondary | ICD-10-CM

## 2014-12-05 DIAGNOSIS — Z5112 Encounter for antineoplastic immunotherapy: Secondary | ICD-10-CM

## 2014-12-05 MED ORDER — HEPARIN SOD (PORK) LOCK FLUSH 100 UNIT/ML IV SOLN
500.0000 [IU] | Freq: Once | INTRAVENOUS | Status: AC | PRN
  Administered 2014-12-05: 500 [IU]
  Filled 2014-12-05: qty 5

## 2014-12-05 MED ORDER — DEXTROSE 5 % IV SOLN
36.0000 mg/m2 | Freq: Once | INTRAVENOUS | Status: AC
  Administered 2014-12-05: 72 mg via INTRAVENOUS
  Filled 2014-12-05: qty 36

## 2014-12-05 MED ORDER — SODIUM CHLORIDE 0.9 % IV SOLN
Freq: Once | INTRAVENOUS | Status: AC
  Administered 2014-12-05: 09:00:00 via INTRAVENOUS

## 2014-12-05 MED ORDER — SODIUM CHLORIDE 0.9 % IV SOLN
Freq: Once | INTRAVENOUS | Status: AC
  Administered 2014-12-05: 09:00:00 via INTRAVENOUS
  Filled 2014-12-05: qty 4

## 2014-12-05 MED ORDER — SODIUM CHLORIDE 0.9 % IJ SOLN
10.0000 mL | INTRAMUSCULAR | Status: DC | PRN
  Administered 2014-12-05: 10 mL
  Filled 2014-12-05: qty 10

## 2014-12-05 NOTE — Progress Notes (Signed)
Patient declines extra hydration fluids

## 2014-12-05 NOTE — Patient Instructions (Signed)
[facility] Discharge Instructions for Patients Receiving Chemotherapy  Today you received the following chemotherapy agents Kyprolis.  To help prevent nausea and vomiting after your treatment, we encourage you to take your nausea medication as prescribed.   If you develop nausea and vomiting that is not controlled by your nausea medication, call the clinic.   BELOW ARE SYMPTOMS THAT SHOULD BE REPORTED IMMEDIATELY:  *FEVER GREATER THAN 100.5 F  *CHILLS WITH OR WITHOUT FEVER  NAUSEA AND VOMITING THAT IS NOT CONTROLLED WITH YOUR NAUSEA MEDICATION  *UNUSUAL SHORTNESS OF BREATH  *UNUSUAL BRUISING OR BLEEDING  TENDERNESS IN MOUTH AND THROAT WITH OR WITHOUT PRESENCE OF ULCERS  *URINARY PROBLEMS  *BOWEL PROBLEMS  UNUSUAL RASH Items with * indicate a potential emergency and should be followed up as soon as possible.  Feel free to call the clinic you have any questions or concerns. The clinic phone number is [phone].  Please show the CHEMO ALERT CARD at check-in to the Emergency Department and triage nurse.   

## 2014-12-08 ENCOUNTER — Other Ambulatory Visit: Payer: Self-pay | Admitting: Medical Oncology

## 2014-12-08 DIAGNOSIS — C9000 Multiple myeloma not having achieved remission: Secondary | ICD-10-CM

## 2014-12-11 ENCOUNTER — Other Ambulatory Visit (HOSPITAL_BASED_OUTPATIENT_CLINIC_OR_DEPARTMENT_OTHER): Payer: Medicare Other

## 2014-12-11 ENCOUNTER — Ambulatory Visit (HOSPITAL_BASED_OUTPATIENT_CLINIC_OR_DEPARTMENT_OTHER): Payer: Medicare Other

## 2014-12-11 VITALS — BP 150/74 | HR 58 | Temp 97.9°F | Resp 16

## 2014-12-11 DIAGNOSIS — C9002 Multiple myeloma in relapse: Secondary | ICD-10-CM

## 2014-12-11 DIAGNOSIS — Z5112 Encounter for antineoplastic immunotherapy: Secondary | ICD-10-CM | POA: Diagnosis present

## 2014-12-11 DIAGNOSIS — C9000 Multiple myeloma not having achieved remission: Secondary | ICD-10-CM

## 2014-12-11 LAB — CBC WITH DIFFERENTIAL/PLATELET
BASO%: 0.3 % (ref 0.0–2.0)
Basophils Absolute: 0 10*3/uL (ref 0.0–0.1)
EOS ABS: 0.1 10*3/uL (ref 0.0–0.5)
EOS%: 1.7 % (ref 0.0–7.0)
HCT: 30.4 % — ABNORMAL LOW (ref 38.4–49.9)
HEMOGLOBIN: 9.4 g/dL — AB (ref 13.0–17.1)
LYMPH%: 20.3 % (ref 14.0–49.0)
MCH: 25 pg — ABNORMAL LOW (ref 27.2–33.4)
MCHC: 30.9 g/dL — ABNORMAL LOW (ref 32.0–36.0)
MCV: 80.9 fL (ref 79.3–98.0)
MONO#: 0.1 10*3/uL (ref 0.1–0.9)
MONO%: 4.2 % (ref 0.0–14.0)
NEUT%: 73.5 % (ref 39.0–75.0)
NEUTROS ABS: 2.6 10*3/uL (ref 1.5–6.5)
PLATELETS: 113 10*3/uL — AB (ref 140–400)
RBC: 3.76 10*6/uL — ABNORMAL LOW (ref 4.20–5.82)
RDW: 16.7 % — AB (ref 11.0–14.6)
WBC: 3.5 10*3/uL — AB (ref 4.0–10.3)
lymph#: 0.7 10*3/uL — ABNORMAL LOW (ref 0.9–3.3)

## 2014-12-11 LAB — COMPREHENSIVE METABOLIC PANEL (CC13)
ALBUMIN: 3.7 g/dL (ref 3.5–5.0)
ALK PHOS: 34 U/L — AB (ref 40–150)
ALT: 9 U/L (ref 0–55)
AST: 10 U/L (ref 5–34)
Anion Gap: 9 mEq/L (ref 3–11)
BILIRUBIN TOTAL: 2.32 mg/dL — AB (ref 0.20–1.20)
BUN: 12.7 mg/dL (ref 7.0–26.0)
CO2: 23 mEq/L (ref 22–29)
CREATININE: 1 mg/dL (ref 0.7–1.3)
Calcium: 9.1 mg/dL (ref 8.4–10.4)
Chloride: 107 mEq/L (ref 98–109)
GLUCOSE: 116 mg/dL (ref 70–140)
Potassium: 3.9 mEq/L (ref 3.5–5.1)
SODIUM: 139 meq/L (ref 136–145)
TOTAL PROTEIN: 5.5 g/dL — AB (ref 6.4–8.3)

## 2014-12-11 MED ORDER — SODIUM CHLORIDE 0.9 % IV SOLN
Freq: Once | INTRAVENOUS | Status: AC
  Administered 2014-12-11: 10:00:00 via INTRAVENOUS

## 2014-12-11 MED ORDER — DEXTROSE 5 % IV SOLN
36.0000 mg/m2 | Freq: Once | INTRAVENOUS | Status: AC
  Administered 2014-12-11: 72 mg via INTRAVENOUS
  Filled 2014-12-11: qty 36

## 2014-12-11 MED ORDER — HEPARIN SOD (PORK) LOCK FLUSH 100 UNIT/ML IV SOLN
500.0000 [IU] | Freq: Once | INTRAVENOUS | Status: AC | PRN
  Administered 2014-12-11: 500 [IU]
  Filled 2014-12-11: qty 5

## 2014-12-11 MED ORDER — SODIUM CHLORIDE 0.9 % IV SOLN
Freq: Once | INTRAVENOUS | Status: AC
  Administered 2014-12-11: 10:00:00 via INTRAVENOUS
  Filled 2014-12-11: qty 4

## 2014-12-11 MED ORDER — SODIUM CHLORIDE 0.9 % IJ SOLN
10.0000 mL | INTRAMUSCULAR | Status: DC | PRN
  Administered 2014-12-11: 10 mL
  Filled 2014-12-11: qty 10

## 2014-12-11 NOTE — Patient Instructions (Signed)
[facility] Discharge Instructions for Patients Receiving Chemotherapy  Today you received the following chemotherapy agents kyprolis  To help prevent nausea and vomiting after your treatment, we encourage you to take your nausea medication.   If you develop nausea and vomiting that is not controlled by your nausea medication, call the clinic.   BELOW ARE SYMPTOMS THAT SHOULD BE REPORTED IMMEDIATELY:  *FEVER GREATER THAN 100.5 F  *CHILLS WITH OR WITHOUT FEVER  NAUSEA AND VOMITING THAT IS NOT CONTROLLED WITH YOUR NAUSEA MEDICATION  *UNUSUAL SHORTNESS OF BREATH  *UNUSUAL BRUISING OR BLEEDING  TENDERNESS IN MOUTH AND THROAT WITH OR WITHOUT PRESENCE OF ULCERS  *URINARY PROBLEMS  *BOWEL PROBLEMS  UNUSUAL RASH Items with * indicate a potential emergency and should be followed up as soon as possible.  Feel free to call the clinic you have any questions or concerns. The clinic phone number is [phone].  Please show the CHEMO ALERT CARD at check-in to the Emergency Department and triage nurse.   

## 2014-12-11 NOTE — Progress Notes (Signed)
Spoke with Dr. Julien Nordmann about Bilirubin of 2.32, per MD okay to treat.

## 2014-12-12 ENCOUNTER — Ambulatory Visit (HOSPITAL_BASED_OUTPATIENT_CLINIC_OR_DEPARTMENT_OTHER): Payer: Medicare Other

## 2014-12-12 VITALS — BP 146/69 | HR 54 | Temp 97.5°F | Resp 16

## 2014-12-12 DIAGNOSIS — C9000 Multiple myeloma not having achieved remission: Secondary | ICD-10-CM

## 2014-12-12 DIAGNOSIS — Z5112 Encounter for antineoplastic immunotherapy: Secondary | ICD-10-CM | POA: Diagnosis present

## 2014-12-12 DIAGNOSIS — C9002 Multiple myeloma in relapse: Secondary | ICD-10-CM | POA: Diagnosis not present

## 2014-12-12 MED ORDER — SODIUM CHLORIDE 0.9 % IV SOLN
Freq: Once | INTRAVENOUS | Status: AC
  Administered 2014-12-12: 09:00:00 via INTRAVENOUS

## 2014-12-12 MED ORDER — DEXTROSE 5 % IV SOLN
36.0000 mg/m2 | Freq: Once | INTRAVENOUS | Status: AC
  Administered 2014-12-12: 72 mg via INTRAVENOUS
  Filled 2014-12-12: qty 36

## 2014-12-12 MED ORDER — DEXAMETHASONE SODIUM PHOSPHATE 100 MG/10ML IJ SOLN
Freq: Once | INTRAMUSCULAR | Status: AC
  Administered 2014-12-12: 09:00:00 via INTRAVENOUS
  Filled 2014-12-12: qty 4

## 2014-12-12 MED ORDER — SODIUM CHLORIDE 0.9 % IJ SOLN
10.0000 mL | INTRAMUSCULAR | Status: DC | PRN
  Administered 2014-12-12: 10 mL
  Filled 2014-12-12: qty 10

## 2014-12-12 MED ORDER — HEPARIN SOD (PORK) LOCK FLUSH 100 UNIT/ML IV SOLN
500.0000 [IU] | Freq: Once | INTRAVENOUS | Status: AC | PRN
  Administered 2014-12-12: 500 [IU]
  Filled 2014-12-12: qty 5

## 2014-12-12 NOTE — Patient Instructions (Signed)
[facility] Discharge Instructions for Patients Receiving Chemotherapy  Today you received the following chemotherapy agents kyprolis  To help prevent nausea and vomiting after your treatment, we encourage you to take your nausea medication.   If you develop nausea and vomiting that is not controlled by your nausea medication, call the clinic.   BELOW ARE SYMPTOMS THAT SHOULD BE REPORTED IMMEDIATELY:  *FEVER GREATER THAN 100.5 F  *CHILLS WITH OR WITHOUT FEVER  NAUSEA AND VOMITING THAT IS NOT CONTROLLED WITH YOUR NAUSEA MEDICATION  *UNUSUAL SHORTNESS OF BREATH  *UNUSUAL BRUISING OR BLEEDING  TENDERNESS IN MOUTH AND THROAT WITH OR WITHOUT PRESENCE OF ULCERS  *URINARY PROBLEMS  *BOWEL PROBLEMS  UNUSUAL RASH Items with * indicate a potential emergency and should be followed up as soon as possible.  Feel free to call the clinic you have any questions or concerns. The clinic phone number is [phone].  Please show the CHEMO ALERT CARD at check-in to the Emergency Department and triage nurse.   

## 2014-12-15 ENCOUNTER — Other Ambulatory Visit: Payer: Self-pay | Admitting: Medical Oncology

## 2014-12-15 DIAGNOSIS — C9000 Multiple myeloma not having achieved remission: Secondary | ICD-10-CM

## 2014-12-18 ENCOUNTER — Telehealth: Payer: Self-pay | Admitting: *Deleted

## 2014-12-18 ENCOUNTER — Other Ambulatory Visit (HOSPITAL_BASED_OUTPATIENT_CLINIC_OR_DEPARTMENT_OTHER): Payer: Medicare Other

## 2014-12-18 ENCOUNTER — Other Ambulatory Visit: Payer: Self-pay | Admitting: Internal Medicine

## 2014-12-18 ENCOUNTER — Ambulatory Visit (HOSPITAL_BASED_OUTPATIENT_CLINIC_OR_DEPARTMENT_OTHER): Payer: Medicare Other

## 2014-12-18 VITALS — BP 148/83 | HR 59 | Temp 98.1°F | Resp 18

## 2014-12-18 DIAGNOSIS — C9002 Multiple myeloma in relapse: Secondary | ICD-10-CM | POA: Diagnosis present

## 2014-12-18 DIAGNOSIS — C9000 Multiple myeloma not having achieved remission: Secondary | ICD-10-CM

## 2014-12-18 LAB — CBC WITH DIFFERENTIAL/PLATELET
BASO%: 0.9 % (ref 0.0–2.0)
BASOS ABS: 0 10*3/uL (ref 0.0–0.1)
EOS ABS: 0.1 10*3/uL (ref 0.0–0.5)
EOS%: 3 % (ref 0.0–7.0)
HCT: 28.4 % — ABNORMAL LOW (ref 38.4–49.9)
HEMOGLOBIN: 8.8 g/dL — AB (ref 13.0–17.1)
LYMPH%: 19 % (ref 14.0–49.0)
MCH: 24.9 pg — AB (ref 27.2–33.4)
MCHC: 30.9 g/dL — AB (ref 32.0–36.0)
MCV: 80.8 fL (ref 79.3–98.0)
MONO#: 0.5 10*3/uL (ref 0.1–0.9)
MONO%: 12.3 % (ref 0.0–14.0)
NEUT#: 2.7 10*3/uL (ref 1.5–6.5)
NEUT%: 64.8 % (ref 39.0–75.0)
Platelets: 128 10*3/uL — ABNORMAL LOW (ref 140–400)
RBC: 3.52 10*6/uL — AB (ref 4.20–5.82)
RDW: 16.3 % — AB (ref 11.0–14.6)
WBC: 4.1 10*3/uL (ref 4.0–10.3)
lymph#: 0.8 10*3/uL — ABNORMAL LOW (ref 0.9–3.3)

## 2014-12-18 LAB — COMPREHENSIVE METABOLIC PANEL (CC13)
ALBUMIN: 3.5 g/dL (ref 3.5–5.0)
ALK PHOS: 31 U/L — AB (ref 40–150)
ALT: 9 U/L (ref 0–55)
AST: 11 U/L (ref 5–34)
Anion Gap: 7 mEq/L (ref 3–11)
BUN: 10.5 mg/dL (ref 7.0–26.0)
CHLORIDE: 109 meq/L (ref 98–109)
CO2: 24 mEq/L (ref 22–29)
Calcium: 9 mg/dL (ref 8.4–10.4)
Creatinine: 1 mg/dL (ref 0.7–1.3)
GLUCOSE: 126 mg/dL (ref 70–140)
POTASSIUM: 3.7 meq/L (ref 3.5–5.1)
SODIUM: 139 meq/L (ref 136–145)
Total Bilirubin: 2.83 mg/dL — ABNORMAL HIGH (ref 0.20–1.20)
Total Protein: 5.4 g/dL — ABNORMAL LOW (ref 6.4–8.3)

## 2014-12-18 MED ORDER — HEPARIN SOD (PORK) LOCK FLUSH 100 UNIT/ML IV SOLN
500.0000 [IU] | Freq: Once | INTRAVENOUS | Status: AC
  Administered 2014-12-18: 500 [IU] via INTRAVENOUS
  Filled 2014-12-18: qty 5

## 2014-12-18 NOTE — Patient Instructions (Signed)
Kyprolis Treatment held today due to increased Bilirubin.  Return in 2 weeks as Scheduled.  Dr. Worthy Flank nurse will call you to instruct on whether or not you should continue the Pomalyst at home.  Please call her if you do not hear anything by tomorrow morning.

## 2014-12-18 NOTE — Telephone Encounter (Signed)
Pt Treatment cancelled due to labs. Per MD i instructed pt to hold Pomalyst until DEC 12 when he comes back for next treatment. Per MD, pt is to be seen on this day as well.  POF to scheduling.

## 2014-12-18 NOTE — Progress Notes (Signed)
Notified Dr. Julien Nordmann of increased Bilirubin today.  He instructed to hold treatment w/ Kyprolis today and for pt to return in 2 weeks as scheduled.  Pt verbalized understanding.  He asks if he is to continue Pomalyst?  I left message w/ desk nurse, Stanton Kidney, who will ask Dr. Julien Nordmann and call him w/ instructions about Pomalyst.  Pt verbalized understanding and d/c'd w/o treatment today.

## 2014-12-19 ENCOUNTER — Ambulatory Visit: Payer: Medicare Other

## 2014-12-20 ENCOUNTER — Telehealth: Payer: Self-pay | Admitting: Internal Medicine

## 2014-12-20 NOTE — Telephone Encounter (Signed)
s.w. pt and advised on lab/MD and chemo appt

## 2014-12-26 ENCOUNTER — Other Ambulatory Visit: Payer: Self-pay | Admitting: Medical Oncology

## 2014-12-26 DIAGNOSIS — C9000 Multiple myeloma not having achieved remission: Secondary | ICD-10-CM

## 2014-12-26 MED ORDER — POMALIDOMIDE 4 MG PO CAPS: ORAL_CAPSULE | ORAL | Status: DC

## 2014-12-27 ENCOUNTER — Other Ambulatory Visit: Payer: Self-pay | Admitting: Medical Oncology

## 2014-12-27 DIAGNOSIS — C9000 Multiple myeloma not having achieved remission: Secondary | ICD-10-CM

## 2014-12-27 MED ORDER — RIVAROXABAN 20 MG PO TABS: 20.0000 mg | ORAL_TABLET | Freq: Every day | ORAL | Status: DC

## 2014-12-27 NOTE — Progress Notes (Signed)
xarelto rx sent to local pharmacy

## 2014-12-29 ENCOUNTER — Telehealth: Payer: Self-pay | Admitting: Physician Assistant

## 2014-12-29 ENCOUNTER — Encounter: Payer: Self-pay | Admitting: Physician Assistant

## 2014-12-29 ENCOUNTER — Ambulatory Visit (HOSPITAL_BASED_OUTPATIENT_CLINIC_OR_DEPARTMENT_OTHER): Payer: Medicare Other | Admitting: Physician Assistant

## 2014-12-29 ENCOUNTER — Other Ambulatory Visit (HOSPITAL_BASED_OUTPATIENT_CLINIC_OR_DEPARTMENT_OTHER): Payer: Medicare Other

## 2014-12-29 VITALS — BP 151/94 | HR 65 | Temp 98.0°F | Resp 17 | Ht 73.0 in | Wt 174.6 lb

## 2014-12-29 DIAGNOSIS — I82402 Acute embolism and thrombosis of unspecified deep veins of left lower extremity: Secondary | ICD-10-CM

## 2014-12-29 DIAGNOSIS — C9000 Multiple myeloma not having achieved remission: Secondary | ICD-10-CM | POA: Diagnosis present

## 2014-12-29 DIAGNOSIS — I82409 Acute embolism and thrombosis of unspecified deep veins of unspecified lower extremity: Secondary | ICD-10-CM

## 2014-12-29 DIAGNOSIS — D61818 Other pancytopenia: Secondary | ICD-10-CM

## 2014-12-29 DIAGNOSIS — R609 Edema, unspecified: Secondary | ICD-10-CM | POA: Diagnosis not present

## 2014-12-29 DIAGNOSIS — T451X5A Adverse effect of antineoplastic and immunosuppressive drugs, initial encounter: Secondary | ICD-10-CM

## 2014-12-29 DIAGNOSIS — D61810 Antineoplastic chemotherapy induced pancytopenia: Secondary | ICD-10-CM

## 2014-12-29 DIAGNOSIS — D6481 Anemia due to antineoplastic chemotherapy: Secondary | ICD-10-CM

## 2014-12-29 DIAGNOSIS — R945 Abnormal results of liver function studies: Secondary | ICD-10-CM | POA: Diagnosis not present

## 2014-12-29 DIAGNOSIS — I82890 Acute embolism and thrombosis of other specified veins: Secondary | ICD-10-CM | POA: Diagnosis not present

## 2014-12-29 DIAGNOSIS — R600 Localized edema: Secondary | ICD-10-CM

## 2014-12-29 LAB — COMPREHENSIVE METABOLIC PANEL
ALBUMIN: 3.9 g/dL (ref 3.5–5.0)
ALK PHOS: 42 U/L (ref 40–150)
ALT: 11 U/L (ref 0–55)
ANION GAP: 12 meq/L — AB (ref 3–11)
AST: 16 U/L (ref 5–34)
BUN: 12.2 mg/dL (ref 7.0–26.0)
CALCIUM: 9.3 mg/dL (ref 8.4–10.4)
CHLORIDE: 106 meq/L (ref 98–109)
CO2: 22 mEq/L (ref 22–29)
Creatinine: 1 mg/dL (ref 0.7–1.3)
EGFR: 84 mL/min/{1.73_m2} — AB (ref >90)
Glucose: 102 mg/dl (ref 70–140)
POTASSIUM: 4.5 meq/L (ref 3.5–5.1)
Sodium: 140 mEq/L (ref 136–145)
Total Bilirubin: 2.13 mg/dL — ABNORMAL HIGH (ref 0.20–1.20)
Total Protein: 6.3 g/dL — ABNORMAL LOW (ref 6.4–8.3)

## 2014-12-29 LAB — CBC WITH DIFFERENTIAL/PLATELET
BASO%: 0 % (ref 0.0–2.0)
BASOS ABS: 0 10*3/uL (ref 0.0–0.1)
EOS ABS: 0 10*3/uL (ref 0.0–0.5)
EOS%: 1 % (ref 0.0–7.0)
HEMATOCRIT: 32.8 % — AB (ref 38.4–49.9)
HEMOGLOBIN: 10.1 g/dL — AB (ref 13.0–17.1)
LYMPH%: 30.3 % (ref 14.0–49.0)
MCH: 25.3 pg — ABNORMAL LOW (ref 27.2–33.4)
MCHC: 30.8 g/dL — ABNORMAL LOW (ref 32.0–36.0)
MCV: 82 fL (ref 79.3–98.0)
MONO#: 0.9 10*3/uL (ref 0.1–0.9)
MONO%: 29.7 % — ABNORMAL HIGH (ref 0.0–14.0)
NEUT%: 39 % (ref 39.0–75.0)
NEUTROS ABS: 1.2 10*3/uL — AB (ref 1.5–6.5)
NRBC: 0 % (ref 0–0)
PLATELETS: 170 10*3/uL (ref 140–400)
RBC: 4 10*6/uL — ABNORMAL LOW (ref 4.20–5.82)
RDW: 16.6 % — AB (ref 11.0–14.6)
WBC: 3 10*3/uL — AB (ref 4.0–10.3)
lymph#: 0.9 10*3/uL (ref 0.9–3.3)

## 2014-12-29 LAB — LACTATE DEHYDROGENASE: LDH: 220 U/L (ref 125–245)

## 2014-12-29 NOTE — Progress Notes (Signed)
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  2) the venous thrombosis diagnosed in June 2016  PRIOR THERAPY: :  1. Status post 6 cycles of systemic chemotherapy with Revlimid and Decadron, last dose was given 07/21/2010 with very good response. 2. 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 Eddie Thomas.  3. maintenance Revlimid at 10 mg by mouth daily status post 2 months. Therapy began 01/18/2011. 4. maintenance Revlimid at 15 mg by mouth daily with prophylactic dose Coumadin at 2 mg by mouth daily. 5. Systemic chemotherapy with Velcade at 1.3 mg per meter squared given on days 1, 4, 8 and 11 and Doxil at 30 mg per meter square given on day 4 and Decadron 40 mg by mouth on weekly basis given every 3 weeks. Status post 4 cycles. 6. Zometa 4 mg IV every 4 weeks.  7. Velcade 1.3 mg/M2 subcutaneous daily on a weekly basis with Decadron 20 mg by mouth on a weekly basis. First cycle expected on 06/21/2012. S/P 4 cycles. 8. Systemic chemotherapy with Carfilzomib, cyclophosphamide and Decadron. First cycle started on 08/09/2012. He is status post 19 cycles.   CURRENT THERAPY:  1) Systemic chemotherapy with Carfilzomib 36 MG/M2 on days 1, 2, 8, 9, 15 and 16 every 4 weeks, Pomalyst 4 mg by mouth daily for 21 days every 4 weeks and Decadron 40 mg by mouth weekly. First cycle started on 04/10/2014. Status post 8 cycles. 2) Zometa every 8 weeks.  3) Xarelto 20 mg by mouth daily, started 07/17/2014.   INTERVAL HISTORY: Eddie Thomas 69 y.o. male returns to the clinic today for followup visit. He is currently on treatment with Carfilzomib, Pomalyst and dexamethasone status post 8 cycle and tolerating his treatment fairly well except for  mild pancytopenia and mildly abnormal liver functions. He continues to have mild swelling in the left lower extremity and he is currently on treatment with Xarelto. He denied having any significant weight loss or night sweats. He has no fever or chills. He has no nausea or vomiting. The patient denied having any significant chest pain, shortness of breath, cough or hemoptysis. He is here today to start cycle #9Plan for Monday, 01/01/2015.  MEDICAL HISTORY: Past Medical History  Diagnosis Date  . Multiple myeloma   . Diabetes mellitus ([place]) 07/03/2011  . Hypertension 07/03/2011  . Hyperlipidemia 07/03/2011  . Colon polyps 2012  . Gastric ulcer     ALLERGIES:  has No Known Allergies.  MEDICATIONS:  Current Outpatient Prescriptions  Medication Sig Dispense Refill  . gabapentin (NEURONTIN) 300 MG capsule Take 300 mg by mouth 3 (three) times daily.    . Cholecalciferol (VITAMIN D-3) 5000 UNITS TABS Take 1 tablet by mouth daily.    [name] dexamethasone (DECADRON) 4 MG tablet TAKE FIVE TABLETS BY MOUTH EVERY WEEK START WITH THE FIRST DOSE OF CHEMOTHERAPY 80 tablet 0  . fluconazole (DIFLUCAN) 100 MG tablet Take 1 tablet (100 mg total) by mouth daily. 7 tablet 0  . lidocaine-prilocaine (EMLA) cream Apply 1 application topically as needed. Apply one hour before use and cover with plastic wrap. 30 g 1  . lisinopril (PRINIVIL,ZESTRIL) 10 MG tablet Take 5 mg by mouth every evening.     [name] oxyCODONE-acetaminophen (PERCOCET/ROXICET) 5-325 MG  per tablet Take 1 tablet by mouth every 6 (six) hours as needed for severe pain. 30 tablet 0  . pantoprazole (PROTONIX) 40 MG tablet TAKE ONE TABLET BY MOUTH TWICE DAILY PATIENT  NEEDS  OFFICE  VISIT  FOR  FURTHER  REFILLS 60 tablet 0  . pioglitazone (ACTOS) 15 MG tablet Take 15 mg by mouth daily.    . pomalidomide (POMALYST) 4 MG capsule TAKE 1 CAPSULE BY MOUTH ON DAYS 1-21, REPEAT EVERY 28 DAYS. Authorization number [id number] 12/26/14 adult male 21 capsule 0  .  prochlorperazine (COMPAZINE) 10 MG tablet Take 10 mg by mouth every 6 (six) hours as needed for nausea or vomiting.    . rivaroxaban (XARELTO) 20 MG TABS tablet Take 1 tablet (20 mg total) by mouth daily with supper. 30 tablet 1  . simvastatin (ZOCOR) 10 MG tablet Take 10 mg by mouth at bedtime.      . temazepam (RESTORIL) 15 MG capsule Take 15 mg by mouth at bedtime as needed for sleep.    . valACYclovir (VALTREX) 500 MG tablet TAKE ONE CAPLET BY MOUTH ONCE DAILY 30 tablet 3  . VIAGRA 50 MG tablet Take 50 mg by mouth daily as needed for erectile dysfunction.      No current facility-administered medications for this visit.    SURGICAL HISTORY:  Past Surgical History  Procedure Laterality Date  . Limbal stem cell transplant    . Humerus fracture surgery      right  . 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    REVIEW OF SYSTEMS:  A comprehensive review of systems was negative except for: Constitutional: positive for fatigue   PHYSICAL EXAMINATION: General appearance: alert, cooperative and no distress Head: Normocephalic, without obvious abnormality, atraumatic Neck: no adenopathy, no JVD, supple, symmetrical, trachea midline and thyroid not enlarged, symmetric, no tenderness/mass/nodules Lymph nodes: Cervical, supraclavicular, and axillary nodes normal. Resp: clear to auscultation bilaterally Back: symmetric, no curvature. ROM normal. No CVA tenderness. Cardio: regular rate and rhythm, S1, S2 normal, no murmur, click, rub or gallop GI: soft, non-tender; bowel sounds normal; no masses,  no organomegaly Extremities: extremities normal, atraumatic, no cyanosis or edema Neurologic: Alert and oriented X 3, normal strength and tone. Normal symmetric reflexes. Normal coordination and gait  ECOG PERFORMANCE STATUS: 1 - Symptomatic but completely ambulatory  Blood pressure 151/94, pulse 65, temperature 98 F (36.7 C), temperature source Oral, resp. rate 17, height _0   (1.854 m), weight 174 lb 9.6 oz (79.198 kg), SpO2 100 %.  LABORATORY DATA: CBC Latest Ref Rng 12/29/2014 12/18/2014 12/11/2014  WBC 4.0 - 10.3 10e3/uL 3.0(L) 4.1 3.5(L)  Hemoglobin 13.0 - 17.1 g/dL 10.1(L) 8.8(L) 9.4(L)  Hematocrit 38.4 - 49.9 % 32.8(L) 28.4(L) 30.4(L)  Platelets 140 - 400 10e3/uL 170 128(L) 113(L)   CMP Latest Ref Rng 12/29/2014 12/18/2014 12/11/2014  Glucose 70 - 140 mg/dl 102 126 116  BUN 7.0 - 26.0 mg/dL 12.2 10.5 12.7  Creatinine 0.7 - 1.3 mg/dL 1.0 1.0 1.0  Sodium 136 - 145 mEq/L 140 139 139  Potassium 3.5 - 5.1 mEq/L 4.5 3.7 3.9  Chloride 96 - 112 mEq/L - - -  CO2 22 - 29 mEq/L _1 Calcium 8.4 - 10.4 mg/dL 9.3 9.0 9.1  Total Protein 6.4 - 8.3 g/dL 6.3(L) 5.4(L) 5.5(L)  Total Bilirubin 0.20 - 1.20 mg/dL 2.13(H) 2.83(H) 2.32(H)  Alkaline Phos 40 - 150 U/L 42 31(L) 34(L)  AST 5 - 34 U/L  _0 ALT 0 - 55 U/L _1 Other lab results:   RADIOGRAPHIC STUDIES: No results found.  ASSESSMENT AND PLAN: This is a very pleasant 69 years old [place] American male with history of multiple myeloma currently undergoing systemic chemotherapy with Carfilzomib, cyclophosphamide and Decadron status post 19 cycles. This was discontinued secondary to disease progression. The patient was started on systemic chemotherapy with Carfilzomib, Pomalyst and Decadron status post 8 cycles. He is tolerating his treatment fairly well except for mild pancytopenia and mildly abnormal liver functions We will likely proceed with cycle #9 today as a scheduled on 01/01/2015. The case was discussed with Eddie Thomas in absence of Eddie. Julien Nordmann was out of our office today The patient will return on 01/01/2015, with labs taken prior to his scheduled treatment, hopefully with improvement of his counts. He then would come back for follow-up visit in 4 weeks before starting cycle #10 for evaluation after repeating myeloma panel. He'll continue on Zometa every 2 months. For the venous thrombosis,  he will continue on Xarelto 20 mg by mouth daily. He was advised to call immediately if he has any concerning symptoms in the interval.  The patient voices understanding of current disease status and treatment options and is in agreement with the current care plan. All questions were answered. The patient knows to call the clinic with any problems, questions or concerns. We can certainly see the patient much sooner if necessary.

## 2014-12-29 NOTE — Telephone Encounter (Signed)
per pof to add labs-gave pt updated copy of avs

## 2015-01-01 ENCOUNTER — Other Ambulatory Visit (HOSPITAL_BASED_OUTPATIENT_CLINIC_OR_DEPARTMENT_OTHER): Payer: Medicare Other

## 2015-01-01 ENCOUNTER — Ambulatory Visit (HOSPITAL_BASED_OUTPATIENT_CLINIC_OR_DEPARTMENT_OTHER): Payer: Medicare Other

## 2015-01-01 ENCOUNTER — Other Ambulatory Visit: Payer: Self-pay | Admitting: Internal Medicine

## 2015-01-01 ENCOUNTER — Other Ambulatory Visit: Payer: Medicare Other

## 2015-01-01 VITALS — BP 138/67 | HR 64 | Temp 98.2°F | Resp 18

## 2015-01-01 DIAGNOSIS — Z5112 Encounter for antineoplastic immunotherapy: Secondary | ICD-10-CM | POA: Diagnosis present

## 2015-01-01 DIAGNOSIS — C9000 Multiple myeloma not having achieved remission: Secondary | ICD-10-CM

## 2015-01-01 LAB — CBC WITH DIFFERENTIAL/PLATELET
BASO%: 0.4 % (ref 0.0–2.0)
BASOS ABS: 0 10*3/uL (ref 0.0–0.1)
EOS ABS: 0 10*3/uL (ref 0.0–0.5)
EOS%: 0.4 % (ref 0.0–7.0)
HCT: 32.9 % — ABNORMAL LOW (ref 38.4–49.9)
HGB: 9.9 g/dL — ABNORMAL LOW (ref 13.0–17.1)
LYMPH%: 36.5 % (ref 14.0–49.0)
MCH: 24.8 pg — AB (ref 27.2–33.4)
MCHC: 30.1 g/dL — AB (ref 32.0–36.0)
MCV: 82.3 fL (ref 79.3–98.0)
MONO#: 0.5 10*3/uL (ref 0.1–0.9)
MONO%: 16.7 % — ABNORMAL HIGH (ref 0.0–14.0)
NEUT#: 1.3 10*3/uL — ABNORMAL LOW (ref 1.5–6.5)
NEUT%: 46 % (ref 39.0–75.0)
PLATELETS: 163 10*3/uL (ref 140–400)
RBC: 4 10*6/uL — AB (ref 4.20–5.82)
RDW: 16.2 % — ABNORMAL HIGH (ref 11.0–14.6)
WBC: 2.8 10*3/uL — ABNORMAL LOW (ref 4.0–10.3)
lymph#: 1 10*3/uL (ref 0.9–3.3)

## 2015-01-01 LAB — KAPPA/LAMBDA LIGHT CHAINS
KAPPA LAMBDA RATIO: 0.01 — AB (ref 0.26–1.65)
Kappa free light chain: 0.03 mg/dL — ABNORMAL LOW (ref 0.33–1.94)
LAMBDA FREE LGHT CHN: 5.38 mg/dL — AB (ref 0.57–2.63)

## 2015-01-01 LAB — COMPREHENSIVE METABOLIC PANEL
ALT: 11 U/L (ref 0–55)
ANION GAP: 9 meq/L (ref 3–11)
AST: 14 U/L (ref 5–34)
Albumin: 3.8 g/dL (ref 3.5–5.0)
Alkaline Phosphatase: 41 U/L (ref 40–150)
BILIRUBIN TOTAL: 2.07 mg/dL — AB (ref 0.20–1.20)
BUN: 13.1 mg/dL (ref 7.0–26.0)
CALCIUM: 9.6 mg/dL (ref 8.4–10.4)
CO2: 25 meq/L (ref 22–29)
CREATININE: 1.2 mg/dL (ref 0.7–1.3)
Chloride: 105 mEq/L (ref 98–109)
EGFR: 74 mL/min/{1.73_m2} — ABNORMAL LOW (ref >90)
Glucose: 126 mg/dl (ref 70–140)
Potassium: 4.3 mEq/L (ref 3.5–5.1)
Sodium: 138 mEq/L (ref 136–145)
TOTAL PROTEIN: 6.2 g/dL — AB (ref 6.4–8.3)

## 2015-01-01 LAB — IGG, IGA, IGM
IgA: 398 mg/dL — ABNORMAL HIGH (ref 68–379)
IgG (Immunoglobin G), Serum: 106 mg/dL — ABNORMAL LOW (ref 650–1600)
IgM, Serum: <5 mg/dL — ABNORMAL LOW (ref 41–251)

## 2015-01-01 LAB — BETA 2 MICROGLOBULIN, SERUM: BETA 2 MICROGLOBULIN: 2.81 mg/L — AB (ref <=2.51)

## 2015-01-01 MED ORDER — DEXTROSE 5 % IV SOLN
36.0000 mg/m2 | Freq: Once | INTRAVENOUS | Status: AC
  Administered 2015-01-01: 72 mg via INTRAVENOUS
  Filled 2015-01-01: qty 36

## 2015-01-01 MED ORDER — SODIUM CHLORIDE 0.9 % IJ SOLN
10.0000 mL | INTRAMUSCULAR | Status: DC | PRN
  Administered 2015-01-01: 10 mL
  Filled 2015-01-01: qty 10

## 2015-01-01 MED ORDER — SODIUM CHLORIDE 0.9 % IV SOLN
Freq: Once | INTRAVENOUS | Status: AC
  Administered 2015-01-01: 11:00:00 via INTRAVENOUS
  Filled 2015-01-01: qty 4

## 2015-01-01 MED ORDER — ZOLEDRONIC ACID 4 MG/100ML IV SOLN
4.0000 mg | Freq: Once | INTRAVENOUS | Status: AC
  Administered 2015-01-01: 4 mg via INTRAVENOUS
  Filled 2015-01-01: qty 100

## 2015-01-01 MED ORDER — SODIUM CHLORIDE 0.9 % IV SOLN
Freq: Once | INTRAVENOUS | Status: AC
  Administered 2015-01-01: 10:00:00 via INTRAVENOUS

## 2015-01-01 MED ORDER — HEPARIN SOD (PORK) LOCK FLUSH 100 UNIT/ML IV SOLN
500.0000 [IU] | Freq: Once | INTRAVENOUS | Status: AC | PRN
  Administered 2015-01-01: 500 [IU]
  Filled 2015-01-01: qty 5

## 2015-01-01 NOTE — Patient Instructions (Signed)
[facility] Discharge Instructions for Patients Receiving Chemotherapy  Today you received the following chemotherapy agents Kyprolis and Zometa.  To help prevent nausea and vomiting after your treatment, we encourage you to take your nausea medication as directed.  If you develop nausea and vomiting that is not controlled by your nausea medication, call the clinic.   BELOW ARE SYMPTOMS THAT SHOULD BE REPORTED IMMEDIATELY:  *FEVER GREATER THAN 100.5 F  *CHILLS WITH OR WITHOUT FEVER  NAUSEA AND VOMITING THAT IS NOT CONTROLLED WITH YOUR NAUSEA MEDICATION  *UNUSUAL SHORTNESS OF BREATH  *UNUSUAL BRUISING OR BLEEDING  TENDERNESS IN MOUTH AND THROAT WITH OR WITHOUT PRESENCE OF ULCERS  *URINARY PROBLEMS  *BOWEL PROBLEMS  UNUSUAL RASH Items with * indicate a potential emergency and should be followed up as soon as possible.  Feel free to call the clinic you have any questions or concerns. The clinic phone number is [phone].  Please show the CHEMO ALERT CARD at check-in to the Emergency Department and triage nurse.    

## 2015-01-01 NOTE — Progress Notes (Signed)
OK to treat today per Dr. Julien Nordmann after labs were reviewed.

## 2015-01-02 ENCOUNTER — Other Ambulatory Visit: Payer: Self-pay | Admitting: Gastroenterology

## 2015-01-02 ENCOUNTER — Ambulatory Visit (HOSPITAL_BASED_OUTPATIENT_CLINIC_OR_DEPARTMENT_OTHER): Payer: Medicare Other

## 2015-01-02 VITALS — BP 128/62 | HR 66 | Temp 98.2°F | Resp 18

## 2015-01-02 DIAGNOSIS — Z5112 Encounter for antineoplastic immunotherapy: Secondary | ICD-10-CM | POA: Diagnosis present

## 2015-01-02 DIAGNOSIS — C9000 Multiple myeloma not having achieved remission: Secondary | ICD-10-CM | POA: Diagnosis not present

## 2015-01-02 MED ORDER — SODIUM CHLORIDE 0.9 % IJ SOLN
10.0000 mL | INTRAMUSCULAR | Status: DC | PRN
  Filled 2015-01-02: qty 10

## 2015-01-02 MED ORDER — SODIUM CHLORIDE 0.9 % IV SOLN: Freq: Once | INTRAVENOUS | Status: DC

## 2015-01-02 MED ORDER — DEXTROSE 5 % IV SOLN
36.0000 mg/m2 | Freq: Once | INTRAVENOUS | Status: AC
  Administered 2015-01-02: 72 mg via INTRAVENOUS
  Filled 2015-01-02: qty 36

## 2015-01-02 MED ORDER — SODIUM CHLORIDE 0.9 % IV SOLN
Freq: Once | INTRAVENOUS | Status: AC
  Administered 2015-01-02: 09:00:00 via INTRAVENOUS
  Filled 2015-01-02: qty 4

## 2015-01-02 MED ORDER — SODIUM CHLORIDE 0.9 % IV SOLN
Freq: Once | INTRAVENOUS | Status: AC
  Administered 2015-01-02: 09:00:00 via INTRAVENOUS

## 2015-01-02 MED ORDER — HEPARIN SOD (PORK) LOCK FLUSH 100 UNIT/ML IV SOLN
500.0000 [IU] | Freq: Once | INTRAVENOUS | Status: AC | PRN
  Administered 2015-01-02: 500 [IU]
  Filled 2015-01-02: qty 5

## 2015-01-02 NOTE — Patient Instructions (Signed)
[facility] Discharge Instructions for Patients Receiving Chemotherapy  Today you received the following chemotherapy agents Kyprolis and Zometa.  To help prevent nausea and vomiting after your treatment, we encourage you to take your nausea medication as directed.  If you develop nausea and vomiting that is not controlled by your nausea medication, call the clinic.   BELOW ARE SYMPTOMS THAT SHOULD BE REPORTED IMMEDIATELY:  *FEVER GREATER THAN 100.5 F  *CHILLS WITH OR WITHOUT FEVER  NAUSEA AND VOMITING THAT IS NOT CONTROLLED WITH YOUR NAUSEA MEDICATION  *UNUSUAL SHORTNESS OF BREATH  *UNUSUAL BRUISING OR BLEEDING  TENDERNESS IN MOUTH AND THROAT WITH OR WITHOUT PRESENCE OF ULCERS  *URINARY PROBLEMS  *BOWEL PROBLEMS  UNUSUAL RASH Items with * indicate a potential emergency and should be followed up as soon as possible.  Feel free to call the clinic you have any questions or concerns. The clinic phone number is [phone].  Please show the CHEMO ALERT CARD at check-in to the Emergency Department and triage nurse.    

## 2015-01-04 ENCOUNTER — Other Ambulatory Visit: Payer: Self-pay | Admitting: *Deleted

## 2015-01-04 DIAGNOSIS — C9000 Multiple myeloma not having achieved remission: Secondary | ICD-10-CM

## 2015-01-08 ENCOUNTER — Ambulatory Visit (HOSPITAL_BASED_OUTPATIENT_CLINIC_OR_DEPARTMENT_OTHER): Payer: Medicare Other

## 2015-01-08 ENCOUNTER — Other Ambulatory Visit (HOSPITAL_BASED_OUTPATIENT_CLINIC_OR_DEPARTMENT_OTHER): Payer: Medicare Other

## 2015-01-08 VITALS — BP 158/69 | HR 63 | Temp 98.3°F | Resp 19

## 2015-01-08 DIAGNOSIS — C9000 Multiple myeloma not having achieved remission: Secondary | ICD-10-CM

## 2015-01-08 DIAGNOSIS — Z5112 Encounter for antineoplastic immunotherapy: Secondary | ICD-10-CM

## 2015-01-08 LAB — COMPREHENSIVE METABOLIC PANEL
ALBUMIN: 3.5 g/dL (ref 3.5–5.0)
ALK PHOS: 40 U/L (ref 40–150)
ALT: 11 U/L (ref 0–55)
ANION GAP: 9 meq/L (ref 3–11)
AST: 11 U/L (ref 5–34)
BILIRUBIN TOTAL: 1.79 mg/dL — AB (ref 0.20–1.20)
BUN: 10.8 mg/dL (ref 7.0–26.0)
CALCIUM: 9.2 mg/dL (ref 8.4–10.4)
CO2: 24 mEq/L (ref 22–29)
Chloride: 105 mEq/L (ref 98–109)
Creatinine: 1.1 mg/dL (ref 0.7–1.3)
EGFR: 83 mL/min/{1.73_m2} — AB (ref >90)
Glucose: 141 mg/dl — ABNORMAL HIGH (ref 70–140)
POTASSIUM: 4.4 meq/L (ref 3.5–5.1)
Sodium: 138 mEq/L (ref 136–145)
TOTAL PROTEIN: 5.9 g/dL — AB (ref 6.4–8.3)

## 2015-01-08 LAB — CBC WITH DIFFERENTIAL/PLATELET
BASO%: 0.4 % (ref 0.0–2.0)
BASOS ABS: 0 10*3/uL (ref 0.0–0.1)
EOS ABS: 0.1 10*3/uL (ref 0.0–0.5)
EOS%: 2.2 % (ref 0.0–7.0)
HCT: 30.3 % — ABNORMAL LOW (ref 38.4–49.9)
HEMOGLOBIN: 9.1 g/dL — AB (ref 13.0–17.1)
LYMPH%: 18.8 % (ref 14.0–49.0)
MCH: 24.5 pg — AB (ref 27.2–33.4)
MCHC: 30.1 g/dL — ABNORMAL LOW (ref 32.0–36.0)
MCV: 81.2 fL (ref 79.3–98.0)
MONO#: 0.2 10*3/uL (ref 0.1–0.9)
MONO%: 5.1 % (ref 0.0–14.0)
NEUT%: 73.5 % (ref 39.0–75.0)
NEUTROS ABS: 3.4 10*3/uL (ref 1.5–6.5)
PLATELETS: 125 10*3/uL — AB (ref 140–400)
RBC: 3.73 10*6/uL — ABNORMAL LOW (ref 4.20–5.82)
RDW: 15.7 % — AB (ref 11.0–14.6)
WBC: 4.6 10*3/uL (ref 4.0–10.3)
lymph#: 0.9 10*3/uL (ref 0.9–3.3)

## 2015-01-08 MED ORDER — SODIUM CHLORIDE 0.9 % IV SOLN: Freq: Once | INTRAVENOUS | Status: DC

## 2015-01-08 MED ORDER — SODIUM CHLORIDE 0.9 % IV SOLN
Freq: Once | INTRAVENOUS | Status: AC
  Administered 2015-01-08: 09:00:00 via INTRAVENOUS

## 2015-01-08 MED ORDER — SODIUM CHLORIDE 0.9 % IV SOLN
Freq: Once | INTRAVENOUS | Status: AC
  Administered 2015-01-08: 09:00:00 via INTRAVENOUS
  Filled 2015-01-08: qty 4

## 2015-01-08 MED ORDER — DEXTROSE 5 % IV SOLN
36.0000 mg/m2 | Freq: Once | INTRAVENOUS | Status: AC
  Administered 2015-01-08: 72 mg via INTRAVENOUS
  Filled 2015-01-08: qty 36

## 2015-01-08 MED ORDER — SODIUM CHLORIDE 0.9 % IJ SOLN
10.0000 mL | INTRAMUSCULAR | Status: DC | PRN
  Administered 2015-01-08: 10 mL
  Filled 2015-01-08: qty 10

## 2015-01-08 MED ORDER — HEPARIN SOD (PORK) LOCK FLUSH 100 UNIT/ML IV SOLN
500.0000 [IU] | Freq: Once | INTRAVENOUS | Status: AC | PRN
  Administered 2015-01-08: 500 [IU]
  Filled 2015-01-08: qty 5

## 2015-01-08 NOTE — Progress Notes (Signed)
Okay to treat today with elevated total bili per Dr. Julien Nordmann.

## 2015-01-08 NOTE — Patient Instructions (Signed)
[facility] Discharge Instructions for Patients Receiving Chemotherapy  Today you received the following chemotherapy agents: Kyprolis   To help prevent nausea and vomiting after your treatment, we encourage you to take your nausea medication as directed.    If you develop nausea and vomiting that is not controlled by your nausea medication, call the clinic.   BELOW ARE SYMPTOMS THAT SHOULD BE REPORTED IMMEDIATELY:  *FEVER GREATER THAN 100.5 F  *CHILLS WITH OR WITHOUT FEVER  NAUSEA AND VOMITING THAT IS NOT CONTROLLED WITH YOUR NAUSEA MEDICATION  *UNUSUAL SHORTNESS OF BREATH  *UNUSUAL BRUISING OR BLEEDING  TENDERNESS IN MOUTH AND THROAT WITH OR WITHOUT PRESENCE OF ULCERS  *URINARY PROBLEMS  *BOWEL PROBLEMS  UNUSUAL RASH Items with * indicate a potential emergency and should be followed up as soon as possible.  Feel free to call the clinic you have any questions or concerns. The clinic phone number is [phone].  Please show the CHEMO ALERT CARD at check-in to the Emergency Department and triage nurse.   

## 2015-01-09 ENCOUNTER — Encounter: Payer: Self-pay | Admitting: Internal Medicine

## 2015-01-09 ENCOUNTER — Telehealth: Payer: Self-pay | Admitting: Internal Medicine

## 2015-01-09 ENCOUNTER — Ambulatory Visit (HOSPITAL_BASED_OUTPATIENT_CLINIC_OR_DEPARTMENT_OTHER): Payer: Medicare Other | Admitting: Internal Medicine

## 2015-01-09 ENCOUNTER — Ambulatory Visit (HOSPITAL_BASED_OUTPATIENT_CLINIC_OR_DEPARTMENT_OTHER): Payer: Medicare Other

## 2015-01-09 VITALS — BP 140/77 | HR 73 | Temp 97.8°F | Resp 17 | Ht 73.0 in | Wt 177.4 lb

## 2015-01-09 DIAGNOSIS — D61818 Other pancytopenia: Secondary | ICD-10-CM

## 2015-01-09 DIAGNOSIS — C9000 Multiple myeloma not having achieved remission: Secondary | ICD-10-CM

## 2015-01-09 DIAGNOSIS — D6481 Anemia due to antineoplastic chemotherapy: Secondary | ICD-10-CM

## 2015-01-09 DIAGNOSIS — I82409 Acute embolism and thrombosis of unspecified deep veins of unspecified lower extremity: Secondary | ICD-10-CM

## 2015-01-09 DIAGNOSIS — M5441 Lumbago with sciatica, right side: Secondary | ICD-10-CM

## 2015-01-09 DIAGNOSIS — Z5112 Encounter for antineoplastic immunotherapy: Secondary | ICD-10-CM

## 2015-01-09 DIAGNOSIS — T451X5A Adverse effect of antineoplastic and immunosuppressive drugs, initial encounter: Secondary | ICD-10-CM

## 2015-01-09 MED ORDER — HEPARIN SOD (PORK) LOCK FLUSH 100 UNIT/ML IV SOLN
500.0000 [IU] | Freq: Once | INTRAVENOUS | Status: AC | PRN
  Administered 2015-01-09: 500 [IU]
  Filled 2015-01-09: qty 5

## 2015-01-09 MED ORDER — SODIUM CHLORIDE 0.9 % IV SOLN: Freq: Once | INTRAVENOUS | Status: DC

## 2015-01-09 MED ORDER — DEXAMETHASONE SODIUM PHOSPHATE 100 MG/10ML IJ SOLN
Freq: Once | INTRAMUSCULAR | Status: AC
  Administered 2015-01-09: 15:00:00 via INTRAVENOUS
  Filled 2015-01-09: qty 4

## 2015-01-09 MED ORDER — SODIUM CHLORIDE 0.9 % IJ SOLN
10.0000 mL | INTRAMUSCULAR | Status: DC | PRN
  Administered 2015-01-09: 10 mL
  Filled 2015-01-09: qty 10

## 2015-01-09 MED ORDER — DEXTROSE 5 % IV SOLN
36.0000 mg/m2 | Freq: Once | INTRAVENOUS | Status: AC
  Administered 2015-01-09: 72 mg via INTRAVENOUS
  Filled 2015-01-09: qty 36

## 2015-01-09 MED ORDER — SODIUM CHLORIDE 0.9 % IV SOLN
Freq: Once | INTRAVENOUS | Status: AC
  Administered 2015-01-09: 15:00:00 via INTRAVENOUS

## 2015-01-09 NOTE — Patient Instructions (Signed)
[facility] Discharge Instructions for Patients Receiving Chemotherapy  Today you received the following chemotherapy agents: Kyprolis   To help prevent nausea and vomiting after your treatment, we encourage you to take your nausea medication as directed.    If you develop nausea and vomiting that is not controlled by your nausea medication, call the clinic.   BELOW ARE SYMPTOMS THAT SHOULD BE REPORTED IMMEDIATELY:  *FEVER GREATER THAN 100.5 F  *CHILLS WITH OR WITHOUT FEVER  NAUSEA AND VOMITING THAT IS NOT CONTROLLED WITH YOUR NAUSEA MEDICATION  *UNUSUAL SHORTNESS OF BREATH  *UNUSUAL BRUISING OR BLEEDING  TENDERNESS IN MOUTH AND THROAT WITH OR WITHOUT PRESENCE OF ULCERS  *URINARY PROBLEMS  *BOWEL PROBLEMS  UNUSUAL RASH Items with * indicate a potential emergency and should be followed up as soon as possible.  Feel free to call the clinic you have any questions or concerns. The clinic phone number is [phone].  Please show the CHEMO ALERT CARD at check-in to the Emergency Department and triage nurse.   

## 2015-01-09 NOTE — Progress Notes (Signed)
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  2) the venous thrombosis diagnosed in June 2016  PRIOR THERAPY: :  1. Status post 6 cycles of systemic chemotherapy with Revlimid and Decadron, last dose was given 07/21/2010 with very good response. 2. 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 [name].  3. maintenance Revlimid at 10 mg by mouth daily status post 2 months. Therapy began 01/18/2011. 4. maintenance Revlimid at 15 mg by mouth daily with prophylactic dose Coumadin at 2 mg by mouth daily. 5. Systemic chemotherapy with Velcade at 1.3 mg per meter squared given on days 1, 4, 8 and 11 and Doxil at 30 mg per meter square given on day 4 and Decadron 40 mg by mouth on weekly basis given every 3 weeks. Status post 4 cycles. 6. Zometa 4 mg IV every 4 weeks.  7. Velcade 1.3 mg/M2 subcutaneous daily on a weekly basis with Decadron 20 mg by mouth on a weekly basis. First cycle expected on 06/21/2012. S/P 4 cycles. 8. Systemic chemotherapy with Carfilzomib, cyclophosphamide and Decadron. First cycle started on 08/09/2012. He is status post 19 cycles.   CURRENT THERAPY:  1) Systemic chemotherapy with Carfilzomib 36 MG/M2 on days 1, 2, 8, 9, 15 and 16 every 4 weeks, Pomalyst 4 mg by mouth daily for 21 days every 4 weeks and Decadron 40 mg by mouth weekly. First cycle started on 04/10/2014. Status post 9 cycles. 2) Zometa every 8 weeks.  3) Xarelto 20 mg by mouth daily, started 07/17/2014.   INTERVAL HISTORY: Eddie Thomas Ran 69 y.o. male returns to the clinic today for followup visit. He is currently on treatment with Carfilzomib, Pomalyst and dexamethasone status post 9 cycle and he is currently undergoing cycle #10. He is  tolerating his treatment fairly well except for mild pancytopenia. He denied having any significant weight loss or night sweats. He has no fever or chills. He has no nausea or vomiting. The patient denied having any significant chest pain, shortness of breath, cough or hemoptysis. He had myeloma panel performed recently and he is here today for evaluation and discussion of his lab results before starting  day #10 of cycle #10.  MEDICAL HISTORY: Past Medical History  Diagnosis Date  . Multiple myeloma   . Diabetes mellitus ([place]) 07/03/2011  . Hypertension 07/03/2011  . Hyperlipidemia 07/03/2011  . Colon polyps 2012  . Gastric ulcer     ALLERGIES:  has No Known Allergies.  MEDICATIONS:  Current Outpatient Prescriptions  Medication Sig Dispense Refill  . Cholecalciferol (VITAMIN D-3) 5000 UNITS TABS Take 1 tablet by mouth daily.    [name] dexamethasone (DECADRON) 4 MG tablet TAKE FIVE TABLETS BY MOUTH EVERY WEEK START WITH THE FIRST DOSE OF CHEMOTHERAPY 80 tablet 0  . fluconazole (DIFLUCAN) 100 MG tablet Take 1 tablet (100 mg total) by mouth daily. 7 tablet 0  . gabapentin (NEURONTIN) 300 MG capsule Take 300 mg by mouth 3 (three) times daily.    [name] lidocaine-prilocaine (EMLA) cream Apply 1 application topically as needed. Apply one hour before use and cover with plastic wrap. 30 g 1  . lisinopril (PRINIVIL,ZESTRIL) 10 MG tablet Take 5 mg by mouth every evening.     [name] oxyCODONE-acetaminophen (PERCOCET/ROXICET) 5-325 MG  per tablet Take 1 tablet by mouth every 6 (six) hours as needed for severe pain. 30 tablet 0  . pantoprazole (PROTONIX) 40 MG tablet TAKE ONE TABLET BY MOUTH TWICE DAILY PT  NEEDS  OFFICE  VISIT  FOR  FURTHER  REFILLS 60 tablet 0  . pioglitazone (ACTOS) 15 MG tablet Take 15 mg by mouth daily.    . pomalidomide (POMALYST) 4 MG capsule TAKE 1 CAPSULE BY MOUTH ON DAYS 1-21, REPEAT EVERY 28 DAYS. Authorization number [id number] 12/26/14 adult male 21 capsule 0  . prochlorperazine (COMPAZINE)  10 MG tablet Take 10 mg by mouth every 6 (six) hours as needed for nausea or vomiting.    . rivaroxaban (XARELTO) 20 MG TABS tablet Take 1 tablet (20 mg total) by mouth daily with supper. 30 tablet 1  . simvastatin (ZOCOR) 10 MG tablet Take 10 mg by mouth at bedtime.      . temazepam (RESTORIL) 15 MG capsule Take 15 mg by mouth at bedtime as needed for sleep.    . valACYclovir (VALTREX) 500 MG tablet TAKE ONE CAPLET BY MOUTH ONCE DAILY 30 tablet 3  . VIAGRA 50 MG tablet Take 50 mg by mouth daily as needed for erectile dysfunction.      No current facility-administered medications for this visit.    SURGICAL HISTORY:  Past Surgical History  Procedure Laterality Date  . Limbal stem cell transplant    . Humerus fracture surgery      right  . 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    REVIEW OF SYSTEMS:  Constitutional: positive for fatigue Eyes: negative Ears, nose, mouth, throat, and face: negative Respiratory: negative Cardiovascular: negative Gastrointestinal: negative Genitourinary:negative Integument/breast: negative Hematologic/lymphatic: negative Musculoskeletal:positive for arthralgias Neurological: negative Behavioral/Psych: negative Endocrine: negative Allergic/Immunologic: negative   PHYSICAL EXAMINATION: General appearance: alert, cooperative and no distress Head: Normocephalic, without obvious abnormality, atraumatic Neck: no adenopathy, no JVD, supple, symmetrical, trachea midline and thyroid not enlarged, symmetric, no tenderness/mass/nodules Lymph nodes: Cervical, supraclavicular, and axillary nodes normal. Resp: clear to auscultation bilaterally Back: symmetric, no curvature. ROM normal. No CVA tenderness. Cardio: regular rate and rhythm, S1, S2 normal, no murmur, click, rub or gallop GI: soft, non-tender; bowel sounds normal; no masses,  no organomegaly Extremities: extremities normal, atraumatic, no cyanosis or edema Neurologic: Alert and  oriented X 3, normal strength and tone. Normal symmetric reflexes. Normal coordination and gait  ECOG PERFORMANCE STATUS: 1 - Symptomatic but completely ambulatory  Blood pressure 140/77, pulse 73, temperature 97.8 F (36.6 C), temperature source Oral, resp. rate 17, height 6' 1"  (1.854 m), weight 177 lb 6.4 oz (80.468 kg), SpO2 100 %.  LABORATORY DATA: Lab Results  Component Value Date   WBC 4.6 01/08/2015   HGB 9.1* 01/08/2015   HCT 30.3* 01/08/2015   MCV 81.2 01/08/2015   PLT 125* 01/08/2015      Chemistry      Component Value Date/Time   NA 138 01/08/2015 0822   NA 139 08/30/2013 0935   K 4.4 01/08/2015 0822   K 4.1 08/30/2013 0935   CL 101 08/30/2013 0935   CL 103 07/12/2012 0907   CO2 24 01/08/2015 0822   CO2 26 08/30/2013 0935   BUN 10.8 01/08/2015 0822   BUN 11 08/30/2013 0935   CREATININE 1.1 01/08/2015 0822   CREATININE 1.03 08/30/2013 0935      Component Value Date/Time   CALCIUM 9.2 01/08/2015 0822   CALCIUM 9.2 08/30/2013 0935  ALKPHOS 40 01/08/2015 0822   ALKPHOS 39 08/30/2013 0935   AST 11 01/08/2015 0822   AST 13 08/30/2013 0935   ALT 11 01/08/2015 0822   ALT 9 08/30/2013 0935   BILITOT 1.79* 01/08/2015 0822   BILITOT 1.3* 08/30/2013 0935     Other lab results: Beta-2 microglobulin 2.81, free kappa light chain 0.03, free lambda light chain 5.78 with a kappa/lambda ratio of 0.01. IgG 106, IgA 398 and IgM less than 5.  RADIOGRAPHIC STUDIES: No results found.  ASSESSMENT AND PLAN: This is a very pleasant 69 years old [place] American male with history of multiple myeloma currently undergoing systemic chemotherapy with Carfilzomib, cyclophosphamide and Decadron status post 19 cycles. This was discontinued secondary to disease progression. The patient was started on systemic chemotherapy with Carfilzomib, Pomalyst and Decadron status post 9 cycles and he is currently undergoing cycle #10. He is tolerating his treatment fairly well except for mild  pancytopenia. The recent myeloma panel showed stable disease except for slight increase of the free lambda light chain. I discussed the lab result with the patient today. I recommended for him to continue his current treatment with Carfilzomib, Pomalyst and Decadron. The patient would come back for follow-up visit in 2 weeks before starting cycle #11 for evaluation. He'll continue on Zometa every 2 months. For the venous thrombosis, he will continue on Xarelto 20 mg by mouth daily. He was advised to call immediately if he has any concerning symptoms in the interval.  The patient voices understanding of current disease status and treatment options and is in agreement with the current care plan. All questions were answered. The patient knows to call the clinic with any problems, questions or concerns. We can certainly see the patient much sooner if necessary.  Disclaimer: This note was dictated with voice recognition software. Similar sounding words can inadvertently be transcribed and may not be corrected upon review.

## 2015-01-09 NOTE — Telephone Encounter (Signed)
Scheduled appt with patient while he was here in person at [facility] Scheduling office.       [facility]. °

## 2015-01-10 ENCOUNTER — Telehealth: Payer: Self-pay | Admitting: *Deleted

## 2015-01-10 NOTE — Telephone Encounter (Signed)
Per staff message and POF I have scheduled appts. Advised scheduler of appts. JMW  

## 2015-01-11 ENCOUNTER — Other Ambulatory Visit: Payer: Self-pay | Admitting: Medical Oncology

## 2015-01-11 DIAGNOSIS — C9000 Multiple myeloma not having achieved remission: Secondary | ICD-10-CM

## 2015-01-16 ENCOUNTER — Other Ambulatory Visit (HOSPITAL_BASED_OUTPATIENT_CLINIC_OR_DEPARTMENT_OTHER): Payer: Medicare Other

## 2015-01-16 ENCOUNTER — Ambulatory Visit (HOSPITAL_BASED_OUTPATIENT_CLINIC_OR_DEPARTMENT_OTHER): Payer: Medicare Other

## 2015-01-16 VITALS — BP 143/67 | HR 55 | Temp 98.2°F | Resp 18

## 2015-01-16 DIAGNOSIS — C9000 Multiple myeloma not having achieved remission: Secondary | ICD-10-CM

## 2015-01-16 DIAGNOSIS — Z5112 Encounter for antineoplastic immunotherapy: Secondary | ICD-10-CM

## 2015-01-16 LAB — CBC WITH DIFFERENTIAL/PLATELET
BASO%: 0.2 % (ref 0.0–2.0)
BASOS ABS: 0 10*3/uL (ref 0.0–0.1)
EOS ABS: 0.2 10*3/uL (ref 0.0–0.5)
EOS%: 4.3 % (ref 0.0–7.0)
HCT: 27.9 % — ABNORMAL LOW (ref 38.4–49.9)
HEMOGLOBIN: 8.7 g/dL — AB (ref 13.0–17.1)
LYMPH#: 0.9 10*3/uL (ref 0.9–3.3)
LYMPH%: 21.6 % (ref 14.0–49.0)
MCH: 25.1 pg — AB (ref 27.2–33.4)
MCHC: 31.2 g/dL — ABNORMAL LOW (ref 32.0–36.0)
MCV: 80.6 fL (ref 79.3–98.0)
MONO#: 0.6 10*3/uL (ref 0.1–0.9)
MONO%: 13.5 % (ref 0.0–14.0)
NEUT#: 2.5 10*3/uL (ref 1.5–6.5)
NEUT%: 60.4 % (ref 39.0–75.0)
NRBC: 0 % (ref 0–0)
PLATELETS: 104 10*3/uL — AB (ref 140–400)
RBC: 3.46 10*6/uL — ABNORMAL LOW (ref 4.20–5.82)
RDW: 15.4 % — ABNORMAL HIGH (ref 11.0–14.6)
WBC: 4.2 10*3/uL (ref 4.0–10.3)

## 2015-01-16 LAB — COMPREHENSIVE METABOLIC PANEL
ALBUMIN: 3.7 g/dL (ref 3.5–5.0)
ALK PHOS: 31 U/L — AB (ref 40–150)
ALT: 13 U/L (ref 0–55)
ANION GAP: 8 meq/L (ref 3–11)
AST: 12 U/L (ref 5–34)
BILIRUBIN TOTAL: 1.76 mg/dL — AB (ref 0.20–1.20)
BUN: 12.7 mg/dL (ref 7.0–26.0)
CO2: 23 mEq/L (ref 22–29)
Calcium: 9.2 mg/dL (ref 8.4–10.4)
Chloride: 106 mEq/L (ref 98–109)
Creatinine: 1 mg/dL (ref 0.7–1.3)
EGFR: 84 mL/min/{1.73_m2} — AB (ref >90)
Glucose: 111 mg/dl (ref 70–140)
POTASSIUM: 4.2 meq/L (ref 3.5–5.1)
Sodium: 137 mEq/L (ref 136–145)
TOTAL PROTEIN: 5.8 g/dL — AB (ref 6.4–8.3)

## 2015-01-16 MED ORDER — SODIUM CHLORIDE 0.9 % IV SOLN: Freq: Once | INTRAVENOUS | Status: DC

## 2015-01-16 MED ORDER — DEXTROSE 5 % IV SOLN
36.0000 mg/m2 | Freq: Once | INTRAVENOUS | Status: AC
  Administered 2015-01-16: 72 mg via INTRAVENOUS
  Filled 2015-01-16: qty 36

## 2015-01-16 MED ORDER — SODIUM CHLORIDE 0.9 % IJ SOLN
10.0000 mL | INTRAMUSCULAR | Status: DC | PRN
  Administered 2015-01-16: 10 mL
  Filled 2015-01-16: qty 10

## 2015-01-16 MED ORDER — SODIUM CHLORIDE 0.9 % IV SOLN
Freq: Once | INTRAVENOUS | Status: AC
  Administered 2015-01-16: 09:00:00 via INTRAVENOUS

## 2015-01-16 MED ORDER — HEPARIN SOD (PORK) LOCK FLUSH 100 UNIT/ML IV SOLN
500.0000 [IU] | Freq: Once | INTRAVENOUS | Status: AC | PRN
  Administered 2015-01-16: 500 [IU]
  Filled 2015-01-16: qty 5

## 2015-01-16 MED ORDER — SODIUM CHLORIDE 0.9 % IV SOLN
Freq: Once | INTRAVENOUS | Status: AC
  Administered 2015-01-16: 09:00:00 via INTRAVENOUS
  Filled 2015-01-16: qty 4

## 2015-01-16 NOTE — Patient Instructions (Signed)
[facility] Discharge Instructions for Patients Receiving Chemotherapy  Today you received the following chemotherapy agents KYPROLIS  To help prevent nausea and vomiting after your treatment, we encourage you to take your nausea medication as prescribed.   If you develop nausea and vomiting that is not controlled by your nausea medication, call the clinic.   BELOW ARE SYMPTOMS THAT SHOULD BE REPORTED IMMEDIATELY:  *FEVER GREATER THAN 100.5 F  *CHILLS WITH OR WITHOUT FEVER  NAUSEA AND VOMITING THAT IS NOT CONTROLLED WITH YOUR NAUSEA MEDICATION  *UNUSUAL SHORTNESS OF BREATH  *UNUSUAL BRUISING OR BLEEDING  TENDERNESS IN MOUTH AND THROAT WITH OR WITHOUT PRESENCE OF ULCERS  *URINARY PROBLEMS  *BOWEL PROBLEMS  UNUSUAL RASH Items with * indicate a potential emergency and should be followed up as soon as possible.  Feel free to call the clinic you have any questions or concerns. The clinic phone number is [phone].  Please show the [place] at check-in to the Emergency Department and triage nurse.

## 2015-01-17 ENCOUNTER — Telehealth: Payer: Self-pay | Admitting: Internal Medicine

## 2015-01-17 ENCOUNTER — Ambulatory Visit (HOSPITAL_BASED_OUTPATIENT_CLINIC_OR_DEPARTMENT_OTHER): Payer: Medicare Other

## 2015-01-17 VITALS — BP 129/66 | HR 52 | Temp 98.0°F | Resp 17

## 2015-01-17 DIAGNOSIS — C9000 Multiple myeloma not having achieved remission: Secondary | ICD-10-CM | POA: Diagnosis present

## 2015-01-17 DIAGNOSIS — Z5112 Encounter for antineoplastic immunotherapy: Secondary | ICD-10-CM

## 2015-01-17 MED ORDER — DEXTROSE 5 % IV SOLN
36.0000 mg/m2 | Freq: Once | INTRAVENOUS | Status: AC
  Administered 2015-01-17: 72 mg via INTRAVENOUS
  Filled 2015-01-17: qty 36

## 2015-01-17 MED ORDER — SODIUM CHLORIDE 0.9 % IV SOLN: Freq: Once | INTRAVENOUS | Status: DC

## 2015-01-17 MED ORDER — SODIUM CHLORIDE 0.9 % IV SOLN
Freq: Once | INTRAVENOUS | Status: AC
  Administered 2015-01-17: 10:00:00 via INTRAVENOUS
  Filled 2015-01-17: qty 4

## 2015-01-17 MED ORDER — SODIUM CHLORIDE 0.9 % IV SOLN
Freq: Once | INTRAVENOUS | Status: AC
  Administered 2015-01-17: 10:00:00 via INTRAVENOUS

## 2015-01-17 MED ORDER — SODIUM CHLORIDE 0.9 % IJ SOLN
10.0000 mL | INTRAMUSCULAR | Status: DC | PRN
  Administered 2015-01-17: 10 mL
  Filled 2015-01-17: qty 10

## 2015-01-17 MED ORDER — HEPARIN SOD (PORK) LOCK FLUSH 100 UNIT/ML IV SOLN
500.0000 [IU] | Freq: Once | INTRAVENOUS | Status: AC | PRN
  Administered 2015-01-17: 500 [IU]
  Filled 2015-01-17: qty 5

## 2015-01-17 NOTE — Telephone Encounter (Signed)
Labs added per pof and patient will get a new avs today at chemo

## 2015-01-17 NOTE — Telephone Encounter (Signed)
Pt came in saying he has no labs scheduled. Labs have already been added. Printed pt's Jan calendar.

## 2015-01-17 NOTE — Patient Instructions (Signed)
[facility] Discharge Instructions for Patients Receiving Chemotherapy  Today you received the following chemotherapy agents KYPROLIS  To help prevent nausea and vomiting after your treatment, we encourage you to take your nausea medication as prescribed.   If you develop nausea and vomiting that is not controlled by your nausea medication, call the clinic.   BELOW ARE SYMPTOMS THAT SHOULD BE REPORTED IMMEDIATELY:  *FEVER GREATER THAN 100.5 F  *CHILLS WITH OR WITHOUT FEVER  NAUSEA AND VOMITING THAT IS NOT CONTROLLED WITH YOUR NAUSEA MEDICATION  *UNUSUAL SHORTNESS OF BREATH  *UNUSUAL BRUISING OR BLEEDING  TENDERNESS IN MOUTH AND THROAT WITH OR WITHOUT PRESENCE OF ULCERS  *URINARY PROBLEMS  *BOWEL PROBLEMS  UNUSUAL RASH Items with * indicate a potential emergency and should be followed up as soon as possible.  Feel free to call the clinic you have any questions or concerns. The clinic phone number is [phone].  Please show the [place] at check-in to the Emergency Department and triage nurse.

## 2015-01-18 ENCOUNTER — Other Ambulatory Visit: Payer: Self-pay | Admitting: Internal Medicine

## 2015-01-19 ENCOUNTER — Other Ambulatory Visit: Payer: Self-pay | Admitting: *Deleted

## 2015-01-19 DIAGNOSIS — C9000 Multiple myeloma not having achieved remission: Secondary | ICD-10-CM

## 2015-01-24 ENCOUNTER — Ambulatory Visit (HOSPITAL_BASED_OUTPATIENT_CLINIC_OR_DEPARTMENT_OTHER): Payer: Medicare Other | Admitting: Internal Medicine

## 2015-01-24 ENCOUNTER — Encounter: Payer: Self-pay | Admitting: Internal Medicine

## 2015-01-24 ENCOUNTER — Other Ambulatory Visit (HOSPITAL_BASED_OUTPATIENT_CLINIC_OR_DEPARTMENT_OTHER): Payer: Medicare Other

## 2015-01-24 ENCOUNTER — Telehealth: Payer: Self-pay | Admitting: Internal Medicine

## 2015-01-24 VITALS — BP 150/72 | HR 65 | Temp 98.6°F | Resp 18 | Ht 73.0 in | Wt 174.4 lb

## 2015-01-24 DIAGNOSIS — C9000 Multiple myeloma not having achieved remission: Secondary | ICD-10-CM | POA: Diagnosis present

## 2015-01-24 DIAGNOSIS — D61818 Other pancytopenia: Secondary | ICD-10-CM

## 2015-01-24 DIAGNOSIS — R17 Unspecified jaundice: Secondary | ICD-10-CM | POA: Diagnosis not present

## 2015-01-24 LAB — CBC WITH DIFFERENTIAL/PLATELET
BASO%: 0.4 % (ref 0.0–2.0)
BASOS ABS: 0 10*3/uL (ref 0.0–0.1)
EOS ABS: 0 10*3/uL (ref 0.0–0.5)
EOS%: 1.6 % (ref 0.0–7.0)
HEMATOCRIT: 29.5 % — AB (ref 38.4–49.9)
HEMOGLOBIN: 9 g/dL — AB (ref 13.0–17.1)
LYMPH%: 32.2 % (ref 14.0–49.0)
MCH: 24.7 pg — AB (ref 27.2–33.4)
MCHC: 30.4 g/dL — AB (ref 32.0–36.0)
MCV: 81.5 fL (ref 79.3–98.0)
MONO#: 0.5 10*3/uL (ref 0.1–0.9)
MONO%: 26.6 % — AB (ref 0.0–14.0)
NEUT#: 0.8 10*3/uL — ABNORMAL LOW (ref 1.5–6.5)
NEUT%: 39.2 % (ref 39.0–75.0)
Platelets: 130 10*3/uL — ABNORMAL LOW (ref 140–400)
RBC: 3.62 10*6/uL — ABNORMAL LOW (ref 4.20–5.82)
RDW: 16.3 % — AB (ref 11.0–14.6)
WBC: 2 10*3/uL — ABNORMAL LOW (ref 4.0–10.3)
lymph#: 0.6 10*3/uL — ABNORMAL LOW (ref 0.9–3.3)

## 2015-01-24 LAB — COMPREHENSIVE METABOLIC PANEL
ALBUMIN: 3.9 g/dL (ref 3.5–5.0)
ALK PHOS: 39 U/L — AB (ref 40–150)
ALT: 12 U/L (ref 0–55)
AST: 11 U/L (ref 5–34)
Anion Gap: 7 mEq/L (ref 3–11)
BUN: 9.9 mg/dL (ref 7.0–26.0)
CALCIUM: 9.1 mg/dL (ref 8.4–10.4)
CO2: 25 mEq/L (ref 22–29)
Chloride: 108 mEq/L (ref 98–109)
Creatinine: 1 mg/dL (ref 0.7–1.3)
EGFR: 88 mL/min/{1.73_m2} — AB (ref >90)
Glucose: 152 mg/dl — ABNORMAL HIGH (ref 70–140)
POTASSIUM: 4.2 meq/L (ref 3.5–5.1)
SODIUM: 141 meq/L (ref 136–145)
Total Bilirubin: 2.66 mg/dL — ABNORMAL HIGH (ref 0.20–1.20)
Total Protein: 6 g/dL — ABNORMAL LOW (ref 6.4–8.3)

## 2015-01-24 MED ORDER — OXYCODONE-ACETAMINOPHEN 5-325 MG PO TABS: 1.0000 | ORAL_TABLET | Freq: Four times a day (QID) | ORAL | Status: DC | PRN

## 2015-01-24 MED ORDER — VALACYCLOVIR HCL 500 MG PO TABS: ORAL_TABLET | ORAL | Status: DC

## 2015-01-24 NOTE — Telephone Encounter (Signed)
Gave patient avs report and appointments for January thru March. Lab on tx days only per previous pof.

## 2015-01-24 NOTE — Progress Notes (Signed)
[place] [phone]   [phone]) [phone]  OFFICE PROGRESS NOTE  [name] [address] 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  2) the venous thrombosis diagnosed in June 2016  PRIOR THERAPY: :  1. Status post 6 cycles of systemic chemotherapy with Revlimid and Decadron, last dose was given 07/21/2010 with very good response. 2. 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 [name].  3. maintenance Revlimid at 10 mg by mouth daily status post 2 months. Therapy began 01/18/2011. 4. maintenance Revlimid at 15 mg by mouth daily with prophylactic dose Coumadin at 2 mg by mouth daily. 5. Systemic chemotherapy with Velcade at 1.3 mg per meter squared given on days 1, 4, 8 and 11 and Doxil at 30 mg per meter square given on day 4 and Decadron 40 mg by mouth on weekly basis given every 3 weeks. Status post 4 cycles. 6. Zometa 4 mg IV every 4 weeks.  7. Velcade 1.3 mg/M2 subcutaneous daily on a weekly basis with Decadron 20 mg by mouth on a weekly basis. First cycle expected on 06/21/2012. S/P 4 cycles. 8. Systemic chemotherapy with Carfilzomib, cyclophosphamide and Decadron. First cycle started on 08/09/2012. He is status post 19 cycles.   CURRENT THERAPY:  1) Systemic chemotherapy with Carfilzomib 36 MG/M2 on days 1, 2, 8, 9, 15 and 16 every 4 weeks, Pomalyst 4 mg by mouth daily for 21 days every 4 weeks and Decadron 40 mg by mouth weekly. First cycle started on 04/10/2014. Status post 10 cycles. 2) Zometa every 8 weeks.  3) Xarelto 20 mg by mouth daily, started 07/17/2014.   INTERVAL HISTORY: Eddie Thomas 70 y.o. male returns to the clinic today for followup visit. He is currently on treatment with Carfilzomib, Pomalyst and dexamethasone status post 10 cycle. He is tolerating his treatment fairly well except  for mild pancytopenia, fatigue as well as elevated serum bilirubin recently. He denied having any significant weight loss or night sweats. He has no fever or chills. He has no nausea or vomiting. The patient denied having any significant chest pain, shortness of breath, cough or hemoptysis. His last myeloma panel showed no evidence for disease progression. The patient was supposed to start cycle #11 today. He is here for evaluation before starting his treatment.  MEDICAL HISTORY: Past Medical History  Diagnosis Date  . Multiple myeloma   . Diabetes mellitus ([place]) 07/03/2011  . Hypertension 07/03/2011  . Hyperlipidemia 07/03/2011  . Colon polyps 2012  . Gastric ulcer     ALLERGIES:  has No Known Allergies.  MEDICATIONS:  Current Outpatient Prescriptions  Medication Sig Dispense Refill  . Cholecalciferol (VITAMIN D-3) 5000 UNITS TABS Take 1 tablet by mouth daily.    [name] dexamethasone (DECADRON) 4 MG tablet TAKE FIVE TABLETS BY MOUTH EVERY WEEK START WITH THE FIRST DOSE OF CHEMOTHERAPY 80 tablet 0  . gabapentin (NEURONTIN) 300 MG capsule Take 300 mg by mouth 3 (three) times daily.    [name] lidocaine-prilocaine (EMLA) cream Apply 1 application topically as needed. Apply one hour before use and cover with plastic wrap. 30 g 1  . lisinopril (PRINIVIL,ZESTRIL) 10 MG tablet Take 5 mg by mouth every evening.     [name] oxyCODONE-acetaminophen (PERCOCET/ROXICET) 5-325 MG per tablet Take 1 tablet by mouth every 6 (six) hours as needed for severe pain. (Patient  not taking: Reported on 01/16/2015) 30 tablet 0  . pantoprazole (PROTONIX) 40 MG tablet TAKE ONE TABLET BY MOUTH TWICE DAILY PT  NEEDS  OFFICE  VISIT  FOR  FURTHER  REFILLS 60 tablet 0  . pioglitazone (ACTOS) 15 MG tablet Take 15 mg by mouth daily. Reported on 01/16/2015    . pomalidomide (POMALYST) 4 MG capsule TAKE 1 CAPSULE BY MOUTH ON DAYS 1-21, REPEAT EVERY 28 DAYS. Authorization number [id number] 12/26/14 adult male 21 capsule 0  . POMALYST 4 MG capsule  TAKE 1 CAPSULE BY MOUTH ON DAYS 1-21, REPEAT EVERY 28 DAYS. 21 capsule 2  . prochlorperazine (COMPAZINE) 10 MG tablet Take 10 mg by mouth every 6 (six) hours as needed for nausea or vomiting. Reported on 01/16/2015    . rivaroxaban (XARELTO) 20 MG TABS tablet Take 1 tablet (20 mg total) by mouth daily with supper. 30 tablet 1  . simvastatin (ZOCOR) 10 MG tablet Take 10 mg by mouth at bedtime.      . temazepam (RESTORIL) 15 MG capsule Take 15 mg by mouth at bedtime as needed for sleep. Reported on 01/16/2015    . valACYclovir (VALTREX) 500 MG tablet TAKE ONE CAPLET BY MOUTH ONCE DAILY 30 tablet 3  . VIAGRA 50 MG tablet Take 50 mg by mouth daily as needed for erectile dysfunction.      No current facility-administered medications for this visit.    SURGICAL HISTORY:  Past Surgical History  Procedure Laterality Date  . Limbal stem cell transplant    . Humerus fracture surgery      right  . 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    REVIEW OF SYSTEMS:  A comprehensive review of systems was negative except for: Constitutional: positive for fatigue Musculoskeletal: positive for arthralgias   PHYSICAL EXAMINATION: General appearance: alert, cooperative and no distress Head: Normocephalic, without obvious abnormality, atraumatic Neck: no adenopathy, no JVD, supple, symmetrical, trachea midline and thyroid not enlarged, symmetric, no tenderness/mass/nodules Lymph nodes: Cervical, supraclavicular, and axillary nodes normal. Resp: clear to auscultation bilaterally Back: symmetric, no curvature. ROM normal. No CVA tenderness. Cardio: regular rate and rhythm, S1, S2 normal, no murmur, click, rub or gallop GI: soft, non-tender; bowel sounds normal; no masses,  no organomegaly Extremities: extremities normal, atraumatic, no cyanosis or edema Neurologic: Alert and oriented X 3, normal strength and tone. Normal symmetric reflexes. Normal coordination and gait  ECOG PERFORMANCE  STATUS: 1 - Symptomatic but completely ambulatory  Blood pressure 150/72, pulse 65, temperature 98.6 F (37 C), temperature source Oral, resp. rate 18, height 6' 1"  (1.854 m), weight 174 lb 6.4 oz (79.107 kg), SpO2 100 %.  LABORATORY DATA: Lab Results  Component Value Date   WBC 2.0* 01/24/2015   HGB 9.0* 01/24/2015   HCT 29.5* 01/24/2015   MCV 81.5 01/24/2015   PLT 130* 01/24/2015      Chemistry      Component Value Date/Time   NA 141 01/24/2015 1322   NA 139 08/30/2013 0935   K 4.2 01/24/2015 1322   K 4.1 08/30/2013 0935   CL 101 08/30/2013 0935   CL 103 07/12/2012 0907   CO2 25 01/24/2015 1322   CO2 26 08/30/2013 0935   BUN 9.9 01/24/2015 1322   BUN 11 08/30/2013 0935   CREATININE 1.0 01/24/2015 1322   CREATININE 1.03 08/30/2013 0935      Component Value Date/Time   CALCIUM 9.1 01/24/2015 1322   CALCIUM 9.2 08/30/2013 0935  ALKPHOS 39* 01/24/2015 1322   ALKPHOS 39 08/30/2013 0935   AST 11 01/24/2015 1322   AST 13 08/30/2013 0935   ALT 12 01/24/2015 1322   ALT 9 08/30/2013 0935   BILITOT 2.66* 01/24/2015 1322   BILITOT 1.3* 08/30/2013 0935     Other lab results: Beta-2 microglobulin 2.81, free kappa light chain 0.03, free lambda light chain 5.78 with a kappa/lambda ratio of 0.01. IgG 106, IgA 398 and IgM less than 5.  RADIOGRAPHIC STUDIES: No results found.  ASSESSMENT AND PLAN: This is a very pleasant 70 years old [place] American male with history of multiple myeloma currently undergoing systemic chemotherapy with Carfilzomib, cyclophosphamide and Decadron status post 19 cycles. This was discontinued secondary to disease progression. The patient was started on systemic chemotherapy with Carfilzomib, Pomalyst and Decadron status post 10 cycles. Unfortunately his absolute neutrophil count is low today. He also has elevated serum bilirubin. I recommended for the patient to delay the start of cycle #11 by 2 more weeks to give him time to recover from the adverse  effect of this continuous treatment. He is also interested in taking a break off treatment at some point in the future if his disease is well-controlled. The patient would come back for follow-up visit in one month with the start of cycle #12. He'll continue on Zometa every 2 months. For the venous thrombosis, he will continue on Xarelto 20 mg by mouth daily. He was advised to call immediately if he has any concerning symptoms in the interval.  The patient voices understanding of current disease status and treatment options and is in agreement with the current care plan. All questions were answered. The patient knows to call the clinic with any problems, questions or concerns. We can certainly see the patient much sooner if necessary.  Disclaimer: This note was dictated with voice recognition software. Similar sounding words can inadvertently be transcribed and may not be corrected upon review.

## 2015-01-25 ENCOUNTER — Telehealth: Payer: Self-pay | Admitting: Medical Oncology

## 2015-01-25 ENCOUNTER — Telehealth: Payer: Self-pay | Admitting: *Deleted

## 2015-01-25 DIAGNOSIS — C9000 Multiple myeloma not having achieved remission: Secondary | ICD-10-CM

## 2015-01-25 MED ORDER — POMALIDOMIDE 4 MG PO CAPS: ORAL_CAPSULE | ORAL | Status: DC

## 2015-01-25 NOTE — Telephone Encounter (Signed)
Need med clearance to stop xarelto for 3 days prior to back injection 01/30/14. Note to [place].

## 2015-01-25 NOTE — Telephone Encounter (Signed)
Dyanne Carrel with [facility] called.  "We received Pomalyst order without an authorization number." Authorization number obtained and given to Pharmacy Tech Krisien at this time.  Patient also needs to take survey.  Called, received patient identified voicemail.  Message left with [employer] number with instructions to call and take survey.

## 2015-01-26 ENCOUNTER — Telehealth: Payer: Self-pay | Admitting: Medical Oncology

## 2015-01-26 NOTE — Telephone Encounter (Signed)
Called to [place].

## 2015-01-26 NOTE — Telephone Encounter (Signed)
-----   Message from Curt Bears, MD sent at 01/25/2015  5:43 PM EST ----- Regarding: RE: off xarelto 3 days prior to back injection? Yes. ----- Message -----    From: Ardeen Garland, RN    Sent: 01/25/2015   4:27 PM      To: Curt Bears, MD Subject: off xarelto 3 days prior to back injection?    Okay to  stop xarelto for 3 days prior to back injection 01/30/14 ?

## 2015-01-29 ENCOUNTER — Ambulatory Visit: Payer: Medicare Other

## 2015-01-29 ENCOUNTER — Other Ambulatory Visit: Payer: Medicare Other

## 2015-01-30 ENCOUNTER — Ambulatory Visit: Payer: Medicare Other

## 2015-02-02 ENCOUNTER — Ambulatory Visit (INDEPENDENT_AMBULATORY_CARE_PROVIDER_SITE_OTHER): Payer: Medicare Other | Admitting: Gastroenterology

## 2015-02-02 ENCOUNTER — Encounter: Payer: Self-pay | Admitting: Gastroenterology

## 2015-02-02 VITALS — BP 136/70 | HR 60 | Ht 70.5 in | Wt 170.1 lb

## 2015-02-02 DIAGNOSIS — Z1211 Encounter for screening for malignant neoplasm of colon: Secondary | ICD-10-CM

## 2015-02-02 DIAGNOSIS — K2100 Gastro-esophageal reflux disease with esophagitis, without bleeding: Secondary | ICD-10-CM | POA: Insufficient documentation

## 2015-02-02 MED ORDER — PANTOPRAZOLE SODIUM 40 MG PO TBEC: DELAYED_RELEASE_TABLET | ORAL | Status: AC

## 2015-02-02 NOTE — Assessment & Plan Note (Signed)
Continue pantoprazole 40 mg twice daily and standard antireflux measures. REV in 1 year.

## 2015-02-02 NOTE — Patient Instructions (Signed)
We have sent the following medications to your pharmacy for you to pick up at your convenience:Protonix.  We will get your prior Colonoscopy from Dr. Ruthell Rummage office. We would like for you to follow up with EITHER [place] OR Dr Ruthell Rummage in the future for continuity of care. We have your best interest at heart and we are unable to care for you effectively if you are seeing multiple Gastroenterologists.  Thank you for choosing me and [facility].  Pricilla Riffle. Dagoberto Ligas., MD., Marval Regal

## 2015-02-02 NOTE — Progress Notes (Signed)
    History of Present Illness: This is a 70 year old male returning for follow-up of GERD with esophagitis. He is undergoing treatment for multiple myeloma by Dr. Earlie Server. His reflux symptoms are under good control on twice daily pantoprazole. He underwent colonoscopy in June 2013 by Dr. Ferdinand Lango and 1 hyperplastic polyp was removed however the prep was poor and the patient was advised to have colonoscopy repeated. He states he return to see Dr. Terance Hart had a colonoscopy done in 2014 or 2015 and was advised to have a repeat exam done in 2020. Unfortunately we do not have these records.  Current Medications, Allergies, Past Medical History, Past Surgical History, Family History and Social History were reviewed in [employer] record.  Physical Exam: General: Well developed, well nourished, no acute distress Head: Normocephalic and atraumatic Eyes:  sclerae anicteric, EOMI Ears: Normal auditory acuity Mouth: No deformity or lesions Lungs: Clear throughout to auscultation Heart: Regular rate and rhythm; no murmurs, rubs or bruits Abdomen: Soft, non tender and non distended. No masses, hepatosplenomegaly or hernias noted. Normal Bowel sounds Musculoskeletal: Symmetrical with no gross deformities  Pulses:  Normal pulses noted Extremities: No clubbing, cyanosis, edema or deformities noted Neurological: Alert oriented x 4, grossly nonfocal Psychological:  Alert and cooperative. Normal mood and affect  Assessment and Recommendations:  1. CRC screening. Patient reports having colonoscopies performed by Dr. Ferdinand Lango in 2013 and ? 2015. We will request those records. I advised the patient to consolidate his GI care with only 1 local gastroenterologist-either me or Dr. Ferdinand Lango.  Pt will decide and let us know, I spent 15 minutes of face-to-face time with the patient. Greater than 50% of the time was spent counseling and coordinating care.

## 2015-02-05 ENCOUNTER — Other Ambulatory Visit (HOSPITAL_BASED_OUTPATIENT_CLINIC_OR_DEPARTMENT_OTHER): Payer: Medicare Other

## 2015-02-05 ENCOUNTER — Other Ambulatory Visit: Payer: Self-pay | Admitting: Medical Oncology

## 2015-02-05 ENCOUNTER — Ambulatory Visit (HOSPITAL_BASED_OUTPATIENT_CLINIC_OR_DEPARTMENT_OTHER): Payer: Medicare Other

## 2015-02-05 VITALS — BP 140/75 | HR 57 | Temp 97.7°F | Resp 16

## 2015-02-05 DIAGNOSIS — Z5112 Encounter for antineoplastic immunotherapy: Secondary | ICD-10-CM

## 2015-02-05 DIAGNOSIS — C9000 Multiple myeloma not having achieved remission: Secondary | ICD-10-CM

## 2015-02-05 LAB — CBC WITH DIFFERENTIAL/PLATELET
BASO%: 1.7 % (ref 0.0–2.0)
BASOS ABS: 0 10*3/uL (ref 0.0–0.1)
EOS%: 0.6 % (ref 0.0–7.0)
Eosinophils Absolute: 0 10*3/uL (ref 0.0–0.5)
HEMATOCRIT: 31.4 % — AB (ref 38.4–49.9)
HEMOGLOBIN: 9.6 g/dL — AB (ref 13.0–17.1)
LYMPH#: 0.8 10*3/uL — AB (ref 0.9–3.3)
LYMPH%: 30.3 % (ref 14.0–49.0)
MCH: 25.4 pg — ABNORMAL LOW (ref 27.2–33.4)
MCHC: 30.6 g/dL — ABNORMAL LOW (ref 32.0–36.0)
MCV: 82.8 fL (ref 79.3–98.0)
MONO#: 0.3 10*3/uL (ref 0.1–0.9)
MONO%: 11.6 % (ref 0.0–14.0)
NEUT%: 55.8 % (ref 39.0–75.0)
NEUTROS ABS: 1.5 10*3/uL (ref 1.5–6.5)
Platelets: 165 10*3/uL (ref 140–400)
RBC: 3.79 10*6/uL — ABNORMAL LOW (ref 4.20–5.82)
RDW: 16.5 % — ABNORMAL HIGH (ref 11.0–14.6)
WBC: 2.8 10*3/uL — ABNORMAL LOW (ref 4.0–10.3)

## 2015-02-05 LAB — COMPREHENSIVE METABOLIC PANEL
ALBUMIN: 4 g/dL (ref 3.5–5.0)
ALK PHOS: 39 U/L — AB (ref 40–150)
ALT: 12 U/L (ref 0–55)
AST: 13 U/L (ref 5–34)
Anion Gap: 10 mEq/L (ref 3–11)
BUN: 13 mg/dL (ref 7.0–26.0)
CALCIUM: 9.1 mg/dL (ref 8.4–10.4)
CO2: 23 mEq/L (ref 22–29)
CREATININE: 1.1 mg/dL (ref 0.7–1.3)
Chloride: 106 mEq/L (ref 98–109)
EGFR: 83 mL/min/{1.73_m2} — ABNORMAL LOW (ref >90)
GLUCOSE: 126 mg/dL (ref 70–140)
Potassium: 4.1 mEq/L (ref 3.5–5.1)
SODIUM: 139 meq/L (ref 136–145)
Total Bilirubin: 1.84 mg/dL — ABNORMAL HIGH (ref 0.20–1.20)
Total Protein: 6.4 g/dL (ref 6.4–8.3)

## 2015-02-05 LAB — TECHNOLOGIST REVIEW

## 2015-02-05 MED ORDER — HEPARIN SOD (PORK) LOCK FLUSH 100 UNIT/ML IV SOLN
500.0000 [IU] | Freq: Once | INTRAVENOUS | Status: AC | PRN
  Administered 2015-02-05: 500 [IU]
  Filled 2015-02-05: qty 5

## 2015-02-05 MED ORDER — SODIUM CHLORIDE 0.9 % IV SOLN
Freq: Once | INTRAVENOUS | Status: AC
  Administered 2015-02-05: 09:00:00 via INTRAVENOUS

## 2015-02-05 MED ORDER — SODIUM CHLORIDE 0.9 % IV SOLN
Freq: Once | INTRAVENOUS | Status: AC
  Administered 2015-02-05: 10:00:00 via INTRAVENOUS
  Filled 2015-02-05: qty 4

## 2015-02-05 MED ORDER — SODIUM CHLORIDE 0.9 % IJ SOLN
10.0000 mL | INTRAMUSCULAR | Status: DC | PRN
  Administered 2015-02-05: 10 mL
  Filled 2015-02-05: qty 10

## 2015-02-05 MED ORDER — DEXTROSE 5 % IV SOLN
36.0000 mg/m2 | Freq: Once | INTRAVENOUS | Status: AC
  Administered 2015-02-05: 72 mg via INTRAVENOUS
  Filled 2015-02-05: qty 36

## 2015-02-05 NOTE — Patient Instructions (Signed)
[facility] Discharge Instructions for Patients Receiving Chemotherapy  Today you received the following chemotherapy agents:  Kyprolis.  To help prevent nausea and vomiting after your treatment, we encourage you to take your nausea medication: compazine 10 mg every 6 hours as needed.   If you develop nausea and vomiting that is not controlled by your nausea medication, call the clinic.   BELOW ARE SYMPTOMS THAT SHOULD BE REPORTED IMMEDIATELY:  *FEVER GREATER THAN 100.5 F  *CHILLS WITH OR WITHOUT FEVER  NAUSEA AND VOMITING THAT IS NOT CONTROLLED WITH YOUR NAUSEA MEDICATION  *UNUSUAL SHORTNESS OF BREATH  *UNUSUAL BRUISING OR BLEEDING  TENDERNESS IN MOUTH AND THROAT WITH OR WITHOUT PRESENCE OF ULCERS  *URINARY PROBLEMS  *BOWEL PROBLEMS  UNUSUAL RASH Items with * indicate a potential emergency and should be followed up as soon as possible.  Feel free to call the clinic you have any questions or concerns. The clinic phone number is [phone].  Please show the [place] at check-in to the Emergency Department and triage nurse.

## 2015-02-05 NOTE — Progress Notes (Signed)
Reviewed labs. T. Bili is 1.84 this am. OK to treat per Dr. Julien Nordmann.

## 2015-02-06 ENCOUNTER — Telehealth: Payer: Self-pay | Admitting: Gastroenterology

## 2015-02-06 ENCOUNTER — Ambulatory Visit (HOSPITAL_BASED_OUTPATIENT_CLINIC_OR_DEPARTMENT_OTHER): Payer: Medicare Other

## 2015-02-06 VITALS — BP 123/68 | HR 57 | Temp 98.0°F | Resp 17

## 2015-02-06 DIAGNOSIS — Z5112 Encounter for antineoplastic immunotherapy: Secondary | ICD-10-CM | POA: Diagnosis present

## 2015-02-06 DIAGNOSIS — C9000 Multiple myeloma not having achieved remission: Secondary | ICD-10-CM | POA: Diagnosis not present

## 2015-02-06 MED ORDER — SODIUM CHLORIDE 0.9 % IV SOLN
Freq: Once | INTRAVENOUS | Status: AC
  Administered 2015-02-06: 09:00:00 via INTRAVENOUS
  Filled 2015-02-06: qty 4

## 2015-02-06 MED ORDER — SODIUM CHLORIDE 0.9 % IV SOLN: Freq: Once | INTRAVENOUS | Status: DC

## 2015-02-06 MED ORDER — DEXTROSE 5 % IV SOLN
36.0000 mg/m2 | Freq: Once | INTRAVENOUS | Status: AC
  Administered 2015-02-06: 72 mg via INTRAVENOUS
  Filled 2015-02-06: qty 36

## 2015-02-06 MED ORDER — SODIUM CHLORIDE 0.9 % IJ SOLN
10.0000 mL | INTRAMUSCULAR | Status: DC | PRN
  Administered 2015-02-06: 10 mL
  Filled 2015-02-06: qty 10

## 2015-02-06 MED ORDER — HEPARIN SOD (PORK) LOCK FLUSH 100 UNIT/ML IV SOLN
500.0000 [IU] | Freq: Once | INTRAVENOUS | Status: AC | PRN
  Administered 2015-02-06: 500 [IU]
  Filled 2015-02-06: qty 5

## 2015-02-06 MED ORDER — SODIUM CHLORIDE 0.9 % IV SOLN
Freq: Once | INTRAVENOUS | Status: AC
  Administered 2015-02-06: 09:00:00 via INTRAVENOUS

## 2015-02-06 NOTE — Telephone Encounter (Signed)
Informed patient we sent it at his last office visit but I can resend it if need be. Patient states they did receive at I do not need to send it again.

## 2015-02-06 NOTE — Patient Instructions (Signed)
[facility] Discharge Instructions for Patients Receiving Chemotherapy  Today you received the following chemotherapy agents: Kyprolis   To help prevent nausea and vomiting after your treatment, we encourage you to take your nausea medication as directed.    If you develop nausea and vomiting that is not controlled by your nausea medication, call the clinic.   BELOW ARE SYMPTOMS THAT SHOULD BE REPORTED IMMEDIATELY:  *FEVER GREATER THAN 100.5 F  *CHILLS WITH OR WITHOUT FEVER  NAUSEA AND VOMITING THAT IS NOT CONTROLLED WITH YOUR NAUSEA MEDICATION  *UNUSUAL SHORTNESS OF BREATH  *UNUSUAL BRUISING OR BLEEDING  TENDERNESS IN MOUTH AND THROAT WITH OR WITHOUT PRESENCE OF ULCERS  *URINARY PROBLEMS  *BOWEL PROBLEMS  UNUSUAL RASH Items with * indicate a potential emergency and should be followed up as soon as possible.  Feel free to call the clinic you have any questions or concerns. The clinic phone number is [phone].  Please show the CHEMO ALERT CARD at check-in to the Emergency Department and triage nurse.   

## 2015-02-09 ENCOUNTER — Other Ambulatory Visit: Payer: Self-pay | Admitting: Internal Medicine

## 2015-02-09 DIAGNOSIS — C9000 Multiple myeloma not having achieved remission: Secondary | ICD-10-CM

## 2015-02-12 ENCOUNTER — Other Ambulatory Visit: Payer: Self-pay | Admitting: Internal Medicine

## 2015-02-12 ENCOUNTER — Ambulatory Visit (HOSPITAL_BASED_OUTPATIENT_CLINIC_OR_DEPARTMENT_OTHER): Payer: Medicare Other

## 2015-02-12 ENCOUNTER — Other Ambulatory Visit (HOSPITAL_BASED_OUTPATIENT_CLINIC_OR_DEPARTMENT_OTHER): Payer: Medicare Other

## 2015-02-12 VITALS — BP 149/67 | HR 60 | Temp 98.0°F | Resp 18

## 2015-02-12 DIAGNOSIS — C9000 Multiple myeloma not having achieved remission: Secondary | ICD-10-CM

## 2015-02-12 DIAGNOSIS — Z5112 Encounter for antineoplastic immunotherapy: Secondary | ICD-10-CM | POA: Diagnosis present

## 2015-02-12 LAB — COMPREHENSIVE METABOLIC PANEL
ALBUMIN: 3.8 g/dL (ref 3.5–5.0)
ALK PHOS: 44 U/L (ref 40–150)
ALT: 11 U/L (ref 0–55)
AST: 10 U/L (ref 5–34)
Anion Gap: 9 mEq/L (ref 3–11)
BILIRUBIN TOTAL: 1.76 mg/dL — AB (ref 0.20–1.20)
BUN: 10.3 mg/dL (ref 7.0–26.0)
CO2: 24 meq/L (ref 22–29)
CREATININE: 1 mg/dL (ref 0.7–1.3)
Calcium: 9.3 mg/dL (ref 8.4–10.4)
Chloride: 106 mEq/L (ref 98–109)
EGFR: 89 mL/min/{1.73_m2} — AB (ref >90)
GLUCOSE: 157 mg/dL — AB (ref 70–140)
Potassium: 4 mEq/L (ref 3.5–5.1)
SODIUM: 139 meq/L (ref 136–145)
TOTAL PROTEIN: 6.2 g/dL — AB (ref 6.4–8.3)

## 2015-02-12 LAB — CBC WITH DIFFERENTIAL/PLATELET
BASO%: 0 % (ref 0.0–2.0)
BASOS ABS: 0 10*3/uL (ref 0.0–0.1)
EOS ABS: 0.1 10*3/uL (ref 0.0–0.5)
EOS%: 2.8 % (ref 0.0–7.0)
HCT: 31.2 % — ABNORMAL LOW (ref 38.4–49.9)
HEMOGLOBIN: 9.7 g/dL — AB (ref 13.0–17.1)
LYMPH#: 1.3 10*3/uL (ref 0.9–3.3)
LYMPH%: 25.6 % (ref 14.0–49.0)
MCH: 25.3 pg — ABNORMAL LOW (ref 27.2–33.4)
MCHC: 31.1 g/dL — ABNORMAL LOW (ref 32.0–36.0)
MCV: 81.3 fL (ref 79.3–98.0)
MONO#: 0.4 10*3/uL (ref 0.1–0.9)
MONO%: 7.9 % (ref 0.0–14.0)
NEUT%: 63.7 % (ref 39.0–75.0)
NEUTROS ABS: 3.2 10*3/uL (ref 1.5–6.5)
NRBC: 0 % (ref 0–0)
Platelets: 90 10*3/uL — ABNORMAL LOW (ref 140–400)
RBC: 3.84 10*6/uL — AB (ref 4.20–5.82)
RDW: 15.2 % — AB (ref 11.0–14.6)
WBC: 5 10*3/uL (ref 4.0–10.3)

## 2015-02-12 LAB — TECHNOLOGIST REVIEW

## 2015-02-12 MED ORDER — SODIUM CHLORIDE 0.9 % IJ SOLN
10.0000 mL | INTRAMUSCULAR | Status: DC | PRN
  Administered 2015-02-12: 10 mL
  Filled 2015-02-12: qty 10

## 2015-02-12 MED ORDER — CARFILZOMIB CHEMO INJECTION 60 MG
36.0000 mg/m2 | Freq: Once | INTRAVENOUS | Status: AC
  Administered 2015-02-12: 72 mg via INTRAVENOUS
  Filled 2015-02-12: qty 36

## 2015-02-12 MED ORDER — SODIUM CHLORIDE 0.9 % IV SOLN
Freq: Once | INTRAVENOUS | Status: AC
  Administered 2015-02-12: 09:00:00 via INTRAVENOUS

## 2015-02-12 MED ORDER — HEPARIN SOD (PORK) LOCK FLUSH 100 UNIT/ML IV SOLN
500.0000 [IU] | Freq: Once | INTRAVENOUS | Status: AC | PRN
  Administered 2015-02-12: 500 [IU]
  Filled 2015-02-12: qty 5

## 2015-02-12 MED ORDER — SODIUM CHLORIDE 0.9 % IV SOLN
Freq: Once | INTRAVENOUS | Status: AC
  Administered 2015-02-12: 09:00:00 via INTRAVENOUS
  Filled 2015-02-12: qty 4

## 2015-02-12 NOTE — Patient Instructions (Signed)
[facility] Discharge Instructions for Patients Receiving Chemotherapy  Today you received the following chemotherapy agents: Kyprolis   To help prevent nausea and vomiting after your treatment, we encourage you to take your nausea medication as directed.    If you develop nausea and vomiting that is not controlled by your nausea medication, call the clinic.   BELOW ARE SYMPTOMS THAT SHOULD BE REPORTED IMMEDIATELY:  *FEVER GREATER THAN 100.5 F  *CHILLS WITH OR WITHOUT FEVER  NAUSEA AND VOMITING THAT IS NOT CONTROLLED WITH YOUR NAUSEA MEDICATION  *UNUSUAL SHORTNESS OF BREATH  *UNUSUAL BRUISING OR BLEEDING  TENDERNESS IN MOUTH AND THROAT WITH OR WITHOUT PRESENCE OF ULCERS  *URINARY PROBLEMS  *BOWEL PROBLEMS  UNUSUAL RASH Items with * indicate a potential emergency and should be followed up as soon as possible.  Feel free to call the clinic you have any questions or concerns. The clinic phone number is [phone].  Please show the CHEMO ALERT CARD at check-in to the Emergency Department and triage nurse.   

## 2015-02-12 NOTE — Progress Notes (Signed)
Ok to treat today and tomorrow with Platelets 90 and Bilirubin 1.76 per Dr. Julien Nordmann.

## 2015-02-13 ENCOUNTER — Ambulatory Visit (HOSPITAL_BASED_OUTPATIENT_CLINIC_OR_DEPARTMENT_OTHER): Payer: Medicare Other

## 2015-02-13 VITALS — BP 144/74 | HR 52 | Temp 97.8°F | Resp 18

## 2015-02-13 DIAGNOSIS — Z5112 Encounter for antineoplastic immunotherapy: Secondary | ICD-10-CM | POA: Diagnosis present

## 2015-02-13 DIAGNOSIS — C9000 Multiple myeloma not having achieved remission: Secondary | ICD-10-CM

## 2015-02-13 MED ORDER — SODIUM CHLORIDE 0.9 % IJ SOLN
10.0000 mL | INTRAMUSCULAR | Status: DC | PRN
Start: 2015-02-13 — End: 2015-02-13
  Administered 2015-02-13: 10 mL
  Filled 2015-02-13: qty 10

## 2015-02-13 MED ORDER — DEXTROSE 5 % IV SOLN
36.0000 mg/m2 | Freq: Once | INTRAVENOUS | Status: AC
  Administered 2015-02-13: 72 mg via INTRAVENOUS
  Filled 2015-02-13: qty 36

## 2015-02-13 MED ORDER — HEPARIN SOD (PORK) LOCK FLUSH 100 UNIT/ML IV SOLN
500.0000 [IU] | Freq: Once | INTRAVENOUS | Status: AC | PRN
  Administered 2015-02-13: 500 [IU]
  Filled 2015-02-13: qty 5

## 2015-02-13 MED ORDER — SODIUM CHLORIDE 0.9 % IV SOLN
Freq: Once | INTRAVENOUS | Status: AC
  Administered 2015-02-13: 09:00:00 via INTRAVENOUS

## 2015-02-13 MED ORDER — SODIUM CHLORIDE 0.9 % IV SOLN
Freq: Once | INTRAVENOUS | Status: AC
  Administered 2015-02-13: 09:00:00 via INTRAVENOUS
  Filled 2015-02-13: qty 4

## 2015-02-13 MED ORDER — SODIUM CHLORIDE 0.9 % IV SOLN: Freq: Once | INTRAVENOUS | Status: DC

## 2015-02-13 NOTE — Patient Instructions (Signed)
[facility] Discharge Instructions for Patients Receiving Chemotherapy  Today you received the following chemotherapy agents: Kyprolis   To help prevent nausea and vomiting after your treatment, we encourage you to take your nausea medication as directed.    If you develop nausea and vomiting that is not controlled by your nausea medication, call the clinic.   BELOW ARE SYMPTOMS THAT SHOULD BE REPORTED IMMEDIATELY:  *FEVER GREATER THAN 100.5 F  *CHILLS WITH OR WITHOUT FEVER  NAUSEA AND VOMITING THAT IS NOT CONTROLLED WITH YOUR NAUSEA MEDICATION  *UNUSUAL SHORTNESS OF BREATH  *UNUSUAL BRUISING OR BLEEDING  TENDERNESS IN MOUTH AND THROAT WITH OR WITHOUT PRESENCE OF ULCERS  *URINARY PROBLEMS  *BOWEL PROBLEMS  UNUSUAL RASH Items with * indicate a potential emergency and should be followed up as soon as possible.  Feel free to call the clinic you have any questions or concerns. The clinic phone number is [phone].  Please show the CHEMO ALERT CARD at check-in to the Emergency Department and triage nurse.   

## 2015-02-16 ENCOUNTER — Telehealth: Payer: Self-pay | Admitting: *Deleted

## 2015-02-16 NOTE — Telephone Encounter (Signed)
Call placed to [facility] pharmacy, authorization number given to Pharmacy tech. rep in Pharmacy department. Auth# [id number]. Shipment to customer will be confirmed with pt by [facility].

## 2015-02-19 ENCOUNTER — Ambulatory Visit (HOSPITAL_BASED_OUTPATIENT_CLINIC_OR_DEPARTMENT_OTHER): Payer: Medicare Other

## 2015-02-19 ENCOUNTER — Other Ambulatory Visit (HOSPITAL_BASED_OUTPATIENT_CLINIC_OR_DEPARTMENT_OTHER): Payer: Medicare Other

## 2015-02-19 ENCOUNTER — Other Ambulatory Visit: Payer: Self-pay | Admitting: Internal Medicine

## 2015-02-19 VITALS — BP 150/65 | HR 57 | Temp 97.7°F | Resp 16

## 2015-02-19 DIAGNOSIS — C9000 Multiple myeloma not having achieved remission: Secondary | ICD-10-CM

## 2015-02-19 DIAGNOSIS — Z5112 Encounter for antineoplastic immunotherapy: Secondary | ICD-10-CM

## 2015-02-19 LAB — COMPREHENSIVE METABOLIC PANEL
ALBUMIN: 3.8 g/dL (ref 3.5–5.0)
ALT: 16 U/L (ref 0–55)
AST: 16 U/L (ref 5–34)
Alkaline Phosphatase: 43 U/L (ref 40–150)
Anion Gap: 12 mEq/L — ABNORMAL HIGH (ref 3–11)
BUN: 8.9 mg/dL (ref 7.0–26.0)
CALCIUM: 9.2 mg/dL (ref 8.4–10.4)
CHLORIDE: 107 meq/L (ref 98–109)
CO2: 20 meq/L — AB (ref 22–29)
Creatinine: 1 mg/dL (ref 0.7–1.3)
EGFR: 85 mL/min/{1.73_m2} — AB (ref >90)
GLUCOSE: 171 mg/dL — AB (ref 70–140)
POTASSIUM: 4.4 meq/L (ref 3.5–5.1)
SODIUM: 139 meq/L (ref 136–145)
TOTAL PROTEIN: 6.4 g/dL (ref 6.4–8.3)
Total Bilirubin: 1.93 mg/dL — ABNORMAL HIGH (ref 0.20–1.20)

## 2015-02-19 LAB — CBC WITH DIFFERENTIAL/PLATELET
BASO%: 0.7 % (ref 0.0–2.0)
BASOS ABS: 0 10*3/uL (ref 0.0–0.1)
EOS%: 3.3 % (ref 0.0–7.0)
Eosinophils Absolute: 0.2 10*3/uL (ref 0.0–0.5)
HEMATOCRIT: 31.3 % — AB (ref 38.4–49.9)
HEMOGLOBIN: 9.7 g/dL — AB (ref 13.0–17.1)
LYMPH#: 0.9 10*3/uL (ref 0.9–3.3)
LYMPH%: 19.1 % (ref 14.0–49.0)
MCH: 25 pg — AB (ref 27.2–33.4)
MCHC: 30.9 g/dL — ABNORMAL LOW (ref 32.0–36.0)
MCV: 81.1 fL (ref 79.3–98.0)
MONO#: 0.6 10*3/uL (ref 0.1–0.9)
MONO%: 12.3 % (ref 0.0–14.0)
NEUT%: 64.6 % (ref 39.0–75.0)
NEUTROS ABS: 3 10*3/uL (ref 1.5–6.5)
Platelets: 107 10*3/uL — ABNORMAL LOW (ref 140–400)
RBC: 3.86 10*6/uL — ABNORMAL LOW (ref 4.20–5.82)
RDW: 15.4 % — AB (ref 11.0–14.6)
WBC: 4.7 10*3/uL (ref 4.0–10.3)

## 2015-02-19 MED ORDER — DEXTROSE 5 % IV SOLN
36.0000 mg/m2 | Freq: Once | INTRAVENOUS | Status: AC
  Administered 2015-02-19: 72 mg via INTRAVENOUS
  Filled 2015-02-19: qty 36

## 2015-02-19 MED ORDER — SODIUM CHLORIDE 0.9 % IJ SOLN
10.0000 mL | INTRAMUSCULAR | Status: DC | PRN
  Administered 2015-02-19: 10 mL
  Filled 2015-02-19: qty 10

## 2015-02-19 MED ORDER — SODIUM CHLORIDE 0.9 % IV SOLN
Freq: Once | INTRAVENOUS | Status: AC
  Administered 2015-02-19: 11:00:00 via INTRAVENOUS
  Filled 2015-02-19: qty 4

## 2015-02-19 MED ORDER — HEPARIN SOD (PORK) LOCK FLUSH 100 UNIT/ML IV SOLN
500.0000 [IU] | Freq: Once | INTRAVENOUS | Status: AC | PRN
  Administered 2015-02-19: 500 [IU]
  Filled 2015-02-19: qty 5

## 2015-02-19 MED ORDER — SODIUM CHLORIDE 0.9 % IV SOLN
Freq: Once | INTRAVENOUS | Status: AC
  Administered 2015-02-19: 10:00:00 via INTRAVENOUS

## 2015-02-19 NOTE — Progress Notes (Signed)
Reviewed labs. Total bilirubin 1.93.  Ok to treat per Dr. Julien Nordmann.

## 2015-02-19 NOTE — Patient Instructions (Signed)
[facility] Discharge Instructions for Patients Receiving Chemotherapy  Today you received the following chemotherapy agents: Kyprolis   To help prevent nausea and vomiting after your treatment, we encourage you to take your nausea medication as directed.    If you develop nausea and vomiting that is not controlled by your nausea medication, call the clinic.   BELOW ARE SYMPTOMS THAT SHOULD BE REPORTED IMMEDIATELY:  *FEVER GREATER THAN 100.5 F  *CHILLS WITH OR WITHOUT FEVER  NAUSEA AND VOMITING THAT IS NOT CONTROLLED WITH YOUR NAUSEA MEDICATION  *UNUSUAL SHORTNESS OF BREATH  *UNUSUAL BRUISING OR BLEEDING  TENDERNESS IN MOUTH AND THROAT WITH OR WITHOUT PRESENCE OF ULCERS  *URINARY PROBLEMS  *BOWEL PROBLEMS  UNUSUAL RASH Items with * indicate a potential emergency and should be followed up as soon as possible.  Feel free to call the clinic you have any questions or concerns. The clinic phone number is [phone].  Please show the CHEMO ALERT CARD at check-in to the Emergency Department and triage nurse.   

## 2015-02-20 ENCOUNTER — Ambulatory Visit (HOSPITAL_BASED_OUTPATIENT_CLINIC_OR_DEPARTMENT_OTHER): Payer: Medicare Other

## 2015-02-20 VITALS — BP 151/73 | HR 56 | Temp 97.5°F | Resp 20

## 2015-02-20 DIAGNOSIS — C9000 Multiple myeloma not having achieved remission: Secondary | ICD-10-CM

## 2015-02-20 DIAGNOSIS — Z5112 Encounter for antineoplastic immunotherapy: Secondary | ICD-10-CM

## 2015-02-20 MED ORDER — SODIUM CHLORIDE 0.9 % IV SOLN
Freq: Once | INTRAVENOUS | Status: AC
  Administered 2015-02-20: 09:00:00 via INTRAVENOUS

## 2015-02-20 MED ORDER — DEXTROSE 5 % IV SOLN
36.0000 mg/m2 | Freq: Once | INTRAVENOUS | Status: AC
  Administered 2015-02-20: 72 mg via INTRAVENOUS
  Filled 2015-02-20: qty 36

## 2015-02-20 MED ORDER — SODIUM CHLORIDE 0.9 % IV SOLN
Freq: Once | INTRAVENOUS | Status: AC
  Administered 2015-02-20: 09:00:00 via INTRAVENOUS
  Filled 2015-02-20: qty 4

## 2015-02-20 MED ORDER — SODIUM CHLORIDE 0.9 % IJ SOLN
10.0000 mL | INTRAMUSCULAR | Status: DC | PRN
  Administered 2015-02-20: 10 mL
  Filled 2015-02-20: qty 10

## 2015-02-20 MED ORDER — HEPARIN SOD (PORK) LOCK FLUSH 100 UNIT/ML IV SOLN
500.0000 [IU] | Freq: Once | INTRAVENOUS | Status: AC | PRN
  Administered 2015-02-20: 500 [IU]
  Filled 2015-02-20: qty 5

## 2015-02-20 NOTE — Patient Instructions (Signed)
[facility] Discharge Instructions for Patients Receiving Chemotherapy  Today you received the following chemotherapy agents:  Kyprolis  To help prevent nausea and vomiting after your treatment, we encourage you to take your nausea medication as ordered per MD.   If you develop nausea and vomiting that is not controlled by your nausea medication, call the clinic.   BELOW ARE SYMPTOMS THAT SHOULD BE REPORTED IMMEDIATELY:  *FEVER GREATER THAN 100.5 F  *CHILLS WITH OR WITHOUT FEVER  NAUSEA AND VOMITING THAT IS NOT CONTROLLED WITH YOUR NAUSEA MEDICATION  *UNUSUAL SHORTNESS OF BREATH  *UNUSUAL BRUISING OR BLEEDING  TENDERNESS IN MOUTH AND THROAT WITH OR WITHOUT PRESENCE OF ULCERS  *URINARY PROBLEMS  *BOWEL PROBLEMS  UNUSUAL RASH Items with * indicate a potential emergency and should be followed up as soon as possible.  Feel free to call the clinic you have any questions or concerns. The clinic phone number is [phone].  Please show the CHEMO ALERT CARD at check-in to the Emergency Department and triage nurse.   

## 2015-02-28 ENCOUNTER — Other Ambulatory Visit: Payer: Self-pay | Admitting: Internal Medicine

## 2015-03-05 ENCOUNTER — Ambulatory Visit (HOSPITAL_BASED_OUTPATIENT_CLINIC_OR_DEPARTMENT_OTHER): Payer: Medicare Other | Admitting: Internal Medicine

## 2015-03-05 ENCOUNTER — Telehealth: Payer: Self-pay | Admitting: Internal Medicine

## 2015-03-05 ENCOUNTER — Other Ambulatory Visit (HOSPITAL_BASED_OUTPATIENT_CLINIC_OR_DEPARTMENT_OTHER): Payer: Medicare Other

## 2015-03-05 ENCOUNTER — Encounter: Payer: Self-pay | Admitting: Internal Medicine

## 2015-03-05 ENCOUNTER — Ambulatory Visit (HOSPITAL_BASED_OUTPATIENT_CLINIC_OR_DEPARTMENT_OTHER): Payer: Medicare Other

## 2015-03-05 VITALS — BP 154/79 | HR 60 | Temp 98.0°F | Resp 18 | Ht 70.5 in | Wt 172.0 lb

## 2015-03-05 DIAGNOSIS — Z5112 Encounter for antineoplastic immunotherapy: Secondary | ICD-10-CM | POA: Diagnosis present

## 2015-03-05 DIAGNOSIS — C9000 Multiple myeloma not having achieved remission: Secondary | ICD-10-CM

## 2015-03-05 DIAGNOSIS — Z5111 Encounter for antineoplastic chemotherapy: Secondary | ICD-10-CM

## 2015-03-05 DIAGNOSIS — T451X5A Adverse effect of antineoplastic and immunosuppressive drugs, initial encounter: Secondary | ICD-10-CM

## 2015-03-05 DIAGNOSIS — R5383 Other fatigue: Secondary | ICD-10-CM | POA: Diagnosis not present

## 2015-03-05 DIAGNOSIS — I8290 Acute embolism and thrombosis of unspecified vein: Secondary | ICD-10-CM | POA: Diagnosis not present

## 2015-03-05 DIAGNOSIS — D6481 Anemia due to antineoplastic chemotherapy: Secondary | ICD-10-CM

## 2015-03-05 HISTORY — DX: Encounter for antineoplastic chemotherapy: Z51.11

## 2015-03-05 LAB — CBC WITH DIFFERENTIAL/PLATELET
BASO%: 0.4 % (ref 0.0–2.0)
BASOS ABS: 0 10*3/uL (ref 0.0–0.1)
EOS ABS: 0 10*3/uL (ref 0.0–0.5)
EOS%: 0.8 % (ref 0.0–7.0)
HCT: 28.9 % — ABNORMAL LOW (ref 38.4–49.9)
HEMOGLOBIN: 8.9 g/dL — AB (ref 13.0–17.1)
LYMPH#: 0.7 10*3/uL — AB (ref 0.9–3.3)
LYMPH%: 27.6 % (ref 14.0–49.0)
MCH: 25.2 pg — ABNORMAL LOW (ref 27.2–33.4)
MCHC: 30.8 g/dL — ABNORMAL LOW (ref 32.0–36.0)
MCV: 81.9 fL (ref 79.3–98.0)
MONO#: 0.3 10*3/uL (ref 0.1–0.9)
MONO%: 13.8 % (ref 0.0–14.0)
NEUT#: 1.4 10*3/uL — ABNORMAL LOW (ref 1.5–6.5)
NEUT%: 57.4 % (ref 39.0–75.0)
NRBC: 0 % (ref 0–0)
PLATELETS: 215 10*3/uL (ref 140–400)
RBC: 3.53 10*6/uL — ABNORMAL LOW (ref 4.20–5.82)
RDW: 16.4 % — AB (ref 11.0–14.6)
WBC: 2.4 10*3/uL — ABNORMAL LOW (ref 4.0–10.3)

## 2015-03-05 LAB — COMPREHENSIVE METABOLIC PANEL
ALBUMIN: 3.6 g/dL (ref 3.5–5.0)
ALK PHOS: 41 U/L (ref 40–150)
ALT: 13 U/L (ref 0–55)
ANION GAP: 8 meq/L (ref 3–11)
AST: 14 U/L (ref 5–34)
BILIRUBIN TOTAL: 2.2 mg/dL — AB (ref 0.20–1.20)
BUN: 5.7 mg/dL — ABNORMAL LOW (ref 7.0–26.0)
CO2: 24 mEq/L (ref 22–29)
CREATININE: 1 mg/dL (ref 0.7–1.3)
Calcium: 9 mg/dL (ref 8.4–10.4)
Chloride: 109 mEq/L (ref 98–109)
GLUCOSE: 124 mg/dL (ref 70–140)
Potassium: 4 mEq/L (ref 3.5–5.1)
Sodium: 141 mEq/L (ref 136–145)
TOTAL PROTEIN: 6.1 g/dL — AB (ref 6.4–8.3)

## 2015-03-05 MED ORDER — ZOLEDRONIC ACID 4 MG/100ML IV SOLN
4.0000 mg | Freq: Once | INTRAVENOUS | Status: AC
  Administered 2015-03-05: 4 mg via INTRAVENOUS
  Filled 2015-03-05: qty 100

## 2015-03-05 MED ORDER — SODIUM CHLORIDE 0.9 % IV SOLN
Freq: Once | INTRAVENOUS | Status: AC
  Administered 2015-03-05: 11:00:00 via INTRAVENOUS

## 2015-03-05 MED ORDER — SODIUM CHLORIDE 0.9 % IV SOLN
Freq: Once | INTRAVENOUS | Status: AC
  Administered 2015-03-05: 11:00:00 via INTRAVENOUS
  Filled 2015-03-05: qty 4

## 2015-03-05 MED ORDER — DEXTROSE 5 % IV SOLN
36.0000 mg/m2 | Freq: Once | INTRAVENOUS | Status: AC
  Administered 2015-03-05: 72 mg via INTRAVENOUS
  Filled 2015-03-05: qty 30

## 2015-03-05 MED ORDER — SODIUM CHLORIDE 0.9 % IJ SOLN
10.0000 mL | INTRAMUSCULAR | Status: DC | PRN
  Administered 2015-03-05: 10 mL
  Filled 2015-03-05: qty 10

## 2015-03-05 MED ORDER — HEPARIN SOD (PORK) LOCK FLUSH 100 UNIT/ML IV SOLN
500.0000 [IU] | Freq: Once | INTRAVENOUS | Status: AC | PRN
  Administered 2015-03-05: 500 [IU]
  Filled 2015-03-05: qty 5

## 2015-03-05 MED ORDER — SODIUM CHLORIDE 0.9 % IV SOLN: Freq: Once | INTRAVENOUS | Status: DC

## 2015-03-05 NOTE — Progress Notes (Signed)
Per Dr Mohamed it is okay to treat pt today with chemotherapy and today's labs 

## 2015-03-05 NOTE — Patient Instructions (Addendum)
[facility] Discharge Instructions for Patients Receiving Chemotherapy  Today you received the following chemotherapy agents:  Krypolis, Zometa  To help prevent nausea and vomiting after your treatment, we encourage you to take your nausea medication as prescribed.   If you develop nausea and vomiting that is not controlled by your nausea medication, call the clinic.   BELOW ARE SYMPTOMS THAT SHOULD BE REPORTED IMMEDIATELY:  *FEVER GREATER THAN 100.5 F  *CHILLS WITH OR WITHOUT FEVER  NAUSEA AND VOMITING THAT IS NOT CONTROLLED WITH YOUR NAUSEA MEDICATION  *UNUSUAL SHORTNESS OF BREATH  *UNUSUAL BRUISING OR BLEEDING  TENDERNESS IN MOUTH AND THROAT WITH OR WITHOUT PRESENCE OF ULCERS  *URINARY PROBLEMS  *BOWEL PROBLEMS  UNUSUAL RASH Items with * indicate a potential emergency and should be followed up as soon as possible.  Feel free to call the clinic you have any questions or concerns. The clinic phone number is [phone].  Please show the [place] at check-in to the Emergency Department and triage nurse.

## 2015-03-05 NOTE — Progress Notes (Addendum)
[facility] [phone]   [phone]  OFFICE PROGRESS NOTE  Eddie Thomas [address] 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  2) the venous thrombosis diagnosed in June 2016  PRIOR THERAPY: :  1. Status post 6 cycles of systemic chemotherapy with Revlimid and Decadron, last dose was given 07/21/2010 with very good response. 2. 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 [name].  3. maintenance Revlimid at 10 mg by mouth daily status post 2 months. Therapy began 01/18/2011. 4. maintenance Revlimid at 15 mg by mouth daily with prophylactic dose Coumadin at 2 mg by mouth daily. 5. Systemic chemotherapy with Velcade at 1.3 mg per meter squared given on days 1, 4, 8 and 11 and Doxil at 30 mg per meter square given on day 4 and Decadron 40 mg by mouth on weekly basis given every 3 weeks. Status post 4 cycles. 6. Zometa 4 mg IV every 4 weeks.  7. Velcade 1.3 mg/M2 subcutaneous daily on a weekly basis with Decadron 20 mg by mouth on a weekly basis. First cycle expected on 06/21/2012. S/P 4 cycles. 8. Systemic chemotherapy with Carfilzomib, cyclophosphamide and Decadron. First cycle started on 08/09/2012. He is status post 19 cycles.   CURRENT THERAPY:  1) Systemic chemotherapy with Carfilzomib 36 MG/M2 on days 1, 2, 8, 9, 15 and 16 every 4 weeks, Pomalyst 4 mg by mouth daily for 21 days every 4 weeks and Decadron 40 mg by mouth weekly. First cycle started on 04/10/2014. Status post 11 cycles. 2) Zometa every 12 weeks.  3) Xarelto 20 mg by mouth daily, started 07/17/2014.   INTERVAL HISTORY: Eddie Thomas 70 y.o. male returns to the clinic today for followup visit. He is currently on treatment with Carfilzomib, Pomalyst and dexamethasone status post 11 cycle. He is tolerating his treatment fairly well except  for persistent mild pancytopenia, fatigue as well as elevated serum bilirubin. He denied having any significant weight loss or night sweats. He has no fever or chills. He has no nausea or vomiting. The patient denied having any significant chest pain, shortness of breath, cough or hemoptysis. His last myeloma panel showed no evidence for disease progression. The patient was supposed to start cycle #12 today. He is here for evaluation before starting his treatment.  MEDICAL HISTORY: Past Medical History  Diagnosis Date  . Multiple myeloma   . Diabetes mellitus ([place]) 07/03/2011  . Hypertension 07/03/2011  . Hyperlipidemia 07/03/2011  . Colon polyps 2012  . Gastric ulcer   . Bulging lumbar disc     ALLERGIES:  has No Known Allergies.  MEDICATIONS:  Current Outpatient Prescriptions  Medication Sig Dispense Refill  . Cholecalciferol (VITAMIN D-3) 5000 UNITS TABS Take 1 tablet by mouth daily.    [name] dexamethasone (DECADRON) 4 MG tablet TAKE FIVE TABLETS BY MOUTH EVERY WEEK START WITH THE FIRST DOSE OF CHEMOTHERAPY 80 tablet 0  . gabapentin (NEURONTIN) 300 MG capsule Take 300 mg by mouth 3 (three) times daily.    [name] lidocaine-prilocaine (EMLA) cream Apply 1 application topically as needed. Apply one hour before use and cover with plastic wrap. 30 g 1  . lisinopril (PRINIVIL,ZESTRIL) 10 MG tablet Take 5 mg by mouth every evening.     [name] oxyCODONE-acetaminophen (PERCOCET/ROXICET) 5-325 MG tablet Take 1 tablet by mouth every 6 (six) hours as  needed for severe pain. 30 tablet 0  . pantoprazole (PROTONIX) 40 MG tablet TAKE ONE TABLET BY MOUTH TWICE DAILY 60 tablet 11  . pioglitazone (ACTOS) 15 MG tablet Take 15 mg by mouth daily. Reported on 01/16/2015    . POMALYST 4 MG capsule TAKE 1 CAP BY MOUTH ON DAYS 1-21, REPEAT EVERY 28 DAYS. 21 capsule 1  . prochlorperazine (COMPAZINE) 10 MG tablet Take 10 mg by mouth every 6 (six) hours as needed for nausea or vomiting. Reported on 01/16/2015    . simvastatin  (ZOCOR) 10 MG tablet Take 10 mg by mouth at bedtime.      . temazepam (RESTORIL) 15 MG capsule Take 15 mg by mouth at bedtime as needed for sleep. Reported on 01/16/2015    . valACYclovir (VALTREX) 500 MG tablet TAKE ONE CAPLET BY MOUTH ONCE DAILY 30 tablet 3  . VIAGRA 50 MG tablet Take 50 mg by mouth daily as needed for erectile dysfunction.     Alveda Reasons 20 MG TABS tablet TAKE ONE TABLET BY MOUTH ONCE DAILY WITH SUPPER 30 tablet 0   No current facility-administered medications for this visit.    SURGICAL HISTORY:  Past Surgical History  Procedure Laterality Date  . Limbal stem cell transplant    . Humerus fracture surgery      right  . 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    REVIEW OF SYSTEMS:  A comprehensive review of systems was negative except for: Constitutional: positive for fatigue   PHYSICAL EXAMINATION: General appearance: alert, cooperative and no distress Head: Normocephalic, without obvious abnormality, atraumatic Neck: no adenopathy, no JVD, supple, symmetrical, trachea midline and thyroid not enlarged, symmetric, no tenderness/mass/nodules Lymph nodes: Cervical, supraclavicular, and axillary nodes normal. Resp: clear to auscultation bilaterally Back: symmetric, no curvature. ROM normal. No CVA tenderness. Cardio: regular rate and rhythm, S1, S2 normal, no murmur, click, rub or gallop GI: soft, non-tender; bowel sounds normal; no masses,  no organomegaly Extremities: extremities normal, atraumatic, no cyanosis or edema Neurologic: Alert and oriented X 3, normal strength and tone. Normal symmetric reflexes. Normal coordination and gait  ECOG PERFORMANCE STATUS: 1 - Symptomatic but completely ambulatory  Blood pressure 154/79, pulse 60, temperature 98 F (36.7 C), temperature source Oral, resp. rate 18, height 5' 10.5" (1.791 m), weight 172 lb (78.019 kg), SpO2 100 %.  LABORATORY DATA: Lab Results  Component Value Date   WBC 2.4* 03/05/2015    HGB 8.9* 03/05/2015   HCT 28.9* 03/05/2015   MCV 81.9 03/05/2015   PLT 215 03/05/2015      Chemistry      Component Value Date/Time   NA 141 03/05/2015 0908   NA 139 08/30/2013 0935   K 4.0 03/05/2015 0908   K 4.1 08/30/2013 0935   CL 101 08/30/2013 0935   CL 103 07/12/2012 0907   CO2 24 03/05/2015 0908   CO2 26 08/30/2013 0935   BUN 5.7* 03/05/2015 0908   BUN 11 08/30/2013 0935   CREATININE 1.0 03/05/2015 0908   CREATININE 1.03 08/30/2013 0935      Component Value Date/Time   CALCIUM 9.0 03/05/2015 0908   CALCIUM 9.2 08/30/2013 0935   ALKPHOS 41 03/05/2015 0908   ALKPHOS 39 08/30/2013 0935   AST 14 03/05/2015 0908   AST 13 08/30/2013 0935   ALT 13 03/05/2015 0908   ALT 9 08/30/2013 0935   BILITOT 2.20* 03/05/2015 0908   BILITOT 1.3* 08/30/2013 0935  Other lab results: Beta-2 microglobulin 2.81, free kappa light chain 0.03, free lambda light chain 5.78 with a kappa/lambda ratio of 0.01. IgG 106, IgA 398 and IgM less than 5.  RADIOGRAPHIC STUDIES: No results found.  ASSESSMENT AND PLAN: This is a very pleasant 70 years old [place] American male with history of multiple myeloma currently undergoing systemic chemotherapy with Carfilzomib, cyclophosphamide and Decadron status post 19 cycles. This was discontinued secondary to disease progression. The patient was started on systemic chemotherapy with Carfilzomib, Pomalyst and Decadron status post 11 cycles. I recommended for the patient to proceed with cycle #12 today as scheduled. He will have repeat myeloma panel in 3 weeks for reevaluation of his disease. If the next myeloma panel showed no evidence for disease progression, I will consider giving the patient break off chemotherapy. He'll continue on Zometa every 3 months. For the venous thrombosis, he will continue on Xarelto 20 mg by mouth daily. For the persistent hyperbilirubinemia, I will order ultrasound of the abdomen to rule out any gallbladder or liver  disease. He was advised to call immediately if he has any concerning symptoms in the interval.  The patient voices understanding of current disease status and treatment options and is in agreement with the current care plan. All questions were answered. The patient knows to call the clinic with any problems, questions or concerns. We can certainly see the patient much sooner if necessary.  Disclaimer: This note was dictated with voice recognition software. Similar sounding words can inadvertently be transcribed and may not be corrected upon review.

## 2015-03-05 NOTE — Telephone Encounter (Signed)
per pof to sch pt appt-pt sh already made out

## 2015-03-06 ENCOUNTER — Ambulatory Visit (HOSPITAL_BASED_OUTPATIENT_CLINIC_OR_DEPARTMENT_OTHER): Payer: Medicare Other

## 2015-03-06 VITALS — BP 137/67 | HR 53 | Temp 97.6°F | Resp 18

## 2015-03-06 DIAGNOSIS — C9000 Multiple myeloma not having achieved remission: Secondary | ICD-10-CM

## 2015-03-06 DIAGNOSIS — Z5111 Encounter for antineoplastic chemotherapy: Secondary | ICD-10-CM | POA: Diagnosis present

## 2015-03-06 MED ORDER — DEXTROSE 5 % IV SOLN
36.0000 mg/m2 | Freq: Once | INTRAVENOUS | Status: AC
  Administered 2015-03-06: 72 mg via INTRAVENOUS
  Filled 2015-03-06: qty 36

## 2015-03-06 MED ORDER — SODIUM CHLORIDE 0.9 % IV SOLN
Freq: Once | INTRAVENOUS | Status: AC
  Administered 2015-03-06: 09:00:00 via INTRAVENOUS

## 2015-03-06 MED ORDER — HEPARIN SOD (PORK) LOCK FLUSH 100 UNIT/ML IV SOLN
500.0000 [IU] | Freq: Once | INTRAVENOUS | Status: AC | PRN
  Administered 2015-03-06: 500 [IU]
  Filled 2015-03-06: qty 5

## 2015-03-06 MED ORDER — SODIUM CHLORIDE 0.9 % IV SOLN
Freq: Once | INTRAVENOUS | Status: AC
  Administered 2015-03-06: 10:00:00 via INTRAVENOUS
  Filled 2015-03-06: qty 4

## 2015-03-06 MED ORDER — SODIUM CHLORIDE 0.9 % IJ SOLN
10.0000 mL | INTRAMUSCULAR | Status: DC | PRN
  Administered 2015-03-06: 10 mL
  Filled 2015-03-06: qty 10

## 2015-03-06 MED ORDER — SODIUM CHLORIDE 0.9 % IV SOLN: Freq: Once | INTRAVENOUS | Status: DC

## 2015-03-06 NOTE — Patient Instructions (Signed)
[facility] Discharge Instructions for Patients Receiving Chemotherapy  Today you received the following chemotherapy agents Kyprolis  To help prevent nausea and vomiting after your treatment, we encourage you to take your nausea medication    If you develop nausea and vomiting that is not controlled by your nausea medication, call the clinic.   BELOW ARE SYMPTOMS THAT SHOULD BE REPORTED IMMEDIATELY:  *FEVER GREATER THAN 100.5 F  *CHILLS WITH OR WITHOUT FEVER  NAUSEA AND VOMITING THAT IS NOT CONTROLLED WITH YOUR NAUSEA MEDICATION  *UNUSUAL SHORTNESS OF BREATH  *UNUSUAL BRUISING OR BLEEDING  TENDERNESS IN MOUTH AND THROAT WITH OR WITHOUT PRESENCE OF ULCERS  *URINARY PROBLEMS  *BOWEL PROBLEMS  UNUSUAL RASH Items with * indicate a potential emergency and should be followed up as soon as possible.  Feel free to call the clinic you have any questions or concerns. The clinic phone number is [phone].  Please show the CHEMO ALERT CARD at check-in to the Emergency Department and triage nurse.   

## 2015-03-08 ENCOUNTER — Encounter: Payer: Self-pay | Admitting: Internal Medicine

## 2015-03-08 NOTE — Progress Notes (Signed)
Per [facility] pomalyst approved 01/07/15-03/07/16 [id number]. I sent to medical records

## 2015-03-12 ENCOUNTER — Ambulatory Visit (HOSPITAL_BASED_OUTPATIENT_CLINIC_OR_DEPARTMENT_OTHER): Payer: Medicare Other

## 2015-03-12 ENCOUNTER — Other Ambulatory Visit (HOSPITAL_BASED_OUTPATIENT_CLINIC_OR_DEPARTMENT_OTHER): Payer: Medicare Other

## 2015-03-12 ENCOUNTER — Telehealth: Payer: Self-pay | Admitting: Internal Medicine

## 2015-03-12 VITALS — BP 159/72 | HR 58 | Temp 98.5°F | Resp 18

## 2015-03-12 DIAGNOSIS — Z5112 Encounter for antineoplastic immunotherapy: Secondary | ICD-10-CM

## 2015-03-12 DIAGNOSIS — C9000 Multiple myeloma not having achieved remission: Secondary | ICD-10-CM

## 2015-03-12 LAB — CBC WITH DIFFERENTIAL/PLATELET
BASO%: 0.3 % (ref 0.0–2.0)
Basophils Absolute: 0 10*3/uL (ref 0.0–0.1)
EOS%: 0.6 % (ref 0.0–7.0)
Eosinophils Absolute: 0 10*3/uL (ref 0.0–0.5)
HCT: 28.4 % — ABNORMAL LOW (ref 38.4–49.9)
HGB: 8.8 g/dL — ABNORMAL LOW (ref 13.0–17.1)
LYMPH%: 32.3 % (ref 14.0–49.0)
MCH: 25.1 pg — ABNORMAL LOW (ref 27.2–33.4)
MCHC: 31 g/dL — ABNORMAL LOW (ref 32.0–36.0)
MCV: 81.1 fL (ref 79.3–98.0)
MONO#: 0.1 10*3/uL (ref 0.1–0.9)
MONO%: 4.5 % (ref 0.0–14.0)
NEUT%: 62.3 % (ref 39.0–75.0)
NEUTROS ABS: 2 10*3/uL (ref 1.5–6.5)
Platelets: 81 10*3/uL — ABNORMAL LOW (ref 140–400)
RBC: 3.5 10*6/uL — ABNORMAL LOW (ref 4.20–5.82)
RDW: 16.4 % — ABNORMAL HIGH (ref 11.0–14.6)
WBC: 3.1 10*3/uL — AB (ref 4.0–10.3)
lymph#: 1 10*3/uL (ref 0.9–3.3)
nRBC: 0 % (ref 0–0)

## 2015-03-12 LAB — COMPREHENSIVE METABOLIC PANEL
ALBUMIN: 3.6 g/dL (ref 3.5–5.0)
ALK PHOS: 43 U/L (ref 40–150)
ALT: 11 U/L (ref 0–55)
AST: 11 U/L (ref 5–34)
Anion Gap: 9 mEq/L (ref 3–11)
BILIRUBIN TOTAL: 2.3 mg/dL — AB (ref 0.20–1.20)
BUN: 8.8 mg/dL (ref 7.0–26.0)
CO2: 23 mEq/L (ref 22–29)
Calcium: 9 mg/dL (ref 8.4–10.4)
Chloride: 108 mEq/L (ref 98–109)
Creatinine: 1 mg/dL (ref 0.7–1.3)
GLUCOSE: 123 mg/dL (ref 70–140)
Potassium: 3.5 mEq/L (ref 3.5–5.1)
SODIUM: 140 meq/L (ref 136–145)
TOTAL PROTEIN: 6.3 g/dL — AB (ref 6.4–8.3)

## 2015-03-12 LAB — TECHNOLOGIST REVIEW

## 2015-03-12 MED ORDER — SODIUM CHLORIDE 0.9 % IV SOLN
Freq: Once | INTRAVENOUS | Status: AC
  Administered 2015-03-12: 11:00:00 via INTRAVENOUS
  Filled 2015-03-12: qty 4

## 2015-03-12 MED ORDER — DEXTROSE 5 % IV SOLN
36.0000 mg/m2 | Freq: Once | INTRAVENOUS | Status: AC
  Administered 2015-03-12: 72 mg via INTRAVENOUS
  Filled 2015-03-12: qty 30

## 2015-03-12 MED ORDER — SODIUM CHLORIDE 0.9 % IV SOLN
Freq: Once | INTRAVENOUS | Status: AC
  Administered 2015-03-12: 10:00:00 via INTRAVENOUS

## 2015-03-12 MED ORDER — SODIUM CHLORIDE 0.9 % IV SOLN: Freq: Once | INTRAVENOUS | Status: DC

## 2015-03-12 MED ORDER — SODIUM CHLORIDE 0.9 % IJ SOLN
10.0000 mL | INTRAMUSCULAR | Status: DC | PRN
  Administered 2015-03-12: 10 mL
  Filled 2015-03-12: qty 10

## 2015-03-12 MED ORDER — HEPARIN SOD (PORK) LOCK FLUSH 100 UNIT/ML IV SOLN
500.0000 [IU] | Freq: Once | INTRAVENOUS | Status: AC | PRN
  Administered 2015-03-12: 500 [IU]
  Filled 2015-03-12: qty 5

## 2015-03-12 NOTE — Telephone Encounter (Addendum)
ADDED LAB FOR 3/6 - [facility] DATED 3/8. PATIENT IS AWARE AND HAS NEW SCHEDULE. PATIENT INQUIRED ABOUT LAB TO BE DONE 1 WK PRIOR TO SEEING MM.

## 2015-03-12 NOTE — Telephone Encounter (Signed)
Received a call from infusion nurse Eulas Post) - patient in infusion and inquiring about [place] that was to be done last week. Order location routed to [facility] - per patient he did not request this location. Spoke with Peggy at central radiology scheduling and scheduled [place] for tomorrow 2/21 at 7 am at [facility] - patient informed via infusion nurse while in infusion area.

## 2015-03-12 NOTE — Progress Notes (Signed)
Okay to treat today despite plt count 81 and total bili elevated to 2.3 per Dr. Julien Nordmann.

## 2015-03-12 NOTE — Patient Instructions (Signed)
[facility] Discharge Instructions for Patients Receiving Chemotherapy  Today you received the following chemotherapy agents Kyprolis.  To help prevent nausea and vomiting after your treatment, we encourage you to take your nausea medication as prescribed.   If you develop nausea and vomiting that is not controlled by your nausea medication, call the clinic.   BELOW ARE SYMPTOMS THAT SHOULD BE REPORTED IMMEDIATELY:  *FEVER GREATER THAN 100.5 F  *CHILLS WITH OR WITHOUT FEVER  NAUSEA AND VOMITING THAT IS NOT CONTROLLED WITH YOUR NAUSEA MEDICATION  *UNUSUAL SHORTNESS OF BREATH  *UNUSUAL BRUISING OR BLEEDING  TENDERNESS IN MOUTH AND THROAT WITH OR WITHOUT PRESENCE OF ULCERS  *URINARY PROBLEMS  *BOWEL PROBLEMS  UNUSUAL RASH Items with * indicate a potential emergency and should be followed up as soon as possible.  Feel free to call the clinic you have any questions or concerns. The clinic phone number is [phone].  Please show the CHEMO ALERT CARD at check-in to the Emergency Department and triage nurse.   

## 2015-03-12 NOTE — Progress Notes (Signed)
Pt arrived to infusion inquiring about work up [place] wanted to do pertaining to his fluctuating liver enyzmes.  Upon investigation, order in for abdominal US but nothing has been scheduled.  Spoke with Melissa in scheduling who has set up for pt to have abdominal US tomorrow 03/13/15 at 0700 prior to treatment in infusion area.  Pt informed of plan of care and for him to have nothing to eat or drink after midnight.  Pt verbalized understanding and no further questions at this time.

## 2015-03-13 ENCOUNTER — Ambulatory Visit (HOSPITAL_BASED_OUTPATIENT_CLINIC_OR_DEPARTMENT_OTHER): Payer: Medicare Other

## 2015-03-13 ENCOUNTER — Ambulatory Visit (HOSPITAL_COMMUNITY)
Admission: RE | Admit: 2015-03-13 | Discharge: 2015-03-13 | Disposition: A | Discharge status: Home / Self Care | Payer: Medicare Other | Source: Ambulatory Visit | Attending: Internal Medicine | Admitting: Internal Medicine

## 2015-03-13 ENCOUNTER — Other Ambulatory Visit: Payer: Self-pay | Admitting: *Deleted

## 2015-03-13 VITALS — BP 170/84 | HR 53 | Temp 97.5°F | Resp 16

## 2015-03-13 DIAGNOSIS — C9000 Multiple myeloma not having achieved remission: Secondary | ICD-10-CM | POA: Insufficient documentation

## 2015-03-13 DIAGNOSIS — Z5112 Encounter for antineoplastic immunotherapy: Secondary | ICD-10-CM | POA: Diagnosis present

## 2015-03-13 DIAGNOSIS — N281 Cyst of kidney, acquired: Secondary | ICD-10-CM | POA: Diagnosis not present

## 2015-03-13 DIAGNOSIS — K7689 Other specified diseases of liver: Secondary | ICD-10-CM | POA: Diagnosis not present

## 2015-03-13 MED ORDER — SODIUM CHLORIDE 0.9 % IV SOLN
Freq: Once | INTRAVENOUS | Status: AC
  Administered 2015-03-13: 09:00:00 via INTRAVENOUS
  Filled 2015-03-13: qty 4

## 2015-03-13 MED ORDER — SODIUM CHLORIDE 0.9 % IV SOLN
Freq: Once | INTRAVENOUS | Status: AC
  Administered 2015-03-13: 09:00:00 via INTRAVENOUS

## 2015-03-13 MED ORDER — SODIUM CHLORIDE 0.9 % IV SOLN: Freq: Once | INTRAVENOUS | Status: DC

## 2015-03-13 MED ORDER — SODIUM CHLORIDE 0.9 % IJ SOLN
10.0000 mL | INTRAMUSCULAR | Status: DC | PRN
  Administered 2015-03-13: 10 mL
  Filled 2015-03-13: qty 10

## 2015-03-13 MED ORDER — HEPARIN SOD (PORK) LOCK FLUSH 100 UNIT/ML IV SOLN
500.0000 [IU] | Freq: Once | INTRAVENOUS | Status: AC | PRN
  Administered 2015-03-13: 500 [IU]
  Filled 2015-03-13: qty 5

## 2015-03-13 MED ORDER — DEXTROSE 5 % IV SOLN
36.0000 mg/m2 | Freq: Once | INTRAVENOUS | Status: AC
  Administered 2015-03-13: 72 mg via INTRAVENOUS
  Filled 2015-03-13: qty 6

## 2015-03-13 NOTE — Patient Instructions (Signed)
[facility] Discharge Instructions for Patients Receiving Chemotherapy  Today you received the following chemotherapy agents kyprolis  To help prevent nausea and vomiting after your treatment, we encourage you to take your nausea medication as directed   If you develop nausea and vomiting that is not controlled by your nausea medication, call the clinic.   BELOW ARE SYMPTOMS THAT SHOULD BE REPORTED IMMEDIATELY:  *FEVER GREATER THAN 100.5 F  *CHILLS WITH OR WITHOUT FEVER  NAUSEA AND VOMITING THAT IS NOT CONTROLLED WITH YOUR NAUSEA MEDICATION  *UNUSUAL SHORTNESS OF BREATH  *UNUSUAL BRUISING OR BLEEDING  TENDERNESS IN MOUTH AND THROAT WITH OR WITHOUT PRESENCE OF ULCERS  *URINARY PROBLEMS  *BOWEL PROBLEMS  UNUSUAL RASH Items with * indicate a potential emergency and should be followed up as soon as possible.  Feel free to call the clinic you have any questions or concerns. The clinic phone number is [phone].  

## 2015-03-19 ENCOUNTER — Other Ambulatory Visit: Payer: Self-pay | Admitting: Internal Medicine

## 2015-03-19 ENCOUNTER — Ambulatory Visit (HOSPITAL_BASED_OUTPATIENT_CLINIC_OR_DEPARTMENT_OTHER): Payer: Medicare Other

## 2015-03-19 ENCOUNTER — Other Ambulatory Visit (HOSPITAL_BASED_OUTPATIENT_CLINIC_OR_DEPARTMENT_OTHER): Payer: Medicare Other

## 2015-03-19 VITALS — BP 172/80 | HR 57 | Temp 97.8°F | Resp 16

## 2015-03-19 DIAGNOSIS — C9000 Multiple myeloma not having achieved remission: Secondary | ICD-10-CM | POA: Diagnosis present

## 2015-03-19 DIAGNOSIS — Z5112 Encounter for antineoplastic immunotherapy: Secondary | ICD-10-CM

## 2015-03-19 LAB — COMPREHENSIVE METABOLIC PANEL
ALT: 11 U/L (ref 0–55)
AST: 11 U/L (ref 5–34)
Albumin: 3.6 g/dL (ref 3.5–5.0)
Alkaline Phosphatase: 40 U/L (ref 40–150)
Anion Gap: 9 mEq/L (ref 3–11)
BILIRUBIN TOTAL: 2.25 mg/dL — AB (ref 0.20–1.20)
BUN: 12.3 mg/dL (ref 7.0–26.0)
CO2: 22 meq/L (ref 22–29)
Calcium: 9.1 mg/dL (ref 8.4–10.4)
Chloride: 108 mEq/L (ref 98–109)
Creatinine: 1 mg/dL (ref 0.7–1.3)
GLUCOSE: 142 mg/dL — AB (ref 70–140)
Potassium: 3.8 mEq/L (ref 3.5–5.1)
SODIUM: 139 meq/L (ref 136–145)
TOTAL PROTEIN: 6.2 g/dL — AB (ref 6.4–8.3)

## 2015-03-19 LAB — CBC WITH DIFFERENTIAL/PLATELET
BASO%: 1 % (ref 0.0–2.0)
Basophils Absolute: 0 10*3/uL (ref 0.0–0.1)
EOS ABS: 0.1 10*3/uL (ref 0.0–0.5)
EOS%: 3.3 % (ref 0.0–7.0)
HCT: 29 % — ABNORMAL LOW (ref 38.4–49.9)
HGB: 8.9 g/dL — ABNORMAL LOW (ref 13.0–17.1)
LYMPH%: 23.5 % (ref 14.0–49.0)
MCH: 24.9 pg — ABNORMAL LOW (ref 27.2–33.4)
MCHC: 30.6 g/dL — ABNORMAL LOW (ref 32.0–36.0)
MCV: 81.5 fL (ref 79.3–98.0)
MONO#: 0.4 10*3/uL (ref 0.1–0.9)
MONO%: 12.4 % (ref 0.0–14.0)
NEUT%: 59.8 % (ref 39.0–75.0)
NEUTROS ABS: 1.9 10*3/uL (ref 1.5–6.5)
Platelets: 122 10*3/uL — ABNORMAL LOW (ref 140–400)
RBC: 3.56 10*6/uL — AB (ref 4.20–5.82)
RDW: 16.5 % — ABNORMAL HIGH (ref 11.0–14.6)
WBC: 3.1 10*3/uL — AB (ref 4.0–10.3)
lymph#: 0.7 10*3/uL — ABNORMAL LOW (ref 0.9–3.3)

## 2015-03-19 MED ORDER — SODIUM CHLORIDE 0.9 % IV SOLN
Freq: Once | INTRAVENOUS | Status: AC
  Administered 2015-03-19: 10:00:00 via INTRAVENOUS

## 2015-03-19 MED ORDER — SODIUM CHLORIDE 0.9 % IJ SOLN
10.0000 mL | INTRAMUSCULAR | Status: DC | PRN
  Administered 2015-03-19: 10 mL
  Filled 2015-03-19: qty 10

## 2015-03-19 MED ORDER — DEXTROSE 5 % IV SOLN
36.0000 mg/m2 | Freq: Once | INTRAVENOUS | Status: AC
  Administered 2015-03-19: 72 mg via INTRAVENOUS
  Filled 2015-03-19: qty 30

## 2015-03-19 MED ORDER — HEPARIN SOD (PORK) LOCK FLUSH 100 UNIT/ML IV SOLN
500.0000 [IU] | Freq: Once | INTRAVENOUS | Status: AC | PRN
  Administered 2015-03-19: 500 [IU]
  Filled 2015-03-19: qty 5

## 2015-03-19 MED ORDER — SODIUM CHLORIDE 0.9 % IV SOLN
Freq: Once | INTRAVENOUS | Status: AC
  Administered 2015-03-19: 10:00:00 via INTRAVENOUS
  Filled 2015-03-19: qty 4

## 2015-03-19 NOTE — Progress Notes (Signed)
Ok to treat with 2.25 bilirubin per MD [facility]

## 2015-03-19 NOTE — Patient Instructions (Signed)
[facility] Discharge Instructions for Patients Receiving Chemotherapy  Today you received the following chemotherapy agents: Kyprolis  To help prevent nausea and vomiting after your treatment, we encourage you to take your nausea medication: as directed.   If you develop nausea and vomiting that is not controlled by your nausea medication, call the clinic.   BELOW ARE SYMPTOMS THAT SHOULD BE REPORTED IMMEDIATELY:  *FEVER GREATER THAN 100.5 F  *CHILLS WITH OR WITHOUT FEVER  NAUSEA AND VOMITING THAT IS NOT CONTROLLED WITH YOUR NAUSEA MEDICATION  *UNUSUAL SHORTNESS OF BREATH  *UNUSUAL BRUISING OR BLEEDING  TENDERNESS IN MOUTH AND THROAT WITH OR WITHOUT PRESENCE OF ULCERS  *URINARY PROBLEMS  *BOWEL PROBLEMS  UNUSUAL RASH Items with * indicate a potential emergency and should be followed up as soon as possible.  Feel free to call the clinic you have any questions or concerns. The clinic phone number is [phone].    

## 2015-03-20 ENCOUNTER — Ambulatory Visit (HOSPITAL_BASED_OUTPATIENT_CLINIC_OR_DEPARTMENT_OTHER): Payer: Medicare Other

## 2015-03-20 ENCOUNTER — Other Ambulatory Visit: Payer: Self-pay | Admitting: Medical Oncology

## 2015-03-20 VITALS — BP 180/98 | HR 55 | Temp 98.2°F | Resp 18

## 2015-03-20 DIAGNOSIS — C9000 Multiple myeloma not having achieved remission: Secondary | ICD-10-CM | POA: Diagnosis not present

## 2015-03-20 DIAGNOSIS — I158 Other secondary hypertension: Secondary | ICD-10-CM

## 2015-03-20 DIAGNOSIS — Z5112 Encounter for antineoplastic immunotherapy: Secondary | ICD-10-CM | POA: Diagnosis present

## 2015-03-20 MED ORDER — CLONIDINE HCL 0.1 MG PO TABS
ORAL_TABLET | ORAL | Status: AC
  Filled 2015-03-20: qty 1

## 2015-03-20 MED ORDER — SODIUM CHLORIDE 0.9 % IV SOLN
Freq: Once | INTRAVENOUS | Status: AC
  Administered 2015-03-20: 09:00:00 via INTRAVENOUS
  Filled 2015-03-20: qty 4

## 2015-03-20 MED ORDER — SODIUM CHLORIDE 0.9 % IJ SOLN
10.0000 mL | INTRAMUSCULAR | Status: DC | PRN
  Administered 2015-03-20: 10 mL
  Filled 2015-03-20: qty 10

## 2015-03-20 MED ORDER — DEXTROSE 5 % IV SOLN
36.0000 mg/m2 | Freq: Once | INTRAVENOUS | Status: AC
  Administered 2015-03-20: 72 mg via INTRAVENOUS
  Filled 2015-03-20: qty 30

## 2015-03-20 MED ORDER — SODIUM CHLORIDE 0.9 % IV SOLN
Freq: Once | INTRAVENOUS | Status: AC
  Administered 2015-03-20: 09:00:00 via INTRAVENOUS

## 2015-03-20 MED ORDER — CLONIDINE HCL 0.1 MG PO TABS
0.2000 mg | ORAL_TABLET | ORAL | Status: AC
  Administered 2015-03-20: 0.2 mg via ORAL

## 2015-03-20 MED ORDER — HEPARIN SOD (PORK) LOCK FLUSH 100 UNIT/ML IV SOLN
500.0000 [IU] | Freq: Once | INTRAVENOUS | Status: AC | PRN
  Administered 2015-03-20: 500 [IU]
  Filled 2015-03-20: qty 5

## 2015-03-20 NOTE — Patient Instructions (Signed)
[facility] Discharge Instructions for Patients Receiving Chemotherapy  Today you received the following chemotherapy agents Kyprolis.  To help prevent nausea and vomiting after your treatment, we encourage you to take your nausea medication as directed   If you develop nausea and vomiting that is not controlled by your nausea medication, call the clinic.   BELOW ARE SYMPTOMS THAT SHOULD BE REPORTED IMMEDIATELY:  *FEVER GREATER THAN 100.5 F  *CHILLS WITH OR WITHOUT FEVER  NAUSEA AND VOMITING THAT IS NOT CONTROLLED WITH YOUR NAUSEA MEDICATION  *UNUSUAL SHORTNESS OF BREATH  *UNUSUAL BRUISING OR BLEEDING  TENDERNESS IN MOUTH AND THROAT WITH OR WITHOUT PRESENCE OF ULCERS  *URINARY PROBLEMS  *BOWEL PROBLEMS  UNUSUAL RASH Items with * indicate a potential emergency and should be followed up as soon as possible.  Feel free to call the clinic you have any questions or concerns. The clinic phone number is [phone].    Chlorthalidone; Clonidine oral tablets What is this medicine? CHLORTHALIDONE; CLONIDINE (klor THAL i done ; KLOE ni deen) is a combination of two medicines that are used to treat high blood pressure. Chlorthalidone is a diuretic. It lowers blood pressure and increases the amount of urine passed, which causes the body to lose salt and water. Clonidine also lowers blood pressure. This medicine may be used for other purposes; ask your health care provider or pharmacist if you have questions. What should I tell my health care provider before I take this medicine? They need to know if you have any of these conditions: -asthma -diabetes -gout -kidney disease -liver disease -systemic lupus erythematosus (SLE) -an unusual or allergic reaction to chlorthalidone, sulfa drugs, clonidine, other medicines, foods, dyes, or preservatives -pregnant or trying to get pregnant -breast-feeding How should I use this medicine? Take this medicine by mouth with a  glass of water. Take this medicine with food. Follow the directions on the prescription label. Take your doses at regular intervals. Do not take your medicine more often than directed. Remember that you will need to pass urine frequently after taking this medicine. Do not suddenly stop taking this medicine. You must gradually reduce the dose or you may get a dangerous increase in blood pressure. Ask your doctor or health care professional for advice. Talk to your pediatrician regarding the use of this medicine in children. Special care may be needed. Overdosage: If you think you have taken too much of this medicine contact a poison control center or emergency room at once. NOTE: This medicine is only for you. Do not share this medicine with others. What if I miss a dose? If you miss a dose, take it as soon as you can. If it is almost time for your next dose, take only that dose. Do not take double or extra doses. What may interact with this medicine? -barbiturate medicines for inducing sleep or treating seizures like phenobarbital -certain medicines for depression, like amitriptyline or imipramine -digoxin -ephedra, Ma Huang -glycyrrhizin (licorice) -lithium -medicines for diabetes -other medicines for high blood pressure -steroid medicines like prednisone or cortisone This list may not describe all possible interactions. Give your health care provider a list of all the medicines, herbs, non-prescription drugs, or dietary supplements you use. Also tell them if you smoke, drink alcohol, or use illegal drugs. Some items may interact with your medicine. What should I watch for while using this medicine? Visit your doctor or health care professional for regular checks on your progress. Check your blood pressure regularly as  directed. Ask your doctor or health care professional what your blood pressure should be and when you should contact him or her. You may need blood work done while you are taking  this medicine. You may need to be on a special diet while taking this medicine. Ask your doctor. You may get drowsy or dizzy. Do not drive, use machinery, or do anything that needs mental alertness until you know how this medicine affects you. To avoid dizzy or fainting spells, do not stand or sit up quickly, especially if you are an older person. Alcohol can make you more drowsy and dizzy. Avoid alcoholic drinks. This medicine may affect blood sugar levels. If you have diabetes, check with your doctor or health care professional before you change your diet or the dose of your diabetic medicine. Your mouth may get dry. Chewing sugarless gum or sucking hard candy, and drinking plenty of water may help. Contact your doctor if the problem does not go away or is severe. This medicine can make you more sensitive to the sun. Keep out of the sun. If you cannot avoid being in the sun, wear protective clothing and use sunscreen. Do not use sun lamps or tanning beds/booths. Do not treat yourself for coughs, colds, or pain while you are taking this medicine without asking your doctor or health care professional for advice. Some ingredients may increase your blood pressure. If you are going to have surgery tell your doctor or health care professional that you are taking this medicine. What side effects may I notice from receiving this medicine? Side effects that you should report to your doctor or health care professional as soon as possible: -allergic reactions like skin rash, itching or hives, swelling of the face, lips, or tongue -anxiety, nervousness -chest pain -depressed mood -fast, irregular heartbeat -feeling faint or lightheaded, falls -increased hunger or thirst -muscle pain, cramps, or spasm -pain or difficulty when passing urine -pain, tingling, numbness in the hands or feet -redness, blistering, peeling or loosening of the skin, including inside the mouth -swelling of feet or legs -unusually  weak or tired -yellowing of the eyes or skin Side effects that usually do not require medical attention (Report these to your doctor or health care professional if they continue or are bothersome.): -change in sex drive or performance -drowsiness -dry mouth -headache -nausea -stomach upset This list may not describe all possible side effects. Call your doctor for medical advice about side effects. You may report side effects to FDA at 1-800-FDA-1088. Where should I keep my medicine? Keep out of the reach of children. Store between 20 and 25 degrees C (68 and 77 degrees F). Protect from moisture. Throw away any unused medicine after the expiration date. NOTE: This sheet is a summary. It may not cover all possible information. If you have questions about this medicine, talk to your doctor, pharmacist, or health care provider.    2016, Elsevier/Gold Standard. (2011-12-09 13:01:03)

## 2015-03-23 NOTE — Progress Notes (Signed)
Counseling Intern Note: Met with Pt for intake counseling session.  Pt presented as well groomed and oriented x4 with good eye contact and coherent speech. Completed intake paperwork and informed consent.  Explored presenting concerns including facing mortality and coping with non-curable aspect of Dx. Counselor provided supportive environment for emotional processing and collaborated with Pt on direction for future session.  Next apt scheduled for 3/10  Vaughan Sine Counseling Intern [phone]

## 2015-03-26 ENCOUNTER — Other Ambulatory Visit (HOSPITAL_BASED_OUTPATIENT_CLINIC_OR_DEPARTMENT_OTHER): Payer: Medicare Other

## 2015-03-26 DIAGNOSIS — C9000 Multiple myeloma not having achieved remission: Secondary | ICD-10-CM

## 2015-03-26 LAB — COMPREHENSIVE METABOLIC PANEL
ALK PHOS: 42 U/L (ref 40–150)
ALT: 10 U/L (ref 0–55)
ANION GAP: 11 meq/L (ref 3–11)
AST: 8 U/L (ref 5–34)
Albumin: 3.8 g/dL (ref 3.5–5.0)
BUN: 10.5 mg/dL (ref 7.0–26.0)
CALCIUM: 9.3 mg/dL (ref 8.4–10.4)
CO2: 23 mEq/L (ref 22–29)
CREATININE: 1.2 mg/dL (ref 0.7–1.3)
Chloride: 104 mEq/L (ref 98–109)
EGFR: 71 mL/min/{1.73_m2} — ABNORMAL LOW (ref >90)
Glucose: 275 mg/dl — ABNORMAL HIGH (ref 70–140)
Potassium: 4 mEq/L (ref 3.5–5.1)
Sodium: 138 mEq/L (ref 136–145)
Total Bilirubin: 2.56 mg/dL — ABNORMAL HIGH (ref 0.20–1.20)
Total Protein: 6.2 g/dL — ABNORMAL LOW (ref 6.4–8.3)

## 2015-03-26 LAB — CBC WITH DIFFERENTIAL/PLATELET
BASO%: 0.3 % (ref 0.0–2.0)
BASOS ABS: 0 10*3/uL (ref 0.0–0.1)
EOS%: 2.8 % (ref 0.0–7.0)
Eosinophils Absolute: 0.1 10*3/uL (ref 0.0–0.5)
HEMATOCRIT: 30.7 % — AB (ref 38.4–49.9)
HGB: 9.3 g/dL — ABNORMAL LOW (ref 13.0–17.1)
LYMPH#: 0.6 10*3/uL — AB (ref 0.9–3.3)
LYMPH%: 29.3 % (ref 14.0–49.0)
MCH: 24.7 pg — AB (ref 27.2–33.4)
MCHC: 30.4 g/dL — AB (ref 32.0–36.0)
MCV: 81.4 fL (ref 79.3–98.0)
MONO#: 0.3 10*3/uL (ref 0.1–0.9)
MONO%: 16.2 % — ABNORMAL HIGH (ref 0.0–14.0)
NEUT#: 1 10*3/uL — ABNORMAL LOW (ref 1.5–6.5)
NEUT%: 51.4 % (ref 39.0–75.0)
PLATELETS: 131 10*3/uL — AB (ref 140–400)
RBC: 3.78 10*6/uL — ABNORMAL LOW (ref 4.20–5.82)
RDW: 16.9 % — ABNORMAL HIGH (ref 11.0–14.6)
WBC: 2 10*3/uL — ABNORMAL LOW (ref 4.0–10.3)

## 2015-03-26 LAB — LACTATE DEHYDROGENASE: LDH: 169 U/L (ref 125–245)

## 2015-03-27 LAB — IGG, IGA, IGM
IgA, Qn, Serum: 1059 mg/dL — ABNORMAL HIGH (ref 61–437)
IgG, Qn, Serum: 91 mg/dL — ABNORMAL LOW (ref 700–1600)
IgM, Qn, Serum: 5 mg/dL — ABNORMAL LOW (ref 20–172)

## 2015-03-27 LAB — KAPPA/LAMBDA LIGHT CHAINS
IG KAPPA FREE LIGHT CHAIN: 0.76 mg/L — AB (ref 3.30–19.40)
IG LAMBDA FREE LIGHT CHAIN: 101.73 mg/L — AB (ref 5.71–26.30)
KAPPA/LAMBDA FLC RATIO: 0.01 — AB (ref 0.26–1.65)

## 2015-03-27 LAB — BETA 2 MICROGLOBULIN, SERUM: Beta-2: 2.8 mg/L — ABNORMAL HIGH (ref 0.6–2.4)

## 2015-03-28 ENCOUNTER — Encounter: Payer: Self-pay | Admitting: Gastroenterology

## 2015-03-28 ENCOUNTER — Other Ambulatory Visit: Payer: Medicare Other

## 2015-03-28 ENCOUNTER — Other Ambulatory Visit: Payer: Self-pay | Admitting: *Deleted

## 2015-03-28 ENCOUNTER — Ambulatory Visit (INDEPENDENT_AMBULATORY_CARE_PROVIDER_SITE_OTHER): Payer: Medicare Other | Admitting: Gastroenterology

## 2015-03-28 VITALS — BP 128/68 | HR 72 | Ht 70.5 in | Wt 163.1 lb

## 2015-03-28 DIAGNOSIS — K219 Gastro-esophageal reflux disease without esophagitis: Secondary | ICD-10-CM

## 2015-03-28 DIAGNOSIS — R1314 Dysphagia, pharyngoesophageal phase: Secondary | ICD-10-CM

## 2015-03-28 DIAGNOSIS — C9000 Multiple myeloma not having achieved remission: Secondary | ICD-10-CM

## 2015-03-28 DIAGNOSIS — Z7901 Long term (current) use of anticoagulants: Secondary | ICD-10-CM

## 2015-03-28 MED ORDER — POMALIDOMIDE 4 MG PO CAPS: 4.0000 mg | ORAL_CAPSULE | Freq: Every day | ORAL | Status: DC

## 2015-03-28 NOTE — Progress Notes (Signed)
    History of Present Illness: This is a 70 year old male undergoing treatment for multiple myeloma complaining of difficulty swallowing. He has GERD and was evaluated in January. His symptoms were under good control on twice daily pantoprazole. On Monday he developed difficulty swallowing while eating spicy chicken wings. He had ongoing difficulties with swallowing and regurgitation for about 2 days and then his symptoms spontaneously cleared completely and he is able to swallow completely normally now.  Current Medications, Allergies, Past Medical History, Past Surgical History, Family History and Social History were reviewed in [employer] record.  Physical Exam: General: Well developed, well nourished, no acute distress Head: Normocephalic and atraumatic Eyes:  sclerae anicteric, EOMI Ears: Normal auditory acuity Mouth: No deformity or lesions Lungs: Clear throughout to auscultation Heart: Regular rate and rhythm; no murmurs, rubs or bruits Abdomen: Soft, non tender and non distended. No masses, hepatosplenomegaly or hernias noted. Normal Bowel sounds Musculoskeletal: Symmetrical with no gross deformities  Pulses:  Normal pulses noted Extremities: No clubbing, cyanosis, edema or deformities noted Neurological: Alert oriented x 4, grossly nonfocal Psychological:  Alert and cooperative. Normal mood and affect  Assessment and Recommendations:  1. Acute dysphagia. Possible food impaction that resolved spontaneously, rule out stricture and other disorders. Schedule barium esophagram. Pending findings he may need upper endoscopy for further evaluation.  2. GERD. Continue pantoprazole 40 mg twice daily and follow standard antireflux measures.  3. CRC screening. He states he has an appointment to see Dr. Harrell Lark in a few weeks. Despite our conversation at his office visit in January that he needs to decide to follow with either Dr. Ferdinand Lango or me for his ongoing  gastrointestinal care he continues to plan follow up with both of [place]. In addition we've requested records from Dr. any Ferdinand Lango office and we have not received them. It is not in his best interest to be cared for by 2 different gastroenterologists in this community.  4. Multiple myeloma undergoing treatments under the care of Dr. Earlie Server.

## 2015-03-28 NOTE — Patient Instructions (Signed)
You have been scheduled for a Barium Esophogram at [facility] (1st floor of the hospital) on 04/02/15 at 2:00pm. Please arrive 15 minutes prior to your appointment for registration. Make certain not to have anything to eat or drink 6 hours prior to your test. If you need to reschedule for any reason, please contact radiology at [phone] to do so. __________________________________________________________________ A barium swallow is an examination that concentrates on views of the esophagus. This tends to be a double contrast exam (barium and two liquids which, when combined, create a gas to distend the wall of the oesophagus) or single contrast (non-ionic iodine based). The study is usually tailored to your symptoms so a good history is essential. Attention is paid during the study to the form, structure and configuration of the esophagus, looking for functional disorders (such as aspiration, dysphagia, achalasia, motility and reflux) EXAMINATION You may be asked to change into a gown, depending on the type of swallow being performed. A radiologist and radiographer will perform the procedure. The radiologist will advise you of the type of contrast selected for your procedure and direct you during the exam. You will be asked to stand, sit or lie in several different positions and to hold a small amount of fluid in your mouth before being asked to swallow while the imaging is performed .In some instances you may be asked to swallow barium coated marshmallows to assess the motility of a solid food bolus. The exam can be recorded as a digital or video fluoroscopy procedure. POST PROCEDURE It will take 1-2 days for the barium to pass through your system. To facilitate this, it is important, unless otherwise directed, to increase your fluids for the next 24-48hrs and to resume your normal diet.  This test typically takes about 30 minutes to  perform. __________________________________________________________________________________  Thank you for choosing me and [facility].  Pricilla Riffle. Dagoberto Ligas., MD., [facility]

## 2015-04-02 ENCOUNTER — Ambulatory Visit (HOSPITAL_BASED_OUTPATIENT_CLINIC_OR_DEPARTMENT_OTHER): Payer: Medicare Other | Admitting: Internal Medicine

## 2015-04-02 ENCOUNTER — Encounter: Payer: Self-pay | Admitting: Internal Medicine

## 2015-04-02 ENCOUNTER — Other Ambulatory Visit (HOSPITAL_BASED_OUTPATIENT_CLINIC_OR_DEPARTMENT_OTHER): Payer: Medicare Other

## 2015-04-02 ENCOUNTER — Ambulatory Visit: Payer: Medicare Other

## 2015-04-02 ENCOUNTER — Telehealth: Payer: Self-pay | Admitting: Internal Medicine

## 2015-04-02 ENCOUNTER — Ambulatory Visit (HOSPITAL_COMMUNITY)
Admission: RE | Admit: 2015-04-02 | Discharge: 2015-04-02 | Disposition: A | Discharge status: Home / Self Care | Payer: Medicare Other | Source: Ambulatory Visit | Attending: Gastroenterology | Admitting: Gastroenterology

## 2015-04-02 VITALS — BP 119/61 | HR 60 | Temp 97.9°F | Resp 18 | Ht 70.5 in | Wt 164.2 lb

## 2015-04-02 DIAGNOSIS — T451X5A Adverse effect of antineoplastic and immunosuppressive drugs, initial encounter: Secondary | ICD-10-CM

## 2015-04-02 DIAGNOSIS — D61818 Other pancytopenia: Secondary | ICD-10-CM | POA: Diagnosis not present

## 2015-04-02 DIAGNOSIS — C9000 Multiple myeloma not having achieved remission: Secondary | ICD-10-CM | POA: Insufficient documentation

## 2015-04-02 DIAGNOSIS — R1314 Dysphagia, pharyngoesophageal phase: Secondary | ICD-10-CM | POA: Diagnosis not present

## 2015-04-02 DIAGNOSIS — I8290 Acute embolism and thrombosis of unspecified vein: Secondary | ICD-10-CM | POA: Diagnosis not present

## 2015-04-02 DIAGNOSIS — K222 Esophageal obstruction: Secondary | ICD-10-CM | POA: Insufficient documentation

## 2015-04-02 DIAGNOSIS — R749 Abnormal serum enzyme level, unspecified: Secondary | ICD-10-CM | POA: Diagnosis not present

## 2015-04-02 DIAGNOSIS — Z5111 Encounter for antineoplastic chemotherapy: Secondary | ICD-10-CM

## 2015-04-02 DIAGNOSIS — D6481 Anemia due to antineoplastic chemotherapy: Secondary | ICD-10-CM

## 2015-04-02 LAB — CBC WITH DIFFERENTIAL/PLATELET
BASO%: 2 % (ref 0.0–2.0)
BASOS ABS: 0.1 10*3/uL (ref 0.0–0.1)
EOS ABS: 0 10*3/uL (ref 0.0–0.5)
EOS%: 1 % (ref 0.0–7.0)
HEMATOCRIT: 32.3 % — AB (ref 38.4–49.9)
HEMOGLOBIN: 9.9 g/dL — AB (ref 13.0–17.1)
LYMPH#: 1.3 10*3/uL (ref 0.9–3.3)
LYMPH%: 40.5 % (ref 14.0–49.0)
MCH: 25 pg — ABNORMAL LOW (ref 27.2–33.4)
MCHC: 30.8 g/dL — ABNORMAL LOW (ref 32.0–36.0)
MCV: 81 fL (ref 79.3–98.0)
MONO#: 0.5 10*3/uL (ref 0.1–0.9)
MONO%: 16.2 % — AB (ref 0.0–14.0)
NEUT%: 40.3 % (ref 39.0–75.0)
NEUTROS ABS: 1.3 10*3/uL — AB (ref 1.5–6.5)
Platelets: 196 10*3/uL (ref 140–400)
RBC: 3.98 10*6/uL — ABNORMAL LOW (ref 4.20–5.82)
RDW: 17 % — AB (ref 11.0–14.6)
WBC: 3.2 10*3/uL — AB (ref 4.0–10.3)

## 2015-04-02 LAB — COMPREHENSIVE METABOLIC PANEL
ALBUMIN: 4 g/dL (ref 3.5–5.0)
ALK PHOS: 48 U/L (ref 40–150)
ALT: 13 U/L (ref 0–55)
AST: 13 U/L (ref 5–34)
Anion Gap: 11 mEq/L (ref 3–11)
BILIRUBIN TOTAL: 2.35 mg/dL — AB (ref 0.20–1.20)
BUN: 21.3 mg/dL (ref 7.0–26.0)
CALCIUM: 9.6 mg/dL (ref 8.4–10.4)
CO2: 26 mEq/L (ref 22–29)
CREATININE: 1.6 mg/dL — AB (ref 0.7–1.3)
Chloride: 102 mEq/L (ref 98–109)
EGFR: 50 mL/min/{1.73_m2} — ABNORMAL LOW (ref >90)
GLUCOSE: 151 mg/dL — AB (ref 70–140)
Potassium: 4.2 mEq/L (ref 3.5–5.1)
SODIUM: 139 meq/L (ref 136–145)
TOTAL PROTEIN: 7.2 g/dL (ref 6.4–8.3)

## 2015-04-02 LAB — TECHNOLOGIST REVIEW

## 2015-04-02 NOTE — Telephone Encounter (Signed)
Gave and printed appt sched and avs fo rpt for march °

## 2015-04-02 NOTE — Progress Notes (Signed)
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  2) the venous thrombosis diagnosed in June 2016  PRIOR THERAPY: :  1. Status post 6 cycles of systemic chemotherapy with Revlimid and Decadron, last dose was given 07/21/2010 with very good response. 2. 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 [name].  3. maintenance Revlimid at 10 mg by mouth daily status post 2 months. Therapy began 01/18/2011. 4. maintenance Revlimid at 15 mg by mouth daily with prophylactic dose Coumadin at 2 mg by mouth daily. 5. Systemic chemotherapy with Velcade at 1.3 mg per meter squared given on days 1, 4, 8 and 11 and Doxil at 30 mg per meter square given on day 4 and Decadron 40 mg by mouth on weekly basis given every 3 weeks. Status post 4 cycles. 6. Zometa 4 mg IV every 4 weeks.  7. Velcade 1.3 mg/M2 subcutaneous daily on a weekly basis with Decadron 20 mg by mouth on a weekly basis. First cycle expected on 06/21/2012. S/P 4 cycles. 8. Systemic chemotherapy with Carfilzomib, cyclophosphamide and Decadron. First cycle started on 08/09/2012. He is status post 19 cycles. 9. Systemic chemotherapy with Carfilzomib 36 MG/M2 on days 1, 2, 8, 9, 15 and 16 every 4 weeks, Pomalyst 4 mg by mouth daily for 21 days every 4 weeks and Decadron 40 mg by mouth weekly. First cycle started on 04/10/2014. Status post 12 cycles. Discontinued secondary to disease progression.    CURRENT THERAPY:  1) Zometa every 12 weeks.  3) Xarelto 20 mg by mouth daily, started 07/17/2014.   INTERVAL HISTORY: Eddie Thomas 70 y.o. male returns to the clinic today for followup visit. He is currently on treatment with Carfilzomib, Pomalyst and dexamethasone status post 12 cycles.  He is tolerating his treatment fairly well except for persistent mild pancytopenia, fatigue as well as elevated serum bilirubin. He is scheduled for cataract surgery in one week. He denied having any significant weight loss or night sweats. He has no fever or chills. He has no nausea or vomiting. The patient denied having any significant chest pain, shortness of breath, cough or hemoptysis. The patient had repeat myeloma panel and he is here today for evaluation and discussion of his treatment options.  MEDICAL HISTORY: Past Medical History  Diagnosis Date  . Multiple myeloma   . Diabetes mellitus ([place]) 07/03/2011  . Hypertension 07/03/2011  . Hyperlipidemia 07/03/2011  . Colon polyps 2012  . Gastric ulcer   . Bulging lumbar disc   . Encounter for antineoplastic chemotherapy 03/05/2015    ALLERGIES:  has No Known Allergies.  MEDICATIONS:  Current Outpatient Prescriptions  Medication Sig Dispense Refill  . Cholecalciferol (VITAMIN D-3) 5000 UNITS TABS Take 1 tablet by mouth daily.    [name] dexamethasone (DECADRON) 4 MG tablet TAKE FIVE TABLETS BY MOUTH EVERY WEEK START WITH THE FIRST DOSE OF CHEMOTHERAPY 80 tablet 0  . gabapentin (NEURONTIN) 300 MG capsule Take 300 mg by mouth 3 (three) times daily.    [name] lidocaine-prilocaine (EMLA) cream Apply 1 application topically as needed. Apply one hour before use and cover with plastic wrap. 30 g 1  . lisinopril (PRINIVIL,ZESTRIL) 10 MG tablet Take 5 mg by mouth every evening.     [name] oxyCODONE-acetaminophen (PERCOCET/ROXICET)  5-325 MG tablet Take 1 tablet by mouth every 6 (six) hours as needed for severe pain. 30 tablet 0  . pantoprazole (PROTONIX) 40 MG tablet TAKE ONE TABLET BY MOUTH TWICE DAILY 60 tablet 11  . pioglitazone (ACTOS) 15 MG tablet Take 15 mg by mouth daily. Reported on 01/16/2015    . pomalidomide (POMALYST) 4 MG capsule Take 1 capsule (4 mg total) by mouth daily. Take with water on days 1-21. Repeat every 28 days. 21 capsule 0  .  simvastatin (ZOCOR) 10 MG tablet Take 10 mg by mouth at bedtime.      . temazepam (RESTORIL) 15 MG capsule Take 15 mg by mouth at bedtime as needed for sleep. Reported on 01/16/2015    . valACYclovir (VALTREX) 500 MG tablet TAKE ONE CAPLET BY MOUTH ONCE DAILY 30 tablet 3  . XARELTO 20 MG TABS tablet TAKE ONE TABLET BY MOUTH ONCE DAILY WITH SUPPER 30 tablet 0  . prochlorperazine (COMPAZINE) 10 MG tablet Take 10 mg by mouth every 6 (six) hours as needed for nausea or vomiting. Reported on 04/02/2015    . VIAGRA 50 MG tablet Take 50 mg by mouth daily as needed for erectile dysfunction.      No current facility-administered medications for this visit.    SURGICAL HISTORY:  Past Surgical History  Procedure Laterality Date  . Limbal stem cell transplant    . Humerus fracture surgery      right  . 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    REVIEW OF SYSTEMS:  Constitutional: positive for fatigue Eyes: negative Ears, nose, mouth, throat, and face: negative Respiratory: negative Cardiovascular: negative Gastrointestinal: negative Genitourinary:negative Integument/breast: negative Hematologic/lymphatic: negative Musculoskeletal:negative Neurological: negative Behavioral/Psych: negative Endocrine: negative Allergic/Immunologic: negative   PHYSICAL EXAMINATION: General appearance: alert, cooperative and no distress Head: Normocephalic, without obvious abnormality, atraumatic Neck: no adenopathy, no JVD, supple, symmetrical, trachea midline and thyroid not enlarged, symmetric, no tenderness/mass/nodules Lymph nodes: Cervical, supraclavicular, and axillary nodes normal. Resp: clear to auscultation bilaterally Back: symmetric, no curvature. ROM normal. No CVA tenderness. Cardio: regular rate and rhythm, S1, S2 normal, no murmur, click, rub or gallop GI: soft, non-tender; bowel sounds normal; no masses,  no organomegaly Extremities: extremities normal, atraumatic, no  cyanosis or edema Neurologic: Alert and oriented X 3, normal strength and tone. Normal symmetric reflexes. Normal coordination and gait  ECOG PERFORMANCE STATUS: 1 - Symptomatic but completely ambulatory  Blood pressure 119/61, pulse 60, temperature 97.9 F (36.6 C), temperature source Oral, resp. rate 18, height 5' 10.5" (1.791 m), weight 164 lb 3.2 oz (74.481 kg), SpO2 100 %.  LABORATORY DATA: Lab Results  Component Value Date   WBC 3.2* 04/02/2015   HGB 9.9* 04/02/2015   HCT 32.3* 04/02/2015   MCV 81.0 04/02/2015   PLT 196 04/02/2015      Chemistry      Component Value Date/Time   NA 138 03/26/2015 1128   NA 139 08/30/2013 0935   K 4.0 03/26/2015 1128   K 4.1 08/30/2013 0935   CL 101 08/30/2013 0935   CL 103 07/12/2012 0907   CO2 23 03/26/2015 1128   CO2 26 08/30/2013 0935   BUN 10.5 03/26/2015 1128   BUN 11 08/30/2013 0935   CREATININE 1.2 03/26/2015 1128   CREATININE 1.03 08/30/2013 0935      Component Value Date/Time   CALCIUM 9.3 03/26/2015 1128   CALCIUM 9.2 08/30/2013 0935   ALKPHOS 42 03/26/2015 1128  ALKPHOS 39 08/30/2013 0935   AST 8 03/26/2015 1128   AST 13 08/30/2013 0935   ALT 10 03/26/2015 1128   ALT 9 08/30/2013 0935   BILITOT 2.56* 03/26/2015 1128   BILITOT 1.3* 08/30/2013 0935     Other lab results: Beta-2 microglobulin 2.80, free kappa light chain 0.76, free lambda light chain 101.73 with a kappa/lambda ratio of 0.01. IgG 91, IgA 1059 and IgM 5.  RADIOGRAPHIC STUDIES: No results found.  ASSESSMENT AND PLAN: This is a very pleasant 70 years old [place] American male with history of multiple myeloma currently undergoing systemic chemotherapy with Carfilzomib, cyclophosphamide and Decadron status post 19 cycles. This was discontinued secondary to disease progression. The patient was started on systemic chemotherapy with Carfilzomib, Pomalyst and Decadron status post 12 cycles. I recommended for the patient to proceed with cycle #12 today as  scheduled. Unfortunately the recent myeloma panel showed evidence for disease progression with doubling of the free lambda light chain as well as increase in the IgA. I discussed the lab result with the patient today. I recommended for him to discontinue his current treatment with Carfilzomib, Pomalyst and Decadron at this point. I discussed with the patient other treatment options including treatment with Daratumumab in combination with either Velcade and Decadron or Revlimid and Decadron. He is interested in treatment with Daratumumab, Velcade and Decadron. I also gave the patient the option of a second opinion at [facility] to discuss any other treatment options. He agreed to this option and I will refer him to see either Dr. Evelene Croon or one of her partner.  I will see him back for follow-up visit in 2 weeks for reevaluation and more detailed discussion of his treatment options after his visit at [facility]. He'll continue on Zometa every 3 months. For the venous thrombosis, he will continue on Xarelto 20 mg by mouth daily. He was advised to call immediately if he has any concerning symptoms in the interval.  The patient voices understanding of current disease status and treatment options and is in agreement with the current care plan. All questions were answered. The patient knows to call the clinic with any problems, questions or concerns. We can certainly see the patient much sooner if necessary.  Disclaimer: This note was dictated with voice recognition software. Similar sounding words can inadvertently be transcribed and may not be corrected upon review.

## 2015-04-03 ENCOUNTER — Ambulatory Visit: Payer: Medicare Other

## 2015-04-03 ENCOUNTER — Telehealth: Payer: Self-pay | Admitting: Internal Medicine

## 2015-04-03 NOTE — Telephone Encounter (Signed)
Faxed pt medical records to [facility].  After review she will call pt with appt.

## 2015-04-06 ENCOUNTER — Other Ambulatory Visit: Payer: Self-pay | Admitting: Internal Medicine

## 2015-04-09 ENCOUNTER — Other Ambulatory Visit: Payer: Medicare Other

## 2015-04-09 ENCOUNTER — Ambulatory Visit: Payer: Medicare Other

## 2015-04-10 ENCOUNTER — Ambulatory Visit: Payer: Medicare Other

## 2015-04-16 ENCOUNTER — Other Ambulatory Visit: Payer: Medicare Other

## 2015-04-16 ENCOUNTER — Encounter: Payer: Self-pay | Admitting: Oncology

## 2015-04-16 ENCOUNTER — Telehealth: Payer: Self-pay | Admitting: Hematology

## 2015-04-16 ENCOUNTER — Other Ambulatory Visit (HOSPITAL_BASED_OUTPATIENT_CLINIC_OR_DEPARTMENT_OTHER): Payer: Medicare Other

## 2015-04-16 ENCOUNTER — Other Ambulatory Visit: Payer: Self-pay | Admitting: Medical Oncology

## 2015-04-16 ENCOUNTER — Ambulatory Visit: Payer: Medicare Other

## 2015-04-16 ENCOUNTER — Ambulatory Visit (HOSPITAL_BASED_OUTPATIENT_CLINIC_OR_DEPARTMENT_OTHER): Payer: Medicare Other | Admitting: Oncology

## 2015-04-16 VITALS — BP 135/61 | HR 76 | Temp 98.2°F | Resp 18 | Ht 70.5 in | Wt 166.4 lb

## 2015-04-16 DIAGNOSIS — R252 Cramp and spasm: Secondary | ICD-10-CM

## 2015-04-16 DIAGNOSIS — C9000 Multiple myeloma not having achieved remission: Secondary | ICD-10-CM | POA: Diagnosis present

## 2015-04-16 DIAGNOSIS — D6481 Anemia due to antineoplastic chemotherapy: Secondary | ICD-10-CM

## 2015-04-16 DIAGNOSIS — T451X5A Adverse effect of antineoplastic and immunosuppressive drugs, initial encounter: Secondary | ICD-10-CM

## 2015-04-16 DIAGNOSIS — Z5111 Encounter for antineoplastic chemotherapy: Secondary | ICD-10-CM

## 2015-04-16 LAB — COMPREHENSIVE METABOLIC PANEL
ALBUMIN: 3.5 g/dL (ref 3.5–5.0)
ALK PHOS: 41 U/L (ref 40–150)
ALT: 9 U/L (ref 0–55)
AST: 20 U/L (ref 5–34)
Anion Gap: 11 mEq/L (ref 3–11)
BILIRUBIN TOTAL: 1.44 mg/dL — AB (ref 0.20–1.20)
BUN: 22.3 mg/dL (ref 7.0–26.0)
CO2: 26 meq/L (ref 22–29)
CREATININE: 2.3 mg/dL — AB (ref 0.7–1.3)
Calcium: 9.8 mg/dL (ref 8.4–10.4)
Chloride: 105 mEq/L (ref 98–109)
EGFR: 33 mL/min/{1.73_m2} — AB (ref >90)
GLUCOSE: 139 mg/dL (ref 70–140)
Potassium: 4.1 mEq/L (ref 3.5–5.1)
SODIUM: 141 meq/L (ref 136–145)
TOTAL PROTEIN: 7.5 g/dL (ref 6.4–8.3)

## 2015-04-16 LAB — MAGNESIUM: Magnesium: 1.5 mg/dl (ref 1.5–2.5)

## 2015-04-16 LAB — CBC WITH DIFFERENTIAL/PLATELET
BASO%: 0.5 % (ref 0.0–2.0)
Basophils Absolute: 0 10*3/uL (ref 0.0–0.1)
EOS ABS: 0 10*3/uL (ref 0.0–0.5)
EOS%: 0.5 % (ref 0.0–7.0)
HCT: 25.9 % — ABNORMAL LOW (ref 38.4–49.9)
HEMOGLOBIN: 8 g/dL — AB (ref 13.0–17.1)
LYMPH%: 19.3 % (ref 14.0–49.0)
MCH: 25 pg — ABNORMAL LOW (ref 27.2–33.4)
MCHC: 30.9 g/dL — ABNORMAL LOW (ref 32.0–36.0)
MCV: 81 fL (ref 79.3–98.0)
MONO#: 0.4 10*3/uL (ref 0.1–0.9)
MONO%: 8 % (ref 0.0–14.0)
NEUT%: 71.7 % (ref 39.0–75.0)
NEUTROS ABS: 3.4 10*3/uL (ref 1.5–6.5)
Platelets: 132 10*3/uL — ABNORMAL LOW (ref 140–400)
RBC: 3.2 10*6/uL — AB (ref 4.20–5.82)
RDW: 16 % — AB (ref 11.0–14.6)
WBC: 4.8 10*3/uL (ref 4.0–10.3)
lymph#: 0.9 10*3/uL (ref 0.9–3.3)

## 2015-04-16 LAB — LACTATE DEHYDROGENASE: LDH: 249 U/L — ABNORMAL HIGH (ref 125–245)

## 2015-04-16 NOTE — Telephone Encounter (Signed)
per pof to sch pt appt-gave pt copy of avs °

## 2015-04-16 NOTE — Progress Notes (Signed)
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  2) the venous thrombosis diagnosed in June 2016  PRIOR THERAPY: :  1. Status post 6 cycles of systemic chemotherapy with Revlimid and Decadron, last dose was given 07/21/2010 with very good response. 2. 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 [name].  3. maintenance Revlimid at 10 mg by mouth daily status post 2 months. Therapy began 01/18/2011. 4. maintenance Revlimid at 15 mg by mouth daily with prophylactic dose Coumadin at 2 mg by mouth daily. 5. Systemic chemotherapy with Velcade at 1.3 mg per meter squared given on days 1, 4, 8 and 11 and Doxil at 30 mg per meter square given on day 4 and Decadron 40 mg by mouth on weekly basis given every 3 weeks. Status post 4 cycles. 6. Zometa 4 mg IV every 4 weeks.  7. Velcade 1.3 mg/M2 subcutaneous daily on a weekly basis with Decadron 20 mg by mouth on a weekly basis. First cycle expected on 06/21/2012. S/P 4 cycles. 8. Systemic chemotherapy with Carfilzomib, cyclophosphamide and Decadron. First cycle started on 08/09/2012. He is status post 19 cycles. 9. Systemic chemotherapy with Carfilzomib 36 MG/M2 on days 1, 2, 8, 9, 15 and 16 every 4 weeks, Pomalyst 4 mg by mouth daily for 21 days every 4 weeks and Decadron 40 mg by mouth weekly. First cycle started on 04/10/2014. Status post 12 cycles. Discontinued secondary to disease progression.    CURRENT THERAPY:  1) Zometa every 12 weeks.  3) Xarelto 20 mg by mouth daily, started 07/17/2014.   INTERVAL HISTORY: Eddie Thomas 70 y.o. male returns to the clinic today for followup visit. Treatment has been stopped due to disease progression. Awaiting a second opinion from [facility].  Scheduled for 3/29. He denied having any significant weight loss or night sweats. He has no fever or chills. He has no nausea or vomiting. The patient denied having any significant chest pain, shortness of breath, cough or hemoptysis. No difficulty voiding. No hematuria. Reports cramping in his hands and feet at times. He is here today for evaluation and to follow up on his visit with [facility] (has not had this yet).  MEDICAL HISTORY: Past Medical History  Diagnosis Date  . Multiple myeloma   . Diabetes mellitus ([place]) 07/03/2011  . Hypertension 07/03/2011  . Hyperlipidemia 07/03/2011  . Colon polyps 2012  . Gastric ulcer   . Bulging lumbar disc   . Encounter for antineoplastic chemotherapy 03/05/2015    ALLERGIES:  has No Known Allergies.  MEDICATIONS:  Current Outpatient Prescriptions  Medication Sig Dispense Refill  . Cholecalciferol (VITAMIN D-3) 5000 UNITS TABS Take 1 tablet by mouth daily.    [name] gabapentin (NEURONTIN) 300 MG capsule Take 300 mg by mouth 3 (three) times daily.    [name] lidocaine-prilocaine (EMLA) cream Apply 1 application topically as needed. Apply one hour before use and cover with plastic wrap. 30 g 1  . lisinopril (PRINIVIL,ZESTRIL) 10 MG tablet Take 5 mg by mouth every evening.     [name] oxyCODONE-acetaminophen (PERCOCET/ROXICET) 5-325 MG tablet Take 1 tablet by mouth every 6 (six) hours as needed for severe pain. 30 tablet 0  . pantoprazole (PROTONIX) 40 MG tablet TAKE ONE TABLET BY MOUTH TWICE DAILY  60 tablet 11  . pioglitazone (ACTOS) 15 MG tablet Take 15 mg by mouth daily. Reported on 01/16/2015    . pomalidomide (POMALYST) 4 MG capsule Take 1 capsule (4 mg total) by mouth daily. Take with water on days 1-21. Repeat every 28 days. 21 capsule 0  . prochlorperazine (COMPAZINE) 10 MG tablet Take 10 mg by mouth every 6 (six) hours as needed for nausea or vomiting. Reported on 04/02/2015    . simvastatin (ZOCOR) 10 MG tablet Take 10 mg by mouth at bedtime.      . temazepam  (RESTORIL) 15 MG capsule Take 15 mg by mouth at bedtime as needed for sleep. Reported on 01/16/2015    . valACYclovir (VALTREX) 500 MG tablet TAKE ONE CAPLET BY MOUTH ONCE DAILY 30 tablet 3  . VIAGRA 50 MG tablet Take 50 mg by mouth daily as needed for erectile dysfunction.     Alveda Reasons 20 MG TABS tablet TAKE ONE TABLET BY MOUTH ONCE DAILY WITH SUPPER 30 tablet 0   No current facility-administered medications for this visit.    SURGICAL HISTORY:  Past Surgical History  Procedure Laterality Date  . Limbal stem cell transplant    . Humerus fracture surgery      right  . 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    REVIEW OF SYSTEMS:  Constitutional: positive for fatigue Eyes: negative Ears, nose, mouth, throat, and face: negative Respiratory: negative Cardiovascular: negative Gastrointestinal: negative Genitourinary:negative Integument/breast: negative Hematologic/lymphatic: negative Musculoskeletal:negative Neurological: negative Behavioral/Psych: negative Endocrine: negative Allergic/Immunologic: negative   PHYSICAL EXAMINATION: General appearance: alert, cooperative and no distress Head: Normocephalic, without obvious abnormality, atraumatic Neck: no adenopathy, no JVD, supple, symmetrical, trachea midline and thyroid not enlarged, symmetric, no tenderness/mass/nodules Lymph nodes: Cervical, supraclavicular, and axillary nodes normal. Resp: clear to auscultation bilaterally Back: symmetric, no curvature. ROM normal. No CVA tenderness. Cardio: regular rate and rhythm, S1, S2 normal, no murmur, click, rub or gallop GI: soft, non-tender; bowel sounds normal; no masses,  no organomegaly Extremities: extremities normal, atraumatic, no cyanosis or edema Neurologic: Alert and oriented X 3, normal strength and tone. Normal symmetric reflexes. Normal coordination and gait  ECOG PERFORMANCE STATUS: 1 - Symptomatic but completely ambulatory  Blood pressure 135/61,  pulse 76, temperature 98.2 F (36.8 C), temperature source Oral, resp. rate 18, height 5' 10.5" (1.791 m), weight 166 lb 6.4 oz (75.479 kg), SpO2 100 %.  LABORATORY DATA: Lab Results  Component Value Date   WBC 4.8 04/16/2015   HGB 8.0* 04/16/2015   HCT 25.9* 04/16/2015   MCV 81.0 04/16/2015   PLT 132* 04/16/2015      Chemistry      Component Value Date/Time   NA 141 04/16/2015 0908   NA 139 08/30/2013 0935   K 4.1 04/16/2015 0908   K 4.1 08/30/2013 0935   CL 101 08/30/2013 0935   CL 103 07/12/2012 0907   CO2 26 04/16/2015 0908   CO2 26 08/30/2013 0935   BUN 22.3 04/16/2015 0908   BUN 11 08/30/2013 0935   CREATININE 2.3* 04/16/2015 0908   CREATININE 1.03 08/30/2013 0935      Component Value Date/Time   CALCIUM 9.8 04/16/2015 0908   CALCIUM 9.2 08/30/2013 0935   ALKPHOS 41 04/16/2015 0908   ALKPHOS 39 08/30/2013 0935   AST 20 04/16/2015 0908   AST 13 08/30/2013 0935   ALT 9 04/16/2015 0908   ALT 9 08/30/2013 0935   BILITOT 1.44* 04/16/2015 0908  BILITOT 1.3* 08/30/2013 0935     Other lab results: 03/26/2015 - Beta-2 microglobulin 2.80, free kappa light chain 0.76, free lambda light chain 101.73 with a kappa/lambda ratio of 0.01. IgG 91, IgA 1059 and IgM 5.  RADIOGRAPHIC STUDIES: No results found.  ASSESSMENT AND PLAN: This is a very pleasant 70 year old African American male with history of multiple myeloma currently undergoing systemic chemotherapy with Carfilzomib, cyclophosphamide and Decadron status post 19 cycles. This was discontinued secondary to disease progression. The patient was started on systemic chemotherapy with Carfilzomib, Pomalyst and Decadron status post 12 cycles. Unfortunately the recent myeloma panel showed evidence for disease progression with doubling of the free lambda light chain as well as increase in the IgA. This chemotherapy regimen was discontinued secondary to disease progression.  Dr. Julien Nordmann has previously discussed with the patient  other treatment options including treatment with Daratumumab in combination with either Velcade and Decadron or Revlimid and Decadron. He is interested in treatment with Daratumumab, Velcade and Decadron. He has a second opinion at [facility] to discuss any other treatment options on 3/29.   Will add on Magnesium level due to cramping. Discussed hydration.  We will see him back for a follow-up visit in 1 week for reevaluation and more detailed discussion of his treatment options after his visit at [facility].  He will  continue on Zometa every 3 months. Due May 8th.   For the venous thrombosis, he will continue on Xarelto 20 mg by mouth daily.  Plan reviewed with Dr. Julien Nordmann.   He was advised to call immediately if he has any concerning symptoms in the interval.  The patient voices understanding of current disease status and treatment options and is in agreement with the current care plan. All questions were answered. The patient knows to call the clinic with any problems, questions or concerns. We can certainly see the patient much sooner if necessary.  Eddie Bussing, DNP, AGPCNP-BC, AOCNP

## 2015-04-17 ENCOUNTER — Ambulatory Visit: Payer: Medicare Other

## 2015-04-18 ENCOUNTER — Other Ambulatory Visit: Payer: Self-pay | Admitting: Internal Medicine

## 2015-04-18 ENCOUNTER — Telehealth: Payer: Self-pay | Admitting: Medical Oncology

## 2015-04-18 DIAGNOSIS — C9000 Multiple myeloma not having achieved remission: Secondary | ICD-10-CM

## 2015-04-18 MED ORDER — DEXAMETHASONE 4 MG PO TABS: ORAL_TABLET | ORAL | Status: DC

## 2015-04-18 NOTE — Telephone Encounter (Signed)
Eddie Thomas called and stated Dr Barbera Setters wants to start pt on Dara, velcade, dex this week . If we cannot do it they will give him hs first tx. I called Eddie Thomas back and left her a message that  we cannot administer new tx this week .

## 2015-04-19 ENCOUNTER — Other Ambulatory Visit: Payer: Self-pay | Admitting: Internal Medicine

## 2015-04-19 ENCOUNTER — Telehealth: Payer: Self-pay

## 2015-04-19 ENCOUNTER — Telehealth: Payer: Self-pay | Admitting: *Deleted

## 2015-04-19 ENCOUNTER — Telehealth: Payer: Self-pay | Admitting: Internal Medicine

## 2015-04-19 NOTE — Telephone Encounter (Signed)
Dr. Julien Nordmann,  Eddie Thomas is scheduled for an upper endoscopy with dilation with Dr. Fuller Plan on 05/16/15.  He is currently on Xeralto.  May he hold this for 2 days prior to the procedure?

## 2015-04-19 NOTE — Telephone Encounter (Signed)
-----   Message from Ladene Artist, MD sent at 04/19/2015  1:58 PM EDT ----- Yes needs to hold Xarelto for at least 2 days for EGD/dilation.    ----- Message -----    From: Marlon Pel, RN    Sent: 04/19/2015   1:53 PM      To: Ladene Artist, MD  Dr. Fuller Plan you had me schedule patient for EGD dil.  I did not notice at the time he is on xeralto for history of DVT.  I assume you want that held?  Please advise and I will communicate with Dr. Earlie Server.   Carsin Randazzo

## 2015-04-19 NOTE — Telephone Encounter (Signed)
Per staff message and POF I have scheduled appts. Advised scheduler of appts. JMW  

## 2015-04-19 NOTE — Telephone Encounter (Signed)
OK to hold Xarelto for 2 days.  

## 2015-04-19 NOTE — Telephone Encounter (Signed)
s.w. pt and advised on April appt.....pt ok and aware °

## 2015-04-23 ENCOUNTER — Other Ambulatory Visit (HOSPITAL_BASED_OUTPATIENT_CLINIC_OR_DEPARTMENT_OTHER): Payer: Medicare Other

## 2015-04-23 ENCOUNTER — Other Ambulatory Visit: Payer: Self-pay | Admitting: *Deleted

## 2015-04-23 ENCOUNTER — Encounter: Payer: Self-pay | Admitting: Internal Medicine

## 2015-04-23 ENCOUNTER — Ambulatory Visit (HOSPITAL_BASED_OUTPATIENT_CLINIC_OR_DEPARTMENT_OTHER): Payer: Medicare Other | Admitting: Internal Medicine

## 2015-04-23 ENCOUNTER — Telehealth: Payer: Self-pay | Admitting: Internal Medicine

## 2015-04-23 ENCOUNTER — Ambulatory Visit (HOSPITAL_COMMUNITY): Payer: Medicare Other

## 2015-04-23 ENCOUNTER — Ambulatory Visit (HOSPITAL_COMMUNITY)
Admission: RE | Admit: 2015-04-23 | Discharge: 2015-04-23 | Disposition: A | Discharge status: Home / Self Care | Payer: Medicare Other | Source: Ambulatory Visit | Attending: Internal Medicine | Admitting: Internal Medicine

## 2015-04-23 ENCOUNTER — Ambulatory Visit (HOSPITAL_BASED_OUTPATIENT_CLINIC_OR_DEPARTMENT_OTHER): Payer: Medicare Other

## 2015-04-23 VITALS — BP 130/60 | HR 74 | Temp 98.2°F | Resp 20 | Ht 70.0 in | Wt 166.1 lb

## 2015-04-23 VITALS — BP 124/56 | HR 68 | Temp 98.1°F | Resp 18

## 2015-04-23 DIAGNOSIS — D649 Anemia, unspecified: Secondary | ICD-10-CM

## 2015-04-23 DIAGNOSIS — I8290 Acute embolism and thrombosis of unspecified vein: Secondary | ICD-10-CM

## 2015-04-23 DIAGNOSIS — C9000 Multiple myeloma not having achieved remission: Secondary | ICD-10-CM

## 2015-04-23 DIAGNOSIS — D630 Anemia in neoplastic disease: Secondary | ICD-10-CM | POA: Diagnosis not present

## 2015-04-23 DIAGNOSIS — C9002 Multiple myeloma in relapse: Secondary | ICD-10-CM

## 2015-04-23 DIAGNOSIS — R5383 Other fatigue: Secondary | ICD-10-CM

## 2015-04-23 DIAGNOSIS — Z5111 Encounter for antineoplastic chemotherapy: Secondary | ICD-10-CM

## 2015-04-23 DIAGNOSIS — Z5112 Encounter for antineoplastic immunotherapy: Secondary | ICD-10-CM | POA: Diagnosis present

## 2015-04-23 DIAGNOSIS — D6481 Anemia due to antineoplastic chemotherapy: Secondary | ICD-10-CM

## 2015-04-23 DIAGNOSIS — T451X5A Adverse effect of antineoplastic and immunosuppressive drugs, initial encounter: Secondary | ICD-10-CM

## 2015-04-23 LAB — CBC WITH DIFFERENTIAL/PLATELET
BASO%: 0 % (ref 0.0–2.0)
BASOS ABS: 0 10*3/uL (ref 0.0–0.1)
EOS%: 0 % (ref 0.0–7.0)
Eosinophils Absolute: 0 10*3/uL (ref 0.0–0.5)
HEMATOCRIT: 24.3 % — AB (ref 38.4–49.9)
HGB: 7.6 g/dL — ABNORMAL LOW (ref 13.0–17.1)
LYMPH#: 0.3 10*3/uL — AB (ref 0.9–3.3)
LYMPH%: 10.9 % — ABNORMAL LOW (ref 14.0–49.0)
MCH: 24.8 pg — ABNORMAL LOW (ref 27.2–33.4)
MCHC: 31.3 g/dL — ABNORMAL LOW (ref 32.0–36.0)
MCV: 79.4 fL (ref 79.3–98.0)
MONO#: 0.2 10*3/uL (ref 0.1–0.9)
MONO%: 7.8 % (ref 0.0–14.0)
NEUT%: 81.3 % — AB (ref 39.0–75.0)
NEUTROS ABS: 2.1 10*3/uL (ref 1.5–6.5)
NRBC: 0 % (ref 0–0)
PLATELETS: 104 10*3/uL — AB (ref 140–400)
RBC: 3.06 10*6/uL — AB (ref 4.20–5.82)
RDW: 15.2 % — AB (ref 11.0–14.6)
WBC: 2.6 10*3/uL — ABNORMAL LOW (ref 4.0–10.3)

## 2015-04-23 LAB — COMPREHENSIVE METABOLIC PANEL
ALT: 10 U/L (ref 0–55)
ANION GAP: 11 meq/L (ref 3–11)
AST: 20 U/L (ref 5–34)
Albumin: 3.4 g/dL — ABNORMAL LOW (ref 3.5–5.0)
Alkaline Phosphatase: 39 U/L — ABNORMAL LOW (ref 40–150)
BILIRUBIN TOTAL: 1.28 mg/dL — AB (ref 0.20–1.20)
BUN: 22.6 mg/dL (ref 7.0–26.0)
CO2: 29 mEq/L (ref 22–29)
CREATININE: 1.7 mg/dL — AB (ref 0.7–1.3)
Calcium: 9.9 mg/dL (ref 8.4–10.4)
Chloride: 102 mEq/L (ref 98–109)
EGFR: 46 mL/min/{1.73_m2} — ABNORMAL LOW (ref >90)
Glucose: 94 mg/dl (ref 70–140)
Potassium: 3.6 mEq/L (ref 3.5–5.1)
Sodium: 142 mEq/L (ref 136–145)
TOTAL PROTEIN: 7.5 g/dL (ref 6.4–8.3)

## 2015-04-23 MED ORDER — BORTEZOMIB CHEMO SQ INJECTION 3.5 MG (2.5MG/ML)
1.3000 mg/m2 | Freq: Once | INTRAMUSCULAR | Status: AC
  Administered 2015-04-23: 2.5 mg via SUBCUTANEOUS
  Filled 2015-04-23: qty 2.5

## 2015-04-23 MED ORDER — PROCHLORPERAZINE MALEATE 10 MG PO TABS
ORAL_TABLET | ORAL | Status: AC
  Filled 2015-04-23: qty 1

## 2015-04-23 MED ORDER — PROCHLORPERAZINE MALEATE 10 MG PO TABS
10.0000 mg | ORAL_TABLET | Freq: Once | ORAL | Status: AC
  Administered 2015-04-23: 10 mg via ORAL

## 2015-04-23 NOTE — Progress Notes (Signed)
[place] [phone]   Fax:(336) [phone]  OFFICE PROGRESS NOTE  Eddie Thomas [address] 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  2) the venous thrombosis diagnosed in June 2016  PRIOR THERAPY: :  1. Status post 6 cycles of systemic chemotherapy with Revlimid and Decadron, last dose was given 07/21/2010 with very good response. 2. 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 [name].  3. maintenance Revlimid at 10 mg by mouth daily status post 2 months. Therapy began 01/18/2011. 4. maintenance Revlimid at 15 mg by mouth daily with prophylactic dose Coumadin at 2 mg by mouth daily. 5. Systemic chemotherapy with Velcade at 1.3 mg per meter squared given on days 1, 4, 8 and 11 and Doxil at 30 mg per meter square given on day 4 and Decadron 40 mg by mouth on weekly basis given every 3 weeks. Status post 4 cycles. 6. Zometa 4 mg IV every 4 weeks.  7. Velcade 1.3 mg/M2 subcutaneous daily on a weekly basis with Decadron 20 mg by mouth on a weekly basis. First cycle expected on 06/21/2012. S/P 4 cycles. 8. Systemic chemotherapy with Carfilzomib, cyclophosphamide and Decadron. First cycle started on 08/09/2012. He is status post 19 cycles. 9. Systemic chemotherapy with Carfilzomib 36 MG/M2 on days 1, 2, 8, 9, 15 and 16 every 4 weeks, Pomalyst 4 mg by mouth daily for 21 days every 4 weeks and Decadron 40 mg by mouth weekly. First cycle started on 04/10/2014. Status post 12 cycles. Discontinued secondary to disease progression.    CURRENT THERAPY:  1) Daratumumab 16 mg/kg weekly, Velcade 1.3 MG/M2 on days 1, 4, 8 and 11 every 3 weeks in addition to Decadron 20 mg on days 1, 2, 4, 5, 8, 9, 11 and 12. He started the first dose on 04/20/2015. 2) Zometa every 12 weeks.  3) Xarelto 20 mg by mouth daily, started  07/17/2014.   INTERVAL HISTORY: Eddie Thomas 70 y.o. male returns to the clinic today for followup visit. He completed treatment with Carfilzomib, Pomalyst and dexamethasone status post 12 cycles. This was discontinued secondary to disease progression. I discussed with the patient several treatment options during his last visit including treatment with Daratumumab, Velcade and Decadron. He was also seen by Dr. Ralene Bathe at [facility] for a second opinion and she agreed with the current plan. He started the first dose of Daratumumab on 04/20/2015 at [facility]. He is here today to resume her systemic chemotherapy with day 4 of the first cycle. He tolerated the first dose of his treatment well except for mild fatigue. He denied having any significant weight loss or night sweats. He has no fever or chills. He has no nausea or vomiting. The patient denied having any significant chest pain, shortness of breath, cough or hemoptysis.   MEDICAL HISTORY: Past Medical History  Diagnosis Date  . Multiple myeloma   . Diabetes mellitus ([place]) 07/03/2011  . Hypertension 07/03/2011  . Hyperlipidemia 07/03/2011  . Colon polyps 2012  . Gastric ulcer   . Bulging lumbar disc   . Encounter for antineoplastic chemotherapy 03/05/2015    ALLERGIES:  has No Known Allergies.  MEDICATIONS:  Current Outpatient Prescriptions  Medication Sig Dispense Refill  . Cholecalciferol (VITAMIN D-3) 5000 UNITS TABS Take 1 tablet by mouth daily.    [name] dexamethasone (  DECADRON) 4 MG tablet 5 tab po on days 1,2,4,5,8,9,11 and 12 every 3 weeks, start first day of chemo. 40 tablet 4  . gabapentin (NEURONTIN) 300 MG capsule Take 300 mg by mouth 3 (three) times daily.    [name] lidocaine-prilocaine (EMLA) cream Apply 1 application topically as needed. Apply one hour before use and cover with plastic wrap. 30 g 1  . lisinopril (PRINIVIL,ZESTRIL) 10 MG tablet Take 5 mg by mouth every evening.     [name] lisinopril-hydrochlorothiazide  (PRINZIDE,ZESTORETIC) 20-12.5 MG tablet Take by mouth.    . montelukast (SINGULAIR) 10 MG tablet Take 10 mg by mouth.    . oxyCODONE-acetaminophen (PERCOCET/ROXICET) 5-325 MG tablet Take 1 tablet by mouth every 6 (six) hours as needed for severe pain. 30 tablet 0  . pantoprazole (PROTONIX) 40 MG tablet TAKE ONE TABLET BY MOUTH TWICE DAILY 60 tablet 11  . pioglitazone (ACTOS) 15 MG tablet Take 15 mg by mouth daily. Reported on 01/16/2015    . prochlorperazine (COMPAZINE) 10 MG tablet Take 10 mg by mouth every 6 (six) hours as needed for nausea or vomiting. Reported on 04/02/2015    . simvastatin (ZOCOR) 10 MG tablet Take 10 mg by mouth at bedtime.      . temazepam (RESTORIL) 15 MG capsule Take 15 mg by mouth at bedtime as needed for sleep. Reported on 01/16/2015    . valACYclovir (VALTREX) 500 MG tablet TAKE ONE CAPLET BY MOUTH ONCE DAILY 30 tablet 3  . VIAGRA 50 MG tablet Take 50 mg by mouth daily as needed for erectile dysfunction.     Alveda Reasons 20 MG TABS tablet TAKE ONE TABLET BY MOUTH ONCE DAILY WITH SUPPER 30 tablet 0   No current facility-administered medications for this visit.    SURGICAL HISTORY:  Past Surgical History  Procedure Laterality Date  . Limbal stem cell transplant    . Humerus fracture surgery      right  . 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    REVIEW OF SYSTEMS:  Constitutional: positive for fatigue Eyes: negative Ears, nose, mouth, throat, and face: negative Respiratory: negative Cardiovascular: negative Gastrointestinal: negative Genitourinary:negative Integument/breast: negative Hematologic/lymphatic: negative Musculoskeletal:negative Neurological: negative Behavioral/Psych: negative Endocrine: negative Allergic/Immunologic: negative   PHYSICAL EXAMINATION: General appearance: alert, cooperative and no distress Head: Normocephalic, without obvious abnormality, atraumatic Neck: no adenopathy, no JVD, supple, symmetrical, trachea  midline and thyroid not enlarged, symmetric, no tenderness/mass/nodules Lymph nodes: Cervical, supraclavicular, and axillary nodes normal. Resp: clear to auscultation bilaterally Back: symmetric, no curvature. ROM normal. No CVA tenderness. Cardio: regular rate and rhythm, S1, S2 normal, no murmur, click, rub or gallop GI: soft, non-tender; bowel sounds normal; no masses,  no organomegaly Extremities: extremities normal, atraumatic, no cyanosis or edema Neurologic: Alert and oriented X 3, normal strength and tone. Normal symmetric reflexes. Normal coordination and gait  ECOG PERFORMANCE STATUS: 1 - Symptomatic but completely ambulatory  Blood pressure 130/60, pulse 74, temperature 98.2 F (36.8 C), temperature source Oral, resp. rate 20, height _0  (1.778 m), weight 166 lb 1.6 oz (75.342 kg), SpO2 100 %.  LABORATORY DATA: Lab Results  Component Value Date   WBC 2.6* 04/23/2015   HGB 7.6* 04/23/2015   HCT 24.3* 04/23/2015   MCV 79.4 04/23/2015   PLT 104* 04/23/2015      Chemistry      Component Value Date/Time   NA 141 04/16/2015 0908   NA 139 08/30/2013 0935   K 4.1 04/16/2015  0908   K 4.1 08/30/2013 0935   CL 101 08/30/2013 0935   CL 103 07/12/2012 0907   CO2 26 04/16/2015 0908   CO2 26 08/30/2013 0935   BUN 22.3 04/16/2015 0908   BUN 11 08/30/2013 0935   CREATININE 2.3* 04/16/2015 0908   CREATININE 1.03 08/30/2013 0935      Component Value Date/Time   CALCIUM 9.8 04/16/2015 0908   CALCIUM 9.2 08/30/2013 0935   ALKPHOS 41 04/16/2015 0908   ALKPHOS 39 08/30/2013 0935   AST 20 04/16/2015 0908   AST 13 08/30/2013 0935   ALT 9 04/16/2015 0908   ALT 9 08/30/2013 0935   BILITOT 1.44* 04/16/2015 0908   BILITOT 1.3* 08/30/2013 0935     Other lab results: Beta-2 microglobulin 2.80, free kappa light chain 0.76, free lambda light chain 101.73 with a kappa/lambda ratio of 0.01. IgG 91, IgA 1059 and IgM 5.  RADIOGRAPHIC STUDIES: No results found.  ASSESSMENT AND PLAN:  This is a very pleasant 70 years old [place] American male with history of multiple myeloma currently undergoing systemic chemotherapy with Carfilzomib, cyclophosphamide and Decadron status post 19 cycles. This was discontinued secondary to disease progression. The patient was started on systemic chemotherapy with Carfilzomib, Pomalyst and Decadron status post 12 cycles. I recommended for the patient to proceed with cycle #12 today as scheduled. Unfortunately he was found to have evidence for disease progression in his treatment was discontinued. He was seen recently at [facility] by Dr. Evelene Croon. She agreed with the current plan of treatment with Daratumumab, Velcade and Decadron. He started the first dose of this treatment on 04/20/2015 at [facility] and he is here today to continue with the first cycle of his treatment. He is tolerating his treatment well except for fatigue. This is most likely secondary to chemotherapy-induced anemia. I recommended for the patient to proceed with his chemotherapy today as a scheduled. For the anemia of neoplastic disease, I will arrange for the patient to receive 2 units of PRBC transfusion. The patient would come back for follow-up visit in 3 weeks for reevaluation and management of any adverse effect of his treatment. He'll continue on Zometa every 3 months. For the venous thrombosis, he will continue on Xarelto 20 mg by mouth daily. He was advised to call immediately if he has any concerning symptoms in the interval.  The patient voices understanding of current disease status and treatment options and is in agreement with the current care plan. All questions were answered. The patient knows to call the clinic with any problems, questions or concerns. We can certainly see the patient much sooner if necessary.  Disclaimer: This note was dictated with voice recognition software. Similar sounding words can inadvertently be transcribed and may not be  corrected upon review.

## 2015-04-23 NOTE — Progress Notes (Addendum)
Labs CBC/CMET reviewed with MD. Per MD pt will get treatment 4/3 and 4/7. Pt will have blood transfusion 2units. 1 unit Friday; 1 unit Saturday. [facility] called in orders entered. POF for lab/Flush appt Friday- T&C to be drawn from port

## 2015-04-23 NOTE — Telephone Encounter (Signed)
Added appt pt ok and aware to pick up sched in chemo

## 2015-04-23 NOTE — Patient Instructions (Signed)
[facility] Discharge Instructions for Patients Receiving Chemotherapy  Today you received the following chemotherapy agents Velcade  To help prevent nausea and vomiting after your treatment, we encourage you to take your nausea medication as directed.   If you develop nausea and vomiting that is not controlled by your nausea medication, call the clinic.   BELOW ARE SYMPTOMS THAT SHOULD BE REPORTED IMMEDIATELY:  *FEVER GREATER THAN 100.5 F  *CHILLS WITH OR WITHOUT FEVER  NAUSEA AND VOMITING THAT IS NOT CONTROLLED WITH YOUR NAUSEA MEDICATION  *UNUSUAL SHORTNESS OF BREATH  *UNUSUAL BRUISING OR BLEEDING  TENDERNESS IN MOUTH AND THROAT WITH OR WITHOUT PRESENCE OF ULCERS  *URINARY PROBLEMS  *BOWEL PROBLEMS  UNUSUAL RASH Items with * indicate a potential emergency and should be followed up as soon as possible.  Feel free to call the clinic you have any questions or concerns. The clinic phone number is [phone].  Please show the CHEMO ALERT CARD at check-in to the Emergency Department and triage nurse.     Bortezomib injection What is this medicine? BORTEZOMIB (bor TEZ oh mib) is a medicine that targets proteins in cancer cells and stops the cancer cells from growing. It is used to treat multiple myeloma and mantle-cell lymphoma. This medicine may be used for other purposes; ask your health care provider or pharmacist if you have questions. What should I tell my health care provider before I take this medicine? They need to know if you have any of these conditions: -diabetes -heart disease -irregular heartbeat -liver disease -on hemodialysis -low blood counts, like low white blood cells, platelets, or hemoglobin -peripheral neuropathy -taking medicine for blood pressure -an unusual or allergic reaction to bortezomib, mannitol, boron, other medicines, foods, dyes, or preservatives -pregnant or trying to get pregnant -breast-feeding How should I use  this medicine? This medicine is for injection into a vein or for injection under the skin. It is given by a health care professional in a hospital or clinic setting. Talk to your pediatrician regarding the use of this medicine in children. Special care may be needed. Overdosage: If you think you have taken too much of this medicine contact a poison control center or emergency room at once. NOTE: This medicine is only for you. Do not share this medicine with others. What if I miss a dose? It is important not to miss your dose. Call your doctor or health care professional if you are unable to keep an appointment. What may interact with this medicine? This medicine may interact with the following medications: -ketoconazole -rifampin -ritonavir -St. John's Wort This list may not describe all possible interactions. Give your health care provider a list of all the medicines, herbs, non-prescription drugs, or dietary supplements you use. Also tell them if you smoke, drink alcohol, or use illegal drugs. Some items may interact with your medicine. What should I watch for while using this medicine? Visit your doctor for checks on your progress. This drug may make you feel generally unwell. This is not uncommon, as chemotherapy can affect healthy cells as well as cancer cells. Report any side effects. Continue your course of treatment even though you feel ill unless your doctor tells you to stop. You may get drowsy or dizzy. Do not drive, use machinery, or do anything that needs mental alertness until you know how this medicine affects you. Do not stand or sit up quickly, especially if you are an older patient. This reduces the risk of dizzy or fainting   spells. In some cases, you may be given additional medicines to help with side effects. Follow all directions for their use. Call your doctor or health care professional for advice if you get a fever, chills or sore throat, or other symptoms of a cold or flu.  Do not treat yourself. This drug decreases your body's ability to fight infections. Try to avoid being around people who are sick. This medicine may increase your risk to bruise or bleed. Call your doctor or health care professional if you notice any unusual bleeding. You may need blood work done while you are taking this medicine. In some patients, this medicine may cause a serious brain infection that may cause death. If you have any problems seeing, thinking, speaking, walking, or standing, tell your doctor right away. If you cannot reach your doctor, urgently seek other source of medical care. Do not become pregnant while taking this medicine. Women should inform their doctor if they wish to become pregnant or think they might be pregnant. There is a potential for serious side effects to an unborn child. Talk to your health care professional or pharmacist for more information. Do not breast-feed an infant while taking this medicine. Check with your doctor or health care professional if you get an attack of severe diarrhea, nausea and vomiting, or if you sweat a lot. The loss of too much body fluid can make it dangerous for you to take this medicine. What side effects may I notice from receiving this medicine? Side effects that you should report to your doctor or health care professional as soon as possible: -allergic reactions like skin rash, itching or hives, swelling of the face, lips, or tongue -breathing problems -changes in hearing -changes in vision -fast, irregular heartbeat -feeling faint or lightheaded, falls -pain, tingling, numbness in the hands or feet -right upper belly pain -seizures -swelling of the ankles, feet, hands -unusual bleeding or bruising -unusually weak or tired -vomiting -yellowing of the eyes or skin Side effects that usually do not require medical attention (report to your doctor or health care professional if they continue or are bothersome): -changes in  emotions or moods -constipation -diarrhea -loss of appetite -headache -irritation at site where injected -nausea This list may not describe all possible side effects. Call your doctor for medical advice about side effects. You may report side effects to FDA at 1-800-FDA-1088. Where should I keep my medicine? This drug is given in a hospital or clinic and will not be stored at home. NOTE: This sheet is a summary. It may not cover all possible information. If you have questions about this medicine, talk to your doctor, pharmacist, or health care provider.    2016, Elsevier/Gold Standard. (2014-03-07 14:47:04)   

## 2015-04-23 NOTE — Progress Notes (Signed)
Okay to treat with labs (hemoglobin 7.6, creatinine 1.7, WBC 2.6) on 04/23/15. Pt scheduled to receive 2 units of blood this week. Pt aware and schedule given to pt, pt verbalizes understanding.  Pt tolerated treatment well today. Pt monitored 30 minutes post treatment. Pt and VS stable at time of discharge.

## 2015-04-25 ENCOUNTER — Ambulatory Visit (AMBULATORY_SURGERY_CENTER): Payer: Self-pay | Admitting: *Deleted

## 2015-04-25 VITALS — Ht 71.0 in | Wt 162.4 lb

## 2015-04-25 DIAGNOSIS — R1319 Other dysphagia: Secondary | ICD-10-CM

## 2015-04-25 NOTE — Progress Notes (Signed)
No allergies to eggs or soy. No problems with anesthesia.  Pt given Emmi instructions for egd  No oxygen use  No diet drug use  

## 2015-04-27 ENCOUNTER — Other Ambulatory Visit: Payer: Self-pay | Admitting: *Deleted

## 2015-04-27 ENCOUNTER — Other Ambulatory Visit: Payer: Medicare Other

## 2015-04-27 ENCOUNTER — Ambulatory Visit: Payer: Medicare Other

## 2015-04-27 ENCOUNTER — Ambulatory Visit (HOSPITAL_BASED_OUTPATIENT_CLINIC_OR_DEPARTMENT_OTHER): Payer: Medicare Other

## 2015-04-27 VITALS — BP 132/70 | HR 75 | Temp 98.4°F | Resp 18

## 2015-04-27 DIAGNOSIS — Z5112 Encounter for antineoplastic immunotherapy: Secondary | ICD-10-CM

## 2015-04-27 DIAGNOSIS — C9002 Multiple myeloma in relapse: Secondary | ICD-10-CM

## 2015-04-27 DIAGNOSIS — D649 Anemia, unspecified: Secondary | ICD-10-CM | POA: Diagnosis present

## 2015-04-27 DIAGNOSIS — C9000 Multiple myeloma not having achieved remission: Secondary | ICD-10-CM

## 2015-04-27 DIAGNOSIS — Z95828 Presence of other vascular implants and grafts: Secondary | ICD-10-CM

## 2015-04-27 LAB — CBC WITH DIFFERENTIAL/PLATELET
BASO%: 0 % (ref 0.0–2.0)
BASOS ABS: 0 10*3/uL (ref 0.0–0.1)
EOS%: 0.3 % (ref 0.0–7.0)
Eosinophils Absolute: 0 10*3/uL (ref 0.0–0.5)
HCT: 24.6 % — ABNORMAL LOW (ref 38.4–49.9)
HEMOGLOBIN: 7.8 g/dL — AB (ref 13.0–17.1)
LYMPH#: 0.4 10*3/uL — AB (ref 0.9–3.3)
LYMPH%: 10 % — ABNORMAL LOW (ref 14.0–49.0)
MCH: 25 pg — ABNORMAL LOW (ref 27.2–33.4)
MCHC: 31.7 g/dL — ABNORMAL LOW (ref 32.0–36.0)
MCV: 78.8 fL — AB (ref 79.3–98.0)
MONO#: 0.4 10*3/uL (ref 0.1–0.9)
MONO%: 9.7 % (ref 0.0–14.0)
NEUT%: 80 % — ABNORMAL HIGH (ref 39.0–75.0)
NEUTROS ABS: 3 10*3/uL (ref 1.5–6.5)
NRBC: 0 % (ref 0–0)
Platelets: 92 10*3/uL — ABNORMAL LOW (ref 140–400)
RBC: 3.12 10*6/uL — AB (ref 4.20–5.82)
RDW: 15.4 % — AB (ref 11.0–14.6)
WBC: 3.7 10*3/uL — AB (ref 4.0–10.3)

## 2015-04-27 LAB — COMPREHENSIVE METABOLIC PANEL
ALBUMIN: 3.4 g/dL — AB (ref 3.5–5.0)
ALK PHOS: 47 U/L (ref 40–150)
ALT: 9 U/L (ref 0–55)
AST: 14 U/L (ref 5–34)
Anion Gap: 13 mEq/L — ABNORMAL HIGH (ref 3–11)
BILIRUBIN TOTAL: 1.04 mg/dL (ref 0.20–1.20)
BUN: 36.8 mg/dL — AB (ref 7.0–26.0)
CO2: 27 meq/L (ref 22–29)
Calcium: 9.8 mg/dL (ref 8.4–10.4)
Chloride: 99 mEq/L (ref 98–109)
Creatinine: 1.8 mg/dL — ABNORMAL HIGH (ref 0.7–1.3)
EGFR: 44 mL/min/{1.73_m2} — ABNORMAL LOW (ref >90)
Glucose: 113 mg/dl (ref 70–140)
Potassium: 3.5 mEq/L (ref 3.5–5.1)
Sodium: 139 mEq/L (ref 136–145)
TOTAL PROTEIN: 7.7 g/dL (ref 6.4–8.3)

## 2015-04-27 MED ORDER — PROCHLORPERAZINE MALEATE 10 MG PO TABS: 10.0000 mg | ORAL_TABLET | Freq: Once | ORAL | Status: DC

## 2015-04-27 MED ORDER — ACETAMINOPHEN 325 MG PO TABS
ORAL_TABLET | ORAL | Status: AC
  Filled 2015-04-27: qty 2

## 2015-04-27 MED ORDER — DIPHENHYDRAMINE HCL 25 MG PO CAPS
25.0000 mg | ORAL_CAPSULE | Freq: Once | ORAL | Status: AC
  Administered 2015-04-27: 25 mg via ORAL

## 2015-04-27 MED ORDER — METHYLPREDNISOLONE SODIUM SUCC 125 MG IJ SOLR
125.0000 mg | Freq: Once | INTRAMUSCULAR | Status: AC
  Administered 2015-04-27: 125 mg via INTRAVENOUS

## 2015-04-27 MED ORDER — SODIUM CHLORIDE 0.9% FLUSH
10.0000 mL | INTRAVENOUS | Status: DC | PRN
  Administered 2015-04-27: 10 mL
  Filled 2015-04-27: qty 10

## 2015-04-27 MED ORDER — ACETAMINOPHEN 325 MG PO TABS
650.0000 mg | ORAL_TABLET | Freq: Once | ORAL | Status: AC
  Administered 2015-04-27: 650 mg via ORAL

## 2015-04-27 MED ORDER — SODIUM CHLORIDE 0.9 % IV SOLN
16.0000 mg/kg | Freq: Once | INTRAVENOUS | Status: AC
  Administered 2015-04-27: 1200 mg via INTRAVENOUS
  Filled 2015-04-27: qty 60

## 2015-04-27 MED ORDER — DIPHENHYDRAMINE HCL 25 MG PO CAPS
ORAL_CAPSULE | ORAL | Status: AC
  Filled 2015-04-27: qty 1

## 2015-04-27 MED ORDER — PROCHLORPERAZINE MALEATE 10 MG PO TABS
ORAL_TABLET | ORAL | Status: AC
  Filled 2015-04-27: qty 1

## 2015-04-27 MED ORDER — PROCHLORPERAZINE MALEATE 10 MG PO TABS
10.0000 mg | ORAL_TABLET | Freq: Once | ORAL | Status: AC
  Administered 2015-04-27: 10 mg via ORAL

## 2015-04-27 MED ORDER — METHYLPREDNISOLONE SODIUM SUCC 125 MG IJ SOLR
INTRAMUSCULAR | Status: AC
  Filled 2015-04-27: qty 2

## 2015-04-27 MED ORDER — SODIUM CHLORIDE 0.9% FLUSH
10.0000 mL | INTRAVENOUS | Status: DC | PRN
  Administered 2015-04-27: 10 mL via INTRAVENOUS
  Filled 2015-04-27: qty 10

## 2015-04-27 MED ORDER — BORTEZOMIB CHEMO SQ INJECTION 3.5 MG (2.5MG/ML)
1.3000 mg/m2 | Freq: Once | INTRAMUSCULAR | Status: AC
  Administered 2015-04-27: 2.5 mg via SUBCUTANEOUS
  Filled 2015-04-27: qty 2.5

## 2015-04-27 MED ORDER — HEPARIN SOD (PORK) LOCK FLUSH 100 UNIT/ML IV SOLN
500.0000 [IU] | Freq: Once | INTRAVENOUS | Status: DC | PRN
  Filled 2015-04-27: qty 5

## 2015-04-27 MED ORDER — SODIUM CHLORIDE 0.9 % IV SOLN
Freq: Once | INTRAVENOUS | Status: AC
  Administered 2015-04-27: 09:00:00 via INTRAVENOUS

## 2015-04-27 MED ORDER — DIPHENHYDRAMINE HCL 25 MG PO CAPS
50.0000 mg | ORAL_CAPSULE | Freq: Once | ORAL | Status: AC
  Administered 2015-04-27: 50 mg via ORAL

## 2015-04-27 MED ORDER — DIPHENHYDRAMINE HCL 25 MG PO CAPS
ORAL_CAPSULE | ORAL | Status: AC
  Filled 2015-04-27: qty 2

## 2015-04-27 NOTE — Patient Instructions (Signed)

## 2015-04-27 NOTE — Progress Notes (Signed)
Okay to treat per Dr. Julien Nordmann despite Hgb 7.8 and plts 92.  Patient to receive 1 unit PRBC today and 1 unit PRBC tomorrow.  Also, okay to proceed today using 04/23/15 CMet per Dr. Julien Nordmann.

## 2015-04-27 NOTE — Patient Instructions (Signed)
Daratumumab injection What is this medicine? DARATUMUMAB (dar a toom ue mab) is a monoclonal antibody. It is used to treat multiple myeloma. This medicine may be used for other purposes; ask your health care provider or pharmacist if you have questions. What should I tell my health care provider before I take this medicine? They need to know if you have any of these conditions: -infection (especially a virus infection such as chickenpox, cold sores, or herpes) -lung or breathing disease -pregnant or trying to get pregnant -breast-feeding -an unusual or allergic reaction to daratumumab, other medicines, foods, dyes, or preservatives How should I use this medicine? This medicine is for infusion into a vein. It is given by a health care professional in a hospital or clinic setting. Talk to your pediatrician regarding the use of this medicine in children. Special care may be needed. Overdosage: If you think you have taken too much of this medicine contact a poison control center or emergency room at once. NOTE: This medicine is only for you. Do not share this medicine with others. What if I miss a dose? Keep appointments for follow-up doses as directed. It is important not to miss your dose. Call your doctor or health care professional if you are unable to keep an appointment. What may interact with this medicine? Interactions have not been studied. Give your health care provider a list of all the medicines, herbs, non-prescription drugs, or dietary supplements you use. Also tell them if you smoke, drink alcohol, or use illegal drugs. Some items may interact with your medicine. This list may not describe all possible interactions. Give your health care provider a list of all the medicines, herbs, non-prescription drugs, or dietary supplements you use. Also tell them if you smoke, drink alcohol, or use illegal drugs. Some items may interact with your medicine. What should I watch for while using  this medicine? This drug may make you feel generally unwell. Report any side effects. Continue your course of treatment even though you feel ill unless your doctor tells you to stop. This medicine can cause serious allergic reactions. To reduce your risk you may need to take medicine before treatment with this medicine. Take your medicine as directed. This medicine can affect the results of blood tests to match your blood type. These changes can last for up to 6 months after the final dose. Your healthcare provider will do blood tests to match your blood type before you start treatment. Tell all of your healthcare providers that you are being treated with this medicine before receiving a blood transfusion. This medicine can affect the results of some tests used to determine treatment response; extra tests may be needed to evaluate response. Do not become pregnant while taking this medicine or for 3 months after stopping it. Women should inform their doctor if they wish to become pregnant or think they might be pregnant. There is a potential for serious side effects to an unborn child. Talk to your health care professional or pharmacist for more information. What side effects may I notice from receiving this medicine? Side effects that you should report to your doctor or health care professional as soon as possible: -allergic reactions like skin rash, itching or hives, swelling of the face, lips, or tongue -breathing problems -chills -cough -dizziness -feeling faint or lightheaded -headache -nausea, vomiting -shortness of breath Side effects that usually do not require medical attention (Report these to your doctor or health care professional if they continue or are bothersome.): -  back pain -fever -joint pain -loss of appetite -tiredness This list may not describe all possible side effects. Call your doctor for medical advice about side effects. You may report side effects to FDA at  1-800-FDA-1088. Where should I keep my medicine? Keep out of the reach of children. This drug is given in a hospital or clinic and will not be stored at home. NOTE: This sheet is a summary. It may not cover all possible information. If you have questions about this medicine, talk to your doctor, pharmacist, or health care provider.    2016, Elsevier/Gold Standard. (2014-03-07 17:02:23)  Bortezomib injection What is this medicine? BORTEZOMIB (bor TEZ oh mib) is a medicine that targets proteins in cancer cells and stops the cancer cells from growing. It is used to treat multiple myeloma and mantle-cell lymphoma. This medicine may be used for other purposes; ask your health care provider or pharmacist if you have questions. What should I tell my health care provider before I take this medicine? They need to know if you have any of these conditions: -diabetes -heart disease -irregular heartbeat -liver disease -on hemodialysis -low blood counts, like low white blood cells, platelets, or hemoglobin -peripheral neuropathy -taking medicine for blood pressure -an unusual or allergic reaction to bortezomib, mannitol, boron, other medicines, foods, dyes, or preservatives -pregnant or trying to get pregnant -breast-feeding How should I use this medicine? This medicine is for injection into a vein or for injection under the skin. It is given by a health care professional in a hospital or clinic setting. Talk to your pediatrician regarding the use of this medicine in children. Special care may be needed. Overdosage: If you think you have taken too much of this medicine contact a poison control center or emergency room at once. NOTE: This medicine is only for you. Do not share this medicine with others. What if I miss a dose? It is important not to miss your dose. Call your doctor or health care professional if you are unable to keep an appointment. What may interact with this medicine? This  medicine may interact with the following medications: -ketoconazole -rifampin -ritonavir -St. John's Wort This list may not describe all possible interactions. Give your health care provider a list of all the medicines, herbs, non-prescription drugs, or dietary supplements you use. Also tell them if you smoke, drink alcohol, or use illegal drugs. Some items may interact with your medicine. What should I watch for while using this medicine? Visit your doctor for checks on your progress. This drug may make you feel generally unwell. This is not uncommon, as chemotherapy can affect healthy cells as well as cancer cells. Report any side effects. Continue your course of treatment even though you feel ill unless your doctor tells you to stop. You may get drowsy or dizzy. Do not drive, use machinery, or do anything that needs mental alertness until you know how this medicine affects you. Do not stand or sit up quickly, especially if you are an older patient. This reduces the risk of dizzy or fainting spells. In some cases, you may be given additional medicines to help with side effects. Follow all directions for their use. Call your doctor or health care professional for advice if you get a fever, chills or sore throat, or other symptoms of a cold or flu. Do not treat yourself. This drug decreases your body's ability to fight infections. Try to avoid being around people who are sick. This medicine may increase your risk  to bruise or bleed. Call your doctor or health care professional if you notice any unusual bleeding. You may need blood work done while you are taking this medicine. In some patients, this medicine may cause a serious brain infection that may cause death. If you have any problems seeing, thinking, speaking, walking, or standing, tell your doctor right away. If you cannot reach your doctor, urgently seek other source of medical care. Do not become pregnant while taking this medicine. Women  should inform their doctor if they wish to become pregnant or think they might be pregnant. There is a potential for serious side effects to an unborn child. Talk to your health care professional or pharmacist for more information. Do not breast-feed an infant while taking this medicine. Check with your doctor or health care professional if you get an attack of severe diarrhea, nausea and vomiting, or if you sweat a lot. The loss of too much body fluid can make it dangerous for you to take this medicine. What side effects may I notice from receiving this medicine? Side effects that you should report to your doctor or health care professional as soon as possible: -allergic reactions like skin rash, itching or hives, swelling of the face, lips, or tongue -breathing problems -changes in hearing -changes in vision -fast, irregular heartbeat -feeling faint or lightheaded, falls -pain, tingling, numbness in the hands or feet -right upper belly pain -seizures -swelling of the ankles, feet, hands -unusual bleeding or bruising -unusually weak or tired -vomiting -yellowing of the eyes or skin Side effects that usually do not require medical attention (report to your doctor or health care professional if they continue or are bothersome): -changes in emotions or moods -constipation -diarrhea -loss of appetite -headache -irritation at site where injected -nausea This list may not describe all possible side effects. Call your doctor for medical advice about side effects. You may report side effects to FDA at 1-800-FDA-1088. Where should I keep my medicine? This drug is given in a hospital or clinic and will not be stored at home. NOTE: This sheet is a summary. It may not cover all possible information. If you have questions about this medicine, talk to your doctor, pharmacist, or health care provider.    2016, Elsevier/Gold Standard. (2014-03-07 14:47:04)  Blood Transfusion  A blood transfusion  is a procedure in which you receive donated blood through an IV tube. You may need a blood transfusion because of illness, surgery, or injury. The blood may come from a donor, or it may be your own blood that you donated previously. The blood given in a transfusion is made up of different types of cells. You may receive: 1. Red blood cells. These carry oxygen and replace lost blood. 2. Platelets. These control bleeding. 3. Plasma. Thishelps blood to clot. If you have hemophilia or another clotting disorder, you may also receive other types of blood products. LET [facility] CARE PROVIDER KNOW ABOUT: 1. Any allergies you have. 2. All medicines you are taking, including vitamins, herbs, eye drops, creams, and over-the-counter medicines. 3. Previous problems you or members of your family have had with the use of anesthetics. 4. Any blood disorders you have. 5. Previous surgeries you have had. 6. Any medical conditions you may have. 7. Any previous reactions you have had during a blood transfusion.  RISKS AND COMPLICATIONS Generally, this is a safe procedure. However, problems may occur, including:  Having an allergic reaction to something in the donated blood.  Fever. This  may be a reaction to the white blood cells in the transfused blood.  Iron overload. This can happen from having many transfusions.  Transfusion-related acute lung injury (TRALI). This is a rare reaction that causes lung damage. The cause is not known.TRALI can occur within hours of a transfusion or several days later.  Sudden (acute) or delayed hemolytic reactions. This happens if your blood does not match the cells in your transfusion. Your body's defense system (immune system) may try to attack the new cells. This complication is rare.  Infection. This is rare. BEFORE THE PROCEDURE  You may have a blood test to determine your blood type. This is necessary to know what kind of blood your body will accept.  If you  are going to have a planned surgery, you may donate your own blood. This may be done in case you need to have a transfusion.  If you have had an allergic reaction to a transfusion in the past, you may be given medicine to help prevent a reaction. Take this medicine only as directed by your health care provider.  You will have your temperature, blood pressure, and pulse monitored before the transfusion. PROCEDURE   An IV will be started in your hand or arm.  The bag of donated blood will be attached to your IV tube and given into your vein.  Your temperature, blood pressure, and pulse will be monitored regularly during the transfusion. This monitoring is done to detect early signs of a transfusion reaction.  If you have any signs or symptoms of a reaction, your transfusion will be stopped and you may be given medicine.  When the transfusion is over, your IV will be removed.  Pressure may be applied to the IV site for a few minutes.  A bandage (dressing) will be applied. The procedure may vary among health care providers and hospitals. AFTER THE PROCEDURE  Your blood pressure, temperature, and pulse will be monitored regularly.   This information is not intended to replace advice given to you by your health care provider. Make sure you discuss any questions you have with your health care provider.   Document Released: 01/04/2000 Document Revised: 01/27/2014 Document Reviewed: 11/16/2013 Elsevier Interactive Patient Education 2016 [place] Discharge Instructions for Patients Receiving Chemotherapy  Today you received the following chemotherapy agents Velcade/Darzalex.  To help prevent nausea and vomiting after your treatment, we encourage you to take your nausea medication as directed.   If you develop nausea and vomiting that is not controlled by your nausea medication, call the clinic.   BELOW ARE SYMPTOMS THAT SHOULD BE REPORTED  IMMEDIATELY:  *FEVER GREATER THAN 100.5 F  *CHILLS WITH OR WITHOUT FEVER  NAUSEA AND VOMITING THAT IS NOT CONTROLLED WITH YOUR NAUSEA MEDICATION  *UNUSUAL SHORTNESS OF BREATH  *UNUSUAL BRUISING OR BLEEDING  TENDERNESS IN MOUTH AND THROAT WITH OR WITHOUT PRESENCE OF ULCERS  *URINARY PROBLEMS  *BOWEL PROBLEMS  UNUSUAL RASH Items with * indicate a potential emergency and should be followed up as soon as possible.  Feel free to call the clinic you have any questions or concerns. The clinic phone number is [phone].  Please show the [place] at check-in to the Emergency Department and triage nurse.

## 2015-04-28 ENCOUNTER — Ambulatory Visit: Payer: Medicare Other

## 2015-04-28 VITALS — BP 133/76 | HR 75 | Temp 98.2°F | Resp 18

## 2015-04-28 DIAGNOSIS — C9000 Multiple myeloma not having achieved remission: Secondary | ICD-10-CM | POA: Diagnosis not present

## 2015-04-28 LAB — PREPARE RBC (CROSSMATCH)

## 2015-04-28 MED ORDER — SODIUM CHLORIDE 0.9% FLUSH
10.0000 mL | INTRAVENOUS | Status: AC | PRN
  Administered 2015-04-28: 10 mL
  Filled 2015-04-28: qty 10

## 2015-04-28 MED ORDER — DIPHENHYDRAMINE HCL 25 MG PO CAPS
ORAL_CAPSULE | ORAL | Status: AC
  Filled 2015-04-28: qty 1

## 2015-04-28 MED ORDER — ACETAMINOPHEN 325 MG PO TABS
ORAL_TABLET | ORAL | Status: AC
  Filled 2015-04-28: qty 2

## 2015-04-28 MED ORDER — ACETAMINOPHEN 325 MG PO TABS
650.0000 mg | ORAL_TABLET | Freq: Once | ORAL | Status: AC
  Administered 2015-04-28: 650 mg via ORAL

## 2015-04-28 MED ORDER — DIPHENHYDRAMINE HCL 25 MG PO CAPS
25.0000 mg | ORAL_CAPSULE | Freq: Once | ORAL | Status: AC
Start: 2015-04-28 — End: 2015-04-28
  Administered 2015-04-28: 25 mg via ORAL

## 2015-04-28 MED ORDER — HEPARIN SOD (PORK) LOCK FLUSH 100 UNIT/ML IV SOLN
500.0000 [IU] | Freq: Every day | INTRAVENOUS | Status: AC | PRN
  Administered 2015-04-28: 500 [IU]
  Filled 2015-04-28: qty 5

## 2015-04-28 MED ORDER — SODIUM CHLORIDE 0.9 % IV SOLN
250.0000 mL | Freq: Once | INTRAVENOUS | Status: AC
  Administered 2015-04-28: 250 mL via INTRAVENOUS

## 2015-04-28 NOTE — Progress Notes (Signed)
Ok to transfuse with least incompatible match per Dr Benay Spice

## 2015-04-30 ENCOUNTER — Other Ambulatory Visit (HOSPITAL_BASED_OUTPATIENT_CLINIC_OR_DEPARTMENT_OTHER): Payer: Medicare Other

## 2015-04-30 ENCOUNTER — Ambulatory Visit (HOSPITAL_BASED_OUTPATIENT_CLINIC_OR_DEPARTMENT_OTHER): Payer: Medicare Other

## 2015-04-30 VITALS — BP 134/72 | HR 47 | Temp 98.0°F | Resp 18

## 2015-04-30 DIAGNOSIS — C9000 Multiple myeloma not having achieved remission: Secondary | ICD-10-CM

## 2015-04-30 DIAGNOSIS — C9002 Multiple myeloma in relapse: Secondary | ICD-10-CM

## 2015-04-30 DIAGNOSIS — Z5112 Encounter for antineoplastic immunotherapy: Secondary | ICD-10-CM

## 2015-04-30 LAB — COMPREHENSIVE METABOLIC PANEL
ALBUMIN: 3.4 g/dL — AB (ref 3.5–5.0)
ALK PHOS: 46 U/L (ref 40–150)
ALT: 9 U/L (ref 0–55)
AST: 14 U/L (ref 5–34)
Anion Gap: 12 mEq/L — ABNORMAL HIGH (ref 3–11)
BILIRUBIN TOTAL: 1.49 mg/dL — AB (ref 0.20–1.20)
BUN: 31.1 mg/dL — AB (ref 7.0–26.0)
CALCIUM: 10 mg/dL (ref 8.4–10.4)
CO2: 27 mEq/L (ref 22–29)
CREATININE: 1.4 mg/dL — AB (ref 0.7–1.3)
Chloride: 99 mEq/L (ref 98–109)
EGFR: 60 mL/min/{1.73_m2} — ABNORMAL LOW (ref >90)
Glucose: 101 mg/dl (ref 70–140)
POTASSIUM: 4.1 meq/L (ref 3.5–5.1)
Sodium: 138 mEq/L (ref 136–145)
TOTAL PROTEIN: 7.8 g/dL (ref 6.4–8.3)

## 2015-04-30 LAB — TYPE AND SCREEN
ABO/RH(D): AB POS
ANTIBODY SCREEN: POSITIVE
DAT, IGG: NEGATIVE
UNIT DIVISION: 0
Unit division: 0

## 2015-04-30 LAB — CBC WITH DIFFERENTIAL/PLATELET
BASO%: 0 % (ref 0.0–2.0)
BASOS ABS: 0 10*3/uL (ref 0.0–0.1)
EOS%: 0 % (ref 0.0–7.0)
Eosinophils Absolute: 0 10*3/uL (ref 0.0–0.5)
HEMATOCRIT: 31.7 % — AB (ref 38.4–49.9)
HEMOGLOBIN: 10.3 g/dL — AB (ref 13.0–17.1)
LYMPH#: 0.6 10*3/uL — AB (ref 0.9–3.3)
LYMPH%: 11.6 % — ABNORMAL LOW (ref 14.0–49.0)
MCH: 26.1 pg — ABNORMAL LOW (ref 27.2–33.4)
MCHC: 32.5 g/dL (ref 32.0–36.0)
MCV: 80.5 fL (ref 79.3–98.0)
MONO#: 0.1 10*3/uL (ref 0.1–0.9)
MONO%: 2.7 % (ref 0.0–14.0)
NEUT#: 4.2 10*3/uL (ref 1.5–6.5)
NEUT%: 85.7 % — AB (ref 39.0–75.0)
NRBC: 1 % — AB (ref 0–0)
Platelets: 83 10*3/uL — ABNORMAL LOW (ref 140–400)
RBC: 3.94 10*6/uL — ABNORMAL LOW (ref 4.20–5.82)
RDW: 15 % — ABNORMAL HIGH (ref 11.0–14.6)
WBC: 4.8 10*3/uL (ref 4.0–10.3)

## 2015-04-30 MED ORDER — PROCHLORPERAZINE MALEATE 10 MG PO TABS
10.0000 mg | ORAL_TABLET | Freq: Once | ORAL | Status: AC
  Administered 2015-04-30: 10 mg via ORAL

## 2015-04-30 MED ORDER — PROCHLORPERAZINE MALEATE 10 MG PO TABS
ORAL_TABLET | ORAL | Status: AC
  Filled 2015-04-30: qty 1

## 2015-04-30 MED ORDER — BORTEZOMIB CHEMO SQ INJECTION 3.5 MG (2.5MG/ML)
1.3000 mg/m2 | Freq: Once | INTRAMUSCULAR | Status: AC
  Administered 2015-04-30: 2.5 mg via SUBCUTANEOUS
  Filled 2015-04-30: qty 2.5

## 2015-04-30 NOTE — Patient Instructions (Addendum)
[place] Discharge Instructions for Patients Receiving Chemotherapy  Today you received the following chemotherapy agents Velcade.  To help prevent nausea and vomiting after your treatment, we encourage you to take your nausea medication as directed.    If you develop nausea and vomiting that is not controlled by your nausea medication, call the clinic.   BELOW ARE SYMPTOMS THAT SHOULD BE REPORTED IMMEDIATELY:  *FEVER GREATER THAN 100.5 F  *CHILLS WITH OR WITHOUT FEVER  NAUSEA AND VOMITING THAT IS NOT CONTROLLED WITH YOUR NAUSEA MEDICATION  *UNUSUAL SHORTNESS OF BREATH  *UNUSUAL BRUISING OR BLEEDING  TENDERNESS IN MOUTH AND THROAT WITH OR WITHOUT PRESENCE OF ULCERS  *URINARY PROBLEMS  *BOWEL PROBLEMS  UNUSUAL RASH Items with * indicate a potential emergency and should be followed up as soon as possible.  Feel free to call the clinic you have any questions or concerns. The clinic phone number is [phone].  Please show the [place] at check-in to the Emergency Department and triage nurse.  t

## 2015-04-30 NOTE — Progress Notes (Signed)
Ok to treat with platelets 83 and CMET from 04/27/15 per MD Mohamed.

## 2015-05-01 ENCOUNTER — Ambulatory Visit: Payer: Medicare Other

## 2015-05-01 ENCOUNTER — Other Ambulatory Visit: Payer: Self-pay | Admitting: Internal Medicine

## 2015-05-04 ENCOUNTER — Ambulatory Visit (HOSPITAL_BASED_OUTPATIENT_CLINIC_OR_DEPARTMENT_OTHER): Payer: Medicare Other

## 2015-05-04 ENCOUNTER — Other Ambulatory Visit: Payer: Self-pay | Admitting: Internal Medicine

## 2015-05-04 VITALS — BP 140/76 | HR 69 | Temp 98.8°F | Resp 20

## 2015-05-04 DIAGNOSIS — Z5112 Encounter for antineoplastic immunotherapy: Secondary | ICD-10-CM | POA: Diagnosis present

## 2015-05-04 DIAGNOSIS — C9000 Multiple myeloma not having achieved remission: Secondary | ICD-10-CM

## 2015-05-04 MED ORDER — METHYLPREDNISOLONE SODIUM SUCC 125 MG IJ SOLR
125.0000 mg | Freq: Once | INTRAMUSCULAR | Status: AC
  Administered 2015-05-04: 125 mg via INTRAVENOUS

## 2015-05-04 MED ORDER — PROCHLORPERAZINE MALEATE 10 MG PO TABS
ORAL_TABLET | ORAL | Status: AC
  Filled 2015-05-04: qty 1

## 2015-05-04 MED ORDER — ACETAMINOPHEN 325 MG PO TABS
650.0000 mg | ORAL_TABLET | Freq: Once | ORAL | Status: AC
  Administered 2015-05-04: 650 mg via ORAL

## 2015-05-04 MED ORDER — SODIUM CHLORIDE 0.9 % IV SOLN
16.0000 mg/kg | Freq: Once | INTRAVENOUS | Status: AC
  Administered 2015-05-04: 1200 mg via INTRAVENOUS
  Filled 2015-05-04: qty 60

## 2015-05-04 MED ORDER — SODIUM CHLORIDE 0.9 % IV SOLN
Freq: Once | INTRAVENOUS | Status: AC
  Administered 2015-05-04: 08:00:00 via INTRAVENOUS

## 2015-05-04 MED ORDER — HEPARIN SOD (PORK) LOCK FLUSH 100 UNIT/ML IV SOLN
500.0000 [IU] | Freq: Once | INTRAVENOUS | Status: AC | PRN
  Administered 2015-05-04: 500 [IU]
  Filled 2015-05-04: qty 5

## 2015-05-04 MED ORDER — DIPHENHYDRAMINE HCL 25 MG PO CAPS
ORAL_CAPSULE | ORAL | Status: AC
  Filled 2015-05-04: qty 2

## 2015-05-04 MED ORDER — ACETAMINOPHEN 325 MG PO TABS
ORAL_TABLET | ORAL | Status: AC
  Filled 2015-05-04: qty 2

## 2015-05-04 MED ORDER — PROCHLORPERAZINE MALEATE 10 MG PO TABS
10.0000 mg | ORAL_TABLET | Freq: Once | ORAL | Status: AC
  Administered 2015-05-04: 10 mg via ORAL

## 2015-05-04 MED ORDER — DIPHENHYDRAMINE HCL 25 MG PO CAPS
50.0000 mg | ORAL_CAPSULE | Freq: Once | ORAL | Status: AC
  Administered 2015-05-04: 50 mg via ORAL

## 2015-05-04 MED ORDER — METHYLPREDNISOLONE SODIUM SUCC 125 MG IJ SOLR
INTRAMUSCULAR | Status: AC
  Filled 2015-05-04: qty 2

## 2015-05-04 MED ORDER — SODIUM CHLORIDE 0.9% FLUSH
10.0000 mL | INTRAVENOUS | Status: DC | PRN
  Administered 2015-05-04: 10 mL
  Filled 2015-05-04: qty 10

## 2015-05-04 NOTE — Patient Instructions (Signed)
Daratumumab injection What is this medicine? DARATUMUMAB (dar a toom ue mab) is a monoclonal antibody. It is used to treat multiple myeloma. This medicine may be used for other purposes; ask your health care provider or pharmacist if you have questions. What should I tell my health care provider before I take this medicine? They need to know if you have any of these conditions: -infection (especially a virus infection such as chickenpox, cold sores, or herpes) -lung or breathing disease -pregnant or trying to get pregnant -breast-feeding -an unusual or allergic reaction to daratumumab, other medicines, foods, dyes, or preservatives How should I use this medicine? This medicine is for infusion into a vein. It is given by a health care professional in a hospital or clinic setting. Talk to your pediatrician regarding the use of this medicine in children. Special care may be needed. Overdosage: If you think you have taken too much of this medicine contact a poison control center or emergency room at once. NOTE: This medicine is only for you. Do not share this medicine with others. What if I miss a dose? Keep appointments for follow-up doses as directed. It is important not to miss your dose. Call your doctor or health care professional if you are unable to keep an appointment. What may interact with this medicine? Interactions have not been studied. Give your health care provider a list of all the medicines, herbs, non-prescription drugs, or dietary supplements you use. Also tell them if you smoke, drink alcohol, or use illegal drugs. Some items may interact with your medicine. This list may not describe all possible interactions. Give your health care provider a list of all the medicines, herbs, non-prescription drugs, or dietary supplements you use. Also tell them if you smoke, drink alcohol, or use illegal drugs. Some items may interact with your medicine. What should I watch for while using  this medicine? This drug may make you feel generally unwell. Report any side effects. Continue your course of treatment even though you feel ill unless your doctor tells you to stop. This medicine can cause serious allergic reactions. To reduce your risk you may need to take medicine before treatment with this medicine. Take your medicine as directed. This medicine can affect the results of blood tests to match your blood type. These changes can last for up to 6 months after the final dose. Your healthcare provider will do blood tests to match your blood type before you start treatment. Tell all of your healthcare providers that you are being treated with this medicine before receiving a blood transfusion. This medicine can affect the results of some tests used to determine treatment response; extra tests may be needed to evaluate response. Do not become pregnant while taking this medicine or for 3 months after stopping it. Women should inform their doctor if they wish to become pregnant or think they might be pregnant. There is a potential for serious side effects to an unborn child. Talk to your health care professional or pharmacist for more information. What side effects may I notice from receiving this medicine? Side effects that you should report to your doctor or health care professional as soon as possible: -allergic reactions like skin rash, itching or hives, swelling of the face, lips, or tongue -breathing problems -chills -cough -dizziness -feeling faint or lightheaded -headache -nausea, vomiting -shortness of breath Side effects that usually do not require medical attention (Report these to your doctor or health care professional if they continue or are bothersome.): -  back pain -fever -joint pain -loss of appetite -tiredness This list may not describe all possible side effects. Call your doctor for medical advice about side effects. You may report side effects to FDA at  1-800-FDA-1088. Where should I keep my medicine? Keep out of the reach of children. This drug is given in a hospital or clinic and will not be stored at home. NOTE: This sheet is a summary. It may not cover all possible information. If you have questions about this medicine, talk to your doctor, pharmacist, or health care provider.    2016, Elsevier/Gold Standard. (2014-03-07 17:02:23)  Bortezomib injection What is this medicine? BORTEZOMIB (bor TEZ oh mib) is a medicine that targets proteins in cancer cells and stops the cancer cells from growing. It is used to treat multiple myeloma and mantle-cell lymphoma. This medicine may be used for other purposes; ask your health care provider or pharmacist if you have questions. What should I tell my health care provider before I take this medicine? They need to know if you have any of these conditions: -diabetes -heart disease -irregular heartbeat -liver disease -on hemodialysis -low blood counts, like low white blood cells, platelets, or hemoglobin -peripheral neuropathy -taking medicine for blood pressure -an unusual or allergic reaction to bortezomib, mannitol, boron, other medicines, foods, dyes, or preservatives -pregnant or trying to get pregnant -breast-feeding How should I use this medicine? This medicine is for injection into a vein or for injection under the skin. It is given by a health care professional in a hospital or clinic setting. Talk to your pediatrician regarding the use of this medicine in children. Special care may be needed. Overdosage: If you think you have taken too much of this medicine contact a poison control center or emergency room at once. NOTE: This medicine is only for you. Do not share this medicine with others. What if I miss a dose? It is important not to miss your dose. Call your doctor or health care professional if you are unable to keep an appointment. What may interact with this medicine? This  medicine may interact with the following medications: -ketoconazole -rifampin -ritonavir -St. Jonnelle La[place]ak's Wort This list may not describe all possible interactions. Give your health care provider a list of all the medicines, herbs, non-prescription drugs, or dietary supplements you use. Also tell them if you smoke, drink alcohol, or use illegal drugs. Some items may interact with your medicine. What should I watch for while using this medicine? Visit your doctor for checks on your progress. This drug may make you feel generally unwell. This is not uncommon, as chemotherapy can affect healthy cells as well as cancer cells. Report any side effects. Continue your course of treatment even though you feel ill unless your doctor tells you to stop. You may get drowsy or dizzy. Do not drive, use machinery, or do anything that needs mental alertness until you know how this medicine affects you. Do not stand or sit up quickly, especially if you are an older patient. This reduces the risk of dizzy or fainting spells. In some cases, you may be given additional medicines to help with side effects. Follow all directions for their use. Call your doctor or health care professional for advice if you get a fever, chills or sore throat, or other symptoms of a cold or flu. Do not treat yourself. This drug decreases your body's ability to fight infections. Try to avoid being around people who are sick. This medicine may increase your risk  to bruise or bleed. Call your doctor or health care professional if you notice any unusual bleeding. You may need blood work done while you are taking this medicine. In some patients, this medicine may cause a serious brain infection that may cause death. If you have any problems seeing, thinking, speaking, walking, or standing, tell your doctor right away. If you cannot reach your doctor, urgently seek other source of medical care. Do not become pregnant while taking this medicine. Women  should inform their doctor if they wish to become pregnant or think they might be pregnant. There is a potential for serious side effects to an unborn child. Talk to your health care professional or pharmacist for more information. Do not breast-feed an infant while taking this medicine. Check with your doctor or health care professional if you get an attack of severe diarrhea, nausea and vomiting, or if you sweat a lot. The loss of too much body fluid can make it dangerous for you to take this medicine. What side effects may I notice from receiving this medicine? Side effects that you should report to your doctor or health care professional as soon as possible: -allergic reactions like skin rash, itching or hives, swelling of the face, lips, or tongue -breathing problems -changes in hearing -changes in vision -fast, irregular heartbeat -feeling faint or lightheaded, falls -pain, tingling, numbness in the hands or feet -right upper belly pain -seizures -swelling of the ankles, feet, hands -unusual bleeding or bruising -unusually weak or tired -vomiting -yellowing of the eyes or skin Side effects that usually do not require medical attention (report to your doctor or health care professional if they continue or are bothersome): -changes in emotions or moods -constipation -diarrhea -loss of appetite -headache -irritation at site where injected -nausea This list may not describe all possible side effects. Call your doctor for medical advice about side effects. You may report side effects to FDA at 1-800-FDA-1088. Where should I keep my medicine? This drug is given in a hospital or clinic and will not be stored at home. NOTE: This sheet is a summary. It may not cover all possible information. If you have questions about this medicine, talk to your doctor, pharmacist, or health care provider.    2016, Elsevier/Gold Standard. (2014-03-07 14:47:04)  Blood Transfusion  A blood transfusion  is a procedure in which you receive donated blood through an IV tube. You may need a blood transfusion because of illness, surgery, or injury. The blood may come from a donor, or it may be your own blood that you donated previously. The blood given in a transfusion is made up of different types of cells. You may receive: 1. Red blood cells. These carry oxygen and replace lost blood. 2. Platelets. These control bleeding. 3. Plasma. Thishelps blood to clot. If you have hemophilia or another clotting disorder, you may also receive other types of blood products. LET [facility] CARE PROVIDER KNOW ABOUT: 1. Any allergies you have. 2. All medicines you are taking, including vitamins, herbs, eye drops, creams, and over-the-counter medicines. 3. Previous problems you or members of your family have had with the use of anesthetics. 4. Any blood disorders you have. 5. Previous surgeries you have had. 6. Any medical conditions you may have. 7. Any previous reactions you have had during a blood transfusion.  RISKS AND COMPLICATIONS Generally, this is a safe procedure. However, problems may occur, including:  Having an allergic reaction to something in the donated blood.  Fever. This  may be a reaction to the white blood cells in the transfused blood.  Iron overload. This can happen from having many transfusions.  Transfusion-related acute lung injury (TRALI). This is a rare reaction that causes lung damage. The cause is not known.TRALI can occur within hours of a transfusion or several days later.  Sudden (acute) or delayed hemolytic reactions. This happens if your blood does not match the cells in your transfusion. Your body's defense system (immune system) may try to attack the new cells. This complication is rare.  Infection. This is rare. BEFORE THE PROCEDURE  You may have a blood test to determine your blood type. This is necessary to know what kind of blood your body will accept.  If you  are going to have a planned surgery, you may donate your own blood. This may be done in case you need to have a transfusion.  If you have had an allergic reaction to a transfusion in the past, you may be given medicine to help prevent a reaction. Take this medicine only as directed by your health care provider.  You will have your temperature, blood pressure, and pulse monitored before the transfusion. PROCEDURE   An IV will be started in your hand or arm.  The bag of donated blood will be attached to your IV tube and given into your vein.  Your temperature, blood pressure, and pulse will be monitored regularly during the transfusion. This monitoring is done to detect early signs of a transfusion reaction.  If you have any signs or symptoms of a reaction, your transfusion will be stopped and you may be given medicine.  When the transfusion is over, your IV will be removed.  Pressure may be applied to the IV site for a few minutes.  A bandage (dressing) will be applied. The procedure may vary among health care providers and hospitals. AFTER THE PROCEDURE  Your blood pressure, temperature, and pulse will be monitored regularly.   This information is not intended to replace advice given to you by your health care provider. Make sure you discuss any questions you have with your health care provider.   Document Released: 01/04/2000 Document Revised: 01/27/2014 Document Reviewed: 11/16/2013 Elsevier Interactive Patient Education 2016 [place] Discharge Instructions for Patients Receiving Chemotherapy  Today you received the following chemotherapy agents Velcade/Darzalex.  To help prevent nausea and vomiting after your treatment, we encourage you to take your nausea medication as directed.   If you develop nausea and vomiting that is not controlled by your nausea medication, call the clinic.   BELOW ARE SYMPTOMS THAT SHOULD BE REPORTED  IMMEDIATELY:  *FEVER GREATER THAN 100.5 F  *CHILLS WITH OR WITHOUT FEVER  NAUSEA AND VOMITING THAT IS NOT CONTROLLED WITH YOUR NAUSEA MEDICATION  *UNUSUAL SHORTNESS OF BREATH  *UNUSUAL BRUISING OR BLEEDING  TENDERNESS IN MOUTH AND THROAT WITH OR WITHOUT PRESENCE OF ULCERS  *URINARY PROBLEMS  *BOWEL PROBLEMS  UNUSUAL RASH Items with * indicate a potential emergency and should be followed up as soon as possible.  Feel free to call the clinic you have any questions or concerns. The clinic phone number is [phone].  Please show the [place] at check-in to the Emergency Department and triage nurse.

## 2015-05-07 ENCOUNTER — Other Ambulatory Visit: Payer: Medicare Other

## 2015-05-07 ENCOUNTER — Ambulatory Visit: Payer: Medicare Other | Admitting: Internal Medicine

## 2015-05-08 ENCOUNTER — Other Ambulatory Visit (HOSPITAL_BASED_OUTPATIENT_CLINIC_OR_DEPARTMENT_OTHER): Payer: Medicare Other

## 2015-05-08 DIAGNOSIS — C9000 Multiple myeloma not having achieved remission: Secondary | ICD-10-CM

## 2015-05-08 LAB — CBC WITH DIFFERENTIAL/PLATELET
BASO%: 0.3 % (ref 0.0–2.0)
BASOS ABS: 0 10*3/uL (ref 0.0–0.1)
EOS ABS: 0 10*3/uL (ref 0.0–0.5)
EOS%: 0.1 % (ref 0.0–7.0)
HEMATOCRIT: 26.9 % — AB (ref 38.4–49.9)
HEMOGLOBIN: 8.5 g/dL — AB (ref 13.0–17.1)
LYMPH#: 0.2 10*3/uL — AB (ref 0.9–3.3)
LYMPH%: 5.1 % — ABNORMAL LOW (ref 14.0–49.0)
MCH: 25.7 pg — AB (ref 27.2–33.4)
MCHC: 31.7 g/dL — ABNORMAL LOW (ref 32.0–36.0)
MCV: 81.3 fL (ref 79.3–98.0)
MONO#: 0.1 10*3/uL (ref 0.1–0.9)
MONO%: 2 % (ref 0.0–14.0)
NEUT%: 92.5 % — ABNORMAL HIGH (ref 39.0–75.0)
NEUTROS ABS: 3.7 10*3/uL (ref 1.5–6.5)
PLATELETS: 82 10*3/uL — AB (ref 140–400)
RBC: 3.31 10*6/uL — ABNORMAL LOW (ref 4.20–5.82)
RDW: 16 % — AB (ref 11.0–14.6)
WBC: 4 10*3/uL (ref 4.0–10.3)

## 2015-05-08 LAB — COMPREHENSIVE METABOLIC PANEL
ALBUMIN: 2.8 g/dL — AB (ref 3.5–5.0)
ALT: 10 U/L (ref 0–55)
ANION GAP: 9 meq/L (ref 3–11)
AST: 18 U/L (ref 5–34)
Alkaline Phosphatase: 44 U/L (ref 40–150)
BUN: 24.1 mg/dL (ref 7.0–26.0)
CALCIUM: 9.3 mg/dL (ref 8.4–10.4)
CO2: 29 mEq/L (ref 22–29)
Chloride: 99 mEq/L (ref 98–109)
Creatinine: 1.8 mg/dL — ABNORMAL HIGH (ref 0.7–1.3)
EGFR: 44 mL/min/{1.73_m2} — AB (ref >90)
GLUCOSE: 201 mg/dL — AB (ref 70–140)
POTASSIUM: 3.9 meq/L (ref 3.5–5.1)
SODIUM: 138 meq/L (ref 136–145)
TOTAL PROTEIN: 6.9 g/dL (ref 6.4–8.3)
Total Bilirubin: 1.52 mg/dL — ABNORMAL HIGH (ref 0.20–1.20)

## 2015-05-08 LAB — TECHNOLOGIST REVIEW

## 2015-05-11 ENCOUNTER — Ambulatory Visit (HOSPITAL_BASED_OUTPATIENT_CLINIC_OR_DEPARTMENT_OTHER): Payer: Medicare Other

## 2015-05-11 VITALS — BP 108/58 | HR 63 | Temp 98.7°F | Resp 16

## 2015-05-11 DIAGNOSIS — Z5112 Encounter for antineoplastic immunotherapy: Secondary | ICD-10-CM | POA: Diagnosis present

## 2015-05-11 DIAGNOSIS — C9000 Multiple myeloma not having achieved remission: Secondary | ICD-10-CM

## 2015-05-11 DIAGNOSIS — C9002 Multiple myeloma in relapse: Secondary | ICD-10-CM

## 2015-05-11 MED ORDER — SODIUM CHLORIDE 0.9 % IV SOLN
Freq: Once | INTRAVENOUS | Status: AC
  Administered 2015-05-11: 08:00:00 via INTRAVENOUS

## 2015-05-11 MED ORDER — DIPHENHYDRAMINE HCL 25 MG PO CAPS
50.0000 mg | ORAL_CAPSULE | Freq: Once | ORAL | Status: AC
  Administered 2015-05-11: 50 mg via ORAL

## 2015-05-11 MED ORDER — DARATUMUMAB CHEMO INJECTION 400 MG/20ML
16.0000 mg/kg | Freq: Once | INTRAVENOUS | Status: AC
  Administered 2015-05-11: 1200 mg via INTRAVENOUS
  Filled 2015-05-11: qty 60

## 2015-05-11 MED ORDER — HEPARIN SOD (PORK) LOCK FLUSH 100 UNIT/ML IV SOLN
500.0000 [IU] | Freq: Once | INTRAVENOUS | Status: AC | PRN
  Administered 2015-05-11: 500 [IU]
  Filled 2015-05-11: qty 5

## 2015-05-11 MED ORDER — DIPHENHYDRAMINE HCL 25 MG PO CAPS
ORAL_CAPSULE | ORAL | Status: AC
  Filled 2015-05-11: qty 2

## 2015-05-11 MED ORDER — METHYLPREDNISOLONE SODIUM SUCC 125 MG IJ SOLR
125.0000 mg | Freq: Once | INTRAMUSCULAR | Status: AC
  Administered 2015-05-11: 125 mg via INTRAVENOUS

## 2015-05-11 MED ORDER — PROCHLORPERAZINE MALEATE 10 MG PO TABS
10.0000 mg | ORAL_TABLET | Freq: Once | ORAL | Status: AC
  Administered 2015-05-11: 10 mg via ORAL

## 2015-05-11 MED ORDER — BORTEZOMIB CHEMO SQ INJECTION 3.5 MG (2.5MG/ML)
1.3000 mg/m2 | Freq: Once | INTRAMUSCULAR | Status: AC
  Administered 2015-05-11: 2.5 mg via SUBCUTANEOUS
  Filled 2015-05-11: qty 2.5

## 2015-05-11 MED ORDER — ACETAMINOPHEN 325 MG PO TABS
650.0000 mg | ORAL_TABLET | Freq: Once | ORAL | Status: AC
  Administered 2015-05-11: 650 mg via ORAL

## 2015-05-11 MED ORDER — METHYLPREDNISOLONE SODIUM SUCC 125 MG IJ SOLR
INTRAMUSCULAR | Status: AC
  Filled 2015-05-11: qty 2

## 2015-05-11 MED ORDER — SODIUM CHLORIDE 0.9% FLUSH
10.0000 mL | INTRAVENOUS | Status: DC | PRN
  Administered 2015-05-11: 10 mL
  Filled 2015-05-11: qty 10

## 2015-05-11 MED ORDER — ACETAMINOPHEN 325 MG PO TABS
ORAL_TABLET | ORAL | Status: AC
  Filled 2015-05-11: qty 2

## 2015-05-11 MED ORDER — PROCHLORPERAZINE MALEATE 10 MG PO TABS
ORAL_TABLET | ORAL | Status: AC
  Filled 2015-05-11: qty 1

## 2015-05-11 NOTE — Progress Notes (Signed)
Reviewed labs with Dr. Julien Nordmann, ok to treat today.

## 2015-05-11 NOTE — Patient Instructions (Signed)
[facility] Discharge Instructions for Patients Receiving Chemotherapy  Today you received the following chemotherapy agents darzalex/velcade   To help prevent nausea and vomiting after your treatment, we encourage you to take your nausea medication as directed  If you develop nausea and vomiting that is not controlled by your nausea medication, call the clinic.   BELOW ARE SYMPTOMS THAT SHOULD BE REPORTED IMMEDIATELY:  *FEVER GREATER THAN 100.5 F  *CHILLS WITH OR WITHOUT FEVER  NAUSEA AND VOMITING THAT IS NOT CONTROLLED WITH YOUR NAUSEA MEDICATION  *UNUSUAL SHORTNESS OF BREATH  *UNUSUAL BRUISING OR BLEEDING  TENDERNESS IN MOUTH AND THROAT WITH OR WITHOUT PRESENCE OF ULCERS  *URINARY PROBLEMS  *BOWEL PROBLEMS  UNUSUAL RASH Items with * indicate a potential emergency and should be followed up as soon as possible.  Feel free to call the clinic you have any questions or concerns. The clinic phone number is [phone].  

## 2015-05-14 ENCOUNTER — Ambulatory Visit (HOSPITAL_BASED_OUTPATIENT_CLINIC_OR_DEPARTMENT_OTHER): Payer: Medicare Other | Admitting: Internal Medicine

## 2015-05-14 ENCOUNTER — Other Ambulatory Visit: Payer: Medicare Other

## 2015-05-14 ENCOUNTER — Telehealth: Payer: Self-pay | Admitting: Internal Medicine

## 2015-05-14 ENCOUNTER — Other Ambulatory Visit (HOSPITAL_BASED_OUTPATIENT_CLINIC_OR_DEPARTMENT_OTHER): Payer: Medicare Other

## 2015-05-14 ENCOUNTER — Ambulatory Visit (HOSPITAL_BASED_OUTPATIENT_CLINIC_OR_DEPARTMENT_OTHER): Payer: Medicare Other

## 2015-05-14 ENCOUNTER — Other Ambulatory Visit: Payer: Self-pay | Admitting: Medical Oncology

## 2015-05-14 ENCOUNTER — Encounter: Payer: Self-pay | Admitting: Internal Medicine

## 2015-05-14 VITALS — BP 133/72 | HR 76 | Temp 98.0°F | Resp 18 | Ht 71.0 in | Wt 161.3 lb

## 2015-05-14 DIAGNOSIS — D649 Anemia, unspecified: Secondary | ICD-10-CM

## 2015-05-14 DIAGNOSIS — R634 Abnormal weight loss: Secondary | ICD-10-CM | POA: Diagnosis not present

## 2015-05-14 DIAGNOSIS — I8290 Acute embolism and thrombosis of unspecified vein: Secondary | ICD-10-CM

## 2015-05-14 DIAGNOSIS — C9000 Multiple myeloma not having achieved remission: Secondary | ICD-10-CM | POA: Diagnosis present

## 2015-05-14 DIAGNOSIS — C9002 Multiple myeloma in relapse: Secondary | ICD-10-CM

## 2015-05-14 DIAGNOSIS — Z5111 Encounter for antineoplastic chemotherapy: Secondary | ICD-10-CM

## 2015-05-14 DIAGNOSIS — Z5112 Encounter for antineoplastic immunotherapy: Secondary | ICD-10-CM

## 2015-05-14 DIAGNOSIS — R630 Anorexia: Secondary | ICD-10-CM

## 2015-05-14 LAB — CBC WITH DIFFERENTIAL/PLATELET
BASO%: 0.9 % (ref 0.0–2.0)
BASOS ABS: 0 10*3/uL (ref 0.0–0.1)
EOS ABS: 0 10*3/uL (ref 0.0–0.5)
EOS%: 0.1 % (ref 0.0–7.0)
HEMATOCRIT: 24.6 % — AB (ref 38.4–49.9)
HEMOGLOBIN: 7.7 g/dL — AB (ref 13.0–17.1)
LYMPH%: 4.2 % — ABNORMAL LOW (ref 14.0–49.0)
MCH: 25.5 pg — AB (ref 27.2–33.4)
MCHC: 31.5 g/dL — AB (ref 32.0–36.0)
MCV: 81 fL (ref 79.3–98.0)
MONO#: 0.1 10*3/uL (ref 0.1–0.9)
MONO%: 3.9 % (ref 0.0–14.0)
NEUT#: 2.7 10*3/uL (ref 1.5–6.5)
NEUT%: 90.9 % — AB (ref 39.0–75.0)
Platelets: 90 10*3/uL — ABNORMAL LOW (ref 140–400)
RBC: 3.04 10*6/uL — ABNORMAL LOW (ref 4.20–5.82)
RDW: 15.3 % — ABNORMAL HIGH (ref 11.0–14.6)
WBC: 3 10*3/uL — ABNORMAL LOW (ref 4.0–10.3)
lymph#: 0.1 10*3/uL — ABNORMAL LOW (ref 0.9–3.3)

## 2015-05-14 LAB — COMPREHENSIVE METABOLIC PANEL
ALBUMIN: 3 g/dL — AB (ref 3.5–5.0)
ALK PHOS: 43 U/L (ref 40–150)
ALT: 11 U/L (ref 0–55)
AST: 22 U/L (ref 5–34)
Anion Gap: 12 mEq/L — ABNORMAL HIGH (ref 3–11)
BUN: 35.2 mg/dL — AB (ref 7.0–26.0)
CALCIUM: 10 mg/dL (ref 8.4–10.4)
CHLORIDE: 101 meq/L (ref 98–109)
CO2: 26 mEq/L (ref 22–29)
Creatinine: 2.5 mg/dL — ABNORMAL HIGH (ref 0.7–1.3)
EGFR: 29 mL/min/{1.73_m2} — AB (ref >90)
Glucose: 106 mg/dl (ref 70–140)
POTASSIUM: 4.3 meq/L (ref 3.5–5.1)
Sodium: 139 mEq/L (ref 136–145)
Total Bilirubin: 1.11 mg/dL (ref 0.20–1.20)
Total Protein: 7.6 g/dL (ref 6.4–8.3)

## 2015-05-14 LAB — TECHNOLOGIST REVIEW

## 2015-05-14 MED ORDER — PROCHLORPERAZINE MALEATE 10 MG PO TABS
ORAL_TABLET | ORAL | Status: AC
  Filled 2015-05-14: qty 1

## 2015-05-14 MED ORDER — PROCHLORPERAZINE MALEATE 10 MG PO TABS
10.0000 mg | ORAL_TABLET | Freq: Once | ORAL | Status: AC
  Administered 2015-05-14: 10 mg via ORAL

## 2015-05-14 MED ORDER — BORTEZOMIB CHEMO SQ INJECTION 3.5 MG (2.5MG/ML)
1.3000 mg/m2 | Freq: Once | INTRAMUSCULAR | Status: AC
  Administered 2015-05-14: 2.5 mg via SUBCUTANEOUS
  Filled 2015-05-14: qty 2.5

## 2015-05-14 NOTE — Progress Notes (Signed)
[place] Telephone:([phone]) [phone]   Fax:([phone]) [phone]  OFFICE PROGRESS NOTE  [name] [address] 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  2) the venous thrombosis diagnosed in June 2016  PRIOR THERAPY: :  1. Status post 6 cycles of systemic chemotherapy with Revlimid and Decadron, last dose was given 07/21/2010 with very good response. 2. 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 [name].  3. maintenance Revlimid at 10 mg by mouth daily status post 2 months. Therapy began 01/18/2011. 4. maintenance Revlimid at 15 mg by mouth daily with prophylactic dose Coumadin at 2 mg by mouth daily. 5. Systemic chemotherapy with Velcade at 1.3 mg per meter squared given on days 1, 4, 8 and 11 and Doxil at 30 mg per meter square given on day 4 and Decadron 40 mg by mouth on weekly basis given every 3 weeks. Status post 4 cycles. 6. Zometa 4 mg IV every 4 weeks.  7. Velcade 1.3 mg/M2 subcutaneous daily on a weekly basis with Decadron 20 mg by mouth on a weekly basis. First cycle expected on 06/21/2012. S/P 4 cycles. 8. Systemic chemotherapy with Carfilzomib, cyclophosphamide and Decadron. First cycle started on 08/09/2012. He is status post 19 cycles. 9. Systemic chemotherapy with Carfilzomib 36 MG/M2 on days 1, 2, 8, 9, 15 and 16 every 4 weeks, Pomalyst 4 mg by mouth daily for 21 days every 4 weeks and Decadron 40 mg by mouth weekly. First cycle started on 04/10/2014. Status post 12 cycles. Discontinued secondary to disease progression.    CURRENT THERAPY:  1) Daratumumab 16 mg/kg weekly, Velcade 1.3 MG/M2 on days 1, 4, 8 and 11 every 3 weeks in addition to Decadron 20 mg on days 1, 2, 4, 5, 8, 9, 11 and 12. He started the first dose on 04/20/2015. Status post one cycle. 2) Zometa every 12 weeks.  3) Xarelto 20 mg by mouth daily,  started 07/17/2014.   INTERVAL HISTORY: Eddie Thomas 70 y.o. male returns to the clinic today for followup visit. He is currently undergoing treatment with Daratumumab, Velcade and Decadron. Status post 1 cycle and tolerated the first cycle well. He lost few pounds recently because of lack of appetite and change of his taste. He also has mild fatigue with this treatment. He denied having any significant weight loss or night sweats. He has no fever or chills. He has no nausea or vomiting. The patient denied having any significant chest pain, shortness of breath, cough or hemoptysis. He is here today to resume her systemic chemotherapy with day 4 of the second cycle.   MEDICAL HISTORY: Past Medical History  Diagnosis Date  . Diabetes mellitus ([place]) 07/03/2011  . Hypertension 07/03/2011  . Hyperlipidemia 07/03/2011  . Colon polyps 2012  . Gastric ulcer   . Bulging lumbar disc   . Encounter for antineoplastic chemotherapy 03/05/2015  . Anemia   . Blood transfusion without reported diagnosis 2015  . Multiple myeloma 2011    currently undergoing chemo  . Cataract     ALLERGIES:  has No Known Allergies.  MEDICATIONS:  Current Outpatient Prescriptions  Medication Sig Dispense Refill  . Cholecalciferol (VITAMIN D-3) 5000 UNITS TABS Take 1 tablet by mouth daily.    [name] dexamethasone (DECADRON) 4 MG tablet 5 tab po on days 1,2,4,5,8,9,11 and 12 every 3 weeks, start first  day of chemo. 40 tablet 4  . gabapentin (NEURONTIN) 300 MG capsule Take 300 mg by mouth 3 (three) times daily. Reported on 04/25/2015    . lidocaine-prilocaine (EMLA) cream Apply 1 application topically as needed. Apply one hour before use and cover with plastic wrap. (Patient not taking: Reported on 04/25/2015) 30 g 1  . lisinopril-hydrochlorothiazide (PRINZIDE,ZESTORETIC) 20-12.5 MG tablet Take by mouth.    . montelukast (SINGULAIR) 10 MG tablet Take 10 mg by mouth.    . oxyCODONE-acetaminophen (PERCOCET/ROXICET) 5-325 MG tablet  Take 1 tablet by mouth every 6 (six) hours as needed for severe pain. (Patient not taking: Reported on 04/25/2015) 30 tablet 0  . pantoprazole (PROTONIX) 40 MG tablet TAKE ONE TABLET BY MOUTH TWICE DAILY 60 tablet 11  . pioglitazone (ACTOS) 15 MG tablet Take 15 mg by mouth daily. Reported on 04/25/2015    . prochlorperazine (COMPAZINE) 10 MG tablet Take 10 mg by mouth every 6 (six) hours as needed for nausea or vomiting. Reported on 04/25/2015    . simvastatin (ZOCOR) 10 MG tablet Take 10 mg by mouth at bedtime.      . temazepam (RESTORIL) 15 MG capsule Take 15 mg by mouth at bedtime as needed for sleep. Reported on 01/16/2015    . valACYclovir (VALTREX) 500 MG tablet TAKE ONE CAPLET BY MOUTH ONCE DAILY 30 tablet 3  . valACYclovir (VALTREX) 500 MG tablet TAKE ONE TABLET BY MOUTH ONCE DAILY 30 tablet 0  . VIAGRA 50 MG tablet Take 50 mg by mouth daily as needed for erectile dysfunction.     Alveda Reasons 20 MG TABS tablet TAKE ONE TABLET BY MOUTH ONCE DAILY WITH  SUPPER 30 tablet 0   No current facility-administered medications for this visit.    SURGICAL HISTORY:  Past Surgical History  Procedure Laterality Date  . Limbal stem cell transplant    . Humerus fracture surgery  2011    right  . 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  . Cataract extraction w/ intraocular lens implant Left 03/2015    REVIEW OF SYSTEMS:  A comprehensive review of systems was negative except for: Constitutional: positive for fatigue and weight loss   PHYSICAL EXAMINATION: General appearance: alert, cooperative and no distress Head: Normocephalic, without obvious abnormality, atraumatic Neck: no adenopathy, no JVD, supple, symmetrical, trachea midline and thyroid not enlarged, symmetric, no tenderness/mass/nodules Lymph nodes: Cervical, supraclavicular, and axillary nodes normal. Resp: clear to auscultation bilaterally Back: symmetric, no curvature. ROM normal. No CVA tenderness. Cardio: regular rate  and rhythm, S1, S2 normal, no murmur, click, rub or gallop GI: soft, non-tender; bowel sounds normal; no masses,  no organomegaly Extremities: extremities normal, atraumatic, no cyanosis or edema Neurologic: Alert and oriented X 3, normal strength and tone. Normal symmetric reflexes. Normal coordination and gait  ECOG PERFORMANCE STATUS: 1 - Symptomatic but completely ambulatory  Blood pressure 133/72, pulse 76, temperature 98 F (36.7 C), temperature source Oral, resp. rate 18, height _0  (1.803 m), weight 161 lb 4.8 oz (73.165 kg), SpO2 100 %.  LABORATORY DATA: Lab Results  Component Value Date   WBC 4.0 05/08/2015   HGB 8.5* 05/08/2015   HCT 26.9* 05/08/2015   MCV 81.3 05/08/2015   PLT 82* 05/08/2015      Chemistry      Component Value Date/Time   NA 138 05/08/2015 1438   NA 139 08/30/2013 0935   K 3.9 05/08/2015 1438   K 4.1 08/30/2013 0935  CL 101 08/30/2013 0935   CL 103 07/12/2012 0907   CO2 29 05/08/2015 1438   CO2 26 08/30/2013 0935   BUN 24.1 05/08/2015 1438   BUN 11 08/30/2013 0935   CREATININE 1.8* 05/08/2015 1438   CREATININE 1.03 08/30/2013 0935      Component Value Date/Time   CALCIUM 9.3 05/08/2015 1438   CALCIUM 9.2 08/30/2013 0935   ALKPHOS 44 05/08/2015 1438   ALKPHOS 39 08/30/2013 0935   AST 18 05/08/2015 1438   AST 13 08/30/2013 0935   ALT 10 05/08/2015 1438   ALT 9 08/30/2013 0935   BILITOT 1.52* 05/08/2015 1438   BILITOT 1.3* 08/30/2013 0935     Other lab results: Beta-2 microglobulin 2.80, free kappa light chain 0.76, free lambda light chain 101.73 with a kappa/lambda ratio of 0.01. IgG 91, IgA 1059 and IgM 5.  RADIOGRAPHIC STUDIES: No results found.  ASSESSMENT AND PLAN: This is a very pleasant 70 years old [place] American male with history of multiple myeloma currently undergoing systemic chemotherapy with Carfilzomib, cyclophosphamide and Decadron status post 19 cycles. This was discontinued secondary to disease progression. The  patient was started on systemic chemotherapy with Carfilzomib, Pomalyst and Decadron status post 12 cycles. I recommended for the patient to proceed with cycle #12 today as scheduled. Unfortunately he was found to have evidence for disease progression in his treatment was discontinued. He is currently undergoing treatment with Daratumumab, Velcade and Decadron. Status post 1 cycle and tolerated the first cycle of his treatment well except for fatigue and few pounds of weight loss secondary to lack of taste and appetite  I recommended for the patient to proceed with second cycle of his chemotherapy today as a scheduled. The patient would come back for follow-up visit in 3 weeks for reevaluation and management of any adverse effect of his treatment. He'll continue on Zometa every 3 months. For the venous thrombosis, he will continue on Xarelto 20 mg by mouth daily. He was advised to call immediately if he has any concerning symptoms in the interval.  The patient voices understanding of current disease status and treatment options and is in agreement with the current care plan. All questions were answered. The patient knows to call the clinic with any problems, questions or concerns. We can certainly see the patient much sooner if necessary.  Disclaimer: This note was dictated with voice recognition software. Similar sounding words can inadvertently be transcribed and may not be corrected upon review.

## 2015-05-14 NOTE — Progress Notes (Signed)
OK to treat despite CBC, BUN/Creat per Dr Julien Nordmann.

## 2015-05-14 NOTE — Progress Notes (Signed)
HAR done

## 2015-05-14 NOTE — Telephone Encounter (Signed)
Gave and printed appt sched and avs for pt for April thru JUNE °

## 2015-05-14 NOTE — Progress Notes (Signed)
Quick Note:  Call patient with the result and arrange for 2 units of PRBCs this week. ______ 

## 2015-05-14 NOTE — Patient Instructions (Signed)
[facility] Discharge Instructions for Patients Receiving Chemotherapy  Today you received the following chemotherapy agents Velcade  To help prevent nausea and vomiting after your treatment, we encourage you to take your nausea medication    If you develop nausea and vomiting that is not controlled by your nausea medication, call the clinic.   BELOW ARE SYMPTOMS THAT SHOULD BE REPORTED IMMEDIATELY:  *FEVER GREATER THAN 100.5 F  *CHILLS WITH OR WITHOUT FEVER  NAUSEA AND VOMITING THAT IS NOT CONTROLLED WITH YOUR NAUSEA MEDICATION  *UNUSUAL SHORTNESS OF BREATH  *UNUSUAL BRUISING OR BLEEDING  TENDERNESS IN MOUTH AND THROAT WITH OR WITHOUT PRESENCE OF ULCERS  *URINARY PROBLEMS  *BOWEL PROBLEMS  UNUSUAL RASH Items with * indicate a potential emergency and should be followed up as soon as possible.  Feel free to call the clinic you have any questions or concerns. The clinic phone number is [phone].  Please show the CHEMO ALERT CARD at check-in to the Emergency Department and triage nurse.   

## 2015-05-15 LAB — PREPARE RBC (CROSSMATCH)

## 2015-05-16 ENCOUNTER — Encounter: Payer: Self-pay | Admitting: Gastroenterology

## 2015-05-16 ENCOUNTER — Ambulatory Visit (AMBULATORY_SURGERY_CENTER): Payer: Medicare Other | Admitting: Gastroenterology

## 2015-05-16 VITALS — BP 154/79 | HR 59 | Temp 96.6°F | Resp 17 | Ht 71.0 in | Wt 162.0 lb

## 2015-05-16 DIAGNOSIS — R1319 Other dysphagia: Secondary | ICD-10-CM | POA: Diagnosis present

## 2015-05-16 DIAGNOSIS — K222 Esophageal obstruction: Secondary | ICD-10-CM

## 2015-05-16 DIAGNOSIS — R933 Abnormal findings on diagnostic imaging of other parts of digestive tract: Secondary | ICD-10-CM

## 2015-05-16 MED ORDER — SODIUM CHLORIDE 0.9 % IV SOLN: 500.0000 mL | INTRAVENOUS | Status: DC

## 2015-05-16 MED ORDER — FLUCONAZOLE 100 MG PO TABS: 100.0000 mg | ORAL_TABLET | Freq: Every day | ORAL | Status: DC

## 2015-05-16 NOTE — Op Note (Signed)
[facility] Patient Name: Eddie Thomas Procedure Date: 05/16/2015 9:42 AM MRN: [id number] Endoscopist: Ladene Artist , MD Age: 70 Date of Birth: May 31, 1945 Gender: Male Procedure:                Upper GI endoscopy Indications:              Dysphagia, Abnormal UGI series Medicines:                Monitored Anesthesia Care Procedure:                Pre-Anesthesia Assessment:                           - Prior to the procedure, a History and Physical                            was performed, and patient medications and                            allergies were reviewed. The patient's tolerance of                            previous anesthesia was also reviewed. The risks                            and benefits of the procedure and the sedation                            options and risks were discussed with the patient.                            All questions were answered, and informed consent                            was obtained. Prior Anticoagulants: The patient has                            taken Xarelto (rivaroxaban), last dose was 2 days                            prior to procedure. ASA Grade Assessment: III - A                            patient with severe systemic disease. After                            reviewing the risks and benefits, the patient was                            deemed in satisfactory condition to undergo the                            procedure.  After obtaining informed consent, the endoscope was                            passed under direct vision. Throughout the                            procedure, the patient's blood pressure, pulse, and                            oxygen saturations were monitored continuously. The                            Model GIF-[id number]) scope was introduced                            through the mouth, and advanced to the second part                            of duodenum. The  upper GI endoscopy was                            accomplished without difficulty. The patient                            tolerated the procedure well. Scope In: Scope Out: Findings:                 One moderate (circumferential scarring or stenosis)                            benign-appearing, intrinsic stenosis was found 37                            cm from the incisors. This measured 1.1 cm (inner                            diameter) and was traversed after dilation. A                            guidewire was placed and the scope was withdrawn.                            Dilation was performed with a Savary dilator with                            no resistance at 12 mm. Dilation was performed with                            a Savary dilator with mild resistance at 13 mm.                            Dilation was performed with a Savary dilator with  mild resistance at 14 mm. Dilation was performed                            with a Savary dilator with mild resistance at 15                            mm. Estimated blood loss was minimal with last                            dilator.                           Diffuse candidiasis was found in the upper third of                            the esophagus and in the middle third of the                            esophagus.                           A small hiatal hernia was present.                           The exam of the stomach was otherwise normal,                            including retroflexed view.                           The duodenal bulb and second portion of the                            duodenum were normal. Complications:            No immediate complications. Estimated Blood Loss:     Estimated blood loss was minimal. Impression:               - Benign-appearing esophageal stenosis. Dilated.                           - Monilial esophagitis.                           - Small hiatal  hernia. Recommendation:           - Patient has a contact number available for                            emergencies. The signs and symptoms of potential                            delayed complications were discussed with the                            patient. Return to normal activities tomorrow.  Written discharge instructions were provided to the                            patient.                           - Clear liquid diet for 2 hours then advance as                            tolerated to soft diet today. Resume prior diet                            with antireflux measures tomorrow                           - Resume Xarelto (rivaroxaban) at prior dose in 2                            days. Refer to managing physician for further                            adjustment of therapy.                           - Diflucan (fluconazole) 100 mg PO daily for 7                            weeks.                           - Continue Protonix 40 mg po daily long term Ladene Artist, MD 05/16/2015 10:11:57 AM This report has been signed electronically.

## 2015-05-16 NOTE — Progress Notes (Signed)
Report to PACU, RN, vss, BBS= Clear.  

## 2015-05-16 NOTE — Patient Instructions (Signed)
YOU HAD AN ENDOSCOPIC PROCEDURE TODAY AT [facility]:   Refer to the procedure report that was given to you for any specific questions about what was found during the examination.  If the procedure report does not answer your questions, please call your gastroenterologist to clarify.  If you requested that your care partner not be given the details of your procedure findings, then the procedure report has been included in a sealed envelope for you to review at your convenience later.  YOU SHOULD EXPECT: Some feelings of bloating in the abdomen. Passage of more gas than usual.  Walking can help get rid of the air that was put into your GI tract during the procedure and reduce the bloating. If you had a lower endoscopy (such as a colonoscopy or flexible sigmoidoscopy) you may notice spotting of blood in your stool or on the toilet paper. If you underwent a bowel prep for your procedure, you may not have a normal bowel movement for a few days.  Please Note:  You might notice some irritation and congestion in your nose or some drainage.  This is from the oxygen used during your procedure.  There is no need for concern and it should clear up in a day or so.  SYMPTOMS TO REPORT IMMEDIATELY:    Following upper endoscopy (EGD)  Vomiting of blood or coffee ground material  New chest pain or pain under the shoulder blades  Painful or persistently difficult swallowing  New shortness of breath  Fever of 100F or higher  Black, tarry-looking stools  For urgent or emergent issues, a gastroenterologist can be reached at any hour by calling [phone].   DIET: Your first meal following the procedure should be a small meal and then it is ok to progress to your normal diet. Heavy or fried foods are harder to digest and may make you feel nauseous or bloated.  Likewise, meals heavy in dairy and vegetables can increase bloating.  Drink plenty of fluids but you should avoid alcoholic beverages  for 24 hours. Clear liquids for 2 hours, then soft diet today. May resume regular diet tomorrow.   ACTIVITY:  You should plan to take it easy for the rest of today and you should NOT DRIVE or use heavy machinery until tomorrow (because of the sedation medicines used during the test).  Please read all your handouts given to you by your healthcare provider today. Resume xarelto in 2 days, Start diflucan for candida today. Rx sent to your pharmacy.   FOLLOW UP: Our staff will call the number listed on your records the next business day following your procedure to check on you and address any questions or concerns that you may have regarding the information given to you following your procedure. If we do not reach you, we will leave a message.  However, if you are feeling well and you are not experiencing any problems, there is no need to return our call.  We will assume that you have returned to your regular daily activities without incident.  If any biopsies were taken you will be contacted by phone or by letter within the next 1-3 weeks.  Please call us at [phone] if you have not heard about the biopsies in 3 weeks.    SIGNATURES/CONFIDENTIALITY: You and/or your care partner have signed paperwork which will be entered into your electronic medical record.  These signatures attest to the fact that that the information above on your After Visit  Summary has been reviewed and is understood.  Full responsibility of the confidentiality of this discharge information lies with you and/or your care-partner. 

## 2015-05-16 NOTE — Progress Notes (Signed)
Called to room to assist during endoscopic procedure.  Patient ID and intended procedure confirmed with present staff. Received instructions for my participation in the procedure from the performing physician.  

## 2015-05-17 ENCOUNTER — Ambulatory Visit (HOSPITAL_BASED_OUTPATIENT_CLINIC_OR_DEPARTMENT_OTHER): Payer: Medicare Other

## 2015-05-17 ENCOUNTER — Telehealth: Payer: Self-pay | Admitting: Medical Oncology

## 2015-05-17 ENCOUNTER — Telehealth: Payer: Self-pay | Admitting: *Deleted

## 2015-05-17 ENCOUNTER — Ambulatory Visit: Payer: Medicare Other | Admitting: Nutrition

## 2015-05-17 VITALS — BP 150/81 | HR 68 | Temp 98.1°F | Resp 18

## 2015-05-17 DIAGNOSIS — D630 Anemia in neoplastic disease: Secondary | ICD-10-CM

## 2015-05-17 DIAGNOSIS — D649 Anemia, unspecified: Secondary | ICD-10-CM

## 2015-05-17 MED ORDER — ACETAMINOPHEN 325 MG PO TABS
650.0000 mg | ORAL_TABLET | Freq: Once | ORAL | Status: AC
  Administered 2015-05-17: 650 mg via ORAL

## 2015-05-17 MED ORDER — ACETAMINOPHEN 325 MG PO TABS
ORAL_TABLET | ORAL | Status: AC
  Filled 2015-05-17: qty 2

## 2015-05-17 MED ORDER — DIPHENHYDRAMINE HCL 25 MG PO CAPS
ORAL_CAPSULE | ORAL | Status: AC
  Filled 2015-05-17: qty 1

## 2015-05-17 MED ORDER — HEPARIN SOD (PORK) LOCK FLUSH 100 UNIT/ML IV SOLN
500.0000 [IU] | Freq: Every day | INTRAVENOUS | Status: AC | PRN
  Administered 2015-05-17: 500 [IU]
  Filled 2015-05-17: qty 5

## 2015-05-17 MED ORDER — SODIUM CHLORIDE 0.9 % IV SOLN
250.0000 mL | Freq: Once | INTRAVENOUS | Status: AC
  Administered 2015-05-17: 250 mL via INTRAVENOUS

## 2015-05-17 MED ORDER — DIPHENHYDRAMINE HCL 25 MG PO CAPS
25.0000 mg | ORAL_CAPSULE | Freq: Once | ORAL | Status: AC
  Administered 2015-05-17: 25 mg via ORAL

## 2015-05-17 MED ORDER — SODIUM CHLORIDE 0.9% FLUSH
10.0000 mL | INTRAVENOUS | Status: AC | PRN
  Administered 2015-05-17: 10 mL
  Filled 2015-05-17: qty 10

## 2015-05-17 NOTE — Patient Instructions (Signed)

## 2015-05-17 NOTE — Progress Notes (Signed)
Nutrition follow-up completed with patient in chemotherapy.  Patient receives treatment for multiple myeloma. Current weight documented as 162 pounds on April 26. Patient denies nausea, vomiting, constipation, and diarrhea. Patient is frustrated because he is experiencing taste alterations.  Nutrition diagnosis: Food and nutrition related knowledge deficit related to taste alterations as evidenced by no prior need for nutrition related information.  Intervention: Patient educated on strategies for improving taste alterations. Recommended patient rinse mouth out with baking soda and salt water prior to eating. Reviewed other strategies and provided fact sheet. Questions were answered.  Teach back method used.  Monitoring, evaluation, goals: Patient will tolerate increased oral intake.  Next visit: Monday, May 15, during infusion.  **Disclaimer: This note was dictated with voice recognition software. Similar sounding words can inadvertently be transcribed and this note may contain transcription errors which may not have been corrected upon publication of note.**

## 2015-05-17 NOTE — Telephone Encounter (Signed)
I asked Eddie Thomas to please fax the schedule for pt prednisone .

## 2015-05-17 NOTE — Telephone Encounter (Signed)
  Follow up Call-  Call back number 05/16/2015  Post procedure Call Back phone  # [phone]  Permission to leave phone message Yes     Patient questions:  Do you have a fever, pain , or abdominal swelling? No. Pain Score  0 *  Have you tolerated food without any problems? Yes.    Have you been able to return to your normal activities? Yes.    Do you have any questions about your discharge instructions: Diet   No. Medications  No. Follow up visit  No.  Do you have questions or concerns about your Care? No.  Actions: * If pain score is 4 or above: No action needed, pain <4.

## 2015-05-17 NOTE — Telephone Encounter (Signed)
Received may calendar for decadron dates and gave to pt .

## 2015-05-18 ENCOUNTER — Ambulatory Visit (HOSPITAL_BASED_OUTPATIENT_CLINIC_OR_DEPARTMENT_OTHER): Payer: Medicare Other

## 2015-05-18 ENCOUNTER — Encounter: Payer: Self-pay | Admitting: General Practice

## 2015-05-18 VITALS — BP 148/81 | HR 69 | Temp 97.6°F | Resp 18

## 2015-05-18 DIAGNOSIS — C9002 Multiple myeloma in relapse: Secondary | ICD-10-CM | POA: Diagnosis not present

## 2015-05-18 DIAGNOSIS — C9000 Multiple myeloma not having achieved remission: Secondary | ICD-10-CM | POA: Diagnosis not present

## 2015-05-18 DIAGNOSIS — Z5112 Encounter for antineoplastic immunotherapy: Secondary | ICD-10-CM

## 2015-05-18 LAB — TYPE AND SCREEN
ABO/RH(D): AB POS
ANTIBODY SCREEN: POSITIVE
DAT, IgG: NEGATIVE
Unit division: 0
Unit division: 0

## 2015-05-18 MED ORDER — SODIUM CHLORIDE 0.9% FLUSH
10.0000 mL | INTRAVENOUS | Status: DC | PRN
  Administered 2015-05-18: 10 mL
  Filled 2015-05-18: qty 10

## 2015-05-18 MED ORDER — PROCHLORPERAZINE MALEATE 10 MG PO TABS: 10.0000 mg | ORAL_TABLET | Freq: Once | ORAL | Status: DC

## 2015-05-18 MED ORDER — SODIUM CHLORIDE 0.9 % IV SOLN
Freq: Once | INTRAVENOUS | Status: AC
  Administered 2015-05-18: 09:00:00 via INTRAVENOUS

## 2015-05-18 MED ORDER — SODIUM CHLORIDE 0.9 % IV SOLN
16.0000 mg/kg | Freq: Once | INTRAVENOUS | Status: AC
  Administered 2015-05-18: 1200 mg via INTRAVENOUS
  Filled 2015-05-18: qty 60

## 2015-05-18 MED ORDER — METHYLPREDNISOLONE SODIUM SUCC 125 MG IJ SOLR
INTRAMUSCULAR | Status: AC
  Filled 2015-05-18: qty 2

## 2015-05-18 MED ORDER — PROCHLORPERAZINE MALEATE 10 MG PO TABS
10.0000 mg | ORAL_TABLET | Freq: Once | ORAL | Status: AC
  Administered 2015-05-18: 10 mg via ORAL

## 2015-05-18 MED ORDER — DIPHENHYDRAMINE HCL 25 MG PO CAPS
ORAL_CAPSULE | ORAL | Status: AC
  Filled 2015-05-18: qty 2

## 2015-05-18 MED ORDER — ACETAMINOPHEN 325 MG PO TABS
650.0000 mg | ORAL_TABLET | Freq: Once | ORAL | Status: AC
  Administered 2015-05-18: 650 mg via ORAL

## 2015-05-18 MED ORDER — PROCHLORPERAZINE MALEATE 10 MG PO TABS
ORAL_TABLET | ORAL | Status: AC
  Filled 2015-05-18: qty 1

## 2015-05-18 MED ORDER — BORTEZOMIB CHEMO SQ INJECTION 3.5 MG (2.5MG/ML)
1.3000 mg/m2 | Freq: Once | INTRAMUSCULAR | Status: AC
  Administered 2015-05-18: 2.5 mg via SUBCUTANEOUS
  Filled 2015-05-18: qty 2.5

## 2015-05-18 MED ORDER — ACETAMINOPHEN 325 MG PO TABS
ORAL_TABLET | ORAL | Status: AC
  Filled 2015-05-18: qty 2

## 2015-05-18 MED ORDER — HEPARIN SOD (PORK) LOCK FLUSH 100 UNIT/ML IV SOLN
500.0000 [IU] | Freq: Once | INTRAVENOUS | Status: AC | PRN
  Administered 2015-05-18: 500 [IU]
  Filled 2015-05-18: qty 5

## 2015-05-18 MED ORDER — METHYLPREDNISOLONE SODIUM SUCC 125 MG IJ SOLR
125.0000 mg | Freq: Once | INTRAMUSCULAR | Status: AC
  Administered 2015-05-18: 125 mg via INTRAVENOUS

## 2015-05-18 MED ORDER — DIPHENHYDRAMINE HCL 25 MG PO CAPS
50.0000 mg | ORAL_CAPSULE | Freq: Once | ORAL | Status: AC
Start: 2015-05-18 — End: 2015-05-18
  Administered 2015-05-18: 50 mg via ORAL

## 2015-05-18 NOTE — Patient Instructions (Signed)
[facility] Discharge Instructions for Patients Receiving Chemotherapy  Today you received the following chemotherapy agents Darzalex.  To help prevent nausea and vomiting after your treatment, we encourage you to take your nausea medication as directed.  If you develop nausea and vomiting that is not controlled by your nausea medication, call the clinic.   BELOW ARE SYMPTOMS THAT SHOULD BE REPORTED IMMEDIATELY:  *FEVER GREATER THAN 100.5 F  *CHILLS WITH OR WITHOUT FEVER  NAUSEA AND VOMITING THAT IS NOT CONTROLLED WITH YOUR NAUSEA MEDICATION  *UNUSUAL SHORTNESS OF BREATH  *UNUSUAL BRUISING OR BLEEDING  TENDERNESS IN MOUTH AND THROAT WITH OR WITHOUT PRESENCE OF ULCERS  *URINARY PROBLEMS  *BOWEL PROBLEMS  UNUSUAL RASH Items with * indicate a potential emergency and should be followed up as soon as possible.  Feel free to call the clinic you have any questions or concerns. The clinic phone number is [phone].  Please show the CHEMO ALERT CARD at check-in to the Emergency Department and triage nurse.    

## 2015-05-18 NOTE — Progress Notes (Signed)
Spiritual Care Note  Following Bill through Multiple Myeloma Support Group.  Visited today in infusion for f/u support, particularly given his recent low mood (due at least in part to MM complications, med change, and lack of break from tx) and his wife's recovery from a significant fall.  He engaged in conversation, appearing more down than his previous baseline. He also named his struggle with taste and appetite since starting new chemo.  Provided pastoral presence, emotional support, and encouragement. Will continue to support him through emotional care, prayer, and MM Group, but please also page as needs arise.  Thank you.   [place], [place], [facility] Pager [phone] Voicemail  [phone]

## 2015-05-21 ENCOUNTER — Ambulatory Visit: Payer: Medicare Other | Admitting: Internal Medicine

## 2015-05-21 ENCOUNTER — Other Ambulatory Visit: Payer: Self-pay | Admitting: Internal Medicine

## 2015-05-21 ENCOUNTER — Other Ambulatory Visit (HOSPITAL_BASED_OUTPATIENT_CLINIC_OR_DEPARTMENT_OTHER): Payer: Medicare Other

## 2015-05-21 ENCOUNTER — Ambulatory Visit: Payer: Medicare Other

## 2015-05-21 DIAGNOSIS — C9000 Multiple myeloma not having achieved remission: Secondary | ICD-10-CM

## 2015-05-21 LAB — COMPREHENSIVE METABOLIC PANEL
ALT: 11 U/L (ref 0–55)
AST: 27 U/L (ref 5–34)
Albumin: 2.8 g/dL — ABNORMAL LOW (ref 3.5–5.0)
Alkaline Phosphatase: 47 U/L (ref 40–150)
Anion Gap: 10 mEq/L (ref 3–11)
BUN: 13.6 mg/dL (ref 7.0–26.0)
CALCIUM: 8.7 mg/dL (ref 8.4–10.4)
CHLORIDE: 104 meq/L (ref 98–109)
CO2: 26 mEq/L (ref 22–29)
CREATININE: 1.6 mg/dL — AB (ref 0.7–1.3)
EGFR: 48 mL/min/{1.73_m2} — ABNORMAL LOW (ref >90)
Glucose: 153 mg/dl — ABNORMAL HIGH (ref 70–140)
Potassium: 3.4 mEq/L — ABNORMAL LOW (ref 3.5–5.1)
Sodium: 139 mEq/L (ref 136–145)
Total Bilirubin: 1.76 mg/dL — ABNORMAL HIGH (ref 0.20–1.20)
Total Protein: 7.1 g/dL (ref 6.4–8.3)

## 2015-05-21 LAB — CBC WITH DIFFERENTIAL/PLATELET
BASO%: 0.7 % (ref 0.0–2.0)
Basophils Absolute: 0 10*3/uL (ref 0.0–0.1)
EOS%: 0 % (ref 0.0–7.0)
Eosinophils Absolute: 0 10*3/uL (ref 0.0–0.5)
HEMATOCRIT: 27.9 % — AB (ref 38.4–49.9)
HGB: 9 g/dL — ABNORMAL LOW (ref 13.0–17.1)
LYMPH#: 0.1 10*3/uL — AB (ref 0.9–3.3)
LYMPH%: 5.1 % — ABNORMAL LOW (ref 14.0–49.0)
MCH: 26.6 pg — ABNORMAL LOW (ref 27.2–33.4)
MCHC: 32.3 g/dL (ref 32.0–36.0)
MCV: 82.5 fL (ref 79.3–98.0)
MONO#: 0 10*3/uL — AB (ref 0.1–0.9)
MONO%: 2.9 % (ref 0.0–14.0)
NEUT%: 91.3 % — ABNORMAL HIGH (ref 39.0–75.0)
NEUTROS ABS: 1.3 10*3/uL — AB (ref 1.5–6.5)
NRBC: 3 % — AB (ref 0–0)
Platelets: 22 10*3/uL — ABNORMAL LOW (ref 140–400)
RBC: 3.38 10*6/uL — ABNORMAL LOW (ref 4.20–5.82)
RDW: 15.2 % — AB (ref 11.0–14.6)
WBC: 1.4 10*3/uL — AB (ref 4.0–10.3)

## 2015-05-21 LAB — TECHNOLOGIST REVIEW

## 2015-05-21 MED ORDER — PROCHLORPERAZINE MALEATE 10 MG PO TABS
ORAL_TABLET | ORAL | Status: AC
  Filled 2015-05-21: qty 1

## 2015-05-21 NOTE — Progress Notes (Signed)
CBC reviewed w/ Dr. Julien Nordmann.   Treatment canceled this week.  Instructed pt to return next week on Monday for lab only.  Instructed pt to call for any bleeding or fever greater than 100.5 F or any other new symptoms or concerns prior to next appointment.  Pt verbalized understanding.

## 2015-05-25 ENCOUNTER — Ambulatory Visit: Payer: Medicare Other

## 2015-05-28 ENCOUNTER — Other Ambulatory Visit (HOSPITAL_BASED_OUTPATIENT_CLINIC_OR_DEPARTMENT_OTHER): Payer: Medicare Other

## 2015-05-28 ENCOUNTER — Telehealth: Payer: Self-pay | Admitting: *Deleted

## 2015-05-28 DIAGNOSIS — C9000 Multiple myeloma not having achieved remission: Secondary | ICD-10-CM

## 2015-05-28 LAB — COMPREHENSIVE METABOLIC PANEL
ALBUMIN: 2.8 g/dL — AB (ref 3.5–5.0)
ALK PHOS: 48 U/L (ref 40–150)
ALT: 11 U/L (ref 0–55)
AST: 25 U/L (ref 5–34)
Anion Gap: 13 mEq/L — ABNORMAL HIGH (ref 3–11)
BUN: 25.1 mg/dL (ref 7.0–26.0)
CHLORIDE: 98 meq/L (ref 98–109)
CO2: 28 meq/L (ref 22–29)
Calcium: 10 mg/dL (ref 8.4–10.4)
Creatinine: 3.3 mg/dL (ref 0.7–1.3)
EGFR: 21 mL/min/{1.73_m2} — AB (ref >90)
GLUCOSE: 133 mg/dL (ref 70–140)
POTASSIUM: 3.7 meq/L (ref 3.5–5.1)
SODIUM: 139 meq/L (ref 136–145)
Total Bilirubin: 1.39 mg/dL — ABNORMAL HIGH (ref 0.20–1.20)
Total Protein: 7.7 g/dL (ref 6.4–8.3)

## 2015-05-28 LAB — CBC WITH DIFFERENTIAL/PLATELET
BASO%: 0 % (ref 0.0–2.0)
BASOS ABS: 0 10*3/uL (ref 0.0–0.1)
EOS ABS: 0 10*3/uL (ref 0.0–0.5)
EOS%: 0.5 % (ref 0.0–7.0)
HCT: 26.8 % — ABNORMAL LOW (ref 38.4–49.9)
HGB: 8.5 g/dL — ABNORMAL LOW (ref 13.0–17.1)
LYMPH%: 8.5 % — AB (ref 14.0–49.0)
MCH: 26.2 pg — ABNORMAL LOW (ref 27.2–33.4)
MCHC: 31.7 g/dL — AB (ref 32.0–36.0)
MCV: 82.5 fL (ref 79.3–98.0)
MONO#: 0.1 10*3/uL (ref 0.1–0.9)
MONO%: 2.4 % (ref 0.0–14.0)
NEUT#: 1.9 10*3/uL (ref 1.5–6.5)
NEUT%: 88.6 % — AB (ref 39.0–75.0)
Platelets: 79 10*3/uL — ABNORMAL LOW (ref 140–400)
RBC: 3.25 10*6/uL — AB (ref 4.20–5.82)
RDW: 15.8 % — ABNORMAL HIGH (ref 11.0–14.6)
WBC: 2.1 10*3/uL — ABNORMAL LOW (ref 4.0–10.3)
lymph#: 0.2 10*3/uL — ABNORMAL LOW (ref 0.9–3.3)
nRBC: 0 % (ref 0–0)

## 2015-05-28 NOTE — Telephone Encounter (Signed)
Cmet reviewed by MD, per MD, pt to increase fluid intake and come in for IVF tomorrow. Notified pt, who requested an appt before 1200. POF to scheduling

## 2015-05-30 ENCOUNTER — Telehealth: Payer: Self-pay | Admitting: *Deleted

## 2015-05-30 DIAGNOSIS — K222 Esophageal obstruction: Secondary | ICD-10-CM

## 2015-05-30 MED ORDER — FLUCONAZOLE 100 MG PO TABS: 100.0000 mg | ORAL_TABLET | Freq: Every day | ORAL | Status: DC

## 2015-05-30 NOTE — Telephone Encounter (Signed)
Per staff message and POF I have scheduled appts. Advised scheduler of appts. JMW  

## 2015-05-30 NOTE — Telephone Encounter (Signed)
Returned patient's call as requested.  "I have a question about diflucan.  I am having trouble eating.  I try to eat but everything tastes so bad I just stop.  I'm loosing weight.  At a support meeting yesterday someone mentioned DIFLUCAN helps with this.  I'm willing to try anything."    Asked if his tongue has any white spots or coating.  Admits to white coating on tongue.  Denies sore throat or trouble swallowing.  Will notify Dr. Julien Nordmann.  Return number [phone]. Advised he use biotene mouth care products.

## 2015-05-30 NOTE — Telephone Encounter (Signed)
Please offer [facility] for possible thrush

## 2015-05-30 NOTE — Telephone Encounter (Signed)
Called patient offering [facility].  Reports "everything has been taken care of.  I spoke with collaborative nurse who has called in [place]."  This nurse offered he try a variety of different foods and ice cream.  Admits he is able to tolerate ice cream.  For smoothies, add fruits and peanut butter to ice cream to change flavor and add fiber, vitamins and minerals.  Denies any further needs at this time.

## 2015-05-31 ENCOUNTER — Other Ambulatory Visit: Payer: Self-pay | Admitting: Medical Oncology

## 2015-05-31 ENCOUNTER — Ambulatory Visit (HOSPITAL_BASED_OUTPATIENT_CLINIC_OR_DEPARTMENT_OTHER): Payer: Medicare Other

## 2015-05-31 VITALS — BP 139/77 | HR 82 | Temp 98.5°F | Resp 18

## 2015-05-31 DIAGNOSIS — N08 Glomerular disorders in diseases classified elsewhere: Principal | ICD-10-CM

## 2015-05-31 DIAGNOSIS — E860 Dehydration: Secondary | ICD-10-CM

## 2015-05-31 DIAGNOSIS — R7989 Other specified abnormal findings of blood chemistry: Secondary | ICD-10-CM

## 2015-05-31 DIAGNOSIS — C9000 Multiple myeloma not having achieved remission: Secondary | ICD-10-CM

## 2015-05-31 MED ORDER — SODIUM CHLORIDE 0.9% FLUSH
10.0000 mL | INTRAVENOUS | Status: AC | PRN
  Administered 2015-05-31: 10 mL
  Filled 2015-05-31: qty 10

## 2015-05-31 MED ORDER — HEPARIN SOD (PORK) LOCK FLUSH 100 UNIT/ML IV SOLN
500.0000 [IU] | INTRAVENOUS | Status: AC | PRN
  Administered 2015-05-31: 500 [IU]
  Filled 2015-05-31: qty 5

## 2015-05-31 MED ORDER — SODIUM CHLORIDE 0.9 % IV SOLN
Freq: Once | INTRAVENOUS | Status: DC
  Administered 2015-05-31: 09:00:00 via INTRAVENOUS

## 2015-05-31 NOTE — Progress Notes (Signed)
I left message for pt to come in for labs tomorrow at 0745 to recheck CMP.

## 2015-05-31 NOTE — Patient Instructions (Signed)

## 2015-06-01 ENCOUNTER — Encounter: Payer: Self-pay | Admitting: Nurse Practitioner

## 2015-06-01 ENCOUNTER — Ambulatory Visit (HOSPITAL_BASED_OUTPATIENT_CLINIC_OR_DEPARTMENT_OTHER): Payer: Medicare Other | Admitting: Nurse Practitioner

## 2015-06-01 ENCOUNTER — Inpatient Hospital Stay (HOSPITAL_COMMUNITY): Payer: Medicare Other

## 2015-06-01 ENCOUNTER — Other Ambulatory Visit: Payer: Self-pay | Admitting: Nurse Practitioner

## 2015-06-01 ENCOUNTER — Inpatient Hospital Stay (HOSPITAL_COMMUNITY)
Admission: AD | Admit: 2015-06-01 | Discharge: 2015-06-15 | DRG: 673 | Disposition: A | Discharge status: Hospice | Payer: Medicare Other | Source: Ambulatory Visit | Attending: Internal Medicine | Admitting: Internal Medicine

## 2015-06-01 ENCOUNTER — Encounter (HOSPITAL_COMMUNITY): Payer: Self-pay | Admitting: *Deleted

## 2015-06-01 ENCOUNTER — Ambulatory Visit: Payer: Medicare Other

## 2015-06-01 ENCOUNTER — Telehealth: Payer: Self-pay | Admitting: Medical Oncology

## 2015-06-01 ENCOUNTER — Other Ambulatory Visit (HOSPITAL_BASED_OUTPATIENT_CLINIC_OR_DEPARTMENT_OTHER): Payer: Medicare Other

## 2015-06-01 DIAGNOSIS — R066 Hiccough: Secondary | ICD-10-CM | POA: Diagnosis present

## 2015-06-01 DIAGNOSIS — C9000 Multiple myeloma not having achieved remission: Secondary | ICD-10-CM

## 2015-06-01 DIAGNOSIS — F05 Delirium due to known physiological condition: Secondary | ICD-10-CM | POA: Diagnosis not present

## 2015-06-01 DIAGNOSIS — D61810 Antineoplastic chemotherapy induced pancytopenia: Secondary | ICD-10-CM | POA: Diagnosis not present

## 2015-06-01 DIAGNOSIS — Z7901 Long term (current) use of anticoagulants: Secondary | ICD-10-CM

## 2015-06-01 DIAGNOSIS — R072 Precordial pain: Secondary | ICD-10-CM | POA: Diagnosis not present

## 2015-06-01 DIAGNOSIS — G9340 Encephalopathy, unspecified: Secondary | ICD-10-CM | POA: Diagnosis not present

## 2015-06-01 DIAGNOSIS — E46 Unspecified protein-calorie malnutrition: Secondary | ICD-10-CM | POA: Diagnosis present

## 2015-06-01 DIAGNOSIS — K5900 Constipation, unspecified: Secondary | ICD-10-CM | POA: Diagnosis not present

## 2015-06-01 DIAGNOSIS — E860 Dehydration: Secondary | ICD-10-CM | POA: Diagnosis present

## 2015-06-01 DIAGNOSIS — R509 Fever, unspecified: Secondary | ICD-10-CM | POA: Insufficient documentation

## 2015-06-01 DIAGNOSIS — I10 Essential (primary) hypertension: Secondary | ICD-10-CM | POA: Diagnosis present

## 2015-06-01 DIAGNOSIS — E8809 Other disorders of plasma-protein metabolism, not elsewhere classified: Secondary | ICD-10-CM | POA: Diagnosis not present

## 2015-06-01 DIAGNOSIS — E875 Hyperkalemia: Secondary | ICD-10-CM | POA: Diagnosis present

## 2015-06-01 DIAGNOSIS — E785 Hyperlipidemia, unspecified: Secondary | ICD-10-CM

## 2015-06-01 DIAGNOSIS — K208 Other esophagitis: Secondary | ICD-10-CM | POA: Diagnosis not present

## 2015-06-01 DIAGNOSIS — N179 Acute kidney failure, unspecified: Secondary | ICD-10-CM | POA: Diagnosis not present

## 2015-06-01 DIAGNOSIS — T451X5A Adverse effect of antineoplastic and immunosuppressive drugs, initial encounter: Secondary | ICD-10-CM

## 2015-06-01 DIAGNOSIS — Z79899 Other long term (current) drug therapy: Secondary | ICD-10-CM | POA: Diagnosis not present

## 2015-06-01 DIAGNOSIS — D630 Anemia in neoplastic disease: Secondary | ICD-10-CM | POA: Diagnosis present

## 2015-06-01 DIAGNOSIS — R1110 Vomiting, unspecified: Secondary | ICD-10-CM

## 2015-06-01 DIAGNOSIS — D696 Thrombocytopenia, unspecified: Secondary | ICD-10-CM | POA: Diagnosis not present

## 2015-06-01 DIAGNOSIS — I120 Hypertensive chronic kidney disease with stage 5 chronic kidney disease or end stage renal disease: Secondary | ICD-10-CM | POA: Diagnosis present

## 2015-06-01 DIAGNOSIS — N178 Other acute kidney failure: Secondary | ICD-10-CM

## 2015-06-01 DIAGNOSIS — R110 Nausea: Secondary | ICD-10-CM | POA: Diagnosis not present

## 2015-06-01 DIAGNOSIS — F419 Anxiety disorder, unspecified: Secondary | ICD-10-CM | POA: Diagnosis present

## 2015-06-01 DIAGNOSIS — Z86718 Personal history of other venous thrombosis and embolism: Secondary | ICD-10-CM

## 2015-06-01 DIAGNOSIS — E872 Acidosis: Secondary | ICD-10-CM | POA: Diagnosis present

## 2015-06-01 DIAGNOSIS — Z9484 Stem cells transplant status: Secondary | ICD-10-CM | POA: Diagnosis not present

## 2015-06-01 DIAGNOSIS — N186 End stage renal disease: Secondary | ICD-10-CM | POA: Insufficient documentation

## 2015-06-01 DIAGNOSIS — E1122 Type 2 diabetes mellitus with diabetic chronic kidney disease: Secondary | ICD-10-CM | POA: Diagnosis present

## 2015-06-01 DIAGNOSIS — R1310 Dysphagia, unspecified: Secondary | ICD-10-CM | POA: Diagnosis not present

## 2015-06-01 DIAGNOSIS — N189 Chronic kidney disease, unspecified: Secondary | ICD-10-CM | POA: Diagnosis present

## 2015-06-01 DIAGNOSIS — D62 Acute posthemorrhagic anemia: Secondary | ICD-10-CM | POA: Diagnosis not present

## 2015-06-01 DIAGNOSIS — R1314 Dysphagia, pharyngoesophageal phase: Secondary | ICD-10-CM | POA: Diagnosis not present

## 2015-06-01 DIAGNOSIS — Z9221 Personal history of antineoplastic chemotherapy: Secondary | ICD-10-CM

## 2015-06-01 DIAGNOSIS — E876 Hypokalemia: Secondary | ICD-10-CM | POA: Diagnosis not present

## 2015-06-01 DIAGNOSIS — R410 Disorientation, unspecified: Secondary | ICD-10-CM | POA: Diagnosis not present

## 2015-06-01 DIAGNOSIS — R7989 Other specified abnormal findings of blood chemistry: Secondary | ICD-10-CM

## 2015-06-01 DIAGNOSIS — E43 Unspecified severe protein-calorie malnutrition: Secondary | ICD-10-CM | POA: Diagnosis not present

## 2015-06-01 DIAGNOSIS — N185 Chronic kidney disease, stage 5: Secondary | ICD-10-CM | POA: Diagnosis not present

## 2015-06-01 DIAGNOSIS — Z833 Family history of diabetes mellitus: Secondary | ICD-10-CM | POA: Diagnosis not present

## 2015-06-01 DIAGNOSIS — R4182 Altered mental status, unspecified: Secondary | ICD-10-CM | POA: Diagnosis present

## 2015-06-01 DIAGNOSIS — Y838 Other surgical procedures as the cause of abnormal reaction of the patient, or of later complication, without mention of misadventure at the time of the procedure: Secondary | ICD-10-CM | POA: Diagnosis present

## 2015-06-01 DIAGNOSIS — I472 Ventricular tachycardia: Secondary | ICD-10-CM | POA: Diagnosis not present

## 2015-06-01 DIAGNOSIS — K920 Hematemesis: Secondary | ICD-10-CM | POA: Diagnosis not present

## 2015-06-01 DIAGNOSIS — Z86711 Personal history of pulmonary embolism: Secondary | ICD-10-CM

## 2015-06-01 DIAGNOSIS — K922 Gastrointestinal hemorrhage, unspecified: Secondary | ICD-10-CM | POA: Diagnosis not present

## 2015-06-01 DIAGNOSIS — T865 Complications of stem cell transplant: Secondary | ICD-10-CM | POA: Diagnosis present

## 2015-06-01 DIAGNOSIS — R0789 Other chest pain: Secondary | ICD-10-CM | POA: Diagnosis not present

## 2015-06-01 DIAGNOSIS — T8600 Unspecified complication of bone marrow transplant: Secondary | ICD-10-CM | POA: Diagnosis present

## 2015-06-01 DIAGNOSIS — R079 Chest pain, unspecified: Secondary | ICD-10-CM | POA: Insufficient documentation

## 2015-06-01 LAB — URINE MICROSCOPIC-ADD ON

## 2015-06-01 LAB — COMPREHENSIVE METABOLIC PANEL
ALBUMIN: 2.7 g/dL — AB (ref 3.5–5.0)
ALK PHOS: 49 U/L (ref 40–150)
ALT: 9 U/L (ref 0–55)
AST: 30 U/L (ref 5–34)
Anion Gap: 15 mEq/L — ABNORMAL HIGH (ref 3–11)
BUN: 49.7 mg/dL — AB (ref 7.0–26.0)
CALCIUM: 8.6 mg/dL (ref 8.4–10.4)
CHLORIDE: 98 meq/L (ref 98–109)
CO2: 22 mEq/L (ref 22–29)
CREATININE: 10.6 mg/dL — AB (ref 0.7–1.3)
EGFR: 5 mL/min/{1.73_m2} — ABNORMAL LOW (ref >90)
GLUCOSE: 94 mg/dL (ref 70–140)
Potassium: 3.9 mEq/L (ref 3.5–5.1)
SODIUM: 134 meq/L — AB (ref 136–145)
Total Bilirubin: 1.35 mg/dL — ABNORMAL HIGH (ref 0.20–1.20)
Total Protein: 7.2 g/dL (ref 6.4–8.3)

## 2015-06-01 LAB — URINALYSIS, ROUTINE W REFLEX MICROSCOPIC
BILIRUBIN URINE: NEGATIVE
Glucose, UA: NEGATIVE mg/dL
KETONES UR: NEGATIVE mg/dL
LEUKOCYTES UA: NEGATIVE
NITRITE: NEGATIVE
PROTEIN: 30 mg/dL — AB
Specific Gravity, Urine: 1.008 (ref 1.005–1.030)
pH: 6 (ref 5.0–8.0)

## 2015-06-01 LAB — CBC
HEMATOCRIT: 24.4 % — AB (ref 39.0–52.0)
Hemoglobin: 8.1 g/dL — ABNORMAL LOW (ref 13.0–17.0)
MCH: 26.3 pg (ref 26.0–34.0)
MCHC: 33.2 g/dL (ref 30.0–36.0)
MCV: 79.2 fL (ref 78.0–100.0)
Platelets: 101 10*3/uL — ABNORMAL LOW (ref 150–400)
RBC: 3.08 MIL/uL — ABNORMAL LOW (ref 4.22–5.81)
RDW: 16 % — AB (ref 11.5–15.5)
WBC: 2.7 10*3/uL — ABNORMAL LOW (ref 4.0–10.5)

## 2015-06-01 LAB — URIC ACID: URIC ACID, SERUM: 9.2 mg/dL — AB (ref 4.4–7.6)

## 2015-06-01 LAB — HEPARIN LEVEL (UNFRACTIONATED): Heparin Unfractionated: 0.3 IU/mL (ref 0.30–0.70)

## 2015-06-01 LAB — APTT
APTT: 29 s (ref 24–37)
aPTT: 81 seconds — ABNORMAL HIGH (ref 24–37)

## 2015-06-01 LAB — CK: CK TOTAL: 45 U/L — AB (ref 49–397)

## 2015-06-01 LAB — GLUCOSE, CAPILLARY
GLUCOSE-CAPILLARY: 259 mg/dL — AB (ref 65–99)
GLUCOSE-CAPILLARY: 383 mg/dL — AB (ref 65–99)

## 2015-06-01 MED ORDER — OXYCODONE HCL 5 MG PO TABS
5.0000 mg | ORAL_TABLET | ORAL | Status: DC | PRN
  Administered 2015-06-08: 5 mg via ORAL
  Filled 2015-06-01 (×2): qty 1

## 2015-06-01 MED ORDER — ONDANSETRON HCL 4 MG PO TABS
4.0000 mg | ORAL_TABLET | Freq: Four times a day (QID) | ORAL | Status: DC | PRN
  Administered 2015-06-07: 4 mg via ORAL
  Filled 2015-06-01: qty 1

## 2015-06-01 MED ORDER — INSULIN ASPART 100 UNIT/ML ~~LOC~~ SOLN
0.0000 [IU] | Freq: Every day | SUBCUTANEOUS | Status: DC
  Administered 2015-06-01: 5 [IU] via SUBCUTANEOUS
  Administered 2015-06-02: 2 [IU] via SUBCUTANEOUS
  Administered 2015-06-03: 3 [IU] via SUBCUTANEOUS
  Administered 2015-06-08 – 2015-06-10 (×2): 2 [IU] via SUBCUTANEOUS

## 2015-06-01 MED ORDER — SODIUM CHLORIDE 0.9 % IJ SOLN
10.0000 mL | INTRAMUSCULAR | Status: DC | PRN
  Administered 2015-06-01: 10 mL
  Filled 2015-06-01: qty 10

## 2015-06-01 MED ORDER — SODIUM CHLORIDE 0.9 % IV SOLN
INTRAVENOUS | Status: DC
  Administered 2015-06-01 – 2015-06-04 (×7): via INTRAVENOUS
  Administered 2015-06-04: 125 mL via INTRAVENOUS
  Administered 2015-06-05: 23:00:00 via INTRAVENOUS

## 2015-06-01 MED ORDER — HEPARIN (PORCINE) IN NACL 100-0.45 UNIT/ML-% IJ SOLN
1000.0000 [IU]/h | INTRAMUSCULAR | Status: DC
  Administered 2015-06-01: 1100 [IU]/h via INTRAVENOUS
  Filled 2015-06-01: qty 250

## 2015-06-01 MED ORDER — ISOSORBIDE MONONITRATE ER 30 MG PO TB24
30.0000 mg | ORAL_TABLET | Freq: Every day | ORAL | Status: DC
  Administered 2015-06-01 – 2015-06-09 (×8): 30 mg via ORAL
  Filled 2015-06-01 (×8): qty 1

## 2015-06-01 MED ORDER — ACETAMINOPHEN 325 MG PO TABS
650.0000 mg | ORAL_TABLET | Freq: Four times a day (QID) | ORAL | Status: DC | PRN
Start: 2015-06-01 — End: 2015-06-15
  Administered 2015-06-03 – 2015-06-12 (×3): 650 mg via ORAL
  Filled 2015-06-01 (×3): qty 2

## 2015-06-01 MED ORDER — SODIUM CHLORIDE 0.9 % IV BOLUS (SEPSIS)
1000.0000 mL | Freq: Once | INTRAVENOUS | Status: AC
  Administered 2015-06-01: 1000 mL via INTRAVENOUS

## 2015-06-01 MED ORDER — INSULIN ASPART 100 UNIT/ML ~~LOC~~ SOLN
0.0000 [IU] | Freq: Three times a day (TID) | SUBCUTANEOUS | Status: DC
  Administered 2015-06-02 (×3): 5 [IU] via SUBCUTANEOUS
  Administered 2015-06-03: 3 [IU] via SUBCUTANEOUS
  Administered 2015-06-03: 2 [IU] via SUBCUTANEOUS
  Administered 2015-06-03: 11 [IU] via SUBCUTANEOUS
  Administered 2015-06-03: 3 [IU] via SUBCUTANEOUS
  Administered 2015-06-04 (×2): 5 [IU] via SUBCUTANEOUS
  Administered 2015-06-07: 2 [IU] via SUBCUTANEOUS
  Administered 2015-06-07 – 2015-06-08 (×2): 3 [IU] via SUBCUTANEOUS
  Administered 2015-06-09: 5 [IU] via SUBCUTANEOUS
  Administered 2015-06-10: 2 [IU] via SUBCUTANEOUS
  Administered 2015-06-10: 3 [IU] via SUBCUTANEOUS
  Administered 2015-06-10: 5 [IU] via SUBCUTANEOUS
  Administered 2015-06-11 (×3): 3 [IU] via SUBCUTANEOUS
  Administered 2015-06-12: 2 [IU] via SUBCUTANEOUS
  Administered 2015-06-13 (×2): 3 [IU] via SUBCUTANEOUS
  Administered 2015-06-13 – 2015-06-14 (×2): 2 [IU] via SUBCUTANEOUS
  Administered 2015-06-14: 5 [IU] via SUBCUTANEOUS
  Administered 2015-06-15: 3 [IU] via SUBCUTANEOUS
  Administered 2015-06-15: 11 [IU] via SUBCUTANEOUS

## 2015-06-01 MED ORDER — ONDANSETRON HCL 4 MG/2ML IJ SOLN
4.0000 mg | Freq: Four times a day (QID) | INTRAMUSCULAR | Status: DC | PRN
  Administered 2015-06-09 – 2015-06-10 (×6): 4 mg via INTRAVENOUS
  Filled 2015-06-01 (×6): qty 2

## 2015-06-01 MED ORDER — SODIUM CHLORIDE 0.9% FLUSH
3.0000 mL | Freq: Two times a day (BID) | INTRAVENOUS | Status: DC
Start: 2015-06-01 — End: 2015-06-15
  Administered 2015-06-03 – 2015-06-14 (×6): 3 mL via INTRAVENOUS

## 2015-06-01 MED ORDER — ENSURE ENLIVE PO LIQD
237.0000 mL | Freq: Two times a day (BID) | ORAL | Status: DC
  Administered 2015-06-01 – 2015-06-15 (×13): 237 mL via ORAL

## 2015-06-01 MED ORDER — SODIUM CHLORIDE 0.9% FLUSH
10.0000 mL | INTRAVENOUS | Status: DC | PRN
Start: 2015-06-01 — End: 2015-06-15
  Administered 2015-06-02 – 2015-06-15 (×8): 10 mL
  Filled 2015-06-01 (×8): qty 40

## 2015-06-01 MED ORDER — ACETAMINOPHEN 650 MG RE SUPP: 650.0000 mg | Freq: Four times a day (QID) | RECTAL | Status: DC | PRN

## 2015-06-01 MED ORDER — MORPHINE SULFATE (PF) 2 MG/ML IV SOLN: 2.0000 mg | INTRAVENOUS | Status: DC | PRN

## 2015-06-01 NOTE — Assessment & Plan Note (Signed)
Patient appears mildly dehydrated today; with sodium down to 134.  However, patient's creatinine has drastically increased to 10.6 today.  Patient will be admitted for further evaluation and management today.  See further notes for details.

## 2015-06-01 NOTE — Telephone Encounter (Signed)
Eddie Thomas notified of abnormal creatinine at 0855. Eddie in to see pt and coordinate admission.

## 2015-06-01 NOTE — H&P (Signed)
History and Physical    Eddie Thomas:[id number] DOB: 11-03-1945 DOA: 06/01/2015  Referring MD/NP/PA:  PCP: [name]  Outpatient Specialists: Dr. Inda Merlin of medical oncology Patient coming from: [place]  Chief Complaint: Generalized weakness  HPI: Eddie Thomas is a 70 y.o. male with medical history significant of multiple myeloma, IgA subtype diagnosed in 201, currently undergoing treatment with Velcade/Daratumumab at the cancer center, who follows Dr. Julien Nordmann of medical oncology, also having a history of chronic kidney disease (with baseline creatinine near 1.6-2.0), hypertension, diabetes mellitus, presented as a transfer from the cancer center. He presented to the cancer today for follow-up visit, where he had reported having generalized weakness, fatigue, or tolerance to physical exertion, minimal by mouth intake. He attributes minimal by mouth intake to having "bad taste" from chemotherapy. He has also noted decreased urinary output the last several weeks. He denies shortness of breath, chest pain, fevers, chills, chest pain, abdominal pain, dysuria, diarrhea, nausea, vomiting. Lab work performed in the cancer center revealing a creatinine of 10.6 with BUN of 49.7. His potassium was 3.9 and bicarbonate 22. He was referred to the inpatient service for further workup and evaluation of his renal failure.   Review of Systems: As per HPI otherwise 10 point review of systems negative.   Past Medical History  Diagnosis Date  . Diabetes mellitus ([employer]) 07/03/2011  . Hypertension 07/03/2011  . Hyperlipidemia 07/03/2011  . Colon polyps 2012  . Gastric ulcer   . Bulging lumbar disc   . Encounter for antineoplastic chemotherapy 03/05/2015  . Anemia   . Blood transfusion without reported diagnosis 2015  . Multiple myeloma 2011    currently undergoing chemo  . Cataract     Past Surgical History  Procedure Laterality Date  . Limbal stem cell transplant    .  Humerus fracture surgery  2011    right  . 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  . Cataract extraction w/ intraocular lens implant Bilateral left 3/17, right 4/17     reports that he has never smoked. He has never used smokeless tobacco. He reports that he does not drink alcohol or use illicit drugs.  No Known Allergies  Family History  Problem Relation Age of Onset  . Diabetes Mother   . Hyperlipidemia Mother   . Diabetes Sister   . Diabetes Brother   . Colon cancer Maternal Uncle 80    Prior to Admission medications   Medication Sig Start Date End Date Taking? Authorizing Provider  Cholecalciferol (VITAMIN D-3) 5000 UNITS TABS Take 1 tablet by mouth daily.   Yes Historical Provider, MD  fluconazole (DIFLUCAN) 100 MG tablet Take 1 tablet (100 mg total) by mouth daily. 05/30/15  Yes Curt Bears, MD  gabapentin (NEURONTIN) 300 MG capsule Take 300 mg by mouth 3 (three) times daily as needed (pain). Reported on 04/25/2015   Yes Historical Provider, MD  lidocaine-prilocaine (EMLA) cream Apply 1 application topically as needed. Apply one hour before use and cover with plastic wrap. 10/17/14  Yes Adrena E Johnson, PA-C  lisinopril-hydrochlorothiazide (PRINZIDE,ZESTORETIC) 20-12.5 MG tablet Take 1 tablet by mouth daily.    Yes Historical Provider, MD  oxyCODONE-acetaminophen (PERCOCET/ROXICET) 5-325 MG tablet Take 1 tablet by mouth every 6 (six) hours as needed for severe pain. 01/24/15  Yes Curt Bears, MD  pantoprazole (PROTONIX) 40 MG tablet TAKE ONE TABLET BY MOUTH TWICE DAILY 02/02/15  Yes Ladene Artist, MD  prochlorperazine (COMPAZINE) 10 MG  tablet Take 10 mg by mouth every 6 (six) hours as needed for nausea or vomiting. Reported on 05/14/2015   Yes Historical Provider, MD  simvastatin (ZOCOR) 10 MG tablet Take 10 mg by mouth at bedtime.     Yes Historical Provider, MD  temazepam (RESTORIL) 15 MG capsule Take 15 mg by mouth at bedtime as needed for sleep. Reported  on 01/16/2015   Yes Historical Provider, MD  valACYclovir (VALTREX) 500 MG tablet TAKE ONE CAPLET BY MOUTH ONCE DAILY 01/24/15  Yes Curt Bears, MD  VIAGRA 50 MG tablet Take 50 mg by mouth daily as needed for erectile dysfunction.  02/23/13  Yes Historical Provider, MD  XARELTO 20 MG TABS tablet TAKE ONE TABLET BY MOUTH ONCE DAILY WITH  SUPPER 05/04/15  Yes Curt Bears, MD  dexamethasone (DECADRON) 4 MG tablet 5 tab po on days 1,2,4,5,8,9,11 and 12 every 3 weeks, start first day of chemo. Patient not taking: Reported on 06/01/2015 04/18/15   Curt Bears, MD  montelukast (SINGULAIR) 10 MG tablet Take 10 mg by mouth. 04/18/15 04/17/16  Historical Provider, MD  pioglitazone (ACTOS) 15 MG tablet Take 15 mg by mouth daily. Reported on 05/16/2015 08/19/12   Historical Provider, MD    Physical Exam: Filed Vitals:   06/01/15 1229  BP: 158/84  Pulse: 88  Temp: 98 F (36.7 C)  TempSrc: Oral  Resp: 18  SpO2: 100%      Constitutional: Chronically ill-appearing, thin, no acute distress Filed Vitals:   06/01/15 1229  BP: 158/84  Pulse: 88  Temp: 98 F (36.7 C)  TempSrc: Oral  Resp: 18  SpO2: 100%   Eyes: PERRL, lids and conjunctivae normal ENMT: Mucous membranes are moist. Posterior pharynx clear of any exudate or lesions.Normal dentition. Dry oral mucosa  Neck: normal, supple, no masses, no thyromegaly Respiratory: clear to auscultation bilaterally, no wheezing, no crackles. Normal respiratory effort. No accessory muscle use.  Cardiovascular: Regular rate and rhythm, no murmurs / rubs / gallops. No extremity edema. 2+ pedal pulses. No carotid bruits.  Abdomen: no tenderness, no masses palpated. No hepatosplenomegaly. Bowel sounds positive.  Musculoskeletal: no clubbing / cyanosis. No joint deformity upper and lower extremities. Good ROM, no contractures. Normal muscle tone.  Skin: no rashes, lesions, ulcers. No induration Neurologic: CN 2-12 grossly intact. Sensation intact, DTR  normal. Strength 5/5 in all 4.  Psychiatric: Normal judgment and insight. Alert and oriented x 3. Normal mood.    Labs on Admission: I have personally reviewed following labs and imaging studies  CBC:  Recent Labs Lab 05/28/15 0849  WBC 2.1*  NEUTROABS 1.9  HGB 8.5*  HCT 26.8*  MCV 82.5  PLT 79*   Basic Metabolic Panel:  Recent Labs Lab 05/28/15 0849 06/01/15 0744  NA 139 134*  K 3.7 3.9  CO2 28 22  GLUCOSE 133 94  BUN 25.1 49.7*  CREATININE 3.3 Repeated and Verified* 10.6*  CALCIUM 10.0 8.6   GFR: CrCl cannot be calculated (Unknown ideal weight.). Liver Function Tests:  Recent Labs Lab 05/28/15 0849 06/01/15 0744  AST 25 30  ALT 11 9  ALKPHOS 48 49  BILITOT 1.39* 1.35*  PROT 7.7 7.2  ALBUMIN 2.8* 2.7*   No results for input(s): LIPASE, AMYLASE in the last 168 hours. No results for input(s): AMMONIA in the last 168 hours. Coagulation Profile: No results for input(s): INR, PROTIME in the last 168 hours. Cardiac Enzymes: No results for input(s): CKTOTAL, CKMB, CKMBINDEX, TROPONINI in the last 168 hours. BNP (  last 3 results) No results for input(s): PROBNP in the last 8760 hours. HbA1C: No results for input(s): HGBA1C in the last 72 hours. CBG: No results for input(s): GLUCAP in the last 168 hours. Lipid Profile: No results for input(s): CHOL, HDL, LDLCALC, TRIG, CHOLHDL, LDLDIRECT in the last 72 hours. Thyroid Function Tests: No results for input(s): TSH, T4TOTAL, FREET4, T3FREE, THYROIDAB in the last 72 hours. Anemia Panel: No results for input(s): VITAMINB12, FOLATE, FERRITIN, TIBC, IRON, RETICCTPCT in the last 72 hours. Urine analysis:    Component Value Date/Time   COLORURINE YELLOW 01/16/2012 1902   APPEARANCEUR CLEAR 01/16/2012 1902   LABSPEC 1.012 01/16/2012 1902   PHURINE 8.5* 01/16/2012 1902   GLUCOSEU NEGATIVE 01/16/2012 1902   HGBUR NEGATIVE 01/16/2012 1902   BILIRUBINUR NEGATIVE 01/16/2012 1902   KETONESUR TRACE* 01/16/2012 1902    PROTEINUR NEGATIVE 01/16/2012 1902   UROBILINOGEN 1.0 01/16/2012 1902   NITRITE NEGATIVE 01/16/2012 1902   LEUKOCYTESUR NEGATIVE 01/16/2012 1902   Sepsis Labs: @LABRCNTIP (procalcitonin:4,lacticidven:4) )No results found for this or any previous visit (from the past 240 hour(s)).   Radiological Exams on Admission: No results found.  EKG: Independently reviewed.   Assessment/Plan Principal Problem:   Acute on chronic renal failure (HCC) Active Problems:   Multiple myeloma (HCC)   Dehydration   Hypertension   Bone marrow transplant complication (HCC)   Hypoalbuminemia due to protein-calorie malnutrition (HCC)   Antineoplastic chemotherapy induced pancytopenia ([place])   Long term current use of anticoagulant therapy   Acute renal failure (ARF) ([place])  1.  Acute on chronic renal failure. Mr Castilleja having a history of multiple myeloma as well as stage III chronic kidney disease baseline creatinine near 2.0. He comes in after lab work today revealed a creatinine of 10.6 with BUN of 49.7. Potassium stable at 3.9 and bicarbonate 22. He had been taking Valtrex along with lisinopril/hydrochlorothiazide which may have contributed to his renal failure. Furthermore he reports that he has had minimal by mouth intake with associated decreased urinary output over the last several weeks make an argument for dehydration/hypovolemia. Renal failure may also be due to multiple myeloma. Will provide liter bolus of normal saline and run maintenance fluids, place Foley catheter, check urinalysis, obtain bilateral renal ultrasound. Case discussed with nephrology who will consult. I spoke with Dr.Webb. Will discontinue lisinopril/hydrochlorothiazide and Valtrex. Follow repeat labs in a.m. await further recommendations from nephrology.  2.  Dehydration. Mr Bellanca reporting minimal by mouth intake over the past week which he attributes to having altered taste from chemotherapy. Will provide IV fluid resuscitation,  protein supplementation, repeat labs in a.m.  3.  History of multiple myeloma. He is currently receiving his care at the cancer center and follows Dr. Julien Nordmann of medical oncology. He is currently being treated with Velcade and Daratumumab. Medical oncology aware of patient's hospitalization.  4.  History of DVT. He had been anticoagulated with Xarelto, which will be discontinued now due to significant elevation in creatinine. I spoke with pharmacy, will place him on IV heparin likely tomorrow.  5.  Pancytopenia secondary to chemotherapy. Lab work performed on 05/28/2015 showing improvement to his platelet count at 79,000, previously 22,000. White count also improved to 2100, previously 1.4. Will repeat CBC at this time.  6.  Hypertension. His lisinopril will be discontinued secondary to renal failure. Replace with Imdur 30 mg PO q daily. Monitor blood pressures.  7.  Protein calorie malnutrition. Secondary to malignancy, provide protein boost  8.  History of diabetes mellitus.  Provide Accu-Cheks with sliding scale coverage   DVT prophylaxis: He is fully anticoagulated Code Status: Full code Family Communication: Family not present Disposition Plan: Anticipate he'll require greater than 2 night hospitalization Consults called: Dr. Justin Mend of nephrology Admission status: Admit to inpatient service   Kelvin Cellar MD [facility] Pager [phone]  If 7PM-7AM, please contact night-coverage [email or web address] Password [facility]  06/01/2015, 12:39 PM

## 2015-06-01 NOTE — Assessment & Plan Note (Signed)
Patient last received chemotherapy on 05/18/2015.  Blood counts obtained today revealed a WBC of 2.1, ANC 1.9, hemoglobin has dropped from 9.0, down to 8.5, amplitude count has improved from 22 up to 79.  Patient denies any issues with either easy bleeding or bruising at the present time.

## 2015-06-01 NOTE — Assessment & Plan Note (Signed)
Patient with chronically elevated bilirubin at 1.35 today.  We'll continue to monitor.

## 2015-06-01 NOTE — Assessment & Plan Note (Signed)
Albumin remains low at 2.7.  Patient was advised to push protein in his diet is much as possible.

## 2015-06-01 NOTE — Assessment & Plan Note (Signed)
Patient has a history of DVT in the past; and continues to take Xarelto as directed.

## 2015-06-01 NOTE — Assessment & Plan Note (Signed)
Patient has history of chronic renal insufficiency with baseline creatinine about 1.6.  However, patient's creatinine has now increased to 10.6.  Differential diagnosis for acute renal failure should include renal failure secondary to the multiple myeloma versus side effect of patient's chemotherapy versus immunotherapy.  Brief history.  Report were called to Dr. Coralyn Pear hospitalist; prior to the patient being admitted to the telemetry bed.  Also, this provider called report to the telemetry floor nurse as well.  Patient was transported to room 1422, the a wheelchair.  Per the cancer Center nurse.  Of note: Patient should be considered a full code; since patient has no advanced directives in his chart.

## 2015-06-01 NOTE — Progress Notes (Signed)
Utilization Review completed.  Livia Snellen RN CM  Completed in [place]

## 2015-06-01 NOTE — Patient Instructions (Signed)

## 2015-06-01 NOTE — Progress Notes (Signed)
Spiritual Care Note  Received referral from [facility]/RD for emotional support as pt was admitted.  Rush [facility]mark was occupied receiving care on the floor, so I briefly conveyed good wishes from [employer].  Will continue to follow, but please also page as needs arise/circumstances change.  Thank you.  [place], [place], [facility] M-F daytime pager [phone] [facility] 24/7 pager [phone]

## 2015-06-01 NOTE — Progress Notes (Signed)
Eddie Lesser, NP notified of pt's elevated CRT level.  Treatment will be held today and pt will be taken to the ED. 1024:  Pt direct admitted to room #1422.  Transported via wheelchair by Cherre Huger, RN

## 2015-06-01 NOTE — Telephone Encounter (Signed)
-----   Message from Curt Bears, MD sent at 06/01/2015  9:05 AM EDT ----- Regarding: FW: He needs to be admitted for further evaluation by nephrology. ----- Message -----    From: Lab in [facility] [place] [facility] Interface    Sent: 06/01/2015   8:55 AM      To: Curt Bears, MD

## 2015-06-01 NOTE — Progress Notes (Addendum)
ANTICOAGULATION CONSULT NOTE - Initial Consult  Pharmacy Consult for Heparin Indication: Hx of DVT (rivaroxaban held for renal failure)  No Known Allergies  Patient Measurements: Height: 5' 11" (180.3 cm) Weight: 155 lb 6.8 oz (70.5 kg) IBW/kg (Calculated) : 75.3 Heparin Dosing Weight: actual weight  Vital Signs: Temp: 98 F (36.7 C) (05/12 1229) Temp Source: Oral (05/12 1229) BP: 158/84 mmHg (05/12 1229) Pulse Rate: 88 (05/12 1229)  Labs:  Recent Labs  06/01/15 0744  CREATININE 10.6*    Estimated Creatinine Clearance: 6.6 mL/min (by C-G formula based on Cr of 10.6).   Medical History: Past Medical History  Diagnosis Date  . Diabetes mellitus ([place]) 07/03/2011  . Hypertension 07/03/2011  . Hyperlipidemia 07/03/2011  . Colon polyps 2012  . Gastric ulcer   . Bulging lumbar disc   . Encounter for antineoplastic chemotherapy 03/05/2015  . Anemia   . Blood transfusion without reported diagnosis 2015  . Multiple myeloma 2011    currently undergoing chemo  . Cataract     Medications:  Prescriptions prior to admission  Medication Sig Dispense Refill Last Dose  . Cholecalciferol (VITAMIN D-3) 5000 UNITS TABS Take 1 tablet by mouth daily.   06/01/2015 at Unknown time  . fluconazole (DIFLUCAN) 100 MG tablet Take 1 tablet (100 mg total) by mouth daily. 7 tablet 0 06/01/2015 at Unknown time  . gabapentin (NEURONTIN) 300 MG capsule Take 300 mg by mouth 3 (three) times daily as needed (pain). Reported on 04/25/2015   unknown  . lidocaine-prilocaine (EMLA) cream Apply 1 application topically as needed. Apply one hour before use and cover with plastic wrap. 30 g 1 06/01/2015 at Unknown time  . lisinopril-hydrochlorothiazide (PRINZIDE,ZESTORETIC) 20-12.5 MG tablet Take 1 tablet by mouth daily.    05/31/2015 at Unknown time  . oxyCODONE-acetaminophen (PERCOCET/ROXICET) 5-325 MG tablet Take 1 tablet by mouth every 6 (six) hours as needed for severe pain. 30 tablet 0 unknown  . pantoprazole  (PROTONIX) 40 MG tablet TAKE ONE TABLET BY MOUTH TWICE DAILY 60 tablet 11 06/01/2015 at Unknown time  . prochlorperazine (COMPAZINE) 10 MG tablet Take 10 mg by mouth every 6 (six) hours as needed for nausea or vomiting. Reported on 05/14/2015   unknown  . simvastatin (ZOCOR) 10 MG tablet Take 10 mg by mouth at bedtime.     05/31/2015 at Unknown time  . temazepam (RESTORIL) 15 MG capsule Take 15 mg by mouth at bedtime as needed for sleep. Reported on 01/16/2015   unknown  . valACYclovir (VALTREX) 500 MG tablet TAKE ONE CAPLET BY MOUTH ONCE DAILY 30 tablet 3 06/01/2015 at Unknown time  . VIAGRA 50 MG tablet Take 50 mg by mouth daily as needed for erectile dysfunction.    unknown  . XARELTO 20 MG TABS tablet TAKE ONE TABLET BY MOUTH ONCE DAILY WITH  SUPPER 30 tablet 0 05/31/2015 at 2330  . dexamethasone (DECADRON) 4 MG tablet 5 tab po on days 1,2,4,5,8,9,11 and 12 every 3 weeks, start first day of chemo. (Patient not taking: Reported on 06/01/2015) 40 tablet 4 Not Taking at Unknown time  . montelukast (SINGULAIR) 10 MG tablet Take 10 mg by mouth.   05/15/2015  . pioglitazone (ACTOS) 15 MG tablet Take 15 mg by mouth daily. Reported on 05/16/2015   Not Taking    Assessment: 56 yoM admitted on 5/12 with acute on CKD (baseline SCr 1.6, currently 10.6).  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  Rivaroxaban is  held for acute renal failure and Pharmacy is consulted to dose Heparin.  PTA Xarelto 79m daily.  Last dose taken on 5/10 at unknown time (originally reported as last dose on 5/11 at 2330)  Today, 06/01/2015:  SCr 10.6 with CrCl ~ 6 ml/min.  CBC: Hgb 8.5 and Plt 79.  Note chronic anemia (baseline Hgb 8-10) and thrombocytopenia (baseline Plt < 100, as low as 22 on 05/21/15)  No bleeding or complications reported.  Baseline coags: aPTT 29, HL < 0.1   Goal of Therapy:  Heparin level 0.3-0.7 units/ml aPTT 66-102  seconds Monitor platelets by anticoagulation protocol: Yes   Plan:   No heparin bolus  Start heparin IV infusion at 1100 units/hr  (>24 hours after last Xarelto dose)  Heparin level 8 hours after starting  Daily heparin level and CBC.  Monitor for s/s bleeding.  CGretta ArabPharmD, BCPS Pager [phone]/12/2015 11:26 AM

## 2015-06-01 NOTE — Progress Notes (Signed)
ANTICOAGULATION CONSULT NOTE - Follow Up Consult  Pharmacy Consult for Heparin Indication: Hx of DVT (rivaroxaban held for renal failure)  No Known Allergies  Patient Measurements: Height: 5\' 11"  (180.3 cm) Weight: 155 lb 6.8 oz (70.5 kg) IBW/kg (Calculated) : 75.3 Heparin Dosing Weight:   Vital Signs: Temp: 98.3 F (36.8 C) (05/12 2135) Temp Source: Oral (05/12 2135) BP: 147/84 mmHg (05/12 2135) Pulse Rate: 72 (05/12 2135)  Labs:  Recent Labs  06/01/15 0744 06/01/15 1230 06/01/15 1405 06/01/15 2200  HGB  --   --  8.1*  --   HCT  --   --  24.4*  --   PLT  --   --  101*  --   APTT  --   --  29 81*  HEPARINUNFRC  --   --  <0.10* 0.30  CREATININE 10.6*  --   --   --   CKTOTAL  --  45*  --   --     Estimated Creatinine Clearance: 6.6 mL/min (by C-G formula based on Cr of 10.6).   Medications:  Infusions:  . sodium chloride 125 mL/hr at 06/01/15 1648  . heparin 1,200 Units/hr (06/01/15 2313)    Assessment: Patient with heparin level at goal but lowest level of goal.  PTT ordered with Heparin level until both correlate due to possible drug-lab interaction between oral anticoagulant (rivaroxaban, edoxaban, or apixaban) and anti-Xa level (aka heparin level).  The PTT/HL correlate and therefore will only use heparin level in future.  No heparin issues per RN.  Goal of Therapy:  Heparin level 0.3-0.7 units/ml Monitor platelets by anticoagulation protocol: Yes   Plan:  Increase heparin to 1200 units/hr Recheck heparin level at 0800  Tyler Deis, Shea Stakes [place] 06/01/2015,11:15 PM

## 2015-06-01 NOTE — Progress Notes (Signed)
Initial Nutrition Assessment  DOCUMENTATION CODES:   Non-severe (moderate) malnutrition in context of chronic illness  INTERVENTION:  -Ensure Enlive po BID, each supplement provides 350 kcal and 20 grams of protein -RD to order snacks -RD to continue to monitor  NUTRITION DIAGNOSIS:   Malnutrition related to chronic illness as evidenced by moderate depletions of muscle mass, moderate depletion of body fat.  GOAL:   Patient will meet greater than or equal to 90% of their needs  MONITOR:   PO intake, I & O's, Labs, Supplement acceptance, Weight trends  REASON FOR ASSESSMENT:   Malnutrition Screening Tool    ASSESSMENT:   Eddie Thomas is a 70 y.o. male with medical history significant of multiple myeloma, IgA subtype diagnosed in 201, currently undergoing treatment with Velcade/Daratumumab at the cancer center, who follows Dr. Julien Nordmann of medical oncology, also having a history of chronic kidney disease (with baseline creatinine near 1.6-2.0), hypertension, diabetes mellitus, presented as a transfer from the cancer center  Spoke with Eddie Thomas at bedside. He endorses a 10-15# wt loss in 2 weeks related to poor PO intake as a result of taste changes. States "Food just doesn't taste like anything."  Patient was encouraged by Outpt Oncology RD to try rinsing mouth with baking soda/ warm salt water prior to meals for which he states that latter has not helped; he has not tried the former.  Encouraged pt to try lemon drops/peppermints prior to meals as well. Encouraged pt to get any PO intake he can. He has been drinking Ensure at home -> Will provide during stay. He also endorses forcing himself to eat at meals. He did state ice cream tastes good to him, will provide as HS snack.  Per chart, he present with a 7#/4.3% severe wt loss in 2 weeks.  Nutrition-Focused physical exam completed. Findings are moderate fat depletion, moderate muscle depletion, and no edema.   Pt received  a tray during my visit, will follow-up to determine PO intake and supplement acceptance.  Labs and Medications reviewed: Na 134, Cr 10.6, Bun 49.7    Diet Order:  Diet regular Room service appropriate?: Yes; Fluid consistency:: Thin  Skin:  Reviewed, no issues  Last BM:  PTA  Height:   Ht Readings from Last 1 Encounters:  06/01/15 5' 11"  (1.803 m)    Weight:   Wt Readings from Last 1 Encounters:  06/01/15 155 lb 6.8 oz (70.5 kg)    Ideal Body Weight:  78.18 kg  BMI:  Body mass index is 21.69 kg/(m^2).  Estimated Nutritional Needs:   Kcal:  1750-2100 calories  Protein:  85-105 grams  Fluid:  >/= 1.75L  EDUCATION NEEDS:   No education needs identified at this time  Eddie Thomas. Eddie Fogal, MS, RD LDN Inpatient Clinical Dietitian Pager [phone]

## 2015-06-01 NOTE — Assessment & Plan Note (Signed)
Patient presented to the [facility] today to receive his next Velcade/daratumumab chemotherapy regimen.  Patient states that he is feeling poorly today; with increased fatigue and some mild chills.  He denies any recent fever.  Labs obtained today revealed a WBC of 2.1, ANC 1.9, hemoglobin has dropped from 9.0, down to 8.5, and platelet count is improved from 22 up to 79.  Vital signs were all stable; with the exception of very low-grade temperature of 99.0.  Labs revealed acute renal failure with a creatinine of 10.6 today.  See further notes for details.  All chemotherapy will be held today and rescheduled for a later date.  Patient is now scheduled for labs, visit, flush, and next cycle of chemotherapy on 06/04/2015.

## 2015-06-01 NOTE — Progress Notes (Signed)
SYMPTOM MANAGEMENT CLINIC    Chief Complaint: Renal failure  HPI:  Eddie Thomas 70 y.o. male diagnosed with multiple myeloma.  Currently undergoing Velcade/daratumumab chemotherapy regimen.   Patient has history of chronic renal insufficiency with baseline creatinine about 1.6.  However, patient's creatinine has now increased to 10.6.  Differential diagnosis for acute renal failure should include renal failure secondary to the multiple myeloma versus side effect of patient's chemotherapy versus immunotherapy.  Brief history.  Report were called to Dr. Coralyn Pear hospitalist; prior to the patient being admitted to the telemetry bed.  Also, this provider called report to the telemetry floor nurse as well.  Patient was transported to room 1422, the a wheelchair.  Per the cancer Center nurse.  Of note: Patient should be considered a full code; since patient has no advanced directives in his chart.   No history exists.    Review of Systems  Constitutional: Positive for chills and malaise/fatigue. Negative for fever.  Neurological: Positive for weakness.  All other systems reviewed and are negative.   Past Medical History  Diagnosis Date  . Diabetes mellitus ([place]) 07/03/2011  . Hypertension 07/03/2011  . Hyperlipidemia 07/03/2011  . Colon polyps 2012  . Gastric ulcer   . Bulging lumbar disc   . Encounter for antineoplastic chemotherapy 03/05/2015  . Anemia   . Blood transfusion without reported diagnosis 2015  . Multiple myeloma 2011    currently undergoing chemo  . Cataract     Past Surgical History  Procedure Laterality Date  . Limbal stem cell transplant    . Humerus fracture surgery  2011    right  . 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  . Cataract extraction w/ intraocular lens implant Bilateral left 3/17, right 4/17    has Multiple myeloma ([place]); Diabetes mellitus ([place]); Hypertension; Hyperlipidemia; Dysphagia, pharyngoesophageal; Back pain; Bone  marrow transplant complication ([place]); Hyperbilirubinemia; Antineoplastic chemotherapy induced anemia; DVT (deep venous thrombosis) ([place]); Gastroesophageal reflux disease with esophagitis; Encounter for antineoplastic chemotherapy; Dehydration; Acute on chronic renal failure ([place]); Hypoalbuminemia due to protein-calorie malnutrition ([place]); Antineoplastic chemotherapy induced pancytopenia ([place]); and Long term current use of anticoagulant therapy on his problem list.    has No Known Allergies.    Medication List       This list is accurate as of: 06/01/15 10:28 AM.  Always use your most recent med list.               dexamethasone 4 MG tablet  Commonly known as:  DECADRON  5 tab po on days 1,2,4,5,8,9,11 and 12 every 3 weeks, start first day of chemo.     fluconazole 100 MG tablet  Commonly known as:  DIFLUCAN  Take 1 tablet (100 mg total) by mouth daily.     gabapentin 300 MG capsule  Commonly known as:  NEURONTIN  Take 300 mg by mouth 3 (three) times daily. Reported on 04/25/2015     lidocaine-prilocaine cream  Commonly known as:  EMLA  Apply 1 application topically as needed. Apply one hour before use and cover with plastic wrap.     lisinopril-hydrochlorothiazide 20-12.5 MG tablet  Commonly known as:  PRINZIDE,ZESTORETIC  Take by mouth.     montelukast 10 MG tablet  Commonly known as:  SINGULAIR  Take 10 mg by mouth.     oxyCODONE-acetaminophen 5-325 MG tablet  Commonly known as:  PERCOCET/ROXICET  Take 1 tablet by mouth every 6 (six) hours as needed for  severe pain.     pantoprazole 40 MG tablet  Commonly known as:  PROTONIX  TAKE ONE TABLET BY MOUTH TWICE DAILY     pioglitazone 15 MG tablet  Commonly known as:  ACTOS  Take 15 mg by mouth daily. Reported on 05/16/2015     prochlorperazine 10 MG tablet  Commonly known as:  COMPAZINE  Take 10 mg by mouth every 6 (six) hours as needed for nausea or vomiting. Reported on 05/14/2015     simvastatin 10 MG tablet    Commonly known as:  ZOCOR  Take 10 mg by mouth at bedtime.     temazepam 15 MG capsule  Commonly known as:  RESTORIL  Take 15 mg by mouth at bedtime as needed for sleep. Reported on 01/16/2015     valACYclovir 500 MG tablet  Commonly known as:  VALTREX  TAKE ONE CAPLET BY MOUTH ONCE DAILY     VIAGRA 50 MG tablet  Generic drug:  sildenafil  Take 50 mg by mouth daily as needed for erectile dysfunction.     Vitamin D-3 5000 UNITS Tabs  Take 1 tablet by mouth daily.     XARELTO 20 MG Tabs tablet  Generic drug:  rivaroxaban  TAKE ONE TABLET BY MOUTH ONCE DAILY WITH  SUPPER         PHYSICAL EXAMINATION  Oncology Vitals 06/01/2015 05/31/2015  Temp 99 -  Pulse 86 82  Resp 18 -  SpO2 100 100   BP Readings from Last 2 Encounters:  06/01/15 141/71  05/31/15 139/77    Physical Exam  Constitutional: He is oriented to person, place, and time. He appears dehydrated. He appears unhealthy.  HENT:  Head: Normocephalic and atraumatic.  Eyes: Conjunctivae and EOM are normal. Pupils are equal, round, and reactive to light. Right eye exhibits no discharge. Left eye exhibits no discharge. No scleral icterus.  Neck: Normal range of motion.  Pulmonary/Chest: Effort normal. No respiratory distress.  Musculoskeletal: Normal range of motion. He exhibits no edema or tenderness.  Neurological: He is alert and oriented to person, place, and time.  Skin: Skin is warm and dry. No rash noted. No erythema. There is pallor.  Psychiatric: Affect normal.  Nursing note and vitals reviewed.   LABORATORY DATA:. Appointment on 06/01/2015  Component Date Value Ref Range Status  . Sodium 06/01/2015 134* 136 - 145 mEq/L Final  . Potassium 06/01/2015 3.9  3.5 - 5.1 mEq/L Final  . Chloride 06/01/2015 98  98 - 109 mEq/L Final  . CO2 06/01/2015 22  22 - 29 mEq/L Final  . Glucose 06/01/2015 94  70 - 140 mg/dl Final   Glucose reference range is for nonfasting patients. Fasting glucose reference range is 70-  100.  [name] BUN 06/01/2015 49.7* 7.0 - 26.0 mg/dL Final  . Creatinine 06/01/2015 10.6* 0.7 - 1.3 mg/dL Final  . Total Bilirubin 06/01/2015 1.35* 0.20 - 1.20 mg/dL Final  . Alkaline Phosphatase 06/01/2015 49  40 - 150 U/L Final  . AST 06/01/2015 30  5 - 34 U/L Final  . ALT 06/01/2015 9  0 - 55 U/L Final  . Total Protein 06/01/2015 7.2  6.4 - 8.3 g/dL Final  . Albumin 06/01/2015 2.7* 3.5 - 5.0 g/dL Final  . Calcium 06/01/2015 8.6  8.4 - 10.4 mg/dL Final  . Anion Gap 06/01/2015 15* 3 - 11 mEq/L Final  . EGFR 06/01/2015 5* >90 ml/min/1.73 m2 Final   eGFR is calculated using the CKD-EPI Creatinine Equation (2009)  RADIOGRAPHIC STUDIES: No results found.  ASSESSMENT/PLAN:    Multiple myeloma Patient presented to the [facility] today to receive his next Velcade/daratumumab chemotherapy regimen.  Patient states that he is feeling poorly today; with increased fatigue and some mild chills.  He denies any recent fever.  Labs obtained today revealed a WBC of 2.1, ANC 1.9, hemoglobin has dropped from 9.0, down to 8.5, and platelet count is improved from 22 up to 79.  Vital signs were all stable; with the exception of very low-grade temperature of 99.0.  Labs revealed acute renal failure with a creatinine of 10.6 today.  See further notes for details.  All chemotherapy will be held today and rescheduled for a later date.  Patient is now scheduled for labs, visit, flush, and next cycle of chemotherapy on 06/04/2015.  Hypoalbuminemia due to protein-calorie malnutrition ([facility]) Albumin remains low at 2.7.  Patient was advised to push protein in his diet is much as possible.  Hyperbilirubinemia Patient with chronically elevated bilirubin at 1.35 today.  We'll continue to monitor.  Dehydration Patient appears mildly dehydrated today; with sodium down to 134.  However, patient's creatinine has drastically increased to 10.6 today.  Patient will be admitted for further evaluation and management  today.  See further notes for details.  Antineoplastic chemotherapy induced pancytopenia [facility]) Patient last received chemotherapy on 05/18/2015.  Blood counts obtained today revealed a WBC of 2.1, ANC 1.9, hemoglobin has dropped from 9.0, down to 8.5, amplitude count has improved from 22 up to 79.  Patient denies any issues with either easy bleeding or bruising at the present time.  Acute on chronic renal failure [facility]) Patient has history of chronic renal insufficiency with baseline creatinine about 1.6.  However, patient's creatinine has now increased to 10.6.  Differential diagnosis for acute renal failure should include renal failure secondary to the multiple myeloma versus side effect of patient's chemotherapy versus immunotherapy.  Brief history.  Report were called to Dr. Coralyn Pear hospitalist; prior to the patient being admitted to the telemetry bed.  Also, this provider called report to the telemetry floor nurse as well.  Patient was transported to room 1422, the a wheelchair.  Per the cancer Center nurse.  Of note: Patient should be considered a full code; since patient has no advanced directives in his chart.  Long term current use of anticoagulant therapy Patient has a history of DVT in the past; and continues to take Xarelto as directed.   Patient stated understanding of all instructions; and was in agreement with this plan of care. The patient knows to call the clinic with any problems, questions or concerns.   Total time spent with patient was 25 minutes;  with greater than 75 percent of that time spent in face to face counseling regarding patient's symptoms,  and coordination of care and follow up.  Disclaimer:This dictation was prepared with Dragon/digital dictation along with [employer]. Any transcriptional errors that result from this process are unintentional.  Drue Second, NP 06/01/2015

## 2015-06-01 NOTE — Consult Note (Signed)
Referring Provider: No ref. 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   1. HPI: This is a delightful man that has a history of multiple myeloma  Hypertension and diabetes. Status post 6 cycles of systemic chemotherapy with Revlimid and Decadron, last dose was given 07/21/2010 with very good response. 2. 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 [name].  3. maintenance Revlimid at 10 mg by mouth daily status post 2 months. Therapy began 01/18/2011. 4. maintenance Revlimid at 15 mg by mouth daily with prophylactic dose Coumadin at 2 mg by mouth daily. 5. Systemic chemotherapy with Velcade at 1.3 mg per meter squared given on days 1, 4, 8 and 11 and Doxil at 30 mg per meter square given on day 4 and Decadron 40 mg by mouth on weekly basis given every 3 weeks. Status post 4 cycles. 6. Zometa 4 mg IV every 4 weeks.  7. Velcade 1.3 mg/M2 subcutaneous daily on a weekly basis with Decadron 20 mg by mouth on a weekly basis. First cycle expected on 06/21/2012. S/P 4 cycles. 8. Systemic chemotherapy with Carfilzomib, cyclophosphamide and Decadron. First cycle started on 08/09/2012. He is status post 19 cycles. 9.  Systemic chemotherapy with Carfilzomib 36 MG/M2 on days 1, 2, 8, 9, 15 and 16 every 4 weeks, Pomalyst 4 mg by mouth daily for 21 days every 4 weeks and Decadron 40 mg by mouth weekly. First cycle started on 04/10/2014. Status post 12 cycles. Discontinued secondary to disease progression. He is currently undergoing treatment with Daratumumab, Velcade and Decadron. Status post 1 cycle and tolerated the first cycle of his treatment well except for fatigue and few pounds of weight loss secondary to lack of taste and appetite   He has a  history of DVT and treated with xeralto. He has also been treated with valcade and with lisinopril/HCTZ for hypertension  He was admitted with symptoms of metallic taste - poor appetite -- nausea and general overwhelming sense of fatigue    Lab Results  Component Value Date   CREATININE 10.6* 06/01/2015   CREATININE 3.3 Repeated and Verified* 05/28/2015   CREATININE 1.6* 05/21/2015     Past Medical History  Diagnosis Date  . Diabetes mellitus ([place]) 07/03/2011  . Hypertension 07/03/2011  . Hyperlipidemia 07/03/2011  . Colon polyps 2012  . Gastric ulcer   . Bulging lumbar disc   . Encounter for antineoplastic chemotherapy 03/05/2015  . Anemia   . Blood transfusion without reported diagnosis 2015  . Multiple myeloma 2011    currently undergoing chemo  . Cataract     Past Surgical History  Procedure Laterality Date  . Limbal stem cell transplant    . Humerus fracture surgery  2011    right  . 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  . Cataract extraction w/ intraocular lens implant Bilateral left 3/17, right 4/17    Prior to Admission medications   Medication Sig Start Date End Date Taking? Authorizing Provider  Cholecalciferol (VITAMIN D-3) 5000 UNITS TABS Take 1 tablet by mouth daily.    Historical Provider, MD  dexamethasone (DECADRON) 4 MG tablet 5 tab po on days 1,2,4,5,8,9,11 and 12 every 3 weeks, start first day of chemo. 04/18/15   Mohamed  Mohamed, MD  fluconazole (DIFLUCAN) 100 MG tablet Take 1 tablet (100 mg total) by mouth daily. 05/30/15   Curt Bears, MD  gabapentin (NEURONTIN) 300 MG capsule Take 300 mg by mouth 3 (three) times daily. Reported on 04/25/2015    Historical Provider, MD  lidocaine-prilocaine (EMLA) cream Apply 1 application topically as needed. Apply one hour before use and cover with plastic wrap. 10/17/14   Carlton Adam, PA-C  lisinopril-hydrochlorothiazide (PRINZIDE,ZESTORETIC) 20-12.5 MG tablet Take by mouth.    Historical  Provider, MD  montelukast (SINGULAIR) 10 MG tablet Take 10 mg by mouth. 04/18/15 04/17/16  Historical Provider, MD  oxyCODONE-acetaminophen (PERCOCET/ROXICET) 5-325 MG tablet Take 1 tablet by mouth every 6 (six) hours as needed for severe pain. Patient not taking: Reported on 04/25/2015 01/24/15   Curt Bears, MD  pantoprazole (PROTONIX) 40 MG tablet TAKE ONE TABLET BY MOUTH TWICE DAILY 02/02/15   Ladene Artist, MD  pioglitazone (ACTOS) 15 MG tablet Take 15 mg by mouth daily. Reported on 05/16/2015 08/19/12   Historical Provider, MD  prochlorperazine (COMPAZINE) 10 MG tablet Take 10 mg by mouth every 6 (six) hours as needed for nausea or vomiting. Reported on 05/14/2015    Historical Provider, MD  simvastatin (ZOCOR) 10 MG tablet Take 10 mg by mouth at bedtime.      Historical Provider, MD  temazepam (RESTORIL) 15 MG capsule Take 15 mg by mouth at bedtime as needed for sleep. Reported on 01/16/2015    Historical Provider, MD  valACYclovir (VALTREX) 500 MG tablet TAKE ONE CAPLET BY MOUTH ONCE DAILY 01/24/15   Curt Bears, MD  VIAGRA 50 MG tablet Take 50 mg by mouth daily as needed for erectile dysfunction.  02/23/13   Historical Provider, MD  XARELTO 20 MG TABS tablet TAKE ONE TABLET BY MOUTH ONCE DAILY WITH  SUPPER 05/04/15   Curt Bears, MD    No current facility-administered medications for this encounter.    Allergies as of 06/01/2015  . (No Known Allergies)    Family History  Problem Relation Age of Onset  . Diabetes Mother   . Hyperlipidemia Mother   . Diabetes Sister   . Diabetes Brother   . Colon cancer Maternal Uncle 58    Social History   Social History  . Marital Status: Married    Spouse Name: N/A  . Number of Children: 2  . Years of Education: N/A   Occupational History  . retired    Social History Main Topics  . Smoking status: Never Smoker   . Smokeless tobacco: Never Used  . Alcohol Use: No  . Drug Use: No  . Sexual Activity: Not on file   Other Topics  Concern  . Not on file   Social History Narrative    Review of Systems: Gen: + anorexia, fatigue, weakness, malaise, weight loss,  HEENT: No visual complaints, No history of Retinopathy. Normal external appearance No Epistaxis or Sore throat. No sinusitis.   CV: Denies chest pain, angina, palpitations, syncope, orthopnea, PND, peripheral edema, and claudication. Resp: Denies dyspnea at rest, dyspnea with exercise, cough, sputum, wheezing, coughing up blood, and pleurisy. GI: Denies vomiting blood, jaundice, and fecal incontinence.   Denies dysphagia or odynophagia.some mild diarrhea  GU : Denies urinary burning, blood in urine, urinary frequency, urinary hesitancy, nocturnal urination, and urinary incontinence.  No renal calculi. MS: Denies joint pain, limitation of movement, and swelling, stiffness, low back pain, extremity pain. Denies muscle weakness, cramps, atrophy.  No use of non  steroidal antiinflammatory drugs. Derm: Denies rash, itching, dry skin, hives, moles, warts, or unhealing ulcers.  Psych: Denies depression, anxiety, memory loss, suicidal ideation, hallucinations, paranoia, and confusion. Heme: Denies bruising, bleeding, and enlarged lymph nodes. Neuro: No headache.  No diplopia. No dysarthria.  No dysphasia.  No history of CVA.  No Seizures. No paresthesias.  No weakness. Endocrine .  No Thyroid disease.  No Adrenal disease. diabetic  Physical Exam: Vital signs in last 24 hours: Temp:  [99 F (37.2 C)] 99 F (37.2 C) (05/12 0910) Pulse Rate:  [86] 86 (05/12 0910) Resp:  [18] 18 (05/12 0910) BP: (141)/(71) 141/71 mmHg (05/12 0910) SpO2:  [100 %] 100 % (05/12 0910)   General:   Alert,  Well-developed, somewhat thin  Head:  Normocephalic and atraumatic. Eyes:  Sclera   no icterus.   Conjunctiva pale  Ears:  Normal auditory acuity. Nose:  No deformity, discharge,  or lesions. Mouth:  No deformity or lesions, dentition normal. Neck:  Supple; no masses or thyromegaly.  JVP not elevated Lungs:  Clear throughout to auscultation.   No wheezes, crackles, or rhonchi. No acute distress. Heart:  Regular rate and rhythm; no murmurs, clicks, rubs,  or gallops. Abdomen:  Soft, nontender and nondistended. No masses, hepatosplenomegaly or hernias noted. Normal bowel sounds, without guarding, and without rebound.   Msk:  Symmetrical without gross deformities. Normal posture. Pulses:  No carotid, renal, femoral bruits. DP and PT symmetrical and equal Extremities:  Without clubbing or edema. Neurologic:  Alert and  oriented x4;  grossly normal neurologically. Skin:  Intact without significant lesions or rashes. Cervical Nodes:  No significant cervical adenopathy. Psych:  Alert and cooperative. Normal mood and affect.  Intake/Output from previous day:   Intake/Output this shift:    Lab Results: No results for input(s): WBC, HGB, HCT, PLT in the last 72 hours. BMET  Recent Labs  06/01/15 0744  NA 134*  K 3.9  CO2 22  GLUCOSE 94  BUN 49.7*  CREATININE 10.6*  CALCIUM 8.6   LFT  Recent Labs  06/01/15 0744  PROT 7.2  ALBUMIN 2.7*  AST 30  ALT 9  ALKPHOS 49  BILITOT 1.35*   PT/INR No results for input(s): LABPROT, INR in the last 72 hours. Hepatitis Panel No results for input(s): HEPBSAG, HCVAB, HEPAIGM, HEPBIGM in the last 72 hours.  Studies/Results: No results found.  Assessment/Plan:  Acute on chronic renal failure -- etiology could be drug induced renal failure although it does not appear that valcade or daratumamab cause acute renal failure. There may be obstruction and we shall evaluate with renal ultrasound and also with bladder scan. He may be volume depleted although clinically appears well hydrated , We shall check a urine looking for intrinsic renal diseases  Acute GN or AIN or ATN.  He is not hypercalcemic and no evidence of tumor lysis syndrome. Could this be crystal induced renal disease from his valcyte use or pigment nephropathy from  his use of statin  -- we could check CPK and uric acid levels. It is also possible that this is due to light chain deposition from his myeloma light chains. ( we could check and assess light chain burden )   Uremia  Some of his symptoms could be from uremia  Hypertension stopped ACE inhibitor    LOS: 0 Jerimah Witucki W @TODAY @11 :29 AM

## 2015-06-02 LAB — CBC
HEMATOCRIT: 19.9 % — AB (ref 39.0–52.0)
HEMOGLOBIN: 6.7 g/dL — AB (ref 13.0–17.0)
MCH: 25.8 pg — AB (ref 26.0–34.0)
MCHC: 33.7 g/dL (ref 30.0–36.0)
MCV: 76.5 fL — ABNORMAL LOW (ref 78.0–100.0)
Platelets: 100 10*3/uL — ABNORMAL LOW (ref 150–400)
RBC: 2.6 MIL/uL — AB (ref 4.22–5.81)
RDW: 15.3 % (ref 11.5–15.5)
WBC: 2.8 10*3/uL — ABNORMAL LOW (ref 4.0–10.5)

## 2015-06-02 LAB — HEPARIN LEVEL (UNFRACTIONATED)
Heparin Unfractionated: 0.84 IU/mL — ABNORMAL HIGH (ref 0.30–0.70)
Heparin Unfractionated: 0.6 IU/mL (ref 0.30–0.70)

## 2015-06-02 LAB — BASIC METABOLIC PANEL
ANION GAP: 14 (ref 5–15)
BUN: 63 mg/dL — ABNORMAL HIGH (ref 6–20)
CO2: 20 mmol/L — AB (ref 22–32)
Calcium: 7.7 mg/dL — ABNORMAL LOW (ref 8.9–10.3)
Chloride: 101 mmol/L (ref 101–111)
Creatinine, Ser: 10.71 mg/dL — ABNORMAL HIGH (ref 0.61–1.24)
GFR, EST AFRICAN AMERICAN: 5 mL/min — AB (ref >60)
GFR, EST NON AFRICAN AMERICAN: 4 mL/min — AB (ref >60)
GLUCOSE: 235 mg/dL — AB (ref 65–99)
POTASSIUM: 5.1 mmol/L (ref 3.5–5.1)
SODIUM: 135 mmol/L (ref 135–145)

## 2015-06-02 LAB — GLUCOSE, CAPILLARY
GLUCOSE-CAPILLARY: 238 mg/dL — AB (ref 65–99)
GLUCOSE-CAPILLARY: 240 mg/dL — AB (ref 65–99)
GLUCOSE-CAPILLARY: 216 mg/dL — AB (ref 65–99)
Glucose-Capillary: 208 mg/dL — ABNORMAL HIGH (ref 65–99)

## 2015-06-02 LAB — PREPARE RBC (CROSSMATCH)

## 2015-06-02 LAB — HEMOGLOBIN AND HEMATOCRIT, BLOOD
HEMATOCRIT: 21 % — AB (ref 39.0–52.0)
HEMOGLOBIN: 7.1 g/dL — AB (ref 13.0–17.0)

## 2015-06-02 LAB — APTT: aPTT: 145 seconds — ABNORMAL HIGH (ref 24–37)

## 2015-06-02 LAB — MAGNESIUM: MAGNESIUM: 1.2 mg/dL — AB (ref 1.7–2.4)

## 2015-06-02 MED ORDER — MAGNESIUM SULFATE 50 % IJ SOLN
3.0000 g | Freq: Once | INTRAVENOUS | Status: AC
  Administered 2015-06-02: 3 g via INTRAVENOUS
  Filled 2015-06-02: qty 6

## 2015-06-02 MED ORDER — SODIUM CHLORIDE 0.9 % IV SOLN
Freq: Once | INTRAVENOUS | Status: AC
  Administered 2015-06-02: 09:00:00 via INTRAVENOUS

## 2015-06-02 MED ORDER — HEPARIN (PORCINE) IN NACL 100-0.45 UNIT/ML-% IJ SOLN
850.0000 [IU]/h | INTRAMUSCULAR | Status: DC
  Administered 2015-06-02 – 2015-06-03 (×2): 1000 [IU]/h via INTRAVENOUS
  Administered 2015-06-04: 850 [IU]/h via INTRAVENOUS
  Filled 2015-06-02 (×2): qty 250

## 2015-06-02 NOTE — Progress Notes (Signed)
ANTICOAGULATION CONSULT NOTE - Initial Consult  Pharmacy Consult for Heparin Indication: Hx of DVT (rivaroxaban held for renal failure)  No Known Allergies  Patient Measurements: Height: 5\' 11"  (180.3 cm) Weight: 155 lb 6.8 oz (70.5 kg) IBW/kg (Calculated) : 75.3 Heparin Dosing Weight: actual weight  Vital Signs: Temp: 98.1 F (36.7 C) (05/13 1500) Temp Source: Oral (05/13 1500) BP: 121/71 mmHg (05/13 1500) Pulse Rate: 76 (05/13 1500)  Labs:  Recent Labs  06/01/15 0744 06/01/15 1230  06/01/15 1405 06/01/15 2200 06/02/15 0500 06/02/15 0839 06/02/15 0927 06/02/15 1130  HGB  --   --   < > 8.1*  --  6.7*  --   --  7.1*  HCT  --   --   --  24.4*  --  19.9*  --   --  21.0*  PLT  --   --   --  101*  --  100*  --   --   --   APTT  --   --   --  29 81*  --   --  145*  --   HEPARINUNFRC  --   --   --  <0.10* 0.30  --  0.84*  --   --   CREATININE 10.6*  --   --   --   --  10.71*  --   --   --   CKTOTAL  --  45*  --   --   --   --   --   --   --   < > = values in this interval not displayed.  Estimated Creatinine Clearance: 6.5 mL/min (by C-G formula based on Cr of 10.71).  Assessment: 51 yoM admitted on 5/12 with acute on CKD (baseline SCr 1.6, currently 10.6).  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  Rivaroxaban is held for acute renal failure and Pharmacy is consulted to dose Heparin.  PTA Xarelto 20mg  daily.  Last dose taken on 5/10 at unknown time (originally reported as last dose on 5/11 at 2330)  Today, 06/02/2015:  Heparin level 0.84  APTT 145.  May use only heparin level, no longer needing APTT since previously correlating with HL.  Checked APTT since HL elevated and increased more than expected.  Heparin running through port site, HL obtained from peripheral stick.  SCr increased to 10.7 with CrCl ~ 6 ml/min.  CBC: Hgb decreased to 6.7 and Plt improved to 100.  Note chronic  anemia (baseline Hgb 8-10) and thrombocytopenia (baseline Plt < 100, as low as 22 on 05/21/15).  Plannign to transfuse 1 unit PRBC today.  RN reports no active bleeding or complications despite drop in Hgb.   Goal of Therapy:  Heparin level 0.3-0.7 units/ml aPTT 66-102 seconds Monitor platelets by anticoagulation protocol: Yes   Plan:   Decrease to heparin IV infusion at 1000 units/hr  Heparin level 8 hours after rate change  Daily heparin level and CBC.  Monitor for s/s bleeding.  Gretta Arab PharmD, BCPS Pager [phone] 06/02/2015 5:13 PM    Addendum: Jeannene Patella RN has informed me that they will no longer give PRBC transfusion today.  Hgb improved to 7.1 but also no blood is currently available d/t multiple antibodies and difficult to match sample. Patient reports no bleeding or complications. Recheck HL = 0.6, therapeutic on Heparin at 1000 units/hr.  Plan:   Continue heparin IV infusion at 1000 units/hr  Heparin level in 8 hours to verify  therapeutic rate  Gretta Arab PharmD, BCPS Pager [phone] 06/02/2015 5:15 PM

## 2015-06-02 NOTE — Progress Notes (Signed)
[name] ROUNDING NOTE   Subjective:   Interval History: no complaints this am  Objective:  Vital signs in last 24 hours:  Temp:  [97.9 F (36.6 C)-98.3 F (36.8 C)] 97.9 F (36.6 C) (05/13 0617) Pulse Rate:  [63-88] 63 (05/13 0617) Resp:  [18-24] 22 (05/13 0617) BP: (121-158)/(70-84) 121/70 mmHg (05/13 0617) SpO2:  [99 %-100 %] 99 % (05/13 0617) Weight:  [70.5 kg (155 lb 6.8 oz)] 70.5 kg (155 lb 6.8 oz) (05/12 1229)  Weight change:  Filed Weights   06/01/15 1229  Weight: 70.5 kg (155 lb 6.8 oz)    Intake/Output: I/O last 3 completed shifts: In: [id number] [P.O.:120; I.V.:1650] Out: 375 [Urine:375]   Intake/Output this shift:     General: Alert, Well-developed, somewhat thin  Head: Normocephalic and atraumatic. Neck: Supple; no masses or thyromegaly. JVP not elevated Lungs: Clear throughout to auscultation. No wheezes, crackles, or rhonchi. No acute distress. Heart: Regular rate and rhythm; no murmurs, clicks, rubs, or gallops. Abdomen: Soft, nontender and nondistended. No masses, hepatosplenomegaly or hernias noted. Normal bowel sounds, without guarding, and without rebound.  Msk: Symmetrical without gross deformities. Normal posture. Pulses: No carotid, renal, femoral bruits. DP and PT symmetrical and equal Extremities: Without clubbing or edema.    Basic Metabolic Panel:  Recent Labs Lab 05/28/15 0849 06/01/15 0744 06/02/15 0500  NA 139 134* 135  K 3.7 3.9 5.1  CL  --   --  101  CO2 28 22 20*  GLUCOSE 133 94 235*  BUN 25.1 49.7* 63*  CREATININE 3.3 Repeated and Verified* 10.6* 10.71*  CALCIUM 10.0 8.6 7.7*    Liver Function Tests:  Recent Labs Lab 05/28/15 0849 06/01/15 0744  AST 25 30  ALT 11 9  ALKPHOS 48 49  BILITOT 1.39* 1.35*  PROT 7.7 7.2  ALBUMIN 2.8* 2.7*   No results for input(s): LIPASE, AMYLASE in the last 168 hours. No results for input(s): AMMONIA in the last 168 hours.  CBC:  Recent Labs Lab  05/28/15 0849 06/01/15 1405 06/02/15 0500  WBC 2.1* 2.7* 2.8*  NEUTROABS 1.9  --   --   HGB 8.5* 8.1* 6.7*  HCT 26.8* 24.4* 19.9*  MCV 82.5 79.2 76.5*  PLT 79* 101* 100*    Cardiac Enzymes:  Recent Labs Lab 06/01/15 1230  CKTOTAL 45*    BNP: Invalid input(s): POCBNP  CBG:  Recent Labs Lab 06/01/15 1657 06/01/15 2129 06/02/15 0757  GLUCAP 259* 383* 208*    Microbiology: Results for orders placed or performed in visit on 05/21/15  TECHNOLOGIST REVIEW     Status: None   Collection Time: 05/21/15  8:38 AM  Result Value Ref Range Status   Technologist Review Few Metas and Myelocytes present and few helmets  Final   *Note: Due to a large number of results and/or encounters for the requested time period, some results have not been displayed. A complete set of results can be found in Results Review.    Coagulation Studies: No results for input(s): LABPROT, INR in the last 72 hours.  Urinalysis:  Recent Labs  06/01/15 1540  COLORURINE YELLOW  LABSPEC 1.008  PHURINE 6.0  GLUCOSEU NEGATIVE  HGBUR TRACE*  BILIRUBINUR NEGATIVE  KETONESUR NEGATIVE  PROTEINUR 30*  NITRITE NEGATIVE  LEUKOCYTESUR NEGATIVE      Imaging: US Renal  06/01/2015  CLINICAL DATA:  Acute kidney injury, diabetes mellitus, hypertension and EXAM: RENAL / URINARY TRACT ULTRASOUND COMPLETE COMPARISON:  Abdominal ultrasound 03/13/2015 FINDINGS: Right Kidney: Length:  11.8 cm. Normal cortical thickness. Increased cortical echogenicity. No hydronephrosis or shadowing calcification. Two cysts identified at mid RIGHT kidney 1.6 x 1.4 x 1.5 cm and at the mid inferior pole 2.9 x 2.5 x 2.4 cm, smaller lesion demonstrating scattered internal echogenicity/complication. Left Kidney: Length: 13.9 cm. Normal cortical thickness. Increased cortical echogenicity. No mass, hydronephrosis or shadowing calcification. Bladder: Partially distended, unremarkable. IMPRESSION: Medical renal disease changes of both kidneys.  2 RIGHT renal cysts, larger 2.9 cm greatest size simple in character with smaller lesion 1.6 cm greatest size complicated by scattered internal echogenicity. Either followup ultrasound in 4-6 months to demonstrate stability or characterization by followup non emergent MRI after discharge when patient can optimally cooperate with MR imaging recommended to exclude cystic renal neoplasm. Electronically Signed   By: Lavonia Dana M.D.   On: 06/01/2015 13:46     Medications:   . sodium chloride 125 mL/hr at 06/02/15 0920  . heparin 1,000 Units/hr (06/02/15 0921)   . feeding supplement (ENSURE ENLIVE)  237 mL Oral BID BM  . insulin aspart  0-15 Units Subcutaneous TID WC  . insulin aspart  0-5 Units Subcutaneous QHS  . isosorbide mononitrate  30 mg Oral Daily  . sodium chloride flush  3 mL Intravenous Q12H   acetaminophen **OR** acetaminophen, morphine injection, ondansetron **OR** ondansetron (ZOFRAN) IV, oxyCODONE, sodium chloride flush  Assessment/ Plan:   Acute on chronic renal failure -- etiology could be drug induced renal failure although it does not appear that valcade or daratumamab cause acute renal failure. Renal  Ultrasound was negative for obstruction and urinalysis was also unremarkable  SPEP pending although this may be from myeloma -- renal biopsy may be helpful  Uremia Some of his symptoms could be from uremia  Hypertension stopped ACE inhibitor  We shall continue to monitor renal function over the weekend    LOS: 1 Eddie Thomas W @TODAY @9 :47 AM

## 2015-06-02 NOTE — Progress Notes (Signed)
Central telemetry informed nurse of 14 beats of vtach.  Confirmed by nurse on telemetry strip.  Pt asymptomatic.  MD notified, will continue to monitor.

## 2015-06-02 NOTE — Progress Notes (Signed)
ANTICOAGULATION CONSULT NOTE - Initial Consult  Pharmacy Consult for Heparin Indication: Hx of DVT (rivaroxaban held for renal failure)  No Known Allergies  Patient Measurements: Height: 5\' 11"  (180.3 cm) Weight: 155 lb 6.8 oz (70.5 kg) IBW/kg (Calculated) : 75.3 Heparin Dosing Weight: actual weight  Vital Signs: Temp: 97.9 F (36.6 C) (05/13 0617) Temp Source: Oral (05/13 0617) BP: 121/70 mmHg (05/13 0617) Pulse Rate: 63 (05/13 0617)  Labs:  Recent Labs  06/01/15 0744 06/01/15 1230 06/01/15 1405 06/01/15 2200 06/02/15 0500  HGB  --   --  8.1*  --  6.7*  HCT  --   --  24.4*  --  19.9*  PLT  --   --  101*  --  100*  APTT  --   --  29 81*  --   HEPARINUNFRC  --   --  <0.10* 0.30  --   CREATININE 10.6*  --   --   --  10.71*  CKTOTAL  --  45*  --   --   --     Estimated Creatinine Clearance: 6.5 mL/min (by C-G formula based on Cr of 10.71).  Assessment: 89 yoM admitted on 5/12 with acute on CKD (baseline SCr 1.6, currently 10.6).  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  Rivaroxaban is held for acute renal failure and Pharmacy is consulted to dose Heparin.  PTA Xarelto 20mg  daily.  Last dose taken on 5/10 at unknown time (originally reported as last dose on 5/11 at 2330)  Today, 06/02/2015:  Heparin level 0.84  APTT 145.  May use only heparin level, no longer needing APTT since previously correlating with HL.  Checked APTT since HL elevated and increased more than expected.  Heparin running through port site, HL obtained from peripheral stick.  SCr increased to 10.7 with CrCl ~ 6 ml/min.  CBC: Hgb decreased to 6.7 and Plt improved to 100.  Note chronic anemia (baseline Hgb 8-10) and thrombocytopenia (baseline Plt < 100, as low as 22 on 05/21/15).  Plannign to transfuse 1 unit PRBC today.  RN reports no active bleeding or complications despite drop in Hgb.   Goal of Therapy:   Heparin level 0.3-0.7 units/ml aPTT 66-102 seconds Monitor platelets by anticoagulation protocol: Yes   Plan:   Decrease to heparin IV infusion at 1000 units/hr  Heparin level 8 hours after rate change  Daily heparin level and CBC.  Monitor for s/s bleeding.  Gretta Arab PharmD, BCPS Pager [phone] 06/02/2015 8:39 AM

## 2015-06-02 NOTE — Progress Notes (Signed)
PROGRESS NOTE    Eddie Thomas  OZH:[id number] DOB: May 10, 1945 DOA: 06/01/2015 PCP: [name]  Outpatient Specialists: Oncology dr Julien Nordmann.     Brief Narrative: Eddie Thomas is a 70 y.o. male with medical history significant of multiple myeloma, IgA subtype diagnosed in 201, currently undergoing treatment with Velcade/Daratumumab at the cancer center, who follows Dr. Julien Nordmann of medical oncology, also having a history of chronic kidney disease (with baseline creatinine near 1.6-2.0), hypertension, diabetes mellitus, presented as a transfer from the cancer center. He presented to the cancer today for follow-up visit, where he had reported having generalized weakness, fatigue,  intolerance to physical exertion, . He was found to be acute renal failure. Renal consulted adn recommendations given.   Assessment & Plan:   Principal Problem:   Acute on chronic renal failure (HCC) Active Problems:   Multiple myeloma (HCC)   Hypertension   Bone marrow transplant complication (HCC)   Dehydration   Hypoalbuminemia due to protein-calorie malnutrition (HCC)   Antineoplastic chemotherapy induced pancytopenia (HCC)   Long term current use of anticoagulant therapy   Acute renal failure (ARF) (HCC)   Acute renal failure: Probably drug induced vs from multiple myeloma vs from volume depletion.  Obstructive causes ruled out.  Hydration and if renal function does not improve,he will probably need a renal biopsy.  Dr Justin Mend on board and appreciate recommendations.    Generalized weakness from the above.   15 beats oF vtach: Low mag on lab work, IV mag ordered. Monitor.  Get repeat levels in am.   Multiple Myeloma: Follows with Dr Julien Nordmann.         DVT prophylaxis: (heparin). Code Status: (Full Family Communication: none at bedside.  Disposition Plan: pending further investigation.    Consultants:   Renal.   Procedures: US renal     Antimicrobials:  none   Subjective: Feeling okay   Objective: Filed Vitals:   06/01/15 1229 06/01/15 2135 06/02/15 0617 06/02/15 1500  BP: 158/84 147/84 121/70 121/71  Pulse: 88 72 63 76  Temp: 98 F (36.7 C) 98.3 F (36.8 C) 97.9 F (36.6 C) 98.1 F (36.7 C)  TempSrc: Oral Oral Oral Oral  Resp: 18 24 22 20   Height: 5' 11"  (1.803 m)     Weight: 70.5 kg (155 lb 6.8 oz)     SpO2: 100% 100% 99% 100%    Intake/Output Summary (Last 24 hours) at 06/02/15 1817 Last data filed at 06/02/15 1700  Gross per 24 hour  Intake   3330 ml  Output    900 ml  Net   2430 ml   Filed Weights   06/01/15 1229  Weight: 70.5 kg (155 lb 6.8 oz)    Examination:  General exam: Appears calm and comfortable  Respiratory system: Clear to auscultation. Respiratory effort normal. Cardiovascular system: S1 & S2 heard, RRR. No JVD, murmurs, rubs, gallops or clicks. No pedal edema. Gastrointestinal system: Abdomen is nondistended, soft and nontender. No organomegaly or masses felt. Normal bowel sounds heard. Central nervous system: Alert and oriented. No focal neurological deficits. Extremities: Symmetric 5 x 5 power. Skin: No rashes, lesions or ulcers Psychiatry: Judgement and insight appear normal. Mood & affect appropriate.     Data Reviewed: I have personally reviewed following labs and imaging studies  CBC:  Recent Labs Lab 05/28/15 0849 06/01/15 1405 06/02/15 0500 06/02/15 1130  WBC 2.1* 2.7* 2.8*  --   NEUTROABS 1.9  --   --   --   HGB  8.5* 8.1* 6.7* 7.1*  HCT 26.8* 24.4* 19.9* 21.0*  MCV 82.5 79.2 76.5*  --   PLT 79* 101* 100*  --    Basic Metabolic Panel:  Recent Labs Lab 05/28/15 0849 06/01/15 0744 06/02/15 0500 06/02/15 1130  NA 139 134* 135  --   K 3.7 3.9 5.1  --   CL  --   --  101  --   CO2 28 22 20*  --   GLUCOSE 133 94 235*  --   BUN 25.1 49.7* 63*  --   CREATININE 3.3 Repeated and Verified* 10.6* 10.71*  --   CALCIUM 10.0 8.6 7.7*  --   MG  --   --   --  1.2*    GFR: Estimated Creatinine Clearance: 6.5 mL/min (by C-G formula based on Cr of 10.71). Liver Function Tests:  Recent Labs Lab 05/28/15 0849 06/01/15 0744  AST 25 30  ALT 11 9  ALKPHOS 48 49  BILITOT 1.39* 1.35*  PROT 7.7 7.2  ALBUMIN 2.8* 2.7*   No results for input(s): LIPASE, AMYLASE in the last 168 hours. No results for input(s): AMMONIA in the last 168 hours. Coagulation Profile: No results for input(s): INR, PROTIME in the last 168 hours. Cardiac Enzymes:  Recent Labs Lab 06/01/15 1230  CKTOTAL 45*   BNP (last 3 results) No results for input(s): PROBNP in the last 8760 hours. HbA1C: No results for input(s): HGBA1C in the last 72 hours. CBG:  Recent Labs Lab 06/01/15 1657 06/01/15 2129 06/02/15 0757 06/02/15 1157 06/02/15 1701  GLUCAP 259* 383* 208* 238* 240*   Lipid Profile: No results for input(s): CHOL, HDL, LDLCALC, TRIG, CHOLHDL, LDLDIRECT in the last 72 hours. Thyroid Function Tests: No results for input(s): TSH, T4TOTAL, FREET4, T3FREE, THYROIDAB in the last 72 hours. Anemia Panel: No results for input(s): VITAMINB12, FOLATE, FERRITIN, TIBC, IRON, RETICCTPCT in the last 72 hours. Urine analysis:    Component Value Date/Time   COLORURINE YELLOW 06/01/2015 [place] 06/01/2015 1540   LABSPEC 1.008 06/01/2015 1540   PHURINE 6.0 06/01/2015 1540   GLUCOSEU NEGATIVE 06/01/2015 1540   HGBUR TRACE* 06/01/2015 1540   BILIRUBINUR NEGATIVE 06/01/2015 1540   KETONESUR NEGATIVE 06/01/2015 1540   PROTEINUR 30* 06/01/2015 1540   UROBILINOGEN 1.0 01/16/2012 1902   NITRITE NEGATIVE 06/01/2015 1540   LEUKOCYTESUR NEGATIVE 06/01/2015 1540   Sepsis Labs: No results for input(s): PROCALCITON, LATICACIDVEN in the last 168 hours.  No results found for this or any previous visit (from the past 240 hour(s)).       Radiology Studies: US Renal  06/01/2015  CLINICAL DATA:  Acute kidney injury, diabetes mellitus, hypertension and EXAM: RENAL  / URINARY TRACT ULTRASOUND COMPLETE COMPARISON:  Abdominal ultrasound 03/13/2015 FINDINGS: Right Kidney: Length: 11.8 cm. Normal cortical thickness. Increased cortical echogenicity. No hydronephrosis or shadowing calcification. Two cysts identified at mid RIGHT kidney 1.6 x 1.4 x 1.5 cm and at the mid inferior pole 2.9 x 2.5 x 2.4 cm, smaller lesion demonstrating scattered internal echogenicity/complication. Left Kidney: Length: 13.9 cm. Normal cortical thickness. Increased cortical echogenicity. No mass, hydronephrosis or shadowing calcification. Bladder: Partially distended, unremarkable. IMPRESSION: Medical renal disease changes of both kidneys. 2 RIGHT renal cysts, larger 2.9 cm greatest size simple in character with smaller lesion 1.6 cm greatest size complicated by scattered internal echogenicity. Either followup ultrasound in 4-6 months to demonstrate stability or characterization by followup non emergent MRI after discharge when patient can optimally cooperate with MR imaging  recommended to exclude cystic renal neoplasm. Electronically Signed   By: Lavonia Dana M.D.   On: 06/01/2015 13:46        Scheduled Meds: . feeding supplement (ENSURE ENLIVE)  237 mL Oral BID BM  . insulin aspart  0-15 Units Subcutaneous TID WC  . insulin aspart  0-5 Units Subcutaneous QHS  . isosorbide mononitrate  30 mg Oral Daily  . magnesium sulfate 1 - 4 g bolus IVPB  3 g Intravenous Once  . sodium chloride flush  3 mL Intravenous Q12H   Continuous Infusions: . sodium chloride 125 mL/hr at 06/02/15 1657  . heparin 1,000 Units/hr (06/02/15 1433)     LOS: 1 day    Time spent: 25 minutes.     Hosie Poisson, MD [facility] Pager [phone]  If 7PM-7AM, please contact night-coverage [email or web address] Password [facility] 06/02/2015, 6:17 PM

## 2015-06-03 LAB — HEPARIN LEVEL (UNFRACTIONATED)
HEPARIN UNFRACTIONATED: 0.67 [IU]/mL (ref 0.30–0.70)
Heparin Unfractionated: 0.79 IU/mL — ABNORMAL HIGH (ref 0.30–0.70)

## 2015-06-03 LAB — CBC
HCT: 22.4 % — ABNORMAL LOW (ref 39.0–52.0)
HEMATOCRIT: 17.8 % — AB (ref 39.0–52.0)
HEMOGLOBIN: 6.1 g/dL — AB (ref 13.0–17.0)
Hemoglobin: 7.8 g/dL — ABNORMAL LOW (ref 13.0–17.0)
MCH: 26.8 pg (ref 26.0–34.0)
MCH: 27.4 pg (ref 26.0–34.0)
MCHC: 34.3 g/dL (ref 30.0–36.0)
MCHC: 34.8 g/dL (ref 30.0–36.0)
MCV: 78.1 fL (ref 78.0–100.0)
MCV: 78.6 fL (ref 78.0–100.0)
PLATELETS: 92 10*3/uL — AB (ref 150–400)
Platelets: 90 10*3/uL — ABNORMAL LOW (ref 150–400)
RBC: 2.28 MIL/uL — ABNORMAL LOW (ref 4.22–5.81)
RBC: 2.85 MIL/uL — ABNORMAL LOW (ref 4.22–5.81)
RDW: 16 % — AB (ref 11.5–15.5)
RDW: 15.5 % (ref 11.5–15.5)
WBC: 2.8 10*3/uL — ABNORMAL LOW (ref 4.0–10.5)
WBC: 2.7 10*3/uL — AB (ref 4.0–10.5)

## 2015-06-03 LAB — BASIC METABOLIC PANEL
ANION GAP: 13 (ref 5–15)
BUN: 65 mg/dL — ABNORMAL HIGH (ref 6–20)
CHLORIDE: 107 mmol/L (ref 101–111)
CO2: 16 mmol/L — AB (ref 22–32)
Calcium: 7.1 mg/dL — ABNORMAL LOW (ref 8.9–10.3)
Creatinine, Ser: 9.48 mg/dL — ABNORMAL HIGH (ref 0.61–1.24)
GFR calc non Af Amer: 5 mL/min — ABNORMAL LOW (ref >60)
GFR, EST AFRICAN AMERICAN: 6 mL/min — AB (ref >60)
GLUCOSE: 145 mg/dL — AB (ref 65–99)
Potassium: 4.2 mmol/L (ref 3.5–5.1)
Sodium: 136 mmol/L (ref 135–145)

## 2015-06-03 LAB — GLUCOSE, CAPILLARY
GLUCOSE-CAPILLARY: 177 mg/dL — AB (ref 65–99)
Glucose-Capillary: 127 mg/dL — ABNORMAL HIGH (ref 65–99)
Glucose-Capillary: 307 mg/dL — ABNORMAL HIGH (ref 65–99)
Glucose-Capillary: 274 mg/dL — ABNORMAL HIGH (ref 65–99)

## 2015-06-03 LAB — MAGNESIUM: Magnesium: 1.9 mg/dL (ref 1.7–2.4)

## 2015-06-03 LAB — PREPARE RBC (CROSSMATCH)

## 2015-06-03 MED ORDER — SODIUM BICARBONATE 650 MG PO TABS
650.0000 mg | ORAL_TABLET | Freq: Three times a day (TID) | ORAL | Status: DC
  Administered 2015-06-03 – 2015-06-06 (×11): 650 mg via ORAL
  Filled 2015-06-03 (×11): qty 1

## 2015-06-03 MED ORDER — DIPHENHYDRAMINE HCL 50 MG/ML IJ SOLN
25.0000 mg | Freq: Once | INTRAMUSCULAR | Status: AC
  Administered 2015-06-03: 25 mg via INTRAVENOUS
  Filled 2015-06-03: qty 1

## 2015-06-03 MED ORDER — METHYLPREDNISOLONE SODIUM SUCC 125 MG IJ SOLR
80.0000 mg | Freq: Once | INTRAMUSCULAR | Status: AC
  Administered 2015-06-03: 80 mg via INTRAVENOUS
  Filled 2015-06-03: qty 2

## 2015-06-03 MED ORDER — SODIUM CHLORIDE 0.9 % IV SOLN
Freq: Once | INTRAVENOUS | Status: AC
  Administered 2015-06-03: 08:00:00 via INTRAVENOUS

## 2015-06-03 NOTE — Progress Notes (Signed)
ANTICOAGULATION CONSULT NOTE - Follow Up Consult  Pharmacy Consult for Heparin Indication: Hx of DVT (rivaroxaban held for renal failure)  No Known Allergies  Patient Measurements: Height: 5\' 11"  (180.3 cm) Weight: 155 lb 6.8 oz (70.5 kg) IBW/kg (Calculated) : 75.3 Heparin Dosing Weight:   Vital Signs: Temp: 98.1 F (36.7 C) (05/14 0444) Temp Source: Oral (05/14 0444) BP: 110/62 mmHg (05/14 0444) Pulse Rate: 68 (05/14 0444)  Labs:  Recent Labs  06/01/15 0744 06/01/15 1230  06/01/15 1405 06/01/15 2200 06/02/15 0500 06/02/15 0839 06/02/15 0927 06/02/15 1130 06/02/15 1808 06/03/15 0131  HGB  --   --   < > 8.1*  --  6.7*  --   --  7.1*  --  6.1*  HCT  --   --   < > 24.4*  --  19.9*  --   --  21.0*  --  17.8*  PLT  --   --   --  101*  --  100*  --   --   --   --  90*  APTT  --   --   --  29 81*  --   --  145*  --   --   --   HEPARINUNFRC  --   --   < > <0.10* 0.30  --  0.84*  --   --  0.60 0.67  CREATININE 10.6*  --   --   --   --  10.71*  --   --   --   --   --   CKTOTAL  --  45*  --   --   --   --   --   --   --   --   --   < > = values in this interval not displayed.  Estimated Creatinine Clearance: 6.5 mL/min (by C-G formula based on Cr of 10.71).   Medications:  Infusions:  . sodium chloride 125 mL/hr at 06/03/15 0213  . heparin 1,000 Units/hr (06/02/15 1433)    Assessment: Patient with heparin level at goal for second time.  No heparin issues or bleeding per RN.  However, Hgb noted to drop to 6.1.  Night coverage made aware.  Goal of Therapy:  Heparin level 0.3-0.7 units/ml Monitor platelets by anticoagulation protocol: Yes   Plan:  Continue heparin drip at current rate Recheck level by at least AM labs 5/15.  Tyler Deis, Shea Stakes [place] 06/03/2015,5:10 AM

## 2015-06-03 NOTE — Progress Notes (Signed)
PROGRESS NOTE    Eddie Thomas  XIP:[id number] DOB: [birth year] DOA: 06/01/2015 PCP: [name]  Outpatient Specialists: Oncology dr Julien Nordmann.     Brief Narrative: Eddie Thomas is a 70 y.o. male with medical history significant of multiple myeloma, IgA subtype diagnosed in 201, currently undergoing treatment with Velcade/Daratumumab at the cancer center, who follows Dr. Julien Nordmann of medical oncology, also having a history of chronic kidney disease (with baseline creatinine near 1.6-2.0), hypertension, diabetes mellitus, presented as a transfer from the cancer center. He presented to the cancer today for follow-up visit, where he had reported having generalized weakness, fatigue,  intolerance to physical exertion, . He was found to be acute renal failure. Renal consulted adn recommendations given.   Assessment & Plan:   Principal Problem:   Acute on chronic renal failure (HCC) Active Problems:   Multiple myeloma (HCC)   Hypertension   Bone marrow transplant complication (HCC)   Dehydration   Hypoalbuminemia due to protein-calorie malnutrition (HCC)   Antineoplastic chemotherapy induced pancytopenia (HCC)   Long term current use of anticoagulant therapy   Acute renal failure (ARF) (HCC)   Acute renal failure: Probably drug induced vs from multiple myeloma vs from volume depletion.  Obstructive causes ruled out.  Hydration and if renal function does not improve,he will probably need a renal biopsy and transfer to [facility].  Dr Justin Mend on board and appreciate recommendations.    Generalized weakness from the above.   15 beats oF vtach: Low mag on lab work, IV mag ordered. Monitor.  Get repeat levels in am.   Multiple Myeloma: Follows with Dr Julien Nordmann.   ANEMIA:  secondary to multiple myeloma and ESRD.  Ordered 2 units of prbc and repeat in am.         DVT prophylaxis: (heparin). Code Status: (Full Family Communication: none at bedside.  Disposition Plan:  pending further investigation.    Consultants:   Renal.   Procedures: US renal     Antimicrobials: none   Subjective: Feeling okay , no new complaints.   Objective: Filed Vitals:   06/03/15 1345 06/03/15 1510 06/03/15 1540 06/03/15 1750  BP: 138/81 136/76 148/79 173/91  Pulse: 74 74 73 71  Temp: 97.3 F (36.3 C) 98.2 F (36.8 C) 98.4 F (36.9 C) 98.7 F (37.1 C)  TempSrc: Oral Oral Oral Oral  Resp: _0 Height:      Weight:      SpO2: 100% 100% 99% 100%    Intake/Output Summary (Last 24 hours) at 06/03/15 1903 Last data filed at 06/03/15 1800  Gross per 24 hour  Intake   2441 ml  Output    950 ml  Net   1491 ml   Filed Weights   06/01/15 1229  Weight: 70.5 kg (155 lb 6.8 oz)    Examination:  General exam: Appears calm and comfortable  Respiratory system: Clear to auscultation. Respiratory effort normal. Cardiovascular system: S1 & S2 heard, RRR. No JVD, murmurs, rubs, gallops or clicks. No pedal edema. Gastrointestinal system: Abdomen is nondistended, soft and nontender. No organomegaly or masses felt. Normal bowel sounds heard. Central nervous system: Alert and oriented. No focal neurological deficits. Extremities: Symmetric 5 x 5 power. Skin: No rashes, lesions or ulcers Psychiatry: Judgement and insight appear normal. Mood & affect appropriate.     Data Reviewed: I have personally reviewed following labs and imaging studies  CBC:  Recent Labs Lab 05/28/15 0849 06/01/15 1405 06/02/15 0500 06/02/15 1130 06/03/15 0131  WBC 2.1* 2.7* 2.8*  --  2.8*  NEUTROABS 1.9  --   --   --   --   HGB 8.5* 8.1* 6.7* 7.1* 6.1*  HCT 26.8* 24.4* 19.9* 21.0* 17.8*  MCV 82.5 79.2 76.5*  --  78.1  PLT 79* 101* 100*  --  90*   Basic Metabolic Panel:  Recent Labs Lab 05/28/15 0849 06/01/15 0744 06/02/15 0500 06/02/15 1130 06/03/15 0935  NA 139 134* 135  --  136  K 3.7 3.9 5.1  --  4.2  CL  --   --  101  --  107  CO2 28 22 20*  --  16*    GLUCOSE 133 94 235*  --  145*  BUN 25.1 49.7* 63*  --  65*  CREATININE 3.3 Repeated and Verified* 10.6* 10.71*  --  9.48*  CALCIUM 10.0 8.6 7.7*  --  7.1*  MG  --   --   --  1.2* 1.9   GFR: Estimated Creatinine Clearance: 7.3 mL/min (by C-G formula based on Cr of 9.48). Liver Function Tests:  Recent Labs Lab 05/28/15 0849 06/01/15 0744  AST 25 30  ALT 11 9  ALKPHOS 48 49  BILITOT 1.39* 1.35*  PROT 7.7 7.2  ALBUMIN 2.8* 2.7*   No results for input(s): LIPASE, AMYLASE in the last 168 hours. No results for input(s): AMMONIA in the last 168 hours. Coagulation Profile: No results for input(s): INR, PROTIME in the last 168 hours. Cardiac Enzymes:  Recent Labs Lab 06/01/15 1230  CKTOTAL 45*   BNP (last 3 results) No results for input(s): PROBNP in the last 8760 hours. HbA1C: No results for input(s): HGBA1C in the last 72 hours. CBG:  Recent Labs Lab 06/02/15 1701 06/02/15 2044 06/03/15 0745 06/03/15 1149 06/03/15 1643  GLUCAP 240* 216* 127* 177* 307*   Lipid Profile: No results for input(s): CHOL, HDL, LDLCALC, TRIG, CHOLHDL, LDLDIRECT in the last 72 hours. Thyroid Function Tests: No results for input(s): TSH, T4TOTAL, FREET4, T3FREE, THYROIDAB in the last 72 hours. Anemia Panel: No results for input(s): VITAMINB12, FOLATE, FERRITIN, TIBC, IRON, RETICCTPCT in the last 72 hours. Urine analysis:    Component Value Date/Time   COLORURINE YELLOW 06/01/2015 [place] 06/01/2015 1540   LABSPEC 1.008 06/01/2015 1540   PHURINE 6.0 06/01/2015 1540   GLUCOSEU NEGATIVE 06/01/2015 1540   HGBUR TRACE* 06/01/2015 1540   BILIRUBINUR NEGATIVE 06/01/2015 1540   KETONESUR NEGATIVE 06/01/2015 1540   PROTEINUR 30* 06/01/2015 1540   UROBILINOGEN 1.0 01/16/2012 1902   NITRITE NEGATIVE 06/01/2015 1540   LEUKOCYTESUR NEGATIVE 06/01/2015 1540   Sepsis Labs: No results for input(s): PROCALCITON, LATICACIDVEN in the last 168 hours.  No results found for this or  any previous visit (from the past 240 hour(s)).       Radiology Studies: No results found.      Scheduled Meds: . feeding supplement (ENSURE ENLIVE)  237 mL Oral BID BM  . insulin aspart  0-15 Units Subcutaneous TID WC  . insulin aspart  0-5 Units Subcutaneous QHS  . isosorbide mononitrate  30 mg Oral Daily  . sodium bicarbonate  650 mg Oral TID  . sodium chloride flush  3 mL Intravenous Q12H   Continuous Infusions: . sodium chloride 125 mL/hr at 06/03/15 0213  . heparin 1,000 Units/hr (06/03/15 1721)     LOS: 2 days    Time spent: 25 minutes.     Hosie Poisson, MD [facility] Pager [phone]  If  7PM-7AM, please contact night-coverage [email or web address] Password [email or web address] 06/03/2015, 7:03 PM

## 2015-06-03 NOTE — Plan of Care (Signed)
Problem: Education: Goal: Knowledge of [facility] Education information/materials will improve Outcome: Completed/Met Date Met:  06/03/15 education provided regarding current plan of care. Pt is receiving blood, goals reviewed

## 2015-06-03 NOTE — Progress Notes (Signed)
[facility] ROUNDING NOTE   Subjective:   Interval History:  Renal function at little better this am   Receiving a blood transfusion at this time.   Objective:  Vital signs in last 24 hours:  Temp:  [98 F (36.7 C)-99.2 F (37.3 C)] 99.2 F (37.3 C) (05/14 1040) Pulse Rate:  [65-76] 66 (05/14 1040) Resp:  [17-20] 18 (05/14 1040) BP: (110-134)/(62-77) 134/76 mmHg (05/14 1040) SpO2:  [99 %-100 %] 100 % (05/14 1040)  Weight change:  Filed Weights   06/01/15 1229  Weight: 70.5 kg (155 lb 6.8 oz)    Intake/Output: I/O last 3 completed shifts: In: [id number] [P.O.:720; I.V.:3810] Out: 900 [Urine:900]   Intake/Output this shift:     General: Alert, Well-developed, somewhat thin  Head: Normocephalic and atraumatic. Neck: Supple; no masses or thyromegaly. JVP not elevated Lungs: Clear throughout to auscultation. No wheezes, crackles, or rhonchi. No acute distress. Heart: Regular rate and rhythm; no murmurs, clicks, rubs, or gallops. Abdomen: Soft, nontender and nondistended. No masses, hepatosplenomegaly or hernias noted. Normal bowel sounds, without guarding, and without rebound.  Msk: Symmetrical without gross deformities. Normal posture. Pulses: No carotid, renal, femoral bruits. DP and PT symmetrical and equal Extremities: Without clubbing or edema.   Basic Metabolic Panel:  Recent Labs Lab 05/28/15 0849 06/01/15 0744 06/02/15 0500 06/02/15 1130 06/03/15 0935  NA 139 134* 135  --  136  K 3.7 3.9 5.1  --  4.2  CL  --   --  101  --  107  CO2 28 22 20*  --  16*  GLUCOSE 133 94 235*  --  145*  BUN 25.1 49.7* 63*  --  65*  CREATININE 3.3 Repeated and Verified* 10.6* 10.71*  --  9.48*  CALCIUM 10.0 8.6 7.7*  --  7.1*  MG  --   --   --  1.2* 1.9    Liver Function Tests:  Recent Labs Lab 05/28/15 0849 06/01/15 0744  AST 25 30  ALT 11 9  ALKPHOS 48 49  BILITOT 1.39* 1.35*  PROT 7.7 7.2  ALBUMIN 2.8* 2.7*   No results for input(s):  LIPASE, AMYLASE in the last 168 hours. No results for input(s): AMMONIA in the last 168 hours.  CBC:  Recent Labs Lab 05/28/15 0849 06/01/15 1405 06/02/15 0500 06/02/15 1130 06/03/15 0131  WBC 2.1* 2.7* 2.8*  --  2.8*  NEUTROABS 1.9  --   --   --   --   HGB 8.5* 8.1* 6.7* 7.1* 6.1*  HCT 26.8* 24.4* 19.9* 21.0* 17.8*  MCV 82.5 79.2 76.5*  --  78.1  PLT 79* 101* 100*  --  90*    Cardiac Enzymes:  Recent Labs Lab 06/01/15 1230  CKTOTAL 45*    BNP: Invalid input(s): POCBNP  CBG:  Recent Labs Lab 06/02/15 0757 06/02/15 1157 06/02/15 1701 06/02/15 2044 06/03/15 0745  GLUCAP 208* 238* 240* 13* 41*    Microbiology: Results for orders placed or performed in visit on 05/21/15  TECHNOLOGIST REVIEW     Status: None   Collection Time: 05/21/15  8:38 AM  Result Value Ref Range Status   Technologist Review Few Metas and Myelocytes present and few helmets  Final   *Note: Due to a large number of results and/or encounters for the requested time period, some results have not been displayed. A complete set of results can be found in Results Review.    Coagulation Studies: No results for input(s): LABPROT, INR in the  last 72 hours.  Urinalysis:  Recent Labs  06/01/15 1540  COLORURINE YELLOW  LABSPEC 1.008  PHURINE 6.0  GLUCOSEU NEGATIVE  HGBUR TRACE*  BILIRUBINUR NEGATIVE  KETONESUR NEGATIVE  PROTEINUR 30*  NITRITE NEGATIVE  LEUKOCYTESUR NEGATIVE      Imaging: US Renal  06/01/2015  CLINICAL DATA:  Acute kidney injury, diabetes mellitus, hypertension and EXAM: RENAL / URINARY TRACT ULTRASOUND COMPLETE COMPARISON:  Abdominal ultrasound 03/13/2015 FINDINGS: Right Kidney: Length: 11.8 cm. Normal cortical thickness. Increased cortical echogenicity. No hydronephrosis or shadowing calcification. Two cysts identified at mid RIGHT kidney 1.6 x 1.4 x 1.5 cm and at the mid inferior pole 2.9 x 2.5 x 2.4 cm, smaller lesion demonstrating scattered internal  echogenicity/complication. Left Kidney: Length: 13.9 cm. Normal cortical thickness. Increased cortical echogenicity. No mass, hydronephrosis or shadowing calcification. Bladder: Partially distended, unremarkable. IMPRESSION: Medical renal disease changes of both kidneys. 2 RIGHT renal cysts, larger 2.9 cm greatest size simple in character with smaller lesion 1.6 cm greatest size complicated by scattered internal echogenicity. Either followup ultrasound in 4-6 months to demonstrate stability or characterization by followup non emergent MRI after discharge when patient can optimally cooperate with MR imaging recommended to exclude cystic renal neoplasm. Electronically Signed   By: Lavonia Dana M.D.   On: 06/01/2015 13:46     Medications:   . sodium chloride 125 mL/hr at 06/03/15 0213  . heparin 1,000 Units/hr (06/02/15 1433)   . feeding supplement (ENSURE ENLIVE)  237 mL Oral BID BM  . insulin aspart  0-15 Units Subcutaneous TID WC  . insulin aspart  0-5 Units Subcutaneous QHS  . isosorbide mononitrate  30 mg Oral Daily  . sodium chloride flush  3 mL Intravenous Q12H   acetaminophen **OR** acetaminophen, morphine injection, ondansetron **OR** ondansetron (ZOFRAN) IV, oxyCODONE, sodium chloride flush  Assessment/ Plan:   Acute on chronic renal failure -- etiology could be drug induced renal failure although it does not appear that valcade or daratumamab cause acute renal failure. Renal Ultrasound was negative for obstruction and urinalysis was also unremarkable SPEP pending although this may be from myeloma -- renal biopsy may be helpful  Renal function better this am  Uremia Some of his symptoms could be from uremia  Hypertension stopped ACE inhibitor  Metabolic Acidosis -- will add sodium bicarbonate 650 mg TID   We shall continue to monitor renal function over the weekend   LOS: 2 Eddie Thomas @TODAY @11 :04 AM

## 2015-06-03 NOTE — Progress Notes (Signed)
Received report from Ashley. I agree with pm assessment. Will continue to follow poc

## 2015-06-03 NOTE — Progress Notes (Signed)
CRITICAL VALUE ALERT  Critical value received:  hgb 6.1  Date of notification:  5/14  Time of notification:  0148  Critical value read back:Yes.    Nurse who received alert:  Virgina Norfolk  MD notified (1st page):  Schorr  Time of first page:  [phone]  MD notified (2nd page):  Time of second page:  Responding MD:  Schorr  Time MD responded:  [phone]

## 2015-06-03 NOTE — Progress Notes (Signed)
ANTICOAGULATION CONSULT NOTE - Follow Up Consult  Pharmacy Consult for Heparin Indication: Hx of DVT (rivaroxaban held for renal failure)  No Known Allergies  Patient Measurements: Height: 5\' 11"  (180.3 cm) Weight: 155 lb 6.8 oz (70.5 kg) IBW/kg (Calculated) : 75.3 Heparin Dosing Weight:   Vital Signs: Temp: 98.1 F (36.7 C) (05/14 2048) Temp Source: Oral (05/14 2048) BP: 151/77 mmHg (05/14 2048) Pulse Rate: 57 (05/14 2048)  Labs:  Recent Labs  06/01/15 0744 06/01/15 1230  06/01/15 1405 06/01/15 2200 06/02/15 0500  06/02/15 0927 06/02/15 1130 06/02/15 1808 06/03/15 0131 06/03/15 0935 06/03/15 2000  HGB  --   --   < > 8.1*  --  6.7*  --   --  7.1*  --  6.1*  --  7.8*  HCT  --   --   < > 24.4*  --  19.9*  --   --  21.0*  --  17.8*  --  22.4*  PLT  --   --   < > 101*  --  100*  --   --   --   --  90*  --  92*  APTT  --   --   --  29 81*  --   --  145*  --   --   --   --   --   HEPARINUNFRC  --   --   < > <0.10* 0.30  --   < >  --   --  0.60 0.67  --  0.79*  CREATININE 10.6*  --   --   --   --  10.71*  --   --   --   --   --  9.48*  --   CKTOTAL  --  45*  --   --   --   --   --   --   --   --   --   --   --   < > = values in this interval not displayed.  Estimated Creatinine Clearance: 7.3 mL/min (by C-G formula based on Cr of 9.48).   Medications:  Infusions:  . sodium chloride 125 mL/hr at 06/03/15 1959  . heparin 1,000 Units/hr (06/03/15 1721)    Assessment: Heparin level has trended up on 1000units/hr, now SUPRAtherapeutic.  CBC a little better. Continues to have slightly oozing at the IV site per RN.  Goal of Therapy:  Heparin level 0.3-0.7 units/ml Monitor platelets by anticoagulation protocol: Yes   Plan:  Decrease heparin drip to 850units/hr. Recheck heparin level with AM labs.   Romeo Rabon, PharmD, pager [phone]. 06/03/2015,9:12 PM.

## 2015-06-04 ENCOUNTER — Ambulatory Visit: Payer: Medicare Other | Admitting: Internal Medicine

## 2015-06-04 ENCOUNTER — Other Ambulatory Visit: Payer: Medicare Other

## 2015-06-04 ENCOUNTER — Inpatient Hospital Stay: Payer: Medicare Other

## 2015-06-04 ENCOUNTER — Ambulatory Visit: Payer: Medicare Other

## 2015-06-04 ENCOUNTER — Encounter: Payer: Medicare Other | Admitting: Nutrition

## 2015-06-04 LAB — HEPARIN LEVEL (UNFRACTIONATED)
Heparin Unfractionated: 0.72 IU/mL — ABNORMAL HIGH (ref 0.30–0.70)
Heparin Unfractionated: 0.43 IU/mL (ref 0.30–0.70)

## 2015-06-04 LAB — CBC
HCT: 22.7 % — ABNORMAL LOW (ref 39.0–52.0)
HEMOGLOBIN: 7.8 g/dL — AB (ref 13.0–17.0)
MCH: 26.8 pg (ref 26.0–34.0)
MCHC: 34.4 g/dL (ref 30.0–36.0)
MCV: 78 fL (ref 78.0–100.0)
Platelets: 92 10*3/uL — ABNORMAL LOW (ref 150–400)
RBC: 2.91 MIL/uL — AB (ref 4.22–5.81)
RDW: 15.7 % — ABNORMAL HIGH (ref 11.5–15.5)
WBC: 2.9 10*3/uL — ABNORMAL LOW (ref 4.0–10.5)

## 2015-06-04 LAB — BASIC METABOLIC PANEL
Anion gap: 10 (ref 5–15)
BUN: 81 mg/dL — AB (ref 6–20)
CHLORIDE: 110 mmol/L (ref 101–111)
CO2: 18 mmol/L — ABNORMAL LOW (ref 22–32)
CREATININE: 9.44 mg/dL — AB (ref 0.61–1.24)
Calcium: 7.1 mg/dL — ABNORMAL LOW (ref 8.9–10.3)
GFR calc Af Amer: 6 mL/min — ABNORMAL LOW (ref >60)
GFR calc non Af Amer: 5 mL/min — ABNORMAL LOW (ref >60)
GLUCOSE: 138 mg/dL — AB (ref 65–99)
POTASSIUM: 5.2 mmol/L — AB (ref 3.5–5.1)
Sodium: 138 mmol/L (ref 135–145)

## 2015-06-04 LAB — GLUCOSE, CAPILLARY
GLUCOSE-CAPILLARY: 250 mg/dL — AB (ref 65–99)
GLUCOSE-CAPILLARY: 236 mg/dL — AB (ref 65–99)
Glucose-Capillary: 115 mg/dL — ABNORMAL HIGH (ref 65–99)
Glucose-Capillary: 151 mg/dL — ABNORMAL HIGH (ref 65–99)
Glucose-Capillary: 67 mg/dL (ref 65–99)

## 2015-06-04 LAB — TYPE AND SCREEN
ABO/RH(D): AB POS
Antibody Screen: POSITIVE
DAT, IgG: NEGATIVE
Unit division: 0
Unit division: 0

## 2015-06-04 LAB — PROTEIN ELECTROPHORESIS, SERUM
A/G RATIO SPE: 0.7 (ref 0.7–1.7)
ALBUMIN ELP: 3 g/dL (ref 2.9–4.4)
ALPHA-2-GLOBULIN: 0.6 g/dL (ref 0.4–1.0)
Alpha-1-Globulin: 0.3 g/dL (ref 0.0–0.4)
BETA GLOBULIN: 2 g/dL — AB (ref 0.7–1.3)
GAMMA GLOBULIN: 1.3 g/dL (ref 0.4–1.8)
Globulin, Total: 4.2 g/dL — ABNORMAL HIGH (ref 2.2–3.9)
M-Spike, %: 1.1 g/dL — ABNORMAL HIGH
Total Protein ELP: 7.2 g/dL (ref 6.0–8.5)

## 2015-06-04 MED ORDER — HEPARIN (PORCINE) IN NACL 100-0.45 UNIT/ML-% IJ SOLN
900.0000 [IU]/h | INTRAMUSCULAR | Status: DC
  Administered 2015-06-04 – 2015-06-06 (×3): 750 [IU]/h via INTRAVENOUS
  Filled 2015-06-04 (×2): qty 250

## 2015-06-04 MED ORDER — CEFAZOLIN SODIUM-DEXTROSE 2-4 GM/100ML-% IV SOLN
2.0000 g | INTRAVENOUS | Status: AC
  Administered 2015-06-05: 2 g via INTRAVENOUS
  Filled 2015-06-04: qty 100

## 2015-06-04 MED ORDER — SODIUM CHLORIDE 0.9 % IV SOLN: 100.0000 mL | INTRAVENOUS | Status: DC | PRN

## 2015-06-04 MED ORDER — SODIUM CHLORIDE 0.9 % IV SOLN
100.0000 mL | INTRAVENOUS | Status: DC | PRN
Start: 2015-06-04 — End: 2015-06-05

## 2015-06-04 MED ORDER — ALTEPLASE 2 MG IJ SOLR: 2.0000 mg | Freq: Once | INTRAMUSCULAR | Status: DC | PRN

## 2015-06-04 MED ORDER — PENTAFLUOROPROP-TETRAFLUOROETH EX AERO: 1.0000 "application " | INHALATION_SPRAY | CUTANEOUS | Status: DC | PRN

## 2015-06-04 MED ORDER — HEPARIN SODIUM (PORCINE) 1000 UNIT/ML DIALYSIS: 2000.0000 [IU] | INTRAMUSCULAR | Status: DC | PRN

## 2015-06-04 MED ORDER — LIDOCAINE-PRILOCAINE 2.5-2.5 % EX CREA: 1.0000 "application " | TOPICAL_CREAM | CUTANEOUS | Status: DC | PRN

## 2015-06-04 MED ORDER — HEPARIN SODIUM (PORCINE) 1000 UNIT/ML DIALYSIS: 1000.0000 [IU] | INTRAMUSCULAR | Status: DC | PRN

## 2015-06-04 MED ORDER — SODIUM POLYSTYRENE SULFONATE 15 GM/60ML PO SUSP
15.0000 g | Freq: Once | ORAL | Status: AC
  Administered 2015-06-04: 15 g via ORAL
  Filled 2015-06-04: qty 60

## 2015-06-04 MED ORDER — LIDOCAINE HCL (PF) 1 % IJ SOLN: 5.0000 mL | INTRAMUSCULAR | Status: DC | PRN

## 2015-06-04 NOTE — Progress Notes (Signed)
[facility] ROUNDING NOTE   Subjective:   Interval History:  No change.  Tired and anorexia.  No n/v or confusion.   Objective:  Vital signs in last 24 hours:  Temp:  [98.1 F (36.7 C)-99.1 F (37.3 C)] 99.1 F (37.3 C) (05/15 0641) Pulse Rate:  [57-74] 66 (05/15 0641) Resp:  [16-18] 16 (05/15 0641) BP: (136-173)/(76-91) 156/88 mmHg (05/15 0641) SpO2:  [99 %-100 %] 100 % (05/15 0641)  Weight change:  Filed Weights   06/01/15 1229  Weight: 70.5 kg (155 lb 6.8 oz)    Intake/Output: I/O last 3 completed shifts: In: 4325.2 [P.O.:600; I.V.:3109.2; Blood:616] Out: [id number]   Intake/Output this shift:  Total I/O In: 10 [I.V.:10] Out: -   General: Alert, Well-developed, somewhat thin  Head: Normocephalic and atraumatic. Neck: Supple; no masses or thyromegaly. JVP not elevated Lungs: Clear throughout to auscultation. No wheezes, crackles, or rhonchi. No acute distress. Heart: Regular rate and rhythm; no murmurs, clicks, rubs, or gallops. Abdomen: Soft, nontender and nondistended. No masses, hepatosplenomegaly or hernias noted. Normal bowel sounds, without guarding, and without rebound.  Msk: Symmetrical without gross deformities. Normal posture. Pulses: No carotid, renal, femoral bruits. DP and PT symmetrical and equal Extremities: Without clubbing or edema.   Basic Metabolic Panel:  Recent Labs Lab 06/01/15 0744 06/02/15 0500 06/02/15 1130 06/03/15 0935 06/04/15 0600  NA 134* 135  --  136 138  K 3.9 5.1  --  4.2 5.2*  CL  --  101  --  107 110  CO2 22 20*  --  16* 18*  GLUCOSE 94 235*  --  145* 138*  BUN 49.7* 63*  --  65* 81*  CREATININE 10.6* 10.71*  --  9.48* 9.44*  CALCIUM 8.6 7.7*  --  7.1* 7.1*  MG  --   --  1.2* 1.9  --     Liver Function Tests:  Recent Labs Lab 06/01/15 0744  AST 30  ALT 9  ALKPHOS 49  BILITOT 1.35*  PROT 7.2  ALBUMIN 2.7*   No results for input(s): LIPASE, AMYLASE in the last 168  hours. No results for input(s): AMMONIA in the last 168 hours.  CBC:  Recent Labs Lab 06/01/15 1405 06/02/15 0500 06/02/15 1130 06/03/15 0131 06/03/15 2000 06/04/15 0600  WBC 2.7* 2.8*  --  2.8* 2.7* 2.9*  HGB 8.1* 6.7* 7.1* 6.1* 7.8* 7.8*  HCT 24.4* 19.9* 21.0* 17.8* 22.4* 22.7*  MCV 79.2 76.5*  --  78.1 78.6 78.0  PLT 101* 100*  --  90* 92* 92*    Cardiac Enzymes:  Recent Labs Lab 06/01/15 1230  CKTOTAL 45*    BNP: Invalid input(s): POCBNP  CBG:  Recent Labs Lab 06/03/15 1149 06/03/15 1643 06/03/15 2051 06/04/15 0802 06/04/15 1209  GLUCAP 177* 307* 274* 115* 250*    Microbiology: Results for orders placed or performed in visit on 05/21/15  TECHNOLOGIST REVIEW     Status: None   Collection Time: 05/21/15  8:38 AM  Result Value Ref Range Status   Technologist Review Few Metas and Myelocytes present and few helmets  Final   *Note: Due to a large number of results and/or encounters for the requested time period, some results have not been displayed. A complete set of results can be found in Results Review.    Coagulation Studies: No results for input(s): LABPROT, INR in the last 72 hours.  Urinalysis:  Recent Labs  06/01/15 [place]  LABSPEC 1.008  PHURINE  6.0  GLUCOSEU NEGATIVE  HGBUR TRACE*  BILIRUBINUR NEGATIVE  KETONESUR NEGATIVE  PROTEINUR 30*  NITRITE NEGATIVE  LEUKOCYTESUR NEGATIVE      Imaging: No results found.   Medications:   . sodium chloride 125 mL (06/04/15 1254)  . heparin 750 Units/hr (06/04/15 [id number])   . feeding supplement (ENSURE ENLIVE)  237 mL Oral BID BM  . insulin aspart  0-15 Units Subcutaneous TID WC  . insulin aspart  0-5 Units Subcutaneous QHS  . isosorbide mononitrate  30 mg Oral Daily  . sodium bicarbonate  650 mg Oral TID  . sodium chloride flush  3 mL Intravenous Q12H   acetaminophen **OR** acetaminophen, morphine injection, ondansetron **OR** ondansetron (ZOFRAN) IV, oxyCODONE, sodium  chloride flush  Assessment/ Plan:  1.  Acute/ subacute renal failure - creat started to rise in March-Apr 2017 up to 1.4- 1.8 range w some bumps up to 2.3- 2.5.  On May 8th creat was 3.3 and now admitted with creat of 10.   Patient has myeloma refractory to multiple different chemo combinations, disease onset was 2011, had SCT in 2012.  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  Don't think with his refractory disease that chemo is going to help and doubt that pheresis in this setting will be of much help either.  Recommend supportive care w dialysis. Have d/w patient, he agrees to proceed.      2.  Hypertension stopped ACE inhibitor 3.  Metabolic Acidosis -- added sodium bicarbonate 650 mg TID    Kelly Splinter MD [facility] pager [phone]    cell [phone] 06/04/2015, 3:13 PM

## 2015-06-04 NOTE — Progress Notes (Signed)
PHARMACIST - PHYSICIAN COMMUNICATION CONCERNING:  IV Heparin  In brief, 69 yoM on IV heparin for hx DVT (previously on xarelto - held for acute on chronic renal failure).  Please see note written earlier by Peggyann Juba, PharmD, for full details.    IV heparin currently at 750 units/hr (=7.13ml/hr) and heparin level is now therapeutic @ 0.43 (goal 0.3-0.7).  RN states since changing dressing at IV site, no further oozing or bleeding noted.  No infusion site issues or other bleeding.  [facility].     RECOMMENDATION: Continue IV heparin at 750 units/hr = 7.5 ml/hr.  Check repeat heparin level in 8 hours.    Ralene Bathe, PharmD, BCPS 06/04/2015, 4:30 PM  Pager: [phone]

## 2015-06-04 NOTE — Progress Notes (Signed)
Patient transferred to [facility] from [facility] via [facility].  Alert and oriented.  Skin assessment completed with Brett Fairy DeShazo, RN.  Bruise noted on left, lower back; otherwise, skin intact.  No complaints of pain currently.  Placed on telemetry box 6e01 and verified with above named Therapist, sports.  Patient educated on NPO status at midnight due to procedure tomorrow and verbalized consent.  Will continue to monitor.

## 2015-06-04 NOTE — Progress Notes (Addendum)
PROGRESS NOTE    KOLTYN KELSAY  WKM:[id number] DOB: 18-Mar-1945 DOA: 06/01/2015 PCP: [name]  Outpatient Specialists: Oncology dr Julien Nordmann.     Brief Narrative: Eddie Thomas is a 70 y.o. male with medical history significant of multiple myeloma, IgA subtype diagnosed in 201, currently undergoing treatment with Velcade/Daratumumab at the cancer center, who follows Dr. Julien Nordmann of medical oncology, also having a history of chronic kidney disease (with baseline creatinine near 1.6-2.0), hypertension, diabetes mellitus, presented as a transfer from the cancer center. He presented to the cancer today for follow-up visit, where he had reported having generalized weakness, fatigue,  intolerance to physical exertion, . He was found to be acute renal failure. Renal consulted and recommendations given.   Assessment & Plan:   Principal Problem:   Acute on chronic renal failure (HCC) Active Problems:   Multiple myeloma (HCC)   Hypertension   Bone marrow transplant complication (HCC)   Dehydration   Hypoalbuminemia due to protein-calorie malnutrition (HCC)   Antineoplastic chemotherapy induced pancytopenia (HCC)   Long term current use of anticoagulant therapy   Acute renal failure (ARF) (HCC)   Acute renal failure: Probably drug induced vs from multiple myeloma vs from volume depletion.  Obstructive causes ruled out.  Hydration and if renal function does not improve,he will probably need a renal biopsy and transfer to [facility]. planto transfer to [place] and get IV access for HD in the next few days.  renal on board and appreciate recommendations.    Generalized weakness from the above.   15 beats oF vtach: Low mag on lab work, IV mag ordered. Monitor. Normal levels in am.   Multiple Myeloma: Follows with Dr Julien Nordmann.   ANEMIA:  secondary to multiple myeloma and ESRD.  Ordered 2 units of prbc and repeat in am shows improvement to 7.8  Hyperkalemia: Kayexalate given,  repeat BMP in am.         DVT prophylaxis: (heparin). Code Status: (Full Family Communication: none at bedside.  Disposition Plan: pending further investigation.    Consultants:   Renal.  Oncology  IR   Procedures: US renal     Antimicrobials: none   Subjective: Feeling okay , no new complaints.   Objective: Filed Vitals:   06/03/15 1540 06/03/15 1750 06/03/15 2048 06/04/15 0641  BP: 148/79 173/91 151/77 156/88  Pulse: 73 71 57 66  Temp: 98.4 F (36.9 C) 98.7 F (37.1 C) 98.1 F (36.7 C) 99.1 F (37.3 C)  TempSrc: Oral Oral Oral Oral  Resp: 18 16 18 16   Height:      Weight:      SpO2: 99% 100% 99% 100%    Intake/Output Summary (Last 24 hours) at 06/04/15 1849 Last data filed at 06/04/15 1601  Gross per 24 hour  Intake 1904.17 ml  Output    325 ml  Net 1579.17 ml   Filed Weights   06/01/15 1229  Weight: 70.5 kg (155 lb 6.8 oz)    Examination:  General exam: Appears calm and comfortable  Respiratory system: Clear to auscultation. Respiratory effort normal. Cardiovascular system: S1 & S2 heard, RRR. No JVD, murmurs, rubs, gallops or clicks. No pedal edema. Gastrointestinal system: Abdomen is nondistended, soft and nontender. No organomegaly or masses felt. Normal bowel sounds heard. Central nervous system: Alert and oriented. No focal neurological deficits. Extremities: Symmetric 5 x 5 power. Skin: No rashes, lesions or ulcers Psychiatry: Judgement and insight appear normal. Mood & affect appropriate.     Data Reviewed:  I have personally reviewed following labs and imaging studies  CBC:  Recent Labs Lab 06/01/15 1405 06/02/15 0500 06/02/15 1130 06/03/15 0131 06/03/15 2000 06/04/15 0600  WBC 2.7* 2.8*  --  2.8* 2.7* 2.9*  HGB 8.1* 6.7* 7.1* 6.1* 7.8* 7.8*  HCT 24.4* 19.9* 21.0* 17.8* 22.4* 22.7*  MCV 79.2 76.5*  --  78.1 78.6 78.0  PLT 101* 100*  --  90* 92* 92*   Basic Metabolic Panel:  Recent Labs Lab 06/01/15 0744  06/02/15 0500 06/02/15 1130 06/03/15 0935 06/04/15 0600  NA 134* 135  --  136 138  K 3.9 5.1  --  4.2 5.2*  CL  --  101  --  107 110  CO2 22 20*  --  16* 18*  GLUCOSE 94 235*  --  145* 138*  BUN 49.7* 63*  --  65* 81*  CREATININE 10.6* 10.71*  --  9.48* 9.44*  CALCIUM 8.6 7.7*  --  7.1* 7.1*  MG  --   --  1.2* 1.9  --    GFR: Estimated Creatinine Clearance: 7.4 mL/min (by C-G formula based on Cr of 9.44). Liver Function Tests:  Recent Labs Lab 06/01/15 0744  AST 30  ALT 9  ALKPHOS 49  BILITOT 1.35*  PROT 7.2  ALBUMIN 2.7*   No results for input(s): LIPASE, AMYLASE in the last 168 hours. No results for input(s): AMMONIA in the last 168 hours. Coagulation Profile: No results for input(s): INR, PROTIME in the last 168 hours. Cardiac Enzymes:  Recent Labs Lab 06/01/15 1230  CKTOTAL 45*   BNP (last 3 results) No results for input(s): PROBNP in the last 8760 hours. HbA1C: No results for input(s): HGBA1C in the last 72 hours. CBG:  Recent Labs Lab 06/03/15 1643 06/03/15 2051 06/04/15 0802 06/04/15 1209 06/04/15 1707  GLUCAP 307* 274* 115* 250* 236*   Lipid Profile: No results for input(s): CHOL, HDL, LDLCALC, TRIG, CHOLHDL, LDLDIRECT in the last 72 hours. Thyroid Function Tests: No results for input(s): TSH, T4TOTAL, FREET4, T3FREE, THYROIDAB in the last 72 hours. Anemia Panel: No results for input(s): VITAMINB12, FOLATE, FERRITIN, TIBC, IRON, RETICCTPCT in the last 72 hours. Urine analysis:    Component Value Date/Time   COLORURINE YELLOW 06/01/2015 [place] 06/01/2015 1540   LABSPEC 1.008 06/01/2015 1540   PHURINE 6.0 06/01/2015 1540   GLUCOSEU NEGATIVE 06/01/2015 1540   HGBUR TRACE* 06/01/2015 1540   BILIRUBINUR NEGATIVE 06/01/2015 1540   KETONESUR NEGATIVE 06/01/2015 1540   PROTEINUR 30* 06/01/2015 1540   UROBILINOGEN 1.0 01/16/2012 1902   NITRITE NEGATIVE 06/01/2015 1540   LEUKOCYTESUR NEGATIVE 06/01/2015 1540   Sepsis  Labs: No results for input(s): PROCALCITON, LATICACIDVEN in the last 168 hours.  No results found for this or any previous visit (from the past 240 hour(s)).       Radiology Studies: No results found.      Scheduled Meds: . [START ON 06/05/2015]  ceFAZolin (ANCEF) IV  2 g Intravenous to XRAY  . feeding supplement (ENSURE ENLIVE)  237 mL Oral BID BM  . insulin aspart  0-15 Units Subcutaneous TID WC  . insulin aspart  0-5 Units Subcutaneous QHS  . isosorbide mononitrate  30 mg Oral Daily  . sodium bicarbonate  650 mg Oral TID  . sodium chloride flush  3 mL Intravenous Q12H   Continuous Infusions: . sodium chloride 50 mL (06/04/15 1600)  . heparin 750 Units/hr (06/04/15 0808)     LOS: 3 days  Time spent: 25 minutes.     Hosie Poisson, MD [facility] Pager [phone]  If 7PM-7AM, please contact night-coverage [email or web address] Password [facility] 06/04/2015, 6:49 PM

## 2015-06-04 NOTE — Progress Notes (Signed)
[facility] Spiritual Care Note  Followed up with Eddie Thomas inpt today to provide further emotional support, opportunity to process recent health changes, and to bring greetings of care and encouragement from the [facility] Support Team (whom he knows from 1:1 appointments and participation in Multiple Myeloma and Living With Cancer support groups).  Eddie Thomas used opportunity to reflect on hopes, concerns, and gratitude for support from family in the midst of several stressors (including his wife's recovery through recent serious injuries).  Eddie Thomas has used [employer] to help with coping and meaning-making and is aware of ongoing Team availability.  Following, but please also page if needs arise/circumstances change.  Thank you.  Level [place], [place], [facility] M-F daytime pager [phone] [facility] 24/7 pager [phone]

## 2015-06-04 NOTE — Progress Notes (Addendum)
ANTICOAGULATION CONSULT NOTE - Follow Up Consult  Pharmacy Consult for Heparin Indication: Hx of DVT (rivaroxaban held for renal failure)  No Known Allergies  Patient Measurements: Height: 5\' 11"  (180.3 cm) Weight: 155 lb 6.8 oz (70.5 kg) IBW/kg (Calculated) : 75.3 Heparin Dosing Weight:   Vital Signs: Temp: 99.1 F (37.3 C) (05/15 0641) Temp Source: Oral (05/15 0641) BP: 156/88 mmHg (05/15 0641) Pulse Rate: 66 (05/15 0641)  Labs:  Recent Labs  06/01/15 1230  06/01/15 1405 06/01/15 2200 06/02/15 0500  06/02/15 0927  06/03/15 0131 06/03/15 0935 06/03/15 2000 06/04/15 0600  HGB  --   < > 8.1*  --  6.7*  --   --   < > 6.1*  --  7.8* 7.8*  HCT  --   < > 24.4*  --  19.9*  --   --   < > 17.8*  --  22.4* 22.7*  PLT  --   < > 101*  --  100*  --   --   --  90*  --  92* 92*  APTT  --   --  29 81*  --   --  145*  --   --   --   --   --   HEPARINUNFRC  --   < > <0.10* 0.30  --   < >  --   < > 0.67  --  0.79* 0.72*  CREATININE  --   --   --   --  10.71*  --   --   --   --  9.48*  --  9.44*  CKTOTAL 45*  --   --   --   --   --   --   --   --   --   --   --   < > = values in this interval not displayed.  Estimated Creatinine Clearance: 7.4 mL/min (by C-G formula based on Cr of 9.44).   Medications:  Infusions:  . sodium chloride 125 mL/hr at 06/03/15 1959  . heparin 850 Units/hr (06/04/15 0514)    Assessment: 7 yoM admitted on 5/12 with acute on CKD (baseline SCr 1.6). 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 Rivaroxaban is held for acute renal failure and Pharmacy is consulted to dose Heparin.  PTA Xarelto 20mg  daily. Last dose taken on 5/10 at unknown time (originally reported as last dose on 5/11 at 2330)  Today, 06/04/2015  Heparin level remains SUPRAtherapeutic on 850 units/hr.   Hgb slightly improved after transfusion yesterday; Plts remain low.  Oozing at IV site continues  per RN, dressing to be changed this AM.  SCr remains elevated, Nephrology following.  Goal of Therapy:  Heparin level 0.3-0.7 units/ml Monitor platelets by anticoagulation protocol: Yes   Plan:   Decrease heparin to 750 units/hr  Recheck heparin level in 8hrs  Daily heparin level and CBC  Peggyann Juba, PharmD, BCPS Pager: [phone] 06/04/2015,7:24 AM.

## 2015-06-04 NOTE — Care Management Important Message (Signed)
Important Message  Patient Details  Name: Eddie Thomas MRN: [id number] Date of Birth: 1945/03/15   Medicare Important Message Given:  Yes    Camillo Flaming 06/04/2015, 11:08 AMImportant Message  Patient Details  Name: Eddie Thomas MRN: [id number] Date of Birth: September 27, 1945   Medicare Important Message Given:  Yes    Camillo Flaming 06/04/2015, 11:08 AM

## 2015-06-04 NOTE — Progress Notes (Signed)
CHART NOTE  The patient is seen today. He was recently admitted to [facility] with acute on chronic renal failure. This is most likely secondary to disease or disease progression or drug induced nephropathy. Dehydration and anemia could be also contributing factor. His serum creatinine improved over the last few days with IV hydration but is still significantly elevated. He was seen by nephrology and expected to undergo hemodialysis soon and the patient will be transferred to [facility]. Serum protein electrophoresis is still pending. If there is any evidence for disease progression, we'll consider discontinuing his current treatment regimen and consider the patient for other regimens in the future after improvement of his kidney function. Thank you so much for taking good care of Eddie Thomas. I will arrange a follow-up appointment for the patient after discharge from the hospital for follow-up visit and more detailed discussion of his future treatment options.

## 2015-06-04 NOTE — Progress Notes (Signed)
Patient ID: Eddie Thomas, male   DOB: [birth year], 70 y.o.   MRN: [id number]    Referring Physician(s): Schertz,Robert  Supervising Physician: Daryll Brod  Patient Status: In-pt  Chief Complaint:  Acute on chronic renal failure, refractory multiple myeloma  Subjective: Patient familiar to IR service from prior right chest wall Port-A-Cath placed 06/03/2013 and subsequently removed on 06/20/14 secondary to infection. He then underwent left chest wall Port-A-Cath placement on 08/11/14. He was originally diagnosed with multiple myeloma in 2011 and has received various chemotherapeutic regimens since then but is currently refractory to treatment. He underwent stem cell transplant 2012. Past medical history also significant for left lower extremity DVT diagnosed in June 2016 previously on outpatient Xarelto, currently on IV heparin. Also with history of hypertension and diabetes. He was recently admitted with decreased appetite, nausea, fatigue and acute on chronic renal failure. 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 Patient awaiting transfer to [facility] for dialysis. He currently denies fever, headache, chest pain, cough, abdominal/back pain, nausea /vomiting or abnormal bleeding other than some occasional bleeding from external hemorrhoids.   Allergies: Review of patient's allergies indicates no known allergies.  Medications: Prior to Admission medications   Medication Sig Start Date End Date Taking? Authorizing Provider  Cholecalciferol (VITAMIN D-3) 5000 UNITS TABS Take 1 tablet by mouth daily.   Yes Historical Provider, MD  fluconazole (DIFLUCAN) 100 MG tablet Take 1 tablet (100 mg total) by mouth daily. 05/30/15  Yes Curt Bears, MD  gabapentin (NEURONTIN) 300 MG capsule Take 300 mg by mouth 3 (three) times daily as needed (pain). Reported on 04/25/2015   Yes Historical  Provider, MD  lidocaine-prilocaine (EMLA) cream Apply 1 application topically as needed. Apply one hour before use and cover with plastic wrap. 10/17/14  Yes Adrena E Johnson, PA-C  lisinopril-hydrochlorothiazide (PRINZIDE,ZESTORETIC) 20-12.5 MG tablet Take 1 tablet by mouth daily.    Yes Historical Provider, MD  oxyCODONE-acetaminophen (PERCOCET/ROXICET) 5-325 MG tablet Take 1 tablet by mouth every 6 (six) hours as needed for severe pain. 01/24/15  Yes Curt Bears, MD  pantoprazole (PROTONIX) 40 MG tablet TAKE ONE TABLET BY MOUTH TWICE DAILY 02/02/15  Yes Ladene Artist, MD  prochlorperazine (COMPAZINE) 10 MG tablet Take 10 mg by mouth every 6 (six) hours as needed for nausea or vomiting. Reported on 05/14/2015   Yes Historical Provider, MD  simvastatin (ZOCOR) 10 MG tablet Take 10 mg by mouth at bedtime.     Yes Historical Provider, MD  temazepam (RESTORIL) 15 MG capsule Take 15 mg by mouth at bedtime as needed for sleep. Reported on 01/16/2015   Yes Historical Provider, MD  valACYclovir (VALTREX) 500 MG tablet TAKE ONE CAPLET BY MOUTH ONCE DAILY 01/24/15  Yes Curt Bears, MD  VIAGRA 50 MG tablet Take 50 mg by mouth daily as needed for erectile dysfunction.  02/23/13  Yes Historical Provider, MD  XARELTO 20 MG TABS tablet TAKE ONE TABLET BY MOUTH ONCE DAILY WITH  SUPPER 05/04/15  Yes Curt Bears, MD  dexamethasone (DECADRON) 4 MG tablet 5 tab po on days 1,2,4,5,8,9,11 and 12 every 3 weeks, start first day of chemo. Patient not taking: Reported on 06/01/2015 04/18/15   Curt Bears, MD  montelukast (SINGULAIR) 10 MG tablet Take 10 mg by mouth. 04/18/15 04/17/16  Historical Provider, MD  pioglitazone (ACTOS) 15 MG tablet Take 15 mg by mouth daily. Reported on 05/16/2015 08/19/12  Historical Provider, MD     Vital Signs: BP 156/88 mmHg  Pulse 66  Temp(Src) 99.1 F (37.3 C) (Oral)  Resp 16  Ht 5' 11"  (1.803 m)  Wt 155 lb 6.8 oz (70.5 kg)  BMI 21.69 kg/m2  SpO2 100%  Physical Exam patient  awake, alert. Chest clear to auscultation bilaterally. Mean, intact left chest wall Port-A-Cath. Heart with regular rate and rhythm. Abdomen soft, positive bowel sounds, nontender. Left lower extremity edema noted, no right lower extremity edema  Imaging: US Renal  06/01/2015  CLINICAL DATA:  Acute kidney injury, diabetes mellitus, hypertension and EXAM: RENAL / URINARY TRACT ULTRASOUND COMPLETE COMPARISON:  Abdominal ultrasound 03/13/2015 FINDINGS: Right Kidney: Length: 11.8 cm. Normal cortical thickness. Increased cortical echogenicity. No hydronephrosis or shadowing calcification. Two cysts identified at mid RIGHT kidney 1.6 x 1.4 x 1.5 cm and at the mid inferior pole 2.9 x 2.5 x 2.4 cm, smaller lesion demonstrating scattered internal echogenicity/complication. Left Kidney: Length: 13.9 cm. Normal cortical thickness. Increased cortical echogenicity. No mass, hydronephrosis or shadowing calcification. Bladder: Partially distended, unremarkable. IMPRESSION: Medical renal disease changes of both kidneys. 2 RIGHT renal cysts, larger 2.9 cm greatest size simple in character with smaller lesion 1.6 cm greatest size complicated by scattered internal echogenicity. Either followup ultrasound in 4-6 months to demonstrate stability or characterization by followup non emergent MRI after discharge when patient can optimally cooperate with MR imaging recommended to exclude cystic renal neoplasm. Electronically Signed   By: Lavonia Dana M.D.   On: 06/01/2015 13:46    Labs:  CBC:  Recent Labs  06/02/15 0500 06/02/15 1130 06/03/15 0131 06/03/15 2000 06/04/15 0600  WBC 2.8*  --  2.8* 2.7* 2.9*  HGB 6.7* 7.1* 6.1* 7.8* 7.8*  HCT 19.9* 21.0* 17.8* 22.4* 22.7*  PLT 100*  --  90* 92* 92*    COAGS:  Recent Labs  08/11/14 1315 06/01/15 1405 06/01/15 2200 06/02/15 0927  INR 1.02  --   --   --   APTT  --  29 81* 145*    BMP:  Recent Labs  06/01/15 0744 06/02/15 0500 06/03/15 0935 06/04/15 0600    NA 134* 135 136 138  K 3.9 5.1 4.2 5.2*  CL  --  101 107 110  CO2 22 20* 16* 18*  GLUCOSE 94 235* 145* 138*  BUN 49.7* 63* 65* 81*  CALCIUM 8.6 7.7* 7.1* 7.1*  CREATININE 10.6* 10.71* 9.48* 9.44*  GFRNONAA  --  4* 5* 5*  GFRAA  --  5* 6* 6*    LIVER FUNCTION TESTS:  Recent Labs  05/14/15 0847 05/21/15 0838 05/28/15 0849 06/01/15 0744  BILITOT 1.11 1.76* 1.39* 1.35*  AST 22 27 25 30   ALT 11 11 11 9   ALKPHOS 43 47 48 49  PROT 7.6 7.1 7.7 7.2  ALBUMIN 3.0* 2.8* 2.8* 2.7*    Assessment and Plan: Patient with history of refractory multiple myeloma, hypertension, diabetes, and acute on chronic renal failure, 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 Patient assessed by nephrology and request now received for tunneled HD catheter placement. Details/risks of procedure, including but not limited to, internal bleeding, infection, injury to adjacent structures, discussed with patient with his understanding and consent. Tentative plan at this time is for pt transfer to [facility] later this evening and for HD catheter placement on 5/16. IV heparin will need to be stopped 1 hour prior to case.   Electronically Signed: D. Rowe Robert 06/04/2015, 4:14 PM  I spent a total of 25 minutes at the the patient's bedside AND on the patient's hospital floor or unit, greater than 50% of which was counseling/coordinating care for tunneled HD catheter placement

## 2015-06-04 NOTE — Plan of Care (Signed)
Problem: Activity: Goal: Risk for activity intolerance will decrease Outcome: Completed/Met Date Met:  06/04/15 Patient remains independent, gait is steady. Pt encouraged to be mindful of IV equipment and to call for assistance with activity

## 2015-06-05 ENCOUNTER — Inpatient Hospital Stay (HOSPITAL_COMMUNITY): Payer: Medicare Other

## 2015-06-05 LAB — CBC
HEMATOCRIT: 22.6 % — AB (ref 39.0–52.0)
Hemoglobin: 7.8 g/dL — ABNORMAL LOW (ref 13.0–17.0)
MCH: 27.6 pg (ref 26.0–34.0)
MCHC: 34.5 g/dL (ref 30.0–36.0)
MCV: 79.9 fL (ref 78.0–100.0)
Platelets: 90 10*3/uL — ABNORMAL LOW (ref 150–400)
RBC: 2.83 MIL/uL — ABNORMAL LOW (ref 4.22–5.81)
RDW: 16.3 % — AB (ref 11.5–15.5)
WBC: 2.2 10*3/uL — ABNORMAL LOW (ref 4.0–10.5)

## 2015-06-05 LAB — HEPATITIS B SURFACE ANTIGEN: Hepatitis B Surface Ag: NEGATIVE

## 2015-06-05 LAB — BASIC METABOLIC PANEL
Anion gap: 13 (ref 5–15)
BUN: 71 mg/dL — ABNORMAL HIGH (ref 6–20)
CALCIUM: 7.3 mg/dL — AB (ref 8.9–10.3)
CO2: 17 mmol/L — AB (ref 22–32)
CREATININE: 9.36 mg/dL — AB (ref 0.61–1.24)
Chloride: 111 mmol/L (ref 101–111)
GFR calc Af Amer: 6 mL/min — ABNORMAL LOW (ref >60)
GFR calc non Af Amer: 5 mL/min — ABNORMAL LOW (ref >60)
GLUCOSE: 97 mg/dL (ref 65–99)
Potassium: 4 mmol/L (ref 3.5–5.1)
Sodium: 141 mmol/L (ref 135–145)

## 2015-06-05 LAB — GLUCOSE, CAPILLARY
GLUCOSE-CAPILLARY: 73 mg/dL (ref 65–99)
Glucose-Capillary: 100 mg/dL — ABNORMAL HIGH (ref 65–99)
Glucose-Capillary: 71 mg/dL (ref 65–99)
Glucose-Capillary: 85 mg/dL (ref 65–99)
Glucose-Capillary: 87 mg/dL (ref 65–99)

## 2015-06-05 LAB — HEPARIN LEVEL (UNFRACTIONATED): Heparin Unfractionated: 0.34 IU/mL (ref 0.30–0.70)

## 2015-06-05 MED ORDER — MIDAZOLAM HCL 2 MG/2ML IJ SOLN
INTRAMUSCULAR | Status: AC
  Filled 2015-06-05: qty 2

## 2015-06-05 MED ORDER — MIDAZOLAM HCL 2 MG/2ML IJ SOLN
INTRAMUSCULAR | Status: AC | PRN
  Administered 2015-06-05: 1 mg via INTRAVENOUS

## 2015-06-05 MED ORDER — CEFAZOLIN SODIUM-DEXTROSE 2-4 GM/100ML-% IV SOLN
INTRAVENOUS | Status: AC
  Filled 2015-06-05: qty 100

## 2015-06-05 MED ORDER — HEPARIN SODIUM (PORCINE) 1000 UNIT/ML IJ SOLN
INTRAMUSCULAR | Status: AC
  Filled 2015-06-05: qty 1

## 2015-06-05 MED ORDER — FENTANYL CITRATE (PF) 100 MCG/2ML IJ SOLN
INTRAMUSCULAR | Status: AC
  Filled 2015-06-05: qty 2

## 2015-06-05 MED ORDER — FENTANYL CITRATE (PF) 100 MCG/2ML IJ SOLN
INTRAMUSCULAR | Status: AC | PRN
  Administered 2015-06-05: 25 ug via INTRAVENOUS

## 2015-06-05 MED ORDER — LIDOCAINE-EPINEPHRINE (PF) 1 %-1:200000 IJ SOLN
INTRAMUSCULAR | Status: AC
  Filled 2015-06-05: qty 30

## 2015-06-05 MED ORDER — GELATIN ABSORBABLE 12-7 MM EX MISC
CUTANEOUS | Status: AC
  Filled 2015-06-05: qty 1

## 2015-06-05 NOTE — Sedation Documentation (Signed)
Patient is resting comfortably. No complaints at this time 

## 2015-06-05 NOTE — Progress Notes (Signed)
Inpatient Diabetes Program Recommendations  AACE/ADA: New Consensus Statement on Inpatient Glycemic Control (2015)  Target Ranges:  Prepandial:   less than 140 mg/dL      Peak postprandial:   less than 180 mg/dL (1-2 hours)      Critically ill patients:  140 - 180 mg/dL   Review of Glycemic Control:  Results for FLINT, ETTER (MRN [id number]) as of 06/05/2015 14:12  Ref. Range 06/04/2015 12:09 06/04/2015 17:07 06/04/2015 20:13 06/04/2015 23:25 06/05/2015 00:18 06/05/2015 07:59 06/05/2015 11:25  Glucose-Capillary Latest Ref Range: 65-99 mg/dL 250 (H) 236 (H) 151 (H) 67 85 71 87    Diabetes history: Type 2 diabetes Outpatient Diabetes medications: Actos 15 mg daily Current orders for Inpatient glycemic control:  Novolog moderate tid with meals and HS  Inpatient Diabetes Program Recommendations:    Please consider reducing Novolog correction to sensitive tid with meals.  Blood sugars improved since steroids stopped.  Thanks, Adah Perl, RN, BC-ADM Inpatient Diabetes Coordinator Pager [phone] (8a-5p)

## 2015-06-05 NOTE — Progress Notes (Signed)
PROGRESS NOTE    Eddie Thomas  RXV:[id number] DOB: 07-01-1945 DOA: 06/01/2015 PCP: [name]   Outpatient Specialists: Oncology dr Julien Nordmann.     Brief Narrative: Eddie Thomas is a 70 y.o. male with medical history significant of multiple myeloma, IgA subtype diagnosed in 201, currently undergoing treatment with Velcade/Daratumumab at the cancer center, follows Dr. Julien Nordmann of medical oncology, also having a history of chronic kidney disease (with baseline creatinine near 1.6-2.0), hypertension, diabetes mellitus, presented as a transfer from the cancer center for acute renal failure with creatinine of 10. Renal consulted and recommended initiation of hemodialysis, transferred to [facility] on 5/15, plan for tunneled dialysis catheter and initiation of dialysis today.  Assessment & Plan:   Principal Problem:   Acute on chronic renal failure (HCC) Active Problems:   Multiple myeloma (HCC)   Hypertension   Bone marrow transplant complication (HCC)   Dehydration   Hypoalbuminemia due to protein-calorie malnutrition (HCC)   Antineoplastic chemotherapy induced pancytopenia ([place])   Long term current use of anticoagulant therapy   Acute renal failure (ARF) (HCC)   Acute renal failure: - Secondary to myeloma kidney, versus chemotherapy versus volume depletion  - Obstructive causes ruled out.  - Renal consult greatly appreciated, plan is to initiate hemodialysis, plan for tunneled dialysis catheter today, and hematemesis initiation after that  Multiple Myeloma: - Follows with Dr Julien Nordmann.   Anemia - due to multiple myeloma, transfused 2 units PRBC, monitor hemoglobin closely  Hyperkalemia - Monitor potassium closely  NSVT: - Patient had 15 beats of nonsustained V. tach, hypomagnesemia corrected, 2-D echo done 11/2015 with EF 55-60% - Continue with telemetry monitor, would consider low-dose beta blocker if recurrence  History of DVT - Xarelto on hold given renal  function - Obtain with heparin GTT, will need to transition to warfarin once no further procedures planned.      DVT prophylaxis: Heparin GTT Code Status: Full Family Communication: none at bedside.  Disposition Plan: pending further workup   Consultants:   Renal.  Oncology  IR   Procedures: US renal     Antimicrobials: none   Subjective: Feeling okay , and eyes any chest pain, shortness of breath  Objective: Filed Vitals:   06/04/15 2144 06/04/15 2327 06/05/15 0352 06/05/15 0903  BP: 149/78 151/77 156/73 154/64  Pulse: 69 72 75 75  Temp: 98.1 F (36.7 C) 97.7 F (36.5 C) 98.3 F (36.8 C) 98.7 F (37.1 C)  TempSrc: Oral Oral Oral Oral  Resp: 20 20 18 16   Height:      Weight:  78.472 kg (173 lb)    SpO2: 99% 100% 100% 99%    Intake/Output Summary (Last 24 hours) at 06/05/15 1125 Last data filed at 06/05/15 0900  Gross per 24 hour  Intake 2563.98 ml  Output    275 ml  Net 2288.98 ml   Filed Weights   06/01/15 1229 06/04/15 2327  Weight: 70.5 kg (155 lb 6.8 oz) 78.472 kg (173 lb)    Examination:  General exam: Appears calm and comfortable  Respiratory system: Clear to auscultation. Respiratory effort normal. Cardiovascular system: S1 & S2 heard, RRR. No JVD, murmurs, rubs, gallops or clicks. No pedal edema. Gastrointestinal system: Abdomen is nondistended, soft and nontender. No organomegaly or masses felt. Normal bowel sounds heard. Central nervous system: Alert and oriented. No focal neurological deficits. Extremities: Symmetric 5 x 5 power. Skin: No rashes, lesions or ulcers Psychiatry: Judgement and insight appear normal. Mood & affect appropriate.  Data Reviewed: I have personally reviewed following labs and imaging studies  CBC:  Recent Labs Lab 06/01/15 1405 06/02/15 0500 06/02/15 1130 06/03/15 0131 06/03/15 2000 06/04/15 0600  WBC 2.7* 2.8*  --  2.8* 2.7* 2.9*  HGB 8.1* 6.7* 7.1* 6.1* 7.8* 7.8*  HCT 24.4* 19.9* 21.0*  17.8* 22.4* 22.7*  MCV 79.2 76.5*  --  78.1 78.6 78.0  PLT 101* 100*  --  90* 92* 92*   Basic Metabolic Panel:  Recent Labs Lab 06/01/15 0744 06/02/15 0500 06/02/15 1130 06/03/15 0935 06/04/15 0600  NA 134* 135  --  136 138  K 3.9 5.1  --  4.2 5.2*  CL  --  101  --  107 110  CO2 22 20*  --  16* 18*  GLUCOSE 94 235*  --  145* 138*  BUN 49.7* 63*  --  65* 81*  CREATININE 10.6* 10.71*  --  9.48* 9.44*  CALCIUM 8.6 7.7*  --  7.1* 7.1*  MG  --   --  1.2* 1.9  --    GFR: Estimated Creatinine Clearance: 7.9 mL/min (by C-G formula based on Cr of 9.44). Liver Function Tests:  Recent Labs Lab 06/01/15 0744  AST 30  ALT 9  ALKPHOS 49  BILITOT 1.35*  PROT 7.2  ALBUMIN 2.7*   No results for input(s): LIPASE, AMYLASE in the last 168 hours. No results for input(s): AMMONIA in the last 168 hours. Coagulation Profile: No results for input(s): INR, PROTIME in the last 168 hours. Cardiac Enzymes:  Recent Labs Lab 06/01/15 1230  CKTOTAL 45*   BNP (last 3 results) No results for input(s): PROBNP in the last 8760 hours. HbA1C: No results for input(s): HGBA1C in the last 72 hours. CBG:  Recent Labs Lab 06/04/15 1707 06/04/15 2013 06/04/15 2325 06/05/15 0018 06/05/15 0759  GLUCAP 236* 151* 67 85 71   Lipid Profile: No results for input(s): CHOL, HDL, LDLCALC, TRIG, CHOLHDL, LDLDIRECT in the last 72 hours. Thyroid Function Tests: No results for input(s): TSH, T4TOTAL, FREET4, T3FREE, THYROIDAB in the last 72 hours. Anemia Panel: No results for input(s): VITAMINB12, FOLATE, FERRITIN, TIBC, IRON, RETICCTPCT in the last 72 hours. Urine analysis:    Component Value Date/Time   COLORURINE YELLOW 06/01/2015 [place] 06/01/2015 1540   LABSPEC 1.008 06/01/2015 1540   PHURINE 6.0 06/01/2015 1540   GLUCOSEU NEGATIVE 06/01/2015 1540   HGBUR TRACE* 06/01/2015 1540   BILIRUBINUR NEGATIVE 06/01/2015 1540   KETONESUR NEGATIVE 06/01/2015 1540   PROTEINUR 30*  06/01/2015 1540   UROBILINOGEN 1.0 01/16/2012 1902   NITRITE NEGATIVE 06/01/2015 1540   LEUKOCYTESUR NEGATIVE 06/01/2015 1540   Sepsis Labs: No results for input(s): PROCALCITON, LATICACIDVEN in the last 168 hours.  No results found for this or any previous visit (from the past 240 hour(s)).       Radiology Studies: No results found.      Scheduled Meds: .  ceFAZolin (ANCEF) IV  2 g Intravenous to XRAY  . feeding supplement (ENSURE ENLIVE)  237 mL Oral BID BM  . insulin aspart  0-15 Units Subcutaneous TID WC  . insulin aspart  0-5 Units Subcutaneous QHS  . isosorbide mononitrate  30 mg Oral Daily  . sodium bicarbonate  650 mg Oral TID  . sodium chloride flush  3 mL Intravenous Q12H   Continuous Infusions: . sodium chloride 50 mL/hr at 06/04/15 2330  . heparin 750 Units/hr (06/04/15 2250)     LOS: 4 days  Time spent: 25 minutes.     Phillips Climes, MD [facility] Pager [phone]  If 7PM-7AM, please contact night-coverage [email or web address] Password [email or web address] 06/05/2015, 11:25 AM

## 2015-06-05 NOTE — Sedation Documentation (Signed)
Patient is resting comfortably. Vitals stable. 

## 2015-06-05 NOTE — Progress Notes (Signed)
Attempted to call number listed in orders ([phone]) to determine time of patient's IR procedure.  Automated message stated that hours were from 7 am to 5 pm.  Will pass along to day shift RN to complete.

## 2015-06-05 NOTE — Progress Notes (Signed)
Hypoglycemic Event  CBG: 67  Treatment: 15 GM carbohydrate snack  Symptoms: None  Follow-up CBG Time:0018 CBG Result:85  Possible Reasons for Event: Inadequate meal intake  Comments/MD notified: Notified provider on-call.

## 2015-06-05 NOTE — Progress Notes (Addendum)
ANTICOAGULATION CONSULT NOTE - Follow Up Consult  Pharmacy Consult for Heparin Indication: Hx of DVT (rivaroxaban held for renal failure)  No Known Allergies  Patient Measurements: Height: 5\' 11"  (180.3 cm) Weight: 173 lb (78.472 kg) IBW/kg (Calculated) : 75.3 Heparin Dosing Weight: actual weight 78.4 kg  Vital Signs: Temp: 98.7 F (37.1 C) (05/16 0903) Temp Source: Oral (05/16 0903) BP: 154/64 mmHg (05/16 0903) Pulse Rate: 75 (05/16 0903)  Labs:  Recent Labs  06/03/15 0131 06/03/15 0935 06/03/15 2000 06/04/15 0600 06/04/15 1600 06/05/15 0420  HGB 6.1*  --  7.8* 7.8*  --   --   HCT 17.8*  --  22.4* 22.7*  --   --   PLT 90*  --  92* 92*  --   --   HEPARINUNFRC 0.67  --  0.79* 0.72* 0.43 0.34  CREATININE  --  9.48*  --  9.44*  --   --     Estimated Creatinine Clearance: 7.9 mL/min (by C-G formula based on Cr of 9.44).   Medications:  Infusions:  . sodium chloride 50 mL/hr at 06/04/15 2330  . heparin 750 Units/hr (06/04/15 2250)    Assessment: Eddie Thomas admitted on 5/12 with acute on CKD (baseline SCr 1.6). 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 Rivaroxaban is held for acute renal failure and Pharmacy is consulted to dose Heparin.  PTA Xarelto 20mg  daily. Last dose taken on 5/10 at unknown time (originally reported as last dose on 5/11 at 2330)  Today, 06/05/2015  Heparin level is 0.34, remains therapeutic on 750 units/hr.  CBC pending. No bleeding reported.  Tunneled HD catheter placement done on 06/04/15  Goal of Therapy:  Heparin level 0.3-0.7 units/ml Monitor platelets by anticoagulation protocol: Yes   Plan: Continue IV heparin drip at 750 units/hr Daily heparin level and CBC  Nicole Cella, RPh Clinical Pharmacist Pager: [phone] 06/05/2015,10:26 AM.

## 2015-06-05 NOTE — Progress Notes (Signed)
Per CCMD, patient had 16 beat run of ventricular tachycardia.  Asymptomatic.  Notified provider on-call.  Will continue to monitor.

## 2015-06-05 NOTE — Progress Notes (Signed)
[facility] ROUNDING NOTE   Subjective:   Interval History:  No change.  Tired and anorexia.    Objective:  Vital signs in last 24 hours:  Temp:  [97.7 F (36.5 C)-98.7 F (37.1 C)] 98.7 F (37.1 C) (05/16 0903) Pulse Rate:  [69-75] 75 (05/16 0903) Resp:  [16-20] 16 (05/16 0903) BP: (149-156)/(64-78) 154/64 mmHg (05/16 0903) SpO2:  [99 %-100 %] 99 % (05/16 0903) Weight:  [78.472 kg (173 lb)] 78.472 kg (173 lb) (05/15 2327)  Weight change:  Filed Weights   06/01/15 1229 06/04/15 2327  Weight: 70.5 kg (155 lb 6.8 oz) 78.472 kg (173 lb)    Intake/Output: I/O last 3 completed shifts: In: 4448.2 [P.O.:720; I.V.:3728.2] Out: 600 [Urine:600]   Intake/Output this shift:     General: Alert, Well-developed, somewhat thin  Head: Normocephalic and atraumatic. Neck: Supple; no masses or thyromegaly. JVP not elevated Lungs: Clear throughout to auscultation. No wheezes, crackles, or rhonchi. No acute distress. Heart: Regular rate and rhythm; no murmurs, clicks, rubs, or gallops. Abdomen: Soft, nontender and nondistended. No masses, hepatosplenomegaly or hernias noted. Normal bowel sounds, without guarding, and without rebound.  Msk: Symmetrical without gross deformities. Normal posture. Pulses: No carotid, renal, femoral bruits. DP and PT symmetrical and equal Extremities: Without clubbing or edema.   Basic Metabolic Panel:  Recent Labs Lab 06/01/15 0744 06/02/15 0500 06/02/15 1130 06/03/15 0935 06/04/15 0600  NA 134* 135  --  136 138  K 3.9 5.1  --  4.2 5.2*  CL  --  101  --  107 110  CO2 22 20*  --  16* 18*  GLUCOSE 94 235*  --  145* 138*  BUN 49.7* 63*  --  65* 81*  CREATININE 10.6* 10.71*  --  9.48* 9.44*  CALCIUM 8.6 7.7*  --  7.1* 7.1*  MG  --   --  1.2* 1.9  --     Liver Function Tests:  Recent Labs Lab 06/01/15 0744  AST 30  ALT 9  ALKPHOS 49  BILITOT 1.35*  PROT 7.2  ALBUMIN 2.7*   No results for input(s): LIPASE,  AMYLASE in the last 168 hours. No results for input(s): AMMONIA in the last 168 hours.  CBC:  Recent Labs Lab 06/01/15 1405 06/02/15 0500 06/02/15 1130 06/03/15 0131 06/03/15 2000 06/04/15 0600  WBC 2.7* 2.8*  --  2.8* 2.7* 2.9*  HGB 8.1* 6.7* 7.1* 6.1* 7.8* 7.8*  HCT 24.4* 19.9* 21.0* 17.8* 22.4* 22.7*  MCV 79.2 76.5*  --  78.1 78.6 78.0  PLT 101* 100*  --  90* 92* 92*    Cardiac Enzymes:  Recent Labs Lab 06/01/15 1230  CKTOTAL 45*    BNP: Invalid input(s): POCBNP  CBG:  Recent Labs Lab 06/04/15 2013 06/04/15 2325 06/05/15 0018 06/05/15 0759 06/05/15 1125  GLUCAP 151* 67 85 71 87    Microbiology: Results for orders placed or performed in visit on 05/21/15  TECHNOLOGIST REVIEW     Status: None   Collection Time: 05/21/15  8:38 AM  Result Value Ref Range Status   Technologist Review Few Metas and Myelocytes present and few helmets  Final   *Note: Due to a large number of results and/or encounters for the requested time period, some results have not been displayed. A complete set of results can be found in Results Review.    Coagulation Studies: No results for input(s): LABPROT, INR in the last 72 hours.  Urinalysis: No results for input(s): COLORURINE, LABSPEC, [place],  GLUCOSEU, HGBUR, BILIRUBINUR, KETONESUR, PROTEINUR, UROBILINOGEN, NITRITE, LEUKOCYTESUR in the last 72 hours.  Invalid input(s): APPERANCEUR    Imaging: No results found.   Medications:   . sodium chloride 50 mL/hr at 06/04/15 2330  . heparin Stopped (06/05/15 1241)   .  ceFAZolin (ANCEF) IV  2 g Intravenous to XRAY  . feeding supplement (ENSURE ENLIVE)  237 mL Oral BID BM  . insulin aspart  0-15 Units Subcutaneous TID WC  . insulin aspart  0-5 Units Subcutaneous QHS  . isosorbide mononitrate  30 mg Oral Daily  . sodium bicarbonate  650 mg Oral TID  . sodium chloride flush  3 mL Intravenous Q12H   sodium chloride, sodium chloride, acetaminophen **OR** acetaminophen,  alteplase, heparin, heparin, lidocaine (PF), lidocaine-prilocaine, morphine injection, ondansetron **OR** ondansetron (ZOFRAN) IV, oxyCODONE, pentafluoroprop-tetrafluoroeth, sodium chloride flush  Assessment/ Plan:  1.  Acute/ subacute renal failure - creat started to rise in March-Apr 2017. Now creat is 9-10.  UA negative, [place] negative.  Presentation c/w myeloma kidney.  Has advanced myeloma w progression despite multiple different chemo regimens and SCT in 2012.  Plan from renal standpoint is dialysis.  We should consider overall prognosis as well, perhaps entertain a consult w pall care.       2. Multiple myeloma - as above  Kelly Splinter MD [place] pager [phone]    cell [phone] 06/05/2015, 12:56 PM

## 2015-06-05 NOTE — Sedation Documentation (Signed)
Patient is resting comfortably. 

## 2015-06-05 NOTE — Procedures (Signed)
Successful RT IJ HD CATH INSERTION NO COMP TIP SVC/RA READY FOR USE FULL REPORT IN PACS

## 2015-06-06 DIAGNOSIS — Z7901 Long term (current) use of anticoagulants: Secondary | ICD-10-CM

## 2015-06-06 DIAGNOSIS — D61810 Antineoplastic chemotherapy induced pancytopenia: Secondary | ICD-10-CM

## 2015-06-06 DIAGNOSIS — I10 Essential (primary) hypertension: Secondary | ICD-10-CM

## 2015-06-06 LAB — BASIC METABOLIC PANEL
ANION GAP: 15 (ref 5–15)
BUN: 32 mg/dL — AB (ref 6–20)
CALCIUM: 7.6 mg/dL — AB (ref 8.9–10.3)
CO2: 24 mmol/L (ref 22–32)
CREATININE: 5.49 mg/dL — AB (ref 0.61–1.24)
Chloride: 102 mmol/L (ref 101–111)
GFR calc Af Amer: 11 mL/min — ABNORMAL LOW (ref >60)
GFR, EST NON AFRICAN AMERICAN: 10 mL/min — AB (ref >60)
GLUCOSE: 88 mg/dL (ref 65–99)
Potassium: 3.5 mmol/L (ref 3.5–5.1)
Sodium: 141 mmol/L (ref 135–145)

## 2015-06-06 LAB — CBC
HEMATOCRIT: 21.1 % — AB (ref 39.0–52.0)
Hemoglobin: 7 g/dL — ABNORMAL LOW (ref 13.0–17.0)
MCH: 26.4 pg (ref 26.0–34.0)
MCHC: 33.2 g/dL (ref 30.0–36.0)
MCV: 79.6 fL (ref 78.0–100.0)
PLATELETS: 92 10*3/uL — AB (ref 150–400)
RBC: 2.65 MIL/uL — ABNORMAL LOW (ref 4.22–5.81)
RDW: 16.2 % — AB (ref 11.5–15.5)
WBC: 2.1 10*3/uL — AB (ref 4.0–10.5)

## 2015-06-06 LAB — HEPARIN LEVEL (UNFRACTIONATED): Heparin Unfractionated: 0.15 IU/mL — ABNORMAL LOW (ref 0.30–0.70)

## 2015-06-06 LAB — GLUCOSE, CAPILLARY
GLUCOSE-CAPILLARY: 95 mg/dL (ref 65–99)
GLUCOSE-CAPILLARY: 83 mg/dL (ref 65–99)
GLUCOSE-CAPILLARY: 96 mg/dL (ref 65–99)
Glucose-Capillary: 196 mg/dL — ABNORMAL HIGH (ref 65–99)

## 2015-06-06 LAB — HEPATITIS B CORE ANTIBODY, TOTAL: Hep B Core Total Ab: NEGATIVE

## 2015-06-06 LAB — HEPATITIS B SURFACE ANTIBODY,QUALITATIVE: Hep B S Ab: NONREACTIVE

## 2015-06-06 MED ORDER — LIDOCAINE HCL (PF) 1 % IJ SOLN: 5.0000 mL | INTRAMUSCULAR | Status: DC | PRN

## 2015-06-06 MED ORDER — "THROMBI-PAD 3""X3"" EX PADS"
1.0000 | MEDICATED_PAD | Freq: Once | CUTANEOUS | Status: DC
  Filled 2015-06-06: qty 1

## 2015-06-06 MED ORDER — ALTEPLASE 2 MG IJ SOLR: 2.0000 mg | Freq: Once | INTRAMUSCULAR | Status: DC | PRN

## 2015-06-06 MED ORDER — HEPARIN SODIUM (PORCINE) 1000 UNIT/ML DIALYSIS: 1000.0000 [IU] | INTRAMUSCULAR | Status: DC | PRN

## 2015-06-06 MED ORDER — SODIUM CHLORIDE 0.9 % IV SOLN: 100.0000 mL | INTRAVENOUS | Status: DC | PRN

## 2015-06-06 MED ORDER — HEPARIN SODIUM (PORCINE) 1000 UNIT/ML DIALYSIS: 2000.0000 [IU] | INTRAMUSCULAR | Status: DC | PRN

## 2015-06-06 MED ORDER — PENTAFLUOROPROP-TETRAFLUOROETH EX AERO: 1.0000 "application " | INHALATION_SPRAY | CUTANEOUS | Status: DC | PRN

## 2015-06-06 MED ORDER — LIDOCAINE-PRILOCAINE 2.5-2.5 % EX CREA: 1.0000 "application " | TOPICAL_CREAM | CUTANEOUS | Status: DC | PRN

## 2015-06-06 NOTE — Progress Notes (Signed)
PROGRESS NOTE    Eddie Thomas  TMA:[id number] DOB: [birth year] DOA: 06/01/2015 PCP: Eddie Thomas   Outpatient Specialists: Oncology dr Eddie Thomas.   Brief Narrative: Eddie Thomas is a 70 y.o. male with medical history significant of multiple myeloma, IgA subtype, currently undergoing treatment with Velcade/Daratumumab at the cancer center, follows Dr. Julien Thomas of medical oncology, also having a history of chronic kidney disease (with baseline creatinine near 1.6-2.0), hypertension, diabetes mellitus, presented as a transfer from the cancer center for acute renal failure with creatinine of 10. Renal consulted and initiated hemodialysis.  Assessment & Plan:   Principal Problem:   Acute on chronic renal failure (HCC) Active Problems:   Multiple myeloma (HCC)   Hypertension   Bone marrow transplant complication (HCC)   Dehydration   Hypoalbuminemia due to protein-calorie malnutrition (HCC)   Antineoplastic chemotherapy induced pancytopenia ([place])   Long term current use of anticoagulant therapy   Acute renal failure (ARF) (HCC)   Acute renal failure complicating chronic kidney disease: - Nephrology was consulted. As per nephrology, creatinine started to rise in March-April 2017 up to 1.4-1.8 range with some bumps up to 2.3-2.5. On May 8, creatinine was 3.3. Now admitted with creatinine of 10. 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 Patient's myeloma is refractory to multiple different chemotherapy combinations and do not believe that his chemotherapy or pheresis is going to help his renal function. - HD catheter placed and patient was started on hemodialysis 5/16. - Discussed with Eddie Thomas on 5/17: Prognosis for dialysis in myeloma patients is not great and he suggests considering palliative care input-will discuss with primary oncologist in a.m. - [facility] being consulted for [facility] access. Plan for HD 5/17, 5/18 and then 3 times a week.  Multiple Myeloma: - Follows with Dr Eddie Thomas.    Anemia - due to multiple myeloma, transfused 2 units PRBC, monitor hemoglobin closely hemoglobin in the 7.8 range for last 3 days and has dropped to 7 on 5/17. Patient had some bleeding from HD catheter overnight 5/16. This has stopped. Follow CBC in a.m. and consider transfusing if hemoglobin less than 7 g per DL.  Pancytopenia - Secondary to multiple myeloma and related cancer chemotherapy. Platelets stable in the 90s. WBC stable in the 2 range. Anemia management as above.  Hyperkalemia - Resolved after dialysis.  NSVT: - Patient had 15 beats of nonsustained V. tach, hypomagnesemia corrected, 2-D echo done 11/2015 with EF 55-60% - Continue with telemetry monitor, would consider low-dose beta blocker if recurrence - May be related to renal failure, electrolyte abnormalities. Repeat 2-D echo to reassess LV function.  History of DVT - Xarelto on hold given renal function - Currently on IV heparin drip per pharmacy, will need to transition to warfarin once no further procedures planned. Discuss with oncologist/PCP regarding duration of anticoagulation-as per pharmacy exploration, patient had 1 episode of DVT in June 2016.  Essential hypertension - ACEI stopped due to acute renal failure.      DVT prophylaxis: Heparin GTT Code Status: Full Family Communication: Discussed with patient's son at bedside on 5/17. Disposition Plan: DC home when medically stable, may not be until early next week.   Consultants:   Renal.  Oncology  IR  Procedures:   Right IJ HD catheter insertion by interventional radiology on 5/16  Hemodialysis initiated 5/16   Antimicrobials:  None  Subjective: "Feels so-so". Denies specific complaints. No chest pain, dyspnea reported. Son at bedside.  Objective: Filed Vitals:   06/06/15 1259 06/06/15 1330 06/06/15 1400  06/06/15 1430  BP: 164/85 155/83 156/84 164/84  Pulse: 78 76 73 73  Temp:      TempSrc:      Resp: 17 18 18 19   Height:       Weight:      SpO2:      Temperature: 98.3F, oxygen saturation 96%.  Intake/Output Summary (Last 24 hours) at 06/06/15 1452 Last data filed at 06/06/15 1300  Gross per 24 hour  Intake 1553.22 ml  Output    300 ml  Net 1253.22 ml   Filed Weights   06/05/15 1815 06/05/15 2122 06/06/15 1247  Weight: 85.503 kg (188 lb 8 oz) 85.5 kg (188 lb 7.9 oz) 76.8 kg (169 lb 5 oz)    Examination:  General exam: Pleasant middle-aged male sitting up comfortably in chair this morning. Respiratory system: Clear to auscultation. Respiratory effort normal. Left upper chest Port-A-Cath. Right upper chest IJ HD catheter site dressing clean and dry. Cardiovascular system: S1 & S2 heard, RRR. No JVD, murmurs, rubs, gallops or clicks. No pedal edema. Telemetry: Sinus rhythm. 16 beat NSVT versus PVCs on 5/16 at 4:19 AM and had another 17 beat NSVT on 5/13. Gastrointestinal system: Abdomen is nondistended, soft and nontender. No organomegaly or masses felt. Normal bowel sounds heard. Central nervous system: Alert and oriented. No focal neurological deficits. Extremities: Symmetric 5 x 5 power. Skin: No rashes, lesions or ulcers Psychiatry: Judgement and insight appear normal. Mood & affect appropriate.     Data Reviewed: I have personally reviewed following labs and imaging studies  CBC:  Recent Labs Lab 06/03/15 0131 06/03/15 2000 06/04/15 0600 06/05/15 1841 06/06/15 0605  WBC 2.8* 2.7* 2.9* 2.2* 2.1*  HGB 6.1* 7.8* 7.8* 7.8* 7.0*  HCT 17.8* 22.4* 22.7* 22.6* 21.1*  MCV 78.1 78.6 78.0 79.9 79.6  PLT 90* 92* 92* 90* 92*   Basic Metabolic Panel:  Recent Labs Lab 06/02/15 0500 06/02/15 1130 06/03/15 0935 06/04/15 0600 06/05/15 1841 06/06/15 0605  NA 135  --  136 138 141 141  K 5.1  --  4.2 5.2* 4.0 3.5  CL 101  --  107 110 111 102  CO2 20*  --  16* 18* 17* 24  GLUCOSE 235*  --  145* 138* 97 88  BUN 63*  --  65* 81* 71* 32*  CREATININE 10.71*  --  9.48* 9.44* 9.36* 5.49*  CALCIUM  7.7*  --  7.1* 7.1* 7.3* 7.6*  MG  --  1.2* 1.9  --   --   --    GFR: Estimated Creatinine Clearance: 13.5 mL/min (by C-G formula based on Cr of 5.49). Liver Function Tests:  Recent Labs Lab 06/01/15 0744  AST 30  ALT 9  ALKPHOS 49  BILITOT 1.35*  PROT 7.2  ALBUMIN 2.7*   No results for input(s): LIPASE, AMYLASE in the last 168 hours. No results for input(s): AMMONIA in the last 168 hours. Coagulation Profile: No results for input(s): INR, PROTIME in the last 168 hours. Cardiac Enzymes:  Recent Labs Lab 06/01/15 1230  CKTOTAL 45*   BNP (last 3 results) No results for input(s): PROBNP in the last 8760 hours. HbA1C: No results for input(s): HGBA1C in the last 72 hours. CBG:  Recent Labs Lab 06/05/15 1125 06/05/15 1634 06/05/15 2214 06/06/15 0726 06/06/15 1136  GLUCAP 87 100* 73 95 196*   Lipid Profile: No results for input(s): CHOL, HDL, LDLCALC, TRIG, CHOLHDL, LDLDIRECT in the last 72 hours. Thyroid Function Tests: No results for input(s):  TSH, T4TOTAL, FREET4, T3FREE, THYROIDAB in the last 72 hours. Anemia Panel: No results for input(s): VITAMINB12, FOLATE, FERRITIN, TIBC, IRON, RETICCTPCT in the last 72 hours. Urine analysis:    Component Value Date/Time   COLORURINE YELLOW 06/01/2015 [place] 06/01/2015 1540   LABSPEC 1.008 06/01/2015 1540   PHURINE 6.0 06/01/2015 1540   GLUCOSEU NEGATIVE 06/01/2015 1540   HGBUR TRACE* 06/01/2015 1540   BILIRUBINUR NEGATIVE 06/01/2015 1540   KETONESUR NEGATIVE 06/01/2015 1540   PROTEINUR 30* 06/01/2015 1540   UROBILINOGEN 1.0 01/16/2012 1902   NITRITE NEGATIVE 06/01/2015 1540   LEUKOCYTESUR NEGATIVE 06/01/2015 1540   Sepsis Labs: No results for input(s): PROCALCITON, LATICACIDVEN in the last 168 hours.  No results found for this or any previous visit (from the past 240 hour(s)).       Radiology Studies: Ir Fluoro Guide Cv Line Right  06/05/2015  INDICATION: Acute kidney injury, multiple  myeloma, renal failure EXAM: ULTRASOUND GUIDANCE FOR VASCULAR ACCESS RIGHT INTERNAL JUGULAR PERMANENT HEMODIALYSIS CATHETER Date:  5/16/20175/16/2017 3:53 pm Radiologist:  M. Daryll Brod, MD Guidance:  Ultrasound and fluoroscopic FLUOROSCOPY TIME:  Fluoroscopy Time:  30 seconds (2 mGy). MEDICATIONS: 1% lidocaine locally, 2 g Ancef administered within 1 hour of the procedure ANESTHESIA/SEDATION: Versed 0.5 mg IV; Fentanyl 25 mcg IV; Moderate Sedation Time:  15 minutes The patient was continuously monitored during the procedure by the interventional radiology nurse under my direct supervision. CONTRAST:  None COMPLICATIONS: None immediate. PROCEDURE: Informed consent was obtained from the patient following explanation of the procedure, risks, benefits and alternatives. The patient understands, agrees and consents for the procedure. All questions were addressed. A time out was performed. Maximal barrier sterile technique utilized including caps, mask, sterile gowns, sterile gloves, large sterile drape, hand hygiene, and 2% chlorhexidine scrub. Under sterile conditions and local anesthesia, right internal jugular micropuncture venous access was performed with ultrasound. Images were obtained for documentation of the patent right internal jugular vein. A guide wire was inserted followed by a transitional dilator. Next, a 0.035 guidewire was advanced into the IVC with a 5-French catheter. Measurements were obtained from the right venotomy site to the proximal right atrium. In the right infraclavicular chest, a subcutaneous tunnel was created under sterile conditions and local anesthesia. 1% lidocaine with epinephrine was utilized for this. The 19 cm tip to cuff palindrome catheter was tunneled subcutaneously to the venotomy site and inserted into the SVC/RA junction through a valved peel-away sheath. Position was confirmed with fluoroscopy. Images were obtained for documentation. Blood was aspirated from the catheter  followed by saline and heparin flushes. The appropriate volume and strength of heparin was instilled in each lumen. Caps were applied. The catheter was secured at the tunnel site with Gelfoam and a pursestring suture. The venotomy site was closed with subcuticular Vicryl suture. Dermabond was applied to the small right neck incision. A dry sterile dressing was applied. The catheter is ready for use. No immediate complications. IMPRESSION: Ultrasound and fluoroscopically guided right internal jugular tunneled hemodialysis catheter (19 cm tip to cuff palindrome catheter). Electronically Signed   By: Jerilynn Mages.  Shick M.D.   On: 06/05/2015 16:34   Ir US Guide Vasc Access Right  06/05/2015  INDICATION: Acute kidney injury, multiple myeloma, renal failure EXAM: ULTRASOUND GUIDANCE FOR VASCULAR ACCESS RIGHT INTERNAL JUGULAR PERMANENT HEMODIALYSIS CATHETER Date:  5/16/20175/16/2017 3:53 pm Radiologist:  M. Daryll Brod, MD Guidance:  Ultrasound and fluoroscopic FLUOROSCOPY TIME:  Fluoroscopy Time:  30 seconds (2 mGy). MEDICATIONS: 1% lidocaine  locally, 2 g Ancef administered within 1 hour of the procedure ANESTHESIA/SEDATION: Versed 0.5 mg IV; Fentanyl 25 mcg IV; Moderate Sedation Time:  15 minutes The patient was continuously monitored during the procedure by the interventional radiology nurse under my direct supervision. CONTRAST:  None COMPLICATIONS: None immediate. PROCEDURE: Informed consent was obtained from the patient following explanation of the procedure, risks, benefits and alternatives. The patient understands, agrees and consents for the procedure. All questions were addressed. A time out was performed. Maximal barrier sterile technique utilized including caps, mask, sterile gowns, sterile gloves, large sterile drape, hand hygiene, and 2% chlorhexidine scrub. Under sterile conditions and local anesthesia, right internal jugular micropuncture venous access was performed with ultrasound. Images were obtained for  documentation of the patent right internal jugular vein. A guide wire was inserted followed by a transitional dilator. Next, a 0.035 guidewire was advanced into the IVC with a 5-French catheter. Measurements were obtained from the right venotomy site to the proximal right atrium. In the right infraclavicular chest, a subcutaneous tunnel was created under sterile conditions and local anesthesia. 1% lidocaine with epinephrine was utilized for this. The 19 cm tip to cuff palindrome catheter was tunneled subcutaneously to the venotomy site and inserted into the SVC/RA junction through a valved peel-away sheath. Position was confirmed with fluoroscopy. Images were obtained for documentation. Blood was aspirated from the catheter followed by saline and heparin flushes. The appropriate volume and strength of heparin was instilled in each lumen. Caps were applied. The catheter was secured at the tunnel site with Gelfoam and a pursestring suture. The venotomy site was closed with subcuticular Vicryl suture. Dermabond was applied to the small right neck incision. A dry sterile dressing was applied. The catheter is ready for use. No immediate complications. IMPRESSION: Ultrasound and fluoroscopically guided right internal jugular tunneled hemodialysis catheter (19 cm tip to cuff palindrome catheter). Electronically Signed   By: Jerilynn Mages.  Shick M.D.   On: 06/05/2015 16:34        Scheduled Meds: . feeding supplement (ENSURE ENLIVE)  237 mL Oral BID BM  . insulin aspart  0-15 Units Subcutaneous TID WC  . insulin aspart  0-5 Units Subcutaneous QHS  . isosorbide mononitrate  30 mg Oral Daily  . sodium bicarbonate  650 mg Oral TID  . sodium chloride flush  3 mL Intravenous Q12H  . THROMBI-PAD  1 each Topical Once   Continuous Infusions: . heparin 750 Units/hr (06/06/15 0939)     LOS: 5 days    Time spent: 25 minutes.     [facility], MD [facility] Pager [phone]  If 7PM-7AM, please contact  night-coverage [email or web address] Password [email or web address] 06/06/2015, 2:52 PM

## 2015-06-06 NOTE — Progress Notes (Signed)
  [facility] Progress Note   Subjective: No change.  Still no appetite.  No further pericath bleeding.   Filed Vitals:   06/06/15 1247 06/06/15 1259 06/06/15 1330 06/06/15 1400  BP: 157/83 164/85 155/83 156/84  Pulse: 83 78 76 73  Temp: 98.5 F (36.9 C)     TempSrc: Oral     Resp: 15 17 18 18   Height:      Weight: 76.8 kg (169 lb 5 oz)     SpO2:        Inpatient medications: . feeding supplement (ENSURE ENLIVE)  237 mL Oral BID BM  . insulin aspart  0-15 Units Subcutaneous TID WC  . insulin aspart  0-5 Units Subcutaneous QHS  . isosorbide mononitrate  30 mg Oral Daily  . sodium bicarbonate  650 mg Oral TID  . sodium chloride flush  3 mL Intravenous Q12H  . THROMBI-PAD  1 each Topical Once   . heparin 750 Units/hr (06/06/15 0939)   acetaminophen **OR** acetaminophen, heparin, morphine injection, ondansetron **OR** ondansetron (ZOFRAN) IV, oxyCODONE, sodium chloride flush  Exam: General: Alert, wdwn, no distress Neck: no jvd Lungs: clear bilat Heart: RRR no mrg Abdomen: Soft, ntnd +bs Extremities: 1-2+ LE edema  UA negative Renal US > 11-13 cm kidneys, no hydro, ^'d echo     Assessment:  1. Acute/ subacute renal failure - creat started to rise in March-Apr 2017 up to 1.4- 1.8 range w some bumps up to 2.3- 2.5.  On May 8th creat was 3.3 and now admitted with creat of 10.   Patient has myeloma refractory to multiple different chemo combinations, disease onset was 2011, had SCT in 2012.  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  Don't think with his refractory disease that chemo is going to help and doubt that pheresis in this setting will be of much help either.  Recommend supportive care w dialysis. Have d/w patient, he agrees to proceed 2. Hypertension stopped acei 3. Volume - is up prob 5-7kg today 4. Anemia - severe, from myeloma + CKD.  With history of cancer and DVT, risks of ESA may outweigh benefit. Transfuse prn.   5. Multiple  myeloma - refractory to mult regimens and sp SCT 2012   Plan - prognosis w dialysis in myeloma patient is not great, but 25% live > 11yrs.  Plan is to consult [facility]VS for perm access, start CLIP process, HD again today and tomorrow then tiw.  Get vol down some.     Kelly Splinter MD [facility] pager [phone]    cell [phone] 06/06/2015, 2:21 PM    Recent Labs Lab 06/04/15 0600 06/05/15 1841 06/06/15 0605  NA 138 141 141  K 5.2* 4.0 3.5  CL 110 111 102  CO2 18* 17* 24  GLUCOSE 138* 97 88  BUN 81* 71* 32*  CREATININE 9.44* 9.36* 5.49*  CALCIUM 7.1* 7.3* 7.6*    Recent Labs Lab 06/01/15 0744  AST 30  ALT 9  ALKPHOS 49  BILITOT 1.35*  PROT 7.2  ALBUMIN 2.7*    Recent Labs Lab 06/04/15 0600 06/05/15 1841 06/06/15 0605  WBC 2.9* 2.2* 2.1*  HGB 7.8* 7.8* 7.0*  HCT 22.7* 22.6* 21.1*  MCV 78.0 79.9 79.6  PLT 92* 90* 92*

## 2015-06-06 NOTE — Consult Note (Signed)
[facility] [place]  Patient name: Eddie Thomas MRN: [id number] DOB: Oct 22, 1945 Sex: male  REASON FOR CONSULT: permanent dialysis access  HPI: Eddie Thomas is a 70 y.o. male, who presents for evaluation for permanent dialysis access. The patient started HD yesterday via a right IJ tunneled dialysis catheter. The patient is right handed. He has a left chest port. He has a past medical history of chronic kidney disease and multiple myeloma. He is currently undergoing treatment with Velcade/Daratumamab and is followed by Dr. Julien Nordmann.   He presented to [facility] from the cancer center after having generalized weakness, fatigue, metallic taste, anorexia and nausea. He has complained of weight loss.  He denies shortness of breath and chest pain. Full ROS below.   His past medical history includes DVT on Xarelto. His hypertension is managed on lisinopril and HCTZ. He has diabetes managed on oral hypoglycemics.   Past Medical History  Diagnosis Date  . Diabetes mellitus ([place]) 07/03/2011  . Hypertension 07/03/2011  . Hyperlipidemia 07/03/2011  . Colon polyps 2012  . Gastric ulcer   . Bulging lumbar disc   . Encounter for antineoplastic chemotherapy 03/05/2015  . Anemia   . Blood transfusion without reported diagnosis 2015  . Multiple myeloma 2011    currently undergoing chemo  . Cataract     Family History  Problem Relation Age of Onset  . Diabetes Mother   . Hyperlipidemia Mother   . Diabetes Sister   . Diabetes Brother   . Colon cancer Maternal Uncle 63    SOCIAL HISTORY: Social History   Social History  . Marital Status: Married    Spouse Name: N/A  . Number of Children: 2  . Years of Education: N/A   Occupational History  . retired    Social History Main Topics  . Smoking status: Never Smoker   . Smokeless tobacco: Never Used  . Alcohol Use: No  . Drug Use: No  . Sexual Activity: Not on file   Other Topics Concern  . Not on file    Social History Narrative    No Known Allergies  Current Facility-Administered Medications  Medication Dose Route Frequency Provider Last Rate Last Dose  . acetaminophen (TYLENOL) tablet 650 mg  650 mg Oral Q6H PRN Kelvin Cellar, MD   650 mg at 06/03/15 3976   Or  . acetaminophen (TYLENOL) suppository 650 mg  650 mg Rectal Q6H PRN Kelvin Cellar, MD      . feeding supplement (ENSURE ENLIVE) (ENSURE ENLIVE) liquid 237 mL  237 mL Oral BID BM Kelvin Cellar, MD   237 mL at 06/06/15 1000  . heparin ADULT infusion 100 units/mL (25000 units/250 mL)  750 Units/hr Intravenous Continuous JACEK COLSON, RPH 7.5 mL/hr at 06/06/15 0939 750 Units/hr at 06/06/15 0939  . heparin injection 2,000 Units  2,000 Units Dialysis PRN Roney Jaffe, MD      . insulin aspart (novoLOG) injection 0-15 Units  0-15 Units Subcutaneous TID WC Kelvin Cellar, MD   5 Units at 06/04/15 1800  . insulin aspart (novoLOG) injection 0-5 Units  0-5 Units Subcutaneous QHS Kelvin Cellar, MD   3 Units at 06/03/15 2217  . isosorbide mononitrate (IMDUR) 24 hr tablet 30 mg  30 mg Oral Daily Kelvin Cellar, MD   30 mg at 06/05/15 2200  . morphine 2 MG/ML injection 2 mg  2 mg Intravenous Q4H PRN Kelvin Cellar, MD      . ondansetron [facility]) tablet 4  mg  4 mg Oral Q6H PRN Kelvin Cellar, MD       Or  . ondansetron (ZOFRAN) injection 4 mg  4 mg Intravenous Q6H PRN Kelvin Cellar, MD      . oxyCODONE (Oxy IR/ROXICODONE) immediate release tablet 5 mg  5 mg Oral Q4H PRN Kelvin Cellar, MD      . sodium bicarbonate tablet 650 mg  650 mg Oral TID Edrick Oh, MD   650 mg at 06/06/15 0939  . sodium chloride flush (NS) 0.9 % injection 10-40 mL  10-40 mL Intracatheter PRN Kelvin Cellar, MD   10 mL at 06/06/15 0500  . sodium chloride flush (NS) 0.9 % injection 3 mL  3 mL Intravenous Q12H Kelvin Cellar, MD   3 mL at 06/03/15 1000  . THROMBI-PAD (THROMBI-PAD) 3"X3" pad 1 each  1 each Topical Once Jeryl Columbia, NP         REVIEW OF SYSTEMS:  [X]  denotes positive finding, [ ]  denotes negative finding Cardiac  Comments:  Chest pain or chest pressure:    Shortness of breath upon exertion:    Short of breath when lying flat:    Irregular heart rhythm:        Vascular    Pain in calf, thigh, or hip brought on by ambulation:    Pain in feet at night that wakes you up from your sleep:     Blood clot in your veins:    Leg swelling:         Pulmonary    Oxygen at home:    Productive cough:     Wheezing:         Neurologic    Sudden weakness in arms or legs:     Sudden numbness in arms or legs:     Sudden onset of difficulty speaking or slurred speech:    Temporary loss of vision in one eye:     Problems with dizziness:         Gastrointestinal    Blood in stool:     Vomited blood:         Genitourinary    Burning when urinating:     Blood in urine:        Psychiatric    Major depression:         Hematologic    Bleeding problems:    Problems with blood clotting too easily:        Skin    Rashes or ulcers:        Constitutional    Fever or chills:      PHYSICAL EXAM: Filed Vitals:   06/06/15 1330 06/06/15 1400 06/06/15 1430 06/06/15 1500  BP: 155/83 156/84 164/84 150/82  Pulse: 76 73 73 73  Temp:      TempSrc:      Resp: 18 18 19 19   Height:      Weight:      SpO2:        GENERAL: The patient is an ill-appearing male in no acute distress. The vital signs are documented above. CARDIAC: There is a regular rate and rhythm.  VASCULAR: 2+ radial pulses bilaterally. 2+ left ulnar pulse. 2+ dorsalis pedis pulses bilaterally. Right IJ TDC. Left chest port.  PULMONARY: There is good air exchange bilaterally without wheezing or rales. MUSCULOSKELETAL: Bilateral pedal edema. Left leg is larger than right.  NEUROLOGIC: No focal weakness or paresthesias are detected. SKIN: There are no ulcers or rashes noted. PSYCHIATRIC: The  patient has a normal affect.  DATA:  Vein mapping  pending  MEDICAL ISSUES: ESRD on HD Other active problems: Multiple myeloma undergoing chemotherapy, history of DVT, hypertension, diabetes  Await results of vein mapping. The patient is currently dialyzing via a right IJ tunneled dialysis catheter placed by IR. He does have a left chest port. Will make recommendations for access once results of vein mapping are back.    Virgina Jock, PA-C [facility] [place] [phone]  Agree with above assessment Patient is right handed He does have a Port-A-Cath in left subclavian vein but no history of edema left upper extremity Await results of vein mapping but will likely perform left upper extremity AV fistula Hope to perform surgery at about noon on Friday May need to adjust his dialysis schedule so that he would be finished with dialysis by 11 AM Friday or have it performed again on Thursday  Patient has been onXarelto for DVT in left leg which he had about one year ago but that has been discontinued and apparently is being transitioned to Coumadin. On IV heparin currently

## 2015-06-06 NOTE — Progress Notes (Signed)
Patient had some oozing from around his perm cath overnight.  He is on a heparin drip.  This has since stopped and clot has formed.  The old bloody bandage was removed and a new clean bandage was replaced.  Heparin drip may continued.  No further intervention at this time.  Eddie Thomas E 10:26 AM 06/06/2015

## 2015-06-06 NOTE — Progress Notes (Signed)
RN contacted IR. Dr. Jarvis Newcomer stated that Heparin drip can be restarted and Pam Tuprin, PA will come up to assess pt's catheter. No active bleeding at site. Dressing dry and intact. RN resumed Heparin drip.  Will continue to assess and monitor pt.  Gavin Potters, WTA

## 2015-06-06 NOTE — Progress Notes (Signed)
Heparin drip still on hold, MD paged to verify if heparin should be restarted. Per MD [facility], nephrologist and Radiology team should be notified to assess patient and to advice if heparin should be restarted. Morning RN present and updated about this information.

## 2015-06-06 NOTE — Progress Notes (Signed)
06/06/2015 4:04 PM Hemodialysis Outpatient Note; I have initiated the outpatient scheduling protocol for this patient. We are looking at the [place] center, any schedule and patient does have transportation. Insurance will be verified since the patient is deemed "acute". Thank you. Gordy Savers

## 2015-06-06 NOTE — Progress Notes (Signed)
No bleeding currently seen at site ([facility] HD cath). Will continue to monitor.

## 2015-06-06 NOTE — Progress Notes (Deleted)
[facility] [place]  Patient name: Eddie Thomas MRN: [id number] DOB: December 29, 1945 Sex: male  REASON FOR CONSULT: permanent dialysis access  HPI: Eddie Thomas is a 70 y.o. male, who presents for evaluation for permanent dialysis access. The patient started HD yesterday via a right IJ tunneled dialysis catheter. The patient is right handed. He has a left chest port. He has a past medical history of chronic kidney disease and multiple myeloma. He is currently undergoing treatment with Velcade/Daratumamab and is followed by Dr. Julien Nordmann.   He presented to [facility] from the cancer center after having generalized weakness, fatigue, metallic taste, anorexia and nausea. He has complained of weight loss.  He denies shortness of breath and chest pain. Full ROS below.   His past medical history includes DVT on Xarelto. His hypertension is managed on lisinopril and HCTZ. He has diabetes managed on oral hypoglycemics.   Past Medical History  Diagnosis Date  . Diabetes mellitus ([place]) 07/03/2011  . Hypertension 07/03/2011  . Hyperlipidemia 07/03/2011  . Colon polyps 2012  . Gastric ulcer   . Bulging lumbar disc   . Encounter for antineoplastic chemotherapy 03/05/2015  . Anemia   . Blood transfusion without reported diagnosis 2015  . Multiple myeloma 2011    currently undergoing chemo  . Cataract     Family History  Problem Relation Age of Onset  . Diabetes Mother   . Hyperlipidemia Mother   . Diabetes Sister   . Diabetes Brother   . Colon cancer Maternal Uncle 85    SOCIAL HISTORY: Social History   Social History  . Marital Status: Married    Spouse Name: N/A  . Number of Children: 2  . Years of Education: N/A   Occupational History  . retired    Social History Main Topics  . Smoking status: Never Smoker   . Smokeless tobacco: Never Used  . Alcohol Use: No  . Drug Use: No  . Sexual Activity: Not on file   Other Topics Concern  . Not on file    Social History Narrative    No Known Allergies  Current Facility-Administered Medications  Medication Dose Route Frequency Provider Last Rate Last Dose  . acetaminophen (TYLENOL) tablet 650 mg  650 mg Oral Q6H PRN Kelvin Cellar, MD   650 mg at 06/03/15 9741   Or  . acetaminophen (TYLENOL) suppository 650 mg  650 mg Rectal Q6H PRN Kelvin Cellar, MD      . feeding supplement (ENSURE ENLIVE) (ENSURE ENLIVE) liquid 237 mL  237 mL Oral BID BM Kelvin Cellar, MD   237 mL at 06/06/15 1000  . heparin ADULT infusion 100 units/mL (25000 units/250 mL)  750 Units/hr Intravenous Continuous DEAKEN JURGENS, RPH 7.5 mL/hr at 06/06/15 0939 750 Units/hr at 06/06/15 0939  . heparin injection 2,000 Units  2,000 Units Dialysis PRN Roney Jaffe, MD      . insulin aspart (novoLOG) injection 0-15 Units  0-15 Units Subcutaneous TID WC Kelvin Cellar, MD   5 Units at 06/04/15 1800  . insulin aspart (novoLOG) injection 0-5 Units  0-5 Units Subcutaneous QHS Kelvin Cellar, MD   3 Units at 06/03/15 2217  . isosorbide mononitrate (IMDUR) 24 hr tablet 30 mg  30 mg Oral Daily Kelvin Cellar, MD   30 mg at 06/05/15 2200  . morphine 2 MG/ML injection 2 mg  2 mg Intravenous Q4H PRN Kelvin Cellar, MD      . ondansetron [facility]) tablet 4  mg  4 mg Oral Q6H PRN Kelvin Cellar, MD       Or  . ondansetron (ZOFRAN) injection 4 mg  4 mg Intravenous Q6H PRN Kelvin Cellar, MD      . oxyCODONE (Oxy IR/ROXICODONE) immediate release tablet 5 mg  5 mg Oral Q4H PRN Kelvin Cellar, MD      . sodium bicarbonate tablet 650 mg  650 mg Oral TID Edrick Oh, MD   650 mg at 06/06/15 0939  . sodium chloride flush (NS) 0.9 % injection 10-40 mL  10-40 mL Intracatheter PRN Kelvin Cellar, MD   10 mL at 06/06/15 0500  . sodium chloride flush (NS) 0.9 % injection 3 mL  3 mL Intravenous Q12H Kelvin Cellar, MD   3 mL at 06/03/15 1000  . THROMBI-PAD (THROMBI-PAD) 3"X3" pad 1 each  1 each Topical Once Jeryl Columbia, NP         REVIEW OF SYSTEMS:  [X]  denotes positive finding, [ ]  denotes negative finding Cardiac  Comments:  Chest pain or chest pressure:    Shortness of breath upon exertion:    Short of breath when lying flat:    Irregular heart rhythm:        Vascular    Pain in calf, thigh, or hip brought on by ambulation:    Pain in feet at night that wakes you up from your sleep:     Blood clot in your veins:    Leg swelling:         Pulmonary    Oxygen at home:    Productive cough:     Wheezing:         Neurologic    Sudden weakness in arms or legs:     Sudden numbness in arms or legs:     Sudden onset of difficulty speaking or slurred speech:    Temporary loss of vision in one eye:     Problems with dizziness:         Gastrointestinal    Blood in stool:     Vomited blood:         Genitourinary    Burning when urinating:     Blood in urine:        Psychiatric    Major depression:         Hematologic    Bleeding problems:    Problems with blood clotting too easily:        Skin    Rashes or ulcers:        Constitutional    Fever or chills:      PHYSICAL EXAM: Filed Vitals:   06/06/15 1330 06/06/15 1400 06/06/15 1430 06/06/15 1500  BP: 155/83 156/84 164/84 150/82  Pulse: 76 73 73 73  Temp:      TempSrc:      Resp: 18 18 19 19   Height:      Weight:      SpO2:        GENERAL: The patient is an ill-appearing male in no acute distress. The vital signs are documented above. CARDIAC: There is a regular rate and rhythm.  VASCULAR: 2+ radial pulses bilaterally. 2+ left ulnar pulse. 2+ dorsalis pedis pulses bilaterally. Right IJ TDC. Left chest port.  PULMONARY: There is good air exchange bilaterally without wheezing or rales. MUSCULOSKELETAL: Bilateral pedal edema. Left leg is larger than right.  NEUROLOGIC: No focal weakness or paresthesias are detected. SKIN: There are no ulcers or rashes noted. PSYCHIATRIC: The  patient has a normal affect.  DATA:  Vein mapping  pending  MEDICAL ISSUES: ESRD on HD Other active problems: Multiple myeloma undergoing chemotherapy, history of DVT, hypertension, diabetes  Await results of vein mapping. The patient is currently dialyzing via a right IJ tunneled dialysis catheter placed by IR. He does have a left chest port. Will make recommendations for access once results of vein mapping are back.    Virgina Jock, PA-C [facility] [place]

## 2015-06-06 NOTE — Progress Notes (Addendum)
Patient called for RN to come to his room since he was bleeding. Upon entry to patient's room, patient was standing at the sink, blood (approximately 77ml) seen in patient's gown and on bed. Assessed bleeding site as the right hemodialysis catheter. Heparin drip stopped for now NP on call notified and okay with it. IV team notified as well. Pressure dressing and ice pack applied. Thrombi pad ordered to be put on site if bleeding does not stop. Vitals as follows, BP=153/72, Tempt=98.6, HR=79, RR=20, O2 sat =98 on room air. Will continue to monitor.

## 2015-06-07 ENCOUNTER — Inpatient Hospital Stay (HOSPITAL_COMMUNITY): Payer: Medicare Other

## 2015-06-07 DIAGNOSIS — I472 Ventricular tachycardia: Secondary | ICD-10-CM

## 2015-06-07 LAB — GLUCOSE, CAPILLARY
GLUCOSE-CAPILLARY: 129 mg/dL — AB (ref 65–99)
GLUCOSE-CAPILLARY: 133 mg/dL — AB (ref 65–99)
Glucose-Capillary: 160 mg/dL — ABNORMAL HIGH (ref 65–99)

## 2015-06-07 LAB — RENAL FUNCTION PANEL
Albumin: 2.3 g/dL — ABNORMAL LOW (ref 3.5–5.0)
Anion gap: 10 (ref 5–15)
BUN: 19 mg/dL (ref 6–20)
CALCIUM: 8 mg/dL — AB (ref 8.9–10.3)
CO2: 29 mmol/L (ref 22–32)
CREATININE: 4.53 mg/dL — AB (ref 0.61–1.24)
Chloride: 102 mmol/L (ref 101–111)
GFR calc non Af Amer: 12 mL/min — ABNORMAL LOW (ref >60)
GFR, EST AFRICAN AMERICAN: 14 mL/min — AB (ref >60)
GLUCOSE: 118 mg/dL — AB (ref 65–99)
Phosphorus: 2.9 mg/dL (ref 2.5–4.6)
Potassium: 3.4 mmol/L — ABNORMAL LOW (ref 3.5–5.1)
SODIUM: 141 mmol/L (ref 135–145)

## 2015-06-07 LAB — HEPARIN LEVEL (UNFRACTIONATED): Heparin Unfractionated: 0.15 IU/mL — ABNORMAL LOW (ref 0.30–0.70)

## 2015-06-07 LAB — CBC
HCT: 23.6 % — ABNORMAL LOW (ref 39.0–52.0)
HEMOGLOBIN: 7.6 g/dL — AB (ref 13.0–17.0)
MCH: 25.9 pg — ABNORMAL LOW (ref 26.0–34.0)
MCHC: 32.2 g/dL (ref 30.0–36.0)
MCV: 80.5 fL (ref 78.0–100.0)
PLATELETS: 96 10*3/uL — AB (ref 150–400)
RBC: 2.93 MIL/uL — ABNORMAL LOW (ref 4.22–5.81)
RDW: 16 % — ABNORMAL HIGH (ref 11.5–15.5)
WBC: 2.3 10*3/uL — ABNORMAL LOW (ref 4.0–10.5)

## 2015-06-07 LAB — ECHOCARDIOGRAM COMPLETE
HEIGHTINCHES: 71 in
Weight: 2709.01 oz

## 2015-06-07 MED ORDER — LIDOCAINE-PRILOCAINE 2.5-2.5 % EX CREA: 1.0000 "application " | TOPICAL_CREAM | CUTANEOUS | Status: DC | PRN

## 2015-06-07 MED ORDER — HEPARIN SODIUM (PORCINE) 1000 UNIT/ML DIALYSIS: 2000.0000 [IU] | INTRAMUSCULAR | Status: DC | PRN

## 2015-06-07 MED ORDER — SODIUM CHLORIDE 0.9 % IV SOLN: 100.0000 mL | INTRAVENOUS | Status: DC | PRN

## 2015-06-07 MED ORDER — LIDOCAINE HCL (PF) 1 % IJ SOLN: 5.0000 mL | INTRAMUSCULAR | Status: DC | PRN

## 2015-06-07 MED ORDER — DEXTROSE 5 % IV SOLN
1.5000 g | INTRAVENOUS | Status: AC
  Administered 2015-06-08: 1.5 g via INTRAVENOUS

## 2015-06-07 MED ORDER — HEPARIN SODIUM (PORCINE) 1000 UNIT/ML DIALYSIS: 1000.0000 [IU] | INTRAMUSCULAR | Status: DC | PRN

## 2015-06-07 MED ORDER — ALTEPLASE 2 MG IJ SOLR: 2.0000 mg | Freq: Once | INTRAMUSCULAR | Status: DC | PRN

## 2015-06-07 NOTE — Progress Notes (Signed)
Called Interventional radiology, RN was told the PA will come see the patient this morning. Morning RN updated.

## 2015-06-07 NOTE — Progress Notes (Signed)
  [facility] Progress Note   Subjective: Energy better, still no appetite.   Filed Vitals:   06/07/15 0506 06/07/15 1042 06/07/15 1053 06/07/15 1100  BP: 157/75 146/74 153/83 158/86  Pulse: 81 77 70 71  Temp: 98.9 F (37.2 C) 98.7 F (37.1 C)    TempSrc: Oral Oral    Resp: 20 18    Height:      Weight:  73 kg (160 lb 15 oz)    SpO2: 98%       Inpatient medications: . feeding supplement (ENSURE ENLIVE)  237 mL Oral BID BM  . insulin aspart  0-15 Units Subcutaneous TID WC  . insulin aspart  0-5 Units Subcutaneous QHS  . isosorbide mononitrate  30 mg Oral Daily  . sodium bicarbonate  650 mg Oral TID  . sodium chloride flush  3 mL Intravenous Q12H  . THROMBI-PAD  1 each Topical Once   . heparin Stopped (06/07/15 0600)   sodium chloride, sodium chloride, acetaminophen **OR** acetaminophen, alteplase, heparin, [START ON 06/08/2015] heparin, lidocaine (PF), lidocaine-prilocaine, morphine injection, ondansetron **OR** ondansetron (ZOFRAN) IV, oxyCODONE, sodium chloride flush  Exam: General: Alert, wdwn, no distress Neck: no jvd Lungs: clear bilat Heart: RRR no mrg Abdomen: Soft, ntnd +bs Extremities: 2+ LE edema  UA negative Renal US > 11-13 cm kidneys, no hydro, ^'d echo     Assessment:  1. Acute/ subacute renal failure - creat worsening starting in March/ April this year. Now presented with creat 10, normal UA/ ultrasound, renal function did not improve w vol loading.  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  Unlikely to benefit from pheresis.  Mainstay of Rx from renal standpoint is dialysis.  VVS seeing for perm access and CLIP process started. 3rd HD today, then Sat. Have d/w patient and d/w pts son as well. For dc once perm access and CLIP in place.   2. Hypertension stopped acei 3. Volume excess/ LE edema - get down w HD 4. Anemia - severe, from myeloma + CKD.  With history of cancer and  DVT, risks of ESA prob outweighs benefit. Transfuse prn.   5. Multiple myeloma - refractory to mult regimens and sp SCT 2012   Plan - as above   Kelly Splinter MD [place] pager [phone]    cell [phone] 06/07/2015, 11:06 AM    Recent Labs Lab 06/04/15 0600 06/05/15 1841 06/06/15 0605  NA 138 141 141  K 5.2* 4.0 3.5  CL 110 111 102  CO2 18* 17* 24  GLUCOSE 138* 97 88  BUN 81* 71* 32*  CREATININE 9.44* 9.36* 5.49*  CALCIUM 7.1* 7.3* 7.6*    Recent Labs Lab 06/01/15 0744  AST 30  ALT 9  ALKPHOS 49  BILITOT 1.35*  PROT 7.2  ALBUMIN 2.7*    Recent Labs Lab 06/04/15 0600 06/05/15 1841 06/06/15 0605  WBC 2.9* 2.2* 2.1*  HGB 7.8* 7.8* 7.0*  HCT 22.7* 22.6* 21.1*  MCV 78.0 79.9 79.6  PLT 92* 90* 92*

## 2015-06-07 NOTE — Progress Notes (Signed)
ANTICOAGULATION CONSULT NOTE - Follow Up Consult  Pharmacy Consult for Heparin Indication: Hx of DVT (rivaroxaban held for renal failure)  No Known Allergies  Patient Measurements: Height: 5\' 11"  (180.3 cm) Weight: 169 lb 5 oz (76.8 kg) (standing weight) IBW/kg (Calculated) : 75.3 Heparin Dosing Weight: actual weight 78.4 kg  Vital Signs: Temp: 97.7 F (36.5 C) (05/17 2014) Temp Source: Oral (05/17 2014) BP: 133/60 mmHg (05/17 2014) Pulse Rate: 83 (05/17 2014)  Labs:  Recent Labs  06/04/15 0600  06/05/15 0420 06/05/15 1841 06/06/15 0605 06/06/15 2341  HGB 7.8*  --   --  7.8* 7.0*  --   HCT 22.7*  --   --  22.6* 21.1*  --   PLT 92*  --   --  90* 92*  --   HEPARINUNFRC 0.72*  < > 0.34  --  0.15* 0.15*  CREATININE 9.44*  --   --  9.36* 5.49*  --   < > = values in this interval not displayed.  Estimated Creatinine Clearance: 13.5 mL/min (by C-G formula based on Cr of 5.49).   Medications:  Infusions:  . heparin 750 Units/hr (06/06/15 1646)    Assessment: 89 yoM admitted on 5/12 with acute on CKD (baseline SCr 1.6). PMH includes MM on Velcade/daratumumab/dexamethasone chemotherapy regimen, DVT (dx 06/2014) on chronic Xarelto anticoagulation. Rivaroxaban is held for renal failure and pharmacy consulted to dose heparin. Plan to transition to po warfarin once procedures complete. Noted that heparin gtt was held 5/17 a.m. for bleeding from pt's HD cath placed 5/16. However, bleeding resolved and heparin gtt was restarted ~1000. Heparin level remains subtherapeutic on 750 units/hr. No issues with line or bleeding reported per RN. Hgb down to 7 this a.m. - watch closely.  Goal of Therapy:  Heparin level 0.3-0.7 units/ml Monitor platelets by anticoagulation protocol: Yes   Plan: Increase IV heparin drip to 900 units/hr F/u 8hr heparin level F/u transition to warfarin  Sherlon Handing, PharmD, BCPS Clinical pharmacist, pager [phone] 06/07/2015,12:45 AM.

## 2015-06-07 NOTE — Progress Notes (Signed)
Patient ID: Eddie Thomas, male   DOB: 29-Oct-1945, 70 y.o.   MRN: [id number] Vascular Surgery Progress Note  Subjective: End-stage renal disease-needs vascular access  Objective:  Filed Vitals:   06/06/15 2014 06/07/15 0506  BP: 133/60 157/75  Pulse: 83 81  Temp: 97.7 F (36.5 C) 98.9 F (37.2 C)  Resp: 20 20    No change from previous exam  No edema and left upper extremity and 3+ pulses palpable   Labs:  Recent Labs Lab 06/04/15 0600 06/05/15 1841 06/06/15 0605  CREATININE 9.44* 9.36* 5.49*    Recent Labs Lab 06/04/15 0600 06/05/15 1841 06/06/15 0605  NA 138 141 141  K 5.2* 4.0 3.5  CL 110 111 102  CO2 18* 17* 24  BUN 81* 71* 32*  CREATININE 9.44* 9.36* 5.49*  GLUCOSE 138* 97 88  CALCIUM 7.1* 7.3* 7.6*    Recent Labs Lab 06/04/15 0600 06/05/15 1841 06/06/15 0605  WBC 2.9* 2.2* 2.1*  HGB 7.8* 7.8* 7.0*  HCT 22.7* 22.6* 21.1*  PLT 92* 90* 92*   No results for input(s): INR in the last 168 hours.  I/O last 3 completed shifts: In: 1481.9 [P.O.:300; I.V.:1181.9] Out: 2500 [Other:2500]  Imaging: Ir Fluoro Guide Cv Line Right  06/05/2015  INDICATION: Acute kidney injury, multiple myeloma, renal failure EXAM: ULTRASOUND GUIDANCE FOR VASCULAR ACCESS RIGHT INTERNAL JUGULAR PERMANENT HEMODIALYSIS CATHETER Date:  5/16/20175/16/2017 3:53 pm Radiologist:  M. Daryll Brod, MD Guidance:  Ultrasound and fluoroscopic FLUOROSCOPY TIME:  Fluoroscopy Time:  30 seconds (2 mGy). MEDICATIONS: 1% lidocaine locally, 2 g Ancef administered within 1 hour of the procedure ANESTHESIA/SEDATION: Versed 0.5 mg IV; Fentanyl 25 mcg IV; Moderate Sedation Time:  15 minutes The patient was continuously monitored during the procedure by the interventional radiology nurse under my direct supervision. CONTRAST:  None COMPLICATIONS: None immediate. PROCEDURE: Informed consent was obtained from the patient following explanation of the procedure, risks, benefits and alternatives. The patient  understands, agrees and consents for the procedure. All questions were addressed. A time out was performed. Maximal barrier sterile technique utilized including caps, mask, sterile gowns, sterile gloves, large sterile drape, hand hygiene, and 2% chlorhexidine scrub. Under sterile conditions and local anesthesia, right internal jugular micropuncture venous access was performed with ultrasound. Images were obtained for documentation of the patent right internal jugular vein. A guide wire was inserted followed by a transitional dilator. Next, a 0.035 guidewire was advanced into the IVC with a 5-French catheter. Measurements were obtained from the right venotomy site to the proximal right atrium. In the right infraclavicular chest, a subcutaneous tunnel was created under sterile conditions and local anesthesia. 1% lidocaine with epinephrine was utilized for this. The 19 cm tip to cuff palindrome catheter was tunneled subcutaneously to the venotomy site and inserted into the SVC/RA junction through a valved peel-away sheath. Position was confirmed with fluoroscopy. Images were obtained for documentation. Blood was aspirated from the catheter followed by saline and heparin flushes. The appropriate volume and strength of heparin was instilled in each lumen. Caps were applied. The catheter was secured at the tunnel site with Gelfoam and a pursestring suture. The venotomy site was closed with subcuticular Vicryl suture. Dermabond was applied to the small right neck incision. A dry sterile dressing was applied. The catheter is ready for use. No immediate complications. IMPRESSION: Ultrasound and fluoroscopically guided right internal jugular tunneled hemodialysis catheter (19 cm tip to cuff palindrome catheter). Electronically Signed   By: Jerilynn Mages.  Shick M.D.   On:  06/05/2015 16:34   Ir US Guide Vasc Access Right  06/05/2015  INDICATION: Acute kidney injury, multiple myeloma, renal failure EXAM: ULTRASOUND GUIDANCE FOR VASCULAR  ACCESS RIGHT INTERNAL JUGULAR PERMANENT HEMODIALYSIS CATHETER Date:  5/16/20175/16/2017 3:53 pm Radiologist:  M. Daryll Brod, MD Guidance:  Ultrasound and fluoroscopic FLUOROSCOPY TIME:  Fluoroscopy Time:  30 seconds (2 mGy). MEDICATIONS: 1% lidocaine locally, 2 g Ancef administered within 1 hour of the procedure ANESTHESIA/SEDATION: Versed 0.5 mg IV; Fentanyl 25 mcg IV; Moderate Sedation Time:  15 minutes The patient was continuously monitored during the procedure by the interventional radiology nurse under my direct supervision. CONTRAST:  None COMPLICATIONS: None immediate. PROCEDURE: Informed consent was obtained from the patient following explanation of the procedure, risks, benefits and alternatives. The patient understands, agrees and consents for the procedure. All questions were addressed. A time out was performed. Maximal barrier sterile technique utilized including caps, mask, sterile gowns, sterile gloves, large sterile drape, hand hygiene, and 2% chlorhexidine scrub. Under sterile conditions and local anesthesia, right internal jugular micropuncture venous access was performed with ultrasound. Images were obtained for documentation of the patent right internal jugular vein. A guide wire was inserted followed by a transitional dilator. Next, a 0.035 guidewire was advanced into the IVC with a 5-French catheter. Measurements were obtained from the right venotomy site to the proximal right atrium. In the right infraclavicular chest, a subcutaneous tunnel was created under sterile conditions and local anesthesia. 1% lidocaine with epinephrine was utilized for this. The 19 cm tip to cuff palindrome catheter was tunneled subcutaneously to the venotomy site and inserted into the SVC/RA junction through a valved peel-away sheath. Position was confirmed with fluoroscopy. Images were obtained for documentation. Blood was aspirated from the catheter followed by saline and heparin flushes. The appropriate volume  and strength of heparin was instilled in each lumen. Caps were applied. The catheter was secured at the tunnel site with Gelfoam and a pursestring suture. The venotomy site was closed with subcuticular Vicryl suture. Dermabond was applied to the small right neck incision. A dry sterile dressing was applied. The catheter is ready for use. No immediate complications. IMPRESSION: Ultrasound and fluoroscopically guided right internal jugular tunneled hemodialysis catheter (19 cm tip to cuff palindrome catheter). Electronically Signed   By: Jerilynn Mages.  Shick M.D.   On: 06/05/2015 16:34    Assessment/Plan:    LOS: 6 days  s/p   Await results of vein mapping today We will attempt to perform left arm AV fistula tomorrow but surgery schedule is quite full and this may not be able to be performed until first of the week We'll keep patient nothing by mouth performed tomorrow if possible   Tinnie Gens, MD 06/07/2015 7:29 AM

## 2015-06-07 NOTE — Progress Notes (Signed)
Nutrition Brief Note  Attempted to provide Mr. Sassaman with ESRD-HD Diet Education but patient declined at this time. Appeared to be in a significant amount of pain with hiccups. Requested RD return at a later time. Consult will be completed tomorrow.  Satira Anis. Raquon Milledge, MS, RD LDN Inpatient Clinical Dietitian Pager [phone]

## 2015-06-07 NOTE — Progress Notes (Signed)
IR asked to recheck (R)IJ PC site as has continued to have some bleeding/oozing since placement. Hep gtt has been stopped.  Dressing taken down, clot on dressing but no apparent active bleeding coming from skin site. Biopatch intact and left alone. No hematoma of the tunnel/tract to the IJ. IJ venotomy site looks good.  Replaced sterile dressing.  Recommend cont to hold heparin today SCDs for VTE prophylaxis given hx of DVT last year. If no further bleeding today, can resume heparin in am tomorrow.   Ascencion Dike PA-C Interventional Radiology 06/07/2015 10:36 AM

## 2015-06-07 NOTE — Progress Notes (Signed)
PROGRESS NOTE    Eddie Thomas  PPI:[id number] DOB: 1945/12/29 DOA: 06/01/2015 PCP: [name]   Outpatient Specialists: Oncology dr Julien Nordmann.   Brief Narrative: Eddie Thomas is a 70 y.o. male with medical history significant of multiple myeloma, IgA subtype, currently undergoing treatment with Velcade/Daratumumab at the cancer center, follows Dr. Julien Nordmann of medical oncology, also having a history of chronic kidney disease (with baseline creatinine near 1.6-2.0), hypertension, diabetes mellitus, presented as a transfer from the cancer center for acute renal failure with creatinine of 10. Renal consulted and initiated hemodialysis.  Assessment & Plan:   Principal Problem:   Acute on chronic renal failure (HCC) Active Problems:   Multiple myeloma (HCC)   Hypertension   Bone marrow transplant complication (HCC)   Dehydration   Hypoalbuminemia due to protein-calorie malnutrition (HCC)   Antineoplastic chemotherapy induced pancytopenia ([place])   Long term current use of anticoagulant therapy   Acute renal failure (ARF) (HCC)   Acute renal failure complicating chronic kidney disease: - Nephrology was consulted. As per nephrology, creatinine started to rise in March-April 2017 up to 1.4-1.8 range with some bumps up to 2.3-2.5. On May 8, creatinine was 3.3. Now admitted with creatinine of 10. 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 Patient's myeloma is refractory to multiple different chemotherapy combinations and do not believe that his chemotherapy or pheresis is going to help his renal function. - HD catheter placed and patient was started on hemodialysis 5/16. - Discussed with Dr. Jonnie Finner on 5/17: Prognosis for dialysis in myeloma patients is not great and he suggests considering palliative care input- discussed with Dr. Julien Nordmann, primary oncologist who will pursue palliative care consultation as outpatient as deemed necessary. - [facility] being consulted for PERM access >possible AV  fistula by vascular surgery on 5/19 based on OR availability. Patient underwent HD 5/16-5/18 and then will get on 5/20  Multiple Myeloma: - Follows with Dr Julien Nordmann.   Anemia - due to multiple myeloma, transfused 2 units PRBC, monitor hemoglobin closely hemoglobin in the 7.8 range for last 3 days and has dropped to 7 on 5/17. Patient had some bleeding from HD catheter overnight 5/16. This has stopped. Follow CBC in a.m. and consider transfusing if hemoglobin less than 7 g per DL. - Hemoglobin better at 7.6.  Pancytopenia - Secondary to multiple myeloma and related cancer chemotherapy. Platelets stable in the 90s. WBC stable in the 2 range. Anemia management as above.  Hyperkalemia - Resolved after dialysis. Mild hypokalemia: 3.4  NSVT: - Patient had 15 beats of nonsustained V. tach, hypomagnesemia corrected, 2-D echo done 11/2015 with EF 55-60% - Continue with telemetry monitor, would consider low-dose beta blocker if recurrence - May be related to renal failure, electrolyte abnormalities. Repeat 2-D echo to reassess LV function - results pending.  History of DVT - Xarelto DC'ed given renal function - Discussed with Dr. Julien Nordmann on 5/17 and he recommended continued anticoagulation. Patient had been started on IV heparin which was held on 5/18 a.m. due to mild bleeding from dialysis catheter site. Discussed with IR who recommend holding off on IV heparin/anticoagulation until 5/19. Also scheduled for AV fistula placement tomorrow. Consider starting IV heparin bridging and Coumadin post-AV fistula tomorrow. SCDs interim.  Essential hypertension - ACEI stopped due to acute renal failure.      DVT prophylaxis: Heparin GTT/SCDs Code Status: Full Family Communication: Discussed with patient's son at bedside on 5/17. None at bedside today. Disposition Plan: DC home when medically stable, may not be until early  next week.   Consultants:   Renal.  Oncology  IR  Vascular  surgery  Procedures:   Right IJ HD catheter insertion by interventional radiology on 5/16  Hemodialysis initiated 5/16   Antimicrobials:  None  Subjective: Decreased appetite. Mild intermittent hiccups.  Objective: Filed Vitals:   06/07/15 1130 06/07/15 1200 06/07/15 1230 06/07/15 1400  BP: 180/90 191/93 174/87 157/82  Pulse: 68 70 66 68  Temp:      TempSrc:      Resp:      Height:      Weight:      SpO2:      Temperature 98.8F, respiratory rate 18, oxygen saturation 98%.  Intake/Output Summary (Last 24 hours) at 06/07/15 1438 Last data filed at 06/07/15 0647  Gross per 24 hour  Intake  252.5 ml  Output   2500 ml  Net -2247.5 ml   Filed Weights   06/05/15 2122 06/06/15 1247 06/07/15 1042  Weight: 85.5 kg (188 lb 7.9 oz) 76.8 kg (169 lb 5 oz) 73 kg (160 lb 15 oz)    Examination:  General exam: Pleasant middle-aged male lying comfortably supine in bed undergoing sonography/vein mapping Respiratory system: Clear to auscultation. Respiratory effort normal. Left upper chest Port-A-Cath. Right upper chest IJ HD catheter site dressing clean and dry. Cardiovascular system: S1 & S2 heard, RRR. No JVD, murmurs, rubs, gallops or clicks. No pedal edema. Telemetry: Sinus rhythm. 16 beat NSVT versus PVCs on 5/16 at 4:19 AM and had another 17 beat NSVT on 5/13.No further NSVT. Gastrointestinal system: Abdomen is nondistended, soft and nontender. No organomegaly or masses felt. Normal bowel sounds heard. Central nervous system: Alert and oriented. No focal neurological deficits. Extremities: Symmetric 5 x 5 power. Skin: No rashes, lesions or ulcers Psychiatry: Judgement and insight appear normal. Mood & affect appropriate.     Data Reviewed: I have personally reviewed following labs and imaging studies  CBC:  Recent Labs Lab 06/03/15 2000 06/04/15 0600 06/05/15 1841 06/06/15 0605 06/07/15 1050  WBC 2.7* 2.9* 2.2* 2.1* 2.3*  HGB 7.8* 7.8* 7.8* 7.0* 7.6*  HCT 22.4*  22.7* 22.6* 21.1* 23.6*  MCV 78.6 78.0 79.9 79.6 80.5  PLT 92* 92* 90* 92* 96*   Basic Metabolic Panel:  Recent Labs Lab 06/02/15 1130 06/03/15 0935 06/04/15 0600 06/05/15 1841 06/06/15 0605 06/07/15 1050  NA  --  136 138 141 141 141  K  --  4.2 5.2* 4.0 3.5 3.4*  CL  --  107 110 111 102 102  CO2  --  16* 18* 17* 24 29  GLUCOSE  --  145* 138* 97 88 118*  BUN  --  65* 81* 71* 32* 19  CREATININE  --  9.48* 9.44* 9.36* 5.49* 4.53*  CALCIUM  --  7.1* 7.1* 7.3* 7.6* 8.0*  MG 1.2* 1.9  --   --   --   --   PHOS  --   --   --   --   --  2.9   GFR: Estimated Creatinine Clearance: 15.9 mL/min (by C-G formula based on Cr of 4.53). Liver Function Tests:  Recent Labs Lab 06/01/15 0744 06/07/15 1050  AST 30  --   ALT 9  --   ALKPHOS 49  --   BILITOT 1.35*  --   PROT 7.2  --   ALBUMIN 2.7* 2.3*   No results for input(s): LIPASE, AMYLASE in the last 168 hours. No results for input(s): AMMONIA in the last 168 hours.  Coagulation Profile: No results for input(s): INR, PROTIME in the last 168 hours. Cardiac Enzymes:  Recent Labs Lab 06/01/15 1230  CKTOTAL 45*   BNP (last 3 results) No results for input(s): PROBNP in the last 8760 hours. HbA1C: No results for input(s): HGBA1C in the last 72 hours. CBG:  Recent Labs Lab 06/06/15 0726 06/06/15 1136 06/06/15 1702 06/06/15 2013 06/07/15 0741  GLUCAP 95 196* 83 96 129*   Lipid Profile: No results for input(s): CHOL, HDL, LDLCALC, TRIG, CHOLHDL, LDLDIRECT in the last 72 hours. Thyroid Function Tests: No results for input(s): TSH, T4TOTAL, FREET4, T3FREE, THYROIDAB in the last 72 hours. Anemia Panel: No results for input(s): VITAMINB12, FOLATE, FERRITIN, TIBC, IRON, RETICCTPCT in the last 72 hours. Urine analysis:    Component Value Date/Time   COLORURINE YELLOW 06/01/2015 [place] 06/01/2015 1540   LABSPEC 1.008 06/01/2015 1540   PHURINE 6.0 06/01/2015 1540   GLUCOSEU NEGATIVE 06/01/2015 1540    HGBUR TRACE* 06/01/2015 1540   BILIRUBINUR NEGATIVE 06/01/2015 1540   KETONESUR NEGATIVE 06/01/2015 1540   PROTEINUR 30* 06/01/2015 1540   UROBILINOGEN 1.0 01/16/2012 1902   NITRITE NEGATIVE 06/01/2015 1540   LEUKOCYTESUR NEGATIVE 06/01/2015 1540   Sepsis Labs: No results for input(s): PROCALCITON, LATICACIDVEN in the last 168 hours.  No results found for this or any previous visit (from the past 240 hour(s)).       Radiology Studies: Ir Fluoro Guide Cv Line Right  06/05/2015  INDICATION: Acute kidney injury, multiple myeloma, renal failure EXAM: ULTRASOUND GUIDANCE FOR VASCULAR ACCESS RIGHT INTERNAL JUGULAR PERMANENT HEMODIALYSIS CATHETER Date:  5/16/20175/16/2017 3:53 pm Radiologist:  M. Daryll Brod, MD Guidance:  Ultrasound and fluoroscopic FLUOROSCOPY TIME:  Fluoroscopy Time:  30 seconds (2 mGy). MEDICATIONS: 1% lidocaine locally, 2 g Ancef administered within 1 hour of the procedure ANESTHESIA/SEDATION: Versed 0.5 mg IV; Fentanyl 25 mcg IV; Moderate Sedation Time:  15 minutes The patient was continuously monitored during the procedure by the interventional radiology nurse under my direct supervision. CONTRAST:  None COMPLICATIONS: None immediate. PROCEDURE: Informed consent was obtained from the patient following explanation of the procedure, risks, benefits and alternatives. The patient understands, agrees and consents for the procedure. All questions were addressed. A time out was performed. Maximal barrier sterile technique utilized including caps, mask, sterile gowns, sterile gloves, large sterile drape, hand hygiene, and 2% chlorhexidine scrub. Under sterile conditions and local anesthesia, right internal jugular micropuncture venous access was performed with ultrasound. Images were obtained for documentation of the patent right internal jugular vein. A guide wire was inserted followed by a transitional dilator. Next, a 0.035 guidewire was advanced into the IVC with a 5-French catheter.  Measurements were obtained from the right venotomy site to the proximal right atrium. In the right infraclavicular chest, a subcutaneous tunnel was created under sterile conditions and local anesthesia. 1% lidocaine with epinephrine was utilized for this. The 19 cm tip to cuff palindrome catheter was tunneled subcutaneously to the venotomy site and inserted into the SVC/RA junction through a valved peel-away sheath. Position was confirmed with fluoroscopy. Images were obtained for documentation. Blood was aspirated from the catheter followed by saline and heparin flushes. The appropriate volume and strength of heparin was instilled in each lumen. Caps were applied. The catheter was secured at the tunnel site with Gelfoam and a pursestring suture. The venotomy site was closed with subcuticular Vicryl suture. Dermabond was applied to the small right neck incision. A dry sterile dressing was applied. The catheter  is ready for use. No immediate complications. IMPRESSION: Ultrasound and fluoroscopically guided right internal jugular tunneled hemodialysis catheter (19 cm tip to cuff palindrome catheter). Electronically Signed   By: Jerilynn Mages.  Shick M.D.   On: 06/05/2015 16:34   Ir US Guide Vasc Access Right  06/05/2015  INDICATION: Acute kidney injury, multiple myeloma, renal failure EXAM: ULTRASOUND GUIDANCE FOR VASCULAR ACCESS RIGHT INTERNAL JUGULAR PERMANENT HEMODIALYSIS CATHETER Date:  5/16/20175/16/2017 3:53 pm Radiologist:  M. Daryll Brod, MD Guidance:  Ultrasound and fluoroscopic FLUOROSCOPY TIME:  Fluoroscopy Time:  30 seconds (2 mGy). MEDICATIONS: 1% lidocaine locally, 2 g Ancef administered within 1 hour of the procedure ANESTHESIA/SEDATION: Versed 0.5 mg IV; Fentanyl 25 mcg IV; Moderate Sedation Time:  15 minutes The patient was continuously monitored during the procedure by the interventional radiology nurse under my direct supervision. CONTRAST:  None COMPLICATIONS: None immediate. PROCEDURE: Informed consent  was obtained from the patient following explanation of the procedure, risks, benefits and alternatives. The patient understands, agrees and consents for the procedure. All questions were addressed. A time out was performed. Maximal barrier sterile technique utilized including caps, mask, sterile gowns, sterile gloves, large sterile drape, hand hygiene, and 2% chlorhexidine scrub. Under sterile conditions and local anesthesia, right internal jugular micropuncture venous access was performed with ultrasound. Images were obtained for documentation of the patent right internal jugular vein. A guide wire was inserted followed by a transitional dilator. Next, a 0.035 guidewire was advanced into the IVC with a 5-French catheter. Measurements were obtained from the right venotomy site to the proximal right atrium. In the right infraclavicular chest, a subcutaneous tunnel was created under sterile conditions and local anesthesia. 1% lidocaine with epinephrine was utilized for this. The 19 cm tip to cuff palindrome catheter was tunneled subcutaneously to the venotomy site and inserted into the SVC/RA junction through a valved peel-away sheath. Position was confirmed with fluoroscopy. Images were obtained for documentation. Blood was aspirated from the catheter followed by saline and heparin flushes. The appropriate volume and strength of heparin was instilled in each lumen. Caps were applied. The catheter was secured at the tunnel site with Gelfoam and a pursestring suture. The venotomy site was closed with subcuticular Vicryl suture. Dermabond was applied to the small right neck incision. A dry sterile dressing was applied. The catheter is ready for use. No immediate complications. IMPRESSION: Ultrasound and fluoroscopically guided right internal jugular tunneled hemodialysis catheter (19 cm tip to cuff palindrome catheter). Electronically Signed   By: Jerilynn Mages.  Shick M.D.   On: 06/05/2015 16:34        Scheduled Meds: .  [START ON 06/08/2015] cefUROXime (ZINACEF)  IV  1.5 g Intravenous To SS-Surg  . feeding supplement (ENSURE ENLIVE)  237 mL Oral BID BM  . insulin aspart  0-15 Units Subcutaneous TID WC  . insulin aspart  0-5 Units Subcutaneous QHS  . isosorbide mononitrate  30 mg Oral Daily  . sodium chloride flush  3 mL Intravenous Q12H  . THROMBI-PAD  1 each Topical Once   Continuous Infusions:     LOS: 6 days    Time spent: 25 minutes.     [facility], MD [facility] Pager [phone]  If 7PM-7AM, please contact night-coverage [email or web address] Password [facility] 06/07/2015, 2:38 PM

## 2015-06-07 NOTE — Progress Notes (Signed)
Patient's right HD catheter site bleeding this morning, pressure dressing (4x4 gauze) soaked with blood (approximately 50 ml blood loss). Heparin drip stopped for now, NP on call notified.IV team notified, dressing reinforced, ice pack applied. Interventional radiology called per NP's advice. Unable to reach Interventional radiology. Will call again.

## 2015-06-08 ENCOUNTER — Inpatient Hospital Stay (HOSPITAL_COMMUNITY): Payer: Medicare Other | Admitting: Certified Registered"

## 2015-06-08 ENCOUNTER — Ambulatory Visit: Payer: Medicare Other

## 2015-06-08 ENCOUNTER — Inpatient Hospital Stay (HOSPITAL_COMMUNITY): Payer: Medicare Other

## 2015-06-08 ENCOUNTER — Encounter (HOSPITAL_COMMUNITY)
Admission: AD | Disposition: A | Discharge status: Hospice | Payer: Self-pay | Source: Ambulatory Visit | Attending: Internal Medicine

## 2015-06-08 DIAGNOSIS — N186 End stage renal disease: Secondary | ICD-10-CM

## 2015-06-08 DIAGNOSIS — R066 Hiccough: Secondary | ICD-10-CM

## 2015-06-08 DIAGNOSIS — N185 Chronic kidney disease, stage 5: Secondary | ICD-10-CM

## 2015-06-08 HISTORY — PX: AV FISTULA PLACEMENT: SHX1204

## 2015-06-08 LAB — CBC
HCT: 25.2 % — ABNORMAL LOW (ref 39.0–52.0)
HCT: 25.8 % — ABNORMAL LOW (ref 39.0–52.0)
HEMOGLOBIN: 7.8 g/dL — AB (ref 13.0–17.0)
Hemoglobin: 8.1 g/dL — ABNORMAL LOW (ref 13.0–17.0)
MCH: 25.7 pg — AB (ref 26.0–34.0)
MCH: 26.1 pg (ref 26.0–34.0)
MCHC: 31 g/dL (ref 30.0–36.0)
MCHC: 31.4 g/dL (ref 30.0–36.0)
MCV: 83.2 fL (ref 78.0–100.0)
MCV: 83.2 fL (ref 78.0–100.0)
PLATELETS: 97 10*3/uL — AB (ref 150–400)
PLATELETS: 90 10*3/uL — AB (ref 150–400)
RBC: 3.03 MIL/uL — AB (ref 4.22–5.81)
RBC: 3.1 MIL/uL — ABNORMAL LOW (ref 4.22–5.81)
RDW: 16.3 % — ABNORMAL HIGH (ref 11.5–15.5)
RDW: 16.1 % — AB (ref 11.5–15.5)
WBC: 3.7 10*3/uL — AB (ref 4.0–10.5)
WBC: 3.7 10*3/uL — AB (ref 4.0–10.5)

## 2015-06-08 LAB — BASIC METABOLIC PANEL
Anion gap: 12 (ref 5–15)
BUN: 16 mg/dL (ref 6–20)
CALCIUM: 8.4 mg/dL — AB (ref 8.9–10.3)
CHLORIDE: 98 mmol/L — AB (ref 101–111)
CO2: 28 mmol/L (ref 22–32)
CREATININE: 4.2 mg/dL — AB (ref 0.61–1.24)
GFR calc Af Amer: 15 mL/min — ABNORMAL LOW (ref >60)
GFR calc non Af Amer: 13 mL/min — ABNORMAL LOW (ref >60)
Glucose, Bld: 199 mg/dL — ABNORMAL HIGH (ref 65–99)
Potassium: 3.8 mmol/L (ref 3.5–5.1)
SODIUM: 138 mmol/L (ref 135–145)

## 2015-06-08 LAB — GLUCOSE, CAPILLARY
GLUCOSE-CAPILLARY: 103 mg/dL — AB (ref 65–99)
GLUCOSE-CAPILLARY: 196 mg/dL — AB (ref 65–99)
GLUCOSE-CAPILLARY: 144 mg/dL — AB (ref 65–99)
GLUCOSE-CAPILLARY: 100 mg/dL — AB (ref 65–99)
Glucose-Capillary: 130 mg/dL — ABNORMAL HIGH (ref 65–99)
Glucose-Capillary: 217 mg/dL — ABNORMAL HIGH (ref 65–99)

## 2015-06-08 LAB — SURGICAL PCR SCREEN
MRSA, PCR: NEGATIVE
Staphylococcus aureus: NEGATIVE

## 2015-06-08 SURGERY — ARTERIOVENOUS (AV) FISTULA CREATION
Anesthesia: LOCAL | Site: Arm Upper | Laterality: Left

## 2015-06-08 MED ORDER — SODIUM CHLORIDE 0.9 % IV SOLN
INTRAVENOUS | Status: DC | PRN
  Administered 2015-06-08: 14:00:00

## 2015-06-08 MED ORDER — SODIUM CHLORIDE 0.9 % IV SOLN
INTRAVENOUS | Status: DC
  Administered 2015-06-08: 25 mL/h via INTRAVENOUS
  Administered 2015-06-09: 06:00:00 via INTRAVENOUS

## 2015-06-08 MED ORDER — FENTANYL CITRATE (PF) 100 MCG/2ML IJ SOLN: 25.0000 ug | INTRAMUSCULAR | Status: DC | PRN

## 2015-06-08 MED ORDER — 0.9 % SODIUM CHLORIDE (POUR BTL) OPTIME
TOPICAL | Status: DC | PRN
  Administered 2015-06-08: 1000 mL

## 2015-06-08 MED ORDER — HEPARIN (PORCINE) IN NACL 100-0.45 UNIT/ML-% IJ SOLN
800.0000 [IU]/h | INTRAMUSCULAR | Status: DC
  Administered 2015-06-09: 800 [IU]/h via INTRAVENOUS
  Filled 2015-06-08 (×2): qty 250

## 2015-06-08 MED ORDER — SODIUM CHLORIDE 0.9 % IV SOLN
INTRAVENOUS | Status: DC | PRN
  Administered 2015-06-08: 14:00:00 via INTRAVENOUS

## 2015-06-08 MED ORDER — WARFARIN SODIUM 6 MG PO TABS
6.0000 mg | ORAL_TABLET | Freq: Once | ORAL | Status: AC
  Administered 2015-06-09: 6 mg via ORAL
  Filled 2015-06-08: qty 1

## 2015-06-08 MED ORDER — LIDOCAINE-EPINEPHRINE (PF) 1 %-1:200000 IJ SOLN
INTRAMUSCULAR | Status: DC | PRN
  Administered 2015-06-08: 30 mL

## 2015-06-08 MED ORDER — LORAZEPAM 0.5 MG PO TABS
0.5000 mg | ORAL_TABLET | Freq: Two times a day (BID) | ORAL | Status: DC | PRN
  Administered 2015-06-08 – 2015-06-09 (×2): 0.5 mg via ORAL
  Filled 2015-06-08 (×2): qty 1

## 2015-06-08 MED ORDER — LIDOCAINE-EPINEPHRINE (PF) 1 %-1:200000 IJ SOLN
INTRAMUSCULAR | Status: AC
  Filled 2015-06-08: qty 30

## 2015-06-08 MED ORDER — WARFARIN - PHARMACIST DOSING INPATIENT: Freq: Every day | Status: DC

## 2015-06-08 MED ORDER — HYDROCODONE-ACETAMINOPHEN 7.5-325 MG PO TABS: 1.0000 | ORAL_TABLET | Freq: Once | ORAL | Status: DC | PRN

## 2015-06-08 SURGICAL SUPPLY — 31 items
ARMBAND PINK RESTRICT EXTREMIT (MISCELLANEOUS) ×2 IMPLANT
CANISTER SUCTION 2500CC (MISCELLANEOUS) ×2 IMPLANT
CLIP TI MEDIUM 6 (CLIP) ×2 IMPLANT
CLIP TI WIDE RED SMALL 6 (CLIP) ×2 IMPLANT
COVER PROBE W GEL 5X96 (DRAPES) ×2 IMPLANT
DRAIN PENROSE 1/2X12 LTX STRL (WOUND CARE) ×2 IMPLANT
ELECT REM PT RETURN 9FT ADLT (ELECTROSURGICAL) ×2
ELECTRODE REM PT RTRN 9FT ADLT (ELECTROSURGICAL) ×1 IMPLANT
GLOVE BIO SURGEON STRL SZ 6.5 (GLOVE) ×2 IMPLANT
GLOVE BIOGEL PI IND STRL 6.5 (GLOVE) ×2 IMPLANT
GLOVE BIOGEL PI IND STRL 7.0 (GLOVE) ×1 IMPLANT
GLOVE BIOGEL PI IND STRL 7.5 (GLOVE) ×1 IMPLANT
GLOVE BIOGEL PI INDICATOR 6.5 (GLOVE) ×2
GLOVE BIOGEL PI INDICATOR 7.0 (GLOVE) ×1
GLOVE BIOGEL PI INDICATOR 7.5 (GLOVE) ×1
GLOVE ECLIPSE 7.0 STRL STRAW (GLOVE) ×2 IMPLANT
GLOVE SS BIOGEL STRL SZ 7 (GLOVE) ×1 IMPLANT
GLOVE SUPERSENSE BIOGEL SZ 7 (GLOVE) ×1
GOWN STRL REUS W/ TWL LRG LVL3 (GOWN DISPOSABLE) ×3 IMPLANT
GOWN STRL REUS W/TWL LRG LVL3 (GOWN DISPOSABLE) ×3
KIT BASIN OR (CUSTOM PROCEDURE TRAY) ×2 IMPLANT
KIT ROOM TURNOVER OR (KITS) ×2 IMPLANT
LIQUID BAND (GAUZE/BANDAGES/DRESSINGS) ×2 IMPLANT
NS IRRIG 1000ML POUR BTL (IV SOLUTION) ×2 IMPLANT
PACK CV ACCESS (CUSTOM PROCEDURE TRAY) ×2 IMPLANT
PAD ARMBOARD 7.5X6 YLW CONV (MISCELLANEOUS) ×4 IMPLANT
SUT PROLENE 6 0 BV (SUTURE) ×2 IMPLANT
SUT VIC AB 3-0 SH 27 (SUTURE) ×1
SUT VIC AB 3-0 SH 27X BRD (SUTURE) ×1 IMPLANT
UNDERPAD 30X30 INCONTINENT (UNDERPADS AND DIAPERS) ×2 IMPLANT
WATER STERILE IRR 1000ML POUR (IV SOLUTION) ×2 IMPLANT

## 2015-06-08 NOTE — Care Management Important Message (Signed)
Important Message  Patient Details  Name: Eddie Thomas MRN: [id number] Date of Birth: Nov 30, 1945   Medicare Important Message Given:  Yes    Prisma Decarlo, Rory Percy, RN 06/08/2015, 3:10 PM

## 2015-06-08 NOTE — Anesthesia Preprocedure Evaluation (Addendum)
Anesthesia Evaluation  Patient identified by MRN, date of birth, ID band Patient awake    Reviewed: Allergy & Precautions, NPO status , Patient's Chart, lab work & pertinent test results  Airway Mallampati: II  TM Distance: >3 FB Neck ROM: Full    Dental  (+) Dental Advisory Given, Edentulous Upper   Pulmonary neg pulmonary ROS,    breath sounds clear to auscultation       Cardiovascular hypertension, Pt. on medications  Rhythm:Regular Rate:Normal     Neuro/Psych negative neurological ROS     GI/Hepatic Neg liver ROS, PUD,   Endo/Other  diabetes, Type 2, Insulin Dependent  Renal/GU ESRFRenal disease     Musculoskeletal   Abdominal   Peds  Hematology  (+) anemia , Multiple myeloma   Anesthesia Other Findings   Reproductive/Obstetrics                           Lab Results  Component Value Date   WBC 3.7* 06/08/2015   HGB 8.1* 06/08/2015   HCT 25.8* 06/08/2015   MCV 83.2 06/08/2015   PLT 90* 06/08/2015   Lab Results  Component Value Date   CREATININE 4.20* 06/08/2015   BUN 16 06/08/2015   NA 138 06/08/2015   K 3.8 06/08/2015   CL 98* 06/08/2015   CO2 28 06/08/2015    Anesthesia Physical Anesthesia Plan  ASA: III  Anesthesia Plan:    Post-op Pain Management:    Induction:   Airway Management Planned: Natural Airway and Simple Face Mask  Additional Equipment:   Intra-op Plan:   Post-operative Plan:   Informed Consent: I have reviewed the patients History and Physical, chart, labs and discussed the procedure including the risks, benefits and alternatives for the proposed anesthesia with the patient or authorized representative who has indicated his/her understanding and acceptance.     Plan Discussed with: CRNA and Surgeon  Anesthesia Plan Comments: (Local only.)       Anesthesia Quick Evaluation

## 2015-06-08 NOTE — Anesthesia Procedure Notes (Signed)
Procedure Name: MAC Date/Time: 06/08/2015 2:05 PM Performed by: Jenne Campus Pre-anesthesia Checklist: Patient identified, Emergency Drugs available, Suction available, Patient being monitored and Timeout performed Patient Re-evaluated:Patient Re-evaluated prior to induction

## 2015-06-08 NOTE — Progress Notes (Signed)
Nutrition Consult  Received consult for renal diet education. Patient was not very willing to discuss renal diet with RD. Per patient's request, RD left Renal Choose-a-meal booklet with patient for his review. Briefly discussed the implications of the diet. Encouraged patient to see RD at his dialysis center for further diet education and questions. No further nutrition needs at this time.  Molli Barrows, RD, LDN, [place] Pager [phone] After Hours Pager [phone]

## 2015-06-08 NOTE — Anesthesia Postprocedure Evaluation (Signed)
Anesthesia Post Note  Patient: Eddie Thomas August  Procedure(s) Performed: Procedure(s) (LRB): LEFT UPPER ARM ARTERIOVENOUS (AV) FISTULA CREATION (Left)  Patient location during evaluation: PACU Anesthesia Type: MAC Level of consciousness: awake and alert Pain management: pain level controlled Vital Signs Assessment: post-procedure vital signs reviewed and stable Respiratory status: spontaneous breathing, nonlabored ventilation, respiratory function stable and patient connected to nasal cannula oxygen Cardiovascular status: stable and blood pressure returned to baseline Anesthetic complications: no    Last Vitals:  Filed Vitals:   06/08/15 1510 06/08/15 1630  BP:    Pulse:    Temp: 37.3 C 37.5 C  Resp:      Last Pain:  Filed Vitals:   06/08/15 1645  PainSc: 0-No pain                 [name]

## 2015-06-08 NOTE — Progress Notes (Signed)
PROGRESS NOTE    Eddie Thomas  JOI:[id number] DOB: [birth year] DOA: 06/01/2015 PCP: [name]   Outpatient Specialists: Oncology dr Julien Nordmann.   Brief Narrative: Eddie Thomas is a 70 y.o. male with medical history significant of multiple myeloma, IgA subtype, currently undergoing treatment with Velcade/Daratumumab at the cancer center, follows Dr. Julien Nordmann of medical oncology, also having a history of chronic kidney disease (with baseline creatinine near 1.6-2.0), hypertension, diabetes mellitus, presented as a transfer from the cancer center for acute renal failure with creatinine of 10. Renal consulted and initiated hemodialysis.  Assessment & Plan:   Principal Problem:   Acute on chronic renal failure (HCC) Active Problems:   Multiple myeloma (HCC)   Hypertension   Bone marrow transplant complication (HCC)   Dehydration   Hypoalbuminemia due to protein-calorie malnutrition (HCC)   Antineoplastic chemotherapy induced pancytopenia ([place])   Long term current use of anticoagulant therapy   Acute renal failure (ARF) (HCC)   Acute renal failure complicating chronic kidney disease: - Nephrology was consulted. As per nephrology, creatinine started to rise in March-April 2017 up to 1.4-1.8 range with some bumps up to 2.3-2.5. On May 8, creatinine was 3.3. Now admitted with creatinine of 10. 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 Patient's myeloma is refractory to multiple different chemotherapy combinations and do not believe that his chemotherapy or pheresis is going to help his renal function. - HD catheter placed and patient was started on hemodialysis 5/16. - Discussed with Dr. Jonnie Finner on 5/17: Prognosis for dialysis in myeloma patients is not great and he suggests considering palliative care input- discussed with Dr. Julien Nordmann, primary oncologist who will pursue palliative care consultation as outpatient as deemed necessary. - VVS being consulted for PERM access >possible AV  fistula by vascular surgery on 5/19. Patient underwent HD 5/16-5/18 and then will get on 5/20  Multiple Myeloma: - Follows with Dr Julien Nordmann.   Anemia - due to multiple myeloma, transfused 2 units PRBC, monitor hemoglobin closely hemoglobin in the 7.8 range for last 3 days and has dropped to 7 on 5/17. Patient had some bleeding from HD catheter overnight 5/16. This has stopped. Follow CBC in a.m. and consider transfusing if hemoglobin less than 7 g per DL. - Hemoglobin better at 8.1. Stable  Pancytopenia - Secondary to multiple myeloma and related cancer chemotherapy. Platelets stable in the 90s. WBC stable in the 2 range. Anemia management as above.  Hyperkalemia - Resolved after dialysis.  NSVT: - Patient had 15 beats of nonsustained V. tach, hypomagnesemia corrected, 2-D echo done 11/2015 with EF 55-60% - Continue with telemetry monitor, would consider low-dose beta blocker if recurrence - May be related to renal failure, electrolyte abnormalities. Repeat 2-D echo: Normal EF.  History of DVT - Xarelto DC'ed given renal function - Discussed with Dr. Julien Nordmann on 5/17 and he recommended continued anticoagulation. Patient had been started on IV heparin which was held on 5/18 a.m. due to mild bleeding from dialysis catheter site. Discussed with IR who recommend holding off on IV heparin/anticoagulation until 5/19. Also scheduled for AV fistula placement tomorrow. Consider starting IV heparin bridging and Coumadin post-AV fistula: Vascular surgery to advise timing to safely start. SCDs interim.  Essential hypertension - ACEI stopped due to acute renal failure.  Intractable hiccups - Discussed with nephrology: Trial of Ativan when necessary    DVT prophylaxis: SCDs Code Status: Full Family Communication: Discussed with patient. None at bedside today. Disposition Plan: DC home when medically stable, may not be until early  next week.   Consultants:    Renal.  Oncology  IR  Vascular surgery  Procedures:   Right IJ HD catheter insertion by interventional radiology on 5/16  Hemodialysis initiated 5/16  2-D echo 06/08/15:   Study Conclusions  - Left ventricle: The cavity size was normal. Wall thickness was  normal. Systolic function was normal. The estimated ejection  fraction was in the range of 55% to 65%. Features are consistent  with a pseudonormal left ventricular filling pattern, with  concomitant abnormal relaxation and increased filling pressure  (grade 2 diastolic dysfunction). - Mitral valve: There was moderate regurgitation. - Left atrium: The atrium was severely dilated. - Tricuspid valve: There was moderate regurgitation. - Pulmonary arteries: Systolic pressure was moderately increased.  PA peak pressure: 49 mm Hg (S).  Antimicrobials:  None  Subjective: Intractable hiccups for the last couple of days. Had one episode of mild nonbloody emesis overnight. Chest discomfort associated with hiccups.  Objective: Filed Vitals:   06/08/15 0604 06/08/15 0922 06/08/15 1238 06/08/15 1510  BP: 149/75 134/81 135/64   Pulse: 89 90 92   Temp: 100.7 F (38.2 C) 98.4 F (36.9 C) 98.4 F (36.9 C) 99.1 F (37.3 C)  TempSrc: Oral Oral Oral   Resp: _0 Height:      Weight:      SpO2: 97% 98% 98%     Intake/Output Summary (Last 24 hours) at 06/08/15 1547 Last data filed at 06/08/15 0924  Gross per 24 hour  Intake    850 ml  Output      0 ml  Net    850 ml   Filed Weights   06/06/15 1247 06/07/15 1042 06/07/15 1425  Weight: 76.8 kg (169 lb 5 oz) 73 kg (160 lb 15 oz) 70 kg (154 lb 5.2 oz)    Examination:  General exam: Pleasant middle-aged male sitting up comfortably in chair this morning Respiratory system: Clear to auscultation. Respiratory effort normal. Left upper chest Port-A-Cath. Right upper chest IJ HD catheter site dressing clean and dry. Cardiovascular system: S1 & S2 heard, RRR. No  JVD, murmurs, rubs, gallops or clicks. No pedal edema. Telemetry: Sinus rhythm.  Gastrointestinal system: Abdomen is nondistended, soft and nontender. No organomegaly or masses felt. Normal bowel sounds heard. Central nervous system: Alert and oriented. No focal neurological deficits. Extremities: Symmetric 5 x 5 power. Skin: No rashes, lesions or ulcers Psychiatry: Judgement and insight appear normal. Mood & affect appropriate.     Data Reviewed: I have personally reviewed following labs and imaging studies  CBC:  Recent Labs Lab 06/05/15 1841 06/06/15 0605 06/07/15 1050 06/08/15 0410 06/08/15 1209  WBC 2.2* 2.1* 2.3* 3.7* 3.7*  HGB 7.8* 7.0* 7.6* 7.8* 8.1*  HCT 22.6* 21.1* 23.6* 25.2* 25.8*  MCV 79.9 79.6 80.5 83.2 83.2  PLT 90* 92* 96* 97* 90*   Basic Metabolic Panel:  Recent Labs Lab 06/02/15 1130 06/03/15 0935 06/04/15 0600 06/05/15 1841 06/06/15 0605 06/07/15 1050 06/08/15 1209  NA  --  136 138 141 141 141 138  K  --  4.2 5.2* 4.0 3.5 3.4* 3.8  CL  --  107 110 111 102 102 98*  CO2  --  16* 18* 17* _1 GLUCOSE  --  145* 138* 97 88 118* 199*  BUN  --  65* 81* 71* 32* 19 16  CREATININE  --  9.48* 9.44* 9.36* 5.49* 4.53* 4.20*  CALCIUM  --  7.1* 7.1* 7.3* 7.6*  8.0* 8.4*  MG 1.2* 1.9  --   --   --   --   --   PHOS  --   --   --   --   --  2.9  --    GFR: Estimated Creatinine Clearance: 16.4 mL/min (by C-G formula based on Cr of 4.2). Liver Function Tests:  Recent Labs Lab 06/07/15 1050  ALBUMIN 2.3*   No results for input(s): LIPASE, AMYLASE in the last 168 hours. No results for input(s): AMMONIA in the last 168 hours. Coagulation Profile: No results for input(s): INR, PROTIME in the last 168 hours. Cardiac Enzymes: No results for input(s): CKTOTAL, CKMB, CKMBINDEX, TROPONINI in the last 168 hours. BNP (last 3 results) No results for input(s): PROBNP in the last 8760 hours. HbA1C: No results for input(s): HGBA1C in the last 72  hours. CBG:  Recent Labs Lab 06/07/15 2150 06/08/15 0801 06/08/15 1134 06/08/15 1329 06/08/15 1512  GLUCAP 133* 103* 196* 144* 130*   Lipid Profile: No results for input(s): CHOL, HDL, LDLCALC, TRIG, CHOLHDL, LDLDIRECT in the last 72 hours. Thyroid Function Tests: No results for input(s): TSH, T4TOTAL, FREET4, T3FREE, THYROIDAB in the last 72 hours. Anemia Panel: No results for input(s): VITAMINB12, FOLATE, FERRITIN, TIBC, IRON, RETICCTPCT in the last 72 hours. Urine analysis:    Component Value Date/Time   COLORURINE YELLOW 06/01/2015 [place] 06/01/2015 1540   LABSPEC 1.008 06/01/2015 1540   PHURINE 6.0 06/01/2015 1540   GLUCOSEU NEGATIVE 06/01/2015 1540   HGBUR TRACE* 06/01/2015 1540   BILIRUBINUR NEGATIVE 06/01/2015 1540   KETONESUR NEGATIVE 06/01/2015 1540   PROTEINUR 30* 06/01/2015 1540   UROBILINOGEN 1.0 01/16/2012 1902   NITRITE NEGATIVE 06/01/2015 1540   LEUKOCYTESUR NEGATIVE 06/01/2015 1540   Sepsis Labs: No results for input(s): PROCALCITON, LATICACIDVEN in the last 168 hours.  Recent Results (from the past 240 hour(s))  Surgical PCR screen     Status: None   Collection Time: 06/08/15  9:58 AM  Result Value Ref Range Status   MRSA, PCR NEGATIVE NEGATIVE Final   Staphylococcus aureus NEGATIVE NEGATIVE Final    Comment:        The Xpert SA Assay (FDA approved for NASAL specimens in patients over 66 years of age), is one component of a comprehensive surveillance program.  Test performance has been validated by [facility] for patients greater than or equal to 58 year old. It is not intended to diagnose infection nor to guide or monitor treatment.          Radiology Studies: No results found.      Scheduled Meds: . [MAR Hold] feeding supplement (ENSURE ENLIVE)  237 mL Oral BID BM  . [MAR Hold] insulin aspart  0-15 Units Subcutaneous TID WC  . [MAR Hold] insulin aspart  0-5 Units Subcutaneous QHS  . [MAR Hold] isosorbide  mononitrate  30 mg Oral Daily  . [MAR Hold] sodium chloride flush  3 mL Intravenous Q12H  . [MAR Hold] THROMBI-PAD  1 each Topical Once   Continuous Infusions: . sodium chloride 25 mL/hr (06/08/15 1329)     LOS: 7 days    Time spent: 25 minutes.     [facility], MD [facility] Pager [phone]  If 7PM-7AM, please contact night-coverage [email or web address] Password [facility] 06/08/2015, 3:47 PM

## 2015-06-08 NOTE — Op Note (Signed)
OPERATIVE REPORT  Date of Surgery: 06/01/2015 - 06/08/2015  Surgeon: Tinnie Gens, MD  Assistant: Gerri Lins PA  Pre-op Diagnosis: End Stage Renal Disease N18.6  Post-op Diagnosis: End Stage Renal Disease N18.6  Procedure: Procedure(s): LEFT UPPER ARM ARTERIOVENOUS (AV) FISTULA CREATION-brachial-cephalic  Anesthesia: Straight local anesthesia-no IV sedation  EBL: Minimal  Complications: None  Procedure Details: Patient taking the operative placed in supine position at which time left upper extremity was prepped Betadine scrub and solution draped in routine sterile manner. The cephalic vein was then imaged using B mode ultrasound-sono site felt to be adequate in the upper arm for fistula creation consistent with preoperative vein mapping. After infiltration forms and Xylocaine with epinephrine transverse incision was made in the antecubital area. Cephalic vein was dissected free branches ligated with 3-0 silk ties and divided it was ligated distally transected gently dilated with heparinized saline and marked for orientation purposes. It appeared adequate being about 3-1/2 mm in size. Brachial artery was an excellent large caliber vessel with some circled with Vesseloops. Had a good pulse. It was occluded proximally and distally open 15 blade extended with Potts scissors. Vein was carefully measured spatulated and anastomosed end to side with 6-0 Prolene. Vesseloops and then released there was excellent pulse and palpable thrill in the fistula up to the deltopectoral groove. There was also a strongly palpable radial pulse distally with the fistula open. Adequate hemostasis was achieved wound closed in layers with Vicryl in subcuticular fashion with Dermabond patient taken to recovery room in satisfactory condition   Tinnie Gens, MD 06/08/2015 3:05 PM

## 2015-06-08 NOTE — Progress Notes (Signed)
  [facility] Progress Note   Subjective: Up in chair, no complaint. Still poor appetite.    Filed Vitals:   06/07/15 1425 06/07/15 2012 06/08/15 0604 06/08/15 0922  BP: 141/80 146/71 149/75 134/81  Pulse: 72 78 89 90  Temp: 98.2 F (36.8 C) 99.7 F (37.6 C) 100.7 F (38.2 C) 98.4 F (36.9 C)  TempSrc: Oral Oral Oral Oral  Resp: '17 18 18 18  '$ Height:      Weight: 70 kg (154 lb 5.2 oz)     SpO2:  99% 97% 98%    Inpatient medications: . cefUROXime (ZINACEF)  IV  1.5 g Intravenous To SS-Surg  . feeding supplement (ENSURE ENLIVE)  237 mL Oral BID BM  . insulin aspart  0-15 Units Subcutaneous TID WC  . insulin aspart  0-5 Units Subcutaneous QHS  . isosorbide mononitrate  30 mg Oral Daily  . sodium chloride flush  3 mL Intravenous Q12H  . THROMBI-PAD  1 each Topical Once     acetaminophen **OR** acetaminophen, morphine injection, ondansetron **OR** ondansetron (ZOFRAN) IV, oxyCODONE, sodium chloride flush  Exam: General: Alert, wdwn, no distress Neck: no jvd Lungs: clear bilat Heart: RRR no mrg Abdomen: Soft, ntnd +bs Extremities:1- 2+ LE edema  UA negative Renal US > 11-13 cm kidneys, no hydro, ^'d echo     Assessment:  1. Acute/ subacute renal failure - in setting of advanced myeloma sp multiple chemo combinations and SCT in 2012.  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 VVS seeing for perm access and CLIP pending. Ready for dc once perm access and CLIP in place.   2. BP - no meds, bp's ok 3. Vol^ improving 4. Anemia - severe, from myeloma + CKD.  With history of cancer and DVT, risks of ESA outweighs benefit. Transfuse prn.   5. Multiple myeloma - refractory to mult regimens and sp SCT 2012   Plan - HD Sat, perm access per VVS   Kelly Splinter MD [place] pager [phone]    cell [phone] 06/08/2015, 11:52 AM    Recent Labs Lab 06/05/15 1841 06/06/15 0605 06/07/15 1050  NA 141  141 141  K 4.0 3.5 3.4*  CL 111 102 102  CO2 17* 24 29  GLUCOSE 97 88 118*  BUN 71* 32* 19  CREATININE 9.36* 5.49* 4.53*  CALCIUM 7.3* 7.6* 8.0*  PHOS  --   --  2.9    Recent Labs Lab 06/07/15 1050  ALBUMIN 2.3*    Recent Labs Lab 06/06/15 0605 06/07/15 1050 06/08/15 0410  WBC 2.1* 2.3* 3.7*  HGB 7.0* 7.6* 7.8*  HCT 21.1* 23.6* 25.2*  MCV 79.6 80.5 83.2  PLT 92* 96* 97*

## 2015-06-08 NOTE — Transfer of Care (Signed)
Immediate Anesthesia Transfer of Care Note  Patient: Eddie Thomas  Procedure(s) Performed: Procedure(s): LEFT UPPER ARM ARTERIOVENOUS (AV) FISTULA CREATION (Left)  Patient Location: PACU  Anesthesia Type:MAC  Level of Consciousness: awake, alert , oriented and patient cooperative  Airway & Oxygen Therapy: Patient connected to nasal cannula oxygen  Post-op Assessment: Report given to RN and Post -op Vital signs reviewed and stable  Post vital signs: Reviewed  Last Vitals:  Filed Vitals:   06/08/15 0922 06/08/15 1238  BP: 134/81 135/64  Pulse: 90 92  Temp: 36.9 C 36.9 C  Resp: 18 18    Last Pain:  Filed Vitals:   06/08/15 1240  PainSc: 0-No pain         Complications: No apparent anesthesia complications

## 2015-06-08 NOTE — Progress Notes (Signed)
ANTICOAGULATION CONSULT NOTE - Follow Up Consult  Pharmacy Consult for heparin and warfarin Indication: Hx of DVT (rivaroxaban held for renal failure)  No Known Allergies  Patient Measurements: Height: 5\' 11"  (180.3 cm) Weight: 154 lb 5.2 oz (70 kg) IBW/kg (Calculated) : 75.3  Vital Signs: Temp: 98.1 F (36.7 C) (05/19 1700) Temp Source: Oral (05/19 1700) BP: 151/70 mmHg (05/19 1700) Pulse Rate: 86 (05/19 1700)  Labs:  Recent Labs  06/06/15 0605 06/06/15 2341 06/07/15 1050 06/08/15 0410 06/08/15 1209  HGB 7.0*  --  7.6* 7.8* 8.1*  HCT 21.1*  --  23.6* 25.2* 25.8*  PLT 92*  --  96* 97* 90*  HEPARINUNFRC 0.15* 0.15*  --   --   --   CREATININE 5.49*  --  4.53*  --  4.20*    Estimated Creatinine Clearance: 16.4 mL/min (by C-G formula based on Cr of 4.2).   Medications:  Infusions:  . sodium chloride 25 mL/hr (06/08/15 1329)    Assessment: 48 yoM admitted on 5/12 with acute on CKD (baseline SCr 1.6). PMH includes MM on Velcade/daratumumab/dexamethasone chemotherapy regimen, DVT (dx 06/2014) on chronic Xarelto anticoagulation. Xarelto is held for renal failure and pharmacy consulted to dose heparin. Plan to transition to po warfarin once procedures complete. Noted that heparin drip was held 5/17 for bleeding from pt's HD cath placed 5/16 and this has resolved. LUE AV fistula placed today. Spoke with Dr. Algis Liming who confirmed with VVS to resume heparin drip tomorrow morning; also start warfarin tomorrow.  Goal of Therapy:  INR 2-3 Heparin level 0.3-0.7 units/ml (attempt to keep on lower end) Monitor platelets by anticoagulation protocol: Yes   Plan: Resume heparin drip at 800 units/hr with no bolus on 5/20 at 06:00 8 hr HL Daily HL, INR and CBC Warfarin 6 mg PO x1 on 5/20 Monitor for s/sx of bleeding   [facility], [place].D., BCPS Clinical Pharmacist Pager: [phone] 06/08/2015 7:30 PM

## 2015-06-08 NOTE — H&P (View-Only) (Signed)
Patient ID: Eddie Thomas, male   DOB: Jun 06, 1945, 70 y.o.   MRN: [id number] Vascular Surgery Progress Note  Subjective: End-stage renal disease-needs vascular access  Objective:  Filed Vitals:   06/06/15 2014 06/07/15 0506  BP: 133/60 157/75  Pulse: 83 81  Temp: 97.7 F (36.5 C) 98.9 F (37.2 C)  Resp: 20 20    No change from previous exam  No edema and left upper extremity and 3+ pulses palpable   Labs:  Recent Labs Lab 06/04/15 0600 06/05/15 1841 06/06/15 0605  CREATININE 9.44* 9.36* 5.49*    Recent Labs Lab 06/04/15 0600 06/05/15 1841 06/06/15 0605  NA 138 141 141  K 5.2* 4.0 3.5  CL 110 111 102  CO2 18* 17* 24  BUN 81* 71* 32*  CREATININE 9.44* 9.36* 5.49*  GLUCOSE 138* 97 88  CALCIUM 7.1* 7.3* 7.6*    Recent Labs Lab 06/04/15 0600 06/05/15 1841 06/06/15 0605  WBC 2.9* 2.2* 2.1*  HGB 7.8* 7.8* 7.0*  HCT 22.7* 22.6* 21.1*  PLT 92* 90* 92*   No results for input(s): INR in the last 168 hours.  I/O last 3 completed shifts: In: 1481.9 [P.O.:300; I.V.:1181.9] Out: 2500 [Other:2500]  Imaging: Ir Fluoro Guide Cv Line Right  06/05/2015  INDICATION: Acute kidney injury, multiple myeloma, renal failure EXAM: ULTRASOUND GUIDANCE FOR VASCULAR ACCESS RIGHT INTERNAL JUGULAR PERMANENT HEMODIALYSIS CATHETER Date:  5/16/20175/16/2017 3:53 pm Radiologist:  M. Daryll Brod, MD Guidance:  Ultrasound and fluoroscopic FLUOROSCOPY TIME:  Fluoroscopy Time:  30 seconds (2 mGy). MEDICATIONS: 1% lidocaine locally, 2 g Ancef administered within 1 hour of the procedure ANESTHESIA/SEDATION: Versed 0.5 mg IV; Fentanyl 25 mcg IV; Moderate Sedation Time:  15 minutes The patient was continuously monitored during the procedure by the interventional radiology nurse under my direct supervision. CONTRAST:  None COMPLICATIONS: None immediate. PROCEDURE: Informed consent was obtained from the patient following explanation of the procedure, risks, benefits and alternatives. The patient  understands, agrees and consents for the procedure. All questions were addressed. A time out was performed. Maximal barrier sterile technique utilized including caps, mask, sterile gowns, sterile gloves, large sterile drape, hand hygiene, and 2% chlorhexidine scrub. Under sterile conditions and local anesthesia, right internal jugular micropuncture venous access was performed with ultrasound. Images were obtained for documentation of the patent right internal jugular vein. A guide wire was inserted followed by a transitional dilator. Next, a 0.035 guidewire was advanced into the IVC with a 5-French catheter. Measurements were obtained from the right venotomy site to the proximal right atrium. In the right infraclavicular chest, a subcutaneous tunnel was created under sterile conditions and local anesthesia. 1% lidocaine with epinephrine was utilized for this. The 19 cm tip to cuff palindrome catheter was tunneled subcutaneously to the venotomy site and inserted into the SVC/RA junction through a valved peel-away sheath. Position was confirmed with fluoroscopy. Images were obtained for documentation. Blood was aspirated from the catheter followed by saline and heparin flushes. The appropriate volume and strength of heparin was instilled in each lumen. Caps were applied. The catheter was secured at the tunnel site with Gelfoam and a pursestring suture. The venotomy site was closed with subcuticular Vicryl suture. Dermabond was applied to the small right neck incision. A dry sterile dressing was applied. The catheter is ready for use. No immediate complications. IMPRESSION: Ultrasound and fluoroscopically guided right internal jugular tunneled hemodialysis catheter (19 cm tip to cuff palindrome catheter). Electronically Signed   By: Jerilynn Mages.  Shick M.D.   On:  06/05/2015 16:34   Ir US Guide Vasc Access Right  06/05/2015  INDICATION: Acute kidney injury, multiple myeloma, renal failure EXAM: ULTRASOUND GUIDANCE FOR VASCULAR  ACCESS RIGHT INTERNAL JUGULAR PERMANENT HEMODIALYSIS CATHETER Date:  5/16/20175/16/2017 3:53 pm Radiologist:  M. Daryll Brod, MD Guidance:  Ultrasound and fluoroscopic FLUOROSCOPY TIME:  Fluoroscopy Time:  30 seconds (2 mGy). MEDICATIONS: 1% lidocaine locally, 2 g Ancef administered within 1 hour of the procedure ANESTHESIA/SEDATION: Versed 0.5 mg IV; Fentanyl 25 mcg IV; Moderate Sedation Time:  15 minutes The patient was continuously monitored during the procedure by the interventional radiology nurse under my direct supervision. CONTRAST:  None COMPLICATIONS: None immediate. PROCEDURE: Informed consent was obtained from the patient following explanation of the procedure, risks, benefits and alternatives. The patient understands, agrees and consents for the procedure. All questions were addressed. A time out was performed. Maximal barrier sterile technique utilized including caps, mask, sterile gowns, sterile gloves, large sterile drape, hand hygiene, and 2% chlorhexidine scrub. Under sterile conditions and local anesthesia, right internal jugular micropuncture venous access was performed with ultrasound. Images were obtained for documentation of the patent right internal jugular vein. A guide wire was inserted followed by a transitional dilator. Next, a 0.035 guidewire was advanced into the IVC with a 5-French catheter. Measurements were obtained from the right venotomy site to the proximal right atrium. In the right infraclavicular chest, a subcutaneous tunnel was created under sterile conditions and local anesthesia. 1% lidocaine with epinephrine was utilized for this. The 19 cm tip to cuff palindrome catheter was tunneled subcutaneously to the venotomy site and inserted into the SVC/RA junction through a valved peel-away sheath. Position was confirmed with fluoroscopy. Images were obtained for documentation. Blood was aspirated from the catheter followed by saline and heparin flushes. The appropriate volume  and strength of heparin was instilled in each lumen. Caps were applied. The catheter was secured at the tunnel site with Gelfoam and a pursestring suture. The venotomy site was closed with subcuticular Vicryl suture. Dermabond was applied to the small right neck incision. A dry sterile dressing was applied. The catheter is ready for use. No immediate complications. IMPRESSION: Ultrasound and fluoroscopically guided right internal jugular tunneled hemodialysis catheter (19 cm tip to cuff palindrome catheter). Electronically Signed   By: Jerilynn Mages.  Shick M.D.   On: 06/05/2015 16:34    Assessment/Plan:    LOS: 6 days  s/p   Await results of vein mapping today We will attempt to perform left arm AV fistula tomorrow but surgery schedule is quite full and this may not be able to be performed until first of the week We'll keep patient nothing by mouth performed tomorrow if possible   Tinnie Gens, MD 06/07/2015 7:29 AM

## 2015-06-08 NOTE — Interval H&P Note (Signed)
History and Physical Interval Note:  06/08/2015 1:41 PM  Eddie Thomas  has presented today for surgery, with the diagnosis of End Stage Renal Disease N18.6  The various methods of treatment have been discussed with the patient and family. After consideration of risks, benefits and other options for treatment, the patient has consented to  Procedure(s): ARTERIOVENOUS (AV) FISTULA CREATION (Left) as a surgical intervention .  The patient's history has been reviewed, patient examined, no change in status, stable for surgery.  I have reviewed the patient's chart and labs.  Questions were answered to the patient's satisfaction.     Tinnie Gens

## 2015-06-08 NOTE — Progress Notes (Signed)
06/08/2015 12:55 PM  Report called to OR holding.

## 2015-06-08 NOTE — Progress Notes (Signed)
Right  Upper Extremity Vein Map    Cephalic  Segment Diameter Depth Comment  1. Axilla 2.60mm 3.68mm   2. Mid upper arm 2.71mm 3.90mm   3. Above AC 3.40mm 4.43mm   4. In [facility] 3.57mm 2.95mm   5. Below AC 5.69mm 3.71mm Branch  6. Mid forearm 3.7mm 3.68mm Branch  7. Wrist 2.39mm 2.62mm                   Basilic Not evaluated   Left Upper Extremity Vein Map    Cephalic  Segment Diameter Depth Comment  1. Axilla 2.77mm 6.44mm   2. Mid upper arm 3.51mm 2.45mm   3. Above [facility] 2.64mm 2.21mm   4. In AC 3.21mm 1.87mm   5. Below AC 2.1mm 1.61mm   6. Mid forearm 2.36mm 1.41mm   7. Wrist 1.62mm 1.43mm                   Basilic  Segment Diameter Depth Comment  1. Axilla     2. Mid upper arm 3.64mm 25.110mm   3. Above [facility] 3.75mm 14.63mm   4. In [facility] 2.29mm 17.49mm   5. Below AC 2.65mm 3.70mm   6. Mid forearm 2.81mm 2.53mm   7. Wrist 1.9mm 1.48mm                    06/08/2015 11:45 AM Maudry Mayhew, RVT, RDCS, RDMS

## 2015-06-09 ENCOUNTER — Inpatient Hospital Stay (HOSPITAL_COMMUNITY): Payer: Medicare Other

## 2015-06-09 LAB — RENAL FUNCTION PANEL
ALBUMIN: 2.3 g/dL — AB (ref 3.5–5.0)
ANION GAP: 13 (ref 5–15)
BUN: 19 mg/dL (ref 6–20)
CALCIUM: 8.6 mg/dL — AB (ref 8.9–10.3)
CO2: 29 mmol/L (ref 22–32)
Chloride: 98 mmol/L — ABNORMAL LOW (ref 101–111)
Creatinine, Ser: 5.23 mg/dL — ABNORMAL HIGH (ref 0.61–1.24)
GFR calc Af Amer: 12 mL/min — ABNORMAL LOW (ref >60)
GFR calc non Af Amer: 10 mL/min — ABNORMAL LOW (ref >60)
GLUCOSE: 132 mg/dL — AB (ref 65–99)
PHOSPHORUS: 3.6 mg/dL (ref 2.5–4.6)
Potassium: 3.4 mmol/L — ABNORMAL LOW (ref 3.5–5.1)
SODIUM: 140 mmol/L (ref 135–145)

## 2015-06-09 LAB — CBC
HEMATOCRIT: 27.4 % — AB (ref 39.0–52.0)
HEMOGLOBIN: 8.7 g/dL — AB (ref 13.0–17.0)
MCH: 25.9 pg — ABNORMAL LOW (ref 26.0–34.0)
MCHC: 31.8 g/dL (ref 30.0–36.0)
MCV: 81.5 fL (ref 78.0–100.0)
Platelets: 71 10*3/uL — ABNORMAL LOW (ref 150–400)
RBC: 3.36 MIL/uL — AB (ref 4.22–5.81)
RDW: 16.3 % — AB (ref 11.5–15.5)
WBC: 4.4 10*3/uL (ref 4.0–10.5)

## 2015-06-09 LAB — GLUCOSE, CAPILLARY
GLUCOSE-CAPILLARY: 233 mg/dL — AB (ref 65–99)
Glucose-Capillary: 120 mg/dL — ABNORMAL HIGH (ref 65–99)
Glucose-Capillary: 222 mg/dL — ABNORMAL HIGH (ref 65–99)
Glucose-Capillary: 199 mg/dL — ABNORMAL HIGH (ref 65–99)

## 2015-06-09 LAB — PROTIME-INR
INR: 1.16 (ref 0.00–1.49)
Prothrombin Time: 15 seconds (ref 11.6–15.2)

## 2015-06-09 LAB — HEPARIN LEVEL (UNFRACTIONATED)
HEPARIN UNFRACTIONATED: 0.22 [IU]/mL — AB (ref 0.30–0.70)
Heparin Unfractionated: <0.1 IU/mL — ABNORMAL LOW (ref 0.30–0.70)

## 2015-06-09 MED ORDER — LORAZEPAM 0.5 MG PO TABS
0.5000 mg | ORAL_TABLET | Freq: Four times a day (QID) | ORAL | Status: DC | PRN
  Administered 2015-06-10 (×2): 0.5 mg via ORAL
  Filled 2015-06-09 (×2): qty 1

## 2015-06-09 MED ORDER — PROCHLORPERAZINE EDISYLATE 5 MG/ML IJ SOLN
5.0000 mg | Freq: Once | INTRAMUSCULAR | Status: AC
  Administered 2015-06-09: 5 mg via INTRAVENOUS
  Filled 2015-06-09: qty 2

## 2015-06-09 NOTE — Progress Notes (Signed)
   VASCULAR SURGERY ASSESSMENT & PLAN:   1 Day Post-Op s/p: Left brachiocephalic AV fistula. The fistula has an excellent thrill and he has a palpable left radial pulse.   Vascular surgery will be available as needed.  SUBJECTIVE: No complaints  PHYSICAL EXAM: Filed Vitals:   06/08/15 1640 06/08/15 1700 06/08/15 2101 06/09/15 0525  BP: 149/71 151/70 156/70 160/74  Pulse: 88 86 84 93  Temp:  98.1 F (36.7 C) 99 F (37.2 C) 98.5 F (36.9 C)  TempSrc:  Oral Oral Oral  Resp: '28 24 21 18  '$ Height:      Weight:   151 lb 11.2 oz (68.811 kg)   SpO2: 93% 95% 99% 100%   Incision looks fine Excellent thrill in left brachial cephalic AV fistula Palpable left radial pulse  LABS: Lab Results  Component Value Date   WBC 3.7* 06/08/2015   HGB 8.1* 06/08/2015   HCT 25.8* 06/08/2015   MCV 83.2 06/08/2015   PLT 90* 06/08/2015   Lab Results  Component Value Date   CREATININE 4.20* 06/08/2015   Lab Results  Component Value Date   INR 1.16 06/09/2015   CBG (last 3)   Recent Labs  06/08/15 1512 06/08/15 1701 06/08/15 2101  GLUCAP 130* 100* 217*    Principal Problem:   Acute on chronic renal failure (HCC) Active Problems:   Multiple myeloma (HCC)   Hypertension   Bone marrow transplant complication (HCC)   Dehydration   Hypoalbuminemia due to protein-calorie malnutrition (HCC)   Antineoplastic chemotherapy induced pancytopenia ([place])   Long term current use of anticoagulant therapy   Acute renal failure (ARF) ([place])    Gae Gallop Beeper: [phone] 06/09/2015

## 2015-06-09 NOTE — Progress Notes (Signed)
PROGRESS NOTE    Eddie Thomas  FOY:[id number] DOB: 04/03/1945 DOA: 06/01/2015 PCP: [name]   Outpatient Specialists: Oncology dr Julien Nordmann.   Brief Narrative: Eddie Thomas is a 70 y.o. male with medical history significant of multiple myeloma, IgA subtype, currently undergoing treatment with Velcade/Daratumumab at the cancer center, follows Dr. Julien Nordmann of medical oncology, also having a history of chronic kidney disease (with baseline creatinine near 1.6-2.0), hypertension, diabetes mellitus, presented as a transfer from the cancer center for acute renal failure with creatinine of 10. Renal consulted and initiated hemodialysis.  Assessment & Plan:   Principal Problem:   Acute on chronic renal failure (HCC) Active Problems:   Multiple myeloma (HCC)   Hypertension   Bone marrow transplant complication (HCC)   Dehydration   Hypoalbuminemia due to protein-calorie malnutrition (HCC)   Antineoplastic chemotherapy induced pancytopenia ([place])   Long term current use of anticoagulant therapy   Acute renal failure (ARF) (HCC)   Acute renal failure complicating chronic kidney disease: - Nephrology was consulted. As per nephrology, creatinine started to rise in March-April 2017 up to 1.4-1.8 range with some bumps up to 2.3-2.5. On May 8, creatinine was 3.3. Now admitted with creatinine of 10. 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 Patient's myeloma is refractory to multiple different chemotherapy combinations and do not believe that his chemotherapy or pheresis is going to help his renal function. - HD catheter placed and patient was started on hemodialysis 5/16. - Discussed with Dr. Jonnie Finner on 5/17: Prognosis for dialysis in myeloma patients is not great and he suggests considering palliative care input- discussed with Dr. Julien Nordmann, primary oncologist who will pursue palliative care consultation as outpatient as deemed necessary. - Patient underwent HD 5/16-5/18 and 5/20 - Status  post left brachiocephalic AV fistula placement by vascular surgery on 5/19.  Multiple Myeloma: - Follows with Dr Julien Nordmann.   Anemia - due to multiple myeloma, transfused 2 units PRBC this admission. - Stable. Monitor periodically.  Pancytopenia - Secondary to multiple myeloma and related cancer chemotherapy. - Stable  Hyperkalemia - Resolved after dialysis.  NSVT: - Patient had 15 beats of nonsustained V. tach, hypomagnesemia corrected, 2-D echo done 11/2015 with EF 55-60% - Continue with telemetry monitor, would consider low-dose beta blocker if recurrence - May be related to renal failure, electrolyte abnormalities. Repeat 2-D echo: Normal EF.  History of DVT - Xarelto DC'ed given renal function - Discussed with Dr. Julien Nordmann on 5/17 and he recommended continued anticoagulation.  - Discussed with Dr. Kellie Simmering on 5/19: Recommended starting IV heparin and Coumadin on 5/20 - INR has to be therapeutic prior to discharge. Patient aware.   Essential hypertension - ACEI stopped due to acute renal failure. Controlled.  Intractable hiccups - Discussed with nephrology: Trial of Ativan when necessary-helping    DVT prophylaxis: SCDs Code Status: Full Family Communication: Discussed with patient. None at bedside today. Disposition Plan: DC home when INR therapeutic.    Consultants:   Renal.  Oncology  IR  Vascular surgery  Procedures:   Right IJ HD catheter insertion by interventional radiology on 5/16  Hemodialysis initiated 5/16  2-D echo 06/08/15:   Study Conclusions  - Left ventricle: The cavity size was normal. Wall thickness was  normal. Systolic function was normal. The estimated ejection  fraction was in the range of 55% to 65%. Features are consistent  with a pseudonormal left ventricular filling pattern, with  concomitant abnormal relaxation and increased filling pressure  (grade 2 diastolic dysfunction). - Mitral valve:  There was moderate  regurgitation. - Left atrium: The atrium was severely dilated. - Tricuspid valve: There was moderate regurgitation. - Pulmonary arteries: Systolic pressure was moderately increased.  PA peak pressure: 49 mm Hg (S).  left brachiocephalic AV fistula placement by vascular surgery on 5/19  Antimicrobials:  None  Subjective: Intractable hiccups- better.   Objective: Filed Vitals:   06/09/15 1013 06/09/15 1030 06/09/15 1121 06/09/15 1609  BP: 95/52 116/50 127/57 122/68  Pulse: 71 76    Temp:   98 F (36.7 C) 98.4 F (36.9 C)  TempSrc:   Oral Oral  Resp: _0 Height:      Weight:      SpO2:   100% 100%    Intake/Output Summary (Last 24 hours) at 06/09/15 1733 Last data filed at 06/09/15 1300  Gross per 24 hour  Intake 997.03 ml  Output      0 ml  Net 997.03 ml   Filed Weights   06/07/15 1425 06/08/15 2101 06/09/15 0720  Weight: 70 kg (154 lb 5.2 oz) 68.811 kg (151 lb 11.2 oz) 68.6 kg (151 lb 3.8 oz)    Examination:  General exam: Pleasant middle-aged male, Comfortable, undergoing dialysis this morning. Respiratory system: Clear to auscultation. Respiratory effort normal. Left upper chest Port-A-Cath. Right upper chest IJ HD catheter site dressing clean and dry. Cardiovascular system: S1 & S2 heard, RRR. No JVD, murmurs, rubs, gallops or clicks. No pedal edema. Gastrointestinal system: Abdomen is nondistended, soft and nontender. No organomegaly or masses felt. Normal bowel sounds heard. Central nervous system: Alert and oriented. No focal neurological deficits. Extremities: Symmetric 5 x 5 power. Skin: No rashes, lesions or ulcers Psychiatry: Judgement and insight appear normal. Mood & affect appropriate.     Data Reviewed: I have personally reviewed following labs and imaging studies  CBC:  Recent Labs Lab 06/06/15 0605 06/07/15 1050 06/08/15 0410 06/08/15 1209 06/09/15 1617  WBC 2.1* 2.3* 3.7* 3.7* 4.4  HGB 7.0* 7.6* 7.8* 8.1* 8.7*  HCT 21.1*  23.6* 25.2* 25.8* 27.4*  MCV 79.6 80.5 83.2 83.2 81.5  PLT 92* 96* 97* 90* PENDING   Basic Metabolic Panel:  Recent Labs Lab 06/03/15 0935  06/05/15 1841 06/06/15 0605 06/07/15 1050 06/08/15 1209 06/09/15 0756  NA 136  < > 141 141 141 138 140  K 4.2  < > 4.0 3.5 3.4* 3.8 3.4*  CL 107  < > 111 102 102 98* 98*  CO2 16*  < > 17* _1 GLUCOSE 145*  < > 97 88 118* 199* 132*  BUN 65*  < > 71* 32* _2 CREATININE 9.48*  < > 9.36* 5.49* 4.53* 4.20* 5.23*  CALCIUM 7.1*  < > 7.3* 7.6* 8.0* 8.4* 8.6*  MG 1.9  --   --   --   --   --   --   PHOS  --   --   --   --  2.9  --  3.6  < > = values in this interval not displayed. GFR: Estimated Creatinine Clearance: 12.9 mL/min (by C-G formula based on Cr of 5.23). Liver Function Tests:  Recent Labs Lab 06/07/15 1050 06/09/15 0756  ALBUMIN 2.3* 2.3*   No results for input(s): LIPASE, AMYLASE in the last 168 hours. No results for input(s): AMMONIA in the last 168 hours. Coagulation Profile:  Recent Labs Lab 06/09/15 0458  INR 1.16   Cardiac Enzymes: No results for input(s): CKTOTAL, CKMB,  CKMBINDEX, TROPONINI in the last 168 hours. BNP (last 3 results) No results for input(s): PROBNP in the last 8760 hours. HbA1C: No results for input(s): HGBA1C in the last 72 hours. CBG:  Recent Labs Lab 06/08/15 1512 06/08/15 1701 06/08/15 2101 06/09/15 1110 06/09/15 1549  GLUCAP 130* 100* 217* 120* 222*   Lipid Profile: No results for input(s): CHOL, HDL, LDLCALC, TRIG, CHOLHDL, LDLDIRECT in the last 72 hours. Thyroid Function Tests: No results for input(s): TSH, T4TOTAL, FREET4, T3FREE, THYROIDAB in the last 72 hours. Anemia Panel: No results for input(s): VITAMINB12, FOLATE, FERRITIN, TIBC, IRON, RETICCTPCT in the last 72 hours. Urine analysis:    Component Value Date/Time   COLORURINE YELLOW 06/01/2015 [place] 06/01/2015 1540   LABSPEC 1.008 06/01/2015 1540   PHURINE 6.0 06/01/2015 1540    GLUCOSEU NEGATIVE 06/01/2015 1540   HGBUR TRACE* 06/01/2015 1540   BILIRUBINUR NEGATIVE 06/01/2015 1540   KETONESUR NEGATIVE 06/01/2015 1540   PROTEINUR 30* 06/01/2015 1540   UROBILINOGEN 1.0 01/16/2012 1902   NITRITE NEGATIVE 06/01/2015 1540   LEUKOCYTESUR NEGATIVE 06/01/2015 1540   Sepsis Labs: No results for input(s): PROCALCITON, LATICACIDVEN in the last 168 hours.  Recent Results (from the past 240 hour(s))  Surgical PCR screen     Status: None   Collection Time: 06/08/15  9:58 AM  Result Value Ref Range Status   MRSA, PCR NEGATIVE NEGATIVE Final   Staphylococcus aureus NEGATIVE NEGATIVE Final    Comment:        The Xpert SA Assay (FDA approved for NASAL specimens in patients over 62 years of age), is one component of a comprehensive surveillance program.  Test performance has been validated by [facility] for patients greater than or equal to 46 year old. It is not intended to diagnose infection nor to guide or monitor treatment.          Radiology Studies: No results found.      Scheduled Meds: . feeding supplement (ENSURE ENLIVE)  237 mL Oral BID BM  . insulin aspart  0-15 Units Subcutaneous TID WC  . insulin aspart  0-5 Units Subcutaneous QHS  . isosorbide mononitrate  30 mg Oral Daily  . sodium chloride flush  3 mL Intravenous Q12H  . THROMBI-PAD  1 each Topical Once  . warfarin  6 mg Oral ONCE-1800  . Warfarin - Pharmacist Dosing Inpatient   Does not apply q1800   Continuous Infusions: . sodium chloride 10 mL/hr at 06/09/15 0552  . heparin 800 Units/hr (06/09/15 0551)     LOS: 8 days    Time spent: 25 minutes.     [facility], MD [facility] Pager [phone]  If 7PM-7AM, please contact night-coverage [email or web address] Password [facility] 06/09/2015, 5:33 PM

## 2015-06-09 NOTE — Progress Notes (Addendum)
  [facility] Progress Note   Subjective: AVF surg yesterday, on HD this am.   Filed Vitals:   06/09/15 0725 06/09/15 0800 06/09/15 0830 06/09/15 0900  BP: 147/83 131/80 111/63 142/73  Pulse: 84 82 87 86  Temp:      TempSrc:      Resp: '15 15 13 16  '$ Height:      Weight:      SpO2:        Inpatient medications: . feeding supplement (ENSURE ENLIVE)  237 mL Oral BID BM  . insulin aspart  0-15 Units Subcutaneous TID WC  . insulin aspart  0-5 Units Subcutaneous QHS  . isosorbide mononitrate  30 mg Oral Daily  . sodium chloride flush  3 mL Intravenous Q12H  . THROMBI-PAD  1 each Topical Once  . warfarin  6 mg Oral ONCE-1800  . Warfarin - Pharmacist Dosing Inpatient   Does not apply q1800   . sodium chloride 10 mL/hr at 06/09/15 8101  . heparin 800 Units/hr (06/09/15 0551)   acetaminophen **OR** acetaminophen, LORazepam, morphine injection, ondansetron **OR** ondansetron (ZOFRAN) IV, oxyCODONE, sodium chloride flush  Exam: General: Alert, wdwn, no distress Neck: no jvd Lungs: clear bilat Heart: RRR no mrg Abdomen: Soft, ntnd +bs Extremities:1- 2+ LE edema  UA negative Renal US > 11-13 cm kidneys, no hydro, ^'d echo     Assessment:  1. Acute/ subacute renal failure - progressive renal failure over last 3 mos in setting of advanced myeloma.  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 VVS seeing for perm access and CLIP pending. Ready for dc today. OP HD TTS [employer], 2nd shift.   2. BP - no meds, bp's ok 3. Vol^ improving, counseled to limit fluid intake 4. Anemia - severe, from myeloma + CKD.  With history of cancer and DVT, risks of ESA outweigh benefit. Transfuse prn.   5. Multiple myeloma - refractory to mult regimens and sp SCT 2012 6. 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  On IV hep and coumadin to start tonight.    Plan - HD Sat, ok for dc when INR therapeutic   Kelly Splinter  MD  [facility] pager [phone]    cell [phone] 06/09/2015, 9:37 AM    Recent Labs Lab 06/07/15 1050 06/08/15 1209 06/09/15 0756  NA 141 138 140  K 3.4* 3.8 3.4*  CL 102 98* 98*  CO2 '29 28 29  '$ GLUCOSE 118* 199* 132*  BUN '19 16 19  '$ CREATININE 4.53* 4.20* 5.23*  CALCIUM 8.0* 8.4* 8.6*  PHOS 2.9  --  3.6    Recent Labs Lab 06/07/15 1050 06/09/15 0756  ALBUMIN 2.3* 2.3*    Recent Labs Lab 06/07/15 1050 06/08/15 0410 06/08/15 1209  WBC 2.3* 3.7* 3.7*  HGB 7.6* 7.8* 8.1*  HCT 23.6* 25.2* 25.8*  MCV 80.5 83.2 83.2  PLT 96* 97* 90*

## 2015-06-09 NOTE — Progress Notes (Signed)
ANTICOAGULATION CONSULT NOTE - Follow Up Consult  Pharmacy Consult for heparin and warfarin Indication: Hx of DVT (rivaroxaban held for renal failure)  No Known Allergies  Patient Measurements: Height: 5\' 11"  (180.3 cm) Weight: 151 lb 3.8 oz (68.6 kg) IBW/kg (Calculated) : 75.3  Vital Signs: Temp: 98.4 F (36.9 C) (05/20 1609) Temp Source: Oral (05/20 1609) BP: 122/68 mmHg (05/20 1609) Pulse Rate: 76 (05/20 1030)  Labs:  Recent Labs  06/06/15 2341  06/07/15 1050 06/08/15 0410 06/08/15 1209 06/09/15 0458 06/09/15 0756 06/09/15 1617  HGB  --   < > 7.6* 7.8* 8.1*  --   --  8.7*  HCT  --   < > 23.6* 25.2* 25.8*  --   --  27.4*  PLT  --   < > 96* 97* 90*  --   --  PENDING  LABPROT  --   --   --   --   --  15.0  --   --   INR  --   --   --   --   --  1.16  --   --   HEPARINUNFRC 0.15*  --   --   --   --  <0.10*  --  0.22*  CREATININE  --   --  4.53*  --  4.20*  --  5.23*  --   < > = values in this interval not displayed.  Estimated Creatinine Clearance: 12.9 mL/min (by C-G formula based on Cr of 5.23).   Medications:  Infusions:  . sodium chloride 10 mL/hr at 06/09/15 0552  . heparin 800 Units/hr (06/09/15 0551)    Assessment: 63 yoM admitted on 5/12 with acute on CKD (baseline SCr 1.6). PMH includes MM on Velcade/daratumumab/dexamethasone chemotherapy regimen, DVT (dx 06/2014) on chronic Xarelto anticoagulation. Xarelto is held for renal failure and pharmacy consulted to dose heparin. No transitioned to warfarin and heparin. INR is subtherapeutic as expected at 1.16 and heparin level is low at 0.22. No bleeding noted.   Goal of Therapy:  INR 2-3 Heparin level 0.3-0.7 units/ml (attempt to keep on lower end) Monitor platelets by anticoagulation protocol: Yes   Plan: - Increase heparin gtt to 900 units/hr - Check an 8 hour heparin level - Warfarin 6mg  PO x 1 tonight as previously ordered - Daily heparin level, INR and CBC  Salome Arnt, PharmD, BCPS Pager #  [phone] 06/09/2015 5:13 PM

## 2015-06-09 NOTE — Progress Notes (Signed)
06/09/2015 9:52 AM Hemodialysis Outpatient Note; this patient has been accepted at the [facility] dialysis center on a Tuesday, Thursday and Saturday 2nd shift schedule. The center can begin treatment on Tuesday May 23rd at 11:30 AM. Thank you. Gordy Savers

## 2015-06-10 LAB — GLUCOSE, CAPILLARY
GLUCOSE-CAPILLARY: 239 mg/dL — AB (ref 65–99)
GLUCOSE-CAPILLARY: 228 mg/dL — AB (ref 65–99)
Glucose-Capillary: 179 mg/dL — ABNORMAL HIGH (ref 65–99)
Glucose-Capillary: 177 mg/dL — ABNORMAL HIGH (ref 65–99)

## 2015-06-10 LAB — HEPARIN LEVEL (UNFRACTIONATED): HEPARIN UNFRACTIONATED: 0.59 [IU]/mL (ref 0.30–0.70)

## 2015-06-10 LAB — PROTIME-INR
INR: 1.36 (ref 0.00–1.49)
PROTHROMBIN TIME: 16.9 s — AB (ref 11.6–15.2)

## 2015-06-10 LAB — CBC
HCT: 24.3 % — ABNORMAL LOW (ref 39.0–52.0)
Hemoglobin: 7.6 g/dL — ABNORMAL LOW (ref 13.0–17.0)
MCH: 25.3 pg — ABNORMAL LOW (ref 26.0–34.0)
MCHC: 31.3 g/dL (ref 30.0–36.0)
MCV: 81 fL (ref 78.0–100.0)
PLATELETS: 83 10*3/uL — AB (ref 150–400)
RBC: 3 MIL/uL — ABNORMAL LOW (ref 4.22–5.81)
RDW: 16.4 % — AB (ref 11.5–15.5)
WBC: 4.4 10*3/uL (ref 4.0–10.5)

## 2015-06-10 MED ORDER — ZOLPIDEM TARTRATE 5 MG PO TABS
5.0000 mg | ORAL_TABLET | Freq: Every evening | ORAL | Status: DC | PRN
  Administered 2015-06-10: 5 mg via ORAL
  Filled 2015-06-10 (×2): qty 1

## 2015-06-10 MED ORDER — PANTOPRAZOLE SODIUM 40 MG PO TBEC
40.0000 mg | DELAYED_RELEASE_TABLET | Freq: Every day | ORAL | Status: DC
  Administered 2015-06-10 – 2015-06-14 (×5): 40 mg via ORAL
  Filled 2015-06-10 (×5): qty 1

## 2015-06-10 MED ORDER — WARFARIN SODIUM 5 MG PO TABS
5.0000 mg | ORAL_TABLET | Freq: Once | ORAL | Status: DC
Start: 2015-06-10 — End: 2015-06-10

## 2015-06-10 MED ORDER — BACLOFEN 10 MG PO TABS
5.0000 mg | ORAL_TABLET | Freq: Three times a day (TID) | ORAL | Status: DC | PRN
  Administered 2015-06-10 – 2015-06-11 (×3): 5 mg via ORAL
  Filled 2015-06-10 (×3): qty 1

## 2015-06-10 NOTE — Progress Notes (Signed)
At approximately 1930 last night, patient was vomiting nonstop.  He was vomiting dark green vomitus that smelled like stool.  [facility] was made aware.  Received order for IV Compazine which was given at 2003.  Also, order for KUB was received and done.  Results were negative and [facility] made aware.  Patient refused for Gastric Tube to be inserted but patient refused.  Vomiting continued to ease.  Zofran IV was given at 2202.  Patient continues to rest comfortably.  Will continue to monitor patient.  [employer] RN-BC, WTA.

## 2015-06-10 NOTE — Progress Notes (Signed)
PROGRESS NOTE    Eddie Thomas  OAC:[id number] DOB: 09/26/1945 DOA: 06/01/2015 PCP: [name]   Outpatient Specialists: Oncology dr Julien Nordmann.   Brief Narrative: Eddie Thomas is a 70 y.o. male with medical history significant of multiple myeloma, IgA subtype, currently undergoing treatment with Velcade/Daratumumab at the cancer center, follows Dr. Julien Nordmann of medical oncology, also having a history of chronic kidney disease (with baseline creatinine near 1.6-2.0), hypertension, diabetes mellitus, presented as a transfer from the cancer center for acute renal failure with creatinine of 10. Renal consulted and initiated hemodialysis.  Assessment & Plan:   Principal Problem:   Acute on chronic renal failure (HCC) Active Problems:   Multiple myeloma (HCC)   Hypertension   Bone marrow transplant complication (HCC)   Dehydration   Hypoalbuminemia due to protein-calorie malnutrition (HCC)   Antineoplastic chemotherapy induced pancytopenia ([place])   Long term current use of anticoagulant therapy   Acute renal failure (ARF) (HCC)   Acute renal failure complicating chronic kidney disease: - Nephrology was consulted. As per nephrology, creatinine started to rise in March-April 2017 up to 1.4-1.8 range with some bumps up to 2.3-2.5. On May 8, creatinine was 3.3. Now admitted with creatinine of 10. 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 Patient's myeloma is refractory to multiple different chemotherapy combinations and do not believe that his chemotherapy or pheresis is going to help his renal function. - Nephrology suggested palliative care consultation, to be pursued by oncology as outpatient. - Patient underwent HD 5/16-5/18 and 5/20 - TDC and AVF placed by [facility]. HD initiated by nephrology. Outpatient TTS dialysis. Cleared from nephrology standpoint for discharge.  Multiple Myeloma: - Follows with Dr Julien Nordmann.   Anemia - due to multiple myeloma, transfused 2 units PRBC this  admission. - Hemoglobin dropped from 8.7-7.6. Some bleeding from [facility] last night. Follow CBC in a.m. and transfuse if hemoglobin <7 g per DL.  Pancytopenia - Secondary to multiple myeloma and related cancer chemotherapy. - Anemia management as above. Leukopenia resolved. Thrombocytopenia stable.  Hyperkalemia - Resolved after dialysis.  NSVT: - Patient had 15 beats of nonsustained V. tach, hypomagnesemia corrected, 2-D echo done 11/2015 with EF 55-60% - Continue with telemetry monitor, would consider low-dose beta blocker if recurrence - May be related to renal failure, electrolyte abnormalities. Repeat 2-D echo: Normal EF.  History of DVT - Xarelto DC'ed given renal function - Discussed with Dr. Julien Nordmann on 5/17 and he recommended continued anticoagulation.  - Patient was initiated on IV heparin infusion and Coumadin on 5/20. He developed bleeding from [facility] site-has become a chronic issue while on IV heparin. As discussed with nephrology, discontinue all anticoagulation at this time-not good anticoagulation candidate due to bleeding complications. Discuss with primary oncologist in a.m.  Essential hypertension - ACEI stopped due to acute renal failure. Controlled.  Intractable hiccups, intermittent nausea and nonbloody emesis - Less likely due to uremia because it has continued despite dialysis. Not significantly responsive to Ativan. Trial of baclofen. Add PPI & antiemetics.    DVT prophylaxis: SCDs Code Status: Full Family Communication: Discussed with patient. None at bedside today. Disposition Plan: DC home when INR therapeutic.    Consultants:   Renal.  Oncology  IR  Vascular surgery  Procedures:   Right IJ HD catheter insertion by interventional radiology on 5/16  Hemodialysis initiated 5/16  2-D echo 06/08/15:   Study Conclusions  - Left ventricle: The cavity size was normal. Wall thickness was  normal. Systolic function was normal. The estimated  ejection  fraction was in the range of 55% to 65%. Features are consistent  with a pseudonormal left ventricular filling pattern, with  concomitant abnormal relaxation and increased filling pressure  (grade 2 diastolic dysfunction). - Mitral valve: There was moderate regurgitation. - Left atrium: The atrium was severely dilated. - Tricuspid valve: There was moderate regurgitation. - Pulmonary arteries: Systolic pressure was moderately increased.  PA peak pressure: 49 mm Hg (S).  left brachiocephalic AV fistula placement by vascular surgery on 5/19  Antimicrobials:  None  Subjective: Overnight bleeding from [facility] site while on IV heparin-heparin discontinued. Intractable hiccups, intermittent nausea and nonbloody emesis.  Objective: Filed Vitals:   06/09/15 1609 06/09/15 1921 06/10/15 0457 06/10/15 0719  BP: 122/68 145/78 146/70 112/74  Pulse:  100 94 101  Temp: 98.4 F (36.9 C) 99 F (37.2 C) 98.8 F (37.1 C) 100.5 F (38.1 C)  TempSrc: Oral   Oral  Resp: _0 Height:      Weight:      SpO2: 100% 98% 100% 100%    Intake/Output Summary (Last 24 hours) at 06/10/15 1407 Last data filed at 06/10/15 0721  Gross per 24 hour  Intake 360.15 ml  Output    900 ml  Net -539.85 ml   Filed Weights   06/07/15 1425 06/08/15 2101 06/09/15 0720  Weight: 70 kg (154 lb 5.2 oz) 68.811 kg (151 lb 11.2 oz) 68.6 kg (151 lb 3.8 oz)    Examination:  General exam: Pleasant middle-aged male, sitting up comfortably in chair this morning. Still having hiccups. Respiratory system: Clear to auscultation. Respiratory effort normal. Left upper chest Port-A-Cath. Right upper chest IJ HD catheter site dressing with some blood. Cardiovascular system: S1 & S2 heard, RRR. No JVD, murmurs, rubs, gallops or clicks. No pedal edema. Gastrointestinal system: Abdomen is nondistended, soft and nontender. No organomegaly or masses felt. Normal bowel sounds heard. Central nervous system: Alert and  oriented. No focal neurological deficits. Extremities: Symmetric 5 x 5 power. Skin: No rashes, lesions or ulcers Psychiatry: Judgement and insight appear normal. Mood & affect appropriate.     Data Reviewed: I have personally reviewed following labs and imaging studies  CBC:  Recent Labs Lab 06/07/15 1050 06/08/15 0410 06/08/15 1209 06/09/15 1617 06/10/15 0350  WBC 2.3* 3.7* 3.7* 4.4 4.4  HGB 7.6* 7.8* 8.1* 8.7* 7.6*  HCT 23.6* 25.2* 25.8* 27.4* 24.3*  MCV 80.5 83.2 83.2 81.5 81.0  PLT 96* 97* 90* 71* 83*   Basic Metabolic Panel:  Recent Labs Lab 06/05/15 1841 06/06/15 0605 06/07/15 1050 06/08/15 1209 06/09/15 0756  NA 141 141 141 138 140  K 4.0 3.5 3.4* 3.8 3.4*  CL 111 102 102 98* 98*  CO2 17* _1 GLUCOSE 97 88 118* 199* 132*  BUN 71* 32* _2 CREATININE 9.36* 5.49* 4.53* 4.20* 5.23*  CALCIUM 7.3* 7.6* 8.0* 8.4* 8.6*  PHOS  --   --  2.9  --  3.6   GFR: Estimated Creatinine Clearance: 12.9 mL/min (by C-G formula based on Cr of 5.23). Liver Function Tests:  Recent Labs Lab 06/07/15 1050 06/09/15 0756  ALBUMIN 2.3* 2.3*   No results for input(s): LIPASE, AMYLASE in the last 168 hours. No results for input(s): AMMONIA in the last 168 hours. Coagulation Profile:  Recent Labs Lab 06/09/15 0458 06/10/15 0350  INR 1.16 1.36   Cardiac Enzymes: No results for input(s): CKTOTAL, CKMB, CKMBINDEX, TROPONINI in the last 168 hours. BNP (last  3 results) No results for input(s): PROBNP in the last 8760 hours. HbA1C: No results for input(s): HGBA1C in the last 72 hours. CBG:  Recent Labs Lab 06/09/15 1549 06/09/15 1801 06/09/15 2142 06/10/15 0747 06/10/15 1150  GLUCAP 222* 233* 199* 179* 177*   Lipid Profile: No results for input(s): CHOL, HDL, LDLCALC, TRIG, CHOLHDL, LDLDIRECT in the last 72 hours. Thyroid Function Tests: No results for input(s): TSH, T4TOTAL, FREET4, T3FREE, THYROIDAB in the last 72 hours. Anemia Panel: No results  for input(s): VITAMINB12, FOLATE, FERRITIN, TIBC, IRON, RETICCTPCT in the last 72 hours. Urine analysis:    Component Value Date/Time   COLORURINE YELLOW 06/01/2015 [place] 06/01/2015 1540   LABSPEC 1.008 06/01/2015 1540   PHURINE 6.0 06/01/2015 1540   GLUCOSEU NEGATIVE 06/01/2015 1540   HGBUR TRACE* 06/01/2015 1540   BILIRUBINUR NEGATIVE 06/01/2015 1540   KETONESUR NEGATIVE 06/01/2015 1540   PROTEINUR 30* 06/01/2015 1540   UROBILINOGEN 1.0 01/16/2012 1902   NITRITE NEGATIVE 06/01/2015 1540   LEUKOCYTESUR NEGATIVE 06/01/2015 1540   Sepsis Labs: No results for input(s): PROCALCITON, LATICACIDVEN in the last 168 hours.  Recent Results (from the past 240 hour(s))  Surgical PCR screen     Status: None   Collection Time: 06/08/15  9:58 AM  Result Value Ref Range Status   MRSA, PCR NEGATIVE NEGATIVE Final   Staphylococcus aureus NEGATIVE NEGATIVE Final    Comment:        The Xpert SA Assay (FDA approved for NASAL specimens in patients over 13 years of age), is one component of a comprehensive surveillance program.  Test performance has been validated by [facility] for patients greater than or equal to 49 year old. It is not intended to diagnose infection nor to guide or monitor treatment.          Radiology Studies: Dg Abd Portable 1v  06/09/2015  CLINICAL DATA:  70 year old with acute nausea and vomiting. EXAM: PORTABLE ABDOMEN - 1 VIEW COMPARISON:  None. FINDINGS: Bowel gas pattern unremarkable without evidence of obstruction or significant ileus. Expected stool burden in the colon. Phleboliths low in the left side of the pelvis. No visible opaque urinary tract calculi. Degenerative changes involving the lumbar spine and sacroiliac joints. IMPRESSION: No acute abdominal abnormality. Electronically Signed   By: Evangeline Dakin M.D.   On: 06/09/2015 22:23        Scheduled Meds: . feeding supplement (ENSURE ENLIVE)  237 mL Oral BID BM  . insulin  aspart  0-15 Units Subcutaneous TID WC  . insulin aspart  0-5 Units Subcutaneous QHS  . sodium chloride flush  3 mL Intravenous Q12H  . THROMBI-PAD  1 each Topical Once  . warfarin  5 mg Oral ONCE-1800  . Warfarin - Pharmacist Dosing Inpatient   Does not apply q1800   Continuous Infusions: . sodium chloride 10 mL/hr at 06/09/15 0552     LOS: 9 days    Time spent: 25 minutes.     [facility], MD [facility] Pager [phone]  If 7PM-7AM, please contact night-coverage [email or web address] Password [email or web address] 06/10/2015, 2:07 PM

## 2015-06-10 NOTE — Progress Notes (Addendum)
ANTICOAGULATION CONSULT NOTE - Follow Up Consult  Pharmacy Consult for heparin Indication: h/o DVT  Labs:  Recent Labs  06/07/15 1050  06/08/15 1209 06/09/15 0458 06/09/15 0756 06/09/15 1617 06/10/15 0350  HGB 7.6*  < > 8.1*  --   --  8.7* 7.6*  HCT 23.6*  < > 25.8*  --   --  27.4* 24.3*  PLT 96*  < > 90*  --   --  71* PENDING  LABPROT  --   --   --  15.0  --   --  16.9*  INR  --   --   --  1.16  --   --  1.36  HEPARINUNFRC  --   --   --  <0.10*  --  0.22* 0.59  CREATININE 4.53*  --  4.20*  --  5.23*  --   --   < > = values in this interval not displayed.    Assessment/Plan:  70yo male therapeutic on heparin after rate change. Will continue gtt at current rate and confirm stable with additional level.   Wynona Neat, PharmD, BCPS  06/10/2015,4:41 AM   ADDENDUM: RN now reports that pt is bleeding profusely from HD catheter site.  Since pt is at higher end of goal will decrease rate back down to 800 units/hr and check level in 8hr.  In the meantime RN is contacting nephrology to possibly hold heparin; from pharmacy perspective there seems to be no contraindication to holding.  VB 7:17 AM   Dr Algis Liming is holding heparin until he consults w/ nephro.  VB 7:23 AM

## 2015-06-10 NOTE — Progress Notes (Addendum)
ANTICOAGULATION CONSULT NOTE - Follow Up Consult  Pharmacy Consult for warfarin Indication: DVT  No Known Allergies  Patient Measurements: Height: 5' 11"  (180.3 cm) Weight:  (pt has nausea, not feeling up to it) IBW/kg (Calculated) : 75.3  Vital Signs: Temp: 100.5 F (38.1 C) (05/21 0719) Temp Source: Oral (05/21 0719) BP: 112/74 mmHg (05/21 0719) Pulse Rate: 101 (05/21 0719)  Labs:  Recent Labs  06/08/15 1209 06/09/15 0458 06/09/15 0756 06/09/15 1617 06/10/15 0350  HGB 8.1*  --   --  8.7* 7.6*  HCT 25.8*  --   --  27.4* 24.3*  PLT 90*  --   --  71* 83*  LABPROT  --  15.0  --   --  16.9*  INR  --  1.16  --   --  1.36  HEPARINUNFRC  --  <0.10*  --  0.22* 0.59  CREATININE 4.20*  --  5.23*  --   --     Estimated Creatinine Clearance: 12.9 mL/min (by C-G formula based on Cr of 5.23).  Assessment:  70 y/o F on 06/01/2015 with acute renal failure. PMH of multiple myeloma on Velcade/daratumumab/dexamethasone chemotherapy regimen and hx of DVT on Xarelto pta. Due to renal failure transition to warfarin. Was on heparin bridge but developed a bleed so will continue warfarin alone. INR 1.16>1.36. Hgb 7.6, plts 83 and stable.   Goal of Therapy:  INR 2-3 Monitor platelets by anticoagulation protocol: Yes   Plan:  Discontinue heparin Warfarin po 98m x 1 tonight Daily INR Monitor CBC and for s/sx of bleeding  NAngela Burke PharmD Pharmacy Resident Pager: [phone]/21/2017,11:19 AM

## 2015-06-10 NOTE — Progress Notes (Signed)
At approximately 0500, this RN was called to patient's room.  He currently had 9 ml of Heparin IV infusing through his Left Port a Cath.  His dressing to his Rt HD Cath was saturated with blood and blood was slowly oozing through the right bottom half of the dressing.  During initial assessment of the patient at the beginning of the shift, the dressing was CDI.  Pressure Dressing was applied.  At approximately 37, hemodialysis RN came to assess the site.  At that times, the bleeding had stopped and a new dressing was applied.  VS remained stable.  [facility] was made aware at that time.  Pharmacy was made aware and no changes to Heparin at that time.  At approximately 0645, this RN was called by the patient to the room.  The patient was sitting in the chair beside the bed.  His dressing was saturated with blood.  His fitted sheet and bed pad had blood saturated to the mattress.  His VS at 0719:  T-100.5, B/P-112/74, HR-101, 100% Room Air.  Pharmacy was made aware and Heparin drip decreased to 8 ml.  Pressure dressing was applied to area.  [facility] was made aware and Heparin drip stopped at that point.  Will continue to monitor patient.  [employer] RN-BC, WTA.

## 2015-06-10 NOTE — Progress Notes (Signed)
  [facility] Progress Note   Subjective: bleeding from HD cath last night, IV hep stopped  Filed Vitals:   06/09/15 1609 06/09/15 1921 06/10/15 0457 06/10/15 0719  BP: 122/68 145/78 146/70 112/74  Pulse:  100 94 101  Temp: 98.4 F (36.9 C) 99 F (37.2 C) 98.8 F (37.1 C) 100.5 F (38.1 C)  TempSrc: Oral   Oral  Resp: '16 23 20 18  '$ Height:      Weight:      SpO2: 100% 98% 100% 100%    Inpatient medications: . feeding supplement (ENSURE ENLIVE)  237 mL Oral BID BM  . insulin aspart  0-15 Units Subcutaneous TID WC  . insulin aspart  0-5 Units Subcutaneous QHS  . sodium chloride flush  3 mL Intravenous Q12H  . THROMBI-PAD  1 each Topical Once  . warfarin  5 mg Oral ONCE-1800  . Warfarin - Pharmacist Dosing Inpatient   Does not apply q1800   . sodium chloride 10 mL/hr at 06/09/15 0102   acetaminophen **OR** acetaminophen, LORazepam, morphine injection, ondansetron **OR** ondansetron (ZOFRAN) IV, oxyCODONE, sodium chloride flush  Exam: General: Alert, wdwn, no distress Neck: no jvd Lungs: clear bilat Heart: RRR no mrg Abdomen: Soft, ntnd +bs Extremities:trace LE edema R IJ cath w bloody dressing, new LUA AVF +bruit NEuro is alert, calm, ox 3  UA negative Renal US > 11-13 cm kidneys, no hydro, ^'d echo     Assessment:  1. Acute/ subacute renal failure - progressive renal failure over last 3 mos in setting of advanced myeloma.  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 VVS has placed TDC and AVF. OP HD TTS [employer], 2nd shift.   2. Bleeding TDC site - IV hep stopped, agree not good candidate for anticoag at this time 3. BP - no meds, bp's ok 4. Vol^ - resolved for most part 5. 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  With history of cancer and DVT, risks of ESA outweigh benefit. Transfuse prn.   6. 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 As above, not good anticoag  candidate d/t bleeding complication   Plan - cont HD TTS.  For dc per primary team when medically stable.    Kelly Splinter MD [facility] pager [phone]    cell [phone] 06/10/2015, 1:50 PM    Recent Labs Lab 06/07/15 1050 06/08/15 1209 06/09/15 0756  NA 141 138 140  K 3.4* 3.8 3.4*  CL 102 98* 98*  CO2 '29 28 29  '$ GLUCOSE 118* 199* 132*  BUN '19 16 19  '$ CREATININE 4.53* 4.20* 5.23*  CALCIUM 8.0* 8.4* 8.6*  PHOS 2.9  --  3.6    Recent Labs Lab 06/07/15 1050 06/09/15 0756  ALBUMIN 2.3* 2.3*    Recent Labs Lab 06/08/15 1209 06/09/15 1617 06/10/15 0350  WBC 3.7* 4.4 4.4  HGB 8.1* 8.7* 7.6*  HCT 25.8* 27.4* 24.3*  MCV 83.2 81.5 81.0  PLT 90* 71* 83*

## 2015-06-11 ENCOUNTER — Other Ambulatory Visit: Payer: Medicare Other

## 2015-06-11 ENCOUNTER — Telehealth: Payer: Self-pay | Admitting: Vascular Surgery

## 2015-06-11 ENCOUNTER — Ambulatory Visit: Payer: Medicare Other

## 2015-06-11 LAB — GLUCOSE, CAPILLARY
GLUCOSE-CAPILLARY: 172 mg/dL — AB (ref 65–99)
GLUCOSE-CAPILLARY: 107 mg/dL — AB (ref 65–99)
Glucose-Capillary: 173 mg/dL — ABNORMAL HIGH (ref 65–99)
Glucose-Capillary: 115 mg/dL — ABNORMAL HIGH (ref 65–99)

## 2015-06-11 LAB — CBC
HEMATOCRIT: 21.2 % — AB (ref 39.0–52.0)
HEMOGLOBIN: 7 g/dL — AB (ref 13.0–17.0)
MCH: 25.9 pg — ABNORMAL LOW (ref 26.0–34.0)
MCHC: 32.5 g/dL (ref 30.0–36.0)
MCV: 79.7 fL (ref 78.0–100.0)
Platelets: 72 10*3/uL — ABNORMAL LOW (ref 150–400)
RBC: 2.66 MIL/uL — ABNORMAL LOW (ref 4.22–5.81)
RDW: 16.7 % — ABNORMAL HIGH (ref 11.5–15.5)
WBC: 5.9 10*3/uL (ref 4.0–10.5)

## 2015-06-11 LAB — PROTIME-INR
INR: 2.14 — AB (ref 0.00–1.49)
Prothrombin Time: 23.8 seconds — ABNORMAL HIGH (ref 11.6–15.2)

## 2015-06-11 MED ORDER — DARBEPOETIN ALFA 100 MCG/0.5ML IJ SOSY
100.0000 ug | PREFILLED_SYRINGE | INTRAMUSCULAR | Status: DC
  Administered 2015-06-12: 100 ug via INTRAVENOUS
  Filled 2015-06-11: qty 0.5

## 2015-06-11 MED ORDER — CHLORPROMAZINE HCL 10 MG PO TABS
10.0000 mg | ORAL_TABLET | Freq: Three times a day (TID) | ORAL | Status: DC | PRN
  Administered 2015-06-11: 10 mg via ORAL
  Filled 2015-06-11 (×3): qty 1

## 2015-06-11 MED ORDER — PROMETHAZINE HCL 25 MG PO TABS
12.5000 mg | ORAL_TABLET | Freq: Four times a day (QID) | ORAL | Status: DC | PRN
  Administered 2015-06-14: 12.5 mg via ORAL
  Filled 2015-06-11: qty 1

## 2015-06-11 MED ORDER — ONDANSETRON HCL 4 MG PO TABS
4.0000 mg | ORAL_TABLET | Freq: Three times a day (TID) | ORAL | Status: DC
  Administered 2015-06-11 – 2015-06-15 (×12): 4 mg via ORAL
  Filled 2015-06-11 (×12): qty 1

## 2015-06-11 NOTE — Progress Notes (Signed)
Addendum  As discussed with nephrology: Baclofen can cause neuro side effects in the context of renal failure. Hence discontinued baclofen and started Thorazine.  Vernell Leep, MD, FACP, FHM. [facility] Pager [phone]  If 7PM-7AM, please contact night-coverage [email or web address] Password [facility] 06/11/2015, 4:32 PM

## 2015-06-11 NOTE — Telephone Encounter (Signed)
Sched lab 6/26 at 3 and MD 6/27 at 8:30. Spoke to pt to inform them of appts.

## 2015-06-11 NOTE — Progress Notes (Signed)
  [facility] Progress Note   Subjective: No issues noted overnight - he was hoping he would go home today but I guess not yet - stomach is better   Filed Vitals:   06/10/15 1816 06/10/15 2010 06/11/15 0534 06/11/15 0802  BP: 164/71 153/70 146/69 115/61  Pulse: 102 103 98 110  Temp: 98.5 F (36.9 C) 98.3 F (36.8 C) 98.6 F (37 C) 98.2 F (36.8 C)  TempSrc: Oral   Oral  Resp: '18 18 17 18  '$ Height:      Weight:      SpO2: 100% 99% 99% 100%    Inpatient medications: . feeding supplement (ENSURE ENLIVE)  237 mL Oral BID BM  . insulin aspart  0-15 Units Subcutaneous TID WC  . insulin aspart  0-5 Units Subcutaneous QHS  . pantoprazole  40 mg Oral Daily  . sodium chloride flush  3 mL Intravenous Q12H  . THROMBI-PAD  1 each Topical Once   . sodium chloride 10 mL/hr at 06/09/15 4235   acetaminophen **OR** acetaminophen, baclofen, morphine injection, ondansetron **OR** ondansetron (ZOFRAN) IV, oxyCODONE, sodium chloride flush, zolpidem  Exam: General: Alert, wdwn, no distress Neck: no jvd Lungs: clear bilat Heart: RRR no mrg Abdomen: Soft, ntnd +bs Extremities:trace LE edema R IJ cath w bloody dressing, new LUA AVF +bruit NEuro is alert, calm, ox 3  UA negative Renal US > 11-13 cm kidneys, no hydro, ^'d echo     Assessment:  1. Acute/ subacute renal failure - progressive renal failure over last 3 mos in setting of advanced myeloma.  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 VVS has placed TDC and AVF. OP HD TTS [employer], 2nd shift.  HD tomorrow via PC 2. Bleeding TDC site - IV hep stopped, agree not good candidate for anticoag at this time 3. BP - no meds, bp's ok 4. Vol^ - resolved for most part 5. 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  With history of cancer and DVT, risks of ESA outweigh benefit. Transfuse prn.  Start ESA and check iron.  I can transfuse with HD tomorrow if desired  6. Multiple myeloma - refractory to  mult regimens and sp SCT 2012 7. 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 As above, not good anticoag candidate d/t bleeding complication 8. Bones- phos good on no binders- do not have PTH info 9. Dispo- feels that he will go home tomorrow- will do HD first shift       Deona Novitski A   06/11/2015, 12:43 PM    Recent Labs Lab 06/07/15 1050 06/08/15 1209 06/09/15 0756  NA 141 138 140  K 3.4* 3.8 3.4*  CL 102 98* 98*  CO2 '29 28 29  '$ GLUCOSE 118* 199* 132*  BUN '19 16 19  '$ CREATININE 4.53* 4.20* 5.23*  CALCIUM 8.0* 8.4* 8.6*  PHOS 2.9  --  3.6    Recent Labs Lab 06/07/15 1050 06/09/15 0756  ALBUMIN 2.3* 2.3*    Recent Labs Lab 06/09/15 1617 06/10/15 0350 06/11/15 0432  WBC 4.4 4.4 5.9  HGB 8.7* 7.6* 7.0*  HCT 27.4* 24.3* 21.2*  MCV 81.5 81.0 79.7  PLT 71* 83* 72*

## 2015-06-11 NOTE — Progress Notes (Signed)
PROGRESS NOTE    Eddie Thomas  IWL:[id number] DOB: 08-01-1945 DOA: 06/01/2015 PCP: Eddie Thomas   Outpatient Specialists: Oncology dr Julien Nordmann.   Brief Narrative: Eddie Thomas is a 70 y.o. male with medical history significant of multiple myeloma, IgA subtype, currently undergoing treatment with Velcade/Daratumumab at the cancer center, follows Dr. Julien Nordmann of medical oncology, also having a history of chronic kidney disease (with baseline creatinine near 1.6-2.0), hypertension, diabetes mellitus, presented as a transfer from the cancer center for acute renal failure with creatinine of 10. Renal consulted and initiated hemodialysis.  Assessment & Plan:   Principal Problem:   Acute on chronic renal failure (HCC) Active Problems:   Multiple myeloma (HCC)   Hypertension   Bone marrow transplant complication (HCC)   Dehydration   Hypoalbuminemia due to protein-calorie malnutrition (HCC)   Antineoplastic chemotherapy induced pancytopenia ([place])   Long term current use of anticoagulant therapy   Acute renal failure (ARF) (HCC)   Acute renal failure complicating chronic kidney disease: - Nephrology was consulted. As per nephrology, creatinine started to rise in March-April 2017 up to 1.4-1.8 range with some bumps up to 2.3-2.5. On May 8, creatinine was 3.3. Now admitted with creatinine of 10. 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 Patient's myeloma is refractory to multiple different chemotherapy combinations and do not believe that his chemotherapy or pheresis is going to help his renal function. - Nephrology suggested palliative care consultation, to be pursued by oncology as outpatient. - Patient underwent HD 5/16-5/18 and 5/20 - TDC and AVF placed by [facility]. HD initiated by nephrology. Outpatient TTS dialysis. Cleared from nephrology standpoint for discharge. Will get HD on 5/22.  Multiple Myeloma: - Follows with Dr Julien Nordmann.   Anemia - due to multiple myeloma, transfused 2  units PRBC this admission. - Hemoglobin dropped from 8.7>7.6>7 over the last 48 hours-possibly from earlier [facility] site bleeding which has since stopped after IV heparin was discontinued.  - Agree with transfusing 1 unit of PRBC across HD on 5/22.   Pancytopenia - Secondary to multiple myeloma and related cancer chemotherapy. - Anemia management as above. Leukopenia resolved. Thrombocytopenia stable.  Hyperkalemia - Resolved after dialysis.  NSVT: - Patient had 15 beats of nonsustained V. tach, hypomagnesemia corrected, 2-D echo done 11/2015 with EF 55-60% - Continue with telemetry monitor, would consider low-dose beta blocker if recurrence - May be related to renal failure, electrolyte abnormalities. Repeat 2-D echo: Normal EF.  History of DVT - Xarelto DC'ed given renal function - Discussed with Dr. Julien Nordmann on 5/17 and he had recommended continued anticoagulation. Attempted IV heparin bridging and Coumadin but each time IV heparin was initiated, patient had bleeding from [facility] site and hence all anticoagulation was stopped. Not good anticoagulation candidate due to bleeding complications. Discussed with Dr. Julien Nordmann on 5/21.  Essential hypertension - ACEI stopped due to acute renal failure. Controlled.  Intractable hiccups, intermittent nausea and nonbloody emesis - Less likely due to uremia because it has continued despite dialysis. Not significantly responsive to Ativan. Trial of baclofen. Add PPI & antiemetics. - Improved on baclofen, PPI and antiemetics but still had some symptoms. Continue management and monitor.    DVT prophylaxis: SCDs Code Status: Full Family Communication: Discussed with patient. None at bedside today. Disposition Plan: DC home possibly 5/22 after HD.   Consultants:   Renal.  Oncology  IR  Vascular surgery  Procedures:   Right IJ HD catheter insertion by interventional radiology on 5/16  Hemodialysis initiated 5/16  2-D echo 06/08/15:  Study  Conclusions  - Left ventricle: The cavity size was normal. Wall thickness was  normal. Systolic function was normal. The estimated ejection  fraction was in the range of 55% to 65%. Features are consistent  with a pseudonormal left ventricular filling pattern, with  concomitant abnormal relaxation and increased filling pressure  (grade 2 diastolic dysfunction). - Mitral valve: There was moderate regurgitation. - Left atrium: The atrium was severely dilated. - Tricuspid valve: There was moderate regurgitation. - Pulmonary arteries: Systolic pressure was moderately increased.  PA peak pressure: 49 mm Hg (S).  left brachiocephalic AV fistula placement by vascular surgery on 5/19  Antimicrobials:  None  Subjective: No further bleeding from [facility] site. States that his pickup, nausea and vomiting are better. Reports some constipation.  Objective: Filed Vitals:   06/10/15 1816 06/10/15 2010 06/11/15 0534 06/11/15 0802  BP: 164/71 153/70 146/69 115/61  Pulse: 102 103 98 110  Temp: 98.5 F (36.9 C) 98.3 F (36.8 C) 98.6 F (37 C) 98.2 F (36.8 C)  TempSrc: Oral   Oral  Resp: 18 18 17 18   Height:      Weight:      SpO2: 100% 99% 99% 100%    Intake/Output Summary (Last 24 hours) at 06/11/15 1311 Last data filed at 06/11/15 0900  Gross per 24 hour  Intake    460 ml  Output    100 ml  Net    360 ml   Filed Weights   06/07/15 1425 06/08/15 2101 06/09/15 0720  Weight: 70 kg (154 lb 5.2 oz) 68.811 kg (151 lb 11.2 oz) 68.6 kg (151 lb 3.8 oz)    Examination:  General exam: Pleasant middle-aged male, sitting up comfortably in chair this morning.  Respiratory system: Clear to auscultation. Respiratory effort normal. Left upper chest Port-A-Cath. Right upper chest IJ HD catheter site dressing clean and dry. Cardiovascular system: S1 & S2 heard, RRR. No JVD, murmurs, rubs, gallops or clicks. No pedal edema. Gastrointestinal system: Abdomen is nondistended, soft and nontender. No  organomegaly or masses felt. Normal bowel sounds heard. Central nervous system: Alert and oriented. No focal neurological deficits. Extremities: Symmetric 5 x 5 power. Skin: No rashes, lesions or ulcers Psychiatry: Judgement and insight appear normal. Mood & affect appropriate.     Data Reviewed: I have personally reviewed following labs and imaging studies  CBC:  Recent Labs Lab 06/08/15 0410 06/08/15 1209 06/09/15 1617 06/10/15 0350 06/11/15 0432  WBC 3.7* 3.7* 4.4 4.4 5.9  HGB 7.8* 8.1* 8.7* 7.6* 7.0*  HCT 25.2* 25.8* 27.4* 24.3* 21.2*  MCV 83.2 83.2 81.5 81.0 79.7  PLT 97* 90* 71* 83* 72*   Basic Metabolic Panel:  Recent Labs Lab 06/05/15 1841 06/06/15 0605 06/07/15 1050 06/08/15 1209 06/09/15 0756  NA 141 141 141 138 140  K 4.0 3.5 3.4* 3.8 3.4*  CL 111 102 102 98* 98*  CO2 17* 24 29 28 29   GLUCOSE 97 88 118* 199* 132*  BUN 71* 32* 19 16 19   CREATININE 9.36* 5.49* 4.53* 4.20* 5.23*  CALCIUM 7.3* 7.6* 8.0* 8.4* 8.6*  PHOS  --   --  2.9  --  3.6   GFR: Estimated Creatinine Clearance: 12.9 mL/min (by C-G formula based on Cr of 5.23). Liver Function Tests:  Recent Labs Lab 06/07/15 1050 06/09/15 0756  ALBUMIN 2.3* 2.3*   No results for input(s): LIPASE, AMYLASE in the last 168 hours. No results for input(s): AMMONIA in the last 168 hours. Coagulation Profile:  Recent Labs Lab 06/09/15 0458 06/10/15 0350 06/11/15 0432  INR 1.16 1.36 2.14*   Cardiac Enzymes: No results for input(s): CKTOTAL, CKMB, CKMBINDEX, TROPONINI in the last 168 hours. BNP (last 3 results) No results for input(s): PROBNP in the last 8760 hours. HbA1C: No results for input(s): HGBA1C in the last 72 hours. CBG:  Recent Labs Lab 06/10/15 1150 06/10/15 1710 06/10/15 2009 06/11/15 0755 06/11/15 1159  GLUCAP 177* 239* 228* 172* 173*   Lipid Profile: No results for input(s): CHOL, HDL, LDLCALC, TRIG, CHOLHDL, LDLDIRECT in the last 72 hours. Thyroid Function Tests: No  results for input(s): TSH, T4TOTAL, FREET4, T3FREE, THYROIDAB in the last 72 hours. Anemia Panel: No results for input(s): VITAMINB12, FOLATE, FERRITIN, TIBC, IRON, RETICCTPCT in the last 72 hours. Urine analysis:    Component Value Date/Time   COLORURINE YELLOW 06/01/2015 [place] 06/01/2015 1540   LABSPEC 1.008 06/01/2015 1540   PHURINE 6.0 06/01/2015 1540   GLUCOSEU NEGATIVE 06/01/2015 1540   HGBUR TRACE* 06/01/2015 1540   BILIRUBINUR NEGATIVE 06/01/2015 1540   KETONESUR NEGATIVE 06/01/2015 1540   PROTEINUR 30* 06/01/2015 1540   UROBILINOGEN 1.0 01/16/2012 1902   NITRITE NEGATIVE 06/01/2015 1540   LEUKOCYTESUR NEGATIVE 06/01/2015 1540   Sepsis Labs: No results for input(s): PROCALCITON, LATICACIDVEN in the last 168 hours.  Recent Results (from the past 240 hour(s))  Surgical PCR screen     Status: None   Collection Time: 06/08/15  9:58 AM  Result Value Ref Range Status   MRSA, PCR NEGATIVE NEGATIVE Final   Staphylococcus aureus NEGATIVE NEGATIVE Final    Comment:        The Xpert SA Assay (FDA approved for NASAL specimens in patients over 69 years of age), is one component of a comprehensive surveillance program.  Test performance has been validated by [facility] for patients greater than or equal to 65 year old. It is not intended to diagnose infection nor to guide or monitor treatment.          Radiology Studies: Dg Abd Portable 1v  06/09/2015  CLINICAL DATA:  70 year old with acute nausea and vomiting. EXAM: PORTABLE ABDOMEN - 1 VIEW COMPARISON:  None. FINDINGS: Bowel gas pattern unremarkable without evidence of obstruction or significant ileus. Expected stool burden in the colon. Phleboliths low in the left side of the pelvis. No visible opaque urinary tract calculi. Degenerative changes involving the lumbar spine and sacroiliac joints. IMPRESSION: No acute abdominal abnormality. Electronically Signed   By: Evangeline Dakin M.D.   On:  06/09/2015 22:23        Scheduled Meds: . [START ON 06/12/2015] darbepoetin (ARANESP) injection - DIALYSIS  100 mcg Intravenous Q Tue-HD  . feeding supplement (ENSURE ENLIVE)  237 mL Oral BID BM  . insulin aspart  0-15 Units Subcutaneous TID WC  . insulin aspart  0-5 Units Subcutaneous QHS  . pantoprazole  40 mg Oral Daily  . sodium chloride flush  3 mL Intravenous Q12H  . THROMBI-PAD  1 each Topical Once   Continuous Infusions: . sodium chloride 10 mL/hr at 06/09/15 0552     LOS: 10 days    Time spent: 25 minutes.     [facility], MD [facility] Pager [phone]  If 7PM-7AM, please contact night-coverage [email or web address] Password [email or web address] 06/11/2015, 1:11 PM

## 2015-06-11 NOTE — Telephone Encounter (Signed)
-----   Message from Mena Goes, RN sent at 06/11/2015 10:01 AM EDT ----- Regarding: schedule   ----- Message -----    From: Alvia Grove, PA-C    Sent: 06/11/2015   7:20 AM      To: [facility] Charge Pool  S/p left BC-AVF 06/08/15  F/u with Dr. Kellie Simmering in 6 weeks with duplex.  Thanks Maudie Mercury

## 2015-06-12 ENCOUNTER — Encounter (HOSPITAL_COMMUNITY): Payer: Self-pay | Admitting: Vascular Surgery

## 2015-06-12 ENCOUNTER — Inpatient Hospital Stay (HOSPITAL_COMMUNITY): Payer: Medicare Other

## 2015-06-12 DIAGNOSIS — F05 Delirium due to known physiological condition: Secondary | ICD-10-CM

## 2015-06-12 LAB — CBC
HEMATOCRIT: 18.8 % — AB (ref 39.0–52.0)
Hemoglobin: 5.8 g/dL — CL (ref 13.0–17.0)
MCH: 25.4 pg — ABNORMAL LOW (ref 26.0–34.0)
MCHC: 30.9 g/dL (ref 30.0–36.0)
MCV: 82.5 fL (ref 78.0–100.0)
Platelets: 70 10*3/uL — ABNORMAL LOW (ref 150–400)
RBC: 2.28 MIL/uL — AB (ref 4.22–5.81)
RDW: 16.9 % — ABNORMAL HIGH (ref 11.5–15.5)
WBC: 3.8 10*3/uL — AB (ref 4.0–10.5)

## 2015-06-12 LAB — RENAL FUNCTION PANEL
ALBUMIN: 2.3 g/dL — AB (ref 3.5–5.0)
ANION GAP: 18 — AB (ref 5–15)
BUN: 76 mg/dL — ABNORMAL HIGH (ref 6–20)
CALCIUM: 8.7 mg/dL — AB (ref 8.9–10.3)
CO2: 26 mmol/L (ref 22–32)
Chloride: 96 mmol/L — ABNORMAL LOW (ref 101–111)
Creatinine, Ser: 10.04 mg/dL — ABNORMAL HIGH (ref 0.61–1.24)
GFR calc non Af Amer: 5 mL/min — ABNORMAL LOW (ref >60)
GFR, EST AFRICAN AMERICAN: 5 mL/min — AB (ref >60)
Glucose, Bld: 161 mg/dL — ABNORMAL HIGH (ref 65–99)
PHOSPHORUS: 5.4 mg/dL — AB (ref 2.5–4.6)
Potassium: 4.3 mmol/L (ref 3.5–5.1)
SODIUM: 140 mmol/L (ref 135–145)

## 2015-06-12 LAB — PROTIME-INR
INR: 1.89 — ABNORMAL HIGH (ref 0.00–1.49)
Prothrombin Time: 21.6 seconds — ABNORMAL HIGH (ref 11.6–15.2)

## 2015-06-12 LAB — FERRITIN: Ferritin: 1822 ng/mL — ABNORMAL HIGH (ref 24–336)

## 2015-06-12 LAB — GLUCOSE, CAPILLARY
GLUCOSE-CAPILLARY: 110 mg/dL — AB (ref 65–99)
Glucose-Capillary: 119 mg/dL — ABNORMAL HIGH (ref 65–99)
Glucose-Capillary: 123 mg/dL — ABNORMAL HIGH (ref 65–99)

## 2015-06-12 LAB — HEMOGLOBIN AND HEMATOCRIT, BLOOD
HCT: 25.2 % — ABNORMAL LOW (ref 39.0–52.0)
Hemoglobin: 8 g/dL — ABNORMAL LOW (ref 13.0–17.0)

## 2015-06-12 LAB — PREPARE RBC (CROSSMATCH)

## 2015-06-12 LAB — IRON AND TIBC
Iron: 49 ug/dL (ref 45–182)
Saturation Ratios: 34 % (ref 17.9–39.5)
TIBC: 146 ug/dL — ABNORMAL LOW (ref 250–450)
UIBC: 97 ug/dL

## 2015-06-12 MED ORDER — HEPARIN SODIUM (PORCINE) 1000 UNIT/ML DIALYSIS: 1000.0000 [IU] | INTRAMUSCULAR | Status: DC | PRN

## 2015-06-12 MED ORDER — LIDOCAINE-PRILOCAINE 2.5-2.5 % EX CREA: 1.0000 "application " | TOPICAL_CREAM | CUTANEOUS | Status: DC | PRN

## 2015-06-12 MED ORDER — ALTEPLASE 2 MG IJ SOLR: 2.0000 mg | Freq: Once | INTRAMUSCULAR | Status: DC | PRN

## 2015-06-12 MED ORDER — SODIUM CHLORIDE 0.9 % IV SOLN: Freq: Once | INTRAVENOUS | Status: DC

## 2015-06-12 MED ORDER — HEPARIN SODIUM (PORCINE) 1000 UNIT/ML DIALYSIS: 4000.0000 [IU] | INTRAMUSCULAR | Status: DC | PRN

## 2015-06-12 MED ORDER — RENA-VITE PO TABS
1.0000 | ORAL_TABLET | Freq: Every day | ORAL | Status: DC
  Administered 2015-06-13 – 2015-06-14 (×3): 1 via ORAL
  Filled 2015-06-12 (×4): qty 1

## 2015-06-12 MED ORDER — SODIUM CHLORIDE 0.9 % IV SOLN: 100.0000 mL | INTRAVENOUS | Status: DC | PRN

## 2015-06-12 MED ORDER — DARBEPOETIN ALFA 100 MCG/0.5ML IJ SOSY
PREFILLED_SYRINGE | INTRAMUSCULAR | Status: AC
  Administered 2015-06-12: 100 ug via INTRAVENOUS
  Filled 2015-06-12: qty 0.5

## 2015-06-12 MED ORDER — LIDOCAINE HCL (PF) 1 % IJ SOLN: 5.0000 mL | INTRAMUSCULAR | Status: DC | PRN

## 2015-06-12 MED ORDER — PENTAFLUOROPROP-TETRAFLUOROETH EX AERO: 1.0000 "application " | INHALATION_SPRAY | CUTANEOUS | Status: DC | PRN

## 2015-06-12 NOTE — Progress Notes (Signed)
Addendum  RN reported fever off 101.6, heart rate 101. Initiate workup: Chest x-ray, urine microscopy and culture and blood cultures 2. Unclear etiology.? Related to dialysis. Has not started blood transfusion yet. Hold starting antibiotics for now, unless persistent fever, becomes unstable or workup reveals source.  Vernell Leep, MD, FACP, FHM. [facility] Pager [phone]  If 7PM-7AM, please contact night-coverage [email or web address] Password [facility] 06/12/2015, 3:57 PM

## 2015-06-12 NOTE — Progress Notes (Signed)
PROGRESS NOTE    Eddie Thomas  GYF:[id number] DOB: 12/20/1945 DOA: 06/01/2015 PCP: [name]   Brief Narrative:  Eddie Thomas is a 70 y.o. male with medical history significant of multiple myeloma, IgA subtype, currently undergoing treatment with Velcade/Daratumumab at the cancer center, follows Dr. Julien Nordmann of medical oncology, also having a history of chronic kidney disease (with baseline creatinine near 1.6-2.0), hypertension, diabetes mellitus, presented as a transfer from the cancer center for acute renal failure with creatinine of 10. Renal consulted, [name] & AVF placed by [facility] & HD started. Intractable hiccups- unclear etiology. ABLA complicating chronic anemia > multiple antibodies on type and screen- plan for 2 PRBC's on 5/23. Acute AMS on 5/23 -unclear etiology.  Assessment & Plan:   Principal Problem:   Acute on chronic renal failure (HCC) Active Problems:   Multiple myeloma (HCC)   Hypertension   Bone marrow transplant complication (HCC)   Dehydration   Hypoalbuminemia due to protein-calorie malnutrition (HCC)   Antineoplastic chemotherapy induced pancytopenia ([place])   Long term current use of anticoagulant therapy   Acute renal failure (ARF) (HCC)   Acute renal failure complicating chronic kidney disease: - Nephrology was consulted. As per nephrology, creatinine started to rise in March-April 2017 up to 1.4-1.8 range with some bumps up to 2.3-2.5. On May 8, creatinine was 3.3. Now admitted with creatinine of 10. 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 Patient's myeloma is refractory to multiple different chemotherapy combinations and do not believe that his chemotherapy or pheresis is going to help his renal function. - Nephrology suggested palliative care consultation, to be pursued by oncology as outpatient. - Patient undergoing hemodialysis per nephrology. Last HD on 5/22. [name] and AVF placed by [facility]. HD initiated by nephrology. Outpatient TTS dialysis >has been  arranged by nephrology.  Multiple Myeloma: - Follows with Dr Julien Nordmann.   Anemia - due to multiple myeloma, transfused 2 units PRBC earlier this admission. - Hemoglobin has dropped significantly in the last 48 hours from 7.6 > 5.8. Some of this was probably related to bleeding from [facility] site while he was on IV heparin-that has stopped since late Saturday. - Transfuse 2 units of PRBC and follow posttransfusion hemoglobin. Blood bank called and stated patient has multiple antibodies related to his myeloma medications but director of blood bank has cleared the available blood for transfusion. Verified same with oncologist who has had similar experience in the past.  Pancytopenia - Secondary to multiple myeloma and related cancer chemotherapy. - Anemia management as above. Thrombocytopenia stable.  Hyperkalemia - Resolved after dialysis.  NSVT: - Patient had 15 beats of nonsustained V. tach, hypomagnesemia corrected, 2-D echo done 11/2015 with EF 55-60% - Continue with telemetry monitor, would consider low-dose beta blocker if recurrence - May be related to renal failure, electrolyte abnormalities. Repeat 2-D echo: Normal EF.  History of DVT - Xarelto DC'ed given renal function - Discussed with Dr. Julien Nordmann on 5/17 and he had recommended continued anticoagulation. Attempted IV heparin bridging and Coumadin but each time IV heparin was initiated, patient had bleeding from [facility] site and hence all anticoagulation was stopped. Not good anticoagulation candidate due to bleeding complications. Discussed with Dr. Julien Nordmann on 5/21.  Essential hypertension - ACEI stopped due to acute renal failure. Controlled.  Intractable hiccups, intermittent nausea and nonbloody emesis - May not be due to uremia since it has persisted despite several hemodialysis. Briefly started on Ativan without significant improvement. Then was changed to baclofen (received 3 doses)-this was stopped on  5/22 due to concern for side  effects in renal failure. Started on Thorazine 5/22. Intermittent hiccups continue. All meds stopped due to altered mental status of unclear etiology.  Confusion/altered mental status - Noted sometime on 5/22 night or 5/23 morning. No focal deficits. CT head without acute findings.? Medication side effect. Discontinue opioids, Thorazine and Ambien. Monitor closely.    DVT prophylaxis: SCDs Code Status: Full Family Communication: Unable to reach patient's spouse and left message on voicemail. Discussed in detail with patient's son. Updated care and answered all questions. Disposition Plan: DC home when medically stable.   Consultants:   Renal.  Oncology  IR  Vascular surgery  Procedures:   Right IJ HD catheter insertion by interventional radiology on 5/16  Hemodialysis initiated 5/16  2-D echo 06/08/15:   Study Conclusions  - Left ventricle: The cavity size was normal. Wall thickness was  normal. Systolic function was normal. The estimated ejection  fraction was in the range of 55% to 65%. Features are consistent  with a pseudonormal left ventricular filling pattern, with  concomitant abnormal relaxation and increased filling pressure  (grade 2 diastolic dysfunction). - Mitral valve: There was moderate regurgitation. - Left atrium: The atrium was severely dilated. - Tricuspid valve: There was moderate regurgitation. - Pulmonary arteries: Systolic pressure was moderately increased.  PA peak pressure: 49 mm Hg (S).  left brachiocephalic AV fistula placement by vascular surgery on 5/19  Antimicrobials:  None  Subjective: Seen on HD this morning. Patient obviously confused but did not have hiccups at that time.  Objective: Filed Vitals:   06/12/15 1155 06/12/15 1221 06/12/15 1311 06/12/15 1502  BP: 121/60 128/74 141/59 148/62  Pulse: 92 95 100 100  Temp:  98.6 F (37 C) 99.3 F (37.4 C) 100 F (37.8 C)  TempSrc:  Oral Oral Oral  Resp:  18 17   Height:       Weight:  63 kg (138 lb 14.2 oz)    SpO2:  98% 100% 100%    Intake/Output Summary (Last 24 hours) at 06/12/15 1504 Last data filed at 06/12/15 1444  Gross per 24 hour  Intake    180 ml  Output   -434 ml  Net    614 ml   Filed Weights   06/11/15 2052 06/12/15 0815 06/12/15 1221  Weight: 64.139 kg (141 lb 6.4 oz) 64 kg (141 lb 1.5 oz) 63 kg (138 lb 14.2 oz)    Examination:  General exam: Pleasant middle-aged male lying comfortably in bed undergoing HD this morning. Respiratory system: Clear to auscultation. Respiratory effort normal. Left upper chest Port-A-Cath. Right upper chest IJ HD catheter site dressing clean and dry. Cardiovascular system: S1 & S2 heard, RRR. No JVD, murmurs, rubs, gallops or clicks. No pedal edema. Gastrointestinal system: Abdomen is nondistended, soft and nontender. No organomegaly or masses felt. Normal bowel sounds heard. Central nervous system: Alert and oriented to self. No focal neurological deficits. Appears confused. Keeps repeating himself. Extremities: Symmetric 5 x 5 power. Skin: No rashes, lesions or ulcers Psychiatry: Unable to assess today.     Data Reviewed: I have personally reviewed following labs and imaging studies  CBC:  Recent Labs Lab 06/08/15 1209 06/09/15 1617 06/10/15 0350 06/11/15 0432 06/12/15 0420  WBC 3.7* 4.4 4.4 5.9 3.8*  HGB 8.1* 8.7* 7.6* 7.0* 5.8*  HCT 25.8* 27.4* 24.3* 21.2* 18.8*  MCV 83.2 81.5 81.0 79.7 82.5  PLT 90* 71* 83* 72* 70*   Basic Metabolic Panel:  Recent Labs  Lab 06/06/15 0605 06/07/15 1050 06/08/15 1209 06/09/15 0756 06/12/15 0832  NA 141 141 138 140 140  K 3.5 3.4* 3.8 3.4* 4.3  CL 102 102 98* 98* 96*  CO2 _0 GLUCOSE 88 118* 199* 132* 161*  BUN 32* _1 76*  CREATININE 5.49* 4.53* 4.20* 5.23* 10.04*  CALCIUM 7.6* 8.0* 8.4* 8.6* 8.7*  PHOS  --  2.9  --  3.6 5.4*   GFR: Estimated Creatinine Clearance: 6.2 mL/min (by C-G formula based on Cr of 10.04). Liver  Function Tests:  Recent Labs Lab 06/07/15 1050 06/09/15 0756 06/12/15 0832  ALBUMIN 2.3* 2.3* 2.3*   No results for input(s): LIPASE, AMYLASE in the last 168 hours. No results for input(s): AMMONIA in the last 168 hours. Coagulation Profile:  Recent Labs Lab 06/09/15 0458 06/10/15 0350 06/11/15 0432 06/12/15 0420  INR 1.16 1.36 2.14* 1.89*   Cardiac Enzymes: No results for input(s): CKTOTAL, CKMB, CKMBINDEX, TROPONINI in the last 168 hours. BNP (last 3 results) No results for input(s): PROBNP in the last 8760 hours. HbA1C: No results for input(s): HGBA1C in the last 72 hours. CBG:  Recent Labs Lab 06/11/15 0755 06/11/15 1159 06/11/15 1709 06/11/15 2108 06/12/15 1309  GLUCAP 172* 173* 107* 115* 119*   Lipid Profile: No results for input(s): CHOL, HDL, LDLCALC, TRIG, CHOLHDL, LDLDIRECT in the last 72 hours. Thyroid Function Tests: No results for input(s): TSH, T4TOTAL, FREET4, T3FREE, THYROIDAB in the last 72 hours. Anemia Panel:  Recent Labs  06/12/15 0910  FERRITIN 1822*  TIBC 146*  IRON 49   Urine analysis:    Component Value Date/Time   COLORURINE YELLOW 06/01/2015 [place] 06/01/2015 1540   LABSPEC 1.008 06/01/2015 1540   PHURINE 6.0 06/01/2015 1540   GLUCOSEU NEGATIVE 06/01/2015 1540   HGBUR TRACE* 06/01/2015 1540   BILIRUBINUR NEGATIVE 06/01/2015 1540   KETONESUR NEGATIVE 06/01/2015 1540   PROTEINUR 30* 06/01/2015 1540   UROBILINOGEN 1.0 01/16/2012 1902   NITRITE NEGATIVE 06/01/2015 1540   LEUKOCYTESUR NEGATIVE 06/01/2015 1540   Sepsis Labs: No results for input(s): PROCALCITON, LATICACIDVEN in the last 168 hours.  Recent Results (from the past 240 hour(s))  Surgical PCR screen     Status: None   Collection Time: 06/08/15  9:58 AM  Result Value Ref Range Status   MRSA, PCR NEGATIVE NEGATIVE Final   Staphylococcus aureus NEGATIVE NEGATIVE Final    Comment:        The Xpert SA Assay (FDA approved for NASAL specimens in  patients over 55 years of age), is one component of a comprehensive surveillance program.  Test performance has been validated by [facility] for patients greater than or equal to 28 year old. It is not intended to diagnose infection nor to guide or monitor treatment.          Radiology Studies: Ct Head Wo Contrast  06/12/2015  CLINICAL DATA:  Confusion. Tearful. Undergoing treatment for multiple myeloma. Altered mental status. EXAM: CT HEAD WITHOUT CONTRAST TECHNIQUE: Contiguous axial images were obtained from the base of the skull through the vertex without intravenous contrast. COMPARISON:  None. FINDINGS: No acute cortical infarct, hemorrhage, or mass lesion is present. The ventricles are of normal size. No significant extra-axial fluid collection is evident. The paranasal sinuses and mastoid air cells are clear. The calvarium is intact. The globes and orbits are intact. No significant extracranial soft tissue lesion is present. IMPRESSION: Negative CT of the head. Electronically Signed  By: San Morelle M.D.   On: 06/12/2015 14:19        Scheduled Meds: . sodium chloride   Intravenous Once  . darbepoetin (ARANESP) injection - DIALYSIS  100 mcg Intravenous Q Tue-HD  . feeding supplement (ENSURE ENLIVE)  237 mL Oral BID BM  . insulin aspart  0-15 Units Subcutaneous TID WC  . insulin aspart  0-5 Units Subcutaneous QHS  . multivitamin  1 tablet Oral QHS  . ondansetron  4 mg Oral TID  . pantoprazole  40 mg Oral Daily  . sodium chloride flush  3 mL Intravenous Q12H  . THROMBI-PAD  1 each Topical Once   Continuous Infusions: . sodium chloride 10 mL/hr at 06/09/15 0552     LOS: 11 days    Time spent: 45 minutes.     [facility], MD [facility] Pager [phone]  If 7PM-7AM, please contact night-coverage [email or web address] Password [facility] 06/12/2015, 3:04 PM

## 2015-06-12 NOTE — Progress Notes (Signed)
[facility] Progress Note  Assessment/Plan: 1. Acute/ subacute renal failure - progressive renal failure over last 3 mos in setting of advanced myeloma. 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 [facility] has placed [facility] and AVF on 5/19. Set up OP HD TTS [employer], 2nd shift. HD today via PC - last K 3.4 - running on 4 K bath 2. Bleeding TDC site - IV hep stopped, agree not good candidate for anticoag at this time- no more obvious bleeding but hgb did go down 3. BP/volume - no meds, bp's ok- titrating edw - no overt volume 4. 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 With history of cancer and DVT, ESA started 5/23- draw Fe studies pre transfusion; hgb down to 5.8 this am - transfuse 2 units PRBC on HD- with Hgb this low and diagnosis, he may need more than 2 units or we will be chasing the Hgb after d/c - repeat Hgb this afternoon  5. Multiple myeloma - refractory to mult regimens and sp SCT 2012 6. 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 As above, not good anticoag candidate d/t bleeding complication 7. Bones- phos good on no binders- iPTH drawn today 8. Thrombocytopenia Plts 70 stable 9. Anxiety/tearfulness - hard time getting patient to explain what he is feeling- could this be the baclofen ? Got 3 doses- just is having trouble processing but feels it might be better by the time that I got to him- if is baclofen should clear with HD 10  Disp - favor watching overnight - repeat CBC in am  Myriam Jacobson, PA-C [place] [phone]  Patient seen and examined, agree with above note with above modifications. Seen on HD- tearful- says he feels confused but able to tell me his name- no aphasia- non focal- could be the baclofen given for 3 doses ? If is that should clear with HD- hgb precipitously dropping after bleeding in the setting of anticoagulation- anticoagulation stopped- giving 2 units PRBC today- I agree may need to stay til AM to make sure MS  clears and does not need more blood - ESA started  Corliss Parish, MD 06/12/2015     06/12/2015,8:47 AM  LOS: 11 days   Subjective:   Tearful, anxious. Denies SOB or pain  Objective Filed Vitals:   06/11/15 2052 06/12/15 0351 06/12/15 0815 06/12/15 0818  BP: 140/65 130/71 126/41 121/82  Pulse: 93 101 91 91  Temp: 98.4 F (36.9 C) 99.1 F (37.3 C) 97 F (36.1 C)   TempSrc: Oral Oral Oral   Resp: _0 Height:      Weight: 64.139 kg (141 lb 6.4 oz)  64 kg (141 lb 1.5 oz)   SpO2: 95% 99% 98%    Physical Exam General: Anxious Heart: tachy reg Lungs: clear without rales Abdomen: soft NT Extremities: no edema Dialysis Access:  Right IJ and left upper AVF maturing + bruit  Dialysis Orders:  Additional Objective Labs: Lab Results  Component Value Date   INR 1.89* 06/12/2015   INR 2.14* 06/11/2015   INR 1.36 76/72/0947    Basic Metabolic Panel:  Recent Labs Lab 06/07/15 1050 06/08/15 1209 06/09/15 0756  NA 141 138 140  K 3.4* 3.8 3.4*  CL 102 98* 98*  CO2 _1 GLUCOSE 118* 199* 132*  BUN _2 CREATININE 4.53* 4.20* 5.23*  CALCIUM 8.0* 8.4* 8.6*  PHOS 2.9  --  3.6  Liver Function Tests:  Recent Labs Lab 06/07/15 1050 06/09/15 0756  ALBUMIN 2.3* 2.3*   CBC:  Recent Labs Lab 06/08/15 1209 06/09/15 1617 06/10/15 0350 06/11/15 0432 06/12/15 0420  WBC 3.7* 4.4 4.4 5.9 3.8*  HGB 8.1* 8.7* 7.6* 7.0* 5.8*  HCT 25.8* 27.4* 24.3* 21.2* 18.8*  MCV 83.2 81.5 81.0 79.7 82.5  PLT 90* 71* 83* 72* 70*   CBG:  Recent Labs Lab 06/10/15 2009 06/11/15 0755 06/11/15 1159 06/11/15 1709 06/11/15 2108  GLUCAP 228* 172* 173* 107* 115*   Medications: . sodium chloride 10 mL/hr at 06/09/15 0552   . sodium chloride   Intravenous Once  . darbepoetin (ARANESP) injection - DIALYSIS  100 mcg Intravenous Q Tue-HD  . feeding supplement (ENSURE ENLIVE)  237 mL Oral BID BM  . insulin aspart  0-15 Units Subcutaneous TID WC  . insulin aspart   0-5 Units Subcutaneous QHS  . ondansetron  4 mg Oral TID  . pantoprazole  40 mg Oral Daily  . sodium chloride flush  3 mL Intravenous Q12H  . THROMBI-PAD  1 each Topical Once

## 2015-06-12 NOTE — Procedures (Signed)
Patient was seen on dialysis and the procedure was supervised.  BFR 400  Via PC BP is  97/56.   Patient appears to be tolerating treatment well- see my progress note regarding confusion this AM  Eddie Thomas A 06/12/2015

## 2015-06-12 NOTE — Care Management Important Message (Signed)
Important Message  Patient Details  Name: Eddie Thomas MRN: [id number] Date of Birth: [birth year]   Medicare Important Message Given:  Yes    Loann Quill 06/12/2015, 2:23 PM

## 2015-06-12 NOTE — Progress Notes (Signed)
Blood bank called RN to inform RN of incompatibility of blood. MD made aware. Awaiting MD to call with further instructions.    Eddie Thomas

## 2015-06-12 NOTE — Progress Notes (Signed)
Results for KORTLAND, LEONARDO (MRN [id number]) as of 06/12/2015 05:16    Ref. Range 06/12/2015 04:20  Hemoglobin Latest Ref Range: 13.0-17.0 g/dL 5.8 (LL)  Results given to [facility] NP

## 2015-06-13 DIAGNOSIS — R4182 Altered mental status, unspecified: Secondary | ICD-10-CM | POA: Insufficient documentation

## 2015-06-13 DIAGNOSIS — R410 Disorientation, unspecified: Secondary | ICD-10-CM

## 2015-06-13 DIAGNOSIS — N186 End stage renal disease: Secondary | ICD-10-CM | POA: Insufficient documentation

## 2015-06-13 DIAGNOSIS — R509 Fever, unspecified: Secondary | ICD-10-CM

## 2015-06-13 LAB — PROTIME-INR
INR: 1.77 — AB (ref 0.00–1.49)
PROTHROMBIN TIME: 20.6 s — AB (ref 11.6–15.2)

## 2015-06-13 LAB — BASIC METABOLIC PANEL
Anion gap: 16 — ABNORMAL HIGH (ref 5–15)
BUN: 35 mg/dL — ABNORMAL HIGH (ref 6–20)
CO2: 24 mmol/L (ref 22–32)
Calcium: 8 mg/dL — ABNORMAL LOW (ref 8.9–10.3)
Chloride: 97 mmol/L — ABNORMAL LOW (ref 101–111)
Creatinine, Ser: 5.5 mg/dL — ABNORMAL HIGH (ref 0.61–1.24)
GFR calc Af Amer: 11 mL/min — ABNORMAL LOW (ref >60)
GFR calc non Af Amer: 10 mL/min — ABNORMAL LOW (ref >60)
Glucose, Bld: 139 mg/dL — ABNORMAL HIGH (ref 65–99)
Potassium: 4.1 mmol/L (ref 3.5–5.1)
Sodium: 137 mmol/L (ref 135–145)

## 2015-06-13 LAB — GLUCOSE, CAPILLARY
GLUCOSE-CAPILLARY: 153 mg/dL — AB (ref 65–99)
GLUCOSE-CAPILLARY: 178 mg/dL — AB (ref 65–99)
GLUCOSE-CAPILLARY: 147 mg/dL — AB (ref 65–99)
GLUCOSE-CAPILLARY: 102 mg/dL — AB (ref 65–99)

## 2015-06-13 LAB — CBC
HCT: 27.9 % — ABNORMAL LOW (ref 39.0–52.0)
Hemoglobin: 8.9 g/dL — ABNORMAL LOW (ref 13.0–17.0)
MCH: 26.5 pg (ref 26.0–34.0)
MCHC: 31.9 g/dL (ref 30.0–36.0)
MCV: 83 fL (ref 78.0–100.0)
PLATELETS: 82 10*3/uL — AB (ref 150–400)
RBC: 3.36 MIL/uL — AB (ref 4.22–5.81)
RDW: 15.9 % — ABNORMAL HIGH (ref 11.5–15.5)
WBC: 4.8 10*3/uL (ref 4.0–10.5)

## 2015-06-13 LAB — PARATHYROID HORMONE, INTACT (NO CA): PTH: 397 pg/mL — ABNORMAL HIGH (ref 15–65)

## 2015-06-13 LAB — T4, FREE: Free T4: 1.04 ng/dL (ref 0.61–1.12)

## 2015-06-13 LAB — VITAMIN B12: VITAMIN B 12: 266 pg/mL (ref 180–914)

## 2015-06-13 LAB — AMMONIA: Ammonia: 24 umol/L (ref 9–35)

## 2015-06-13 LAB — TSH: TSH: 0.493 u[IU]/mL (ref 0.350–4.500)

## 2015-06-13 MED ORDER — CHLORPROMAZINE HCL 25 MG PO TABS
25.0000 mg | ORAL_TABLET | Freq: Three times a day (TID) | ORAL | Status: DC | PRN
Start: 2015-06-13 — End: 2015-06-14
  Filled 2015-06-13: qty 1

## 2015-06-13 MED ORDER — CALCITRIOL 0.25 MCG PO CAPS
0.2500 ug | ORAL_CAPSULE | ORAL | Status: DC
  Administered 2015-06-14: 0.25 ug via ORAL
  Filled 2015-06-13: qty 1

## 2015-06-13 NOTE — Progress Notes (Signed)
[facility] Progress Note  Assessment/Plan: 1. Progressive renal failure over last 3 mos in setting of advanced myeloma. 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 VVS has placed [facility] and AVF on 5/19. Set up OP HD TTS [employer], 2nd shift. Next HD Thursday Cr 10 > 5 with HD 2. Bleeding TDC site resolved- IV hep stopped, agree not good candidate for anticoag at this time- no more obvious bleeding but hgb drop 3. BP/volume - no meds, bp's ok- titrating edw - no overt volume 4. 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 With history of cancer and DVT, ESA started 5/23- Fe studies pre transfusion 34% sat ferritin 1822- given transfusion Fe load, no indication for additional Fe now; hgb down to 5.8 this am - transfused total of 2 units PRBC; Hgb up to 8.9 today from 5.8- seems a large increase - 5.8 may have been a false low as it was 7 previously 5. Multiple myeloma - refractory to mult regimens and sp SCT 2012 6. 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 As above, not good anticoag candidate d/t bleeding complication 7. Bones- phos good on no binders- iPTH 5/23 397 - start low dose calcitriol Ca ok 8. Thrombocytopenia Plts 82 stable 9. Anxiety/tearfulness -5/23 - resolved - ? Reaction to meds or precursor to fever spike (favor this)- seems back to baseline today 10 Fever spike 5/23 101.5 -99.1 this am - has TDC - CXR neg, WBC unchanged Blood cultures drawn- no abtx given yet.  Healing left upper AVF site ok. Asymptomatic except some N, V, anorexia and mental status changes yesterday. If he spikes a temp again today, favor starting empiric antibioitcs given he has a TDC and is a MM pt.  Myriam Jacobson, PA-C [place] [phone] 06/13/2015,9:42 AM  LOS: 12 days    Patient seen and examined, agree with above note with above modifications. Seems better today but now with hiccups- will resume thorazine as I dont think that was the issue- if spikes again  today I agree with empiric abx  Corliss Parish, MD 06/13/2015     Subjective:   Hiccups resumed. States they started after he starting getting meds for MM. Felt horrible yesterday. Some N and V - vomiting up cranberry juice this am. No diarrhea, cough or SOB. I am going to resume thorazine as I dont think that was the issue yest  Objective Filed Vitals:   06/12/15 2215 06/12/15 2240 06/13/15 0200 06/13/15 0443  BP: 144/66 151/61 147/67 154/62  Pulse: 83 87 83 87  Temp: 98.4 F (36.9 C) 98.5 F (36.9 C) 98.9 F (37.2 C) 99.1 F (37.3 C)  TempSrc: Oral Oral Oral Oral  Resp: _0 Height:      Weight:      SpO2: 98% 98% 97% 93%   Physical Exam General: persistent hiccups, calm able to express self, oriented x 3 Heart: RRR Lungs: no rales Abdomen: soft NT Extremities: no LE edema Dialysis Access:  Left upper AVF + bruit and right IJ exit site gel foam around exit site  Dialysis Orders: new start for out pt TTS [facility]  Additional Objective Labs: Basic Metabolic Panel:  Recent Labs Lab 06/07/15 1050  06/09/15 0756 06/12/15 0832 06/13/15 0500  NA 141  < > 140 140 137  K 3.4*  < > 3.4* 4.3 4.1  CL 102  < > 98* 96* 97*  CO2 29  < > 29  26 24  GLUCOSE 118*  < > 132* 161* 139*  BUN 19  < > 19 76* 35*  CREATININE 4.53*  < > 5.23* 10.04* 5.50*  CALCIUM 8.0*  < > 8.6* 8.7* 8.0*  PHOS 2.9  --  3.6 5.4*  --   < > = values in this interval not displayed. Liver Function Tests:  Recent Labs Lab 06/07/15 1050 06/09/15 0756 06/12/15 0832  ALBUMIN 2.3* 2.3* 2.3*   CBC:  Recent Labs Lab 06/09/15 1617 06/10/15 0350 06/11/15 0432 06/12/15 0420 06/12/15 2058 06/13/15 0500  WBC 4.4 4.4 5.9 3.8*  --  4.8  HGB 8.7* 7.6* 7.0* 5.8* 8.0* 8.9*  HCT 27.4* 24.3* 21.2* 18.8* 25.2* 27.9*  MCV 81.5 81.0 79.7 82.5  --  83.0  PLT 71* 83* 72* 70*  --  82*   CBG:  Recent Labs Lab 06/11/15 2108 06/12/15 1309 06/12/15 1620 06/12/15 2044 06/13/15 0732  GLUCAP 115*  119* 123* 110* 153*   Iron Studies:  Recent Labs  06/12/15 0910  IRON 49  TIBC 146*  FERRITIN 1822*    Studies/Results: Ct Head Wo Contrast  06/12/2015  CLINICAL DATA:  Confusion. Tearful. Undergoing treatment for multiple myeloma. Altered mental status. EXAM: CT HEAD WITHOUT CONTRAST TECHNIQUE: Contiguous axial images were obtained from the base of the skull through the vertex without intravenous contrast. COMPARISON:  None. FINDINGS: No acute cortical infarct, hemorrhage, or mass lesion is present. The ventricles are of normal size. No significant extra-axial fluid collection is evident. The paranasal sinuses and mastoid air cells are clear. The calvarium is intact. The globes and orbits are intact. No significant extracranial soft tissue lesion is present. IMPRESSION: Negative CT of the head. Electronically Signed   By: San Morelle M.D.   On: 06/12/2015 14:19   Dg Chest Port 1 View  06/12/2015  CLINICAL DATA:  One day of fever, history of multiple myeloma, bone marrow transplant with complication, dialysis dependent renal failure. EXAM: PORTABLE CHEST 1 VIEW COMPARISON:  No recent chest x-ray in PACs comparison is made to a chest x-ray of November 21, 2011 FINDINGS: The lungs are adequately inflated. There is no focal infiltrate. There is no pleural effusion or pneumothorax. A dialysis catheter is in place via the right internal jugular approach with the tip projecting over the midportion of the SVC. A Port-A-Cath appliance is present with its tip projecting over the midportion of the SVC. The heart is normal in size. The pulmonary vascularity is not engorged. The bony thorax exhibits no acute abnormality. The patient has undergone previous plate and screw fixation for proximal humeral fracture. IMPRESSION: There is no evidence of pneumonia. No acute cardiopulmonary abnormality is observed. Electronically Signed   By: David  Martinique M.D.   On: 06/12/2015 16:50   Medications: . sodium  chloride 10 mL/hr at 06/09/15 0552   . sodium chloride   Intravenous Once  . darbepoetin (ARANESP) injection - DIALYSIS  100 mcg Intravenous Q Tue-HD  . feeding supplement (ENSURE ENLIVE)  237 mL Oral BID BM  . insulin aspart  0-15 Units Subcutaneous TID WC  . insulin aspart  0-5 Units Subcutaneous QHS  . multivitamin  1 tablet Oral QHS  . ondansetron  4 mg Oral TID  . pantoprazole  40 mg Oral Daily  . sodium chloride flush  3 mL Intravenous Q12H  . THROMBI-PAD  1 each Topical Once

## 2015-06-13 NOTE — Progress Notes (Addendum)
PROGRESS NOTE  Eddie Thomas QQI:[id number] DOB: [birth year] DOA: 06/01/2015 PCP: [name] Brief Narrative:  Eddie Thomas is a 70 y.o. male with medical history significant of multiple myeloma, IgA subtype, currently undergoing treatment with Velcade/Daratumumab at the cancer center, follows Dr. Julien Nordmann of medical oncology, also having a history of chronic kidney disease (with baseline creatinine near 1.6-2.0), hypertension, diabetes mellitus, presented as a transfer from the cancer center for acute renal failure with creatinine of 10. Renal consulted, [facility] & AVF placed by [facility] & HD started. Intractable hiccups- unclear etiology. ABLA complicating chronic anemia > multiple antibodies on type and screen- plan for 2 PRBC's on 5/23. Acute AMS on 5/23 -unclear etiology.  Assessment & Plan: Acute renal failure complicating chronic kidney disease: - Nephrology was consulted. As per nephrology, creatinine started to rise in March-April 2017 up to 1.4-1.8 range with some bumps up to 2.3-2.5. On May 8, creatinine was 3.3. Now admitted with creatinine of 10. 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 Patient's myeloma is refractory to multiple different chemotherapy combinations and do not believe that his chemotherapy or pheresis is going to help his renal function. - Nephrology suggested palliative care consultation, to be pursued by oncology as outpatient. - Patient undergoing hemodialysis per nephrology. Last HD on 5/22. [facility] and AVF placed by [facility]. HD initiated by nephrology. Outpatient TTS dialysis >has been arranged by nephrology.  Acute encephalopathy -developed between 5/22 night and 5/23 AM -likely multifactorial including infection, meds, worsen anemia -new fever on 06/12/15 of 101.6 -06/12/15 CT brain negative -06/13/15--mental status improving -Discontinue Thorazine, baclofen, opioids, other hypnotics -check ammonia, B12, TSH, Free T4  Multiple Myeloma: - Follows with Dr  Julien Nordmann.  -refractory to multiple chemo regimens  Anemia - due to multiple myeloma, transfused 2 units PRBC earlier this admission. - Hemoglobin has dropped significantly in the last 48 hours from 7.6 > 5.8. Some of this was probably related to bleeding from [facility] site while he was on IV heparin-that has stopped since late Saturday. - Transfuse 2 units of PRBC and follow posttransfusion hemoglobin. Blood bank called and stated patient has multiple antibodies related to his myeloma medications but director of blood bank has cleared the available blood for transfusion. Verified same with oncologist who has had similar experience in the past.  Pancytopenia - Secondary to multiple myeloma and related cancer chemotherapy. - Anemia management as above. Thrombocytopenia stable.  Hyperkalemia - Resolved after dialysis.  NSVT: - Patient had 15 beats of nonsustained V. tach, hypomagnesemia corrected, 2-D echo done 11/2015 with EF 55-60% - Continue with telemetry monitor, would consider low-dose beta blocker if recurrence - May be related to renal failure, electrolyte abnormalities. Repeat 2-D echo: Normal EF.  History of DVT - Xarelto DC'ed given renal function - Discussed with Dr. Julien Nordmann on 5/17 and he had recommended continued anticoagulation. Attempted IV heparin bridging and Coumadin but each time IV heparin was initiated, patient had bleeding from [facility] site and hence all anticoagulation was stopped. Not good anticoagulation candidate due to bleeding complications. Discussed with Dr. Julien Nordmann on 5/21.  Essential hypertension - ACEI stopped due to acute renal failure. Controlled.  Intractable hiccups, intermittent nausea and nonbloody emesis - May not be due to uremia since it has persisted despite several hemodialysis. Briefly started on Ativan without significant improvement. Then was changed to baclofen (received 3 doses)-this was stopped on 5/22 due to concern for side effects in renal failure.  Started on Thorazine  5/22.  All meds stopped due to altered mental status of unclear etiology. -Intermittent hiccups continue since discontinuation of hypnotic medications    DVT prophylaxis: SCDs Code Status: Full Family Communication: No family present Disposition Plan: Possible discharge home 06/14/2015 if afebrile and medically stable   Consultants:   Renal.  Oncology  IR  Vascular surgery  Procedures:   Right IJ HD catheter insertion by interventional radiology on 5/16  Hemodialysis initiated 5/16  2-D echo 06/08/15:  Subjective: Patient denies fevers, chills, headache, chest pain, dyspnea, nausea, vomiting, diarrhea, abdominal pain,   Objective: Filed Vitals:   06/12/15 2215 06/12/15 2240 06/13/15 0200 06/13/15 0443  BP: 144/66 151/61 147/67 154/62  Pulse: 83 87 83 87  Temp: 98.4 F (36.9 C) 98.5 F (36.9 C) 98.9 F (37.2 C) 99.1 F (37.3 C)  TempSrc: Oral Oral Oral Oral  Resp: 18 20 20 17   Height:      Weight:      SpO2: 98% 98% 97% 93%    Intake/Output Summary (Last 24 hours) at 06/13/15 0857 Last data filed at 06/13/15 0600  Gross per 24 hour  Intake   1185 ml  Output   -434 ml  Net   1619 ml   Weight change: -0.139 kg (-4.9 oz) Exam:   General:  Pt is alert, follows commands appropriately, not in acute distress  HEENT: No icterus, No thrush, No neck mass, [place]/AT  Cardiovascular: RRR, S1/S2, no rubs, no gallops  Respiratory: CTA bilaterally, no wheezing, no crackles, no rhonchi  Abdomen: Soft/+BS, non tender, non distended, no guarding  Extremities: No edema, No lymphangitis, No petechiae, No rashes, no synovitis   Data Reviewed: I have personally reviewed following labs and imaging studies Basic Metabolic Panel:  Recent Labs Lab 06/07/15 1050 06/08/15 1209 06/09/15 0756 06/12/15 0832 06/13/15 0500  NA 141 138 140 140 137  K 3.4* 3.8 3.4* 4.3 4.1  CL 102 98* 98* 96* 97*  CO2 29 28 29 26 24   GLUCOSE 118* 199* 132* 161*  139*  BUN 19 16 19  76* 35*  CREATININE 4.53* 4.20* 5.23* 10.04* 5.50*  CALCIUM 8.0* 8.4* 8.6* 8.7* 8.0*  PHOS 2.9  --  3.6 5.4*  --    Liver Function Tests:  Recent Labs Lab 06/07/15 1050 06/09/15 0756 06/12/15 0832  ALBUMIN 2.3* 2.3* 2.3*   No results for input(s): LIPASE, AMYLASE in the last 168 hours. No results for input(s): AMMONIA in the last 168 hours. Coagulation Profile:  Recent Labs Lab 06/09/15 0458 06/10/15 0350 06/11/15 0432 06/12/15 0420 06/13/15 0500  INR 1.16 1.36 2.14* 1.89* 1.77*   CBC:  Recent Labs Lab 06/09/15 1617 06/10/15 0350 06/11/15 0432 06/12/15 0420 06/12/15 2058 06/13/15 0500  WBC 4.4 4.4 5.9 3.8*  --  4.8  HGB 8.7* 7.6* 7.0* 5.8* 8.0* 8.9*  HCT 27.4* 24.3* 21.2* 18.8* 25.2* 27.9*  MCV 81.5 81.0 79.7 82.5  --  83.0  PLT 71* 83* 72* 70*  --  82*   Cardiac Enzymes: No results for input(s): CKTOTAL, CKMB, CKMBINDEX, TROPONINI in the last 168 hours. BNP: Invalid input(s): POCBNP CBG:  Recent Labs Lab 06/11/15 2108 06/12/15 1309 06/12/15 1620 06/12/15 2044 06/13/15 0732  GLUCAP 115* 119* 123* 110* 153*   HbA1C: No results for input(s): HGBA1C in the last 72 hours. Urine analysis:    Component Value Date/Time   COLORURINE YELLOW 06/01/2015 [place] 06/01/2015 1540   LABSPEC 1.008 06/01/2015 1540   PHURINE 6.0 06/01/2015 1540  GLUCOSEU NEGATIVE 06/01/2015 1540   HGBUR TRACE* 06/01/2015 1540   [place] 06/01/2015 1540   KETONESUR NEGATIVE 06/01/2015 1540   PROTEINUR 30* 06/01/2015 1540   UROBILINOGEN 1.0 01/16/2012 1902   NITRITE NEGATIVE 06/01/2015 1540   LEUKOCYTESUR NEGATIVE 06/01/2015 1540   Sepsis Labs: @LABRCNTIP (procalcitonin:4,lacticidven:4) ) Recent Results (from the past 240 hour(s))  Surgical PCR screen     Status: None   Collection Time: 06/08/15  9:58 AM  Result Value Ref Range Status   MRSA, PCR NEGATIVE NEGATIVE Final   Staphylococcus aureus NEGATIVE NEGATIVE Final     Comment:        The Xpert SA Assay (FDA approved for NASAL specimens in patients over 4 years of age), is one component of a comprehensive surveillance program.  Test performance has been validated by [facility] for patients greater than or equal to 60 year old. It is not intended to diagnose infection nor to guide or monitor treatment.      Scheduled Meds: . sodium chloride   Intravenous Once  . darbepoetin (ARANESP) injection - DIALYSIS  100 mcg Intravenous Q Tue-HD  . feeding supplement (ENSURE ENLIVE)  237 mL Oral BID BM  . insulin aspart  0-15 Units Subcutaneous TID WC  . insulin aspart  0-5 Units Subcutaneous QHS  . multivitamin  1 tablet Oral QHS  . ondansetron  4 mg Oral TID  . pantoprazole  40 mg Oral Daily  . sodium chloride flush  3 mL Intravenous Q12H  . THROMBI-PAD  1 each Topical Once   Continuous Infusions: . sodium chloride 10 mL/hr at 06/09/15 7035    Procedures/Studies: Ct Head Wo Contrast  06/12/2015  CLINICAL DATA:  Confusion. Tearful. Undergoing treatment for multiple myeloma. Altered mental status. EXAM: CT HEAD WITHOUT CONTRAST TECHNIQUE: Contiguous axial images were obtained from the base of the skull through the vertex without intravenous contrast. COMPARISON:  None. FINDINGS: No acute cortical infarct, hemorrhage, or mass lesion is present. The ventricles are of normal size. No significant extra-axial fluid collection is evident. The paranasal sinuses and mastoid air cells are clear. The calvarium is intact. The globes and orbits are intact. No significant extracranial soft tissue lesion is present. IMPRESSION: Negative CT of the head. Electronically Signed   By: San Morelle M.D.   On: 06/12/2015 14:19   US Renal  06/01/2015  CLINICAL DATA:  Acute kidney injury, diabetes mellitus, hypertension and EXAM: RENAL / URINARY TRACT ULTRASOUND COMPLETE COMPARISON:  Abdominal ultrasound 03/13/2015 FINDINGS: Right Kidney: Length: 11.8 cm. Normal cortical  thickness. Increased cortical echogenicity. No hydronephrosis or shadowing calcification. Two cysts identified at mid RIGHT kidney 1.6 x 1.4 x 1.5 cm and at the mid inferior pole 2.9 x 2.5 x 2.4 cm, smaller lesion demonstrating scattered internal echogenicity/complication. Left Kidney: Length: 13.9 cm. Normal cortical thickness. Increased cortical echogenicity. No mass, hydronephrosis or shadowing calcification. Bladder: Partially distended, unremarkable. IMPRESSION: Medical renal disease changes of both kidneys. 2 RIGHT renal cysts, larger 2.9 cm greatest size simple in character with smaller lesion 1.6 cm greatest size complicated by scattered internal echogenicity. Either followup ultrasound in 4-6 months to demonstrate stability or characterization by followup non emergent MRI after discharge when patient can optimally cooperate with MR imaging recommended to exclude cystic renal neoplasm. Electronically Signed   By: Lavonia Dana M.D.   On: 06/01/2015 13:46   Ir Fluoro Guide Cv Line Right  06/05/2015  INDICATION: Acute kidney injury, multiple myeloma, renal failure EXAM: ULTRASOUND GUIDANCE FOR VASCULAR ACCESS  RIGHT INTERNAL JUGULAR PERMANENT HEMODIALYSIS CATHETER Date:  5/16/20175/16/2017 3:53 pm Radiologist:  M. Daryll Brod, MD Guidance:  Ultrasound and fluoroscopic FLUOROSCOPY TIME:  Fluoroscopy Time:  30 seconds (2 mGy). MEDICATIONS: 1% lidocaine locally, 2 g Ancef administered within 1 hour of the procedure ANESTHESIA/SEDATION: Versed 0.5 mg IV; Fentanyl 25 mcg IV; Moderate Sedation Time:  15 minutes The patient was continuously monitored during the procedure by the interventional radiology nurse under my direct supervision. CONTRAST:  None COMPLICATIONS: None immediate. PROCEDURE: Informed consent was obtained from the patient following explanation of the procedure, risks, benefits and alternatives. The patient understands, agrees and consents for the procedure. All questions were addressed. A time out  was performed. Maximal barrier sterile technique utilized including caps, mask, sterile gowns, sterile gloves, large sterile drape, hand hygiene, and 2% chlorhexidine scrub. Under sterile conditions and local anesthesia, right internal jugular micropuncture venous access was performed with ultrasound. Images were obtained for documentation of the patent right internal jugular vein. A guide wire was inserted followed by a transitional dilator. Next, a 0.035 guidewire was advanced into the IVC with a 5-French catheter. Measurements were obtained from the right venotomy site to the proximal right atrium. In the right infraclavicular chest, a subcutaneous tunnel was created under sterile conditions and local anesthesia. 1% lidocaine with epinephrine was utilized for this. The 19 cm tip to cuff palindrome catheter was tunneled subcutaneously to the venotomy site and inserted into the SVC/RA junction through a valved peel-away sheath. Position was confirmed with fluoroscopy. Images were obtained for documentation. Blood was aspirated from the catheter followed by saline and heparin flushes. The appropriate volume and strength of heparin was instilled in each lumen. Caps were applied. The catheter was secured at the tunnel site with Gelfoam and a pursestring suture. The venotomy site was closed with subcuticular Vicryl suture. Dermabond was applied to the small right neck incision. A dry sterile dressing was applied. The catheter is ready for use. No immediate complications. IMPRESSION: Ultrasound and fluoroscopically guided right internal jugular tunneled hemodialysis catheter (19 cm tip to cuff palindrome catheter). Electronically Signed   By: Jerilynn Mages.  Shick M.D.   On: 06/05/2015 16:34   Ir US Guide Vasc Access Right  06/05/2015  INDICATION: Acute kidney injury, multiple myeloma, renal failure EXAM: ULTRASOUND GUIDANCE FOR VASCULAR ACCESS RIGHT INTERNAL JUGULAR PERMANENT HEMODIALYSIS CATHETER Date:  5/16/20175/16/2017 3:53  pm Radiologist:  M. Daryll Brod, MD Guidance:  Ultrasound and fluoroscopic FLUOROSCOPY TIME:  Fluoroscopy Time:  30 seconds (2 mGy). MEDICATIONS: 1% lidocaine locally, 2 g Ancef administered within 1 hour of the procedure ANESTHESIA/SEDATION: Versed 0.5 mg IV; Fentanyl 25 mcg IV; Moderate Sedation Time:  15 minutes The patient was continuously monitored during the procedure by the interventional radiology nurse under my direct supervision. CONTRAST:  None COMPLICATIONS: None immediate. PROCEDURE: Informed consent was obtained from the patient following explanation of the procedure, risks, benefits and alternatives. The patient understands, agrees and consents for the procedure. All questions were addressed. A time out was performed. Maximal barrier sterile technique utilized including caps, mask, sterile gowns, sterile gloves, large sterile drape, hand hygiene, and 2% chlorhexidine scrub. Under sterile conditions and local anesthesia, right internal jugular micropuncture venous access was performed with ultrasound. Images were obtained for documentation of the patent right internal jugular vein. A guide wire was inserted followed by a transitional dilator. Next, a 0.035 guidewire was advanced into the IVC with a 5-French catheter. Measurements were obtained from the right venotomy site to the proximal right  atrium. In the right infraclavicular chest, a subcutaneous tunnel was created under sterile conditions and local anesthesia. 1% lidocaine with epinephrine was utilized for this. The 19 cm tip to cuff palindrome catheter was tunneled subcutaneously to the venotomy site and inserted into the SVC/RA junction through a valved peel-away sheath. Position was confirmed with fluoroscopy. Images were obtained for documentation. Blood was aspirated from the catheter followed by saline and heparin flushes. The appropriate volume and strength of heparin was instilled in each lumen. Caps were applied. The catheter was  secured at the tunnel site with Gelfoam and a pursestring suture. The venotomy site was closed with subcuticular Vicryl suture. Dermabond was applied to the small right neck incision. A dry sterile dressing was applied. The catheter is ready for use. No immediate complications. IMPRESSION: Ultrasound and fluoroscopically guided right internal jugular tunneled hemodialysis catheter (19 cm tip to cuff palindrome catheter). Electronically Signed   By: Jerilynn Mages.  Shick M.D.   On: 06/05/2015 16:34   Dg Chest Port 1 View  06/12/2015  CLINICAL DATA:  One day of fever, history of multiple myeloma, bone marrow transplant with complication, dialysis dependent renal failure. EXAM: PORTABLE CHEST 1 VIEW COMPARISON:  No recent chest x-ray in PACs comparison is made to a chest x-ray of November 21, 2011 FINDINGS: The lungs are adequately inflated. There is no focal infiltrate. There is no pleural effusion or pneumothorax. A dialysis catheter is in place via the right internal jugular approach with the tip projecting over the midportion of the SVC. A Port-A-Cath appliance is present with its tip projecting over the midportion of the SVC. The heart is normal in size. The pulmonary vascularity is not engorged. The bony thorax exhibits no acute abnormality. The patient has undergone previous plate and screw fixation for proximal humeral fracture. IMPRESSION: There is no evidence of pneumonia. No acute cardiopulmonary abnormality is observed. Electronically Signed   By: Bessie Livingood  [place] M.D.   On: 06/12/2015 16:50   Dg Abd Portable 1v  06/09/2015  CLINICAL DATA:  70 year old with acute nausea and vomiting. EXAM: PORTABLE ABDOMEN - 1 VIEW COMPARISON:  None. FINDINGS: Bowel gas pattern unremarkable without evidence of obstruction or significant ileus. Expected stool burden in the colon. Phleboliths low in the left side of the pelvis. No visible opaque urinary tract calculi. Degenerative changes involving the lumbar spine and sacroiliac  joints. IMPRESSION: No acute abdominal abnormality. Electronically Signed   By: Evangeline Dakin M.D.   On: 06/09/2015 22:23    Quincy Prisco, DO  [facility] Pager [phone]  If 7PM-7AM, please contact night-coverage [email or web address] Password [facility] 06/13/2015, 8:57 AM   LOS: 12 days

## 2015-06-14 ENCOUNTER — Inpatient Hospital Stay (HOSPITAL_COMMUNITY): Payer: Medicare Other

## 2015-06-14 DIAGNOSIS — R079 Chest pain, unspecified: Secondary | ICD-10-CM | POA: Insufficient documentation

## 2015-06-14 DIAGNOSIS — R072 Precordial pain: Secondary | ICD-10-CM

## 2015-06-14 LAB — RENAL FUNCTION PANEL
Albumin: 2.5 g/dL — ABNORMAL LOW (ref 3.5–5.0)
Anion gap: 17 — ABNORMAL HIGH (ref 5–15)
BUN: 57 mg/dL — ABNORMAL HIGH (ref 6–20)
CO2: 23 mmol/L (ref 22–32)
Calcium: 8.7 mg/dL — ABNORMAL LOW (ref 8.9–10.3)
Chloride: 97 mmol/L — ABNORMAL LOW (ref 101–111)
Creatinine, Ser: 8.06 mg/dL — ABNORMAL HIGH (ref 0.61–1.24)
GFR calc Af Amer: 7 mL/min — ABNORMAL LOW (ref >60)
GFR calc non Af Amer: 6 mL/min — ABNORMAL LOW (ref >60)
Glucose, Bld: 181 mg/dL — ABNORMAL HIGH (ref 65–99)
Phosphorus: 6.3 mg/dL — ABNORMAL HIGH (ref 2.5–4.6)
Potassium: 3.8 mmol/L (ref 3.5–5.1)
Sodium: 137 mmol/L (ref 135–145)

## 2015-06-14 LAB — CBC
HCT: 27.3 % — ABNORMAL LOW (ref 39.0–52.0)
HCT: 28.6 % — ABNORMAL LOW (ref 39.0–52.0)
HEMOGLOBIN: 9 g/dL — AB (ref 13.0–17.0)
Hemoglobin: 9.3 g/dL — ABNORMAL LOW (ref 13.0–17.0)
MCH: 26.4 pg (ref 26.0–34.0)
MCH: 26.3 pg (ref 26.0–34.0)
MCHC: 33 g/dL (ref 30.0–36.0)
MCHC: 32.5 g/dL (ref 30.0–36.0)
MCV: 80.1 fL (ref 78.0–100.0)
MCV: 81 fL (ref 78.0–100.0)
PLATELETS: 82 10*3/uL — AB (ref 150–400)
Platelets: 82 10*3/uL — ABNORMAL LOW (ref 150–400)
RBC: 3.41 MIL/uL — AB (ref 4.22–5.81)
RBC: 3.53 MIL/uL — ABNORMAL LOW (ref 4.22–5.81)
RDW: 15.7 % — ABNORMAL HIGH (ref 11.5–15.5)
RDW: 15.9 % — ABNORMAL HIGH (ref 11.5–15.5)
WBC: 3.6 10*3/uL — AB (ref 4.0–10.5)
WBC: 4.3 10*3/uL (ref 4.0–10.5)

## 2015-06-14 LAB — PROTIME-INR
INR: 1.47 (ref 0.00–1.49)
Prothrombin Time: 17.9 seconds — ABNORMAL HIGH (ref 11.6–15.2)

## 2015-06-14 LAB — TROPONIN I

## 2015-06-14 LAB — GLUCOSE, CAPILLARY
Glucose-Capillary: 148 mg/dL — ABNORMAL HIGH (ref 65–99)
Glucose-Capillary: 225 mg/dL — ABNORMAL HIGH (ref 65–99)
Glucose-Capillary: 150 mg/dL — ABNORMAL HIGH (ref 65–99)

## 2015-06-14 MED ORDER — PANTOPRAZOLE SODIUM 40 MG PO TBEC
40.0000 mg | DELAYED_RELEASE_TABLET | Freq: Two times a day (BID) | ORAL | Status: DC
  Administered 2015-06-14 – 2015-06-15 (×2): 40 mg via ORAL
  Filled 2015-06-14 (×2): qty 1

## 2015-06-14 MED ORDER — VITAMIN B-12 100 MCG PO TABS
500.0000 ug | ORAL_TABLET | Freq: Every day | ORAL | Status: DC
  Administered 2015-06-14 – 2015-06-15 (×2): 500 ug via ORAL
  Filled 2015-06-14 (×2): qty 5

## 2015-06-14 MED ORDER — SODIUM CHLORIDE 0.9 % IV SOLN: 100.0000 mL | INTRAVENOUS | Status: DC | PRN

## 2015-06-14 MED ORDER — LIDOCAINE-PRILOCAINE 2.5-2.5 % EX CREA: 1.0000 "application " | TOPICAL_CREAM | CUTANEOUS | Status: DC | PRN

## 2015-06-14 MED ORDER — TECHNETIUM TO 99M ALBUMIN AGGREGATED
4.0000 | Freq: Once | INTRAVENOUS | Status: AC | PRN
  Administered 2015-06-14: 4 via INTRAVENOUS

## 2015-06-14 MED ORDER — CHLORPROMAZINE HCL 25 MG PO TABS
25.0000 mg | ORAL_TABLET | Freq: Three times a day (TID) | ORAL | Status: DC
  Administered 2015-06-14 – 2015-06-15 (×4): 25 mg via ORAL
  Filled 2015-06-14 (×6): qty 1

## 2015-06-14 MED ORDER — ALTEPLASE 2 MG IJ SOLR: 2.0000 mg | Freq: Once | INTRAMUSCULAR | Status: DC | PRN

## 2015-06-14 MED ORDER — METOCLOPRAMIDE HCL 5 MG PO TABS
5.0000 mg | ORAL_TABLET | Freq: Three times a day (TID) | ORAL | Status: DC
  Administered 2015-06-15 (×2): 5 mg via ORAL
  Filled 2015-06-14 (×3): qty 1

## 2015-06-14 MED ORDER — TECHNETIUM TC 99M DIETHYLENETRIAME-PENTAACETIC ACID: 30.0000 | Freq: Once | INTRAVENOUS | Status: DC | PRN

## 2015-06-14 MED ORDER — PENTAFLUOROPROP-TETRAFLUOROETH EX AERO: 1.0000 "application " | INHALATION_SPRAY | CUTANEOUS | Status: DC | PRN

## 2015-06-14 MED ORDER — HEPARIN SODIUM (PORCINE) 1000 UNIT/ML DIALYSIS
1000.0000 [IU] | INTRAMUSCULAR | Status: DC | PRN
Start: 2015-06-14 — End: 2015-06-14

## 2015-06-14 MED ORDER — LIDOCAINE HCL (PF) 1 % IJ SOLN: 5.0000 mL | INTRAMUSCULAR | Status: DC | PRN

## 2015-06-14 NOTE — Consult Note (Signed)
[facility] Consult: 3:59 PM 06/14/2015  LOS: 13 days    Referring Provider: Dr Carles Collet.  Primary Care Physician:  [name] Primary Gastroenterologist:  Dr. Fuller Plan    Reason for Consultation:  CG emesis.     HPI: Eddie Thomas is a 70 y.o. male.  PMH multiple myeloma dx 20, refractory to multiple regimens but currently undergoing treatment with Velcade/Daratumumab.  CKD, htn, DM 2. Hx DVT and PE 2016. Thrombocytopenia.  Home meds include Xarelto, BID Protonix and had completed a course of Diflucan.   04/02/15 esophagram.   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 Differential diagnosis includes benign stricture and Barrett's esophagus, with esophageal carcinoma considered less likely. Endoscopy is recommended for further evaluation.   05/16/15 EGD for dysphagia and abnormal UGI series.  Dilation of benign esoph stricture, candida esophagitis and small HH.  01/2012 EGD for n/v, wt loss.  Severe esophagitis.  [facility] 06/2011 Colonoscopy.  Dr Virgel Bouquet.  Colon polyp.   Admitted 1 week ago with weakness, fatigue, anorexia, oliguria.  Poor po intake partly due to altered taste from chemo where food tastes bad.   Labs with AKI. Started on HD. Last dialysis was today.   Epo started for recurrent/ongoing anemia, s/p PRBC transfusion x 2.  Xarelto discontinued.    Pt says swallow improved after EGD/dilation but still has constant hiccups, even interrupting his sleep.  Also has ongoing, intermittent n/v with CG but no blood. Stools formed, brown ~ every other day but currently his last BM was 4 to 5 days ago.  No blood PR or melenic stools.  No abd pain.  + weight loss, 30 # in last month if scales are accurate. .      Past Medical History  Diagnosis Date  . Diabetes mellitus ([place]) 07/03/2011  . Hypertension 07/03/2011  . Hyperlipidemia 07/03/2011  . Colon polyps 2012  . Gastric ulcer   . Bulging lumbar disc   . Encounter for antineoplastic chemotherapy 03/05/2015  . Anemia   . Blood transfusion without reported diagnosis 2015  . Multiple myeloma 2011    currently undergoing chemo  . Cataract     Past Surgical History  Procedure Laterality Date  . Limbal stem cell transplant    . Humerus fracture surgery  2011    right  . 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  . Cataract extraction w/ intraocular lens implant Bilateral left 3/17, right 4/17  . Av fistula placement Left 06/08/2015    Procedure: LEFT UPPER ARM ARTERIOVENOUS (AV) FISTULA CREATION;  Surgeon: Mal Misty, MD;  Location: [place];  Service: Vascular;  Laterality: Left;    Prior to Admission medications   Medication Sig Start Date End Date Taking? Authorizing Provider  Cholecalciferol (VITAMIN D-3) 5000 UNITS TABS Take 1 tablet by mouth daily.   Yes Historical Provider, MD  fluconazole (DIFLUCAN) 100 MG tablet Take 1 tablet (100 mg total) by mouth daily. 05/30/15  Yes Curt Bears, MD  gabapentin (NEURONTIN) 300 MG capsule Take 300 mg by mouth 3 (three) times daily as needed (pain). Reported on 04/25/2015   Yes Historical Provider, MD  lidocaine-prilocaine (EMLA) cream Apply 1 application topically as needed. Apply one hour before use and cover with plastic wrap. 10/17/14  Yes Adrena E Johnson, PA-C  lisinopril-hydrochlorothiazide (PRINZIDE,ZESTORETIC) 20-12.5 MG tablet Take 1 tablet by mouth daily.    Yes Historical Provider, MD  oxyCODONE-acetaminophen (PERCOCET/ROXICET) 5-325 MG tablet Take 1 tablet by mouth every 6 (six) hours as needed for severe pain. 01/24/15  Yes Curt Bears, MD  pantoprazole (PROTONIX) 40 MG tablet TAKE ONE TABLET BY MOUTH TWICE DAILY 02/02/15  Yes Ladene Artist, MD  prochlorperazine  (COMPAZINE) 10 MG tablet Take 10 mg by mouth every 6 (six) hours as needed for nausea or vomiting. Reported on 05/14/2015   Yes Historical Provider, MD  simvastatin (ZOCOR) 10 MG tablet Take 10 mg by mouth at bedtime.     Yes Historical Provider, MD  temazepam (RESTORIL) 15 MG capsule Take 15 mg by mouth at bedtime as needed for sleep. Reported on 01/16/2015   Yes Historical Provider, MD  valACYclovir (VALTREX) 500 MG tablet TAKE ONE CAPLET BY MOUTH ONCE DAILY 01/24/15  Yes Curt Bears, MD  VIAGRA 50 MG tablet Take 50 mg by mouth daily as needed for erectile dysfunction.  02/23/13  Yes Historical Provider, MD  XARELTO 20 MG TABS tablet TAKE ONE TABLET BY MOUTH ONCE DAILY WITH  SUPPER 05/04/15  Yes Curt Bears, MD  dexamethasone (DECADRON) 4 MG tablet 5 tab po on days 1,2,4,5,8,9,11 and 12 every 3 weeks, start first day of chemo. Patient not taking: Reported on 06/01/2015 04/18/15   Curt Bears, MD  montelukast (SINGULAIR) 10 MG tablet Take 10 mg by mouth. 04/18/15 04/17/16  Historical Provider, MD  pioglitazone (ACTOS) 15 MG tablet Take 15 mg by mouth daily. Reported on 05/16/2015 08/19/12   Historical Provider, MD    Scheduled Meds: . sodium chloride   Intravenous Once  . calcitRIOL  0.25 mcg Oral Q T,Th,Sa-HD  . chlorproMAZINE  25 mg Oral TID  . darbepoetin (ARANESP) injection - DIALYSIS  100 mcg Intravenous Q Tue-HD  . feeding supplement (ENSURE ENLIVE)  237 mL Oral BID BM  . insulin aspart  0-15 Units Subcutaneous TID WC  . insulin aspart  0-5 Units Subcutaneous QHS  . multivitamin  1 tablet Oral QHS  . ondansetron  4 mg Oral TID  . pantoprazole  40 mg Oral Daily  . sodium chloride flush  3 mL Intravenous Q12H  . THROMBI-PAD  1 each Topical Once  . vitamin B-12  500 mcg Oral Daily   Infusions: . sodium chloride 10 mL/hr at 06/09/15 0552   PRN Meds: acetaminophen **OR** acetaminophen, promethazine, sodium chloride flush, technetium TC 7M diethylenetriame-pentaacetic  acid   Allergies as of 06/01/2015  . (No Known Allergies)    Family History  Problem Relation Age of Onset  . Diabetes Mother   . Hyperlipidemia Mother   . Diabetes Sister   . Diabetes Brother   . Colon cancer Maternal Uncle 18    Social History   Social History  . Marital Status: Married    Spouse Name: N/A  . Number of Children: 2  . Years of Education: N/A   Occupational History  . retired    Social History Main Topics  . Smoking status: Never Smoker   . Smokeless tobacco: Never Used  . Alcohol  Use: No  . Drug Use: No  . Sexual Activity: Not on file   Other Topics Concern  . Not on file   Social History Narrative    REVIEW OF SYSTEMS: Constitutional:  Weakness, fatigue.  + weight loss ~ 30 # in last month ENT:  No nose bleeds Pulm:  No SOB or cough CV:  No palpitations, no LE edema.  GU:  No hematuria, no frequency GI:  Per HPI.   Heme:  No unusual bleeding or bruising   Transfusions:  Per HPI and on 04/30/15, 07/2012 Neuro:  No headaches, no peripheral tingling or numbness Derm:  No itching, no rash or sores.  Endocrine:  No sweats or chills.  No polyuria or dysuria Immunization:  Not queried.   Travel:  None beyond local counties in last few months.    PHYSICAL EXAM: Vital signs in last 24 hours: Filed Vitals:   06/14/15 1046 06/14/15 1100  BP: 133/60 122/56  Pulse: 93 100  Temp:    Resp:     Wt Readings from Last 3 Encounters:  06/14/15 62.9 kg (138 lb 10.7 oz)  05/16/15 73.483 kg (162 lb)  05/14/15 73.165 kg (161 lb 4.8 oz)    General: pleasant, looks tired and somewhat ill.   Head:  No swelling or asymmetry  Eyes:  No icterus or pallor.  EOMI Ears:  Not HOH  Nose:  No discharge Mouth:  Clear, moist oral MM Neck:  No mass, TMG, or JVD Lungs:  Non-labored breathing.  Hiccupping.   Heart: RRR.  No MRG Abdomen:  Soft, NT, active BS.  No mass or HSM.   Rectal: deferred   Musc/Skeltl: no gross contracture deformities or  swelling Extremities:  No CCE  Neurologic:  Oriented x 3.  Alert.  No gross deficits.  Unable to perform full exam with pt on nuc scanner table  Skin:  No sores visible on arms or face   Psych:  Pleasant, cooperative, calm.   Intake/Output from previous day: 05/24 0701 - 05/25 0700 In: 310 [P.O.:300; I.V.:10] Out: 2 [Urine:1; Emesis/NG output:1] Intake/Output this shift: Total I/O In: 180 [P.O.:180] Out: 1087 [Other:1087]  LAB RESULTS:  Recent Labs  06/13/15 0500 06/14/15 0442 06/14/15 0649  WBC 4.8 3.6* 4.3  HGB 8.9* 9.0* 9.3*  HCT 27.9* 27.3* 28.6*  PLT 82* 82* 82*   BMET Lab Results  Component Value Date   NA 137 06/14/2015   NA 137 06/13/2015   NA 140 06/12/2015   K 3.8 06/14/2015   K 4.1 06/13/2015   K 4.3 06/12/2015   CL 97* 06/14/2015   CL 97* 06/13/2015   CL 96* 06/12/2015   CO2 23 06/14/2015   CO2 24 06/13/2015   CO2 26 06/12/2015   GLUCOSE 181* 06/14/2015   GLUCOSE 139* 06/13/2015   GLUCOSE 161* 06/12/2015   BUN 57* 06/14/2015   BUN 35* 06/13/2015   BUN 76* 06/12/2015   CREATININE 8.06* 06/14/2015   CREATININE 5.50* 06/13/2015   CREATININE 10.04* 06/12/2015   CALCIUM 8.7* 06/14/2015   CALCIUM 8.0* 06/13/2015   CALCIUM 8.7* 06/12/2015   LFT  Recent Labs  06/12/15 0832 06/14/15 0649  ALBUMIN 2.3* 2.5*   PT/INR Lab Results  Component Value Date   INR 1.47 06/14/2015   INR 1.77* 06/13/2015   INR 1.89* 06/12/2015   Hepatitis Panel No results for input(s): HEPBSAG, HCVAB, HEPAIGM, HEPBIGM in the last 72 hours. C-Diff No components found for: CDIFF Lipase     Component  Value Date/Time   LIPASE 38 12/31/2011 1420    Drugs of Abuse  No results found for: LABOPIA, COCAINSCRNUR, LABBENZ, AMPHETMU, THCU, LABBARB   RADIOLOGY STUDIES: Dg Chest Port 1 View  06/12/2015  CLINICAL DATA:  One day of fever, history of multiple myeloma, bone marrow transplant with complication, dialysis dependent renal failure. EXAM: PORTABLE CHEST 1 VIEW  COMPARISON:  No recent chest x-ray in PACs comparison is made to a chest x-ray of November 21, 2011 FINDINGS: The lungs are adequately inflated. There is no focal infiltrate. There is no pleural effusion or pneumothorax. A dialysis catheter is in place via the right internal jugular approach with the tip projecting over the midportion of the SVC. A Port-A-Cath appliance is present with its tip projecting over the midportion of the SVC. The heart is normal in size. The pulmonary vascularity is not engorged. The bony thorax exhibits no acute abnormality. The patient has undergone previous plate and screw fixation for proximal humeral fracture. IMPRESSION: There is no evidence of pneumonia. No acute cardiopulmonary abnormality is observed. Electronically Signed   By: David  Martinique M.D.   On: 06/12/2015 16:50    ENDOSCOPIC STUDIES: Per HPI  IMPRESSION:   *  CG emesis.  Stable Hgb since transfusion on 5/23.  .  05/16/15 EGD for dysphagia: candida esophagitis, dilation of esophageal stricture, small HH.  Completed course of Diflucan. On once daily Protonix.   Swallowing improved but persistent/intractable hiccups  *  Hiccups.  Meds tried so far: ativan, baclofen, thorazine.  All discontinued for different reasons.    *  AKI, now with ESRD and initiated on HD.  AKI felt due to MM or drug induced nephropathy.   *  Anemia.  Due to MM and kidney dx    Thrombocytopenia.      Pancytopenia.  *  Multiple myeloma. Not clear from notes if disease is progressive but hx of being refractory to chemo regimens.      PLAN:     *  Increase to BID PPI.  Trial low dose, TID, oral Reglan.    Azucena Freed  06/14/2015, 3:59 PM Pager: [phone]     ________________________________________________________________________  Velora Heckler GI MD note:  I personally examined the patient, reviewed the data and agree with the assessment and plan described above. Still with hiccups this AM, sitting down for a regular  breakfast.  No sign of significant bleeding and he just had EGD about 5 weeks ago; no need to repeat that now.  Agree with increasing to twice daily PPI and trial of low dose reglan.   Please call with any further questions or concerns.   Owens Loffler, MD [facility] Pager [phone]

## 2015-06-14 NOTE — Progress Notes (Signed)
[facility] Progress Note  Assessment/Plan: 1. Progressive renal failure over last 3 mos in setting of advanced myeloma. 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 VVS has placed [facility] and AVF on 5/19. Set up OP HD TTS [employer], 2nd shift. Next HD today - if goes home will need to make sure can start HD on Saturday at [facility]- can usually do if pt can go to unit on Friday to sign papers- he said he could do  2. Bleeding TDC site resolved- IV hep stopped, agree not good candidate for anticoag at this time- no more obvious bleeding - hgb stable 3. BP/volume - no meds, bp's ok- titrating edw - no overt volume 4. 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 With history of cancer and DVT, ESA started 5/23- Fe studies pre transfusion 34% sat ferritin 1822- given transfusion Fe load, no indication for additional Fe now; transfused total of 2 units PRBC; Hgb up to 9.3  today  5. Multiple myeloma - refractory to mult regimens and sp SCT 2012 6. 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 As above, not good anticoag candidate d/t bleeding complication 7. Bones- phos 6.3 on no binders- iPTH 5/23 397 - start low dose calcitriol - will start phoslo as well 8. Thrombocytopenia Plts 82 stable 9. Anxiety/tearfulness -5/23 - resolved - ? Reaction to meds or precursor to fever spike (favor this) 10 Fever spike 5/23 101.5 -99.1 this am - has TDC - CXR neg, WBC unchanged Blood cultures drawn- no abtx given  Asymptomatic except some N, V, anorexia - no further temp spikes 11 Hiccups trying thorazine   Corliss Parish, MD 06/14/2015     Subjective:   Hiccups resumed. Seen on HD- tolerating well  Objective Filed Vitals:   06/14/15 0730 06/14/15 0759 06/14/15 0829 06/14/15 0857  BP: 160/77 160/79 155/79 153/74  Pulse: 82 84 81 87  Temp:      TempSrc:      Resp:      Height:      Weight:      SpO2:       Physical Exam General: persistent hiccups, calm able to express self, oriented x  3 Heart: RRR Lungs: no rales Abdomen: soft NT Extremities: no LE edema Dialysis Access:  Left upper AVF + bruit and right IJ   Dialysis Orders: new start for out pt [facility]TS [facility]  Additional Objective Labs: Basic Metabolic Panel:  Recent Labs Lab 06/09/15 0756 06/12/15 0832 06/13/15 0500 06/14/15 0649  NA 140 140 137 137  K 3.4* 4.3 4.1 3.8  CL 98* 96* 97* 97*  CO2 _0 GLUCOSE 132* 161* 139* 181*  BUN 19 76* 35* 57*  CREATININE 5.23* 10.04* 5.50* 8.06*  CALCIUM 8.6* 8.7* 8.0* 8.7*  PHOS 3.6 5.4*  --  6.3*   Liver Function Tests:  Recent Labs Lab 06/09/15 0756 06/12/15 0832 06/14/15 0649  ALBUMIN 2.3* 2.3* 2.5*   CBC:  Recent Labs Lab 06/11/15 0432 06/12/15 0420  06/13/15 0500 06/14/15 0442 06/14/15 0649  WBC 5.9 3.8*  --  4.8 3.6* 4.3  HGB 7.0* 5.8*  < > 8.9* 9.0* 9.3*  HCT 21.2* 18.8*  < > 27.9* 27.3* 28.6*  MCV 79.7 82.5  --  83.0 80.1 81.0  PLT 72* 70*  --  82* 82* 82*  < > = values in this interval not displayed. CBG:  Recent Labs Lab 06/12/15 2044 06/13/15 0732 06/13/15 1130 06/13/15 1649  06/13/15 2135  GLUCAP 110* 153* 178* 147* 102*   Iron Studies:   Recent Labs  06/12/15 0910  IRON 49  TIBC 146*  FERRITIN 1822*    Studies/Results: Ct Head Wo Contrast  06/12/2015  CLINICAL DATA:  Confusion. Tearful. Undergoing treatment for multiple myeloma. Altered mental status. EXAM: CT HEAD WITHOUT CONTRAST TECHNIQUE: Contiguous axial images were obtained from the base of the skull through the vertex without intravenous contrast. COMPARISON:  None. FINDINGS: No acute cortical infarct, hemorrhage, or mass lesion is present. The ventricles are of normal size. No significant extra-axial fluid collection is evident. The paranasal sinuses and mastoid air cells are clear. The calvarium is intact. The globes and orbits are intact. No significant extracranial soft tissue lesion is present. IMPRESSION: Negative CT of the head. Electronically Signed   By:  San Morelle M.D.   On: 06/12/2015 14:19   Dg Chest Port 1 View  06/12/2015  CLINICAL DATA:  One day of fever, history of multiple myeloma, bone marrow transplant with complication, dialysis dependent renal failure. EXAM: PORTABLE CHEST 1 VIEW COMPARISON:  No recent chest x-ray in PACs comparison is made to a chest x-ray of November 21, 2011 FINDINGS: The lungs are adequately inflated. There is no focal infiltrate. There is no pleural effusion or pneumothorax. A dialysis catheter is in place via the right internal jugular approach with the tip projecting over the midportion of the SVC. A Port-A-Cath appliance is present with its tip projecting over the midportion of the SVC. The heart is normal in size. The pulmonary vascularity is not engorged. The bony thorax exhibits no acute abnormality. The patient has undergone previous plate and screw fixation for proximal humeral fracture. IMPRESSION: There is no evidence of pneumonia. No acute cardiopulmonary abnormality is observed. Electronically Signed   By: David  Martinique M.D.   On: 06/12/2015 16:50   Medications: . sodium chloride 10 mL/hr at 06/09/15 0552   . sodium chloride   Intravenous Once  . calcitRIOL  0.25 mcg Oral Q T,Th,Sa-HD  . darbepoetin (ARANESP) injection - DIALYSIS  100 mcg Intravenous Q Tue-HD  . feeding supplement (ENSURE ENLIVE)  237 mL Oral BID BM  . insulin aspart  0-15 Units Subcutaneous TID WC  . insulin aspart  0-5 Units Subcutaneous QHS  . multivitamin  1 tablet Oral QHS  . ondansetron  4 mg Oral TID  . pantoprazole  40 mg Oral Daily  . sodium chloride flush  3 mL Intravenous Q12H  . THROMBI-PAD  1 each Topical Once

## 2015-06-14 NOTE — Progress Notes (Signed)
Hemodialysis- Treatment completed without issue. Total UF 1L. Vitals remained stable throughout. Pt continues to have persistent hiccups. Given Thorazine on HD per Dr. Moshe Cipro. Report called to primary RN

## 2015-06-14 NOTE — Progress Notes (Signed)
PROGRESS NOTE  Eddie Thomas MBE:[id number] DOB: 07-Feb-1945 DOA: 06/01/2015 PCP: [name]  Brief History:  PAT SIRES is a 70 y.o. male with medical history significant of multiple myeloma, IgA subtype, currently undergoing treatment with Velcade/Daratumumab at the cancer center, follows Dr. Julien Nordmann of medical oncology, also having a history of chronic kidney disease (with baseline creatinine near 1.6-2.0), hypertension, diabetes mellitus, presented as a transfer from the cancer center for acute renal failure with creatinine of 10. Renal consulted, [facility] & AVF placed by [facility] & HD started. Intractable hiccups- unclear etiology. ABLA complicating chronic anemia > multiple antibodies on type and screen- plan for 2 PRBC's on 5/23. Acute AMS developed on 06/12/15 which was likely multifactorial including medications and possible new infection as pt had a fever up to 101.6.  Since then, his mental status has improved and returned back to baseline.  On 06/14/15, pt c/o of cp and sob.  With his hx of DVT, CTA chest was obtained.  Assessment/Plan: Acute renal failure complicating chronic kidney disease: - Nephrology was consulted. As per nephrology, creatinine started to rise in March-April 2017 up to 1.4-1.8 range with some bumps up to 2.3-2.5. On May 8, creatinine was 3.3. Now admitted with creatinine of 10. 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 Patient's myeloma is refractory to multiple different chemotherapy combinations and do not believe that his chemotherapy or pheresis is going to help his renal function. - Nephrology suggested palliative care consultation, to be pursued by oncology as outpatient. - Patient undergoing hemodialysis per nephrology. Last HD on 5/22. [facility] and AVF placed by [facility]. HD initiated by nephrology. Outpatient TTS dialysis >has been arranged by nephrology.  Acute encephalopathy -developed between 5/22 night and 5/23 AM -likely multifactorial including  infection, meds, worsen anemia -new fever on 06/12/15 of 101.6 -06/12/15 CT brain negative -06/13/15--mental status improving -Discontinue Thorazine, baclofen, opioids, other hypnotics -check ammonia--24  -B12--266 -TSH--0.493, Free T4--1.04 -with B12 in low normal range-->start supplementation  Atypical Chest Pain -developed 06/14/15 -with hx of DVT left leg, myeloma and new tachycardia-->CTA chest -EKG -cycle troponins  Multiple Myeloma: - Follows with Dr Julien Nordmann.  -refractory to multiple chemo regimens and SCT in 2012  Anemia - due to multiple myeloma, transfused 2 units PRBC earlier on 06/04/15. - Hemoglobin has dropped significantly in the last 48 hours from 7.6 > 5.8. Some of this was probably related to bleeding from [facility] site while he was on IV heparin-that has stopped since late Saturday. - Transfuse 2 units of PRBC 5/23 and follow posttransfusion hemoglobin. Blood bank called and stated patient has multiple antibodies related to his myeloma medications but director of blood bank has cleared the available blood for transfusion. Verified same with oncologist who has had similar experience in the past. -Hgb has remained stable since tranfusion  Pancytopenia - Secondary to multiple myeloma and related cancer chemotherapy. - Anemia management as above. Thrombocytopenia stable.  Hyperkalemia - Resolved after dialysis.  NSVT: - Patient had 15 beats of nonsustained V. tach, hypomagnesemia corrected, 2-D echo done 11/2015 with EF 55-60% - Continue with telemetry monitor, would consider low-dose beta blocker if recurrence - May be related to renal failure, electrolyte abnormalities. Repeat 2-D echo: Normal EF.  History of DVT - Xarelto DC'ed given renal function - Discussed with Dr. Julien Nordmann on 5/17 and he had recommended continued anticoagulation. Attempted IV heparin bridging and Coumadin but each time IV heparin was initiated, patient had bleeding from [facility]  site and hence all  anticoagulation was stopped. Not good anticoagulation candidate due to bleeding complications. Discussed with Dr. Julien Nordmann on 5/21.  Essential hypertension - ACEI stopped due to acute renal failure. Controlled.  Intractable hiccups, intermittent nausea and nonbloody emesis - May not be due to uremia since it has persisted despite several hemodialysis. Briefly started on Ativan without significant improvement. Then was changed to baclofen (received 3 doses)-this was stopped on 5/22 due to concern for side effects in renal failure. Started on Thorazine 5/22. All meds stopped due to altered mental status of unclear etiology. -Intermittent hiccups continue since discontinuation of hypnotic medications    DVT prophylaxis: SCDs Code Status: Full Family Communication: No family present Disposition Plan: Possible discharge home 06/15/2015 if afebrile and medically stable   Consultants:   Renal.  Oncology  IR  Vascular surgery  Procedures:   Right IJ HD catheter insertion by interventional radiology on 5/16  Hemodialysis initiated 5/16  2-D echo 06/08/15:        Subjective: Patient complains of chest pain and shortness of breath that developed earlier this morning. Denies any vomiting, diarrhea, abdominal pain. No fevers, chills, headache, neck pain. He has some nausea.  Objective: Filed Vitals:   06/14/15 1026 06/14/15 1036 06/14/15 1046 06/14/15 1100  BP: 122/62 116/65 133/60 122/56  Pulse: 95 94 93 100  Temp:  98.6 F (37 C)    TempSrc:  Oral    Resp:  15    Height:      Weight:  62.9 kg (138 lb 10.7 oz)    SpO2:  100%      Intake/Output Summary (Last 24 hours) at 06/14/15 1308 Last data filed at 06/14/15 1036  Gross per 24 hour  Intake    130 ml  Output   1087 ml  Net   -957 ml   Weight change: 3.7 kg (8 lb 2.5 oz) Exam:   General:  Pt is alert, follows commands appropriately, not in acute distress  HEENT: No icterus, No thrush, No neck mass,  /AT  Cardiovascular: RRR, S1/S2, no rubs, no gallops  Respiratory: Fine bibasilar crackles. No wheezing. Good air movement.  Abdomen: Soft/+BS, non tender, non distended, no guarding  Extremities: 1 + LLE edema, No lymphangitis, No petechiae, No rashes, no synovitis   Data Reviewed: I have personally reviewed following labs and imaging studies Basic Metabolic Panel:  Recent Labs Lab 06/08/15 1209 06/09/15 0756 06/12/15 0832 06/13/15 0500 06/14/15 0649  NA 138 140 140 137 137  K 3.8 3.4* 4.3 4.1 3.8  CL 98* 98* 96* 97* 97*  CO2 28 29 26 24 23   GLUCOSE 199* 132* 161* 139* 181*  BUN 16 19 76* 35* 57*  CREATININE 4.20* 5.23* 10.04* 5.50* 8.06*  CALCIUM 8.4* 8.6* 8.7* 8.0* 8.7*  PHOS  --  3.6 5.4*  --  6.3*   Liver Function Tests:  Recent Labs Lab 06/09/15 0756 06/12/15 0832 06/14/15 0649  ALBUMIN 2.3* 2.3* 2.5*   No results for input(s): LIPASE, AMYLASE in the last 168 hours.  Recent Labs Lab 06/13/15 1135  AMMONIA 24   Coagulation Profile:  Recent Labs Lab 06/10/15 0350 06/11/15 0432 06/12/15 0420 06/13/15 0500 06/14/15 0442  INR 1.36 2.14* 1.89* 1.77* 1.47   CBC:  Recent Labs Lab 06/11/15 0432 06/12/15 0420 06/12/15 2058 06/13/15 0500 06/14/15 0442 06/14/15 0649  WBC 5.9 3.8*  --  4.8 3.6* 4.3  HGB 7.0* 5.8* 8.0* 8.9* 9.0* 9.3*  HCT 21.2* 18.8* 25.2* 27.9*  27.3* 28.6*  MCV 79.7 82.5  --  83.0 80.1 81.0  PLT 72* 70*  --  82* 82* 82*   Cardiac Enzymes: No results for input(s): CKTOTAL, CKMB, CKMBINDEX, TROPONINI in the last 168 hours. BNP: Invalid input(s): POCBNP CBG:  Recent Labs Lab 06/13/15 0732 06/13/15 1130 06/13/15 1649 06/13/15 2135 06/14/15 1109  GLUCAP 153* 178* 147* 102* 148*   HbA1C: No results for input(s): HGBA1C in the last 72 hours. Urine analysis:    Component Value Date/Time   COLORURINE YELLOW 06/01/2015 [place] 06/01/2015 1540   LABSPEC 1.008 06/01/2015 1540   PHURINE 6.0 06/01/2015  1540   GLUCOSEU NEGATIVE 06/01/2015 1540   HGBUR TRACE* 06/01/2015 1540   BILIRUBINUR NEGATIVE 06/01/2015 1540   KETONESUR NEGATIVE 06/01/2015 1540   PROTEINUR 30* 06/01/2015 1540   UROBILINOGEN 1.0 01/16/2012 1902   NITRITE NEGATIVE 06/01/2015 1540   LEUKOCYTESUR NEGATIVE 06/01/2015 1540   Sepsis Labs: @LABRCNTIP (procalcitonin:4,lacticidven:4) ) Recent Results (from the past 240 hour(s))  Surgical PCR screen     Status: None   Collection Time: 06/08/15  9:58 AM  Result Value Ref Range Status   MRSA, PCR NEGATIVE NEGATIVE Final   Staphylococcus aureus NEGATIVE NEGATIVE Final    Comment:        The Xpert SA Assay (FDA approved for NASAL specimens in patients over 67 years of age), is one component of a comprehensive surveillance program.  Test performance has been validated by [facility] for patients greater than or equal to 70 year old. It is not intended to diagnose infection nor to guide or monitor treatment.   Culture, blood (Routine X 2) w Reflex to ID Panel     Status: None (Preliminary result)   Collection Time: 06/12/15  8:58 PM  Result Value Ref Range Status   Specimen Description RIGHT ANTECUBITAL  Final   Special Requests IN BOTH AEROBIC AND ANAEROBIC BOTTLES 5CC  Final   Culture NO GROWTH < 24 HOURS  Final   Report Status PENDING  Incomplete  Culture, blood (Routine X 2) w Reflex to ID Panel     Status: None (Preliminary result)   Collection Time: 06/12/15  9:07 PM  Result Value Ref Range Status   Specimen Description RIGHT ANTECUBITAL  Final   Special Requests IN BOTH AEROBIC AND ANAEROBIC BOTTLES 5CC  Final   Culture NO GROWTH < 24 HOURS  Final   Report Status PENDING  Incomplete     Scheduled Meds: . sodium chloride   Intravenous Once  . calcitRIOL  0.25 mcg Oral Q T,Th,Sa-HD  . chlorproMAZINE  25 mg Oral TID  . darbepoetin (ARANESP) injection - DIALYSIS  100 mcg Intravenous Q Tue-HD  . feeding supplement (ENSURE ENLIVE)  237 mL Oral BID BM  .  insulin aspart  0-15 Units Subcutaneous TID WC  . insulin aspart  0-5 Units Subcutaneous QHS  . multivitamin  1 tablet Oral QHS  . ondansetron  4 mg Oral TID  . pantoprazole  40 mg Oral Daily  . sodium chloride flush  3 mL Intravenous Q12H  . THROMBI-PAD  1 each Topical Once   Continuous Infusions: . sodium chloride 10 mL/hr at 06/09/15 2863    Procedures/Studies: Ct Head Wo Contrast  06/12/2015  CLINICAL DATA:  Confusion. Tearful. Undergoing treatment for multiple myeloma. Altered mental status. EXAM: CT HEAD WITHOUT CONTRAST TECHNIQUE: Contiguous axial images were obtained from the base of the skull through the vertex without intravenous contrast. COMPARISON:  None. FINDINGS:  No acute cortical infarct, hemorrhage, or mass lesion is present. The ventricles are of normal size. No significant extra-axial fluid collection is evident. The paranasal sinuses and mastoid air cells are clear. The calvarium is intact. The globes and orbits are intact. No significant extracranial soft tissue lesion is present. IMPRESSION: Negative CT of the head. Electronically Signed   By: San Morelle M.D.   On: 06/12/2015 14:19   US Renal  06/01/2015  CLINICAL DATA:  Acute kidney injury, diabetes mellitus, hypertension and EXAM: RENAL / URINARY TRACT ULTRASOUND COMPLETE COMPARISON:  Abdominal ultrasound 03/13/2015 FINDINGS: Right Kidney: Length: 11.8 cm. Normal cortical thickness. Increased cortical echogenicity. No hydronephrosis or shadowing calcification. Two cysts identified at mid RIGHT kidney 1.6 x 1.4 x 1.5 cm and at the mid inferior pole 2.9 x 2.5 x 2.4 cm, smaller lesion demonstrating scattered internal echogenicity/complication. Left Kidney: Length: 13.9 cm. Normal cortical thickness. Increased cortical echogenicity. No mass, hydronephrosis or shadowing calcification. Bladder: Partially distended, unremarkable. IMPRESSION: Medical renal disease changes of both kidneys. 2 RIGHT renal cysts, larger 2.9 cm  greatest size simple in character with smaller lesion 1.6 cm greatest size complicated by scattered internal echogenicity. Either followup ultrasound in 4-6 months to demonstrate stability or characterization by followup non emergent MRI after discharge when patient can optimally cooperate with MR imaging recommended to exclude cystic renal neoplasm. Electronically Signed   By: Lavonia Dana M.D.   On: 06/01/2015 13:46   Ir Fluoro Guide Cv Line Right  06/05/2015  INDICATION: Acute kidney injury, multiple myeloma, renal failure EXAM: ULTRASOUND GUIDANCE FOR VASCULAR ACCESS RIGHT INTERNAL JUGULAR PERMANENT HEMODIALYSIS CATHETER Date:  5/16/20175/16/2017 3:53 pm Radiologist:  M. Daryll Brod, MD Guidance:  Ultrasound and fluoroscopic FLUOROSCOPY TIME:  Fluoroscopy Time:  30 seconds (2 mGy). MEDICATIONS: 1% lidocaine locally, 2 g Ancef administered within 1 hour of the procedure ANESTHESIA/SEDATION: Versed 0.5 mg IV; Fentanyl 25 mcg IV; Moderate Sedation Time:  15 minutes The patient was continuously monitored during the procedure by the interventional radiology nurse under my direct supervision. CONTRAST:  None COMPLICATIONS: None immediate. PROCEDURE: Informed consent was obtained from the patient following explanation of the procedure, risks, benefits and alternatives. The patient understands, agrees and consents for the procedure. All questions were addressed. A time out was performed. Maximal barrier sterile technique utilized including caps, mask, sterile gowns, sterile gloves, large sterile drape, hand hygiene, and 2% chlorhexidine scrub. Under sterile conditions and local anesthesia, right internal jugular micropuncture venous access was performed with ultrasound. Images were obtained for documentation of the patent right internal jugular vein. A guide wire was inserted followed by a transitional dilator. Next, a 0.035 guidewire was advanced into the IVC with a 5-French catheter. Measurements were obtained from  the right venotomy site to the proximal right atrium. In the right infraclavicular chest, a subcutaneous tunnel was created under sterile conditions and local anesthesia. 1% lidocaine with epinephrine was utilized for this. The 19 cm tip to cuff palindrome catheter was tunneled subcutaneously to the venotomy site and inserted into the SVC/RA junction through a valved peel-away sheath. Position was confirmed with fluoroscopy. Images were obtained for documentation. Blood was aspirated from the catheter followed by saline and heparin flushes. The appropriate volume and strength of heparin was instilled in each lumen. Caps were applied. The catheter was secured at the tunnel site with Gelfoam and a pursestring suture. The venotomy site was closed with subcuticular Vicryl suture. Dermabond was applied to the small right neck incision. A dry sterile dressing  was applied. The catheter is ready for use. No immediate complications. IMPRESSION: Ultrasound and fluoroscopically guided right internal jugular tunneled hemodialysis catheter (19 cm tip to cuff palindrome catheter). Electronically Signed   By: Jerilynn Mages.  Shick M.D.   On: 06/05/2015 16:34   Ir US Guide Vasc Access Right  06/05/2015  INDICATION: Acute kidney injury, multiple myeloma, renal failure EXAM: ULTRASOUND GUIDANCE FOR VASCULAR ACCESS RIGHT INTERNAL JUGULAR PERMANENT HEMODIALYSIS CATHETER Date:  5/16/20175/16/2017 3:53 pm Radiologist:  M. Daryll Brod, MD Guidance:  Ultrasound and fluoroscopic FLUOROSCOPY TIME:  Fluoroscopy Time:  30 seconds (2 mGy). MEDICATIONS: 1% lidocaine locally, 2 g Ancef administered within 1 hour of the procedure ANESTHESIA/SEDATION: Versed 0.5 mg IV; Fentanyl 25 mcg IV; Moderate Sedation Time:  15 minutes The patient was continuously monitored during the procedure by the interventional radiology nurse under my direct supervision. CONTRAST:  None COMPLICATIONS: None immediate. PROCEDURE: Informed consent was obtained from the patient  following explanation of the procedure, risks, benefits and alternatives. The patient understands, agrees and consents for the procedure. All questions were addressed. A time out was performed. Maximal barrier sterile technique utilized including caps, mask, sterile gowns, sterile gloves, large sterile drape, hand hygiene, and 2% chlorhexidine scrub. Under sterile conditions and local anesthesia, right internal jugular micropuncture venous access was performed with ultrasound. Images were obtained for documentation of the patent right internal jugular vein. A guide wire was inserted followed by a transitional dilator. Next, a 0.035 guidewire was advanced into the IVC with a 5-French catheter. Measurements were obtained from the right venotomy site to the proximal right atrium. In the right infraclavicular chest, a subcutaneous tunnel was created under sterile conditions and local anesthesia. 1% lidocaine with epinephrine was utilized for this. The 19 cm tip to cuff palindrome catheter was tunneled subcutaneously to the venotomy site and inserted into the SVC/RA junction through a valved peel-away sheath. Position was confirmed with fluoroscopy. Images were obtained for documentation. Blood was aspirated from the catheter followed by saline and heparin flushes. The appropriate volume and strength of heparin was instilled in each lumen. Caps were applied. The catheter was secured at the tunnel site with Gelfoam and a pursestring suture. The venotomy site was closed with subcuticular Vicryl suture. Dermabond was applied to the small right neck incision. A dry sterile dressing was applied. The catheter is ready for use. No immediate complications. IMPRESSION: Ultrasound and fluoroscopically guided right internal jugular tunneled hemodialysis catheter (19 cm tip to cuff palindrome catheter). Electronically Signed   By: Jerilynn Mages.  Shick M.D.   On: 06/05/2015 16:34   Dg Chest Port 1 View  06/12/2015  CLINICAL DATA:  One day of  fever, history of multiple myeloma, bone marrow transplant with complication, dialysis dependent renal failure. EXAM: PORTABLE CHEST 1 VIEW COMPARISON:  No recent chest x-ray in PACs comparison is made to a chest x-ray of November 21, 2011 FINDINGS: The lungs are adequately inflated. There is no focal infiltrate. There is no pleural effusion or pneumothorax. A dialysis catheter is in place via the right internal jugular approach with the tip projecting over the midportion of the SVC. A Port-A-Cath appliance is present with its tip projecting over the midportion of the SVC. The heart is normal in size. The pulmonary vascularity is not engorged. The bony thorax exhibits no acute abnormality. The patient has undergone previous plate and screw fixation for proximal humeral fracture. IMPRESSION: There is no evidence of pneumonia. No acute cardiopulmonary abnormality is observed. Electronically Signed   By: Shanon Brow  [place] M.D.   On: 06/12/2015 16:50   Dg Abd Portable 1v  06/09/2015  CLINICAL DATA:  70 year old with acute nausea and vomiting. EXAM: PORTABLE ABDOMEN - 1 VIEW COMPARISON:  None. FINDINGS: Bowel gas pattern unremarkable without evidence of obstruction or significant ileus. Expected stool burden in the colon. Phleboliths low in the left side of the pelvis. No visible opaque urinary tract calculi. Degenerative changes involving the lumbar spine and sacroiliac joints. IMPRESSION: No acute abdominal abnormality. Electronically Signed   By: Evangeline Dakin M.D.   On: 06/09/2015 22:23    Rasean Joos, DO  [facility] Pager [phone]  If 7PM-7AM, please contact night-coverage [email or web address] Password [email or web address] 06/14/2015, 1:08 PM   LOS: 13 days

## 2015-06-14 NOTE — Procedures (Signed)
Patient was seen on dialysis and the procedure was supervised.  BFR 300  Via PC BP is  123/73.   Patient appears to be tolerating treatment well  Jaqualyn Juday A 06/14/2015

## 2015-06-14 NOTE — Progress Notes (Signed)
Contacted by radiology due to the patient being new ESRD and hesitant to use intravenous dye for CT angiogram -Cased discussed with nephrology, Dr. Jimmy Footman -Nephrology recommends V/Q scan for now  -notifed by RN about coffee grounds emesis.  RN also tells me pt had episode of coffee grounds emesis on 5/21. -consulted [facility] -pt had EGD 05/16/15--monilial esophagitis, benign esophageal stenosis (Dr. Fuller Plan)  DTat

## 2015-06-15 ENCOUNTER — Telehealth: Payer: Self-pay | Admitting: Medical Oncology

## 2015-06-15 ENCOUNTER — Other Ambulatory Visit: Payer: Self-pay | Admitting: Medical Oncology

## 2015-06-15 ENCOUNTER — Telehealth: Payer: Self-pay | Admitting: Internal Medicine

## 2015-06-15 ENCOUNTER — Ambulatory Visit: Payer: Medicare Other

## 2015-06-15 DIAGNOSIS — N189 Chronic kidney disease, unspecified: Secondary | ICD-10-CM

## 2015-06-15 DIAGNOSIS — R1310 Dysphagia, unspecified: Secondary | ICD-10-CM

## 2015-06-15 DIAGNOSIS — E46 Unspecified protein-calorie malnutrition: Secondary | ICD-10-CM

## 2015-06-15 DIAGNOSIS — C9000 Multiple myeloma not having achieved remission: Secondary | ICD-10-CM

## 2015-06-15 LAB — CBC
HEMATOCRIT: 26.4 % — AB (ref 39.0–52.0)
HEMOGLOBIN: 8.3 g/dL — AB (ref 13.0–17.0)
MCH: 25.8 pg — ABNORMAL LOW (ref 26.0–34.0)
MCHC: 31.4 g/dL (ref 30.0–36.0)
MCV: 82 fL (ref 78.0–100.0)
PLATELETS: 82 10*3/uL — AB (ref 150–400)
RBC: 3.22 MIL/uL — AB (ref 4.22–5.81)
RDW: 15.8 % — AB (ref 11.5–15.5)
WBC: 4.3 10*3/uL (ref 4.0–10.5)

## 2015-06-15 LAB — PROTIME-INR
INR: 1.44 (ref 0.00–1.49)
PROTHROMBIN TIME: 17.6 s — AB (ref 11.6–15.2)

## 2015-06-15 LAB — GLUCOSE, CAPILLARY
GLUCOSE-CAPILLARY: 306 mg/dL — AB (ref 65–99)
Glucose-Capillary: 179 mg/dL — ABNORMAL HIGH (ref 65–99)

## 2015-06-15 LAB — TROPONIN I
TROPONIN I: 0.03 ng/mL (ref <0.031)
Troponin I: 0.04 ng/mL — ABNORMAL HIGH (ref <0.031)

## 2015-06-15 MED ORDER — METOCLOPRAMIDE HCL 5 MG PO TABS: 5.0000 mg | ORAL_TABLET | Freq: Three times a day (TID) | ORAL | Status: DC

## 2015-06-15 MED ORDER — SEVELAMER CARBONATE 800 MG PO TABS
800.0000 mg | ORAL_TABLET | Freq: Three times a day (TID) | ORAL | Status: DC
  Administered 2015-06-15: 800 mg via ORAL
  Filled 2015-06-15: qty 1

## 2015-06-15 MED ORDER — GLIPIZIDE 5 MG PO TABS: 2.5000 mg | ORAL_TABLET | Freq: Every day | ORAL | Status: DC

## 2015-06-15 MED ORDER — SEVELAMER CARBONATE 800 MG PO TABS: 800.0000 mg | ORAL_TABLET | Freq: Three times a day (TID) | ORAL | Status: DC

## 2015-06-15 MED ORDER — METOPROLOL TARTRATE 25 MG PO TABS: 12.5000 mg | ORAL_TABLET | Freq: Two times a day (BID) | ORAL | Status: DC

## 2015-06-15 MED ORDER — CYANOCOBALAMIN 500 MCG PO TABS: 500.0000 ug | ORAL_TABLET | Freq: Every day | ORAL | Status: DC

## 2015-06-15 MED ORDER — CHLORPROMAZINE HCL 25 MG PO TABS: 25.0000 mg | ORAL_TABLET | Freq: Three times a day (TID) | ORAL | Status: AC | PRN

## 2015-06-15 MED ORDER — HEPARIN SOD (PORK) LOCK FLUSH 100 UNIT/ML IV SOLN
500.0000 [IU] | INTRAVENOUS | Status: AC | PRN
  Administered 2015-06-15: 500 [IU]

## 2015-06-15 MED ORDER — CALCITRIOL 0.25 MCG PO CAPS: 0.2500 ug | ORAL_CAPSULE | ORAL | Status: AC

## 2015-06-15 MED ORDER — RENA-VITE PO TABS: 1.0000 | ORAL_TABLET | Freq: Every day | ORAL | Status: DC

## 2015-06-15 MED ORDER — ENSURE ENLIVE PO LIQD: 237.0000 mL | Freq: Two times a day (BID) | ORAL | Status: AC

## 2015-06-15 NOTE — Progress Notes (Signed)
Patient to be discharged to home. Port a cath de-accessed by IV team nurse. Discharge instructions and follow up appts reviewed with patient and patient's family member at bedside. Handouts given on CKD, hemodialysis, HD permanent access, renal diet, renvela, and calcitriol. Education given on caring for his [facility] and AVF, limb restriction bracelets for his AVF, monitoring fluid restriction and PO intake, monitoring for swelling, checking daily weights, and the importance of attending every HD treatment. New medications reviewed. Patient's family member to provide transport home.  Joellen Jersey, RN.

## 2015-06-15 NOTE — Discharge Summary (Signed)
Physician Discharge Summary  Eddie Thomas QMV:[id number] DOB: [birth year] DOA: 06/01/2015  PCP: [name]  Admit date: 06/01/2015 Discharge date: 06/15/2015  Admitted From: HOME Disposition:  HOME  Recommendations for Outpatient Follow-up:  1. Follow up with PCP in 1-2 weeks 2. Please obtain BMP/CBC in one week 3. Please Dr. Fuller Plan in 1 month  Home Health:NO Equipment/Devices:none Discharge Condition:stable CODE STATUS:FULL Diet recommendation: Carb modified Renal diet with 1200cc fluid restriction  Brief/Interim Summary Eddie Thomas is a 70 y.o. male with medical history significant of multiple myeloma, IgA subtype, currently undergoing treatment with Velcade/Daratumumab at the cancer center, follows Dr. Julien Nordmann of medical oncology, also having a history of chronic kidney disease (with baseline creatinine near 1.6-2.0), hypertension, diabetes mellitus, presented as a transfer from the cancer center for acute renal failure with creatinine of 10. Renal consulted, [facility] & AVF placed by [facility] & HD started. Intractable hiccups- unclear etiology. ABLA complicating chronic anemia > multiple antibodies on type and screen- plan for 2 PRBC's on 5/23. Acute AMS developed on 06/12/15 which was likely multifactorial including medications and possible new infection as pt had a fever up to 101.6. Since then, his mental status has improved and returned back to baseline. On 06/14/15, pt c/o of cp and sob. With his hx of DVT, V/QScan was obtained and it was negative. In addition, the patient had coffee grounds emesis during hospitalization. He was evaluated by [facility].  They did not feel the patient needed an additional EGD at this time as he has had recent EGD on 05/16/2015. His PPI was increased to twice a day, and Reglan was added.  Discharge Diagnoses:  Acute renal failure complicating chronic kidney disease: - Nephrology was consulted. As per nephrology, creatinine started to rise in  March-April 2017 up to 1.4-1.8 range with some bumps up to 2.3-2.5. On May 8, creatinine was 3.3. Now admitted with creatinine of 10. 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 Patient's myeloma is refractory to multiple different chemotherapy combinations and do not believe that his chemotherapy or pheresis is going to help his renal function. - Nephrology suggested palliative care consultation, to be pursued by oncology as outpatient. - Patient undergoing hemodialysis per nephrology. Last HD on 5/22. [facility] and AVF placed by [facility]. HD initiated by nephrology. Outpatient TTS dialysis >has been arranged by nephrology.  Acute encephalopathy -developed between 5/22 night and 5/23 AM -likely multifactorial including infection, meds, worsen anemia -new fever on 06/12/15 of 101.6--blood cultures negative at the time of discharge -06/12/15 CT brain negative -06/13/15--mental status improving -Discontinue Thorazine, baclofen, opioids, gabapentin, other hypnotics -check ammonia--24  -B12--266 -TSH--0.493, Free T4--1.04 -with B12 in low normal range-->started supplementation -Mental status returned to baseline at the time of discharge.  Atypical Chest Pain -developed 06/14/15 -with hx of DVT left leg, myeloma and new tachycardia-->VQ scan neg -EKG-no concerning ischemic changes -cycle troponins--unremarkable; minimal elevation likely due to ESRD  Multiple Myeloma: - Follows with Dr Julien Nordmann.  -refractory to multiple chemo regimens and SCT in 2012  Anemia - due to multiple myeloma, transfused 2 units PRBC earlier on 06/04/15. - Hemoglobin has dropped significantly in the last 48 hours from 7.6 > 5.8. Some of this was probably related to bleeding from [facility] site while he was on IV heparin-that has stopped since late Saturday. - Transfuse 2 units of PRBC 5/23 and follow posttransfusion hemoglobin. Blood bank called and stated patient has multiple antibodies related to his myeloma medications but director of blood  bank has cleared the  available blood for transfusion. Verified same with oncologist who has had similar experience in the past. -total 5 units received during the admit  Pancytopenia - Secondary to multiple myeloma and related cancer chemotherapy. - Anemia management as above. Thrombocytopenia stable.  Hyperkalemia - Resolved after dialysis.  NSVT: - Patient had 15 beats of nonsustained V. tach, hypomagnesemia corrected, 2-D echo done 11/2015 with EF 55-60% - Continue with telemetry monitor, would consider low-dose beta blocker if recurrence - May be related to renal failure, electrolyte abnormalities. Repeat 2-D echo: Normal EF.  History of DVT - Xarelto DC'ed given renal function (took Xarelto approximately one year) - Discussed with Dr. Julien Nordmann on 5/17 and he had recommended continued anticoagulation. Attempted IV heparin bridging and Coumadin but each time IV heparin was initiated, patient had bleeding from [facility] site and hence all anticoagulation was stopped. Not good anticoagulation candidate due to bleeding complications. Discussed with Dr. Julien Nordmann on 5/21.  Essential hypertension - ACEI stopped due to acute renal failure.  -Blood pressure was controlled, but gradually increased during hospitalization -Start patient on low-dose metoprolol tartrate 12.5 mg twice a day  Intractable hiccups, intermittent nausea and coffee grounds emesis - May not be due to uremia since it has persisted despite several hemodialysis. Briefly started on Ativan without significant improvement. Then was changed to baclofen (received 3 doses)-this was stopped on 5/22 due to concern for side effects in renal failure. Started on Thorazine 5/22. All meds stopped due to altered mental status of unclear etiology. -Intermittent hiccups continue since discontinuation of hypnotic medications -After his mental status improved, the patient was restarted back on po Thorazine with some improvement -[facility] was  consulted. They did not feel the patient needed any additional procedures at this time. PPI was increased to twice a day and Reglan was added. The patient will follow-up with Dr. Fuller Plan in the outpatient setting.  Diabetes mellitus type 2 -CBGs remained largely controlled -Discontinue Actos -Home with low-dose glipizide -Follow up with PCP   Discharge Instructions      Discharge Instructions    Diet - low sodium heart healthy    Complete by:  As directed      Increase activity slowly    Complete by:  As directed             Medication List    STOP taking these medications        dexamethasone 4 MG tablet  Commonly known as:  DECADRON     fluconazole 100 MG tablet  Commonly known as:  DIFLUCAN     gabapentin 300 MG capsule  Commonly known as:  NEURONTIN     lisinopril-hydrochlorothiazide 20-12.5 MG tablet  Commonly known as:  PRINZIDE,ZESTORETIC     oxyCODONE-acetaminophen 5-325 MG tablet  Commonly known as:  PERCOCET/ROXICET     pioglitazone 15 MG tablet  Commonly known as:  ACTOS     temazepam 15 MG capsule  Commonly known as:  RESTORIL     Vitamin D-3 5000 UNITS Tabs     XARELTO 20 MG Tabs tablet  Generic drug:  rivaroxaban      TAKE these medications        calcitRIOL 0.25 MCG capsule  Commonly known as:  ROCALTROL  Take 1 capsule (0.25 mcg total) by mouth Every Tuesday,Thursday,and Saturday with dialysis.     chlorproMAZINE 25 MG tablet  Commonly known as:  THORAZINE  Take 1 tablet (25 mg total) by mouth 3 (three) times daily as needed for hiccoughs.  cyanocobalamin 500 MCG tablet  Take 1 tablet (500 mcg total) by mouth daily.     feeding supplement (ENSURE ENLIVE) Liqd  Take 237 mLs by mouth 2 (two) times daily between meals.     glipiZIDE 5 MG tablet  Commonly known as:  GLUCOTROL  Take 0.5 tablets (2.5 mg total) by mouth daily before breakfast.     lidocaine-prilocaine cream  Commonly known as:  EMLA  Apply 1 application topically as  needed. Apply one hour before use and cover with plastic wrap.     metoCLOPramide 5 MG tablet  Commonly known as:  REGLAN  Take 1 tablet (5 mg total) by mouth 3 (three) times daily before meals.     metoprolol tartrate 25 MG tablet  Commonly known as:  LOPRESSOR  Take 0.5 tablets (12.5 mg total) by mouth 2 (two) times daily.     montelukast 10 MG tablet  Commonly known as:  SINGULAIR  Take 10 mg by mouth.     multivitamin Tabs tablet  Take 1 tablet by mouth at bedtime.     pantoprazole 40 MG tablet  Commonly known as:  PROTONIX  TAKE ONE TABLET BY MOUTH TWICE DAILY     prochlorperazine 10 MG tablet  Commonly known as:  COMPAZINE  Take 10 mg by mouth every 6 (six) hours as needed for nausea or vomiting. Reported on 05/14/2015     sevelamer carbonate 800 MG tablet  Commonly known as:  RENVELA  Take 1 tablet (800 mg total) by mouth 3 (three) times daily with meals.     simvastatin 10 MG tablet  Commonly known as:  ZOCOR  Take 10 mg by mouth at bedtime.     valACYclovir 500 MG tablet  Commonly known as:  VALTREX  TAKE ONE CAPLET BY MOUTH ONCE DAILY     VIAGRA 50 MG tablet  Generic drug:  sildenafil  Take 50 mg by mouth daily as needed for erectile dysfunction.       Follow-up Information    Follow up with Tinnie Gens, MD In 6 weeks.   Specialties:  Vascular Surgery, Interventional Cardiology, Cardiology   Why:  Our office will call you to arrange an appointment (sent)   Contact information:   [address] [place] [address] [phone]       Follow up with Norberto Sorenson T. Fuller Plan, MD In 1 month.   Specialty:  Gastroenterology   Contact information:   [address] [place] [address] [phone]       Follow up with Eilleen Kempf., MD In 2 weeks.   Specialty:  Oncology   Contact information:   [address] [place] [address] [phone]      No Known Allergies  Consultations:  Renal.  Med Oncology  IR  Vascular  surgery  GI   Procedures/Studies: Ct Head Wo Contrast  06/12/2015  CLINICAL DATA:  Confusion. Tearful. Undergoing treatment for multiple myeloma. Altered mental status. EXAM: CT HEAD WITHOUT CONTRAST TECHNIQUE: Contiguous axial images were obtained from the base of the skull through the vertex without intravenous contrast. COMPARISON:  None. FINDINGS: No acute cortical infarct, hemorrhage, or mass lesion is present. The ventricles are of normal size. No significant extra-axial fluid collection is evident. The paranasal sinuses and mastoid air cells are clear. The calvarium is intact. The globes and orbits are intact. No significant extracranial soft tissue lesion is present. IMPRESSION: Negative CT of the head. Electronically Signed   By: Wynetta Fines.D.  On: 06/12/2015 14:19   US Renal  06/01/2015  CLINICAL DATA:  Acute kidney injury, diabetes mellitus, hypertension and EXAM: RENAL / URINARY TRACT ULTRASOUND COMPLETE COMPARISON:  Abdominal ultrasound 03/13/2015 FINDINGS: Right Kidney: Length: 11.8 cm. Normal cortical thickness. Increased cortical echogenicity. No hydronephrosis or shadowing calcification. Two cysts identified at mid RIGHT kidney 1.6 x 1.4 x 1.5 cm and at the mid inferior pole 2.9 x 2.5 x 2.4 cm, smaller lesion demonstrating scattered internal echogenicity/complication. Left Kidney: Length: 13.9 cm. Normal cortical thickness. Increased cortical echogenicity. No mass, hydronephrosis or shadowing calcification. Bladder: Partially distended, unremarkable. IMPRESSION: Medical renal disease changes of both kidneys. 2 RIGHT renal cysts, larger 2.9 cm greatest size simple in character with smaller lesion 1.6 cm greatest size complicated by scattered internal echogenicity. Either followup ultrasound in 4-6 months to demonstrate stability or characterization by followup non emergent MRI after discharge when patient can optimally cooperate with MR imaging recommended to exclude cystic  renal neoplasm. Electronically Signed   By: Lavonia Dana M.D.   On: 06/01/2015 13:46   Nm Pulmonary Perf And Vent  06/14/2015  CLINICAL DATA:  70 year old male with chest pain and shortness of breath. History of myeloma and prior DVT. EXAM: NUCLEAR MEDICINE VENTILATION - PERFUSION LUNG SCAN TECHNIQUE: Ventilation images were obtained in multiple projections using inhaled aerosol Tc-60mDTPA. Perfusion images were obtained in multiple projections after intravenous injection of Tc-925mAA. RADIOPHARMACEUTICALS:  31.0 mCi Technetium-9932mPA aerosol inhalation and 4.2 mCi Technetium-32m27m IV COMPARISON:  06/12/2015 chest radiograph. FINDINGS: Ventilation: No focal ventilation defect. Perfusion: No wedge shaped peripheral perfusion defects to suggest acute pulmonary embolism. IMPRESSION: No evidence of acute pulmonary embolus. Electronically Signed   By: JeffMargarette Canada.   On: 06/14/2015 16:59   Ir Fluoro Guide Cv Line Right  06/05/2015  INDICATION: Acute kidney injury, multiple myeloma, renal failure EXAM: ULTRASOUND GUIDANCE FOR VASCULAR ACCESS RIGHT INTERNAL JUGULAR PERMANENT HEMODIALYSIS CATHETER Date:  5/16/20175/16/2017 3:53 pm Radiologist:  M. TrevDaryll Brod Guidance:  Ultrasound and fluoroscopic FLUOROSCOPY TIME:  Fluoroscopy Time:  30 seconds (2 mGy). MEDICATIONS: 1% lidocaine locally, 2 g Ancef administered within 1 hour of the procedure ANESTHESIA/SEDATION: Versed 0.5 mg IV; Fentanyl 25 mcg IV; Moderate Sedation Time:  15 minutes The patient was continuously monitored during the procedure by the interventional radiology nurse under my direct supervision. CONTRAST:  None COMPLICATIONS: None immediate. PROCEDURE: Informed consent was obtained from the patient following explanation of the procedure, risks, benefits and alternatives. The patient understands, agrees and consents for the procedure. All questions were addressed. A time out was performed. Maximal barrier sterile technique utilized including  caps, mask, sterile gowns, sterile gloves, large sterile drape, hand hygiene, and 2% chlorhexidine scrub. Under sterile conditions and local anesthesia, right internal jugular micropuncture venous access was performed with ultrasound. Images were obtained for documentation of the patent right internal jugular vein. A guide wire was inserted followed by a transitional dilator. Next, a 0.035 guidewire was advanced into the IVC with a 5-French catheter. Measurements were obtained from the right venotomy site to the proximal right atrium. In the right infraclavicular chest, a subcutaneous tunnel was created under sterile conditions and local anesthesia. 1% lidocaine with epinephrine was utilized for this. The 19 cm tip to cuff palindrome catheter was tunneled subcutaneously to the venotomy site and inserted into the SVC/RA junction through a valved peel-away sheath. Position was confirmed with fluoroscopy. Images were obtained for documentation. Blood was aspirated from the catheter followed by saline and  heparin flushes. The appropriate volume and strength of heparin was instilled in each lumen. Caps were applied. The catheter was secured at the tunnel site with Gelfoam and a pursestring suture. The venotomy site was closed with subcuticular Vicryl suture. Dermabond was applied to the small right neck incision. A dry sterile dressing was applied. The catheter is ready for use. No immediate complications. IMPRESSION: Ultrasound and fluoroscopically guided right internal jugular tunneled hemodialysis catheter (19 cm tip to cuff palindrome catheter). Electronically Signed   By: Jerilynn Mages.  Shick M.D.   On: 06/05/2015 16:34   Ir US Guide Vasc Access Right  06/05/2015  INDICATION: Acute kidney injury, multiple myeloma, renal failure EXAM: ULTRASOUND GUIDANCE FOR VASCULAR ACCESS RIGHT INTERNAL JUGULAR PERMANENT HEMODIALYSIS CATHETER Date:  5/16/20175/16/2017 3:53 pm Radiologist:  M. Daryll Brod, MD Guidance:  Ultrasound and  fluoroscopic FLUOROSCOPY TIME:  Fluoroscopy Time:  30 seconds (2 mGy). MEDICATIONS: 1% lidocaine locally, 2 g Ancef administered within 1 hour of the procedure ANESTHESIA/SEDATION: Versed 0.5 mg IV; Fentanyl 25 mcg IV; Moderate Sedation Time:  15 minutes The patient was continuously monitored during the procedure by the interventional radiology nurse under my direct supervision. CONTRAST:  None COMPLICATIONS: None immediate. PROCEDURE: Informed consent was obtained from the patient following explanation of the procedure, risks, benefits and alternatives. The patient understands, agrees and consents for the procedure. All questions were addressed. A time out was performed. Maximal barrier sterile technique utilized including caps, mask, sterile gowns, sterile gloves, large sterile drape, hand hygiene, and 2% chlorhexidine scrub. Under sterile conditions and local anesthesia, right internal jugular micropuncture venous access was performed with ultrasound. Images were obtained for documentation of the patent right internal jugular vein. A guide wire was inserted followed by a transitional dilator. Next, a 0.035 guidewire was advanced into the IVC with a 5-French catheter. Measurements were obtained from the right venotomy site to the proximal right atrium. In the right infraclavicular chest, a subcutaneous tunnel was created under sterile conditions and local anesthesia. 1% lidocaine with epinephrine was utilized for this. The 19 cm tip to cuff palindrome catheter was tunneled subcutaneously to the venotomy site and inserted into the SVC/RA junction through a valved peel-away sheath. Position was confirmed with fluoroscopy. Images were obtained for documentation. Blood was aspirated from the catheter followed by saline and heparin flushes. The appropriate volume and strength of heparin was instilled in each lumen. Caps were applied. The catheter was secured at the tunnel site with Gelfoam and a pursestring suture. The  venotomy site was closed with subcuticular Vicryl suture. Dermabond was applied to the small right neck incision. A dry sterile dressing was applied. The catheter is ready for use. No immediate complications. IMPRESSION: Ultrasound and fluoroscopically guided right internal jugular tunneled hemodialysis catheter (19 cm tip to cuff palindrome catheter). Electronically Signed   By: Jerilynn Mages.  Shick M.D.   On: 06/05/2015 16:34   Dg Chest Port 1 View  06/12/2015  CLINICAL DATA:  One day of fever, history of multiple myeloma, bone marrow transplant with complication, dialysis dependent renal failure. EXAM: PORTABLE CHEST 1 VIEW COMPARISON:  No recent chest x-ray in PACs comparison is made to a chest x-ray of November 21, 2011 FINDINGS: The lungs are adequately inflated. There is no focal infiltrate. There is no pleural effusion or pneumothorax. A dialysis catheter is in place via the right internal jugular approach with the tip projecting over the midportion of the SVC. A Port-A-Cath appliance is present with its tip projecting over the midportion of  the SVC. The heart is normal in size. The pulmonary vascularity is not engorged. The bony thorax exhibits no acute abnormality. The patient has undergone previous plate and screw fixation for proximal humeral fracture. IMPRESSION: There is no evidence of pneumonia. No acute cardiopulmonary abnormality is observed. Electronically Signed   By: Fletcher Rathbun  [place] M.D.   On: 06/12/2015 16:50   Dg Abd Portable 1v  06/09/2015  CLINICAL DATA:  70 year old with acute nausea and vomiting. EXAM: PORTABLE ABDOMEN - 1 VIEW COMPARISON:  None. FINDINGS: Bowel gas pattern unremarkable without evidence of obstruction or significant ileus. Expected stool burden in the colon. Phleboliths low in the left side of the pelvis. No visible opaque urinary tract calculi. Degenerative changes involving the lumbar spine and sacroiliac joints. IMPRESSION: No acute abdominal abnormality. Electronically Signed    By: Evangeline Dakin M.D.   On: 06/09/2015 22:23   (Echo, Carotid, EGD, Colonoscopy, ERCP)    Subjective:   Discharge Exam: Filed Vitals:   06/15/15 0434 06/15/15 0739  BP: 125/62 149/71  Pulse: 112 99  Temp: 98.1 F (36.7 C) 98.9 F (37.2 C)  Resp: 16 18   Filed Vitals:   06/14/15 1707 06/14/15 1958 06/15/15 0434 06/15/15 0739  BP: 161/64 154/65 125/62 149/71  Pulse: 98 99 112 99  Temp: 98.8 F (37.1 C) 98.9 F (37.2 C) 98.1 F (36.7 C) 98.9 F (37.2 C)  TempSrc: Oral   Oral  Resp: 18 16 16 18   Height:      Weight:  62.732 kg (138 lb 4.8 oz)    SpO2: 99% 99% 100% 100%    General: Pt is alert, awake, not in acute distress Cardiovascular: RRR, S1/S2 +, no rubs, no gallops Respiratory: CTA bilaterally, no wheezing, no rhonchi Abdominal: Soft, NT, ND, bowel sounds + Extremities: no edema, no cyanosis   The results of significant diagnostics from this hospitalization (including imaging, microbiology, ancillary and laboratory) are listed below for reference.    Significant Diagnostic Studies: Ct Head Wo Contrast  06/12/2015  CLINICAL DATA:  Confusion. Tearful. Undergoing treatment for multiple myeloma. Altered mental status. EXAM: CT HEAD WITHOUT CONTRAST TECHNIQUE: Contiguous axial images were obtained from the base of the skull through the vertex without intravenous contrast. COMPARISON:  None. FINDINGS: No acute cortical infarct, hemorrhage, or mass lesion is present. The ventricles are of normal size. No significant extra-axial fluid collection is evident. The paranasal sinuses and mastoid air cells are clear. The calvarium is intact. The globes and orbits are intact. No significant extracranial soft tissue lesion is present. IMPRESSION: Negative CT of the head. Electronically Signed   By: San Morelle M.D.   On: 06/12/2015 14:19   US Renal  06/01/2015  CLINICAL DATA:  Acute kidney injury, diabetes mellitus, hypertension and EXAM: RENAL / URINARY TRACT  ULTRASOUND COMPLETE COMPARISON:  Abdominal ultrasound 03/13/2015 FINDINGS: Right Kidney: Length: 11.8 cm. Normal cortical thickness. Increased cortical echogenicity. No hydronephrosis or shadowing calcification. Two cysts identified at mid RIGHT kidney 1.6 x 1.4 x 1.5 cm and at the mid inferior pole 2.9 x 2.5 x 2.4 cm, smaller lesion demonstrating scattered internal echogenicity/complication. Left Kidney: Length: 13.9 cm. Normal cortical thickness. Increased cortical echogenicity. No mass, hydronephrosis or shadowing calcification. Bladder: Partially distended, unremarkable. IMPRESSION: Medical renal disease changes of both kidneys. 2 RIGHT renal cysts, larger 2.9 cm greatest size simple in character with smaller lesion 1.6 cm greatest size complicated by scattered internal echogenicity. Either followup ultrasound in 4-6 months to demonstrate stability or characterization by  followup non emergent MRI after discharge when patient can optimally cooperate with MR imaging recommended to exclude cystic renal neoplasm. Electronically Signed   By: Lavonia Dana M.D.   On: 06/01/2015 13:46   Nm Pulmonary Perf And Vent  06/14/2015  CLINICAL DATA:  70 year old male with chest pain and shortness of breath. History of myeloma and prior DVT. EXAM: NUCLEAR MEDICINE VENTILATION - PERFUSION LUNG SCAN TECHNIQUE: Ventilation images were obtained in multiple projections using inhaled aerosol Tc-31mDTPA. Perfusion images were obtained in multiple projections after intravenous injection of Tc-93mAA. RADIOPHARMACEUTICALS:  31.0 mCi Technetium-99109mPA aerosol inhalation and 4.2 mCi Technetium-80m63m IV COMPARISON:  06/12/2015 chest radiograph. FINDINGS: Ventilation: No focal ventilation defect. Perfusion: No wedge shaped peripheral perfusion defects to suggest acute pulmonary embolism. IMPRESSION: No evidence of acute pulmonary embolus. Electronically Signed   By: JeffMargarette Canada.   On: 06/14/2015 16:59   Ir Fluoro Guide Cv Line  Right  06/05/2015  INDICATION: Acute kidney injury, multiple myeloma, renal failure EXAM: ULTRASOUND GUIDANCE FOR VASCULAR ACCESS RIGHT INTERNAL JUGULAR PERMANENT HEMODIALYSIS CATHETER Date:  5/16/20175/16/2017 3:53 pm Radiologist:  M. TrevDaryll Brod Guidance:  Ultrasound and fluoroscopic FLUOROSCOPY TIME:  Fluoroscopy Time:  30 seconds (2 mGy). MEDICATIONS: 1% lidocaine locally, 2 g Ancef administered within 1 hour of the procedure ANESTHESIA/SEDATION: Versed 0.5 mg IV; Fentanyl 25 mcg IV; Moderate Sedation Time:  15 minutes The patient was continuously monitored during the procedure by the interventional radiology nurse under my direct supervision. CONTRAST:  None COMPLICATIONS: None immediate. PROCEDURE: Informed consent was obtained from the patient following explanation of the procedure, risks, benefits and alternatives. The patient understands, agrees and consents for the procedure. All questions were addressed. A time out was performed. Maximal barrier sterile technique utilized including caps, mask, sterile gowns, sterile gloves, large sterile drape, hand hygiene, and 2% chlorhexidine scrub. Under sterile conditions and local anesthesia, right internal jugular micropuncture venous access was performed with ultrasound. Images were obtained for documentation of the patent right internal jugular vein. A guide wire was inserted followed by a transitional dilator. Next, a 0.035 guidewire was advanced into the IVC with a 5-French catheter. Measurements were obtained from the right venotomy site to the proximal right atrium. In the right infraclavicular chest, a subcutaneous tunnel was created under sterile conditions and local anesthesia. 1% lidocaine with epinephrine was utilized for this. The 19 cm tip to cuff palindrome catheter was tunneled subcutaneously to the venotomy site and inserted into the SVC/RA junction through a valved peel-away sheath. Position was confirmed with fluoroscopy. Images were  obtained for documentation. Blood was aspirated from the catheter followed by saline and heparin flushes. The appropriate volume and strength of heparin was instilled in each lumen. Caps were applied. The catheter was secured at the tunnel site with Gelfoam and a pursestring suture. The venotomy site was closed with subcuticular Vicryl suture. Dermabond was applied to the small right neck incision. A dry sterile dressing was applied. The catheter is ready for use. No immediate complications. IMPRESSION: Ultrasound and fluoroscopically guided right internal jugular tunneled hemodialysis catheter (19 cm tip to cuff palindrome catheter). Electronically Signed   By: M.  Jerilynn Mageshick M.D.   On: 06/05/2015 16:34   Ir Us GKoreade Vasc Access Right  06/05/2015  INDICATION: Acute kidney injury, multiple myeloma, renal failure EXAM: ULTRASOUND GUIDANCE FOR VASCULAR ACCESS RIGHT INTERNAL JUGULAR PERMANENT HEMODIALYSIS CATHETER Date:  5/16/20175/16/2017 3:53 pm Radiologist:  M. TrevDaryll Brod Guidance:  Ultrasound and fluoroscopic FLUOROSCOPY TIME:  Fluoroscopy Time:  30 seconds (2 mGy). MEDICATIONS: 1% lidocaine locally, 2 g Ancef administered within 1 hour of the procedure ANESTHESIA/SEDATION: Versed 0.5 mg IV; Fentanyl 25 mcg IV; Moderate Sedation Time:  15 minutes The patient was continuously monitored during the procedure by the interventional radiology nurse under my direct supervision. CONTRAST:  None COMPLICATIONS: None immediate. PROCEDURE: Informed consent was obtained from the patient following explanation of the procedure, risks, benefits and alternatives. The patient understands, agrees and consents for the procedure. All questions were addressed. A time out was performed. Maximal barrier sterile technique utilized including caps, mask, sterile gowns, sterile gloves, large sterile drape, hand hygiene, and 2% chlorhexidine scrub. Under sterile conditions and local anesthesia, right internal jugular micropuncture venous  access was performed with ultrasound. Images were obtained for documentation of the patent right internal jugular vein. A guide wire was inserted followed by a transitional dilator. Next, a 0.035 guidewire was advanced into the IVC with a 5-French catheter. Measurements were obtained from the right venotomy site to the proximal right atrium. In the right infraclavicular chest, a subcutaneous tunnel was created under sterile conditions and local anesthesia. 1% lidocaine with epinephrine was utilized for this. The 19 cm tip to cuff palindrome catheter was tunneled subcutaneously to the venotomy site and inserted into the SVC/RA junction through a valved peel-away sheath. Position was confirmed with fluoroscopy. Images were obtained for documentation. Blood was aspirated from the catheter followed by saline and heparin flushes. The appropriate volume and strength of heparin was instilled in each lumen. Caps were applied. The catheter was secured at the tunnel site with Gelfoam and a pursestring suture. The venotomy site was closed with subcuticular Vicryl suture. Dermabond was applied to the small right neck incision. A dry sterile dressing was applied. The catheter is ready for use. No immediate complications. IMPRESSION: Ultrasound and fluoroscopically guided right internal jugular tunneled hemodialysis catheter (19 cm tip to cuff palindrome catheter). Electronically Signed   By: Jerilynn Mages.  Shick M.D.   On: 06/05/2015 16:34   Dg Chest Port 1 View  06/12/2015  CLINICAL DATA:  One day of fever, history of multiple myeloma, bone marrow transplant with complication, dialysis dependent renal failure. EXAM: PORTABLE CHEST 1 VIEW COMPARISON:  No recent chest x-ray in PACs comparison is made to a chest x-ray of November 21, 2011 FINDINGS: The lungs are adequately inflated. There is no focal infiltrate. There is no pleural effusion or pneumothorax. A dialysis catheter is in place via the right internal jugular approach with the  tip projecting over the midportion of the SVC. A Port-A-Cath appliance is present with its tip projecting over the midportion of the SVC. The heart is normal in size. The pulmonary vascularity is not engorged. The bony thorax exhibits no acute abnormality. The patient has undergone previous plate and screw fixation for proximal humeral fracture. IMPRESSION: There is no evidence of pneumonia. No acute cardiopulmonary abnormality is observed. Electronically Signed   By: Liliya Fullenwider  [place] M.D.   On: 06/12/2015 16:50   Dg Abd Portable 1v  06/09/2015  CLINICAL DATA:  70 year old with acute nausea and vomiting. EXAM: PORTABLE ABDOMEN - 1 VIEW COMPARISON:  None. FINDINGS: Bowel gas pattern unremarkable without evidence of obstruction or significant ileus. Expected stool burden in the colon. Phleboliths low in the left side of the pelvis. No visible opaque urinary tract calculi. Degenerative changes involving the lumbar spine and sacroiliac joints. IMPRESSION: No acute abdominal abnormality. Electronically Signed   By: Sherran Needs.D.  On: 06/09/2015 22:23     Microbiology: Recent Results (from the past 240 hour(s))  Surgical PCR screen     Status: None   Collection Time: 06/08/15  9:58 AM  Result Value Ref Range Status   MRSA, PCR NEGATIVE NEGATIVE Final   Staphylococcus aureus NEGATIVE NEGATIVE Final    Comment:        The Xpert SA Assay (FDA approved for NASAL specimens in patients over 51 years of age), is one component of a comprehensive surveillance program.  Test performance has been validated by [facility] for patients greater than or equal to 6 year old. It is not intended to diagnose infection nor to guide or monitor treatment.   Culture, blood (Routine X 2) w Reflex to ID Panel     Status: None (Preliminary result)   Collection Time: 06/12/15  8:58 PM  Result Value Ref Range Status   Specimen Description RIGHT ANTECUBITAL  Final   Special Requests IN BOTH AEROBIC AND ANAEROBIC  BOTTLES 5CC  Final   Culture NO GROWTH 2 DAYS  Final   Report Status PENDING  Incomplete  Culture, blood (Routine X 2) w Reflex to ID Panel     Status: None (Preliminary result)   Collection Time: 06/12/15  9:07 PM  Result Value Ref Range Status   Specimen Description RIGHT ANTECUBITAL  Final   Special Requests IN BOTH AEROBIC AND ANAEROBIC BOTTLES 5CC  Final   Culture NO GROWTH 2 DAYS  Final   Report Status PENDING  Incomplete     Labs: Basic Metabolic Panel:  Recent Labs Lab 06/08/15 1209 06/09/15 0756 06/12/15 0832 06/13/15 0500 06/14/15 0649  NA 138 140 140 137 137  K 3.8 3.4* 4.3 4.1 3.8  CL 98* 98* 96* 97* 97*  CO2 28 29 26 24 23   GLUCOSE 199* 132* 161* 139* 181*  BUN 16 19 76* 35* 57*  CREATININE 4.20* 5.23* 10.04* 5.50* 8.06*  CALCIUM 8.4* 8.6* 8.7* 8.0* 8.7*  PHOS  --  3.6 5.4*  --  6.3*   Liver Function Tests:  Recent Labs Lab 06/09/15 0756 06/12/15 0832 06/14/15 0649  ALBUMIN 2.3* 2.3* 2.5*   No results for input(s): LIPASE, AMYLASE in the last 168 hours.  Recent Labs Lab 06/13/15 1135  AMMONIA 24   CBC:  Recent Labs Lab 06/12/15 0420 06/12/15 2058 06/13/15 0500 06/14/15 0442 06/14/15 0649 06/15/15 0520  WBC 3.8*  --  4.8 3.6* 4.3 4.3  HGB 5.8* 8.0* 8.9* 9.0* 9.3* 8.3*  HCT 18.8* 25.2* 27.9* 27.3* 28.6* 26.4*  MCV 82.5  --  83.0 80.1 81.0 82.0  PLT 70*  --  82* 82* 82* 82*   Cardiac Enzymes:  Recent Labs Lab 06/14/15 1900 06/15/15 0520  TROPONINI <0.03 0.04*   BNP: Invalid input(s): POCBNP CBG:  Recent Labs Lab 06/13/15 2135 06/14/15 1109 06/14/15 1649 06/14/15 2003 06/15/15 0737  GLUCAP 102* 148* 225* 150* 179*    Time coordinating discharge:  Greater than 30 minutes  Signed:  Herrick Hartog, DO [facility] Pager: [phone] 06/15/2015, 11:07 AM

## 2015-06-15 NOTE — Progress Notes (Signed)
[facility] Progress Note  Assessment/Plan: 1. 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 Set up OP HD [place],  if goes home today, needs to leave in time to go by the dialysis unit at [facility] today to sign papers- in order to start there on Saturday 2. Bleeding TDC site resolved- IV hep stopped, agree not good candidate for anticoag at this time- no more obvious bleeding  3. BP/volume - no meds, bp's ok- titrating edw - no overt volume 4. 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 With history of cancer and DVT, ESA started 5/23- Fe studies pre transfusion 34% sat ferritin 1822- given transfusion Fe load, no indication for additional Fe now; transfused total of 2 units PRBC; Hgb  9.3 5/25 down to 8.3 5/26 5. Multiple myeloma - refractory to mult regimens and sp SCT 2012 6. 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 As above, not good anticoag candidate d/t bleeding complication 7. Bones- phos 6.3 on no binders- iPTH 5/23 397 - start low dose calcitriol -corrected Ca 9.9 - will start renvela 800 1 ac- given all his symptoms, it is hard to know which binder would be most easily tolerated 8. Thrombocytopenia Plts 82 stable 9. Anxiety/tearfulness -5/23 - resolved - ? Reaction to meds or precursor to fever spike (favor this) 10 Fever spike 5/23 101.5 -has TDC - CXR neg, WBC unchanged Blood cultures drawn- no abtx given Asymptomatic except some N, V, anorexia - no further temp spikes. BC no growth to date 11 Hiccups - baclofen contraindicated; had ? Reaction to thorazine subsequently spiked a fever that same day- favor; thorazine resumed at 25 tid - this is higher than his previous dose of 10 of which he only had one dose 12. Coffee ground emesis (5/21 per primary note)- hgb down 1 gm from 5/25; seen by GI yesterday - increase PPI to BID and try low dose reglan 13. CP/SOB - neg VQ scan 5/25 14. Disp - ok for renal for d/c  Myriam Jacobson,  PA-C [place] [phone] 06/15/2015,8:55 AM  LOS: 14 days    Patient seen and examined, agree with above note with above modifications. Possible discharge today to start OP HD tomorrow- would need to be out on time to go by kidney center and sign papers if goes home today  Corliss Parish, MD 06/15/2015     Subjective:   Said SOB and CP yesterday might have been from hiccups. Wants to go home today; thinks thorazine helping with hiccups some  Objective Filed Vitals:   06/14/15 1707 06/14/15 1958 06/15/15 0434 06/15/15 0739  BP: 161/64 154/65 125/62 149/71  Pulse: 98 99 112 99  Temp: 98.8 F (37.1 C) 98.9 F (37.2 C) 98.1 F (36.7 C) 98.9 F (37.2 C)  TempSrc: Oral   Oral  Resp: 18 16 16 18   Height:      Weight:  62.732 kg (138 lb 4.8 oz)    SpO2: 99% 99% 100% 100%   Physical Exam General: hiccups - NAD reading newspaper Heart: RRR Lungs: no rales Abdomen: soft Extremities: no edema Dialysis Access: right IJ and left upper AVF + bruit  Dialysis Orders:  Additional Objective Labs: Basic Metabolic Panel:  Recent Labs Lab 06/09/15 0756 06/12/15 0832 06/13/15 0500 06/14/15 0649  NA 140 140 137 137  K 3.4* 4.3 4.1 3.8  CL 98* 96* 97* 97*  CO2 29 26 24  23  GLUCOSE 132* 161* 139* 181*  BUN 19 76* 35* 57*  CREATININE 5.23* 10.04* 5.50* 8.06*  CALCIUM 8.6* 8.7* 8.0* 8.7*  PHOS 3.6 5.4*  --  6.3*   Liver Function Tests:  Recent Labs Lab 06/09/15 0756 06/12/15 0832 06/14/15 0649  ALBUMIN 2.3* 2.3* 2.5*   CBC:  Recent Labs Lab 06/12/15 0420  06/13/15 0500 06/14/15 0442 06/14/15 0649 06/15/15 0520  WBC 3.8*  --  4.8 3.6* 4.3 4.3  HGB 5.8*  < > 8.9* 9.0* 9.3* 8.3*  HCT 18.8*  < > 27.9* 27.3* 28.6* 26.4*  MCV 82.5  --  83.0 80.1 81.0 82.0  PLT 70*  --  82* 82* 82* 82*  < > = values in this interval not displayed. Blood Culture    Component Value Date/Time   SDES RIGHT ANTECUBITAL 06/12/2015 2107   SPECREQUEST IN BOTH  AEROBIC AND ANAEROBIC BOTTLES 5CC 06/12/2015 2107   CULT NO GROWTH 2 DAYS 06/12/2015 2107   REPTSTATUS PENDING 06/12/2015 2107    Cardiac Enzymes:  Recent Labs Lab 06/14/15 1900 06/15/15 0520  TROPONINI <0.03 0.04*   CBG:  Recent Labs Lab 06/13/15 2135 06/14/15 1109 06/14/15 1649 06/14/15 2003 06/15/15 0737  GLUCAP 102* 148* 225* 150* 179*   Iron Studies:  Recent Labs  06/12/15 0910  IRON 49  TIBC 146*  FERRITIN 1822*   Studies/Results: Nm Pulmonary Perf And Vent  06/14/2015  CLINICAL DATA:  70 year old male with chest pain and shortness of breath. History of myeloma and prior DVT. EXAM: NUCLEAR MEDICINE VENTILATION - PERFUSION LUNG SCAN TECHNIQUE: Ventilation images were obtained in multiple projections using inhaled aerosol [id number]. Perfusion images were obtained in multiple projections after intravenous injection of Tc-952mAA. RADIOPHARMACEUTICALS:  31.0 mCi Technetium-9953mPA aerosol inhalation and 4.2 mCi Technetium-46m41m IV COMPARISON:  06/12/2015 chest radiograph. FINDINGS: Ventilation: No focal ventilation defect. Perfusion: No wedge shaped peripheral perfusion defects to suggest acute pulmonary embolism. IMPRESSION: No evidence of acute pulmonary embolus. Electronically Signed   By: JeffMargarette [place].   On: 06/14/2015 16:59   Medications: . sodium chloride 10 mL/hr at 06/09/15 0552   . sodium chloride   Intravenous Once  . calcitRIOL  0.25 mcg Oral Q T,Th,Sa-HD  . chlorproMAZINE  25 mg Oral TID  . darbepoetin (ARANESP) injection - DIALYSIS  100 mcg Intravenous Q Tue-HD  . feeding supplement (ENSURE ENLIVE)  237 mL Oral BID BM  . insulin aspart  0-15 Units Subcutaneous TID WC  . insulin aspart  0-5 Units Subcutaneous QHS  . metoCLOPramide  5 mg Oral TID AC  . multivitamin  1 tablet Oral QHS  . ondansetron  4 mg Oral TID  . pantoprazole  40 mg Oral BID  . sodium chloride flush  3 mL Intravenous Q12H  . THROMBI-PAD  1 each Topical Once  . vitamin B-12   500 mcg Oral Daily

## 2015-06-15 NOTE — Plan of Care (Signed)
Problem: Fluid Volume: Goal: Compliance with measures to maintain balanced fluid volume will improve Outcome: Adequate for Discharge Educated on the importance of following a fluid restriction.   Problem: Discharge Planning: Goal: Ability to manage health-related needs will improve Outcome: Completed/Met Date Met:  06/15/15 Setup with outpatient HD center. Patient aware of where and when to go.   Problem: Nutritional: Goal: Ability to make healthy dietary choices will improve Outcome: Adequate for Discharge Educated on renal diet. Handouts given.  Problem: Physical Regulation: Goal: Complications related to the disease process, condition or treatment will be avoided or minimized Outcome: Adequate for Discharge Educated on caring for his [facility] and AVF. Handouts also given.

## 2015-06-15 NOTE — Telephone Encounter (Signed)
I told pt his infusion is cancelled for next week and to expect a call about next appt. He said he is being discharged today.

## 2015-06-15 NOTE — Telephone Encounter (Signed)
S.w. Pt and advised on 6.14 appt...Marland KitchenMarland KitchenDiane emailed MD to see if pt needs sooner appt

## 2015-06-16 ENCOUNTER — Inpatient Hospital Stay (HOSPITAL_COMMUNITY)
Admission: EM | Admit: 2015-06-16 | Discharge: 2015-06-28 | DRG: 177 | Disposition: A | Discharge status: Hospice | Payer: Medicare Other | Attending: Internal Medicine | Admitting: Internal Medicine

## 2015-06-16 ENCOUNTER — Emergency Department (HOSPITAL_COMMUNITY): Payer: Medicare Other

## 2015-06-16 ENCOUNTER — Encounter (HOSPITAL_COMMUNITY): Payer: Self-pay | Admitting: Emergency Medicine

## 2015-06-16 DIAGNOSIS — D62 Acute posthemorrhagic anemia: Secondary | ICD-10-CM | POA: Diagnosis present

## 2015-06-16 DIAGNOSIS — N189 Chronic kidney disease, unspecified: Secondary | ICD-10-CM | POA: Diagnosis not present

## 2015-06-16 DIAGNOSIS — Z9484 Stem cells transplant status: Secondary | ICD-10-CM

## 2015-06-16 DIAGNOSIS — K228 Other specified diseases of esophagus: Secondary | ICD-10-CM | POA: Diagnosis present

## 2015-06-16 DIAGNOSIS — E1122 Type 2 diabetes mellitus with diabetic chronic kidney disease: Secondary | ICD-10-CM | POA: Diagnosis present

## 2015-06-16 DIAGNOSIS — Z992 Dependence on renal dialysis: Secondary | ICD-10-CM

## 2015-06-16 DIAGNOSIS — Z9842 Cataract extraction status, left eye: Secondary | ICD-10-CM | POA: Diagnosis not present

## 2015-06-16 DIAGNOSIS — E118 Type 2 diabetes mellitus with unspecified complications: Secondary | ICD-10-CM | POA: Diagnosis not present

## 2015-06-16 DIAGNOSIS — N2581 Secondary hyperparathyroidism of renal origin: Secondary | ICD-10-CM | POA: Diagnosis present

## 2015-06-16 DIAGNOSIS — E785 Hyperlipidemia, unspecified: Secondary | ICD-10-CM | POA: Diagnosis present

## 2015-06-16 DIAGNOSIS — K222 Esophageal obstruction: Secondary | ICD-10-CM | POA: Diagnosis present

## 2015-06-16 DIAGNOSIS — K921 Melena: Secondary | ICD-10-CM | POA: Diagnosis not present

## 2015-06-16 DIAGNOSIS — D696 Thrombocytopenia, unspecified: Secondary | ICD-10-CM | POA: Diagnosis not present

## 2015-06-16 DIAGNOSIS — D61810 Antineoplastic chemotherapy induced pancytopenia: Secondary | ICD-10-CM | POA: Diagnosis present

## 2015-06-16 DIAGNOSIS — T451X5A Adverse effect of antineoplastic and immunosuppressive drugs, initial encounter: Secondary | ICD-10-CM | POA: Diagnosis present

## 2015-06-16 DIAGNOSIS — E8889 Other specified metabolic disorders: Secondary | ICD-10-CM | POA: Diagnosis present

## 2015-06-16 DIAGNOSIS — D630 Anemia in neoplastic disease: Secondary | ICD-10-CM | POA: Diagnosis present

## 2015-06-16 DIAGNOSIS — Z66 Do not resuscitate: Secondary | ICD-10-CM | POA: Diagnosis present

## 2015-06-16 DIAGNOSIS — Z9841 Cataract extraction status, right eye: Secondary | ICD-10-CM

## 2015-06-16 DIAGNOSIS — R64 Cachexia: Secondary | ICD-10-CM | POA: Diagnosis present

## 2015-06-16 DIAGNOSIS — J690 Pneumonitis due to inhalation of food and vomit: Secondary | ICD-10-CM | POA: Diagnosis present

## 2015-06-16 DIAGNOSIS — Z800 Family history of malignant neoplasm of digestive organs: Secondary | ICD-10-CM

## 2015-06-16 DIAGNOSIS — Z86718 Personal history of other venous thrombosis and embolism: Secondary | ICD-10-CM | POA: Diagnosis not present

## 2015-06-16 DIAGNOSIS — C9000 Multiple myeloma not having achieved remission: Secondary | ICD-10-CM | POA: Diagnosis present

## 2015-06-16 DIAGNOSIS — D6481 Anemia due to antineoplastic chemotherapy: Secondary | ICD-10-CM | POA: Diagnosis not present

## 2015-06-16 DIAGNOSIS — E43 Unspecified severe protein-calorie malnutrition: Secondary | ICD-10-CM | POA: Insufficient documentation

## 2015-06-16 DIAGNOSIS — Z833 Family history of diabetes mellitus: Secondary | ICD-10-CM | POA: Diagnosis not present

## 2015-06-16 DIAGNOSIS — R1314 Dysphagia, pharyngoesophageal phase: Secondary | ICD-10-CM | POA: Insufficient documentation

## 2015-06-16 DIAGNOSIS — K922 Gastrointestinal hemorrhage, unspecified: Secondary | ICD-10-CM | POA: Diagnosis not present

## 2015-06-16 DIAGNOSIS — Z515 Encounter for palliative care: Secondary | ICD-10-CM | POA: Diagnosis not present

## 2015-06-16 DIAGNOSIS — N186 End stage renal disease: Secondary | ICD-10-CM

## 2015-06-16 DIAGNOSIS — I959 Hypotension, unspecified: Secondary | ICD-10-CM | POA: Diagnosis not present

## 2015-06-16 DIAGNOSIS — N179 Acute kidney failure, unspecified: Secondary | ICD-10-CM | POA: Diagnosis present

## 2015-06-16 DIAGNOSIS — K210 Gastro-esophageal reflux disease with esophagitis: Secondary | ICD-10-CM | POA: Diagnosis present

## 2015-06-16 DIAGNOSIS — R509 Fever, unspecified: Secondary | ICD-10-CM | POA: Diagnosis not present

## 2015-06-16 DIAGNOSIS — K449 Diaphragmatic hernia without obstruction or gangrene: Secondary | ICD-10-CM | POA: Diagnosis present

## 2015-06-16 DIAGNOSIS — K769 Liver disease, unspecified: Secondary | ICD-10-CM | POA: Diagnosis present

## 2015-06-16 DIAGNOSIS — E860 Dehydration: Secondary | ICD-10-CM | POA: Diagnosis present

## 2015-06-16 DIAGNOSIS — Z86010 Personal history of colonic polyps: Secondary | ICD-10-CM | POA: Diagnosis not present

## 2015-06-16 DIAGNOSIS — Z79899 Other long term (current) drug therapy: Secondary | ICD-10-CM | POA: Diagnosis not present

## 2015-06-16 DIAGNOSIS — Z681 Body mass index (BMI) 19 or less, adult: Secondary | ICD-10-CM | POA: Diagnosis not present

## 2015-06-16 DIAGNOSIS — Z7189 Other specified counseling: Secondary | ICD-10-CM | POA: Diagnosis not present

## 2015-06-16 DIAGNOSIS — Z7984 Long term (current) use of oral hypoglycemic drugs: Secondary | ICD-10-CM | POA: Diagnosis not present

## 2015-06-16 DIAGNOSIS — K920 Hematemesis: Secondary | ICD-10-CM | POA: Diagnosis not present

## 2015-06-16 DIAGNOSIS — J9811 Atelectasis: Secondary | ICD-10-CM | POA: Diagnosis present

## 2015-06-16 DIAGNOSIS — K2211 Ulcer of esophagus with bleeding: Secondary | ICD-10-CM | POA: Diagnosis present

## 2015-06-16 DIAGNOSIS — R066 Hiccough: Secondary | ICD-10-CM | POA: Diagnosis present

## 2015-06-16 DIAGNOSIS — R0600 Dyspnea, unspecified: Secondary | ICD-10-CM

## 2015-06-16 DIAGNOSIS — K208 Other esophagitis: Secondary | ICD-10-CM | POA: Diagnosis not present

## 2015-06-16 DIAGNOSIS — Z9114 Patient's other noncompliance with medication regimen: Secondary | ICD-10-CM | POA: Diagnosis not present

## 2015-06-16 DIAGNOSIS — G4700 Insomnia, unspecified: Secondary | ICD-10-CM | POA: Diagnosis present

## 2015-06-16 DIAGNOSIS — B3781 Candidal esophagitis: Secondary | ICD-10-CM | POA: Diagnosis present

## 2015-06-16 DIAGNOSIS — G92 Toxic encephalopathy: Secondary | ICD-10-CM | POA: Diagnosis present

## 2015-06-16 DIAGNOSIS — R000 Tachycardia, unspecified: Secondary | ICD-10-CM

## 2015-06-16 DIAGNOSIS — I120 Hypertensive chronic kidney disease with stage 5 chronic kidney disease or end stage renal disease: Secondary | ICD-10-CM | POA: Diagnosis present

## 2015-06-16 DIAGNOSIS — R112 Nausea with vomiting, unspecified: Secondary | ICD-10-CM | POA: Diagnosis present

## 2015-06-16 DIAGNOSIS — I472 Ventricular tachycardia: Secondary | ICD-10-CM | POA: Diagnosis present

## 2015-06-16 DIAGNOSIS — D61818 Other pancytopenia: Secondary | ICD-10-CM | POA: Diagnosis present

## 2015-06-16 DIAGNOSIS — R1310 Dysphagia, unspecified: Secondary | ICD-10-CM | POA: Diagnosis not present

## 2015-06-16 DIAGNOSIS — Z961 Presence of intraocular lens: Secondary | ICD-10-CM | POA: Diagnosis present

## 2015-06-16 DIAGNOSIS — E46 Unspecified protein-calorie malnutrition: Secondary | ICD-10-CM

## 2015-06-16 DIAGNOSIS — R531 Weakness: Secondary | ICD-10-CM | POA: Diagnosis not present

## 2015-06-16 DIAGNOSIS — R110 Nausea: Secondary | ICD-10-CM | POA: Diagnosis not present

## 2015-06-16 DIAGNOSIS — R6510 Systemic inflammatory response syndrome (SIRS) of non-infectious origin without acute organ dysfunction: Secondary | ICD-10-CM | POA: Diagnosis present

## 2015-06-16 DIAGNOSIS — E119 Type 2 diabetes mellitus without complications: Secondary | ICD-10-CM

## 2015-06-16 LAB — TYPE AND SCREEN
ABO/RH(D): AB POS
Antibody Screen: POSITIVE
DAT, IgG: NEGATIVE
UNIT DIVISION: 0
UNIT DIVISION: 0
UNIT DIVISION: 0
Unit division: 0
Unit division: 0

## 2015-06-16 LAB — BASIC METABOLIC PANEL
ANION GAP: 14 (ref 5–15)
BUN: 15 mg/dL (ref 6–20)
CALCIUM: 8.1 mg/dL — AB (ref 8.9–10.3)
CHLORIDE: 95 mmol/L — AB (ref 101–111)
CO2: 24 mmol/L (ref 22–32)
CREATININE: 3.77 mg/dL — AB (ref 0.61–1.24)
GFR calc non Af Amer: 15 mL/min — ABNORMAL LOW (ref >60)
GFR, EST AFRICAN AMERICAN: 17 mL/min — AB (ref >60)
Glucose, Bld: 179 mg/dL — ABNORMAL HIGH (ref 65–99)
Potassium: 4.5 mmol/L (ref 3.5–5.1)
SODIUM: 133 mmol/L — AB (ref 135–145)

## 2015-06-16 LAB — CBC
HCT: 27.1 % — ABNORMAL LOW (ref 39.0–52.0)
Hemoglobin: 8.6 g/dL — ABNORMAL LOW (ref 13.0–17.0)
MCH: 26.4 pg (ref 26.0–34.0)
MCHC: 31.7 g/dL (ref 30.0–36.0)
MCV: 83.1 fL (ref 78.0–100.0)
PLATELETS: 89 10*3/uL — AB (ref 150–400)
RBC: 3.26 MIL/uL — AB (ref 4.22–5.81)
RDW: 16 % — ABNORMAL HIGH (ref 11.5–15.5)
WBC: 4.5 10*3/uL (ref 4.0–10.5)

## 2015-06-16 LAB — I-STAT CG4 LACTIC ACID, ED
Lactic Acid, Venous: 0.99 mmol/L (ref 0.5–2.0)
Lactic Acid, Venous: 0.94 mmol/L (ref 0.5–2.0)

## 2015-06-16 LAB — I-STAT TROPONIN, ED: Troponin i, poc: 0.02 ng/mL (ref 0.00–0.08)

## 2015-06-16 LAB — GLUCOSE, CAPILLARY: Glucose-Capillary: 177 mg/dL — ABNORMAL HIGH (ref 65–99)

## 2015-06-16 MED ORDER — SODIUM CHLORIDE 0.9 % IV BOLUS (SEPSIS)
1000.0000 mL | Freq: Once | INTRAVENOUS | Status: AC
Start: 2015-06-16 — End: 2015-06-16
  Administered 2015-06-16: 1000 mL via INTRAVENOUS

## 2015-06-16 MED ORDER — SODIUM CHLORIDE 0.9% FLUSH
3.0000 mL | Freq: Two times a day (BID) | INTRAVENOUS | Status: DC
  Administered 2015-06-17 – 2015-06-26 (×5): 3 mL via INTRAVENOUS

## 2015-06-16 MED ORDER — ZOLPIDEM TARTRATE 5 MG PO TABS
5.0000 mg | ORAL_TABLET | Freq: Every evening | ORAL | Status: DC | PRN
Start: 2015-06-16 — End: 2015-06-20
  Administered 2015-06-16 – 2015-06-19 (×3): 5 mg via ORAL
  Filled 2015-06-16 (×4): qty 1

## 2015-06-16 MED ORDER — SODIUM CHLORIDE 0.9% FLUSH: 3.0000 mL | INTRAVENOUS | Status: DC | PRN

## 2015-06-16 MED ORDER — ONDANSETRON HCL 4 MG/2ML IJ SOLN
4.0000 mg | Freq: Four times a day (QID) | INTRAMUSCULAR | Status: DC | PRN
  Administered 2015-06-16 – 2015-06-21 (×3): 4 mg via INTRAVENOUS
  Filled 2015-06-16 (×4): qty 2

## 2015-06-16 MED ORDER — MONTELUKAST SODIUM 10 MG PO TABS
10.0000 mg | ORAL_TABLET | Freq: Every day | ORAL | Status: DC
  Administered 2015-06-17 – 2015-06-19 (×3): 10 mg via ORAL
  Filled 2015-06-16 (×3): qty 1

## 2015-06-16 MED ORDER — INSULIN ASPART 100 UNIT/ML ~~LOC~~ SOLN
0.0000 [IU] | SUBCUTANEOUS | Status: DC
  Administered 2015-06-17: 2 [IU] via SUBCUTANEOUS
  Administered 2015-06-17: 1 [IU] via SUBCUTANEOUS
  Administered 2015-06-17: 2 [IU] via SUBCUTANEOUS
  Administered 2015-06-18: 3 [IU] via SUBCUTANEOUS
  Administered 2015-06-18 – 2015-06-21 (×5): 2 [IU] via SUBCUTANEOUS
  Administered 2015-06-21 (×2): 1 [IU] via SUBCUTANEOUS
  Administered 2015-06-21: 2 [IU] via SUBCUTANEOUS
  Administered 2015-06-22 (×2): 3 [IU] via SUBCUTANEOUS
  Administered 2015-06-22: 2 [IU] via SUBCUTANEOUS
  Administered 2015-06-22 – 2015-06-23 (×4): 1 [IU] via SUBCUTANEOUS
  Administered 2015-06-23: 5 [IU] via SUBCUTANEOUS
  Administered 2015-06-23: 1 [IU] via SUBCUTANEOUS
  Administered 2015-06-23 – 2015-06-25 (×7): 2 [IU] via SUBCUTANEOUS
  Administered 2015-06-25: 1 [IU] via SUBCUTANEOUS

## 2015-06-16 MED ORDER — METOCLOPRAMIDE HCL 5 MG/ML IJ SOLN
5.0000 mg | Freq: Four times a day (QID) | INTRAMUSCULAR | Status: DC | PRN
  Filled 2015-06-16: qty 2

## 2015-06-16 MED ORDER — RENA-VITE PO TABS
1.0000 | ORAL_TABLET | Freq: Every day | ORAL | Status: DC
  Administered 2015-06-17 – 2015-06-19 (×3): 1 via ORAL
  Filled 2015-06-16 (×3): qty 1

## 2015-06-16 MED ORDER — SODIUM CHLORIDE 0.9% FLUSH
3.0000 mL | Freq: Two times a day (BID) | INTRAVENOUS | Status: DC
  Administered 2015-06-22: 3 mL via INTRAVENOUS

## 2015-06-16 MED ORDER — VALACYCLOVIR HCL 500 MG PO TABS
500.0000 mg | ORAL_TABLET | Freq: Every day | ORAL | Status: DC
  Administered 2015-06-17 – 2015-06-20 (×4): 500 mg via ORAL
  Filled 2015-06-16 (×4): qty 1

## 2015-06-16 MED ORDER — PIPERACILLIN-TAZOBACTAM 3.375 G IVPB 30 MIN
3.3750 g | Freq: Once | INTRAVENOUS | Status: AC
  Administered 2015-06-16: 3.375 g via INTRAVENOUS
  Filled 2015-06-16: qty 50

## 2015-06-16 MED ORDER — SEVELAMER CARBONATE 800 MG PO TABS
800.0000 mg | ORAL_TABLET | Freq: Three times a day (TID) | ORAL | Status: DC
  Administered 2015-06-19: 800 mg via ORAL
  Filled 2015-06-16 (×3): qty 1

## 2015-06-16 MED ORDER — CALCITRIOL 0.25 MCG PO CAPS
0.2500 ug | ORAL_CAPSULE | ORAL | Status: DC
  Administered 2015-06-19 – 2015-06-28 (×4): 0.25 ug via ORAL
  Filled 2015-06-16 (×3): qty 1

## 2015-06-16 MED ORDER — ACETAMINOPHEN 650 MG RE SUPP: 650.0000 mg | Freq: Four times a day (QID) | RECTAL | Status: DC | PRN

## 2015-06-16 MED ORDER — HYDROMORPHONE HCL 1 MG/ML IJ SOLN: 0.5000 mg | INTRAMUSCULAR | Status: DC | PRN

## 2015-06-16 MED ORDER — ENSURE ENLIVE PO LIQD
237.0000 mL | Freq: Two times a day (BID) | ORAL | Status: DC
  Administered 2015-06-22: 237 mL via ORAL

## 2015-06-16 MED ORDER — SIMVASTATIN 20 MG PO TABS
10.0000 mg | ORAL_TABLET | Freq: Every day | ORAL | Status: DC
  Administered 2015-06-17: 20 mg via ORAL
  Administered 2015-06-18 – 2015-06-19 (×2): 10 mg via ORAL
  Filled 2015-06-16 (×3): qty 1

## 2015-06-16 MED ORDER — HEPARIN SODIUM (PORCINE) 5000 UNIT/ML IJ SOLN
5000.0000 [IU] | Freq: Three times a day (TID) | INTRAMUSCULAR | Status: DC
  Administered 2015-06-17 – 2015-06-19 (×7): 5000 [IU] via SUBCUTANEOUS
  Filled 2015-06-16 (×4): qty 1

## 2015-06-16 MED ORDER — ONDANSETRON HCL 4 MG PO TABS: 4.0000 mg | ORAL_TABLET | Freq: Four times a day (QID) | ORAL | Status: DC | PRN

## 2015-06-16 MED ORDER — OXYCODONE HCL 5 MG PO TABS: 5.0000 mg | ORAL_TABLET | ORAL | Status: DC | PRN

## 2015-06-16 MED ORDER — ACETAMINOPHEN 325 MG PO TABS: 650.0000 mg | ORAL_TABLET | Freq: Four times a day (QID) | ORAL | Status: DC | PRN

## 2015-06-16 MED ORDER — SODIUM CHLORIDE 0.9 % IV SOLN
1250.0000 mg | Freq: Once | INTRAVENOUS | Status: AC
  Administered 2015-06-16: 1250 mg via INTRAVENOUS
  Filled 2015-06-16: qty 1250

## 2015-06-16 MED ORDER — PANTOPRAZOLE SODIUM 40 MG PO TBEC
40.0000 mg | DELAYED_RELEASE_TABLET | Freq: Two times a day (BID) | ORAL | Status: DC
  Administered 2015-06-17: 40 mg via ORAL
  Filled 2015-06-16: qty 1

## 2015-06-16 MED ORDER — VANCOMYCIN HCL IN DEXTROSE 750-5 MG/150ML-% IV SOLN: 750.0000 mg | INTRAVENOUS | Status: DC

## 2015-06-16 MED ORDER — CHLORPROMAZINE HCL 25 MG PO TABS
25.0000 mg | ORAL_TABLET | Freq: Three times a day (TID) | ORAL | Status: DC | PRN
  Administered 2015-06-17 – 2015-06-18 (×2): 25 mg via ORAL
  Filled 2015-06-16 (×2): qty 1

## 2015-06-16 MED ORDER — PIPERACILLIN-TAZOBACTAM IN DEX 2-0.25 GM/50ML IV SOLN
2.2500 g | Freq: Three times a day (TID) | INTRAVENOUS | Status: DC
  Administered 2015-06-17 – 2015-06-19 (×7): 2.25 g via INTRAVENOUS
  Filled 2015-06-16 (×10): qty 50

## 2015-06-16 MED ORDER — SODIUM CHLORIDE 0.9 % IV SOLN: 250.0000 mL | INTRAVENOUS | Status: DC | PRN

## 2015-06-16 MED ORDER — METOPROLOL TARTRATE 12.5 MG HALF TABLET
12.5000 mg | ORAL_TABLET | Freq: Two times a day (BID) | ORAL | Status: DC
  Administered 2015-06-16 – 2015-06-20 (×7): 12.5 mg via ORAL
  Filled 2015-06-16 (×7): qty 1

## 2015-06-16 NOTE — ED Provider Notes (Signed)
CSN: [id number]     Arrival date & time 06/16/15  1920 History   First MD Initiated Contact with Patient 06/16/15 2034     Chief Complaint  Patient presents with  . Weakness  . Emesis     (Consider location/radiation/quality/duration/timing/severity/associated sxs/prior Treatment) HPI Was admitted to the hospital, now with continuing generalized weakness, inability to eat/tolerate po, vomiting, continuing chronic hiccups, difficulty sleeping.  Had dialysis today.  No longer makes urine.  Mild cough for a few days.  Coughing up mucous white color.  Fever while in the hospital, after going home has had low grade temp 99s.  Still having coffee ground emesis 10-14 times per day.  No headache, runny nose/sore throat.  Denies increased pain at fistula site   Past Medical History  Diagnosis Date  . Diabetes mellitus ([place]) 07/03/2011  . Hypertension 07/03/2011  . Hyperlipidemia 07/03/2011  . Colon polyps 2012  . Gastric ulcer   . Bulging lumbar disc   . Encounter for antineoplastic chemotherapy 03/05/2015  . Anemia   . Blood transfusion without reported diagnosis 2015  . Multiple myeloma 2011    currently undergoing chemo  . Cataract    Past Surgical History  Procedure Laterality Date  . Limbal stem cell transplant    . Humerus fracture surgery  2011    right  . 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  . Cataract extraction w/ intraocular lens implant Bilateral left 3/17, right 4/17  . Av fistula placement Left 06/08/2015    Procedure: LEFT UPPER ARM ARTERIOVENOUS (AV) FISTULA CREATION;  Surgeon: Mal Misty, MD;  Location: [facility] OR;  Service: Vascular;  Laterality: Left;   Family History  Problem Relation Age of Onset  . Diabetes Mother   . Hyperlipidemia Mother   . Diabetes Sister   . Diabetes Brother   . Colon cancer Maternal Uncle 19   Social History  Substance Use Topics  . Smoking status: Never Smoker   . Smokeless tobacco: Never Used  . Alcohol Use: No     Review of Systems  Constitutional: Positive for fever, appetite change and fatigue.  HENT: Negative for sore throat.   Eyes: Negative for visual disturbance.  Respiratory: Positive for shortness of breath.   Cardiovascular: Positive for chest pain.  Gastrointestinal: Positive for nausea, vomiting and constipation. Negative for abdominal pain and diarrhea.  Genitourinary: Negative for difficulty urinating (not making urine).  Musculoskeletal: Negative for back pain and neck stiffness.  Skin: Negative for rash.  Neurological: Negative for syncope and headaches.      Allergies  Review of patient's allergies indicates no known allergies.  Home Medications   Prior to Admission medications   Medication Sig Start Date End Date Taking? Authorizing Provider  calcitRIOL (ROCALTROL) 0.25 MCG capsule Take 1 capsule (0.25 mcg total) by mouth Every Tuesday,Thursday,and Saturday with dialysis. 06/15/15  Yes Orson Eva, MD  chlorproMAZINE (THORAZINE) 25 MG tablet Take 1 tablet (25 mg total) by mouth 3 (three) times daily as needed for hiccoughs. 06/15/15  Yes Orson Eva, MD  feeding supplement, ENSURE ENLIVE, (ENSURE ENLIVE) LIQD Take 237 mLs by mouth 2 (two) times daily between meals. 06/15/15  Yes Orson Eva, MD  glipiZIDE (GLUCOTROL) 5 MG tablet Take 0.5 tablets (2.5 mg total) by mouth daily before breakfast. 06/15/15  Yes Orson Eva, MD  lidocaine-prilocaine (EMLA) cream Apply 1 application topically as needed. Apply one hour before use and cover with plastic wrap. 10/17/14  Yes Adrena  E Johnson, PA-C  metoCLOPramide (REGLAN) 5 MG tablet Take 1 tablet (5 mg total) by mouth 3 (three) times daily before meals. 06/15/15  Yes Orson Eva, MD  metoprolol tartrate (LOPRESSOR) 25 MG tablet Take 0.5 tablets (12.5 mg total) by mouth 2 (two) times daily. 06/15/15  Yes Orson Eva, MD  montelukast (SINGULAIR) 10 MG tablet Take 10 mg by mouth at bedtime.  04/18/15 04/17/16 Yes Historical Provider, MD  multivitamin  (RENA-VIT) TABS tablet Take 1 tablet by mouth at bedtime. 06/15/15  Yes Orson Eva, MD  pantoprazole (PROTONIX) 40 MG tablet TAKE ONE TABLET BY MOUTH TWICE DAILY 02/02/15  Yes Ladene Artist, MD  prochlorperazine (COMPAZINE) 10 MG tablet Take 10 mg by mouth every 6 (six) hours as needed for nausea or vomiting. Reported on 05/14/2015   Yes Historical Provider, MD  sevelamer carbonate (RENVELA) 800 MG tablet Take 1 tablet (800 mg total) by mouth 3 (three) times daily with meals. 06/15/15  Yes Orson Eva, MD  simvastatin (ZOCOR) 10 MG tablet Take 10 mg by mouth at bedtime.     Yes Historical Provider, MD  valACYclovir (VALTREX) 500 MG tablet TAKE ONE CAPLET BY MOUTH ONCE DAILY 01/24/15  Yes Curt Bears, MD  VIAGRA 50 MG tablet Take 50 mg by mouth daily as needed for erectile dysfunction.  02/23/13  Yes Historical Provider, MD  vitamin B-12 500 MCG tablet Take 1 tablet (500 mcg total) by mouth daily. 06/15/15  Yes David Tat, MD   BP 163/64 mmHg  Pulse 117  Temp(Src) 99.8 F (37.7 C) (Oral)  Resp 20  Ht _0  (1.803 m)  Wt 143 lb 12.8 oz (65.227 kg)  BMI 20.06 kg/m2  SpO2 99% Physical Exam  Constitutional: He is oriented to person, place, and time. He appears cachectic. He has a sickly appearance. He appears ill. No distress.  HENT:  Head: Normocephalic and atraumatic.  Eyes: Conjunctivae and EOM are normal.  Neck: Normal range of motion.  Cardiovascular: Regular rhythm, normal heart sounds and intact distal pulses.  Tachycardia present.  Exam reveals no gallop and no friction rub.   No murmur heard. Pulmonary/Chest: Effort normal and breath sounds normal. No respiratory distress. He has no wheezes. He has no rales.  Abdominal: Soft. He exhibits no distension. There is no tenderness. There is no guarding.  Musculoskeletal: He exhibits no edema.  Fistula left upper arm, palpable thrill, incision C/D/I, no surrounding erythema, +contusion and warmth  Neurological: He is alert and oriented to  person, place, and time.  Skin: Skin is warm and dry. He is not diaphoretic.  Port and dialysis cath no signs of surrounding erythema  Nursing note and vitals reviewed.   ED Course  Procedures (including critical care time) Labs Review Labs Reviewed  BASIC METABOLIC PANEL - Abnormal; Notable for the following:    Sodium 133 (*)    Chloride 95 (*)    Glucose, Bld 179 (*)    Creatinine, Ser 3.77 (*)    Calcium 8.1 (*)    GFR calc non Af Amer 15 (*)    GFR calc Af Amer 17 (*)    All other components within normal limits  CBC - Abnormal; Notable for the following:    RBC 3.26 (*)    Hemoglobin 8.6 (*)    HCT 27.1 (*)    RDW 16.0 (*)    Platelets 89 (*)    All other components within normal limits  GLUCOSE, CAPILLARY - Abnormal; Notable for the following:  Glucose-Capillary 177 (*)    All other components within normal limits  CULTURE, BLOOD (ROUTINE X 2)  CULTURE, BLOOD (ROUTINE X 2)  LACTIC ACID, PLASMA  PROCALCITONIN  BASIC METABOLIC PANEL  CBC  LACTIC ACID, PLASMA  I-STAT TROPOININ, ED  I-STAT CG4 LACTIC ACID, ED  I-STAT CG4 LACTIC ACID, ED    Imaging Review Dg Chest 2 View  06/16/2015  CLINICAL DATA:  Chest pain EXAM: CHEST  2 VIEW COMPARISON:  06/12/2015 FINDINGS: Right-sided dialysis catheter and left-sided chest wall port are again seen and stable. Cardiac shadow is stable. The lungs are well aerated bilaterally without focal infiltrate or sizable effusion. Postsurgical changes in the right humerus are seen. IMPRESSION: No acute abnormality noted. Electronically Signed   By: Inez Catalina M.D.   On: 06/16/2015 20:35   I have personally reviewed and evaluated these images and lab results as part of my medical decision-making.   EKG Interpretation   Date/Time:  Saturday Jun 16 2015 19:35:51 EDT Ventricular Rate:  130 PR Interval:  116 QRS Duration: 72 QT Interval:  326 QTC Calculation: 479 R Axis:   42 Text Interpretation:  Sinus tachycardia Possible Left  atrial enlargement  Left ventricular hypertrophy with repolarization abnormality Abnormal ECG  tachycardia new from previous T wave normalization in V2-V3 from most  recent, however similar to old EKGs Confirmed by LITTLE MD, RACHEL [id number])  on 06/16/2015 7:44:47 PM      MDM   Final diagnoses:  Generalized weakness  ESRD on hemodialysis (HCC)  Fever, unspecified fever cause  Tachycardia  Non-intractable vomiting with nausea, vomiting of unspecified type  Dehydration   71 year old male with history of multiple myeloma, hypertension, hyperlipidemia, recent admission for acute on chronic renal failure, initiated on dialysis, with hospital stay complicated by fever, chest pain and shortness of breath with low probability VQ scan, coffee-ground emesis, discharged from the hospital yesterday, presents with concern for continuing generalized weakness, fevers, nausea and vomiting. Patient tachycardic to 120s on arrival to the emergency department with a temperature of 100.4.  HR may be secondary to dehydration vs sepsis.  Patient reports chest pain and shortness of breath have improved since he is in the hospital, given recent low probability VQ scan, have low suspicion for PE at this time. Lactic acid is within normal limits, and unclear source of fever by history and imaging. CXR negative. Pt does not make urine.  Patient was given a liter of normal saline, blood cultures were ordered, and he was empirically given vancomycin and Zosyn. Patient is noted to have warmth around his new fistula site, however no signs of overlying cellulitis. Would consider inpt vascular surg consult for reevaluation for typical post operative changes versus infection.  Patient to be admitted to the hospital for further care.   Gareth Morgan, MD 06/17/15 [phone]

## 2015-06-16 NOTE — Progress Notes (Signed)
Pharmacy Antibiotic Note  Eddie Thomas is a 70 y.o. male admitted on 06/16/2015 with sepsis.  Pharmacy has been consulted for vancomycin dosing. Pt has history of ESRD on HD TTS.  Pt received vancomycin 1250mg  and zosyn 3.375g IV once in the ED.  Plan: Vancomycin 750mg  IV qHD Recommend zosyn 2.25g IV q8h if continued Monitor culture data, HD plans and clinical course VT at SS prn  Height: 5\' 11"  (180.3 cm) Weight: 143 lb 4.8 oz (65 kg) IBW/kg (Calculated) : 75.3  Temp (24hrs), Avg:100.4 F (38 C), Min:100.4 F (38 C), Max:100.4 F (38 C)   Recent Labs Lab 06/12/15 0832 06/13/15 0500 06/14/15 0442 06/14/15 0649 06/15/15 0520 06/16/15 1934 06/16/15 1955  WBC  --  4.8 3.6* 4.3 4.3 4.5  --   CREATININE 10.04* 5.50*  --  8.06*  --  3.77*  --   LATICACIDVEN  --   --   --   --   --   --  0.99    Estimated Creatinine Clearance: 17 mL/min (by C-G formula based on Cr of 3.77).    No Known Allergies  Antimicrobials this admission: Vanc 5/27 >>  Zosyn 5/27 >>   Dose adjustments this admission: n/a  Microbiology results:  BCx:   UCx:    Sputum:    MRSA PCR:    Andrey Cota. Diona Foley, PharmD, [place] Clinical Pharmacist Pager [phone] 06/16/2015 10:04 PM

## 2015-06-16 NOTE — ED Notes (Signed)
Pt from home, family reports weakness and n/v that started since he left hospital on Thursday. Pt also c/o of mild pressure in center of chest.

## 2015-06-16 NOTE — ED Notes (Signed)
Pt has left chest port. Requested we access it instead of attempting IV.

## 2015-06-16 NOTE — ED Notes (Signed)
Phlebotomy at bedside.

## 2015-06-16 NOTE — H&P (Signed)
[facility] Admission History and Physical       Eddie Thomas HKV:[id number] DOB: December 26, 1945 DOA: 06/16/2015  Referring physician: EDP PCP: [name]  Specialists:   Chief Complaint: Fevers and Chills Weakness  HPI: Eddie Thomas is a 70 y.o. 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 Julien Nordmann of medical oncology) who presents to the ED with complaints of  Fevers and chills and Nausea and Vomiting since discharge to home yesterday.  He has not been able to hold down any food liquids or meds.    He was evaluated in the ED and a Sepsis Workup was initiated and he was placed on IV Vancomycin  and Zosyn and referred for admission     Review of Systems:  Constitutional: No Weight Loss, No Weight Gain, Night Sweats, +Fevers, +Chills, Dizziness, Light Headedness, Fatigue, or +Generalized Weakness HEENT: No Headaches, Difficulty Swallowing,Tooth/Dental Problems,Sore Throat,  No Sneezing, Rhinitis, Ear Ache, Nasal Congestion, or Post Nasal Drip,  Cardio-vascular:  No Chest pain, Orthopnea, PND, Edema in Lower Extremities, Anasarca, Dizziness, Palpitations  Resp: No Dyspnea, No DOE, No Productive Cough, No Non-Productive Cough, No Hemoptysis, No Wheezing.    GI: No Heartburn, Indigestion, Abdominal Pain, +Nausea, +Vomiting, Diarrhea, Constipation, Hematemesis, Hematochezia, Melena, Change in Bowel Habits,  Loss of Appetite  GU: No Dysuria, No Change in Color of Urine, No Urgency or Urinary Frequency, No Flank pain.  Musculoskeletal: No Joint Pain or Swelling, No Decreased Range of Motion, No Back Pain.  Neurologic: No Syncope, No Seizures, Muscle Weakness, Paresthesia, Vision Disturbance or Loss, No Diplopia, No Vertigo, No Difficulty Walking,  Skin: No Rash or Lesions. Psych: No Change in Mood or Affect, No Depression or Anxiety, No  Memory loss, No Confusion, or Hallucinations   Past Medical History  Diagnosis Date  . Diabetes mellitus ([place]) 07/03/2011  . Hypertension 07/03/2011  . Hyperlipidemia 07/03/2011  . Colon polyps 2012  . Gastric ulcer   . Bulging lumbar disc   . Encounter for antineoplastic chemotherapy 03/05/2015  . Anemia   . Blood transfusion without reported diagnosis 2015  . Multiple myeloma 2011    currently undergoing chemo  . Cataract      Past Surgical History  Procedure Laterality Date  . Limbal stem cell transplant    . Humerus fracture surgery  2011    right  . 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  . Cataract extraction w/ intraocular lens implant Bilateral left 3/17, right 4/17  . Av fistula placement Left 06/08/2015    Procedure: LEFT UPPER ARM ARTERIOVENOUS (AV) FISTULA CREATION;  Surgeon: Mal Misty, MD;  Location: [place];  Service: Vascular;  Laterality: Left;      Prior to Admission medications   Medication Sig Start Date End Date Taking? Authorizing Provider  calcitRIOL (ROCALTROL) 0.25 MCG capsule Take 1 capsule (0.25 mcg total) by mouth Every Tuesday,Thursday,and Saturday with dialysis. 06/15/15  Yes Orson Eva, MD  chlorproMAZINE (THORAZINE) 25 MG tablet Take 1 tablet (25 mg total) by mouth 3 (three) times daily as needed for hiccoughs. 06/15/15  Yes Orson Eva, MD  feeding supplement, ENSURE ENLIVE, (ENSURE ENLIVE) LIQD Take 237 mLs by mouth 2 (two) times daily between meals. 06/15/15  Yes Orson Eva, MD  glipiZIDE (GLUCOTROL) 5 MG tablet Take 0.5 tablets (2.5 mg total) by mouth daily before breakfast. 06/15/15  Yes Orson Eva, MD  lidocaine-prilocaine (EMLA) cream Apply 1 application topically as needed. Apply one hour before use and cover with plastic wrap. 10/17/14  Yes Adrena E Johnson, PA-C  metoCLOPramide (REGLAN) 5 MG tablet Take 1 tablet (5 mg total) by mouth 3 (three) times daily before meals. 06/15/15  Yes Orson Eva, MD  metoprolol tartrate (LOPRESSOR)  25 MG tablet Take 0.5 tablets (12.5 mg total) by mouth 2 (two) times daily. 06/15/15  Yes Orson Eva, MD  montelukast (SINGULAIR) 10 MG tablet Take 10 mg by mouth at bedtime.  04/18/15 04/17/16 Yes Historical Provider, MD  multivitamin (RENA-VIT) TABS tablet Take 1 tablet by mouth at bedtime. 06/15/15  Yes Orson Eva, MD  pantoprazole (PROTONIX) 40 MG tablet TAKE ONE TABLET BY MOUTH TWICE DAILY 02/02/15  Yes Ladene Artist, MD  prochlorperazine (COMPAZINE) 10 MG tablet Take 10 mg by mouth every 6 (six) hours as needed for nausea or vomiting. Reported on 05/14/2015   Yes Historical Provider, MD  sevelamer carbonate (RENVELA) 800 MG tablet Take 1 tablet (800 mg total) by mouth 3 (three) times daily with meals. 06/15/15  Yes Orson Eva, MD  simvastatin (ZOCOR) 10 MG tablet Take 10 mg by mouth at bedtime.     Yes Historical Provider, MD  valACYclovir (VALTREX) 500 MG tablet TAKE ONE CAPLET BY MOUTH ONCE DAILY 01/24/15  Yes Curt Bears, MD  VIAGRA 50 MG tablet Take 50 mg by mouth daily as needed for erectile dysfunction.  02/23/13  Yes Historical Provider, MD  vitamin B-12 500 MCG tablet Take 1 tablet (500 mcg total) by mouth daily. 06/15/15  Yes Orson Eva, MD     No Known Allergies  Social History:  reports that he has never smoked. He has never used smokeless tobacco. He reports that he does not drink alcohol or use illicit drugs.    Family History  Problem Relation Age of Onset  . Diabetes Mother   . Hyperlipidemia Mother   . Diabetes Sister   . Diabetes Brother   . Colon cancer Maternal Uncle 80       Physical Exam:  GEN:  Pleasant Thin ill Appearing 70 y.o. African American male examined and in no acute distress; cooperative with exam Filed Vitals:   06/16/15 1928 06/16/15 2100 06/16/15 2130  BP: 121/57 145/72 143/73  Pulse: 128 120 118  Temp: 100.4 F (38 C)    TempSrc: Oral    Resp: 22 28 24   Height: 5' 11"  (1.803 m)    Weight: 65 kg (143 lb 4.8 oz)    SpO2: 100% 100% 100%   Blood  pressure 143/73, pulse 118, temperature 100.4 F (38 C), temperature source Oral, resp. rate 24, height 5' 11"  (1.803 m), weight 65 kg (143 lb 4.8 oz), SpO2 100 %. PSYCH: He is alert and oriented x4; does not appear anxious does not appear depressed; affect is normal HEENT: Normocephalic and Atraumatic, Mucous membranes pink; PERRLA; EOM intact; Fundi:  Benign;  No scleral icterus, Nares: Patent, Oropharynx: Clear, Fair Dentition,    Neck:  FROM, No Cervical Lymphadenopathy nor Thyromegaly or Carotid Bruit; No JVD; Breasts:: Not examined CHEST WALL: No tenderness CHEST: Normal respiration, clear to auscultation bilaterally HEART: Regular rate and rhythm; no murmurs rubs or gallops BACK: No kyphosis or scoliosis; No CVA tenderness ABDOMEN: Positive Bowel Sounds, Soft Non-Tender, No Rebound or Guarding; No Masses, No Organomegaly. Rectal Exam: Not done EXTREMITIES: No Cyanosis, Clubbing, or Edema; No Ulcerations. Genitalia: not examined PULSES: 2+ and symmetric SKIN: Normal hydration no  rash or ulceration CNS:  Alert and Oriented x 4, No Focal Deficits Vascular: pulses palpable throughout    Labs on Admission:  Basic Metabolic Panel:  Recent Labs Lab 06/12/15 0832 06/13/15 0500 06/14/15 0649 06/16/15 1934  NA 140 137 137 133*  K 4.3 4.1 3.8 4.5  CL 96* 97* 97* 95*  CO2 26 24 23 24   GLUCOSE 161* 139* 181* 179*  BUN 76* 35* 57* 15  CREATININE 10.04* 5.50* 8.06* 3.77*  CALCIUM 8.7* 8.0* 8.7* 8.1*  PHOS 5.4*  --  6.3*  --    Liver Function Tests:  Recent Labs Lab 06/12/15 0832 06/14/15 0649  ALBUMIN 2.3* 2.5*   No results for input(s): LIPASE, AMYLASE in the last 168 hours.  Recent Labs Lab 06/13/15 1135  AMMONIA 24   CBC:  Recent Labs Lab 06/13/15 0500 06/14/15 0442 06/14/15 0649 06/15/15 0520 06/16/15 1934  WBC 4.8 3.6* 4.3 4.3 4.5  HGB 8.9* 9.0* 9.3* 8.3* 8.6*  HCT 27.9* 27.3* 28.6* 26.4* 27.1*  MCV 83.0 80.1 81.0 82.0 83.1  PLT 82* 82* 82* 82* 89*    Cardiac Enzymes:  Recent Labs Lab 06/14/15 1900 06/15/15 0520 06/15/15 1026  TROPONINI <0.03 0.04* 0.03    BNP (last 3 results) No results for input(s): BNP in the last 8760 hours.  ProBNP (last 3 results) No results for input(s): PROBNP in the last 8760 hours.  CBG:  Recent Labs Lab 06/14/15 1109 06/14/15 1649 06/14/15 2003 06/15/15 0737 06/15/15 1130  GLUCAP 148* 225* 150* 179* 306*    Radiological Exams on Admission: Dg Chest 2 View  06/16/2015  CLINICAL DATA:  Chest pain EXAM: CHEST  2 VIEW COMPARISON:  06/12/2015 FINDINGS: Right-sided dialysis catheter and left-sided chest wall port are again seen and stable. Cardiac shadow is stable. The lungs are well aerated bilaterally without focal infiltrate or sizable effusion. Postsurgical changes in the right humerus are seen. IMPRESSION: No acute abnormality noted. Electronically Signed   By: Inez Catalina M.D.   On: 06/16/2015 20:35     EKG: Independently reviewed.     Assessment/Plan:      70 y.o. male with  Principal Problem:    SIRS (systemic inflammatory response syndrome) (HCC)   Sepsis Workup   IV Vancomycin and Zosyn   Active Problems:    Generalized weakness- due to acute and chronic Illness   Supportive Care      Nausea and vomiting   IV Reglan   IV Zofran     Multiple myeloma (HCC)   Notify Oncology Dr Earlie Server      Diabetes mellitus ([place])   SSI coverage PRN        Hypoalbuminemia due to protein-calorie malnutrition [facility])   Nutrition Consult      ESRD on hemodialysis ([place])   Notify Dialysis Team and Nephrology in AM    DVT Prophylaxis   SQ Heparin       Code Status:     Do Not Resuscitate (DNR)        Family Communication:   Wife and Daughter at Bedside     Disposition Plan:    Inpatient Status with Expected LOS 3-4 days  Time spent: [place] [facility] Pager [phone]   If [place] Please Contact the Day Rounding Team MD for [facility]  If 7PM-7AM, Please Contact Night-Floor Coverage  [email or web address] Password [facility] 06/16/2015, 10:17 PM     ADDENDUM:   Patient was seen and  examined on 06/16/2015

## 2015-06-17 DIAGNOSIS — J690 Pneumonitis due to inhalation of food and vomit: Secondary | ICD-10-CM

## 2015-06-17 DIAGNOSIS — R509 Fever, unspecified: Secondary | ICD-10-CM | POA: Insufficient documentation

## 2015-06-17 DIAGNOSIS — R1110 Vomiting, unspecified: Secondary | ICD-10-CM

## 2015-06-17 LAB — PROCALCITONIN: PROCALCITONIN: 1.63 ng/mL

## 2015-06-17 LAB — BASIC METABOLIC PANEL
Anion gap: 11 (ref 5–15)
BUN: 20 mg/dL (ref 6–20)
CALCIUM: 7.8 mg/dL — AB (ref 8.9–10.3)
CHLORIDE: 97 mmol/L — AB (ref 101–111)
CO2: 26 mmol/L (ref 22–32)
CREATININE: 4.69 mg/dL — AB (ref 0.61–1.24)
GFR calc non Af Amer: 12 mL/min — ABNORMAL LOW (ref >60)
GFR, EST AFRICAN AMERICAN: 13 mL/min — AB (ref >60)
Glucose, Bld: 141 mg/dL — ABNORMAL HIGH (ref 65–99)
Potassium: 4.7 mmol/L (ref 3.5–5.1)
SODIUM: 134 mmol/L — AB (ref 135–145)

## 2015-06-17 LAB — GLUCOSE, CAPILLARY
GLUCOSE-CAPILLARY: 157 mg/dL — AB (ref 65–99)
GLUCOSE-CAPILLARY: 196 mg/dL — AB (ref 65–99)
GLUCOSE-CAPILLARY: 108 mg/dL — AB (ref 65–99)
Glucose-Capillary: 136 mg/dL — ABNORMAL HIGH (ref 65–99)
Glucose-Capillary: 150 mg/dL — ABNORMAL HIGH (ref 65–99)

## 2015-06-17 LAB — LACTIC ACID, PLASMA
LACTIC ACID, VENOUS: 0.6 mmol/L (ref 0.5–2.0)
Lactic Acid, Venous: 0.7 mmol/L (ref 0.5–2.0)

## 2015-06-17 LAB — CBC
HCT: 25.1 % — ABNORMAL LOW (ref 39.0–52.0)
Hemoglobin: 7.9 g/dL — ABNORMAL LOW (ref 13.0–17.0)
MCH: 26.3 pg (ref 26.0–34.0)
MCHC: 31.5 g/dL (ref 30.0–36.0)
MCV: 83.7 fL (ref 78.0–100.0)
PLATELETS: 72 10*3/uL — AB (ref 150–400)
RBC: 3 MIL/uL — ABNORMAL LOW (ref 4.22–5.81)
RDW: 16.2 % — AB (ref 11.5–15.5)
WBC: 4.2 10*3/uL (ref 4.0–10.5)

## 2015-06-17 LAB — CULTURE, BLOOD (ROUTINE X 2)
CULTURE: NO GROWTH
Culture: NO GROWTH

## 2015-06-17 MED ORDER — SODIUM CHLORIDE 0.9% FLUSH
10.0000 mL | INTRAVENOUS | Status: DC | PRN
  Administered 2015-06-18 – 2015-06-21 (×2): 10 mL
  Administered 2015-06-24: 20 mL
  Administered 2015-06-26 – 2015-06-28 (×2): 10 mL
  Filled 2015-06-17 (×5): qty 40

## 2015-06-17 MED ORDER — METOCLOPRAMIDE HCL 5 MG/ML IJ SOLN
5.0000 mg | Freq: Four times a day (QID) | INTRAMUSCULAR | Status: DC
  Administered 2015-06-17 (×2): 5 mg via INTRAVENOUS
  Administered 2015-06-17: 10 mg via INTRAVENOUS
  Administered 2015-06-18 – 2015-06-19 (×4): 5 mg via INTRAVENOUS
  Administered 2015-06-19: 10 mg via INTRAVENOUS
  Administered 2015-06-20 – 2015-06-21 (×5): 5 mg via INTRAVENOUS
  Filled 2015-06-17 (×13): qty 2

## 2015-06-17 MED ORDER — PANTOPRAZOLE SODIUM 40 MG IV SOLR
40.0000 mg | Freq: Two times a day (BID) | INTRAVENOUS | Status: DC
  Administered 2015-06-17 – 2015-06-18 (×3): 40 mg via INTRAVENOUS
  Filled 2015-06-17 (×3): qty 40

## 2015-06-17 NOTE — Consult Note (Addendum)
Reason for Consult: Continuity of ESRD care Referring Physician: Orson Eva M.D. [facility])  HPI:  70 year old African-American man with history of progressive renal failure to end-stage renal disease possibly from combination of underlying diabetes, hypertension and IgA subtype multiple myeloma-recently started on dialysis about 2 weeks ago. He went to his outpatient dialysis unit yesterday for the 1st time and had an uneventful treatment. After dialysis, he developed a fever of 101F with associated nausea and vomiting and was admitted for sepsis evaluation with tachycardia and low blood pressures. He is presumed to have had aspiration pneumonitis given recurrent episodes of hiccups and vomiting.  He is on chemotherapy with Velcade/Daratumumab for his multiple myeloma under the care of Dr. Julien Nordmann.  Dialysis prescription: Tuesday/Thursday/Saturday, 4 hours, 180 dialyzer, blood flow rate 400, dialysate flow 800, EDW 62.5 kg, 3K/2.25 calcium, no UF profile, no sodium modeling. No heparin. Hectorol 1 g 3 times a week, Mircera 200 g IV every 2 weeks. Tunneled RIJ dialysis catheter with maturing left BCF.  Past Medical History  Diagnosis Date  . Diabetes mellitus ([place]) 07/03/2011  . Hypertension 07/03/2011  . Hyperlipidemia 07/03/2011  . Colon polyps 2012  . Gastric ulcer   . Bulging lumbar disc   . Encounter for antineoplastic chemotherapy 03/05/2015  . Anemia   . Blood transfusion without reported diagnosis 2015  . Multiple myeloma 2011    currently undergoing chemo  . Cataract     Past Surgical History  Procedure Laterality Date  . Limbal stem cell transplant    . Humerus fracture surgery  2011    right  . 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  . Cataract extraction w/ intraocular lens implant Bilateral left 3/17, right 4/17  . Av fistula placement Left 06/08/2015    Procedure: LEFT UPPER ARM ARTERIOVENOUS (AV) FISTULA CREATION;  Surgeon: Mal Misty, MD;  Location:  [facility] OR;  Service: Vascular;  Laterality: Left;    Family History  Problem Relation Age of Onset  . Diabetes Mother   . Hyperlipidemia Mother   . Diabetes Sister   . Diabetes Brother   . Colon cancer Maternal Uncle 47    Social History:  reports that he has never smoked. He has never used smokeless tobacco. He reports that he does not drink alcohol or use illicit drugs.  Allergies: No Known Allergies  Medications:  Scheduled: . [START ON 06/19/2015] calcitRIOL  0.25 mcg Oral Q T,Th,Sa-HD  . feeding supplement (ENSURE ENLIVE)  237 mL Oral BID BM  . heparin  5,000 Units Subcutaneous Q8H  . insulin aspart  0-9 Units Subcutaneous Q4H  . metoCLOPramide (REGLAN) injection  5 mg Intravenous Q6H  . metoprolol tartrate  12.5 mg Oral BID  . montelukast  10 mg Oral QHS  . multivitamin  1 tablet Oral QHS  . pantoprazole (PROTONIX) IV  40 mg Intravenous Q12H  . piperacillin-tazobactam (ZOSYN)  IV  2.25 g Intravenous Q8H  . sevelamer carbonate  800 mg Oral TID WC  . simvastatin  10 mg Oral QHS  . sodium chloride flush  3 mL Intravenous Q12H  . sodium chloride flush  3 mL Intravenous Q12H  . valACYclovir  500 mg Oral Daily  . [START ON 06/19/2015] vancomycin  750 mg Intravenous Q T,Th,Sa-HD    BMP Latest Ref Rng 06/17/2015 06/16/2015 06/14/2015  Glucose 65 - 99 mg/dL 141(H) 179(H) 181(H)  BUN 6 - 20 mg/dL 20 15 57(H)  Creatinine 0.61 - 1.24 mg/dL 4.69(H)  3.77(H) 8.06(H)  Sodium 135 - 145 mmol/L 134(L) 133(L) 137  Potassium 3.5 - 5.1 mmol/L 4.7 4.5 3.8  Chloride 101 - 111 mmol/L 97(L) 95(L) 97(L)  CO2 22 - 32 mmol/L 26 24 23   Calcium 8.9 - 10.3 mg/dL 7.8(L) 8.1(L) 8.7(L)   CBC Latest Ref Rng 06/17/2015 06/16/2015 06/15/2015  WBC 4.0 - 10.5 K/uL 4.2 4.5 4.3  Hemoglobin 13.0 - 17.0 g/dL 7.9(L) 8.6(L) 8.3(L)  Hematocrit 39.0 - 52.0 % 25.1(L) 27.1(L) 26.4(L)  Platelets 150 - 400 K/uL 72(L) 89(L) 82(L)      Dg Chest 2 View  06/16/2015  CLINICAL DATA:  Chest pain EXAM: CHEST  2 VIEW  COMPARISON:  06/12/2015 FINDINGS: Right-sided dialysis catheter and left-sided chest wall port are again seen and stable. Cardiac shadow is stable. The lungs are well aerated bilaterally without focal infiltrate or sizable effusion. Postsurgical changes in the right humerus are seen. IMPRESSION: No acute abnormality noted. Electronically Signed   By: Inez Catalina M.D.   On: 06/16/2015 20:35    Review of Systems  Constitutional: Positive for fever, chills and malaise/fatigue.  HENT: Negative for congestion, ear discharge and nosebleeds.   Eyes: Negative.   Respiratory: Negative.   Cardiovascular: Negative.   Gastrointestinal: Positive for nausea, vomiting and abdominal pain. Negative for diarrhea.  Genitourinary: Negative.   Musculoskeletal: Negative.   Skin: Negative.   Neurological: Positive for weakness and headaches.   Blood pressure 128/60, pulse 107, temperature 99.1 F (37.3 C), temperature source Oral, resp. rate 18, height 5' 11"  (1.803 m), weight 65.227 kg (143 lb 12.8 oz), SpO2 98 %. Physical Exam  Nursing note and vitals reviewed. Constitutional: He is oriented to person, place, and time. No distress.  Slender/weak appearing  HENT:  Head: Normocephalic and atraumatic.  Nose: Nose normal.  Eyes: EOM are normal. Pupils are equal, round, and reactive to light. No scleral icterus.  Neck: Normal range of motion. Neck supple. No JVD present.  Cardiovascular: Regular rhythm.  Exam reveals no friction rub.   Murmur heard. Regular tachycardia with 3/6 ejection systolic murmur  Respiratory: Breath sounds normal. No respiratory distress. He has no wheezes. He has no rales.  Tunneled right IJ dialysis catheter  GI: Soft. Bowel sounds are normal. There is tenderness.  Left upper quadrant tenderness  Musculoskeletal: He exhibits no edema.  Maturing left brachiocephalic fistula  Neurological: He is alert and oriented to person, place, and time.  Skin: Skin is warm and dry.     Assessment/Plan: 1. Severe inflammatory response syndrome: Presenting with fever, tachycardia and relative hypotension. Ongoing workup and on broad-spectrum antimicrobial therapy with vancomycin and Zosyn without clear source of infection at this time. Presumed to have had some aspiration pneumonitis. Chest x-ray does not show any consolidation or infiltrate. Blood cultures pending. 2. End-stage renal disease: Status post hemodialysis yesterday with relatively stable appearing labs and no acute need for dialysis today. Plan for next hemodialysis again on Tuesday-usual outpatient schedule 3. Anemia: Appears to be chronic and associated with his underlying multiple myeloma and advanced chronic kidney disease. Continue monitoring for overt losses/ESA adjustment. Iron stores appear to be replete. 4. Metabolic bone disease: On calcitriol for PTH suppression and Renvela for phosphorus binding. 5. Protein calorie malnutrition: Secondary to progressive chronic kidney disease/myeloma and recent hospitalization. Continue oral nutritional supplements-caloric intake likely inhibited by his recurrent nausea, vomiting and hiccups. 6. Multiple myeloma: Primary service has alerted Dr. Julien Nordmann of his admission.   Conway Fedora K. 06/17/2015, 12:12 PM

## 2015-06-17 NOTE — Progress Notes (Signed)
PROGRESS NOTE  Eddie Thomas AUQ:[id number] DOB: Sep 24, 1945 DOA: 06/16/2015 PCP: [name]  Brief History:  70 y.o. male with medical history significant of multiple myeloma, IgA subtype, currently undergoing treatment with Velcade/Daratumumab at the cancer center, follows Dr. Julien Nordmann of medical oncology, also having a history of chronic kidney disease (with baseline creatinine near 1.6-2.0), hypertension, diabetes mellitus, presented as a transfer from the cancer center for acute renal failure with creatinine of 10 on 06/01/15.  Nephrology was consulted, and the patient was initiated on dialysis during his last hospitalization. A tunnel dialysis catheter and AVF was placed by [facility].  He was discharged in stable condition on 06/15/15. Unfortunately, the patient developed a fever up to 101.36F with associated nausea and vomiting after his dialysis session on 06/15/2015. After admission, the patient has had a temperature of 100.103F with tachycardia and soft blood pressures. Nephrology has been consulted to assist with management.  Summary of previous hospitalization--Intractable hiccups- unclear etiology. ABLA complicating chronic anemia > multiple antibodies on type and screen- plan for 2 PRBC's on 5/23. Acute AMS developed on 06/12/15 which was likely multifactorial including medications and possible new infection as pt had a fever up to 101.6. Since then, his mental status has improved and returned back to baseline. On 06/14/15, pt c/o of cp and sob. With his hx of DVT, V/QScan was obtained and it was negative. In addition, the patient had coffee grounds emesis during hospitalization. He was evaluated by [facility]. They did not feel the patient needed an additional EGD at this time as he has had recent EGD on 05/16/2015. His PPI was increased to twice a day, and Reglan was added.  Assessment/Plan: SIRS -presents with fever and tachycardia -Suspect aspiration pneumonitis given  the patient's recurrent hiccups and vomiting -Continue empiric antibiotics pending culture data -Lactic acid 0.99 -Procalcitonin 1.63  Acute renal failure complicating chronic kidney disease-->ESRD - Nephrology was consulted. As per nephrology, creatinine started to rise in March-April 2017 up to 1.4-1.8 range with some bumps up to 2.3-2.5. On May 8, creatinine was 3.3.  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 Patient's myeloma is refractory to multiple different chemotherapy combinations and do not believe that his chemotherapy or pheresis is going to help his renal function. - Nephrology suggested palliative care consultation, to be pursued by oncology as outpatient. - Patient undergoing hemodialysis per nephrology. Last HD on 5/22. [facility] and AVF placed by [facility]. HD initiated by nephrology. Outpatient TTS dialysis >has been arranged by nephrology.  Acute encephalopathy -developed between 5/22 night and 5/23 AM -likely multifactorial including infection, meds, worsen anemia -new fever on 06/12/15 of 101.6--blood cultures negative at the time of discharge -06/12/15 CT brain negative -06/13/15--mental status improving -Discontinued Thorazine, baclofen, opioids, gabapentin, other hypnotics -check ammonia--24  -B12--266 -TSH--0.493, Free T4--1.04 -with B12 in low normal range-->started supplementation -Mental status presently at baseline  Intractable hiccups, intermittent vomiting and coffee grounds emesis - Briefly started on Ativan without significant improvement. Then was changed to baclofen (received 3 doses)-this was stopped on 5/22 due to concern for side effects in renal failure. Started on Thorazine 5/22. All meds stopped due to altered mental status  -Intermittent hiccups continue since discontinuation of hypnotic medications -After his mental status improved, the patient was restarted back on po Thorazine with some improvement -[facility] was consulted. They did not feel the patient needed  any additional procedures at this time. PPI was increased to twice a day and  Reglan was added. The patient will follow-up with Dr. Fuller Plan in the outpatient setting.  Atypical Chest Pain -developed 06/14/15 -with hx of DVT left leg, myeloma and new tachycardia-->VQ scan neg -EKG-no concerning ischemic changes -cycle troponins--unremarkable; minimal elevation likely due to ESRD -suspect GERD/esophagitis from vomiting -try tums prn  Multiple Myeloma: - Follows with Dr Julien Nordmann.  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 - Transfuse 2 units of PRBC 5/23 and follow posttransfusion hemoglobin. Blood bank called and stated patient has multiple antibodies related to his myeloma medications but director of blood bank has cleared the available blood for transfusion. Verified same with oncologist who has had similar experience in the past. -total 5 units received during previous admission  Pancytopenia - Secondary to multiple myeloma and related cancer chemotherapy. - Anemia management as above. Thrombocytopenia stable.  NSVT: - Patient had 15 beats of nonsustained V. tach, hypomagnesemia corrected, 2-D echo done 11/2015 with EF 55-60% - Continue with telemetry monitor - May be related to renal failure, electrolyte abnormalities. Repeat 2-D echo: Normal EF. -Continue metoprolol 12.5 mg twice a day  History of DVT - Xarelto DC'ed given renal function (took Xarelto approximately one year) - Discussed with Dr. Julien Nordmann on 5/17 and he had recommended continued anticoagulation. Attempted IV heparin bridging and Coumadin but each time IV heparin was initiated, patient had bleeding from [facility] site and hence all anticoagulation was stopped. Not good anticoagulation candidate due to bleeding complications. Discussed with Dr. Julien Nordmann on 5/21.  Essential hypertension - ACEI stopped due to acute  renal failure.  -Blood pressure was controlled, but gradually increased during hospitalization -Continue low-dose metoprolol tartrate 12.5 mg twice a day  Diabetes mellitus type 2 -CBGs remained largely controlled -Discontinue Actos -was sent home with low-dose glipizide last admit -novolog sliding scale for now  Disposition Plan:   Home in 2-3 days  Family Communication:  No Family at beside  Consultants:  Renal  Code Status:  FULL    Subjective: Patient is feeling better this morning but still having intermittent headache. He had one episode of emesis without any coffee grounds or blood. Denies any fevers, chills, headache, neck pain, chest pain, shortness breath, hematochezia, melena. No abdominal pain.  Objective: Filed Vitals:   06/16/15 2300 06/16/15 2326 06/17/15 0442 06/17/15 0812  BP: 163/69 163/64 115/56 128/60  Pulse: 116 117 96 107  Temp:  99.8 F (37.7 C) 100.4 F (38 C) 99.1 F (37.3 C)  TempSrc:  Oral Oral Oral  Resp: 21 20 18 18   Height:  5' 11"  (1.803 m)    Weight:  65.227 kg (143 lb 12.8 oz)    SpO2: 99%  98% 98%    Intake/Output Summary (Last 24 hours) at 06/17/15 1052 Last data filed at 06/17/15 0936  Gross per 24 hour  Intake    240 ml  Output      0 ml  Net    240 ml   Weight change:  Exam:   General:  Pt is alert, follows commands appropriately, not in acute distress  HEENT: No icterus, No thrush, No neck mass, [place]/AT  Cardiovascular: RRR, S1/S2, no rubs, no gallops  Respiratory: Bibasilar crackles without wheezing.  Abdomen: Soft/+BS, non tender, non distended, no guarding  Extremities: No edema, No lymphangitis, No petechiae, No rashes, no synovitis   Data Reviewed: I have personally reviewed following labs and imaging studies Basic  Metabolic Panel:  Recent Labs Lab 06/12/15 0832 06/13/15 0500 06/14/15 0649 06/16/15 1934 06/17/15 0245  NA 140 137 137 133* 134*  K 4.3 4.1 3.8 4.5 4.7  CL 96* 97* 97* 95* 97*  CO2 26 24  23 24 26   GLUCOSE 161* 139* 181* 179* 141*  BUN 76* 35* 57* 15 20  CREATININE 10.04* 5.50* 8.06* 3.77* 4.69*  CALCIUM 8.7* 8.0* 8.7* 8.1* 7.8*  PHOS 5.4*  --  6.3*  --   --    Liver Function Tests:  Recent Labs Lab 06/12/15 0832 06/14/15 0649  ALBUMIN 2.3* 2.5*   No results for input(s): LIPASE, AMYLASE in the last 168 hours.  Recent Labs Lab 06/13/15 1135  AMMONIA 24   Coagulation Profile:  Recent Labs Lab 06/11/15 0432 06/12/15 0420 06/13/15 0500 06/14/15 0442 06/15/15 0520  INR 2.14* 1.89* 1.77* 1.47 1.44   CBC:  Recent Labs Lab 06/14/15 0442 06/14/15 0649 06/15/15 0520 06/16/15 1934 06/17/15 0245  WBC 3.6* 4.3 4.3 4.5 4.2  HGB 9.0* 9.3* 8.3* 8.6* 7.9*  HCT 27.3* 28.6* 26.4* 27.1* 25.1*  MCV 80.1 81.0 82.0 83.1 83.7  PLT 82* 82* 82* 89* 72*   Cardiac Enzymes:  Recent Labs Lab 06/14/15 1900 06/15/15 0520 06/15/15 1026  TROPONINI <0.03 0.04* 0.03   BNP: Invalid input(s): POCBNP CBG:  Recent Labs Lab 06/15/15 0737 06/15/15 1130 06/16/15 2317 06/17/15 0444 06/17/15 0747  GLUCAP 179* 306* 177* 136* 150*   HbA1C: No results for input(s): HGBA1C in the last 72 hours. Urine analysis:    Component Value Date/Time   COLORURINE YELLOW 06/01/2015 [place] 06/01/2015 1540   LABSPEC 1.008 06/01/2015 1540   PHURINE 6.0 06/01/2015 1540   GLUCOSEU NEGATIVE 06/01/2015 1540   HGBUR TRACE* 06/01/2015 1540   BILIRUBINUR NEGATIVE 06/01/2015 1540   KETONESUR NEGATIVE 06/01/2015 1540   PROTEINUR 30* 06/01/2015 1540   UROBILINOGEN 1.0 01/16/2012 1902   NITRITE NEGATIVE 06/01/2015 1540   LEUKOCYTESUR NEGATIVE 06/01/2015 1540   Sepsis Labs: @LABRCNTIP (procalcitonin:4,lacticidven:4) ) Recent Results (from the past 240 hour(s))  Surgical PCR screen     Status: None   Collection Time: 06/08/15  9:58 AM  Result Value Ref Range Status   MRSA, PCR NEGATIVE NEGATIVE Final   Staphylococcus aureus NEGATIVE NEGATIVE Final    Comment:         The Xpert SA Assay (FDA approved for NASAL specimens in patients over 20 years of age), is one component of a comprehensive surveillance program.  Test performance has been validated by [facility] for patients greater than or equal to 106 year old. It is not intended to diagnose infection nor to guide or monitor treatment.   Culture, blood (Routine X 2) w Reflex to ID Panel     Status: None (Preliminary result)   Collection Time: 06/12/15  8:58 PM  Result Value Ref Range Status   Specimen Description RIGHT ANTECUBITAL  Final   Special Requests IN BOTH AEROBIC AND ANAEROBIC BOTTLES 5CC  Final   Culture NO GROWTH 4 DAYS  Final   Report Status PENDING  Incomplete  Culture, blood (Routine X 2) w Reflex to ID Panel     Status: None (Preliminary result)   Collection Time: 06/12/15  9:07 PM  Result Value Ref Range Status   Specimen Description RIGHT ANTECUBITAL  Final   Special Requests IN BOTH AEROBIC AND ANAEROBIC BOTTLES 5CC  Final   Culture NO GROWTH 4 DAYS  Final   Report Status PENDING  Incomplete     Scheduled Meds: . [START ON 06/19/2015] calcitRIOL  0.25 mcg Oral Q T,Th,Sa-HD  . feeding supplement (ENSURE ENLIVE)  237 mL Oral BID BM  . heparin  5,000 Units Subcutaneous Q8H  . insulin aspart  0-9 Units Subcutaneous Q4H  . metoprolol tartrate  12.5 mg Oral BID  . montelukast  10 mg Oral QHS  . multivitamin  1 tablet Oral QHS  . pantoprazole  40 mg Oral BID  . piperacillin-tazobactam (ZOSYN)  IV  2.25 g Intravenous Q8H  . sevelamer carbonate  800 mg Oral TID WC  . simvastatin  10 mg Oral QHS  . sodium chloride flush  3 mL Intravenous Q12H  . sodium chloride flush  3 mL Intravenous Q12H  . valACYclovir  500 mg Oral Daily  . [START ON 06/19/2015] vancomycin  750 mg Intravenous Q T,Th,Sa-HD   Continuous Infusions:   Procedures/Studies: Dg Chest 2 View  06/16/2015  CLINICAL DATA:  Chest pain EXAM: CHEST  2 VIEW COMPARISON:  06/12/2015 FINDINGS: Right-sided dialysis  catheter and left-sided chest wall port are again seen and stable. Cardiac shadow is stable. The lungs are well aerated bilaterally without focal infiltrate or sizable effusion. Postsurgical changes in the right humerus are seen. IMPRESSION: No acute abnormality noted. Electronically Signed   By: Inez Catalina M.D.   On: 06/16/2015 20:35   Ct Head Wo Contrast  06/12/2015  CLINICAL DATA:  Confusion. Tearful. Undergoing treatment for multiple myeloma. Altered mental status. EXAM: CT HEAD WITHOUT CONTRAST TECHNIQUE: Contiguous axial images were obtained from the base of the skull through the vertex without intravenous contrast. COMPARISON:  None. FINDINGS: No acute cortical infarct, hemorrhage, or mass lesion is present. The ventricles are of normal size. No significant extra-axial fluid collection is evident. The paranasal sinuses and mastoid air cells are clear. The calvarium is intact. The globes and orbits are intact. No significant extracranial soft tissue lesion is present. IMPRESSION: Negative CT of the head. Electronically Signed   By: San Morelle M.D.   On: 06/12/2015 14:19   US Renal  06/01/2015  CLINICAL DATA:  Acute kidney injury, diabetes mellitus, hypertension and EXAM: RENAL / URINARY TRACT ULTRASOUND COMPLETE COMPARISON:  Abdominal ultrasound 03/13/2015 FINDINGS: Right Kidney: Length: 11.8 cm. Normal cortical thickness. Increased cortical echogenicity. No hydronephrosis or shadowing calcification. Two cysts identified at mid RIGHT kidney 1.6 x 1.4 x 1.5 cm and at the mid inferior pole 2.9 x 2.5 x 2.4 cm, smaller lesion demonstrating scattered internal echogenicity/complication. Left Kidney: Length: 13.9 cm. Normal cortical thickness. Increased cortical echogenicity. No mass, hydronephrosis or shadowing calcification. Bladder: Partially distended, unremarkable. IMPRESSION: Medical renal disease changes of both kidneys. 2 RIGHT renal cysts, larger 2.9 cm greatest size simple in character  with smaller lesion 1.6 cm greatest size complicated by scattered internal echogenicity. Either followup ultrasound in 4-6 months to demonstrate stability or characterization by followup non emergent MRI after discharge when patient can optimally cooperate with MR imaging recommended to exclude cystic renal neoplasm. Electronically Signed   By: Lavonia Dana M.D.   On: 06/01/2015 13:46   Nm Pulmonary Perf And Vent  06/14/2015  CLINICAL DATA:  70 year old male with chest pain and shortness of breath. History of myeloma and prior DVT. EXAM: NUCLEAR MEDICINE VENTILATION - PERFUSION LUNG SCAN TECHNIQUE: Ventilation images were obtained in multiple projections using inhaled aerosol Tc-74mDTPA. Perfusion images were obtained in multiple projections after intravenous injection of Tc-968mAA. RADIOPHARMACEUTICALS:  31.0 mCi Technetium-9961mPA aerosol inhalation  and 4.2 mCi Technetium-69mMAA IV COMPARISON:  06/12/2015 chest radiograph. FINDINGS: Ventilation: No focal ventilation defect. Perfusion: No wedge shaped peripheral perfusion defects to suggest acute pulmonary embolism. IMPRESSION: No evidence of acute pulmonary embolus. Electronically Signed   By: JMargarette CanadaM.D.   On: 06/14/2015 16:59   Ir Fluoro Guide Cv Line Right  06/05/2015  INDICATION: Acute kidney injury, multiple myeloma, renal failure EXAM: ULTRASOUND GUIDANCE FOR VASCULAR ACCESS RIGHT INTERNAL JUGULAR PERMANENT HEMODIALYSIS CATHETER Date:  5/16/20175/16/2017 3:53 pm Radiologist:  M. TDaryll Brod MD Guidance:  Ultrasound and fluoroscopic FLUOROSCOPY TIME:  Fluoroscopy Time:  30 seconds (2 mGy). MEDICATIONS: 1% lidocaine locally, 2 g Ancef administered within 1 hour of the procedure ANESTHESIA/SEDATION: Versed 0.5 mg IV; Fentanyl 25 mcg IV; Moderate Sedation Time:  15 minutes The patient was continuously monitored during the procedure by the interventional radiology nurse under my direct supervision. CONTRAST:  None COMPLICATIONS: None immediate.  PROCEDURE: Informed consent was obtained from the patient following explanation of the procedure, risks, benefits and alternatives. The patient understands, agrees and consents for the procedure. All questions were addressed. A time out was performed. Maximal barrier sterile technique utilized including caps, mask, sterile gowns, sterile gloves, large sterile drape, hand hygiene, and 2% chlorhexidine scrub. Under sterile conditions and local anesthesia, right internal jugular micropuncture venous access was performed with ultrasound. Images were obtained for documentation of the patent right internal jugular vein. A guide wire was inserted followed by a transitional dilator. Next, a 0.035 guidewire was advanced into the IVC with a 5-French catheter. Measurements were obtained from the right venotomy site to the proximal right atrium. In the right infraclavicular chest, a subcutaneous tunnel was created under sterile conditions and local anesthesia. 1% lidocaine with epinephrine was utilized for this. The 19 cm tip to cuff palindrome catheter was tunneled subcutaneously to the venotomy site and inserted into the SVC/RA junction through a valved peel-away sheath. Position was confirmed with fluoroscopy. Images were obtained for documentation. Blood was aspirated from the catheter followed by saline and heparin flushes. The appropriate volume and strength of heparin was instilled in each lumen. Caps were applied. The catheter was secured at the tunnel site with Gelfoam and a pursestring suture. The venotomy site was closed with subcuticular Vicryl suture. Dermabond was applied to the small right neck incision. A dry sterile dressing was applied. The catheter is ready for use. No immediate complications. IMPRESSION: Ultrasound and fluoroscopically guided right internal jugular tunneled hemodialysis catheter (19 cm tip to cuff palindrome catheter). Electronically Signed   By: MJerilynn Mages  Shick M.D.   On: 06/05/2015 16:34   Ir  UKoreaGuide Vasc Access Right  06/05/2015  INDICATION: Acute kidney injury, multiple myeloma, renal failure EXAM: ULTRASOUND GUIDANCE FOR VASCULAR ACCESS RIGHT INTERNAL JUGULAR PERMANENT HEMODIALYSIS CATHETER Date:  5/16/20175/16/2017 3:53 pm Radiologist:  M. TDaryll Brod MD Guidance:  Ultrasound and fluoroscopic FLUOROSCOPY TIME:  Fluoroscopy Time:  30 seconds (2 mGy). MEDICATIONS: 1% lidocaine locally, 2 g Ancef administered within 1 hour of the procedure ANESTHESIA/SEDATION: Versed 0.5 mg IV; Fentanyl 25 mcg IV; Moderate Sedation Time:  15 minutes The patient was continuously monitored during the procedure by the interventional radiology nurse under my direct supervision. CONTRAST:  None COMPLICATIONS: None immediate. PROCEDURE: Informed consent was obtained from the patient following explanation of the procedure, risks, benefits and alternatives. The patient understands, agrees and consents for the procedure. All questions were addressed. A time out was performed. Maximal barrier sterile technique utilized including caps, mask, sterile  gowns, sterile gloves, large sterile drape, hand hygiene, and 2% chlorhexidine scrub. Under sterile conditions and local anesthesia, right internal jugular micropuncture venous access was performed with ultrasound. Images were obtained for documentation of the patent right internal jugular vein. A guide wire was inserted followed by a transitional dilator. Next, a 0.035 guidewire was advanced into the IVC with a 5-French catheter. Measurements were obtained from the right venotomy site to the proximal right atrium. In the right infraclavicular chest, a subcutaneous tunnel was created under sterile conditions and local anesthesia. 1% lidocaine with epinephrine was utilized for this. The 19 cm tip to cuff palindrome catheter was tunneled subcutaneously to the venotomy site and inserted into the SVC/RA junction through a valved peel-away sheath. Position was confirmed with  fluoroscopy. Images were obtained for documentation. Blood was aspirated from the catheter followed by saline and heparin flushes. The appropriate volume and strength of heparin was instilled in each lumen. Caps were applied. The catheter was secured at the tunnel site with Gelfoam and a pursestring suture. The venotomy site was closed with subcuticular Vicryl suture. Dermabond was applied to the small right neck incision. A dry sterile dressing was applied. The catheter is ready for use. No immediate complications. IMPRESSION: Ultrasound and fluoroscopically guided right internal jugular tunneled hemodialysis catheter (19 cm tip to cuff palindrome catheter). Electronically Signed   By: Jerilynn Mages.  Shick M.D.   On: 06/05/2015 16:34   Dg Chest Port 1 View  06/12/2015  CLINICAL DATA:  One day of fever, history of multiple myeloma, bone marrow transplant with complication, dialysis dependent renal failure. EXAM: PORTABLE CHEST 1 VIEW COMPARISON:  No recent chest x-ray in PACs comparison is made to a chest x-ray of November 21, 2011 FINDINGS: The lungs are adequately inflated. There is no focal infiltrate. There is no pleural effusion or pneumothorax. A dialysis catheter is in place via the right internal jugular approach with the tip projecting over the midportion of the SVC. A Port-A-Cath appliance is present with its tip projecting over the midportion of the SVC. The heart is normal in size. The pulmonary vascularity is not engorged. The bony thorax exhibits no acute abnormality. The patient has undergone previous plate and screw fixation for proximal humeral fracture. IMPRESSION: There is no evidence of pneumonia. No acute cardiopulmonary abnormality is observed. Electronically Signed   By: Maxson Oddo  [place] M.D.   On: 06/12/2015 16:50   Dg Abd Portable 1v  06/09/2015  CLINICAL DATA:  70 year old with acute nausea and vomiting. EXAM: PORTABLE ABDOMEN - 1 VIEW COMPARISON:  None. FINDINGS: Bowel gas pattern unremarkable  without evidence of obstruction or significant ileus. Expected stool burden in the colon. Phleboliths low in the left side of the pelvis. No visible opaque urinary tract calculi. Degenerative changes involving the lumbar spine and sacroiliac joints. IMPRESSION: No acute abdominal abnormality. Electronically Signed   By: Evangeline Dakin M.D.   On: 06/09/2015 22:23    Sevilla Murtagh, DO  [facility] Pager [phone]  If 7PM-7AM, please contact night-coverage [email or web address] Password [email or web address] 06/17/2015, 10:52 AM   LOS: 1 day

## 2015-06-18 ENCOUNTER — Inpatient Hospital Stay (HOSPITAL_COMMUNITY): Payer: Medicare Other

## 2015-06-18 DIAGNOSIS — E43 Unspecified severe protein-calorie malnutrition: Secondary | ICD-10-CM | POA: Insufficient documentation

## 2015-06-18 DIAGNOSIS — E860 Dehydration: Secondary | ICD-10-CM

## 2015-06-18 LAB — GLUCOSE, CAPILLARY
GLUCOSE-CAPILLARY: 104 mg/dL — AB (ref 65–99)
GLUCOSE-CAPILLARY: 156 mg/dL — AB (ref 65–99)
GLUCOSE-CAPILLARY: 99 mg/dL (ref 65–99)
Glucose-Capillary: 90 mg/dL (ref 65–99)
Glucose-Capillary: 221 mg/dL — ABNORMAL HIGH (ref 65–99)
Glucose-Capillary: 108 mg/dL — ABNORMAL HIGH (ref 65–99)

## 2015-06-18 LAB — TROPONIN I: TROPONIN I: 0.04 ng/mL — AB (ref <0.031)

## 2015-06-18 MED ORDER — BOOST / RESOURCE BREEZE PO LIQD
1.0000 | Freq: Two times a day (BID) | ORAL | Status: DC
  Administered 2015-06-18 – 2015-06-19 (×2): 1 via ORAL

## 2015-06-18 MED ORDER — DARBEPOETIN ALFA 100 MCG/0.5ML IJ SOSY
100.0000 ug | PREFILLED_SYRINGE | INTRAMUSCULAR | Status: DC
  Administered 2015-06-26: 100 ug via INTRAVENOUS
  Filled 2015-06-18: qty 0.5

## 2015-06-18 NOTE — Progress Notes (Signed)
Initial Nutrition Assessment  DOCUMENTATION CODES:   Severe malnutrition in context of chronic illness  INTERVENTION:   -Continue Boost Breeze po BID, each supplement provides 250 kcal and 9 grams of protein -Once diet is advanced, add Ensure Enlive po BID, each supplement provides 350 kcal and 20 grams of protein  NUTRITION DIAGNOSIS:   Malnutrition related to chronic illness as evidenced by severe depletion of body fat, severe depletion of muscle mass, percent weight loss.  GOAL:   Patient will meet greater than or equal to 90% of their needs  MONITOR:   PO intake, Supplement acceptance, Diet advancement, Labs, Weight trends, Skin, I & O's  REASON FOR ASSESSMENT:   Malnutrition Screening Tool    ASSESSMENT:   Eddie Thomas is a 70 y.o. 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 Julien Nordmann of medical oncology) who presents to the ED with complaints of Fevers and chills and Nausea and Vomiting since discharge to home yesterday.  Pt admitted with SIRS.   Spoke with pt at bedside, who has very flat affect. He did not engage in conversation with this RD, would not use eye contact and answered questions with close-ended answers. He confirms appetite was poor PTA and was consuming one Ensure supplement daily PTA.   Pt is currently on a clear liquid diet. Meal completion is poor; noted 0-25% meal completion per doc flowsheets. Pt reports he ate "a little bit" off his breakfast tray. Pt consumed about 25% of peach Boost Breeze supplement; discussed importance of consuming supplements, particularly in light of poor PO intake. Pt agreeable to continue Boost Breeze until diet is able to be advanced.   Per chart review, UBW around 174#. Pt has experienced a 17.8% wt loss over the past 6 months, which is significant for time frame.   Nutrition-Focused  physical exam completed. Findings are severe fat depletion, severe muscle depletion, and no edema.   Pt undergoing chemotherapy treatments (Velcade/Daratumumab) due to multiple myeloma. Pt is followed by [facility]; last visit on 05/17/15.   Case discussed with RN. She reveals pt appetite has been poor and ate very little of clear liquid diet this AM. She offered peach breeze to pt.   Labs reviewed: CBGS: 90-108.   Diet Order:  Diet clear liquid Room service appropriate?: Yes; Fluid consistency:: Thin  Skin:  Reviewed, no issues  Last BM:  06/14/15  Height:   Ht Readings from Last 1 Encounters:  06/16/15 _0  (1.803 m)    Weight:   Wt Readings from Last 1 Encounters:  06/17/15 143 lb 1.3 oz (64.9 kg)    Ideal Body Weight:  78.2 kg  BMI:  Body mass index is 19.96 kg/(m^2).  Estimated Nutritional Needs:   Kcal:  2100-2300  Protein:  100-115 grams  Fluid:  per MD  EDUCATION NEEDS:   Education needs addressed  Viktoria Gruetzmacher A. Jimmye Norman, RD, LDN, CDE Pager: [phone] After hours Pager: [phone]

## 2015-06-18 NOTE — Progress Notes (Signed)
Patient complaining of shortness of breath.  MD notified O2 100% BP 139/62 HR 99 T 98.5.  MD notified.

## 2015-06-18 NOTE — Progress Notes (Signed)
[facility] Progress Note  Assessment/Plan: 1. SIRS -  Tmax 100.6 ? Aspiration pneumonitis CXR neg on admission - he also had fever spike 5/23 to 101.5 CXR neg BC were neg - no abtx given during that event; now on empiric Vanc and Zosyn 2. ESRD - TTS - HD tomorrow- reassess EDW 3. Anemia - transfused 2 units PRBC last week; on Aranesp 100 q Tuesday- received 5/23 only ;hgb 7.9 trending down; tsat 34% and ferritin 1822 5/23 - plan continue ESA same dose - transfuse prn 4. Secondary hyperparathyroidism - calcitriol 0.25 q HD; renvela 800 1 ac - started last admission 5. HTN/volume - CXR neg for volume - possibly  can raise EDW - d/c EDW last week - based on prior post HD wts 6. Nutrition CL - add Resource 7. MM - refractory to multiple regimens - still getting Velcade/daratumumab infusions perioridicly at [facility] and s/p SCT 2012- 8. Thrombocytopenia -plts 72 - follow 9. Hiccups - thorazine/reglan 10. DM - per primary  Myriam Jacobson, PA-C [place] [phone] 06/18/2015,8:57 AM  LOS: 2 days   Pt seen, examined and agree w A/P as above.  Kelly Splinter MD [facility] pager [phone]    cell [phone] 06/18/2015, 2:35 PM    Subjective:   Michela Pitcher it was hard sitting 4 hours at outpt HD unit. Vomiting after he got home when eating food. Taking CL without problems, still with hiccups  Objective Filed Vitals:   06/17/15 2020 06/17/15 2202 06/18/15 0410 06/18/15 0737  BP: 139/60  121/61 145/68  Pulse: 116  90 101  Temp: 100.1 F (37.8 C)  98.7 F (37.1 C) 100.6 F (38.1 C)  TempSrc: Oral  Oral Oral  Resp: 20  20 18   Height:      Weight:  64.9 kg (143 lb 1.3 oz)    SpO2: 96%  99% 100%   Physical Exam General: slender AAM with intermittent hiccups Heart: tachy reg Lungs: no rales Abdomen: soft NT Extremities: No LE edema Dialysis Access: left upper AVF + bruit maturing and right IJ (also has left port for IV  access)  Dialysis Orders:  [facility] Tuesday/Thursday/Saturday, 4 hours, 180 dialyzer, blood flow rate 400, dialysate flow 800, EDW 62.5 kg, 3K/2.25 calcium, no UF profile, no sodium modeling. No heparin. Hectorol 1 g 3 times a week, Mircera 200 g IV every 2 weeks- none given . Tunneled RIJ dialysis catheter with maturing left BCF. ONLY had one tmt 5/27 at [facility] - he was not febrile while at dialysis however HR was in the 120s pre and post HD - net UF only 0.6 with post wt of 64.7  Additional Objective Labs: Basic Metabolic Panel:  Recent Labs Lab 06/12/15 0832  06/14/15 0649 06/16/15 1934 06/17/15 0245  NA 140  < > 137 133* 134*  K 4.3  < > 3.8 4.5 4.7  CL 96*  < > 97* 95* 97*  CO2 26  < > 23 24 26   GLUCOSE 161*  < > 181* 179* 141*  BUN 76*  < > 57* 15 20  CREATININE 10.04*  < > 8.06* 3.77* 4.69*  CALCIUM 8.7*  < > 8.7* 8.1* 7.8*  PHOS 5.4*  --  6.3*  --   --   < > = values in this interval not displayed. Liver Function Tests:  Recent Labs Lab 06/12/15 0832 06/14/15 0649  ALBUMIN 2.3* 2.5*   CBC:  Recent Labs Lab 06/14/15 0442 06/14/15 [id number]  06/15/15 0520 06/16/15 1934 06/17/15 0245  WBC 3.6* 4.3 4.3 4.5 4.2  HGB 9.0* 9.3* 8.3* 8.6* 7.9*  HCT 27.3* 28.6* 26.4* 27.1* 25.1*  MCV 80.1 81.0 82.0 83.1 83.7  PLT 82* 82* 82* 89* 72*   Blood Culture    Component Value Date/Time   SDES BLOOD RIGHT HAND 06/16/2015 2230   SPECREQUEST IN PEDIATRIC BOTTLE 1CC 06/16/2015 2230   CULT NO GROWTH < 24 HOURS 06/16/2015 2230   REPTSTATUS PENDING 06/16/2015 2230    Cardiac Enzymes:  Recent Labs Lab 06/14/15 1900 06/15/15 0520 06/15/15 1026  TROPONINI <0.03 0.04* 0.03   CBG:  Recent Labs Lab 06/17/15 1629 06/17/15 2018 06/18/15 0017 06/18/15 0400 06/18/15 0734  GLUCAP 196* 108* 104* 99 90   Studies/Results: Dg Chest 2 View  06/16/2015  CLINICAL DATA:  Chest pain EXAM: CHEST  2 VIEW COMPARISON:  06/12/2015 FINDINGS: Right-sided dialysis catheter and left-sided chest  wall port are again seen and stable. Cardiac shadow is stable. The lungs are well aerated bilaterally without focal infiltrate or sizable effusion. Postsurgical changes in the right humerus are seen. IMPRESSION: No acute abnormality noted. Electronically Signed   By: Inez Catalina M.D.   On: 06/16/2015 20:35   Medications:   . [START ON 06/19/2015] calcitRIOL  0.25 mcg Oral Q T,Th,Sa-HD  . feeding supplement (ENSURE ENLIVE)  237 mL Oral BID BM  . heparin  5,000 Units Subcutaneous Q8H  . insulin aspart  0-9 Units Subcutaneous Q4H  . metoCLOPramide (REGLAN) injection  5 mg Intravenous Q6H  . metoprolol tartrate  12.5 mg Oral BID  . montelukast  10 mg Oral QHS  . multivitamin  1 tablet Oral QHS  . pantoprazole (PROTONIX) IV  40 mg Intravenous Q12H  . piperacillin-tazobactam (ZOSYN)  IV  2.25 g Intravenous Q8H  . sevelamer carbonate  800 mg Oral TID WC  . simvastatin  10 mg Oral QHS  . sodium chloride flush  3 mL Intravenous Q12H  . sodium chloride flush  3 mL Intravenous Q12H  . valACYclovir  500 mg Oral Daily  . [START ON 06/19/2015] vancomycin  750 mg Intravenous Q T,Th,Sa-HD

## 2015-06-18 NOTE — Progress Notes (Signed)
PROGRESS NOTE  Eddie Thomas XHF:[id number] DOB: 06/05/1945 DOA: 06/16/2015 PCP: Eddie Thomas  Brief History:  70 y.o. male with medical history significant of multiple myeloma, IgA subtype, currently undergoing treatment with Velcade/Daratumumab at the cancer center, follows Dr. Julien Thomas of medical oncology, also having a history of chronic kidney disease (with baseline creatinine near 1.6-2.0), hypertension, diabetes mellitus, presented as a transfer from the cancer center for acute renal failure with creatinine of 10 on 06/01/15. Nephrology was consulted, and the patient was initiated on dialysis during his last hospitalization. A tunnel dialysis catheter and AVF was placed by [facility]. He was discharged in stable condition on 06/15/15. Unfortunately, the patient developed a fever up to 101.53F with associated nausea and vomiting after his dialysis session on 06/15/2015. After admission, the patient has had a temperature of 100.68F with tachycardia and soft blood pressures. Nephrology has been consulted to assist with management.  Summary of previous hospitalization--Intractable hiccups- unclear etiology. ABLA complicating chronic anemia > multiple antibodies on type and screen- Thomas for 2 PRBC's on 5/23. Acute AMS developed on 06/12/15 which was likely multifactorial including medications and possible new infection as pt had a fever up to 101.6. Since then, his mental status has improved and returned back to baseline. On 06/14/15, pt c/o of cp and sob. With his hx of DVT, V/QScan was obtained and it was negative. In addition, the patient had coffee grounds emesis during hospitalization. He was evaluated by [facility]. They did not feel the patient needed an additional EGD at this time as he has had recent EGD on 05/16/2015. His PPI was increased to twice a day, and Reglan was added.  Assessment/Thomas: SIRS/Fever -presents with fever and tachycardia -Suspect aspiration pneumonitis  given the patient's recurrent hiccups and vomiting vs neoplastic fever from myeloma (refractory to multiple chemo regimens and SCT in 2012) -06/16/15-blood culture remains neg -Continue empiric antibiotics--->d/c vanco, continue zosyn for now -if fever persists, CT chest, abd/pelvis -ESR in am -06/07/15 echo--EF 55-65%, grade 2 DD, mod TR -Lactic acid 0.99 -Procalcitonin 1.63  Acute renal failure complicating chronic kidney disease-->ESRD - Nephrology was consulted. As per nephrology, creatinine started to rise in March-April 2017 up to 1.4-1.8 range with some bumps up to 2.3-2.5. On May 8, creatinine was 3.3.  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 Patient's myeloma is refractory to multiple different chemotherapy combinations and do not believe that his chemotherapy or pheresis is going to help his renal function. - Nephrology suggested palliative care consultation, to be pursued by oncology as outpatient. - Patient undergoing hemodialysis per nephrology. Last HD on 5/22. [facility] and AVF placed by [facility]. HD initiated by nephrology. Outpatient TTS dialysis >has been arranged by nephrology-->[place]  Acute encephalopathy -developed between 5/22 night and 5/23 AM -likely multifactorial including infection, meds, worsen anemia -new fever on 06/12/15 of 101.6--blood cultures negative at the time of discharge -06/12/15 CT brain negative -06/13/15--mental status improving -Discontinued Thorazine, baclofen, opioids, gabapentin, other hypnotics -check ammonia--24  -B12--266 -TSH--0.493, Free T4--1.04 -with B12 in low normal range-->started supplementation -Mental status presently at baseline  Intractable hiccups, intermittent vomiting and coffee grounds emesis - Briefly started on Ativan without significant improvement. Then was changed to baclofen (received 3 doses)-this was stopped on 5/22 due to concern for side effects in renal failure. Started on Thorazine 5/22. All meds stopped due to altered  mental status  -Intermittent hiccups continue since discontinuation of hypnotic medications -After his mental status improved,  the patient was restarted back on po Thorazine with some improvement -[facility] was consulted. They did not feel the patient needed any additional procedures at this time. PPI was increased to twice a day and Reglan was added. The patient will follow-up with Dr. Fuller Thomas in the outpatient setting.  Atypical Chest Pain -developed 06/14/15 -with hx of DVT left leg, myeloma and new tachycardia-->VQ scan neg -EKG-no concerning ischemic changes -cycle troponins--unremarkable; minimal elevation likely due to ESRD -suspect GERD/esophagitis from vomiting -try tums prn  Multiple Myeloma: - Follows with Dr Eddie Thomas.  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 - Transfuse 2 units of PRBC 5/23 and follow posttransfusion hemoglobin. Blood bank called and stated patient has multiple antibodies related to his myeloma medications but director of blood bank has cleared the available blood for transfusion. Verified same with oncologist who has had similar experience in the past. -total 5 units received during previous admission  Pancytopenia - Secondary to multiple myeloma and related cancer chemotherapy. - Anemia management as above. Thrombocytopenia stable.  NSVT: - Patient had 15 beats of nonsustained V. tach, hypomagnesemia corrected, 2-D echo done 11/2015 with EF 55-60% - Continue with telemetry monitor - May be related to renal failure, electrolyte abnormalities. Repeat 2-D echo: Normal EF. -Continue metoprolol 12.5 mg twice a day  History of DVT - Xarelto DC'ed given renal function (took Xarelto approximately one year) - Discussed with Dr. Julien Thomas on 5/17 and he had recommended continued anticoagulation. Attempted IV heparin bridging and Coumadin but each time  IV heparin was initiated, patient had bleeding from [facility] site and hence all anticoagulation was stopped. Not good anticoagulation candidate due to bleeding complications. Discussed with Dr. Julien Thomas on 5/21.  Essential hypertension - ACEI stopped due to acute renal failure.  -Blood pressure was controlled, but gradually increased during hospitalization -Continue low-dose metoprolol tartrate 12.5 mg twice a day  Diabetes mellitus type 2 -CBGs remained largely controlled -Discontinue Actos -was sent home with low-dose glipizide last admit -novolog sliding scale for now  Disposition Thomas: Home in 1-2 days  Family Communication: Sister updated at beside  Consultants: Renal  Code Status: FULL  Subjective: Patient is feeling better overall but still has no energy. Denies any fevers, chills, chest pain, short of breath, vomiting, diarrhea, abdominal pain. He denies any headache or neck pain. He states that his hiccups are better.  Objective: Filed Vitals:   06/17/15 2202 06/18/15 0410 06/18/15 0737 06/18/15 1214  BP:  121/61 145/68 109/53  Pulse:  90 101 85  Temp:  98.7 F (37.1 C) 100.6 F (38.1 C) 99.2 F (37.3 C)  TempSrc:  Oral Oral Oral  Resp:  20 18 17   Height:      Weight: 64.9 kg (143 lb 1.3 oz)     SpO2:  99% 100% 100%    Intake/Output Summary (Last 24 hours) at 06/18/15 1602 Last data filed at 06/18/15 1419  Gross per 24 hour  Intake    480 ml  Output      0 ml  Net    480 ml   Weight change: -0.1 kg (-3.5 oz) Exam:   General:  Pt is alert, follows commands appropriately, not in acute distress  HEENT: No icterus, No thrush, No neck mass, [place]/AT  Cardiovascular: RRR, S1/S2, no rubs, no gallops  Respiratory: Fine bibasilar crackles. No wheezing.  Abdomen: Soft/+BS, non tender, non distended,  no guarding  Extremities: No edema, No lymphangitis, No petechiae, No rashes, no synovitis   Data Reviewed: I have personally reviewed following labs and  imaging studies Basic Metabolic Panel:  Recent Labs Lab 06/12/15 0832 06/13/15 0500 06/14/15 0649 06/16/15 1934 06/17/15 0245  NA 140 137 137 133* 134*  K 4.3 4.1 3.8 4.5 4.7  CL 96* 97* 97* 95* 97*  CO2 26 24 23 24 26   GLUCOSE 161* 139* 181* 179* 141*  BUN 76* 35* 57* 15 20  CREATININE 10.04* 5.50* 8.06* 3.77* 4.69*  CALCIUM 8.7* 8.0* 8.7* 8.1* 7.8*  PHOS 5.4*  --  6.3*  --   --    Liver Function Tests:  Recent Labs Lab 06/12/15 0832 06/14/15 0649  ALBUMIN 2.3* 2.5*   No results for input(s): LIPASE, AMYLASE in the last 168 hours.  Recent Labs Lab 06/13/15 1135  AMMONIA 24   Coagulation Profile:  Recent Labs Lab 06/12/15 0420 06/13/15 0500 06/14/15 0442 06/15/15 0520  INR 1.89* 1.77* 1.47 1.44   CBC:  Recent Labs Lab 06/14/15 0442 06/14/15 0649 06/15/15 0520 06/16/15 1934 06/17/15 0245  WBC 3.6* 4.3 4.3 4.5 4.2  HGB 9.0* 9.3* 8.3* 8.6* 7.9*  HCT 27.3* 28.6* 26.4* 27.1* 25.1*  MCV 80.1 81.0 82.0 83.1 83.7  PLT 82* 82* 82* 89* 72*   Cardiac Enzymes:  Recent Labs Lab 06/14/15 1900 06/15/15 0520 06/15/15 1026  TROPONINI <0.03 0.04* 0.03   BNP: Invalid input(s): POCBNP CBG:  Recent Labs Lab 06/17/15 2018 06/18/15 0017 06/18/15 0400 06/18/15 0734 06/18/15 1213  GLUCAP 108* 104* 99 90 221*   HbA1C: No results for input(s): HGBA1C in the last 72 hours. Urine analysis:    Component Value Date/Time   COLORURINE YELLOW 06/01/2015 [place] 06/01/2015 1540   LABSPEC 1.008 06/01/2015 1540   PHURINE 6.0 06/01/2015 1540   GLUCOSEU NEGATIVE 06/01/2015 1540   HGBUR TRACE* 06/01/2015 1540   BILIRUBINUR NEGATIVE 06/01/2015 1540   KETONESUR NEGATIVE 06/01/2015 1540   PROTEINUR 30* 06/01/2015 1540   UROBILINOGEN 1.0 01/16/2012 1902   NITRITE NEGATIVE 06/01/2015 1540   LEUKOCYTESUR NEGATIVE 06/01/2015 1540   Sepsis Labs: @LABRCNTIP (procalcitonin:4,lacticidven:4) ) Recent Results (from the past 240 hour(s))  Culture,  blood (Routine X 2) w Reflex to ID Panel     Status: None   Collection Time: 06/12/15  8:58 PM  Result Value Ref Range Status   Specimen Description RIGHT ANTECUBITAL  Final   Special Requests IN BOTH AEROBIC AND ANAEROBIC BOTTLES 5CC  Final   Culture NO GROWTH 5 DAYS  Final   Report Status 06/17/2015 FINAL  Final  Culture, blood (Routine X 2) w Reflex to ID Panel     Status: None   Collection Time: 06/12/15  9:07 PM  Result Value Ref Range Status   Specimen Description RIGHT ANTECUBITAL  Final   Special Requests IN BOTH AEROBIC AND ANAEROBIC BOTTLES 5CC  Final   Culture NO GROWTH 5 DAYS  Final   Report Status 06/17/2015 FINAL  Final  Blood culture (routine x 2)     Status: None (Preliminary result)   Collection Time: 06/16/15 10:11 PM  Result Value Ref Range Status   Specimen Description BLOOD RIGHT ANTECUBITAL  Final   Special Requests BOTTLES DRAWN AEROBIC AND ANAEROBIC 5CC   Final   Culture NO GROWTH 2 DAYS  Final   Report Status PENDING  Incomplete  Blood culture (routine x 2)     Status: None (Preliminary result)  Collection Time: 06/16/15 10:30 PM  Result Value Ref Range Status   Specimen Description BLOOD RIGHT HAND  Final   Special Requests IN PEDIATRIC BOTTLE 1CC  Final   Culture NO GROWTH 2 DAYS  Final   Report Status PENDING  Incomplete     Scheduled Meds: . [START ON 06/19/2015] calcitRIOL  0.25 mcg Oral Q T,Th,Sa-HD  . [START ON 06/19/2015] darbepoetin (ARANESP) injection - DIALYSIS  100 mcg Intravenous Q Tue-HD  . feeding supplement  1 Container Oral BID BM  . feeding supplement (ENSURE ENLIVE)  237 mL Oral BID BM  . heparin  5,000 Units Subcutaneous Q8H  . insulin aspart  0-9 Units Subcutaneous Q4H  . metoCLOPramide (REGLAN) injection  5 mg Intravenous Q6H  . metoprolol tartrate  12.5 mg Oral BID  . montelukast  10 mg Oral QHS  . multivitamin  1 tablet Oral QHS  . pantoprazole (PROTONIX) IV  40 mg Intravenous Q12H  . piperacillin-tazobactam (ZOSYN)  IV  2.25 g  Intravenous Q8H  . sevelamer carbonate  800 mg Oral TID WC  . simvastatin  10 mg Oral QHS  . sodium chloride flush  3 mL Intravenous Q12H  . sodium chloride flush  3 mL Intravenous Q12H  . valACYclovir  500 mg Oral Daily  . [START ON 06/19/2015] vancomycin  750 mg Intravenous Q T,Th,Sa-HD   Continuous Infusions:   Procedures/Studies: Dg Chest 2 View  06/16/2015  CLINICAL DATA:  Chest pain EXAM: CHEST  2 VIEW COMPARISON:  06/12/2015 FINDINGS: Right-sided dialysis catheter and left-sided chest wall port are again seen and stable. Cardiac shadow is stable. The lungs are well aerated bilaterally without focal infiltrate or sizable effusion. Postsurgical changes in the right humerus are seen. IMPRESSION: No acute abnormality noted. Electronically Signed   By: Inez Catalina M.D.   On: 06/16/2015 20:35   Ct Head Wo Contrast  06/12/2015  CLINICAL DATA:  Confusion. Tearful. Undergoing treatment for multiple myeloma. Altered mental status. EXAM: CT HEAD WITHOUT CONTRAST TECHNIQUE: Contiguous axial images were obtained from the base of the skull through the vertex without intravenous contrast. COMPARISON:  None. FINDINGS: No acute cortical infarct, hemorrhage, or mass lesion is present. The ventricles are of normal size. No significant extra-axial fluid collection is evident. The paranasal sinuses and mastoid air cells are clear. The calvarium is intact. The globes and orbits are intact. No significant extracranial soft tissue lesion is present. IMPRESSION: Negative CT of the head. Electronically Signed   By: San Morelle M.D.   On: 06/12/2015 14:19   US Renal  06/01/2015  CLINICAL DATA:  Acute kidney injury, diabetes mellitus, hypertension and EXAM: RENAL / URINARY TRACT ULTRASOUND COMPLETE COMPARISON:  Abdominal ultrasound 03/13/2015 FINDINGS: Right Kidney: Length: 11.8 cm. Normal cortical thickness. Increased cortical echogenicity. No hydronephrosis or shadowing calcification. Two cysts identified at  mid RIGHT kidney 1.6 x 1.4 x 1.5 cm and at the mid inferior pole 2.9 x 2.5 x 2.4 cm, smaller lesion demonstrating scattered internal echogenicity/complication. Left Kidney: Length: 13.9 cm. Normal cortical thickness. Increased cortical echogenicity. No mass, hydronephrosis or shadowing calcification. Bladder: Partially distended, unremarkable. IMPRESSION: Medical renal disease changes of both kidneys. 2 RIGHT renal cysts, larger 2.9 cm greatest size simple in character with smaller lesion 1.6 cm greatest size complicated by scattered internal echogenicity. Either followup ultrasound in 4-6 months to demonstrate stability or characterization by followup non emergent MRI after discharge when patient can optimally cooperate with MR imaging recommended to exclude cystic renal neoplasm. Electronically  Signed   By: Lavonia Dana M.D.   On: 06/01/2015 13:46   Nm Pulmonary Perf And Vent  06/14/2015  CLINICAL DATA:  70 year old male with chest pain and shortness of breath. History of myeloma and prior DVT. EXAM: NUCLEAR MEDICINE VENTILATION - PERFUSION LUNG SCAN TECHNIQUE: Ventilation images were obtained in multiple projections using inhaled aerosol Tc-23mDTPA. Perfusion images were obtained in multiple projections after intravenous injection of Tc-957mAA. RADIOPHARMACEUTICALS:  31.0 mCi Technetium-9934mPA aerosol inhalation and 4.2 mCi Technetium-43m38m IV COMPARISON:  06/12/2015 chest radiograph. FINDINGS: Ventilation: No focal ventilation defect. Perfusion: No wedge shaped peripheral perfusion defects to suggest acute pulmonary embolism. IMPRESSION: No evidence of acute pulmonary embolus. Electronically Signed   By: JeffMargarette [place].   On: 06/14/2015 16:59   Ir Fluoro Guide Cv Line Right  06/05/2015  INDICATION: Acute kidney injury, multiple myeloma, renal failure EXAM: ULTRASOUND GUIDANCE FOR VASCULAR ACCESS RIGHT INTERNAL JUGULAR PERMANENT HEMODIALYSIS CATHETER Date:  5/16/20175/16/2017 3:53 pm Radiologist:  M.  TrevDaryll Brod Guidance:  Ultrasound and fluoroscopic FLUOROSCOPY TIME:  Fluoroscopy Time:  30 seconds (2 mGy). MEDICATIONS: 1% lidocaine locally, 2 g Ancef administered within 1 hour of the procedure ANESTHESIA/SEDATION: Versed 0.5 mg IV; Fentanyl 25 mcg IV; Moderate Sedation Time:  15 minutes The patient was continuously monitored during the procedure by the interventional radiology nurse under my direct supervision. CONTRAST:  None COMPLICATIONS: None immediate. PROCEDURE: Informed consent was obtained from the patient following explanation of the procedure, risks, benefits and alternatives. The patient understands, agrees and consents for the procedure. All questions were addressed. A time out was performed. Maximal barrier sterile technique utilized including caps, mask, sterile gowns, sterile gloves, large sterile drape, hand hygiene, and 2% chlorhexidine scrub. Under sterile conditions and local anesthesia, right internal jugular micropuncture venous access was performed with ultrasound. Images were obtained for documentation of the patent right internal jugular vein. A guide wire was inserted followed by a transitional dilator. Next, a 0.035 guidewire was advanced into the IVC with a 5-French catheter. Measurements were obtained from the right venotomy site to the proximal right atrium. In the right infraclavicular chest, a subcutaneous tunnel was created under sterile conditions and local anesthesia. 1% lidocaine with epinephrine was utilized for this. The 19 cm tip to cuff palindrome catheter was tunneled subcutaneously to the venotomy site and inserted into the SVC/RA junction through a valved peel-away sheath. Position was confirmed with fluoroscopy. Images were obtained for documentation. Blood was aspirated from the catheter followed by saline and heparin flushes. The appropriate volume and strength of heparin was instilled in each lumen. Caps were applied. The catheter was secured at the tunnel site  with Gelfoam and a pursestring suture. The venotomy site was closed with subcuticular Vicryl suture. Dermabond was applied to the small right neck incision. A dry sterile dressing was applied. The catheter is ready for use. No immediate complications. IMPRESSION: Ultrasound and fluoroscopically guided right internal jugular tunneled hemodialysis catheter (19 cm tip to cuff palindrome catheter). Electronically Signed   By: M.  Jerilynn Mageshick M.D.   On: 06/05/2015 16:34   Ir Us GKoreade Vasc Access Right  06/05/2015  INDICATION: Acute kidney injury, multiple myeloma, renal failure EXAM: ULTRASOUND GUIDANCE FOR VASCULAR ACCESS RIGHT INTERNAL JUGULAR PERMANENT HEMODIALYSIS CATHETER Date:  5/16/20175/16/2017 3:53 pm Radiologist:  M. TrevDaryll Brod Guidance:  Ultrasound and fluoroscopic FLUOROSCOPY TIME:  Fluoroscopy Time:  30 seconds (2 mGy). MEDICATIONS: 1% lidocaine locally, 2 g Ancef administered within 1 hour of the  procedure ANESTHESIA/SEDATION: Versed 0.5 mg IV; Fentanyl 25 mcg IV; Moderate Sedation Time:  15 minutes The patient was continuously monitored during the procedure by the interventional radiology nurse under my direct supervision. CONTRAST:  None COMPLICATIONS: None immediate. PROCEDURE: Informed consent was obtained from the patient following explanation of the procedure, risks, benefits and alternatives. The patient understands, agrees and consents for the procedure. All questions were addressed. A time out was performed. Maximal barrier sterile technique utilized including caps, mask, sterile gowns, sterile gloves, large sterile drape, hand hygiene, and 2% chlorhexidine scrub. Under sterile conditions and local anesthesia, right internal jugular micropuncture venous access was performed with ultrasound. Images were obtained for documentation of the patent right internal jugular vein. A guide wire was inserted followed by a transitional dilator. Next, a 0.035 guidewire was advanced into the IVC with a  5-French catheter. Measurements were obtained from the right venotomy site to the proximal right atrium. In the right infraclavicular chest, a subcutaneous tunnel was created under sterile conditions and local anesthesia. 1% lidocaine with epinephrine was utilized for this. The 19 cm tip to cuff palindrome catheter was tunneled subcutaneously to the venotomy site and inserted into the SVC/RA junction through a valved peel-away sheath. Position was confirmed with fluoroscopy. Images were obtained for documentation. Blood was aspirated from the catheter followed by saline and heparin flushes. The appropriate volume and strength of heparin was instilled in each lumen. Caps were applied. The catheter was secured at the tunnel site with Gelfoam and a pursestring suture. The venotomy site was closed with subcuticular Vicryl suture. Dermabond was applied to the small right neck incision. A dry sterile dressing was applied. The catheter is ready for use. No immediate complications. IMPRESSION: Ultrasound and fluoroscopically guided right internal jugular tunneled hemodialysis catheter (19 cm tip to cuff palindrome catheter). Electronically Signed   By: Jerilynn Mages.  Shick M.D.   On: 06/05/2015 16:34   Dg Chest Port 1 View  06/12/2015  CLINICAL DATA:  One day of fever, history of multiple myeloma, bone marrow transplant with complication, dialysis dependent renal failure. EXAM: PORTABLE CHEST 1 VIEW COMPARISON:  No recent chest x-ray in PACs comparison is made to a chest x-ray of November 21, 2011 FINDINGS: The lungs are adequately inflated. There is no focal infiltrate. There is no pleural effusion or pneumothorax. A dialysis catheter is in place via the right internal jugular approach with the tip projecting over the midportion of the SVC. A Port-A-Cath appliance is present with its tip projecting over the midportion of the SVC. The heart is normal in size. The pulmonary vascularity is not engorged. The bony thorax exhibits no  acute abnormality. The patient has undergone previous plate and screw fixation for proximal humeral fracture. IMPRESSION: There is no evidence of pneumonia. No acute cardiopulmonary abnormality is observed. Electronically Signed   By: Jenness Stemler  [place] M.D.   On: 06/12/2015 16:50   Dg Abd Portable 1v  06/09/2015  CLINICAL DATA:  70 year old with acute nausea and vomiting. EXAM: PORTABLE ABDOMEN - 1 VIEW COMPARISON:  None. FINDINGS: Bowel gas pattern unremarkable without evidence of obstruction or significant ileus. Expected stool burden in the colon. Phleboliths low in the left side of the pelvis. No visible opaque urinary tract calculi. Degenerative changes involving the lumbar spine and sacroiliac joints. IMPRESSION: No acute abdominal abnormality. Electronically Signed   By: Evangeline Dakin M.D.   On: 06/09/2015 22:23    Elyas Villamor, DO  [facility] Pager [phone]  If 7PM-7AM, please contact night-coverage  [email or web address] Password [email or web address] 06/18/2015, 4:02 PM   LOS: 2 days

## 2015-06-19 ENCOUNTER — Ambulatory Visit: Payer: Medicare Other

## 2015-06-19 DIAGNOSIS — R509 Fever, unspecified: Secondary | ICD-10-CM | POA: Insufficient documentation

## 2015-06-19 LAB — GLUCOSE, CAPILLARY
GLUCOSE-CAPILLARY: 105 mg/dL — AB (ref 65–99)
GLUCOSE-CAPILLARY: 164 mg/dL — AB (ref 65–99)
Glucose-Capillary: 97 mg/dL (ref 65–99)
Glucose-Capillary: 96 mg/dL (ref 65–99)
Glucose-Capillary: 86 mg/dL (ref 65–99)
Glucose-Capillary: 140 mg/dL — ABNORMAL HIGH (ref 65–99)

## 2015-06-19 LAB — RENAL FUNCTION PANEL
Albumin: 2.2 g/dL — ABNORMAL LOW (ref 3.5–5.0)
Anion gap: 13 (ref 5–15)
BUN: 13 mg/dL (ref 6–20)
CO2: 26 mmol/L (ref 22–32)
Calcium: 8 mg/dL — ABNORMAL LOW (ref 8.9–10.3)
Chloride: 96 mmol/L — ABNORMAL LOW (ref 101–111)
Creatinine, Ser: 3.7 mg/dL — ABNORMAL HIGH (ref 0.61–1.24)
GFR calc Af Amer: 18 mL/min — ABNORMAL LOW (ref >60)
GFR calc non Af Amer: 15 mL/min — ABNORMAL LOW (ref >60)
Glucose, Bld: 120 mg/dL — ABNORMAL HIGH (ref 65–99)
Phosphorus: 1.7 mg/dL — ABNORMAL LOW (ref 2.5–4.6)
Potassium: 3.8 mmol/L (ref 3.5–5.1)
Sodium: 135 mmol/L (ref 135–145)

## 2015-06-19 LAB — CBC WITH DIFFERENTIAL/PLATELET
Basophils Absolute: 0 K/uL (ref 0.0–0.1)
Basophils Relative: 0 %
Eosinophils Absolute: 0 K/uL (ref 0.0–0.7)
Eosinophils Relative: 0 %
HCT: 22.9 % — ABNORMAL LOW (ref 39.0–52.0)
Hemoglobin: 7.2 g/dL — ABNORMAL LOW (ref 13.0–17.0)
Lymphocytes Relative: 8 %
Lymphs Abs: 0.3 K/uL — ABNORMAL LOW (ref 0.7–4.0)
MCH: 25.8 pg — ABNORMAL LOW (ref 26.0–34.0)
MCHC: 31.4 g/dL (ref 30.0–36.0)
MCV: 82.1 fL (ref 78.0–100.0)
Monocytes Absolute: 0.2 K/uL (ref 0.1–1.0)
Monocytes Relative: 5 %
Neutro Abs: 3 K/uL (ref 1.7–7.7)
Neutrophils Relative %: 87 %
Platelets: 73 K/uL — ABNORMAL LOW (ref 150–400)
RBC: 2.79 MIL/uL — ABNORMAL LOW (ref 4.22–5.81)
RDW: 16.3 % — ABNORMAL HIGH (ref 11.5–15.5)
WBC: 3.5 K/uL — ABNORMAL LOW (ref 4.0–10.5)

## 2015-06-19 LAB — TROPONIN I
Troponin I: 0.03 ng/mL (ref <0.031)
Troponin I: 0.05 ng/mL — ABNORMAL HIGH (ref <0.031)

## 2015-06-19 LAB — SEDIMENTATION RATE: Sed Rate: >140 mm/h — ABNORMAL HIGH (ref 0–16)

## 2015-06-19 LAB — PREPARE RBC (CROSSMATCH)

## 2015-06-19 MED ORDER — DARBEPOETIN ALFA 100 MCG/0.5ML IJ SOSY
PREFILLED_SYRINGE | INTRAMUSCULAR | Status: AC
  Administered 2015-06-19: 100 ug
  Filled 2015-06-19: qty 0.5

## 2015-06-19 MED ORDER — FLUCONAZOLE 200 MG PO TABS
200.0000 mg | ORAL_TABLET | Freq: Once | ORAL | Status: AC
  Administered 2015-06-19: 200 mg via ORAL
  Filled 2015-06-19: qty 2
  Filled 2015-06-19: qty 1

## 2015-06-19 MED ORDER — ALTEPLASE 2 MG IJ SOLR: 2.0000 mg | Freq: Once | INTRAMUSCULAR | Status: DC | PRN

## 2015-06-19 MED ORDER — DIPHENHYDRAMINE HCL 25 MG PO CAPS
25.0000 mg | ORAL_CAPSULE | Freq: Once | ORAL | Status: DC
  Filled 2015-06-19: qty 1

## 2015-06-19 MED ORDER — SODIUM CHLORIDE 0.9 % IV SOLN
8.0000 mg/h | INTRAVENOUS | Status: DC
  Administered 2015-06-20: 8 mg/h via INTRAVENOUS
  Filled 2015-06-19 (×4): qty 80

## 2015-06-19 MED ORDER — PENTAFLUOROPROP-TETRAFLUOROETH EX AERO: 1.0000 "application " | INHALATION_SPRAY | CUTANEOUS | Status: DC | PRN

## 2015-06-19 MED ORDER — AMOXICILLIN-POT CLAVULANATE 500-125 MG PO TABS
1.0000 | ORAL_TABLET | Freq: Two times a day (BID) | ORAL | Status: DC
  Administered 2015-06-19 – 2015-06-20 (×2): 500 mg via ORAL
  Filled 2015-06-19 (×2): qty 1

## 2015-06-19 MED ORDER — PANTOPRAZOLE SODIUM 40 MG PO TBEC
40.0000 mg | DELAYED_RELEASE_TABLET | Freq: Two times a day (BID) | ORAL | Status: DC
  Administered 2015-06-19: 40 mg via ORAL
  Filled 2015-06-19: qty 1

## 2015-06-19 MED ORDER — LORAZEPAM 2 MG/ML IJ SOLN
0.5000 mg | Freq: Once | INTRAMUSCULAR | Status: AC
  Administered 2015-06-19: 0.5 mg via INTRAVENOUS
  Filled 2015-06-19: qty 1

## 2015-06-19 MED ORDER — SODIUM CHLORIDE 0.9 % IV SOLN: 100.0000 mL | INTRAVENOUS | Status: DC | PRN

## 2015-06-19 MED ORDER — LIDOCAINE HCL (PF) 1 % IJ SOLN: 5.0000 mL | INTRAMUSCULAR | Status: DC | PRN

## 2015-06-19 MED ORDER — SODIUM CHLORIDE 0.9 % IV SOLN
80.0000 mg | Freq: Once | INTRAVENOUS | Status: AC
  Administered 2015-06-20: 80 mg via INTRAVENOUS
  Filled 2015-06-19: qty 80

## 2015-06-19 MED ORDER — SODIUM CHLORIDE 0.9 % IV SOLN: Freq: Once | INTRAVENOUS | Status: DC

## 2015-06-19 MED ORDER — HEPARIN SODIUM (PORCINE) 1000 UNIT/ML DIALYSIS: 1000.0000 [IU] | INTRAMUSCULAR | Status: DC | PRN

## 2015-06-19 MED ORDER — LIDOCAINE-PRILOCAINE 2.5-2.5 % EX CREA: 1.0000 "application " | TOPICAL_CREAM | CUTANEOUS | Status: DC | PRN

## 2015-06-19 MED ORDER — ACETAMINOPHEN 325 MG PO TABS
650.0000 mg | ORAL_TABLET | Freq: Once | ORAL | Status: DC
  Filled 2015-06-19: qty 2

## 2015-06-19 MED ORDER — PANTOPRAZOLE SODIUM 40 MG IV SOLR: 40.0000 mg | Freq: Two times a day (BID) | INTRAVENOUS | Status: DC

## 2015-06-19 MED ORDER — FLUCONAZOLE 100 MG PO TABS
100.0000 mg | ORAL_TABLET | Freq: Every day | ORAL | Status: DC
  Administered 2015-06-20: 100 mg via ORAL
  Filled 2015-06-19: qty 1

## 2015-06-19 NOTE — Progress Notes (Signed)
[facility] Progress Note  Assessment/Plan: 1. SIRS - Tmax 100.6 ? Aspiration pneumonitis CXR neg on admission - he also had fever spike 5/23 to 101.5 CXR neg BC were neg - no abx given during that event; now on empiric Vanc and Zosyn 2. ESRD - TTS - HD today - Na 134 K 4.7  3. Anemia - transfused 2 units PRBC last week; on Aranesp 100 q Tuesday- received 5/23 only ;hgb 7.9 down to 7.2  trending down; tsat 34% and ferritin 1822 5/23 - plan continue ESA same dose - transfuse 2 unit PRBC- given MM meds, blood bank states it may not be ready in time to given on HD - may need to be given each unit over 3 hr on the floor 4. Secondary hyperparathyroidism - calcitriol 0.25 q HD; renvela 800 1 ac - started last admission 5. HTN/volume - CXR neg for volume - BP up today 160s - goal ^ 3 L 6. Nutrition on CL - alb 2.5 added Resource - hopefully we can advance today 7. MM - refractory to multiple regimens - still getting Velcade/daratumumab infusions perioridicly at [facility] and s/p SCT 2012-  8. Thrombocytopenia -plts 70-80s - follow 9. Hiccups - thorazine/reglan 10. DM - per primary 11. DNR  Myriam Jacobson, PA-C [place] 06/19/2015,9:03 AM  LOS: 3 days   Pt seen, examined and agree w A/P as above. Unclear source of recurrent fevers.  May have recurrent esophagitis (candida esoph in April '17), having heartburn-type symptoms.  Will d/w primary.  Kelly Splinter MD [facility] pager [phone]    cell [phone] 06/19/2015, 9:51 AM    Subjective:   Minimal vomiting.   Objective Filed Vitals:   06/19/15 0752 06/19/15 0758 06/19/15 0803 06/19/15 0830  BP: 147/67 155/86 157/83 166/84  Pulse: 93 94 92 90  Temp: 99 F (37.2 C) 98.1 F (36.7 C)    TempSrc: Oral Oral    Resp: 18 18    Height:      Weight:  63.7 kg (140 lb 6.9 oz)    SpO2: 98% 100%     Physical Exam General: NAD somewhat flat affect  Heart: RRR Lungs:  no rhonchi/rales Abdomen: soft NT Extremities: no LE edema Dialysis Access: left upper AVF + bruit maturing and right IJ - also has left port  Dialysis Orders: Tuesday/Thursday/Saturday, 4 hours, 180 dialyzer, blood flow rate 400, dialysate flow 800, EDW 62.5 kg, 3K/2.25 calcium, no UF profile, no sodium modeling. No heparin. Hectorol 1 g 3 times a week, Mircera 200 g IV every 2 weeks. Tunneled RIJ dialysis catheter with maturing left BCF.  Additional Objective Labs: Basic Metabolic Panel:  Recent Labs Lab 06/14/15 0649 06/16/15 1934 06/17/15 0245  NA 137 133* 134*  K 3.8 4.5 4.7  CL 97* 95* 97*  CO2 23 24 26   GLUCOSE 181* 179* 141*  BUN 57* 15 20  CREATININE 8.06* 3.77* 4.69*  CALCIUM 8.7* 8.1* 7.8*  PHOS 6.3*  --   --    Liver Function Tests:  Recent Labs Lab 06/14/15 0649  ALBUMIN 2.5*   CBC:  Recent Labs Lab 06/14/15 0649 06/15/15 0520 06/16/15 1934 06/17/15 0245 06/19/15 0412  WBC 4.3 4.3 4.5 4.2 3.5*  NEUTROABS  --   --   --   --  3.0  HGB 9.3* 8.3* 8.6* 7.9* 7.2*  HCT 28.6* 26.4* 27.1* 25.1* 22.9*  MCV 81.0 82.0 83.1 83.7 82.1  PLT 82* 82*  89* 72* 73*   Blood Culture    Component Value Date/Time   SDES BLOOD RIGHT HAND 06/16/2015 2230   SPECREQUEST IN PEDIATRIC BOTTLE 1CC 06/16/2015 2230   CULT NO GROWTH 2 DAYS 06/16/2015 2230   REPTSTATUS PENDING 06/16/2015 2230    Cardiac Enzymes:  Recent Labs Lab 06/15/15 0520 06/15/15 1026 06/18/15 1840 06/18/15 2315 06/19/15 0412  TROPONINI 0.04* 0.03 0.04* 0.03 0.05*   CBG:  Recent Labs Lab 06/18/15 1709 06/18/15 1953 06/19/15 0007 06/19/15 0400 06/19/15 0742  GLUCAP 156* 108* 97 96 105*    Studies/Results: Dg Chest 2 View  06/18/2015  CLINICAL DATA:  Dyspnea. Chest discomfort 3 hours. Hypertension and diabetes. Dialysis patient. EXAM: CHEST  2 VIEW COMPARISON:  06/16/2015. FINDINGS: Right jugular dual-lumen dialysis catheter in the lower SVC. Port-A-Cath tip in the SVC. These are  unchanged. Negative for pulmonary edema. No infiltrate or effusion. Lungs are clear. IMPRESSION: No active cardiopulmonary disease. Electronically Signed   By: Franchot Gallo M.D.   On: 06/18/2015 18:16   Medications:   . sodium chloride   Intravenous Once  . acetaminophen  650 mg Oral Once  . calcitRIOL  0.25 mcg Oral Q T,Th,Sa-HD  . darbepoetin (ARANESP) injection - DIALYSIS  100 mcg Intravenous Q Tue-HD  . diphenhydrAMINE  25 mg Oral Once  . feeding supplement  1 Container Oral BID BM  . feeding supplement (ENSURE ENLIVE)  237 mL Oral BID BM  . heparin  5,000 Units Subcutaneous Q8H  . insulin aspart  0-9 Units Subcutaneous Q4H  . metoCLOPramide (REGLAN) injection  5 mg Intravenous Q6H  . metoprolol tartrate  12.5 mg Oral BID  . montelukast  10 mg Oral QHS  . multivitamin  1 tablet Oral QHS  . pantoprazole (PROTONIX) IV  40 mg Intravenous Q12H  . piperacillin-tazobactam (ZOSYN)  IV  2.25 g Intravenous Q8H  . sevelamer carbonate  800 mg Oral TID WC  . simvastatin  10 mg Oral QHS  . sodium chloride flush  3 mL Intravenous Q12H  . sodium chloride flush  3 mL Intravenous Q12H  . valACYclovir  500 mg Oral Daily

## 2015-06-19 NOTE — Care Management Important Message (Signed)
Important Message  Patient Details  Name: Eddie Thomas MRN: [id number] Date of Birth: 1945-06-21   Medicare Important Message Given:  Yes    Loann Quill 06/19/2015, 8:03 AM

## 2015-06-19 NOTE — Progress Notes (Signed)
PROGRESS NOTE  Eddie Thomas XHB:[id number] DOB: 19-Sep-1945 DOA: 06/16/2015 PCP: [name]  Brief History:  70 y.o. male with medical history significant of multiple myeloma, IgA subtype, currently undergoing treatment with Velcade/Daratumumab at the cancer center, follows Dr. Julien Nordmann of medical oncology, also having a history of chronic kidney disease (with baseline creatinine near 1.6-2.0), hypertension, diabetes mellitus, presented as a transfer from the cancer center for acute renal failure with creatinine of 10 on 06/01/15. Nephrology was consulted, and the patient was initiated on dialysis during his last hospitalization. A tunnel dialysis catheter and AVF was placed by [facility]. He was discharged in stable condition on 06/15/15. Unfortunately, the patient developed a fever up to 101.75F with associated nausea and vomiting after his dialysis session on 06/15/2015. After admission, the patient has had a temperature of 100.68F with tachycardia and soft blood pressures. Nephrology has been consulted to assist with management.  Summary of previous hospitalization--Intractable hiccups- unclear etiology. ABLA complicating chronic anemia > multiple antibodies on type and screen- plan for 2 PRBC's on 5/23. Acute AMS developed on 06/12/15 which was likely multifactorial including medications and possible new infection as pt had a fever up to 101.6. Since then, his mental status has improved and returned back to baseline. On 06/14/15, pt c/o of cp and sob. With his hx of DVT, V/QScan was obtained and it was negative. In addition, the patient had coffee grounds emesis during hospitalization. He was evaluated by [facility]. They did not feel the patient needed an additional EGD at this time as he has had recent EGD on 05/16/2015. His PPI was increased to twice a day, and Reglan was added.  Assessment/Plan: SIRS/FUO -presents with fever and tachycardia -Initially suspected aspiration  pneumonitis given the patient's recurrent hiccups and vomiting vs neoplastic fever from myeloma (refractory to multiple chemo regimens and SCT in 2012)  -??candida esophagitis-->empiric fluconazole (5/30>>>) -06/16/15-blood culture remains neg -Continue empiric antibiotics--->d/c vanco (last dose on 5/27) -d/c zosyn -Augmentin x 5 more doses -ESR>140 -CT chest, abd/pelvis -06/07/15 echo--EF 55-65%, grade 2 DD, mod TR -Lactic acid 0.99 -Procalcitonin 1.63  Intractable hiccups, intermittent vomiting/coffee grounds emesis/hematemesis - Briefly started on Ativan without significant improvement. Then was changed to baclofen (received 3 doses)-this was stopped on 5/22 due to concern for side effects in renal failure. Started on Thorazine 5/22.  -All meds stopped due to altered mental status  -Intermittent hiccups continue since discontinuation of hypnotic medications -After his mental status improved, the patient was restarted back on po Thorazine with some improvement -06/15/15-[facility] was consulted. They did not feel the patient needed any additional procedures at this time. PPI was increased to twice a day and Reglan was added.  -06/19/15-hematemesis x 1--reconsulted [facility] (pt follows Dr. Vevelyn Pat see am 5/31; restarted fluconazole -05/16/15 EGD--monilial esophagitis, benign esophageal stenosis--dilated -Hgb trending down 9.3-->7.2 in past 5 days  Acute renal failure complicating chronic kidney disease-->ESRD - Nephrology was consulted. 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 Patient's myeloma is refractory to multiple different chemotherapy combinations and do not believe that his chemotherapy or pheresis is going to help his renal function. - Nephrology suggested palliative care consultation, to be pursued by oncology as outpatient. - Patient undergoing hemodialysis per  nephrology. Last HD on 5/22. [facility] and AVF placed by [facility]. HD initiated by nephrology. Outpatient TTS dialysis >has  been arranged by nephrology-->[place]  Acute encephalopathy -developed between 5/22 night and 5/23 AM -likely multifactorial including infection, meds, worsen anemia -new fever on 06/12/15 of 101.6--blood cultures negative at the time of discharge -06/12/15 CT brain negative -Discontinued Thorazine, baclofen, opioids, gabapentin, other hypnotics -check ammonia--24  -B12--266 -TSH--0.493, Free T4--1.04 -with B12 in low normal range-->started supplementation -Mental status presently at baseline  Atypical Chest Pain -developed 06/14/15 -with hx of DVT left leg, myeloma and new tachycardia-->VQ scan neg -EKG-no concerning ischemic changes -cycle troponins--unremarkable; minimal elevation likely due to ESRD -suspect GERD/esophagitis from vomiting -try tums prn  Multiple Myeloma: - Follows with Dr Julien Nordmann.  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 - Transfuse 2 units of PRBC 5/23 and follow posttransfusion hemoglobin. Blood bank called and stated patient has multiple antibodies related to his myeloma medications but director of blood bank has cleared the available blood for transfusion. Verified same with oncologist who has had similar experience in the past. -total 5 units received during previous admission (5/12-5/26) -06/19/15--transfuse 2 units PRBC  Pancytopenia - Secondary to multiple myeloma and related cancer chemotherapy. - Anemia management as above. Thrombocytopenia stable.  NSVT: - Patient had 15 beats of nonsustained V. tach, hypomagnesemia corrected, 2-D echo done 11/2015 with EF 55-60% - Continue with telemetry monitor - May be related to renal failure, electrolyte abnormalities. Repeat 2-D echo: Normal EF. -Continue metoprolol 12.5 mg  twice a day  History of DVT - Xarelto DC'ed given renal function (took Xarelto approximately one year) - Discussed with Dr. Julien Nordmann on 5/17 and he had recommended continued anticoagulation. Attempted IV heparin bridging and Coumadin but each time IV heparin was initiated, patient had bleeding from [facility] site and hence all anticoagulation was stopped. Not good anticoagulation candidate due to bleeding complications. Discussed with Dr. Julien Nordmann on 5/21.  Essential hypertension - ACEI stopped due to acute renal failure.  -Blood pressure was controlled, but gradually increased during hospitalization -Continue low-dose metoprolol tartrate 12.5 mg twice a day  Diabetes mellitus type 2 -CBGs remained largely controlled -Discontinue Actos -was sent home with low-dose glipizide last admit  -novolog sliding scale for now  Disposition Plan: Home in 1-2 days  Family Communication: Wife and Daughter updated at beside 5/30--total time 35 min; >50% spent couseling and coordinating care  Consultants: Renal  Code Status: FULL  Subjective: Pt had episode of hematemesis--small amount.  No f/c, cp, sob, abd pain, HA.  Still intermitten hiccups  Objective: Filed Vitals:   06/19/15 1230 06/19/15 1245 06/19/15 1313 06/19/15 1700  BP: 131/70 124/65 117/79 131/57  Pulse: 88 93 97 103  Temp:  97.9 F (36.6 C) 97.9 F (36.6 C) 100.9 F (38.3 C)  TempSrc:  Oral Oral Oral  Resp:  _0 Height:      Weight:  61.7 kg (136 lb 0.4 oz)    SpO2:  100% 100% 100%    Intake/Output Summary (Last 24 hours) at 06/19/15 1740 Last data filed at 06/19/15 1245  Gross per 24 hour  Intake    335 ml  Output   2635 ml  Net  -2300 ml   Weight change: 0.6 kg (1 lb 5.2 oz) Exam:   General:  Pt is alert, follows commands appropriately, not in acute distress  HEENT: No icterus, No thrush, No neck mass, [place]/AT  Cardiovascular: RRR, S1/S2, no rubs, no gallops  Respiratory: CTA  bilaterally, no wheezing, no  crackles, no rhonchi  Abdomen: Soft/+BS, non tender, non distended, no guarding  Extremities: No edema, No lymphangitis, No petechiae, No rashes, no synovitis   Data Reviewed: I have personally reviewed following labs and imaging studies Basic Metabolic Panel:  Recent Labs Lab 06/13/15 0500 06/14/15 0649 06/16/15 1934 06/17/15 0245 06/19/15 1006  NA 137 137 133* 134* 135  K 4.1 3.8 4.5 4.7 3.8  CL 97* 97* 95* 97* 96*  CO2 _0 GLUCOSE 139* 181* 179* 141* 120*  BUN 35* 57* _1 CREATININE 5.50* 8.06* 3.77* 4.69* 3.70*  CALCIUM 8.0* 8.7* 8.1* 7.8* 8.0*  PHOS  --  6.3*  --   --  1.7*   Liver Function Tests:  Recent Labs Lab 06/14/15 0649 06/19/15 1006  ALBUMIN 2.5* 2.2*   No results for input(s): LIPASE, AMYLASE in the last 168 hours.  Recent Labs Lab 06/13/15 1135  AMMONIA 24   Coagulation Profile:  Recent Labs Lab 06/13/15 0500 06/14/15 0442 06/15/15 0520  INR 1.77* 1.47 1.44   CBC:  Recent Labs Lab 06/14/15 0649 06/15/15 0520 06/16/15 1934 06/17/15 0245 06/19/15 0412  WBC 4.3 4.3 4.5 4.2 3.5*  NEUTROABS  --   --   --   --  3.0  HGB 9.3* 8.3* 8.6* 7.9* 7.2*  HCT 28.6* 26.4* 27.1* 25.1* 22.9*  MCV 81.0 82.0 83.1 83.7 82.1  PLT 82* 82* 89* 72* 73*   Cardiac Enzymes:  Recent Labs Lab 06/15/15 0520 06/15/15 1026 06/18/15 1840 06/18/15 2315 06/19/15 0412  TROPONINI 0.04* 0.03 0.04* 0.03 0.05*   BNP: Invalid input(s): POCBNP CBG:  Recent Labs Lab 06/18/15 1953 06/19/15 0007 06/19/15 0400 06/19/15 0742 06/19/15 1316  GLUCAP 108* 97 96 105* 86   HbA1C: No results for input(s): HGBA1C in the last 72 hours. Urine analysis:    Component Value Date/Time   COLORURINE YELLOW 06/01/2015 [place] 06/01/2015 1540   LABSPEC 1.008 06/01/2015 1540   PHURINE 6.0 06/01/2015 1540   GLUCOSEU NEGATIVE 06/01/2015 1540   HGBUR TRACE* 06/01/2015 1540   BILIRUBINUR NEGATIVE 06/01/2015 1540   KETONESUR NEGATIVE  06/01/2015 1540   PROTEINUR 30* 06/01/2015 1540   UROBILINOGEN 1.0 01/16/2012 1902   NITRITE NEGATIVE 06/01/2015 1540   LEUKOCYTESUR NEGATIVE 06/01/2015 1540   Sepsis Labs: _2 (procalcitonin:4,lacticidven:4) ) Recent Results (from the past 240 hour(s))  Culture, blood (Routine X 2) w Reflex to ID Panel     Status: None   Collection Time: 06/12/15  8:58 PM  Result Value Ref Range Status   Specimen Description RIGHT ANTECUBITAL  Final   Special Requests IN BOTH AEROBIC AND ANAEROBIC BOTTLES 5CC  Final   Culture NO GROWTH 5 DAYS  Final   Report Status 06/17/2015 FINAL  Final  Culture, blood (Routine X 2) w Reflex to ID Panel     Status: None   Collection Time: 06/12/15  9:07 PM  Result Value Ref Range Status   Specimen Description RIGHT ANTECUBITAL  Final   Special Requests IN BOTH AEROBIC AND ANAEROBIC BOTTLES 5CC  Final   Culture NO GROWTH 5 DAYS  Final   Report Status 06/17/2015 FINAL  Final  Blood culture (routine x 2)     Status: None (Preliminary result)   Collection Time: 06/16/15 10:11 PM  Result Value Ref Range Status   Specimen Description BLOOD RIGHT ANTECUBITAL  Final   Special Requests BOTTLES DRAWN AEROBIC AND ANAEROBIC 5CC   Final  Culture NO GROWTH 3 DAYS  Final   Report Status PENDING  Incomplete  Blood culture (routine x 2)     Status: None (Preliminary result)   Collection Time: 06/16/15 10:30 PM  Result Value Ref Range Status   Specimen Description BLOOD RIGHT HAND  Final   Special Requests IN PEDIATRIC BOTTLE 1CC  Final   Culture NO GROWTH 3 DAYS  Final   Report Status PENDING  Incomplete     Scheduled Meds: . sodium chloride   Intravenous Once  . acetaminophen  650 mg Oral Once  . amoxicillin-clavulanate  1 tablet Oral BID  . calcitRIOL  0.25 mcg Oral Q T,Th,Sa-HD  . darbepoetin (ARANESP) injection - DIALYSIS  100 mcg Intravenous Q Tue-HD  . diphenhydrAMINE  25 mg Oral Once  . feeding supplement  1 Container Oral BID BM  . feeding supplement  (ENSURE ENLIVE)  237 mL Oral BID BM  . [START ON 06/20/2015] fluconazole  100 mg Oral Daily  . heparin  5,000 Units Subcutaneous Q8H  . insulin aspart  0-9 Units Subcutaneous Q4H  . metoCLOPramide (REGLAN) injection  5 mg Intravenous Q6H  . metoprolol tartrate  12.5 mg Oral BID  . montelukast  10 mg Oral QHS  . multivitamin  1 tablet Oral QHS  . pantoprazole  40 mg Oral BID AC  . sevelamer carbonate  800 mg Oral TID WC  . simvastatin  10 mg Oral QHS  . sodium chloride flush  3 mL Intravenous Q12H  . sodium chloride flush  3 mL Intravenous Q12H  . valACYclovir  500 mg Oral Daily   Continuous Infusions:   Procedures/Studies: Dg Chest 2 View  06/18/2015  CLINICAL DATA:  Dyspnea. Chest discomfort 3 hours. Hypertension and diabetes. Dialysis patient. EXAM: CHEST  2 VIEW COMPARISON:  06/16/2015. FINDINGS: Right jugular dual-lumen dialysis catheter in the lower SVC. Port-A-Cath tip in the SVC. These are unchanged. Negative for pulmonary edema. No infiltrate or effusion. Lungs are clear. IMPRESSION: No active cardiopulmonary disease. Electronically Signed   By: Franchot Gallo M.D.   On: 06/18/2015 18:16   Dg Chest 2 View  06/16/2015  CLINICAL DATA:  Chest pain EXAM: CHEST  2 VIEW COMPARISON:  06/12/2015 FINDINGS: Right-sided dialysis catheter and left-sided chest wall port are again seen and stable. Cardiac shadow is stable. The lungs are well aerated bilaterally without focal infiltrate or sizable effusion. Postsurgical changes in the right humerus are seen. IMPRESSION: No acute abnormality noted. Electronically Signed   By: Inez Catalina M.D.   On: 06/16/2015 20:35   Ct Head Wo Contrast  06/12/2015  CLINICAL DATA:  Confusion. Tearful. Undergoing treatment for multiple myeloma. Altered mental status. EXAM: CT HEAD WITHOUT CONTRAST TECHNIQUE: Contiguous axial images were obtained from the base of the skull through the vertex without intravenous contrast. COMPARISON:  None. FINDINGS: No acute cortical  infarct, hemorrhage, or mass lesion is present. The ventricles are of normal size. No significant extra-axial fluid collection is evident. The paranasal sinuses and mastoid air cells are clear. The calvarium is intact. The globes and orbits are intact. No significant extracranial soft tissue lesion is present. IMPRESSION: Negative CT of the head. Electronically Signed   By: San Morelle M.D.   On: 06/12/2015 14:19   US Renal  06/01/2015  CLINICAL DATA:  Acute kidney injury, diabetes mellitus, hypertension and EXAM: RENAL / URINARY TRACT ULTRASOUND COMPLETE COMPARISON:  Abdominal ultrasound 03/13/2015 FINDINGS: Right Kidney: Length: 11.8 cm. Normal cortical thickness. Increased cortical echogenicity. No  hydronephrosis or shadowing calcification. Two cysts identified at mid RIGHT kidney 1.6 x 1.4 x 1.5 cm and at the mid inferior pole 2.9 x 2.5 x 2.4 cm, smaller lesion demonstrating scattered internal echogenicity/complication. Left Kidney: Length: 13.9 cm. Normal cortical thickness. Increased cortical echogenicity. No mass, hydronephrosis or shadowing calcification. Bladder: Partially distended, unremarkable. IMPRESSION: Medical renal disease changes of both kidneys. 2 RIGHT renal cysts, larger 2.9 cm greatest size simple in character with smaller lesion 1.6 cm greatest size complicated by scattered internal echogenicity. Either followup ultrasound in 4-6 months to demonstrate stability or characterization by followup non emergent MRI after discharge when patient can optimally cooperate with MR imaging recommended to exclude cystic renal neoplasm. Electronically Signed   By: Lavonia Dana M.D.   On: 06/01/2015 13:46   Nm Pulmonary Perf And Vent  06/14/2015  CLINICAL DATA:  70 year old male with chest pain and shortness of breath. History of myeloma and prior DVT. EXAM: NUCLEAR MEDICINE VENTILATION - PERFUSION LUNG SCAN TECHNIQUE: Ventilation images were obtained in multiple projections using inhaled  aerosol [id number]. Perfusion images were obtained in multiple projections after intravenous injection of Tc-971mAA. RADIOPHARMACEUTICALS:  31.0 mCi Technetium-9913mPA aerosol inhalation and 4.2 mCi Technetium-42m49m IV COMPARISON:  06/12/2015 chest radiograph. FINDINGS: Ventilation: No focal ventilation defect. Perfusion: No wedge shaped peripheral perfusion defects to suggest acute pulmonary embolism. IMPRESSION: No evidence of acute pulmonary embolus. Electronically Signed   By: JeffMargarette Canada.   On: 06/14/2015 16:59   Ir Fluoro Guide Cv Line Right  06/05/2015  INDICATION: Acute kidney injury, multiple myeloma, renal failure EXAM: ULTRASOUND GUIDANCE FOR VASCULAR ACCESS RIGHT INTERNAL JUGULAR PERMANENT HEMODIALYSIS CATHETER Date:  5/16/20175/16/2017 3:53 pm Radiologist:  M. TrevDaryll Brod Guidance:  Ultrasound and fluoroscopic FLUOROSCOPY TIME:  Fluoroscopy Time:  30 seconds (2 mGy). MEDICATIONS: 1% lidocaine locally, 2 g Ancef administered within 1 hour of the procedure ANESTHESIA/SEDATION: Versed 0.5 mg IV; Fentanyl 25 mcg IV; Moderate Sedation Time:  15 minutes The patient was continuously monitored during the procedure by the interventional radiology nurse under my direct supervision. CONTRAST:  None COMPLICATIONS: None immediate. PROCEDURE: Informed consent was obtained from the patient following explanation of the procedure, risks, benefits and alternatives. The patient understands, agrees and consents for the procedure. All questions were addressed. A time out was performed. Maximal barrier sterile technique utilized including caps, mask, sterile gowns, sterile gloves, large sterile drape, hand hygiene, and 2% chlorhexidine scrub. Under sterile conditions and local anesthesia, right internal jugular micropuncture venous access was performed with ultrasound. Images were obtained for documentation of the patent right internal jugular vein. A guide wire was inserted followed by a transitional  dilator. Next, a 0.035 guidewire was advanced into the IVC with a 5-French catheter. Measurements were obtained from the right venotomy site to the proximal right atrium. In the right infraclavicular chest, a subcutaneous tunnel was created under sterile conditions and local anesthesia. 1% lidocaine with epinephrine was utilized for this. The 19 cm tip to cuff palindrome catheter was tunneled subcutaneously to the venotomy site and inserted into the SVC/RA junction through a valved peel-away sheath. Position was confirmed with fluoroscopy. Images were obtained for documentation. Blood was aspirated from the catheter followed by saline and heparin flushes. The appropriate volume and strength of heparin was instilled in each lumen. Caps were applied. The catheter was secured at the tunnel site with Gelfoam and a pursestring suture. The venotomy site was closed with subcuticular Vicryl suture. Dermabond was applied to the  small right neck incision. A dry sterile dressing was applied. The catheter is ready for use. No immediate complications. IMPRESSION: Ultrasound and fluoroscopically guided right internal jugular tunneled hemodialysis catheter (19 cm tip to cuff palindrome catheter). Electronically Signed   By: Jerilynn Mages.  Shick M.D.   On: 06/05/2015 16:34   Ir US Guide Vasc Access Right  06/05/2015  INDICATION: Acute kidney injury, multiple myeloma, renal failure EXAM: ULTRASOUND GUIDANCE FOR VASCULAR ACCESS RIGHT INTERNAL JUGULAR PERMANENT HEMODIALYSIS CATHETER Date:  5/16/20175/16/2017 3:53 pm Radiologist:  M. Daryll Brod, MD Guidance:  Ultrasound and fluoroscopic FLUOROSCOPY TIME:  Fluoroscopy Time:  30 seconds (2 mGy). MEDICATIONS: 1% lidocaine locally, 2 g Ancef administered within 1 hour of the procedure ANESTHESIA/SEDATION: Versed 0.5 mg IV; Fentanyl 25 mcg IV; Moderate Sedation Time:  15 minutes The patient was continuously monitored during the procedure by the interventional radiology nurse under my direct  supervision. CONTRAST:  None COMPLICATIONS: None immediate. PROCEDURE: Informed consent was obtained from the patient following explanation of the procedure, risks, benefits and alternatives. The patient understands, agrees and consents for the procedure. All questions were addressed. A time out was performed. Maximal barrier sterile technique utilized including caps, mask, sterile gowns, sterile gloves, large sterile drape, hand hygiene, and 2% chlorhexidine scrub. Under sterile conditions and local anesthesia, right internal jugular micropuncture venous access was performed with ultrasound. Images were obtained for documentation of the patent right internal jugular vein. A guide wire was inserted followed by a transitional dilator. Next, a 0.035 guidewire was advanced into the IVC with a 5-French catheter. Measurements were obtained from the right venotomy site to the proximal right atrium. In the right infraclavicular chest, a subcutaneous tunnel was created under sterile conditions and local anesthesia. 1% lidocaine with epinephrine was utilized for this. The 19 cm tip to cuff palindrome catheter was tunneled subcutaneously to the venotomy site and inserted into the SVC/RA junction through a valved peel-away sheath. Position was confirmed with fluoroscopy. Images were obtained for documentation. Blood was aspirated from the catheter followed by saline and heparin flushes. The appropriate volume and strength of heparin was instilled in each lumen. Caps were applied. The catheter was secured at the tunnel site with Gelfoam and a pursestring suture. The venotomy site was closed with subcuticular Vicryl suture. Dermabond was applied to the small right neck incision. A dry sterile dressing was applied. The catheter is ready for use. No immediate complications. IMPRESSION: Ultrasound and fluoroscopically guided right internal jugular tunneled hemodialysis catheter (19 cm tip to cuff palindrome catheter).  Electronically Signed   By: Jerilynn Mages.  Shick M.D.   On: 06/05/2015 16:34   Dg Chest Port 1 View  06/12/2015  CLINICAL DATA:  One day of fever, history of multiple myeloma, bone marrow transplant with complication, dialysis dependent renal failure. EXAM: PORTABLE CHEST 1 VIEW COMPARISON:  No recent chest x-ray in PACs comparison is made to a chest x-ray of November 21, 2011 FINDINGS: The lungs are adequately inflated. There is no focal infiltrate. There is no pleural effusion or pneumothorax. A dialysis catheter is in place via the right internal jugular approach with the tip projecting over the midportion of the SVC. A Port-A-Cath appliance is present with its tip projecting over the midportion of the SVC. The heart is normal in size. The pulmonary vascularity is not engorged. The bony thorax exhibits no acute abnormality. The patient has undergone previous plate and screw fixation for proximal humeral fracture. IMPRESSION: There is no evidence of pneumonia. No acute cardiopulmonary abnormality  is observed. Electronically Signed   By:   [place] M.D.   On: 06/12/2015 16:50   Dg Abd Portable 1v  06/09/2015  CLINICAL DATA:  70 year old with acute nausea and vomiting. EXAM: PORTABLE ABDOMEN - 1 VIEW COMPARISON:  None. FINDINGS: Bowel gas pattern unremarkable without evidence of obstruction or significant ileus. Expected stool burden in the colon. Phleboliths low in the left side of the pelvis. No visible opaque urinary tract calculi. Degenerative changes involving the lumbar spine and sacroiliac joints. IMPRESSION: No acute abdominal abnormality. Electronically Signed   By: Evangeline Dakin M.D.   On: 06/09/2015 22:23    , , DO  [facility] Pager [phone]  If 7PM-7AM, please contact night-coverage [email or web address] Password [email or web address] 06/19/2015, 5:40 PM   LOS: 3 days

## 2015-06-20 ENCOUNTER — Inpatient Hospital Stay (HOSPITAL_COMMUNITY): Payer: Medicare Other

## 2015-06-20 ENCOUNTER — Encounter (HOSPITAL_COMMUNITY): Payer: Self-pay | Admitting: Radiology

## 2015-06-20 DIAGNOSIS — K920 Hematemesis: Secondary | ICD-10-CM

## 2015-06-20 DIAGNOSIS — R066 Hiccough: Secondary | ICD-10-CM

## 2015-06-20 DIAGNOSIS — E43 Unspecified severe protein-calorie malnutrition: Secondary | ICD-10-CM

## 2015-06-20 DIAGNOSIS — R1314 Dysphagia, pharyngoesophageal phase: Secondary | ICD-10-CM

## 2015-06-20 DIAGNOSIS — R110 Nausea: Secondary | ICD-10-CM

## 2015-06-20 LAB — GLUCOSE, CAPILLARY
GLUCOSE-CAPILLARY: 141 mg/dL — AB (ref 65–99)
GLUCOSE-CAPILLARY: 150 mg/dL — AB (ref 65–99)
GLUCOSE-CAPILLARY: 153 mg/dL — AB (ref 65–99)
GLUCOSE-CAPILLARY: 164 mg/dL — AB (ref 65–99)
Glucose-Capillary: 151 mg/dL — ABNORMAL HIGH (ref 65–99)
Glucose-Capillary: 147 mg/dL — ABNORMAL HIGH (ref 65–99)

## 2015-06-20 LAB — PROTEIN ELECTROPHORESIS, SERUM
A/G Ratio: 0.6 — ABNORMAL LOW (ref 0.7–1.7)
Albumin ELP: 2.7 g/dL — ABNORMAL LOW (ref 2.9–4.4)
Alpha-1-Globulin: 0.4 g/dL (ref 0.0–0.4)
Alpha-2-Globulin: 1 g/dL (ref 0.4–1.0)
Beta Globulin: 1.9 g/dL — ABNORMAL HIGH (ref 0.7–1.3)
GLOBULIN, TOTAL: 4.6 g/dL — AB (ref 2.2–3.9)
Gamma Globulin: 1.4 g/dL (ref 0.4–1.8)
M-SPIKE, %: 1.1 g/dL — AB
TOTAL PROTEIN ELP: 7.3 g/dL (ref 6.0–8.5)

## 2015-06-20 LAB — BETA 2 MICROGLOBULIN, SERUM: BETA 2 MICROGLOBULIN: 36 mg/L — AB (ref 0.6–2.4)

## 2015-06-20 LAB — CBC
HCT: 28.3 % — ABNORMAL LOW (ref 39.0–52.0)
Hemoglobin: 8.9 g/dL — ABNORMAL LOW (ref 13.0–17.0)
MCH: 25.4 pg — AB (ref 26.0–34.0)
MCHC: 31.4 g/dL (ref 30.0–36.0)
MCV: 80.9 fL (ref 78.0–100.0)
PLATELETS: 69 10*3/uL — AB (ref 150–400)
RBC: 3.5 MIL/uL — AB (ref 4.22–5.81)
RDW: 16.8 % — ABNORMAL HIGH (ref 11.5–15.5)
WBC: 3.7 10*3/uL — AB (ref 4.0–10.5)

## 2015-06-20 LAB — KAPPA/LAMBDA LIGHT CHAINS
KAPPA FREE LGHT CHN: 0.93 mg/L — AB (ref 3.30–19.40)
Kappa, lambda light chain ratio: 0 — ABNORMAL LOW (ref 0.26–1.65)
LAMDA FREE LIGHT CHAINS: 1994.59 mg/L — AB (ref 5.71–26.30)

## 2015-06-20 LAB — IMMUNOGLOBULINS A/E/G/M, SERUM
IGE (IMMUNOGLOBULIN E), SERUM: 1 [IU]/mL (ref 0–100)
IGG (IMMUNOGLOBIN G), SERUM: 66 mg/dL — AB (ref 700–1600)
IgA: 2687 mg/dL — ABNORMAL HIGH (ref 61–437)
IgM, Serum: <5 mg/dL — ABNORMAL LOW (ref 20–172)

## 2015-06-20 MED ORDER — METOPROLOL TARTRATE 5 MG/5ML IV SOLN
2.5000 mg | Freq: Four times a day (QID) | INTRAVENOUS | Status: DC
  Administered 2015-06-20 – 2015-06-23 (×10): 2.5 mg via INTRAVENOUS
  Filled 2015-06-20 (×10): qty 5

## 2015-06-20 MED ORDER — LORAZEPAM 2 MG/ML IJ SOLN
0.5000 mg | INTRAMUSCULAR | Status: DC | PRN
  Administered 2015-06-20 – 2015-06-21 (×2): 0.5 mg via INTRAVENOUS
  Filled 2015-06-20 (×2): qty 1

## 2015-06-20 MED ORDER — SODIUM CHLORIDE 0.9 % IV SOLN
1.5000 g | Freq: Two times a day (BID) | INTRAVENOUS | Status: DC
  Administered 2015-06-20 – 2015-06-23 (×6): 1.5 g via INTRAVENOUS
  Filled 2015-06-20 (×8): qty 1.5

## 2015-06-20 MED ORDER — SODIUM CHLORIDE 0.9 % IV SOLN
12.5000 mg | Freq: Four times a day (QID) | INTRAVENOUS | Status: DC | PRN
  Filled 2015-06-20 (×2): qty 0.5

## 2015-06-20 MED ORDER — PANTOPRAZOLE SODIUM 40 MG IV SOLR
40.0000 mg | Freq: Two times a day (BID) | INTRAVENOUS | Status: DC
  Administered 2015-06-20: 40 mg via INTRAVENOUS
  Filled 2015-06-20: qty 40

## 2015-06-20 NOTE — Progress Notes (Signed)
[facility] Progress Note  Assessment/Plan: 1. SIRS - Tmax 100.9 ? Source;  he also had fever spike 5/23 to 101.5 CXR neg BC were neg - no abx given during that event; now on empiric Vanc and Zosyn without improvement in fevers- changed to Augmentin and only daily Axyclorvir. CT yesterday - dilated esoph with fluid and debris - now with hematemesis this am. For EDG tomorrow 2. ESRD - TTS - HD Thursday no heparin- 3. Anemia - hgb 7.2 5/30 transfused 2 unit PRBC up to 8.9 (also had 2 units last week during prior admission) Aranesp 100 q Tuesday 4. Secondary hyperparathyroidism - calcitriol 0.25 q HD; renvela 800 1 ac - started last admission P low yesterday 1.6 - have d/c binder - pt not eating 5. HTN/volume - CXR neg for volume - BP up Tuesday - net UF 2.6 BP lower Wed - not eating - plan minimal if any removal Thursday 6. Nutrition  - alb 2.5 added Resource -  7. MM - refractory to multiple regimens - still getting Velcade/daratumumaby at [facility] and hx SCT 2012-  8. Thrombocytopenia -plts 69-80s - follow 9. Hiccups - thorazine/reglan 10. DM - per primary 11.  Hematemesis - on IV PPI, reglan - ? Related to esophogeal findings on CT vs prior issues - GI to see 11. DNR  Myriam Jacobson, PA-C [facility] Beeper [phone] 06/20/2015,9:12 AM  LOS: 4 days   Pt seen, examined, agree w assess/plan as above with additions as indicated.  Kelly Splinter MD [facility] pager [phone]    cell [phone] 06/20/2015, 3:51 PM      Subjective:   Vomiting bloody mucous; not eating Objective Filed Vitals:   06/19/15 1700 06/19/15 2012 06/20/15 0417 06/20/15 0827  BP: 131/57 134/60 141/70 125/66  Pulse: 103 108 104 110  Temp: 100.9 F (38.3 C) 99.8 F (37.7 C) 99.3 F (37.4 C) 99.4 F (37.4 C)  TempSrc: Oral Oral Oral Oral  Resp: 18 18 18 17   Height:      Weight:      SpO2: 100% 100% 98% 100%   Physical Exam General: ill appearrng,  thin, sitting on side of bed sitting with basin Heart: tachy reg Lungs: no rales Abdomen: soft NT Extremities: no LE edema Dialysis Access: left upper AVF + bruit, right IJ  Dialysis Orders: Tuesday/Thursday/Saturday, 4 hours, 180 dialyzer, blood flow rate 400, dialysate flow 800, EDW 62.5 kg, 3K/2.25 calcium, no UF profile, no sodium modeling. No heparin. Hectorol 1 g 3 times a week, Mircera 200 g IV every 2 weeks. Tunneled RIJ dialysis catheter with maturing left BCF.  Additional Objective Labs: Basic Metabolic Panel:  Recent Labs Lab 06/14/15 0649 06/16/15 1934 06/17/15 0245 06/19/15 1006  NA 137 133* 134* 135  K 3.8 4.5 4.7 3.8  CL 97* 95* 97* 96*  CO2 23 24 26 26   GLUCOSE 181* 179* 141* 120*  BUN 57* 15 20 13   CREATININE 8.06* 3.77* 4.69* 3.70*  CALCIUM 8.7* 8.1* 7.8* 8.0*  PHOS 6.3*  --   --  1.7*   Liver Function Tests:  Recent Labs Lab 06/14/15 0649 06/19/15 1006  ALBUMIN 2.5* 2.2*   CBC:  Recent Labs Lab 06/15/15 0520 06/16/15 1934 06/17/15 0245 06/19/15 0412 06/20/15 0430  WBC 4.3 4.5 4.2 3.5* 3.7*  NEUTROABS  --   --   --  3.0  --   HGB 8.3* 8.6* 7.9* 7.2* 8.9*  HCT 26.4* 27.1* 25.1* 22.9* 28.3*  MCV 82.0 83.1 83.7 82.1 80.9  PLT 82* 89* 72* 73* 69*   Blood Culture    Component Value Date/Time   SDES BLOOD RIGHT HAND 06/16/2015 2230   SPECREQUEST IN PEDIATRIC BOTTLE 1CC 06/16/2015 2230   CULT NO GROWTH 3 DAYS 06/16/2015 2230   REPTSTATUS PENDING 06/16/2015 2230    Cardiac Enzymes:  Recent Labs Lab 06/15/15 0520 06/15/15 1026 06/18/15 1840 06/18/15 2315 06/19/15 0412  TROPONINI 0.04* 0.03 0.04* 0.03 0.05*   CBG:  Recent Labs Lab 06/19/15 1706 06/19/15 1947 06/19/15 2358 06/20/15 0405 06/20/15 0825  GLUCAP 164* 140* 141* 150* 153*    Studies/Results: Ct Abdomen Pelvis Wo Contrast  06/20/2015  CLINICAL DATA:  70 year old male with history of fever of unknown origin. EXAM: CT CHEST, ABDOMEN AND PELVIS WITHOUT CONTRAST  TECHNIQUE: Multidetector CT imaging of the chest, abdomen and pelvis was performed following the standard protocol without IV contrast. COMPARISON:  No priors. FINDINGS: CT CHEST FINDINGS Mediastinum/Lymph Nodes: Heart size is normal. There is no significant pericardial fluid, thickening or pericardial calcification. There is atherosclerosis of the thoracic aorta, the great vessels of the mediastinum and the coronary arteries, including calcified atherosclerotic plaque in the left main, left anterior descending, left circumflex and right coronary arteries. No pathologically enlarged mediastinal or hilar lymph nodes. Please note that accurate exclusion of hilar adenopathy is limited on noncontrast CT scans. Esophagus is dilated in contains a large amount of fluid and/or retained solid food. The possibility of esophageal mass is not entirely excluded. No axillary lymphadenopathy. Left-sided internal jugular single-lumen porta cath with tip terminating at the superior cavoatrial junction. Right-sided internal jugular dialysis catheter with tip terminating in the right atrium. Lungs/Pleura: No suspicious appearing pulmonary nodules or masses. No acute consolidative airspace disease. No pleural effusions. Minimal dependent subsegmental atelectasis in the lower lobes of the lungs bilaterally. Musculoskeletal/Soft Tissues: There multiple lytic lesions noted throughout the visualized axial and appendicular skeleton, presumably related to the patient's multiple myeloma. The largest of these is in the T6 vertebral body, where there is what appears to be a chronic compression fracture with approximately 40% loss of anterior and 30% loss of posterior vertebral body height. CT ABDOMEN AND PELVIS FINDINGS Hepatobiliary: Multiple small subcentimeter low-attenuation hepatic lesions are incompletely characterized on today's noncontrast CT examination, but are statistically likely to represent cysts. Unenhanced appearance of the  gallbladder is remarkable for some dependent amorphous intermediate attenuation material which likely represents some biliary sludge. No findings to suggest an acute cholecystitis at this time. Pancreas: No pancreatic mass or peripancreatic inflammatory changes identified on today's noncontrast CT examination. Spleen: Unremarkable. Adrenals/Urinary Tract: 1.2 cm high attenuation lesion in the interpolar region of the right kidney is incompletely characterize, but likely to represent a proteinaceous/hemorrhagic cyst. Other low-attenuation lesions in the interpolar and lower pole region of the right kidney are also incompletely characterized, but favored to represent cysts. Unenhanced appearance of the left kidney is normal. Bilateral adrenal glands are normal in appearance. No hydroureteronephrosis. Urinary bladder is nearly completely decompressed, but otherwise unremarkable in appearance. Stomach/Bowel: The unenhanced appearance of the stomach is normal. There is no pathologic dilatation of small bowel or colon. Normal appendix. Vascular/Lymphatic: Atherosclerosis throughout the abdominal and pelvic vasculature, without evidence of aneurysm. Circumaortic left renal vein (normal anatomical variant) incidentally noted. No lymphadenopathy noted in the abdomen or pelvis on today's noncontrast CT examination. Reproductive: Prostate gland and seminal vesicles are unremarkable in appearance. Other: No significant volume of ascites.  No pneumoperitoneum. Musculoskeletal: Innumerable lytic  lesions are noted throughout the visualized axial and appendicular skeleton, compatible with widespread involvement from patient's known multiple myeloma. The largest of these is in the left side of the sacrum measuring approximately 3.6 x 2.7 cm (image 239 of series 7). IMPRESSION: 1. No definite source for fever of unknown origin identified on today's examination. 2. Widespread osseous involvement from the patient's known multiple  myeloma, with what appears to be a chronic compression fracture at T6, as above. 3. Markedly dilated esophagus which contains fluid and debris. The possibility of an underlying esophageal mass in this region is not excluded, and correlation with nonemergent esophagram is suggested in the near future to better evaluate these findings. 4. Atherosclerosis, including left main and 3 vessel coronary artery disease. Please note that although the presence of coronary artery calcium documents the presence of coronary artery disease, the severity of this disease and any potential stenosis cannot be assessed on this non-gated CT examination. Assessment for potential risk factor modification, dietary therapy or pharmacologic therapy may be warranted, if clinically indicated. 5. Additional incidental findings, as above. Electronically Signed   By: Vinnie Langton M.D.   On: 06/20/2015 08:27   Dg Chest 2 View  06/18/2015  CLINICAL DATA:  Dyspnea. Chest discomfort 3 hours. Hypertension and diabetes. Dialysis patient. EXAM: CHEST  2 VIEW COMPARISON:  06/16/2015. FINDINGS: Right jugular dual-lumen dialysis catheter in the lower SVC. Port-A-Cath tip in the SVC. These are unchanged. Negative for pulmonary edema. No infiltrate or effusion. Lungs are clear. IMPRESSION: No active cardiopulmonary disease. Electronically Signed   By: Franchot Gallo M.D.   On: 06/18/2015 18:16   Ct Chest Wo Contrast  06/20/2015  CLINICAL DATA:  70 year old male with history of fever of unknown origin. EXAM: CT CHEST, ABDOMEN AND PELVIS WITHOUT CONTRAST TECHNIQUE: Multidetector CT imaging of the chest, abdomen and pelvis was performed following the standard protocol without IV contrast. COMPARISON:  No priors. FINDINGS: CT CHEST FINDINGS Mediastinum/Lymph Nodes: Heart size is normal. There is no significant pericardial fluid, thickening or pericardial calcification. There is atherosclerosis of the thoracic aorta, the great vessels of the mediastinum  and the coronary arteries, including calcified atherosclerotic plaque in the left main, left anterior descending, left circumflex and right coronary arteries. No pathologically enlarged mediastinal or hilar lymph nodes. Please note that accurate exclusion of hilar adenopathy is limited on noncontrast CT scans. Esophagus is dilated in contains a large amount of fluid and/or retained solid food. The possibility of esophageal mass is not entirely excluded. No axillary lymphadenopathy. Left-sided internal jugular single-lumen porta cath with tip terminating at the superior cavoatrial junction. Right-sided internal jugular dialysis catheter with tip terminating in the right atrium. Lungs/Pleura: No suspicious appearing pulmonary nodules or masses. No acute consolidative airspace disease. No pleural effusions. Minimal dependent subsegmental atelectasis in the lower lobes of the lungs bilaterally. Musculoskeletal/Soft Tissues: There multiple lytic lesions noted throughout the visualized axial and appendicular skeleton, presumably related to the patient's multiple myeloma. The largest of these is in the T6 vertebral body, where there is what appears to be a chronic compression fracture with approximately 40% loss of anterior and 30% loss of posterior vertebral body height. CT ABDOMEN AND PELVIS FINDINGS Hepatobiliary: Multiple small subcentimeter low-attenuation hepatic lesions are incompletely characterized on today's noncontrast CT examination, but are statistically likely to represent cysts. Unenhanced appearance of the gallbladder is remarkable for some dependent amorphous intermediate attenuation material which likely represents some biliary sludge. No findings to suggest an acute cholecystitis at this time.  Pancreas: No pancreatic mass or peripancreatic inflammatory changes identified on today's noncontrast CT examination. Spleen: Unremarkable. Adrenals/Urinary Tract: 1.2 cm high attenuation lesion in the interpolar  region of the right kidney is incompletely characterize, but likely to represent a proteinaceous/hemorrhagic cyst. Other low-attenuation lesions in the interpolar and lower pole region of the right kidney are also incompletely characterized, but favored to represent cysts. Unenhanced appearance of the left kidney is normal. Bilateral adrenal glands are normal in appearance. No hydroureteronephrosis. Urinary bladder is nearly completely decompressed, but otherwise unremarkable in appearance. Stomach/Bowel: The unenhanced appearance of the stomach is normal. There is no pathologic dilatation of small bowel or colon. Normal appendix. Vascular/Lymphatic: Atherosclerosis throughout the abdominal and pelvic vasculature, without evidence of aneurysm. Circumaortic left renal vein (normal anatomical variant) incidentally noted. No lymphadenopathy noted in the abdomen or pelvis on today's noncontrast CT examination. Reproductive: Prostate gland and seminal vesicles are unremarkable in appearance. Other: No significant volume of ascites.  No pneumoperitoneum. Musculoskeletal: Innumerable lytic lesions are noted throughout the visualized axial and appendicular skeleton, compatible with widespread involvement from patient's known multiple myeloma. The largest of these is in the left side of the sacrum measuring approximately 3.6 x 2.7 cm (image 239 of series 7). IMPRESSION: 1. No definite source for fever of unknown origin identified on today's examination. 2. Widespread osseous involvement from the patient's known multiple myeloma, with what appears to be a chronic compression fracture at T6, as above. 3. Markedly dilated esophagus which contains fluid and debris. The possibility of an underlying esophageal mass in this region is not excluded, and correlation with nonemergent esophagram is suggested in the near future to better evaluate these findings. 4. Atherosclerosis, including left main and 3 vessel coronary artery  disease. Please note that although the presence of coronary artery calcium documents the presence of coronary artery disease, the severity of this disease and any potential stenosis cannot be assessed on this non-gated CT examination. Assessment for potential risk factor modification, dietary therapy or pharmacologic therapy may be warranted, if clinically indicated. 5. Additional incidental findings, as above. Electronically Signed   By: Vinnie Langton M.D.   On: 06/20/2015 08:27   Medications: . pantoprozole (PROTONIX) infusion 8 mg/hr (06/20/15 0030)   . sodium chloride   Intravenous Once  . acetaminophen  650 mg Oral Once  . amoxicillin-clavulanate  1 tablet Oral BID  . calcitRIOL  0.25 mcg Oral Q T,Th,Sa-HD  . darbepoetin (ARANESP) injection - DIALYSIS  100 mcg Intravenous Q Tue-HD  . diphenhydrAMINE  25 mg Oral Once  . feeding supplement  1 Container Oral BID BM  . feeding supplement (ENSURE ENLIVE)  237 mL Oral BID BM  . fluconazole  100 mg Oral Daily  . heparin  5,000 Units Subcutaneous Q8H  . insulin aspart  0-9 Units Subcutaneous Q4H  . metoCLOPramide (REGLAN) injection  5 mg Intravenous Q6H  . metoprolol tartrate  12.5 mg Oral BID  . montelukast  10 mg Oral QHS  . multivitamin  1 tablet Oral QHS  . [START ON 06/23/2015] pantoprazole (PROTONIX) IV  40 mg Intravenous Q12H  . simvastatin  10 mg Oral QHS  . sodium chloride flush  3 mL Intravenous Q12H  . sodium chloride flush  3 mL Intravenous Q12H  . valACYclovir  500 mg Oral Daily

## 2015-06-20 NOTE — Consult Note (Signed)
[facility] Consult: 10:08 AM 06/20/2015  LOS: 4 days    Referring Provider: Dr Sheran Fava  Primary Care Physician:  [name] Primary Gastroenterologist:  Dr. Fuller Plan    Reason for Consultation:  Coffee ground emesis.     HPI: Eddie Thomas is a 70 y.o. male.  Type 2 DM.  Multiple myeloma since 2012, refractory to multiple regimens but latest chemo with Velcade/Daratumumab.  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  DVT and PE in 2016, on Xarelto.  Thrombocytopenia, anemia, pancytopenia. .   04/02/15 esophagram. 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  Differential diagnosis includes benign stricture and Barrett's esophagus, with esophageal carcinoma considered less likely.  Endoscopy is recommended for further evaluation.  05/16/15 EGD for dysphagia and abnormal UGI series. Dilation of benign esoph stricture, candida esophagitis and small HH.  01/2012 EGD for n/v, wt loss. Severe esophagitis. [facility] Colonoscopy. Dr Virgel Bouquet. Colon polyp.   Pt seen by GI on 5/25 as inpt during 5/12 - 5/26 admission for CG emesis.  Had persistent hiccups.  Dr Ardis Hughs increased Protonix to BID and added oral Reglan.  Hgb at discharge 8.3.  Came to ED and readmitted on 5/27 with recurrent fever to 101 post seemingly uneventful HD session.   Weakness, ongoing nausea, dysphagia with small volume CG and more reddish/pink/bloody emesis 10 to 14 x per day, ongoing hiccups since discharge.  Has discomfort in region of mid esophagus, not worse with swallowing.     Non contrast CT chest/abd/pelvis:  Dilated and debris filled esophagus, can not rule out underlying mass.  Widespread osseous MM.  Multiple small liver  lesions are likely benign cysts.  Likely GB sludge.  Labs confirm pancytopenia.  He has received PRBC x 1, Hgb 8.9, up from 7.2.   Empiric diflucan restarted today.  Oral PPI changed to Protonix drip today due to the bloody emesis.  Xarelto is on hold. No doses since admission.     Past Medical History  Diagnosis Date  . Diabetes mellitus ([place]) 07/03/2011  . Hypertension 07/03/2011  . Hyperlipidemia 07/03/2011  . Colon polyps 2012  . Gastric ulcer   . Bulging lumbar disc   . Encounter for antineoplastic chemotherapy 03/05/2015  . Anemia   . Blood transfusion without reported diagnosis 2015  . Multiple myeloma 2011    currently undergoing chemo  . Cataract     Past Surgical History  Procedure Laterality Date  . Limbal stem cell transplant    . Humerus fracture surgery  2011    right  . 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  . Cataract extraction w/ intraocular lens implant Bilateral left 3/17, right 4/17  . Av fistula placement Left 06/08/2015    Procedure: LEFT UPPER ARM ARTERIOVENOUS (AV) FISTULA CREATION;  Surgeon: Mal Misty, MD;  Location: [place];  Service: Vascular;  Laterality: Left;    Prior to Admission medications  Medication Sig Start Date End Date Taking? Authorizing Provider  calcitRIOL (ROCALTROL) 0.25 MCG capsule Take 1 capsule (0.25 mcg total) by mouth Every Tuesday,Thursday,and Saturday with dialysis. 06/15/15  Yes Orson Eva, MD  chlorproMAZINE (THORAZINE) 25 MG tablet Take 1 tablet (25 mg total) by mouth 3 (three) times daily as needed for hiccoughs. 06/15/15  Yes Orson Eva, MD  feeding supplement, ENSURE ENLIVE, (ENSURE ENLIVE) LIQD Take 237 mLs by mouth 2 (two) times daily between meals. 06/15/15  Yes Orson Eva, MD  glipiZIDE (GLUCOTROL) 5 MG tablet Take 0.5 tablets (2.5 mg total) by mouth daily before breakfast. 06/15/15  Yes Orson Eva, MD  lidocaine-prilocaine (EMLA) cream Apply 1 application topically as needed. Apply one hour before use  and cover with plastic wrap. 10/17/14  Yes Adrena E Johnson, PA-C  metoCLOPramide (REGLAN) 5 MG tablet Take 1 tablet (5 mg total) by mouth 3 (three) times daily before meals. 06/15/15  Yes Orson Eva, MD  metoprolol tartrate (LOPRESSOR) 25 MG tablet Take 0.5 tablets (12.5 mg total) by mouth 2 (two) times daily. 06/15/15  Yes Orson Eva, MD  montelukast (SINGULAIR) 10 MG tablet Take 10 mg by mouth at bedtime.  04/18/15 04/17/16 Yes Historical Provider, MD  multivitamin (RENA-VIT) TABS tablet Take 1 tablet by mouth at bedtime. 06/15/15  Yes Orson Eva, MD  pantoprazole (PROTONIX) 40 MG tablet TAKE ONE TABLET BY MOUTH TWICE DAILY 02/02/15  Yes Ladene Artist, MD  prochlorperazine (COMPAZINE) 10 MG tablet Take 10 mg by mouth every 6 (six) hours as needed for nausea or vomiting. Reported on 05/14/2015   Yes Historical Provider, MD  sevelamer carbonate (RENVELA) 800 MG tablet Take 1 tablet (800 mg total) by mouth 3 (three) times daily with meals. 06/15/15  Yes Orson Eva, MD  simvastatin (ZOCOR) 10 MG tablet Take 10 mg by mouth at bedtime.     Yes Historical Provider, MD  valACYclovir (VALTREX) 500 MG tablet TAKE ONE CAPLET BY MOUTH ONCE DAILY 01/24/15  Yes Curt Bears, MD  VIAGRA 50 MG tablet Take 50 mg by mouth daily as needed for erectile dysfunction.  02/23/13  Yes Historical Provider, MD  vitamin B-12 500 MCG tablet Take 1 tablet (500 mcg total) by mouth daily. 06/15/15  Yes Orson Eva, MD    Scheduled Meds: . sodium chloride   Intravenous Once  . acetaminophen  650 mg Oral Once  . amoxicillin-clavulanate  1 tablet Oral BID  . calcitRIOL  0.25 mcg Oral Q T,Th,Sa-HD  . darbepoetin (ARANESP) injection - DIALYSIS  100 mcg Intravenous Q Tue-HD  . diphenhydrAMINE  25 mg Oral Once  . feeding supplement  1 Container Oral BID BM  . feeding supplement (ENSURE ENLIVE)  237 mL Oral BID BM  . fluconazole  100 mg Oral Daily  . heparin  5,000 Units Subcutaneous Q8H  . insulin aspart  0-9 Units Subcutaneous Q4H  .  metoCLOPramide (REGLAN) injection  5 mg Intravenous Q6H  . metoprolol tartrate  12.5 mg Oral BID  . montelukast  10 mg Oral QHS  . multivitamin  1 tablet Oral QHS  . [START ON 06/23/2015] pantoprazole (PROTONIX) IV  40 mg Intravenous Q12H  . simvastatin  10 mg Oral QHS  . sodium chloride flush  3 mL Intravenous Q12H  . sodium chloride flush  3 mL Intravenous Q12H  . valACYclovir  500 mg Oral Daily   Infusions: . pantoprozole (PROTONIX) infusion 8 mg/hr (06/20/15 0030)   PRN Meds: sodium chloride, acetaminophen **OR** acetaminophen, chlorproMAZINE, HYDROmorphone (  DILAUDID) injection, metoCLOPramide (REGLAN) injection, ondansetron **OR** ondansetron (ZOFRAN) IV, oxyCODONE, sodium chloride flush, sodium chloride flush, zolpidem   Allergies as of 06/16/2015  . (No Known Allergies)    Family History  Problem Relation Age of Onset  . Diabetes Mother   . Hyperlipidemia Mother   . Diabetes Sister   . Diabetes Brother   . Colon cancer Maternal Uncle 43    Social History   Social History  . Marital Status: Married    Spouse Name: N/A  . Number of Children: 2  . Years of Education: N/A   Occupational History  . retired    Social History Main Topics  . Smoking status: Never Smoker   . Smokeless tobacco: Never Used  . Alcohol Use: No  . Drug Use: No  . Sexual Activity: Not on file   Other Topics Concern  . Not on file   Social History Narrative    REVIEW OF SYSTEMS: Constitutional:  26 # weight loss in less than 1 month.  + weakness, no falls.  ENT:  No nose bleeds Pulm:  No dyspnea.  Chest pain per HPI CV:  No palpitations, no LE edema.  GU:  No hematuria, no frequency GI:  Per HPI Heme:  Per HPI   Transfusions:  Per HPI Neuro:  No headaches, no peripheral tingling or numbness MS:  No bone pain, no joint aches.  Derm:  No itching, no rash or sores.  Endocrine:  No sweats or chills.  No polyuria or dysuria Immunization:  Not queried.  Travel:  None beyond local  counties in last few months.    PHYSICAL EXAM: Vital signs in last 24 hours: Filed Vitals:   06/20/15 0417 06/20/15 0827  BP: 141/70 125/66  Pulse: 104 110  Temp: 99.3 F (37.4 C) 99.4 F (37.4 C)  Resp: 18 17   Wt Readings from Last 3 Encounters:  06/19/15 61.7 kg (136 lb 0.4 oz)  06/14/15 62.732 kg (138 lb 4.8 oz)  05/16/15 73.483 kg (162 lb)    General: pleasant, thin, alert AAM with hiccups Head:  No swelling or asymmetry  Eyes:  No icterus or pallor.   Ears:  Not HOH.  Nose:  No discharge.   Mouth:  Moist, clear oral MM.   Neck:  No mass, no JVD Lungs:  Clear bil.  No cough or dyspnea. Dialysis catheter on right upper chest Heart: irreg, mildly tachy.  Abdomen:  Soft, NT, ND.  BS hypoactive.   Rectal: deferred   Musc/Skeltl: no joint swelling or contractures Extremities:  No CCE.    Neurologic:  Oriented x 3.  Fully alert. Moves all 4s.  No tremor, no limb weakness.  Skin:  No sores, rash or lesions Psych:  Flat affect, pleasant, calm and cooperative but seems depressed.   Intake/Output from previous day: 05/30 0701 - 05/31 0700 In: 815 [P.O.:480; Blood:335] Out: 2635  Intake/Output this shift:    LAB RESULTS:  Recent Labs  06/19/15 0412 06/20/15 0430  WBC 3.5* 3.7*  HGB 7.2* 8.9*  HCT 22.9* 28.3*  PLT 73* 69*   BMET Lab Results  Component Value Date   NA 135 06/19/2015   NA 134* 06/17/2015   NA 133* 06/16/2015   K 3.8 06/19/2015   K 4.7 06/17/2015   K 4.5 06/16/2015   CL 96* 06/19/2015   CL 97* 06/17/2015   CL 95* 06/16/2015   CO2 26 06/19/2015   CO2 26 06/17/2015   CO2 24 06/16/2015  GLUCOSE 120* 06/19/2015   GLUCOSE 141* 06/17/2015   GLUCOSE 179* 06/16/2015   BUN 13 06/19/2015   BUN 20 06/17/2015   BUN 15 06/16/2015   CREATININE 3.70* 06/19/2015   CREATININE 4.69* 06/17/2015   CREATININE 3.77* 06/16/2015   CALCIUM 8.0* 06/19/2015   CALCIUM 7.8* 06/17/2015   CALCIUM 8.1* 06/16/2015   LFT  Recent Labs  06/19/15 1006    ALBUMIN 2.2*   PT/INR Lab Results  Component Value Date   INR 1.44 06/15/2015   INR 1.47 06/14/2015   INR 1.77* 06/13/2015   Hepatitis Panel No results for input(s): HEPBSAG, HCVAB, HEPAIGM, HEPBIGM in the last 72 hours. C-Diff No components found for: CDIFF Lipase     Component Value Date/Time   LIPASE 38 12/31/2011 1420    Drugs of Abuse  No results found for: LABOPIA, COCAINSCRNUR, LABBENZ, AMPHETMU, THCU, LABBARB   RADIOLOGY STUDIES: Ct Abdomen Pelvis Wo Contrast Ct Chest Wo Contrast 06/20/2015  CLINICAL DATA:  70 year old male with history of fever of unknown origin. EXAM: CT CHEST, ABDOMEN AND PELVIS WITHOUT CONTRAST TECHNIQUE: Multidetector CT imaging of the chest, abdomen and pelvis was performed following the standard protocol without IV contrast. COMPARISON:  No priors. FINDINGS: CT CHEST FINDINGS Mediastinum/Lymph Nodes: Heart size is normal. There is no significant pericardial fluid, thickening or pericardial calcification. There is atherosclerosis of the thoracic aorta, the great vessels of the mediastinum and the coronary arteries, including calcified atherosclerotic plaque in the left main, left anterior descending, left circumflex and right coronary arteries. No pathologically enlarged mediastinal or hilar lymph nodes. Please note that accurate exclusion of hilar adenopathy is limited on noncontrast CT scans. Esophagus is dilated in contains a large amount of fluid and/or retained solid food. The possibility of esophageal mass is not entirely excluded. No axillary lymphadenopathy. Left-sided internal jugular single-lumen porta cath with tip terminating at the superior cavoatrial junction. Right-sided internal jugular dialysis catheter with tip terminating in the right atrium. Lungs/Pleura: No suspicious appearing pulmonary nodules or masses. No acute consolidative airspace disease. No pleural effusions. Minimal dependent subsegmental atelectasis in the lower lobes of the  lungs bilaterally. Musculoskeletal/Soft Tissues: There multiple lytic lesions noted throughout the visualized axial and appendicular skeleton, presumably related to the patient's multiple myeloma. The largest of these is in the T6 vertebral body, where there is what appears to be a chronic compression fracture with approximately 40% loss of anterior and 30% loss of posterior vertebral body height. CT ABDOMEN AND PELVIS FINDINGS Hepatobiliary: Multiple small subcentimeter low-attenuation hepatic lesions are incompletely characterized on today's noncontrast CT examination, but are statistically likely to represent cysts. Unenhanced appearance of the gallbladder is remarkable for some dependent amorphous intermediate attenuation material which likely represents some biliary sludge. No findings to suggest an acute cholecystitis at this time. Pancreas: No pancreatic mass or peripancreatic inflammatory changes identified on today's noncontrast CT examination. Spleen: Unremarkable. Adrenals/Urinary Tract: 1.2 cm high attenuation lesion in the interpolar region of the right kidney is incompletely characterize, but likely to represent a proteinaceous/hemorrhagic cyst. Other low-attenuation lesions in the interpolar and lower pole region of the right kidney are also incompletely characterized, but favored to represent cysts. Unenhanced appearance of the left kidney is normal. Bilateral adrenal glands are normal in appearance. No hydroureteronephrosis. Urinary bladder is nearly completely decompressed, but otherwise unremarkable in appearance. Stomach/Bowel: The unenhanced appearance of the stomach is normal. There is no pathologic dilatation of small bowel or colon. Normal appendix. Vascular/Lymphatic: Atherosclerosis throughout the abdominal and pelvic vasculature, without  evidence of aneurysm. Circumaortic left renal vein (normal anatomical variant) incidentally noted. No lymphadenopathy noted in the abdomen or pelvis on  today's noncontrast CT examination. Reproductive: Prostate gland and seminal vesicles are unremarkable in appearance. Other: No significant volume of ascites.  No pneumoperitoneum. Musculoskeletal: Innumerable lytic lesions are noted throughout the visualized axial and appendicular skeleton, compatible with widespread involvement from patient's known multiple myeloma. The largest of these is in the left side of the sacrum measuring approximately 3.6 x 2.7 cm (image 239 of series 7). IMPRESSION: 1. No definite source for fever of unknown origin identified on today's examination. 2. Widespread osseous involvement from the patient's known multiple myeloma, with what appears to be a chronic compression fracture at T6, as above. 3. Markedly dilated esophagus which contains fluid and debris. The possibility of an underlying esophageal mass in this region is not excluded, and correlation with nonemergent esophagram is suggested in the near future to better evaluate these findings. 4. Atherosclerosis, including left main and 3 vessel coronary artery disease. Please note that although the presence of coronary artery calcium documents the presence of coronary artery disease, the severity of this disease and any potential stenosis cannot be assessed on this non-gated CT examination. Assessment for potential risk factor modification, dietary therapy or pharmacologic therapy may be warranted, if clinically indicated. 5. Additional incidental findings, as above. Electronically Signed   By: Vinnie Langton M.D.   On: 06/20/2015 08:27   Dg Chest 2 View  06/18/2015  CLINICAL DATA:  Dyspnea. Chest discomfort 3 hours. Hypertension and diabetes. Dialysis patient. EXAM: CHEST  2 VIEW COMPARISON:  06/16/2015. FINDINGS: Right jugular dual-lumen dialysis catheter in the lower SVC. Port-A-Cath tip in the SVC. These are unchanged. Negative for pulmonary edema. No infiltrate or effusion. Lungs are clear. IMPRESSION: No active  cardiopulmonary disease. Electronically Signed   By: Franchot Gallo M.D.   On: 06/18/2015 18:16   ENDOSCOPIC STUDIES: Per HPI  IMPRESSION:   * CG and bloody emesis.   05/16/15 EGD for dysphagia: candida esophagitis, dilation of esophageal stricture, small HH. Completed course of Diflucan. Protonix increased to BID and Reglan added 6 days ago.    Swallowing improved but persistent/intractable hiccups. CT scan shows dilated esophagus full of food/liquid.    * Hiccups. Meds tried in past include: ativan, baclofen, thorazine. All discontinued for different reasons.   * ESRD and initiated on HD in last 2 weeks. AKI felt due to MM or drug induced nephropathy.   * Anemia. Due to MM and kidney dx.  Thrombocytopenia. Pancytopenia. Initiated on epo and transfused PRBC x 2 during admission ending last week.  Now s/p 1 PRBC on 5/30.     *  Hx DVT and PE.  Chronic Xarelto on hold.  Last dose ~ 5/27. Receiving TID SQ Heparin.     * Multiple myeloma. Not clear from notes if disease is progressive but hx of being refractory to chemo regimens. Last chemo on 06/01/15.  Widespread bony involvement per CT scan.     PLAN:     *  EGD.  Tomorrow 0900.  Hold Heparin after midnight *  Since bloody emesis is likely due to esophagitis and not hemorrhage from an ulcer, does not need PPI drip.  Will change to BID IV PPI.  Leave empiric Diflucan in place.     Azucena Freed  06/20/2015, 10:08 AM Pager: [phone]     Attending physician's note   I have taken a history, examined the patient and reviewed  the chart. I agree with the Advanced Practitioner's note, impression and recommendations. Coffee ground/pink emesis, hiccups, nausea and a dilated, fluid filled esophagus on CT. R/O a persistent or worsening esophageal stricture, esophagitis, achalasia or other motility disorder. Continue to hold Xarelto, IV PPI bid, IV Reglan, IV antiemetics for now. EGD with possible dilation tomorrow.   [name], MD [facility] [phone] Mon-Fri 8a-5p [phone] after 5p, weekends, holidays

## 2015-06-20 NOTE — Progress Notes (Addendum)
PROGRESS NOTE  Eddie Thomas  XYI:[id number] DOB: [birth year] DOA: 06/16/2015 PCP: [name]  Brief Narrative:  70 y.o. male with medical history significant of multiple myeloma, IgA subtype, currently undergoing treatment with Velcade/Daratumumab at the cancer center, follows Dr. Julien Nordmann of medical oncology, also having a history of chronic kidney disease (with baseline creatinine near 1.6-2.0), hypertension, diabetes mellitus, presented as a transfer from the cancer center for acute renal failure with creatinine of 10 on 06/01/15. Nephrology was consulted, and the patient was initiated on dialysis during his last hospitalization. A tunnel dialysis catheter and AVF was placed by [facility]. He was discharged in stable condition on 06/15/15. Unfortunately, the patient developed a fever up to 101.48F with associated nausea and vomiting after his dialysis session on 06/15/2015. After admission, the patient has had a temperature of 100.35F with tachycardia and soft blood pressures. Nephrology has been consulted to assist with management.  Summary of previous hospitalization--Intractable hiccups- unclear etiology. ABLA complicating chronic anemia > multiple antibodies on type and screen- plan for 2 PRBC's on 5/23. Acute AMS developed on 06/12/15 which was likely multifactorial including medications and possible new infection as pt had a fever up to 101.6. Since then, his mental status has improved and returned back to baseline. On 06/14/15, pt c/o of cp and sob. With his hx of DVT, V/QScan was obtained and it was negative. In addition, the patient had coffee grounds emesis during hospitalization. He was evaluated by [facility]. They did not feel the patient needed an additional EGD at this time as he has had recent EGD on 05/16/2015. His PPI was increased to twice a day, and Reglan was added.  Started vomiting maroon colored blood on 5/30 with inability to tolerate PO.  Assessment & Plan:     Principal Problem:   SIRS (systemic inflammatory response syndrome) (HCC) Active Problems:   Multiple myeloma (HCC)   Diabetes mellitus (HCC)   Antineoplastic chemotherapy induced anemia   Hypoalbuminemia due to protein-calorie malnutrition (HCC)   Generalized weakness   ESRD on hemodialysis (HCC)   Nausea and vomiting   Aspiration pneumonitis (HCC)   Pyrexia   Protein-calorie malnutrition, severe   FUO (fever of unknown origin)  SIRS/Fever -presents with fever and tachycardia -Suspect aspiration pneumonitis given the patient's recurrent hiccups and vomiting vs neoplastic fever from myeloma (refractory to multiple chemo regimens and SCT in 2012) -06/16/15-blood culture remains neg - change augmentin to unasyn as likely not absorbing pill - CT chest/abdomen/pelvis demonstrated bilateral lower lobe "atelectasis" but this could be mild aspiration from overfull esophagus -ESR >140 (likely due to MM) -06/07/15 echo--EF 55-65%, grade 2 DD, mod TR -Lactic acid 0.99 -Procalcitonin 1.63  Acute renal failure complicating chronic kidney disease-->ESRD - Nephrology was consulted. As per nephrology, creatinine started to rise in March-April 2017 up to 1.4-1.8 range with some bumps up to 2.3-2.5. On May 8, creatinine was 3.3.  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 Patient's myeloma is refractory to multiple different chemotherapy combinations and do not believe that his chemotherapy or pheresis is going to help his renal function. - Nephrology suggested palliative care consultation, to be pursued by oncology as outpatient. - TDC and AVF placed by [facility]. HD initiated by nephrology.  -  Outpatient TTS dialysis from [facility]  Acute encephalopathy -developed between 5/22 night and 5/23 AM -likely multifactorial including infection, meds, worsen anemia -new fever on 06/12/15 of 101.6--blood cultures negative at the time of discharge -06/12/15 CT brain negative -06/13/15--mental status  improving -Discontinued  Thorazine, baclofen, opioids, gabapentin, other hypnotics -check ammonia--24  -B12--266 -TSH--0.493, Free T4--1.04 -with B12 in low normal range-->started supplementation -Mental status presently at baseline  Intractable hiccups, intermittent vomiting and coffee grounds emesis due to probable recurrent low esophageal stricture with retained esophageal debris/fluid -[facility] reconsulted > EGD on 6/1  Atypical Chest Pain, likely due to esophageal stricture and dilation -developed 06/14/15 -with hx of DVT left leg, myeloma and new tachycardia-->VQ scan neg -EKG-no concerning ischemic changes -cycle troponins--unremarkable; minimal elevation likely due to ESRD  Multiple Myeloma: - Follows with Dr Julien Nordmann.  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 - Transfused 2 units of PRBC 5/23 and follow posttransfusion hemoglobin. Blood bank called and stated patient has multiple antibodies related to his myeloma medications but director of blood bank has cleared the available blood for transfusion. Verified same with oncologist who has had similar experience in the past. -total 5 units received during previous admission -  Hemoglobin responded to 2 units PRBC on 5/30  Pancytopenia - Secondary to multiple myeloma and related cancer chemotherapy. - Anemia management as above. Thrombocytopenia stable.  NSVT: - Patient had 15 beats of nonsustained V. tach, hypomagnesemia corrected, 2-D echo done 11/2015 with EF 55-60% - Continue with telemetry monitor - May be related to renal failure, electrolyte abnormalities. Repeat 2-D echo: Normal EF. -change to IV metoprolol   History of DVT - Xarelto DC'ed given renal function (took Xarelto approximately one year) - Discussed with Dr. Julien Nordmann on 5/17 and he had recommended continued anticoagulation. Attempted IV  heparin bridging and Coumadin but each time IV heparin was initiated, patient had bleeding from [facility] site and hence all anticoagulation was stopped. Not good anticoagulation candidate due to bleeding complications. Discussed with Dr. Julien Nordmann on 5/21.  Essential hypertension - ACEI stopped due to acute renal failure.  -Blood pressure was controlled, but gradually increased during hospitalization -change to IV metoprolol for now pending dilation of stricture  Diabetes mellitus type 2 -CBGs remained largely controlled -Discontinue Actos -was sent home with low-dose glipizide last admit -novolog sliding scale for now  Severe protein calorie malnutrition -  NPO for now, but will start supplements and liberalized diet post procedure  DVT prophylaxis:  heparin Code Status:  DNR Family Communication:  Patient alone Disposition Plan:  Pending esophageal dilation   Consultants:   GI  Nephrology  Vascular surgery  Procedures:  none  Antimicrobials:   Zosyn   Augmentin  Unasyn    Subjective: Patient states he feels okay except for vomiting blood and he showed me the container with maroon blood from this morning.  Denies blood in stools.  Has some chest pressure or fullness that has been worse for the last two days.    Objective: Filed Vitals:   06/20/15 0417 06/20/15 0827 06/20/15 1148 06/20/15 1702  BP: 141/70 125/66 146/68 123/64  Pulse: 104 110 104 104  Temp: 99.3 F (37.4 C) 99.4 F (37.4 C) 99.1 F (37.3 C) 98.8 F (37.1 C)  TempSrc: Oral Oral Oral Oral  Resp: 18 17 16 17   Height:      Weight:      SpO2: 98% 100% 100% 100%    Intake/Output Summary (Last 24 hours) at 06/20/15 1720 Last data filed at 06/20/15 1415  Gross per 24 hour  Intake    120 ml  Output      0 ml  Net    120 ml   Filed Weights   06/19/15 0003 06/19/15 0758 06/19/15 1245  Weight: 65.5 kg (144 lb 6.4 oz) 63.7 kg (140 lb 6.9 oz) 61.7 kg (136 lb 0.4 oz)    Examination:  General  exam:  Cachectic adult male.  No acute distress.  HEENT:  NCAT, MMM Respiratory system:  Rales at the bilateral bases Cardiovascular system: Tachycardic, regular rhythm, normal S1/S2. No murmurs, rubs, gallops or clicks.  Warm extremities Gastrointestinal system: Normal active bowel sounds, soft, nondistended, nontender. MSK:  Normal tone and bulk, no lower extremity edema Neuro:  Grossly intact    Data Reviewed: I have personally reviewed following labs and imaging studies  CBC:  Recent Labs Lab 06/15/15 0520 06/16/15 1934 06/17/15 0245 06/19/15 0412 06/20/15 0430  WBC 4.3 4.5 4.2 3.5* 3.7*  NEUTROABS  --   --   --  3.0  --   HGB 8.3* 8.6* 7.9* 7.2* 8.9*  HCT 26.4* 27.1* 25.1* 22.9* 28.3*  MCV 82.0 83.1 83.7 82.1 80.9  PLT 82* 89* 72* 73* 69*   Basic Metabolic Panel:  Recent Labs Lab 06/14/15 0649 06/16/15 1934 06/17/15 0245 06/19/15 1006  NA 137 133* 134* 135  K 3.8 4.5 4.7 3.8  CL 97* 95* 97* 96*  CO2 23 24 26 26   GLUCOSE 181* 179* 141* 120*  BUN 57* 15 20 13   CREATININE 8.06* 3.77* 4.69* 3.70*  CALCIUM 8.7* 8.1* 7.8* 8.0*  PHOS 6.3*  --   --  1.7*   GFR: Estimated Creatinine Clearance: 16.4 mL/min (by C-G formula based on Cr of 3.7). Liver Function Tests:  Recent Labs Lab 06/14/15 0649 06/19/15 1006  ALBUMIN 2.5* 2.2*   No results for input(s): LIPASE, AMYLASE in the last 168 hours. No results for input(s): AMMONIA in the last 168 hours. Coagulation Profile:  Recent Labs Lab 06/14/15 0442 06/15/15 0520  INR 1.47 1.44   Cardiac Enzymes:  Recent Labs Lab 06/15/15 0520 06/15/15 1026 06/18/15 1840 06/18/15 2315 06/19/15 0412  TROPONINI 0.04* 0.03 0.04* 0.03 0.05*   BNP (last 3 results) No results for input(s): PROBNP in the last 8760 hours. HbA1C: No results for input(s): HGBA1C in the last 72 hours. CBG:  Recent Labs Lab 06/19/15 2358 06/20/15 0405 06/20/15 0825 06/20/15 1147 06/20/15 1700  GLUCAP 141* 150* 153* 151* 147*    Lipid Profile: No results for input(s): CHOL, HDL, LDLCALC, TRIG, CHOLHDL, LDLDIRECT in the last 72 hours. Thyroid Function Tests: No results for input(s): TSH, T4TOTAL, FREET4, T3FREE, THYROIDAB in the last 72 hours. Anemia Panel: No results for input(s): VITAMINB12, FOLATE, FERRITIN, TIBC, IRON, RETICCTPCT in the last 72 hours. Urine analysis:    Component Value Date/Time   COLORURINE YELLOW 06/01/2015 [place] 06/01/2015 1540   LABSPEC 1.008 06/01/2015 1540   PHURINE 6.0 06/01/2015 1540   GLUCOSEU NEGATIVE 06/01/2015 1540   HGBUR TRACE* 06/01/2015 1540   BILIRUBINUR NEGATIVE 06/01/2015 1540   KETONESUR NEGATIVE 06/01/2015 1540   PROTEINUR 30* 06/01/2015 1540   UROBILINOGEN 1.0 01/16/2012 1902   NITRITE NEGATIVE 06/01/2015 1540   LEUKOCYTESUR NEGATIVE 06/01/2015 1540   Sepsis Labs: @LABRCNTIP (procalcitonin:4,lacticidven:4)  ) Recent Results (from the past 240 hour(s))  Culture, blood (Routine X 2) w Reflex to ID Panel     Status: None   Collection Time: 06/12/15  8:58 PM  Result Value Ref Range Status   Specimen Description RIGHT ANTECUBITAL  Final   Special Requests IN BOTH AEROBIC AND ANAEROBIC BOTTLES  5CC  Final   Culture NO GROWTH 5 DAYS  Final   Report Status 06/17/2015 FINAL  Final  Culture, blood (Routine X 2) w Reflex to ID Panel     Status: None   Collection Time: 06/12/15  9:07 PM  Result Value Ref Range Status   Specimen Description RIGHT ANTECUBITAL  Final   Special Requests IN BOTH AEROBIC AND ANAEROBIC BOTTLES 5CC  Final   Culture NO GROWTH 5 DAYS  Final   Report Status 06/17/2015 FINAL  Final  Blood culture (routine x 2)     Status: None (Preliminary result)   Collection Time: 06/16/15 10:11 PM  Result Value Ref Range Status   Specimen Description BLOOD RIGHT ANTECUBITAL  Final   Special Requests BOTTLES DRAWN AEROBIC AND ANAEROBIC 5CC   Final   Culture NO GROWTH 4 DAYS  Final   Report Status PENDING  Incomplete  Blood culture  (routine x 2)     Status: None (Preliminary result)   Collection Time: 06/16/15 10:30 PM  Result Value Ref Range Status   Specimen Description BLOOD RIGHT HAND  Final   Special Requests IN PEDIATRIC BOTTLE 1CC  Final   Culture NO GROWTH 4 DAYS  Final   Report Status PENDING  Incomplete      Radiology Studies: Ct Abdomen Pelvis Wo Contrast  06/20/2015  CLINICAL DATA:  70 year old male with history of fever of unknown origin. EXAM: CT CHEST, ABDOMEN AND PELVIS WITHOUT CONTRAST TECHNIQUE: Multidetector CT imaging of the chest, abdomen and pelvis was performed following the standard protocol without IV contrast. COMPARISON:  No priors. FINDINGS: CT CHEST FINDINGS Mediastinum/Lymph Nodes: Heart size is normal. There is no significant pericardial fluid, thickening or pericardial calcification. There is atherosclerosis of the thoracic aorta, the great vessels of the mediastinum and the coronary arteries, including calcified atherosclerotic plaque in the left main, left anterior descending, left circumflex and right coronary arteries. No pathologically enlarged mediastinal or hilar lymph nodes. Please note that accurate exclusion of hilar adenopathy is limited on noncontrast CT scans. Esophagus is dilated in contains a large amount of fluid and/or retained solid food. The possibility of esophageal mass is not entirely excluded. No axillary lymphadenopathy. Left-sided internal jugular single-lumen porta cath with tip terminating at the superior cavoatrial junction. Right-sided internal jugular dialysis catheter with tip terminating in the right atrium. Lungs/Pleura: No suspicious appearing pulmonary nodules or masses. No acute consolidative airspace disease. No pleural effusions. Minimal dependent subsegmental atelectasis in the lower lobes of the lungs bilaterally. Musculoskeletal/Soft Tissues: There multiple lytic lesions noted throughout the visualized axial and appendicular skeleton, presumably related to the  patient's multiple myeloma. The largest of these is in the T6 vertebral body, where there is what appears to be a chronic compression fracture with approximately 40% loss of anterior and 30% loss of posterior vertebral body height. CT ABDOMEN AND PELVIS FINDINGS Hepatobiliary: Multiple small subcentimeter low-attenuation hepatic lesions are incompletely characterized on today's noncontrast CT examination, but are statistically likely to represent cysts. Unenhanced appearance of the gallbladder is remarkable for some dependent amorphous intermediate attenuation material which likely represents some biliary sludge. No findings to suggest an acute cholecystitis at this time. Pancreas: No pancreatic mass or peripancreatic inflammatory changes identified on today's noncontrast CT examination. Spleen: Unremarkable. Adrenals/Urinary Tract: 1.2 cm high attenuation lesion in the interpolar region of the right kidney is incompletely characterize, but likely to represent a proteinaceous/hemorrhagic cyst. Other low-attenuation lesions in the interpolar and lower pole region of the right kidney  are also incompletely characterized, but favored to represent cysts. Unenhanced appearance of the left kidney is normal. Bilateral adrenal glands are normal in appearance. No hydroureteronephrosis. Urinary bladder is nearly completely decompressed, but otherwise unremarkable in appearance. Stomach/Bowel: The unenhanced appearance of the stomach is normal. There is no pathologic dilatation of small bowel or colon. Normal appendix. Vascular/Lymphatic: Atherosclerosis throughout the abdominal and pelvic vasculature, without evidence of aneurysm. Circumaortic left renal vein (normal anatomical variant) incidentally noted. No lymphadenopathy noted in the abdomen or pelvis on today's noncontrast CT examination. Reproductive: Prostate gland and seminal vesicles are unremarkable in appearance. Other: No significant volume of ascites.  No  pneumoperitoneum. Musculoskeletal: Innumerable lytic lesions are noted throughout the visualized axial and appendicular skeleton, compatible with widespread involvement from patient's known multiple myeloma. The largest of these is in the left side of the sacrum measuring approximately 3.6 x 2.7 cm (image 239 of series 7). IMPRESSION: 1. No definite source for fever of unknown origin identified on today's examination. 2. Widespread osseous involvement from the patient's known multiple myeloma, with what appears to be a chronic compression fracture at T6, as above. 3. Markedly dilated esophagus which contains fluid and debris. The possibility of an underlying esophageal mass in this region is not excluded, and correlation with nonemergent esophagram is suggested in the near future to better evaluate these findings. 4. Atherosclerosis, including left main and 3 vessel coronary artery disease. Please note that although the presence of coronary artery calcium documents the presence of coronary artery disease, the severity of this disease and any potential stenosis cannot be assessed on this non-gated CT examination. Assessment for potential risk factor modification, dietary therapy or pharmacologic therapy may be warranted, if clinically indicated. 5. Additional incidental findings, as above. Electronically Signed   By: Vinnie Langton M.D.   On: 06/20/2015 08:27   Dg Chest 2 View  06/18/2015  CLINICAL DATA:  Dyspnea. Chest discomfort 3 hours. Hypertension and diabetes. Dialysis patient. EXAM: CHEST  2 VIEW COMPARISON:  06/16/2015. FINDINGS: Right jugular dual-lumen dialysis catheter in the lower SVC. Port-A-Cath tip in the SVC. These are unchanged. Negative for pulmonary edema. No infiltrate or effusion. Lungs are clear. IMPRESSION: No active cardiopulmonary disease. Electronically Signed   By: Franchot Gallo M.D.   On: 06/18/2015 18:16   Ct Chest Wo Contrast  06/20/2015  CLINICAL DATA:  70 year old male with  history of fever of unknown origin. EXAM: CT CHEST, ABDOMEN AND PELVIS WITHOUT CONTRAST TECHNIQUE: Multidetector CT imaging of the chest, abdomen and pelvis was performed following the standard protocol without IV contrast. COMPARISON:  No priors. FINDINGS: CT CHEST FINDINGS Mediastinum/Lymph Nodes: Heart size is normal. There is no significant pericardial fluid, thickening or pericardial calcification. There is atherosclerosis of the thoracic aorta, the great vessels of the mediastinum and the coronary arteries, including calcified atherosclerotic plaque in the left main, left anterior descending, left circumflex and right coronary arteries. No pathologically enlarged mediastinal or hilar lymph nodes. Please note that accurate exclusion of hilar adenopathy is limited on noncontrast CT scans. Esophagus is dilated in contains a large amount of fluid and/or retained solid food. The possibility of esophageal mass is not entirely excluded. No axillary lymphadenopathy. Left-sided internal jugular single-lumen porta cath with tip terminating at the superior cavoatrial junction. Right-sided internal jugular dialysis catheter with tip terminating in the right atrium. Lungs/Pleura: No suspicious appearing pulmonary nodules or masses. No acute consolidative airspace disease. No pleural effusions. Minimal dependent subsegmental atelectasis in the lower lobes of the lungs  bilaterally. Musculoskeletal/Soft Tissues: There multiple lytic lesions noted throughout the visualized axial and appendicular skeleton, presumably related to the patient's multiple myeloma. The largest of these is in the T6 vertebral body, where there is what appears to be a chronic compression fracture with approximately 40% loss of anterior and 30% loss of posterior vertebral body height. CT ABDOMEN AND PELVIS FINDINGS Hepatobiliary: Multiple small subcentimeter low-attenuation hepatic lesions are incompletely characterized on today's noncontrast CT  examination, but are statistically likely to represent cysts. Unenhanced appearance of the gallbladder is remarkable for some dependent amorphous intermediate attenuation material which likely represents some biliary sludge. No findings to suggest an acute cholecystitis at this time. Pancreas: No pancreatic mass or peripancreatic inflammatory changes identified on today's noncontrast CT examination. Spleen: Unremarkable. Adrenals/Urinary Tract: 1.2 cm high attenuation lesion in the interpolar region of the right kidney is incompletely characterize, but likely to represent a proteinaceous/hemorrhagic cyst. Other low-attenuation lesions in the interpolar and lower pole region of the right kidney are also incompletely characterized, but favored to represent cysts. Unenhanced appearance of the left kidney is normal. Bilateral adrenal glands are normal in appearance. No hydroureteronephrosis. Urinary bladder is nearly completely decompressed, but otherwise unremarkable in appearance. Stomach/Bowel: The unenhanced appearance of the stomach is normal. There is no pathologic dilatation of small bowel or colon. Normal appendix. Vascular/Lymphatic: Atherosclerosis throughout the abdominal and pelvic vasculature, without evidence of aneurysm. Circumaortic left renal vein (normal anatomical variant) incidentally noted. No lymphadenopathy noted in the abdomen or pelvis on today's noncontrast CT examination. Reproductive: Prostate gland and seminal vesicles are unremarkable in appearance. Other: No significant volume of ascites.  No pneumoperitoneum. Musculoskeletal: Innumerable lytic lesions are noted throughout the visualized axial and appendicular skeleton, compatible with widespread involvement from patient's known multiple myeloma. The largest of these is in the left side of the sacrum measuring approximately 3.6 x 2.7 cm (image 239 of series 7). IMPRESSION: 1. No definite source for fever of unknown origin identified on  today's examination. 2. Widespread osseous involvement from the patient's known multiple myeloma, with what appears to be a chronic compression fracture at T6, as above. 3. Markedly dilated esophagus which contains fluid and debris. The possibility of an underlying esophageal mass in this region is not excluded, and correlation with nonemergent esophagram is suggested in the near future to better evaluate these findings. 4. Atherosclerosis, including left main and 3 vessel coronary artery disease. Please note that although the presence of coronary artery calcium documents the presence of coronary artery disease, the severity of this disease and any potential stenosis cannot be assessed on this non-gated CT examination. Assessment for potential risk factor modification, dietary therapy or pharmacologic therapy may be warranted, if clinically indicated. 5. Additional incidental findings, as above. Electronically Signed   By: Vinnie Langton M.D.   On: 06/20/2015 08:27     Scheduled Meds: . sodium chloride   Intravenous Once  . acetaminophen  650 mg Oral Once  . amoxicillin-clavulanate  1 tablet Oral BID  . calcitRIOL  0.25 mcg Oral Q T,Th,Sa-HD  . darbepoetin (ARANESP) injection - DIALYSIS  100 mcg Intravenous Q Tue-HD  . diphenhydrAMINE  25 mg Oral Once  . feeding supplement  1 Container Oral BID BM  . feeding supplement (ENSURE ENLIVE)  237 mL Oral BID BM  . fluconazole  100 mg Oral Daily  . heparin  5,000 Units Subcutaneous Q8H  . insulin aspart  0-9 Units Subcutaneous Q4H  . metoCLOPramide (REGLAN) injection  5 mg Intravenous Q6H  .  metoprolol tartrate  12.5 mg Oral BID  . montelukast  10 mg Oral QHS  . multivitamin  1 tablet Oral QHS  . pantoprazole (PROTONIX) IV  40 mg Intravenous Q12H  . simvastatin  10 mg Oral QHS  . sodium chloride flush  3 mL Intravenous Q12H  . sodium chloride flush  3 mL Intravenous Q12H  . valACYclovir  500 mg Oral Daily   Continuous Infusions:    LOS: 4 days     Time spent: 30 min    Janece Canterbury, MD [facility] Pager [phone]  If 7PM-7AM, please contact night-coverage [email or web address] Password [email or web address] 06/20/2015, 5:20 PM

## 2015-06-20 NOTE — Progress Notes (Signed)
Pharmacy Antibiotic Note  Eddie Thomas is a 70 y.o. male admitted on 06/16/2015 with aspiration PNA.  Pharmacy has been consulted for Unasyn dosing, as likely not absorbing PO form per MD. Tmax/24h 100.9, wbc low stable 3.7. ESRD on HD.  Plan: Unasyn 1.5g IV q12h Monitor clinical progress, c/s, LOT  Height: 5\' 11"  (180.3 cm) Weight: 136 lb 0.4 oz (61.7 kg) IBW/kg (Calculated) : 75.3  Temp (24hrs), Avg:99.3 F (37.4 C), Min:98.8 F (37.1 C), Max:99.8 F (37.7 C)   Recent Labs Lab 06/14/15 0649 06/15/15 0520 06/16/15 1934 06/16/15 1955 06/16/15 2230 06/17/15 0005 06/17/15 0245 06/19/15 0412 06/19/15 1006 06/20/15 0430  WBC 4.3 4.3 4.5  --   --   --  4.2 3.5*  --  3.7*  CREATININE 8.06*  --  3.77*  --   --   --  4.69*  --  3.70*  --   LATICACIDVEN  --   --   --  0.99 0.94 0.6 0.7  --   --   --     Estimated Creatinine Clearance: 16.4 mL/min (by C-G formula based on Cr of 3.7).    No Known Allergies  Antimicrobials this admission: Vanc 5/27 >>5/29 Zosyn 5/27 >> 5/29 Valtrex 5/28 >>5/31 Fluconazole 5/30 >>5/31 Augmentin 5/30>>5/31 Unasyn 5/31>>  Dose adjustments this admission:   Microbiology results: 5/27 BCx >> ngtd   Elicia Lamp, PharmD, [facility] Clinical Pharmacist Pager [phone] 06/20/2015 5:39 PM

## 2015-06-21 ENCOUNTER — Inpatient Hospital Stay (HOSPITAL_COMMUNITY): Payer: Medicare Other | Admitting: Certified Registered Nurse Anesthetist

## 2015-06-21 ENCOUNTER — Encounter (HOSPITAL_COMMUNITY)
Admission: EM | Disposition: A | Discharge status: Hospice | Payer: Self-pay | Source: Home / Self Care | Attending: Internal Medicine

## 2015-06-21 ENCOUNTER — Encounter (HOSPITAL_COMMUNITY): Payer: Self-pay

## 2015-06-21 DIAGNOSIS — R1314 Dysphagia, pharyngoesophageal phase: Secondary | ICD-10-CM | POA: Insufficient documentation

## 2015-06-21 DIAGNOSIS — K920 Hematemesis: Secondary | ICD-10-CM | POA: Insufficient documentation

## 2015-06-21 HISTORY — PX: ESOPHAGOGASTRODUODENOSCOPY: SHX5428

## 2015-06-21 LAB — GLUCOSE, CAPILLARY
GLUCOSE-CAPILLARY: 132 mg/dL — AB (ref 65–99)
GLUCOSE-CAPILLARY: 131 mg/dL — AB (ref 65–99)
GLUCOSE-CAPILLARY: 160 mg/dL — AB (ref 65–99)
Glucose-Capillary: 109 mg/dL — ABNORMAL HIGH (ref 65–99)
Glucose-Capillary: 129 mg/dL — ABNORMAL HIGH (ref 65–99)
Glucose-Capillary: 152 mg/dL — ABNORMAL HIGH (ref 65–99)

## 2015-06-21 LAB — BASIC METABOLIC PANEL
ANION GAP: 17 — AB (ref 5–15)
BUN: 43 mg/dL — ABNORMAL HIGH (ref 6–20)
CALCIUM: 8.2 mg/dL — AB (ref 8.9–10.3)
CO2: 22 mmol/L (ref 22–32)
CREATININE: 8.41 mg/dL — AB (ref 0.61–1.24)
Chloride: 100 mmol/L — ABNORMAL LOW (ref 101–111)
GFR, EST AFRICAN AMERICAN: 7 mL/min — AB (ref >60)
GFR, EST NON AFRICAN AMERICAN: 6 mL/min — AB (ref >60)
Glucose, Bld: 124 mg/dL — ABNORMAL HIGH (ref 65–99)
Potassium: 4.3 mmol/L (ref 3.5–5.1)
SODIUM: 139 mmol/L (ref 135–145)

## 2015-06-21 LAB — CBC
HCT: 24.9 % — ABNORMAL LOW (ref 39.0–52.0)
Hemoglobin: 8 g/dL — ABNORMAL LOW (ref 13.0–17.0)
MCH: 25.9 pg — AB (ref 26.0–34.0)
MCHC: 32.1 g/dL (ref 30.0–36.0)
MCV: 80.6 fL (ref 78.0–100.0)
PLATELETS: 83 10*3/uL — AB (ref 150–400)
RBC: 3.09 MIL/uL — AB (ref 4.22–5.81)
RDW: 17.1 % — ABNORMAL HIGH (ref 11.5–15.5)
WBC: 3.4 10*3/uL — AB (ref 4.0–10.5)

## 2015-06-21 LAB — CULTURE, BLOOD (ROUTINE X 2)
CULTURE: NO GROWTH
Culture: NO GROWTH

## 2015-06-21 LAB — PROTIME-INR
INR: 1.36 (ref 0.00–1.49)
Prothrombin Time: 16.9 seconds — ABNORMAL HIGH (ref 11.6–15.2)

## 2015-06-21 LAB — APTT: APTT: 31 s (ref 24–37)

## 2015-06-21 SURGERY — EGD (ESOPHAGOGASTRODUODENOSCOPY)
Anesthesia: Monitor Anesthesia Care

## 2015-06-21 MED ORDER — SODIUM CHLORIDE 0.9 % IV SOLN
8.0000 mg/h | INTRAVENOUS | Status: AC
  Administered 2015-06-21 – 2015-06-24 (×5): 8 mg/h via INTRAVENOUS
  Filled 2015-06-21 (×13): qty 80

## 2015-06-21 MED ORDER — PROPOFOL 500 MG/50ML IV EMUL
INTRAVENOUS | Status: DC | PRN
  Administered 2015-06-21: 100 ug/kg/min via INTRAVENOUS

## 2015-06-21 MED ORDER — GI COCKTAIL ~~LOC~~
30.0000 mL | Freq: Once | ORAL | Status: AC
  Administered 2015-06-21: 30 mL via ORAL
  Filled 2015-06-21: qty 30

## 2015-06-21 MED ORDER — CALCITRIOL 0.25 MCG PO CAPS
ORAL_CAPSULE | ORAL | Status: AC
  Filled 2015-06-21: qty 1

## 2015-06-21 MED ORDER — VITAMIN K1 10 MG/ML IJ SOLN
5.0000 mg | Freq: Once | INTRAMUSCULAR | Status: AC
  Administered 2015-06-21: 5 mg via SUBCUTANEOUS
  Filled 2015-06-21: qty 0.5

## 2015-06-21 MED ORDER — SODIUM CHLORIDE 0.9 % IV SOLN
INTRAVENOUS | Status: DC | PRN
  Administered 2015-06-21: 09:00:00 via INTRAVENOUS

## 2015-06-21 MED ORDER — METOCLOPRAMIDE HCL 5 MG/ML IJ SOLN
5.0000 mg | Freq: Four times a day (QID) | INTRAMUSCULAR | Status: DC
  Administered 2015-06-21 – 2015-06-23 (×7): 5 mg via INTRAVENOUS
  Filled 2015-06-21 (×7): qty 2

## 2015-06-21 MED ORDER — BUTAMBEN-TETRACAINE-BENZOCAINE 2-2-14 % EX AERO
INHALATION_SPRAY | CUTANEOUS | Status: DC | PRN
  Administered 2015-06-21: 2 via TOPICAL

## 2015-06-21 MED ORDER — LORAZEPAM 2 MG/ML IJ SOLN
1.0000 mg | INTRAMUSCULAR | Status: DC | PRN
  Administered 2015-06-21 – 2015-06-23 (×5): 1 mg via INTRAVENOUS
  Filled 2015-06-21 (×5): qty 1

## 2015-06-21 NOTE — H&P (View-Only) (Signed)
[facility] Consult: 10:08 AM 06/20/2015  LOS: 4 days    Referring Provider: Dr Sheran Fava  Primary Care Physician:  [name] Primary Gastroenterologist:  Dr. Fuller Plan    Reason for Consultation:  Coffee ground emesis.     HPI: Eddie Thomas is a 70 y.o. male.  Type 2 DM.  Multiple myeloma since 2012, refractory to multiple regimens but latest chemo with Velcade/Daratumumab.  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  DVT and PE in 2016, on Xarelto.  Thrombocytopenia, anemia, pancytopenia. .   04/02/15 esophagram. 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  Differential diagnosis includes benign stricture and Barrett's esophagus, with esophageal carcinoma considered less likely.  Endoscopy is recommended for further evaluation.  05/16/15 EGD for dysphagia and abnormal UGI series. Dilation of benign esoph stricture, candida esophagitis and small HH.  01/2012 EGD for n/v, wt loss. Severe esophagitis. [facility] Colonoscopy. Dr Virgel Bouquet. Colon polyp.   Pt seen by GI on 5/25 as inpt during 5/12 - 5/26 admission for CG emesis.  Had persistent hiccups.  Dr Ardis Hughs increased Protonix to BID and added oral Reglan.  Hgb at discharge 8.3.  Came to ED and readmitted on 5/27 with recurrent fever to 101 post seemingly uneventful HD session.   Weakness, ongoing nausea, dysphagia with small volume CG and more reddish/pink/bloody emesis 10 to 14 x per day, ongoing hiccups since discharge.  Has discomfort in region of mid esophagus, not worse with swallowing.     Non contrast CT chest/abd/pelvis:  Dilated and debris filled esophagus, can not rule out underlying mass.  Widespread osseous MM.  Multiple small liver  lesions are likely benign cysts.  Likely GB sludge.  Labs confirm pancytopenia.  He has received PRBC x 1, Hgb 8.9, up from 7.2.   Empiric diflucan restarted today.  Oral PPI changed to Protonix drip today due to the bloody emesis.  Xarelto is on hold. No doses since admission.     Past Medical History  Diagnosis Date  . Diabetes mellitus ([place]) 07/03/2011  . Hypertension 07/03/2011  . Hyperlipidemia 07/03/2011  . Colon polyps 2012  . Gastric ulcer   . Bulging lumbar disc   . Encounter for antineoplastic chemotherapy 03/05/2015  . Anemia   . Blood transfusion without reported diagnosis 2015  . Multiple myeloma 2011    currently undergoing chemo  . Cataract     Past Surgical History  Procedure Laterality Date  . Limbal stem cell transplant    . Humerus fracture surgery  2011    right  . 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  . Cataract extraction w/ intraocular lens implant Bilateral left 3/17, right 4/17  . Av fistula placement Left 06/08/2015    Procedure: LEFT UPPER ARM ARTERIOVENOUS (AV) FISTULA CREATION;  Surgeon: Mal Misty, MD;  Location: [place];  Service: Vascular;  Laterality: Left;    Prior to Admission medications  Medication Sig Start Date End Date Taking? Authorizing Provider  calcitRIOL (ROCALTROL) 0.25 MCG capsule Take 1 capsule (0.25 mcg total) by mouth Every Tuesday,Thursday,and Saturday with dialysis. 06/15/15  Yes Orson Eva, MD  chlorproMAZINE (THORAZINE) 25 MG tablet Take 1 tablet (25 mg total) by mouth 3 (three) times daily as needed for hiccoughs. 06/15/15  Yes Orson Eva, MD  feeding supplement, ENSURE ENLIVE, (ENSURE ENLIVE) LIQD Take 237 mLs by mouth 2 (two) times daily between meals. 06/15/15  Yes Orson Eva, MD  glipiZIDE (GLUCOTROL) 5 MG tablet Take 0.5 tablets (2.5 mg total) by mouth daily before breakfast. 06/15/15  Yes Orson Eva, MD  lidocaine-prilocaine (EMLA) cream Apply 1 application topically as needed. Apply one hour before use  and cover with plastic wrap. 10/17/14  Yes Adrena E Johnson, PA-C  metoCLOPramide (REGLAN) 5 MG tablet Take 1 tablet (5 mg total) by mouth 3 (three) times daily before meals. 06/15/15  Yes Orson Eva, MD  metoprolol tartrate (LOPRESSOR) 25 MG tablet Take 0.5 tablets (12.5 mg total) by mouth 2 (two) times daily. 06/15/15  Yes Orson Eva, MD  montelukast (SINGULAIR) 10 MG tablet Take 10 mg by mouth at bedtime.  04/18/15 04/17/16 Yes Historical Provider, MD  multivitamin (RENA-VIT) TABS tablet Take 1 tablet by mouth at bedtime. 06/15/15  Yes Orson Eva, MD  pantoprazole (PROTONIX) 40 MG tablet TAKE ONE TABLET BY MOUTH TWICE DAILY 02/02/15  Yes Ladene Artist, MD  prochlorperazine (COMPAZINE) 10 MG tablet Take 10 mg by mouth every 6 (six) hours as needed for nausea or vomiting. Reported on 05/14/2015   Yes Historical Provider, MD  sevelamer carbonate (RENVELA) 800 MG tablet Take 1 tablet (800 mg total) by mouth 3 (three) times daily with meals. 06/15/15  Yes Orson Eva, MD  simvastatin (ZOCOR) 10 MG tablet Take 10 mg by mouth at bedtime.     Yes Historical Provider, MD  valACYclovir (VALTREX) 500 MG tablet TAKE ONE CAPLET BY MOUTH ONCE DAILY 01/24/15  Yes Curt Bears, MD  VIAGRA 50 MG tablet Take 50 mg by mouth daily as needed for erectile dysfunction.  02/23/13  Yes Historical Provider, MD  vitamin B-12 500 MCG tablet Take 1 tablet (500 mcg total) by mouth daily. 06/15/15  Yes Orson Eva, MD    Scheduled Meds: . sodium chloride   Intravenous Once  . acetaminophen  650 mg Oral Once  . amoxicillin-clavulanate  1 tablet Oral BID  . calcitRIOL  0.25 mcg Oral Q T,Th,Sa-HD  . darbepoetin (ARANESP) injection - DIALYSIS  100 mcg Intravenous Q Tue-HD  . diphenhydrAMINE  25 mg Oral Once  . feeding supplement  1 Container Oral BID BM  . feeding supplement (ENSURE ENLIVE)  237 mL Oral BID BM  . fluconazole  100 mg Oral Daily  . heparin  5,000 Units Subcutaneous Q8H  . insulin aspart  0-9 Units Subcutaneous Q4H  .  metoCLOPramide (REGLAN) injection  5 mg Intravenous Q6H  . metoprolol tartrate  12.5 mg Oral BID  . montelukast  10 mg Oral QHS  . multivitamin  1 tablet Oral QHS  . [START ON 06/23/2015] pantoprazole (PROTONIX) IV  40 mg Intravenous Q12H  . simvastatin  10 mg Oral QHS  . sodium chloride flush  3 mL Intravenous Q12H  . sodium chloride flush  3 mL Intravenous Q12H  . valACYclovir  500 mg Oral Daily   Infusions: . pantoprozole (PROTONIX) infusion 8 mg/hr (06/20/15 0030)   PRN Meds: sodium chloride, acetaminophen **OR** acetaminophen, chlorproMAZINE, HYDROmorphone (  DILAUDID) injection, metoCLOPramide (REGLAN) injection, ondansetron **OR** ondansetron (ZOFRAN) IV, oxyCODONE, sodium chloride flush, sodium chloride flush, zolpidem   Allergies as of 06/16/2015  . (No Known Allergies)    Family History  Problem Relation Age of Onset  . Diabetes Mother   . Hyperlipidemia Mother   . Diabetes Sister   . Diabetes Brother   . Colon cancer Maternal Uncle 43    Social History   Social History  . Marital Status: Married    Spouse Name: N/A  . Number of Children: 2  . Years of Education: N/A   Occupational History  . retired    Social History Main Topics  . Smoking status: Never Smoker   . Smokeless tobacco: Never Used  . Alcohol Use: No  . Drug Use: No  . Sexual Activity: Not on file   Other Topics Concern  . Not on file   Social History Narrative    REVIEW OF SYSTEMS: Constitutional:  26 # weight loss in less than 1 month.  + weakness, no falls.  ENT:  No nose bleeds Pulm:  No dyspnea.  Chest pain per HPI CV:  No palpitations, no LE edema.  GU:  No hematuria, no frequency GI:  Per HPI Heme:  Per HPI   Transfusions:  Per HPI Neuro:  No headaches, no peripheral tingling or numbness MS:  No bone pain, no joint aches.  Derm:  No itching, no rash or sores.  Endocrine:  No sweats or chills.  No polyuria or dysuria Immunization:  Not queried.  Travel:  None beyond local  counties in last few months.    PHYSICAL EXAM: Vital signs in last 24 hours: Filed Vitals:   06/20/15 0417 06/20/15 0827  BP: 141/70 125/66  Pulse: 104 110  Temp: 99.3 F (37.4 C) 99.4 F (37.4 C)  Resp: 18 17   Wt Readings from Last 3 Encounters:  06/19/15 61.7 kg (136 lb 0.4 oz)  06/14/15 62.732 kg (138 lb 4.8 oz)  05/16/15 73.483 kg (162 lb)    General: pleasant, thin, alert AAM with hiccups Head:  No swelling or asymmetry  Eyes:  No icterus or pallor.   Ears:  Not HOH.  Nose:  No discharge.   Mouth:  Moist, clear oral MM.   Neck:  No mass, no JVD Lungs:  Clear bil.  No cough or dyspnea. Dialysis catheter on right upper chest Heart: irreg, mildly tachy.  Abdomen:  Soft, NT, ND.  BS hypoactive.   Rectal: deferred   Musc/Skeltl: no joint swelling or contractures Extremities:  No CCE.    Neurologic:  Oriented x 3.  Fully alert. Moves all 4s.  No tremor, no limb weakness.  Skin:  No sores, rash or lesions Psych:  Flat affect, pleasant, calm and cooperative but seems depressed.   Intake/Output from previous day: 05/30 0701 - 05/31 0700 In: 815 [P.O.:480; Blood:335] Out: 2635  Intake/Output this shift:    LAB RESULTS:  Recent Labs  06/19/15 0412 06/20/15 0430  WBC 3.5* 3.7*  HGB 7.2* 8.9*  HCT 22.9* 28.3*  PLT 73* 69*   BMET Lab Results  Component Value Date   NA 135 06/19/2015   NA 134* 06/17/2015   NA 133* 06/16/2015   K 3.8 06/19/2015   K 4.7 06/17/2015   K 4.5 06/16/2015   CL 96* 06/19/2015   CL 97* 06/17/2015   CL 95* 06/16/2015   CO2 26 06/19/2015   CO2 26 06/17/2015   CO2 24 06/16/2015  GLUCOSE 120* 06/19/2015   GLUCOSE 141* 06/17/2015   GLUCOSE 179* 06/16/2015   BUN 13 06/19/2015   BUN 20 06/17/2015   BUN 15 06/16/2015   CREATININE 3.70* 06/19/2015   CREATININE 4.69* 06/17/2015   CREATININE 3.77* 06/16/2015   CALCIUM 8.0* 06/19/2015   CALCIUM 7.8* 06/17/2015   CALCIUM 8.1* 06/16/2015   LFT  Recent Labs  06/19/15 1006    ALBUMIN 2.2*   PT/INR Lab Results  Component Value Date   INR 1.44 06/15/2015   INR 1.47 06/14/2015   INR 1.77* 06/13/2015   Hepatitis Panel No results for input(s): HEPBSAG, HCVAB, HEPAIGM, HEPBIGM in the last 72 hours. C-Diff No components found for: CDIFF Lipase     Component Value Date/Time   LIPASE 38 12/31/2011 1420    Drugs of Abuse  No results found for: LABOPIA, COCAINSCRNUR, LABBENZ, AMPHETMU, THCU, LABBARB   RADIOLOGY STUDIES: Ct Abdomen Pelvis Wo Contrast Ct Chest Wo Contrast 06/20/2015  CLINICAL DATA:  70 year old male with history of fever of unknown origin. EXAM: CT CHEST, ABDOMEN AND PELVIS WITHOUT CONTRAST TECHNIQUE: Multidetector CT imaging of the chest, abdomen and pelvis was performed following the standard protocol without IV contrast. COMPARISON:  No priors. FINDINGS: CT CHEST FINDINGS Mediastinum/Lymph Nodes: Heart size is normal. There is no significant pericardial fluid, thickening or pericardial calcification. There is atherosclerosis of the thoracic aorta, the great vessels of the mediastinum and the coronary arteries, including calcified atherosclerotic plaque in the left main, left anterior descending, left circumflex and right coronary arteries. No pathologically enlarged mediastinal or hilar lymph nodes. Please note that accurate exclusion of hilar adenopathy is limited on noncontrast CT scans. Esophagus is dilated in contains a large amount of fluid and/or retained solid food. The possibility of esophageal mass is not entirely excluded. No axillary lymphadenopathy. Left-sided internal jugular single-lumen porta cath with tip terminating at the superior cavoatrial junction. Right-sided internal jugular dialysis catheter with tip terminating in the right atrium. Lungs/Pleura: No suspicious appearing pulmonary nodules or masses. No acute consolidative airspace disease. No pleural effusions. Minimal dependent subsegmental atelectasis in the lower lobes of the  lungs bilaterally. Musculoskeletal/Soft Tissues: There multiple lytic lesions noted throughout the visualized axial and appendicular skeleton, presumably related to the patient's multiple myeloma. The largest of these is in the T6 vertebral body, where there is what appears to be a chronic compression fracture with approximately 40% loss of anterior and 30% loss of posterior vertebral body height. CT ABDOMEN AND PELVIS FINDINGS Hepatobiliary: Multiple small subcentimeter low-attenuation hepatic lesions are incompletely characterized on today's noncontrast CT examination, but are statistically likely to represent cysts. Unenhanced appearance of the gallbladder is remarkable for some dependent amorphous intermediate attenuation material which likely represents some biliary sludge. No findings to suggest an acute cholecystitis at this time. Pancreas: No pancreatic mass or peripancreatic inflammatory changes identified on today's noncontrast CT examination. Spleen: Unremarkable. Adrenals/Urinary Tract: 1.2 cm high attenuation lesion in the interpolar region of the right kidney is incompletely characterize, but likely to represent a proteinaceous/hemorrhagic cyst. Other low-attenuation lesions in the interpolar and lower pole region of the right kidney are also incompletely characterized, but favored to represent cysts. Unenhanced appearance of the left kidney is normal. Bilateral adrenal glands are normal in appearance. No hydroureteronephrosis. Urinary bladder is nearly completely decompressed, but otherwise unremarkable in appearance. Stomach/Bowel: The unenhanced appearance of the stomach is normal. There is no pathologic dilatation of small bowel or colon. Normal appendix. Vascular/Lymphatic: Atherosclerosis throughout the abdominal and pelvic vasculature, without  evidence of aneurysm. Circumaortic left renal vein (normal anatomical variant) incidentally noted. No lymphadenopathy noted in the abdomen or pelvis on  today's noncontrast CT examination. Reproductive: Prostate gland and seminal vesicles are unremarkable in appearance. Other: No significant volume of ascites.  No pneumoperitoneum. Musculoskeletal: Innumerable lytic lesions are noted throughout the visualized axial and appendicular skeleton, compatible with widespread involvement from patient's known multiple myeloma. The largest of these is in the left side of the sacrum measuring approximately 3.6 x 2.7 cm (image 239 of series 7). IMPRESSION: 1. No definite source for fever of unknown origin identified on today's examination. 2. Widespread osseous involvement from the patient's known multiple myeloma, with what appears to be a chronic compression fracture at T6, as above. 3. Markedly dilated esophagus which contains fluid and debris. The possibility of an underlying esophageal mass in this region is not excluded, and correlation with nonemergent esophagram is suggested in the near future to better evaluate these findings. 4. Atherosclerosis, including left main and 3 vessel coronary artery disease. Please note that although the presence of coronary artery calcium documents the presence of coronary artery disease, the severity of this disease and any potential stenosis cannot be assessed on this non-gated CT examination. Assessment for potential risk factor modification, dietary therapy or pharmacologic therapy may be warranted, if clinically indicated. 5. Additional incidental findings, as above. Electronically Signed   By: Vinnie Langton M.D.   On: 06/20/2015 08:27   Dg Chest 2 View  06/18/2015  CLINICAL DATA:  Dyspnea. Chest discomfort 3 hours. Hypertension and diabetes. Dialysis patient. EXAM: CHEST  2 VIEW COMPARISON:  06/16/2015. FINDINGS: Right jugular dual-lumen dialysis catheter in the lower SVC. Port-A-Cath tip in the SVC. These are unchanged. Negative for pulmonary edema. No infiltrate or effusion. Lungs are clear. IMPRESSION: No active  cardiopulmonary disease. Electronically Signed   By: Franchot Gallo M.D.   On: 06/18/2015 18:16   ENDOSCOPIC STUDIES: Per HPI  IMPRESSION:   * CG and bloody emesis.   05/16/15 EGD for dysphagia: candida esophagitis, dilation of esophageal stricture, small HH. Completed course of Diflucan. Protonix increased to BID and Reglan added 6 days ago.    Swallowing improved but persistent/intractable hiccups. CT scan shows dilated esophagus full of food/liquid.    * Hiccups. Meds tried in past include: ativan, baclofen, thorazine. All discontinued for different reasons.   * ESRD and initiated on HD in last 2 weeks. AKI felt due to MM or drug induced nephropathy.   * Anemia. Due to MM and kidney dx.  Thrombocytopenia. Pancytopenia. Initiated on epo and transfused PRBC x 2 during admission ending last week.  Now s/p 1 PRBC on 5/30.     *  Hx DVT and PE.  Chronic Xarelto on hold.  Last dose ~ 5/27. Receiving TID SQ Heparin.     * Multiple myeloma. Not clear from notes if disease is progressive but hx of being refractory to chemo regimens. Last chemo on 06/01/15.  Widespread bony involvement per CT scan.     PLAN:     *  EGD.  Tomorrow 0900.  Hold Heparin after midnight *  Since bloody emesis is likely due to esophagitis and not hemorrhage from an ulcer, does not need PPI drip.  Will change to BID IV PPI.  Leave empiric Diflucan in place.     Azucena Freed  06/20/2015, 10:08 AM Pager: [phone]     Attending physician's note   I have taken a history, examined the patient and reviewed  the chart. I agree with the Advanced Practitioner's note, impression and recommendations. Coffee ground/pink emesis, hiccups, nausea and a dilated, fluid filled esophagus on CT. R/O a persistent or worsening esophageal stricture, esophagitis, achalasia or other motility disorder. Continue to hold Xarelto, IV PPI bid, IV Reglan, IV antiemetics for now. EGD with possible dilation tomorrow.   [name], MD [facility] [phone] Mon-Fri 8a-5p [phone] after 5p, weekends, holidays

## 2015-06-21 NOTE — Transfer of Care (Signed)
Immediate Anesthesia Transfer of Care Note  Patient: Eddie Thomas  Procedure(s) Performed: Procedure(s): ESOPHAGOGASTRODUODENOSCOPY (EGD) (N/A)  Patient Location: PACU  Anesthesia Type:MAC  Level of Consciousness: awake, alert  and oriented  Airway & Oxygen Therapy: Patient Spontanous Breathing and Patient connected to nasal cannula oxygen  Post-op Assessment: Report given to RN and Post -op Vital signs reviewed and stable  Post vital signs: Reviewed and stable  Last Vitals:  Filed Vitals:   06/21/15 0752 06/21/15 0832  BP: 143/65 175/72  Pulse: 89 91  Temp: 37 C 36.8 C  Resp: 20 20    Last Pain:  Filed Vitals:   06/21/15 0835  PainSc: 0-No pain         Complications: No apparent anesthesia complications

## 2015-06-21 NOTE — Op Note (Signed)
[facility] Patient Name: Eddie Thomas Procedure Date : 06/21/2015 MRN: [id number] Attending MD: Ladene Artist , MD Date of Birth: 12-22-1945 CSN: [id number] Age: 70 Admit Type: Inpatient Procedure:                Upper GI endoscopy Indications:              Dysphagia, Hematemesis Providers:                Pricilla Riffle. Fuller Plan, MD, Carolynn Comment, RN,                            Joeanthony Dalton, Technician Referring MD:             [facility] Medicines:                Monitored Anesthesia Care Complications:            No immediate complications. Estimated Blood Loss:     Estimated blood loss was minimal. Procedure:                Pre-Anesthesia Assessment:                           - Prior to the procedure, a History and Physical                            was performed, and patient medications and                            allergies were reviewed. The patient's tolerance of                            previous anesthesia was also reviewed. The risks                            and benefits of the procedure and the sedation                            options and risks were discussed with the patient.                            All questions were answered, and informed consent                            was obtained. Prior Anticoagulants: The patient has                            taken Xarelto (rivaroxaban), last dose was 4 days                            prior to procedure. ASA Grade Assessment: III - A                            patient with severe systemic disease. After  reviewing the risks and benefits, the patient was                            deemed in satisfactory condition to undergo the                            procedure.                           After obtaining informed consent, the endoscope was                            passed under direct vision. Throughout the                            procedure, the patient's blood  pressure, pulse, and                            oxygen saturations were monitored continuously. The                            [id number]) scope was introduced through the                            mouth, and advanced to the second part of duodenum.                            The upper GI endoscopy was somewhat difficult due                            to excessive bleeding. Successful completion of the                            procedure was aided by lavage. The patient                            tolerated the procedure well. Scope In: Scope Out: Findings:      LA Grade D (one or more mucosal breaks involving at least 75% of       esophageal circumference) esophagitis with bleeding was found in the       lower third and middle third of the esophagus. Biopsies were taken with       a cold forceps for histology. A mild intrinsic stricture was noted at       the [facility] which was easily traversed. It appeared improved compared to       before the dilation in late April. Repeat dilation was not performed due       to active bleeding.      Blood clot was found in the lower third of the esophagus crossing the       EGJ. It was adherent and partially obstructing the lumen. The clot was       gently advanced into the stomach. Oozing esophagtis with no discete or       focal lesion was noted just above the EGJ at the site the clot was  previously covering.      A small hiatal hernia was present on rertoflexed view.      The exam of the stomach was otherwise normal.      The duodenal bulb and second portion of the duodenum were normal. Impression:               - Hemorrhagic LA Grade D reflux esophagitis.                            Biopsied.                           - Blood clot and active oozing in the lower third                            of the esophagus.                           - Small hiatal hernia.                           - Mild distal esophageal stricture Moderate  Sedation:      none Recommendation:           - Return patient to hospital ward for ongoing care.                           - Clear liquid diet.                           - IV PPI infusion for 3 days then PPI bid long term.                           - Avoid all anticoagulation for now                           - Await pathology                           - Continue present medications. Procedure Code(s):        --- Professional ---                           [phone], Esophagogastroduodenoscopy, flexible,                            transoral; with biopsy, single or multiple Diagnosis Code(s):        --- Professional ---                           K21.0, Gastro-esophageal reflux disease with                            esophagitis                           K22.8, Other specified diseases of esophagus  K44.9, Diaphragmatic hernia without obstruction or                            gangrene                           R13.10, Dysphagia, unspecified                           K92.0, Hematemesis CPT copyright 2016 [employer]. All rights reserved. The codes documented in this report are preliminary and upon coder review may  be revised to meet current compliance requirements. Ladene Artist, MD 06/21/2015 10:04:59 AM This report has been signed electronically. Number of Addenda: 0

## 2015-06-21 NOTE — Anesthesia Postprocedure Evaluation (Signed)
Anesthesia Post Note  Patient: Eddie Thomas  Procedure(s) Performed: Procedure(s) (LRB): ESOPHAGOGASTRODUODENOSCOPY (EGD) (N/A)  Patient location during evaluation: Endoscopy Anesthesia Type: MAC Level of consciousness: awake and alert and oriented Pain management: satisfactory to patient Vital Signs Assessment: post-procedure vital signs reviewed and stable Respiratory status: spontaneous breathing Cardiovascular status: blood pressure returned to baseline and stable Anesthetic complications: no    Last Vitals:  Filed Vitals:   06/21/15 0832 06/21/15 0951  BP: 175/72 158/77  Pulse: 91   Temp: 36.8 C 36.9 C  Resp: 20 19    Last Pain:  Filed Vitals:   06/21/15 0952  PainSc: 0-No pain                 Clearnce Sorrel

## 2015-06-21 NOTE — Interval H&P Note (Signed)
History and Physical Interval Note:  06/21/2015 9:08 AM  Eddie Thomas  has presented today for surgery, with the diagnosis of bloody emesis and hiccups, dilated esophagus.  dysphagia  The various methods of treatment have been discussed with the patient and family. After consideration of risks, benefits and other options for treatment, the patient has consented to  Procedure(s): ESOPHAGOGASTRODUODENOSCOPY (EGD) (N/A) as a surgical intervention .  The patient's history has been reviewed, patient examined, no change in status, stable for surgery.  I have reviewed the patient's chart and labs.  Questions were answered to the patient's satisfaction.     Pricilla Riffle. Fuller Plan

## 2015-06-21 NOTE — Progress Notes (Signed)
PROGRESS NOTE  DEOVION BATREZ  RJJ:[id number] DOB: 03/31/1945 DOA: 06/16/2015 PCP: [name]  Brief Narrative:  70 y.o. male with medical history significant of multiple myeloma, IgA subtype, currently undergoing treatment with Velcade/Daratumumab at the cancer center, follows Dr. Julien Nordmann of medical oncology, also having a history of chronic kidney disease (with baseline creatinine near 1.6-2.0), hypertension, diabetes mellitus, presented as a transfer from the cancer center for acute renal failure with creatinine of 10 on 06/01/15. Nephrology was consulted, and the patient was initiated on dialysis during his last hospitalization. A tunnel dialysis catheter and AVF was placed by [facility]. He was discharged in stable condition on 06/15/15. Unfortunately, the patient developed a fever up to 101.32F with associated nausea and vomiting after his dialysis session on 06/15/2015. After admission, the patient has had a temperature of 100.9F with tachycardia and soft blood pressures. Nephrology has been consulted to assist with management.  Summary of previous hospitalization--Intractable hiccups- unclear etiology. ABLA complicating chronic anemia > multiple antibodies on type and screen- plan for 2 PRBC's on 5/23. Acute AMS developed on 06/12/15 which was likely multifactorial including medications and possible new infection as pt had a fever up to 101.6. Since then, his mental status has improved and returned back to baseline. On 06/14/15, pt c/o of cp and sob. With his hx of DVT, V/QScan was obtained and it was negative. In addition, the patient had coffee grounds emesis during hospitalization. He was evaluated by [facility]. They did not feel the patient needed an additional EGD at this time as he has had recent EGD on 05/16/2015. His PPI was increased to twice a day, and Reglan was added.  Started vomiting maroon colored blood on 5/30 with inability to tolerate PO.  EGD demonstrated grade D  hemorrhagic esophagitis with clot and oozing.  Assessment & Plan:   Principal Problem:   SIRS (systemic inflammatory response syndrome) (HCC) Active Problems:   Multiple myeloma (HCC)   Diabetes mellitus (HCC)   Antineoplastic chemotherapy induced anemia   Hypoalbuminemia due to protein-calorie malnutrition (HCC)   Generalized weakness   ESRD on hemodialysis (HCC)   Nausea and vomiting   Aspiration pneumonitis (HCC)   Pyrexia   Protein-calorie malnutrition, severe   FUO (fever of unknown origin)   Hematemesis with nausea   Dysphagia, pharyngoesophageal phase  SIRS/Fever, suspect aspiration pneumonitis given his frequent hematemesis and partially obstructed esophagus vs neoplastic fever from widespread myeloma.   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  - Nephrology suggested palliative care consultation, to be pursued by oncology as outpatient. - TDC and AVF placed by [facility] -  Outpatient TTS dialysis from [facility]  Acute encephalopathy, resolved likely multifactorial including infection, meds, worsen anemia -developed between 5/22 night and 5/23 AM -06/12/15 CT brain negative -06/13/15--mental status improving -Discontinued Thorazine, baclofen, opioids, gabapentin, other hypnotics -ammonia--24,  -B12--266 -TSH--0.493, Free T4--1.04 -with B12 in low normal range-->started supplementation -Mental status presently at baseline  Grade 2 reflux esophagitis resulting in intractable hiccups, intermittent vomiting and coffee grounds emesis due to grade D reflux esophagitis with stricture and retained esophageal debris/fluid - GI assistance appreciated - EGD on 6/1 found hemorrhagic esophagitis, biopsies obtained but stricture not addressed due to concern for  worsening hemorrhage  Atypical Chest Pain, likely due to esophageal stricture and dilation -developed 06/14/15 -  VQ scan neg -EKG-no concerning ischemic changes -troponins minimally elevated due to ESRD  Multiple Myeloma: - Follows with Dr Julien Nordmann.  -refractory to multiple chemo regimens and SCT in 2012 - Palliative care consult  Anemia of neoplastic disease and acute blood loss - due to multiple myeloma, transfused 2 units PRBC earlier on 06/04/15. - Transfused 2 units of PRBC 5/23 and follow posttransfusion hemoglobin. Blood bank called and stated patient has multiple antibodies related to his myeloma medications but director of blood bank has cleared the available blood for transfusion. Verified same with oncologist who has had similar experience in the past. -  Hemoglobin responded to 2 units PRBC on 5/30  Pancytopenia - Secondary to multiple myeloma and related cancer chemotherapy. - Anemia management as above. Thrombocytopenia marginally improved.  NSVT: - Patient had 15 beats of nonsustained V. tach, hypomagnesemia corrected,  - 2-D echo: Normal EF. - continue IV metoprolol for now  History of DVT - Xarelto DC'ed given renal function (took Xarelto approximately one year) - Not good anticoagulation candidate due to bleeding complications.  -  Dr. Julien Nordmann aware  Essential hypertension - ACEI stopped due to acute renal failure.  - continue IV metoprolol for now  Diabetes mellitus type 2 -CBGs remained largely controlled -novolog sliding scale for now  Severe protein calorie malnutrition -  NPO for now, but will start supplements and liberalized diet post procedure  DVT prophylaxis:  heparin Code Status:  DNR Family Communication:  Patient alone Disposition Plan:  Awaiting improvement in esophagitis.  Continue IV medications for now   Consultants:   GI  Nephrology  Vascular surgery  Procedures:  none  Antimicrobials:   Zosyn   Augmentin  Unasyn     Subjective: still vomiting blood and having hiccoughs. Hungry.  Objective: Filed Vitals:   06/21/15 0832 06/21/15 0951 06/21/15 1000 06/21/15 1330  BP: 175/72 158/77 144/49 158/95  Pulse: 91  88 107  Temp: 98.3 F (36.8 C) 98.4 F (36.9 C)  98.9 F (37.2 C)  TempSrc: Oral Oral  Oral  Resp: 20 19 19 16   Height: 5' 11"  (1.803 m)     Weight: 60.782 kg (134 lb)   60.8 kg (134 lb 0.6 oz)  SpO2: 100% 100% 100% 100%    Intake/Output Summary (Last 24 hours) at 06/21/15 1419 Last data filed at 06/21/15 0941  Gross per 24 hour  Intake    210 ml  Output      1 ml  Net    209 ml   Filed Weights   06/20/15 1938 06/21/15 0832 06/21/15 1330  Weight: 60.963 kg (134 lb 6.4 oz) 60.782 kg (134 lb) 60.8 kg (134 lb 0.6 oz)    Examination:  General exam:  Cachectic adult male.  No acute distress.  HEENT:  NCAT, MMM Respiratory system:  Rales at the bilateral bases Cardiovascular system: Tachycardic, regular rhythm, normal S1/S2. No murmurs, rubs, gallops or clicks.  Warm extremities Gastrointestinal system: Normal active bowel sounds, soft, nondistended, mild TTP in the LUQ without rebound or guarding MSK:  Normal tone and bulk, no lower extremity edema Neuro:  Grossly intact    Data Reviewed: I have personally reviewed following labs and imaging studies  CBC:  Recent Labs Lab 06/16/15 1934 06/17/15 0245 06/19/15 0412 06/20/15 0430 06/21/15 0450  WBC 4.5 4.2 3.5* 3.7* 3.4*  NEUTROABS  --   --  3.0  --   --   HGB 8.6* 7.9* 7.2* 8.9* 8.0*  HCT 27.1* 25.1* 22.9*  28.3* 24.9*  MCV 83.1 83.7 82.1 80.9 80.6  PLT 89* 72* 73* 69* 83*   Basic Metabolic Panel:  Recent Labs Lab 06/16/15 1934 06/17/15 0245 06/19/15 1006 06/21/15 0450  NA 133* 134* 135 139  K 4.5 4.7 3.8 4.3  CL 95* 97* 96* 100*  CO2 24 26 26 22   GLUCOSE 179* 141* 120* 124*  BUN 15 20 13  43*  CREATININE 3.77* 4.69* 3.70* 8.41*  CALCIUM 8.1* 7.8* 8.0* 8.2*  PHOS  --   --  1.7*  --    GFR: Estimated  Creatinine Clearance: 7.1 mL/min (by C-G formula based on Cr of 8.41). Liver Function Tests:  Recent Labs Lab 06/19/15 1006  ALBUMIN 2.2*   No results for input(s): LIPASE, AMYLASE in the last 168 hours. No results for input(s): AMMONIA in the last 168 hours. Coagulation Profile:  Recent Labs Lab 06/15/15 0520 06/21/15 1150  INR 1.44 1.36   Cardiac Enzymes:  Recent Labs Lab 06/15/15 0520 06/15/15 1026 06/18/15 1840 06/18/15 2315 06/19/15 0412  TROPONINI 0.04* 0.03 0.04* 0.03 0.05*   BNP (last 3 results) No results for input(s): PROBNP in the last 8760 hours. HbA1C: No results for input(s): HGBA1C in the last 72 hours. CBG:  Recent Labs Lab 06/20/15 1937 06/21/15 0001 06/21/15 0405 06/21/15 0751 06/21/15 1202  GLUCAP 164* 129* 132* 131* 160*   Lipid Profile: No results for input(s): CHOL, HDL, LDLCALC, TRIG, CHOLHDL, LDLDIRECT in the last 72 hours. Thyroid Function Tests: No results for input(s): TSH, T4TOTAL, FREET4, T3FREE, THYROIDAB in the last 72 hours. Anemia Panel: No results for input(s): VITAMINB12, FOLATE, FERRITIN, TIBC, IRON, RETICCTPCT in the last 72 hours. Urine analysis:    Component Value Date/Time   COLORURINE YELLOW 06/01/2015 [place] 06/01/2015 1540   LABSPEC 1.008 06/01/2015 1540   PHURINE 6.0 06/01/2015 1540   GLUCOSEU NEGATIVE 06/01/2015 1540   HGBUR TRACE* 06/01/2015 1540   BILIRUBINUR NEGATIVE 06/01/2015 1540   KETONESUR NEGATIVE 06/01/2015 1540   PROTEINUR 30* 06/01/2015 1540   UROBILINOGEN 1.0 01/16/2012 1902   NITRITE NEGATIVE 06/01/2015 1540   LEUKOCYTESUR NEGATIVE 06/01/2015 1540   Sepsis Labs: @LABRCNTIP (procalcitonin:4,lacticidven:4)  ) Recent Results (from the past 240 hour(s))  Culture, blood (Routine X 2) w Reflex to ID Panel     Status: None   Collection Time: 06/12/15  8:58 PM  Result Value Ref Range Status   Specimen Description RIGHT ANTECUBITAL  Final   Special Requests IN BOTH AEROBIC  AND ANAEROBIC BOTTLES 5CC  Final   Culture NO GROWTH 5 DAYS  Final   Report Status 06/17/2015 FINAL  Final  Culture, blood (Routine X 2) w Reflex to ID Panel     Status: None   Collection Time: 06/12/15  9:07 PM  Result Value Ref Range Status   Specimen Description RIGHT ANTECUBITAL  Final   Special Requests IN BOTH AEROBIC AND ANAEROBIC BOTTLES 5CC  Final   Culture NO GROWTH 5 DAYS  Final   Report Status 06/17/2015 FINAL  Final  Blood culture (routine x 2)     Status: None (Preliminary result)   Collection Time: 06/16/15 10:11 PM  Result Value Ref Range Status   Specimen Description BLOOD RIGHT ANTECUBITAL  Final   Special Requests BOTTLES DRAWN AEROBIC AND ANAEROBIC 5CC   Final   Culture NO GROWTH 4 DAYS  Final   Report Status PENDING  Incomplete  Blood culture (routine x 2)     Status: None (Preliminary result)  Collection Time: 06/16/15 10:30 PM  Result Value Ref Range Status   Specimen Description BLOOD RIGHT HAND  Final   Special Requests IN PEDIATRIC BOTTLE 1CC  Final   Culture NO GROWTH 4 DAYS  Final   Report Status PENDING  Incomplete      Radiology Studies: Ct Abdomen Pelvis Wo Contrast  06/20/2015  CLINICAL DATA:  70 year old male with history of fever of unknown origin. EXAM: CT CHEST, ABDOMEN AND PELVIS WITHOUT CONTRAST TECHNIQUE: Multidetector CT imaging of the chest, abdomen and pelvis was performed following the standard protocol without IV contrast. COMPARISON:  No priors. FINDINGS: CT CHEST FINDINGS Mediastinum/Lymph Nodes: Heart size is normal. There is no significant pericardial fluid, thickening or pericardial calcification. There is atherosclerosis of the thoracic aorta, the great vessels of the mediastinum and the coronary arteries, including calcified atherosclerotic plaque in the left main, left anterior descending, left circumflex and right coronary arteries. No pathologically enlarged mediastinal or hilar lymph nodes. Please note that accurate exclusion of  hilar adenopathy is limited on noncontrast CT scans. Esophagus is dilated in contains a large amount of fluid and/or retained solid food. The possibility of esophageal mass is not entirely excluded. No axillary lymphadenopathy. Left-sided internal jugular single-lumen porta cath with tip terminating at the superior cavoatrial junction. Right-sided internal jugular dialysis catheter with tip terminating in the right atrium. Lungs/Pleura: No suspicious appearing pulmonary nodules or masses. No acute consolidative airspace disease. No pleural effusions. Minimal dependent subsegmental atelectasis in the lower lobes of the lungs bilaterally. Musculoskeletal/Soft Tissues: There multiple lytic lesions noted throughout the visualized axial and appendicular skeleton, presumably related to the patient's multiple myeloma. The largest of these is in the T6 vertebral body, where there is what appears to be a chronic compression fracture with approximately 40% loss of anterior and 30% loss of posterior vertebral body height. CT ABDOMEN AND PELVIS FINDINGS Hepatobiliary: Multiple small subcentimeter low-attenuation hepatic lesions are incompletely characterized on today's noncontrast CT examination, but are statistically likely to represent cysts. Unenhanced appearance of the gallbladder is remarkable for some dependent amorphous intermediate attenuation material which likely represents some biliary sludge. No findings to suggest an acute cholecystitis at this time. Pancreas: No pancreatic mass or peripancreatic inflammatory changes identified on today's noncontrast CT examination. Spleen: Unremarkable. Adrenals/Urinary Tract: 1.2 cm high attenuation lesion in the interpolar region of the right kidney is incompletely characterize, but likely to represent a proteinaceous/hemorrhagic cyst. Other low-attenuation lesions in the interpolar and lower pole region of the right kidney are also incompletely characterized, but favored to  represent cysts. Unenhanced appearance of the left kidney is normal. Bilateral adrenal glands are normal in appearance. No hydroureteronephrosis. Urinary bladder is nearly completely decompressed, but otherwise unremarkable in appearance. Stomach/Bowel: The unenhanced appearance of the stomach is normal. There is no pathologic dilatation of small bowel or colon. Normal appendix. Vascular/Lymphatic: Atherosclerosis throughout the abdominal and pelvic vasculature, without evidence of aneurysm. Circumaortic left renal vein (normal anatomical variant) incidentally noted. No lymphadenopathy noted in the abdomen or pelvis on today's noncontrast CT examination. Reproductive: Prostate gland and seminal vesicles are unremarkable in appearance. Other: No significant volume of ascites.  No pneumoperitoneum. Musculoskeletal: Innumerable lytic lesions are noted throughout the visualized axial and appendicular skeleton, compatible with widespread involvement from patient's known multiple myeloma. The largest of these is in the left side of the sacrum measuring approximately 3.6 x 2.7 cm (image 239 of series 7). IMPRESSION: 1. No definite source for fever of unknown origin identified on today's examination.  2. Widespread osseous involvement from the patient's known multiple myeloma, with what appears to be a chronic compression fracture at T6, as above. 3. Markedly dilated esophagus which contains fluid and debris. The possibility of an underlying esophageal mass in this region is not excluded, and correlation with nonemergent esophagram is suggested in the near future to better evaluate these findings. 4. Atherosclerosis, including left main and 3 vessel coronary artery disease. Please note that although the presence of coronary artery calcium documents the presence of coronary artery disease, the severity of this disease and any potential stenosis cannot be assessed on this non-gated CT examination. Assessment for potential risk  factor modification, dietary therapy or pharmacologic therapy may be warranted, if clinically indicated. 5. Additional incidental findings, as above. Electronically Signed   By: Vinnie Langton M.D.   On: 06/20/2015 08:27   Ct Chest Wo Contrast  06/20/2015  CLINICAL DATA:  70 year old male with history of fever of unknown origin. EXAM: CT CHEST, ABDOMEN AND PELVIS WITHOUT CONTRAST TECHNIQUE: Multidetector CT imaging of the chest, abdomen and pelvis was performed following the standard protocol without IV contrast. COMPARISON:  No priors. FINDINGS: CT CHEST FINDINGS Mediastinum/Lymph Nodes: Heart size is normal. There is no significant pericardial fluid, thickening or pericardial calcification. There is atherosclerosis of the thoracic aorta, the great vessels of the mediastinum and the coronary arteries, including calcified atherosclerotic plaque in the left main, left anterior descending, left circumflex and right coronary arteries. No pathologically enlarged mediastinal or hilar lymph nodes. Please note that accurate exclusion of hilar adenopathy is limited on noncontrast CT scans. Esophagus is dilated in contains a large amount of fluid and/or retained solid food. The possibility of esophageal mass is not entirely excluded. No axillary lymphadenopathy. Left-sided internal jugular single-lumen porta cath with tip terminating at the superior cavoatrial junction. Right-sided internal jugular dialysis catheter with tip terminating in the right atrium. Lungs/Pleura: No suspicious appearing pulmonary nodules or masses. No acute consolidative airspace disease. No pleural effusions. Minimal dependent subsegmental atelectasis in the lower lobes of the lungs bilaterally. Musculoskeletal/Soft Tissues: There multiple lytic lesions noted throughout the visualized axial and appendicular skeleton, presumably related to the patient's multiple myeloma. The largest of these is in the T6 vertebral body, where there is what  appears to be a chronic compression fracture with approximately 40% loss of anterior and 30% loss of posterior vertebral body height. CT ABDOMEN AND PELVIS FINDINGS Hepatobiliary: Multiple small subcentimeter low-attenuation hepatic lesions are incompletely characterized on today's noncontrast CT examination, but are statistically likely to represent cysts. Unenhanced appearance of the gallbladder is remarkable for some dependent amorphous intermediate attenuation material which likely represents some biliary sludge. No findings to suggest an acute cholecystitis at this time. Pancreas: No pancreatic mass or peripancreatic inflammatory changes identified on today's noncontrast CT examination. Spleen: Unremarkable. Adrenals/Urinary Tract: 1.2 cm high attenuation lesion in the interpolar region of the right kidney is incompletely characterize, but likely to represent a proteinaceous/hemorrhagic cyst. Other low-attenuation lesions in the interpolar and lower pole region of the right kidney are also incompletely characterized, but favored to represent cysts. Unenhanced appearance of the left kidney is normal. Bilateral adrenal glands are normal in appearance. No hydroureteronephrosis. Urinary bladder is nearly completely decompressed, but otherwise unremarkable in appearance. Stomach/Bowel: The unenhanced appearance of the stomach is normal. There is no pathologic dilatation of small bowel or colon. Normal appendix. Vascular/Lymphatic: Atherosclerosis throughout the abdominal and pelvic vasculature, without evidence of aneurysm. Circumaortic left renal vein (normal anatomical variant) incidentally noted.  No lymphadenopathy noted in the abdomen or pelvis on today's noncontrast CT examination. Reproductive: Prostate gland and seminal vesicles are unremarkable in appearance. Other: No significant volume of ascites.  No pneumoperitoneum. Musculoskeletal: Innumerable lytic lesions are noted throughout the visualized axial and  appendicular skeleton, compatible with widespread involvement from patient's known multiple myeloma. The largest of these is in the left side of the sacrum measuring approximately 3.6 x 2.7 cm (image 239 of series 7). IMPRESSION: 1. No definite source for fever of unknown origin identified on today's examination. 2. Widespread osseous involvement from the patient's known multiple myeloma, with what appears to be a chronic compression fracture at T6, as above. 3. Markedly dilated esophagus which contains fluid and debris. The possibility of an underlying esophageal mass in this region is not excluded, and correlation with nonemergent esophagram is suggested in the near future to better evaluate these findings. 4. Atherosclerosis, including left main and 3 vessel coronary artery disease. Please note that although the presence of coronary artery calcium documents the presence of coronary artery disease, the severity of this disease and any potential stenosis cannot be assessed on this non-gated CT examination. Assessment for potential risk factor modification, dietary therapy or pharmacologic therapy may be warranted, if clinically indicated. 5. Additional incidental findings, as above. Electronically Signed   By: Vinnie Langton M.D.   On: 06/20/2015 08:27     Scheduled Meds: . sodium chloride   Intravenous Once  . acetaminophen  650 mg Oral Once  . ampicillin-sulbactam (UNASYN) IV  1.5 g Intravenous Q12H  . calcitRIOL  0.25 mcg Oral Q T,Th,Sa-HD  . darbepoetin (ARANESP) injection - DIALYSIS  100 mcg Intravenous Q Tue-HD  . feeding supplement (ENSURE ENLIVE)  237 mL Oral BID BM  . insulin aspart  0-9 Units Subcutaneous Q4H  . metoCLOPramide (REGLAN) injection  5 mg Intravenous Q6H  . metoprolol  2.5 mg Intravenous Q6H  . phytonadione  5 mg Subcutaneous Once  . sodium chloride flush  3 mL Intravenous Q12H  . sodium chloride flush  3 mL Intravenous Q12H   Continuous Infusions: . pantoprozole  (PROTONIX) infusion 8 mg/hr (06/21/15 1158)     LOS: 5 days    Time spent: 30 min    Janece Canterbury, MD [facility] Pager [phone]  If 7PM-7AM, please contact night-coverage [email or web address] Password [email or web address] 06/21/2015, 2:19 PM

## 2015-06-21 NOTE — Anesthesia Preprocedure Evaluation (Addendum)
Anesthesia Evaluation  Patient identified by MRN, date of birth, ID band Patient awake    Reviewed: Allergy & Precautions, NPO status , Patient's Chart, lab work & pertinent test results  Airway Mallampati: III  TM Distance: >3 FB Neck ROM: Full    Dental  (+) Dental Advisory Given   Pulmonary neg pulmonary ROS,    breath sounds clear to auscultation       Cardiovascular hypertension, Pt. on medications and Pt. on home beta blockers  Rhythm:Regular Rate:Normal  05/2015: Left ventricle: The cavity size was normal. Wall thickness was  normal. Systolic function was normal. The estimated ejection  fraction was in the range of 55% to 65%. Features are consistent  with a pseudonormal left ventricular filling pattern, with  concomitant abnormal relaxation and increased filling pressure  (grade 2 diastolic dysfunction). - Mitral valve: There was moderate regurgitation. - Left atrium: The atrium was severely dilated. - Tricuspid valve: There was moderate regurgitation. - Pulmonary arteries: Systolic pressure was moderately increased.  PA peak pressure: 49 mm Hg (S).   Neuro/Psych negative neurological ROS     GI/Hepatic Neg liver ROS, PUD,   Endo/Other  diabetes, Type 2, Oral Hypoglycemic Agents  Renal/GU ARF and CRFRenal disease     Musculoskeletal   Abdominal   Peds  Hematology  (+) anemia , Pancytopenia   Anesthesia Other Findings   Reproductive/Obstetrics                            Lab Results  Component Value Date   WBC 3.4* 06/21/2015   HGB 8.0* 06/21/2015   HCT 24.9* 06/21/2015   MCV 80.6 06/21/2015   PLT 83* 06/21/2015   Lab Results  Component Value Date   CREATININE 8.41* 06/21/2015   BUN 43* 06/21/2015   NA 139 06/21/2015   K 4.3 06/21/2015   CL 100* 06/21/2015   CO2 22 06/21/2015    Anesthesia Physical Anesthesia Plan  ASA: III  Anesthesia Plan: MAC   Post-op  Pain Management:    Induction: Intravenous  Airway Management Planned: Natural Airway and Nasal Cannula  Additional Equipment:   Intra-op Plan:   Post-operative Plan:   Informed Consent: I have reviewed the patients History and Physical, chart, labs and discussed the procedure including the risks, benefits and alternatives for the proposed anesthesia with the patient or authorized representative who has indicated his/her understanding and acceptance.     Plan Discussed with: CRNA  Anesthesia Plan Comments:         Anesthesia Quick Evaluation

## 2015-06-21 NOTE — Progress Notes (Signed)
[facility] Progress Note  Assessment/Plan: 1. SIRS - Tmax 99.4 - down some ; he also had fever spike 5/23 to 101.5 CXR neg BC were neg - no abx given during that event; now on empiric Vanc and Zosyn without improvement in fevers- changed to Augmentin and only daily Axyclorvir.  [facility] 5/27 pending 2. ESRD - TTS - HD Thursday no heparin  3. Anemia - hgb 7.2 5/30 transfused 2 unit PRBC up to 8.9 drifting down to 8 5/31(also had 2 units last week during prior admission) Aranesp 100 q Tuesday- continue to transfuse prn 4. Secondary hyperparathyroidism - calcitriol 0.25 q HD; renvela 800 1 ac - started last admission P low  1.7- have d/c binder - pt not eating; no on CL 5. HTN/volume - CXR neg for volume - BP up Tuesday - net UF 2.6 BP lower Wed - not eating - plan minimal if any removal Thursday 6. Nutrition - alb 2.5 added Resource - ok for CL per GI 7. MM - refractory to multiple regimens - still getting Velcade/daratumumaby at [facility] and hx SCT 2012- 8. Thrombocytopenia -plts 69-80s - follow 9. Hiccups - thorazine/reglan 10. DM - per primary 11.Hematemesis secondary to hemorrhagic grade D reflex esophagitis - mild distal esoph stricture - no dilitation due to oozing - continue CL - IV PPI no anticoag ,- bx done  on diflucan (known recent fungal esophagitis)  11. DNR  Eddie Jacobson, PA-C [place] [phone] 06/21/2015,9:47 AM  LOS: 5 days   Pt seen, examined, agree w assess/plan as above with additions as indicated.  Eddie Splinter MD [facility] pager [phone]    cell [phone] 06/21/2015, 1:55 PM     Subjective:   C/o gagging some since procedure; spitting up small amounts blood/blood tinged sputum; some esophogeal discomfort  Objective Filed Vitals:   06/21/15 0002 06/21/15 0406 06/21/15 0752 06/21/15 0832  BP: 140/75 147/76 143/65 175/72  Pulse: 101 102 89 91  Temp: 98.4 F (36.9 C) 98.2 F (36.8 C) 98.6 F (37  C) 98.3 F (36.8 C)  TempSrc:   Oral Oral  Resp: 21 20 20 20   Height:    5\' 11"  (1.803 m)  Weight:    60.782 kg (134 lb)  SpO2: 100% 100% 100% 100%   Physical Exam General: ill appearing sitting in chair;  Heart:  RRR Lungs: no rales Abdomen: soft  Extremities: no edema Dialysis Access: right IJ - left upper AVF + bruit  Dialysis Orders: Tuesday/Thursday/Saturday, 4 hours, 180 dialyzer, blood flow rate 400, dialysate flow 800, EDW 62.5 kg, 3K/2.25 calcium, no UF profile, no sodium modeling. No heparin. Hectorol 1 g 3 times a week, Mircera 200 g IV every 2 weeks. Tunneled RIJ dialysis catheter with maturing left BCF. (ONLY had one outpt HD prior to hospital readmission  Additional Objective Labs: Basic Metabolic Panel:  Recent Labs Lab 06/17/15 0245 06/19/15 1006 06/21/15 0450  NA 134* 135 139  K 4.7 3.8 4.3  CL 97* 96* 100*  CO2 26 26 22   GLUCOSE 141* 120* 124*  BUN 20 13 43*  CREATININE 4.69* 3.70* 8.41*  CALCIUM 7.8* 8.0* 8.2*  PHOS  --  1.7*  --    Liver Function Tests:  Recent Labs Lab 06/19/15 1006  ALBUMIN 2.2*   CBC:  Recent Labs Lab 06/16/15 1934 06/17/15 0245 06/19/15 0412 06/20/15 0430 06/21/15 0450  WBC 4.5 4.2 3.5* 3.7* 3.4*  NEUTROABS  --   --  3.0  --   --   HGB 8.6* 7.9* 7.2* 8.9* 8.0*  HCT 27.1* 25.1* 22.9* 28.3* 24.9*  MCV 83.1 83.7 82.1 80.9 80.6  PLT 89* 72* 73* 69* 83*   Blood Culture    Component Value Date/Time   SDES BLOOD RIGHT HAND 06/16/2015 2230   SPECREQUEST IN PEDIATRIC BOTTLE 1CC 06/16/2015 2230   CULT NO GROWTH 4 DAYS 06/16/2015 2230   REPTSTATUS PENDING 06/16/2015 2230    Cardiac Enzymes:  Recent Labs Lab 06/15/15 0520 06/15/15 1026 06/18/15 1840 06/18/15 2315 06/19/15 0412  TROPONINI 0.04* 0.03 0.04* 0.03 0.05*   CBG:  Recent Labs Lab 06/20/15 1700 06/20/15 1937 06/21/15 0001 06/21/15 0405 06/21/15 0751  GLUCAP 147* 164* 129* 132* 131*    Medications:   . [MAR Hold] sodium chloride    Intravenous Once  . [MAR Hold] acetaminophen  650 mg Oral Once  . [MAR Hold] ampicillin-sulbactam (UNASYN) IV  1.5 g Intravenous Q12H  . [MAR Hold] calcitRIOL  0.25 mcg Oral Q T,Th,Sa-HD  . [MAR Hold] darbepoetin (ARANESP) injection - DIALYSIS  100 mcg Intravenous Q Tue-HD  . [MAR Hold] feeding supplement (ENSURE ENLIVE)  237 mL Oral BID BM  . [MAR Hold] insulin aspart  0-9 Units Subcutaneous Q4H  . [MAR Hold] metoCLOPramide (REGLAN) injection  5 mg Intravenous Q6H  . [MAR Hold] metoprolol  2.5 mg Intravenous Q6H  . [MAR Hold] pantoprazole (PROTONIX) IV  40 mg Intravenous Q12H  . [MAR Hold] sodium chloride flush  3 mL Intravenous Q12H  . [MAR Hold] sodium chloride flush  3 mL Intravenous Q12H

## 2015-06-22 ENCOUNTER — Ambulatory Visit: Payer: Medicare Other

## 2015-06-22 ENCOUNTER — Encounter (HOSPITAL_COMMUNITY): Payer: Self-pay | Admitting: Gastroenterology

## 2015-06-22 DIAGNOSIS — Z515 Encounter for palliative care: Secondary | ICD-10-CM | POA: Insufficient documentation

## 2015-06-22 DIAGNOSIS — K922 Gastrointestinal hemorrhage, unspecified: Secondary | ICD-10-CM

## 2015-06-22 DIAGNOSIS — K208 Other esophagitis: Secondary | ICD-10-CM

## 2015-06-22 LAB — BASIC METABOLIC PANEL
Anion gap: 13 (ref 5–15)
BUN: 23 mg/dL — AB (ref 6–20)
CALCIUM: 8.2 mg/dL — AB (ref 8.9–10.3)
CHLORIDE: 100 mmol/L — AB (ref 101–111)
CO2: 27 mmol/L (ref 22–32)
CREATININE: 4.36 mg/dL — AB (ref 0.61–1.24)
GFR calc non Af Amer: 13 mL/min — ABNORMAL LOW (ref >60)
GFR, EST AFRICAN AMERICAN: 15 mL/min — AB (ref >60)
Glucose, Bld: 122 mg/dL — ABNORMAL HIGH (ref 65–99)
Potassium: 3.9 mmol/L (ref 3.5–5.1)
SODIUM: 140 mmol/L (ref 135–145)

## 2015-06-22 LAB — CBC
HCT: 22.3 % — ABNORMAL LOW (ref 39.0–52.0)
Hemoglobin: 7 g/dL — ABNORMAL LOW (ref 13.0–17.0)
MCH: 25.5 pg — AB (ref 26.0–34.0)
MCHC: 31.4 g/dL (ref 30.0–36.0)
MCV: 81.4 fL (ref 78.0–100.0)
PLATELETS: 67 10*3/uL — AB (ref 150–400)
RBC: 2.74 MIL/uL — AB (ref 4.22–5.81)
RDW: 16.8 % — AB (ref 11.5–15.5)
WBC: 2.8 10*3/uL — ABNORMAL LOW (ref 4.0–10.5)

## 2015-06-22 LAB — GLUCOSE, CAPILLARY
GLUCOSE-CAPILLARY: 116 mg/dL — AB (ref 65–99)
GLUCOSE-CAPILLARY: 144 mg/dL — AB (ref 65–99)
GLUCOSE-CAPILLARY: 185 mg/dL — AB (ref 65–99)
GLUCOSE-CAPILLARY: 225 mg/dL — AB (ref 65–99)
GLUCOSE-CAPILLARY: 242 mg/dL — AB (ref 65–99)
Glucose-Capillary: 124 mg/dL — ABNORMAL HIGH (ref 65–99)

## 2015-06-22 LAB — PREPARE RBC (CROSSMATCH)

## 2015-06-22 MED ORDER — SODIUM CHLORIDE 0.9 % IV SOLN
8.0000 mg | Freq: Four times a day (QID) | INTRAVENOUS | Status: DC
  Administered 2015-06-22 – 2015-06-28 (×24): 8 mg via INTRAVENOUS
  Filled 2015-06-22 (×29): qty 4

## 2015-06-22 MED ORDER — ENSURE ENLIVE PO LIQD
237.0000 mL | Freq: Three times a day (TID) | ORAL | Status: DC
  Administered 2015-06-25 – 2015-06-28 (×4): 237 mL via ORAL

## 2015-06-22 MED ORDER — SODIUM CHLORIDE 0.9 % IV SOLN
Freq: Once | INTRAVENOUS | Status: AC
  Administered 2015-06-22: 14:00:00 via INTRAVENOUS

## 2015-06-22 MED ORDER — BACLOFEN 1 MG/ML ORAL SUSPENSION
10.0000 mg | Freq: Three times a day (TID) | ORAL | Status: DC
  Administered 2015-06-22 – 2015-06-23 (×2): 10 mg via ORAL
  Filled 2015-06-22 (×4): qty 1

## 2015-06-22 NOTE — Progress Notes (Signed)
Staffed with Dr. Sheran Fava and Dr. Julien Nordmann. Recommend [place] discussion in am 06/23/15 to allow time to absorb difficult discussion. Will add baclofen suspension 10mg  TID for hiccoughs and schedule Zofran for vomiting. Dr. Sheran Fava in agreement with plan. Romona Curls, ANP-ACHPN

## 2015-06-22 NOTE — Progress Notes (Signed)
PROGRESS NOTE  Eddie Thomas  TMY:[id number] DOB: Jun 27, 1945 DOA: 06/16/2015 PCP: Eddie Thomas  Brief Narrative:  70 y.o. male with medical history significant of multiple myeloma, IgA subtype, candidal esophagitis with stricture dilated in late April by Dr. Fuller Thomas, ESRD, hypertension, diabetes mellitus.  He was recently hospitalized in Mid-May for acute renal failure likely secondary to progressive multiple myeloma.  He was started on dialysis and deemed ESRD.  He was discharged, but represented a few days later with fever, nausea and vomiting.  He has had progressive pancytopenia with worsening myeloma markers.  He developed hiccoughs and chest pain with hemoptysis.  He has had recurrent fevers Summary of previous hospitalization--Intractable hiccups- unclear etiology. ABLA complicating chronic anemia > multiple antibodies on type and screen- Thomas for 2 PRBC's on 5/23. Acute AMS developed on 06/12/15 which was likely multifactorial including medications and possible new infection as pt had a fever up to 101.6. Since then, his mental status has improved and returned back to baseline. On 06/14/15, pt c/o of cp and sob. With his hx of DVT, V/QScan was obtained and it was negative. In addition, the patient had coffee grounds emesis during hospitalization. He was evaluated by Eddie Thomas. They did not feel the patient needed an additional EGD at this time as he has had recent EGD on 05/16/2015. His PPI was increased to twice a day, and Reglan was added.  Started vomiting maroon colored blood on 5/30 with inability to tolerate PO.  EGD demonstrated grade D hemorrhagic esophagitis with clot and oozing.  Assessment & Thomas:   Principal Problem:   SIRS (systemic inflammatory response syndrome) (HCC) Active Problems:   Multiple myeloma (HCC)   Diabetes mellitus (HCC)   Antineoplastic chemotherapy induced anemia   Hypoalbuminemia due to protein-calorie malnutrition (HCC)   Generalized  weakness   ESRD on hemodialysis (HCC)   Nausea and vomiting   Aspiration pneumonitis (HCC)   Pyrexia   Protein-calorie malnutrition, severe   FUO (fever of unknown origin)   Hematemesis with nausea   Dysphagia, pharyngoesophageal phase  SIRS/Fever, suspect aspiration pneumonitis given his frequent hematemesis and partially obstructed esophagus vs neoplastic fever from widespread myeloma.   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  Prognosis is poor. - TDC and AVF placed by Eddie Thomas -  Outpatient TTS dialysis from Eddie Thomas  Intractable hiccups, intermittent vomiting and coffee grounds emesis due to grade D reflux esophagitis with stricture and retained esophageal debris/fluid.  Likely result of malnutrition, debilitation and remaining supine in bed - Eddie Thomas assistance appreciated - EGD on 6/1 found hemorrhagic esophagitis, biopsies obtained - advance to full liquid diet - continue IV PPI  -  Palliative care consult for symptom management  Multiple Myeloma, progressing with worsening anemia now requiring more frequent blood transfusions, worsening malnutrition, rising B2 microglobulin, multiple lytic lesions - refractory to multiple chemo regimens and SCT in 2012 - Appreciate Oncology assistance > Eddie Thomas had family meeting today with patient, wife, and daughter Eddie Thomas and recommended hospice care - Palliative care consult  Acute encephalopathy, resolved likely multifactorial including infection, meds, worsen anemia -developed between 5/22 night and 5/23 AM -06/12/15 CT brain negative -06/13/15--mental status improving -ammonia--24,  -B12--266 -TSH--0.493, Free T4--1.04 -with B12 in low normal range-->started supplementation -Discontinued Thorazine, baclofen,  opioids, gabapentin, other hypnotics -Mental status presently  at baseline and may resume above medications as needed  Atypical Chest Pain, likely due to esophageal stricture and dilation -developed 06/14/15 -VQ scan neg -EKG-no concerning ischemic changes -troponins minimally elevated due to ESRD  Anemia of neoplastic disease and acute blood loss.  Multiple antibodies related to his myeloma medications make finding compatible blood difficult - transfused 2 units PRBC earlier on 06/04/15. - Transfused 2 units of PRBC 5/23 - 2 units PRBC on 5/30 - 1 unit PRBC on 6/1 -  Requested blood bank to obtain two more matched units  Pancytopenia - Secondary to multiple myeloma and related cancer chemotherapy.  NSVT: - Patient had 15 beats of nonsustained V. tach, hypomagnesemia corrected,  - 2-D echo: Normal EF. - continue IV metoprolol for now  History of DVT - Xarelto DC'ed given renal function (took Xarelto approximately one year) - Not good anticoagulation candidate due to bleeding complications.  -  Eddie Thomas aware  Essential hypertension - ACEI stopped due to acute renal failure.  - continue IV metoprolol for now  Diabetes mellitus type 2 -CBGs remained largely controlled -novolog sliding scale for now  Severe protein calorie malnutrition - advance diet - continue supplements  DVT prophylaxis:  heparin Code Status:  DNR Family Communication:  Patient, daughter Eddie Thomas at bedside Disposition Thomas:  Recommending hospice care.  Unlikely that he will recover from his esophagitis, stop hemorrhaging, dramatically improve his nutrition and be able to resume any form of chemotherapy, especially given his ESRD.    Consultants:   Thomas  Nephrology  Vascular surgery  Oncology  Palliative care  Procedures:  none  Antimicrobials:   Zosyn   Augmentin  Unasyn    Subjective: still vomiting blood and having hiccoughs.  Persistent chest discomfort  Objective: Filed  Vitals:   06/22/15 0743 06/22/15 1255 06/22/15 1320 06/22/15 1548  BP: 146/64 162/65 154/72 153/66  Pulse: 103 95 97 97  Temp: 98 F (36.7 C) 99.1 F (37.3 C) 99.1 F (37.3 C) 99.1 F (37.3 C)  TempSrc: Oral Oral Axillary Axillary  Resp: 18 18 18 16   Height:      Weight:      SpO2: 100% 100% 100% 100%    Intake/Output Summary (Last 24 hours) at 06/22/15 1639 Last data filed at 06/22/15 1548  Gross per 24 hour  Intake 1411.67 ml  Output    100 ml  Net 1311.67 ml   Filed Weights   06/20/15 1938 06/21/15 0832 06/21/15 1330  Weight: 60.963 kg (134 lb 6.4 oz) 60.782 kg (134 lb) 60.8 kg (134 lb 0.6 oz)    Examination:  General exam:  Cachectic adult male.  No acute distress.  HEENT:  NCAT, MMM Respiratory system:  Rales at the bilateral bases Cardiovascular system: Tachycardic, regular rhythm, normal S1/S2. No murmurs, rubs, gallops or clicks.  Warm extremities Gastrointestinal system: Normal active bowel sounds, soft, nondistended, nontender MSK:  Normal tone and bulk, no lower extremity edema Neuro:  Grossly intact    Data Reviewed: I have personally reviewed following labs and imaging studies  CBC:  Recent Labs Lab 06/17/15 0245 06/19/15 0412 06/20/15 0430 06/21/15 0450 06/22/15 0520  WBC 4.2 3.5* 3.7* 3.4* 2.8*  NEUTROABS  --  3.0  --   --   --   HGB 7.9* 7.2* 8.9* 8.0* 7.0*  HCT 25.1* 22.9* 28.3* 24.9* 22.3*  MCV 83.7 82.1 80.9 80.6 81.4  PLT 72* 73* 69* 83* 67*   Basic Metabolic Panel:  Recent Labs Lab 06/16/15 1934  06/17/15 0245 06/19/15 1006 06/21/15 0450 06/22/15 0520  NA 133* 134* 135 139 140  K 4.5 4.7 3.8 4.3 3.9  CL 95* 97* 96* 100* 100*  CO2 24 26 26 22 27   GLUCOSE 179* 141* 120* 124* 122*  BUN 15 20 13  43* 23*  CREATININE 3.77* 4.69* 3.70* 8.41* 4.36*  CALCIUM 8.1* 7.8* 8.0* 8.2* 8.2*  PHOS  --   --  1.7*  --   --    GFR: Estimated Creatinine Clearance: 13.8 mL/min (by C-G formula based on Cr of 4.36). Liver Function  Tests:  Recent Labs Lab 06/19/15 1006  ALBUMIN 2.2*   No results for input(s): LIPASE, AMYLASE in the last 168 hours. No results for input(s): AMMONIA in the last 168 hours. Coagulation Profile:  Recent Labs Lab 06/21/15 1150  INR 1.36   Cardiac Enzymes:  Recent Labs Lab 06/18/15 1840 06/18/15 2315 06/19/15 0412  TROPONINI 0.04* 0.03 0.05*   BNP (last 3 results) No results for input(s): PROBNP in the last 8760 hours. HbA1C: No results for input(s): HGBA1C in the last 72 hours. CBG:  Recent Labs Lab 06/21/15 1944 06/22/15 0004 06/22/15 0408 06/22/15 0749 06/22/15 1158  GLUCAP 152* 116* 124* 144* 185*   Lipid Profile: No results for input(s): CHOL, HDL, LDLCALC, TRIG, CHOLHDL, LDLDIRECT in the last 72 hours. Thyroid Function Tests: No results for input(s): TSH, T4TOTAL, FREET4, T3FREE, THYROIDAB in the last 72 hours. Anemia Panel: No results for input(s): VITAMINB12, FOLATE, FERRITIN, TIBC, IRON, RETICCTPCT in the last 72 hours. Urine analysis:    Component Value Date/Time   COLORURINE YELLOW 06/01/2015 [place] 06/01/2015 1540   LABSPEC 1.008 06/01/2015 1540   PHURINE 6.0 06/01/2015 1540   GLUCOSEU NEGATIVE 06/01/2015 1540   HGBUR TRACE* 06/01/2015 1540   BILIRUBINUR NEGATIVE 06/01/2015 1540   KETONESUR NEGATIVE 06/01/2015 1540   PROTEINUR 30* 06/01/2015 1540   UROBILINOGEN 1.0 01/16/2012 1902   NITRITE NEGATIVE 06/01/2015 1540   LEUKOCYTESUR NEGATIVE 06/01/2015 1540   Sepsis Labs: @LABRCNTIP (procalcitonin:4,lacticidven:4)  ) Recent Results (from the past 240 hour(s))  Culture, blood (Routine X 2) w Reflex to ID Panel     Status: None   Collection Time: 06/12/15  8:58 PM  Result Value Ref Range Status   Specimen Description RIGHT ANTECUBITAL  Final   Special Requests IN BOTH AEROBIC AND ANAEROBIC BOTTLES 5CC  Final   Culture NO GROWTH 5 DAYS  Final   Report Status 06/17/2015 FINAL  Final  Culture, blood (Routine X 2) w Reflex  to ID Panel     Status: None   Collection Time: 06/12/15  9:07 PM  Result Value Ref Range Status   Specimen Description RIGHT ANTECUBITAL  Final   Special Requests IN BOTH AEROBIC AND ANAEROBIC BOTTLES 5CC  Final   Culture NO GROWTH 5 DAYS  Final   Report Status 06/17/2015 FINAL  Final  Blood culture (routine x 2)     Status: None   Collection Time: 06/16/15 10:11 PM  Result Value Ref Range Status   Specimen Description BLOOD RIGHT ANTECUBITAL  Final   Special Requests BOTTLES DRAWN AEROBIC AND ANAEROBIC 5CC   Final   Culture NO GROWTH 5 DAYS  Final   Report Status 06/21/2015 FINAL  Final  Blood culture (routine x 2)     Status: None   Collection Time: 06/16/15 10:30 PM  Result Value Ref Range Status   Specimen Description BLOOD RIGHT HAND  Final   Special Requests IN PEDIATRIC  BOTTLE 1CC  Final   Culture NO GROWTH 5 DAYS  Final   Report Status 06/21/2015 FINAL  Final      Radiology Studies: No results found.   Scheduled Meds: . sodium chloride   Intravenous Once  . acetaminophen  650 mg Oral Once  . ampicillin-sulbactam (UNASYN) IV  1.5 g Intravenous Q12H  . baclofen  10 mg Oral TID  . calcitRIOL  0.25 mcg Oral Q T,Th,Sa-HD  . darbepoetin (ARANESP) injection - DIALYSIS  100 mcg Intravenous Q Tue-HD  . feeding supplement (ENSURE ENLIVE)  237 mL Oral TID BM  . insulin aspart  0-9 Units Subcutaneous Q4H  . metoCLOPramide (REGLAN) injection  5 mg Intravenous Q6H  . metoprolol  2.5 mg Intravenous Q6H  . ondansetron (ZOFRAN) IV  8 mg Intravenous Q6H  . sodium chloride flush  3 mL Intravenous Q12H  . sodium chloride flush  3 mL Intravenous Q12H   Continuous Infusions: . pantoprozole (PROTONIX) infusion 8 mg/hr (06/21/15 2122)     LOS: 6 days    Time spent: 30 min    Janece Canterbury, MD [facility] Pager [phone]  If 7PM-7AM, please contact night-coverage [email or web address] Password [facility] 06/22/2015, 4:39 PM

## 2015-06-22 NOTE — Care Management Important Message (Signed)
Important Message  Patient Details  Name: Eddie Thomas MRN: [id number] Date of Birth: April 12, 1945   Medicare Important Message Given:  Yes    Loann Quill 06/22/2015, 11:07 AM

## 2015-06-22 NOTE — Progress Notes (Signed)
Pharmacy Antibiotic Note Eddie Thomas is a 70 y.o. male admitted on 06/16/2015 with aspiration PNA. He is currently on Unasyn day 3 but has received 7 days of treatment when factoring in Zosyn and Augmentin.   Plan: 1. Continue Unasyn 1.5g IV q12h for now  2. Monitor clinical progress, c/s, LOT  Height: 5\' 11"  (180.3 cm) Weight: 134 lb 0.6 oz (60.8 kg) IBW/kg (Calculated) : 75.3  Temp (24hrs), Avg:98.5 F (36.9 C), Min:97.9 F (36.6 C), Max:99.5 F (37.5 C)   Recent Labs Lab 06/16/15 1934 06/16/15 1955 06/16/15 2230 06/17/15 0005 06/17/15 0245 06/19/15 0412 06/19/15 1006 06/20/15 0430 06/21/15 0450 06/22/15 0520  WBC 4.5  --   --   --  4.2 3.5*  --  3.7* 3.4* 2.8*  CREATININE 3.77*  --   --   --  4.69*  --  3.70*  --  8.41* 4.36*  LATICACIDVEN  --  0.99 0.94 0.6 0.7  --   --   --   --   --     Estimated Creatinine Clearance: 13.8 mL/min (by C-G formula based on Cr of 4.36).    No Known Allergies  Antimicrobials this admission: Vanc 5/27 >>5/29 Zosyn 5/27 >> 5/29 Valtrex 5/28 >>5/31 Fluconazole 5/30 >>5/31 Augmentin 5/30>>5/31  Unasyn 5/31>>  Dose adjustments this admission: n/a  Microbiology results: 5/27 BCx >> ngF   Vincenza Hews, PharmD, BCPS 06/22/2015, 9:30 AM Pager: [phone]

## 2015-06-22 NOTE — Progress Notes (Signed)
CHART NOTE  I saw the patient for a social visit today. His wife and daughter were at the bedside. He is not feeling well and currently receiving PRBCs transfusion. His condition has deteriorated over the last 2 months with frequent admissions over the last few weeks. He has progressive multiple myeloma in addition to end stage renal disease currently on hemodialysis. The patient also has extensive esophageal ulceration with persistent bleeding despite treatment with PPI. I discussed with the patient and his family his current prognosis and treatment options. Unfortunately his options for treatment of the multiple myeloma are limited at this point especially with his current condition and the end stage renal disease. I strongly encouraged the patient to consider palliative care and hospice at this point. The patient and his family are considering this option seriously but they are still thinking about whether to continue hemodialysis or not. I encouraged him to speak to the palliative care team for more details about the palliative care approach. Thank you so much for taking good care of Eddie Thomas. Please let me know if I can help in any way taking care of this nice man. I would also be happy to be the attending of record for his hospice care after discharge.

## 2015-06-22 NOTE — Progress Notes (Signed)
Nutrition Follow-up  DOCUMENTATION CODES:   Severe malnutrition in context of chronic illness  INTERVENTION:  Provide Ensure Enlive po TID, each supplement provides 350 kcal and 20 grams of protein.  Encourage adequate PO intake.   RD to continue to monitor.  NUTRITION DIAGNOSIS:   Malnutrition related to chronic illness as evidenced by severe depletion of body fat, severe depletion of muscle mass, percent weight loss; ongoing  GOAL:   Patient will meet greater than or equal to 90% of their needs; progressing  MONITOR:   PO intake, Supplement acceptance, Diet advancement, Labs, Weight trends, Skin, I & O's  REASON FOR ASSESSMENT:   Malnutrition Screening Tool    ASSESSMENT:   Eddie Thomas is a 70 y.o. 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 Julien Nordmann of medical oncology) who presents to the ED with complaints of Fevers and chills and Nausea and Vomiting since discharge to home yesterday. Pt with ESRD on HD.  Pt vomited maroon colored blood 5/30. EGD done 6/1. Pt with severe esophagitis. Pt is currently on a clear liquid diet. Meal completion 25-50%. Pt's hiccups and regurgitation has improved a bit. Pt currently has Ensure ordered and MD has allowed it while on a clear liquid diet. RD to increase Ensure to TID to aid in caloric and protein needs. Per GI MD, may need consideration of TPN, however pt has been tolerating Ensure. RD to continue to monitor.   Diet Order:  Diet clear liquid Room service appropriate?: Yes; Fluid consistency:: Thin; Fluid restriction:: 1200 mL Fluid  Skin:  Reviewed, no issues  Last BM:  06/14/15  Height:   Ht Readings from Last 1 Encounters:  06/21/15 5' 11"  (1.803 m)    Weight:   Wt Readings from Last 1 Encounters:  06/21/15 134 lb 0.6 oz (60.8 kg)    Ideal Body Weight:  78.2 kg  BMI:  Body mass  index is 18.7 kg/(m^2).  Estimated Nutritional Needs:   Kcal:  2100-2300  Protein:  100-115 grams  Fluid:  1.2 L/day  EDUCATION NEEDS:   Education needs addressed  Corrin Parker, MS, RD, LDN Pager # [phone] After hours/ weekend pager # [phone]

## 2015-06-22 NOTE — Progress Notes (Addendum)
Daily Rounding Note  06/22/2015, 11:59 AM  LOS: 6 days   SUBJECTIVE:       Hiccups and regurge/vomiting a little better.  Says he has always taken his PPI BID.  Prefers to stay on clears.   OBJECTIVE:         Vital signs in last 24 hours:    Temp:  [97.9 F (36.6 C)-99.5 F (37.5 C)] 98 F (36.7 C) (06/02 0743) Pulse Rate:  [94-123] 103 (06/02 0743) Resp:  [13-20] 18 (06/02 0743) BP: (125-158)/(56-95) 146/64 mmHg (06/02 0743) SpO2:  [100 %] 100 % (06/02 0743) Weight:  [60.8 kg (134 lb 0.6 oz)] 60.8 kg (134 lb 0.6 oz) (06/01 1330) Last BM Date: 06/20/15 Filed Weights   06/20/15 1938 06/21/15 0832 06/21/15 1330  Weight: 60.963 kg (134 lb 6.4 oz) 60.782 kg (134 lb) 60.8 kg (134 lb 0.6 oz)   General: looks thin, ill   Heart: RRR Chest: diminished BS.  Wet vocal quality Abdomen: soft, active BS.  NT.  ND  Extremities: no CCE Neuro/Psych:  Pleasant, alert, oriented x 3.  No gross deficits.   Intake/Output from previous day: 06/01 0701 - 06/02 0700 In: 1092.5 [P.O.:240; I.V.:802.5; IV Piggyback:50] Out: 1 [Blood:1]  Intake/Output this shift: Total I/O In: 120 [P.O.:120] Out: 0   Lab Results:  Recent Labs  06/20/15 0430 06/21/15 0450 06/22/15 0520  WBC 3.7* 3.4* 2.8*  HGB 8.9* 8.0* 7.0*  HCT 28.3* 24.9* 22.3*  PLT 69* 83* 67*   BMET  Recent Labs  06/21/15 0450 06/22/15 0520  NA 139 140  K 4.3 3.9  CL 100* 100*  CO2 22 27  GLUCOSE 124* 122*  BUN 43* 23*  CREATININE 8.41* 4.36*  CALCIUM 8.2* 8.2*   LFT No results for input(s): PROT, ALBUMIN, AST, ALT, ALKPHOS, BILITOT, BILIDIR, IBILI in the last 72 hours. PT/INR  Recent Labs  06/21/15 1150  LABPROT 16.9*  INR 1.36    Studies/Results: No results found.   Scheduled Meds: . sodium chloride   Intravenous Once  . sodium chloride   Intravenous Once  . acetaminophen  650 mg Oral Once  . ampicillin-sulbactam (UNASYN) IV  1.5 g Intravenous  Q12H  . calcitRIOL  0.25 mcg Oral Q T,Th,Sa-HD  . darbepoetin (ARANESP) injection - DIALYSIS  100 mcg Intravenous Q Tue-HD  . feeding supplement (ENSURE ENLIVE)  237 mL Oral BID BM  . insulin aspart  0-9 Units Subcutaneous Q4H  . metoCLOPramide (REGLAN) injection  5 mg Intravenous Q6H  . metoprolol  2.5 mg Intravenous Q6H  . sodium chloride flush  3 mL Intravenous Q12H  . sodium chloride flush  3 mL Intravenous Q12H   Continuous Infusions: . pantoprozole (PROTONIX) infusion 8 mg/hr (06/21/15 2122)   PRN Meds:.sodium chloride, acetaminophen **OR** acetaminophen, chlorproMAZINE (THORAZINE) IV, HYDROmorphone (DILAUDID) injection, LORazepam, ondansetron **OR** ondansetron (ZOFRAN) IV, sodium chloride flush, sodium chloride flush.   ASSESMENT:   *   Nausea, vomiting, pinkish bloody emesis, hiccups.   Repeat EGD of 6/1: grade D, hemorrhagic esophagitis with blood clot and oozing in distal esophagus. Mild, distal esophageal stricture and small HH.  Biopsies obtained, path pending.  Appearance with severe esophagitis raised question as to pt's compliance with PPI.     PLAN   *  Continue current supportive care.  Keep on clears.  May need to consider TNA, though he is mostly tolerating Ensure.   Await esoph biopsies.    Azucena Freed  06/22/2015, 11:59 AM Pager: [phone]     Attending physician's note   I have taken an interval history, reviewed the chart and examined the patient. I agree with the Advanced Practitioner's note, impression and recommendations.  Severe hemorrhagic esophagitis which has developed over the past few weeks likely related to recumbent position, ESRD, MM, malnutrition and possibly not keeping down his po PPI. Esophageal biopsies are pending. Unfortunately his Hb has dropped to 7. Hiccups, nausea, regurg all continue but are slightly improved. Avoid all anticoagulants, no more than a full liquid diet, continue IV PPI bid for now, add RTC Zofran, add Baclofen and keep  HOB elevated > 30 at all times. Discussions involving goals of care and hospice. Dr. Julien Nordmann, Dr. Sheran Fava and I discussed his care. I spent 35 minutes of time with the patient, patients family and in discussions with Drs. Short and Mohamed. Greater than 50% of the time was spent counseling and coordinating care.  [name], MD [facility] [phone] Mon-Fri 8a-5p [phone] after 5p, weekends, holidays

## 2015-06-22 NOTE — Progress Notes (Signed)
   06/22/15 1200  Clinical Encounter Type  Visited With Family  Visit Type Initial;Psychological support;Spiritual support  Referral From Family  Spiritual Encounters  Spiritual Needs Emotional;Grief support  Daughter reached out to [facility] in elevator and asked for some emotional support; Ch spoke at length with daughter about her grief and pt's diagnosis; [place] to visit with family for follow-up this afternoon; Gwynn Burly 12:27 PM

## 2015-06-22 NOTE — Progress Notes (Signed)
Eddie Thomas Progress Note  Assessment: 1  Nausea/ vomiting/ hiccups - severe esophagitis by EGD 6/1.  On IV PPI, may need TNA per GI.  Biopsies pending. 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  He says nausea is "a little better".  Says the doctor brought up "hospice", says he maybe would consider hospice "home care".    Objective Filed Vitals:   06/22/15 0409 06/22/15 0743 06/22/15 1255 06/22/15 1320  BP: 134/63 146/64 162/65 154/72  Pulse: 96 103 95 97  Temp: 98.3 F (36.8 C) 98 F (36.7 C) 99.1 F (37.3 C) 99.1 F (37.3 C)  TempSrc: Oral Oral Oral Axillary  Resp: 20 18 18 18   Height:      Weight:      SpO2: 100% 100% 100% 100%   Physical Exam General: ill appearing sitting in chair;  Heart:  RRR Lungs: no rales Abdomen: soft ntnd Extremities: no edema Dialysis Access: right IJ - left upper AVF + bruit  DIalysis:  TTS [place]  4h  62.5kg  3/2.25 bath  Heparin none  R IJ cath/ L BC AVG (placed 06/08/15, Dr Kellie Simmering) Hect 1 ug   Mirc 200 q 2    Labs: Basic Metabolic Panel:  Recent Labs Lab 06/19/15 1006 06/21/15 0450 06/22/15 0520  NA  135 139 140  K 3.8 4.3 3.9  CL 96* 100* 100*  CO2 26 22 27   GLUCOSE 120* 124* 122*  BUN 13 43* 23*  CREATININE 3.70* 8.41* 4.36*  CALCIUM 8.0* 8.2* 8.2*  PHOS 1.7*  --   --    Liver Function Tests:  Recent Labs Lab 06/19/15 1006  ALBUMIN 2.2*   CBC:  Recent Labs Lab 06/17/15 0245 06/19/15 0412 06/20/15 0430 06/21/15 0450 06/22/15 0520  WBC 4.2 3.5* 3.7* 3.4* 2.8*  NEUTROABS  --  3.0  --   --   --   HGB 7.9* 7.2* 8.9* 8.0* 7.0*  HCT 25.1* 22.9* 28.3* 24.9* 22.3*  MCV 83.7 82.1 80.9 80.6 81.4  PLT 72* 73* 69* 83* 67*   Blood Culture    Component Value Date/Time   SDES BLOOD RIGHT HAND 06/16/2015 2230   SPECREQUEST IN PEDIATRIC BOTTLE 1CC 06/16/2015 2230   CULT NO GROWTH 5 DAYS 06/16/2015 2230   REPTSTATUS 06/21/2015 FINAL 06/16/2015 2230    Cardiac Enzymes:  Recent Labs Lab 06/18/15 1840 06/18/15 2315 06/19/15 0412  TROPONINI 0.04* 0.03 0.05*   CBG:  Recent Labs Lab 06/21/15 1944 06/22/15 0004 06/22/15 0408 06/22/15 0749 06/22/15 1158  GLUCAP  152* 116* 124* 144* 185*    Medications: . pantoprozole (PROTONIX) infusion 8 mg/hr (06/21/15 2122)   . sodium chloride   Intravenous Once  . sodium chloride   Intravenous Once  . acetaminophen  650 mg Oral Once  . ampicillin-sulbactam (UNASYN) IV  1.5 g Intravenous Q12H  . calcitRIOL  0.25 mcg Oral Q T,Th,Sa-HD  . darbepoetin (ARANESP) injection - DIALYSIS  100 mcg Intravenous Q Tue-HD  . feeding supplement (ENSURE ENLIVE)  237 mL Oral BID BM  . insulin aspart  0-9 Units Subcutaneous Q4H  . metoCLOPramide (REGLAN) injection  5 mg Intravenous Q6H  . metoprolol  2.5 mg Intravenous Q6H  . sodium chloride flush  3 mL Intravenous Q12H  . sodium chloride flush  3 mL Intravenous Q12H

## 2015-06-22 NOTE — Progress Notes (Signed)
Pt continues to cough up bright red blood and has second episode of loose watery stool incontinence. Linens changed and patient cleansed. Will continue to monitor closely. Dorthey Sawyer, RN

## 2015-06-23 LAB — BASIC METABOLIC PANEL
ANION GAP: 13 (ref 5–15)
BUN: 44 mg/dL — ABNORMAL HIGH (ref 6–20)
CHLORIDE: 103 mmol/L (ref 101–111)
CO2: 25 mmol/L (ref 22–32)
Calcium: 8.6 mg/dL — ABNORMAL LOW (ref 8.9–10.3)
Creatinine, Ser: 6.68 mg/dL — ABNORMAL HIGH (ref 0.61–1.24)
GFR, EST AFRICAN AMERICAN: 9 mL/min — AB (ref >60)
GFR, EST NON AFRICAN AMERICAN: 8 mL/min — AB (ref >60)
Glucose, Bld: 154 mg/dL — ABNORMAL HIGH (ref 65–99)
POTASSIUM: 3.8 mmol/L (ref 3.5–5.1)
SODIUM: 141 mmol/L (ref 135–145)

## 2015-06-23 LAB — TYPE AND SCREEN
ABO/RH(D): AB POS
Antibody Screen: POSITIVE
DAT, IgG: NEGATIVE
Unit division: 0
Unit division: 0
Unit division: 0
Unit division: 0

## 2015-06-23 LAB — CBC
HEMATOCRIT: 23.1 % — AB (ref 39.0–52.0)
HEMOGLOBIN: 7.4 g/dL — AB (ref 13.0–17.0)
MCH: 25.1 pg — ABNORMAL LOW (ref 26.0–34.0)
MCHC: 32 g/dL (ref 30.0–36.0)
MCV: 78.3 fL (ref 78.0–100.0)
Platelets: 61 10*3/uL — ABNORMAL LOW (ref 150–400)
RBC: 2.95 MIL/uL — AB (ref 4.22–5.81)
RDW: 18.4 % — AB (ref 11.5–15.5)
WBC: 2.8 10*3/uL — AB (ref 4.0–10.5)

## 2015-06-23 LAB — GLUCOSE, CAPILLARY
GLUCOSE-CAPILLARY: 148 mg/dL — AB (ref 65–99)
GLUCOSE-CAPILLARY: 174 mg/dL — AB (ref 65–99)
Glucose-Capillary: 190 mg/dL — ABNORMAL HIGH (ref 65–99)
Glucose-Capillary: 126 mg/dL — ABNORMAL HIGH (ref 65–99)
Glucose-Capillary: 262 mg/dL — ABNORMAL HIGH (ref 65–99)
Glucose-Capillary: 133 mg/dL — ABNORMAL HIGH (ref 65–99)

## 2015-06-23 LAB — PROTIME-INR
INR: 1.15 (ref 0.00–1.49)
Prothrombin Time: 14.9 seconds (ref 11.6–15.2)

## 2015-06-23 MED ORDER — LORAZEPAM 2 MG/ML IJ SOLN
0.2500 mg | Freq: Four times a day (QID) | INTRAMUSCULAR | Status: DC
  Administered 2015-06-23 – 2015-06-25 (×6): 0.25 mg via INTRAVENOUS
  Filled 2015-06-23 (×6): qty 1

## 2015-06-23 MED ORDER — ALTEPLASE 2 MG IJ SOLR: 2.0000 mg | Freq: Once | INTRAMUSCULAR | Status: DC | PRN

## 2015-06-23 MED ORDER — SODIUM CHLORIDE 0.9 % IV SOLN: 100.0000 mL | INTRAVENOUS | Status: DC | PRN

## 2015-06-23 MED ORDER — HEPARIN SODIUM (PORCINE) 1000 UNIT/ML DIALYSIS: 1000.0000 [IU] | INTRAMUSCULAR | Status: DC | PRN

## 2015-06-23 MED ORDER — LIDOCAINE-PRILOCAINE 2.5-2.5 % EX CREA: 1.0000 "application " | TOPICAL_CREAM | CUTANEOUS | Status: DC | PRN

## 2015-06-23 MED ORDER — SODIUM CHLORIDE 0.9 % IV SOLN
100.0000 mL | INTRAVENOUS | Status: DC | PRN
Start: 2015-06-23 — End: 2015-06-23

## 2015-06-23 MED ORDER — DIPHENOXYLATE-ATROPINE 2.5-0.025 MG/5ML PO LIQD: 5.0000 mL | Freq: Four times a day (QID) | ORAL | Status: DC | PRN

## 2015-06-23 MED ORDER — PENTAFLUOROPROP-TETRAFLUOROETH EX AERO: 1.0000 "application " | INHALATION_SPRAY | CUTANEOUS | Status: DC | PRN

## 2015-06-23 MED ORDER — LIDOCAINE HCL (PF) 1 % IJ SOLN: 5.0000 mL | INTRAMUSCULAR | Status: DC | PRN

## 2015-06-23 MED ORDER — LORAZEPAM 2 MG/ML IJ SOLN
0.5000 mg | INTRAMUSCULAR | Status: DC | PRN
  Administered 2015-06-23 – 2015-06-25 (×3): 1 mg via INTRAVENOUS
  Filled 2015-06-23 (×3): qty 1

## 2015-06-23 MED ORDER — METOCLOPRAMIDE HCL 5 MG PO TABS
5.0000 mg | ORAL_TABLET | Freq: Three times a day (TID) | ORAL | Status: DC
  Administered 2015-06-23: 5 mg via ORAL
  Filled 2015-06-23: qty 1

## 2015-06-23 NOTE — Progress Notes (Addendum)
Daily Rounding Note  06/23/2015, 10:58 AM  LOS: 7 days   SUBJECTIVE:       Feels better,  Less hiccups, no vomiting or regurge this AM  OBJECTIVE:         Vital signs in last 24 hours:    Temp:  [97.5 F (36.4 C)-99.1 F (37.3 C)] 97.9 F (36.6 C) (06/03 0805) Pulse Rate:  [86-107] 107 (06/03 1030) Resp:  [12-20] 20 (06/03 1030) BP: (111-162)/(53-80) 111/66 mmHg (06/03 1030) SpO2:  [97 %-100 %] 100 % (06/03 0805) Weight:  [60.9 kg (134 lb 4.2 oz)] 60.9 kg (134 lb 4.2 oz) (06/03 0805) Last BM Date: 06/23/15 Filed Weights   06/21/15 0832 06/21/15 1330 06/23/15 0805  Weight: 60.782 kg (134 lb) 60.8 kg (134 lb 0.6 oz) 60.9 kg (134 lb 4.2 oz)   General: pleasant, comfortable, thin.   Heart: RRR Chest: clear bil.  Wet vocal quality diminished Abdomen: soft, NT, ND.  Active BS  Extremities: no CCE Neuro/Psych:  Oriented x 3, calm, affect flat though not obviously distressed.   Intake/Output from previous day: 06/02 0701 - 06/03 0700 In: 1335 [P.O.:600; Blood:735] Out: 100 [Urine:100]  Intake/Output this shift:    Lab Results:  Recent Labs  06/21/15 0450 06/22/15 0520 06/23/15 0545  WBC 3.4* 2.8* 2.8*  HGB 8.0* 7.0* 7.4*  HCT 24.9* 22.3* 23.1*  PLT 83* 67* 61*   BMET  Recent Labs  06/21/15 0450 06/22/15 0520 06/23/15 0545  NA 139 140 141  K 4.3 3.9 3.8  CL 100* 100* 103  CO2 22 27 25   GLUCOSE 124* 122* 154*  BUN 43* 23* 44*  CREATININE 8.41* 4.36* 6.68*  CALCIUM 8.2* 8.2* 8.6*   LFT No results for input(s): PROT, ALBUMIN, AST, ALT, ALKPHOS, BILITOT, BILIDIR, IBILI in the last 72 hours. PT/INR  Recent Labs  06/21/15 1150 06/23/15 0545  LABPROT 16.9* 14.9  INR 1.36 1.15   Hepatitis Panel No results for input(s): HEPBSAG, HCVAB, HEPAIGM, HEPBIGM in the last 72 hours.  Studies/Results: No results found.   Scheduled Meds: . acetaminophen  650 mg Oral Once  . ampicillin-sulbactam  (UNASYN) IV  1.5 g Intravenous Q12H  . calcitRIOL  0.25 mcg Oral Q T,Th,Sa-HD  . darbepoetin (ARANESP) injection - DIALYSIS  100 mcg Intravenous Q Tue-HD  . feeding supplement (ENSURE ENLIVE)  237 mL Oral TID BM  . insulin aspart  0-9 Units Subcutaneous Q4H  . metoCLOPramide (REGLAN) injection  5 mg Intravenous Q6H  . metoprolol  2.5 mg Intravenous Q6H  . ondansetron (ZOFRAN) IV  8 mg Intravenous Q6H  . sodium chloride flush  3 mL Intravenous Q12H   Continuous Infusions: . pantoprozole (PROTONIX) infusion 8 mg/hr (06/23/15 1032)   PRN Meds:.acetaminophen **OR** acetaminophen, chlorproMAZINE (THORAZINE) IV, HYDROmorphone (DILAUDID) injection, LORazepam, ondansetron **OR** ondansetron (ZOFRAN) IV, sodium chloride flush   ASSESMENT:   * Nausea, vomiting, pinkish bloody emesis, hiccups all improving  Repeat EGD of 6/1: LA class grade D, hemorrhagic esophagitis with blood clot and oozing in distal esophagus. Mild, distal esophageal stricture and small HH. Biopsies obtained, path pending.   *  Advanced MM.  Dr Earlie Server is recommending palliative care sees little utility to continuing chemo.  *  ESRD.     PLAN   *  Finish PPI drip (finishes 6/4 at noon), then BID PO Protonix.  Switch to po Reglan (lower 5 mg dose due to ESRD).   Keep on full liquids,  advance to soft ESRD diet tomorrow if all is well. Continue RTC Zofran and resume Baclofen if hiccups return.    Azucena Freed  06/23/2015, 10:58 AM Pager: [phone]    Attending physician's note   I have taken an interval history, reviewed the chart and examined the patient. I agree with the Advanced Practitioner's note, impression and recommendations.  Nausea, regurgitation, hematemesis, hiccups all improved. Baclofen was discontinued. Continue IV PPI infusion today and if he continues to do well change to PPI po bid tomorrow. Continue RTC/scheduled Zofran. Continue full liquids today. Can try a soft diet tomorrow if he continues to  do well however would not advance beyond soft given his severe esophagitis and mild esophageal stricture.   [name], MD [facility] [phone] Mon-Fri 8a-5p [phone] after 5p, weekends, holidays

## 2015-06-23 NOTE — Progress Notes (Addendum)
PROGRESS NOTE  Eddie Thomas  JKK:[id number] DOB: 1945/07/26 DOA: 06/16/2015 PCP: [name]  Brief Narrative:  70 y.o. male with medical history significant of multiple myeloma, IgA subtype, candidal esophagitis with stricture dilated in late April by Dr. Fuller Plan, ESRD, hypertension, diabetes mellitus, hx of DVT no longer on anticoagulation due to bleeding complications.  He was recently hospitalized in Mid-May for acute renal failure likely secondary to progressive multiple myeloma.  He was started on dialysis and deemed ESRD.  He was discharged, but presented a few days later with fever, nausea and vomiting.  He likely had aspiration pneumonia and may have some malignancy related fevers superimposed.  He has had rapidly worsening anemia requiring more frequent blood transfusions.  Blood transfusions are complicated by his multiple myeloma and by the medications he has received to treat his myeloma which may finding matching blood difficult (takes several days to acquire blood).  Although his myeloma is contributing to his pancytopenia, his anemia is further worsened by his hematemesis.  He underwent EGD on 6/1 which demonstrated hemorrhagic esophagitis.  Sitting upright and using PPI have improved his hemorrhage.  Due to his progressive cancer, prolonged hospitalizations, and refractory nausea/vomiting, he has become cachectic and frail with severe protein calorie malnutrition.  His prognosis is poor.  Oncology and palliative care are following and recommending hospice care.  Patient and family are considering their options.    Assessment & Plan:   Principal Problem:   SIRS (systemic inflammatory response syndrome) (HCC) Active Problems:   Multiple myeloma (HCC)   Diabetes mellitus (HCC)   Antineoplastic chemotherapy induced anemia   Hypoalbuminemia due to protein-calorie malnutrition (HCC)   Generalized weakness   ESRD on hemodialysis (HCC)   Nausea and vomiting   Aspiration  pneumonitis (HCC)   Pyrexia   Protein-calorie malnutrition, severe   FUO (fever of unknown origin)   Hematemesis with nausea   Dysphagia, pharyngoesophageal phase   Palliative care encounter  SIRS/Fever, suspect aspiration pneumonitis given his frequent hematemesis and partially obstructed esophagus vs neoplastic fever from widespread myeloma.   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  Prognosis is poor. - TDC and AVF placed by [facility] -  Outpatient TTS dialysis from [facility]  Hiccups, vomiting, and hematemesis due to grade D reflux esophagitis.  Esophagitis likely due to malnutrition and remaining supine in bed.  Improving. - [place] [facility] assistance appreciated - EGD on 6/1 found hemorrhagic esophagitis - pathology from 6/1 esophageal biopsies pending - continue full liquid diet - continue IV PPI  - Appreciate Palliative care assistance   Multiple Myeloma, progressing with worsening anemia, new ESRD, rising B2 microglobulin, multiple lytic lesions - refractory to multiple chemo regimens and SCT in 2012 - Dr. Julien Nordmann had family meeting on 6/2 with patient, wife, and daughter [name] and recommended hospice care - Palliative care consult pending  Acute toxic and metabolic encephalopathy, resolved, likely multifactorial including infection, medications, worsening anemia -06/12/15 CT brain negative -ammonia--24,  -vitamin B12--266 > started supplementation -TSH--0.493, Free T4--1.04 -Discontinued Thorazine, baclofen, opioids, gabapentin, other hypnotics which helped  Atypical Chest Pain, likely due to esophageal stricture and dilation -VQ scan neg -EKG-no concerning ischemic changes -troponins minimally elevated due to ESRD  Anemia of neoplastic  disease and acute blood loss.  Multiple antibodies related to his  myeloma medications make finding compatible blood difficult - transfused 2 units PRBC earlier on 06/04/15. - Transfused 2 units of PRBC 5/23 - 2 units PRBC on 5/30 - 1 unit PRBC on 6/1 -  Requested blood bank to obtain two more matched units on 6/2  Pancytopenia - Secondary to multiple myeloma and related cancer chemotherapy.  NSVT: - Patient had 15 beats of nonsustained V. tach, hypomagnesemia corrected,  - 2-D echo: Normal EF. - continue IV metoprolol for now  History of DVT - Xarelto DC'ed given renal function (took Xarelto approximately one year) - Not good anticoagulation candidate due to bleeding complications.   Essential hypertension, blood pressures decreasing and intermittently hypotensive - ACEI stopped due to acute renal failure.  - d/c IV metoprolol for now - consider small IVF bolus  Diabetes mellitus type 2 -CBGs remained largely controlled -novolog sliding scale for now  Severe protein calorie malnutrition - advance diet - continue supplements  DVT prophylaxis:  heparin Code Status:  DNR Family Communication:  Patient, daughter [name] at bedside Disposition Plan:  Recommending hospice care.  Goal for discharge:  Needs to be tolerating oral medications and able to stay hydrated.  Currently still on continuous PPI infusion.  Home with hospice or home health services hopefully this coming week  Consultants:   GI  Nephrology  Vascular surgery  Oncology  Palliative care  Procedures:  none  Antimicrobials:   Zosyn   Augmentin  Unasyn    Subjective: Still having watery stools.  Nausea and hiccoughs are better.  No hematemesis since yesterday.    Objective: Filed Vitals:   06/23/15 1200 06/23/15 1216 06/23/15 1302 06/23/15 1559  BP: 119/65 103/68 106/54 114/57  Pulse: 107 107 121 109  Temp:  98 F (36.7 C) 98.5 F (36.9 C) 98.9 F (37.2 C)  TempSrc:  Oral Oral Oral    Resp: 18 18 17 18   Height:      Weight:  60.8 kg (134 lb 0.6 oz)    SpO2:  100% 100% 100%    Intake/Output Summary (Last 24 hours) at 06/23/15 1610 Last data filed at 06/23/15 1216  Gross per 24 hour  Intake    455 ml  Output      0 ml  Net    455 ml   Filed Weights   06/21/15 1330 06/23/15 0805 06/23/15 1216  Weight: 60.8 kg (134 lb 0.6 oz) 60.9 kg (134 lb 4.2 oz) 60.8 kg (134 lb 0.6 oz)    Examination:  General exam:  Cachectic adult male.  No acute distress.  HEENT:  NCAT, MMM Respiratory system:  CTAB Cardiovascular system: RRR, normal S1/S2. No murmurs, rubs, gallops or clicks.  Warm extremities Gastrointestinal system: Normal active bowel sounds, soft, nondistended, nontender MSK:  Normal tone and bulk, no lower extremity edema Neuro:  Grossly intact    Data Reviewed: I have personally reviewed following labs and imaging studies  CBC:  Recent Labs Lab 06/19/15 0412 06/20/15 0430 06/21/15 0450 06/22/15 0520 06/23/15 0545  WBC 3.5* 3.7* 3.4* 2.8* 2.8*  NEUTROABS 3.0  --   --   --   --   HGB 7.2* 8.9* 8.0* 7.0* 7.4*  HCT 22.9* 28.3* 24.9* 22.3* 23.1*  MCV 82.1 80.9 80.6 81.4 78.3  PLT 73* 69* 83* 67* 61*   Basic Metabolic Panel:  Recent Labs Lab 06/17/15 0245 06/19/15 1006 06/21/15 0450 06/22/15 0520 06/23/15 0545  NA 134* 135 139 140 141  K 4.7 3.8 4.3 3.9 3.8  CL 97* 96* 100* 100* 103  CO2 26 26 22 27 25   GLUCOSE 141* 120* 124* 122* 154*  BUN 20 13 43* 23* 44*  CREATININE 4.69* 3.70* 8.41* 4.36* 6.68*  CALCIUM 7.8* 8.0* 8.2* 8.2* 8.6*  PHOS  --  1.7*  --   --   --    GFR: Estimated Creatinine Clearance: 9 mL/min (by C-G formula based on Cr of 6.68). Liver Function Tests:  Recent Labs Lab 06/19/15 1006  ALBUMIN 2.2*   No results for input(s): LIPASE, AMYLASE in the last 168 hours. No results for input(s): AMMONIA in the last 168 hours. Coagulation Profile:  Recent Labs Lab 06/21/15 1150 06/23/15 0545  INR 1.36 1.15   Cardiac  Enzymes:  Recent Labs Lab 06/18/15 1840 06/18/15 2315 06/19/15 0412  TROPONINI 0.04* 0.03 0.05*   BNP (last 3 results) No results for input(s): PROBNP in the last 8760 hours. HbA1C: No results for input(s): HGBA1C in the last 72 hours. CBG:  Recent Labs Lab 06/22/15 1945 06/22/15 2354 06/23/15 0441 06/23/15 0745 06/23/15 1258  GLUCAP 242* 174* 148* 190* 126*   Lipid Profile: No results for input(s): CHOL, HDL, LDLCALC, TRIG, CHOLHDL, LDLDIRECT in the last 72 hours. Thyroid Function Tests: No results for input(s): TSH, T4TOTAL, FREET4, T3FREE, THYROIDAB in the last 72 hours. Anemia Panel: No results for input(s): VITAMINB12, FOLATE, FERRITIN, TIBC, IRON, RETICCTPCT in the last 72 hours. Urine analysis:    Component Value Date/Time   COLORURINE YELLOW 06/01/2015 [place] 06/01/2015 1540   LABSPEC 1.008 06/01/2015 1540   PHURINE 6.0 06/01/2015 1540   GLUCOSEU NEGATIVE 06/01/2015 1540   HGBUR TRACE* 06/01/2015 1540   BILIRUBINUR NEGATIVE 06/01/2015 1540   KETONESUR NEGATIVE 06/01/2015 1540   PROTEINUR 30* 06/01/2015 1540   UROBILINOGEN 1.0 01/16/2012 1902   NITRITE NEGATIVE 06/01/2015 1540   LEUKOCYTESUR NEGATIVE 06/01/2015 1540   Sepsis Labs: @LABRCNTIP (procalcitonin:4,lacticidven:4)  ) Recent Results (from the past 240 hour(s))  Blood culture (routine x 2)     Status: None   Collection Time: 06/16/15 10:11 PM  Result Value Ref Range Status   Specimen Description BLOOD RIGHT ANTECUBITAL  Final   Special Requests BOTTLES DRAWN AEROBIC AND ANAEROBIC 5CC   Final   Culture NO GROWTH 5 DAYS  Final   Report Status 06/21/2015 FINAL  Final  Blood culture (routine x 2)     Status: None   Collection Time: 06/16/15 10:30 PM  Result Value Ref Range Status   Specimen Description BLOOD RIGHT HAND  Final   Special Requests IN PEDIATRIC BOTTLE [place]  Final   Culture NO GROWTH 5 DAYS  Final   Report Status 06/21/2015 FINAL  Final      Radiology  Studies: No results found.   Scheduled Meds: . acetaminophen  650 mg Oral Once  . ampicillin-sulbactam (UNASYN) IV  1.5 g Intravenous Q12H  . calcitRIOL  0.25 mcg Oral Q T,Th,Sa-HD  . darbepoetin (ARANESP) injection - DIALYSIS  100 mcg Intravenous Q Tue-HD  . feeding supplement (ENSURE ENLIVE)  237 mL Oral TID BM  . insulin aspart  0-9 Units Subcutaneous Q4H  . metoCLOPramide  5 mg Oral TID AC & HS  . metoprolol  2.5 mg Intravenous Q6H  . ondansetron (ZOFRAN) IV  8 mg Intravenous Q6H  . sodium chloride flush  3 mL Intravenous Q12H   Continuous Infusions: . pantoprozole (PROTONIX) infusion 8 mg/hr (06/23/15 1032)     LOS: 7 days    Time spent:  30 min    Janece Canterbury, MD [facility] Pager [phone]  If 7PM-7AM, please contact night-coverage [email or web address] Password [email or web address] 06/23/2015, 4:10 PM

## 2015-06-23 NOTE — Progress Notes (Signed)
Janne Napoleon paged for CBG [phone]

## 2015-06-23 NOTE — Progress Notes (Signed)
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 Patient is considering his options.   Plan - HD today, no UF, no hep  Kelly Splinter MD [place] pager [phone]    cell [phone] 06/23/2015, 9:34 AM   Subjective:   No c/o  Objective Filed Vitals:   06/23/15 0814 06/23/15 0815 06/23/15 0830 06/23/15 0856  BP: 146/77 146/77 130/67 134/77  Pulse: 89 89 86 95  Temp:      TempSrc:      Resp: 18 18 12 16   Height:      Weight:      SpO2:       Physical Exam General: ill appearing sitting in chair;  Heart:  RRR Lungs: no rales Abdomen: soft ntnd Extremities: no edema Dialysis Access: right IJ - left upper AVF + bruit  DIalysis:  TTS [place]  4h  62.5kg  3/2.25 bath  Heparin none  R IJ cath/ L BC AVG (placed 06/08/15, Dr Kellie Simmering) Hect 1 ug   Mirc 200 q 2    Labs: Basic Metabolic Panel:  Recent Labs Lab 06/19/15 1006 06/21/15 0450 06/22/15 0520 06/23/15 0545  NA 135 139 140 141  K 3.8 4.3 3.9 3.8  CL 96* 100* 100* 103  CO2 26 22 27 25   GLUCOSE 120* 124* 122* 154*  BUN 13 43* 23* 44*  CREATININE 3.70* 8.41* 4.36* 6.68*  CALCIUM 8.0* 8.2* 8.2* 8.6*  PHOS 1.7*  --   --   --    Liver Function Tests:  Recent Labs Lab 06/19/15 1006  ALBUMIN 2.2*   CBC:  Recent Labs Lab 06/19/15 0412 06/20/15 0430 06/21/15 0450 06/22/15 0520 06/23/15 0545  WBC 3.5* 3.7* 3.4* 2.8* 2.8*  NEUTROABS  3.0  --   --   --   --   HGB 7.2* 8.9* 8.0* 7.0* 7.4*  HCT 22.9* 28.3* 24.9* 22.3* 23.1*  MCV 82.1 80.9 80.6 81.4 78.3  PLT 73* 69* 83* 67* 61*   Blood Culture    Component Value Date/Time   SDES BLOOD RIGHT HAND 06/16/2015 2230   SPECREQUEST IN PEDIATRIC BOTTLE 1CC 06/16/2015 2230   CULT NO GROWTH 5 DAYS 06/16/2015 2230   REPTSTATUS 06/21/2015 FINAL 06/16/2015 2230    Cardiac Enzymes:  Recent Labs Lab 06/18/15 1840 06/18/15 2315 06/19/15 0412  TROPONINI 0.04* 0.03 0.05*   CBG:  Recent Labs Lab 06/22/15 1710 06/22/15 1945 06/22/15 2354 06/23/15 0441 06/23/15 0745  GLUCAP 225* 242* 174* 148* 190*    Medications: . pantoprozole (PROTONIX) infusion 8 mg/hr (06/21/15 2122)   . sodium chloride   Intravenous Once  . acetaminophen  650 mg Oral Once  . ampicillin-sulbactam (UNASYN) IV  1.5 g Intravenous Q12H  . baclofen  10 mg Oral TID  . calcitRIOL  0.25 mcg Oral Q T,Th,Sa-HD  . darbepoetin (ARANESP) injection - DIALYSIS  100 mcg Intravenous Q Tue-HD  . feeding supplement (ENSURE ENLIVE)  237 mL Oral TID BM  . insulin aspart  0-9 Units Subcutaneous Q4H  . metoCLOPramide (REGLAN) injection  5 mg Intravenous Q6H  . metoprolol  2.5 mg Intravenous Q6H  . ondansetron (ZOFRAN) IV  8 mg Intravenous Q6H  . sodium chloride flush  3 mL Intravenous Q12H  . sodium chloride flush  3 mL Intravenous Q12H

## 2015-06-23 NOTE — Consult Note (Signed)
Consultation Note Date: 06/23/2015   Patient Name: Eddie Thomas  DOB: 1945-10-22  MRN: [id number]  Age / Sex: 70 y.o., male  PCP: Iva Lento, PA-C Referring Physician: Janece Canterbury, MD  Reason for Consultation: Establishing goals of care, Hospice Evaluation, Non pain symptom management, Pain control and Psychosocial/spiritual support  HPI/Patient Profile: 70 y.o. male  with past medical history of Multiple myeloma ( followed by Dr. Julien Nordmann, chronic kidney disease (baseline 1.6-2.0), hypertension, diabetes, admitted on 06/16/2015 presented to the ED with complaints of fever or chills nausea and vomiting since discharge to home on 06/15/2015. He was evaluated in the emergency room and a sepsis workup was initiated and he was placed on IV vancomycin, Zosyn and referred for admission. During his previous hospitalization he was found to be in acute renal failure and was admitted with a creatinine of 10 on 06/01/2015. Nephrology was consulted during this hospitalization and he was initiated on dialysis. A tunnel catheter was placed. Patient has had intractable hiccups during this hospitalization as well as ongoing. Patient also had coffee ground emesis during that hospitalization but at that time an EGD was not indicated as patient had just had an EGD on 05/16/2015 his PPI was increased to twice daily and Reglan was added. After this admission patient began to vomit bright red blood, and this is been very consistent requiring multiple transfusions he is continued to be febrile intermittently. He also has had intractable hiccups despite Reglan being on board and a trial of baclofen (this was dc'd S patient wishes to continue dialysis). Despite transfusion his hemoglobin today is 7.4. He also is having diarrhea and today it has been reported that it was bright red blood streaked. No clots reported. He was started on  scheduled Zofran 06/23/2015 and he has not vomited blood overnight. His  hiccups have continued. In addition to intractable hiccups, nausea and vomiting, he also is reporting feeling very anxious. Clinical Assessment and Goals of Care: Just prior to entering the room patient was requesting Ativan and did receive 1 mg IV. It is notable that as the medication began to take effect his hiccups stopped. His daughter [name] reports that when he sleeps he does not hiccup. I spoke with both his daughter [name] as well as his wife. Mrs. Nick does understand English and speaks some English but most of history was given by [name]. Mrs. Hixon is [place]. [name] is very tearful when she talks about how sick her father is but she does acknowledge that she knows that he "has less than 6 months to live" . She readily states that her father as well as the family are not ready "for hospice" I reassured them that that was not my role here that we would focus on symptom management and support in whatever way that family felt was appropriate. She states that her overall goal is to see if the esophagitis can heal and patient can come home for a period of time.  NEXT OF KIN wife with the support of her son  and daughter    SUMMARY OF RECOMMENDATIONS   DNR/DNI Patient wishes to continue dialysis at this time. I did explain what would be required if patient in order to continue dialysis in the outpatient setting No hospice at present. He would not qualify for hospice at this point as long as he is undergoing dialysis. Should he decide to stop or become clinically unable he certainly would be eligible for hospice likely inpatient as well as in-home. I did share with with [name] that he was hospice appropriate if he were to stop dialysis Would recommend upon discharge if things stand as they are today in terms of dialysis, that he be discharged home with a palliative care consult in the home either from care connection services  through hospice of the [place], or hospice and palliative care services of [place]. Continue with transfusions as indicated Continue antibiotics as indicated Continue IV fluids as indicated  Code Status/Advance Care Planning:  DNR    Symptom Management:   Pain: Patient has had minimal reports of pain overnight. No usage of when necessary's. Continue with Dilaudid 0.5 mg to 1 mg when necessary.  Dyspnea: Continue with O2 via nasal cannula as well as opioids. Patient would wish to be transfused again if his hemoglobin drops below 7  Diarrhea: We'll discontinue Reglan. Concerning that patient now is showing bleeding from his rectum  Hiccups: We'll DC Reglan as well as baclofen secondary to side effect profile; start low-dose scheduled Ativan at 0.25 mg IV every 6 around-the-clock as well as 0.5-1 mg every 4 when necessary for anxiety as well  insomnia  Palliative Prophylaxis:   Aspiration, Bowel Regimen, Delirium Protocol, Frequent Pain Assessment, Oral Care and Turn Reposition  Additional Recommendations (Limitations, Scope, Preferences):  No Tracheostomy  Psycho-social/Spiritual:   Desire for further Chaplaincy support:no  Additional Recommendations: Grief/Bereavement Support  Prognosis:   < 2 weeks in the setting of worsening multiple myeloma, pancytopenia, renal failure now on hemodialysis, active bleeding both from his esophagus and now from his rectum. Patient is at high risk for an acute event. This is not a prognosis that I have shared with family at this point as patient and family have just spoken to Dr. Julien Nordmann 06/23/2015 and are still struggling with this news. Goal is to continue to maintain a therapeutic relationship with patient and his family in order to support them going forward  Discharge Planning: Family is hopeful for discharge home, but I am concerned that he may not be able to achieve this and not survive this hospitalization. We will continue with all  treatment options at this point including hemodialysis transfusions antibiotics fluids, etc. We'll monitor and support the family as patient's clinical condition changes.       Primary Diagnoses: Present on Admission:  . SIRS (systemic inflammatory response syndrome) (HCC) . Hypoalbuminemia due to protein-calorie malnutrition ([place]) . Multiple myeloma ([place]) . Nausea and vomiting . Antineoplastic chemotherapy induced anemia  I have reviewed the medical record, interviewed the patient and family, and examined the patient. The following aspects are pertinent.  Past Medical History  Diagnosis Date  . Diabetes mellitus ([place]) 07/03/2011  . Hypertension 07/03/2011  . Hyperlipidemia 07/03/2011  . Colon polyps 2012  . Gastric ulcer   . Bulging lumbar disc   . Encounter for antineoplastic chemotherapy 03/05/2015  . Anemia   . Blood transfusion without reported diagnosis 2015  . Multiple myeloma 2011    currently undergoing chemo  . Cataract    Social History  Social History  . Marital Status: Married    Spouse Name: N/A  . Number of Children: 2  . Years of Education: N/A   Occupational History  . retired    Social History Main Topics  . Smoking status: Never Smoker   . Smokeless tobacco: Never Used  . Alcohol Use: No  . Drug Use: No  . Sexual Activity: Not Asked   Other Topics Concern  . None   Social History Narrative   Family History  Problem Relation Age of Onset  . Diabetes Mother   . Hyperlipidemia Mother   . Diabetes Sister   . Diabetes Brother   . Colon cancer Maternal Uncle 80   Scheduled Meds: . acetaminophen  650 mg Oral Once  . calcitRIOL  0.25 mcg Oral Q T,Th,Sa-HD  . darbepoetin (ARANESP) injection - DIALYSIS  100 mcg Intravenous Q Tue-HD  . feeding supplement (ENSURE ENLIVE)  237 mL Oral TID BM  . insulin aspart  0-9 Units Subcutaneous Q4H  . LORazepam  0.25 mg Intravenous Q6H  . ondansetron (ZOFRAN) IV  8 mg Intravenous Q6H  . sodium chloride flush   3 mL Intravenous Q12H   Continuous Infusions: . pantoprozole (PROTONIX) infusion 8 mg/hr (06/23/15 1032)   PRN Meds:.acetaminophen **OR** acetaminophen, chlorproMAZINE (THORAZINE) IV, diphenoxylate-atropine, HYDROmorphone (DILAUDID) injection, LORazepam, ondansetron **OR** ondansetron (ZOFRAN) IV, sodium chloride flush Medications Prior to Admission:  Prior to Admission medications   Medication Sig Start Date End Date Taking? Authorizing Provider  calcitRIOL (ROCALTROL) 0.25 MCG capsule Take 1 capsule (0.25 mcg total) by mouth Every Tuesday,Thursday,and Saturday with dialysis. 06/15/15  Yes Orson Eva, MD  chlorproMAZINE (THORAZINE) 25 MG tablet Take 1 tablet (25 mg total) by mouth 3 (three) times daily as needed for hiccoughs. 06/15/15  Yes Orson Eva, MD  feeding supplement, ENSURE ENLIVE, (ENSURE ENLIVE) LIQD Take 237 mLs by mouth 2 (two) times daily between meals. 06/15/15  Yes Orson Eva, MD  glipiZIDE (GLUCOTROL) 5 MG tablet Take 0.5 tablets (2.5 mg total) by mouth daily before breakfast. 06/15/15  Yes Orson Eva, MD  lidocaine-prilocaine (EMLA) cream Apply 1 application topically as needed. Apply one hour before use and cover with plastic wrap. 10/17/14  Yes Adrena E Johnson, PA-C  metoCLOPramide (REGLAN) 5 MG tablet Take 1 tablet (5 mg total) by mouth 3 (three) times daily before meals. 06/15/15  Yes Orson Eva, MD  metoprolol tartrate (LOPRESSOR) 25 MG tablet Take 0.5 tablets (12.5 mg total) by mouth 2 (two) times daily. 06/15/15  Yes Orson Eva, MD  montelukast (SINGULAIR) 10 MG tablet Take 10 mg by mouth at bedtime.  04/18/15 04/17/16 Yes Historical Provider, MD  multivitamin (RENA-VIT) TABS tablet Take 1 tablet by mouth at bedtime. 06/15/15  Yes Orson Eva, MD  pantoprazole (PROTONIX) 40 MG tablet TAKE ONE TABLET BY MOUTH TWICE DAILY 02/02/15  Yes Ladene Artist, MD  prochlorperazine (COMPAZINE) 10 MG tablet Take 10 mg by mouth every 6 (six) hours as needed for nausea or vomiting. Reported on  05/14/2015   Yes Historical Provider, MD  sevelamer carbonate (RENVELA) 800 MG tablet Take 1 tablet (800 mg total) by mouth 3 (three) times daily with meals. 06/15/15  Yes Orson Eva, MD  simvastatin (ZOCOR) 10 MG tablet Take 10 mg by mouth at bedtime.     Yes Historical Provider, MD  valACYclovir (VALTREX) 500 MG tablet TAKE ONE CAPLET BY MOUTH ONCE DAILY 01/24/15  Yes Curt Bears, MD  VIAGRA 50 MG tablet Take 50 mg by mouth  daily as needed for erectile dysfunction.  02/23/13  Yes Historical Provider, MD  vitamin B-12 500 MCG tablet Take 1 tablet (500 mcg total) by mouth daily. 06/15/15  Yes Orson Eva, MD   No Known Allergies Review of Systems  Unable to perform ROS: Mental status change    Physical Exam  Constitutional:  Cachetic older man  HENT:  Temporal wasting  Neck: Normal range of motion.  Pulmonary/Chest: Effort normal.  Musculoskeletal: Normal range of motion.  extremely weak  Neurological:  Very somnolent. Just received ativan  Skin: Skin is warm.  Nursing note and vitals reviewed.   Vital Signs: BP 114/57 mmHg  Pulse 109  Temp(Src) 98.9 F (37.2 C) (Oral)  Resp 18  Ht _0  (1.803 m)  Wt 60.8 kg (134 lb 0.6 oz)  BMI 18.70 kg/m2  SpO2 100% Pain Assessment: No/denies pain   Pain Score: 0-No pain   SpO2: SpO2: 100 % O2 Device:SpO2: 100 % O2 Flow Rate: .O2 Flow Rate (L/min): 2 L/min  IO: Intake/output summary:  Intake/Output Summary (Last 24 hours) at 06/23/15 1653 Last data filed at 06/23/15 1216  Gross per 24 hour  Intake    455 ml  Output      0 ml  Net    455 ml    LBM: Last BM Date: 06/23/15 Baseline Weight: Weight: 65 kg (143 lb 4.8 oz) Most recent weight: Weight: 60.8 kg (134 lb 0.6 oz) (Standing scale)     Palliative Assessment/Data:   Flowsheet Rows        Most Recent Value   Intake Tab    Referral Department  Hospitalist   Unit at Time of Referral  Med/Surg Unit   Palliative Care Primary Diagnosis  Cancer   Date Notified  06/22/15    Palliative Care Type  New Palliative care   Reason for referral  Non-pain Symptom, Clarify Goals of Care, Counsel Regarding Hospice   Date of Admission  06/16/15   Date first seen by Palliative Care  06/23/15   # of days Palliative referral response time  1 Day(s)   # of days IP prior to Palliative referral  6   Clinical Assessment    Palliative Performance Scale Score  40%   Pain Max last 24 hours  Not able to report   Pain Min Last 24 hours  Not able to report   Dyspnea Max Last 24 Hours  Not able to report   Dyspnea Min Last 24 hours  Not able to report   Nausea Max Last 24 Hours  Not able to report   Nausea Min Last 24 Hours  Not able to report   Anxiety Max Last 24 Hours  Not able to report   Anxiety Min Last 24 Hours  Not able to report   Other Max Last 24 Hours  Not able to report   Psychosocial & Spiritual Assessment    Palliative Care Outcomes    Patient/Family meeting held?  Yes   Who was at the meeting?  wife and dtr   Palliative Care Outcomes  Improved non-pain symptom therapy   Patient/Family wishes: Interventions discontinued/not started   Mechanical Ventilation   Palliative Care follow-up planned  Yes, Facility      Time In: 1530 Time Out: 1700 Time Total: 90 min Greater than 50%  of this time was spent counseling and coordinating care related to the above assessment and plan. Staffed with Dr. Sheran Fava and Dr. Jonnie Finner  Signed by: Elray Mcgregor  Mara Favero, NP   Please contact Palliative Medicine Team phone at [phone] for questions and concerns.  For individual provider: See Shea Evans

## 2015-06-24 DIAGNOSIS — D696 Thrombocytopenia, unspecified: Secondary | ICD-10-CM

## 2015-06-24 DIAGNOSIS — D62 Acute posthemorrhagic anemia: Secondary | ICD-10-CM

## 2015-06-24 LAB — CBC
HCT: 15.8 % — ABNORMAL LOW (ref 39.0–52.0)
HEMATOCRIT: 17 % — AB (ref 39.0–52.0)
HEMOGLOBIN: 5.1 g/dL — AB (ref 13.0–17.0)
Hemoglobin: 5 g/dL — CL (ref 13.0–17.0)
MCH: 24.6 pg — AB (ref 26.0–34.0)
MCH: 25.4 pg — ABNORMAL LOW (ref 26.0–34.0)
MCHC: 30 g/dL (ref 30.0–36.0)
MCHC: 31.6 g/dL (ref 30.0–36.0)
MCV: 82.1 fL (ref 78.0–100.0)
MCV: 80.2 fL (ref 78.0–100.0)
Platelets: 42 10*3/uL — ABNORMAL LOW (ref 150–400)
Platelets: 47 10*3/uL — ABNORMAL LOW (ref 150–400)
RBC: 2.07 MIL/uL — ABNORMAL LOW (ref 4.22–5.81)
RBC: 1.97 MIL/uL — ABNORMAL LOW (ref 4.22–5.81)
RDW: 18.6 % — ABNORMAL HIGH (ref 11.5–15.5)
RDW: 18.5 % — AB (ref 11.5–15.5)
WBC: 2.1 10*3/uL — ABNORMAL LOW (ref 4.0–10.5)
WBC: 2.3 10*3/uL — ABNORMAL LOW (ref 4.0–10.5)

## 2015-06-24 LAB — GLUCOSE, CAPILLARY
GLUCOSE-CAPILLARY: 159 mg/dL — AB (ref 65–99)
GLUCOSE-CAPILLARY: 154 mg/dL — AB (ref 65–99)
GLUCOSE-CAPILLARY: 116 mg/dL — AB (ref 65–99)
GLUCOSE-CAPILLARY: 196 mg/dL — AB (ref 65–99)
Glucose-Capillary: 162 mg/dL — ABNORMAL HIGH (ref 65–99)
Glucose-Capillary: 190 mg/dL — ABNORMAL HIGH (ref 65–99)

## 2015-06-24 LAB — BASIC METABOLIC PANEL
Anion gap: 11 (ref 5–15)
BUN: 22 mg/dL — AB (ref 6–20)
CHLORIDE: 103 mmol/L (ref 101–111)
CO2: 27 mmol/L (ref 22–32)
CREATININE: 4.18 mg/dL — AB (ref 0.61–1.24)
Calcium: 8 mg/dL — ABNORMAL LOW (ref 8.9–10.3)
GFR calc non Af Amer: 13 mL/min — ABNORMAL LOW (ref >60)
GFR, EST AFRICAN AMERICAN: 15 mL/min — AB (ref >60)
GLUCOSE: 146 mg/dL — AB (ref 65–99)
Potassium: 4 mmol/L (ref 3.5–5.1)
Sodium: 141 mmol/L (ref 135–145)

## 2015-06-24 LAB — PREPARE RBC (CROSSMATCH)

## 2015-06-24 MED ORDER — SODIUM CHLORIDE 0.9 % IV SOLN: Freq: Once | INTRAVENOUS | Status: DC

## 2015-06-24 MED ORDER — PANTOPRAZOLE SODIUM 40 MG PO TBEC
40.0000 mg | DELAYED_RELEASE_TABLET | Freq: Two times a day (BID) | ORAL | Status: DC
  Administered 2015-06-24 – 2015-06-28 (×9): 40 mg via ORAL
  Filled 2015-06-24 (×10): qty 1

## 2015-06-24 NOTE — Progress Notes (Signed)
Patient receiving a unit of blood at present. Patient and  family are aware of possible reaction to the blood. No reaction at present post 15 minutes. Family members at bedside.

## 2015-06-24 NOTE — Progress Notes (Signed)
Daily Progress Note   Patient Name: Eddie Thomas       Date: 06/24/2015 DOB: [birth year]  Age: 70 y.o. MRN#: [id number] Attending Physician: Janece Canterbury, MD Primary Care Physician: [name] Admit Date: 06/16/2015  Reason for Consultation/Follow-up: Establishing goals of care, Non pain symptom management, Pain control and Psychosocial/spiritual support  Subjective: Patient's repeat blood work this morning concerning secondary to hemoglobin of 5.1, 2 point drop in 24 hours. His pancytopenia overall appears worse with his white blood cell count at 2.1 and platelets now 42. He did go for dialysis on 06/23/2015, creatinine today 4.18. His vital signs are stable. There is no increased work of breathing. He denies dyspnea. He has not been having any hiccoughs with scheduled Ativan.. Per chart review and staffing with nursing there has been no vomiting of blood but he has had 2 dark melanotic stools. I did call his daughter [name] who unfortunately returned to [place] just last night, to update her  my concerns with this acute drop in hemoglobin. I did recommended she return to [facility]; that my fear was if his clinical condition continues to worsen,  his prognosis could just be hours to days. Patient has multiple antibodies and is very difficult to transfuse. Dr. Sheran Fava in to speak to patient directly about her concerns for his clinical condition. Per our conversation patient looks remarkably well for hemoglobin of only 5.1 and she will be repeating the CBC. He is currently getting platelets transfused and plan is to go forward with blood transfusion if necessary.   Length of Stay: 8  Current Medications: Scheduled Meds:  . sodium chloride   Intravenous Once  . sodium chloride    Intravenous Once  . calcitRIOL  0.25 mcg Oral Q T,Th,Sa-HD  . darbepoetin (ARANESP) injection - DIALYSIS  100 mcg Intravenous Q Tue-HD  . feeding supplement (ENSURE ENLIVE)  237 mL Oral TID BM  . insulin aspart  0-9 Units Subcutaneous Q4H  . LORazepam  0.25 mg Intravenous Q6H  . ondansetron (ZOFRAN) IV  8 mg Intravenous Q6H  . pantoprazole  40 mg Oral BID  . sodium chloride flush  3 mL Intravenous Q12H    Continuous Infusions: . pantoprozole (PROTONIX) infusion 8 mg/hr (06/24/15 0618)    PRN Meds: acetaminophen **OR** acetaminophen, chlorproMAZINE (THORAZINE) IV, diphenoxylate-atropine, HYDROmorphone (DILAUDID) injection,  LORazepam, ondansetron **OR** ondansetron (ZOFRAN) IV, sodium chloride flush  Physical Exam  Constitutional:  Cachetic, frail older man. Opens his eyes to voice and will answer simple questions appropriately  HENT:  Head: Normocephalic and atraumatic.  Neck: Normal range of motion.  Cardiovascular:  irrg  Pulmonary/Chest: Effort normal.  Genitourinary:  foley  Neurological:  somnolent but easily awakens  Skin: Skin is warm and dry.  Psychiatric:  denies feeling too sedated. Reports feeling less anxious  Nursing note and vitals reviewed.           Vital Signs: BP 113/69 mmHg  Pulse 95  Temp(Src) 99.2 F (37.3 C) (Oral)  Resp 16  Ht 5' 11"  (1.803 m)  Wt 60.8 kg (134 lb 0.6 oz)  BMI 18.70 kg/m2  SpO2 100% SpO2: SpO2: 100 % O2 Device: O2 Device: Not Delivered O2 Flow Rate: O2 Flow Rate (L/min): 2 L/min  Intake/output summary:  Intake/Output Summary (Last 24 hours) at 06/24/15 1115 Last data filed at 06/24/15 1042  Gross per 24 hour  Intake 1945.5 ml  Output      0 ml  Net 1945.5 ml   LBM: Last BM Date: 06/24/15 Baseline Weight: Weight: 65 kg (143 lb 4.8 oz) Most recent weight: Weight: 60.8 kg (134 lb 0.6 oz) (Standing scale)       Palliative Assessment/Data:    Flowsheet Rows        Most Recent Value   Intake Tab    Referral  Department  Hospitalist   Unit at Time of Referral  Med/Surg Unit   Palliative Care Primary Diagnosis  Cancer   Date Notified  06/22/15   Palliative Care Type  New Palliative care   Reason for referral  Non-pain Symptom, Clarify Goals of Care, Counsel Regarding Hospice   Date of Admission  06/16/15   Date first seen by Palliative Care  06/23/15   # of days Palliative referral response time  1 Day(s)   # of days IP prior to Palliative referral  6   Clinical Assessment    Palliative Performance Scale Score  40%   Pain Max last 24 hours  Not able to report   Pain Min Last 24 hours  Not able to report   Dyspnea Max Last 24 Hours  Not able to report   Dyspnea Min Last 24 hours  Not able to report   Nausea Max Last 24 Hours  Not able to report   Nausea Min Last 24 Hours  Not able to report   Anxiety Max Last 24 Hours  Not able to report   Anxiety Min Last 24 Hours  Not able to report   Other Max Last 24 Hours  Not able to report   Psychosocial & Spiritual Assessment    Palliative Care Outcomes    Patient/Family meeting held?  Yes   Who was at the meeting?  wife and dtr   Palliative Care Outcomes  Improved non-pain symptom therapy   Patient/Family wishes: Interventions discontinued/not started   Mechanical Ventilation   Palliative Care follow-up planned  Yes, Facility      Patient Active Problem List   Diagnosis Date Noted  . Palliative care encounter   . Hematemesis with nausea   . Dysphagia, pharyngoesophageal phase   . FUO (fever of unknown origin)   . Protein-calorie malnutrition, severe 06/18/2015  . Aspiration pneumonitis ([place]) 06/17/2015  . Pyrexia   . Generalized weakness 06/16/2015  . SIRS (systemic inflammatory response syndrome) ([place]) 06/16/2015  .  ESRD on hemodialysis ([place]) 06/16/2015  . Nausea and vomiting 06/16/2015  . Chest pain   . Altered mental state   . ESRD (end stage renal disease) ([place])   . Fever   . Dehydration 06/01/2015  . Acute on chronic renal  failure ([place]) 06/01/2015  . Hypoalbuminemia due to protein-calorie malnutrition ([place]) 06/01/2015  . Antineoplastic chemotherapy induced pancytopenia ([place]) 06/01/2015  . Long term current use of anticoagulant therapy 06/01/2015  . Acute renal failure (ARF) ([place]) 06/01/2015  . Encounter for antineoplastic chemotherapy 03/05/2015  . Gastroesophageal reflux disease with esophagitis 02/02/2015  . DVT (deep venous thrombosis) ([place]) 07/17/2014  . Antineoplastic chemotherapy induced anemia 06/21/2014  . Hyperbilirubinemia 05/15/2014  . Back pain 12/29/2013  . Bone marrow transplant complication ([place]) 24/40/1027  . Dysphagia, pharyngoesophageal 01/19/2012  . Diabetes mellitus ([place]) 07/03/2011  . Hypertension 07/03/2011  . Hyperlipidemia 07/03/2011  . Multiple myeloma ([place]) 12/02/2010    Palliative Care Assessment & Plan   Patient Profile: Patient is a 70 year old male with a history of multiple myeloma now progressing with associated end-stage renal disease  taking hemodialysis. He continues to bleed, passing dark tarry stools. It is patient and family's hope that his bleeding could stabilize and he continued hemodialysis to prolong his life  Assessment: Patient is resting although he is easily awakened. His vital signs are stable and there is no increased work of breathing. He denies pain. No hiccups since starting scheduled Ativan. Nausea has improved with scheduled Zofran. Multiple family members at the bedside as well as their minister Repeat CBC shows Hgb 5.   Recommendations/Plan:  Continue with scheduled Zofran for intractable nausea  Continue with scheduled Ativan for release of hiccups as well as anxiety  Repeat CBC to assess accuracy of hemoglobin reading of 5.1. Repeat Hgb 5.0  Dr. Sheran Fava spoke to [place] on the phone as well as wife and son and shared her concerns. She also asked for decisions regarding future transfusions as the only blood available for pt going forward after  today are unmatched and thus higher risks adverse event.   Family at this point still undecided regarding hospice, shift to comfort, future transfusions, dialysis. Daughter [name] arriving back in town about 7pm and this could be what they are waiting for   Goals of Care and Additional Recommendations:  Limitations on Scope of Treatment: No Surgical Procedures and No Tracheostomy  Code Status:    Code Status Orders        Start     Ordered   06/16/15 2259  Do not attempt resuscitation (DNR)   Continuous    Question Answer Comment  In the event of cardiac or respiratory ARREST Do not call a "code blue"   In the event of cardiac or respiratory ARREST Do not perform Intubation, CPR, defibrillation or ACLS   In the event of cardiac or respiratory ARREST Use medication by any route, position, wound care, and other measures to relive pain and suffering. May use oxygen, suction and manual treatment of airway obstruction as needed for comfort.      06/16/15 2258    Code Status History    Date Active Date Inactive Code Status Order ID Comments User Context   06/01/2015 12:33 PM 06/15/2015  4:54 PM Full Code [id number]  Kelvin Cellar, MD Inpatient   06/03/2013  1:18 PM 06/04/2013  3:36 AM Full Code [id number]  Markus Daft, MD [place] Directive Documentation        Most Recent Value  Type of Advance Directive  Healthcare Power of Attorney, Living will   Pre-existing out of facility DNR order (yellow form or pink MOST form)     "MOST" Form in Place?         Prognosis:   Hours - Days in the setting of a rapidly advancing multiple myeloma, severe esophagitis with associated bleeding, now with rectal bleeding, thrombocytopenia, decreasing hemoglobin with 2 point drop in 24 hours and now limited options for future transfusions secondary to patient's high antibody count. Patient at high risk for exsanguination  Discharge Planning:  I would not be surprised as rapidly as patient is  declining that he did not survive this hospitalization  Care plan was discussed with Dr. Sheran Fava  Thank you for allowing the Palliative Medicine Team to assist in the care of this patient.   Time In: 1130 Time Out: 1230 Total Time 60 min Prolonged Time Billed  no       Greater than 50%  of this time was spent counseling and coordinating care related to the above assessment and plan. Staffed with Dr. Stephannie Peters, NP  Please contact Palliative Medicine Team phone at [phone] for questions and concerns.

## 2015-06-24 NOTE — Progress Notes (Signed)
Daily Rounding Note  06/24/2015, 9:45 AM  LOS: 8 days   SUBJECTIVE:       2 episodes of dark, red stools last night.   Pt reports to RN that he is not spitting up and hiccups are improved.  Note adjustement to meds by palliative care.  Reglan stopped (due to diarrhea), scheduled zofran and low dose ativan in place.   OBJECTIVE:         Vital signs in last 24 hours:    Temp:  [98 F (36.7 C)-99.2 F (37.3 C)] 99.2 F (37.3 C) (06/04 0853) Pulse Rate:  [95-121] 95 (06/04 0853) Resp:  [16-20] 16 (06/04 0853) BP: (89-136)/(53-71) 113/69 mmHg (06/04 0853) SpO2:  [100 %] 100 % (06/04 0853) Weight:  [60.8 kg (134 lb 0.6 oz)] 60.8 kg (134 lb 0.6 oz) (06/03 1216) Last BM Date: 06/23/15 Filed Weights   06/21/15 1330 06/23/15 0805 06/23/15 1216  Weight: 60.8 kg (134 lb 0.6 oz) 60.9 kg (134 lb 4.2 oz) 60.8 kg (134 lb 0.6 oz)   General: sleeping quietly, no hiccups.  Did not fully examine pt as not wanting to disturb him.   Heart: RRR Chest: breathing unlabored Abdomen: not distended  Extremities: no CCE Neuro/Psych:  Sleeping.    Intake/Output from previous day: 06/03 0701 - 06/04 0700 In: 1825.5 [P.O.:240; I.V.:1207.5; IV Piggyback:378] Out: 0   Intake/Output this shift:    Lab Results:  Recent Labs  06/22/15 0520 06/23/15 0545 06/24/15 0643  WBC 2.8* 2.8* 2.1*  HGB 7.0* 7.4* 5.1*  HCT 22.3* 23.1* 17.0*  PLT 67* 61* 42*   BMET  Recent Labs  06/22/15 0520 06/23/15 0545 06/24/15 0643  NA 140 141 141  K 3.9 3.8 4.0  CL 100* 103 103  CO2 27 25 27   GLUCOSE 122* 154* 146*  BUN 23* 44* 22*  CREATININE 4.36* 6.68* 4.18*  CALCIUM 8.2* 8.6* 8.0*   LFT No results for input(s): PROT, ALBUMIN, AST, ALT, ALKPHOS, BILITOT, BILIDIR, IBILI in the last 72 hours. PT/INR  Recent Labs  06/21/15 1150 06/23/15 0545  LABPROT 16.9* 14.9  INR 1.36 1.15   Hepatitis Panel No results for input(s): HEPBSAG, HCVAB,  HEPAIGM, HEPBIGM in the last 72 hours.  Studies/Results: No results found.   Scheduled Meds: . sodium chloride   Intravenous Once  . sodium chloride   Intravenous Once  . calcitRIOL  0.25 mcg Oral Q T,Th,Sa-HD  . darbepoetin (ARANESP) injection - DIALYSIS  100 mcg Intravenous Q Tue-HD  . feeding supplement (ENSURE ENLIVE)  237 mL Oral TID BM  . insulin aspart  0-9 Units Subcutaneous Q4H  . LORazepam  0.25 mg Intravenous Q6H  . ondansetron (ZOFRAN) IV  8 mg Intravenous Q6H  . sodium chloride flush  3 mL Intravenous Q12H   Continuous Infusions: . pantoprozole (PROTONIX) infusion 8 mg/hr (06/24/15 0618)   PRN Meds:.acetaminophen **OR** acetaminophen, chlorproMAZINE (THORAZINE) IV, diphenoxylate-atropine, HYDROmorphone (DILAUDID) injection, LORazepam, ondansetron **OR** ondansetron (ZOFRAN) IV, sodium chloride flush   ASSESMENT:   * Nausea, vomiting, pinkish bloody emesis, hiccups: all improvingand nearly resolved.   On schedule Zofran IV, Ativan scheduled, .   Repeat EGD of 6/1: LA class grade D, hemorrhagic esophagitis with blood clot and oozing in distal esophagus. Mild, distal esophageal stricture and small HH. Biopsies obtained, path pending.  *  Dark, bloody stool.  EGD as above.  Colonoscopy 06/2011: Hyperplastic colon polyp, poor prep, no diverticulosis noted.   *  Acute on chronic anemia.  Hgb early this AM shows > 2 gm drop over 24 hours. Getting Aranesp weekly.    *  Thrombocytopenia.  Progressive.   * Advanced MM. Dr Earlie Server is recommending palliative care sees little utility to continuing chemo.  * ESRD.  *  DNR.      PLAN   *  Repeat CBC ordered.   *  BID Protonix.     Eddie Thomas  06/24/2015, 9:45 AM Pager: [phone]     Attending physician's note   I have taken an interval history, reviewed the chart and examined the patient. I agree with the Advanced Practitioner's note, impression and recommendations.  UGI symptoms have all improved. DC IV  PPI infusion, start PPI po bid. Continue scheduled Zofran and scheduled Ativan for ongoing control of hiccups, nausea, regurg.  Dark stools, Hb has decreased to 5.1 and platelets have decreased to 42k. I don't feel any further endoscopic interventions will be helpful. Platelet transfusion in progress. May need PRBC transfusion today. Encouraged him to transition to palliative care if bleeding persists. Limited options with advanced MM and ESRD.   Eddie Edward, MD Eddie Thomas [phone] Mon-Fri 8a-5p [phone] after 5p, weekends, holidays

## 2015-06-24 NOTE — Progress Notes (Signed)
[facility] Progress Note  Assessment: 1  Melena/ acute on chron anemia - Hb 5's, on darbe 100, got 2 unit prbc last night, 3 more prbc ordered for today. Vol status is stable for now.  2  Nausea/ vomiting/ hiccups - severe esophagitis by EGD, on PPI rx, biospy pending 3  ESRD - TTS HD no hep 4  Secondary hyperparathyroidism - calcitriol 0.25 q HD; renvela 800 1 ac 5  HTN - prn IV MTP, BP's down today, no po meds 6  Nutrition - alb 2.5 added Resource - ok for CL per GI 7  MM - refractory to multiple regimens, sp SCT in 2012, last regimen Velcade/daratumumab with last dose early-mid April 8  DM - per primary 9 EOL - discussions w pall care and family ongoing , appreciate pall care/ oncology/ prim team/ GI efforts.   Plan - HD Tuesday, sooner if needed  Kelly Splinter MD [place] pager [phone]    cell [phone] 06/24/2015, 2:42 PM   Subjective:   Dark stools overnight, Hb dropped, get two unit prbc's overnight and one unit plts.    Objective Filed Vitals:   06/24/15 0759 06/24/15 0816 06/24/15 0853 06/24/15 1141  BP: 136/63 125/61 113/69 127/59  Pulse: 104 100 95 102  Temp: 98.4 F (36.9 C) 98.2 F (36.8 C) 99.2 F (37.3 C) 98.1 F (36.7 C)  TempSrc: Oral Oral Oral Oral  Resp: 17 16 16 16   Height:      Weight:      SpO2: 100% 100% 100% 100%   Physical Exam General: ill appearing sitting in chair, calm no distress, pleasant Heart:  RRR Lungs: no rales Abdomen: soft ntnd Extremities: no edema Dialysis Access: right IJ - left upper AVF + bruit  DIalysis:  TTS [place]  4h  62.5kg  3/2.25 bath  Heparin none  R IJ cath/ L BC AVG (placed 06/08/15, Dr Kellie Simmering) Hect 1 ug   Mirc 200 q 2    Labs: Basic Metabolic Panel:  Recent Labs Lab 06/19/15 1006  06/22/15 0520 06/23/15 0545 06/24/15 0643  NA 135  < > 140 141 141  K 3.8  < > 3.9 3.8 4.0  CL 96*  < > 100* 103 103  CO2 26  < > 27 25 27   GLUCOSE 120*  < > 122* 154* 146*  BUN 13   < > 23* 44* 22*  CREATININE 3.70*  < > 4.36* 6.68* 4.18*  CALCIUM 8.0*  < > 8.2* 8.6* 8.0*  PHOS 1.7*  --   --   --   --   < > = values in this interval not displayed. Liver Function Tests:  Recent Labs Lab 06/19/15 1006  ALBUMIN 2.2*   CBC:  Recent Labs Lab 06/19/15 0412  06/21/15 0450 06/22/15 0520 06/23/15 0545 06/24/15 0643 06/24/15 1030  WBC 3.5*  < > 3.4* 2.8* 2.8* 2.1* 2.3*  NEUTROABS 3.0  --   --   --   --   --   --   HGB 7.2*  < > 8.0* 7.0* 7.4* 5.1* 5.0*  HCT 22.9*  < > 24.9* 22.3* 23.1* 17.0* 15.8*  MCV 82.1  < > 80.6 81.4 78.3 82.1 80.2  PLT 73*  < > 83* 67* 61* 42* 47*  < > = values in this interval not displayed. Blood Culture    Component Value Date/Time   SDES BLOOD RIGHT HAND 06/16/2015 2230   SPECREQUEST IN PEDIATRIC BOTTLE 1CC 06/16/2015  [name] 5 DAYS 06/16/2015 2230   REPTSTATUS 06/21/2015 FINAL 06/16/2015 2230    Cardiac Enzymes:  Recent Labs Lab 06/18/15 1840 06/18/15 2315 06/19/15 0412  TROPONINI 0.04* 0.03 0.05*   CBG:  Recent Labs Lab 06/23/15 2042 06/24/15 0008 06/24/15 0424 06/24/15 0758 06/24/15 1132  GLUCAP 133* 162* 159* 154* 116*    Medications:   . sodium chloride   Intravenous Once  . sodium chloride   Intravenous Once  . calcitRIOL  0.25 mcg Oral Q T,Th,Sa-HD  . darbepoetin (ARANESP) injection - DIALYSIS  100 mcg Intravenous Q Tue-HD  . feeding supplement (ENSURE ENLIVE)  237 mL Oral TID BM  . insulin aspart  0-9 Units Subcutaneous Q4H  . LORazepam  0.25 mg Intravenous Q6H  . ondansetron (ZOFRAN) IV  8 mg Intravenous Q6H  . pantoprazole  40 mg Oral BID  . sodium chloride flush  3 mL Intravenous Q12H

## 2015-06-24 NOTE — Progress Notes (Signed)
PT Cancellation Note  Patient Details Name: Eddie Thomas MRN: [id number] DOB: 1945-03-13   Cancelled Treatment:    Reason Eval/Treat Not Completed: Medical issues which prohibited therapy   Hgb 5.1;   Will follow up later today as time allows;  Otherwise, will follow up for PT tomorrow;   Thank you,  Roney Marion, [place] Pager [phone] Office [phone]     Roney Marion [facility] 06/24/2015, 2:51 PM

## 2015-06-24 NOTE — Progress Notes (Signed)
CRITICAL VALUE ALERT  Critical value received:  Hgb = 5.1  Date of notification:  06-24-2015  Time of notification:  07:16  Critical value read back:Yes.    Nurse who received alert:  Genelle Bal, RN  MD notified (1st page):  Short  Time of first page:  7:17     MD notified (2nd page):  Time of second page:  Responding MD:  Short  Time MD responded:  7:26

## 2015-06-24 NOTE — Progress Notes (Signed)
PROGRESS NOTE  Eddie Thomas  UXN:[id number] DOB: 08-Jun-1945 DOA: 06/16/2015 PCP: [name]  Brief Narrative:  70 y.o. male with medical history significant of multiple myeloma, IgA subtype, candidal esophagitis with stricture dilated in late April by Dr. Fuller Plan, ESRD, hypertension, diabetes mellitus, hx of DVT no longer on anticoagulation due to bleeding complications.  He was recently hospitalized in Mid-May for acute renal failure likely secondary to progressive multiple myeloma.  He was started on dialysis and deemed ESRD.  He was discharged, but presented a few days later with fever, nausea and vomiting.  He likely had aspiration pneumonia and may have some malignancy related fevers superimposed.  He has had rapidly worsening anemia requiring more frequent blood transfusions.  Blood transfusions are complicated by his multiple myeloma and by the medications he has received to treat his myeloma which may finding matching blood difficult (takes several days to acquire blood).  Although his myeloma is contributing to his pancytopenia, his anemia is further worsened by his hematemesis.  He underwent EGD on 6/1 which demonstrated hemorrhagic esophagitis.  Sitting upright and using PPI have improved his hemorrhage.  Due to his progressive cancer, prolonged hospitalizations, and refractory nausea/vomiting, he has become cachectic and frail with severe protein calorie malnutrition.  His prognosis is poor.  Oncology and palliative care are following and recommending hospice care.  Patient and family are considering their options.    Assessment & Plan:   Principal Problem:   SIRS (systemic inflammatory response syndrome) (HCC) Active Problems:   Multiple myeloma (HCC)   Diabetes mellitus (HCC)   Antineoplastic chemotherapy induced anemia   Hypoalbuminemia due to protein-calorie malnutrition (HCC)   Generalized weakness   ESRD on hemodialysis (HCC)   Nausea and vomiting   Aspiration  pneumonitis (HCC)   Pyrexia   Protein-calorie malnutrition, severe   FUO (fever of unknown origin)   Hematemesis with nausea   Dysphagia, pharyngoesophageal phase   Palliative care encounter  SIRS/Fever, suspect aspiration pneumonia given his frequent hematemesis vs neoplastic fever from widespread myeloma.  Completed 8-days of treatment for aspiration pneumonia -  Fevers improved.  Anemia of neoplastic disease and acute blood loss.  Having melena now and hemoglobin dropping more rapidly.  Received two units Multiple antibodies related to his myeloma medications make finding compatible blood difficult.  Hemoglobin dropping more rapidly now.   -  Transfused 7 units so far over last month -  Transfuse the 2 units two days ago without appropriate rise in hemoglobin -  Hgb dropped 2.5 mg/dl in last 24 hours -  Transfuse 2 units of remaining matched blood that we have in house -  Blood bank does not have any more matched blood and so will have to use not fully compatible blood, therefore, higher risk of transfusion reaction.  Blood will arrive from [facility] possibly this evening.  Discussed increased risk of transfusion reaction with patient and family.     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  Hematemesis improving but having melena now.   - EGD on 6/1 found hemorrhagic esophagitis - pathology from 6/1 esophageal biopsies pending - continue full liquid diet and do not advance - continue IV PPI  - Appreciate GI assistance   Multiple Myeloma, progressing with worsening anemia, new ESRD, rising B2 microglobulin, multiple lytic lesions - refractory to multiple chemo regimens and SCT in 2012 - Dr. Julien Nordmann had family meeting on 6/2  with patient, wife, and daughter [name] and recommended hospice care - Palliative care assistance appreciated  Acute toxic and metabolic encephalopathy, resolved, likely  multifactorial including infection, medications, worsening anemia -06/12/15 CT brain negative -ammonia--24,  -vitamin B12--266 > started supplementation -TSH--0.493, Free T4--1.04 -Discontinued Thorazine, baclofen, opioids, gabapentin, other hypnotics which helped  Atypical Chest Pain, likely due to esophageal stricture and dilation -VQ scan neg -EKG-no concerning ischemic changes -troponins minimally elevated due to ESRD  Pancytopenia - Secondary to multiple myeloma and related cancer chemotherapy.  NSVT: - Patient had 15 beats of nonsustained V. tach, hypomagnesemia corrected,  - 2-D echo: Normal EF - d/c metoprolol due to hypotension  History of DVT - Xarelto DC'ed given renal function (took Xarelto approximately one year) - Not good anticoagulation candidate due to bleeding complications.   Essential hypertension, blood pressures decreasing and intermittently hypotensive - ACEI stopped due to acute renal failure.  - d/c IV metoprolol for now - consider small IVF bolus  Diabetes mellitus type 2 -CBGs remained largely controlled -novolog sliding scale for now  Severe protein calorie malnutrition - continue full liquid diet - continue supplements  DVT prophylaxis:  heparin Code Status:  DNR Family Communication:  Patient, daughter [name] by phone.  Son, wife, and grandchild in room.   Disposition Plan:  Recommending hospice care.  Anticipate we will be unable to continue blood transfusions without risking transfusion reaction and cancer is progressing.  May have in-hospital death.  Family still undecided about plan of care.    Consultants:   GI  Nephrology  Vascular surgery  Oncology  Palliative care  Procedures:  none  Antimicrobials:   Zosyn   Augmentin  Unasyn    Subjective: Having melena.  Nausea and hiccoughs are better and hematemesis improved.  Denies shortness of breath, but feels that his thinking is clouded.  Denies lightheadedness.     Objective: Filed Vitals:   06/24/15 1141 06/24/15 1525 06/24/15 1554 06/24/15 1620  BP: 127/59 118/64 121/64   Pulse: 102 99 93   Temp: 98.1 F (36.7 C) 98.3 F (36.8 C) 99.4 F (37.4 C) 99 F (37.2 C)  TempSrc: Oral Oral Oral Oral  Resp: _0 Height:      Weight:      SpO2: 100% 100% 100%     Intake/Output Summary (Last 24 hours) at 06/24/15 1659 Last data filed at 06/24/15 1456  Gross per 24 hour  Intake 2435.5 ml  Output      0 ml  Net 2435.5 ml   Filed Weights   06/21/15 1330 06/23/15 0805 06/23/15 1216  Weight: 60.8 kg (134 lb 0.6 oz) 60.9 kg (134 lb 4.2 oz) 60.8 kg (134 lb 0.6 oz)    Examination:  General exam:  Cachectic, pale, ill-appearing adult male.  No acute distress.  HEENT:  NCAT, MMM Respiratory system:  CTAB Cardiovascular system: tachycardic, regular rhythm.  2/6 systolic murmurs.  Warm extremities Gastrointestinal system: Normal active bowel sounds, soft, nondistended, nontender MSK:  Normal tone and bulk, no lower extremity edema Neuro:  Grossly moves all extremities.  Sleepier today    Data Reviewed: I have personally reviewed following labs and imaging studies  CBC:  Recent Labs Lab 06/19/15 0412  06/21/15 0450 06/22/15 0520 06/23/15 0545 06/24/15 0643 06/24/15 1030  WBC 3.5*  < > 3.4* 2.8* 2.8* 2.1* 2.3*  NEUTROABS 3.0  --   --   --   --   --   --   HGB 7.2*  < >  8.0* 7.0* 7.4* 5.1* 5.0*  HCT 22.9*  < > 24.9* 22.3* 23.1* 17.0* 15.8*  MCV 82.1  < > 80.6 81.4 78.3 82.1 80.2  PLT 73*  < > 83* 67* 61* 42* 47*  < > = values in this interval not displayed. Basic Metabolic Panel:  Recent Labs Lab 06/19/15 1006 06/21/15 0450 06/22/15 0520 06/23/15 0545 06/24/15 0643  NA 135 139 140 141 141  K 3.8 4.3 3.9 3.8 4.0  CL 96* 100* 100* 103 103  CO2 _0 GLUCOSE 120* 124* 122* 154* 146*  BUN 13 43* 23* 44* 22*  CREATININE 3.70* 8.41* 4.36* 6.68* 4.18*  CALCIUM 8.0* 8.2* 8.2* 8.6* 8.0*  PHOS 1.7*  --   --   --    --    GFR: Estimated Creatinine Clearance: 14.3 mL/min (by C-G formula based on Cr of 4.18). Liver Function Tests:  Recent Labs Lab 06/19/15 1006  ALBUMIN 2.2*   No results for input(s): LIPASE, AMYLASE in the last 168 hours. No results for input(s): AMMONIA in the last 168 hours. Coagulation Profile:  Recent Labs Lab 06/21/15 1150 06/23/15 0545  INR 1.36 1.15   Cardiac Enzymes:  Recent Labs Lab 06/18/15 1840 06/18/15 2315 06/19/15 0412  TROPONINI 0.04* 0.03 0.05*   BNP (last 3 results) No results for input(s): PROBNP in the last 8760 hours. HbA1C: No results for input(s): HGBA1C in the last 72 hours. CBG:  Recent Labs Lab 06/24/15 0008 06/24/15 0424 06/24/15 0758 06/24/15 1132 06/24/15 1538  GLUCAP 162* 159* 154* 116* 196*   Lipid Profile: No results for input(s): CHOL, HDL, LDLCALC, TRIG, CHOLHDL, LDLDIRECT in the last 72 hours. Thyroid Function Tests: No results for input(s): TSH, T4TOTAL, FREET4, T3FREE, THYROIDAB in the last 72 hours. Anemia Panel: No results for input(s): VITAMINB12, FOLATE, FERRITIN, TIBC, IRON, RETICCTPCT in the last 72 hours. Urine analysis:    Component Value Date/Time   COLORURINE YELLOW 06/01/2015 [place] 06/01/2015 1540   LABSPEC 1.008 06/01/2015 1540   PHURINE 6.0 06/01/2015 1540   GLUCOSEU NEGATIVE 06/01/2015 1540   HGBUR TRACE* 06/01/2015 1540   BILIRUBINUR NEGATIVE 06/01/2015 1540   KETONESUR NEGATIVE 06/01/2015 1540   PROTEINUR 30* 06/01/2015 1540   UROBILINOGEN 1.0 01/16/2012 1902   NITRITE NEGATIVE 06/01/2015 1540   LEUKOCYTESUR NEGATIVE 06/01/2015 1540   Sepsis Labs: _1 (procalcitonin:4,lacticidven:4)  ) Recent Results (from the past 240 hour(s))  Blood culture (routine x 2)     Status: None   Collection Time: 06/16/15 10:11 PM  Result Value Ref Range Status   Specimen Description BLOOD RIGHT ANTECUBITAL  Final   Special Requests BOTTLES DRAWN AEROBIC AND ANAEROBIC 5CC   Final    Culture NO GROWTH 5 DAYS  Final   Report Status 06/21/2015 FINAL  Final  Blood culture (routine x 2)     Status: None   Collection Time: 06/16/15 10:30 PM  Result Value Ref Range Status   Specimen Description BLOOD RIGHT HAND  Final   Special Requests IN PEDIATRIC BOTTLE [place]  Final   Culture NO GROWTH 5 DAYS  Final   Report Status 06/21/2015 FINAL  Final      Radiology Studies: No results found.   Scheduled Meds: . sodium chloride   Intravenous Once  . sodium chloride   Intravenous Once  . calcitRIOL  0.25 mcg Oral Q T,Th,Sa-HD  . darbepoetin (ARANESP) injection - DIALYSIS  100 mcg Intravenous Q Tue-HD  . feeding supplement (ENSURE ENLIVE)  237 mL Oral TID BM  . insulin aspart  0-9 Units Subcutaneous Q4H  . LORazepam  0.25 mg Intravenous Q6H  . ondansetron (ZOFRAN) IV  8 mg Intravenous Q6H  . pantoprazole  40 mg Oral BID  . sodium chloride flush  3 mL Intravenous Q12H   Continuous Infusions:     LOS: 8 days    Time spent: 30 min    Janece Canterbury, MD [facility] Pager [phone]  If 7PM-7AM, please contact night-coverage [email or web address] Password [email or web address] 06/24/2015, 4:59 PM

## 2015-06-24 NOTE — Progress Notes (Signed)
   06/24/15 1900  Clinical Encounter Type  Visited With Patient and family together  Visit Type Spiritual support  Referral From Nurse  Spiritual Encounters  Spiritual Needs Prayer;Emotional  Stress Factors  Patient Stress Factors Health changes  Family Stress Factors Health changes;Major life changes  Chaplain responded to page from nurse.  Chaplain arrived to room and was met by patient and family.  Patient requested prayer.  All of the family was not present yet so the son went to get the sister so that all could be present. When all the family arrived, Chaplain offered prayer to family and patient.  Chaplain also offered ministry of presence.    Eddie Thomas Molley Houser  06/24/2015  8:01 PM

## 2015-06-25 DIAGNOSIS — Z7189 Other specified counseling: Secondary | ICD-10-CM | POA: Insufficient documentation

## 2015-06-25 DIAGNOSIS — G4700 Insomnia, unspecified: Secondary | ICD-10-CM

## 2015-06-25 LAB — GLUCOSE, CAPILLARY
GLUCOSE-CAPILLARY: 101 mg/dL — AB (ref 65–99)
GLUCOSE-CAPILLARY: 107 mg/dL — AB (ref 65–99)
GLUCOSE-CAPILLARY: 115 mg/dL — AB (ref 65–99)
GLUCOSE-CAPILLARY: 134 mg/dL — AB (ref 65–99)
GLUCOSE-CAPILLARY: 195 mg/dL — AB (ref 65–99)
GLUCOSE-CAPILLARY: 90 mg/dL (ref 65–99)

## 2015-06-25 LAB — PREPARE PLATELET PHERESIS: UNIT DIVISION: 0

## 2015-06-25 LAB — BASIC METABOLIC PANEL
ANION GAP: 9 (ref 5–15)
BUN: 28 mg/dL — ABNORMAL HIGH (ref 6–20)
CALCIUM: 8 mg/dL — AB (ref 8.9–10.3)
CO2: 26 mmol/L (ref 22–32)
CREATININE: 6.13 mg/dL — AB (ref 0.61–1.24)
Chloride: 101 mmol/L (ref 101–111)
GFR calc non Af Amer: 8 mL/min — ABNORMAL LOW (ref >60)
GFR, EST AFRICAN AMERICAN: 10 mL/min — AB (ref >60)
Glucose, Bld: 87 mg/dL (ref 65–99)
Potassium: 3.9 mmol/L (ref 3.5–5.1)
SODIUM: 136 mmol/L (ref 135–145)

## 2015-06-25 LAB — CBC
HEMATOCRIT: 22.3 % — AB (ref 39.0–52.0)
HEMOGLOBIN: 7.4 g/dL — AB (ref 13.0–17.0)
MCH: 25.1 pg — ABNORMAL LOW (ref 26.0–34.0)
MCHC: 31.8 g/dL (ref 30.0–36.0)
MCV: 78.8 fL (ref 78.0–100.0)
Platelets: 48 10*3/uL — ABNORMAL LOW (ref 150–400)
RBC: 2.83 MIL/uL — ABNORMAL LOW (ref 4.22–5.81)
RDW: 18 % — AB (ref 11.5–15.5)
WBC: 2.7 10*3/uL — AB (ref 4.0–10.5)

## 2015-06-25 MED ORDER — LORAZEPAM 2 MG/ML IJ SOLN
1.0000 mg | Freq: Every evening | INTRAMUSCULAR | Status: DC | PRN
  Administered 2015-06-25 – 2015-06-27 (×3): 1 mg via INTRAVENOUS
  Filled 2015-06-25 (×3): qty 1

## 2015-06-25 MED ORDER — DIPHENOXYLATE-ATROPINE 2.5-0.025 MG/5ML PO LIQD
5.0000 mL | Freq: Four times a day (QID) | ORAL | Status: DC
  Administered 2015-06-25 – 2015-06-28 (×9): 5 mL via ORAL
  Filled 2015-06-25 (×9): qty 5

## 2015-06-25 NOTE — Progress Notes (Signed)
PROGRESS NOTE  Eddie Thomas  QVZ:[id number] DOB: [birth year] DOA: 06/16/2015 PCP: [name]  Brief Narrative:  70 y.o. male with medical history significant of multiple myeloma, IgA subtype, candidal esophagitis with stricture dilated in late April by Dr. Fuller Plan, ESRD, hypertension, diabetes mellitus, hx of DVT no longer on anticoagulation due to bleeding complications.  He was recently hospitalized in Mid-May for acute renal failure likely secondary to progressive multiple myeloma.  He was started on dialysis and deemed ESRD.  He was discharged, but presented a few days later with fever, nausea and vomiting.  He likely had aspiration pneumonia and may have some malignancy related fevers superimposed.  He has had rapidly worsening anemia requiring more frequent blood transfusions.  Blood transfusions are complicated by his multiple myeloma and by the medications he has received to treat his myeloma which may finding matching blood difficult (takes several days to acquire blood).  Although his myeloma is contributing to his pancytopenia, his anemia is further worsened by his hematemesis.  He underwent EGD on 6/1 which demonstrated hemorrhagic esophagitis.  Sitting upright and using PPI have improved his hemorrhage.  Due to his progressive cancer, prolonged hospitalizations, and refractory nausea/vomiting, he has become cachectic and frail with severe protein calorie malnutrition.  His prognosis is poor.  Oncology and palliative care are following and recommending hospice care.  Patient and family are considering their options.    Assessment & Plan:   Principal Problem:   SIRS (systemic inflammatory response syndrome) (HCC) Active Problems:   Multiple myeloma (HCC)   Diabetes mellitus (HCC)   Antineoplastic chemotherapy induced anemia   Hypoalbuminemia due to protein-calorie malnutrition (HCC)   Generalized weakness   ESRD on hemodialysis (HCC)   Nausea and vomiting   Aspiration  pneumonitis (HCC)   Pyrexia   Protein-calorie malnutrition, severe   FUO (fever of unknown origin)   Hematemesis with nausea   Dysphagia, pharyngoesophageal phase   Palliative care encounter   Goals of care, counseling/discussion   Insomnia  SIRS/Fever, suspect aspiration pneumonia given his frequent hematemesis vs neoplastic fever from widespread myeloma.  Completed 8-days of treatment for aspiration pneumonia -  Fevers improved.  Anemia of neoplastic disease and acute blood loss.  Having melena now and hemoglobin dropping more rapidly.  Received two units Multiple antibodies related to his myeloma medications make finding compatible blood difficult.  Hemoglobin dropping more rapidly now.   -  Transfused 10 units so far over last month -  Using not fully compatible blood, therefore, higher risk of transfusion reaction.  Increased risk of transfusion reaction discussed with patient and family.   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  Hematemesis improving, melena slowing but still with blood in stools  - EGD on 6/1 found hemorrhagic esophagitis - pathology from 6/1 esophageal biopsies with noninfectious ulceration, no malignancy detected - continue full liquid diet and do not advance - continue IV PPI  - Appreciate GI assistance   Multiple Myeloma, progressing with worsening anemia, new ESRD, rising B2 microglobulin, multiple lytic lesions - refractory to multiple chemo regimens and SCT in 2012 - Dr. Julien Nordmann had family meeting on 6/2 with patient, wife, and daughter [name] and recommended hospice care - Palliative care assistance appreciated  Acute toxic and metabolic encephalopathy, resolved, likely multifactorial including infection, medications,  worsening anemia -06/12/15 CT brain negative -ammonia--24,    -vitamin B12--266 > started supplementation -TSH--0.493, Free T4--1.04 -Discontinued Thorazine, baclofen, opioids, gabapentin, other hypnotics  Atypical Chest Pain, likely due to esophageal stricture and dilation -VQ scan neg -EKG-no concerning ischemic changes -troponins minimally elevated due to ESRD  Pancytopenia - Secondary to multiple myeloma and related cancer chemotherapy.  NSVT: - Patient had 15 beats of nonsustained V. tach, hypomagnesemia corrected,  - 2-D echo: Normal EF - d/c'd metoprolol due to hypotension  History of DVT - Xarelto DC'ed given renal function (took Xarelto approximately one year) - Not good anticoagulation candidate due to bleeding complications.   Essential hypertension, blood pressures decreasing and intermittently hypotensive - ACEI stopped due to acute renal failure.  - d/c IV metoprolol for now  Diabetes mellitus type 2, CBG well-controlled, requiring minimal insulin and at risk for hypoglycemia - continue BID CBG -  D/c SSI for now unless CBG routinely > 160  Severe protein calorie malnutrition - continue full liquid diet - continue supplements  DVT prophylaxis:  heparin Code Status:  DNR Family Communication:  Patient, daughter [name] by phone.  Son, wife, and grandchild in room.   Disposition Plan:  Recommending hospice care.  Anticipate we will be unable to continue blood transfusions without risking transfusion reaction and cancer is progressing.  May have in-hospital death.  Family still undecided about plan of care.    Consultants:   GI  Nephrology  Vascular surgery  Oncology  Palliative care  Procedures:  none  Antimicrobials:   Zosyn   Augmentin  Unasyn    Subjective: Nausea and hiccoughs are better, but continuing to have watery stools.  Wants to go home for a few days.   Objective: Filed Vitals:   06/25/15 0321 06/25/15 0433 06/25/15 0740 06/25/15 1708  BP: 124/64 100/48 115/53 117/52  Pulse: 84 73 77  85  Temp: 98.5 F (36.9 C) 98.4 F (36.9 C) 98.1 F (36.7 C) 99.8 F (37.7 C)  TempSrc: Oral Oral Oral Oral  Resp: _0 Height:      Weight:      SpO2: 100% 100% 100% 100%    Intake/Output Summary (Last 24 hours) at 06/25/15 2000 Last data filed at 06/25/15 1856  Gross per 24 hour  Intake 1143.58 ml  Output      0 ml  Net 1143.58 ml   Filed Weights   06/21/15 1330 06/23/15 0805 06/23/15 1216  Weight: 60.8 kg (134 lb 0.6 oz) 60.9 kg (134 lb 4.2 oz) 60.8 kg (134 lb 0.6 oz)    Examination:  General exam:  Cachectic, pale, ill-appearing adult male.  No acute distress.  HEENT:  NCAT, MMM Respiratory system:  CTAB Cardiovascular system: tachycardic, regular rhythm.  2/6 systolic murmurs.  Warm extremities Gastrointestinal system: Normal active bowel sounds, soft, nondistended, nontender MSK:  Decreased tone and bulk, no lower extremity edema Neuro:  Grossly moves all extremities.     Data Reviewed: I have personally reviewed following labs and imaging studies  CBC:  Recent Labs Lab 06/19/15 0412  06/22/15 0520 06/23/15 0545 06/24/15 0643 06/24/15 1030 06/25/15 0548  WBC 3.5*  < > 2.8* 2.8* 2.1* 2.3* 2.7*  NEUTROABS 3.0  --   --   --   --   --   --   HGB 7.2*  < > 7.0* 7.4* 5.1* 5.0* 7.4*  HCT 22.9*  < > 22.3* 23.1* 17.0* 15.8* 22.3*  MCV 82.1  < > 81.4  78.3 82.1 80.2 78.8  PLT 73*  < > 67* 61* 42* 47* 48*  < > = values in this interval not displayed. Basic Metabolic Panel:  Recent Labs Lab 06/19/15 1006 06/21/15 0450 06/22/15 0520 06/23/15 0545 06/24/15 0643 06/25/15 0548  NA 135 139 140 141 141 136  K 3.8 4.3 3.9 3.8 4.0 3.9  CL 96* 100* 100* 103 103 101  CO2 _0 GLUCOSE 120* 124* 122* 154* 146* 87  BUN 13 43* 23* 44* 22* 28*  CREATININE 3.70* 8.41* 4.36* 6.68* 4.18* 6.13*  CALCIUM 8.0* 8.2* 8.2* 8.6* 8.0* 8.0*  PHOS 1.7*  --   --   --   --   --    GFR: Estimated Creatinine Clearance: 9.8 mL/min (by C-G formula based on Cr  of 6.13). Liver Function Tests:  Recent Labs Lab 06/19/15 1006  ALBUMIN 2.2*   No results for input(s): LIPASE, AMYLASE in the last 168 hours. No results for input(s): AMMONIA in the last 168 hours. Coagulation Profile:  Recent Labs Lab 06/21/15 1150 06/23/15 0545  INR 1.36 1.15   Cardiac Enzymes:  Recent Labs Lab 06/18/15 2315 06/19/15 0412  TROPONINI 0.03 0.05*   BNP (last 3 results) No results for input(s): PROBNP in the last 8760 hours. HbA1C: No results for input(s): HGBA1C in the last 72 hours. CBG:  Recent Labs Lab 06/25/15 0010 06/25/15 0424 06/25/15 0739 06/25/15 1138 06/25/15 1707  GLUCAP 195* 90 107* 134* 101*   Lipid Profile: No results for input(s): CHOL, HDL, LDLCALC, TRIG, CHOLHDL, LDLDIRECT in the last 72 hours. Thyroid Function Tests: No results for input(s): TSH, T4TOTAL, FREET4, T3FREE, THYROIDAB in the last 72 hours. Anemia Panel: No results for input(s): VITAMINB12, FOLATE, FERRITIN, TIBC, IRON, RETICCTPCT in the last 72 hours. Urine analysis:    Component Value Date/Time   COLORURINE YELLOW 06/01/2015 [place] 06/01/2015 1540   LABSPEC 1.008 06/01/2015 1540   PHURINE 6.0 06/01/2015 1540   GLUCOSEU NEGATIVE 06/01/2015 1540   HGBUR TRACE* 06/01/2015 1540   BILIRUBINUR NEGATIVE 06/01/2015 1540   KETONESUR NEGATIVE 06/01/2015 1540   PROTEINUR 30* 06/01/2015 1540   UROBILINOGEN 1.0 01/16/2012 1902   NITRITE NEGATIVE 06/01/2015 1540   LEUKOCYTESUR NEGATIVE 06/01/2015 1540   Sepsis Labs: _1 (procalcitonin:4,lacticidven:4)  ) Recent Results (from the past 240 hour(s))  Blood culture (routine x 2)     Status: None   Collection Time: 06/16/15 10:11 PM  Result Value Ref Range Status   Specimen Description BLOOD RIGHT ANTECUBITAL  Final   Special Requests BOTTLES DRAWN AEROBIC AND ANAEROBIC 5CC   Final   Culture NO GROWTH 5 DAYS  Final   Report Status 06/21/2015 FINAL  Final  Blood culture (routine x 2)      Status: None   Collection Time: 06/16/15 10:30 PM  Result Value Ref Range Status   Specimen Description BLOOD RIGHT HAND  Final   Special Requests IN PEDIATRIC BOTTLE [place]  Final   Culture NO GROWTH 5 DAYS  Final   Report Status 06/21/2015 FINAL  Final      Radiology Studies: No results found.   Scheduled Meds: . sodium chloride   Intravenous Once  . sodium chloride   Intravenous Once  . calcitRIOL  0.25 mcg Oral Q T,Th,Sa-HD  . darbepoetin (ARANESP) injection - DIALYSIS  100 mcg Intravenous Q Tue-HD  . diphenoxylate-atropine  5 mL Oral QID  . feeding supplement (ENSURE ENLIVE)  237 mL Oral TID  BM  . insulin aspart  0-9 Units Subcutaneous Q4H  . LORazepam  1 mg Intravenous QHS,MR X 1  . ondansetron (ZOFRAN) IV  8 mg Intravenous Q6H  . pantoprazole  40 mg Oral BID  . sodium chloride flush  3 mL Intravenous Q12H   Continuous Infusions:     LOS: 9 days    Time spent: 30 min    Janece Canterbury, MD [facility] Pager [phone]  If 7PM-7AM, please contact night-coverage [email or web address] Password [email or web address] 06/25/2015, 8:00 PM

## 2015-06-25 NOTE — Progress Notes (Signed)
PT Cancellation Note  Patient Details Name: SALEM LASKOSKI MRN: [id number] DOB: [birth year]   Cancelled Treatment:    Reason Eval/Treat Not Completed: Other (comment)   Discussed pt with Wells Guiles, RN, and Dr. Sheran Fava;  At this point PT's role is not congruent with Goals of Care;  Will sign off;   Consider hospital bed with air overlay and ambulance transport if for dc home;   Thank you,  Roney Marion, PT  Acute [facility] Services Pager [phone] Office [phone]'    [place] 06/25/2015, 3:08 PM

## 2015-06-25 NOTE — Progress Notes (Signed)
[facility] Progress Note   Subjective: "I'm doing all right". Patient alert, high fowler's position in bed in NAD. Daughter at bedside.  No tarry/bloody stools this AM per RN report.      Objective Filed Vitals:   06/25/15 0102 06/25/15 0321 06/25/15 0433 06/25/15 0740  BP: 126/68 124/64 100/48 115/53  Pulse: 80 84 73 77  Temp: 98.4 F (36.9 C) 98.5 F (36.9 C) 98.4 F (36.9 C) 98.1 F (36.7 C)  TempSrc: Oral Oral Oral Oral  Resp: 18 20 18 17   Height:      Weight:      SpO2: 100% 100% 100% 100%   Physical Exam General: Thin, frail appearing male, pleasant NAD Heart: S1,S2, RRR Lungs: bilateral breath sounds CTA A/P Abdomen: soft, non-distended, hyperactive BS Extremities: Trace ankle edema.  Dialysis Access: right IJ, drsg CDI. Maturing LUA AVF + bruit  DIalysis: TTS [place] 4h 62.5kg 3/2.25 bath Heparin none R IJ cath/ L BC AVG (placed 06/08/15, Dr Kellie Simmering) Hect 1 ug  Mirc 200 q 2   Additional Objective Labs: Basic Metabolic Panel:  Recent Labs Lab 06/19/15 1006  06/23/15 0545 06/24/15 0643 06/25/15 0548  NA 135  < > 141 141 136  K 3.8  < > 3.8 4.0 3.9  CL 96*  < > 103 103 101  CO2 26  < > 25 27 26   GLUCOSE 120*  < > 154* 146* 87  BUN 13  < > 44* 22* 28*  CREATININE 3.70*  < > 6.68* 4.18* 6.13*  CALCIUM 8.0*  < > 8.6* 8.0* 8.0*  PHOS 1.7*  --   --   --   --   < > = values in this interval not displayed. Liver Function Tests:  Recent Labs Lab 06/19/15 1006  ALBUMIN 2.2*   CBC:  Recent Labs Lab 06/19/15 0412  06/22/15 0520 06/23/15 0545 06/24/15 0643 06/24/15 1030 06/25/15 0548  WBC 3.5*  < > 2.8* 2.8* 2.1* 2.3* 2.7*  NEUTROABS 3.0  --   --   --   --   --   --   HGB 7.2*  < > 7.0* 7.4* 5.1* 5.0* 7.4*  HCT 22.9*  < > 22.3* 23.1* 17.0* 15.8* 22.3*  MCV 82.1  < > 81.4 78.3 82.1 80.2 78.8  PLT 73*  < > 67* 61* 42* 47* 48*  < > = values in this interval not displayed. Blood Culture    Component Value Date/Time   SDES BLOOD RIGHT HAND 06/16/2015 2230   SPECREQUEST IN PEDIATRIC BOTTLE 1CC 06/16/2015 2230   CULT NO GROWTH 5 DAYS 06/16/2015 2230   REPTSTATUS 06/21/2015 FINAL 06/16/2015 2230    Cardiac Enzymes:  Recent Labs Lab 06/18/15 1840 06/18/15 2315 06/19/15 0412  TROPONINI 0.04* 0.03 0.05*   CBG:  Recent Labs Lab 06/24/15 1538 06/24/15 1941 06/25/15 0010 06/25/15 0424 06/25/15 0739  GLUCAP 196* 190* 195* 90 107*   Studies/Results: No results found. Medications:   . sodium chloride   Intravenous Once  . sodium chloride   Intravenous Once  . calcitRIOL  0.25 mcg Oral Q T,Th,Sa-HD  . darbepoetin (ARANESP) injection - DIALYSIS  100 mcg Intravenous Q Tue-HD  . feeding supplement (ENSURE ENLIVE)  237 mL Oral TID BM  . insulin aspart  0-9 Units Subcutaneous Q4H  . LORazepam  0.25 mg Intravenous Q6H  . ondansetron (ZOFRAN) IV  8 mg Intravenous Q6H  . pantoprazole  40 mg Oral BID  . sodium chloride flush  3 mL Intravenous Q12H     Assessment/Plan: 1 Melena/ acute on chron anemia - Hgb now 7.4 after 3 units PRBCs, 1 unit FFP. Follow HGB.  2 Nausea/ vomiting/ hiccups - severe esophagitis by EGD, on PPI rx, biospy pending 3 ESRD - TTS HD no hep. Will have HD on schedule tomorrow.  4 Secondary hyperparathyroidism - calcitriol 0.25 q HD; no binders Ca 8.0 C Ca 9.44 5 HTN - SBP stable in 100-120s. Last HD 06/23/15 Pre wt 60.9 kg Net UF 500 Post wt 60.8 kg. Recheck wt, 0.5-1 kg tomorrow.  6 Nutrition - alb 2.5 added Resource - ok for CL per GI 7 MM - refractory to multiple regimens, sp SCT in 2012, last regimen Velcade/daratumumab with last dose early-mid April 8 DM - per primary 9 EOL - discussions w pall care and family ongoing , appreciate pall care/ oncology/ prim team/ GI efforts.   Trenita Hulme H. Kati Riggenbach NP-C 06/25/2015, 10:00 AM  [employer] [phone]

## 2015-06-25 NOTE — Progress Notes (Signed)
Daily Progress Note   Patient Name: Eddie Thomas       Date: 06/25/2015 DOB: [birth year]  Age: 70 y.o. MRN#: [id number] Attending Physician: Eddie Canterbury, MD Primary Care Physician: Eddie Thomas Admit Date: 06/16/2015  Reason for Consultation/Follow-up: Establishing goals of care and Terminal Care  Subjective: Met with patient and daughter to discuss goals of care and end of life care. Daughter tearful, unsure of end of life course. Knowledgeable of the fact that patient is actively bleeding, passing blood in stool, however, blood transfusions are difficult due to the fact that patient has had repeated blood transfusions and has many antibodies. Likelihood of transfusion reaction from unmatched blood is high. Patient states his goal is to "make it to 70". Denies pain, nausea, SOB. Passing blood in stool. Some discomfort in throat, mostly when swallowing saliva. Eddie Thomas is very religious, has met with chaplain and has support from his local church. He has a living will and has indicated DNR status, DNI, no artificial life-prolonging measures. Per Daughter, patient would like to go home, at least for a day. Daughter states patient is not interested in [place]. Daughter is concerned about bringing patient home and "watching him die".   Length of Stay: 9  Current Medications: Scheduled Meds:  . sodium chloride   Intravenous Once  . sodium chloride   Intravenous Once  . calcitRIOL  0.25 mcg Oral Q T,Th,Sa-HD  . darbepoetin (ARANESP) injection - DIALYSIS  100 mcg Intravenous Q Tue-HD  . feeding supplement (ENSURE ENLIVE)  237 mL Oral TID BM  . insulin aspart  0-9 Units Subcutaneous Q4H  . LORazepam  0.25 mg Intravenous Q6H  . ondansetron (ZOFRAN) IV  8 mg Intravenous Q6H  .  pantoprazole  40 mg Oral BID  . sodium chloride flush  3 mL Intravenous Q12H        PRN Meds: acetaminophen **OR** acetaminophen, chlorproMAZINE (THORAZINE) IV, diphenoxylate-atropine, HYDROmorphone (DILAUDID) injection, LORazepam, ondansetron **OR** ondansetron (ZOFRAN) IV, sodium chloride flush  Physical Exam    General: Cachexic, black male, appears stated age Respiratory: Negative for dyspnea, tachypnea Musc: able to walk from bed to bathroom without assistance GI: incontinent, abdomen non-distended Psych: quiet, tearful, flat affect at times   Vital Signs: BP 115/53 mmHg  Pulse 77  Temp(Src) 98.1 F (36.7 C) (Oral)  Resp 17  Ht 5' 11"  (1.803 m)  Wt 60.8 kg (134 lb 0.6 oz)  BMI 18.70 kg/m2  SpO2 100% SpO2: SpO2: 100 % O2 Device: O2 Device: Not Delivered O2 Flow Rate: O2 Flow Rate (L/min): 2 L/min  Intake/output summary:  Intake/Output Summary (Last 24 hours) at 06/25/15 1104 Last data filed at 06/25/15 1036  Gross per 24 hour  Intake 2088.58 ml  Output      0 ml  Net 2088.58 ml   LBM: Last BM Date: 06/24/15 Baseline Weight: Weight: 65 kg (143 lb 4.8 oz) Most recent weight: Weight: 60.8 kg (134 lb 0.6 oz) (Standing scale)       Palliative Assessment/Data:    Flowsheet Rows        Most Recent Value   Intake Tab    Referral Department  Hospitalist   Unit at Time of Referral  Med/Surg Unit   Palliative Care Primary Diagnosis  Cancer   Date Notified  06/22/15   Palliative Care Type  New Palliative care   Reason for referral  Non-pain Symptom, Clarify Goals of Care, Counsel Regarding Hospice   Date of Admission  06/16/15   Date first seen by Palliative Care  06/23/15   # of days Palliative referral response time  1 Day(s)   # of days IP prior to Palliative referral  6   Clinical Assessment    Palliative Performance Scale Score  40%   Pain Max last 24 hours  Not able to report   Pain Min Last 24 hours  Not able to report   Dyspnea Max Last 24 Hours  Not  able to report   Dyspnea Min Last 24 hours  Not able to report   Nausea Max Last 24 Hours  Not able to report   Nausea Min Last 24 Hours  Not able to report   Anxiety Max Last 24 Hours  Not able to report   Anxiety Min Last 24 Hours  Not able to report   Other Max Last 24 Hours  Not able to report   Psychosocial & Spiritual Assessment    Palliative Care Outcomes    Patient/Family meeting held?  Yes   Who was at the meeting?  wife and dtr   Palliative Care Outcomes  Improved non-pain symptom therapy   Patient/Family wishes: Interventions discontinued/not started   Mechanical Ventilation   Palliative Care follow-up planned  Yes, Facility      Patient Active Problem List   Diagnosis Date Noted  . Palliative care encounter   . Hematemesis with nausea   . Dysphagia, pharyngoesophageal phase   . FUO (fever of unknown origin)   . Protein-calorie malnutrition, severe 06/18/2015  . Aspiration pneumonitis ([place]) 06/17/2015  . Pyrexia   . Generalized weakness 06/16/2015  . SIRS (systemic inflammatory response syndrome) ([place]) 06/16/2015  . ESRD on hemodialysis ([place]) 06/16/2015  . Nausea and vomiting 06/16/2015  . Chest pain   . Altered mental state   . ESRD (end stage renal disease) ([place])   . Fever   . Dehydration 06/01/2015  . Acute on chronic renal failure ([place]) 06/01/2015  . Hypoalbuminemia due to protein-calorie malnutrition ([place]) 06/01/2015  . Antineoplastic chemotherapy induced pancytopenia ([place]) 06/01/2015  . Long term current use of anticoagulant therapy 06/01/2015  . Acute renal failure (ARF) ([place]) 06/01/2015  . Encounter for antineoplastic chemotherapy 03/05/2015  . Gastroesophageal reflux disease with esophagitis 02/02/2015  . DVT (deep venous thrombosis) ([place]) 07/17/2014  . Antineoplastic chemotherapy induced anemia  06/21/2014  . Hyperbilirubinemia 05/15/2014  . Back pain 12/29/2013  . Bone marrow transplant complication ([place]) 62/83/6629  . Dysphagia, pharyngoesophageal  01/19/2012  . Diabetes mellitus ([place]) 07/03/2011  . Hypertension 07/03/2011  . Hyperlipidemia 07/03/2011  . Multiple myeloma ([place]) 12/02/2010    Palliative Care Assessment & Plan   Patient Profile: 70 yo male with progressive multiple myeloma, now with; end stage kidney failure requiring dialysis, multiple blood transfusions which are now complicated by antibodies, erosive esophagitis, and GI bleeding. DNR, DNI in place. Daughter and wife are primary decision makers.  Assessment:  - Multiple Myeloma- end stage, terminal with prognosis indicated of days to week.  - Erosive esophagitis- severe, causing patient pain and bleeding - Anemia- CBC has improved slightly, Hgb up to 7.4 today -End of Life planning- HCPOA, DNR, Living will in place -Insomnia/Difficulty sleeping   Recommendations/Plan:  Family meeting planned today for end of life planning and clarification of GOC. Patient would like to return home for at least one day and this appears to be a reasonable request. Will discuss arrangements further at family meeting as well as further clarify [place].   Offered Carafate for erosive esophagitis, however, patient has tried this in past and declines at the moment.  Will prescribe sleep aid  Goals of Care and Additional Recommendations:  Limitations on Scope of Treatment: No Artificial Feeding  Code Status:    Code Status Orders        Start     Ordered   06/16/15 2259  Do not attempt resuscitation (DNR)   Continuous    Question Answer Comment  In the event of cardiac or respiratory ARREST Do not call a "code blue"   In the event of cardiac or respiratory ARREST Do not perform Intubation, CPR, defibrillation or ACLS   In the event of cardiac or respiratory ARREST Use medication by any route, position, wound care, and other measures to relive pain and suffering. May use oxygen, suction and manual treatment of airway obstruction as needed for comfort.      06/16/15 2258      Code Status History    Date Active Date Inactive Code Status Order ID Comments User Context   06/01/2015 12:33 PM 06/15/2015  4:54 PM Full Code [id number]  Kelvin Cellar, MD Inpatient   06/03/2013  1:18 PM 06/04/2013  3:36 AM Full Code [id number]  Markus Daft, MD HOV    Advance Directive Documentation        Most Recent Value   Type of Advance Directive  Healthcare Power of Attorney, Living will   Pre-existing out of facility DNR order (yellow form or pink MOST form)     "MOST" Form in Place?         Prognosis:   Hours - Days due to end stage MM, now with kidney failure and active bleeding  Discharge Planning:  Anticipated Hospital Death  Care plan was discussed with daughter and patient. Will be further discussed with spouse, daughter, and son to clarify [place] and make determinations regarding future interventions and possibly initiation of comfort care.  Thank you for allowing the Palliative Medicine Team to assist in the care of this patient.   Time In: 1015 Time Out: 1100 Total Time 45 min Prolonged Time Billed  No      Greater than 50%  of this time was spent counseling and coordinating care related to the above assessment and plan.  Mariana Kaufman, AGNP-C   [facility], [place]  Please contact  Palliative Medicine Team phone at [phone] for questions and concerns.

## 2015-06-25 NOTE — Care Management Important Message (Signed)
Important Message  Patient Details  Name: Eddie Thomas MRN: [id number] Date of Birth: Feb 28, 1945   Medicare Important Message Given:  Yes    Loann Quill 06/25/2015, 12:08 PM

## 2015-06-26 DIAGNOSIS — Z7189 Other specified counseling: Secondary | ICD-10-CM | POA: Insufficient documentation

## 2015-06-26 LAB — BASIC METABOLIC PANEL
Anion gap: 11 (ref 5–15)
BUN: 34 mg/dL — AB (ref 6–20)
CHLORIDE: 102 mmol/L (ref 101–111)
CO2: 23 mmol/L (ref 22–32)
CREATININE: 7.69 mg/dL — AB (ref 0.61–1.24)
Calcium: 8 mg/dL — ABNORMAL LOW (ref 8.9–10.3)
GFR calc Af Amer: 7 mL/min — ABNORMAL LOW (ref >60)
GFR calc non Af Amer: 6 mL/min — ABNORMAL LOW (ref >60)
GLUCOSE: 124 mg/dL — AB (ref 65–99)
POTASSIUM: 4.3 mmol/L (ref 3.5–5.1)
Sodium: 136 mmol/L (ref 135–145)

## 2015-06-26 LAB — PREPARE RBC (CROSSMATCH)

## 2015-06-26 LAB — CBC
HCT: 23.6 % — ABNORMAL LOW (ref 39.0–52.0)
HEMOGLOBIN: 7.6 g/dL — AB (ref 13.0–17.0)
MCH: 25.7 pg — AB (ref 26.0–34.0)
MCHC: 32.2 g/dL (ref 30.0–36.0)
MCV: 79.7 fL (ref 78.0–100.0)
Platelets: 63 10*3/uL — ABNORMAL LOW (ref 150–400)
RBC: 2.96 MIL/uL — AB (ref 4.22–5.81)
RDW: 18.4 % — ABNORMAL HIGH (ref 11.5–15.5)
WBC: 2.7 10*3/uL — ABNORMAL LOW (ref 4.0–10.5)

## 2015-06-26 LAB — GLUCOSE, CAPILLARY
GLUCOSE-CAPILLARY: 84 mg/dL (ref 65–99)
GLUCOSE-CAPILLARY: 145 mg/dL — AB (ref 65–99)

## 2015-06-26 MED ORDER — LIDOCAINE HCL (PF) 1 % IJ SOLN: 5.0000 mL | INTRAMUSCULAR | Status: DC | PRN

## 2015-06-26 MED ORDER — LIDOCAINE-PRILOCAINE 2.5-2.5 % EX CREA: 1.0000 "application " | TOPICAL_CREAM | CUTANEOUS | Status: DC | PRN

## 2015-06-26 MED ORDER — SODIUM CHLORIDE 0.9 % IV SOLN: Freq: Once | INTRAVENOUS | Status: DC

## 2015-06-26 MED ORDER — SODIUM CHLORIDE 0.9 % IV SOLN: 100.0000 mL | INTRAVENOUS | Status: DC | PRN

## 2015-06-26 MED ORDER — ALTEPLASE 2 MG IJ SOLR: 2.0000 mg | Freq: Once | INTRAMUSCULAR | Status: DC | PRN

## 2015-06-26 MED ORDER — DARBEPOETIN ALFA 100 MCG/0.5ML IJ SOSY
PREFILLED_SYRINGE | INTRAMUSCULAR | Status: AC
  Administered 2015-06-26: 100 ug via INTRAVENOUS
  Filled 2015-06-26: qty 0.5

## 2015-06-26 MED ORDER — HEPARIN SODIUM (PORCINE) 1000 UNIT/ML DIALYSIS: 1000.0000 [IU] | INTRAMUSCULAR | Status: DC | PRN

## 2015-06-26 MED ORDER — SODIUM CHLORIDE 0.9 % IV SOLN
100.0000 mL | INTRAVENOUS | Status: DC | PRN
  Filled 2015-06-26: qty 100

## 2015-06-26 MED ORDER — PENTAFLUOROPROP-TETRAFLUOROETH EX AERO: 1.0000 "application " | INHALATION_SPRAY | CUTANEOUS | Status: DC | PRN

## 2015-06-26 NOTE — Procedures (Signed)
I was present at this dialysis session. I have reviewed the session itself and made appropriate changes.   [place] conversations ongoing.  3K bath.  UF goal < 1L.  Hb stable.    Recent Labs Lab 06/19/15 1006  06/26/15 0525  NA 135  < > 136  K 3.8  < > 4.3  CL 96*  < > 102  CO2 26  < > 23  GLUCOSE 120*  < > 124*  BUN 13  < > 34*  CREATININE 3.70*  < > 7.69*  CALCIUM 8.0*  < > 8.0*  PHOS 1.7*  --   --   < > = values in this interval not displayed.   Recent Labs Lab 06/24/15 1030 06/25/15 0548 06/26/15 0525  WBC 2.3* 2.7* 2.7*  HGB 5.0* 7.4* 7.6*  HCT 15.8* 22.3* 23.6*  MCV 80.2 78.8 79.7  PLT 47* 48* 63*    Scheduled Meds: . sodium chloride   Intravenous Once  . sodium chloride   Intravenous Once  . calcitRIOL  0.25 mcg Oral Q T,Th,Sa-HD  . Darbepoetin Alfa      . darbepoetin (ARANESP) injection - DIALYSIS  100 mcg Intravenous Q Tue-HD  . diphenoxylate-atropine  5 mL Oral QID  . feeding supplement (ENSURE ENLIVE)  237 mL Oral TID BM  . LORazepam  1 mg Intravenous QHS,MR X 1  . ondansetron (ZOFRAN) IV  8 mg Intravenous Q6H  . pantoprazole  40 mg Oral BID  . sodium chloride flush  3 mL Intravenous Q12H   Continuous Infusions:  PRN Meds:.sodium chloride, sodium chloride, acetaminophen **OR** acetaminophen, alteplase, chlorproMAZINE (THORAZINE) IV, heparin, HYDROmorphone (DILAUDID) injection, lidocaine (PF), lidocaine-prilocaine, LORazepam, ondansetron **OR** ondansetron (ZOFRAN) IV, pentafluoroprop-tetrafluoroeth, sodium chloride flush   Pearson Grippe  MD 06/26/2015, 8:47 AM

## 2015-06-26 NOTE — Progress Notes (Signed)
Hemodialysis= Per NP and Dr. Sheran Fava, would rather wait for compatible blood to arrive before transfusing. Communicated this to blood bank and primary RN.

## 2015-06-26 NOTE — Progress Notes (Signed)
Hemodialysis- Treatment completed. Total UF 1.4L. 2 units PRBCs ordered for pt on HD. Not ready in time d/t extensive screening process. Will notify primary RN. Additional fluid removed to allow for transfusion. Report called to primary RN. Pt has no complaints

## 2015-06-26 NOTE — Progress Notes (Signed)
Spiritual Care Note  Brought card, prayer shawl, and warm greetings from the [facility] team.  Provided pastoral presence and reflective listening as Eddie Thomas shared and processed where he is now physically and emotionally.  Provided emotional support to dtr, son, and wife as well, praying with family at bedside per request.  Normalized feelings, serving as witness to their stories and their love.  Per Eddie Thomas's permission, will share his greetings, love, and appreciation with Multiple Myeloma Support [facility], one of his communities of encouragement and mutual support at [facility].  Encouraged pt/family to reach out at any time if they would like to talk further.  Plan to f/u with phone call home to [place] and/or his wife, which they welcome.  Please also page if needs arise/circumstances change.  Thank you.  [place], [place], [place] pager [phone] [facility] 24/7 pager [phone]

## 2015-06-26 NOTE — Progress Notes (Signed)
Daily Progress Note   Patient Name: Eddie Thomas       Date: 06/26/2015 DOB: 1945/02/09  Age: 70 y.o. MRN#: [id number] Attending Physician: Janece Canterbury, MD Primary Care Physician: [name] Admit Date: 06/16/2015  Reason for Consultation/Follow-up: Establishing goals of care and Terminal Care  Subjective: Met with Patient, wife, son and daughter at bedside.  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  We discussed that death from MM often comes with a bad bleed or a heart arrhythmia.  We discussed that death will likely come in days to 1 week after blood transfusions and hemodialysis is stopped.  The family understands that their best option is to engage hospice services.  They also understand that one hospice is engaged there will be no further transfusions or hemodialysis.  As Eddie Thomas looks so good, is eating and able to walk - making the decision to stop HD and transfusions is an Extremely hard one.  To complicate matters, his wife had a recent fall and head trauma.  She is suffering with a closed head injury.  After speaking with Dr. Sheran Fava, we agreed to engage hospice.  Allow tomorrow for hospice to arrange services in the [facility] and to plan to discharge Eddie Thomas on Thursday after HD barring any unforseen complications.  I relayed this information to his daughter, Eddie Thomas and she is discussing it with her brother.  I will follow up with the [facility]orman family again tomorrow 6/7   Length of Stay: 10  Current Medications: Scheduled Meds:  . sodium chloride   Intravenous Once  . sodium chloride   Intravenous Once  . sodium chloride   Intravenous Once  . calcitRIOL  0.25 mcg Oral Q T,Th,Sa-HD  . darbepoetin (ARANESP)  injection - DIALYSIS  100 mcg Intravenous Q Tue-HD  . diphenoxylate-atropine  5 mL Oral QID  . feeding supplement (ENSURE ENLIVE)  237 mL Oral TID BM  . LORazepam  1 mg Intravenous QHS,MR X 1  . ondansetron (ZOFRAN) IV  8 mg Intravenous Q6H  . pantoprazole  40 mg Oral BID  . sodium chloride flush  3 mL Intravenous Q12H        PRN Meds: acetaminophen **OR** acetaminophen, chlorproMAZINE (THORAZINE) IV, HYDROmorphone (DILAUDID) injection, LORazepam, ondansetron **OR** ondansetron (ZOFRAN) IV, sodium chloride  flush  Physical Exam    General: Cachexic, black male, appears younger than stated age.  Family at bedside. Respiratory: CTA, no distress. Musc: Moves all 4 extremities GI: incontinent, abdomen non-distended Psych: quiet, tearful   Vital Signs: BP 118/53 mmHg  Pulse 88  Temp(Src) 98.3 F (36.8 C) (Oral)  Resp 17  Ht 5' 11"  (1.803 m)  Wt 64.9 kg (143 lb 1.3 oz)  BMI 19.96 kg/m2  SpO2 100% SpO2: SpO2: 100 % O2 Device: O2 Device: Not Delivered O2 Flow Rate: O2 Flow Rate (L/min): 2 L/min  Intake/output summary:   Intake/Output Summary (Last 24 hours) at 06/26/15 1628 Last data filed at 06/26/15 1302  Gross per 24 hour  Intake    390 ml  Output   1403 ml  Net  -1013 ml   LBM: Last BM Date: 06/25/15 Baseline Weight: Weight: 65 kg (143 lb 4.8 oz) Most recent weight: Weight: 64.9 kg (143 lb 1.3 oz)       Palliative Assessment/Data:    Flowsheet Rows        Most Recent Value   Intake Tab    Referral Department  Hospitalist   Unit at Time of Referral  Med/Surg Unit   Palliative Care Primary Diagnosis  Cancer   Date Notified  06/22/15   Palliative Care Type  New Palliative care   Reason for referral  Non-pain Symptom, Clarify Goals of Care, Counsel Regarding Hospice   Date of Admission  06/16/15   Date first seen by Palliative Care  06/23/15   # of days Palliative referral response time  1 Day(s)   # of days IP prior to Palliative referral  6   Clinical  Assessment    Palliative Performance Scale Score  40%   Pain Max last 24 hours  Not able to report   Pain Min Last 24 hours  Not able to report   Dyspnea Max Last 24 Hours  Not able to report   Dyspnea Min Last 24 hours  Not able to report   Nausea Max Last 24 Hours  Not able to report   Nausea Min Last 24 Hours  Not able to report   Anxiety Max Last 24 Hours  Not able to report   Anxiety Min Last 24 Hours  Not able to report   Other Max Last 24 Hours  Not able to report   Psychosocial & Spiritual Assessment    Palliative Care Outcomes    Patient/Family meeting held?  Yes   Who was at the meeting?  wife and dtr   Palliative Care Outcomes  Improved non-pain symptom therapy   Patient/Family wishes: Interventions discontinued/not started   Mechanical Ventilation   Palliative Care follow-up planned  Yes, Facility      Patient Active Problem List   Diagnosis Date Noted  . Goals of care, counseling/discussion   . Insomnia   . Palliative care encounter   . Hematemesis with nausea   . Dysphagia, pharyngoesophageal phase   . FUO (fever of unknown origin)   . Protein-calorie malnutrition, severe 06/18/2015  . Aspiration pneumonitis ([place]) 06/17/2015  . Pyrexia   . Generalized weakness 06/16/2015  . SIRS (systemic inflammatory response syndrome) ([place]) 06/16/2015  . ESRD on hemodialysis ([place]) 06/16/2015  . Nausea and vomiting 06/16/2015  . Chest pain   . Altered mental state   . ESRD (end stage renal disease) ([place])   . Fever   . Dehydration 06/01/2015  . Acute on chronic renal failure (HCC)  06/01/2015  . Hypoalbuminemia due to protein-calorie malnutrition ([place]) 06/01/2015  . Antineoplastic chemotherapy induced pancytopenia ([place]) 06/01/2015  . Long term current use of anticoagulant therapy 06/01/2015  . Acute renal failure (ARF) ([place]) 06/01/2015  . Encounter for antineoplastic chemotherapy 03/05/2015  . Gastroesophageal reflux disease with esophagitis 02/02/2015  . DVT (deep venous  thrombosis) ([place]) 07/17/2014  . Antineoplastic chemotherapy induced anemia 06/21/2014  . Hyperbilirubinemia 05/15/2014  . Back pain 12/29/2013  . Bone marrow transplant complication ([place]) 34/74/2595  . Dysphagia, pharyngoesophageal 01/19/2012  . Diabetes mellitus ([place]) 07/03/2011  . Hypertension 07/03/2011  . Hyperlipidemia 07/03/2011  . Multiple myeloma ([place]) 12/02/2010    Palliative Care Assessment & Plan   Patient Profile: 70 yo male with progressive multiple myeloma, now with; end stage kidney failure requiring dialysis, multiple blood transfusions which are now complicated by antibodies, erosive esophagitis, and GI bleeding. DNR, DNI in place. Daughter and wife are primary decision makers.  Assessment:  - Multiple Myeloma- end stage, terminal with prognosis indicated of days to week.  - Erosive esophagitis- severe, causing patient pain and bleeding - Anemia- patient has required 15 units of blood in the last 6 weeks and will receive 2 more units this evening. -End of Life planning- HCPOA, DNR, Living will in place.  Case management order for Hospice. -Insomnia/Difficulty sleeping   Recommendations/Plan:  Engage Hospice.  Family wants strongly to take him home with Hospice.   My best advice that they utilize [facility] - particularly as Mr. Neels is at high risk for GI bleeding.      Ativan for sleep.  Goals of Care and Additional Recommendations:  Limitations on Scope of Treatment: No Artificial Feeding  Code Status:DNR    Code Status Orders        Start     Ordered   06/16/15 2259  Do not attempt resuscitation (DNR)   Continuous    Question Answer Comment  In the event of cardiac or respiratory ARREST Do not call a "code blue"   In the event of cardiac or respiratory ARREST Do not perform Intubation, CPR, defibrillation or ACLS   In the event of cardiac or respiratory ARREST Use medication by any route, position, wound care, and other measures to relive pain  and suffering. May use oxygen, suction and manual treatment of airway obstruction as needed for comfort.      06/16/15 2258    Code Status History    Date Active Date Inactive Code Status Order ID Comments User Context   06/01/2015 12:33 PM 06/15/2015  4:54 PM Full Code [id number]  Kelvin Cellar, MD Inpatient   06/03/2013  1:18 PM 06/04/2013  3:36 AM Full Code [id number]  Markus Daft, MD HOV    Advance Directive Documentation        Most Recent Value   Type of Advance Directive  Healthcare Power of Attorney, Living will   Pre-existing out of facility DNR order (yellow form or pink MOST form)     "MOST" Form in Place?         Prognosis:   Hours - Days due to end stage MM, now with kidney failure and active bleeding  Discharge Planning:  [place] vs Home with Hospice.  Care plan was discussed with daughter and patient. Will be further discussed with spouse, daughter, and son to clarify [place] and make determinations regarding future interventions and possibly initiation of comfort care.  Thank you for allowing the Palliative Medicine Team to assist in the care  of this patient.   Time In: 2:45 Time Out: 4:30 Total Time 105 Prolonged Time Billed  No      Greater than 50%  of this time was spent counseling and coordinating care related to the above assessment and plan.  Imogene Burn, PA-C Palliative Medicine Pager: [phone]   Please contact Palliative Medicine Team phone at [phone] for questions and concerns.

## 2015-06-26 NOTE — Progress Notes (Signed)
PROGRESS NOTE  Eddie Thomas  IAX:[id number] DOB: 10/09/1945 DOA: 06/16/2015 PCP: [name]  Brief Narrative:  70 y.o. male with medical history significant of multiple myeloma, IgA subtype, candidal esophagitis with stricture dilated in late April by Dr. Fuller Plan, ESRD, hypertension, diabetes mellitus, hx of DVT no longer on anticoagulation due to bleeding complications.  He was recently hospitalized in Mid-May for acute renal failure likely secondary to progressive multiple myeloma.  He was started on dialysis and deemed ESRD.  He was discharged, but presented a few days later with fever, nausea and vomiting.  He likely had aspiration pneumonia and may have some malignancy related fevers superimposed.  He has had rapidly worsening anemia requiring more frequent blood transfusions.  Blood transfusions are complicated by his multiple myeloma and by the medications he has received to treat his myeloma which may finding matching blood difficult (takes several days to acquire blood).  Although his myeloma is contributing to his pancytopenia, his anemia is further worsened by his hematemesis.  He underwent EGD on 6/1 which demonstrated hemorrhagic esophagitis.  Sitting upright and using PPI have improved his hemorrhage.  Due to his progressive cancer, prolonged hospitalizations, and refractory nausea/vomiting, he has become cachectic and frail with severe protein calorie malnutrition.  His prognosis is poor.  Oncology and palliative care are following and recommending hospice care.  Patient and family are considering their options.    Assessment & Plan:   Principal Problem:   SIRS (systemic inflammatory response syndrome) (HCC) Active Problems:   Multiple myeloma (HCC)   Diabetes mellitus (HCC)   Antineoplastic chemotherapy induced anemia   Hypoalbuminemia due to protein-calorie malnutrition (HCC)   Generalized weakness   ESRD on hemodialysis (HCC)   Nausea and vomiting   Aspiration  pneumonitis (HCC)   Pyrexia   Protein-calorie malnutrition, severe   FUO (fever of unknown origin)   Hematemesis with nausea   Dysphagia, pharyngoesophageal phase   Palliative care encounter   Goals of care, counseling/discussion   Insomnia   Encounter for hospice care discussion  SIRS/Fever, suspect aspiration pneumonia given his frequent hematemesis vs neoplastic fever from widespread myeloma.  Completed 8-days of treatment for aspiration pneumonia -  Fevers improved.  Anemia of neoplastic disease and acute blood loss.  Having melena now and hemoglobin dropping more rapidly.  Received two units Multiple antibodies related to his myeloma medications make finding compatible blood difficult.  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  Hematemesis improving, melena slowing but still with blood in stools  - EGD on 6/1 found hemorrhagic esophagitis - pathology from 6/1 esophageal biopsies with noninfectious ulceration, no malignancy detected - continue full liquid diet and do not advance - continue IV PPI  - Appreciate GI assistance   Multiple Myeloma, progressing with worsening anemia, new ESRD, rising B2 microglobulin, multiple lytic lesions - refractory to multiple chemo regimens and SCT in 2012 - Dr. Julien Nordmann had family meeting on 6/2 with patient, wife, and daughter Eddie Thomas and recommended hospice care - Palliative care assistance appreciated  Acute toxic and metabolic encephalopathy, resolved, likely multifactorial including infection, medications, worsening anemia -06/12/15 CT brain negative -ammonia--24,  -vitamin B12--266 > started supplementation -TSH--0.493, Free T4--1.04 -Discontinued Thorazine, baclofen, opioids, gabapentin,  other hypnotics  Atypical Chest Pain,  likely due to esophageal stricture and dilation -VQ scan neg -EKG-no concerning ischemic changes -troponins minimally elevated due to ESRD  Pancytopenia - Secondary to multiple myeloma and related cancer chemotherapy.  NSVT: - Patient had 15 beats of nonsustained V. tach, hypomagnesemia corrected,  - 2-D echo: Normal EF - d/c'd metoprolol due to hypotension  History of DVT - Xarelto DC'ed given renal function (took Xarelto approximately one year) - Not good anticoagulation candidate due to bleeding complications.   Essential hypertension, blood pressures decreasing and intermittently hypotensive - ACEI stopped due to acute renal failure.   Diabetes mellitus type 2, CBG well-controlled, requiring minimal insulin and at risk for hypoglycemia - continue BID CBG -  D/c SSI for now unless CBG routinely > 160  Severe protein calorie malnutrition - continue full liquid diet - continue supplements  DVT prophylaxis:  heparin Code Status:  DNR Family Communication:  Patient during dialysis and privately with daughter Eddie Thomas Disposition Plan:  Recommending hospice care.  Anticipate we will be unable to continue blood transfusions without risking transfusion reaction and cancer is progressing.  There may be a window right now where he can go home.  Plan for transfusion tonight, starting enrollment in hospice with delivery of equipment Wednesday.  Would like be ready for discharge to home after HD on Thursday or sometime on Friday.   Consultants:   GI  Nephrology  Vascular surgery  Oncology  Palliative care  Procedures:  none  Antimicrobials:   Zosyn   Augmentin  Unasyn    Subjective: Nausea and hiccoughs are better, diarrhea slowing down.  Wants to go home for a few days.   Objective: Filed Vitals:   06/26/15 1052 06/26/15 1142 06/26/15 1300 06/26/15 1655  BP: 127/63 118/64 118/53 121/51  Pulse: 83 86 88 89  Temp:  97 F  (36.1 C) 98.3 F (36.8 C) 98.6 F (37 C)  TempSrc:  Oral Oral Oral  Resp:  16 17 18   Height:      Weight:  64.9 kg (143 lb 1.3 oz)    SpO2:  99% 100% 100%    Intake/Output Summary (Last 24 hours) at 06/26/15 1839 Last data filed at 06/26/15 1302  Gross per 24 hour  Intake    390 ml  Output   1403 ml  Net  -1013 ml   Filed Weights   06/23/15 1216 06/26/15 0739 06/26/15 1142  Weight: 60.8 kg (134 lb 0.6 oz) 66.2 kg (145 lb 15.1 oz) 64.9 kg (143 lb 1.3 oz)    Examination:  General exam:  Cachectic, pale, ill-appearing adult male.  Hiding under blanket at HD HEENT:  NCAT, MMM Respiratory system:  CTAB Cardiovascular system: tachycardic, regular rhythm.  2/6 systolic murmurs.  Warm extremities Gastrointestinal system: Normal active bowel sounds, soft, nondistended, nontender MSK:  Decreased tone and bulk, no lower extremity edema Neuro:  Grossly moves all extremities.     Data Reviewed: I have personally reviewed following labs and imaging studies  CBC:  Recent Labs Lab 06/23/15 0545 06/24/15 0643 06/24/15 1030 06/25/15 0548 06/26/15 0525  WBC 2.8* 2.1* 2.3* 2.7* 2.7*  HGB 7.4* 5.1* 5.0* 7.4* 7.6*  HCT 23.1* 17.0* 15.8* 22.3* 23.6*  MCV 78.3 82.1 80.2 78.8 79.7  PLT 61* 42* 47* 48* 63*   Basic Metabolic Panel:  Recent Labs Lab 06/22/15 0520 06/23/15 0545 06/24/15 0643 06/25/15 0548 06/26/15 0525  NA 140 141 141 136 136  K 3.9 3.8 4.0 3.9 4.3  CL 100* 103 103 101  102  CO2 27 25 27 26 23   GLUCOSE 122* 154* 146* 87 124*  BUN 23* 44* 22* 28* 34*  CREATININE 4.36* 6.68* 4.18* 6.13* 7.69*  CALCIUM 8.2* 8.6* 8.0* 8.0* 8.0*   GFR: Estimated Creatinine Clearance: 8.3 mL/min (by C-G formula based on Cr of 7.69). Liver Function Tests: No results for input(s): AST, ALT, ALKPHOS, BILITOT, PROT, ALBUMIN in the last 168 hours. No results for input(s): LIPASE, AMYLASE in the last 168 hours. No results for input(s): AMMONIA in the last 168 hours. Coagulation  Profile:  Recent Labs Lab 06/21/15 1150 06/23/15 0545  INR 1.36 1.15   Cardiac Enzymes: No results for input(s): CKTOTAL, CKMB, CKMBINDEX, TROPONINI in the last 168 hours. BNP (last 3 results) No results for input(s): PROBNP in the last 8760 hours. HbA1C: No results for input(s): HGBA1C in the last 72 hours. CBG:  Recent Labs Lab 06/25/15 1138 06/25/15 1707 06/25/15 2111 06/26/15 1255 06/26/15 1650  GLUCAP 134* 101* 115* 84 145*   Lipid Profile: No results for input(s): CHOL, HDL, LDLCALC, TRIG, CHOLHDL, LDLDIRECT in the last 72 hours. Thyroid Function Tests: No results for input(s): TSH, T4TOTAL, FREET4, T3FREE, THYROIDAB in the last 72 hours. Anemia Panel: No results for input(s): VITAMINB12, FOLATE, FERRITIN, TIBC, IRON, RETICCTPCT in the last 72 hours. Urine analysis:    Component Value Date/Time   COLORURINE YELLOW 06/01/2015 [place] 06/01/2015 1540   LABSPEC 1.008 06/01/2015 1540   PHURINE 6.0 06/01/2015 1540   GLUCOSEU NEGATIVE 06/01/2015 1540   HGBUR TRACE* 06/01/2015 1540   BILIRUBINUR NEGATIVE 06/01/2015 1540   KETONESUR NEGATIVE 06/01/2015 1540   PROTEINUR 30* 06/01/2015 1540   UROBILINOGEN 1.0 01/16/2012 1902   NITRITE NEGATIVE 06/01/2015 1540   LEUKOCYTESUR NEGATIVE 06/01/2015 1540   Sepsis Labs: @LABRCNTIP (procalcitonin:4,lacticidven:4)  ) Recent Results (from the past 240 hour(s))  Blood culture (routine x 2)     Status: None   Collection Time: 06/16/15 10:11 PM  Result Value Ref Range Status   Specimen Description BLOOD RIGHT ANTECUBITAL  Final   Special Requests BOTTLES DRAWN AEROBIC AND ANAEROBIC 5CC   Final   Culture NO GROWTH 5 DAYS  Final   Report Status 06/21/2015 FINAL  Final  Blood culture (routine x 2)     Status: None   Collection Time: 06/16/15 10:30 PM  Result Value Ref Range Status   Specimen Description BLOOD RIGHT HAND  Final   Special Requests IN PEDIATRIC BOTTLE [place]  Final   Culture NO GROWTH 5 DAYS   Final   Report Status 06/21/2015 FINAL  Final      Radiology Studies: No results found.   Scheduled Meds: . sodium chloride   Intravenous Once  . sodium chloride   Intravenous Once  . sodium chloride   Intravenous Once  . calcitRIOL  0.25 mcg Oral Q T,Th,Sa-HD  . darbepoetin (ARANESP) injection - DIALYSIS  100 mcg Intravenous Q Tue-HD  . diphenoxylate-atropine  5 mL Oral QID  . feeding supplement (ENSURE ENLIVE)  237 mL Oral TID BM  . LORazepam  1 mg Intravenous QHS,MR X 1  . ondansetron (ZOFRAN) IV  8 mg Intravenous Q6H  . pantoprazole  40 mg Oral BID  . sodium chloride flush  3 mL Intravenous Q12H   Continuous Infusions:     LOS: 10 days    Time spent: 30 min    Janece Canterbury, MD [facility] Pager [phone]  If 7PM-7AM, please contact night-coverage [email or web address] Password [facility] 06/26/2015, 6:39 PM

## 2015-06-27 DIAGNOSIS — R066 Hiccough: Secondary | ICD-10-CM | POA: Insufficient documentation

## 2015-06-27 LAB — CBC
HCT: 28.3 % — ABNORMAL LOW (ref 39.0–52.0)
Hemoglobin: 8.9 g/dL — ABNORMAL LOW (ref 13.0–17.0)
MCH: 25.1 pg — AB (ref 26.0–34.0)
MCHC: 31.4 g/dL (ref 30.0–36.0)
MCV: 79.7 fL (ref 78.0–100.0)
PLATELETS: 34 10*3/uL — AB (ref 150–400)
RBC: 3.55 MIL/uL — ABNORMAL LOW (ref 4.22–5.81)
RDW: 18.4 % — AB (ref 11.5–15.5)
WBC: 2.8 10*3/uL — AB (ref 4.0–10.5)

## 2015-06-27 LAB — BASIC METABOLIC PANEL
ANION GAP: 9 (ref 5–15)
BUN: 14 mg/dL (ref 6–20)
CALCIUM: 7.2 mg/dL — AB (ref 8.9–10.3)
CO2: 26 mmol/L (ref 22–32)
CREATININE: 5.05 mg/dL — AB (ref 0.61–1.24)
Chloride: 101 mmol/L (ref 101–111)
GFR calc Af Amer: 12 mL/min — ABNORMAL LOW (ref >60)
GFR, EST NON AFRICAN AMERICAN: 11 mL/min — AB (ref >60)
GLUCOSE: 97 mg/dL (ref 65–99)
Potassium: 4.2 mmol/L (ref 3.5–5.1)
Sodium: 136 mmol/L (ref 135–145)

## 2015-06-27 LAB — GLUCOSE, CAPILLARY
Glucose-Capillary: 104 mg/dL — ABNORMAL HIGH (ref 65–99)
Glucose-Capillary: 160 mg/dL — ABNORMAL HIGH (ref 65–99)

## 2015-06-27 MED ORDER — SODIUM CHLORIDE 0.9 % IV SOLN
Freq: Once | INTRAVENOUS | Status: AC
  Administered 2015-06-28: 03:00:00 via INTRAVENOUS

## 2015-06-27 MED ORDER — SODIUM CHLORIDE 0.9 % IV SOLN: 100.0000 mL | INTRAVENOUS | Status: DC | PRN

## 2015-06-27 MED ORDER — ALTEPLASE 2 MG IJ SOLR: 2.0000 mg | Freq: Once | INTRAMUSCULAR | Status: DC | PRN

## 2015-06-27 MED ORDER — LORAZEPAM 1 MG PO TABS
1.0000 mg | ORAL_TABLET | Freq: Every day | ORAL | Status: DC
Start: 2015-06-27 — End: 2015-06-28
  Administered 2015-06-27: 1 mg via ORAL
  Filled 2015-06-27: qty 1

## 2015-06-27 MED ORDER — HEPARIN SODIUM (PORCINE) 1000 UNIT/ML DIALYSIS: 1000.0000 [IU] | INTRAMUSCULAR | Status: DC | PRN

## 2015-06-27 MED ORDER — LIDOCAINE HCL (PF) 1 % IJ SOLN
5.0000 mL | INTRAMUSCULAR | Status: DC | PRN
Start: 2015-06-27 — End: 2015-06-28

## 2015-06-27 MED ORDER — MORPHINE SULFATE 10 MG/5ML PO SOLN
2.5000 mg | ORAL | Status: DC | PRN
  Filled 2015-06-27: qty 2

## 2015-06-27 MED ORDER — LORAZEPAM 1 MG PO TABS
1.0000 mg | ORAL_TABLET | Freq: Four times a day (QID) | ORAL | Status: DC | PRN
  Administered 2015-06-28: 1 mg via ORAL
  Filled 2015-06-27: qty 1

## 2015-06-27 MED ORDER — PENTAFLUOROPROP-TETRAFLUOROETH EX AERO: 1.0000 "application " | INHALATION_SPRAY | CUTANEOUS | Status: DC | PRN

## 2015-06-27 MED ORDER — LIDOCAINE-PRILOCAINE 2.5-2.5 % EX CREA: 1.0000 "application " | TOPICAL_CREAM | CUTANEOUS | Status: DC | PRN

## 2015-06-27 NOTE — Progress Notes (Addendum)
Daily Progress Note   Patient Name: Eddie Thomas       Date: 06/27/2015 DOB: September 13, 1945  Age: 70 y.o. MRN#: [id number] Attending Physician: Janece Canterbury, MD Primary Care Physician: [name] Admit Date: 06/16/2015  Reason for Consultation/Follow-up: Establishing goals of care and Terminal Care  Subjective: Talked with Eddie Thomas this morning.  He seemed confused and seemed not to understand he was dying.  I did not push the issue but rather talked with Eddie Thomas (Dtr) who explained to me he is confused in the morning.  However, She says he clearly knew he was dying last night after our meeting - as he was giving instructions to his children about how to handle things after his death.  Hospice has been requested.  They will hopefully engage with the family today.  My best recommendation is for [facility], but the family wants to take him home.  This concerns me as he has the potential to have a significant GI bleed which could be traumatizing to both the patient and the family.  After talking with Dr. Sheran Fava and his daughter Eddie Thomas, the plan at this point is for him to discharge with Hospice on 6/8 after hemodialysis. Once he is on Hospice there are no further transfusions or HD sessions.   Length of Stay: 11  Current Medications: Scheduled Meds:  . sodium chloride   Intravenous Once  . sodium chloride   Intravenous Once  . sodium chloride   Intravenous Once  . calcitRIOL  0.25 mcg Oral Q T,Th,Sa-HD  . darbepoetin (ARANESP) injection - DIALYSIS  100 mcg Intravenous Q Tue-HD  . diphenoxylate-atropine  5 mL Oral QID  . feeding supplement (ENSURE ENLIVE)  237 mL Oral TID BM  . LORazepam  1 mg Intravenous QHS,MR X 1  . ondansetron (ZOFRAN) IV  8 mg Intravenous Q6H  .  pantoprazole  40 mg Oral BID  . sodium chloride flush  3 mL Intravenous Q12H        PRN Meds: acetaminophen **OR** acetaminophen, chlorproMAZINE (THORAZINE) IV, HYDROmorphone (DILAUDID) injection, LORazepam, ondansetron **OR** ondansetron (ZOFRAN) IV, sodium chloride flush  Physical Exam    General: Cachexic, black male, appears younger than stated age.  Pleasantly confused this morning. Respiratory: CTA, no distress. Musc: Moves all 4 extremities abdomen non-distended Psych: quiet, tearful  Vital Signs: BP 114/56 mmHg  Pulse 77  Temp(Src) 98.7 F (37.1 C) (Oral)  Resp 18  Ht _0  (1.803 m)  Wt 71.2 kg (156 lb 15.5 oz)  BMI 21.90 kg/m2  SpO2 100% SpO2: SpO2: 100 % O2 Device: O2 Device: Not Delivered O2 Flow Rate: O2 Flow Rate (L/min): 2 L/min  Intake/output summary:   Intake/Output Summary (Last 24 hours) at 06/27/15 0017 Last data filed at 06/27/15 0800  Gross per 24 hour  Intake   1240 ml  Output   1403 ml  Net   -163 ml   LBM: Last BM Date: 06/25/15 Baseline Weight: Weight: 65 kg (143 lb 4.8 oz) Most recent weight: Weight: 71.2 kg (156 lb 15.5 oz)       Palliative Assessment/Data:    Flowsheet Rows        Most Recent Value   Intake Tab    Referral Department  Hospitalist   Unit at Time of Referral  Med/Surg Unit   Palliative Care Primary Diagnosis  Cancer   Date Notified  06/22/15   Palliative Care Type  New Palliative care   Reason for referral  Non-pain Symptom, Clarify Goals of Care, Counsel Regarding Hospice   Date of Admission  06/16/15   Date first seen by Palliative Care  06/23/15   # of days Palliative referral response time  1 Day(s)   # of days IP prior to Palliative referral  6   Clinical Assessment    Palliative Performance Scale Score  40%   Pain Max last 24 hours  Not able to report   Pain Min Last 24 hours  Not able to report   Dyspnea Max Last 24 Hours  Not able to report   Dyspnea Min Last 24 hours  Not able to report   Nausea  Max Last 24 Hours  Not able to report   Nausea Min Last 24 Hours  Not able to report   Anxiety Max Last 24 Hours  Not able to report   Anxiety Min Last 24 Hours  Not able to report   Other Max Last 24 Hours  Not able to report   Psychosocial & Spiritual Assessment    Palliative Care Outcomes    Patient/Family meeting held?  Yes   Who was at the meeting?  wife and dtr   Palliative Care Outcomes  Improved non-pain symptom therapy   Patient/Family wishes: Interventions discontinued/not started   Mechanical Ventilation   Palliative Care follow-up planned  Yes, Facility      Patient Active Problem List   Diagnosis Date Noted  . Encounter for hospice care discussion   . Goals of care, counseling/discussion   . Insomnia   . Palliative care encounter   . Hematemesis with nausea   . Dysphagia, pharyngoesophageal phase   . FUO (fever of unknown origin)   . Protein-calorie malnutrition, severe 06/18/2015  . Aspiration pneumonitis ([place]) 06/17/2015  . Pyrexia   . Generalized weakness 06/16/2015  . SIRS (systemic inflammatory response syndrome) ([place]) 06/16/2015  . ESRD on hemodialysis ([place]) 06/16/2015  . Nausea and vomiting 06/16/2015  . Chest pain   . Altered mental state   . ESRD (end stage renal disease) ([place])   . Fever   . Dehydration 06/01/2015  . Acute on chronic renal failure ([place]) 06/01/2015  . Hypoalbuminemia due to protein-calorie malnutrition ([place]) 06/01/2015  . Antineoplastic chemotherapy induced pancytopenia ([place]) 06/01/2015  . Long term current use of anticoagulant therapy 06/01/2015  .  Acute renal failure (ARF) ([place]) 06/01/2015  . Encounter for antineoplastic chemotherapy 03/05/2015  . Gastroesophageal reflux disease with esophagitis 02/02/2015  . DVT (deep venous thrombosis) ([place]) 07/17/2014  . Antineoplastic chemotherapy induced anemia 06/21/2014  . Hyperbilirubinemia 05/15/2014  . Back pain 12/29/2013  . Bone marrow transplant complication ([place]) 72/62/0355  .  Dysphagia, pharyngoesophageal 01/19/2012  . Diabetes mellitus ([place]) 07/03/2011  . Hypertension 07/03/2011  . Hyperlipidemia 07/03/2011  . Multiple myeloma ([place]) 12/02/2010    Palliative Care Assessment & Plan   Patient Profile: 70 yo male with end stage multiple myeloma with no further oncology treatments available; end stage kidney failure requiring dialysis.  He has had 17 units of blood in the last 2 months (7 units in the month of June).  Further transfusions are limited due to the build up of antibodies.    He has erosive esophagitis, and on-going GI bleeding. DNR, DNI in place. Daughter and wife are primary decision makers.  Recommendations/Plan:  Engage Hospice.  Family wants strongly to take him home with Hospice.   My best advice that they utilize [facility] - particularly as Mr. Tonne is at high risk for GI bleeding.      I changed IV dilaudid and IV ativan to PO liquid morphine and PO ativan in anticipation of d/c to home.  Potential discharge planned for 6/8 with hospice.  Goals of Care and Additional Recommendations:  Limitations on Scope of Treatment: No Artificial Feeding  Code Status:DNR  Prognosis:   Hours - Days due to end stage MM, now with kidney failure and active bleeding.  No further HD or transfusions once on Hospice service.  Discharge Planning:  [place] vs Home with Hospice.  Care plan was discussed with daughter and patient. Will be further discussed with spouse, daughter, and son to clarify [place] and make determinations regarding future interventions and possibly initiation of comfort care.  Thank you for allowing the Palliative Medicine Team to assist in the care of this patient.   Time In: 9:00 Time Out: 9:35 Total Time 35 Prolonged Time Billed  No      Greater than 50%  of this time was spent counseling and coordinating care related to the above assessment and plan.  Imogene Burn, PA-C Palliative Medicine Pager:  [phone]   Please contact Palliative Medicine Team phone at [phone] for questions and concerns.

## 2015-06-27 NOTE — Progress Notes (Signed)
[facility] Progress Note  Subjective: "I'm doing OK..I'm not sure if I'll get to go home though...." Patient in low fowler's position in NAD. No C/Os.   Objective Filed Vitals:   06/27/15 0445 06/27/15 0510 06/27/15 0750 06/27/15 0931  BP: 119/57 106/56 114/56 125/56  Pulse: 79 77  77  Temp: 98.8 F (37.1 C) 98.5 F (36.9 C) 98.7 F (37.1 C) 98.4 F (36.9 C)  TempSrc: Oral Oral Oral Oral  Resp: 18 18  17   Height:      Weight:      SpO2: 100% 100% 100% 100%   Physical Exam General: Thin, frail appearing male, pleasant NAD Heart: S1,S2, RRR Lungs: bilateral breath sounds CTA A/P Abdomen: soft, non-distended, non-tender.  Extremities: No LE edema.   Dialysis Access: right IJ, drsg CDI. Maturing LUA AVF + bruit  DIalysis: TTS [place] 4h 62.5kg 3/2.25 bath Heparin none R IJ cath/ L BC AVG (placed 06/08/15, Dr Kellie Simmering) Hect 1 ug  Mirc 200 q 2   Additional Objective Labs: Basic Metabolic Panel:  Recent Labs Lab 06/24/15 0643 06/25/15 0548 06/26/15 0525  NA 141 136 136  K 4.0 3.9 4.3  CL 103 101 102  CO2 27 26 23   GLUCOSE 146* 87 124*  BUN 22* 28* 34*  CREATININE 4.18* 6.13* 7.69*  CALCIUM 8.0* 8.0* 8.0*   CBC:  Recent Labs Lab 06/23/15 0545 06/24/15 0643 06/24/15 1030 06/25/15 0548 06/26/15 0525  WBC 2.8* 2.1* 2.3* 2.7* 2.7*  HGB 7.4* 5.1* 5.0* 7.4* 7.6*  HCT 23.1* 17.0* 15.8* 22.3* 23.6*  MCV 78.3 82.1 80.2 78.8 79.7  PLT 61* 42* 47* 48* 63*   Blood Culture    Component Value Date/Time   SDES BLOOD RIGHT HAND 06/16/2015 2230   SPECREQUEST IN PEDIATRIC BOTTLE 1CC 06/16/2015 2230   CULT NO GROWTH 5 DAYS 06/16/2015 2230   REPTSTATUS 06/21/2015 FINAL 06/16/2015 2230    CBG:  Recent Labs Lab 06/25/15 1707 06/25/15 2111 06/26/15 1255 06/26/15 1650 06/27/15 0756  GLUCAP 101* 115* 84 145* 104*   IStudies/Results: No results found. Medications:   . sodium chloride   Intravenous Once  . sodium chloride    Intravenous Once  . sodium chloride   Intravenous Once  . calcitRIOL  0.25 mcg Oral Q T,Th,Sa-HD  . darbepoetin (ARANESP) injection - DIALYSIS  100 mcg Intravenous Q Tue-HD  . diphenoxylate-atropine  5 mL Oral QID  . feeding supplement (ENSURE ENLIVE)  237 mL Oral TID BM  . LORazepam  1 mg Oral QHS  . ondansetron (ZOFRAN) IV  8 mg Intravenous Q6H  . pantoprazole  40 mg Oral BID  . sodium chloride flush  3 mL Intravenous Q12H    Assessment/Plan: 1 Melena/ acute on chron anemia -Last Hgb 7.6. Rec'd 2 units of PRBCs this AM. Has not been rechecked since transfusion.  Attempting to optimize HGB so that patient may possibly go home. Per primary.  2 Nausea/ vomiting/ hiccups - severe esophagitis by EGD, on PPI rx, esophageal biopsy showed noninfectious ulceration, no malignancy detected 3 ESRD - TTS HD no hep. HD yesterday-tolerated well Will continue HD until we are advised that patient/family wishes HD therapy to be terminated. Orders written for HD tomorrow. K+ 4.3 yesterday. 2.0 K bath recheck K+ prior to HD/  4 Secondary hyperparathyroidism - calcitriol 0.25 q HD; no binders Ca 8.0 C Ca 9.44 5 HTN - SBP stable in 100-120s. Last HD 06/26/15 Pre wt 66.2 kg Net UF 1403 Post wt  64.9 kg. Wt today 71.2! Recheck wt Attempt UFG 0.5-1 liter tomorrow.  56liters UFG tomorrow.  6 Nutrition - alb 2.5 added Resource - ok for CL per GI 7 MM - refractory to multiple regimens, sp SCT in 2012, last regimen Velcade/daratumumab with last dose early-mid April 8 DM - per primary 9 EOL - discussions w pall care and family ongoing , appreciate pall care/ oncology/ prim team/ GI efforts. Palliative care note this AM suggests that patient may be going home per family wishes with hospice. Will Follow.   Selby Slovacek H. Larnie Heart NP-C 06/25/2015, 10:00 AM  [employer] [phone]

## 2015-06-27 NOTE — Care Management Note (Signed)
Case Management Note  Patient Details  Name: Eddie Thomas MRN: [id number] Date of Birth: Jan 16, 1946  Subjective/Objective:       CM following for progression and d/c planning.              Action/Plan: 06/27/2015  Spoke with pt daughter re d/c plan. [facility] selected for home services.  Hospice will contact family and arrange DME delivery. CM will continue to follow and assist as needed.   Expected Discharge Date:    06/28/2015              Expected Discharge Plan:  Home w Hospice Care  In-House Referral:  NA  Discharge planning Services  CM Consult  Post Acute Care Choice:  Durable Medical Equipment, Home Health Choice offered to:     DME Arranged:    DME Agency:     HH Arranged:    HH Agency:  [facility]  Status of Service:  In process, will continue to follow  Medicare Important Message Given:  Yes Date Medicare IM Given:    Medicare IM give by:    Date Additional Medicare IM Given:    Additional Medicare Important Message give by:     If discussed at [place] of Stay Meetings, dates discussed:    Additional Comments:  Adron Bene, RN 06/27/2015, 10:17 AM

## 2015-06-27 NOTE — Progress Notes (Addendum)
PROGRESS NOTE  Eddie Thomas  LAG:[id number] DOB: 1945/11/20 DOA: 06/16/2015 PCP: Eddie Thomas  Brief Narrative:  70 y.o. male with medical history significant of multiple myeloma, IgA subtype, candidal esophagitis with stricture dilated in late April by Eddie Thomas, ESRD, hypertension, diabetes mellitus, hx of DVT no longer on anticoagulation due to bleeding complications.  He was recently hospitalized in Mid-May for acute renal failure likely secondary to progressive multiple myeloma.  He was started on dialysis and deemed ESRD.  He was discharged, but presented a few days later with fever, nausea and vomiting.  He likely had aspiration pneumonia and may have some malignancy related fevers superimposed.  He has had rapidly worsening anemia requiring more frequent blood transfusions.  Blood transfusions are complicated by his multiple myeloma and by the medications he has received to treat his myeloma which may finding matching blood difficult (takes several days to acquire blood).  Although his myeloma is contributing to his pancytopenia, his anemia is further worsened by his hematemesis.  He underwent EGD on 6/1 which demonstrated hemorrhagic esophagitis.  Despite sitting upright and using PPI he continues to have ongoing melena and rapid anemia.  Due to his progressive cancer, prolonged hospitalizations, and refractory nausea/vomiting, he has become cachectic and frail with severe protein calorie malnutrition.   Patient and family are amenable to hospice care.  Planning for HD on Thursday and transfusing as necessary until he can go home with hospice, probably Friday.  Assessment & Thomas:   Principal Problem:   SIRS (systemic inflammatory response syndrome) (HCC) Active Problems:   Multiple myeloma (HCC)   Diabetes mellitus (HCC)   Antineoplastic chemotherapy induced anemia   Hypoalbuminemia due to protein-calorie malnutrition (HCC)   Generalized weakness   ESRD on hemodialysis  (HCC)   Nausea and vomiting   Aspiration pneumonitis (HCC)   Pyrexia   Protein-calorie malnutrition, severe   FUO (fever of unknown origin)   Hematemesis with nausea   Dysphagia, pharyngoesophageal phase   Palliative care encounter   Goals of care, counseling/discussion   Insomnia   Encounter for hospice care discussion   Hiccups  SIRS/Fever, suspect aspiration pneumonia given his frequent hematemesis vs neoplastic fever from widespread myeloma.  Completed 8-days of treatment for aspiration pneumonia -  Fevers improved.  Anemia of neoplastic disease and acute blood loss.  Having melena now and hemoglobin dropping more rapidly.  Received two units Multiple antibodies related to his myeloma medications make finding compatible blood difficult.  Overall Hemoglobin dropping more rapidly now and having ongoing melena.  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  Hematemesis improving, melena ongoing - EGD on 6/1 found hemorrhagic esophagitis - pathology from 6/1 esophageal biopsies with noninfectious ulceration, no malignancy detected - continue full liquid diet and do not advance - continue IV PPI  - Appreciate GI assistance   Multiple Myeloma, progressing with worsening anemia, new ESRD, rising B2 microglobulin, multiple lytic lesions - refractory to multiple chemo regimens and SCT in 2012 - Eddie Thomas had family meeting on 6/2 with patient, wife, and daughter Eddie Thomas and recommended hospice care - Palliative care assistance appreciated - transition to home hospice after equipment delivered  Acute toxic and metabolic encephalopathy, resolved, likely multifactorial including infection, medications, worsening anemia -06/12/15 CT brain negative -ammonia--24,  -vitamin B12--266 >  started  supplementation -TSH--0.493, Free T4--1.04 -Discontinued Thorazine, baclofen, opioids, gabapentin, other hypnotics  Atypical Chest Pain, likely due to esophageal stricture and dilation -VQ scan neg -EKG-no concerning ischemic changes -troponins minimally elevated due to ESRD  Pancytopenia, platelets dropping with ongoing bleeding - Secondary to multiple myeloma and related cancer chemotherapy. - transfuse 1 unit of platelets  NSVT: - Patient had 15 beats of nonsustained V. tach, hypomagnesemia corrected,  - 2-D echo: Normal EF - d/c'd metoprolol due to hypotension  History of DVT - Xarelto DC'ed given renal function (took Xarelto approximately one year) - Not good anticoagulation candidate due to bleeding complications.   Essential hypertension, blood pressures decreasing and intermittently hypotensive - ACEI stopped due to acute renal failure.   Diabetes mellitus type 2, CBG well-controlled, requiring minimal insulin and at risk for hypoglycemia - continue BID CBG -  D/c'd SSI  Severe protein calorie malnutrition - continue full liquid diet - continue supplements  DVT prophylaxis:  heparin Code Status:  DNR Family Communication:  Patient during dialysis and privately with daughter Eddie Thomas Disposition Thomas:   Thomas for transfusion of platelets tonight, starting enrollment in hospice with delivery of equipment Thursday or early Friday.  Would likely be ready for discharge to home Friday morning.  Consultants:   GI  Nephrology  Vascular surgery  Oncology  Palliative care  Procedures:  none  Antimicrobials:   Zosyn   Augmentin  Unasyn completed an 8-day course for aspiration pneumonia   Subjective: Nausea and hiccoughs are better, two melanotic stools.  Confused this morning  Objective: Filed Vitals:   06/27/15 0510 06/27/15 0750 06/27/15 0931 06/27/15 1815  BP: 106/56 114/56 125/56 121/60  Pulse: 77  77 75  Temp: 98.5 F (36.9 C) 98.7 F (37.1 C)  98.4 F (36.9 C) 98.7 F (37.1 C)  TempSrc: Oral Oral Oral Oral  Resp: 18  17 18   Height:      Weight:      SpO2: 100% 100% 100% 98%    Intake/Output Summary (Last 24 hours) at 06/27/15 2009 Last data filed at 06/27/15 1700  Gross per 24 hour  Intake   1480 ml  Output      0 ml  Net   1480 ml   Filed Weights   06/26/15 0739 06/26/15 1142 06/26/15 1957  Weight: 66.2 kg (145 lb 15.1 oz) 64.9 kg (143 lb 1.3 oz) 71.2 kg (156 lb 15.5 oz)    Examination:  General exam:  Cachectic, pale, ill-appearing adult male.  HEENT:  NCAT, MMM Respiratory system:  CTAB Cardiovascular system: tachycardic, regular rhythm.  2/6 systolic murmurs.  Warm extremities Gastrointestinal system: Normal active bowel sounds, soft, nondistended, nontender MSK:  Decreased tone and bulk, 1+ pitting bilateral lower extremity edema Neuro:  Grossly moves all extremities.     Data Reviewed: I have personally reviewed following labs and imaging studies  CBC:  Recent Labs Lab 06/24/15 0643 06/24/15 1030 06/25/15 0548 06/26/15 0525 06/27/15 0950  WBC 2.1* 2.3* 2.7* 2.7* 2.8*  HGB 5.1* 5.0* 7.4* 7.6* 8.9*  HCT 17.0* 15.8* 22.3* 23.6* 28.3*  MCV 82.1 80.2 78.8 79.7 79.7  PLT 42* 47* 48* 63* 34*   Basic Metabolic Panel:  Recent Labs Lab 06/23/15 0545 06/24/15 0643 06/25/15 0548 06/26/15 0525 06/27/15 0950  NA 141 141 136 136 136  K 3.8 4.0 3.9 4.3 4.2  CL 103 103 101 102 101  CO2 25 27 26 23 26   GLUCOSE 154* 146* 87 124* 97  BUN  44* 22* 28* 34* 14  CREATININE 6.68* 4.18* 6.13* 7.69* 5.05*  CALCIUM 8.6* 8.0* 8.0* 8.0* 7.2*   GFR: Estimated Creatinine Clearance: 13.9 mL/min (by C-G formula based on Cr of 5.05). Liver Function Tests: No results for input(s): AST, ALT, ALKPHOS, BILITOT, PROT, ALBUMIN in the last 168 hours. No results for input(s): LIPASE, AMYLASE in the last 168 hours. No results for input(s): AMMONIA in the last 168 hours. Coagulation Profile:  Recent Labs Lab  06/21/15 1150 06/23/15 0545  INR 1.36 1.15   Cardiac Enzymes: No results for input(s): CKTOTAL, CKMB, CKMBINDEX, TROPONINI in the last 168 hours. BNP (last 3 results) No results for input(s): PROBNP in the last 8760 hours. HbA1C: No results for input(s): HGBA1C in the last 72 hours. CBG:  Recent Labs Lab 06/25/15 2111 06/26/15 1255 06/26/15 1650 06/27/15 0756 06/27/15 1800  GLUCAP 115* 84 145* 104* 160*   Lipid Profile: No results for input(s): CHOL, HDL, LDLCALC, TRIG, CHOLHDL, LDLDIRECT in the last 72 hours. Thyroid Function Tests: No results for input(s): TSH, T4TOTAL, FREET4, T3FREE, THYROIDAB in the last 72 hours. Anemia Panel: No results for input(s): VITAMINB12, FOLATE, FERRITIN, TIBC, IRON, RETICCTPCT in the last 72 hours. Urine analysis:    Component Value Date/Time   COLORURINE YELLOW 06/01/2015 [place] 06/01/2015 1540   LABSPEC 1.008 06/01/2015 1540   PHURINE 6.0 06/01/2015 1540   GLUCOSEU NEGATIVE 06/01/2015 1540   HGBUR TRACE* 06/01/2015 1540   BILIRUBINUR NEGATIVE 06/01/2015 1540   KETONESUR NEGATIVE 06/01/2015 1540   PROTEINUR 30* 06/01/2015 1540   UROBILINOGEN 1.0 01/16/2012 1902   NITRITE NEGATIVE 06/01/2015 1540   LEUKOCYTESUR NEGATIVE 06/01/2015 1540   Sepsis Labs: @LABRCNTIP (procalcitonin:4,lacticidven:4)  ) No results found for this or any previous visit (from the past 240 hour(s)).    Radiology Studies: No results found.   Scheduled Meds: . sodium chloride   Intravenous Once  . sodium chloride   Intravenous Once  . sodium chloride   Intravenous Once  . calcitRIOL  0.25 mcg Oral Q T,Th,Sa-HD  . darbepoetin (ARANESP) injection - DIALYSIS  100 mcg Intravenous Q Tue-HD  . diphenoxylate-atropine  5 mL Oral QID  . feeding supplement (ENSURE ENLIVE)  237 mL Oral TID BM  . LORazepam  1 mg Oral QHS  . ondansetron (ZOFRAN) IV  8 mg Intravenous Q6H  . pantoprazole  40 mg Oral BID  . sodium chloride flush  3 mL Intravenous  Q12H   Continuous Infusions:     LOS: 11 days    Time spent: 30 min    Janece Canterbury, MD [facility] Pager [phone]  If 7PM-7AM, please contact night-coverage [email or web address] Password [email or web address] 06/27/2015, 8:09 PM

## 2015-06-27 NOTE — Progress Notes (Signed)
Notified by [facility] of family request for Hospice and [place] services at home after discharge. Chart and patient information currently under review to confirm hospice eligibility.   Spoke with patient at bedside and daughter to initiate education related to hospice philosophy, services and team approach to care. Family verbalized understanding of the information provided. Per discussion, plan is for discharge to home by personal vehicle with son and daughter tomorrrow after HD.   Please send signed completed DNR form home with patient.  Patient will need prescriptions for discharge comfort medications.   DME needs discussed and patient still deciding if he would like a hospital bed.  Will follow-up in AM on any equipment needs. [facility] aware of the above.   Completed discharge summary will need to be faxed to [facility] at [phone] when final.  Please notify [facility] when patient is ready to leave unit at discharge-call [phone].  [facility] information and contact numbers have been given to patient and daughter during visit.    Please call with any questions.   Thank You,  Freddi Starr RN, [facility] Liaison  [phone]

## 2015-06-28 ENCOUNTER — Telehealth: Payer: Self-pay

## 2015-06-28 DIAGNOSIS — C9000 Multiple myeloma not having achieved remission: Secondary | ICD-10-CM

## 2015-06-28 DIAGNOSIS — D6481 Anemia due to antineoplastic chemotherapy: Secondary | ICD-10-CM

## 2015-06-28 DIAGNOSIS — R6510 Systemic inflammatory response syndrome (SIRS) of non-infectious origin without acute organ dysfunction: Secondary | ICD-10-CM

## 2015-06-28 DIAGNOSIS — N186 End stage renal disease: Secondary | ICD-10-CM

## 2015-06-28 DIAGNOSIS — J690 Pneumonitis due to inhalation of food and vomit: Principal | ICD-10-CM

## 2015-06-28 DIAGNOSIS — Z992 Dependence on renal dialysis: Secondary | ICD-10-CM

## 2015-06-28 DIAGNOSIS — T451X5A Adverse effect of antineoplastic and immunosuppressive drugs, initial encounter: Secondary | ICD-10-CM

## 2015-06-28 DIAGNOSIS — R509 Fever, unspecified: Secondary | ICD-10-CM

## 2015-06-28 LAB — TYPE AND SCREEN
ABO/RH(D): AB POS
ANTIBODY SCREEN: POSITIVE
DAT, IGG: NEGATIVE
UNIT DIVISION: 0
UNIT DIVISION: 0
Unit division: 0
Unit division: 0
Unit division: 0
Unit division: 0
Unit division: 0
Unit division: 0

## 2015-06-28 LAB — RENAL FUNCTION PANEL
ALBUMIN: 1.7 g/dL — AB (ref 3.5–5.0)
Anion gap: 10 (ref 5–15)
BUN: 18 mg/dL (ref 6–20)
CHLORIDE: 101 mmol/L (ref 101–111)
CO2: 24 mmol/L (ref 22–32)
CREATININE: 6.93 mg/dL — AB (ref 0.61–1.24)
Calcium: 7.3 mg/dL — ABNORMAL LOW (ref 8.9–10.3)
GFR, EST AFRICAN AMERICAN: 8 mL/min — AB (ref >60)
GFR, EST NON AFRICAN AMERICAN: 7 mL/min — AB (ref >60)
Glucose, Bld: 80 mg/dL (ref 65–99)
PHOSPHORUS: 4.3 mg/dL (ref 2.5–4.6)
POTASSIUM: 4.1 mmol/L (ref 3.5–5.1)
Sodium: 135 mmol/L (ref 135–145)

## 2015-06-28 LAB — CBC WITH DIFFERENTIAL/PLATELET
BASOS ABS: 0 10*3/uL (ref 0.0–0.1)
Basophils Relative: 0 %
EOS PCT: 0 %
Eosinophils Absolute: 0 10*3/uL (ref 0.0–0.7)
HEMATOCRIT: 28.4 % — AB (ref 39.0–52.0)
Hemoglobin: 8.8 g/dL — ABNORMAL LOW (ref 13.0–17.0)
LYMPHS ABS: 0.3 10*3/uL — AB (ref 0.7–4.0)
LYMPHS PCT: 15 %
MCH: 24.3 pg — ABNORMAL LOW (ref 26.0–34.0)
MCHC: 31 g/dL (ref 30.0–36.0)
MCV: 78.5 fL (ref 78.0–100.0)
MONO ABS: 0.1 10*3/uL (ref 0.1–1.0)
Monocytes Relative: 3 %
NEUTROS ABS: 1.9 10*3/uL (ref 1.7–7.7)
Neutrophils Relative %: 82 %
Platelets: 56 10*3/uL — ABNORMAL LOW (ref 150–400)
RBC: 3.62 MIL/uL — AB (ref 4.22–5.81)
RDW: 18.5 % — ABNORMAL HIGH (ref 11.5–15.5)
WBC: 2.4 10*3/uL — ABNORMAL LOW (ref 4.0–10.5)

## 2015-06-28 LAB — CBC
HCT: 14.8 % — ABNORMAL LOW (ref 39.0–52.0)
HEMOGLOBIN: 4.6 g/dL — AB (ref 13.0–17.0)
MCH: 24.7 pg — AB (ref 26.0–34.0)
MCHC: 31.1 g/dL (ref 30.0–36.0)
MCV: 79.6 fL (ref 78.0–100.0)
Platelets: 27 10*3/uL — CL (ref 150–400)
RBC: 1.86 MIL/uL — AB (ref 4.22–5.81)
RDW: 18.8 % — ABNORMAL HIGH (ref 11.5–15.5)
WBC: 1.3 10*3/uL — CL (ref 4.0–10.5)

## 2015-06-28 LAB — GLUCOSE, CAPILLARY: GLUCOSE-CAPILLARY: 77 mg/dL (ref 65–99)

## 2015-06-28 LAB — PREPARE PLATELET PHERESIS: Unit division: 0

## 2015-06-28 MED ORDER — LORAZEPAM 1 MG PO TABS: 1.0000 mg | ORAL_TABLET | Freq: Three times a day (TID) | ORAL | Status: AC | PRN

## 2015-06-28 MED ORDER — DIPHENOXYLATE-ATROPINE 2.5-0.025 MG/5ML PO LIQD: 5.0000 mL | Freq: Four times a day (QID) | ORAL | Status: AC | PRN

## 2015-06-28 MED ORDER — CALCITRIOL 0.25 MCG PO CAPS
ORAL_CAPSULE | ORAL | Status: AC
  Administered 2015-06-28: 0.25 ug via ORAL
  Filled 2015-06-28: qty 1

## 2015-06-28 MED ORDER — DIPHENOXYLATE-ATROPINE 2.5-0.025 MG PO TABS: 1.0000 | ORAL_TABLET | Freq: Four times a day (QID) | ORAL | Status: AC | PRN

## 2015-06-28 MED ORDER — HEPARIN SOD (PORK) LOCK FLUSH 100 UNIT/ML IV SOLN
500.0000 [IU] | INTRAVENOUS | Status: AC | PRN
  Administered 2015-06-28: 500 [IU]

## 2015-06-28 MED ORDER — LORAZEPAM 1 MG PO TABS: 1.0000 mg | ORAL_TABLET | Freq: Every day | ORAL | Status: AC

## 2015-06-28 MED ORDER — MORPHINE SULFATE 10 MG/5ML PO SOLN: 2.5000 mg | Freq: Four times a day (QID) | ORAL | Status: AC | PRN

## 2015-06-28 NOTE — Care Management Important Message (Signed)
Important Message  Patient Details  Name: Eddie Thomas MRN: [id number] Date of Birth: 1945/11/03   Medicare Important Message Given:  Yes    Loann Quill 06/28/2015, 10:25 AM

## 2015-06-28 NOTE — Progress Notes (Signed)
No charge note.  Discussed case with [facility], Freddi Starr.  Family is prepared to take patient home today.  Hospice is engaged and will have an RN at the patient's house this afternoon.  There may be a very narrow window of stability and opportunity for this patient to spend time at home as he is losing blood so quickly.    Barring a brisk active bleed our best recommendation is that he be discharged today after hemodialysis.  We also strongly recommend that he be transitioned to hospice house quickly - as he is likely to have a traumatic bleed at home.  The family is insistent he go home for at least a day or two to be able to "take care of some things"  PMT is available to help in any way needed.     Imogene Burn, [facility] Pager: [phone]

## 2015-06-28 NOTE — Care Management Note (Signed)
Case Management Note  Patient Details  Name: Eddie Thomas MRN: [id number] Date of Birth: 1945/02/05  Subjective/Objective:            CM following for progression and d/c planning.         Action/Plan: 06/28/2015 Plan for pt to d/c to home today with family, HD prior to d/c. Plan is to stop HD , hospice services with [facility] to begin to pm once pt arrives at home. [facility] has arranged all DME and will provide [facility] services.   Expected Discharge Date:                  Expected Discharge Plan:  Home w Hospice Care  In-House Referral:  NA  Discharge planning Services  CM Consult  Post Acute Care Choice:  Durable Medical Equipment, Home Health Choice offered to:     DME Arranged:    DME Agency:     HH Arranged:  RN, Nurse's Aide, Social Work [employer] Agency:  Hospice and [place] of Service:  Completed, signed off  Medicare Important Message Given:  Yes Date Medicare IM Given:    Medicare IM give by:    Date Additional Medicare IM Given:    Additional Medicare Important Message give by:     If discussed at [place] of Stay Meetings, dates discussed:    Additional Comments:  Adron Bene, RN 06/28/2015, 2:52 PM

## 2015-06-28 NOTE — Telephone Encounter (Signed)
Eddie Thomas with hospice called. She is establishing pt at home with hospice. She is requesting lomotil in pill form. Pt does not like taste of the liquid. rx called [facility] after consult with Dr Julien Nordmann.

## 2015-06-28 NOTE — Discharge Instructions (Addendum)
Gastrointestinal Bleeding Gastrointestinal (GI) bleeding means there is bleeding somewhere along the digestive tract, between the mouth and anus. CAUSES  There are many different problems that can cause GI bleeding. Possible causes include:  Esophagitis. This is inflammation, irritation, or swelling of the esophagus.  Hemorrhoids.These are veins that are full of blood (engorged) in the rectum. They cause pain, inflammation, and may bleed.  Anal fissures.These are areas of painful tearing which may bleed. They are often caused by passing hard stool.  Diverticulosis.These are pouches that form on the colon over time, with age, and may bleed significantly.  Diverticulitis.This is inflammation in areas with diverticulosis. It can cause pain, fever, and bloody stools, although bleeding is rare.  Polyps and cancer. Colon cancer often starts out as precancerous polyps.  Gastritis and ulcers.Bleeding from the upper gastrointestinal tract (near the stomach) may travel through the intestines and produce black, sometimes tarry, often bad smelling stools. In certain cases, if the bleeding is fast enough, the stools may not be black, but red. This condition may be life-threatening. SYMPTOMS   Vomiting bright red blood or material that looks like coffee grounds.  Bloody, black, or tarry stools. DIAGNOSIS  Your caregiver may diagnose your condition by taking your history and performing a physical exam. More tests may be needed, including:  X-rays and other imaging tests.  Esophagogastroduodenoscopy (EGD). This test uses a flexible, lighted tube to look at your esophagus, stomach, and small intestine.  Colonoscopy. This test uses a flexible, lighted tube to look at your colon. TREATMENT  Treatment depends on the cause of your bleeding.   For bleeding from the esophagus, stomach, small intestine, or colon, the caregiver doing your EGD or colonoscopy may be able to stop the bleeding as part of  the procedure.  Inflammation or infection of the colon can be treated with medicines.  Many rectal problems can be treated with creams, suppositories, or warm baths.  Surgery is sometimes needed.  Blood transfusions are sometimes needed if you have lost a lot of blood. If bleeding is slow, you may be allowed to go home. If there is a lot of bleeding, you will need to stay in the hospital for observation. HOME CARE INSTRUCTIONS   Take any medicines exactly as prescribed.  Keep your stools soft by eating foods that are high in fiber. These foods include whole grains, legumes, fruits, and vegetables. Prunes (1 to 3 a day) work well for many people.  Drink enough fluids to keep your urine clear or pale yellow. SEEK IMMEDIATE MEDICAL CARE IF:   Your bleeding increases.  You feel lightheaded, weak, or you faint.  You have severe cramps in your back or abdomen.  You pass large blood clots in your stool.  Your problems are getting worse. MAKE SURE YOU:   Understand these instructions.  Will watch your condition.  Will get help right away if you are not doing well or get worse.   This information is not intended to replace advice given to you by your health care provider. Make sure you discuss any questions you have with your health care provider.   Document Released: 01/04/2000 Document Revised: 12/24/2011 Document Reviewed: 06/26/2014 Elsevier Interactive Patient Education 2016 [place] Kidney Disease The kidneys are two organs that lie on either side of the spine between the middle of the back and the front of the abdomen. The kidneys:   Remove wastes and extra water from the blood.   Produce important hormones. These  help keep bones strong, regulate blood pressure, and help create red blood cells.   Balance the fluids and chemicals in the blood and tissues. End-stage kidney disease occurs when the kidneys are so damaged that they cannot do their job.  When the kidneys cannot do their job, life-threatening problems occur. The body cannot stay clean and strong without the help of the kidneys. In end-stage kidney disease, the kidneys cannot get better.You need a new kidney or treatments to do some of the work healthy kidneys do in order to stay alive. CAUSES  End-stage kidney disease usually occurs when a long-lasting (chronic) kidney disease gets worse. It may also occur after the kidneys are suddenly damaged (acute kidney injury).  SYMPTOMS   Swelling (edema) of the legs, ankles, or feet.   Tiredness (lethargy).   Nausea or vomiting.   Confusion.   Problems with urination, such as:   Decreased urine production.   Frequent urination, especially at night.   Frequent accidents in children who are potty trained.   Muscle twitches and cramps.   Persistent itchiness.   Loss of appetite.   Headaches.   Abnormally dark or light skin.   Numbness in the hands or feet.   Easy bruising.   Frequent hiccups.   Menstruation stops. DIAGNOSIS  Your health care provider will measure your blood pressure and take some tests. These may include:   Urine tests.   Blood tests.   Imaging tests, such as:   An ultrasound exam.   Computed tomography (CT).  A kidney biopsy. TREATMENT  There are two treatments for end-stage kidney disease:   A procedure that removes toxic wastes from the body (dialysis).   Receiving a new kidney (kidney transplant). Both of these treatments have serious risks and consequences. Your health care provider will help you determine which treatment is best for you based on your health, age, and other factors. In addition to having dialysis or a kidney transplant, you may need to take medicines to control high blood pressure (hypertension) and cholesterol and to decrease phosphorus levels in your blood.  HOME CARE INSTRUCTIONS  Follow your prescribed diet.   Take medicines only as  directed by your health care provider.   Do not take any new medicines (prescription, over-the-counter, or nutritional supplements) unless approved by your health care provider. Many medicines can worsen your kidney damage or need to have the dose adjusted.   Keep all follow-up visits as directed by your health care provider. MAKE SURE YOU:  Understand these instructions.  Will watch your condition.  Will get help right away if you are not doing well or get worse.   This information is not intended to replace advice given to you by your health care provider. Make sure you discuss any questions you have with your health care provider.   Document Released: 03/29/2003 Document Revised: 01/27/2014 Document Reviewed: 09/05/2011 Elsevier Interactive Patient Education 2016 [employer].  Pain Medicine Instructions  HOW CAN PAIN MEDICINE AFFECT ME?    You were given a prescription for pain medicine. This medicine may make you tired or drowsy and may affect your ability to think clearly. Pain medicine may also affect your ability to drive or perform certain physical activities. It may not be possible to make all of your pain go away, but you should be comfortable enough to move, breathe, and take care of yourself.  HOW OFTEN SHOULD I TAKE PAIN MEDICINE AND HOW MUCH SHOULD I TAKE?  Take pain medicine  only as directed by your health care provider and only as needed for pain.  You do not need to take pain medicine if you are not having pain, unless directed by your health care provider.  You can take less than the prescribed dose if you find that a smaller amount of medicine controls your pain. WHAT RESTRICTIONS DO I HAVE WHILE TAKING PAIN MEDICINE?  Follow these instructions after you start taking pain medicine, while you are taking the medicine, and for 8 hours after you stop taking the medicine:  Do not drive.  Do not operate machinery.  Do not operate power tools.  Do not sign legal  documents.  Do not drink alcohol.  Do not take sleeping pills.  Do not supervise children by yourself.  Do not participate in activities that require climbing or being in high places.  Do not enter a body of water--such as a lake, river, ocean, spa, or swimming pool--without an adult nearby who can monitor and help you. HOW CAN I KEEP OTHERS SAFE WHILE I AM TAKING PAIN MEDICINE?  Store your pain medicine as directed by your health care provider. Make sure that it is placed where children and pets cannot reach it.  Never share your pain medicine with anyone.  Do not save any leftover pills. If you have any leftover pain medicine, get rid of it or destroy it as directed by your health care provider. WHAT ELSE DO I NEED TO KNOW ABOUT TAKING PAIN MEDICINE?  Use a stool softener if you become constipated from your pain medicine. Increasing your intake of fruits and vegetables will also help with constipation.  Write down the times when you take your pain medicine. Look at the times before you take your next dose of medicine. It is easy to become confused while on pain medicine. Recording the times helps you to avoid an overdose.  If your pain is severe, do not try to treat it yourself by taking more pills than instructed on your prescription. Contact your health care provider for help.  You may have been prescribed a pain medicine that contains acetaminophen. Do not take any other acetaminophen while taking this medicine. An overdose of acetaminophen can result in severe liver damage. Acetaminophen is found in many over-the-counter (OTC) and prescription medicines. If you are taking any medicines in addition to your pain medicine, check the active ingredients on those medicines to see if acetaminophen is listed. WHEN SHOULD I CALL MY HEALTH CARE PROVIDER?  Your medicine is not helping to make the pain go away.  You vomit or have diarrhea shortly after taking the medicine.  You develop new pain in areas  that did not hurt before.  You have an allergic reaction to your medicine. This may include:  Itchiness.  Swelling.  Dizziness.  Developing a new rash. WHEN SHOULD I CALL 911 OR GO TO THE EMERGENCY ROOM?  You feel dizzy or you faint.  You are very confused or disoriented.  You repeatedly vomit.  Your skin or lips turn pale or bluish in color.  You have shortness of breath or you are breathing much more slowly than usual.  You have a severe allergic reaction to your medicine. This includes:  Developing tongue swelling.  Having difficulty breathing. This information is not intended to replace advice given to you by your health care provider. Make sure you discuss any questions you have with your health care provider.  Document Released: 04/14/2000 Document Revised: 05/23/2014 Document Reviewed: 11/10/2013  Chartered certified accountant Patient Education [employer].

## 2015-06-28 NOTE — Discharge Summary (Signed)
Physician Discharge Summary  Eddie Thomas  HKV:[id number]  DOB: 1945-03-30  DOA: 06/16/2015  PCP: [name]  Admit date: 06/16/2015 Discharge date: 06/28/2015  Time spent: Greater than 30 minutes  Recommendations for Outpatient Follow-up:  1. Iva Lento, PA-C/PCP 2. Dr. Curt Bears, Oncology as needed.  Discharge Diagnoses:  Principal Problem:   SIRS (systemic inflammatory response syndrome) (HCC) Active Problems:   Multiple myeloma (HCC)   Diabetes mellitus (HCC)   Antineoplastic chemotherapy induced anemia   Hypoalbuminemia due to protein-calorie malnutrition (HCC)   Generalized weakness   ESRD on hemodialysis (HCC)   Nausea and vomiting   Aspiration pneumonitis (HCC)   Pyrexia   Protein-calorie malnutrition, severe   FUO (fever of unknown origin)   Hematemesis with nausea   Dysphagia, pharyngoesophageal phase   Palliative care encounter   Goals of care, counseling/discussion   Insomnia   Encounter for hospice care discussion   Hiccups   Discharge Condition: Guarded and at high risk for rapid decline and death.  Diet recommendation: Liquid diet.  Filed Weights   06/27/15 2100 06/28/15 0707 06/28/15 1120  Weight: 72.4 kg (159 lb 9.8 oz) 65.7 kg (144 lb 13.5 oz) 65.2 kg (143 lb 11.8 oz)    History of present illness:  70 y.o. male with medical history significant of multiple myeloma, IgA subtype, candidal esophagitis with stricture dilated in late April by Dr. Fuller Plan, ESRD, hypertension, diabetes mellitus, hx of DVT no longer on anticoagulation due to bleeding complications. He was recently hospitalized in Mid-May for acute renal failure likely secondary to progressive multiple myeloma. He was started on dialysis and deemed ESRD. He was discharged, but presented a few days later with fever, nausea and vomiting. He likely had aspiration pneumonia and may have some malignancy related fevers superimposed. He has had rapidly worsening anemia  requiring more frequent blood transfusions. Blood transfusions are complicated by his multiple myeloma and by the medications he has received to treat his myeloma which make finding matching blood difficult (takes several days to acquire blood). Although his myeloma is contributing to his pancytopenia, his anemia is further worsened by his hematemesis. He underwent EGD on 6/1 which demonstrated hemorrhagic esophagitis. Despite sitting upright and using PPI he continues to have ongoing melena and rapid anemia. Due to his progressive cancer, prolonged hospitalizations, and refractory nausea/vomiting, he has become cachectic and frail with severe protein calorie malnutrition.Patient and family are amenable to hospice care.Initial plans was for him to go home with hospice tomorrow but today was informed by Hospice team that patient wants to go home today.  Palliative team strongly recommended that he be transitioned to hospice house quickly - as he is likely to have a traumatic bleed at home. The family is insistent he go home for at least a day or two to be able to "take care of some things"  Hospital Course:   SIRS/Fever, suspect aspiration pneumonia given his frequent hematemesis vs neoplastic fever from widespread myeloma. Completed 8-days of treatment for aspiration pneumonia - Fevers improved.  Anemia of neoplastic disease and acute blood loss. Having melena now and hemoglobin dropping more rapidly. Received two units PRBC's. Multiple antibodies related to his myeloma medications make finding compatible blood difficult. Overall Hemoglobin dropping more rapidly now and having ongoing melena.  - Hemoglobin did not increase appropriately with 2 units of blood on 6/6. Hb rechecked this morning at HD (initially reported as 4.6) and confirmed at 8.8.  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 Underwent HD this morning. Received call  from hospice team indicating that patient and family wished to go home after dialysis this afternoon and hospice team will be able to meet with them at home later this afternoon. Updated nephrology service and they indicated that patient will not continue HD post discharge.  Grade D reflux esophagitis. Hematemesis improving, melena ongoing - EGD on 6/1 found hemorrhagic esophagitis - pathology from 6/1 esophageal biopsies with noninfectious ulceration, no malignancy detected - continue full liquid diet and do not advance - PPI  - Appreciate GI assistance   Multiple Myeloma, progressing with worsening anemia, new ESRD, rising B2 microglobulin, multiple lytic lesions - refractory to multiple chemo regimens and SCT in 2012 - Dr. Julien Nordmann had family meeting on 6/2 with patient, wife, and daughter [name] and recommended hospice care - Palliative care assistance appreciated - transitioning to home hospice today  Acute toxic and metabolic encephalopathy, resolved, likely multifactorial including infection, medications, worsening anemia -06/12/15 CT brain negative -ammonia--24,  -vitamin B12--266 > started supplementation -TSH--0.493, Free T4--1.04 -Discontinued Thorazine, baclofen, opioids, gabapentin, other hypnotics  Atypical Chest Pain, likely due to esophageal stricture and dilation -VQ scan neg -EKG-no concerning ischemic changes -troponins minimally elevated due to ESRD  Pancytopenia, platelets dropping with ongoing bleeding - Secondary to multiple myeloma and related cancer chemotherapy. - transfused 1 unit of platelets  NSVT: - Patient had 15 beats of nonsustained V. tach, hypomagnesemia corrected,  - 2-D echo: Normal EF - d/c'd metoprolol due to hypotension  History of DVT - Xarelto DC'ed given renal function (took Xarelto approximately one year) - Not good anticoagulation candidate due to bleeding complications.   Essential hypertension, blood pressures decreasing and  intermittently hypotensive - ACEI stopped due to acute renal failure.   Diabetes mellitus type 2, CBG well-controlled, requiring minimal insulin and at risk for hypoglycemia  Severe protein calorie malnutrition - continue full liquid diet - continue supplements  DO NOT RESUSCITATE   Consultants:   GI  Nephrology  Vascular surgery  Oncology  Palliative care  Procedures:  Hemodialysis.   Discharge Exam:  Complaints:  Seen at hemodialysis this morning. Denied complaints. No pain, dyspnea, hiccups, nausea or vomiting. Does indicate ongoing "dark" stools.  Filed Vitals:   06/28/15 1030 06/28/15 1100 06/28/15 1120 06/28/15 1147  BP: 137/64 129/59 142/62 124/48  Pulse: 74 73 73 79  Temp:   98.6 F (37 C) 98.7 F (37.1 C)  TempSrc:   Oral Oral  Resp: 17 18 18 18   Height:      Weight:   65.2 kg (143 lb 11.8 oz)   SpO2:    100%    General exam: Cachectic, pale, ill-appearing adult male. Appears comfortable and undergoing HD this morning. HEENT: NCAT, MMM Respiratory system: CTAB. No increased work of breathing. Cardiovascular system: S1 and S2 heard,, regular rhythm. 2/6 systolic murmurs. Warm extremities Gastrointestinal system: Normal active bowel sounds, soft, nondistended, nontender MSK: Decreased tone and bulk, no lower extremity edema. Neuro: Grossly moves all extremities. Alert and oriented. No focal neurological deficits.  Discharge Instructions      Discharge Instructions    Call MD for:  difficulty breathing, headache or visual disturbances    Complete by:  As directed      Call MD for:  extreme fatigue    Complete by:  As directed      Call MD for:  persistant dizziness or light-headedness    Complete by:  As directed  Call MD for:  persistant nausea and vomiting    Complete by:  As directed      Call MD for:  severe uncontrolled pain    Complete by:  As directed      Call MD for:  temperature >100.4    Complete by:  As directed       Discharge instructions    Complete by:  As directed   DIET: Liquid diet.     Increase activity slowly    Complete by:  As directed             Medication List    STOP taking these medications        cyanocobalamin 500 MCG tablet     glipiZIDE 5 MG tablet  Commonly known as:  GLUCOTROL     lidocaine-prilocaine cream  Commonly known as:  EMLA     metoCLOPramide 5 MG tablet  Commonly known as:  REGLAN     metoprolol tartrate 25 MG tablet  Commonly known as:  LOPRESSOR     montelukast 10 MG tablet  Commonly known as:  SINGULAIR     multivitamin Tabs tablet     sevelamer carbonate 800 MG tablet  Commonly known as:  RENVELA     simvastatin 10 MG tablet  Commonly known as:  ZOCOR     valACYclovir 500 MG tablet  Commonly known as:  VALTREX     VIAGRA 50 MG tablet  Generic drug:  sildenafil      TAKE these medications        calcitRIOL 0.25 MCG capsule  Commonly known as:  ROCALTROL  Take 1 capsule (0.25 mcg total) by mouth Every Tuesday,Thursday,and Saturday with dialysis.     chlorproMAZINE 25 MG tablet  Commonly known as:  THORAZINE  Take 1 tablet (25 mg total) by mouth 3 (three) times daily as needed for hiccoughs.     diphenoxylate-atropine 2.5-0.025 MG/5ML liquid  Commonly known as:  LOMOTIL  Take 5 mLs by mouth 4 (four) times daily as needed for diarrhea or loose stools.     feeding supplement (ENSURE ENLIVE) Liqd  Take 237 mLs by mouth 2 (two) times daily between meals.     LORazepam 1 MG tablet  Commonly known as:  ATIVAN  Take 1 tablet (1 mg total) by mouth at bedtime.     LORazepam 1 MG tablet  Commonly known as:  ATIVAN  Take 1 tablet (1 mg total) by mouth every 8 (eight) hours as needed for anxiety (and hiccups not controlled by Thorazine.).     morphine 10 MG/5ML solution  Take 1.3 mLs (2.6 mg total) by mouth every 6 (six) hours as needed for moderate pain or severe pain.     pantoprazole 40 MG tablet  Commonly known as:  PROTONIX   TAKE ONE TABLET BY MOUTH TWICE DAILY     prochlorperazine 10 MG tablet  Commonly known as:  COMPAZINE  Take 10 mg by mouth every 6 (six) hours as needed for nausea or vomiting. Reported on 05/14/2015       Follow-up Information    Schedule an appointment as soon as possible for a visit with WATTERSON,PATRICK, PA-C.   Specialty:  Internal Medicine   Contact information:   [place] [address] [phone]       Schedule an appointment as soon as possible for a visit with Eilleen Kempf., MD.   Specialty:  Oncology   Why:  As needed  Contact information:   [place] [address] [phone]       Get Medicines reviewed and adjusted: Please take all your medications with you for your next visit with your Primary MD  Please request your Primary MD to go over all hospital tests and procedure/radiological results at the follow up. Please ask your Primary MD to get all Hospital records sent to his/her office.  If you experience worsening of your admission symptoms, develop shortness of breath, life threatening emergency, suicidal or homicidal thoughts you must seek medical attention immediately by calling 911 or calling your MD immediately if symptoms less severe.  You must read complete instructions/literature along with all the possible adverse reactions/side effects for all the Medicines you take and that have been prescribed to you. Take any new Medicines after you have completely understood and accept all the possible adverse reactions/side effects.   Do not drive when taking pain medications.   Do not take more than prescribed Pain, Sleep and Anxiety Medications  Special Instructions: If you have smoked or chewed Tobacco in the last 2 yrs please stop smoking, stop any regular Alcohol and or any Recreational drug use.  Wear Seat belts while driving.  Please note  You were cared for by a hospitalist during your hospital stay. Once  you are discharged, your primary care physician will handle any further medical issues. Please note that NO REFILLS for any discharge medications will be authorized once you are discharged, as it is imperative that you return to your primary care physician (or establish a relationship with a primary care physician if you do not have one) for your aftercare needs so that they can reassess your need for medications and monitor your lab values.    The results of significant diagnostics from this hospitalization (including imaging, microbiology, ancillary and laboratory) are listed below for reference.    Significant Diagnostic Studies: Ct Abdomen Pelvis Wo Contrast  06/20/2015  CLINICAL DATA:  70 year old male with history of fever of unknown origin. EXAM: CT CHEST, ABDOMEN AND PELVIS WITHOUT CONTRAST TECHNIQUE: Multidetector CT imaging of the chest, abdomen and pelvis was performed following the standard protocol without IV contrast. COMPARISON:  No priors. FINDINGS: CT CHEST FINDINGS Mediastinum/Lymph Nodes: Heart size is normal. There is no significant pericardial fluid, thickening or pericardial calcification. There is atherosclerosis of the thoracic aorta, the great vessels of the mediastinum and the coronary arteries, including calcified atherosclerotic plaque in the left main, left anterior descending, left circumflex and right coronary arteries. No pathologically enlarged mediastinal or hilar lymph nodes. Please note that accurate exclusion of hilar adenopathy is limited on noncontrast CT scans. Esophagus is dilated in contains a large amount of fluid and/or retained solid food. The possibility of esophageal mass is not entirely excluded. No axillary lymphadenopathy. Left-sided internal jugular single-lumen porta cath with tip terminating at the superior cavoatrial junction. Right-sided internal jugular dialysis catheter with tip terminating in the right atrium. Lungs/Pleura: No suspicious appearing  pulmonary nodules or masses. No acute consolidative airspace disease. No pleural effusions. Minimal dependent subsegmental atelectasis in the lower lobes of the lungs bilaterally. Musculoskeletal/Soft Tissues: There multiple lytic lesions noted throughout the visualized axial and appendicular skeleton, presumably related to the patient's multiple myeloma. The largest of these is in the T6 vertebral body, where there is what appears to be a chronic compression fracture with approximately 40% loss of anterior and 30% loss of posterior vertebral body height. CT ABDOMEN AND PELVIS FINDINGS Hepatobiliary: Multiple small  subcentimeter low-attenuation hepatic lesions are incompletely characterized on today's noncontrast CT examination, but are statistically likely to represent cysts. Unenhanced appearance of the gallbladder is remarkable for some dependent amorphous intermediate attenuation material which likely represents some biliary sludge. No findings to suggest an acute cholecystitis at this time. Pancreas: No pancreatic mass or peripancreatic inflammatory changes identified on today's noncontrast CT examination. Spleen: Unremarkable. Adrenals/Urinary Tract: 1.2 cm high attenuation lesion in the interpolar region of the right kidney is incompletely characterize, but likely to represent a proteinaceous/hemorrhagic cyst. Other low-attenuation lesions in the interpolar and lower pole region of the right kidney are also incompletely characterized, but favored to represent cysts. Unenhanced appearance of the left kidney is normal. Bilateral adrenal glands are normal in appearance. No hydroureteronephrosis. Urinary bladder is nearly completely decompressed, but otherwise unremarkable in appearance. Stomach/Bowel: The unenhanced appearance of the stomach is normal. There is no pathologic dilatation of small bowel or colon. Normal appendix. Vascular/Lymphatic: Atherosclerosis throughout the abdominal and pelvic vasculature,  without evidence of aneurysm. Circumaortic left renal vein (normal anatomical variant) incidentally noted. No lymphadenopathy noted in the abdomen or pelvis on today's noncontrast CT examination. Reproductive: Prostate gland and seminal vesicles are unremarkable in appearance. Other: No significant volume of ascites.  No pneumoperitoneum. Musculoskeletal: Innumerable lytic lesions are noted throughout the visualized axial and appendicular skeleton, compatible with widespread involvement from patient's known multiple myeloma. The largest of these is in the left side of the sacrum measuring approximately 3.6 x 2.7 cm (image 239 of series 7). IMPRESSION: 1. No definite source for fever of unknown origin identified on today's examination. 2. Widespread osseous involvement from the patient's known multiple myeloma, with what appears to be a chronic compression fracture at T6, as above. 3. Markedly dilated esophagus which contains fluid and debris. The possibility of an underlying esophageal mass in this region is not excluded, and correlation with nonemergent esophagram is suggested in the near future to better evaluate these findings. 4. Atherosclerosis, including left main and 3 vessel coronary artery disease. Please note that although the presence of coronary artery calcium documents the presence of coronary artery disease, the severity of this disease and any potential stenosis cannot be assessed on this non-gated CT examination. Assessment for potential risk factor modification, dietary therapy or pharmacologic therapy may be warranted, if clinically indicated. 5. Additional incidental findings, as above. Electronically Signed   By: Vinnie Langton M.D.   On: 06/20/2015 08:27   Dg Chest 2 View  06/18/2015  CLINICAL DATA:  Dyspnea. Chest discomfort 3 hours. Hypertension and diabetes. Dialysis patient. EXAM: CHEST  2 VIEW COMPARISON:  06/16/2015. FINDINGS: Right jugular dual-lumen dialysis catheter in the lower  SVC. Port-A-Cath tip in the SVC. These are unchanged. Negative for pulmonary edema. No infiltrate or effusion. Lungs are clear. IMPRESSION: No active cardiopulmonary disease. Electronically Signed   By: Franchot Gallo M.D.   On: 06/18/2015 18:16   Dg Chest 2 View  06/16/2015  CLINICAL DATA:  Chest pain EXAM: CHEST  2 VIEW COMPARISON:  06/12/2015 FINDINGS: Right-sided dialysis catheter and left-sided chest wall port are again seen and stable. Cardiac shadow is stable. The lungs are well aerated bilaterally without focal infiltrate or sizable effusion. Postsurgical changes in the right humerus are seen. IMPRESSION: No acute abnormality noted. Electronically Signed   By: Inez Catalina M.D.   On: 06/16/2015 20:35   Ct Head Wo Contrast  06/12/2015  CLINICAL DATA:  Confusion. Tearful. Undergoing treatment for multiple myeloma. Altered mental status. EXAM: CT  HEAD WITHOUT CONTRAST TECHNIQUE: Contiguous axial images were obtained from the base of the skull through the vertex without intravenous contrast. COMPARISON:  None. FINDINGS: No acute cortical infarct, hemorrhage, or mass lesion is present. The ventricles are of normal size. No significant extra-axial fluid collection is evident. The paranasal sinuses and mastoid air cells are clear. The calvarium is intact. The globes and orbits are intact. No significant extracranial soft tissue lesion is present. IMPRESSION: Negative CT of the head. Electronically Signed   By: San Morelle M.D.   On: 06/12/2015 14:19   Ct Chest Wo Contrast  06/20/2015  CLINICAL DATA:  70 year old male with history of fever of unknown origin. EXAM: CT CHEST, ABDOMEN AND PELVIS WITHOUT CONTRAST TECHNIQUE: Multidetector CT imaging of the chest, abdomen and pelvis was performed following the standard protocol without IV contrast. COMPARISON:  No priors. FINDINGS: CT CHEST FINDINGS Mediastinum/Lymph Nodes: Heart size is normal. There is no significant pericardial fluid, thickening or  pericardial calcification. There is atherosclerosis of the thoracic aorta, the great vessels of the mediastinum and the coronary arteries, including calcified atherosclerotic plaque in the left main, left anterior descending, left circumflex and right coronary arteries. No pathologically enlarged mediastinal or hilar lymph nodes. Please note that accurate exclusion of hilar adenopathy is limited on noncontrast CT scans. Esophagus is dilated in contains a large amount of fluid and/or retained solid food. The possibility of esophageal mass is not entirely excluded. No axillary lymphadenopathy. Left-sided internal jugular single-lumen porta cath with tip terminating at the superior cavoatrial junction. Right-sided internal jugular dialysis catheter with tip terminating in the right atrium. Lungs/Pleura: No suspicious appearing pulmonary nodules or masses. No acute consolidative airspace disease. No pleural effusions. Minimal dependent subsegmental atelectasis in the lower lobes of the lungs bilaterally. Musculoskeletal/Soft Tissues: There multiple lytic lesions noted throughout the visualized axial and appendicular skeleton, presumably related to the patient's multiple myeloma. The largest of these is in the T6 vertebral body, where there is what appears to be a chronic compression fracture with approximately 40% loss of anterior and 30% loss of posterior vertebral body height. CT ABDOMEN AND PELVIS FINDINGS Hepatobiliary: Multiple small subcentimeter low-attenuation hepatic lesions are incompletely characterized on today's noncontrast CT examination, but are statistically likely to represent cysts. Unenhanced appearance of the gallbladder is remarkable for some dependent amorphous intermediate attenuation material which likely represents some biliary sludge. No findings to suggest an acute cholecystitis at this time. Pancreas: No pancreatic mass or peripancreatic inflammatory changes identified on today's noncontrast  CT examination. Spleen: Unremarkable. Adrenals/Urinary Tract: 1.2 cm high attenuation lesion in the interpolar region of the right kidney is incompletely characterize, but likely to represent a proteinaceous/hemorrhagic cyst. Other low-attenuation lesions in the interpolar and lower pole region of the right kidney are also incompletely characterized, but favored to represent cysts. Unenhanced appearance of the left kidney is normal. Bilateral adrenal glands are normal in appearance. No hydroureteronephrosis. Urinary bladder is nearly completely decompressed, but otherwise unremarkable in appearance. Stomach/Bowel: The unenhanced appearance of the stomach is normal. There is no pathologic dilatation of small bowel or colon. Normal appendix. Vascular/Lymphatic: Atherosclerosis throughout the abdominal and pelvic vasculature, without evidence of aneurysm. Circumaortic left renal vein (normal anatomical variant) incidentally noted. No lymphadenopathy noted in the abdomen or pelvis on today's noncontrast CT examination. Reproductive: Prostate gland and seminal vesicles are unremarkable in appearance. Other: No significant volume of ascites.  No pneumoperitoneum. Musculoskeletal: Innumerable lytic lesions are noted throughout the visualized axial and appendicular skeleton, compatible with widespread  involvement from patient's known multiple myeloma. The largest of these is in the left side of the sacrum measuring approximately 3.6 x 2.7 cm (image 239 of series 7). IMPRESSION: 1. No definite source for fever of unknown origin identified on today's examination. 2. Widespread osseous involvement from the patient's known multiple myeloma, with what appears to be a chronic compression fracture at T6, as above. 3. Markedly dilated esophagus which contains fluid and debris. The possibility of an underlying esophageal mass in this region is not excluded, and correlation with nonemergent esophagram is suggested in the near future  to better evaluate these findings. 4. Atherosclerosis, including left main and 3 vessel coronary artery disease. Please note that although the presence of coronary artery calcium documents the presence of coronary artery disease, the severity of this disease and any potential stenosis cannot be assessed on this non-gated CT examination. Assessment for potential risk factor modification, dietary therapy or pharmacologic therapy may be warranted, if clinically indicated. 5. Additional incidental findings, as above. Electronically Signed   By: Vinnie Langton M.D.   On: 06/20/2015 08:27   US Renal  06/01/2015  CLINICAL DATA:  Acute kidney injury, diabetes mellitus, hypertension and EXAM: RENAL / URINARY TRACT ULTRASOUND COMPLETE COMPARISON:  Abdominal ultrasound 03/13/2015 FINDINGS: Right Kidney: Length: 11.8 cm. Normal cortical thickness. Increased cortical echogenicity. No hydronephrosis or shadowing calcification. Two cysts identified at mid RIGHT kidney 1.6 x 1.4 x 1.5 cm and at the mid inferior pole 2.9 x 2.5 x 2.4 cm, smaller lesion demonstrating scattered internal echogenicity/complication. Left Kidney: Length: 13.9 cm. Normal cortical thickness. Increased cortical echogenicity. No mass, hydronephrosis or shadowing calcification. Bladder: Partially distended, unremarkable. IMPRESSION: Medical renal disease changes of both kidneys. 2 RIGHT renal cysts, larger 2.9 cm greatest size simple in character with smaller lesion 1.6 cm greatest size complicated by scattered internal echogenicity. Either followup ultrasound in 4-6 months to demonstrate stability or characterization by followup non emergent MRI after discharge when patient can optimally cooperate with MR imaging recommended to exclude cystic renal neoplasm. Electronically Signed   By: Lavonia Dana M.D.   On: 06/01/2015 13:46   Nm Pulmonary Perf And Vent  06/14/2015  CLINICAL DATA:  70 year old male with chest pain and shortness of breath. History of  myeloma and prior DVT. EXAM: NUCLEAR MEDICINE VENTILATION - PERFUSION LUNG SCAN TECHNIQUE: Ventilation images were obtained in multiple projections using inhaled aerosol Tc-27mDTPA. Perfusion images were obtained in multiple projections after intravenous injection of Tc-979mAA. RADIOPHARMACEUTICALS:  31.0 mCi Technetium-9950mPA aerosol inhalation and 4.2 mCi Technetium-6m61m IV COMPARISON:  06/12/2015 chest radiograph. FINDINGS: Ventilation: No focal ventilation defect. Perfusion: No wedge shaped peripheral perfusion defects to suggest acute pulmonary embolism. IMPRESSION: No evidence of acute pulmonary embolus. Electronically Signed   By: JeffMargarette Canada.   On: 06/14/2015 16:59   Ir Fluoro Guide Cv Line Right  06/05/2015  INDICATION: Acute kidney injury, multiple myeloma, renal failure EXAM: ULTRASOUND GUIDANCE FOR VASCULAR ACCESS RIGHT INTERNAL JUGULAR PERMANENT HEMODIALYSIS CATHETER Date:  5/16/20175/16/2017 3:53 pm Radiologist:  M. TrevDaryll Brod Guidance:  Ultrasound and fluoroscopic FLUOROSCOPY TIME:  Fluoroscopy Time:  30 seconds (2 mGy). MEDICATIONS: 1% lidocaine locally, 2 g Ancef administered within 1 hour of the procedure ANESTHESIA/SEDATION: Versed 0.5 mg IV; Fentanyl 25 mcg IV; Moderate Sedation Time:  15 minutes The patient was continuously monitored during the procedure by the interventional radiology nurse under my direct supervision. CONTRAST:  None COMPLICATIONS: None immediate. PROCEDURE: Informed consent was obtained from the  patient following explanation of the procedure, risks, benefits and alternatives. The patient understands, agrees and consents for the procedure. All questions were addressed. A time out was performed. Maximal barrier sterile technique utilized including caps, mask, sterile gowns, sterile gloves, large sterile drape, hand hygiene, and 2% chlorhexidine scrub. Under sterile conditions and local anesthesia, right internal jugular micropuncture venous access was  performed with ultrasound. Images were obtained for documentation of the patent right internal jugular vein. A guide wire was inserted followed by a transitional dilator. Next, a 0.035 guidewire was advanced into the IVC with a 5-French catheter. Measurements were obtained from the right venotomy site to the proximal right atrium. In the right infraclavicular chest, a subcutaneous tunnel was created under sterile conditions and local anesthesia. 1% lidocaine with epinephrine was utilized for this. The 19 cm tip to cuff palindrome catheter was tunneled subcutaneously to the venotomy site and inserted into the SVC/RA junction through a valved peel-away sheath. Position was confirmed with fluoroscopy. Images were obtained for documentation. Blood was aspirated from the catheter followed by saline and heparin flushes. The appropriate volume and strength of heparin was instilled in each lumen. Caps were applied. The catheter was secured at the tunnel site with Gelfoam and a pursestring suture. The venotomy site was closed with subcuticular Vicryl suture. Dermabond was applied to the small right neck incision. A dry sterile dressing was applied. The catheter is ready for use. No immediate complications. IMPRESSION: Ultrasound and fluoroscopically guided right internal jugular tunneled hemodialysis catheter (19 cm tip to cuff palindrome catheter). Electronically Signed   By: Jerilynn Mages.  Shick M.D.   On: 06/05/2015 16:34   Ir US Guide Vasc Access Right  06/05/2015  INDICATION: Acute kidney injury, multiple myeloma, renal failure EXAM: ULTRASOUND GUIDANCE FOR VASCULAR ACCESS RIGHT INTERNAL JUGULAR PERMANENT HEMODIALYSIS CATHETER Date:  5/16/20175/16/2017 3:53 pm Radiologist:  M. Daryll Brod, MD Guidance:  Ultrasound and fluoroscopic FLUOROSCOPY TIME:  Fluoroscopy Time:  30 seconds (2 mGy). MEDICATIONS: 1% lidocaine locally, 2 g Ancef administered within 1 hour of the procedure ANESTHESIA/SEDATION: Versed 0.5 mg IV; Fentanyl 25  mcg IV; Moderate Sedation Time:  15 minutes The patient was continuously monitored during the procedure by the interventional radiology nurse under my direct supervision. CONTRAST:  None COMPLICATIONS: None immediate. PROCEDURE: Informed consent was obtained from the patient following explanation of the procedure, risks, benefits and alternatives. The patient understands, agrees and consents for the procedure. All questions were addressed. A time out was performed. Maximal barrier sterile technique utilized including caps, mask, sterile gowns, sterile gloves, large sterile drape, hand hygiene, and 2% chlorhexidine scrub. Under sterile conditions and local anesthesia, right internal jugular micropuncture venous access was performed with ultrasound. Images were obtained for documentation of the patent right internal jugular vein. A guide wire was inserted followed by a transitional dilator. Next, a 0.035 guidewire was advanced into the IVC with a 5-French catheter. Measurements were obtained from the right venotomy site to the proximal right atrium. In the right infraclavicular chest, a subcutaneous tunnel was created under sterile conditions and local anesthesia. 1% lidocaine with epinephrine was utilized for this. The 19 cm tip to cuff palindrome catheter was tunneled subcutaneously to the venotomy site and inserted into the SVC/RA junction through a valved peel-away sheath. Position was confirmed with fluoroscopy. Images were obtained for documentation. Blood was aspirated from the catheter followed by saline and heparin flushes. The appropriate volume and strength of heparin was instilled in each lumen. Caps were applied. The catheter was  secured at the tunnel site with Gelfoam and a pursestring suture. The venotomy site was closed with subcuticular Vicryl suture. Dermabond was applied to the small right neck incision. A dry sterile dressing was applied. The catheter is ready for use. No immediate complications.  IMPRESSION: Ultrasound and fluoroscopically guided right internal jugular tunneled hemodialysis catheter (19 cm tip to cuff palindrome catheter). Electronically Signed   By: Jerilynn Mages.  Shick M.D.   On: 06/05/2015 16:34   Dg Chest Port 1 View  06/12/2015  CLINICAL DATA:  One day of fever, history of multiple myeloma, bone marrow transplant with complication, dialysis dependent renal failure. EXAM: PORTABLE CHEST 1 VIEW COMPARISON:  No recent chest x-ray in PACs comparison is made to a chest x-ray of November 21, 2011 FINDINGS: The lungs are adequately inflated. There is no focal infiltrate. There is no pleural effusion or pneumothorax. A dialysis catheter is in place via the right internal jugular approach with the tip projecting over the midportion of the SVC. A Port-A-Cath appliance is present with its tip projecting over the midportion of the SVC. The heart is normal in size. The pulmonary vascularity is not engorged. The bony thorax exhibits no acute abnormality. The patient has undergone previous plate and screw fixation for proximal humeral fracture. IMPRESSION: There is no evidence of pneumonia. No acute cardiopulmonary abnormality is observed. Electronically Signed   By: David  Martinique M.D.   On: 06/12/2015 16:50   Dg Abd Portable 1v  06/09/2015  CLINICAL DATA:  70 year old with acute nausea and vomiting. EXAM: PORTABLE ABDOMEN - 1 VIEW COMPARISON:  None. FINDINGS: Bowel gas pattern unremarkable without evidence of obstruction or significant ileus. Expected stool burden in the colon. Phleboliths low in the left side of the pelvis. No visible opaque urinary tract calculi. Degenerative changes involving the lumbar spine and sacroiliac joints. IMPRESSION: No acute abdominal abnormality. Electronically Signed   By: Evangeline Dakin M.D.   On: 06/09/2015 22:23    Microbiology: No results found for this or any previous visit (from the past 240 hour(s)).   Labs: Basic Metabolic Panel:  Recent Labs Lab  06/24/15 0643 06/25/15 0548 06/26/15 0525 06/27/15 0950 06/28/15 0747  NA 141 136 136 136 135  K 4.0 3.9 4.3 4.2 4.1  CL 103 101 102 101 101  CO2 27 26 23 26 24   GLUCOSE 146* 87 124* 97 80  BUN 22* 28* 34* 14 18  CREATININE 4.18* 6.13* 7.69* 5.05* 6.93*  CALCIUM 8.0* 8.0* 8.0* 7.2* 7.3*  PHOS  --   --   --   --  4.3   Liver Function Tests:  Recent Labs Lab 06/28/15 0747  ALBUMIN 1.7*   No results for input(s): LIPASE, AMYLASE in the last 168 hours. No results for input(s): AMMONIA in the last 168 hours. CBC:  Recent Labs Lab 06/25/15 0548 06/26/15 0525 06/27/15 0950 06/28/15 0500 06/28/15 0747  WBC 2.7* 2.7* 2.8* 1.3* 2.4*  NEUTROABS  --   --   --   --  1.9  HGB 7.4* 7.6* 8.9* 4.6* 8.8*  HCT 22.3* 23.6* 28.3* 14.8* 28.4*  MCV 78.8 79.7 79.7 79.6 78.5  PLT 48* 63* 34* 27* 56*   Cardiac Enzymes: No results for input(s): CKTOTAL, CKMB, CKMBINDEX, TROPONINI in the last 168 hours. BNP: BNP (last 3 results) No results for input(s): BNP in the last 8760 hours.  ProBNP (last 3 results) No results for input(s): PROBNP in the last 8760 hours.  CBG:  Recent Labs Lab 06/26/15 1255  06/26/15 1650 06/27/15 0756 06/27/15 1800 06/28/15 1145  GLUCAP 84 145* 104* 160* 77       Signed:  Amilcar Reever, MD, FACP, FHM. [facility] Pager [phone]  If 7PM-7AM, please contact night-coverage [email or web address] Password [email or web address] 06/28/2015, 12:45 PM

## 2015-06-28 NOTE — Procedures (Signed)
I was present at this dialysis session. I have reviewed the session itself and made appropriate changes.   TDC.  Qb 400.  0.5L UF goal.  For transition to hospice after HD today, no further HD planned.    Recent Labs Lab 06/28/15 0747  NA 135  K 4.1  CL 101  CO2 24  GLUCOSE 80  BUN 18  CREATININE 6.93*  CALCIUM 7.3*  PHOS 4.3     Recent Labs Lab 06/27/15 0950 06/28/15 0500 06/28/15 0747  WBC 2.8* 1.3* 2.4*  NEUTROABS  --   --  1.9  HGB 8.9* 4.6* 8.8*  HCT 28.3* 14.8* 28.4*  MCV 79.7 79.6 78.5  PLT 34* 27* PENDING    Scheduled Meds: . sodium chloride   Intravenous Once  . sodium chloride   Intravenous Once  . sodium chloride   Intravenous Once  . calcitRIOL  0.25 mcg Oral Q T,Th,Sa-HD  . darbepoetin (ARANESP) injection - DIALYSIS  100 mcg Intravenous Q Tue-HD  . diphenoxylate-atropine  5 mL Oral QID  . feeding supplement (ENSURE ENLIVE)  237 mL Oral TID BM  . LORazepam  1 mg Oral QHS  . ondansetron (ZOFRAN) IV  8 mg Intravenous Q6H  . pantoprazole  40 mg Oral BID  . sodium chloride flush  3 mL Intravenous Q12H   Continuous Infusions:  PRN Meds:.sodium chloride, sodium chloride, acetaminophen **OR** acetaminophen, alteplase, chlorproMAZINE (THORAZINE) IV, heparin, lidocaine (PF), lidocaine-prilocaine, LORazepam, morphine, ondansetron **OR** ondansetron (ZOFRAN) IV, pentafluoroprop-tetrafluoroeth, sodium chloride flush   Pearson Grippe  MD 06/28/2015, 8:36 AM

## 2015-06-29 ENCOUNTER — Telehealth: Payer: Self-pay | Admitting: *Deleted

## 2015-06-29 NOTE — Telephone Encounter (Signed)
Call from patient's daughter Dewaine Oats.  "Could you ask Dr. Julien Nordmann to call me at [phone].  Dad has in his mind he is to continue Dialysis.  He has dialysis every Tu/Th/Sat.  Was discharged yesterday under Hospice.  I'll tell him I called and left this request."

## 2015-06-30 NOTE — Telephone Encounter (Signed)
I called and spoke to her. Advised against dialysis.

## 2015-07-04 ENCOUNTER — Telehealth: Payer: Self-pay | Admitting: *Deleted

## 2015-07-04 ENCOUNTER — Telehealth: Payer: Self-pay | Admitting: Internal Medicine

## 2015-07-04 ENCOUNTER — Other Ambulatory Visit: Payer: Medicare Other

## 2015-07-04 ENCOUNTER — Ambulatory Visit: Payer: Medicare Other | Admitting: Internal Medicine

## 2015-07-04 NOTE — Telephone Encounter (Signed)
Called pt's home# spoke with Male, discussed pt has an appt for lab and MD today but it is not need to come in as he was d/c to hospice on last admission. She thanked me for the call and when I asked how Mr Sasse was doing she only sighed saying "well...." No further concerns at this time. POF to scheduling to cancel all future appts

## 2015-07-04 NOTE — Telephone Encounter (Signed)
Apt cancelled.. Pt discharged to hospice

## 2015-07-05 ENCOUNTER — Other Ambulatory Visit: Payer: Self-pay | Admitting: *Deleted

## 2015-07-05 DIAGNOSIS — N186 End stage renal disease: Secondary | ICD-10-CM

## 2015-07-05 DIAGNOSIS — Z4931 Encounter for adequacy testing for hemodialysis: Secondary | ICD-10-CM

## 2015-07-06 ENCOUNTER — Telehealth: Payer: Self-pay | Admitting: *Deleted

## 2015-07-06 NOTE — Telephone Encounter (Signed)
Call from Hospice, pt expired today at 0816.

## 2015-07-10 ENCOUNTER — Telehealth: Payer: Self-pay | Admitting: Internal Medicine

## 2015-07-10 NOTE — Telephone Encounter (Signed)
Received death certificate in [facility]. Patient status changed to deceased.

## 2015-07-11 ENCOUNTER — Encounter: Payer: Self-pay | Admitting: Medical Oncology

## 2015-07-11 NOTE — Progress Notes (Signed)
Death Certificate handed to [employer]

## 2015-07-16 ENCOUNTER — Encounter (HOSPITAL_COMMUNITY): Payer: Medicare Other

## 2015-07-17 ENCOUNTER — Encounter: Payer: Medicare Other | Admitting: Vascular Surgery

## 2015-07-21 DEATH — deceased

## 2016-01-04 ENCOUNTER — Other Ambulatory Visit: Payer: Self-pay | Admitting: Nurse Practitioner

## 2017-04-25 IMAGING — NM NM PULMONARY VENT & PERF
16 series · 16 of 16 positions shown · non-contrast
Comparison: 06/12/2015 chest radiograph.

CLINICAL DATA: 69-year-old male with chest pain and shortness of
breath. History of myeloma and prior DVT.

EXAM:
NUCLEAR MEDICINE VENTILATION - PERFUSION LUNG SCAN
TECHNIQUE: Ventilation images were obtained in multiple projections using
inhaled aerosol 9c-55m DTPA. Perfusion images were obtained in
multiple projections after intravenous injection of 9c-55m MAA.
RADIOPHARMACEUTICALS:  31.0 mCi 9echnetium-99m DTPA aerosol
inhalation and 4.2 mCi 9echnetium-99m MAA IV

[Series 1: ant/post vent · 4.14mm/px · 1 of 1 slices shown (1 of 2)]
[im 1/1]
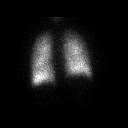

[Series 1: ant/post vent · 4.14mm/px · 1 of 1 slices shown (2 of 2)]
[im 1/1]
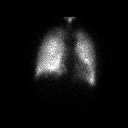

[Series 2: lao/rpo vent · 4.14mm/px · 1 of 1 slices shown (1 of 2)]
[im 1/1]
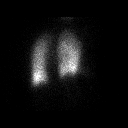

[Series 2: lao/rpo vent · 4.14mm/px · 1 of 1 slices shown (2 of 2)]
[im 1/1]
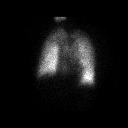

[Series 3: lpo/rao vent · 4.14mm/px · 1 of 1 slices shown (1 of 2)]
[im 1/1]
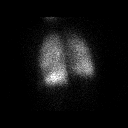

[Series 3: lpo/rao vent · 4.14mm/px · 1 of 1 slices shown (2 of 2)]
[im 1/1]
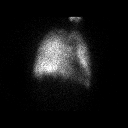

[Series 4: lt lat/rt lat vent · 4.14mm/px · 1 of 1 slices shown (1 of 2)]
[im 1/1]
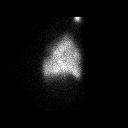

[Series 4: lt lat/rt lat vent · 4.14mm/px · 1 of 1 slices shown (2 of 2)]
[im 1/1]
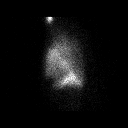

[Series 5: lt lat/rt lat perf · 4.14mm/px · 1 of 1 slices shown (1 of 2)]
[im 1/1]
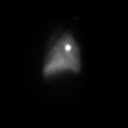

[Series 5: lt lat/rt lat perf · 4.14mm/px · 1 of 1 slices shown (2 of 2)]
[im 1/1]
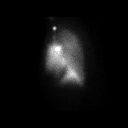

[Series 6: lpo/rao perf · 4.14mm/px · 1 of 1 slices shown (1 of 2)]
[im 1/1]
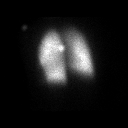

[Series 6: lpo/rao perf · 4.14mm/px · 1 of 1 slices shown (2 of 2)]
[im 1/1]
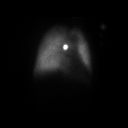

[Series 7: ant/post perf · 4.14mm/px · 1 of 1 slices shown (1 of 2)]
[im 1/1]
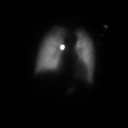

[Series 7: ant/post perf · 4.14mm/px · 1 of 1 slices shown (2 of 2)]
[im 1/1]
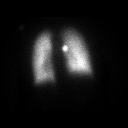

[Series 8: lao/rpo perf · 4.14mm/px · 1 of 1 slices shown (1 of 2)]
[im 1/1]
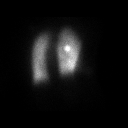

[Series 8: lao/rpo perf · 4.14mm/px · 1 of 1 slices shown (2 of 2)]
[im 1/1]
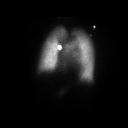

[16 of 16 positions shown; findings below may reference images not displayed]

FINDINGS: Ventilation: No focal ventilation defect.

Perfusion: No wedge shaped peripheral perfusion defects to suggest
acute pulmonary embolism.
IMPRESSION: No evidence of acute pulmonary embolus.

## 2018-05-31 ENCOUNTER — Encounter
